# Patient Record
Sex: Female | Born: 1940 | Race: White | Hispanic: No | Marital: Married | Smoking: Never smoker
Health system: Southern US, Community
[De-identification: ages and names within clinical notes are randomized; demographics above are authoritative.]

## PROBLEM LIST (undated history)

## (undated) DIAGNOSIS — N159 Renal tubulo-interstitial disease, unspecified: Secondary | ICD-10-CM

## (undated) DIAGNOSIS — I10 Essential (primary) hypertension: Secondary | ICD-10-CM

## (undated) DIAGNOSIS — R55 Syncope and collapse: Secondary | ICD-10-CM

## (undated) DIAGNOSIS — H9190 Unspecified hearing loss, unspecified ear: Secondary | ICD-10-CM

## (undated) DIAGNOSIS — G500 Trigeminal neuralgia: Secondary | ICD-10-CM

## (undated) DIAGNOSIS — N12 Tubulo-interstitial nephritis, not specified as acute or chronic: Secondary | ICD-10-CM

## (undated) DIAGNOSIS — D751 Secondary polycythemia: Secondary | ICD-10-CM

## (undated) DIAGNOSIS — E785 Hyperlipidemia, unspecified: Secondary | ICD-10-CM

## (undated) DIAGNOSIS — F4323 Adjustment disorder with mixed anxiety and depressed mood: Secondary | ICD-10-CM

## (undated) DIAGNOSIS — T4145XA Adverse effect of unspecified anesthetic, initial encounter: Secondary | ICD-10-CM

## (undated) DIAGNOSIS — F32A Depression, unspecified: Secondary | ICD-10-CM

## (undated) DIAGNOSIS — F419 Anxiety disorder, unspecified: Secondary | ICD-10-CM

## (undated) DIAGNOSIS — T8859XA Other complications of anesthesia, initial encounter: Secondary | ICD-10-CM

## (undated) DIAGNOSIS — T887XXA Unspecified adverse effect of drug or medicament, initial encounter: Secondary | ICD-10-CM

## (undated) DIAGNOSIS — R7611 Nonspecific reaction to tuberculin skin test without active tuberculosis: Secondary | ICD-10-CM

## (undated) DIAGNOSIS — S0990XA Unspecified injury of head, initial encounter: Secondary | ICD-10-CM

## (undated) DIAGNOSIS — K219 Gastro-esophageal reflux disease without esophagitis: Secondary | ICD-10-CM

## (undated) DIAGNOSIS — K8020 Calculus of gallbladder without cholecystitis without obstruction: Secondary | ICD-10-CM

## (undated) DIAGNOSIS — K760 Fatty (change of) liver, not elsewhere classified: Secondary | ICD-10-CM

## (undated) DIAGNOSIS — D649 Anemia, unspecified: Secondary | ICD-10-CM

## (undated) DIAGNOSIS — C801 Malignant (primary) neoplasm, unspecified: Secondary | ICD-10-CM

## (undated) DIAGNOSIS — Z974 Presence of external hearing-aid: Secondary | ICD-10-CM

## (undated) HISTORY — DX: Calculus of gallbladder without cholecystitis without obstruction: K80.20

## (undated) HISTORY — PX: BACK SURGERY: SHX140

## (undated) HISTORY — PX: COLONOSCOPY: SHX174

## (undated) HISTORY — DX: Hyperlipidemia, unspecified: E78.5

## (undated) HISTORY — DX: Secondary polycythemia: D75.1

## (undated) HISTORY — DX: Tubulo-interstitial nephritis, not specified as acute or chronic: N12

## (undated) HISTORY — PX: OTHER SURGICAL HISTORY: SHX169

## (undated) HISTORY — DX: Syncope and collapse: R55

## (undated) HISTORY — DX: Essential (primary) hypertension: I10

## (undated) HISTORY — DX: Trigeminal neuralgia: G50.0

## (undated) HISTORY — DX: Nonspecific reaction to tuberculin skin test without active tuberculosis: R76.11

## (undated) HISTORY — PX: EYE SURGERY: SHX253

## (undated) HISTORY — DX: Unspecified adverse effect of drug or medicament, initial encounter: T88.7XXA

## (undated) HISTORY — DX: Unspecified injury of head, initial encounter: S09.90XA

## (undated) HISTORY — DX: Adjustment disorder with mixed anxiety and depressed mood: F43.23

## (undated) HISTORY — DX: Gastro-esophageal reflux disease without esophagitis: K21.9

## (undated) HISTORY — DX: Presence of external hearing-aid: Z97.4

---

## 1997-12-16 ENCOUNTER — Emergency Department (HOSPITAL_COMMUNITY): Admission: EM | Admit: 1997-12-16 | Discharge: 1997-12-16 | Payer: Self-pay | Admitting: Emergency Medicine

## 1998-05-23 HISTORY — PX: CARDIAC CATHETERIZATION: SHX172

## 2000-05-23 HISTORY — PX: ABDOMINAL HYSTERECTOMY: SHX81

## 2000-05-23 HISTORY — PX: OOPHORECTOMY: SHX86

## 2000-05-23 LAB — HM COLONOSCOPY: HM Colonoscopy: NORMAL

## 2001-05-02 ENCOUNTER — Encounter: Payer: Self-pay | Admitting: Internal Medicine

## 2001-05-06 ENCOUNTER — Emergency Department (HOSPITAL_COMMUNITY): Admission: EM | Admit: 2001-05-06 | Discharge: 2001-05-06 | Payer: Self-pay | Admitting: Emergency Medicine

## 2004-03-09 ENCOUNTER — Encounter: Payer: Self-pay | Admitting: Internal Medicine

## 2004-03-25 ENCOUNTER — Ambulatory Visit: Payer: Self-pay | Admitting: Internal Medicine

## 2004-10-21 ENCOUNTER — Ambulatory Visit: Payer: Self-pay | Admitting: Internal Medicine

## 2004-11-04 ENCOUNTER — Ambulatory Visit: Payer: Self-pay | Admitting: Internal Medicine

## 2004-11-05 ENCOUNTER — Ambulatory Visit: Payer: Self-pay | Admitting: Pulmonary Disease

## 2004-11-29 ENCOUNTER — Ambulatory Visit: Payer: Self-pay | Admitting: Pulmonary Disease

## 2005-01-14 ENCOUNTER — Ambulatory Visit: Payer: Self-pay | Admitting: Pulmonary Disease

## 2005-03-08 ENCOUNTER — Ambulatory Visit: Payer: Self-pay | Admitting: Internal Medicine

## 2006-02-19 ENCOUNTER — Emergency Department (HOSPITAL_COMMUNITY): Admission: EM | Admit: 2006-02-19 | Discharge: 2006-02-19 | Payer: Self-pay | Admitting: Family Medicine

## 2006-03-17 ENCOUNTER — Ambulatory Visit: Payer: Self-pay | Admitting: Internal Medicine

## 2006-03-17 LAB — CONVERTED CEMR LAB
ALT: 25 units/L (ref 0–40)
AST: 29 units/L (ref 0–37)
Albumin: 3.7 g/dL (ref 3.5–5.2)
Alkaline Phosphatase: 64 units/L (ref 39–117)
BUN: 23 mg/dL (ref 6–23)
Basophils Absolute: 0 10*3/uL (ref 0.0–0.1)
Basophils Relative: 0.2 % (ref 0.0–1.0)
CO2: 30 meq/L (ref 19–32)
Calcium: 9.1 mg/dL (ref 8.4–10.5)
Chloride: 104 meq/L (ref 96–112)
Chol/HDL Ratio, serum: 4.5
Cholesterol: 232 mg/dL (ref 0–200)
Creatinine, Ser: 1.1 mg/dL (ref 0.4–1.2)
Eosinophil percent: 6 % — ABNORMAL HIGH (ref 0.0–5.0)
GFR calc non Af Amer: 53 mL/min
Glomerular Filtration Rate, Af Am: 64 mL/min/{1.73_m2}
Glucose, Bld: 97 mg/dL (ref 70–99)
HCT: 49 % — ABNORMAL HIGH (ref 36.0–46.0)
HDL: 51.9 mg/dL (ref >39.0)
Hemoglobin: 16.3 g/dL — ABNORMAL HIGH (ref 12.0–15.0)
LDL DIRECT: 140.9 mg/dL
Lymphocytes Relative: 16.7 % (ref 12.0–46.0)
MCHC: 33.3 g/dL (ref 30.0–36.0)
MCV: 90.6 fL (ref 78.0–100.0)
Monocytes Absolute: 0.4 10*3/uL (ref 0.2–0.7)
Monocytes Relative: 6.8 % (ref 3.0–11.0)
Neutro Abs: 3.9 10*3/uL (ref 1.4–7.7)
Neutrophils Relative %: 70.3 % (ref 43.0–77.0)
Platelets: 212 10*3/uL (ref 150–400)
Potassium: 4 meq/L (ref 3.5–5.1)
RBC: 5.41 M/uL — ABNORMAL HIGH (ref 3.87–5.11)
RDW: 12.3 % (ref 11.5–14.6)
Sodium: 140 meq/L (ref 135–145)
TSH: 1.1 microintl units/mL (ref 0.35–5.50)
Total Bilirubin: 0.9 mg/dL (ref 0.3–1.2)
Total Protein: 6.2 g/dL (ref 6.0–8.3)
Triglyceride fasting, serum: 192 mg/dL — ABNORMAL HIGH (ref 0–149)
VLDL: 38 mg/dL (ref 0–40)
WBC: 5.5 10*3/uL (ref 4.5–10.5)

## 2006-03-22 ENCOUNTER — Ambulatory Visit: Payer: Self-pay | Admitting: Internal Medicine

## 2006-05-23 LAB — HM MAMMOGRAPHY: HM Mammogram: NORMAL

## 2006-07-26 ENCOUNTER — Encounter: Payer: Self-pay | Admitting: Internal Medicine

## 2006-12-06 ENCOUNTER — Encounter: Payer: Self-pay | Admitting: Internal Medicine

## 2006-12-06 ENCOUNTER — Encounter: Admission: RE | Admit: 2006-12-06 | Discharge: 2006-12-06 | Payer: Self-pay | Admitting: Internal Medicine

## 2006-12-25 DIAGNOSIS — I10 Essential (primary) hypertension: Secondary | ICD-10-CM | POA: Insufficient documentation

## 2006-12-25 DIAGNOSIS — E785 Hyperlipidemia, unspecified: Secondary | ICD-10-CM | POA: Insufficient documentation

## 2007-01-11 ENCOUNTER — Ambulatory Visit: Payer: Self-pay | Admitting: Internal Medicine

## 2007-01-11 DIAGNOSIS — R0602 Shortness of breath: Secondary | ICD-10-CM | POA: Insufficient documentation

## 2007-01-12 ENCOUNTER — Encounter: Payer: Self-pay | Admitting: Internal Medicine

## 2007-01-23 ENCOUNTER — Ambulatory Visit: Payer: Self-pay

## 2007-01-29 ENCOUNTER — Encounter: Payer: Self-pay | Admitting: Internal Medicine

## 2007-02-05 ENCOUNTER — Ambulatory Visit: Payer: Self-pay | Admitting: Internal Medicine

## 2007-02-05 DIAGNOSIS — R9081 Abnormal echoencephalogram: Secondary | ICD-10-CM | POA: Insufficient documentation

## 2007-02-08 ENCOUNTER — Telehealth: Payer: Self-pay | Admitting: Internal Medicine

## 2007-03-06 ENCOUNTER — Ambulatory Visit: Payer: Self-pay | Admitting: Cardiology

## 2007-03-07 ENCOUNTER — Ambulatory Visit: Payer: Self-pay

## 2007-03-15 ENCOUNTER — Telehealth: Payer: Self-pay | Admitting: Internal Medicine

## 2007-04-06 ENCOUNTER — Ambulatory Visit: Payer: Self-pay | Admitting: Internal Medicine

## 2007-04-06 DIAGNOSIS — M279 Disease of jaws, unspecified: Secondary | ICD-10-CM | POA: Insufficient documentation

## 2007-04-06 DIAGNOSIS — M1990 Unspecified osteoarthritis, unspecified site: Secondary | ICD-10-CM | POA: Insufficient documentation

## 2007-04-06 LAB — CONVERTED CEMR LAB
Bilirubin Urine: NEGATIVE
Blood in Urine, dipstick: NEGATIVE
Glucose, Urine, Semiquant: NEGATIVE
Ketones, urine, test strip: NEGATIVE
Nitrite: NEGATIVE
Protein, U semiquant: NEGATIVE
Specific Gravity, Urine: 1.02
Urobilinogen, UA: 0.2
WBC Urine, dipstick: NEGATIVE
pH: 7

## 2007-04-10 ENCOUNTER — Ambulatory Visit: Payer: Self-pay | Admitting: Cardiology

## 2007-04-11 LAB — CONVERTED CEMR LAB: Vit D, 1,25-Dihydroxy: 28 — ABNORMAL LOW (ref 30–89)

## 2007-04-12 LAB — CONVERTED CEMR LAB
ALT: 40 units/L — ABNORMAL HIGH (ref 0–35)
AST: 38 units/L — ABNORMAL HIGH (ref 0–37)
Albumin: 4.1 g/dL (ref 3.5–5.2)
Alkaline Phosphatase: 66 units/L (ref 39–117)
BUN: 21 mg/dL (ref 6–23)
Basophils Absolute: 0 10*3/uL (ref 0.0–0.1)
Basophils Relative: 0.2 % (ref 0.0–1.0)
Bilirubin, Direct: 0.2 mg/dL (ref 0.0–0.3)
CO2: 31 meq/L (ref 19–32)
Calcium: 9.7 mg/dL (ref 8.4–10.5)
Chloride: 103 meq/L (ref 96–112)
Cholesterol: 304 mg/dL (ref 0–200)
Creatinine, Ser: 0.9 mg/dL (ref 0.4–1.2)
Direct LDL: 225.5 mg/dL
Eosinophils Absolute: 0.3 10*3/uL (ref 0.0–0.6)
Eosinophils Relative: 4.6 % (ref 0.0–5.0)
GFR calc Af Amer: 80 mL/min
GFR calc non Af Amer: 66 mL/min
Glucose, Bld: 87 mg/dL (ref 70–99)
HCT: 50.1 % — ABNORMAL HIGH (ref 36.0–46.0)
HDL: 69.5 mg/dL (ref >39.0)
Hemoglobin: 17.3 g/dL — ABNORMAL HIGH (ref 12.0–15.0)
Lymphocytes Relative: 20.4 % (ref 12.0–46.0)
MCHC: 34.5 g/dL (ref 30.0–36.0)
MCV: 89.7 fL (ref 78.0–100.0)
Monocytes Absolute: 0.4 10*3/uL (ref 0.2–0.7)
Monocytes Relative: 6.8 % (ref 3.0–11.0)
Neutro Abs: 4.3 10*3/uL (ref 1.4–7.7)
Neutrophils Relative %: 68 % (ref 43.0–77.0)
Platelets: 201 10*3/uL (ref 150–400)
Potassium: 4.1 meq/L (ref 3.5–5.1)
RBC: 5.59 M/uL — ABNORMAL HIGH (ref 3.87–5.11)
RDW: 12.8 % (ref 11.5–14.6)
Sodium: 142 meq/L (ref 135–145)
TSH: 1.36 microintl units/mL (ref 0.35–5.50)
Total Bilirubin: 0.9 mg/dL (ref 0.3–1.2)
Total CHOL/HDL Ratio: 4.4
Total Protein: 6.8 g/dL (ref 6.0–8.3)
Triglycerides: 99 mg/dL (ref 0–149)
VLDL: 20 mg/dL (ref 0–40)
WBC: 6.3 10*3/uL (ref 4.5–10.5)

## 2007-04-18 ENCOUNTER — Telehealth: Payer: Self-pay | Admitting: *Deleted

## 2007-05-07 ENCOUNTER — Telehealth (INDEPENDENT_AMBULATORY_CARE_PROVIDER_SITE_OTHER): Payer: Self-pay | Admitting: *Deleted

## 2007-05-21 ENCOUNTER — Telehealth: Payer: Self-pay | Admitting: Internal Medicine

## 2007-05-24 HISTORY — PX: DOPPLER ECHOCARDIOGRAPHY: SHX263

## 2007-05-30 ENCOUNTER — Ambulatory Visit: Payer: Self-pay | Admitting: Internal Medicine

## 2007-05-30 LAB — CONVERTED CEMR LAB
ALT: 26 units/L (ref 0–35)
AST: 29 units/L (ref 0–37)
Albumin: 4 g/dL (ref 3.5–5.2)
Alkaline Phosphatase: 53 units/L (ref 39–117)
Bilirubin, Direct: 0.2 mg/dL (ref 0.0–0.3)
Cholesterol: 173 mg/dL (ref 0–200)
HDL: 71.7 mg/dL (ref >39.0)
LDL Cholesterol: 88 mg/dL (ref 0–99)
Total Bilirubin: 1 mg/dL (ref 0.3–1.2)
Total CHOL/HDL Ratio: 2.4
Total Protein: 6.3 g/dL (ref 6.0–8.3)
Triglycerides: 68 mg/dL (ref 0–149)
VLDL: 14 mg/dL (ref 0–40)

## 2007-06-05 ENCOUNTER — Ambulatory Visit: Payer: Self-pay | Admitting: Internal Medicine

## 2007-06-05 DIAGNOSIS — R51 Headache: Secondary | ICD-10-CM | POA: Insufficient documentation

## 2007-06-05 DIAGNOSIS — R519 Headache, unspecified: Secondary | ICD-10-CM | POA: Insufficient documentation

## 2007-06-05 LAB — CONVERTED CEMR LAB
Cholesterol, target level: 200 mg/dL
HDL goal, serum: 40 mg/dL
LDL Goal: 160 mg/dL

## 2007-10-21 ENCOUNTER — Emergency Department (HOSPITAL_COMMUNITY): Admission: EM | Admit: 2007-10-21 | Discharge: 2007-10-22 | Payer: Self-pay | Admitting: Emergency Medicine

## 2007-11-08 ENCOUNTER — Telehealth: Payer: Self-pay | Admitting: Internal Medicine

## 2008-02-18 ENCOUNTER — Ambulatory Visit: Payer: Self-pay | Admitting: Internal Medicine

## 2008-02-18 DIAGNOSIS — R1011 Right upper quadrant pain: Secondary | ICD-10-CM | POA: Insufficient documentation

## 2008-02-18 LAB — CONVERTED CEMR LAB
Bilirubin Urine: NEGATIVE
Blood in Urine, dipstick: NEGATIVE
Glucose, Urine, Semiquant: NEGATIVE
Ketones, urine, test strip: NEGATIVE
Nitrite: NEGATIVE
Protein, U semiquant: NEGATIVE
Specific Gravity, Urine: 1.015
Urobilinogen, UA: 0.2
WBC Urine, dipstick: NEGATIVE
pH: 6

## 2008-02-20 ENCOUNTER — Encounter: Admission: RE | Admit: 2008-02-20 | Discharge: 2008-02-20 | Payer: Self-pay | Admitting: Internal Medicine

## 2008-02-25 ENCOUNTER — Ambulatory Visit: Payer: Self-pay | Admitting: Internal Medicine

## 2008-02-25 LAB — CONVERTED CEMR LAB
ALT: 29 units/L (ref 0–35)
AST: 29 units/L (ref 0–37)
Albumin: 4 g/dL (ref 3.5–5.2)
Alkaline Phosphatase: 65 units/L (ref 39–117)
BUN: 21 mg/dL (ref 6–23)
Basophils Absolute: 0 10*3/uL (ref 0.0–0.1)
Basophils Relative: 0.8 % (ref 0.0–3.0)
Bilirubin Urine: NEGATIVE
Bilirubin, Direct: 0.1 mg/dL (ref 0.0–0.3)
Blood in Urine, dipstick: NEGATIVE
CO2: 30 meq/L (ref 19–32)
Calcium: 9.1 mg/dL (ref 8.4–10.5)
Chloride: 101 meq/L (ref 96–112)
Cholesterol: 171 mg/dL (ref 0–200)
Creatinine, Ser: 1.1 mg/dL (ref 0.4–1.2)
Eosinophils Absolute: 0.2 10*3/uL (ref 0.0–0.7)
Eosinophils Relative: 4.2 % (ref 0.0–5.0)
GFR calc Af Amer: 64 mL/min
GFR calc non Af Amer: 52 mL/min
Glucose, Bld: 105 mg/dL — ABNORMAL HIGH (ref 70–99)
Glucose, Urine, Semiquant: NEGATIVE
HCT: 49.8 % — ABNORMAL HIGH (ref 36.0–46.0)
HDL: 64.1 mg/dL (ref >39.0)
Hemoglobin: 17.3 g/dL — ABNORMAL HIGH (ref 12.0–15.0)
Ketones, urine, test strip: NEGATIVE
LDL Cholesterol: 92 mg/dL (ref 0–99)
Lymphocytes Relative: 20.5 % (ref 12.0–46.0)
MCHC: 34.6 g/dL (ref 30.0–36.0)
MCV: 89.6 fL (ref 78.0–100.0)
Monocytes Absolute: 0.5 10*3/uL (ref 0.1–1.0)
Monocytes Relative: 8.7 % (ref 3.0–12.0)
Neutro Abs: 3.4 10*3/uL (ref 1.4–7.7)
Neutrophils Relative %: 65.8 % (ref 43.0–77.0)
Nitrite: NEGATIVE
Platelets: 215 10*3/uL (ref 150–400)
Potassium: 3.9 meq/L (ref 3.5–5.1)
Protein, U semiquant: NEGATIVE
RBC: 5.56 M/uL — ABNORMAL HIGH (ref 3.87–5.11)
RDW: 12.6 % (ref 11.5–14.6)
Sodium: 140 meq/L (ref 135–145)
Specific Gravity, Urine: 1.015
TSH: 0.87 microintl units/mL (ref 0.35–5.50)
Total Bilirubin: 0.7 mg/dL (ref 0.3–1.2)
Total CHOL/HDL Ratio: 2.7
Total Protein: 6.5 g/dL (ref 6.0–8.3)
Triglycerides: 77 mg/dL (ref 0–149)
Urobilinogen, UA: 0.2
VLDL: 15 mg/dL (ref 0–40)
WBC Urine, dipstick: NEGATIVE
WBC: 5.2 10*3/uL (ref 4.5–10.5)
pH: 7

## 2008-02-29 ENCOUNTER — Encounter: Admission: RE | Admit: 2008-02-29 | Discharge: 2008-02-29 | Payer: Self-pay | Admitting: Internal Medicine

## 2008-03-19 ENCOUNTER — Ambulatory Visit: Payer: Self-pay | Admitting: Internal Medicine

## 2008-03-19 DIAGNOSIS — G4481 Hypnic headache: Secondary | ICD-10-CM | POA: Insufficient documentation

## 2008-03-19 DIAGNOSIS — K8010 Calculus of gallbladder with chronic cholecystitis without obstruction: Secondary | ICD-10-CM | POA: Insufficient documentation

## 2008-03-28 ENCOUNTER — Encounter: Payer: Self-pay | Admitting: Internal Medicine

## 2008-05-07 ENCOUNTER — Ambulatory Visit: Payer: Self-pay | Admitting: Internal Medicine

## 2008-05-07 DIAGNOSIS — R0989 Other specified symptoms and signs involving the circulatory and respiratory systems: Secondary | ICD-10-CM | POA: Insufficient documentation

## 2008-05-07 DIAGNOSIS — R0609 Other forms of dyspnea: Secondary | ICD-10-CM | POA: Insufficient documentation

## 2008-07-23 ENCOUNTER — Encounter: Payer: Self-pay | Admitting: Internal Medicine

## 2008-09-03 ENCOUNTER — Encounter: Payer: Self-pay | Admitting: Internal Medicine

## 2009-01-28 ENCOUNTER — Telehealth: Payer: Self-pay | Admitting: *Deleted

## 2009-02-27 ENCOUNTER — Ambulatory Visit: Payer: Self-pay | Admitting: Internal Medicine

## 2009-06-30 ENCOUNTER — Telehealth: Payer: Self-pay | Admitting: *Deleted

## 2009-08-10 ENCOUNTER — Telehealth: Payer: Self-pay | Admitting: *Deleted

## 2009-08-18 ENCOUNTER — Telehealth: Payer: Self-pay | Admitting: Internal Medicine

## 2009-08-19 ENCOUNTER — Encounter: Payer: Self-pay | Admitting: Internal Medicine

## 2009-09-12 ENCOUNTER — Emergency Department (HOSPITAL_COMMUNITY): Admission: EM | Admit: 2009-09-12 | Discharge: 2009-09-12 | Payer: Self-pay | Admitting: Family Medicine

## 2009-09-21 ENCOUNTER — Ambulatory Visit: Payer: Self-pay | Admitting: Internal Medicine

## 2009-09-21 DIAGNOSIS — R229 Localized swelling, mass and lump, unspecified: Secondary | ICD-10-CM | POA: Insufficient documentation

## 2009-10-16 ENCOUNTER — Telehealth: Payer: Self-pay | Admitting: Internal Medicine

## 2009-11-05 ENCOUNTER — Ambulatory Visit: Payer: Self-pay | Admitting: Internal Medicine

## 2009-11-16 LAB — CONVERTED CEMR LAB
ALT: 29 units/L (ref 0–35)
AST: 37 units/L (ref 0–37)
Albumin: 4.3 g/dL (ref 3.5–5.2)
Alkaline Phosphatase: 86 units/L (ref 39–117)
BUN: 20 mg/dL (ref 6–23)
Basophils Absolute: 0 10*3/uL (ref 0.0–0.1)
Basophils Relative: 0.6 % (ref 0.0–3.0)
Bilirubin, Direct: 0.1 mg/dL (ref 0.0–0.3)
CO2: 31 meq/L (ref 19–32)
Calcium: 9.6 mg/dL (ref 8.4–10.5)
Chloride: 105 meq/L (ref 96–112)
Cholesterol: 203 mg/dL — ABNORMAL HIGH (ref 0–200)
Creatinine, Ser: 0.9 mg/dL (ref 0.4–1.2)
Direct LDL: 89.1 mg/dL
Eosinophils Absolute: 0.3 10*3/uL (ref 0.0–0.7)
Eosinophils Relative: 4.7 % (ref 0.0–5.0)
GFR calc non Af Amer: 65.73 mL/min (ref >60)
Glucose, Bld: 100 mg/dL — ABNORMAL HIGH (ref 70–99)
HCT: 48.5 % — ABNORMAL HIGH (ref 36.0–46.0)
HDL: 82.2 mg/dL (ref >39.00)
Hemoglobin: 16.7 g/dL — ABNORMAL HIGH (ref 12.0–15.0)
Lymphocytes Relative: 13.5 % (ref 12.0–46.0)
Lymphs Abs: 0.8 10*3/uL (ref 0.7–4.0)
MCHC: 34.5 g/dL (ref 30.0–36.0)
MCV: 91.6 fL (ref 78.0–100.0)
Monocytes Absolute: 0.4 10*3/uL (ref 0.1–1.0)
Monocytes Relative: 6.7 % (ref 3.0–12.0)
Neutro Abs: 4.2 10*3/uL (ref 1.4–7.7)
Neutrophils Relative %: 74.5 % (ref 43.0–77.0)
Platelets: 186 10*3/uL (ref 150.0–400.0)
Potassium: 4 meq/L (ref 3.5–5.1)
RBC: 5.29 M/uL — ABNORMAL HIGH (ref 3.87–5.11)
RDW: 13.8 % (ref 11.5–14.6)
Sodium: 143 meq/L (ref 135–145)
TSH: 0.56 microintl units/mL (ref 0.35–5.50)
Total Bilirubin: 1 mg/dL (ref 0.3–1.2)
Total CHOL/HDL Ratio: 2
Total Protein: 6.3 g/dL (ref 6.0–8.3)
Triglycerides: 125 mg/dL (ref 0.0–149.0)
VLDL: 25 mg/dL (ref 0.0–40.0)
WBC: 5.6 10*3/uL (ref 4.5–10.5)

## 2009-11-19 ENCOUNTER — Telehealth: Payer: Self-pay | Admitting: Internal Medicine

## 2009-12-07 ENCOUNTER — Telehealth: Payer: Self-pay | Admitting: *Deleted

## 2010-03-03 ENCOUNTER — Ambulatory Visit: Payer: Self-pay | Admitting: Internal Medicine

## 2010-03-03 DIAGNOSIS — F4323 Adjustment disorder with mixed anxiety and depressed mood: Secondary | ICD-10-CM

## 2010-03-03 HISTORY — DX: Adjustment disorder with mixed anxiety and depressed mood: F43.23

## 2010-06-20 LAB — CONVERTED CEMR LAB
BUN: 20 mg/dL (ref 6–23)
Basophils Absolute: 0 10*3/uL (ref 0.0–0.1)
Basophils Relative: 0.8 % (ref 0.0–1.0)
CO2: 28 meq/L (ref 19–32)
Calcium: 9.4 mg/dL (ref 8.4–10.5)
Chloride: 105 meq/L (ref 96–112)
Creatinine, Ser: 1 mg/dL (ref 0.4–1.2)
Eosinophils Absolute: 0.2 10*3/uL (ref 0.0–0.6)
Eosinophils Relative: 4.2 % (ref 0.0–5.0)
GFR calc Af Amer: 71 mL/min
GFR calc non Af Amer: 59 mL/min
Glucose, Bld: 124 mg/dL — ABNORMAL HIGH (ref 70–99)
HCT: 45.9 % (ref 36.0–46.0)
Hemoglobin: 16 g/dL — ABNORMAL HIGH (ref 12.0–15.0)
Lymphocytes Relative: 19.4 % (ref 12.0–46.0)
MCHC: 34.8 g/dL (ref 30.0–36.0)
MCV: 87.4 fL (ref 78.0–100.0)
Monocytes Absolute: 0.5 10*3/uL (ref 0.2–0.7)
Monocytes Relative: 8.7 % (ref 3.0–11.0)
Neutro Abs: 3.5 10*3/uL (ref 1.4–7.7)
Neutrophils Relative %: 66.9 % (ref 43.0–77.0)
Platelets: 218 10*3/uL (ref 150–400)
Potassium: 3.9 meq/L (ref 3.5–5.1)
Pro B Natriuretic peptide (BNP): 58 pg/mL (ref 0.0–100.0)
RBC: 5.25 M/uL — ABNORMAL HIGH (ref 3.87–5.11)
RDW: 12.2 % (ref 11.5–14.6)
Sodium: 140 meq/L (ref 135–145)
TSH: 0.54 microintl units/mL (ref 0.35–5.50)
WBC: 5.2 10*3/uL (ref 4.5–10.5)

## 2010-06-24 NOTE — Assessment & Plan Note (Signed)
Summary: painful mass on arm/dm   Vital Signs:  Patient profile:   70 year old female Menstrual status:  hysterectomy Weight:      170 pounds Temp:     98.2 degrees F oral Pulse rate:   66 / minute BP sitting:   120 / 80  (right arm) Cuff size:   regular  Vitals Entered By: Sherron Monday, CMA (AAMA) (Sep 21, 2009 1:28 PM) CC: Knot on rt arm that has been there for 1 1/2 weeks. Pt was seen at [facility] no broken bones. It was alot bigger and now it is affecting wrist and hand making it sore.   History of Present Illness: [name] comesin comes in today  for  above  sda. Acute onset without  trauma?  of bruising and large ammt of bleeding  on right  forearm        and was  actually seen  at ED 10 days ago and x ray was negative  and has Decreased in size  a bit.   but not going away and is now tender more than before  and  tender inside of wrist.      no numbness or weakness .  Has arthritis and  no previous masses .    Preventive Screening-Counseling & Management  Alcohol-Tobacco     Alcohol drinks/day: <1     Alcohol type: wine     Smoking Status: never  Caffeine-Diet-Exercise     Caffeine use/day: 1     Does Patient Exercise: no  Current Medications (verified): 1)  Norvasc 5 Mg  Tabs (Amlodipine Besylate) .Marland Kitchen.. 1 By Mouth Once Daily 2)  Zoloft 100 Mg  Tabs (Sertraline Hcl) .Marland Kitchen.. 1 By Mouth Once Daily 3)  Celebrex 200 Mg  Caps (Celecoxib) .... Take 1 Capsule Once Daily 4)  Premarin 0.3 Mg  Tabs (Estrogens Conjugated) .Marland Kitchen.. 1 By Mouth Once Daily 5)  Maxzide-25 37.5-25 Mg  Tabs (Triamterene-Hctz) .Marland Kitchen.. 1 By Mouth Once Daily 6)  Crestor 10 Mg  Tabs (Rosuvastatin Calcium) .Marland Kitchen.. 1 By Mouth Once Daily 7)  Fiorinal/codeine #3 50-325-40-30 Mg  Caps (Butalbital-Asa-Caff-Codeine) .... Take 1-2 By Mouth Every 4-6 Hours As Needed. Max of 6 Tablets in 24 Hours. 8)  Tegretol 200 Mg Tabs (Carbamazepine) .Marland Kitchen.. 1 Three Times A Day 9)  Symbicort 80-4.5 Mcg/act  Aero  (Budesonide-Formoterol Fumarate) .... Two Puffs Every 12 Hours 10)  Albuterol 90 Mcg/act  Aers (Albuterol) .Marland Kitchen.. 1 Puff Every 4-6 Hours As Needed 11)  Omeprazole-Sodium Bicarbonate 40-1100 Mg Caps (Omeprazole-Sodium Bicarbonate) .Marland Kitchen.. 1 By Mouth Once Daily  Allergies (verified): 1)  ! Bactrim (Sulfamethoxazole-Trimethoprim)  Past History:  Past medical, surgical, family and social histories (including risk factors) reviewed, and no changes noted (except as noted below).  Past Medical History: Reviewed history from 02/27/2009 and no changes required. 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 Hysterectomy, tubal 2002 chest pains neg cath  2000 r shoulder surgery   Past History:  Care Management: Neurology: [facility]  Family History: Reviewed history from 02/27/2009 and no changes required. Family History of Alcoholism/Addiction fa Family History Hypertension Family History Ovarian cancer mom Family History of Stroke M 1st degree relative parents 22's  mom had gallbladder out  when middle age  Daughter newly diagnosed with seizures.  under evaluation  Social History: Reviewed history from 05/07/2008 and no changes required. Never Smoked Married hh of 2  -3  ( god daughter)  pets  2 dogs   ETOH- twice a wk  Review of Systems  The patient denies anorexia, fever, unusual weight change, enlarged lymph nodes, angioedema, melena, hematochezia, and hematuria.         no joint swelling or numbness  Physical Exam  General:  Well-developed,well-nourished,in no acute distress; alert,appropriate and cooperative throughout examination Head:  normocephalic and atraumatic.  normocephalic and atraumatic.   Msk:   no joint warmth and no redness over joints.   Pulses:  pulses intact without delay   Extremities:  right extremity arm with  3 cm plus   cystic feeling and mildy tender but  bluish color      and darker but no bruise    tender flexor surface of wrist .   Neurologic:  grossly non focal  Skin:  turgor normal and color normal.  ocass bruise  Cervical Nodes:  No lymphadenopathy noted Psych:  Oriented X3, good eye contact, not anxious appearing, and not depressed appearing.   Reviewed  ed note  Impression & Recommendations:  Problem # 1:  LOCALIZED SUPERFICIAL SWELLING MASS OR LUMP (ICD-782.2) ?    atypical presentation for a ganglion and    has wrist pain with this .   no infection  .   ? if could be cystic traumatic  cause but no hx of trauma and no previous  mass.   Pt is hesitant . I don not think I should not attemtp to aspiroat this based on the presentation  and hbruising seen.  Orders: Orthopedic Surgeon Referral (Ortho Surgeon) hx of bleeding from    a  skin bx but on celebrex    .   Complete Medication List: 1)  Norvasc 5 Mg Tabs (Amlodipine besylate) .Marland Kitchen.. 1 by mouth once daily 2)  Zoloft 100 Mg Tabs (Sertraline hcl) .Marland Kitchen.. 1 by mouth once daily 3)  Celebrex 200 Mg Caps (Celecoxib) .... Take 1 capsule once daily 4)  Premarin 0.3 Mg Tabs (Estrogens conjugated) .Marland Kitchen.. 1 by mouth once daily 5)  Maxzide-25 37.5-25 Mg Tabs (Triamterene-hctz) .Marland Kitchen.. 1 by mouth once daily 6)  Crestor 10 Mg Tabs (Rosuvastatin calcium) .Marland Kitchen.. 1 by mouth once daily 7)  Fiorinal/codeine #3 50-325-40-30 Mg Caps (Butalbital-asa-caff-codeine) .... Take 1-2 by mouth every 4-6 hours as needed. max of 6 tablets in 24 hours. 8)  Tegretol 200 Mg Tabs (Carbamazepine) .Marland Kitchen.. 1 three times a day 9)  Symbicort 80-4.5 Mcg/act Aero (Budesonide-formoterol fumarate) .... Two puffs every 12 hours 10)  Albuterol 90 Mcg/act Aers (Albuterol) .Marland Kitchen.. 1 puff every 4-6 hours as needed 11)  Omeprazole-sodium Bicarbonate 40-1100 Mg Caps  (Omeprazole-sodium bicarbonate) .Marland Kitchen.. 1 by mouth once daily  Patient Instructions: 1)  get consult  from  hand  specialist

## 2010-06-24 NOTE — Progress Notes (Signed)
Summary: Office Visit  Office Visit   Imported By: Jamelle Haring 01/12/2007 09:45:57  _____________________________________________________________________  External Attachment:    Type:   Image     Comment:   office note

## 2010-06-24 NOTE — Assessment & Plan Note (Signed)
Summary: follow up/ssc/pt rescd/ccm   Vital Signs:  Patient Profile:   70 Years Old Female Height:     65.5 inches Weight:      167 pounds Pulse rate:   80 / minute BP sitting:   100 / 60  (right arm) Cuff size:   regular  Vitals Entered By: Sherron Monday, CMA (June 05, 2007 3:16 PM)                 Chief Complaint:  Follow up .  History of Present Illness: [name] is here for a follow up on labs. Pt needs a refill on Fiorinal with Codeine.Uses for HA as needed with stress usues at most 2 bottles per year.   Shooting pain r jaw sometimes teeth hurt.Still present    to see neurologist soon. Feb 12.   On crestor without side effect   Lipid Management History:      Positive NCEP/ATP III risk factors include female age 43 years old or older and hypertension.  Negative NCEP/ATP III risk factors include no history of early menopause without estrogen hormone replacement, non-diabetic, HDL cholesterol greater than 60, non-tobacco-user status, no ASHD (atherosclerotic heart disease), no prior stroke/TIA, no peripheral vascular disease, and no history of aortic aneurysm.        The patient denies any symptoms to suggest myopathy or liver disease from her "statin" therapy.     Current Allergies (reviewed today): ! BACTRIM (SULFAMETHOXAZOLE-TRIMETHOPRIM)  Past Medical History:    Reviewed history from 04/06/2007 and no changes required:       GERD       Hyperlipidemia       Hypertension       g2p2       ppd when young       Headache migraine and hypnic HA eval by Dr Earley Favor in the past  Past Surgical History:    Hysterectomy, tubal 2002    chest pains neg cath  2000    r shoulder surgery    Social History:    Never Smoked    Married   Risk Factors:  Tobacco use:  never   Review of Systems       no change   Physical Exam  General:     alert, well-developed, well-nourished, and well-hydrated.   Additional Exam:     see labs ldl under 100 down  from over 200.    Impression & Recommendations:  Problem # 1:  HYPERLIPIDEMIA (KAJ-681.4)  Her updated medication list for this problem includes:    Crestor 10 Mg Tabs (Rosuvastatin calcium) .Marland Kitchen... 1 by mouth once daily  Labs Reviewed: Chol: 173 (05/30/2007)   HDL: 71.7 (05/30/2007)   LDL: 88 (05/30/2007)   TG: 68 (05/30/2007) SGOT: 29 (05/30/2007)   SGPT: 26 (05/30/2007)  Lipid Goals: Chol Goal: 200 (06/05/2007)   HDL Goal: 40 (06/05/2007)   LDL Goal: 160 (06/05/2007)   TG Goal: 150 (06/05/2007)  10 Yr Risk Heart Disease: 3 %   Problem # 2:  JAW PAIN (ICD-526.9) to neuro disc potential options  Problem # 3:  HEADACHE (ICD-784.0) rare to ocassional refill med x 1. Her updated medication list for this problem includes:    Celebrex 200 Mg Caps (Celecoxib) .Marland Kitchen... Take 1 capsule once daily    Fiorinal/codeine #3 50-325-40-30 Mg Caps (Butalbital-asa-caff-codeine) .Marland Kitchen... Take 1-2 by mouth every 4-6 hours as needed. max of 6 tablets in 24 hours.   Problem # 4:  HYPERTENSION (ICD-401.9) stabel Her  updated medication list for this problem includes:    Norvasc 5 Mg Tabs (Amlodipine besylate) .Marland Kitchen... 1 by mouth once daily    Maxzide-25 37.5-25 Mg Tabs (Triamterene-hctz) .Marland Kitchen... 1 by mouth once daily  BP today: 100/60 Prior BP: 104/60 (04/06/2007)  10 Yr Risk Heart Disease: 3 %  Labs Reviewed: Creat: 0.9 (04/06/2007) Chol: 173 (05/30/2007)   HDL: 71.7 (05/30/2007)   LDL: 88 (05/30/2007)   TG: 68 (05/30/2007)   Problem # 5:  GERD (ICD-530.81) stable  Her updated medication list for this problem includes:    Zegerid 40-1100 Mg Caps (Omeprazole-sodium bicarbonate) .Marland Kitchen... 1 by mouth once daily   Complete Medication List: 1)  Norvasc 5 Mg Tabs (Amlodipine besylate) .Marland Kitchen.. 1 by mouth once daily 2)  Zoloft 100 Mg Tabs (Sertraline hcl) .Marland Kitchen.. 1 by mouth once daily 3)  Celebrex 200 Mg Caps (Celecoxib) .... Take 1 capsule once daily 4)  Zegerid 40-1100 Mg Caps (Omeprazole-sodium bicarbonate)  .Marland Kitchen.. 1 by mouth once daily 5)  Ambien 10 Mg Tabs (Zolpidem tartrate) .... As needed 6)  Advair Diskus 100-50 Mcg/dose Misc (Fluticasone-salmeterol) .Marland Kitchen.. 1 puff two times a day 7)  Albuterol 90 Mcg/act Aers (Albuterol) .Marland Kitchen.. 1 puff every 4-6 hours prn 8)  Premarin 0.3 Mg Tabs (Estrogens conjugated) .Marland Kitchen.. 1 by mouth once daily 9)  Maxzide-25 37.5-25 Mg Tabs (Triamterene-hctz) .Marland Kitchen.. 1 by mouth once daily 10)  Crestor 10 Mg Tabs (Rosuvastatin calcium) .Marland Kitchen.. 1 by mouth once daily 11)  Fiorinal/codeine #3 50-325-40-30 Mg Caps (Butalbital-asa-caff-codeine) .... Take 1-2 by mouth every 4-6 hours as needed. max of 6 tablets in 24 hours.  Lipid Assessment/Plan:      Based on NCEP/ATP III, the patient's risk factor category is "0-1 risk factors".  From this information, the patient's calculated lipid goals are as follows: Total cholesterol goal is 200; LDL cholesterol goal is 160; HDL cholesterol goal is 40; Triglyceride goal is 150.     Patient Instructions: 1)  continue meds and rov at yearly check with labs in the fall or as needed.    Prescriptions: FIORINAL/CODEINE #3 50-325-40-30 MG  CAPS (BUTALBITAL-ASA-CAFF-CODEINE) Take 1-2 by mouth every 4-6 hours as needed. Max of 6 tablets in 24 hours.  #30 x 0   Entered and Authorized by:   [name] MD   Signed by:   [name] MD on 06/05/2007   Method used:   Print then Give to Patient   RxID:   [id number]  ]

## 2010-06-24 NOTE — Letter (Signed)
Summary: [facility]  [facility]   Imported By: Laural Benes 09/05/2008 15:33:11  _____________________________________________________________________  External Attachment:    Type:   Image     Comment:   External Document

## 2010-06-24 NOTE — Progress Notes (Signed)
Summary: refill on zegrid  Phone Note From Pharmacy   Caller: [place] [phone]* Reason for Call: Needs renewal Details for Reason: Zegerid Initial call taken by: Sherron Monday, [place],  May 21, 2007 11:36 AM  Follow-up for Phone Call        ok x 2. Sent thru EMR. Follow-up by: Sherron Monday, CMA,  May 21, 2007 11:37 AM      Prescriptions: ZEGERID 40-1100 MG  CAPS (OMEPRAZOLE-SODIUM BICARBONATE) 1 by mouth once daily  #30 x 1   Entered by:   Sherron Monday, CMA   Authorized by:   [name] MD   Signed by:   Sherron Monday, CMA on 05/21/2007   Method used:   Electronically sent to ...       [facility]       [place], [place]  [address]       Ph: [phone]       Fax: [phone]   RxID:   [id number]

## 2010-06-24 NOTE — Progress Notes (Signed)
Summary: refill on zoloft  Phone Note From Pharmacy   Caller: [place]* Summary of Call: Needs refills on Sertraline 122m  Follow-up for Phone Call        Sent thru EMR #30 w/ 3 refills. Follow-up by: SSherron Monday CMA,  February 08, 2007 12:30 PM      Prescriptions: ZOLOFT 100 MG  TABS (SERTRALINE HCL) 1 by mouth once daily  #30 x 3   Entered by:   SSherron Monday CMA   Authorized by:   WBurnis MedinMD   Signed by:   SSherron Monday CMA on 02/08/2007   Method used:   Electronically sent to ...       [facility]      [place]   [address]      Ph: [phone] [phone]      Fax: [phone]  RxID:   [id number]

## 2010-06-24 NOTE — Progress Notes (Signed)
Summary: rx  Phone Note Call from Patient Call back at Home Phone [phone]   Caller: pt live Call For: [name]  Summary of Call: poison ivy would like steroid pack called in  to [facility]  Initial call taken by: Shanon Payor,  November 08, 2007 8:48 AM  Follow-up for Phone Call        Spoke to pt-needs an ov before we can call in a steriod pack. I offered her an appt for this afternoon or tomorrow but pt states that she was busy and would call us back if she gets worse and needs to be seen. Follow-up by: Sherron Monday, [place],  November 08, 2007 9:55 AM

## 2010-06-24 NOTE — Medication Information (Signed)
Summary: [facility]-Zegerid/[employer]  [facility]-Zegerid/[employer]   Imported By: Laural Benes 08/05/2008 13:52:40  _____________________________________________________________________  External Attachment:    Type:   Image     Comment:   External Document

## 2010-06-24 NOTE — Progress Notes (Signed)
Summary: returning cindy call  Phone Note Call from Patient Call back at Home Phone [phone]   Caller: Patient Call For: dr fry Reason for Call: Talk to Nurse Summary of Call: pt is returning cindy call Initial call taken by: Glo Herring,  March 15, 2007 8:42 AM  Follow-up for Phone Call        i did not call this patient  Follow-up by: Townsend Roger, [place],  March 15, 2007 10:23 AM  Additional Follow-up for Phone Call Additional follow up Details #1::        Pt aware that no one at this office called her. She said that maybe it was the [facility]. She is going to call them. Additional Follow-up by: Sherron Monday, [place],  March 15, 2007 10:29 AM

## 2010-06-24 NOTE — Assessment & Plan Note (Signed)
Summary: Results on PT's ECHO  Pt is aware of results. ..................................................................Marland KitchenSherron Monday, CMA  January 29, 2007 1:07 PM

## 2010-06-24 NOTE — Progress Notes (Signed)
Summary: refill  Phone Note From Pharmacy   Caller: [facility] Reason for Call: Needs renewal Details for Reason: crestor Summary of Call: Pt needs to schedule a follow up appt before next refill Initial call taken by: Sherron Monday, [place] Deborra Medina),  June 30, 2009 5:23 PM  Follow-up for Phone Call        rx sent electronically Follow-up by: Sherron Monday, CMA Deborra Medina),  June 30, 2009 5:23 PM    Prescriptions: CRESTOR 10 MG  TABS (ROSUVASTATIN CALCIUM) 1 by mouth once daily  #90 x 0   Entered by:   Sherron Monday, CMA (AAMA)   Authorized by:   [name] MD   Signed by:   Sherron Monday, CMA (AAMA) on 06/30/2009   Method used:   Electronically to        [facility] (mail-order)             ,          Ph: [phone]       Fax: [phone]   RxID:   [id number]

## 2010-06-24 NOTE — Progress Notes (Signed)
Summary: refill on zegerid  Phone Note From Pharmacy   Caller: [facility] Summary of Call: Pt needs a refill on zegerid 88m, recieved fax from pharmacy. Initial call taken by: [place]herron Monday [place]  April 18, 2007 12:35 PM  Follow-up for Phone Call        Pt was given a 90 days supply on 04/06/07. I will go ahead and auth. 1 refill. Sent thru EMR Follow-up by: SSherron Monday CMA,  April 18, 2007 12:38 PM      Prescriptions: ZEGERID 40-1100 MG  CAPS (OMEPRAZOLE-SODIUM BICARBONATE) 1 by mouth once daily  #30 x 0   Entered by:   SSherron Monday CMA   Authorized by:   WBurnis MedinMD   Signed by:   SSherron Monday CMA on 04/18/2007   Method used:   [facility]ronically [facility] to ...       [facility]      [address]      [place]  [address]      Ph: [phone]      Fax: [phone]  RxID:   [id number]

## 2010-06-24 NOTE — Progress Notes (Signed)
Summary: needs appt for symbicort refill  Phone Note Outgoing Call   Call placed by: Parke Poisson CNA,  August 18, 2009 12:09 PM Call placed to: Patient Summary of Call: ATC pt to schedule appt w/ MW for her symbicort refills - pt has not been seen since 2009.  line busy x1.  Initial call taken by: Parke Poisson CNA,  August 18, 2009 12:10 PM  Follow-up for Phone Call        ATC pt and number was d/c'ed.  Will send a letter to call for appt.  Follow-up by: Tilden Dome,  August 19, 2009 4:39 PM

## 2010-06-24 NOTE — Letter (Signed)
Summary: [facility]  [address], [place] [address]   Phone: [phone]  Fax: [phone]    08/19/2009  [place], [place]  [address]  Dear [name],   We have received request for your medication from your pharmacy.  We can not refill your medicine until you schedule apppointment with Dr Melvyn Novas, because you have not been seen here in over a year.  Please call our office at [phone] to make an appointment. Thank You.        Sincerely,   [facility]

## 2010-06-24 NOTE — Assessment & Plan Note (Signed)
Summary: follow up/ssc   Vital Signs:  Patient Profile:   70 Years Old Female Weight:      167 pounds Pulse rate:   72 / minute BP sitting:   120 / 80  (left arm) Cuff size:   regular  Vitals Entered By: Sherron Monday, CMA (January 11, 2007 10:52 AM)               Chief Complaint:  Pt wants a cxr- don't have much lung capacity.  History of Present Illness: Has SOB a lot and out of breath at top of stairs. ? if possible from weight gain but not enough. to explain symptoms.   Dr. Patsey Berthold rx with advair and some improvement but is out of med for now.  Had PFTs. in past.  And recent Cxray which was wnl.  I don't have Pulmonary consult infor in chart.  DOE Increasing over 6-9 months. No chest pain. Ankle swelling at end of day. Decreased in am. No osa symptom . BP has been ok. No pnd cough.  See past hx of elevated hg at fall 07 visit.   She is on low dose premarin.  Current Allergies (reviewed today): ! BACTRIM (SULFAMETHOXAZOLE-TRIMETHOPRIM)   Family History:    Reviewed history and no changes required:  Social History:    Reviewed history and no changes required:   Risk Factors:  Tobacco use:  never Alcohol use:  yes    Type:  wine    Drinks per day:  <1 Exercise:  no Seatbelt use:  100 %   Review of Systems       as per hpi   Physical Exam  General:     Well-developed,well-nourished,in no acute distress; alert,appropriate and cooperative throughout examination Neck:     No deformities, masses, or tenderness noted.Possible thyroid palpable. Lungs:     Normal respiratory effort, chest expands symmetrically. Lungs are clear to auscultation, no crackles or wheezes. Heart:     Normal rate and regular rhythm. S1 and S2 normal without gallop, murmur, click, rub or other extra sounds.  EKG wnl Abdomen:     Bowel sounds positive,abdomen soft and non-tender without masses, organomegaly or hernias noted. Extremities:     neg club or cyanosis.  Trc edema  bilaterally Neurologic:     grossly normal  Skin:     turgor normal.   Cervical Nodes:     No lymphadenopathy noted Psych:     normally interactive, good eye contact, and not anxious appearing.      Impression & Recommendations:  Problem # 1:  SOB (ICD-786.05) Assessment: New Possible pulmonary rad cause but with increasing symptom and hx of elevated hg in past needs more evaluation. Schedule echo. and restart advair Orders: Venipuncture (02725) EKG w/ Interpretation (93000) TLB-CBC Platelet - w/Differential (85025-CBCD) TLB-BMP (Basic Metabolic Panel-BMET) (36644-IHKVQQV) TLB-TSH (Thyroid Stimulating Hormone) (84443-TSH) TLB-BNP (B-Natriuretic Peptide) (83880-BNPR)   Problem # 2:  HYPERTENSION (ICD-401.9)  Her updated medication list for this problem includes:    Norvasc 5 Mg Tabs (Amlodipine besylate)    Triamterene-hctz 37.5-25 Mg Caps (Triamterene-hctz)  Orders: EKG w/ Interpretation (93000)   Problem # 3:  HYPERLIPIDEMIA (ICD-272.4)  Orders: EKG w/ Interpretation (93000)   Complete Medication List: 1)  Norvasc 5 Mg Tabs (Amlodipine besylate) 2)  Triamterene-hctz 37.5-25 Mg Caps (Triamterene-hctz) 3)  Zoloft 100 Mg Tabs (Sertraline hcl) 4)  Celebrex 200 Mg Caps (Celecoxib) .... Take 1 capsule by mouth two times a day 5)  Zegerid 40-1100  Mg Caps (Omeprazole-sodium bicarbonate) 6)  Ambien 10 Mg Tabs (Zolpidem tartrate) .... As needed 7)  Advair Diskus 100-50 Mcg/dose Misc (Fluticasone-salmeterol) .... Use as directed 8)  Albuterol 90 Mcg/act Aers (Albuterol) 9)  Premarin 0.3 Mg Tabs (Estrogens conjugated)  Other Orders: Cardiology Referral (Cardiology)   Patient Instructions: 1)  Restart advair. 2)  Will schedule echocardiogram. 3)  Plan ROV after lab and echo results available.    Prescriptions: ADVAIR DISKUS 100-50 MCG/DOSE  MISC (FLUTICASONE-SALMETEROL) use as directed  #1 x 3   Entered by:   Sherron Monday, CMA   Authorized by:   [name] MD   Signed by:   Sherron Monday, CMA on 01/11/2007   Method used:   Electronically sent to ...       [facility]       [place], [place]  [address]       Ph: [phone] or [phone]       Fax: [phone]   RxID:   [id number] ADVAIR DISKUS 100-50 MCG/DOSE  MISC (FLUTICASONE-SALMETEROL)   #1 x 3   Entered and Authorized by:   [name] MD   Signed by:   [name] MD on 01/11/2007   Method used:   Electronically sent to ...       [facility] [place]       [place], [place]  [address]       Ph: [phone] or [phone]       Fax: [phone]   RxID:   [id number]

## 2010-06-24 NOTE — Progress Notes (Signed)
Summary: letter from pt wanting referral-lm 12/15  Phone Note Call from Patient   Caller: Patient Summary of Call: We recieved a letter from pt wanting to be referred to a nuerologist. Per Dr. Regis Bill- We can go ahead and refer her but she may want an office visit. We can try meds in the meantime. Left message for pt to call back but I went ahead and sent an order for the referral. Initial call taken by: Sherron Monday, [place],  May 07, 2007 3:51 PM  Follow-up for Phone Call        Records faxed 12/16/Waiting on call back Follow-up by: Rolinda Roan,  May 08, 2007 8:29 AM

## 2010-06-24 NOTE — Assessment & Plan Note (Signed)
Summary: follow up/ssc   Vital Signs:  Patient profile:   70 year old female Menstrual status:  hysterectomy Height:      65.75 inches Weight:      167 pounds BMI:     27.26 Pulse rate:   66 / minute BP sitting:   120 / 80  (right arm) Cuff size:   regular  Vitals Entered By: Sherron Monday, CMA (AAMA) (February 27, 2009 11:02 AM) CC: Follow-up visit on meds, Hypertension Management LMP - Character: hyst     Menstrual Status hysterectomy   History of Present Illness: [name] comes in for  follow up of multiple medical problems .  Since last visit she has no major change in  health . Sees dr Jannifer Franklin for atypical facial pain. labs  work done  to follow   Ht : stable no se of meds  Mood : stable despite stresses  GERD  .  Had eval  and was felt to have   i ELR  causing lungs to have .sx  She has  s stopped the meds.  and still doing ok  Pulm : had eval and on as needed inhalants with gooresponse .  LIPIds no se of med s OA  celebrex    helps   HRT :  on it for years.   no se   Hypertension History:      She denies headache, chest pain, palpitations, dyspnea with exertion, orthopnea, PND, peripheral edema, visual symptoms, neurologic problems, syncope, and side effects from treatment.  She notes no problems with any antihypertensive medication side effects.        Positive major cardiovascular risk factors include female age 32 years old or older, hyperlipidemia, and hypertension.  Negative major cardiovascular risk factors include no history of diabetes and non-tobacco-user status.        Further assessment for target organ damage reveals no history of ASHD, stroke/TIA, or peripheral vascular disease.     Preventive Screening-Counseling & Management  Alcohol-Tobacco     Alcohol drinks/day: <1     Alcohol type: wine     Smoking Status: never  Caffeine-Diet-Exercise     Caffeine use/day: 1     Does Patient Exercise: no  Current Medications (verified): 1)   Norvasc 5 Mg  Tabs (Amlodipine Besylate) .Marland Kitchen.. 1 By Mouth Once Daily 2)  Zoloft 100 Mg  Tabs (Sertraline Hcl) .Marland Kitchen.. 1 By Mouth Once Daily 3)  Celebrex 200 Mg  Caps (Celecoxib) .... Take 1 Capsule Once Daily 4)  Premarin 0.3 Mg  Tabs (Estrogens Conjugated) .Marland Kitchen.. 1 By Mouth Once Daily 5)  Maxzide-25 37.5-25 Mg  Tabs (Triamterene-Hctz) .Marland Kitchen.. 1 By Mouth Once Daily 6)  Crestor 10 Mg  Tabs (Rosuvastatin Calcium) .Marland Kitchen.. 1 By Mouth Once Daily 7)  Fiorinal/codeine #3 50-325-40-30 Mg  Caps (Butalbital-Asa-Caff-Codeine) .... Take 1-2 By Mouth Every 4-6 Hours As Needed. Max of 6 Tablets in 24 Hours. 8)  Carbamazepine 200 Mg Tabs (Carbamazepine) .... ? Dose For Facial  Pain Per Neuro 9)  Tegretol 200 Mg Tabs (Carbamazepine) .Marland Kitchen.. 1 Three Times A Day 10)  Restasis 0.05 % Emul (Cyclosporine) .Marland Kitchen.. 1 Drop To Each Eye Two Times A Day 11)  Symbicort 80-4.5 Mcg/act  Aero (Budesonide-Formoterol Fumarate) .... Two Puffs Every 12 Hours 12)  Albuterol 90 Mcg/act  Aers (Albuterol) .Marland Kitchen.. 1 Puff Every 4-6 Hours As Needed  Allergies (verified): 1)  ! Bactrim (Sulfamethoxazole-Trimethoprim)  Past History:  Past Medical History: GERD Hyperlipidemia Hypertension g2p2  ppd when young Wears Hearing aids Headache migraine and hypnic HA eval by Dr Earley Favor in the past         LAST Mammogram: 2008 Pap: hyst Td: due Colonscopy: years EKG: 10/21/04  Past History:  Care Management: Neurology: [facility]  Family History: Family History of Alcoholism/Addiction fa Family History Hypertension Family History Ovarian cancer mom Family History of Stroke M 1st degree relative parents 53's  mom had gallbladder out  when middle age  Daughter newly diagnosed with seizures.  under evaluation  Social History: Caffeine use/day:  1  Review of Systems  The patient denies anorexia, fever, weight loss, weight gain, vision loss, decreased hearing, hoarseness, chest pain, syncope, dyspnea on exertion, peripheral edema,  prolonged cough, hemoptysis, abdominal pain, melena, hematochezia, severe indigestion/heartburn, hematuria, incontinence, muscle weakness, transient blindness, difficulty walking, depression, unusual weight change, abnormal bleeding, enlarged lymph nodes, and angioedema.    Physical Exam  General:  Well-developed,well-nourished,in no acute distress; alert,appropriate and cooperative throughout examination Head:  normocephalic and atraumatic.   Eyes:  vision grossly intact, pupils equal, and pupils round.   Ears:  R ear normal and L ear normal.   Nose:  no external deformity and no nasal discharge.   Mouth:  pharynx pink and moist.   Neck:  No deformities, masses, or tenderness noted. Lungs:  Normal respiratory effort, chest expands symmetrically. Lungs are clear to auscultation, no crackles or wheezes.no dullness.   Heart:  Normal rate and regular rhythm. S1 and S2 normal without gallop, murmur, click, rub or other extra sounds.no lifts.   Abdomen:  Bowel sounds positive,abdomen soft and non-tender without masses, organomegaly or   noted. Pulses:  pulses intact without delay   Extremities:  no clubbing cyanosis or edema  Neurologic:  alert & oriented X3, cranial nerves II-XII intact, strength normal in all extremities, and gait normal.   Skin:  turgor normal, color normal, no suspicious lesions, no ecchymoses, and no petechiae.   Cervical Nodes:  No lymphadenopathy noted Psych:  Oriented X3, normally interactive, good eye contact, not anxious appearing, and not depressed appearing.     Impression & Recommendations:  Problem # 1:  HYPERTENSION (ICD-401.9)  Her updated medication list for this problem includes:    Norvasc 5 Mg Tabs (Amlodipine besylate) .Marland Kitchen... 1 by mouth once daily    Maxzide-25 37.5-25 Mg Tabs (Triamterene-hctz) .Marland Kitchen... 1 by mouth once daily  BP today: 120/80 Prior BP: 130/80 (05/07/2008)  10 Yr Risk Heart Disease: 5 % Prior 10 Yr Risk Heart Disease: 3 %  (06/05/2007)  Labs Reviewed: K+: 3.9 (02/25/2008) Creat: : 1.1 (02/25/2008)   Chol: 171 (02/25/2008)   HDL: 64.1 (02/25/2008)   LDL: 92 (02/25/2008)   TG: 77 (02/25/2008)  Problem # 2:  OSTEOARTHRITIS (ICD-715.90)  Her updated medication list for this problem includes:    Celebrex 200 Mg Caps (Celecoxib) .Marland Kitchen... Take 1 capsule once daily    Fiorinal/codeine #3 50-325-40-30 Mg Caps (Butalbital-asa-caff-codeine) .Marland Kitchen... Take 1-2 by mouth every 4-6 hours as needed. max of 6 tablets in 24 hours.  Discussed use of medications, application of heat or cold, and exercises.   Problem # 3:  HYPERLIPIDEMIA (EHU-314.4)  Her updated medication list for this problem includes:    Crestor 10 Mg Tabs (Rosuvastatin calcium) .Marland Kitchen... 1 by mouth once daily  Labs Reviewed: SGOT: 29 (02/25/2008)   SGPT: 29 (02/25/2008)  Lipid Goals: Chol Goal: 200 (06/05/2007)   HDL Goal: 40 (06/05/2007)   LDL Goal: 160 (06/05/2007)   TG  Goal: 150 (06/05/2007)  10 Yr Risk Heart Disease: 5 % Prior 10 Yr Risk Heart Disease: 3 % (06/05/2007)   HDL:64.1 (02/25/2008), 71.7 (05/30/2007)  LDL:92 (02/25/2008), 88 (05/30/2007)  Chol:171 (02/25/2008), 173 (05/30/2007)  Trig:77 (02/25/2008), 68 (05/30/2007)  Problem # 4:  DYSPNEA (ICD-786.09) RAD  and uses as needed inhaler  Her updated medication list for this problem includes:    Maxzide-25 37.5-25 Mg Tabs (Triamterene-hctz) .Marland Kitchen... 1 by mouth once daily    Symbicort 80-4.5 Mcg/act Aero (Budesonide-formoterol fumarate) .Marland Kitchen..Marland Kitchen Two puffs every 12 hours    Albuterol 90 Mcg/act Aers (Albuterol) .Marland Kitchen... 1 puff every 4-6 hours as needed  Problem # 5:  mood  ok on zoloft no sig se of meds   Problem # 6:  FACIAL PAIN (ICD-784.0) per  on tegretol  Her updated medication list for this problem includes:    Celebrex 200 Mg Caps (Celecoxib) .Marland Kitchen... Take 1 capsule once daily    Fiorinal/codeine #3 50-325-40-30 Mg Caps (Butalbital-asa-caff-codeine) .Marland Kitchen... Take 1-2 by mouth every 4-6 hours as needed.  max of 6 tablets in 24 hours.  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 1 by mouth once daily  Complete Medication List: 1)  Norvasc 5 Mg Tabs (Amlodipine besylate) .Marland Kitchen.. 1 by mouth once daily 2)  Zoloft 100 Mg Tabs (Sertraline hcl) .Marland Kitchen.. 1 by mouth once daily 3)  Celebrex 200 Mg Caps (Celecoxib) .... Take 1 capsule once daily 4)  Premarin 0.3 Mg Tabs (Estrogens conjugated) .Marland Kitchen.. 1 by mouth once daily 5)  Maxzide-25 37.5-25 Mg Tabs (Triamterene-hctz) .Marland Kitchen.. 1 by mouth once daily 6)  Crestor 10 Mg Tabs (Rosuvastatin calcium) .Marland Kitchen.. 1 by mouth once daily 7)  Fiorinal/codeine #3 50-325-40-30 Mg Caps (Butalbital-asa-caff-codeine) .... Take 1-2 by mouth every 4-6 hours as needed. max of 6 tablets in 24 hours. 8)  Carbamazepine 200 Mg Tabs (Carbamazepine) .... ? dose for facial  pain per neuro 9)  Tegretol 200 Mg Tabs (Carbamazepine) .Marland Kitchen.. 1 three times a day 10)  Restasis 0.05 % Emul (Cyclosporine) .Marland Kitchen.. 1 drop to each eye two times a day 11)  Symbicort 80-4.5 Mcg/act Aero (Budesonide-formoterol fumarate) .... Two puffs every 12 hours 12)  Albuterol 90 Mcg/act Aers (Albuterol) .Marland Kitchen.. 1 puff every 4-6 hours as needed  Hypertension Assessment/Plan:      The patient's hypertensive risk group is category B: At least one risk factor (excluding diabetes) with no target organ damage.  Her calculated 10 year risk of coronary heart disease is 5 %.  Today's blood pressure is 120/80.    Patient Instructions: 1)  decrease  premarin as needed 2)  Get a mammogram . 3)  Continue  medications otherwise.  4)  full labs in [place] feb  call in meantime if needed Prescriptions: CRESTOR 10 MG  TABS (ROSUVASTATIN CALCIUM) 1 by mouth once daily  #90 x 0   Entered and Authorized by:   [name] MD   Signed by:   [name] MD on 02/27/2009   Method used:   Faxed to ...       [facility] (mail-order)             , [place]         Ph:        Fax: [phone]   RxID:   [id number] MAXZIDE-25 37.5-25 MG  TABS (TRIAMTERENE-HCTZ) 1 by mouth once daily  #  90 Tablet x 3   Entered and Authorized by:   [name] MD   Signed by:   [name] MD on 02/27/2009   Method used:   Faxed to ...       [facility] (mail-order)             , [place]         Ph:        Fax: [phone]   RxID:   [id number] PREMARIN 0.3 MG  TABS (ESTROGENS CONJUGATED) 1 by mouth once daily  #90 x 3   Entered and Authorized by:   [name] MD   Signed by:   [name] MD on 02/27/2009   Method used:   Faxed to ...       [facility] (mail-order)             , [place]         Ph:        Fax: [phone]   RxID:   [id number] CELEBREX 200 MG  CAPS (CELECOXIB) Take 1 capsule once daily  #90 x 3   Entered and Authorized by:   [name] MD   Signed by:   [name] MD on 02/27/2009   Method used:   Faxed to ...       [facility] (mail-order)             , [place]         Ph:        Fax: [phone]   RxID:   [id number] ZOLOFT 100 MG  TABS (SERTRALINE HCL) 1 by mouth once daily  #90 x 3   Entered and Authorized by:   [name] MD   Signed by:   [name] MD on 02/27/2009   Method used:   Faxed to ...       [facility] (mail-order)             , [place]         Ph:        Fax: [phone]   RxID:   [id number] NORVASC 5 MG  TABS (AMLODIPINE BESYLATE) 1 by mouth once daily  #90 Tablet x 3   Entered and Authorized by:   [name] MD   Signed by:   [name] MD on 02/27/2009   Method used:   Faxed to ...       [facility] (mail-order)             , [place]         Ph:        Fax: [phone]   RxID:   [id number]

## 2010-06-24 NOTE — Assessment & Plan Note (Signed)
Summary: follow up/ssc   Vital Signs:  Patient profile:   70 year old female Menstrual status:  hysterectomy Height:      65.75 inches Weight:      169 pounds BMI:     27.58 Pulse rate:   78 / minute BP sitting:   100 / 80  (right arm) Cuff size:   regular  Vitals Entered By: Sherron Monday, CMA (AAMA) (March 03, 2010 11:32 AM)  Nutrition Counseling: Patient's BMI is greater than 25 and therefore counseled on weight management options. CC: Follow-up visit on meds, Hypertension Management   History of Present Illness: [name]  follow up of above.  multiple problems . Her last  visit was  May for a cyst problem  and labs were done then. .    HT : stable  Still has SOB:    symbicort  helped  .  stopped taking when    signs better .     using  albuterol intermittently .  trying to lose   weight /  HRT    has  decrease prermarin to 3 x per week.  On zoloft initially.   had  depression  and was rx by her family member at onset.   doing better . No t treated here   Hypertension History:      She complains of headache, dyspnea with exertion, and visual symptoms, but denies chest pain, palpitations, orthopnea, PND, peripheral edema, neurologic problems, syncope, and side effects from treatment.  She notes no problems with any antihypertensive medication side effects.  ha's occ. , Left eye hemmorrahage that comes every few weeks.        Positive major cardiovascular risk factors include female age 22 years old or older, hyperlipidemia, and hypertension.  Negative major cardiovascular risk factors include no history of diabetes and non-tobacco-user status.        Further assessment for target organ damage reveals no history of ASHD, stroke/TIA, or peripheral vascular disease.     Preventive Screening-Counseling & Management  Alcohol-Tobacco     Alcohol drinks/day: <1     Alcohol type: wine     Smoking Status: never  Caffeine-Diet-Exercise     Caffeine use/day: 1  Does Patient Exercise: no  Current Medications (verified): 1)  Norvasc 5 Mg  Tabs (Amlodipine Besylate) .Marland Kitchen.. 1 By Mouth Once Daily 2)  Zoloft 100 Mg  Tabs (Sertraline Hcl) .Marland Kitchen.. 1 By Mouth Once Daily 3)  Celebrex 200 Mg  Caps (Celecoxib) .... Take 1 Capsule Once Daily 4)  Premarin 0.3 Mg  Tabs (Estrogens Conjugated) .Marland Kitchen.. 1 By Mouth 3 Times A Week. 5)  Maxzide-25 37.5-25 Mg  Tabs (Triamterene-Hctz) .Marland Kitchen.. 1 By Mouth Once Daily 6)  Crestor 10 Mg  Tabs (Rosuvastatin Calcium) .Marland Kitchen.. 1 By Mouth Once Daily 7)  Fiorinal/codeine #3 50-325-40-30 Mg  Caps (Butalbital-Asa-Caff-Codeine) .... Take 1-2 By Mouth Every 4-6 Hours As Needed. Max of 6 Tablets in 24 Hours. 8)  Tegretol 200 Mg Tabs (Carbamazepine) .Marland Kitchen.. 1 Twice To Three Times A Day 9)  Symbicort 80-4.5 Mcg/act  Aero (Budesonide-Formoterol Fumarate) .... Two Puffs Every 12 Hours 10)  Albuterol 90 Mcg/act  Aers (Albuterol) .Marland Kitchen.. 1 Puff Every 4-6 Hours As Needed  Allergies (verified): 1)  ! Bactrim (Sulfamethoxazole-Trimethoprim)  Past History:  Past medical, surgical, family and social histories (including risk factors) reviewed, and no changes noted (except as noted below).  Past Medical History: GERD Hyperlipidemia Hypertension g2p2 ppd when young Echo nl lv function midl  lv dilitation 2009 Wears Hearing aids Headache migraine and hypnic HA eval by Dr Earley Favor in the past  elevatedH and H in the past        LAST Mammogram: 2008 Pap: hyst Td: due Colonscopy: years EKG: 10/21/04  Past Surgical History: Reviewed history from 06/05/2007 and no changes required. Hysterectomy, tubal 2002 chest pains neg cath  2000 r shoulder surgery   Past History:  Care Management: Neurology: [facility] 2009   Family History: Reviewed history from 02/27/2009 and no changes required. Family History of Alcoholism/Addiction fa Family History Hypertension Family History Ovarian cancer mom Family History of Stroke M 1st  degree relative parents 68's  mom had gallbladder out  when middle age  Daughter newly diagnosed with seizures.  under evaluation  Social History: Reviewed history from 05/07/2008 and no changes required. Never Smoked Married hh of 2  -3  ( god daughter)  pets  2 dogs   ETOH- twice a wk  Review of Systems       The patient complains of dyspnea on exertion.  The patient denies anorexia, fever, weight loss, weight gain, decreased hearing, chest pain, syncope, peripheral edema, prolonged cough, abdominal pain, abnormal bleeding, enlarged lymph nodes, angioedema, and breast masses.         restatis       for dry eyes   for  eys  burst blood vessel  red      Physical Exam  General:  Well-developed,well-nourished,in no acute distress; alert,appropriate and cooperative throughout examination Head:  normocephalic and atraumatic.   Eyes:  vision grossly intact.  fading  sub conj hemm left eoms nl  Ears:  R ear normal and L ear normal.   Nose:  no external deformity and no nasal discharge.   Mouth:  pharynx pink and moist.   Neck:  No deformities, masses, or tenderness noted. Lungs:  Normal respiratory effort, chest expands symmetrically. Lungs are clear to auscultation, no crackles or wheezes. Heart:  Normal rate and regular rhythm. S1 and S2 normal without gallop, murmur, click, rub or other extra sounds.no lifts.   Abdomen:  Bowel sounds positive,abdomen soft and non-tender without masses, organomegaly or   noted. Extremities:  no clubbing cyanosis or edema  Neurologic:  alert & oriented X3 and gait normal.  grossly inteact Skin:  turgor normal, color normal, no ecchymoses, and no petechiae.   Cervical Nodes:  No lymphadenopathy noted Psych:  Oriented X3, normally interactive, good eye contact, not anxious appearing, and not depressed appearing.     Impression & Recommendations:  Problem # 1:  SOB (ICD-786.05) ongoing    unclear dx .  intereetingly has elevated H and H slightly .   echo in 2009  mildy dilated lv nl lv function. 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 1 by mouth once daily    Maxzide-25 37.5-25 Mg Tabs (Triamterene-hctz) .Marland Kitchen... 1 by mouth once daily  BP today: 100/80 Prior BP: 120/80 (09/21/2009)  Prior 10 Yr Risk Heart Disease: 5 % (02/27/2009)  Labs Reviewed: K+: 4.0 (11/05/2009) Creat: : 0.9 (11/05/2009)   Chol: 203 (11/05/2009)   HDL: 82.20 (11/05/2009)   LDL: 92 (02/25/2008)   TG: 125.0 (11/05/2009)  Problem # 3:  HYPERLIPIDEMIA (ICD-272.4)  Her updated medication list for this problem includes:    Crestor 10 Mg Tabs (Rosuvastatin calcium) .Marland KitchenMarland KitchenMarland KitchenMarland Kitchen  1 by mouth once daily  Labs Reviewed: SGOT: 37 (11/05/2009)   SGPT: 29 (11/05/2009)  Lipid Goals: Chol Goal: 200 (06/05/2007)   HDL Goal: 40 (06/05/2007)   LDL Goal: 160 (06/05/2007)   TG Goal: 150 (06/05/2007)  Prior 10 Yr Risk Heart Disease: 5 % (02/27/2009)   HDL:82.20 (11/05/2009), 64.1 (02/25/2008)  LDL:92 (02/25/2008), 88 (05/30/2007)  Chol:203 (11/05/2009), 171 (02/25/2008)  Trig:125.0 (11/05/2009), 77 (02/25/2008)  Problem # 4:  FACIAL PAIN (ICD-784.0) stable Her updated medication list for this problem includes:    Celebrex 200 Mg Caps (Celecoxib) .Marland Kitchen... Take 1 capsule once daily    Fiorinal/codeine #3 50-325-40-30 Mg Caps (Butalbital-asa-caff-codeine) .Marland Kitchen... Take 1-2 by mouth every 4-6 hours as needed. max of 6 tablets in 24 hours.  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 1 by mouth once daily  Problem # 6:  ADJ DISORDER WITH MIXED ANXIETY & DEPRESSED MOOD (ICD-309.28)  initially on zoloft for this from  another physcian ? family member   no records  concerning this   but  we have  continued med and she is doing very well . to remain   on such for now. consider decrease  as appropriate.  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 1 by mouth once daily 2)  Zoloft 100 Mg Tabs (Sertraline hcl) .Marland Kitchen.. 1 by mouth once daily 3)  Celebrex 200 Mg Caps (Celecoxib) .... Take 1 capsule once daily 4)  Premarin 0.3 Mg Tabs (Estrogens conjugated) .Marland Kitchen.. 1 by mouth 3 times a week. 5)  Maxzide-25 37.5-25 Mg Tabs (Triamterene-hctz) .Marland Kitchen.. 1 by mouth once daily 6)  Crestor 10 Mg Tabs (Rosuvastatin calcium) .Marland Kitchen.. 1 by mouth once daily 7)  Fiorinal/codeine #3 50-325-40-30 Mg Caps (Butalbital-asa-caff-codeine) .... Take 1-2 by mouth every 4-6 hours as needed. max of 6 tablets in 24 hours. 8)  Tegretol 200 Mg Tabs (Carbamazepine) .Marland Kitchen.. 1 twice to three times a day 9)  Symbicort 80-4.5 Mcg/act Aero (Budesonide-formoterol fumarate) .... Two puffs every 12 hours 10)  Albuterol 90 Mcg/act Aers (Albuterol) .Marland Kitchen.. 1 puff every 4-6 hours as needed  Other Orders: Admin 1st Vaccine (76734) Flu Vaccine 46yr + (([id number]  Hypertension Assessment/Plan:      The patient's hypertensive risk group is category B: At least one risk factor (excluding diabetes) with no target organ damage.  Her calculated 10 year risk of coronary heart disease is 5 %.  Today's blood pressure is 100/80.  Her blood pressure goal is < 140/90.  Patient Instructions: 1)  agree seeing Dr WMelvyn Novasin follow up  for the breathing issue  .  2)  ok to decrease crestor to 1/2 dose in the meantime  and  check labs  April 2012 . 3)  LIPIDS LFTS.  CBCdiff   BMP  TSH.    4)  272.4 ,   401.9 ,   monitoring of   medications 5)  And then  OV.   6)  rec get    opthalmology.  opinion.       Flu Vaccine Consent Questions     Do you have a history of severe allergic reactions to this vaccine? no    Any prior history of allergic reactions to egg and/or  gelatin? no  Do you have a sensitivity to the preservative Thimersol? no    Do you have a past history of Guillan-Barre Syndrome? no    Do you currently have an acute febrile illness? no    Have you ever had a severe reaction to latex? no    Vaccine information given and explained to patient? yes    Are you currently pregnant? no    Lot Number:[id number]   Exp Date:11/20/2010   Site Given  Left Deltoid [place], CMA ([place])  March 03, 2010 11:36 AM

## 2010-06-24 NOTE — Progress Notes (Signed)
Summary: refill on omeprazole 40/1100  Phone Note From Pharmacy   Caller: [facility] Reason for Call: Needs renewal Details for Reason: omeprazole 40/1100 Initial call taken by: Sherron Monday, [place] Deborra Medina),  August 10, 2009 12:59 PM  Follow-up for Phone Call        We have that pt is no longer taking the medication. I called pt and she states that she has started this back up again. I told pt that we would go ahead and call this in. Rx sent electronically. Follow-up by: Sherron Monday, CMA (AAMA),  August 10, 2009 2:42 PM    New/Updated Medications: OMEPRAZOLE-SODIUM BICARBONATE 40-1100 MG CAPS (OMEPRAZOLE-SODIUM BICARBONATE) 1 by mouth once daily Prescriptions: OMEPRAZOLE-SODIUM BICARBONATE 40-1100 MG CAPS (OMEPRAZOLE-SODIUM BICARBONATE) 1 by mouth once daily  #90 x 2   Entered by:   Sherron Monday, CMA (AAMA)   Authorized by:   [name] MD   Signed by:   Sherron Monday, CMA (AAMA) on 08/10/2009   Method used:   Electronically to        [facility] (mail-order)             ,          Ph: [phone]       Fax: [phone]   RxID:   [id number]

## 2010-06-24 NOTE — Assessment & Plan Note (Signed)
Summary: Pulmonary consultation/[name]   Visit Type:  Initial Consult Referred by:  Dr. Regis Bill PCP:  Dr. Regis Bill  Chief Complaint:  SOB.  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  cough resolved on advair, dyspnea did not.   May 07, 2008 seen as new consult for [name] x steps has been doing since 1984 but  stops at top of same steps for a minute for the last 3 years.   Walking is ok but doesn't do a lot of it, ok walking at beach but difficulty with hills when goes to [place] to visit her daughter. Cards wu neg inschemia. No assoc cp or palp, hoarseness or wheezing.   Occ wheezes at hs resolves after one puff of albuterol maybe twice a week. Pt denies any significant sore throat, nasal congestion or excess secretions, fever, chills, sweats, unintended wt loss, pleuritic or exertional cp, orthopnea pnd or leg swelling.  Pt also denies any obvious fluctuation in symptoms with weather or environmental change or other alleviating or aggravating factors.            Updated Prior Medication List: NORVASC 5 MG  TABS (AMLODIPINE BESYLATE) 1 by mouth once daily ZOLOFT 100 MG  TABS (SERTRALINE HCL) 1 by mouth once daily CELEBREX 200 MG  CAPS (CELECOXIB) Take 1 capsule once daily ZEGERID 40-1100 MG  CAPS (OMEPRAZOLE-SODIUM BICARBONATE) 1 by mouth once daily ADVAIR DISKUS 100-50 MCG/DOSE  MISC (FLUTICASONE-SALMETEROL) 1 puff two times a day ALBUTEROL 90 MCG/ACT  AERS (ALBUTEROL) 1 puff every 4-6 hours prn PREMARIN 0.3 MG  TABS (ESTROGENS CONJUGATED) 1 by mouth once daily MAXZIDE-25 37.5-25 MG  TABS (TRIAMTERENE-HCTZ) 1 by mouth once daily CRESTOR 10 MG  TABS (ROSUVASTATIN CALCIUM) 1 by mouth once daily FIORINAL/CODEINE #3 50-325-40-30 MG  CAPS (BUTALBITAL-ASA-CAFF-CODEINE) Take 1-2 by mouth every 4-6 hours as needed. Max of 6 tablets in 24 hours. CARBAMAZEPINE 200 MG TABS  (CARBAMAZEPINE) ? dose for facial  pain per Neuro TEGRETOL 200 MG TABS (CARBAMAZEPINE) 1 three times a day RESTASIS 0.05 % EMUL (CYCLOSPORINE) 1 drop to each eye two times a day  Current Allergies (reviewed today): ! 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       Wears Hearing aids       Headache migraine and hypnic HA eval by Dr Earley Favor in the past              LAST       Mammogram: 2008       Pap: hyst       Td: due       Colonscopy: years       EKG: 10/21/04         Past Surgical History:    Reviewed history from 06/05/2007 and no changes required:       Hysterectomy, tubal 2002       chest pains neg cath  2000       r shoulder surgery    Family History:    Reviewed history from 02/18/2008 and no changes required:       Family History of Alcoholism/Addiction fa  Family History Hypertension       Family History Ovarian cancer mom       Family History of Stroke M 1st degree relative parents 62's              mom had gallbladder out  when middle age   Social History:    Never Smoked    Married    hh of 2  -3  ( god daughter)     pets  2 dogs      ETOH- twice a wk    Review of Systems  The patient denies anorexia, fever, weight loss, weight gain, vision loss, decreased hearing, hoarseness, chest pain, syncope, peripheral edema, prolonged cough, headaches, hemoptysis, abdominal pain, melena, hematochezia, severe indigestion/heartburn, hematuria, incontinence, muscle weakness, suspicious skin lesions, transient blindness, difficulty walking, depression, unusual weight change, abnormal bleeding, enlarged lymph nodes, and angioedema.     Vital Signs:  Patient Profile:   70 Years Old Female Height:     65.75 inches Weight:      167.19 pounds O2 Sat:      95 % O2 treatment:    Room Air Temp:      97.6 degrees F oral Pulse rate:   76 / minute BP sitting:   130 / 80  (left arm)  Vitals Entered By: Tilden Dome (May 07, 2008 9:50 AM)                 Physical Exam  wt 175 > 167 May 07, 2008  Ambulatory healthy appearing wf  in no acute distress. Afeb with normal vital signs classic  pseudowheeze resolves with purse lip maneuver  HEENT: nl dentition, turbinates, and orophanx. Nl external ear canals without cough reflex Neck without JVD/Nodes/TM Lungs clear to A and P bilaterally without cough on insp or exp maneuvers RRR no s3 or murmur or increase in P2 Abd soft and benign with nl excursion in the supine position. No bruits or organomegaly Ext warm without calf tenderness, cyanosis clubbing or edema Skin warm and dry without lesions        Impression & Recommendations:  Problem # 1:  DYSPNEA (ICD-786.09) Previous pft's reviewed from 11/29/04 show exp flattening more constent with vcd than asthma but need to be repeated but in meantime given the pseudowheeze resolves with purse lip maneuver  needs to work on purse lip breathing when sob and change advair to symbicort  80 2 two times a day.  (she has some of the findings of upper airway instability masquerading as asthma  and better characterized as pseudoasthma  typically due  to one of the 4  A's : ACE inhibitors, Acid reflux, Advair, or Anxiety - 2/4 apply to her).  I spent extra time with the patient today explaining optimal mdi  technique.  This improved from  71-75% The following medications were removed from the medication list:    Advair Diskus 100-50 Mcg/dose Misc (Fluticasone-salmeterol) .Marland Kitchen... 1 puff two times a day  Her updated medication list for this problem includes:    Maxzide-25 37.5-25 Mg Tabs (Triamterene-hctz) .Marland Kitchen... 1 by mouth once daily    Symbicort 80-4.5 Mcg/act Aero (Budesonide-formoterol fumarate) .Marland Kitchen..Marland Kitchen Two puffs every 12 hours    Albuterol 90 Mcg/act Aers (Albuterol) .Marland Kitchen... 1 puff every 4-6  hours as needed  Orders: Consultation Level V (20947)   Problem # 2:  GERD (ICD-530.81) Wheezing at hs assoc with pseudowheeze on exam suggests nocturnal gerd, which may be contribiuting to airways instablity as  well.  Diet reviewed.  Her updated medication list for this problem includes:    Zegerid 40-1100 Mg Caps (Omeprazole-sodium bicarbonate) .Marland Kitchen... 1 by mouth once daily at bedtime  Her updated medication list for this problem includes:    Zegerid 40-1100 Mg Caps (Omeprazole-sodium bicarbonate) .Marland Kitchen... 1 by mouth once daily  Orders: Consultation Level V (60737)   Medications Added to Medication List This Visit: 1)  Restasis 0.05 % Emul (Cyclosporine) .Marland Kitchen.. 1 drop to each eye two times a day 2)  Symbicort 80-4.5 Mcg/act Aero (Budesonide-formoterol fumarate) .... Two puffs every 12 hours 3)  Albuterol 90 Mcg/act Aers (Albuterol) .Marland Kitchen.. 1 puff every 4-6 hours as needed  Complete Medication List: 1)  Norvasc 5 Mg Tabs (Amlodipine besylate) .Marland Kitchen.. 1 by mouth once daily 2)  Zoloft 100 Mg Tabs (Sertraline hcl) .Marland Kitchen.. 1 by mouth once daily 3)  Celebrex 200 Mg Caps (Celecoxib) .... Take 1 capsule once daily 4)  Zegerid 40-1100 Mg Caps (Omeprazole-sodium bicarbonate) .Marland Kitchen.. 1 by mouth once daily 5)  Premarin 0.3 Mg Tabs (Estrogens conjugated) .Marland Kitchen.. 1 by mouth once daily 6)  Maxzide-25 37.5-25 Mg Tabs (Triamterene-hctz) .Marland Kitchen.. 1 by mouth once daily 7)  Crestor 10 Mg Tabs (Rosuvastatin calcium) .Marland Kitchen.. 1 by mouth once daily 8)  Fiorinal/codeine #3 50-325-40-30 Mg Caps (Butalbital-asa-caff-codeine) .... Take 1-2 by mouth every 4-6 hours as needed. max of 6 tablets in 24 hours. 9)  Carbamazepine 200 Mg Tabs (Carbamazepine) .... ? dose for facial  pain per neuro 10)  Tegretol 200 Mg Tabs (Carbamazepine) .Marland Kitchen.. 1 three times a day 11)  Restasis 0.05 % Emul (Cyclosporine) .Marland Kitchen.. 1 drop to each eye two times a day 12)  Symbicort 80-4.5 Mcg/act Aero (Budesonide-formoterol fumarate) .... Two puffs every 12 hours 13)   Albuterol 90 Mcg/act Aers (Albuterol) .Marland Kitchen.. 1 puff every 4-6 hours as needed   Patient Instructions: 1)  GERD (gastroesophageal reflux disease) was discussed. It is a common cause of respiratory symptoms. It commonly presents in the absence of heartburn. GERD can be treated with medication, but also with lifestyle changes including avoidance of late meals, excessive alcohol, smoking cessation, and avoid fatty foods, chocolate, peppermint, colas, red wine, and acidic juices such as orange juice. NO MINT OR MENTHOL PRODUCTS (no cough drops) 2)  USE SUGARLESS CANDY INSTEAD (jolley ranchers)  3)  stop advair 4)  Start symbicort 80 2 every 12 hours and work on smooth and deep inspiration 5)  Please schedule a follow-up appointment in 1 month for PFT's   Prescriptions: SYMBICORT 80-4.5 MCG/ACT  AERO (BUDESONIDE-FORMOTEROL FUMARATE) Two puffs every 12 hours  #3 x 4   Entered and Authorized by:   Tanda Rockers MD   Signed by:   Tanda Rockers MD on 05/07/2008   Method used:   Print then Give to Patient   RxID:   [id number] [place] 80-4.5 MCG/ACT  AERO (BUDESONIDE-FORMOTEROL FUMARATE) Two puffs every 12 hours  #3 x 3   Entered and Authorized by:   Tanda Rockers MD   Signed by:   Tanda Rockers MD on 05/07/2008   Method used:   Electronically to        [id number]* (retail)       [address] [place], [place]  [address]       Ph: [phone]       Fax: [phone]   RxID:   [id number]  ]

## 2010-06-24 NOTE — Progress Notes (Signed)
Summary: requesting antibiotic  Phone Note Call from Patient   Caller: Patient Call For: [name] MD Summary of Call: Pt is complaining of productive (green) cough.  Post nasal drainage and cough x 7 days.  No fever.  Jose Persia [facility]) [phone]  Pt is requesting an antibiotic. Initial call taken by: Deanna Artis CMA,  November 19, 2009 9:22 AM  Follow-up for Phone Call        if worried about bacterial infection she should have OV  other provider today or  I can see her tomorrow. Follow-up by: [name] MD,  November 19, 2009 12:11 PM  Additional Follow-up for Phone Call Additional follow up Details #1::        Pt was informed to come into the office to be seen and she said that wasn't very helpful because she has to work today & leaving to go out of town [place] so she will just try to get through it and hung up the phone. Additional Follow-up by: Clearnce Sorrel CMA,  November 19, 2009 12:57 PM

## 2010-06-24 NOTE — Progress Notes (Signed)
Summary: REFILL  Phone Note Refill Request Call back at Home Phone [phone] Message from:  Patient---LIVE CALL  Refills Requested: Medication #1:  ZOLOFT 100 MG  TABS 1 by mouth once daily SEND TO [facility]  Initial call taken by: Despina Arias,  December 07, 2009 12:32 PM  Follow-up for Phone Call        Rx sent to pharmacy. Follow-up by: Sherron Monday, CMA (AAMA),  December 07, 2009 4:29 PM    Prescriptions: ZOLOFT 100 MG  TABS (SERTRALINE HCL) 1 by mouth once daily  #90 x 1   Entered by:   Sherron Monday, CMA (AAMA)   Authorized by:   [name] MD   Signed by:   Sherron Monday, CMA (AAMA) on 12/07/2009   Method used:   Electronically to        [facility]* (retail)             ,          Ph: [phone]       Fax: [phone]   RxID:   [id number]

## 2010-06-24 NOTE — Assessment & Plan Note (Signed)
Summary: cpx/ssc   Vital Signs:  Patient Profile:   70 Years Old Female Height:     65.75 inches Weight:      175 pounds Pulse rate:   72 / minute BP sitting:   120 / 80  (right arm) Cuff size:   regular  Vitals Entered By: Sherron Monday, [place] (March 19, 2008 10:26 AM)                 Chief Complaint:  Annual visit for disease management.  History of Present Illness: [name] is here for a 1 year rov.   Current Problems:  ABDOMINAL PAIN, RIGHT UPPER QUADRANT (ICD-789.01) HEADACHE (ICD-784.0)  ocass use of meds  JAW PAIN (ICD-526.9)  acupuncture and   medication   still  present   [place]  and  [place].  Had se of Lyrica .  OSTEOARTHRITIS (ICD-715.90) takes celebrex most days to help signs  PREVENTIVE HEALTH CARE (ICD-V70.0)  needs colonoscopy  FAMILY HISTORY OF ALCOHOLISM/ADDICTION (ICD-V61.41) ABNORMAL RESULT, FUNCTION STUDY, [place]  abnormal [place] of abdomen  see mri SOB (ICD-786.05)  continuing and using advair   for weeks and then stops   HYPERTENSION (ICD-401.9) HYPERLIPIDEMIA (ICD-272.4) GERD (ICD-530.81)  ok    on zegerid.  Gallstones  no recurrance.     Joint pains     celebrex    Hrt: : Premarin .3   once daily        Prior Medications Reviewed Using: Patient Recall  Updated Prior Medication List: NORVASC 5 MG  TABS (AMLODIPINE BESYLATE) 1 by mouth once daily ZOLOFT 100 MG  TABS (SERTRALINE HCL) 1 by mouth once daily CELEBREX 200 MG  CAPS (CELECOXIB) Take 1 capsule once daily ZEGERID 40-1100 MG  CAPS (OMEPRAZOLE-SODIUM BICARBONATE) 1 by mouth once daily ADVAIR DISKUS 100-50 MCG/DOSE  MISC (FLUTICASONE-SALMETEROL) 1 puff two times a day ALBUTEROL 90 MCG/ACT  AERS (ALBUTEROL) 1 puff every 4-6 hours prn PREMARIN 0.3 MG  TABS (ESTROGENS CONJUGATED) 1 by mouth once daily MAXZIDE-25 37.5-25 MG  TABS (TRIAMTERENE-HCTZ) 1 by mouth once daily CRESTOR 10 MG  TABS (ROSUVASTATIN CALCIUM) 1 by mouth once daily FIORINAL/CODEINE #3 50-325-40-30 MG  CAPS  (BUTALBITAL-ASA-CAFF-CODEINE) Take 1-2 by mouth every 4-6 hours as needed. Max of 6 tablets in 24 hours. CARBAMAZEPINE 200 MG TABS (CARBAMAZEPINE) ? dose for facial  pain per Neuro TEGRETOL 200 MG TABS (CARBAMAZEPINE) 1 three times a day RESTASIS 0.05 % EMUL (CYCLOSPORINE)   Current Allergies (reviewed today): ! BACTRIM (SULFAMETHOXAZOLE-TRIMETHOPRIM)  Past Medical History:    GERD    Hyperlipidemia    Hypertension    g2p2    ppd when young    Wears Hearing aids    Headache migraine and hypnic HA eval by Dr Earley Favor in the past           LAST    Mammogram: 2008    Pap: hyst    Td: due    Colonscopy: years    EKG: 10/21/04       Social History:    Never Smoked    Married    hh of 2  -3  ( god daughter)     pets  2 dogs           Review of Systems       The patient complains of dyspnea on exertion.  The patient denies anorexia, fever, weight loss, chest pain, peripheral edema, prolonged cough, abdominal pain, severe indigestion/heartburn, hematuria, depression, unusual weight change, abnormal bleeding, enlarged  lymph nodes, angioedema, and breast masses.         rst of ros [place] or negative   Physical Exam  General:     alert, well-developed, and well-nourished.   Head:     normocephalic and atraumatic.   Eyes:     vision grossly intact, pupils equal, pupils round, and pupils reactive to light.   Ears:     R ear normal, L ear normal, and no external deformities.   Nose:     no external deformity, no external erythema, and no nasal discharge.   Mouth:     pharynx pink and moist, no erythema, and no exudates.   Neck:     No deformities, masses, or tenderness noted.no thyromegaly.   Chest Wall:     No deformities, masses, or tenderness noted. Breasts:     No mass, nodules, thickening, tenderness, bulging, retraction, inflamation, nipple discharge or skin changes noted.   Lungs:     Normal respiratory effort, chest expands symmetrically. Lungs are clear to  auscultation, no crackles or wheezes.no dullness.   Heart:     Normal rate and regular rhythm. S1 and S2 normal without gallop, murmur, click, rub or other extra sounds.no lifts.   Abdomen:     Bowel sounds positive,abdomen soft and non-tender without masses, organomegaly  noted. Msk:     no joint swelling, no joint warmth, and no redness over joints.   mild oa changes Pulses:     R and L carotid,radial,femoral,dorsalis pedis and posterior tibial pulses are full and equal bilaterally Extremities:     no cce   vv  Neurologic:     alert & oriented X3, cranial nerves II-XII intact, strength normal in all extremities, gait normal, and DTRs symmetrical and normal.   Skin:     turgor normal, color normal, no ecchymoses, and no petechiae.   Cervical Nodes:     no anterior cervical adenopathy and no posterior cervical adenopathy.   Axillary Nodes:     No palpable lymphadenopathy Inguinal Nodes:     No significant adenopathy Psych:     normally interactive, good eye contact, and not anxious appearing.   Additional Exam:     obtained and reviewed previous notes chart and eval .     Impression & Recommendations:  Problem # 1:  SOB (ICD-786.05) had CV eval in past and only   outcome was  ? copd or asthma  . But signs seem to be mild and  resonsive to meds  . Saw Dr Patsey Berthold in the past   and never follow up after she left for various reasons.  Is stable but with the  continued  elevation of Hg and HCT  would rec pulmonary  consult and opinions.    Orders: Pulmonary Referral (Pulmonary)   Problem # 2:  HYPERTENSION (ICD-401.9) Assessment: Unchanged  Her updated medication list for this problem includes:    Norvasc 5 Mg Tabs (Amlodipine besylate) .Marland Kitchen... 1 by mouth once daily    Maxzide-25 37.5-25 Mg Tabs (Triamterene-hctz) .Marland Kitchen... 1 by mouth once daily  BP today: 120/80 Prior BP: 110/64 (02/18/2008)  Prior 10 Yr Risk Heart Disease: 3 % (06/05/2007)  Labs Reviewed: Creat: 1.1  (02/25/2008) Chol: 171 (02/25/2008)   HDL: 64.1 (02/25/2008)   LDL: 92 (02/25/2008)   TG: 77 (02/25/2008)   Problem # 3:  ? of POLYCYTHEMIA (ICD-289.0) ongoing elevation of h/h in a nonsmoker . 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 1 by mouth once daily  Orders: Gastroenterology Referral (GI)   Problem # 7:  OSTEOARTHRITIS (ICD-715.90) Assessment: Unchanged  Her updated medication list for this problem includes:    Celebrex 200 Mg Caps (Celecoxib) .Marland Kitchen... Take 1 capsule once daily    Fiorinal/codeine #3 50-325-40-30 Mg Caps (Butalbital-asa-caff-codeine) .Marland Kitchen... Take 1-2 by mouth every 4-6 hours as needed. max of 6 tablets in 24 hours.   Problem # 8:  HRT (ICD-V07.4) rec try weaning   off because of risk   Problem # 9:  GALLSTONES (ICD-574.20) probable cause of ruq pain and will follow   Problem # 10:  [place] (ICD-V70.0) mammogram    to get regularly  Orders: Gastroenterology Referral (GI)   Complete Medication List: 1)  Norvasc 5 Mg Tabs (Amlodipine besylate) .Marland Kitchen.. 1 by mouth once daily 2)  Zoloft 100 Mg Tabs (Sertraline hcl) .Marland Kitchen.. 1 by mouth once daily 3)  Celebrex 200 Mg Caps (Celecoxib) .... Take 1 capsule once daily 4)  Zegerid 40-1100 Mg Caps (Omeprazole-sodium bicarbonate) .Marland Kitchen.. 1 by mouth once daily 5)  Advair Diskus 100-50 Mcg/dose Misc (Fluticasone-salmeterol) .Marland Kitchen.. 1 puff two times a day 6)  Albuterol 90 Mcg/act Aers (Albuterol) .Marland Kitchen.. 1 puff every 4-6 hours prn 7)  Premarin 0.3 Mg Tabs (Estrogens conjugated) .Marland Kitchen.. 1 by mouth once daily 8)  Maxzide-25 37.5-25 Mg Tabs (Triamterene-hctz) .Marland Kitchen.. 1 by mouth once daily 9)  Crestor 10 Mg Tabs (Rosuvastatin calcium)  .Marland Kitchen.. 1 by mouth once daily 10)  Fiorinal/codeine #3 50-325-40-30 Mg Caps (Butalbital-asa-caff-codeine) .... Take 1-2 by mouth every 4-6 hours as needed. max of 6 tablets in 24 hours. 11)  Carbamazepine 200 Mg Tabs (Carbamazepine) .... ? dose for facial  pain per neuro 12)  Tegretol 200 Mg Tabs (Carbamazepine) .Marland Kitchen.. 1 three times a day 13)  Restasis 0.05 % Emul (Cyclosporine)   Patient Instructions: 1)  decrease premarin to 3 week.  2)  Will call about colonoscopy and Pulmonary referral. 3)  otherwise rov in about 6 months or prn refilled meds today   90 days  for chronic meds.  Prescriptions: ALBUTEROL 90 MCG/ACT  AERS (ALBUTEROL) 1 puff every 4-6 hours prn  #1 x 3   Entered by:   Sherron Monday, CMA   Authorized by:   [name] MD   Signed by:   Sherron Monday, CMA on 03/19/2008   Method used:   Faxed to ...       [facility] (mail-order)             , [place]         Ph:        Fax: [phone]   RxID:   [id number] CRESTOR 10 MG  TABS (ROSUVASTATIN CALCIUM) 1 by mouth once daily  #90 x 3   Entered by:   Sherron Monday, CMA   Authorized by:   [name] MD   Signed by:   Sherron Monday, CMA on 03/19/2008   Method used:   Faxed to ...       [facility])             , [place]  Ph:        Fax: [phone]   RxID:   [id number] MAXZIDE-25 37.5-25 MG  TABS (TRIAMTERENE-HCTZ) 1 by mouth once daily  #90 Tablet x 3   Entered by:   Sherron Monday, CMA   Authorized by:   [name] MD   Signed by:   Sherron Monday, CMA on 03/19/2008   Method used:   Faxed to ...       [facility] (mail-order)             , [place]         Ph:        Fax: [phone]   RxID:   [id number] PREMARIN 0.3 MG  TABS (ESTROGENS CONJUGATED) 1 by mouth once daily  #90 x 3   Entered by:   Sherron Monday, CMA   Authorized by:   [name] MD   Signed by:   Sherron Monday, CMA on 03/19/2008   Method used:   Faxed to ...       [facility]  (mail-order)             , [place]         Ph:        Fax: [phone]   RxID:   [id number] ADVAIR DISKUS 100-50 MCG/DOSE  MISC (FLUTICASONE-SALMETEROL) 1 puff two times a day  #3 x 3   Entered by:   Sherron Monday, CMA   Authorized by:   [name] MD   Signed by:   Sherron Monday, CMA on 03/19/2008   Method used:   Faxed to ...       [facility] (mail-order)             , [place]         Ph:        Fax: [phone]   RxID:   [id number] ZEGERID 40-1100 MG  CAPS (OMEPRAZOLE-SODIUM BICARBONATE) 1 by mouth once daily  #90 x 3   Entered by:   Sherron Monday, CMA   Authorized by:   [name] MD   Signed by:   Sherron Monday, CMA on 03/19/2008   Method used:   Faxed to ...       [facility] (mail-order)             , [place]         Ph:        Fax: [phone]   RxID:   [id number] CELEBREX 200 MG  CAPS (CELECOXIB) Take 1 capsule once daily  #90 x 3   Entered by:   Sherron Monday, CMA   Authorized by:   [name] MD   Signed by:   Sherron Monday, CMA on 03/19/2008   Method used:   Faxed to ...       [facility] (mail-order)             , [place]         Ph:        Fax: [phone]   RxID:   [id number] ZOLOFT 100 MG  TABS (SERTRALINE HCL) 1 by mouth once daily  #90 x 3   Entered by:   Sherron Monday, CMA   Authorized by:   [name] MD   Signed by:   Sherron Monday, CMA on 03/19/2008   Method used:   [facility] ([facility]order)             , [place]         Ph:        Fax: [phone]   RxID:   [id number] NORVASC 5 MG  TABS (AMLODIPINE BESYLATE) 1 by mouth once daily  #90 Tablet x 3   Entered by:   Sherron Monday, CMA   Authorized by:   [name] MD   Signed by:   Sherron Monday, CMA on 03/19/2008   Method used:   Faxed to ...       [facility] (mail-order)             , [place]         Ph:        Fax: [phone]   RxID:   [id number] FIORINAL/CODEINE #3 50-325-40-30 MG  CAPS  (BUTALBITAL-ASA-CAFF-CODEINE) Take 1-2 by mouth every 4-6 hours as needed. Max of 6 tablets in 24 hours.  #30 x 1   Entered and Authorized by:   [name] MD   Signed by:   [name] MD on 03/19/2008   Method used:   Print then Give to Patient   RxID:   [id number]  ]

## 2010-06-24 NOTE — Progress Notes (Signed)
Summary: REFILL REQUEST  Phone Note Refill Request   Refills Requested: Medication #1:  CRESTOR 10 MG  TABS 1 by mouth once daily   Notes: [facility]    Initial call taken by: Duanne Moron,  Oct 16, 2009 8:49 AM    Prescriptions: CRESTOR 10 MG  TABS (ROSUVASTATIN CALCIUM) 1 by mouth once daily  #90 x 0   Entered by:   Deanna Artis CMA   Authorized by:   [name] MD   Signed by:   Deanna Artis CMA on 10/16/2009   Method used:   Electronically to        [facility] (mail-order)             ,          Ph: [phone]       Fax: [phone]   RxID:   [id number]

## 2010-06-24 NOTE — Progress Notes (Signed)
Summary: refills  Phone Note Call from Patient Call back at Home Phone [phone]   Caller: Patient Summary of Call: Needs refills on premarin, zoloft and crestor sent to [facility]. I looked at pt's last ov note and she needed a rov back in April 2010. Pt scheduled a follow up appt. Initial call taken by: Sherron Monday, [place] Deborra Medina),  January 28, 2009 4:37 PM  Follow-up for Phone Call        Rx sent to pharmacy. Follow-up by: Sherron Monday, CMA (AAMA),  January 28, 2009 4:37 PM    Prescriptions: CRESTOR 10 MG  TABS (ROSUVASTATIN CALCIUM) 1 by mouth once daily  #90 x 0   Entered by:   Sherron Monday, CMA (AAMA)   Authorized by:   [name] MD   Signed by:   Sherron Monday, CMA (AAMA) on 01/28/2009   Method used:   Faxed to ...       [facility] (mail-order)             , [place]         Ph:        Fax: [phone]   RxID:   [id number] PREMARIN 0.3 MG  TABS (ESTROGENS CONJUGATED) 1 by mouth once daily  #90 x 0   Entered by:   Sherron Monday, CMA (AAMA)   Authorized by:   [name] MD   Signed by:   Sherron Monday, CMA (AAMA) on 01/28/2009   Method used:   Faxed to ...       [facility] (mail-order)             , [place]         Ph:        Fax: [phone]   RxID:   [id number] ZOLOFT 100 MG  TABS (SERTRALINE HCL) 1 by mouth once daily  #90 x 0   Entered by:   Sherron Monday, CMA (AAMA)   Authorized by:   [name] MD   Signed by:   Sherron Monday, CMA (AAMA) on 01/28/2009   Method used:   Faxed to ...       [facility] (mail-order)             , [place]         Ph:        Fax: [phone]   RxID:   [id number]

## 2010-06-24 NOTE — Letter (Signed)
Summary: [facility]  [facility]   Imported By: Laural Benes 04/07/2008 15:16:07  _____________________________________________________________________  External Attachment:    Type:   Image     Comment:   External Document

## 2010-06-24 NOTE — Letter (Signed)
Summary: patient history  patient history   Imported By: Jamelle Haring 01/12/2007 09:44:41  _____________________________________________________________________  External Attachment:    Type:   Image     Comment:   patient history

## 2010-06-25 NOTE — Progress Notes (Signed)
Summary: New patient Office Visit  New patient Office Visit   Imported By: Jamelle Haring 01/12/2007 09:47:45  _____________________________________________________________________  External Attachment:    Type:   Image     Comment:   new patient office note

## 2010-08-10 LAB — CBC
HCT: 48 % — ABNORMAL HIGH (ref 36.0–46.0)
Hemoglobin: 16.5 g/dL — ABNORMAL HIGH (ref 12.0–15.0)
MCHC: 34.3 g/dL (ref 30.0–36.0)
MCV: 92.3 fL (ref 78.0–100.0)
Platelets: 199 10*3/uL (ref 150–400)
RBC: 5.2 MIL/uL — ABNORMAL HIGH (ref 3.87–5.11)
RDW: 13.3 % (ref 11.5–15.5)
WBC: 5.1 10*3/uL (ref 4.0–10.5)

## 2010-08-10 LAB — DIFFERENTIAL
Basophils Absolute: 0 10*3/uL (ref 0.0–0.1)
Basophils Relative: 1 % (ref 0–1)
Eosinophils Absolute: 0.2 10*3/uL (ref 0.0–0.7)
Eosinophils Relative: 4 % (ref 0–5)
Lymphocytes Relative: 21 % (ref 12–46)
Lymphs Abs: 1.1 10*3/uL (ref 0.7–4.0)
Monocytes Absolute: 0.3 10*3/uL (ref 0.1–1.0)
Monocytes Relative: 7 % (ref 3–12)
Neutro Abs: 3.5 10*3/uL (ref 1.7–7.7)
Neutrophils Relative %: 68 % (ref 43–77)

## 2010-08-25 ENCOUNTER — Other Ambulatory Visit (INDEPENDENT_AMBULATORY_CARE_PROVIDER_SITE_OTHER): Payer: Medicare Other | Admitting: Internal Medicine

## 2010-08-25 DIAGNOSIS — I10 Essential (primary) hypertension: Secondary | ICD-10-CM

## 2010-08-25 DIAGNOSIS — E785 Hyperlipidemia, unspecified: Secondary | ICD-10-CM

## 2010-08-25 LAB — CBC WITH DIFFERENTIAL/PLATELET
Basophils Absolute: 0.1 10*3/uL (ref 0.0–0.1)
Basophils Relative: 1 % (ref 0.0–3.0)
Eosinophils Absolute: 0.3 10*3/uL (ref 0.0–0.7)
Eosinophils Relative: 5.9 % — ABNORMAL HIGH (ref 0.0–5.0)
HCT: 49.3 % — ABNORMAL HIGH (ref 36.0–46.0)
Hemoglobin: 16.8 g/dL — ABNORMAL HIGH (ref 12.0–15.0)
Lymphocytes Relative: 19.7 % (ref 12.0–46.0)
Lymphs Abs: 1.1 10*3/uL (ref 0.7–4.0)
MCHC: 34 g/dL (ref 30.0–36.0)
MCV: 92.3 fl (ref 78.0–100.0)
Monocytes Absolute: 0.4 10*3/uL (ref 0.1–1.0)
Monocytes Relative: 7.1 % (ref 3.0–12.0)
Neutro Abs: 3.7 10*3/uL (ref 1.4–7.7)
Neutrophils Relative %: 66.3 % (ref 43.0–77.0)
Platelets: 222 10*3/uL (ref 150.0–400.0)
RBC: 5.34 Mil/uL — ABNORMAL HIGH (ref 3.87–5.11)
RDW: 13.2 % (ref 11.5–14.6)
WBC: 5.6 10*3/uL (ref 4.5–10.5)

## 2010-08-25 LAB — HEPATIC FUNCTION PANEL
ALT: 31 U/L (ref 0–35)
AST: 31 U/L (ref 0–37)
Albumin: 3.9 g/dL (ref 3.5–5.2)
Alkaline Phosphatase: 76 U/L (ref 39–117)
Bilirubin, Direct: 0.1 mg/dL (ref 0.0–0.3)
Total Bilirubin: 0.9 mg/dL (ref 0.3–1.2)
Total Protein: 6.1 g/dL (ref 6.0–8.3)

## 2010-08-25 LAB — LIPID PANEL
Cholesterol: 258 mg/dL — ABNORMAL HIGH (ref 0–200)
HDL: 79.9 mg/dL (ref >39.00)
Total CHOL/HDL Ratio: 3
Triglycerides: 80 mg/dL (ref 0.0–149.0)
VLDL: 16 mg/dL (ref 0.0–40.0)

## 2010-08-25 LAB — BASIC METABOLIC PANEL
BUN: 23 mg/dL (ref 6–23)
CO2: 31 mEq/L (ref 19–32)
Calcium: 9.6 mg/dL (ref 8.4–10.5)
Chloride: 105 mEq/L (ref 96–112)
Creatinine, Ser: 0.9 mg/dL (ref 0.4–1.2)
GFR: 69.11 mL/min (ref >60.00)
Glucose, Bld: 90 mg/dL (ref 70–99)
Potassium: 4.4 mEq/L (ref 3.5–5.1)
Sodium: 144 mEq/L (ref 135–145)

## 2010-08-25 LAB — LDL CHOLESTEROL, DIRECT: Direct LDL: 152.9 mg/dL

## 2010-08-26 ENCOUNTER — Encounter: Payer: Self-pay | Admitting: Internal Medicine

## 2010-08-26 LAB — TSH: TSH: 0.97 u[IU]/mL (ref 0.35–5.50)

## 2010-09-01 ENCOUNTER — Encounter: Payer: Self-pay | Admitting: Internal Medicine

## 2010-09-01 ENCOUNTER — Ambulatory Visit (INDEPENDENT_AMBULATORY_CARE_PROVIDER_SITE_OTHER): Payer: Medicare Other | Admitting: Internal Medicine

## 2010-09-01 VITALS — BP 120/80 | HR 66 | Ht 66.0 in | Wt 170.0 lb

## 2010-09-01 DIAGNOSIS — F4323 Adjustment disorder with mixed anxiety and depressed mood: Secondary | ICD-10-CM

## 2010-09-01 DIAGNOSIS — M279 Disease of jaws, unspecified: Secondary | ICD-10-CM

## 2010-09-01 DIAGNOSIS — T887XXA Unspecified adverse effect of drug or medicament, initial encounter: Secondary | ICD-10-CM

## 2010-09-01 DIAGNOSIS — M1990 Unspecified osteoarthritis, unspecified site: Secondary | ICD-10-CM

## 2010-09-01 DIAGNOSIS — R0602 Shortness of breath: Secondary | ICD-10-CM

## 2010-09-01 DIAGNOSIS — I10 Essential (primary) hypertension: Secondary | ICD-10-CM

## 2010-09-01 DIAGNOSIS — R799 Abnormal finding of blood chemistry, unspecified: Secondary | ICD-10-CM

## 2010-09-01 DIAGNOSIS — E785 Hyperlipidemia, unspecified: Secondary | ICD-10-CM

## 2010-09-01 MED ORDER — CELECOXIB 200 MG PO CAPS: 200.0000 mg | ORAL_CAPSULE | Freq: Every day | ORAL | Status: DC

## 2010-09-01 MED ORDER — ALBUTEROL SULFATE HFA 108 (90 BASE) MCG/ACT IN AERS: 2.0000 | INHALATION_SPRAY | Freq: Four times a day (QID) | RESPIRATORY_TRACT | Status: DC | PRN

## 2010-09-01 MED ORDER — SERTRALINE HCL 100 MG PO TABS: 100.0000 mg | ORAL_TABLET | Freq: Every day | ORAL | Status: DC

## 2010-09-01 MED ORDER — CARBAMAZEPINE ER 300 MG PO CP12: 300.0000 mg | ORAL_CAPSULE | Freq: Two times a day (BID) | ORAL | Status: DC

## 2010-09-01 MED ORDER — BUTALBITAL-ASA-CAFF-CODEINE 50-325-40-30 MG PO CAPS: 1.0000 | ORAL_CAPSULE | ORAL | Status: DC | PRN

## 2010-09-01 MED ORDER — TRIAMTERENE-HCTZ 37.5-25 MG PO TABS: 1.0000 | ORAL_TABLET | Freq: Every day | ORAL | Status: DC

## 2010-09-01 MED ORDER — ESTROGENS CONJUGATED 0.3 MG PO TABS: ORAL_TABLET | ORAL | Status: DC

## 2010-09-01 MED ORDER — BUDESONIDE-FORMOTEROL FUMARATE 80-4.5 MCG/ACT IN AERO: 2.0000 | INHALATION_SPRAY | Freq: Two times a day (BID) | RESPIRATORY_TRACT | Status: DC

## 2010-09-01 MED ORDER — AMLODIPINE BESYLATE 5 MG PO TABS: 5.0000 mg | ORAL_TABLET | Freq: Every day | ORAL | Status: DC

## 2010-09-01 NOTE — Patient Instructions (Signed)
Ok to  Stay off the statin  Med for now. Mediterranean diet  And continue exercise . 3500 calories is the energy content of a pound of body weight .Must have a 3500 cal deficit to lose one pound . Thus decrease 500 calorie equivalent per day in food or drink intake / or exercise  for 7 days to lose one pound.

## 2010-09-02 ENCOUNTER — Encounter: Payer: Self-pay | Admitting: Internal Medicine

## 2010-09-02 ENCOUNTER — Telehealth: Payer: Self-pay | Admitting: *Deleted

## 2010-09-02 DIAGNOSIS — T887XXA Unspecified adverse effect of drug or medicament, initial encounter: Secondary | ICD-10-CM | POA: Insufficient documentation

## 2010-09-02 DIAGNOSIS — R0602 Shortness of breath: Secondary | ICD-10-CM

## 2010-09-02 DIAGNOSIS — Z974 Presence of external hearing-aid: Secondary | ICD-10-CM | POA: Insufficient documentation

## 2010-09-02 DIAGNOSIS — R0989 Other specified symptoms and signs involving the circulatory and respiratory systems: Secondary | ICD-10-CM

## 2010-09-02 DIAGNOSIS — R0609 Other forms of dyspnea: Secondary | ICD-10-CM

## 2010-09-02 DIAGNOSIS — R799 Abnormal finding of blood chemistry, unspecified: Secondary | ICD-10-CM | POA: Insufficient documentation

## 2010-09-02 HISTORY — DX: Unspecified adverse effect of drug or medicament, initial encounter: T88.7XXA

## 2010-09-02 NOTE — Progress Notes (Signed)
  Subjective:    Patient ID: [name], female    DOB: 1940-08-15, 70 y.o.   MRN: [id number]  HPI Patient was in today for follow-up of multiple medical problems. She recently had her laboratory studies including her lipid panel done for follow-up. Since her last visit she has had no major changes in her health status it did have an injury worksheet fell on the ice and cut her head. However she had no concussion or loss of consciousness. Hyperlipidemia: She has stopped the Crestor after waiting for about two months.  She has noted improvement in what she calls upper extremity soreness and weak feeling. Wonders if she had a side effect of the medication. She also delivers an Internet article on is your stats and drug killing you from [employer] HT: Controlled on current medication. OA:  About the same takes celebrated JAw pain facial pain Had side effects from Lyrica is now on Tegretol 300 mg twice a day seems to control the problem./ MOOD: Seems to be good on Zoloft  Tried to wean last year and didn't do as well so what about all medication no significant side effect. HA:  Only ocass  But uses fioricet for rescue for years   And no rebound or progression SOB : on  symbicort with help no progression of sx. Seems to be worsen allergy season HRT: Has been winning the Premarin and is only taking it now about Monday Wednesday Friday no current breakthrough  Review of Systems Negative for chest pain syncope major changes in hearing or vision. Rest as per HPI    Objective:   Physical Exam WDWN in nad  HEENT: Normocephalic ;atraumatic , Eyes;  PERRL, EOMs  Full, lids and conjunctiva clear,,Ears: no deformities, canals nl, TM landmarks normal, Nose: no deformity or discharge  Mouth : OP clear without lesion or edema . Neck no bruits or masses Chest:  Clear to A&P without rales or rhonchi   Rare exp wheeze right base  Good bs  CV:  S1-S2 no gallops or murmurs peripheral perfusion is  normal Abdomen:  Sof,t normal bowel sounds without hepatosplenomegaly, no guarding rebound or masses no CVA tenderness No clubbing cyanosis or edema  Neuro non focal  Mood :  Oriented x 3 and no noted deficits in memory, attention, and speech.  Not anxious or depressed appearing Labs reviewed        Assessment & Plan:  LIPIDS  Not too bad off meds   Disc goals  If ldlover 200 may need to try other interventions POSS se of 10 crestor   HT stable  SOB  Pulmonary allergy asthmatic cause  Elevated HG  eval in past  No change  No osa sx  MOOD: better on meds than off HA stable  Advise warning about med used but since stable and no se will continue. HRT: minimize and wean OA: no change on cox 2  No bleeding.  Facial pain :  No ob se of tegretol cbc ok. Continue .  Plan fu in 6 months

## 2010-09-02 NOTE — Telephone Encounter (Signed)
Left message on machine to call back  

## 2010-09-02 NOTE — Assessment & Plan Note (Signed)
Ok to stay off med for now  .   Keep ldl below 160 .  Poss se of the 10 crestor .   Daily.   Intensify lsi .   Risk benefit of medication discussed.  bp control and other factors improtant ain cv health.  Avoid most. supplements as untested .

## 2010-09-02 NOTE — Telephone Encounter (Signed)
Message copied by Paul Half on Thu Sep 02, 2010 12:21 PM ------      Message from: [facility] [place]      Created: Thu Sep 02, 2010 11:37 AM       Please check old records  When last PFTS were done and if not done after Dr werts visit in 12/09       Call patient and advise that this should be repeated before her next visit.  Dx SOB.       Thanks

## 2010-09-02 NOTE — Assessment & Plan Note (Signed)
Reviewed record as best possible after pt left .Marland Kitchen Had consult wert 12 2009 and put on inhaler and supposed to have gotten repeat pfts because previous ones suggested VCD and poss gerd contributing to this Was supposed to follow up at some point  Unclear if full evaluation ever dont. . Will investigate  To see  when last pfts done  And if not done will contact patient to get this done.

## 2010-09-06 NOTE — Telephone Encounter (Signed)
Pt aware and order sent to [facility].

## 2010-09-20 ENCOUNTER — Ambulatory Visit (INDEPENDENT_AMBULATORY_CARE_PROVIDER_SITE_OTHER): Payer: Medicare Other | Admitting: Internal Medicine

## 2010-09-20 DIAGNOSIS — R0602 Shortness of breath: Secondary | ICD-10-CM

## 2010-09-20 LAB — PULMONARY FUNCTION TEST

## 2010-09-20 NOTE — Progress Notes (Signed)
PFT done today. 

## 2010-10-05 NOTE — Assessment & Plan Note (Signed)
[facility]                            CARDIOLOGY OFFICE NOTE   [name], [name]                     MRN:          [id number]  DATE:04/10/2007                            DOB:          04/03/1941    PRIMARY CARE PHYSICIAN:  Dr. [name].   REASON FOR PRESENTATION:  Evaluate patient with dyspnea.   HISTORY OF PRESENT ILLNESS:  The patient presents for followup of the  above.  She had a mildly abnormal echocardiogram and the complaint of  increased shortness of breath walking stairs.  The echocardiogram, I did  not think, demonstrated any significant pathology to explain her  dyspnea.  She does have some distal septal hypokinesis, and I sent her  for a stress perfusion study.  This demonstrated an ejection fraction of  73% with no evidence of ischemia or infarct.  She had a couple of wide  complex beats during this study, but no sustained arrhythmias.   The patient has since been on a cruise.  She has not had any new  symptoms, though she has not been exercising as much.  She has gained a  little weight on the cruise.  She denies any chest pain, neck or arm  discomfort, she is not having any palpitations, presyncope, or syncope.   PAST MEDICAL HISTORY:  1. Hypertension x10 years.  2. Hysterectomy.  3. Oophorectomy.   ALLERGIES:  BACTRIM.   MEDICATIONS:  1. Celebrex 200 mg daily.  2. Triamterine hydrochlorothiazide 37.5/25 daily.  3. Zoloft 100 mg daily.  4. Norvasc 5 mg daily.  5. Premarin.  6. Zegerid 4 mg daily.  7. Advair.   REVIEW OF SYSTEMS:  As stated in the HPI and otherwise negative for  other systems.   PHYSICAL EXAMINATION:  The patient is in no distress.  Blood pressure 131/84, heart rate 70 and regular, weight 167 pounds,  body mass index 24.  HEENT:  Eyelids unremarkable, pupils are equal, round, and reactive to  light, fundi not visualized.  Oral mucosa unremarkable.  NECK:  No jugular venous distension at 45  degrees, carotid upstroke  brisk and symmetrical.  No bruit.  No thyromegaly.  LYMPHATICS:  No adenopathy.  LUNGS:  Clear to auscultation bilaterally.  BACK:  No costovertebral angle tenderness.  CHEST:  Unremarkable.  HEART:  PMI not displaced or sustained.  S1 and S2 are within normal  limits.  No S3, no S4.  No clicks, no rubs, no murmurs.  ABDOMEN:  Flat, positive bowel sounds, normal in frequency and pitch.  No bruits, no rebound, no guarding.  No midline pulsatile mass.  No  hepatomegaly, no splenomegaly.  SKIN:  No rashes, no nodules.  EXTREMITIES:  2+ pulses throughout, no edema.  No cyanosis, no clubbing.  NEURO:  Oriented to person, place, and time.  Cranial nerves II-XII  grossly intact, motor grossly intact.   ASSESSMENT AND PLAN:  1. Dyspnea:  The patient's dyspnea does not seem to have a cardiac      etiology.  She has recently renewed her prescription for Advair      that was  given to her by Dr. Duwayne Heck a couple of years ago.  She      is going to restart this and see how her symptoms are with this.      If she continues to have complaints, I have encouraged her to      follow with Dr. Regis Bill, or back with the pulmonary department.  2. Followup:  I will see her back as needed.     Minus Breeding, MD, [facility]  Electronically Signed    JH/MedQ  DD: 04/10/2007  DT: 04/11/2007  Job #: [id number]   cc:   [name]. Regis Bill, MD

## 2010-10-05 NOTE — Assessment & Plan Note (Signed)
[facility]                            CARDIOLOGY OFFICE NOTE   [name], [name]                     MRN:          [id number]  DATE:03/06/2007                            DOB:          19-Nov-1940    REASON FOR PRESENTATION:  Evaluate patient with dyspnea and an abnormal  echocardiogram.   HISTORY OF PRESENT ILLNESS:  The patient is a pleasant 70 year old white  female with a past history of chest discomfort in 1999. She had a  cardiac catheterization with normal coronaries. 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 Over the last six months, she has had  increasing dyspnea walking up a flight of stairs or walking 1 to 1-1/2  miles per day. She has to stop in the middle of her walk and catch her  breath and go on. She will get some mild chest discomfort with this as  well. This has been slowly progressive. She did see a pulmonologist  though I do not have that note. She was given Advair, which she will  take occasionally. She was most recently sent for an echocardiogram  which demonstrated a mildly dilated left ventricle with some mild distal  septal hypokinesis. There was mild calcification of the aortic valve and  mild aortic root dilatation. This patient says that when she gets the  shortness of breath or chest discomfort she does not have associated  symptoms such as nausea, vomiting or diaphoresis. She does not describe  any palpitations, pre-syncope or syncope. She is not describing any PND  or orthopnea. There is no radiation of the discomfort. It seems to be  substernal.   PAST MEDICAL HISTORY:  Hypertension x10 years.   PAST SURGICAL HISTORY:  1. Hysterectomy.  2. Oophorectomy.   ALLERGIES:  BACTRIM.   CURRENT MEDICATIONS:  1. Celebrex 200 mg daily.  2. Triamterene/hydrochlorothiazide 37.5/25 daily.  3. Zoloft 100 mg daily.  4. Norvasc 5 mg daily.  5. Premarin 3 mg daily.  6. Zegerid 4 mg daily.  7. Advair.   SOCIAL HISTORY:  The patient is retired. She is married and has two  children and two grandchildren. She does not smoke cigarettes and never  has. She occasionally drinks alcohol.   REVIEW OF SYSTEMS:  As stated in the HPI and negative for other systems.   PHYSICAL EXAMINATION:  The patient is in no distress. Blood pressure  130/80, heart rate 66 and regular. Weight is 164 pounds. Body Mass Index  is 24.  HEENT: Eyelids unremarkable. Pupils equal, round, and reactive to light.  Fundi not visualized. Oral mucosa is unremarkable.  NECK: No jugular venous distention at 45 degrees.  Carotid upstroke  brisk and symmetrical. No bruits, no thyromegaly.  LYMPHATICS: No cervical, axillary or inguinal adenopathy.  LUNGS: Clear to auscultation bilaterally.  BACK: No costovertebral angle tenderness.  CHEST: Unremarkable.  HEART: PMI not displaced or sustained. S1, S2 within normal limits. No  S3. No S4. No clicks, rub or murmurs.  ABDOMEN: Flat, positive bowel sounds, normal in frequency and pitch. No  bruits. No rebounds. No guarding. No midline pulsatile mass.  No  hepatomegaly or splenomegaly.  SKIN: No rashes, no nodules.  EXTREMITIES: 2+ pulses throughout. No edema, cyanosis or clubbing.  NEURO: Oriented to person, place and time. Cranial nerves II-XII grossly  intact. Motor grossly intact throughout.   EKG: Sinus rhythm, rate 66, axis within normal limits, intervals within  normal limits. Poor anterior R-wave progression. Cannot exclude  anteroseptal infarct.   ASSESSMENT/PLAN:  1. Dyspnea: The patient has progressive dyspnea with some chest      pressure. She has mild wall motion abnormality on her      echocardiogram and a borderline EKG. Give this, the pre-test      probability of obstructive coronary disease is moderate. Therefore,      she needs screening with a stress perfusion study. Further      evaluation based on these results. If this is normal, I may      consider a  cardiopulmonary stress test.  2. Hypertension: Blood pressure is well-controlled and she will      continue the medications as listed.  3. Followup: Will see the patient again based on the results of the      above.     Minus Breeding, MD, [facility]  Electronically Signed    JH/MedQ  DD: 03/06/2007  DT: 03/07/2007  Job #: [id number]   cc:   [name]. Regis Bill, MD

## 2010-11-05 ENCOUNTER — Encounter: Payer: Self-pay | Admitting: Internal Medicine

## 2011-02-01 ENCOUNTER — Ambulatory Visit (INDEPENDENT_AMBULATORY_CARE_PROVIDER_SITE_OTHER): Payer: Medicare Other | Admitting: Internal Medicine

## 2011-02-01 ENCOUNTER — Encounter: Payer: Self-pay | Admitting: Internal Medicine

## 2011-02-01 VITALS — BP 120/80 | HR 60 | Wt 165.0 lb

## 2011-02-01 DIAGNOSIS — R51 Headache: Secondary | ICD-10-CM

## 2011-02-01 DIAGNOSIS — R519 Headache, unspecified: Secondary | ICD-10-CM

## 2011-02-01 DIAGNOSIS — S0990XA Unspecified injury of head, initial encounter: Secondary | ICD-10-CM | POA: Insufficient documentation

## 2011-02-01 DIAGNOSIS — S0101XA Laceration without foreign body of scalp, initial encounter: Secondary | ICD-10-CM

## 2011-02-01 DIAGNOSIS — R55 Syncope and collapse: Secondary | ICD-10-CM

## 2011-02-01 DIAGNOSIS — S0100XA Unspecified open wound of scalp, initial encounter: Secondary | ICD-10-CM

## 2011-02-01 HISTORY — DX: Syncope and collapse: R55

## 2011-02-01 HISTORY — DX: Unspecified injury of head, initial encounter: S09.90XA

## 2011-02-01 NOTE — Progress Notes (Signed)
  Subjective:    Patient ID: [name], female    DOB: Dec 25, 1940, 70 y.o.   MRN: [id number]  HPI Patient comes in today to have stitches taken out on her scalp in the occipital area. One week ago she sustained a scalp laceration after her fainting or loss of consciousness when she was at [facility]. He states she awoke with a headache that day he went into the shower and reached for a towel and felt nauseous and fainted hit her head and had difficulty getting up. Her husband had to call for help to get her to the hospital. Her evaluation included a chest x-ray a head CT an EKG and some laboratory studies. She had 8 sutures in her scalp and by the time she left she felt a lot better.   She denies palpitations visual changes numbness or focal weakness. However she's been having more morning headaches recently that are more severe. Only taking occasional narcotic medicine. She is on carbamazepine for pain per Dr. Jannifer Franklin for facial pain. She says it does make her drowsy at times.  She has no history of syncope head trauma, concussion palpitations chest pain. She cannot say why she  had loss of consciousness. There was no seizure activity or incontinence.   Review of Systems Neg fever cough bleeding  resta as per hpi   Past Medical History  Diagnosis Date  . GERD (gastroesophageal reflux disease)   . Hyperlipidemia   . Hypertension     echo nl lv function  mild dilitation 2009  . Migraine     hypnic HA eval by Dr. Earley Favor in the past  . Abnormal blood finding     elevated Hg and hct    . 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 Hearing aid worn    Past Surgical History  Procedure Date  . Doppler echocardiography 2009    nl lv function mild lv dilitation  . Abdominal hysterectomy 2002    tubal  . Cardiac catheterization 2000    chest pains neg  . Rt shoulder surgery     reports that she has never smoked. She does not have any smokeless tobacco history on file. She reports that she  drinks alcohol. Her drug history not on file. family history includes Alcohol abuse in her father; Hypertension in an unspecified family member; Ovarian cancer in her mother; Seizures in her daughter; and Stroke in an unspecified family member. Allergies  Allergen Reactions  . Sulfamethoxazole W/Trimethoprim     REACTION: unspecified       Objective:   Physical Exam WDWN in nad Looks well Head: no bruising  Posterior scalp with healing laceration 8 sutures removed  Wound looks good no redness ofinfection Eyes EPMS full  Tongue midline  Neck no masses Chest:  Clear to A&P without wheezes rales or rhonchi CV:  S1-S2 no gallops or murmurs peripheral perfusion is normal No clubbing cyanosis or edema Neuro grossly non focal     Assessment & Plan:  Scalp laceration  Suture removal wound looks good  Syncope loss of consciousness  ? Cause ? Vagal no hx of same  Am headaches progressing.    Facial pain on meds per Dr Jannifer Franklin.

## 2011-02-01 NOTE — Patient Instructions (Signed)
Will arrange  appt with Dr Daymon Larsen al for  Syncope and headache follow up.   Call in the meantime if needed

## 2011-02-01 NOTE — Assessment & Plan Note (Signed)
Poss V asal Vagal but no hx of same and worsening am headaches.   Will arrange appt with Dr Jannifer Franklin her neurologist . ? Need for any other evaluation in order.

## 2011-03-02 ENCOUNTER — Other Ambulatory Visit (INDEPENDENT_AMBULATORY_CARE_PROVIDER_SITE_OTHER): Payer: Medicare Other

## 2011-03-02 DIAGNOSIS — E785 Hyperlipidemia, unspecified: Secondary | ICD-10-CM

## 2011-03-02 LAB — LIPID PANEL
Cholesterol: 259 mg/dL — ABNORMAL HIGH (ref 0–200)
HDL: 91.9 mg/dL (ref >39.00)
Total CHOL/HDL Ratio: 3
Triglycerides: 100 mg/dL (ref 0.0–149.0)
VLDL: 20 mg/dL (ref 0.0–40.0)

## 2011-03-02 LAB — LDL CHOLESTEROL, DIRECT: Direct LDL: 152.2 mg/dL

## 2011-03-03 ENCOUNTER — Other Ambulatory Visit: Payer: Medicare Other

## 2011-03-10 ENCOUNTER — Ambulatory Visit (INDEPENDENT_AMBULATORY_CARE_PROVIDER_SITE_OTHER): Payer: Medicare Other | Admitting: Internal Medicine

## 2011-03-10 ENCOUNTER — Encounter: Payer: Self-pay | Admitting: Internal Medicine

## 2011-03-10 VITALS — BP 120/80 | HR 66 | Wt 163.0 lb

## 2011-03-10 DIAGNOSIS — Z23 Encounter for immunization: Secondary | ICD-10-CM

## 2011-03-10 DIAGNOSIS — E785 Hyperlipidemia, unspecified: Secondary | ICD-10-CM

## 2011-03-10 NOTE — Assessment & Plan Note (Signed)
Much better with ls changes  Dec simple carbs and weight loss. Consider intensify resistance training. Continue  Check in 6 months at wellness.

## 2011-03-10 NOTE — Patient Instructions (Signed)
Continue lifestyle intervention healthy eating and exercise . Wellness visit in 6 months .

## 2011-03-10 NOTE — Progress Notes (Signed)
  Subjective:    Patient ID: [name], female    DOB: [birth year], 70 y.o.   MRN: [id number]  HPI  Follow up  Of lipids    See last OV  She is now off o crestor for 6 months  Stopped eating wheat barley and rye.   Other simple carbs . Diet  Of rice and corn in limited amount .  Fruits and veges and lean meats. Has lost a few  pounds  And exercises 3 x per week at least and feels well.  Review of Systems Neg cp no falling    Past history family history social history reviewed in the electronic medical record.     Objective:   Physical Exam WDWN in nad looks well Labs reviewed with patient. Lab Results  Component Value Date   CHOL 259* 03/02/2011   CHOL 258* 08/25/2010   CHOL 203* 11/05/2009   Lab Results  Component Value Date   HDL 91.90 03/02/2011   HDL 79.90 08/25/2010   HDL 82.20 11/05/2009   Lab Results  Component Value Date   LDLCALC 92 02/25/2008   LDLCALC 88 05/30/2007   Lab Results  Component Value Date   TRIG 100.0 03/02/2011   TRIG 80.0 08/25/2010   TRIG 125.0 11/05/2009   Lab Results  Component Value Date   CHOLHDL 3 03/02/2011   CHOLHDL 3 08/25/2010   CHOLHDL 2 11/05/2009   Lab Results  Component Value Date   LDLDIRECT 152.2 03/02/2011   LDLDIRECT 152.9 08/25/2010   LDLDIRECT 89.1 11/05/2009        Assessment & Plan:  LIPIDS  : stable off of crestor  No compelling indication fo statin at this time. Counseled. continue avoiding simple carb diet etc .   Total visit 66mns > 50% spent counseling and coordinating care

## 2011-04-19 ENCOUNTER — Telehealth: Payer: Self-pay | Admitting: *Deleted

## 2011-04-19 ENCOUNTER — Ambulatory Visit (INDEPENDENT_AMBULATORY_CARE_PROVIDER_SITE_OTHER): Payer: Medicare Other | Admitting: Internal Medicine

## 2011-04-19 ENCOUNTER — Encounter: Payer: Self-pay | Admitting: Internal Medicine

## 2011-04-19 VITALS — BP 120/70 | HR 66 | Wt 165.0 lb

## 2011-04-19 DIAGNOSIS — L089 Local infection of the skin and subcutaneous tissue, unspecified: Secondary | ICD-10-CM

## 2011-04-19 DIAGNOSIS — IMO0002 Reserved for concepts with insufficient information to code with codable children: Secondary | ICD-10-CM

## 2011-04-19 MED ORDER — CEPHALEXIN 500 MG PO CAPS: 500.0000 mg | ORAL_CAPSULE | Freq: Three times a day (TID) | ORAL | Status: AC

## 2011-04-19 NOTE — Progress Notes (Signed)
  Subjective:    Patient ID: [name], female    DOB: 06/08/1940, 70 y.o.   MRN: [id number]  HPI Patient comes in today for SDA  For acute problem evaluation. Was zip lining in  [place] 8 days agi and rope hit foot and treated blister with topical and care  . No oozing  Fever  Sore to touch at times recenty   Review of Systems NO fever cough gi gu issues.  Current  new Past history family history social history reviewed in the electronic medical record. No hx of mrsa    Objective:   Physical Exam  Right foot arch area  2 cm ulcer   Shallow and  Round.  No exudate  Some redness proximally with slight tenderness   No streaking  Area of redness about 2-3 cm  Rest of foot normal . Gait  normal       Assessment & Plan:  Abrasion foot.   Early infection       Med discussed kelflex 500 tid  Continue local care and add antibiotic  Is going [place] of [place] in 3 days if needed with alarm features .

## 2011-04-19 NOTE — Telephone Encounter (Signed)
Patient has just came home from a vacation where she injured her right foot.  She has a rope burn from a z-line.  This happened about a week ago.  It is red, swollen, raw and very sore.  She would like to be seen today if possible.

## 2011-04-19 NOTE — Telephone Encounter (Signed)
Made appt for 2:45. Called pt & LMOM

## 2011-04-19 NOTE — Patient Instructions (Signed)
Continue topical care  Cover and antibiotic  Add oral antibiotic  Call if redness if  Redness not getting better over the next few days.

## 2011-04-19 NOTE — Telephone Encounter (Signed)
Pt came in for appt.

## 2011-08-24 ENCOUNTER — Other Ambulatory Visit: Payer: Self-pay

## 2011-08-24 MED ORDER — TRIAMTERENE-HCTZ 37.5-25 MG PO TABS: 1.0000 | ORAL_TABLET | Freq: Every day | ORAL | Status: DC

## 2011-08-24 NOTE — Telephone Encounter (Signed)
Rx sent to [facility].

## 2011-09-12 ENCOUNTER — Other Ambulatory Visit (INDEPENDENT_AMBULATORY_CARE_PROVIDER_SITE_OTHER): Payer: Medicare Other

## 2011-09-12 DIAGNOSIS — Z0000 Encounter for general adult medical examination without abnormal findings: Secondary | ICD-10-CM

## 2011-09-12 DIAGNOSIS — E785 Hyperlipidemia, unspecified: Secondary | ICD-10-CM

## 2011-09-12 LAB — HEPATIC FUNCTION PANEL
ALT: 25 U/L (ref 0–35)
AST: 29 U/L (ref 0–37)
Albumin: 4.4 g/dL (ref 3.5–5.2)
Alkaline Phosphatase: 78 U/L (ref 39–117)
Bilirubin, Direct: 0.1 mg/dL (ref 0.0–0.3)
Total Bilirubin: 0.8 mg/dL (ref 0.3–1.2)
Total Protein: 6.6 g/dL (ref 6.0–8.3)

## 2011-09-12 LAB — BASIC METABOLIC PANEL
BUN: 17 mg/dL (ref 6–23)
CO2: 26 mEq/L (ref 19–32)
Calcium: 9.5 mg/dL (ref 8.4–10.5)
Chloride: 98 mEq/L (ref 96–112)
Creatinine, Ser: 0.8 mg/dL (ref 0.4–1.2)
GFR: 77.13 mL/min (ref >60.00)
Glucose, Bld: 94 mg/dL (ref 70–99)
Potassium: 3.9 mEq/L (ref 3.5–5.1)
Sodium: 135 mEq/L (ref 135–145)

## 2011-09-12 LAB — CBC WITH DIFFERENTIAL/PLATELET
Basophils Absolute: 0 10*3/uL (ref 0.0–0.1)
Basophils Relative: 0.6 % (ref 0.0–3.0)
Eosinophils Absolute: 0.2 10*3/uL (ref 0.0–0.7)
Eosinophils Relative: 4.1 % (ref 0.0–5.0)
HCT: 47.5 % — ABNORMAL HIGH (ref 36.0–46.0)
Hemoglobin: 16.3 g/dL — ABNORMAL HIGH (ref 12.0–15.0)
Lymphocytes Relative: 18.3 % (ref 12.0–46.0)
Lymphs Abs: 0.9 10*3/uL (ref 0.7–4.0)
MCHC: 34.3 g/dL (ref 30.0–36.0)
MCV: 91.3 fl (ref 78.0–100.0)
Monocytes Absolute: 0.3 10*3/uL (ref 0.1–1.0)
Monocytes Relative: 6.9 % (ref 3.0–12.0)
Neutro Abs: 3.3 10*3/uL (ref 1.4–7.7)
Neutrophils Relative %: 70.1 % (ref 43.0–77.0)
Platelets: 203 10*3/uL (ref 150.0–400.0)
RBC: 5.2 Mil/uL — ABNORMAL HIGH (ref 3.87–5.11)
RDW: 13 % (ref 11.5–14.6)
WBC: 4.7 10*3/uL (ref 4.5–10.5)

## 2011-09-12 LAB — POCT URINALYSIS DIPSTICK
Bilirubin, UA: NEGATIVE
Blood, UA: NEGATIVE
Glucose, UA: NEGATIVE
Ketones, UA: NEGATIVE
Leukocytes, UA: NEGATIVE
Nitrite, UA: NEGATIVE
Protein, UA: NEGATIVE
Spec Grav, UA: 1.015
Urobilinogen, UA: 0.2
pH, UA: 7.5

## 2011-09-12 LAB — LIPID PANEL
Cholesterol: 267 mg/dL — ABNORMAL HIGH (ref 0–200)
HDL: 111.2 mg/dL (ref >39.00)
Total CHOL/HDL Ratio: 2
Triglycerides: 64 mg/dL (ref 0.0–149.0)
VLDL: 12.8 mg/dL (ref 0.0–40.0)

## 2011-09-13 LAB — LDL CHOLESTEROL, DIRECT: Direct LDL: 148.4 mg/dL

## 2011-09-13 LAB — TSH: TSH: 1 u[IU]/mL (ref 0.35–5.50)

## 2011-09-19 ENCOUNTER — Ambulatory Visit (INDEPENDENT_AMBULATORY_CARE_PROVIDER_SITE_OTHER): Payer: Medicare Other | Admitting: Internal Medicine

## 2011-09-19 ENCOUNTER — Encounter: Payer: Self-pay | Admitting: Internal Medicine

## 2011-09-19 VITALS — BP 128/88 | HR 74 | Temp 97.8°F | Ht 65.5 in | Wt 152.0 lb

## 2011-09-19 DIAGNOSIS — D751 Secondary polycythemia: Secondary | ICD-10-CM

## 2011-09-19 DIAGNOSIS — G500 Trigeminal neuralgia: Secondary | ICD-10-CM

## 2011-09-19 DIAGNOSIS — R799 Abnormal finding of blood chemistry, unspecified: Secondary | ICD-10-CM

## 2011-09-19 DIAGNOSIS — Z0000 Encounter for general adult medical examination without abnormal findings: Secondary | ICD-10-CM

## 2011-09-19 DIAGNOSIS — Z79890 Hormone replacement therapy (postmenopausal): Secondary | ICD-10-CM

## 2011-09-19 DIAGNOSIS — Z1211 Encounter for screening for malignant neoplasm of colon: Secondary | ICD-10-CM

## 2011-09-19 DIAGNOSIS — D45 Polycythemia vera: Secondary | ICD-10-CM

## 2011-09-19 DIAGNOSIS — IMO0002 Reserved for concepts with insufficient information to code with codable children: Secondary | ICD-10-CM

## 2011-09-19 DIAGNOSIS — Z974 Presence of external hearing-aid: Secondary | ICD-10-CM

## 2011-09-19 DIAGNOSIS — Z23 Encounter for immunization: Secondary | ICD-10-CM

## 2011-09-19 DIAGNOSIS — E785 Hyperlipidemia, unspecified: Secondary | ICD-10-CM

## 2011-09-19 DIAGNOSIS — I10 Essential (primary) hypertension: Secondary | ICD-10-CM

## 2011-09-19 DIAGNOSIS — L089 Local infection of the skin and subcutaneous tissue, unspecified: Secondary | ICD-10-CM

## 2011-09-19 MED ORDER — DOXYCYCLINE HYCLATE 100 MG PO CAPS: 100.0000 mg | ORAL_CAPSULE | Freq: Two times a day (BID) | ORAL | Status: AC

## 2011-09-19 MED ORDER — ESTROGENS CONJUGATED 0.3 MG PO TABS: 0.3000 mg | ORAL_TABLET | Freq: Every day | ORAL | Status: DC

## 2011-09-19 NOTE — Patient Instructions (Signed)
In regard to your blood pressure you can decrease the fluid pill to half a pill once a day and see how your blood pressure readings are after a month or 2 if they are below 130 call for advice about stopping this medication.  Somewhat we'll contact you about a hematology consult about your persistent elevated hemoglobin and hematocrit.  Will do routine referral for colonoscopy screening.  Check on the pneumonia shot he probably had it and please let us know when it was done. If not done you can return for shot only.  Compresses 2 foot had antibiotic avoid further trauma expect improvement in the next 3-5 days. Contact us if persistent or progressive.  ROV in 6 months to reevaluate meds and BP plan.   It is possible your positional dizziness could be from medication however if persistent or progressive discussed this with Dr. Jannifer Franklin. We'll get a copy of lab work to him.

## 2011-09-19 NOTE — Progress Notes (Signed)
Subjective:    Patient ID: [name], female    DOB: 02-07-1941, 71 y.o.   MRN: [id number]  HPI Patient comes in today for preventive visit and follow-up of medical issues. Update  history since  last visit: See last noted and fall in  Sees dr Jannifer Franklin for atypical facial pain trigeminal neuralgia right  and is on topamax for this but weaning and to stay on carbamazepime   Also hx of migraines HT controlled and no se of meds  Trying to wean this  Can she stop? Some  asthma stable  On symbicort  Spring and fall  On hrt 2 x per week and wants to stay on this  Was sweeping in garage and feet abrasion again and left foot tender ;no fever     Hearing: ok  Has hearing aid   Vision:  No limitations at present .  Safety:  Has smoke detector and wears seat belts.  No firearms. No excess sun exposure. Sees dentist regularly.  Falls:  See above  And past hx   Advance directive :  Reviewed  Has one. Brigs in today for ehr  Memory: Felt to be good  , no concern from her or her family.  Depression: No anhedonia unusual crying or depressive symptoms  Nutrition: Eats well balanced diet; adequate calcium and vitamin D. No swallowing chewiing problems.  Injury: no major injuries in the last six months.  Other healthcare providers:  Reviewed today .  Social:  Lives with husband married. No pets.   Preventive parameters: up-to-date on colonoscopy, mammogram, immunizations. Including Tdap and pneumovax.  ADLS:   There are no problems or need for assistance  driving, feeding, obtaining food, dressing, toileting and bathing, managing money using phone. She is independent.  Exercises silver sneakers  etoh less one a day  No tobacco    Review of Systems ROS:  GEN/ HEENT: No fever, significant weight changes sweats headaches vision problems hearing changes, CV/ PULM; No chest pain shortness of breath cough, syncope,edema  change in exercise tolerance. GI /GU: No adominal pain, vomiting,  change in bowel habits. No blood in the stool. No significant GU symptoms. SKIN/HEME: ,no acute skin rashes suspicious lesions or bleeding. No lymphadenopathy, nodules, masses.  NEURO/ PSYCH:  No  weakness numbness. No depression anxiety. Dizzy at times IMM/ Allergy: No unusual infections.  Allergy .   REST of 12 system review negative except as per HPI Outpatient Encounter Prescriptions as of 09/19/2011  Medication Sig Dispense Refill  . albuterol (PROVENTIL HFA;VENTOLIN HFA) 108 (90 BASE) MCG/ACT inhaler Inhale 2 puffs into the lungs every 6 (six) hours as needed.  3 Inhaler  0  . amLODipine (NORVASC) 5 MG tablet Take 1 tablet (5 mg total) by mouth daily.  90 tablet  3  . budesonide-formoterol (SYMBICORT) 80-4.5 MCG/ACT inhaler Inhale 2 puffs into the lungs 2 (two) times daily.  3 Inhaler  3  . butalbital-aspirin-caffeine-codeine (FIORINAL WITH CODEINE) 50-325-40-30 MG capsule Take 1 capsule by mouth every 4 (four) hours as needed for migraine. 1-2 by mouth every 4-6 hours as needed max of 6 tabs in 24 hours  30 capsule  1  . carbamazepine (CARBATROL) 300 MG 12 hr capsule Take 300 mg by mouth 3 (three) times daily.        . celecoxib (CELEBREX) 200 MG capsule Take 1 capsule (200 mg total) by mouth daily.  90 capsule  3  . estrogens, conjugated, (PREMARIN) 0.3 MG tablet 1 tab twice  a week       . sertraline (ZOLOFT) 100 MG tablet Take 1 tablet (100 mg total) by mouth daily.  90 tablet  3  . triamterene-hydrochlorothiazide (MAXZIDE-25) 37.5-25 MG per tablet Take 1 each (1 tablet total) by mouth daily.  90 tablet  0       Objective:   Physical Exam BP 128/88  Pulse 74  Temp(Src) 97.8 F (36.6 C) (Oral)  Ht 5' 5.5" (1.664 m)  Wt 152 lb (68.947 kg)  BMI 24.91 kg/m2  SpO2 97% Physical Exam: Vital signs reviewed PFX:TKWI is a well-developed well-nourished alert cooperative  white female who appears her stated age in no acute distress.  HEENT: normocephalic atraumatic , Eyes: PERRL EOM's  full, conjunctiva clear, Nares: paten,t no deformity discharge or tenderness., Ears: no deformity EAC's clear TMs with normal landmarks. Mouth: clear OP, no lesions, edema.  Moist mucous membranes. Dentition in adequate repair. NECK: supple without masses, thyromegaly or bruits. Breast: normal by inspection . No dimpling, discharge, masses, tenderness or discharge . CHEST/PULM:  Clear to auscultation and percussion breath sounds equal no wheeze , rales or rhonchi. No chest wall deformities or tenderness. CV: PMI is nondisplaced, S1 S2 no gallops, murmurs, rubs. Peripheral pulses are full without delay.No JVD .  ABDOMEN: Bowel sounds normal nontender  No guard or rebound, no hepato splenomegal no CVA tenderness.  No hernia. Extremtities:  No clubbing cyanosis or edema, no acute joint swelling or redness no focal atrophy NEURO:  Oriented x3, cranial nerves 3-12 appear to be intact, no obvious focal weakness,gait within normal limits no abnormal reflexes or asymmetrical SKIN: normal turgor, color, no bruising or petechiae. Left foot with abrasion  round red warm area without discharege that is tender right foot also with scab and redness not as tender.  PSYCH: Oriented, good eye contact, no obvious depression anxiety, cognition and judgment appear normal. LN: no cervical axillary inguinal adenopathy    Lab Results  Component Value Date   WBC 4.7 09/12/2011   HGB 16.3* 09/12/2011   HCT 47.5* 09/12/2011   PLT 203.0 09/12/2011   GLUCOSE 94 09/12/2011   CHOL 267* 09/12/2011   TRIG 64.0 09/12/2011   HDL 111.20 09/12/2011   LDLDIRECT 148.4 09/12/2011   LDLCALC 92 02/25/2008   ALT 25 09/12/2011   AST 29 09/12/2011   NA 135 09/12/2011   K 3.9 09/12/2011   CL 98 09/12/2011   CREATININE 0.8 09/12/2011   BUN 17 09/12/2011   CO2 26 09/12/2011   TSH 1.00 09/12/2011        Assessment & Plan:  Preventive Health Care Counseled regarding healthy nutrition, exercise, sleep, injury prevention, calcium vit d and  healthy weight . shingels and tdap today   Check into pneumovax probably had this.  Sent for colonoscopy as due.   Asthma  Uses controller meds in season spring and fall  And prn rescue .   Polycythemia? stable and  Apparently not progressing vut persisting  Over years without cause.   No sx  Will get heme consult  For opinion  About advice on monitoring and   HT  Can decrease to 1/2 pill diuretic and follow an call for advice  rov in 6 months.  Facial pain and ha per Dr Rolanda Lundborg se of med  And disc  Fall prevention  Fu if  persistent or progressive with dr Jannifer Franklin.  Foot infection skin again after hitting foot sweeping in garage.   Mood stable at this time  and continue  HRT wants to remain on meds 2 x per week  Get mammogram utd.

## 2011-09-20 DIAGNOSIS — D751 Secondary polycythemia: Secondary | ICD-10-CM | POA: Insufficient documentation

## 2011-09-22 ENCOUNTER — Telehealth: Payer: Self-pay | Admitting: Oncology

## 2011-09-22 ENCOUNTER — Encounter: Payer: Self-pay | Admitting: Oncology

## 2011-09-22 NOTE — Telephone Encounter (Signed)
Referred by Dr. Regis Bill Dx-Polycythemia

## 2011-09-22 NOTE — Telephone Encounter (Signed)
Called pt scheduled new pt appt for 05/09.  faxed a letter to Dr. Regis Bill with appt d/t

## 2011-09-24 DIAGNOSIS — Z79890 Hormone replacement therapy (postmenopausal): Secondary | ICD-10-CM | POA: Insufficient documentation

## 2011-09-24 DIAGNOSIS — L089 Local infection of the skin and subcutaneous tissue, unspecified: Secondary | ICD-10-CM | POA: Insufficient documentation

## 2011-09-24 DIAGNOSIS — G500 Trigeminal neuralgia: Secondary | ICD-10-CM | POA: Insufficient documentation

## 2011-09-24 DIAGNOSIS — Z0000 Encounter for general adult medical examination without abnormal findings: Secondary | ICD-10-CM | POA: Insufficient documentation

## 2011-09-29 ENCOUNTER — Encounter: Payer: Self-pay | Admitting: Oncology

## 2011-09-29 ENCOUNTER — Other Ambulatory Visit (HOSPITAL_BASED_OUTPATIENT_CLINIC_OR_DEPARTMENT_OTHER): Payer: Medicare Other | Admitting: Lab

## 2011-09-29 ENCOUNTER — Ambulatory Visit (HOSPITAL_BASED_OUTPATIENT_CLINIC_OR_DEPARTMENT_OTHER): Payer: Medicare Other | Admitting: Oncology

## 2011-09-29 ENCOUNTER — Ambulatory Visit: Payer: Medicare Other

## 2011-09-29 VITALS — BP 134/86 | HR 73 | Temp 96.6°F | Ht 65.5 in | Wt 148.9 lb

## 2011-09-29 DIAGNOSIS — D45 Polycythemia vera: Secondary | ICD-10-CM

## 2011-09-29 DIAGNOSIS — D751 Secondary polycythemia: Secondary | ICD-10-CM

## 2011-09-29 LAB — CBC WITH DIFFERENTIAL/PLATELET
BASO%: 0.8 % (ref 0.0–2.0)
Basophils Absolute: 0 10*3/uL (ref 0.0–0.1)
EOS%: 1.3 % (ref 0.0–7.0)
Eosinophils Absolute: 0.1 10*3/uL (ref 0.0–0.5)
HCT: 46.7 % — ABNORMAL HIGH (ref 34.8–46.6)
HGB: 16 g/dL — ABNORMAL HIGH (ref 11.6–15.9)
LYMPH%: 15.3 % (ref 14.0–49.7)
MCH: 31.1 pg (ref 25.1–34.0)
MCHC: 34.2 g/dL (ref 31.5–36.0)
MCV: 90.9 fL (ref 79.5–101.0)
MONO#: 0.4 10*3/uL (ref 0.1–0.9)
MONO%: 8.4 % (ref 0.0–14.0)
NEUT#: 3.6 10*3/uL (ref 1.5–6.5)
NEUT%: 74.2 % (ref 38.4–76.8)
Platelets: 229 10*3/uL (ref 145–400)
RBC: 5.13 10*6/uL (ref 3.70–5.45)
RDW: 12.7 % (ref 11.2–14.5)
WBC: 4.9 10*3/uL (ref 3.9–10.3)
lymph#: 0.8 10*3/uL — ABNORMAL LOW (ref 0.9–3.3)
nRBC: 0 % (ref 0–0)

## 2011-09-29 LAB — MORPHOLOGY
PLT EST: ADEQUATE
RBC Comments: NORMAL

## 2011-09-29 LAB — CHCC SMEAR

## 2011-09-29 NOTE — Patient Instructions (Addendum)
1.  Elevated Hemoglobin (polycythemia): - Possible etiologies:  Due to use of diuretic for blood pressure.  Need to rule out polycythemia vera (PV) which can predispose to blood clot.  2.  Work up:  JAK-2 mutation:  If positive, it is diagnostic for PV.    3.  Treatment: - start baby aspirin 38m by mouth daily regardless to decrease risk of clot. - If not PV, then just observation. - If PV, then [facility] medication that has been proven to decrease risk of clot.   4.  Follow up: - depending on result of work up.

## 2011-09-29 NOTE — Progress Notes (Signed)
[place]  [phone] Fax:([phone]) [place]    Referral MD:  Dr. [name]. [name], M.D.  Reason for Referral:  Polycythemia.     HPI:  [name] is a 71 year-old woman with history of HTN, on diuretic antihypertensive med.  She has had polycythemia going back as far as 2007 which is the oldest CBC available on EPIC.  On 03/17/2006, her Hgb was 16.3.  It has been elevated every time has had CBC drawn.  Given this chronic polycythemia, she was kindly referred to the [facility] for evaluation.   [name] presented to the clinic by herself today.  She has chronic intermittent headache.  This has significantly improved with minimization of coffee intake to only one cup daily.  She has painful trigeminal neuralgia in the right face with symptom control with carbamazepine.  She denies fever, chill, skin rash, erythromelalgia, blot clot.  Last year, she lost about 25-lb intentionally from cutting out wheat from her diet. She still has good appetite.   Patient denies fatigue, visual changes, confusion, drenching night sweats, palpable lymph node swelling, mucositis, odynophagia, dysphagia, nausea vomiting, jaundice, chest pain, palpitation, shortness of breath, dyspnea on exertion, productive cough, gum bleeding, epistaxis, hematemesis, hemoptysis, abdominal pain, abdominal swelling, early satiety, melena, hematochezia, hematuria, skin rash, spontaneous bleeding, joint swelling, joint pain, heat or cold intolerance, bowel bladder incontinence, back pain, focal motor weakness, paresthesia, depression, suicidal or homocidal ideation, feeling hopelessness.      Past Medical History  Diagnosis Date  . GERD (gastroesophageal reflux disease)   . Hyperlipidemia   . Hypertension     echo nl lv function  mild dilitation 2009  . Migraine     hypnic Aluna Whiston eval by Dr. Earley Favor in the past  . Abnormal blood finding    elevated Hg and hct    . 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 Hearing aid worn   . Polycythemia   . Trigeminal neuralgia pain   :    Past Surgical History  Procedure Date  . Doppler echocardiography 2009    nl lv function mild lv dilitation  . Abdominal hysterectomy 2002    tubal  . Cardiac catheterization 2000    chest pains neg  . Rt shoulder surgery   . Oophorectomy   . Appendectomy   :   CURRENT MEDS: Current Outpatient Prescriptions  Medication Sig Dispense Refill  . albuterol (PROVENTIL HFA;VENTOLIN HFA) 108 (90 BASE) MCG/ACT inhaler Inhale 2 puffs into the lungs every 6 (six) hours as needed.  3 Inhaler  0  . amLODipine (NORVASC) 5 MG tablet Take 1 tablet (5 mg total) by mouth daily.  90 tablet  3  . budesonide-formoterol (SYMBICORT) 80-4.5 MCG/ACT inhaler Inhale 2 puffs into the lungs 2 (two) times daily.  3 Inhaler  3  . butalbital-aspirin-caffeine-codeine (FIORINAL WITH CODEINE) 50-325-40-30 MG capsule Take 1 capsule by mouth every 4 (four) hours as needed for migraine. 1-2 by mouth every 4-6 hours as needed max of 6 tabs in 24 hours  30 capsule  1  . carbamazepine (CARBATROL) 300 MG 12 hr capsule Take 300 mg by mouth 3 (three) times daily.        . celecoxib (CELEBREX) 200 MG capsule Take 1 capsule (200 mg total) by mouth daily.  90 capsule  3  . doxycycline (VIBRAMYCIN) 100 MG capsule Take 1 capsule (100 mg total) by mouth 2 (  two) times daily.  20 capsule  0  . estrogens, conjugated, (PREMARIN) 0.3 MG tablet Take 1 tablet (0.3 mg total) by mouth daily. To twice a week  30 tablet  6  . sertraline (ZOLOFT) 100 MG tablet Take 1 tablet (100 mg total) by mouth daily.  90 tablet  3  . triamterene-hydrochlorothiazide (MAXZIDE-25) 37.5-25 MG per tablet Take 1 each (1 tablet total) by mouth daily.  90 tablet  0      Allergies  Allergen Reactions  . Sulfamethoxazole W-Trimethoprim     REACTION: unspecified  :  Family History  Problem Relation Age of Onset  . Ovarian  cancer Mother   . Stroke Mother   . Alcohol abuse Father   . Stroke Father   . Seizures Daughter   . Hypertension    . Diabetes Brother   . Cancer Paternal Aunt     leukemia, unknown type  :  History   Social History  . Marital Status: Married    Spouse Name: N/A    Number of Children: 2  . Years of Education: N/A   Occupational History  .      retired Forensic psychologist   Social History Main Topics  . Smoking status: Never Smoker   . Smokeless tobacco: Not on file  . Alcohol Use: Yes     twice a week  . Drug Use: No  . Sexually Active: Not on file   Other Topics Concern  . Not on file   Social History Narrative   [place] of 2-3 (god daughter)Pets 2 dogsNon smoker Child is a physicianG2P2  :  REVIEW OF SYSTEM:  The rest of the 14-point review of sytem was negative.   Exam: ECOG 0.   General: thin-appearing woman, in no acute distress.  Eyes:  no scleral icterus.  ENT:  There were no oropharyngeal lesions.  Neck was without thyromegaly.  Lymphatics:  Negative cervical, supraclavicular or axillary adenopathy.  Respiratory: lungs were clear bilaterally without wheezing or crackles.  Cardiovascular:  Regular rate and rhythm, S1/S2, without murmur, rub or gallop.  There was no pedal edema.  GI:  abdomen was soft, flat, nontender, nondistended, without organomegaly.  Muscoloskeletal:  no spinal tenderness of palpation of vertebral spine.  Skin exam was without echymosis, petichae.  Neuro exam was nonfocal.  Patient was able to get on and off exam table without assistance.  Gait was normal.  Patient was alerted and oriented.  Attention was good.   Language was appropriate.  Mood was normal without depression.  Speech was not pressured.  Thought content was not tangential.    LABS:  Lab Results  Component Value Date   WBC 4.9 09/29/2011   HGB 16.0* 09/29/2011   HCT 46.7* 09/29/2011   PLT 229 09/29/2011   GLUCOSE 94 09/12/2011   CHOL 267* 09/12/2011   TRIG 64.0 09/12/2011   HDL  111.20 09/12/2011   LDLDIRECT 148.4 09/12/2011   LDLCALC 92 02/25/2008   ALT 25 09/12/2011   AST 29 09/12/2011   NA 135 09/12/2011   K 3.9 09/12/2011   CL 98 09/12/2011   CREATININE 0.8 09/12/2011   BUN 17 09/12/2011   CO2 26 09/12/2011     Blood smear review:   I personally reviewed the patient's peripheral blood smear today.  There was isocytosis.  There was no peripheral blast.  There was no schistocytosis, spherocytosis, target cell, rouleaux formation, tear drop cell.  There was no giant platelets or platelet clumps.  ASSESSMENT AND PLAN:    1.  Elevated Hemoglobin (polycythemia): - Possible etiologies:  Due to use of diuretic for blood pressure.  Her Hgb today is slightly improved after she has been on lower dose of diuretic.  Clinically, she has no signs and symptoms of occult malignancy to result in exogenous source of erythropoietin.  She is up to date with age appropriate cancer screening. There is no indication for pan screening CT without symptoms.  Need to rule out polycythemia vera (PV) which can predispose to thrombosis.  -Work up:  JAK-2 mutation:  If positive, it is diagnostic for PV.   If negative, PV is ruled out.  -Treatment:  start baby aspirin 88m by mouth daily regardless to decrease risk of CVA.  Both her parents died from CVA.    If not PV, then just observation.  If PV, then [facility] which were proven to decrease risk of thrombosis.   2.  HTN:  Well control with Amlodipine, and Maxzide.   3.  Depression/anxiety:  Well controlled on Sertraline.   4.  Trigeminal neuralgia:  Well controlled on carbamazepine.  5.  Seasonal allergy:  On inhaler prn.   6.  Follow up: - depending on result of work up (JAK-2 mutation).    The length of time of the face-to-face encounter was 45 minutes. More than 50% of time was spent counseling and coordination of care.

## 2011-10-04 ENCOUNTER — Telehealth: Payer: Self-pay | Admitting: Oncology

## 2011-10-04 LAB — JAK2 GENOTYPR

## 2011-10-04 NOTE — Telephone Encounter (Signed)
I left a message on pt's cell phone relaying to her that JAK-2 mutation testing was negative.  In other words, she most likely does not have polycythemia vera.  I personally think that her slightly elevated Hgb is due to the use of diuretics since with lower dose of diuretic, her polycythemia slightly improved.    Recommendations: - Aspirin 81 mg PO daily to decrease risk of CAD, CVA given family history. - Follow up with PCP with semiannual CBC to ensure that her polycythemia vera does not significantly worsens.  - Return to the [place] prn (if Hgb continues to increase or she develops thrombosis).

## 2011-10-14 ENCOUNTER — Telehealth: Payer: Self-pay | Admitting: Internal Medicine

## 2011-10-14 MED ORDER — AMLODIPINE BESYLATE 5 MG PO TABS: 5.0000 mg | ORAL_TABLET | Freq: Every day | ORAL | Status: DC

## 2011-10-14 MED ORDER — CELECOXIB 200 MG PO CAPS: 200.0000 mg | ORAL_CAPSULE | Freq: Every day | ORAL | Status: DC

## 2011-10-14 MED ORDER — SERTRALINE HCL 100 MG PO TABS: 100.0000 mg | ORAL_TABLET | Freq: Every day | ORAL | Status: DC

## 2011-10-14 NOTE — Telephone Encounter (Signed)
Pt needs refills of sertraline (ZOLOFT) 100 MG tablet,  celecoxib (CELEBREX) 200 MG capsule , amLODipine (NORVASC) 5 MG tablet. Pt completely out of meds. Pls call in to local [facility] [phone] and also send in 90 day supply to [facility].

## 2011-10-14 NOTE — Telephone Encounter (Signed)
Rx sent to [facility].

## 2011-10-18 MED ORDER — CELECOXIB 200 MG PO CAPS: 200.0000 mg | ORAL_CAPSULE | Freq: Every day | ORAL | Status: DC

## 2011-10-18 MED ORDER — ESTROGENS CONJUGATED 0.3 MG PO TABS: 0.3000 mg | ORAL_TABLET | Freq: Every day | ORAL | Status: DC

## 2011-10-18 MED ORDER — SERTRALINE HCL 100 MG PO TABS: 100.0000 mg | ORAL_TABLET | Freq: Every day | ORAL | Status: DC

## 2011-10-18 MED ORDER — AMLODIPINE BESYLATE 5 MG PO TABS: 5.0000 mg | ORAL_TABLET | Freq: Every day | ORAL | Status: DC

## 2011-10-18 NOTE — Telephone Encounter (Signed)
Addended by: Colleen Can on: 10/18/2011 05:53 PM   Modules accepted: Orders

## 2011-10-18 NOTE — Telephone Encounter (Signed)
Rx sent to local pharmacy and pt states she is leaving going to [facility] and will pick rx up at a [facility] down there. Pt aware [facility].

## 2011-10-18 NOTE — Telephone Encounter (Signed)
Pt needs premarin #30 from [facility]. Pt needs all medications sent to [facility]

## 2011-10-18 NOTE — Telephone Encounter (Signed)
Premarin sent to [facility].

## 2011-11-16 ENCOUNTER — Other Ambulatory Visit: Payer: Self-pay | Admitting: Internal Medicine

## 2011-12-09 HISTORY — PX: CRANIOTOMY: SHX93

## 2011-12-14 ENCOUNTER — Telehealth: Payer: Self-pay | Admitting: Internal Medicine

## 2011-12-14 NOTE — Telephone Encounter (Signed)
Left message on voicemail for the pt to return my call.  Need additional information.

## 2011-12-14 NOTE — Telephone Encounter (Signed)
Pt  Recently had brain surgery and her insurance is not covering it because she did not get clearance from the doctor/ doctor did not suggest surgery. Pt requesting to get an approval from the doctor for insurance purposes. Please contact pt

## 2011-12-16 NOTE — Telephone Encounter (Signed)
OV

## 2011-12-16 NOTE — Telephone Encounter (Signed)
Uncertain what we are talking about. Get more info. Retro referrals are usually not done or accepted by insurance.

## 2011-12-20 NOTE — Telephone Encounter (Signed)
Pt coming 12/23/11.  30 minute appt.

## 2011-12-23 ENCOUNTER — Ambulatory Visit: Payer: Medicare Other | Admitting: Internal Medicine

## 2011-12-23 ENCOUNTER — Ambulatory Visit (INDEPENDENT_AMBULATORY_CARE_PROVIDER_SITE_OTHER): Payer: Medicare Other | Admitting: Internal Medicine

## 2011-12-23 ENCOUNTER — Encounter: Payer: Self-pay | Admitting: Internal Medicine

## 2011-12-23 VITALS — BP 134/84 | HR 88 | Temp 98.5°F | Wt 148.0 lb

## 2011-12-23 DIAGNOSIS — G500 Trigeminal neuralgia: Secondary | ICD-10-CM

## 2011-12-23 DIAGNOSIS — Z9889 Other specified postprocedural states: Secondary | ICD-10-CM

## 2011-12-23 DIAGNOSIS — Z4802 Encounter for removal of sutures: Secondary | ICD-10-CM

## 2011-12-23 NOTE — Progress Notes (Signed)
  Subjective:    Patient ID: [name], female    DOB: 12/26/1940, 71 y.o.   MRN: [id number]  HPI Pt comesin today for fu after having surgery decompression for Trigeminal neuralgia  At [facility] clinic on  July 19th . This occurred as after seeing Dr Jannifer Franklin not much to offer and was in ongoing difficult pain.  Was evaluated with MRI and surery done without complication by Dr Norm Parcel.   Since then pain was better for 2 weeks and now has sometimes am pain  Not as bac  intough with the surgical team and for now observicing and has med wean on hold.  Currently off cymbalta  On 25 topamax and down to 900 mg of tegretol. She ok to day takes hydromorphone at night for now.? Dose.  No new sx. No fever.   Staples need to be removed as 14 days out of craniotomy.  No wound pain of difficulty.  On restricted activity bending driving lifting.  Note from Dr Norm Parcel dated July 22 noted.  Review of Systems No systemic sx fever vision change adenopathy new neuro sx.  Past history family history social history reviewed in the electronic medical record.   Outpatient Encounter Prescriptions as of 12/23/2011  Medication Sig Dispense Refill  . albuterol (PROVENTIL HFA;VENTOLIN HFA) 108 (90 BASE) MCG/ACT inhaler Inhale 2 puffs into the lungs every 6 (six) hours as needed.  3 Inhaler  0  . amLODipine (NORVASC) 5 MG tablet Take 1 tablet (5 mg total) by mouth daily.  30 tablet  0  . budesonide-formoterol (SYMBICORT) 80-4.5 MCG/ACT inhaler Inhale 2 puffs into the lungs 2 (two) times daily.  3 Inhaler  3  . butalbital-aspirin-caffeine-codeine (FIORINAL WITH CODEINE) 50-325-40-30 MG capsule Take 1 capsule by mouth every 4 (four) hours as needed for migraine. 1-2 by mouth every 4-6 hours as needed max of 6 tabs in 24 hours  30 capsule  1  . carbamazepine (CARBATROL) 300 MG 12 hr capsule Take 300 mg by mouth 3 (three) times daily.        . sertraline (ZOLOFT) 100 MG tablet TAKE 1 TABLET BY MOUTH EVERY DAY  30 tablet  0  .  triamterene-hydrochlorothiazide (MAXZIDE-25) 37.5-25 MG per tablet Take 1 each (1 tablet total) by mouth daily.  90 tablet  0  . celecoxib (CELEBREX) 200 MG capsule Take 1 capsule (200 mg total) by mouth daily.  30 capsule  0  . estrogens, conjugated, (PREMARIN) 0.3 MG tablet Take 1 tablet (0.3 mg total) by mouth daily.  30 tablet  6       Objective:   Physical Exam BP 134/84  Pulse 88  Temp 98.5 F (36.9 C) (Oral)  Wt 148 lb (67.132 kg)  SpO2 96%  WDWN in nad looks well Oriented x 3 and no noted deficits in memory, attention, and speech. Right craniotomy wound healing  24 staples removed without difficulty  Some redness without tenderness at lower area staples no warmth or swelling. No adenopathy of neck.     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   Expectant management. Having a little recurrent pain but not as bad and not continuous.  Contact us if needed   Wound clean no infection  . Gentle cleaning  As advised by surgeon.   Total visit 55mns > 50% spent counseling and coordinating care  Care team updated

## 2011-12-24 ENCOUNTER — Encounter: Payer: Self-pay | Admitting: Internal Medicine

## 2011-12-24 DIAGNOSIS — Z9889 Other specified postprocedural states: Secondary | ICD-10-CM | POA: Insufficient documentation

## 2011-12-24 DIAGNOSIS — Z4802 Encounter for removal of sutures: Secondary | ICD-10-CM | POA: Insufficient documentation

## 2011-12-24 NOTE — Patient Instructions (Signed)
Local care and fu as directed

## 2012-02-02 ENCOUNTER — Ambulatory Visit (INDEPENDENT_AMBULATORY_CARE_PROVIDER_SITE_OTHER): Payer: Medicare Other

## 2012-02-02 DIAGNOSIS — Z23 Encounter for immunization: Secondary | ICD-10-CM

## 2012-02-24 ENCOUNTER — Telehealth: Payer: Self-pay | Admitting: Internal Medicine

## 2012-02-24 NOTE — Telephone Encounter (Signed)
Ok to do referral?  

## 2012-02-24 NOTE — Telephone Encounter (Signed)
Patient called stating that she need a referral so that she may follow up from her  Brain surgery to the [facility] in [place] ph. Number [phone] and they ask that you include the patient id number [phone] as her insurance is requiring she be referred. Please advise and assist. Patient is scheduled for Oct. 10,2013 and Oct. 11,2013.

## 2012-02-24 NOTE — Telephone Encounter (Signed)
The doctor is Dr. Levonne Lapping in the department of Neurology and the surgeon's name is Dr. Norm Parcel.

## 2012-02-26 NOTE — Telephone Encounter (Signed)
Yes approve referral for post op for facial nerve neuralgia

## 2012-02-27 ENCOUNTER — Other Ambulatory Visit: Payer: Self-pay | Admitting: Family Medicine

## 2012-02-27 DIAGNOSIS — Z9889 Other specified postprocedural states: Secondary | ICD-10-CM

## 2012-02-27 NOTE — Telephone Encounter (Signed)
Order placed urgent in the computer.

## 2012-02-28 ENCOUNTER — Telehealth: Payer: Self-pay | Admitting: Family Medicine

## 2012-02-28 NOTE — Telephone Encounter (Signed)
Left message on voicemail notifying the pt that referral has been made.

## 2012-02-28 NOTE — Telephone Encounter (Signed)
Referral was faxed yesterday

## 2012-02-28 NOTE — Telephone Encounter (Signed)
Patient calling to question if referral has been made for her appt at the [facility].  Please call pt and advise.

## 2012-03-06 ENCOUNTER — Other Ambulatory Visit: Payer: Self-pay | Admitting: Internal Medicine

## 2012-03-06 ENCOUNTER — Other Ambulatory Visit: Payer: Self-pay | Admitting: Family Medicine

## 2012-03-06 MED ORDER — TRIAMTERENE-HCTZ 37.5-25 MG PO TABS: 1.0000 | ORAL_TABLET | Freq: Every day | ORAL | Status: DC

## 2012-03-09 ENCOUNTER — Other Ambulatory Visit: Payer: Self-pay | Admitting: Family Medicine

## 2012-03-09 ENCOUNTER — Other Ambulatory Visit: Payer: Self-pay | Admitting: Internal Medicine

## 2012-03-09 MED ORDER — CARBAMAZEPINE ER 300 MG PO CP12: 300.0000 mg | ORAL_CAPSULE | Freq: Three times a day (TID) | ORAL | Status: DC

## 2012-03-20 ENCOUNTER — Encounter: Payer: Self-pay | Admitting: Internal Medicine

## 2012-03-20 ENCOUNTER — Ambulatory Visit (INDEPENDENT_AMBULATORY_CARE_PROVIDER_SITE_OTHER): Payer: Medicare Other | Admitting: Internal Medicine

## 2012-03-20 VITALS — BP 134/100 | HR 83 | Temp 98.5°F | Wt 151.0 lb

## 2012-03-20 DIAGNOSIS — Z79899 Other long term (current) drug therapy: Secondary | ICD-10-CM

## 2012-03-20 DIAGNOSIS — I10 Essential (primary) hypertension: Secondary | ICD-10-CM

## 2012-03-20 DIAGNOSIS — D751 Secondary polycythemia: Secondary | ICD-10-CM

## 2012-03-20 DIAGNOSIS — G500 Trigeminal neuralgia: Secondary | ICD-10-CM

## 2012-03-20 DIAGNOSIS — D45 Polycythemia vera: Secondary | ICD-10-CM

## 2012-03-20 LAB — CBC WITH DIFFERENTIAL/PLATELET
Basophils Absolute: 0 10*3/uL (ref 0.0–0.1)
Basophils Relative: 0.8 % (ref 0.0–3.0)
Eosinophils Absolute: 0.2 10*3/uL (ref 0.0–0.7)
Eosinophils Relative: 2.9 % (ref 0.0–5.0)
HCT: 47.2 % — ABNORMAL HIGH (ref 36.0–46.0)
Hemoglobin: 15.7 g/dL — ABNORMAL HIGH (ref 12.0–15.0)
Lymphocytes Relative: 22.2 % (ref 12.0–46.0)
Lymphs Abs: 1.2 10*3/uL (ref 0.7–4.0)
MCHC: 33.2 g/dL (ref 30.0–36.0)
MCV: 91.8 fl (ref 78.0–100.0)
Monocytes Absolute: 0.4 10*3/uL (ref 0.1–1.0)
Monocytes Relative: 7.5 % (ref 3.0–12.0)
Neutro Abs: 3.5 10*3/uL (ref 1.4–7.7)
Neutrophils Relative %: 66.6 % (ref 43.0–77.0)
Platelets: 225 10*3/uL (ref 150.0–400.0)
RBC: 5.14 Mil/uL — ABNORMAL HIGH (ref 3.87–5.11)
RDW: 13 % (ref 11.5–14.6)
WBC: 5.2 10*3/uL (ref 4.5–10.5)

## 2012-03-20 LAB — HEPATIC FUNCTION PANEL
ALT: 24 U/L (ref 0–35)
AST: 24 U/L (ref 0–37)
Albumin: 3.9 g/dL (ref 3.5–5.2)
Alkaline Phosphatase: 87 U/L (ref 39–117)
Bilirubin, Direct: 0.1 mg/dL (ref 0.0–0.3)
Total Bilirubin: 0.6 mg/dL (ref 0.3–1.2)
Total Protein: 6.6 g/dL (ref 6.0–8.3)

## 2012-03-20 MED ORDER — CARBAMAZEPINE 100 MG PO CHEW: 100.0000 mg | CHEWABLE_TABLET | Freq: Every evening | ORAL | Status: DC

## 2012-03-20 MED ORDER — CARBAMAZEPINE 200 MG PO TABS: 200.0000 mg | ORAL_TABLET | ORAL | Status: DC

## 2012-03-20 NOTE — Progress Notes (Signed)
Chief Complaint  Patient presents with  . Follow-up    BP check    HPI: Patient comes in today for follow up of  multiple medical problems.  Head pain : had fu at [facility]  Better but pain not totally resolved however much better thatn pre surgery :more controlled  Taking  carbatraol 300 per day trying to wean some but needs 100 mg  Units to  Adjust. To try 200 am and 100 pm to last through the day,. No new se of meds . HT:Bp usually runs 120/80  But up today uncertain  stressfull recently.  Checking readings  About  continuing medication  MOOD: stable better with pain control .  Traveling to [place] soon.  ROS: See pertinent positives and negatives per HPI. No cp  New sob syncope now bleeding new cough recurrence of has Past Medical History  Diagnosis Date  . GERD (gastroesophageal reflux disease)   . Hyperlipidemia   . Hypertension     echo nl lv function  mild dilitation 2009  . Migraine     hypnic HA eval by Dr. Earley Favor in the past  . Abnormal blood finding     elevated Hg and hct    . 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 Hearing aid worn   . Polycythemia   . Trigeminal neuralgia pain   . Closed head injury 02/01/2011    from syncope and had scalp laceration  neg ct .    Marland Kitchen Syncope 02/01/2011    In shower on vacation  sustained head laceration  8 sutures Had ed visit neg head ct labs and x ray   . 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 Medication side effect 09/02/2010    Poss muscle se of 10 crestor     Family History  Problem Relation Age of Onset  . Ovarian cancer Mother   . Stroke Mother   . Alcohol abuse Father   . Stroke Father   . Seizures Daughter   . Hypertension    . Diabetes Brother   . Cancer Paternal Aunt     leukemia, unknown type    History   Social History  . Marital Status: Married    Spouse Name: N/A    Number of Children: 2  . Years of Education: N/A   Occupational History    .      retired Forensic psychologist   Social History Main Topics  . Smoking status: Never Smoker   . Smokeless tobacco: None  . Alcohol Use: Yes     twice a week  . Drug Use: No  . Sexually Active: None   Other Topics Concern  . None   Social History Narrative   MarriedHH of 2-3 (god daughter)Pets 2 dogsNon smoker Child is a physicianG2P2    Current outpatient prescriptions:albuterol (PROVENTIL HFA;VENTOLIN HFA) 108 (90 BASE) MCG/ACT inhaler, Inhale 2 puffs into the lungs every 6 (six) hours as needed., Disp: 3 Inhaler, Rfl: 0;  amLODipine (NORVASC) 5 MG tablet, Take 1 tablet (5 mg total) by mouth daily., Disp: 30 tablet, Rfl: 0;  budesonide-formoterol (SYMBICORT) 80-4.5 MCG/ACT inhaler, Inhale 2 puffs into the lungs 2 (two) times daily., Disp: 3 Inhaler, Rfl: 3 butalbital-aspirin-caffeine-codeine (FIORINAL WITH CODEINE) 50-325-40-30 MG capsule, Take 1 capsule by mouth every 4 (four) hours as needed for migraine. 1-2 by mouth every 4-6 hours as  needed max of 6 tabs in 24 hours, Disp: 30 capsule, Rfl: 1;  triamterene-hydrochlorothiazide (MAXZIDE-25) 37.5-25 MG per tablet, Take 1 each (1 tablet total) by mouth daily., Disp: 90 tablet, Rfl: 0 carbamazepine (TEGRETOL) 100 MG chewable tablet, Chew 1 tablet (100 mg total) by mouth every evening. Or as directed, Disp: 30 tablet, Rfl: 3;  carbamazepine (TEGRETOL) 200 MG tablet, Take 1 tablet (200 mg total) by mouth every morning. Or as directd, Disp: 30 tablet, Rfl: 3  EXAM: BP 134/100  Pulse 83  Temp 98.5 F (36.9 C) (Oral)  Wt 151 lb (68.493 kg)  SpO2 98% Repeat BP  142/82 GENERAL: vitals reviewed and listed above, alert, oriented, appears well hydrated and in no acute distress  HEENT: atraumatic, conjunttiva clear, no obvious abnormalities on inspection of external nose and ears OP : no lesion edema or exudate   NECK: no obvious masses on inspection palpation   LUNGS: clear to auscultation bilaterally, no wheezes, rales or rhonchi, good  air movement  CV: HRRR,   No g or m  extr:no clubbing cyanosis or slight t puffiness ot feet .  nl cap refill   MS: moves all extremities without noticeable focal  abnormality  PSYCH: pleasant and cooperative, no obvious depression or anxiety Lab Results  Component Value Date   WBC 5.2 03/20/2012   HGB 15.7* 03/20/2012   HCT 47.2* 03/20/2012   PLT 225.0 03/20/2012   GLUCOSE 94 09/12/2011   CHOL 267* 09/12/2011   TRIG 64.0 09/12/2011   HDL 111.20 09/12/2011   LDLDIRECT 148.4 09/12/2011   LDLCALC 92 02/25/2008   ALT 24 03/20/2012   AST 24 03/20/2012   NA 135 09/12/2011   K 3.9 09/12/2011   CL 98 09/12/2011   CREATININE 0.8 09/12/2011   BUN 17 09/12/2011   CO2 26 09/12/2011   TSH 1.00 09/12/2011    ASSESSMENT AND PLAN:  Discussed the following assessment and plan:  1. Trigeminal neuralgia  carbamazepine (TEGRETOL) 200 MG tablet, carbamazepine (TEGRETOL) 100 MG chewable tablet, CBC with Differential, Hepatic function panel   sp surgery better still with some pain  2. HYPERTENSION  carbamazepine (TEGRETOL) 200 MG tablet, carbamazepine (TEGRETOL) 100 MG chewable tablet, CBC with Differential, Hepatic function panel   up today monitor  3. Medication management  carbamazepine (TEGRETOL) 200 MG tablet, carbamazepine (TEGRETOL) 100 MG chewable tablet, CBC with Differential, Hepatic function panel   need lab today  4. Polycythemia elevated H/H     stable    better still wth some residual  Bp up today but will cofirm nl at home   Last labs good needs monitoring labs today and disc about med weaning   -Patient advised to return or notify a doctor immediately if symptoms worsen or persist or new concerns arise.  Patient Instructions  Ok to change to tablets  Of carbamazepame.  And wean and adjust as discussed.  Call for refills.   Monitor Bp readings   When elevated.   ROV in 6 months  Labs in meantime   [employer]

## 2012-03-20 NOTE — Patient Instructions (Signed)
Ok to change to tablets  Of carbamazepame.  And wean and adjust as discussed.  Call for refills.   Monitor Bp readings   When elevated.

## 2012-03-24 ENCOUNTER — Encounter: Payer: Self-pay | Admitting: Internal Medicine

## 2012-03-24 DIAGNOSIS — Z79899 Other long term (current) drug therapy: Secondary | ICD-10-CM | POA: Insufficient documentation

## 2012-04-13 NOTE — Telephone Encounter (Signed)
Opened in error

## 2012-05-09 ENCOUNTER — Other Ambulatory Visit: Payer: Self-pay | Admitting: Family Medicine

## 2012-05-09 MED ORDER — TRIAMTERENE-HCTZ 37.5-25 MG PO TABS: 1.0000 | ORAL_TABLET | Freq: Every day | ORAL | Status: DC

## 2012-08-14 ENCOUNTER — Encounter: Payer: Self-pay | Admitting: Internal Medicine

## 2012-08-14 ENCOUNTER — Ambulatory Visit (INDEPENDENT_AMBULATORY_CARE_PROVIDER_SITE_OTHER): Payer: Medicare Other | Admitting: Internal Medicine

## 2012-08-14 VITALS — BP 156/100 | HR 69 | Temp 97.6°F | Wt 146.0 lb

## 2012-08-14 DIAGNOSIS — M79604 Pain in right leg: Secondary | ICD-10-CM

## 2012-08-14 DIAGNOSIS — M79609 Pain in unspecified limb: Secondary | ICD-10-CM

## 2012-08-14 DIAGNOSIS — G479 Sleep disorder, unspecified: Secondary | ICD-10-CM

## 2012-08-14 DIAGNOSIS — M5431 Sciatica, right side: Secondary | ICD-10-CM

## 2012-08-14 DIAGNOSIS — M5430 Sciatica, unspecified side: Secondary | ICD-10-CM

## 2012-08-14 MED ORDER — TIZANIDINE HCL 2 MG PO CAPS: ORAL_CAPSULE | ORAL | Status: DC

## 2012-08-14 NOTE — Patient Instructions (Signed)
Get the plain x ray  .   As we discussed.  Would then plan  Prednisone .  To decrease   Nerve pain. And if not better .  Consider other  Evaluation  .

## 2012-08-14 NOTE — Progress Notes (Signed)
Chief Complaint  Patient presents with  . Leg Pain    Started some months ago.  . Hip Pain    sleep issues also   . Back Pain    HPI: Patient comes in today for SDA for  new problem evaluation. Hanging curtains and stepped down from tall chair   At daughters a few months ago and had pain right buttocks radiating down back of leg to calf   And now months later not getting better     Since onset    Off and on hurts more at times and hard  To walk.  Hard to bear weight to climb stairs  . Hard to make it weight.  No  specific weakness .   Tried all of the otc inc nsaids  And sued up her  codiene supply  Comes and  goes getting bad sitting  and stairs the worse.  No previous problem   Sleep :recently a problem  Wakens at 2 am mind a problem alos pain. And issues . No pm caffiene no etoh.  Cant take Lorrin Mais causes hallucinations  ROS: See pertinent positives and negatives per HPI. No falling  New neuro sx .  No numbness  Past Medical History  Diagnosis Date  . GERD (gastroesophageal reflux disease)   . Hyperlipidemia   . Hypertension     echo nl lv function  mild dilitation 2009  . Migraine     hypnic HA eval by Dr. Earley Favor in the past  . Abnormal blood finding     elevated Hg and hct    . 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 Hearing aid worn   . Polycythemia   . Trigeminal neuralgia pain   . Closed head injury 02/01/2011    from syncope and had scalp laceration  neg ct .    Marland Kitchen Syncope 02/01/2011    In shower on vacation  sustained head laceration  8 sutures Had ed visit neg head ct labs and x ray   . 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 Medication side effect 09/02/2010    Poss muscle se of 10 crestor     Family History  Problem Relation Age of Onset  . Ovarian cancer Mother   . Stroke Mother   . Alcohol abuse Father   . Stroke Father   . Seizures Daughter   . Hypertension    . Diabetes Brother   .  Cancer Paternal Aunt     leukemia, unknown type    History   Social History  . Marital Status: Married    Spouse Name: N/A    Number of Children: 2  . Years of Education: N/A   Occupational History  .      retired Forensic psychologist   Social History Main Topics  . Smoking status: Never Smoker   . Smokeless tobacco: None  . Alcohol Use: Yes     Comment: twice a week  . Drug Use: No  . Sexually Active: None   Other Topics Concern  . None   Social History Narrative   Married   HH of 2-3 (god daughter)   Pets 2 dogs   Non smoker    Child is a physician   G2P2                Outpatient Encounter Prescriptions as of  08/14/2012  Medication Sig Dispense Refill  . albuterol (PROVENTIL HFA;VENTOLIN HFA) 108 (90 BASE) MCG/ACT inhaler Inhale 2 puffs into the lungs every 6 (six) hours as needed.  3 Inhaler  0  . amLODipine (NORVASC) 5 MG tablet Take 1 tablet (5 mg total) by mouth daily.  30 tablet  0  . budesonide-formoterol (SYMBICORT) 80-4.5 MCG/ACT inhaler Inhale 2 puffs into the lungs 2 (two) times daily.  3 Inhaler  3  . butalbital-aspirin-caffeine-codeine (FIORINAL WITH CODEINE) 50-325-40-30 MG capsule Take 1 capsule by mouth every 4 (four) hours as needed for migraine. 1-2 by mouth every 4-6 hours as needed max of 6 tabs in 24 hours  30 capsule  1  . carbamazepine (TEGRETOL) 100 MG chewable tablet Chew 100 mg by mouth 2 (two) times daily. Or as directed      . carbamazepine (TEGRETOL) 200 MG tablet Take 200 mg by mouth 2 (two) times daily. Or as directd      . triamterene-hydrochlorothiazide (MAXZIDE-25) 37.5-25 MG per tablet Take 1 each (1 tablet total) by mouth daily.  90 tablet  0  . [DISCONTINUED] carbamazepine (TEGRETOL) 100 MG chewable tablet Chew 1 tablet (100 mg total) by mouth every evening. Or as directed  30 tablet  3  . [DISCONTINUED] carbamazepine (TEGRETOL) 200 MG tablet Take 1 tablet (200 mg total) by mouth every morning. Or as directd  30 tablet  3  .  tizanidine (ZANAFLEX) 2 MG capsule Take 1-2 hs prn pain and sleep  30 capsule  1   No facility-administered encounter medications on file as of 08/14/2012.    EXAM:  BP 156/100  Pulse 69  Temp(Src) 97.6 F (36.4 C) (Oral)  Wt 146 lb (66.225 kg)  BMI 23.92 kg/m2  SpO2 97%  Body mass index is 23.92 kg/(m^2).  GENERAL: vitals reviewed and listed above, alert, oriented, appears well hydrated and in no acute distress Antalgic  Alert cognitively intact  HEENT: atraumatic, conjunctiva  clear, no obvious abnormalities on inspection of external nose and ears  NECK: no obvious masses on inspection palpation  CV: HRRR, no clubbing cyanosis or  peripheral edema nl cap refill  MS: moves all extremities without noticeable focal  Abnormality  No bony back tenderness  Points to buttocks area. Toe heel elevation is normal  Hobbles with gait.  noredness  actue swelling with legs  And no going pain doesn't hurt more to stand on one leg. PSYCH: pleasant and cooperative, no obvious depression or anxiety  ASSESSMENT AND PLAN:  Discussed the following assessment and plan: Try tiazidine  For back and sleep  Consider trazadone if needed other  pred bust if x ray ok  And consider pt sports med  If  persistent or progressive  Leg pain, posterior, right - no weakness  problematic  sciatic discogenic vs pyriformis vs other  - Plan: DG Lumbar Spine Complete  Sciatic neuritis, right - Plan: DG Lumbar Spine Complete  Sleep disturbance, unspecified - part pain part family stress issues  cant take ambien hallucinations  try xanaflex and consider trazadone for now Sleep hygiene  Reviewed seemingly ok  -Patient advised to return or notify health care team  if symptoms worsen or persist or new concerns arise.  Patient Instructions  Get the plain x ray  .   As we discussed.  Would then plan  Prednisone .  To decrease   Nerve pain. And if not better .  Consider other  Evaluation  .    [name].  Karissa Meenan M.D.  Pt  hasn't received x ray yet  Will close document and await  Before sending in steroid.

## 2012-08-16 DIAGNOSIS — M5430 Sciatica, unspecified side: Secondary | ICD-10-CM | POA: Insufficient documentation

## 2012-08-16 DIAGNOSIS — G479 Sleep disorder, unspecified: Secondary | ICD-10-CM | POA: Insufficient documentation

## 2012-08-16 DIAGNOSIS — M79606 Pain in leg, unspecified: Secondary | ICD-10-CM | POA: Insufficient documentation

## 2012-08-24 ENCOUNTER — Other Ambulatory Visit: Payer: Self-pay | Admitting: Family Medicine

## 2012-08-24 ENCOUNTER — Ambulatory Visit (INDEPENDENT_AMBULATORY_CARE_PROVIDER_SITE_OTHER)
Admission: RE | Admit: 2012-08-24 | Discharge: 2012-08-24 | Disposition: A | Discharge status: Home / Self Care | Payer: Medicare Other | Source: Ambulatory Visit | Attending: Internal Medicine | Admitting: Internal Medicine

## 2012-08-24 DIAGNOSIS — M5430 Sciatica, unspecified side: Secondary | ICD-10-CM

## 2012-08-24 DIAGNOSIS — M5431 Sciatica, right side: Secondary | ICD-10-CM

## 2012-08-24 DIAGNOSIS — M79604 Pain in right leg: Secondary | ICD-10-CM

## 2012-08-24 DIAGNOSIS — M79609 Pain in unspecified limb: Secondary | ICD-10-CM

## 2012-08-24 MED ORDER — PREDNISONE 20 MG PO TABS: ORAL_TABLET | ORAL | Status: DC

## 2012-09-10 ENCOUNTER — Encounter: Payer: Self-pay | Admitting: Internal Medicine

## 2012-09-10 ENCOUNTER — Ambulatory Visit (INDEPENDENT_AMBULATORY_CARE_PROVIDER_SITE_OTHER): Payer: Medicare Other | Admitting: Internal Medicine

## 2012-09-10 VITALS — BP 156/86 | HR 73 | Temp 98.0°F | Wt 147.0 lb

## 2012-09-10 DIAGNOSIS — M79609 Pain in unspecified limb: Secondary | ICD-10-CM

## 2012-09-10 DIAGNOSIS — E785 Hyperlipidemia, unspecified: Secondary | ICD-10-CM

## 2012-09-10 DIAGNOSIS — M79606 Pain in leg, unspecified: Secondary | ICD-10-CM

## 2012-09-10 DIAGNOSIS — Z79899 Other long term (current) drug therapy: Secondary | ICD-10-CM

## 2012-09-10 DIAGNOSIS — I10 Essential (primary) hypertension: Secondary | ICD-10-CM

## 2012-09-10 MED ORDER — TRIAMTERENE-HCTZ 37.5-25 MG PO TABS: 1.0000 | ORAL_TABLET | Freq: Every day | ORAL | Status: DC

## 2012-09-10 MED ORDER — AMLODIPINE BESYLATE 5 MG PO TABS: 5.0000 mg | ORAL_TABLET | Freq: Every day | ORAL | Status: DC

## 2012-09-10 NOTE — Patient Instructions (Signed)
Ok to do the conservative course.   For now and if  persistent or progressive or weakness fever etc then we need to get  Specialty opinion.     Check your blood pressure readings at rest x 3  Per week at least to [facility] sure below 150 range .  Change the April appt to about 4-5 weeks from now for FU BP and back Labs pre visit  Please bring in your monitor to the visit   Will refill medication today  Without change otherwise .

## 2012-09-10 NOTE — Progress Notes (Signed)
Chief Complaint  Patient presents with  . Follow-up    Needs a refill of triamterene/hctz and amolodipine    HPI: Patient comes in  today for follow up of  multiple medical problems.  Back pain with radiation :prednsonme hleped  Tremendously  And doing better     Off since 5 days   coming back after  Going off but   Pain improved   About 3/10   Comes and goes.   noweakness Silver sneakers    Made it worse.   Needs refill bp meds  Very stressful life events but doing ok  No se of meds noted per pt  ? Readings at home has been ok in the past  ROS: See pertinent positives and negatives per HPI.  Past Medical History  Diagnosis Date  . GERD (gastroesophageal reflux disease)   . Hyperlipidemia   . Hypertension     echo nl lv function  mild dilitation 2009  . Migraine     hypnic HA eval by Dr. Earley Favor in the past  . Abnormal blood finding     elevated Hg and hct    . 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 Hearing aid worn   . Polycythemia   . Trigeminal neuralgia pain   . Closed head injury 02/01/2011    from syncope and had scalp laceration  neg ct .    Marland Kitchen Syncope 02/01/2011    In shower on vacation  sustained head laceration  8 sutures Had ed visit neg head ct labs and x ray   . 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 Medication side effect 09/02/2010    Poss muscle se of 10 crestor     Family History  Problem Relation Age of Onset  . Ovarian cancer Mother   . Stroke Mother   . Alcohol abuse Father   . Stroke Father   . Seizures Daughter   . Hypertension    . Diabetes Brother   . Cancer Paternal Aunt     leukemia, unknown type    History   Social History  . Marital Status: Married    Spouse Name: N/A    Number of Children: 2  . Years of Education: N/A   Occupational History  .      retired Forensic psychologist   Social History Main Topics  . Smoking status: Never Smoker   . Smokeless tobacco:  None  . Alcohol Use: Yes     Comment: twice a week  . Drug Use: No  . Sexually Active: None   Other Topics Concern  . None   Social History Narrative   Married   HH of 2-3 (god daughter)   Pets 2 dogs   Non smoker    Child is a physician   G2P2                Outpatient Encounter Prescriptions as of 09/10/2012  Medication Sig Dispense Refill  . albuterol (PROVENTIL HFA;VENTOLIN HFA) 108 (90 BASE) MCG/ACT inhaler Inhale 2 puffs into the lungs every 6 (six) hours as needed.  3 Inhaler  0  . amLODipine (NORVASC) 5 MG tablet Take 1 tablet (5 mg total) by mouth daily.  90 tablet  1  . budesonide-formoterol (SYMBICORT) 80-4.5 MCG/ACT inhaler Inhale 2 puffs into the lungs 2 (two) times daily.  3 Inhaler  3  .  butalbital-aspirin-caffeine-codeine (FIORINAL WITH CODEINE) 50-325-40-30 MG capsule Take 1 capsule by mouth every 4 (four) hours as needed for migraine. 1-2 by mouth every 4-6 hours as needed max of 6 tabs in 24 hours  30 capsule  1  . carbamazepine (TEGRETOL) 100 MG chewable tablet Chew 300 mg by mouth 2 (two) times daily. Or as directed      . triamterene-hydrochlorothiazide (MAXZIDE-25) 37.5-25 MG per tablet Take 1 each (1 tablet total) by mouth daily.  90 tablet  1  . [DISCONTINUED] amLODipine (NORVASC) 5 MG tablet Take 1 tablet (5 mg total) by mouth daily.  30 tablet  0  . [DISCONTINUED] triamterene-hydrochlorothiazide (MAXZIDE-25) 37.5-25 MG per tablet Take 1 each (1 tablet total) by mouth daily.  90 tablet  0  . [DISCONTINUED] carbamazepine (TEGRETOL) 200 MG tablet Take 200 mg by mouth 2 (two) times daily. Or as directd      . [DISCONTINUED] predniSONE (DELTASONE) 20 MG tablet Pills per day 3,3,3,,2,2,2, 1,1,1,1/2,1/2,1/2  20 tablet  0  . [DISCONTINUED] tizanidine (ZANAFLEX) 2 MG capsule Take 1-2 hs prn pain and sleep  30 capsule  1   No facility-administered encounter medications on file as of 09/10/2012.    EXAM:  BP 156/86  Pulse 73  Temp(Src) 98 F (36.7 C) (Oral)   Wt 147 lb (66.679 kg)  BMI 24.08 kg/m2  SpO2 96%  Body mass index is 24.08 kg/(m^2).  GENERAL: vitals reviewed and listed above, alert, oriented, appears well hydrated and in no acute distress  HEENT: atraumatic, conjunctiva  clear, no obvious abnormalities on inspection of external nose and ears OP : no lesion edema or exudate   NECK: no obvious masses on inspection palpation   LUNGS: clear to auscultation bilaterally, no wheezes, rales or rhonchi, good air movement  CV: HRRR, no clubbing cyanosis or  peripheral edema nl cap refill   MS: moves all extremities without noticeable focal  abnormality  PSYCH: pleasant and cooperative, no obvious depression or anxiety Lab Results  Component Value Date   WBC 5.2 03/20/2012   HGB 15.7* 03/20/2012   HCT 47.2* 03/20/2012   PLT 225.0 03/20/2012   GLUCOSE 94 09/12/2011   CHOL 267* 09/12/2011   TRIG 64.0 09/12/2011   HDL 111.20 09/12/2011   LDLDIRECT 148.4 09/12/2011   LDLCALC 92 02/25/2008   ALT 24 03/20/2012   AST 24 03/20/2012   NA 135 09/12/2011   K 3.9 09/12/2011   CL 98 09/12/2011   CREATININE 0.8 09/12/2011   BUN 17 09/12/2011   CO2 26 09/12/2011   TSH 1.00 09/12/2011    ASSESSMENT AND PLAN:  Discussed the following assessment and plan:  Leg pain, posterior, unspecified laterality - Plan: triamterene-hydrochlorothiazide (MAXZIDE-25) 37.5-25 MG per tablet, amLODipine (NORVASC) 5 MG tablet, Basic metabolic panel, CBC with Differential, Lipid panel, TSH, Hepatic function panel  HYPERTENSION - Plan: triamterene-hydrochlorothiazide (MAXZIDE-25) 37.5-25 MG per tablet, amLODipine (NORVASC) 5 MG tablet, Basic metabolic panel, CBC with Differential, Lipid panel, TSH, Hepatic function panel  HYPERLIPIDEMIA - Plan: triamterene-hydrochlorothiazide (MAXZIDE-25) 37.5-25 MG per tablet, amLODipine (NORVASC) 5 MG tablet, Basic metabolic panel, CBC with Differential, Lipid panel, TSH, Hepatic function panel  Medication management - Plan:  triamterene-hydrochlorothiazide (MAXZIDE-25) 37.5-25 MG per tablet, amLODipine (NORVASC) 5 MG tablet, Basic metabolic panel, CBC with Differential, Lipid panel, TSH, Hepatic function panel  -Patient advised to return or notify health care team  if symptoms worsen or persist or new concerns arise.  Patient Instructions  Ok to do the conservative course.  For now and if  persistent or progressive or weakness fever etc then we need to get  Specialty opinion.     Check your blood pressure readings at rest x 3  Per week at least to [facility] sure below 150 range .  Change the April appt to about 4-5 weeks from now for FU BP and back Labs pre visit  Please bring in your monitor to the visit   Will refill medication today  Without change otherwise .     [name]. [name] M.D.  Reviewed record for lab monitoring   Placing future orders  For next visit  medication monitoring etc

## 2012-09-16 ENCOUNTER — Encounter: Payer: Self-pay | Admitting: Internal Medicine

## 2012-09-18 ENCOUNTER — Ambulatory Visit: Payer: Medicare Other | Admitting: Internal Medicine

## 2012-09-28 ENCOUNTER — Telehealth: Payer: Self-pay | Admitting: Internal Medicine

## 2012-09-28 NOTE — Telephone Encounter (Signed)
Can refill x 1  Disp 30

## 2012-09-28 NOTE — Telephone Encounter (Signed)
Patient called stating that she need a refill of her butalbital-aspirin-caffeine-codeine (FIORINAL WITH CODEINE) 50-325-40-30 MG capsule Take 1 capsule by mouth every 4 (four) hours as needed for migraine. 1-2 by mouth every 4-6 hours as needed max of 6 tabs in 24 hours sent to [place] [address]. Please assist.

## 2012-09-28 NOTE — Telephone Encounter (Signed)
Last filled on 09/01/10 #30 with 1 additional refill Last seen on 09/10/12 Follow up on 10/19/12 Please advise. Thanks!!

## 2012-10-01 ENCOUNTER — Other Ambulatory Visit: Payer: Self-pay | Admitting: Family Medicine

## 2012-10-01 MED ORDER — BUTALBITAL-ASA-CAFF-CODEINE 50-325-40-30 MG PO CAPS: 1.0000 | ORAL_CAPSULE | ORAL | Status: DC | PRN

## 2012-10-01 NOTE — Telephone Encounter (Signed)
Called to the pharmacy.

## 2012-10-12 ENCOUNTER — Encounter: Payer: Self-pay | Admitting: Internal Medicine

## 2012-10-16 ENCOUNTER — Other Ambulatory Visit (INDEPENDENT_AMBULATORY_CARE_PROVIDER_SITE_OTHER): Payer: Medicare Other

## 2012-10-16 DIAGNOSIS — Z79899 Other long term (current) drug therapy: Secondary | ICD-10-CM

## 2012-10-16 DIAGNOSIS — M79606 Pain in leg, unspecified: Secondary | ICD-10-CM

## 2012-10-16 DIAGNOSIS — I10 Essential (primary) hypertension: Secondary | ICD-10-CM

## 2012-10-16 DIAGNOSIS — E785 Hyperlipidemia, unspecified: Secondary | ICD-10-CM

## 2012-10-16 DIAGNOSIS — M79609 Pain in unspecified limb: Secondary | ICD-10-CM

## 2012-10-16 LAB — BASIC METABOLIC PANEL
BUN: 18 mg/dL (ref 6–23)
CO2: 28 mEq/L (ref 19–32)
Calcium: 9.3 mg/dL (ref 8.4–10.5)
Chloride: 97 mEq/L (ref 96–112)
Creatinine, Ser: 1 mg/dL (ref 0.4–1.2)
GFR: 59.79 mL/min — ABNORMAL LOW (ref >60.00)
Glucose, Bld: 97 mg/dL (ref 70–99)
Potassium: 4.2 mEq/L (ref 3.5–5.1)
Sodium: 133 mEq/L — ABNORMAL LOW (ref 135–145)

## 2012-10-16 LAB — LIPID PANEL
Cholesterol: 237 mg/dL — ABNORMAL HIGH (ref 0–200)
HDL: 92.8 mg/dL (ref >39.00)
Total CHOL/HDL Ratio: 3
Triglycerides: 60 mg/dL (ref 0.0–149.0)
VLDL: 12 mg/dL (ref 0.0–40.0)

## 2012-10-16 LAB — CBC WITH DIFFERENTIAL/PLATELET
Basophils Absolute: 0.1 10*3/uL (ref 0.0–0.1)
Basophils Relative: 1.1 % (ref 0.0–3.0)
Eosinophils Absolute: 0.1 10*3/uL (ref 0.0–0.7)
Eosinophils Relative: 2.7 % (ref 0.0–5.0)
HCT: 46.8 % — ABNORMAL HIGH (ref 36.0–46.0)
Hemoglobin: 16.2 g/dL — ABNORMAL HIGH (ref 12.0–15.0)
Lymphocytes Relative: 18.9 % (ref 12.0–46.0)
Lymphs Abs: 1 10*3/uL (ref 0.7–4.0)
MCHC: 34.6 g/dL (ref 30.0–36.0)
MCV: 88.9 fl (ref 78.0–100.0)
Monocytes Absolute: 0.4 10*3/uL (ref 0.1–1.0)
Monocytes Relative: 7.6 % (ref 3.0–12.0)
Neutro Abs: 3.6 10*3/uL (ref 1.4–7.7)
Neutrophils Relative %: 69.7 % (ref 43.0–77.0)
Platelets: 241 10*3/uL (ref 150.0–400.0)
RBC: 5.27 Mil/uL — ABNORMAL HIGH (ref 3.87–5.11)
RDW: 13.1 % (ref 11.5–14.6)
WBC: 5.2 10*3/uL (ref 4.5–10.5)

## 2012-10-16 LAB — HEPATIC FUNCTION PANEL
ALT: 19 U/L (ref 0–35)
AST: 21 U/L (ref 0–37)
Albumin: 3.9 g/dL (ref 3.5–5.2)
Alkaline Phosphatase: 65 U/L (ref 39–117)
Bilirubin, Direct: 0.1 mg/dL (ref 0.0–0.3)
Total Bilirubin: 0.6 mg/dL (ref 0.3–1.2)
Total Protein: 6.4 g/dL (ref 6.0–8.3)

## 2012-10-16 LAB — TSH: TSH: 0.88 u[IU]/mL (ref 0.35–5.50)

## 2012-10-16 LAB — LDL CHOLESTEROL, DIRECT: Direct LDL: 135.9 mg/dL

## 2012-10-19 ENCOUNTER — Ambulatory Visit (INDEPENDENT_AMBULATORY_CARE_PROVIDER_SITE_OTHER): Payer: Medicare Other | Admitting: Internal Medicine

## 2012-10-19 ENCOUNTER — Encounter: Payer: Self-pay | Admitting: Internal Medicine

## 2012-10-19 VITALS — BP 146/95 | HR 79 | Temp 98.1°F | Wt 143.0 lb

## 2012-10-19 DIAGNOSIS — M5430 Sciatica, unspecified side: Secondary | ICD-10-CM

## 2012-10-19 DIAGNOSIS — R51 Headache: Secondary | ICD-10-CM

## 2012-10-19 DIAGNOSIS — Z79899 Other long term (current) drug therapy: Secondary | ICD-10-CM

## 2012-10-19 DIAGNOSIS — G8929 Other chronic pain: Secondary | ICD-10-CM | POA: Insufficient documentation

## 2012-10-19 DIAGNOSIS — G500 Trigeminal neuralgia: Secondary | ICD-10-CM

## 2012-10-19 DIAGNOSIS — E871 Hypo-osmolality and hyponatremia: Secondary | ICD-10-CM | POA: Insufficient documentation

## 2012-10-19 DIAGNOSIS — I10 Essential (primary) hypertension: Secondary | ICD-10-CM

## 2012-10-19 MED ORDER — BENAZEPRIL HCL 10 MG PO TABS: 10.0000 mg | ORAL_TABLET | Freq: Every day | ORAL | Status: DC

## 2012-10-19 MED ORDER — BUTALBITAL-ASA-CAFF-CODEINE 50-325-40-30 MG PO CAPS: 1.0000 | ORAL_CAPSULE | ORAL | Status: DC | PRN

## 2012-10-19 MED ORDER — FLUTICASONE PROPIONATE 50 MCG/ACT NA SUSP: NASAL | Status: DC

## 2012-10-19 NOTE — Progress Notes (Signed)
Chief Complaint  Patient presents with  . Follow-up    HPI:  Patient comes in today for follow up of  multiple medical problems.    Here with her BP readings and her monitor today   BP  readings are anywhere from 125 06/23/1957 over high 8201 with one reading 112.  Average is probably high 140s.  She's taking amlodipine and Maxzide without significant side effect. Wonders also if her blood pressure issues are somewhat stress   HAs  ; new or kind of headache that she thinks could be chronic sinusitis and gets it Almost every night in a.m. described as mid face and some drainage that gets better after she is up and around has her coffee.  Better  And massage face else drains down throat.  And then hot shower.  And get better. Better  With coffee .  In am but states it doesn't feel like a coffee withdrawal or problem headache   In regards to her trigeminal neuralgia she has had to remain onTegretol    300 mg bid   For this now.  Break through pain  At times. Not increase at this time.  Chewing and coming back but not as bad. Had a increase in some breakthrough symptoms when she had a new prescriptions given although the cops will looks exactly the same wonders if the formulation is different.  It is just on a when necessary followup with a surgeon at [facility] migraine   About  Once a months  Over every 3 weeks. Had taken the Fioricet for pain when she had the sciatica so is running out usually gets these about once a month much less than she used to   Hx of caffiene  HAs.    When she saw Dr. Dyann Kief in years ago with chronic headaches.  Sciatica symptoms are a lot better are still present but manageable no referral requested at this time would just see how things go  ROS: See pertinent positives and negatives per HPI. No chest pain shortness of breath syncope. Or lightheadedness.  Past Medical History  Diagnosis Date  . GERD (gastroesophageal reflux disease)   .  Hyperlipidemia   . Hypertension     echo nl lv function  mild dilitation 2009  . Migraine     hypnic HA eval by Dr. Earley Favor in the past  . Abnormal blood finding     elevated Hg and hct    . 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 Hearing aid worn   . Polycythemia   . Trigeminal neuralgia pain   . Closed head injury 02/01/2011    from syncope and had scalp laceration  neg ct .    Marland Kitchen Syncope 02/01/2011    In shower on vacation  sustained head laceration  8 sutures Had ed visit neg head ct labs and x ray   . 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 Medication side effect 09/02/2010    Poss muscle se of 10 crestor     Family History  Problem Relation Age of Onset  . Ovarian cancer Mother   . Stroke Mother   . Alcohol abuse Father   . Stroke Father   . Seizures Daughter   . Hypertension    . Diabetes Brother   . Cancer Paternal Aunt     leukemia, unknown  type    History   Social History  . Marital Status: Married    Spouse Name: N/A    Number of Children: 2  . Years of Education: N/A   Occupational History  .      retired Forensic psychologist   Social History Main Topics  . Smoking status: Never Smoker   . Smokeless tobacco: None  . Alcohol Use: Yes     Comment: twice a week  . Drug Use: No  . Sexually Active: None   Other Topics Concern  . None   Social History Narrative   Married   HH of 2-3 (god daughter)   Pets 2 dogs   Non smoker    Child is a physician   G2P2                Outpatient Encounter Prescriptions as of 10/19/2012  Medication Sig Dispense Refill  . albuterol (PROVENTIL HFA;VENTOLIN HFA) 108 (90 BASE) MCG/ACT inhaler Inhale 2 puffs into the lungs every 6 (six) hours as needed.  3 Inhaler  0  . amLODipine (NORVASC) 5 MG tablet Take 1 tablet (5 mg total) by mouth daily.  90 tablet  1  . budesonide-formoterol (SYMBICORT) 80-4.5 MCG/ACT inhaler Inhale 2 puffs into the lungs 2  (two) times daily.  3 Inhaler  3  . butalbital-aspirin-caffeine-codeine (FIORINAL WITH CODEINE) 50-325-40-30 MG capsule Take 1 capsule by mouth every 4 (four) hours as needed for migraine. 1-2 by mouth every 4-6 hours as needed max of 6 tabs in 24 hours  30 capsule  0  . carbamazepine (TEGRETOL) 100 MG chewable tablet Chew 300 mg by mouth 2 (two) times daily. Or as directed      . triamterene-hydrochlorothiazide (MAXZIDE-25) 37.5-25 MG per tablet Take 1 each (1 tablet total) by mouth daily.  90 tablet  1  . [DISCONTINUED] butalbital-aspirin-caffeine-codeine (FIORINAL WITH CODEINE) 50-325-40-30 MG capsule Take 1 capsule by mouth every 4 (four) hours as needed for migraine. 1-2 by mouth every 4-6 hours as needed max of 6 tabs in 24 hours  30 capsule  0  . benazepril (LOTENSIN) 10 MG tablet Take 1 tablet (10 mg total) by mouth daily.  30 tablet  3  . fluticasone (FLONASE) 50 MCG/ACT nasal spray 2 spray each nostril qd  16 g  3   No facility-administered encounter medications on file as of 10/19/2012.    EXAM:  BP 146/95  Pulse 79  Temp(Src) 98.1 F (36.7 C) (Oral)  Wt 143 lb (64.864 kg)  BMI 23.43 kg/m2  SpO2 97%  Body mass index is 23.43 kg/(m^2).  GENERAL: vitals reviewed and listed above, alert, oriented, appears well hydrated and in no acute distress Blood pressure readings repeated right and left in office and with her monitor. Diastolics are in the low 62I 88 systolic my readings 1 9485 machine readings 146. HEENT: atraumatic, conjunctiva  clear, no obvious abnormalities on inspection of external nose and ears OP : no lesion edema or exudate   NECK: no obvious masses on inspection palpation   CV: HRRR, no clubbing cyanosis nl cap refill   MS: moves all extremities without noticeable focal  abnormality  PSYCH: pleasant and cooperative, no obvious depression or anxiety Lab Results  Component Value Date   WBC 5.2 10/16/2012   HGB 16.2* 10/16/2012   HCT 46.8* 10/16/2012   PLT 241.0  10/16/2012   GLUCOSE 97 10/16/2012   CHOL 237* 10/16/2012   TRIG 60.0 10/16/2012   HDL 92.80  10/16/2012   LDLDIRECT 135.9 10/16/2012   LDLCALC 92 02/25/2008   ALT 19 10/16/2012   AST 21 10/16/2012   NA 133* 10/16/2012   K 4.2 10/16/2012   CL 97 10/16/2012   CREATININE 1.0 10/16/2012   BUN 18 10/16/2012   CO2 28 10/16/2012   TSH 0.88 10/16/2012   Blood pressure readings reviewed with patient that she brings from home. ASSESSMENT AND PLAN:  Discussed the following assessment and plan:  HYPERTENSION - Not optimal careful addition of a low-dose ACE inhibitor patient warned about potential of metabolic changes and dizziness orthostasis. Contact in the meantime  Sciatic neuritis, unspecified laterality - Improved  Chronic headaches morning - For the last few months no symptoms of sleep apnea thinks it's a sinus problem and Flonase trial followup carefully  Trigeminal neuralgia  Medication management  Low sodium levels  133 - No history of same is on diuretic monitor  We'll refill her migraine medicine at this time she did use some extra for her sciatica pain doesn't think it's triggered other headaches. Add Flonase monitor chemistry panel in 2 weeks because of her adding an ACE inhibitor to her diuretic.  Followup in about a month and see where all these issues are.  If not getting better consider having headache neurology consult.  Suggestions speak with the pharmacist and plan if they think there were any change in her Tegretol pills. -Patient advised to return or notify health care team  if symptoms worsen or persist or new concerns arise.  Patient Instructions  Uncertain cause of am headaches  Try nasal steroids for 3-4 weeks and monitor  yhese  headaches .  Bp higher than optimum.   Continue current regimen but add a low-dose ACE inhibitor. And we'll continue to monitor blood pressure come back in a month   check a chemistry panel in 2 weeks after adding the new medicine to make sure  the sodium and potassium level are still in an okay range.  ACE inhibitor scan increase the potassium level in the blood stream   We'll refill your headache medicine at this time     [name]. [name] M.D.

## 2012-10-19 NOTE — Patient Instructions (Addendum)
Uncertain cause of am headaches  Try nasal steroids for 3-4 weeks and monitor  yhese  headaches .  Bp higher than optimum.   Continue current regimen but add a low-dose ACE inhibitor. And we'll continue to monitor blood pressure come back in a month   check a chemistry panel in 2 weeks after adding the new medicine to make sure the sodium and potassium level are still in an okay range.  ACE inhibitor scan increase the potassium level in the blood stream   We'll refill your headache medicine at this time

## 2012-11-01 ENCOUNTER — Other Ambulatory Visit (INDEPENDENT_AMBULATORY_CARE_PROVIDER_SITE_OTHER): Payer: Medicare Other

## 2012-11-01 DIAGNOSIS — I10 Essential (primary) hypertension: Secondary | ICD-10-CM

## 2012-11-01 LAB — BASIC METABOLIC PANEL
BUN: 18 mg/dL (ref 6–23)
CO2: 25 mEq/L (ref 19–32)
Calcium: 9.7 mg/dL (ref 8.4–10.5)
Chloride: 106 mEq/L (ref 96–112)
Creatinine, Ser: 0.9 mg/dL (ref 0.4–1.2)
GFR: 69.62 mL/min (ref >60.00)
Glucose, Bld: 95 mg/dL (ref 70–99)
Potassium: 4.8 mEq/L (ref 3.5–5.1)
Sodium: 140 mEq/L (ref 135–145)

## 2012-11-02 ENCOUNTER — Other Ambulatory Visit: Payer: Medicare Other

## 2012-11-16 ENCOUNTER — Ambulatory Visit: Payer: Medicare Other | Admitting: Internal Medicine

## 2012-11-20 ENCOUNTER — Ambulatory Visit (INDEPENDENT_AMBULATORY_CARE_PROVIDER_SITE_OTHER): Payer: Medicare Other | Admitting: Internal Medicine

## 2012-11-20 ENCOUNTER — Encounter: Payer: Self-pay | Admitting: Internal Medicine

## 2012-11-20 VITALS — BP 138/90 | HR 73 | Temp 97.8°F | Wt 144.0 lb

## 2012-11-20 DIAGNOSIS — I10 Essential (primary) hypertension: Secondary | ICD-10-CM

## 2012-11-20 DIAGNOSIS — J349 Unspecified disorder of nose and nasal sinuses: Secondary | ICD-10-CM

## 2012-11-20 DIAGNOSIS — E871 Hypo-osmolality and hyponatremia: Secondary | ICD-10-CM

## 2012-11-20 DIAGNOSIS — G8929 Other chronic pain: Secondary | ICD-10-CM

## 2012-11-20 DIAGNOSIS — G500 Trigeminal neuralgia: Secondary | ICD-10-CM

## 2012-11-20 DIAGNOSIS — Z79899 Other long term (current) drug therapy: Secondary | ICD-10-CM

## 2012-11-20 DIAGNOSIS — J329 Chronic sinusitis, unspecified: Secondary | ICD-10-CM

## 2012-11-20 DIAGNOSIS — R51 Headache: Secondary | ICD-10-CM

## 2012-11-20 DIAGNOSIS — G4481 Hypnic headache: Secondary | ICD-10-CM

## 2012-11-20 MED ORDER — CARBAMAZEPINE 200 MG PO TABS: 400.0000 mg | ORAL_TABLET | Freq: Two times a day (BID) | ORAL | Status: DC

## 2012-11-20 MED ORDER — AMLODIPINE BESYLATE 5 MG PO TABS: 5.0000 mg | ORAL_TABLET | Freq: Every day | ORAL | Status: DC

## 2012-11-20 MED ORDER — BENAZEPRIL HCL 20 MG PO TABS: 20.0000 mg | ORAL_TABLET | Freq: Every day | ORAL | Status: DC

## 2012-11-20 NOTE — Progress Notes (Signed)
Chief Complaint  Patient presents with  . Follow-up  . Headache  . Hypertension    HPI:  Followup of a number of issues. Facial pain: Has adjusted her medication and it seems to be controlled now on the equivalent of 400 mg twice a day. She's been taking 300 mg capsule 100 mg chewable. Asks for an easier regimen to take. 400 mg bid  And  100 % better  "Sinus headaches" nocturnal and a.m. headaches in the face frontal and face continue she denies any congestion until she uses hot coffee to drink and press on her face and then it feels better in the morning flonase that we prescribed last time no help. Nocturnal headaches   On going except for last night,"   After caffine   Hot and helps forehead and then     Blood pressure readings she presents today. Diastolics range from 03-00 average is probably in the 92Z 3R 90 systolic pressures from 300-762. Doesn't really think the new regimen has made a difference in her headaches or her blood pressure readings. But no side effects. There is a lot of stress in the family times but these readings are pretty stable.  Is going to be [place] of [place] on a [place] in [place] 13[place].  ROS: See pertinent positives and negatives per HPI.  Past Medical History  Diagnosis Date  . GERD (gastroesophageal reflux disease)   . Hyperlipidemia   . Hypertension     echo nl lv function  mild dilitation 2009  . Migraine     hypnic HA eval by Dr. Earley Favor in the past  . Abnormal blood finding     elevated Hg and hct    . 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 Hearing aid worn   . Polycythemia   . Trigeminal neuralgia pain   . Closed head injury 02/01/2011    from syncope and had scalp laceration  neg ct .    Marland Kitchen Syncope 02/01/2011    In shower on vacation  sustained head laceration  8 sutures Had ed visit neg head ct labs and x ray   . 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 Medication  side effect 09/02/2010    Poss muscle se of 10 crestor     Family History  Problem Relation Age of Onset  . Ovarian cancer Mother   . Stroke Mother   . Alcohol abuse Father   . Stroke Father   . Seizures Daughter   . Hypertension    . Diabetes Brother   . Cancer Paternal Aunt     leukemia, unknown type    History   Social History  . Marital Status: Married    Spouse Name: N/A    Number of Children: 2  . Years of Education: N/A   Occupational History  .      retired Forensic psychologist   Social History Main Topics  . Smoking status: Never Smoker   . Smokeless tobacco: None  . Alcohol Use: Yes     Comment: twice a week  . Drug Use: No  . Sexually Active: None   Other Topics Concern  . None   Social History Narrative   Married   HH of 2-3 (god daughter)   Pets 2 dogs   Non smoker    Child is a Engineer, drilling  G2P2                Outpatient Encounter Prescriptions as of 11/20/2012  Medication Sig Dispense Refill  . albuterol (PROVENTIL HFA;VENTOLIN HFA) 108 (90 BASE) MCG/ACT inhaler Inhale 2 puffs into the lungs every 6 (six) hours as needed.  3 Inhaler  0  . amLODipine (NORVASC) 5 MG tablet Take 1 tablet (5 mg total) by mouth daily.  90 tablet  3  . benazepril (LOTENSIN) 20 MG tablet Take 1 tablet (20 mg total) by mouth daily.  90 tablet  3  . budesonide-formoterol (SYMBICORT) 80-4.5 MCG/ACT inhaler Inhale 2 puffs into the lungs 2 (two) times daily.  3 Inhaler  3  . butalbital-aspirin-caffeine-codeine (FIORINAL WITH CODEINE) 50-325-40-30 MG capsule Take 1 capsule by mouth every 4 (four) hours as needed for migraine. 1-2 by mouth every 4-6 hours as needed max of 6 tabs in 24 hours  30 capsule  0  . triamterene-hydrochlorothiazide (MAXZIDE-25) 37.5-25 MG per tablet Take 1 each (1 tablet total) by mouth daily.  90 tablet  1  . [DISCONTINUED] amLODipine (NORVASC) 5 MG tablet Take 1 tablet (5 mg total) by mouth daily.  90 tablet  1  . [DISCONTINUED] benazepril (LOTENSIN)  10 MG tablet Take 1 tablet (10 mg total) by mouth daily.  30 tablet  3  . [DISCONTINUED] CarBAMazepine (TEGRETOL PO) Take 300 mg by mouth. Daily      . [DISCONTINUED] carbamazepine (TEGRETOL) 100 MG chewable tablet Chew 100 mg by mouth daily. Or as directed      . [DISCONTINUED] fluticasone (FLONASE) 50 MCG/ACT nasal spray 2 spray each nostril qd  16 g  3  . carbamazepine (TEGRETOL) 200 MG tablet Take 2 tablets (400 mg total) by mouth 2 (two) times daily.  360 tablet  3   No facility-administered encounter medications on file as of 11/20/2012.    EXAM:  BP 138/90  Pulse 73  Temp(Src) 97.8 F (36.6 C) (Oral)  Wt 144 lb (65.318 kg)  BMI 23.59 kg/m2  SpO2 97%  Body mass index is 23.59 kg/(m^2).  GENERAL: vitals reviewed and listed above, alert, oriented, appears well hydrated and in no acute distress nontoxic looks well  HEENT: atraumatic, conjunctiva  clear, no obvious abnormalities on inspection of external nose and ears  Blood pressure rechecked  MS: moves all extremities without noticeable focal  abnormality  PSYCH: pleasant and cooperative, no obvious depression or anxiety Lab Results  Component Value Date   WBC 5.2 10/16/2012   HGB 16.2* 10/16/2012   HCT 46.8* 10/16/2012   PLT 241.0 10/16/2012   GLUCOSE 95 11/01/2012   CHOL 237* 10/16/2012   TRIG 60.0 10/16/2012   HDL 92.80 10/16/2012   LDLDIRECT 135.9 10/16/2012   LDLCALC 92 02/25/2008   ALT 19 10/16/2012   AST 21 10/16/2012   NA 140 11/01/2012   K 4.8 11/01/2012   CL 106 11/01/2012   CREATININE 0.9 11/01/2012   BUN 18 11/01/2012   CO2 25 11/01/2012   TSH 0.88 10/16/2012    ASSESSMENT AND PLAN:  Discussed the following assessment and plan:  Chronic headaches morning - Described as sinus headaches no response to Flonase and treatment for her blood pressure. Plan ENT evaluation for opinion if she has sinus disease - Plan: Ambulatory referral to ENT  HYPERTENSION - Systolic stable diastolic could be better we can increase the  benazepril to 20 mg and then followup 2-3 months - Plan: amLODipine (NORVASC) 5 MG tablet  Hypnic headache  Medication management - Plan: amLODipine (NORVASC) 5 MG tablet  Trigeminal neuralgia - Does best at this time on 400 twice a day of Tegretol will change formulation  Low sodium levels  133 - Resolved  Sinus problem - Plan: Ambulatory referral to ENT  -Patient advised to return or notify health care team  if symptoms worsen or persist or new concerns arise.  Patient Instructions  Increase the benazapril to 20 mg   Continue the other medication. For blood pressure.  Will get ENT   Consult about the  Nocturnal sinus headaches.   Ok to stop the flonase as not helping.  Change tegretol to 400 mg twice a day.   ROV in about 2-3 months       Wanda K. [name] M.D.

## 2012-11-20 NOTE — Patient Instructions (Addendum)
Increase the benazapril to 20 mg   Continue the other medication. For blood pressure.  Will get ENT   Consult about the  Nocturnal sinus headaches.   Ok to stop the flonase as not helping.  Change tegretol to 400 mg twice a day.   ROV in about 2-3 months

## 2012-12-26 ENCOUNTER — Other Ambulatory Visit: Payer: Self-pay

## 2013-01-11 ENCOUNTER — Telehealth: Payer: Self-pay | Admitting: Internal Medicine

## 2013-01-11 NOTE — Telephone Encounter (Signed)
butalbital-aspirin-caffeine-codeine ([place]) 50-325-40-30 MG capsule [facility]  Pt is traveling and realized she is out.

## 2013-01-11 NOTE — Telephone Encounter (Signed)
REFILL X 1

## 2013-01-11 NOTE — Telephone Encounter (Signed)
Last filled on 10/19/12 #30 with 0 additional refills Has an upcoming appointment on 01/16/13 Last seen on 11/20/12 Please advise. Thanks!

## 2013-01-14 ENCOUNTER — Other Ambulatory Visit: Payer: Self-pay | Admitting: Family Medicine

## 2013-01-14 MED ORDER — BUTALBITAL-ASA-CAFF-CODEINE 50-325-40-30 MG PO CAPS: 1.0000 | ORAL_CAPSULE | ORAL | Status: DC | PRN

## 2013-01-14 NOTE — Telephone Encounter (Signed)
Called to the pharmacy and left on voicemail. 

## 2013-01-15 ENCOUNTER — Other Ambulatory Visit: Payer: Self-pay | Admitting: Otolaryngology

## 2013-01-16 ENCOUNTER — Ambulatory Visit (INDEPENDENT_AMBULATORY_CARE_PROVIDER_SITE_OTHER): Payer: Self-pay | Admitting: Internal Medicine

## 2013-01-16 ENCOUNTER — Encounter: Payer: Self-pay | Admitting: Internal Medicine

## 2013-01-16 VITALS — BP 120/80 | HR 76 | Temp 97.4°F | Wt 140.0 lb

## 2013-01-16 DIAGNOSIS — M545 Low back pain, unspecified: Secondary | ICD-10-CM | POA: Insufficient documentation

## 2013-01-16 DIAGNOSIS — M5431 Sciatica, right side: Secondary | ICD-10-CM

## 2013-01-16 DIAGNOSIS — M5430 Sciatica, unspecified side: Secondary | ICD-10-CM

## 2013-01-16 MED ORDER — TRAMADOL HCL 50 MG PO TABS: 50.0000 mg | ORAL_TABLET | Freq: Three times a day (TID) | ORAL | Status: DC | PRN

## 2013-01-16 NOTE — Patient Instructions (Addendum)
We will arrange  MRI of the spine.   And referral to neuro surgery.  For this .   Ultram if needed  As add on  Can make you drowsy.   Motrin of tylenol ok also .

## 2013-01-16 NOTE — Progress Notes (Signed)
Chief Complaint  Patient presents with  . Back Pain    Has pain in her back and down the back of her leg on the rt side.  Started several months ago.  . Leg Pain    HPI: Ongoing right leg radiating pain and hard  To function  lasting over the last  3-4  Months not better .  No weakness.  Pain at nighrt aches and throbs and hard to sleep.  Worse with leg use.  No bowel bladder issues .   No fevers.  Pain  rx  Ibuprofen  advil and bad  With codeine meant for her ha or sinus surgery . Had sinus surgery yesterday   .  ROS: See pertinent positives and negatives per HPI. No falling fever etc  See past notes  pred  Course no resolution  Past Medical History  Diagnosis Date  . GERD (gastroesophageal reflux disease)   . Hyperlipidemia   . Hypertension     echo nl lv function  mild dilitation 2009  . Migraine     hypnic HA eval by Dr. Earley Favor in the past  . Abnormal blood finding     elevated Hg and hct    . 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 Hearing aid worn   . Polycythemia   . Trigeminal neuralgia pain   . Closed head injury 02/01/2011    from syncope and had scalp laceration  neg ct .    Marland Kitchen Syncope 02/01/2011    In shower on vacation  sustained head laceration  8 sutures Had ed visit neg head ct labs and x ray   . 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 Medication side effect 09/02/2010    Poss muscle se of 10 crestor     Family History  Problem Relation Age of Onset  . Ovarian cancer Mother   . Stroke Mother   . Alcohol abuse Father   . Stroke Father   . Seizures Daughter   . Hypertension    . Diabetes Brother   . Cancer Paternal Aunt     leukemia, unknown type    History   Social History  . Marital Status: Married    Spouse Name: N/A    Number of Children: 2  . Years of Education: N/A   Occupational History  .      retired Forensic psychologist   Social History Main Topics  . Smoking status:  Never Smoker   . Smokeless tobacco: Not on file  . Alcohol Use: Yes     Comment: twice a week  . Drug Use: No  . Sexual Activity: Not on file   Other Topics Concern  . Not on file   Social History Narrative   Married   HH of 2-3 (god daughter)   Pets 2 dogs   Non smoker    Child is a physician   G2P2                Outpatient Encounter Prescriptions as of 01/16/2013  Medication Sig Dispense Refill  . albuterol (PROVENTIL HFA;VENTOLIN HFA) 108 (90 BASE) MCG/ACT inhaler Inhale 2 puffs into the lungs every 6 (six) hours as needed.  3 Inhaler  0  . amLODipine (NORVASC) 5 MG tablet Take 1 tablet (5 mg total) by mouth daily.  90 tablet  3  . benazepril (LOTENSIN)  20 MG tablet Take 1 tablet (20 mg total) by mouth daily.  90 tablet  3  . butalbital-aspirin-caffeine-codeine (FIORINAL WITH CODEINE) 50-325-40-30 MG capsule Take 1 capsule by mouth every 4 (four) hours as needed for migraine. 1-2 by mouth every 4-6 hours as needed max of 6 tabs in 24 hours  30 capsule  0  . carbamazepine (TEGRETOL) 200 MG tablet Take 2 tablets (400 mg total) by mouth 2 (two) times daily.  360 tablet  3  . triamterene-hydrochlorothiazide (MAXZIDE-25) 37.5-25 MG per tablet Take 1 each (1 tablet total) by mouth daily.  90 tablet  1  . budesonide-formoterol (SYMBICORT) 80-4.5 MCG/ACT inhaler Inhale 2 puffs into the lungs 2 (two) times daily.  3 Inhaler  3  . traMADol (ULTRAM) 50 MG tablet Take 1 tablet (50 mg total) by mouth every 8 (eight) hours as needed for pain.  30 tablet  0   No facility-administered encounter medications on file as of 01/16/2013.    EXAM:  BP 120/80  Pulse 76  Temp(Src) 97.4 F (36.3 C) (Oral)  Wt 140 lb (63.504 kg)  BMI 22.93 kg/m2  SpO2 98%  Body mass index is 22.93 kg/(m^2).  GENERAL: vitals reviewed and listed above, alert, oriented, appears well hydrated and in no acute distress  HEENT: atraumatic, conjunctiva  clear, no obvious abnormalities on inspection of external nose  and ears congested  NECK: no obvious masses on inspection palpation . Back right  Limping gait heel toe ok hard to elicit  dtrs no weakness seen .  MS: moves all extremities without noticeable focal  abnormality PSYCH: pleasant and cooperative, no obvious depression or anxiety  ASSESSMENT AND PLAN:  Discussed the following assessment and plan:  Low back pain radiating to right leg - persitent and some nocturna  no weakness  get mri if possible and ns consult ( has very smal plate near area of facial nerversugery to ask if ok for mri - Plan: MR Lumbar Spine Wo Contrast, Ambulatory referral to Neurosurgery  Sciatic neuritis, right    Pt also to call may  about   In surgery  Implant and mri limitations? can add on ultram with caution until sees specialist  .  -Patient advised to return or notify health care team   symptoms worsen or persist or new concerns arise.  Patient Instructions  We will arrange  MRI of the spine.   And referral to neuro surgery.  For this .   Ultram if needed  As add on  Can make you drowsy.   Motrin of tylenol ok also .    [name]. Cydnie Deason M.D.

## 2013-01-25 ENCOUNTER — Ambulatory Visit
Admission: RE | Admit: 2013-01-25 | Discharge: 2013-01-25 | Disposition: A | Discharge status: Home / Self Care | Payer: Medicare Other | Source: Ambulatory Visit | Attending: Internal Medicine | Admitting: Internal Medicine

## 2013-01-25 DIAGNOSIS — M545 Low back pain, unspecified: Secondary | ICD-10-CM

## 2013-01-28 ENCOUNTER — Encounter: Payer: Self-pay | Admitting: Internal Medicine

## 2013-03-05 ENCOUNTER — Ambulatory Visit (INDEPENDENT_AMBULATORY_CARE_PROVIDER_SITE_OTHER): Payer: Medicare Other | Admitting: Family Medicine

## 2013-03-05 DIAGNOSIS — Z23 Encounter for immunization: Secondary | ICD-10-CM

## 2013-04-22 ENCOUNTER — Ambulatory Visit (INDEPENDENT_AMBULATORY_CARE_PROVIDER_SITE_OTHER): Payer: Medicare Other | Admitting: Internal Medicine

## 2013-04-22 ENCOUNTER — Encounter: Payer: Self-pay | Admitting: Internal Medicine

## 2013-04-22 ENCOUNTER — Telehealth: Payer: Self-pay | Admitting: Internal Medicine

## 2013-04-22 VITALS — BP 150/90 | HR 81 | Temp 98.2°F | Wt 139.0 lb

## 2013-04-22 DIAGNOSIS — R300 Dysuria: Secondary | ICD-10-CM

## 2013-04-22 DIAGNOSIS — N390 Urinary tract infection, site not specified: Secondary | ICD-10-CM

## 2013-04-22 DIAGNOSIS — R6883 Chills (without fever): Secondary | ICD-10-CM

## 2013-04-22 DIAGNOSIS — R110 Nausea: Secondary | ICD-10-CM

## 2013-04-22 DIAGNOSIS — M549 Dorsalgia, unspecified: Secondary | ICD-10-CM

## 2013-04-22 LAB — POCT URINALYSIS DIPSTICK
Bilirubin, UA: NEGATIVE
Blood, UA: NEGATIVE
Glucose, UA: NEGATIVE
Ketones, UA: NEGATIVE
Nitrite, UA: POSITIVE
Protein, UA: NEGATIVE
Spec Grav, UA: 1.02
Urobilinogen, UA: 1
pH, UA: 8

## 2013-04-22 MED ORDER — CIPROFLOXACIN HCL 250 MG PO TABS: 250.0000 mg | ORAL_TABLET | Freq: Two times a day (BID) | ORAL | Status: DC

## 2013-04-22 NOTE — Telephone Encounter (Signed)
Patient Information:  Caller Name: Laini  Phone: [phone]  Patient: [name]  Gender: Female  DOB: 06/02/1940  Age: 72 Years  PCP: [name] [facility])  Office Follow Up:  Does the office need to follow up with this patient?: No  Instructions For The Office: N/A   Symptoms  Reason For Call & Symptoms: Cloudy urine, painful, nauseated, ha, chills.  Low back pain.  Reviewed Health History In EMR: Yes  Reviewed Medications In EMR: Yes  Reviewed Allergies In EMR: Yes  Reviewed Surgeries / Procedures: Yes  Date of Onset of Symptoms: 04/15/2013  Treatments Tried: Cranberry juice  Treatments Tried Worked: No  Guideline(s) Used:  Urination Pain - Female  Disposition Per Guideline:   Go to Office Now  Reason For Disposition Reached:   Side (flank) or lower back pain present  Advice Given:  Fluids:   Drink extra fluids. Drink 8-10 glasses of liquids a day (Reason: to produce a dilute, non-irritating urine).  Cranberry Juice:   Dosage Cranberry Juice Cocktail: 8 oz (240 ml) twice a day.  Call Back If:  You become worse.  Patient Will Follow Care Advice:  YES  Appointment Scheduled:  04/22/2013 15:00:00 Appointment Scheduled Provider:  [name] [facility])

## 2013-04-22 NOTE — Progress Notes (Signed)
Chief Complaint  Patient presents with  . Dysuria  . Nausea  . Back Pain    HPI: Patient comes in today for SDA for  new problem evaluation. Chills  ONSET A WEEK AAGO WAXING AND WANING  Worse this am  Acts like cystitis  Low back ache .  Had ha and nausea also no vomiting or fever.  No hematuria   ROS: See pertinent positives and negatives per HPI. Leaving country in 6 days  Past Medical History  Diagnosis Date  . GERD (gastroesophageal reflux disease)   . Hyperlipidemia   . Hypertension     echo nl lv function  mild dilitation 2009  . Migraine     hypnic HA eval by Dr. Earley Favor in the past  . Abnormal blood finding     elevated Hg and hct    . 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 Hearing aid worn   . Polycythemia   . Trigeminal neuralgia pain   . Closed head injury 02/01/2011    from syncope and had scalp laceration  neg ct .    Marland Kitchen Syncope 02/01/2011    In shower on vacation  sustained head laceration  8 sutures Had ed visit neg head ct labs and x ray   . 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 Medication side effect 09/02/2010    Poss muscle se of 10 crestor     Family History  Problem Relation Age of Onset  . Ovarian cancer Mother   . Stroke Mother   . Alcohol abuse Father   . Stroke Father   . Seizures Daughter   . Hypertension    . Diabetes Brother   . Cancer Paternal Aunt     leukemia, unknown type    History   Social History  . Marital Status: Married    Spouse Name: N/A    Number of Children: 2  . Years of Education: N/A   Occupational History  .      retired Forensic psychologist   Social History Main Topics  . Smoking status: Never Smoker   . Smokeless tobacco: None  . Alcohol Use: Yes     Comment: twice a week  . Drug Use: No  . Sexual Activity: None   Other Topics Concern  . None   Social History Narrative   Married   HH of 2-3 (god daughter)   Pets 2 dogs   Non  smoker    Child is a physician   G2P2                Outpatient Encounter Prescriptions as of 04/22/2013  Medication Sig  . albuterol (PROVENTIL HFA;VENTOLIN HFA) 108 (90 BASE) MCG/ACT inhaler Inhale 2 puffs into the lungs every 6 (six) hours as needed.  Marland Kitchen amLODipine (NORVASC) 5 MG tablet Take 1 tablet (5 mg total) by mouth daily.  . benazepril (LOTENSIN) 20 MG tablet Take 1 tablet (20 mg total) by mouth daily.  . butalbital-aspirin-caffeine-codeine (FIORINAL WITH CODEINE) 50-325-40-30 MG capsule Take 1 capsule by mouth every 4 (four) hours as needed for migraine. 1-2 by mouth every 4-6 hours as needed max of 6 tabs in 24 hours  . carbamazepine (TEGRETOL) 200 MG tablet Take 2 tablets (400 mg total) by mouth 2 (two) times daily.  Marland Kitchen triamterene-hydrochlorothiazide (MAXZIDE-25) 37.5-25 MG per tablet Take 1 each (1 tablet  total) by mouth daily.  . budesonide-formoterol (SYMBICORT) 80-4.5 MCG/ACT inhaler Inhale 2 puffs into the lungs 2 (two) times daily.  . ciprofloxacin (CIPRO) 250 MG tablet Take 1 tablet (250 mg total) by mouth 2 (two) times daily.  . traMADol (ULTRAM) 50 MG tablet Take 1 tablet (50 mg total) by mouth every 8 (eight) hours as needed for pain.    EXAM:  BP 150/90  Pulse 81  Temp(Src) 98.2 F (36.8 C) (Oral)  Wt 139 lb (63.05 kg)  SpO2 96%  Body mass index is 22.77 kg/(m^2).  GENERAL: vitals reviewed and listed above, alert, oriented, appears well hydrated and in no acute distress non toxic   NECK: no obvious masses on inspection palpation  CV: HRRR, no clubbing cyanosis or  peripheral edema nl  Abdomen:  Sof,t normal bowel sounds without hepatosplenomegaly, no guarding rebound or masses no CVA tenderness mild tenderness suprapubic area  MS: moves all extremities  PSYCH: pleasant and cooperative, no obvious depression or anxiety  ASSESSMENT AND PLAN:  Discussed the following assessment and plan:  UTI (urinary tract infection) - Plan: Urine culture  Dysuria -  Plan: POC Urinalysis Dipstick  Nausea alone - Plan: POC Urinalysis Dipstick  Back pain - Plan: POC Urinalysis Dipstick  Chills - Plan: POC Urinalysis Dipstick  -Patient advised to return or notify health care team  if symptoms worsen or persist or new concerns arise.  Patient Instructions  Agree this acts like cystitis  Take antibiotic  Should  Improve by end  Of medication.  Urinary Tract Infection Urinary tract infections (UTIs) can develop anywhere along your urinary tract. Your urinary tract is your body's drainage system for removing wastes and extra water. Your urinary tract includes two kidneys, two ureters, a bladder, and a urethra. Your kidneys are a pair of bean-shaped organs. Each kidney is about the size of your fist. They are located below your ribs, one on each side of your spine. CAUSES Infections are caused by microbes, which are microscopic organisms, including fungi, viruses, and bacteria. These organisms are so small that they can only be seen through a microscope. Bacteria are the microbes that most commonly cause UTIs. SYMPTOMS  Symptoms of UTIs may vary by age and gender of the patient and by the location of the infection. Symptoms in young women typically include a frequent and intense urge to urinate and a painful, burning feeling in the bladder or urethra during urination. Older women and men are more likely to be tired, shaky, and weak and have muscle aches and abdominal pain. A fever may mean the infection is in your kidneys. Other symptoms of a kidney infection include pain in your back or sides below the ribs, nausea, and vomiting. DIAGNOSIS To diagnose a UTI, your caregiver will ask you about your symptoms. Your caregiver also will ask to provide a urine sample. The urine sample will be tested for bacteria and white blood cells. White blood cells are made by your body to help fight infection. TREATMENT  Typically, UTIs can be treated with medication. Because most  UTIs are caused by a bacterial infection, they usually can be treated with the use of antibiotics. The choice of antibiotic and length of treatment depend on your symptoms and the type of bacteria causing your infection. HOME CARE INSTRUCTIONS  If you were prescribed antibiotics, take them exactly as your caregiver instructs you. Finish the medication even if you feel better after you have only taken some of the medication.  Drink  enough water and fluids to keep your urine clear or pale yellow.  Avoid caffeine, tea, and carbonated beverages. They tend to irritate your bladder.  Empty your bladder often. Avoid holding urine for long periods of time.  Empty your bladder before and after sexual intercourse.  After a bowel movement, women should cleanse from front to back. Use each tissue only once. SEEK MEDICAL CARE IF:   You have back pain.  You develop a fever.  Your symptoms do not begin to resolve within 3 days. SEEK IMMEDIATE MEDICAL CARE IF:   You have severe back pain or lower abdominal pain.  You develop chills.  You have nausea or vomiting.  You have continued burning or discomfort with urination. MAKE SURE YOU:   Understand these instructions.  Will watch your condition.  Will get help right away if you are not doing well or get worse. Document Released: 02/16/2005 Document Revised: 11/08/2011 Document Reviewed: 06/17/2011 [facility] Patient Information 2014 [place].       [name]. Danyia Borunda M.D.  allerg to sulfa  Risk benefit of med discussed

## 2013-04-22 NOTE — Patient Instructions (Signed)
Agree this acts like cystitis  Take antibiotic  Should  Improve by end  Of medication.  Urinary Tract Infection Urinary tract infections (UTIs) can develop anywhere along your urinary tract. Your urinary tract is your body's drainage system for removing wastes and extra water. Your urinary tract includes two kidneys, two ureters, a bladder, and a urethra. Your kidneys are a pair of bean-shaped organs. Each kidney is about the size of your fist. They are located below your ribs, one on each side of your spine. CAUSES Infections are caused by microbes, which are microscopic organisms, including fungi, viruses, and bacteria. These organisms are so small that they can only be seen through a microscope. Bacteria are the microbes that most commonly cause UTIs. SYMPTOMS  Symptoms of UTIs may vary by age and gender of the patient and by the location of the infection. Symptoms in young women typically include a frequent and intense urge to urinate and a painful, burning feeling in the bladder or urethra during urination. Older women and men are more likely to be tired, shaky, and weak and have muscle aches and abdominal pain. A fever may mean the infection is in your kidneys. Other symptoms of a kidney infection include pain in your back or sides below the ribs, nausea, and vomiting. DIAGNOSIS To diagnose a UTI, your caregiver will ask you about your symptoms. Your caregiver also will ask to provide a urine sample. The urine sample will be tested for bacteria and white blood cells. White blood cells are made by your body to help fight infection. TREATMENT  Typically, UTIs can be treated with medication. Because most UTIs are caused by a bacterial infection, they usually can be treated with the use of antibiotics. The choice of antibiotic and length of treatment depend on your symptoms and the type of bacteria causing your infection. HOME CARE INSTRUCTIONS  If you were prescribed antibiotics, take them exactly  as your caregiver instructs you. Finish the medication even if you feel better after you have only taken some of the medication.  Drink enough water and fluids to keep your urine clear or pale yellow.  Avoid caffeine, tea, and carbonated beverages. They tend to irritate your bladder.  Empty your bladder often. Avoid holding urine for long periods of time.  Empty your bladder before and after sexual intercourse.  After a bowel movement, women should cleanse from front to back. Use each tissue only once. SEEK MEDICAL CARE IF:   You have back pain.  You develop a fever.  Your symptoms do not begin to resolve within 3 days. SEEK IMMEDIATE MEDICAL CARE IF:   You have severe back pain or lower abdominal pain.  You develop chills.  You have nausea or vomiting.  You have continued burning or discomfort with urination. MAKE SURE YOU:   Understand these instructions.  Will watch your condition.  Will get help right away if you are not doing well or get worse. Document Released: 02/16/2005 Document Revised: 11/08/2011 Document Reviewed: 06/17/2011 [facility] Patient Information 2014 [place].

## 2013-04-22 NOTE — Addendum Note (Signed)
Addended by: Elmer Picker on: 04/22/2013 05:01 PM   Modules accepted: Orders

## 2013-04-25 ENCOUNTER — Other Ambulatory Visit: Payer: Self-pay | Admitting: *Deleted

## 2013-04-25 ENCOUNTER — Telehealth: Payer: Self-pay | Admitting: Internal Medicine

## 2013-04-25 LAB — URINE CULTURE: Colony Count: >=100000

## 2013-04-25 MED ORDER — NITROFURANTOIN MONOHYD MACRO 100 MG PO CAPS: 100.0000 mg | ORAL_CAPSULE | Freq: Two times a day (BID) | ORAL | Status: DC

## 2013-04-25 NOTE — Telephone Encounter (Signed)
Patient Information:  Caller Name: [name]  Phone: [phone]  Patient: [name]  Gender: Female  DOB: [birth year]  Age: 72 Years  PCP: [name] [facility])  Office Follow Up:  Does the office need to follow up with this patient?: Yes  Instructions For The Office: Pharmacy [facility] [phone].  Please contact patient. She is concerned she is leaving the country on Sunday December 7th.   Reviewed EPIC and Urine Culture. Please advise.  RN Note:  Pharmacy [facility] [phone].  Please contact patient. She is concerned she is leaving the country on Sunday December 7th.   Reviewed EPIC and Urine Culture. Please advise.  Symptoms  Reason For Call & Symptoms: Patient was seen in office 04/22/13 for cystitis and given 3 day course of Cipro.  Patient states she needs another round of medication.  slight ache in back but denies pain,   +pain with urination, urine cloudy, +urgency. Afebrile. She states she feels same.  REVIEWED URINE CULTURE. PATIENT WILL NEED TO SWITCH MEDS  Reviewed Health History In EMR: Yes  Reviewed Medications In EMR: Yes  Reviewed Allergies In EMR: Yes  Reviewed Surgeries / Procedures: Yes  Date of Onset of Symptoms: 04/22/2013  Treatments Tried: Cipro  Treatments Tried Worked: No  Guideline(s) Used:  Urination Pain - Female  Disposition Per Guideline:   See Today in Office  Reason For Disposition Reached:   Taking antibiotic > 3 days for UTI and painful urination not improved  Advice Given:  Fluids:   Drink extra fluids. Drink 8-10 glasses of liquids a day (Reason: to produce a dilute, non-irritating urine).  Call Back If:  You become worse.  RN Overrode Recommendation:  Patient Requests Prescription  Pharmacy [facility] [phone].  Please contact patient. She is concerned she is leaving the country on Sunday December 7th.   Reviewed EPIC and Urine Culture. Please advise.

## 2013-04-25 NOTE — Telephone Encounter (Signed)
Her c&s shows she is resistant to cipro- she is allergic to bactrim--please see c&s results and advise-thanks

## 2013-04-25 NOTE — Telephone Encounter (Signed)
Left message on machine For -------------------fyi to dr Regis Bill

## 2013-04-25 NOTE — Telephone Encounter (Signed)
See result note I agree

## 2013-04-25 NOTE — Telephone Encounter (Signed)
I did not see this patient. Looks like is sensitive to nitrofurantoin (macrobid) and that is the only other oral med on there that is sensitive. If PCP not available would suggest nitrofurantoin 168m bid for 7 days and she needs to see a doctor if worsening not improving. She can discuss risks of that medication with her pharmacisit.

## 2013-04-26 ENCOUNTER — Encounter: Payer: Self-pay | Admitting: Family Medicine

## 2013-05-01 ENCOUNTER — Other Ambulatory Visit: Payer: Self-pay | Admitting: Family Medicine

## 2013-05-01 DIAGNOSIS — I10 Essential (primary) hypertension: Secondary | ICD-10-CM

## 2013-05-01 DIAGNOSIS — M79606 Pain in leg, unspecified: Secondary | ICD-10-CM

## 2013-05-01 DIAGNOSIS — Z79899 Other long term (current) drug therapy: Secondary | ICD-10-CM

## 2013-05-01 DIAGNOSIS — E785 Hyperlipidemia, unspecified: Secondary | ICD-10-CM

## 2013-05-01 MED ORDER — TRIAMTERENE-HCTZ 37.5-25 MG PO TABS: 1.0000 | ORAL_TABLET | Freq: Every day | ORAL | Status: DC

## 2013-05-07 ENCOUNTER — Telehealth: Payer: Self-pay | Admitting: Internal Medicine

## 2013-05-07 ENCOUNTER — Encounter: Payer: Self-pay | Admitting: *Deleted

## 2013-05-07 ENCOUNTER — Other Ambulatory Visit: Payer: Self-pay | Admitting: Family Medicine

## 2013-05-07 DIAGNOSIS — R300 Dysuria: Secondary | ICD-10-CM

## 2013-05-07 DIAGNOSIS — Z87440 Personal history of urinary (tract) infections: Secondary | ICD-10-CM

## 2013-05-07 NOTE — Telephone Encounter (Signed)
Patient Information:  Caller Name: [name]  Phone: [phone]  Patient: [name]  Gender: Female  DOB: [birth year]  Age: 72 Years  PCP: [name] ([facility])  Office Follow Up:  Does the office need to follow up with this patient?: Yes  Instructions For The Office: Appt workin/needs krs/can  RN Note:  Has been treated for UTI with cipro and then 7 days of macrobid, which she completed 05/02/13.  States continues to have severe pain on urination, and also when she gets up from a sitting position.  States she has spasms when the bladder is more full, not when it's empty.  Afebrile.  She does state she has lower back pain since this began as well.  Per urination pain protocol, advised appt in office now; no appts available in any [place] office.  Info to office via Epic note for staff/provider appt management/workin.  Uses [facility].  May reach patient at [phone].  krs/can  Symptoms  Reason For Call & Symptoms: urinary symptoms after completion of cipro and macrobid  Reviewed Health History In EMR: Yes  Reviewed Medications In EMR: Yes  Reviewed Allergies In EMR: Yes  Reviewed Surgeries / Procedures: Yes  Date of Onset of Symptoms: 04/22/2013  Guideline(s) Used:  Urination Pain - Female  Disposition Per Guideline:   Go to Office Now  Reason For Disposition Reached:   Side (flank) or lower back pain present  Advice Given:  N/A  Patient Will Follow Care Advice:  YES

## 2013-05-07 NOTE — Telephone Encounter (Signed)
Placed the pt on your schedule tomorrow afternoon.  Continues to have sx after completion of antibiotics.

## 2013-05-08 ENCOUNTER — Ambulatory Visit: Payer: Medicare Other | Admitting: Family

## 2013-05-08 ENCOUNTER — Other Ambulatory Visit (INDEPENDENT_AMBULATORY_CARE_PROVIDER_SITE_OTHER): Payer: Medicare Other

## 2013-05-08 DIAGNOSIS — R300 Dysuria: Secondary | ICD-10-CM

## 2013-05-08 DIAGNOSIS — Z87440 Personal history of urinary (tract) infections: Secondary | ICD-10-CM

## 2013-05-08 LAB — POCT URINALYSIS DIPSTICK
Bilirubin, UA: NEGATIVE
Blood, UA: NEGATIVE
Glucose, UA: NEGATIVE
Ketones, UA: NEGATIVE
Leukocytes, UA: NEGATIVE
Nitrite, UA: NEGATIVE
Protein, UA: NEGATIVE
Spec Grav, UA: 1.01
Urobilinogen, UA: 0.2
pH, UA: 7

## 2013-05-10 LAB — URINE CULTURE
Colony Count: NO GROWTH
Organism ID, Bacteria: NO GROWTH

## 2013-05-14 ENCOUNTER — Encounter: Payer: Self-pay | Admitting: Internal Medicine

## 2013-05-27 ENCOUNTER — Encounter: Payer: Self-pay | Admitting: Internal Medicine

## 2013-05-29 ENCOUNTER — Encounter: Payer: Self-pay | Admitting: Internal Medicine

## 2013-05-29 ENCOUNTER — Ambulatory Visit (INDEPENDENT_AMBULATORY_CARE_PROVIDER_SITE_OTHER): Payer: Medicare Other | Admitting: Internal Medicine

## 2013-05-29 VITALS — BP 154/100 | Temp 97.5°F | Wt 133.0 lb

## 2013-05-29 DIAGNOSIS — Z1624 Resistance to multiple antibiotics: Secondary | ICD-10-CM

## 2013-05-29 DIAGNOSIS — IMO0002 Reserved for concepts with insufficient information to code with codable children: Secondary | ICD-10-CM

## 2013-05-29 DIAGNOSIS — R51 Headache: Secondary | ICD-10-CM

## 2013-05-29 DIAGNOSIS — R109 Unspecified abdominal pain: Secondary | ICD-10-CM | POA: Insufficient documentation

## 2013-05-29 DIAGNOSIS — R3989 Other symptoms and signs involving the genitourinary system: Secondary | ICD-10-CM

## 2013-05-29 DIAGNOSIS — R519 Headache, unspecified: Secondary | ICD-10-CM

## 2013-05-29 DIAGNOSIS — R300 Dysuria: Secondary | ICD-10-CM | POA: Insufficient documentation

## 2013-05-29 DIAGNOSIS — G8929 Other chronic pain: Secondary | ICD-10-CM

## 2013-05-29 DIAGNOSIS — Z1635 Resistance to multiple antimicrobial drugs: Secondary | ICD-10-CM

## 2013-05-29 HISTORY — DX: Unspecified abdominal pain: R10.9

## 2013-05-29 HISTORY — DX: Resistance to multiple antibiotics: Z16.24

## 2013-05-29 LAB — POCT URINALYSIS DIPSTICK
Bilirubin, UA: NEGATIVE
Blood, UA: NEGATIVE
Glucose, UA: NEGATIVE
Ketones, UA: NEGATIVE
Nitrite, UA: NEGATIVE
Protein, UA: NEGATIVE
Spec Grav, UA: 1.02
Urobilinogen, UA: 0.2
pH, UA: 7

## 2013-05-29 MED ORDER — BUTALBITAL-ASA-CAFF-CODEINE 50-325-40-30 MG PO CAPS: 1.0000 | ORAL_CAPSULE | ORAL | Status: DC | PRN

## 2013-05-29 NOTE — Telephone Encounter (Signed)
Patient Information:  Caller Name: Mizani  Phone: [phone]  Patient: [name]  Gender: Female  DOB: July 01, 1940  Age: 73 Years  PCP: [name] [facility])  Office Follow Up:  Does the office need to follow up with this patient?: No  Instructions For The Office: N/A   Symptoms  Reason For Call & Symptoms: 04/22/13 Treated for Cystitis with two rounds of abx.  05/08/13 After abx, urine test was negative.  Pt has continued to have discomfort, pain with urination, increasing fluids.   Nausea x 2 weeks.  05/29/13 continues to have discomfort, painful when bladder is full and when urinating, c/o nausea (no vomiting), afebrile, no back/side pain.  Reviewed Health History In EMR: Yes  Reviewed Medications In EMR: Yes  Reviewed Allergies In EMR: Yes  Reviewed Surgeries / Procedures: Yes  Date of Onset of Symptoms: 04/22/2013  Guideline(s) Used:  Urination Pain - Female  Disposition Per Guideline:   See Today in Office  Reason For Disposition Reached:   Age > 50 years  Advice Given:  Fluids:   Drink extra fluids. Drink 8-10 glasses of liquids a day (Reason: to produce a dilute, non-irritating urine).  Call Back If:  You become worse.  Patient Will Follow Care Advice:  YES  Appointment Scheduled:  05/29/2013 14:30:00 Appointment Scheduled Provider:  [name] [facility])

## 2013-05-29 NOTE — Progress Notes (Signed)
Chief Complaint  Patient presents with  . Dysuria    HPI: Patient comes in today for SDA for  problem evaluation. Pain with bladder filling.  But not dysuria . See last notes  uti e coli pan resistnat to oral meds except macrobid .  Given this rx and improved but bladder sx never resolved.  Got some better on second antibotioc  Had zithromax   For uri and cough. After trip to [place] .  Used codiene med  For her tooth isse and thus needs refil for her has when they come ROS: See pertinent positives and negatives per HPI. No cp sob  No  Obstructive sx per say . No other bladder meds   Had ua done last weeks per dr Megan Salon and NG  Ongoing  Now no fever chills   Past Medical History  Diagnosis Date  . GERD (gastroesophageal reflux disease)   . Hyperlipidemia   . Hypertension     echo nl lv function  mild dilitation 2009  . Migraine     hypnic HA eval by Dr. Earley Favor in the past  . Abnormal blood finding     elevated Hg and hct    . 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 Hearing aid worn   . Polycythemia   . Trigeminal neuralgia pain   . Closed head injury 02/01/2011    from syncope and had scalp laceration  neg ct .    Marland Kitchen Syncope 02/01/2011    In shower on vacation  sustained head laceration  8 sutures Had ed visit neg head ct labs and x ray   . 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 Medication side effect 09/02/2010    Poss muscle se of 10 crestor     Family History  Problem Relation Age of Onset  . Ovarian cancer Mother   . Stroke Mother   . Alcohol abuse Father   . Stroke Father   . Seizures Daughter   . Hypertension    . Diabetes Brother   . Cancer Paternal Aunt     leukemia, unknown type    History   Social History  . Marital Status: Married    Spouse Name: N/A    Number of Children: 2  . Years of Education: N/A   Occupational History  .      retired Forensic psychologist   Social History Main  Topics  . Smoking status: Never Smoker   . Smokeless tobacco: None  . Alcohol Use: Yes     Comment: twice a week  . Drug Use: No  . Sexual Activity: None   Other Topics Concern  . None   Social History Narrative   Married   HH of 2-3 (god daughter)   Pets 2 dogs   Non smoker    Child is a physician   G2P2                Outpatient Encounter Prescriptions as of 05/29/2013  Medication Sig  . albuterol (PROVENTIL HFA;VENTOLIN HFA) 108 (90 BASE) MCG/ACT inhaler Inhale 2 puffs into the lungs every 6 (six) hours as needed.  Marland Kitchen amLODipine (NORVASC) 5 MG tablet Take 1 tablet (5 mg total) by mouth daily.  . benazepril (LOTENSIN) 20 MG tablet Take 1 tablet (20 mg total) by mouth daily.  . budesonide-formoterol (SYMBICORT) 80-4.5 MCG/ACT inhaler Inhale 2  puffs into the lungs 2 (two) times daily.  . butalbital-aspirin-caffeine-codeine (FIORINAL WITH CODEINE) 50-325-40-30 MG capsule Take 1 capsule by mouth every 4 (four) hours as needed for migraine. 1-2 by mouth every 4-6 hours as needed max of 6 tabs in 24 hours  . butalbital-aspirin-caffeine-codeine (FIORINAL WITH CODEINE) 50-325-40-30 MG capsule Take 1 capsule by mouth every 4 (four) hours as needed for migraine. 1-2 by mouth every 4-6 hours as needed max of 6 tabs in 24 hours  . carbamazepine (TEGRETOL) 200 MG tablet Take 2 tablets (400 mg total) by mouth 2 (two) times daily.  Marland Kitchen triamterene-hydrochlorothiazide (MAXZIDE-25) 37.5-25 MG per tablet Take 1 tablet by mouth daily.  . [DISCONTINUED] butalbital-aspirin-caffeine-codeine (FIORINAL WITH CODEINE) 50-325-40-30 MG capsule Take 1 capsule by mouth every 4 (four) hours as needed for migraine. 1-2 by mouth every 4-6 hours as needed max of 6 tabs in 24 hours  . [DISCONTINUED] butalbital-aspirin-caffeine-codeine (FIORINAL WITH CODEINE) 50-325-40-30 MG capsule Take 1 capsule by mouth every 4 (four) hours as needed for migraine. 1-2 by mouth every 4-6 hours as needed max of 6 tabs in 24 hours  .  [DISCONTINUED] traMADol (ULTRAM) 50 MG tablet Take 1 tablet (50 mg total) by mouth every 8 (eight) hours as needed for pain.  . [DISCONTINUED] ciprofloxacin (CIPRO) 250 MG tablet Take 1 tablet (250 mg total) by mouth 2 (two) times daily.  . [DISCONTINUED] nitrofurantoin, macrocrystal-monohydrate, (MACROBID) 100 MG capsule Take 1 capsule (100 mg total) by mouth 2 (two) times daily.    EXAM:  BP 154/100  Temp(Src) 97.5 F (36.4 C) (Oral)  Wt 133 lb (60.328 kg)  Body mass index is 21.79 kg/(m^2).  GENERAL: vitals reviewed and listed above, alert, oriented, appears well hydrated and in no acute distress mild congestion HEENT: atraumatic, conjunctiva  clear, no obvious abnormalities on inspection of external nose and ears  Congested   NECK: no obvious masses on inspection palpation  Abdomen:  Sof,t normal bowel sounds without hepatosplenomegaly, no guarding rebound or masses no CVA tenderness MS: moves all extremities without noticeable focal  abnormality PSYCH: pleasant and cooperative, no obvious depression or anxiety ua trc leuk  ASSESSMENT AND PLAN:  Discussed the following assessment and plan:  Bladder pain - s/p rx of cephalo resistant e coli   but last rx NG  now residular ?  bladder sx repeat cx sx rx to ty and uro consult  Dysuria - Plan: POC Urinalysis Dipstick, Urine culture  Chronic headaches - ok to refill med  printer not working  so phoned in. stable at this time  Agent resistant to multiple antibiotics - e coli   bu NG on fu.   -Patient advised to return or notify health care team  if symptoms worsen or persist or new concerns arise.  Patient Instructions  reulture  If still no growth then  And pain will do urology referral.  Can try pyriduim or  cysrtex or azo.    [name]. [name] M.D.  Pre visit review using our clinic review tool, if applicable. No additional management support is needed unless otherwise documented below in the visit note.

## 2013-05-29 NOTE — Patient Instructions (Signed)
reulture  If still no growth then  And pain will do urology referral.  Can try pyriduim or  cysrtex or azo.

## 2013-05-29 NOTE — Telephone Encounter (Signed)
Noted  

## 2013-05-31 ENCOUNTER — Telehealth: Payer: Self-pay | Admitting: Internal Medicine

## 2013-05-31 LAB — URINE CULTURE: Colony Count: 4000

## 2013-05-31 NOTE — Telephone Encounter (Signed)
Pt would like ua results

## 2013-05-31 NOTE — Telephone Encounter (Signed)
Patient notified of normal urine culture.  Instructed to proceed with urology referral.

## 2013-06-04 LAB — HM MAMMOGRAPHY

## 2013-06-12 ENCOUNTER — Encounter: Payer: Self-pay | Admitting: Internal Medicine

## 2013-06-26 ENCOUNTER — Telehealth: Payer: Self-pay | Admitting: Internal Medicine

## 2013-06-26 DIAGNOSIS — E785 Hyperlipidemia, unspecified: Secondary | ICD-10-CM

## 2013-06-26 DIAGNOSIS — Z79899 Other long term (current) drug therapy: Secondary | ICD-10-CM

## 2013-06-26 DIAGNOSIS — I10 Essential (primary) hypertension: Secondary | ICD-10-CM

## 2013-06-26 MED ORDER — AMLODIPINE BESYLATE 5 MG PO TABS: 5.0000 mg | ORAL_TABLET | Freq: Every day | ORAL | Status: DC

## 2013-06-26 MED ORDER — TRIAMTERENE-HCTZ 37.5-25 MG PO TABS: 1.0000 | ORAL_TABLET | Freq: Every day | ORAL | Status: DC

## 2013-06-26 MED ORDER — CARBAMAZEPINE 200 MG PO TABS: 400.0000 mg | ORAL_TABLET | Freq: Two times a day (BID) | ORAL | Status: DC

## 2013-06-26 NOTE — Telephone Encounter (Signed)
[facility] - [place], [place] - [address] requesting new scripts of the following:  carbamazepine (TEGRETOL) 200 MG tablet triamterene-hydrochlorothiazide (MAXZIDE-25) 37.5-25 MG per tablet amLODipine (NORVASC) 5 MG tablet

## 2013-06-26 NOTE — Telephone Encounter (Signed)
[facility] - [place], [place] - [address] requesting new script for carbamazepine (TEGRETOL) 200 MG tablet #90

## 2013-06-26 NOTE — Telephone Encounter (Signed)
Sent to the pharmacy by e-scribe. 

## 2013-08-20 ENCOUNTER — Other Ambulatory Visit: Payer: Medicare Other

## 2013-08-24 ENCOUNTER — Ambulatory Visit (INDEPENDENT_AMBULATORY_CARE_PROVIDER_SITE_OTHER): Payer: Medicare Other | Admitting: Internal Medicine

## 2013-08-24 ENCOUNTER — Ambulatory Visit: Payer: Medicare Other

## 2013-08-24 VITALS — BP 138/70 | HR 85 | Temp 97.8°F | Resp 16 | Ht 65.75 in | Wt 135.2 lb

## 2013-08-24 DIAGNOSIS — R05 Cough: Secondary | ICD-10-CM

## 2013-08-24 DIAGNOSIS — J9801 Acute bronchospasm: Secondary | ICD-10-CM

## 2013-08-24 DIAGNOSIS — R630 Anorexia: Secondary | ICD-10-CM

## 2013-08-24 DIAGNOSIS — R059 Cough, unspecified: Secondary | ICD-10-CM

## 2013-08-24 MED ORDER — AZITHROMYCIN 500 MG PO TABS: 500.0000 mg | ORAL_TABLET | Freq: Every day | ORAL | Status: DC

## 2013-08-24 MED ORDER — PREDNISONE 20 MG PO TABS: ORAL_TABLET | ORAL | Status: DC

## 2013-08-24 NOTE — Progress Notes (Signed)
Subjective:   This chart was scribed for Tami Lin, MD by Forrestine Him, [facility] Scribe. This patient was seen in room 2 and the patient's care was started 3:56 PM.     Patient ID: [name], female    DOB: 04-21-1941, 73 y.o.   MRN: [id number]  Chief Complaint  Patient presents with  . Cough    2 months    HPI  HPI Comments: [name] is a 73 y.o. female with multiple medical problems who presents to [facility] complaining of an ongoing, constant cough x 2 months that is unchanged other than being more productive this week. She also reports intermittent mild sore throat and mild SOB. Pt states this pain is worsened during the day and with talking. She has tried OTC Nyquil at night time with some improvement. States she recently had the Flu, 2 mos ago, which all symptoms have resolved except for her current cough. At this time she denies any fever or difficulty swallowing. No other concerns this visit. No weight loss or night sweats. Loss of appetite for the last week. History of needing Symbicort during allergic episodes, but no history of asthma or COPD.    Patient Active Problem List   Diagnosis Date Noted  . Bladder pain 05/29/2013  . Dysuria 05/29/2013  . Agent resistant to multiple antibiotics 05/29/2013  . Low back pain radiating to right leg 01/16/2013  . Sinus problem 11/20/2012  . Chronic headaches morning 10/19/2012  . Low sodium levels  133 10/19/2012  . Leg pain, posterior 08/16/2012  . Sciatic neuritis 08/16/2012  . Sleep disturbance, unspecified 08/16/2012  . Medication management 03/24/2012  . Preventative health care 09/24/2011  . Trigeminal neuralgia 09/24/2011  . Postmenopausal HRT (hormone replacement therapy) 09/24/2011  . Polycythemia 09/20/2011  . Morning headache 02/01/2011  . Hearing aid worn   . Abnormal blood finding   . LOCALIZED SUPERFICIAL SWELLING MASS OR LUMP 09/21/2009  . DYSPNEA  05/07/2008  . HYPNIC HEADACHE 03/19/2008  . GALLSTONES 03/19/2008  . Headache 06/05/2007  . JAW PAIN 04/06/2007  . OSTEOARTHRITIS 04/06/2007  . ABNORMAL RESULT, FUNCTION STUDY, [place] 02/05/2007  . SOB 01/11/2007  . HYPERLIPIDEMIA 12/25/2006  . HYPERTENSION 12/25/2006    History   Social History  . Marital Status: Married    Spouse Name: N/A    Number of Children: 2  . Years of Education: N/A   Occupational History  .      retired Forensic psychologist   Social History Main Topics  . Smoking status: Never Smoker   . Smokeless tobacco: Not on file  . Alcohol Use: Yes     Comment: twice a week  . Drug Use: No  . Sexual Activity: Not on file   Other Topics Concern  . Not on file   Social History Narrative   Married   HH of 2-3 (god daughter)   Pets 2 dogs   Non smoker    Child is a physician   G2P2                Review of Systems  Constitutional: Negative for fever and chills.  HENT: Positive for sore throat. Negative for congestion and trouble swallowing.   Eyes: Negative for redness.  Respiratory: Positive for cough and shortness of breath.   Cardiovascular: Negative for chest pain, palpitations and leg swelling.  Gastrointestinal: Negative for abdominal pain.  Skin: Negative for rash.  Psychiatric/Behavioral:  Negative for confusion.     Objective:  Physical Exam  Nursing note and vitals reviewed. Constitutional: She is oriented to person, place, and time. She appears well-developed and well-nourished.  HENT:  Head: Normocephalic and atraumatic.  Eyes: EOM are normal.  Neck: Normal range of motion.  Cardiovascular: Normal rate.   Pulmonary/Chest: Effort normal.  Musculoskeletal: Normal range of motion.  Neurological: She is alert and oriented to person, place, and time.  Skin: Skin is warm and dry.  Psychiatric: She has a normal mood and affect. Her behavior is normal.   Triage Vitals: BP 138/70  Pulse 85  Temp(Src) 97.8 F (36.6 C) (Oral)   Resp 16  Ht 5' 5.75" (1.67 m)  Wt 135 lb 3.2 oz (61.326 kg)  BMI 21.99 kg/m2  SpO2 95%    [facility] reading (PRIMARY) by  Dr. Laney Pastor there are chronic interstitial changes bilaterally but no acute consolidation or pleural effusion and no mass lesion       Assessment & Plan:  Acute bronchospasm/Cough /reactive airway disease secondary to lower respiratory infection either viral or bacterial Loss of appetite  Meds ordered this encounter  Medications  . predniSONE (DELTASONE) 20 MG tablet    Sig: 3/3/2/2/1/1 single daily dose for 6 days    Dispense:  12 tablet    Refill:  0  . azithromycin (ZITHROMAX) 500 MG tablet    Sig: Take 1 tablet (500 mg total) by mouth daily.    Dispense:  3 tablet    Refill:  0   Followup with Dr.[name] she not resolving symptoms over the next 5 days I personally performed the services described in this documentation, which was scribed in my presence. The recorded information has been reviewed and is accurate.I have completed the patient encounter in its entirety as documented by the scribe, with editing by me where necessary. Hamed Debella P. Laney Pastor, M.D.

## 2013-08-27 ENCOUNTER — Encounter: Payer: Medicare Other | Admitting: Internal Medicine

## 2013-09-11 ENCOUNTER — Telehealth: Payer: Self-pay | Admitting: Internal Medicine

## 2013-09-11 MED ORDER — BENAZEPRIL HCL 20 MG PO TABS: 20.0000 mg | ORAL_TABLET | Freq: Every day | ORAL | Status: DC

## 2013-09-11 NOTE — Telephone Encounter (Signed)
Sent to the pharmacy by e-scribe. 

## 2013-09-11 NOTE — Telephone Encounter (Signed)
[facility] - [place], [place] - [address] requesting new script for benazepril (LOTENSIN) 20 MG tablet

## 2013-09-17 ENCOUNTER — Telehealth: Payer: Self-pay | Admitting: Internal Medicine

## 2013-09-17 MED ORDER — CARBAMAZEPINE 200 MG PO TABS: 400.0000 mg | ORAL_TABLET | Freq: Two times a day (BID) | ORAL | Status: DC

## 2013-09-17 NOTE — Telephone Encounter (Signed)
Sent to the pharmacy by e-scribe. 

## 2013-09-17 NOTE — Telephone Encounter (Signed)
[facility] - [place], [place] - [address] is requesting re-fill on carbamazepine (TEGRETOL) 200 MG tablet

## 2013-09-19 ENCOUNTER — Ambulatory Visit (INDEPENDENT_AMBULATORY_CARE_PROVIDER_SITE_OTHER): Payer: Medicare Other | Admitting: Internal Medicine

## 2013-09-19 ENCOUNTER — Encounter: Payer: Self-pay | Admitting: Internal Medicine

## 2013-09-19 VITALS — BP 122/78 | HR 65 | Temp 97.7°F | Ht 65.75 in | Wt 137.0 lb

## 2013-09-19 DIAGNOSIS — Z9181 History of falling: Secondary | ICD-10-CM

## 2013-09-19 DIAGNOSIS — R51 Headache: Secondary | ICD-10-CM

## 2013-09-19 DIAGNOSIS — G44309 Post-traumatic headache, unspecified, not intractable: Secondary | ICD-10-CM | POA: Insufficient documentation

## 2013-09-19 DIAGNOSIS — G8929 Other chronic pain: Secondary | ICD-10-CM

## 2013-09-19 DIAGNOSIS — R519 Headache, unspecified: Secondary | ICD-10-CM

## 2013-09-19 MED ORDER — AMITRIPTYLINE HCL 10 MG PO TABS: 10.0000 mg | ORAL_TABLET | Freq: Every day | ORAL | Status: DC

## 2013-09-19 NOTE — Patient Instructions (Signed)
Your neurologic exam is good and it doesn't seem like a concussion headache. Some times  Pain responds to low dose  Elavil at tnight could cause dry mouth  And constipation.but this is low dose. If  persistent or progressive consider getting imagining study   And other input from neurology .

## 2013-09-19 NOTE — Progress Notes (Signed)
Chief Complaint  Patient presents with  . Fall    Fall happened towards the end of Feb.  Headaches started 3-4 weeks ago.  Headaches start in the front of her head where she hit it.  Marland Kitchen Headache    HPI: Patient comes in today for   new problem evaluation. Was at beach  End of February Getting condo better carrying heavy bag  fell in uneven surface parking lot   And hti right  Frontal area. And had bleeding  Had big handbag  And couldn't break fall.  Wearing flats.   No loc dizziness or other sx related  but hurt badly  Healed  With lots of bruising  No new HAS  And then  About 2 - 3 weeks later noted a new type of HAs  Intermittent And sharp pain over area of impact and rdaiting and back to neck .( felt to be sttesnsion area )  And triggeres migraine.  None for 3 days.  Wakes up with it.  When it flares Sometimes middle of night and takes codiene. Which helps  No better with migraine excedrine .  3- 4 days per week.  Facial pain seens controlled with tegretol and no imbalance or  Fogginess with this medicaiton   Seen [facility] 4 4 for cough foand broncho spasm rx with x pack and pred  No current sinus sx .  ROS: See pertinent positives and negatives per HPI. denies cv nuero sx related to fall  Or current  No fever systemic sx   Past Medical History  Diagnosis Date  . GERD (gastroesophageal reflux disease)   . Hyperlipidemia   . Hypertension     echo nl lv function  mild dilitation 2009  . Migraine     hypnic HA eval by Dr. Earley Favor in the past  . Abnormal blood finding     elevated Hg and hct    . 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 Hearing aid worn   . Polycythemia   . Trigeminal neuralgia pain   . Closed head injury 02/01/2011    from syncope and had scalp laceration  neg ct .    Marland Kitchen Syncope 02/01/2011    In shower on vacation  sustained head laceration  8 sutures Had ed visit neg head ct labs and x ray   . 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 Medication side effect 09/02/2010    Poss muscle se of 10 crestor     Family History  Problem Relation Age of Onset  . Ovarian cancer Mother   . Stroke Mother   . Alcohol abuse Father   . Stroke Father   . Seizures Daughter   . Hypertension    . Diabetes Brother   . Cancer Paternal Aunt     leukemia, unknown type    History   Social History  . Marital Status: Married    Spouse Name: N/A    Number of Children: 2  . Years of Education: N/A   Occupational History  .      retired Forensic psychologist   Social History Main Topics  . Smoking status: Never Smoker   . Smokeless tobacco: None  . Alcohol Use: Yes     Comment: twice a week  . Drug Use: No  . Sexual Activity: None   Other Topics Concern  .  None   Social History Narrative   Married   HH of 2-3 (god daughter)   Pets 2 dogs   Non smoker    Child is a physician   G2P2                Outpatient Encounter Prescriptions as of 09/19/2013  Medication Sig  . albuterol (PROVENTIL HFA;VENTOLIN HFA) 108 (90 BASE) MCG/ACT inhaler Inhale 2 puffs into the lungs every 6 (six) hours as needed.  Marland Kitchen amLODipine (NORVASC) 5 MG tablet Take 1 tablet (5 mg total) by mouth daily.  . benazepril (LOTENSIN) 20 MG tablet Take 1 tablet (20 mg total) by mouth daily.  . butalbital-aspirin-caffeine-codeine (FIORINAL WITH CODEINE) 50-325-40-30 MG capsule Take 1 capsule by mouth every 4 (four) hours as needed for migraine. 1-2 by mouth every 4-6 hours as needed max of 6 tabs in 24 hours  . carbamazepine (TEGRETOL) 200 MG tablet Take 2 tablets (400 mg total) by mouth 2 (two) times daily.  . tamsulosin (FLOMAX) 0.4 MG CAPS capsule Take 0.4 mg by mouth daily.   Marland Kitchen triamterene-hydrochlorothiazide (MAXZIDE-25) 37.5-25 MG per tablet Take 1 tablet by mouth daily.  Marland Kitchen amitriptyline (ELAVIL) 10 MG tablet Take 1-2 tablets (10-20 mg total) by mouth at bedtime. For headache  . [DISCONTINUED] azithromycin (ZITHROMAX) 500 MG  tablet Take 1 tablet (500 mg total) by mouth daily.  . [DISCONTINUED] butalbital-aspirin-caffeine-codeine (FIORINAL WITH CODEINE) 50-325-40-30 MG capsule Take 1 capsule by mouth every 4 (four) hours as needed for migraine. 1-2 by mouth every 4-6 hours as needed max of 6 tabs in 24 hours  . [DISCONTINUED] predniSONE (DELTASONE) 20 MG tablet 3/3/2/2/1/1 single daily dose for 6 days    EXAM:  BP 122/78  Pulse 65  Temp(Src) 97.7 F (36.5 C) (Oral)  Ht 5' 5.75" (1.67 m)  Wt 137 lb (62.143 kg)  BMI 22.28 kg/m2  SpO2 98%  Body mass index is 22.28 kg/(m^2).  GENERAL: vitals reviewed and listed above, alert, oriented, appears well hydrated and in no acute distress HEENT:  n bruising slight prominact are a conjunctiva  clear, no obvious abnormalities on inspection of external nose and ears tms intact  OP : no lesion edema or exudate  Tongue midline face symmetrrical  NECK: no obvious masses on inspection palpation no bruits  LUNGS: clear to auscultation bilaterally, no wheezes, rales or rhonchi, good air movement CV: HRRR, no clubbing cyanosis or  peripheral edema nl cap refill  MS: moves all extremities without noticeable focal  Abnormality NEURO: oriented x 3 CN 3-12 appear intact. No focal muscle weakness or atrophy. DTRs symmetrical. Gait WNL.  Grossly non focal. No tremor or abnormal movement. rhomberg neg neg drift f to nose nl  Good balance  PSYCH: pleasant and cooperative, no obvious depression or anxiety  ASSESSMENT AND PLAN:  Discussed the following assessment and plan:  Post-traumatic headache - seems to be related to area of impact and shooting pasin her underlying mig and facial pain is stable at this time . NO other concussive sx.   Chronic headaches  Hx of fall - trip on uneven surface with carrying  large bag. in a rush Considered imaging study but no concussive sx and the type of ha seems related to impact area ? And exam is nl and no evidence of neuro dysfunction. And is 6  weeks out of event . At this time close observation trial elavil 10 - 20 at night if wishes and follow for alarm sx.  i  f.pop consider imaging study and or other interventions.  -Patient advised to return or notify health care team  if symptoms worsen ,persist or new concerns arise.  Patient Instructions  Your neurologic exam is good and it doesn't seem like a concussion headache. Some times  Pain responds to low dose  Elavil at tnight could cause dry mouth  And constipation.but this is low dose. If  persistent or progressive consider getting imagining study   And other input from neurology .    [name]. Edelmira Gallogly M.D.  Pre visit review using our clinic review tool, if applicable. No additional management support is needed unless otherwise documented below in the visit note.

## 2013-09-23 ENCOUNTER — Telehealth: Payer: Self-pay | Admitting: Internal Medicine

## 2013-09-23 DIAGNOSIS — Z79899 Other long term (current) drug therapy: Secondary | ICD-10-CM

## 2013-09-23 NOTE — Telephone Encounter (Signed)
[facility] - [place], [place] - [address] [place] is requesting re-fill on amLODipine (NORVASC) 5 MG tablet. It states the pt is on vacation and left their medication at home.

## 2013-09-24 MED ORDER — AMLODIPINE BESYLATE 5 MG PO TABS: 5.0000 mg | ORAL_TABLET | Freq: Every day | ORAL | Status: DC

## 2013-09-24 NOTE — Telephone Encounter (Signed)
Sent to the pharmacy by e-scribe. 

## 2013-10-31 ENCOUNTER — Other Ambulatory Visit: Payer: Medicare Other

## 2013-11-11 ENCOUNTER — Other Ambulatory Visit (INDEPENDENT_AMBULATORY_CARE_PROVIDER_SITE_OTHER): Payer: Medicare Other

## 2013-11-11 DIAGNOSIS — Z0000 Encounter for general adult medical examination without abnormal findings: Secondary | ICD-10-CM

## 2013-11-11 DIAGNOSIS — I10 Essential (primary) hypertension: Secondary | ICD-10-CM

## 2013-11-11 DIAGNOSIS — E785 Hyperlipidemia, unspecified: Secondary | ICD-10-CM

## 2013-11-11 LAB — LIPID PANEL
Cholesterol: 230 mg/dL — ABNORMAL HIGH (ref 0–200)
HDL: 117.7 mg/dL (ref >39.00)
LDL Cholesterol: 102 mg/dL — ABNORMAL HIGH (ref 0–99)
NonHDL: 112.3
Total CHOL/HDL Ratio: 2
Triglycerides: 51 mg/dL (ref 0.0–149.0)
VLDL: 10.2 mg/dL (ref 0.0–40.0)

## 2013-11-11 LAB — BASIC METABOLIC PANEL
BUN: 21 mg/dL (ref 6–23)
CO2: 31 mEq/L (ref 19–32)
Calcium: 9.2 mg/dL (ref 8.4–10.5)
Chloride: 98 mEq/L (ref 96–112)
Creatinine, Ser: 0.9 mg/dL (ref 0.4–1.2)
GFR: 69.43 mL/min (ref >60.00)
Glucose, Bld: 91 mg/dL (ref 70–99)
Potassium: 4.5 mEq/L (ref 3.5–5.1)
Sodium: 135 mEq/L (ref 135–145)

## 2013-11-11 LAB — CBC WITH DIFFERENTIAL/PLATELET
Basophils Absolute: 0 10*3/uL (ref 0.0–0.1)
Basophils Relative: 0.8 % (ref 0.0–3.0)
Eosinophils Absolute: 0.1 10*3/uL (ref 0.0–0.7)
Eosinophils Relative: 3.4 % (ref 0.0–5.0)
HCT: 44.1 % (ref 36.0–46.0)
Hemoglobin: 14.8 g/dL (ref 12.0–15.0)
Lymphocytes Relative: 19.7 % (ref 12.0–46.0)
Lymphs Abs: 0.8 10*3/uL (ref 0.7–4.0)
MCHC: 33.5 g/dL (ref 30.0–36.0)
MCV: 91.1 fl (ref 78.0–100.0)
Monocytes Absolute: 0.3 10*3/uL (ref 0.1–1.0)
Monocytes Relative: 7.2 % (ref 3.0–12.0)
Neutro Abs: 2.8 10*3/uL (ref 1.4–7.7)
Neutrophils Relative %: 68.9 % (ref 43.0–77.0)
Platelets: 220 10*3/uL (ref 150.0–400.0)
RBC: 4.84 Mil/uL (ref 3.87–5.11)
RDW: 13.4 % (ref 11.5–15.5)
WBC: 4.1 10*3/uL (ref 4.0–10.5)

## 2013-11-11 LAB — POCT URINALYSIS DIPSTICK
Bilirubin, UA: NEGATIVE
Blood, UA: NEGATIVE
Glucose, UA: NEGATIVE
Ketones, UA: NEGATIVE
Nitrite, UA: NEGATIVE
Protein, UA: NEGATIVE
Spec Grav, UA: 1.01
Urobilinogen, UA: 0.2
pH, UA: 7

## 2013-11-11 LAB — HEPATIC FUNCTION PANEL
ALT: 25 U/L (ref 0–35)
AST: 27 U/L (ref 0–37)
Albumin: 3.8 g/dL (ref 3.5–5.2)
Alkaline Phosphatase: 73 U/L (ref 39–117)
Bilirubin, Direct: 0 mg/dL (ref 0.0–0.3)
Total Bilirubin: 0.8 mg/dL (ref 0.2–1.2)
Total Protein: 5.9 g/dL — ABNORMAL LOW (ref 6.0–8.3)

## 2013-11-11 LAB — TSH: TSH: 0.66 u[IU]/mL (ref 0.35–4.50)

## 2013-11-13 ENCOUNTER — Encounter: Payer: Self-pay | Admitting: Internal Medicine

## 2013-11-13 ENCOUNTER — Ambulatory Visit (INDEPENDENT_AMBULATORY_CARE_PROVIDER_SITE_OTHER): Payer: Medicare Other | Admitting: Internal Medicine

## 2013-11-13 ENCOUNTER — Telehealth: Payer: Self-pay | Admitting: Internal Medicine

## 2013-11-13 VITALS — BP 142/84 | Temp 98.3°F | Ht 65.75 in | Wt 145.0 lb

## 2013-11-13 DIAGNOSIS — Z0000 Encounter for general adult medical examination without abnormal findings: Secondary | ICD-10-CM

## 2013-11-13 DIAGNOSIS — Z1211 Encounter for screening for malignant neoplasm of colon: Secondary | ICD-10-CM

## 2013-11-13 DIAGNOSIS — G4481 Hypnic headache: Secondary | ICD-10-CM

## 2013-11-13 DIAGNOSIS — Z79899 Other long term (current) drug therapy: Secondary | ICD-10-CM

## 2013-11-13 DIAGNOSIS — R51 Headache: Secondary | ICD-10-CM

## 2013-11-13 DIAGNOSIS — E785 Hyperlipidemia, unspecified: Secondary | ICD-10-CM

## 2013-11-13 DIAGNOSIS — G500 Trigeminal neuralgia: Secondary | ICD-10-CM

## 2013-11-13 DIAGNOSIS — Z23 Encounter for immunization: Secondary | ICD-10-CM

## 2013-11-13 DIAGNOSIS — I10 Essential (primary) hypertension: Secondary | ICD-10-CM

## 2013-11-13 MED ORDER — BUTALBITAL-ASA-CAFF-CODEINE 50-325-40-30 MG PO CAPS: 1.0000 | ORAL_CAPSULE | ORAL | Status: DC | PRN

## 2013-11-13 NOTE — Patient Instructions (Addendum)
Continue lifestyle intervention healthy eating and exercise . 150 minutes of exercise weeks  , weight  To healthy levels. Avoid trans fats and processed foods;  Increase fresh fruits and veges to 5 servings per day. And avoid sweet beverages  Including tea and juice. Fall avoidance .  ROV in 6 months med check lab monitoring  If on tegeretol  Will refer for colon cancer screening .  Check bp repeat 144/80.

## 2013-11-13 NOTE — Telephone Encounter (Signed)
Relevant patient education assigned to patient using Emmi. ° °

## 2013-11-13 NOTE — Progress Notes (Signed)
Pre visit review using our clinic review tool, if applicable. No additional management support is needed unless otherwise documented below in the visit note.  Chief Complaint  Patient presents with  . Annual Exam    refill fioricet    HPI: Patient comes in today for Preventive Medicare wellness visit . No major injuries, ed visits ,hospitalizations , new medications since last visit. Post trauma ha not worse need srefill fiorinal cause used it during that time [name] tried dec dose of tegretol ;pain increased so went back to orig dose . No sig se of med at this time bp has been 120/70 range  No tobacco neg ets.  Etoh: ocass. ocass  Sweet tea.   Health Maintenance  Topic Date Due  . Pneumococcal Polysaccharide Vaccine Age 10 And Over  07/14/2004  . Colonoscopy  05/23/2010  . Influenza Vaccine  12/21/2013  . Mammogram  06/05/2015  . Tetanus/tdap  09/18/2021  . Zostavax  Completed   Health Maintenance Review LIFESTYLE:  Exercise:   Tobacco/ETS:no Alcohol: per day ocass Sugar beverages:no Sleep: disturbed some  Drug use: no Bone density:  Colonoscopy: not recently over due no sx    Hearing: aids   Vision:  No limitations at present . Last eye check UTD  Safety:  Has smoke detector and wears seat belts.  No firearms. No excess sun exposure. Sees dentist regularly.  Falls: yes see last note tripped with  Carrying no dizziness or currrent significant residual  Advance directive :  Reviewed  Has one.  Memory: Felt to be good  , no concern from her or her family.  Depression: No anhedonia unusual crying or depressive symptoms  Nutrition: Eats well balanced diet; adequate calcium and vitamin D. No swallowing chewing problems.  Injury: no major injuries in the last six months.  Other healthcare providers:  Reviewed today .  Social:  Lives with spouse married   Preventive parameters: up-to-date  Reviewed   ADLS:   There are no problems or need for assistance  driving,  feeding, obtaining food, dressing, toileting and bathing, managing money using phone. She is independent.    ROS:  GEN/ HEENT: No fever, significant weight changes sweatsvision problems hearing changes, CV/ PULM; No chest pain shortness of breath cough, syncope,edema  change in exercise tolerance. GI /GU: No adominal pain, vomiting, change in bowel habits. No blood in the stool. No significant GU symptoms. SKIN/HEME: ,no acute skin rashes suspicious lesions or bleeding. No lymphadenopathy, nodules, masses.  NEURO/ PSYCH:  No neurologic signs such as weakness numbness. No depression anxiety. IMM/ Allergy: No unusual infections.  Allergy .   REST of 12 system review negative except as per HPI   Past Medical History  Diagnosis Date  . GERD (gastroesophageal reflux disease)   . Hyperlipidemia   . Hypertension     echo nl lv function  mild dilitation 2009  . Migraine     hypnic HA eval by Dr. Earley Favor in the past  . Abnormal blood finding     elevated Hg and hct    . 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 Hearing aid worn   . Polycythemia   . Trigeminal neuralgia pain   . Closed head injury 02/01/2011    from syncope and had scalp laceration  neg ct .    Marland Kitchen Syncope 02/01/2011    In shower on vacation  sustained head laceration  8 sutures Had ed visit neg head ct labs and x ray   .  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 Medication side effect 09/02/2010    Poss muscle se of 10 crestor     Family History  Problem Relation Age of Onset  . Ovarian cancer Mother   . Stroke Mother   . Alcohol abuse Father   . Stroke Father   . Seizures Daughter   . Hypertension    . Diabetes Brother   . Cancer Paternal Aunt     leukemia, unknown type    History   Social History  . Marital Status: Married    Spouse Name: N/A    Number of Children: 2  . Years of Education: N/A   Occupational History  .      retired Management consultant   Social History Main Topics  . Smoking status: Never Smoker   . Smokeless tobacco: None  . Alcohol Use: Yes     Comment: twice a week  . Drug Use: No  . Sexual Activity: None   Other Topics Concern  . None   Social History Narrative   Married   HH of 2-3 (god daughter)   Pets 2 dogs   Non smoker    Child is a physician   G2P2                Outpatient Encounter Prescriptions as of 11/13/2013  Medication Sig  . albuterol (PROVENTIL HFA;VENTOLIN HFA) 108 (90 BASE) MCG/ACT inhaler Inhale 2 puffs into the lungs every 6 (six) hours as needed.  Marland Kitchen amitriptyline (ELAVIL) 10 MG tablet Take 1-2 tablets (10-20 mg total) by mouth at bedtime. For headache  . amLODipine (NORVASC) 5 MG tablet Take 1 tablet (5 mg total) by mouth daily.  . benazepril (LOTENSIN) 20 MG tablet Take 1 tablet (20 mg total) by mouth daily.  . butalbital-aspirin-caffeine-codeine (FIORINAL WITH CODEINE) 50-325-40-30 MG capsule Take 1 capsule by mouth every 4 (four) hours as needed for migraine. 1-2 by mouth every 4-6 hours as needed max of 6 tabs in 24 hours  . carbamazepine (TEGRETOL) 200 MG tablet Take 2 tablets (400 mg total) by mouth 2 (two) times daily.  . tamsulosin (FLOMAX) 0.4 MG CAPS capsule Take 0.4 mg by mouth daily.   Marland Kitchen triamterene-hydrochlorothiazide (MAXZIDE-25) 37.5-25 MG per tablet Take 1 tablet by mouth daily.  . [DISCONTINUED] butalbital-aspirin-caffeine-codeine (FIORINAL WITH CODEINE) 50-325-40-30 MG capsule Take 1 capsule by mouth every 4 (four) hours as needed for migraine. 1-2 by mouth every 4-6 hours as needed max of 6 tabs in 24 hours    EXAM:  BP 142/84  Temp(Src) 98.3 F (36.8 C) (Oral)  Ht 5' 5.75" (1.67 m)  Wt 145 lb (65.772 kg)  BMI 23.58 kg/m2  Body mass index is 23.58 kg/(m^2).  Physical Exam: Vital signs reviewed TKP:TWSF is a well-developed well-nourished alert cooperative   who appears stated age in no acute distress.  HEENT: normocephalic atraumatic , Eyes:  PERRL EOM's full, conjunctiva clear, Nares: paten,t no deformity discharge or tenderness., Ears: no deformity EAC's clear TMs with normal landmarks. Mouth: clear OP, no lesions, edema.  Moist mucous membranes. Dentition in adequate repair. NECK: supple without masses, thyromegaly or bruits. CHEST/PULM:  Clear to auscultation and percussion breath sounds equal no wheeze , rales or rhonchi. No chest wall deformities or tenderness. CV: PMI is nondisplaced, S1 S2 no gallops, murmurs, rubs. Peripheral pulses are full without delay.No JVD .  Breast: normal by inspection . No dimpling, discharge, masses, tenderness or discharge. ABDOMEN: Bowel sounds normal nontender  No guard or rebound, no hepato splenomegal no CVA tenderness.  No hernia. Extremtities:  No clubbing cyanosis or edema, no acute joint swelling or redness no focal atrophy NEURO:  Oriented x3, cranial nerves 3-12 appear to be intact, no obvious focal weakness,gait within normal limits no abnormal reflexes or asymmetrical SKIN: No acute rashes normal turgor, color, no bruising or petechiae. Right forearm senil ecchymosis PSYCH: Oriented, good eye contact, no obvious depression anxiety, cognition and judgment appear normal. LN: no cervical axillary inguinal adenopathy No noted deficits in memory, attention, and speech.   Lab Results  Component Value Date   WBC 4.1 11/11/2013   HGB 14.8 11/11/2013   HCT 44.1 11/11/2013   PLT 220.0 11/11/2013   GLUCOSE 91 11/11/2013   CHOL 230* 11/11/2013   TRIG 51.0 11/11/2013   HDL 117.70 11/11/2013   LDLDIRECT 135.9 10/16/2012   LDLCALC 102* 11/11/2013   ALT 25 11/11/2013   AST 27 11/11/2013   NA 135 11/11/2013   K 4.5 11/11/2013   CL 98 11/11/2013   CREATININE 0.9 11/11/2013   BUN 21 11/11/2013   CO2 31 11/11/2013   TSH 0.66 11/11/2013    ASSESSMENT AND PLAN:  Discussed the following assessment and plan:  Visit for preventive health examination - refer fro colon prevnar  Unspecified essential  hypertension - up today has been controled  Other and unspecified hyperlipidemia - hihg HDL  Trigeminal neuralgia - on medication controlled  Medication management - refill fiorinal not reg use.  Hypnic headache  Need for vaccination with 13-polyvalent pneumococcal conjugate vaccine - Plan: Pneumococcal conjugate vaccine 13-valent  Special screening for malignant neoplasms, colon - Plan: Ambulatory referral to Gastroenterology  Headache Counseled. Fall prevention healthy refill meds  Risk benefit of medication discussed.  Patient Care Team: [name], MD as PCP - General Kathrynn Ducking, MD (Neurology) Cindie Crumbly (Neurosurgery)  Patient Instructions  Continue lifestyle intervention healthy eating and exercise . 150 minutes of exercise weeks  , weight  To healthy levels. Avoid trans fats and processed foods;  Increase fresh fruits and veges to 5 servings per day. And avoid sweet beverages  Including tea and juice. Fall avoidance .  ROV in 6 months med check lab monitoring  If on tegeretol  Will refer for colon cancer screening .  Check bp repeat 144/80.  [name]. Breck [place] M.D.

## 2013-11-19 ENCOUNTER — Telehealth: Payer: Self-pay | Admitting: Internal Medicine

## 2013-11-19 DIAGNOSIS — E785 Hyperlipidemia, unspecified: Secondary | ICD-10-CM

## 2013-11-19 DIAGNOSIS — Z79899 Other long term (current) drug therapy: Secondary | ICD-10-CM

## 2013-11-19 DIAGNOSIS — I10 Essential (primary) hypertension: Secondary | ICD-10-CM

## 2013-11-19 MED ORDER — TRIAMTERENE-HCTZ 37.5-25 MG PO TABS
1.0000 | ORAL_TABLET | Freq: Every day | ORAL | Status: DC
Start: 2013-11-19 — End: 2014-06-20

## 2013-11-19 NOTE — Telephone Encounter (Signed)
[facility] [place] is requesting re-fill on triamterene-hydrochlorothiazide (MAXZIDE-25) 37.5-25 MG per tablet

## 2013-11-19 NOTE — Telephone Encounter (Signed)
Sent to the pharmacy by e-scribe. 

## 2013-11-21 ENCOUNTER — Encounter: Payer: Self-pay | Admitting: Internal Medicine

## 2013-11-24 ENCOUNTER — Ambulatory Visit (INDEPENDENT_AMBULATORY_CARE_PROVIDER_SITE_OTHER): Payer: Medicare Other | Admitting: Family Medicine

## 2013-11-24 ENCOUNTER — Ambulatory Visit: Payer: Medicare Other

## 2013-11-24 VITALS — BP 118/85 | HR 72 | Temp 97.4°F | Resp 12 | Ht 65.75 in | Wt 140.0 lb

## 2013-11-24 DIAGNOSIS — S99921A Unspecified injury of right foot, initial encounter: Secondary | ICD-10-CM

## 2013-11-24 DIAGNOSIS — M25571 Pain in right ankle and joints of right foot: Secondary | ICD-10-CM

## 2013-11-24 DIAGNOSIS — M25579 Pain in unspecified ankle and joints of unspecified foot: Secondary | ICD-10-CM

## 2013-11-24 DIAGNOSIS — S8990XA Unspecified injury of unspecified lower leg, initial encounter: Secondary | ICD-10-CM

## 2013-11-24 DIAGNOSIS — S93409A Sprain of unspecified ligament of unspecified ankle, initial encounter: Secondary | ICD-10-CM

## 2013-11-24 DIAGNOSIS — S93401A Sprain of unspecified ligament of right ankle, initial encounter: Secondary | ICD-10-CM

## 2013-11-24 DIAGNOSIS — S99919A Unspecified injury of unspecified ankle, initial encounter: Secondary | ICD-10-CM

## 2013-11-24 DIAGNOSIS — S99929A Unspecified injury of unspecified foot, initial encounter: Secondary | ICD-10-CM

## 2013-11-24 NOTE — Patient Instructions (Addendum)
RICE:  R: Rest is achieved by limiting weightbearing. Use crutches until you are able to walk with a normal gait. I:   Ice or cold water immersion for 15 to 20 minutes every two to three hours for the first 48 hours or until swelling is improved, whichever comes first. C: Compression with an elastic bandage to minimize swelling should be applied early. E: Elevation - The injured ankle should be kept elevated above the level of the heart to further alleviate swelling.  Nonsteroidal antiinflammatory drugs (NSAIDs) can be used; no particular NSAID has been shown to be superior so you can using whatever you have at home - ibuprofen, aleve, motrin, advil, etc.    Exercises including pointing and flexing the foot and doing foot circles should be started early, once acute pain and swelling subside, to maintain range of motion. The intensity of rehabilitation is increased gradually.  Ankle splints or braces can limit extremes of joint motion and allow early weightbearing while protecting against reinjury.    Ankle Sprain An ankle sprain is an injury to the strong, fibrous tissues (ligaments) that hold the bones of your ankle joint together.  CAUSES An ankle sprain is usually caused by a fall or by twisting your ankle. Ankle sprains most commonly occur when you step on the outer edge of your foot, and your ankle turns inward. People who participate in sports are more prone to these types of injuries.  SYMPTOMS   Pain in your ankle. The pain may be present at rest or only when you are trying to stand or walk.  Swelling.  Bruising. Bruising may develop immediately or within 1 to 2 days after your injury.  Difficulty standing or walking, particularly when turning corners or changing directions. DIAGNOSIS  Your caregiver will ask you details about your injury and perform a physical exam of your ankle to determine if you have an ankle sprain. During the physical exam, your caregiver will press on and  apply pressure to specific areas of your foot and ankle. Your caregiver will try to move your ankle in certain ways. An X-ray exam may be done to be sure a bone was not broken or a ligament did not separate from one of the bones in your ankle (avulsion fracture).  TREATMENT  Certain types of braces can help stabilize your ankle. Your caregiver can make a recommendation for this. Your caregiver may recommend the use of medicine for pain. If your sprain is severe, your caregiver may refer you to a surgeon who helps to restore function to parts of your skeletal system (orthopedist) or a physical therapist. [place] ice to your injury for 1-2 days or as directed by your caregiver. Applying ice helps to reduce inflammation and pain.  Put ice in a plastic bag.  Place a towel between your skin and the bag.  Leave the ice on for 15-20 minutes at a time, every 2 hours while you are awake.  Only take over-the-counter or prescription medicines for pain, discomfort, or fever as directed by your caregiver.  Elevate your injured ankle above the level of your heart as much as possible for 2-3 days.  If your caregiver recommends crutches, use them as instructed. Gradually put weight on the affected ankle. Continue to use crutches or a cane until you can walk without feeling pain in your ankle.  If you have a plaster splint, wear the splint as directed by your caregiver. Do not rest it on  anything harder than a pillow for the first 24 hours. Do not put weight on it. Do not get it wet. You may take it off to take a shower or bath.  You may have been given an elastic bandage to wear around your ankle to provide support. If the elastic bandage is too tight (you have numbness or tingling in your foot or your foot becomes cold and blue), adjust the bandage to make it comfortable.  If you have an air splint, you may blow more air into it or let air out to make it more comfortable. You may take  your splint off at night and before taking a shower or bath. Wiggle your toes in the splint several times per day to decrease swelling. SEEK MEDICAL CARE IF:   You have rapidly increasing bruising or swelling.  Your toes feel extremely cold or you lose feeling in your foot.  Your pain is not relieved with medicine. SEEK IMMEDIATE MEDICAL CARE IF:  Your toes are numb or blue.  You have severe pain that is increasing. MAKE SURE YOU:   Understand these instructions.  Will watch your condition.  Will get help right away if you are not doing well or get worse. Document Released: 05/09/2005 Document Revised: 02/01/2012 Document Reviewed: 05/21/2011 [facility] Patient Information 2015 [place], [place]. This information is not intended to replace advice given to you by your health care provider. Make sure you discuss any questions you have with your health care provider.

## 2013-11-24 NOTE — Progress Notes (Signed)
Subjective:    Patient ID: [name], female    DOB: 1941/05/05, 73 y.o.   MRN: [id number] Chief Complaint  Patient presents with  . Foot Injury    1 wk ago turned R/foot - painful - iced;   3 days ago dropped lg box frozen veges on R/foot  . Foot Pain    pain/swelling lateral metatarsal area    HPI  Had inversion inj to Rt foot 1 wk prev with pain over lateral malleolus. Then 3d later dropped a large box of frozen veggies onto Rt foot with diffuse bruising all over entire foot and swelling over lateral forefoot. Daughter is a ER dr near [place] - told her to go for eval as could be ballerina frx but didn't have a chance to eval pt. Legs are always ttp Was able to walk immed after injury and now - just limps a little but gets better as she walks further. Has been going freq icing and elevation, taking a few aleve/motrin.  Past Medical History  Diagnosis Date  . GERD (gastroesophageal reflux disease)   . Hyperlipidemia   . Hypertension     echo nl lv function  mild dilitation 2009  . Migraine     hypnic HA eval by Dr. Earley Favor in the past  . Abnormal blood finding     elevated Hg and hct    . 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 Hearing aid worn   . Polycythemia   . Trigeminal neuralgia pain   . Closed head injury 02/01/2011    from syncope and had scalp laceration  neg ct .    Marland Kitchen Syncope 02/01/2011    In shower on vacation  sustained head laceration  8 sutures Had ed visit neg head ct labs and x ray   . 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 Medication side effect 09/02/2010    Poss muscle se of 10 crestor   denies h/o of cool, mottled, or colored ext or Raynauds.  Current Outpatient Prescriptions on File Prior to Visit  Medication Sig Dispense Refill  . albuterol (PROVENTIL HFA;VENTOLIN HFA) 108 (90 BASE) MCG/ACT inhaler Inhale 2 puffs into the lungs every 6 (six) hours as needed.  3 Inhaler  0    . amLODipine (NORVASC) 5 MG tablet Take 1 tablet (5 mg total) by mouth daily.  30 tablet  0  . benazepril (LOTENSIN) 20 MG tablet Take 1 tablet (20 mg total) by mouth daily.  90 tablet  0  . butalbital-aspirin-caffeine-codeine (FIORINAL WITH CODEINE) 50-325-40-30 MG capsule Take 1 capsule by mouth every 4 (four) hours as needed for migraine. 1-2 by mouth every 4-6 hours as needed max of 6 tabs in 24 hours  30 capsule  0  . carbamazepine (TEGRETOL) 200 MG tablet Take 2 tablets (400 mg total) by mouth 2 (two) times daily.  360 tablet  0  . tamsulosin (FLOMAX) 0.4 MG CAPS capsule Take 0.4 mg by mouth daily.       Marland Kitchen triamterene-hydrochlorothiazide (MAXZIDE-25) 37.5-25 MG per tablet Take 1 tablet by mouth daily.  90 tablet  3  . amitriptyline (ELAVIL) 10 MG tablet Take 1-2 tablets (10-20 mg total) by mouth at bedtime. For headache  60 tablet  2   No current facility-administered medications on file prior to visit.   Allergies  Allergen Reactions  . Sulfamethoxazole-Trimethoprim  REACTION: unspecified     Review of Systems  Constitutional: Positive for activity change. Negative for fever, chills and unexpected weight change.  Musculoskeletal: Positive for arthralgias, gait problem, joint swelling and myalgias. Negative for back pain.  Skin: Positive for color change. Negative for pallor, rash and wound.  Neurological: Negative for weakness and numbness.  Hematological: Negative for adenopathy. Bruises/bleeds easily.      BP 118/85  Pulse 72  Temp(Src) 97.4 F (36.3 C) (Oral)  Resp 12  Ht 5' 5.75" (1.67 m)  Wt 140 lb (63.504 kg)  BMI 22.77 kg/m2  SpO2 99% Objective:   Physical Exam  Constitutional: She is oriented to person, place, and time. She appears well-developed and well-nourished. No distress.  HENT:  Head: Normocephalic and atraumatic.  Right Ear: External ear normal.  Eyes: Conjunctivae are normal. No scleral icterus.  Cardiovascular:  Pulses:      Dorsalis pedis  pulses are 2+ on the right side.       Posterior tibial pulses are 2+ on the right side.  All toes on both feet red to purple and mildly cool w/ cap refill <2 sec, hands nml - mildly cool Trace pitting edema bilaterally  Pulmonary/Chest: Effort normal.  Musculoskeletal:       Right ankle: She exhibits decreased range of motion, swelling and ecchymosis. She exhibits no deformity, no laceration and normal pulse. Tenderness. Lateral malleolus, AITFL, CF ligament and posterior TFL tenderness found. No medial malleolus, no head of 5th metatarsal and no proximal fibula tenderness found.       Right foot: She exhibits decreased range of motion, tenderness, bony tenderness and swelling. She exhibits normal capillary refill, no crepitus and no laceration.       Feet:  Focal area of swelling over prox 3rd and 4th metatarsals Bruising from ant ankle CF ligament extending down into all toes on volar aspect.  Neurological: She is alert and oriented to person, place, and time. She has normal strength. She displays no atrophy and no tremor. No sensory deficit. She exhibits normal muscle tone. Gait abnormal. Coordination normal.  Antalgic gait  Skin: Skin is warm and dry. She is not diaphoretic. No erythema.  Psychiatric: She has a normal mood and affect. Her behavior is normal.  Pt's pain was difficult to fully appreciate and exam difficult to determine as she was sig ttp over both lower ext which she reports is normal for her.    [facility] reading (PRIMARY) by  Dr. Brigitte Pulse. Right ankle strain: No acute bony abnormality Right foot strain:  No acute bony abnormality.  EXAM: RIGHT ANKLE - COMPLETE 3+ VIEW  COMPARISON: None.  FINDINGS: There is no evidence of fracture, dislocation, or joint effusion. There is no evidence of arthropathy or other focal bone abnormality. Soft tissues are unremarkable. Area along the lateral periphery the distal tibia shaft likely representing a benign  fibro-osseous lesion.  IMPRESSION: No acute osseus abnormalities.  EXAM: RIGHT FOOT COMPLETE - 3+ VIEW  COMPARISON: None.  FINDINGS: Dorsal soft tissue swelling overlying the metatarsals. No evidence of acute fracture or dislocation. Joint spaces well preserved. Well-preserved bone mineral density. No intrinsic osseous abnormalities.  IMPRESSION: No osseous abnormality.   Assessment & Plan:  Foot injury, right, initial encounter - Plan: DG Ankle Complete Right, DG Foot Complete Right  Pain in joint, ankle and foot, right - Plan: DG Ankle Complete Right, DG Foot Complete Right  Moderate right ankle sprain, initial encounter Attempted gel splint but would not fit around  calf due to swelling so placed in short cam boot to help provide stability during healing. Warned of concern for causing knee/hip/back pain due to heaviness of boot so d/c use and RTC if any other joint pain begins. Recheck in 5d prior to [facility] vacation -  Hopefully will be healed enough to transition out of boot at that time. RICE and gentle non-weightbearing ROM exercises reviewed. Prn nsaids for pain.  Delman Cheadle, MD MPH

## 2013-11-25 NOTE — Progress Notes (Signed)
Pt will see Dr. Brigitte Pulse at the walk-in 102 on 7/9.

## 2013-11-28 ENCOUNTER — Ambulatory Visit (INDEPENDENT_AMBULATORY_CARE_PROVIDER_SITE_OTHER): Payer: Medicare Other | Admitting: Family Medicine

## 2013-11-28 VITALS — BP 118/84 | HR 65 | Temp 97.4°F | Resp 18 | Ht 66.75 in | Wt 141.5 lb

## 2013-11-28 DIAGNOSIS — Z5189 Encounter for other specified aftercare: Secondary | ICD-10-CM

## 2013-11-28 DIAGNOSIS — S93601D Unspecified sprain of right foot, subsequent encounter: Secondary | ICD-10-CM

## 2013-11-28 DIAGNOSIS — S93609A Unspecified sprain of unspecified foot, initial encounter: Secondary | ICD-10-CM

## 2013-11-28 NOTE — Progress Notes (Signed)
Subjective:    Patient ID: [name], female    DOB: 03-06-1941, 73 y.o.   MRN: [id number]  HPI Chief Complaint  Patient presents with  . Follow-up    R Foot     This chart was scribed for Delman Cheadle, MD by Thea Alken, ED Scribe. This patient was seen in room 3 and the patient's care was started at 9:16 AM.  HPI Comments: [name] is a 73 y.o. female who presents to the [facility] here for a f/u regarding a right ankle sprain that took place 1 week ago after dropping a frozen box of veggies on right foot. Pt states she has been doing well but does have difficulty ambulating with boot. She reports swelling has improved but reports pain around lump on foot. Pt has been icing foot and taking ibuprofen as needed. She denies knee pain, hip pain, and back pain.    Past Medical History  Diagnosis Date  . GERD (gastroesophageal reflux disease)   . Hyperlipidemia   . Hypertension     echo nl lv function  mild dilitation 2009  . Migraine     hypnic HA eval by Dr. Earley Favor in the past  . Abnormal blood finding     elevated Hg and hct    . 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 Hearing aid worn   . Polycythemia   . Trigeminal neuralgia pain   . Closed head injury 02/01/2011    from syncope and had scalp laceration  neg ct .    Marland Kitchen Syncope 02/01/2011    In shower on vacation  sustained head laceration  8 sutures Had ed visit neg head ct labs and x ray   . 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 Medication side effect 09/02/2010    Poss muscle se of 10 crestor    Past Surgical History  Procedure Laterality Date  . Doppler echocardiography  2009    nl lv function mild lv dilitation  . Abdominal hysterectomy  2002    tubal  . Cardiac catheterization  2000    chest pains neg  . Rt shoulder surgery    . Oophorectomy    . Appendectomy    . Craniotomy  July 19 13    nerve decompression  right trigeminal   . Brain surgery     Allergies  Allergen Reactions  . Sulfamethoxazole-Trimethoprim     REACTION: unspecified   Prior to Admission medications   Medication Sig Start Date End Date Taking? Authorizing Provider  albuterol (PROVENTIL HFA;VENTOLIN HFA) 108 (90 BASE) MCG/ACT inhaler Inhale 2 puffs into the lungs every 6 (six) hours as needed. 09/01/10  Yes [name], MD  amLODipine (NORVASC) 5 MG tablet Take 1 tablet (5 mg total) by mouth daily. 09/24/13  Yes [name], MD  benazepril (LOTENSIN) 20 MG tablet Take 1 tablet (20 mg total) by mouth daily. 09/11/13  Yes [name], MD  butalbital-aspirin-caffeine-codeine [facility] WITH CODEINE) [phone] MG capsule Take 1 capsule by mouth every 4 (four) hours as needed for migraine. 1-2 by mouth every 4-6 hours as needed max of 6 tabs in 24 hours 11/13/13  Yes [name], MD  carbamazepine (TEGRETOL) 200 MG tablet Take 2 tablets (400 mg total) by mouth 2 (two) times daily. 09/17/13  Yes [name],  MD  tamsulosin (FLOMAX) 0.4 MG CAPS capsule Take 0.4 mg by mouth daily.  08/18/13  Yes Historical Provider, MD  triamterene-hydrochlorothiazide (MAXZIDE-25) 37.5-25 MG per tablet Take 1 tablet by mouth daily. 11/19/13  Yes [name], MD  amitriptyline (ELAVIL) 10 MG tablet Take 1-2 tablets (10-20 mg total) by mouth at bedtime. For headache 09/19/13   [name], MD   History   Social History  . Marital Status: Married    Spouse Name: N/A    Number of Children: 2  . Years of Education: N/A   Occupational History  .      retired Forensic psychologist   Social History Main Topics  . Smoking status: Never Smoker   . Smokeless tobacco: Never Used  . Alcohol Use: Yes     Comment: twice a week  . Drug Use: No  . Sexual Activity: Not on file   Other Topics Concern  . Not on file   Social History Narrative   Married   HH of 2-3 (god daughter)   Pets 2 dogs   Non smoker    Child is a physician   G2P2                 Review of Systems  Constitutional: Negative for fever and chills.  Musculoskeletal: Positive for arthralgias, gait problem and myalgias. Negative for back pain.      Objective:  BP 118/84  Pulse 65  Temp(Src) 97.4 F (36.3 C) (Oral)  Resp 18  Ht 5' 6.75" (1.695 m)  Wt 141 lb 8 oz (64.184 kg)  BMI 22.34 kg/m2  SpO2 97%  Physical Exam  Nursing note and vitals reviewed. Constitutional: She is oriented to person, place, and time. She appears well-developed and well-nourished.  Non-toxic appearance. She does not have a sickly appearance. She does not appear ill. No distress.  HENT:  Head: Normocephalic and atraumatic.  Eyes: Conjunctivae and EOM are normal.  Neck: Normal range of motion. Neck supple.  Cardiovascular: Normal rate.   Pulmonary/Chest: Effort normal. No respiratory distress.  Musculoskeletal: Normal range of motion.  Severe pain on metatarsals 3-4 mostly on 4th with severe swelling over lateral tarsal over lateral aspect of foot. Mild ecchymosis on 1st-4th toes significant from prior  2+ dorsalis pedis pulses 5/5 plantar and dorsal flexion  Normal ankle stability with inversion and eversion.   Neurological: She is alert and oriented to person, place, and time.  Skin: Skin is warm and dry. She is not diaphoretic.  Psychiatric: She has a normal mood and affect. Her behavior is normal.      Assessment & Plan:   9:24 AM- Pt advised of plan for treatment including a post-op shoe for easier ambulation around beach and to continue icing and ibuprofen as needed. Pt advised to be seen by a podiatrist if right foot pain worsen. Foot sprain, right, subsequent encounter  No orders of the defined types were placed in this encounter.    I personally performed the services described in this documentation, which was scribed in my presence. The recorded information has been reviewed and considered, and addended by me as needed.  Delman Cheadle, MD MPH

## 2013-11-28 NOTE — Patient Instructions (Signed)
Due to the severity of swelling and pain over the closer end (proximal head) of the 3rd-5th metatarsals - esp 4th - I suspect that you have a bone bruise (which is "micro-fractures'").  Therefore, if you do not have significant improvement in your sytmptoms over the next several days, I would recommend referral to podiatry and consider either repeating xrays or obtaining MRI depending on the severity of your symptoms and improvement. Continue ice and elevation whenever possible.  You can call here or to Dr. Regis Bill for any further referrals or management. We can easily work together since we are all part of the same [facility] and records.  Foot Sprain The muscles and cord like structures which attach muscle to bone (tendons) that surround the feet are made up of units. A foot sprain can occur at the weakest spot in any of these units. This condition is most often caused by injury to or overuse of the foot, as from playing contact sports, or aggravating a previous injury, or from poor conditioning, or obesity. SYMPTOMS  Pain with movement of the foot.  Tenderness and swelling at the injury site.  Loss of strength is present in moderate or severe sprains. THE THREE GRADES OR SEVERITY OF FOOT SPRAIN ARE:  Mild (Grade I): Slightly pulled muscle without tearing of muscle or tendon fibers or loss of strength.  Moderate (Grade II): Tearing of fibers in a muscle, tendon, or at the attachment to bone, with small decrease in strength.  Severe (Grade III): Rupture of the muscle-tendon-bone attachment, with separation of fibers. Severe sprain requires surgical repair. Often repeating (chronic) sprains are caused by overuse. Sudden (acute) sprains are caused by direct injury or over-use. DIAGNOSIS  Diagnosis of this condition is usually by your own observation. If problems continue, a caregiver may be required for further evaluation and treatment. X-rays may be required to make sure there  are not breaks in the bones (fractures) present. Continued problems may require physical therapy for treatment. PREVENTION  Use strength and conditioning exercises appropriate for your sport.  Warm up properly prior to working out.  Use athletic shoes that are made for the sport you are participating in.  Allow adequate time for healing. Early return to activities makes repeat injury more likely, and can lead to an unstable arthritic foot that can result in prolonged disability. Mild sprains generally heal in 3 to 10 days, with moderate and severe sprains taking 2 to 10 weeks. Your caregiver can help you determine the proper time required for healing. HOME CARE INSTRUCTIONS   Apply ice to the injury for 15-20 minutes, 03-04 times per day. Put the ice in a plastic bag and place a towel between the bag of ice and your skin.  An elastic wrap (like an Ace bandage) may be used to keep swelling down.  Keep foot above the level of the heart, or at least raised on a footstool, when swelling and pain are present.  Try to avoid use other than gentle range of motion while the foot is painful. Do not resume use until instructed by your caregiver. Then begin use gradually, not increasing use to the point of pain. If pain does develop, decrease use and continue the above measures, gradually increasing activities that do not cause discomfort, until you gradually achieve normal use.  Use crutches if and as instructed, and for the length of time instructed.  Keep injured foot and ankle wrapped between treatments.  Massage foot and ankle  for comfort and to keep swelling down. Massage from the toes up towards the knee.  Only take over-the-counter or prescription medicines for pain, discomfort, or fever as directed by your caregiver. SEEK IMMEDIATE MEDICAL CARE IF:   Your pain and swelling increase, or pain is not controlled with medications.  You have loss of feeling in your foot or your foot turns cold  or blue.  You develop new, unexplained symptoms, or an increase of the symptoms that brought you to your caregiver. MAKE SURE YOU:   Understand these instructions.  Will watch your condition.  Will get help right away if you are not doing well or get worse. Document Released: 10/29/2001 Document Revised: 08/01/2011 Document Reviewed: 12/27/2007 [facility] Patient Information 2015 [place], [place]. This information is not intended to replace advice given to you by your health care provider. Make sure you discuss any questions you have with your health care provider.  Periosteal Hematoma Periosteal hematoma (bone bruise) is a localized, tender, raised area close to the bone. It can occur from a small hidden fracture of the bone, following surgery, or from other trauma to the area. It typically occurs in bones located close to the surface of the skin, such as the shin, knee, and heel bone. Although it may take 2 or more weeks to completely heal, bone bruises typically are not associated with permanent or serious damage to the bone. If you are taking blood thinners, you may be at greater risk for such injuries.  CAUSES  A bone bruise is usually caused by high-impact trauma to the bone, but it can be caused by sports injuries or twisting injuries. SIGNS AND SYMPTOMS   Severe pain around the injured area that typically lasts longer than a normal bruise.  Difficulty using the bruised area.  Tender, raised area close to the bone.  Discoloration or swelling of the bruised area. DIAGNOSIS  You may need an MRI of the injured area to confirm a bone bruise if your health care provider feels it is necessary. A regular X-ray will not detect a bone bruise, but it will detect a broken bone (fracture). An X-ray may be taken to rule out any fractures. TREATMENT  Often, the best treatment for a bone bruise is resting, icing, and applying cold compresses to the injured area. Over-the-counter medicines may also  be recommended for pain control. HOME CARE INSTRUCTIONS  Some things you can do to improve the condition are:   Rest and elevate the area of injury as long as it is very tender or swollen.  Apply ice to the injured area:  Put ice in a plastic bag.  Place a towel between your skin and the bag.  Leave the ice on for 20 minutes, 2-3 times a day.  Use an elastic wrap to reduce swelling and protect the injured area. Make sure it is not applied too tightly. If the area around the wrap becomes cold or blue, the wrap is too tight. Wrap it more loosely.  For activity:  Follow your health care provider's instructions about whether walking with crutches is required. This will depend on how serious your condition is.  Start weight bearing gradually on the bruised part.  Continue to use crutches or a cane until you can stand without causing pain, or as instructed.  If a plaster splint was applied:  Wear the splint until you are seen for a follow-up exam.  Rest it on nothing harder than a pillow the first 24 hours.  Do not put weight on it.  Do not get it wet. You may take it off to take a shower or bath.  You may have been given an elastic bandage to use with or without the plaster splint. The splint is too tight if you have numbness or tingling, or if the skin around the bandage becomes cold and blue. Adjust the bandage to make it comfortable.  If an air splint was applied:  You may alter the amount of air in the splint as needed for comfort.  You may take it off at night and to take a shower or bath.  If the injury was in either leg, wiggle your toes in the splint several times per day if you are able.  Only take over-the-counter or prescription medicines for pain, discomfort, or fever as directed by your health care provider.  Keep all follow-up visits with your health care provider. This includes any orthopedic referrals, physical therapy, and rehabilitation. Any delay in getting  necessary care could result in a delay or failure of the bones to heal. SEEK MEDICAL CARE IF:   You have an increase in bruising, swelling, tenderness, heat, or pain over your injury.  You notice coldness of your toes that does not improve after removing a splint or bandage.  Your pain is not lessened after you take medicine.  You have increased difficulty bearing weight on the injured leg, if the injury is in either leg. SEEK IMMEDIATE MEDICAL CARE IF:   You have severe pain near the injured area or severe pain with stretching.  You have increased swelling that resulted in a tense, hard area or a loss of sensation in the area of the injury.  You have pale, cool skin below the area of the injury (in an extremity) that does not go away after removing a splint or bandage. MAKE SURE YOU:   Understand these instructions.  Will watch your condition.  Will get help right away if you are not doing well or get worse. Document Released: 06/16/2004 Document Revised: 02/27/2013 Document Reviewed: 10/26/2012 [facility] Patient Information 2015 [place], [place]. This information is not intended to replace advice given to you by your health care provider. Make sure you discuss any questions you have with your health care provider.

## 2013-12-16 ENCOUNTER — Ambulatory Visit (INDEPENDENT_AMBULATORY_CARE_PROVIDER_SITE_OTHER): Payer: Medicare Other

## 2013-12-16 ENCOUNTER — Ambulatory Visit (INDEPENDENT_AMBULATORY_CARE_PROVIDER_SITE_OTHER): Payer: Medicare Other | Admitting: Family Medicine

## 2013-12-16 VITALS — BP 130/90 | HR 68 | Temp 97.7°F | Resp 16 | Ht 66.75 in | Wt 141.4 lb

## 2013-12-16 DIAGNOSIS — M795 Residual foreign body in soft tissue: Secondary | ICD-10-CM

## 2013-12-16 DIAGNOSIS — Z5189 Encounter for other specified aftercare: Secondary | ICD-10-CM

## 2013-12-16 DIAGNOSIS — S93601D Unspecified sprain of right foot, subsequent encounter: Secondary | ICD-10-CM

## 2013-12-16 DIAGNOSIS — S93609A Unspecified sprain of unspecified foot, initial encounter: Secondary | ICD-10-CM

## 2013-12-16 NOTE — Progress Notes (Addendum)
Subjective:  This chart was scribed for Delman Cheadle, MD by Randa Evens, ED Scribe. This Patient was seen in room 10 and the patients care was started at 6:25 PM   Patient ID: [name], female    DOB: 1941/01/08, 73 y.o.   MRN: [id number]  Chief Complaint  Patient presents with  . Follow-up    follow up on foot injury with Dr. Brigitte Pulse.  pt feels that her foot is getting better.    HPI  Seen 3 weeks previously when she suffered inversion injury to right lateral malleolus, then several days later dropped large box of vegetables on same area. X rays negative. Initially put in short cam walker seen several days later with significant improvement transferred to post op shoe. She still has some associated arthralgias and joint swelling though improving. She is not having any pain over her ankle but still with some pain in her lateral foot and still w/ a golf-ball size area of swelling over foot.  Is still wearing post-op shoe. She denies gait problem or myalgias.     Also complaining of a splinter in the bottom of her left foot that she cant remove. She has an associated wound to this area from trying to remove the splinter her self. Was helping move a mirror and it shattered.  Leaving for a trip to [place] at the end of next wk, then will have some time w/ her grandchildren so really hoping she gets back to normal in the next 10d.   Past Medical History  Diagnosis Date  . GERD (gastroesophageal reflux disease)   . Hyperlipidemia   . Hypertension     echo nl lv function  mild dilitation 2009  . Migraine     hypnic HA eval by Dr. Earley Favor in the past  . Abnormal blood finding     elevated Hg and hct    . 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 Hearing aid worn   . Polycythemia   . Trigeminal neuralgia pain   . Closed head injury 02/01/2011    from syncope and had scalp laceration  neg ct .    Marland Kitchen Syncope 02/01/2011    In shower on vacation  sustained head laceration  8 sutures Had ed visit  neg head ct labs and x ray   . 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 Medication side effect 09/02/2010    Poss muscle se of 10 crestor    Current Outpatient Prescriptions on File Prior to Visit  Medication Sig Dispense Refill  . albuterol (PROVENTIL HFA;VENTOLIN HFA) 108 (90 BASE) MCG/ACT inhaler Inhale 2 puffs into the lungs every 6 (six) hours as needed.  3 Inhaler  0  . amLODipine (NORVASC) 5 MG tablet Take 1 tablet (5 mg total) by mouth daily.  30 tablet  0  . benazepril (LOTENSIN) 20 MG tablet Take 1 tablet (20 mg total) by mouth daily.  90 tablet  0  . butalbital-aspirin-caffeine-codeine (FIORINAL WITH CODEINE) 50-325-40-30 MG capsule Take 1 capsule by mouth every 4 (four) hours as needed for migraine. 1-2 by mouth every 4-6 hours as needed max of 6 tabs in 24 hours  30 capsule  0  . carbamazepine (TEGRETOL) 200 MG tablet Take 2 tablets (400 mg total) by mouth 2 (two) times daily.  360 tablet  0  . tamsulosin (FLOMAX) 0.4 MG  CAPS capsule Take 0.4 mg by mouth daily.       Marland Kitchen triamterene-hydrochlorothiazide (MAXZIDE-25) 37.5-25 MG per tablet Take 1 tablet by mouth daily.  90 tablet  3   No current facility-administered medications on file prior to visit.   Allergies  Allergen Reactions  . Sulfamethoxazole-Trimethoprim     REACTION: unspecified    Review of Systems  Musculoskeletal: Positive for arthralgias and joint swelling. Negative for gait problem and myalgias.  Skin: Positive for wound. Negative for color change and rash.  Neurological: Negative for weakness and numbness.     Objective:  BP 130/90  Pulse 68  Temp(Src) 97.7 F (36.5 C) (Oral)  Resp 16  Ht 5' 6.75" (1.695 m)  Wt 141 lb 6.4 oz (64.139 kg)  BMI 22.32 kg/m2  SpO2 99%   Physical Exam  Nursing note and vitals reviewed. Constitutional: She is oriented to person, place, and time. She appears well-developed and well-nourished. No  distress.  HENT:  Head: Normocephalic and atraumatic.  Eyes: Conjunctivae and EOM are normal.  Neck: Neck supple. No tracheal deviation present.  Cardiovascular: Normal rate.   Pulses:      Dorsalis pedis pulses are 2+ on the right side, and 2+ on the left side.       Posterior tibial pulses are 2+ on the right side, and 2+ on the left side.  Pulmonary/Chest: Effort normal. No respiratory distress.  Musculoskeletal: Normal range of motion.  Pain over latera tarsal head of 5th tarsal and pain over aitf ligament, well circumscribed subcutaneous swelling approximately  2cm diameter of lateral tarsals  Neurological: She is alert and oriented to person, place, and time.  Skin: Skin is warm and dry.  Psychiatric: She has a normal mood and affect. Her behavior is normal.   Primary x-ray reading by Dr.Emersynn Deatley. Right foot normal.   Assessment & Plan:  Right foot sprain, subsequent encounter - Plan: DG Foot Complete Right Pt is 1 mo out from injury and still with sig point tenderness as well as large amount of focal swelling - poss developed a ganglion cyst? Rec referral to podiatry for further eval.  Foreign body (FB) in soft tissue - glass in foot - removed by Murrell Redden w/ forceps alone   I personally performed the services described in this documentation, which was scribed in my presence. The recorded information has been reviewed and considered, and addended by me as needed.  Delman Cheadle, MD MPH

## 2013-12-25 ENCOUNTER — Encounter: Payer: Self-pay | Admitting: Podiatry

## 2013-12-25 ENCOUNTER — Ambulatory Visit (INDEPENDENT_AMBULATORY_CARE_PROVIDER_SITE_OTHER): Payer: Medicare Other

## 2013-12-25 ENCOUNTER — Ambulatory Visit (INDEPENDENT_AMBULATORY_CARE_PROVIDER_SITE_OTHER): Payer: Medicare Other | Admitting: Podiatry

## 2013-12-25 VITALS — BP 140/83 | HR 76 | Resp 16 | Ht 66.0 in | Wt 140.0 lb

## 2013-12-25 DIAGNOSIS — R52 Pain, unspecified: Secondary | ICD-10-CM

## 2013-12-25 DIAGNOSIS — M6740 Ganglion, unspecified site: Secondary | ICD-10-CM

## 2013-12-25 NOTE — Progress Notes (Signed)
Subjective:     Patient ID: [name], female   DOB: 21-Feb-1941, 73 y.o.   MRN: [id number]  Foot Pain   patient presents stating that she hurt her right foot about one month ago and it's developed swelling and pain and she's going out of the country in the next several days and it hard for her to wear shoes   Review of Systems  All other systems reviewed and are negative.      Objective:   Physical Exam  Nursing note and vitals reviewed. Constitutional: She is oriented to person, place, and time.  Cardiovascular: Intact distal pulses.   Musculoskeletal: Normal range of motion.  Neurological: She is oriented to person, place, and time.  Skin: Skin is warm.   neurovascular status intact with muscle strength adequate and range of motion of the subtalar midtarsal joint mildly diminished. Patient is found to have well-perfused digits mild depression of the arch upon weightbearing and noted to have a edematous area on the lateral side of the right dorsal foot that's painful when pressed with what appears to be freely movable cyst within subcutaneous tissue.     Assessment:     Probable ganglionic cyst or possible hematoma dorsal lateral right    Plan:     H&P and x-rays reviewed. Today I did a proximal nerve block of the area I then aspirated was able to get out and fluid up which some was bloody and some appear to be more gelatinous in it's appearance and then injected a small amount steroid to shrink and applied compression. Patient will be seen back to recur

## 2013-12-25 NOTE — Progress Notes (Signed)
   Subjective:    Patient ID: [name], female    DOB: 1941/03/14, 73 y.o.   MRN: [id number]  HPI Comments: "My right foot looks better"  Patient c/o aching dorsal foot right for about 1 month. She dropped some frozen food on it and immediately was swollen and bruised. It does look better today, but still a large knot. She has been icing.  Foot Pain      Review of Systems  Hematological: Bruises/bleeds easily.  All other systems reviewed and are negative.      Objective:   Physical Exam        Assessment & Plan:

## 2014-01-21 ENCOUNTER — Encounter: Payer: Self-pay | Admitting: Internal Medicine

## 2014-01-22 ENCOUNTER — Other Ambulatory Visit: Payer: Self-pay | Admitting: Internal Medicine

## 2014-01-23 NOTE — Telephone Encounter (Signed)
Ok x 9 months ( last pv was June)

## 2014-01-23 NOTE — Telephone Encounter (Signed)
Sent to the pharmacy by e-scribe. 

## 2014-01-29 ENCOUNTER — Telehealth: Payer: Self-pay | Admitting: Family Medicine

## 2014-01-29 DIAGNOSIS — Z79899 Other long term (current) drug therapy: Secondary | ICD-10-CM

## 2014-01-29 MED ORDER — BENAZEPRIL HCL 20 MG PO TABS: 20.0000 mg | ORAL_TABLET | Freq: Every day | ORAL | Status: DC

## 2014-01-29 MED ORDER — AMLODIPINE BESYLATE 5 MG PO TABS
5.0000 mg | ORAL_TABLET | Freq: Every day | ORAL | Status: DC
Start: 2014-01-29 — End: 2014-06-06

## 2014-01-29 NOTE — Telephone Encounter (Signed)
Received a fax from the [employer]  on [facility] notifying that the pt needs refills of benazepril and amlodipine. Requesting a 90 day supply.  Pt had last CPE in June 2015.  Will refill until June 2016.

## 2014-03-04 ENCOUNTER — Ambulatory Visit (INDEPENDENT_AMBULATORY_CARE_PROVIDER_SITE_OTHER): Payer: Medicare Other | Admitting: Family Medicine

## 2014-03-04 DIAGNOSIS — Z23 Encounter for immunization: Secondary | ICD-10-CM

## 2014-03-24 ENCOUNTER — Ambulatory Visit (AMBULATORY_SURGERY_CENTER): Payer: Self-pay | Admitting: *Deleted

## 2014-03-24 VITALS — Ht 66.0 in | Wt 141.0 lb

## 2014-03-24 DIAGNOSIS — Z1211 Encounter for screening for malignant neoplasm of colon: Secondary | ICD-10-CM

## 2014-03-24 NOTE — Progress Notes (Signed)
No allergies to eggs or soy. No problems with anesthesia.  Pt given Emmi instructions for colonoscopy  No oxygen use  No diet drug use  

## 2014-03-27 ENCOUNTER — Encounter: Payer: Self-pay | Admitting: Internal Medicine

## 2014-04-01 ENCOUNTER — Encounter: Payer: Self-pay | Admitting: Internal Medicine

## 2014-04-01 ENCOUNTER — Ambulatory Visit (AMBULATORY_SURGERY_CENTER): Payer: Medicare Other | Admitting: Internal Medicine

## 2014-04-01 VITALS — BP 154/72 | HR 63 | Temp 97.7°F | Resp 19 | Ht 66.0 in | Wt 141.0 lb

## 2014-04-01 DIAGNOSIS — K644 Residual hemorrhoidal skin tags: Secondary | ICD-10-CM

## 2014-04-01 DIAGNOSIS — Z1211 Encounter for screening for malignant neoplasm of colon: Secondary | ICD-10-CM

## 2014-04-01 DIAGNOSIS — K5730 Diverticulosis of large intestine without perforation or abscess without bleeding: Secondary | ICD-10-CM

## 2014-04-01 DIAGNOSIS — K648 Other hemorrhoids: Secondary | ICD-10-CM

## 2014-04-01 MED ORDER — SODIUM CHLORIDE 0.9 % IV SOLN: 500.0000 mL | INTRAVENOUS | Status: DC

## 2014-04-01 NOTE — Progress Notes (Signed)
Report to PACU, RN, vss, BBS= Clear.  

## 2014-04-01 NOTE — Op Note (Signed)
[place]  [address]. [place], [address]   COLONOSCOPY PROCEDURE REPORT  PATIENT: [name], [name]  MR#: [id number] BIRTHDATE: 1941-05-06 , 73  yrs. old GENDER: female ENDOSCOPIST: [name], MD, [facility] PROCEDURE DATE:  04/01/2014 PROCEDURE:   Colonoscopy, screening First Screening Colonoscopy - Avg.  risk and is 50 yrs.  old or older - No.  Prior Negative Screening - Now for repeat screening. 10 or more years since last screening  History of Adenoma - Now for follow-up colonoscopy & has been > or = to 3 yrs.  N/A  Polyps Removed Today? No.  Polyps Removed Today? No.  Recommend repeat exam, <10 yrs? Polyps Removed Today? No.  Recommend repeat exam, <10 yrs? No. ASA CLASS:   Class III INDICATIONS:average risk for colorectal cancer. MEDICATIONS: Propofol 200 mg IV and Monitored anesthesia care  DESCRIPTION OF PROCEDURE:   After the risks benefits and alternatives of the procedure were thoroughly explained, informed consent was obtained.  The digital rectal exam revealed no abnormalities of the rectum.   The     endoscope was introduced through the anus and advanced to the cecum, which was identified by both the appendix and ileocecal valve. No adverse events experienced.   The quality of the prep was good, using MiraLax  The instrument was then slowly withdrawn as the colon was fully examined.   COLON FINDINGS: There was severe diverticulosis noted in the sigmoid colon with associated luminal narrowing.   The examination was otherwise normal.  Retroflexed views revealed internal hemorrhoids and Retroflexed views revealed external hemorrhoids. The time to cecum=3 minutes 34 seconds.  Withdrawal time=10 minutes 04 seconds. The scope was withdrawn and the procedure completed. COMPLICATIONS: There were no immediate complications.  ENDOSCOPIC IMPRESSION: 1.   There was severe diverticulosis noted in the sigmoid colon 2.   The examination was otherwise  normal except hemorrhoids in ano-rectum  RECOMMENDATIONS: Follow-up as needed Does not need routine colonoscopy again  eSigned:  [name], MD, [facility] 04/01/2014 12:07 PM   cc: The Patient

## 2014-04-01 NOTE — Patient Instructions (Addendum)
No polyps or cancer seen! You do have a condition called diverticulosis - common and not usually a problem. Please read the handout provided. You also have hemorrhoids. If you have hemorrhoid problems (swelling, itching, bleeding) I am able to treat those with an in-office procedure. If you like, please call my office at [phone] to schedule an appointment and I can evaluate you further.  I do not recommend repeating a routine colonoscopy at your age. I will certainly be available to help you if you have gastrointestinal problems, though.  I appreciate the opportunity to care for you. [name], MD, [facility]   Discharge instructions given. Handouts on diverticulosis and hemorrhoids. Resume previous medications. YOU HAD AN ENDOSCOPIC PROCEDURE TODAY AT [facility]: Refer to the procedure report that was given to you for any specific questions about what was found during the examination.  If the procedure report does not answer your questions, please call your gastroenterologist to clarify.  If you requested that your care partner not be given the details of your procedure findings, then the procedure report has been included in a sealed envelope for you to review at your convenience later.  YOU SHOULD EXPECT: Some feelings of bloating in the abdomen. Passage of more gas than usual.  Walking can help get rid of the air that was put into your GI tract during the procedure and reduce the bloating. If you had a lower endoscopy (such as a colonoscopy or flexible sigmoidoscopy) you may notice spotting of blood in your stool or on the toilet paper. If you underwent a bowel prep for your procedure, then you may not have a normal bowel movement for a few days.  DIET: Your first meal following the procedure should be a light meal and then it is ok to progress to your normal diet.  A half-sandwich or bowl of soup is an example of a good first meal.  Heavy or fried foods are harder  to digest and may make you feel nauseous or bloated.  Likewise meals heavy in dairy and vegetables can cause extra gas to form and this can also increase the bloating.  Drink plenty of fluids but you should avoid alcoholic beverages for 24 hours.  ACTIVITY: Your care partner should take you home directly after the procedure.  You should plan to take it easy, moving slowly for the rest of the day.  You can resume normal activity the day after the procedure however you should NOT DRIVE or use heavy machinery for 24 hours (because of the sedation medicines used during the test).    SYMPTOMS TO REPORT IMMEDIATELY: A gastroenterologist can be reached at any hour.  During normal business hours, 8:30 AM to 5:00 PM Monday through Friday, call [phone].  After hours and on weekends, please call the GI answering service at [phone] who will take a message and have the physician on call contact you.   Following lower endoscopy (colonoscopy or flexible sigmoidoscopy):  Excessive amounts of blood in the stool  Significant tenderness or worsening of abdominal pains  Swelling of the abdomen that is new, acute  Fever of 100F or higher FOLLOW UP: If any biopsies were taken you will be contacted by phone or by letter within the next 1-3 weeks.  Call your gastroenterologist if you have not heard about the biopsies in 3 weeks.  Our staff will call the home number listed on your records the next business day following your procedure to  check on you and address any questions or concerns that you may have at that time regarding the information given to you following your procedure. This is a courtesy call and so if there is no answer at the home number and we have not heard from you through the emergency physician on call, we will assume that you have returned to your regular daily activities without incident.  SIGNATURES/CONFIDENTIALITY: You and/or your care partner have signed paperwork which will be  entered into your electronic medical record.  These signatures attest to the fact that that the information above on your After Visit Summary has been reviewed and is understood.  Full responsibility of the confidentiality of this discharge information lies with you and/or your care-partner.

## 2014-04-02 ENCOUNTER — Telehealth: Payer: Self-pay

## 2014-04-02 NOTE — Telephone Encounter (Signed)
  Follow up Call-  Call back number 04/01/2014  Post procedure Call Back phone  # [phone]  Permission to leave phone message Yes     Patient questions:  Do you have a fever, pain , or abdominal swelling? No. Pain Score  0 *  Have you tolerated food without any problems? Yes.    Have you been able to return to your normal activities? Yes.    Do you have any questions about your discharge instructions: Diet   No. Medications  No. Follow up visit  No.  Do you have questions or concerns about your Care? No.  Actions: * If pain score is 4 or above: No action needed, pain <4.   No problems per the pt. maw

## 2014-04-03 ENCOUNTER — Other Ambulatory Visit: Payer: Self-pay | Admitting: Internal Medicine

## 2014-04-03 NOTE — Telephone Encounter (Signed)
Declined.  Pt should have refills on file.

## 2014-05-23 DIAGNOSIS — K8020 Calculus of gallbladder without cholecystitis without obstruction: Secondary | ICD-10-CM

## 2014-05-23 HISTORY — DX: Calculus of gallbladder without cholecystitis without obstruction: K80.20

## 2014-05-27 ENCOUNTER — Encounter: Payer: Self-pay | Admitting: Internal Medicine

## 2014-05-27 ENCOUNTER — Ambulatory Visit (INDEPENDENT_AMBULATORY_CARE_PROVIDER_SITE_OTHER): Payer: Medicare Other | Admitting: Internal Medicine

## 2014-05-27 VITALS — BP 170/104 | Temp 97.9°F | Ht 65.75 in | Wt 138.8 lb

## 2014-05-27 DIAGNOSIS — Z658 Other specified problems related to psychosocial circumstances: Secondary | ICD-10-CM

## 2014-05-27 DIAGNOSIS — G500 Trigeminal neuralgia: Secondary | ICD-10-CM

## 2014-05-27 DIAGNOSIS — F439 Reaction to severe stress, unspecified: Secondary | ICD-10-CM

## 2014-05-27 DIAGNOSIS — Z79899 Other long term (current) drug therapy: Secondary | ICD-10-CM

## 2014-05-27 DIAGNOSIS — I10 Essential (primary) hypertension: Secondary | ICD-10-CM

## 2014-05-27 LAB — CBC WITH DIFFERENTIAL/PLATELET
Basophils Absolute: 0 10*3/uL (ref 0.0–0.1)
Basophils Relative: 0.6 % (ref 0.0–3.0)
Eosinophils Absolute: 0.1 10*3/uL (ref 0.0–0.7)
Eosinophils Relative: 1.9 % (ref 0.0–5.0)
HCT: 45.9 % (ref 36.0–46.0)
Hemoglobin: 15.2 g/dL — ABNORMAL HIGH (ref 12.0–15.0)
Lymphocytes Relative: 13.4 % (ref 12.0–46.0)
Lymphs Abs: 0.7 10*3/uL (ref 0.7–4.0)
MCHC: 33.1 g/dL (ref 30.0–36.0)
MCV: 91.3 fl (ref 78.0–100.0)
Monocytes Absolute: 0.4 10*3/uL (ref 0.1–1.0)
Monocytes Relative: 6.5 % (ref 3.0–12.0)
Neutro Abs: 4.3 10*3/uL (ref 1.4–7.7)
Neutrophils Relative %: 77.6 % — ABNORMAL HIGH (ref 43.0–77.0)
Platelets: 242 10*3/uL (ref 150.0–400.0)
RBC: 5.03 Mil/uL (ref 3.87–5.11)
RDW: 13.6 % (ref 11.5–15.5)
WBC: 5.6 10*3/uL (ref 4.0–10.5)

## 2014-05-27 LAB — BASIC METABOLIC PANEL
BUN: 16 mg/dL (ref 6–23)
CO2: 29 mEq/L (ref 19–32)
Calcium: 9.6 mg/dL (ref 8.4–10.5)
Chloride: 101 mEq/L (ref 96–112)
Creatinine, Ser: 0.8 mg/dL (ref 0.4–1.2)
GFR: 71.26 mL/min (ref >60.00)
Glucose, Bld: 96 mg/dL (ref 70–99)
Potassium: 4.6 mEq/L (ref 3.5–5.1)
Sodium: 136 mEq/L (ref 135–145)

## 2014-05-27 MED ORDER — BUTALBITAL-ASA-CAFF-CODEINE 50-325-40-30 MG PO CAPS: 1.0000 | ORAL_CAPSULE | ORAL | Status: DC | PRN

## 2014-05-27 MED ORDER — ALBUTEROL SULFATE HFA 108 (90 BASE) MCG/ACT IN AERS: 2.0000 | INHALATION_SPRAY | Freq: Four times a day (QID) | RESPIRATORY_TRACT | Status: DC | PRN

## 2014-05-27 NOTE — Progress Notes (Signed)
Pre visit review using our clinic review tool, if applicable. No additional management support is needed unless otherwise documented below in the visit note.  Chief Complaint  Patient presents with  . Follow-up    HPI: [name] 74 y.o.  Here for fu meds and disease management   Bp  Up from stress usually good at home.  Less sleep.worried about grand child and son in law mental stablilty and control issues  Very stressed and angry about this   tegrtetol doing  . Its job    Has almost every night  2-3 am and then codiene for night in a row.   Uses   fiornial  About once a week.  No other med change ROS: See pertinent positives and negatives per HPI.  Past Medical History  Diagnosis Date  . GERD (gastroesophageal reflux disease)   . Hyperlipidemia   . Hypertension     echo nl lv function  mild dilitation 2009  . Migraine     hypnic HA eval by Dr. Earley Favor in the past  . Abnormal blood finding     elevated Hg and hct    . 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 Hearing aid worn   . Polycythemia   . Trigeminal neuralgia pain   . Closed head injury 02/01/2011    from syncope and had scalp laceration  neg ct .    Marland Kitchen Syncope 02/01/2011    In shower on vacation  sustained head laceration  8 sutures Had ed visit neg head ct labs and x ray   . 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 Medication side effect 09/02/2010    Poss muscle se of 10 crestor     Family History  Problem Relation Age of Onset  . Ovarian cancer Mother   . Stroke Mother   . Alcohol abuse Father   . Stroke Father   . Seizures Daughter   . Hypertension    . Diabetes Brother   . Cancer Paternal Aunt     leukemia, unknown type  . Colon cancer Neg Hx     History   Social History  . Marital Status: Married    Spouse Name: N/A    Number of Children: 2  . Years of Education: N/A   Occupational History  .      retired Management consultant   Social History Main Topics  . Smoking status: Never Smoker   . Smokeless tobacco: Never Used  . Alcohol Use: 1.2 oz/week    0 Not specified, 2 Glasses of wine per week  . Drug Use: No  . Sexual Activity: None   Other Topics Concern  . None   Social History Narrative   Married   HH of 2-3 (god daughter)   Pets 2 dogs   Non smoker    Child is a physician   G2P2                Outpatient Encounter Prescriptions as of 05/27/2014  Medication Sig  . albuterol (PROVENTIL HFA;VENTOLIN HFA) 108 (90 BASE) MCG/ACT inhaler Inhale 2 puffs into the lungs every 6 (six) hours as needed.  Marland Kitchen amLODipine (NORVASC) 5 MG tablet Take 1 tablet (5 mg total) by mouth daily.  . benazepril (LOTENSIN) 20 MG tablet Take 1 tablet (20 mg total) by mouth daily.  . butalbital-aspirin-caffeine-codeine (  FIORINAL WITH CODEINE) 50-325-40-30 MG capsule Take 1 capsule by mouth every 4 (four) hours as needed for migraine. 1-2 by mouth every 4-6 hours as needed max of 6 tabs in 24 hours  . EPITOL 200 MG tablet TAKE TWO TABLETS BY MOUTH TWICE DAILY  . triamterene-hydrochlorothiazide (MAXZIDE-25) 37.5-25 MG per tablet Take 1 tablet by mouth daily.  . [DISCONTINUED] albuterol (PROVENTIL HFA;VENTOLIN HFA) 108 (90 BASE) MCG/ACT inhaler Inhale 2 puffs into the lungs every 6 (six) hours as needed.  . [DISCONTINUED] butalbital-aspirin-caffeine-codeine (FIORINAL WITH CODEINE) 50-325-40-30 MG capsule Take 1 capsule by mouth every 4 (four) hours as needed for migraine. 1-2 by mouth every 4-6 hours as needed max of 6 tabs in 24 hours  . [DISCONTINUED] bisacodyl (DULCOLAX) 5 MG EC tablet Take 5 mg by mouth as directed. Dulcolax as directed for colonoscopy prep  . [DISCONTINUED] Polyethylene Glycol 3350 (MIRALAX PO) Take by mouth as directed. Miralax 238 Grams as directed for colonoscopy prep  . [DISCONTINUED] tamsulosin (FLOMAX) 0.4 MG CAPS capsule Take 0.4 mg by mouth daily.     EXAM:  BP 170/104 mmHg  Temp(Src)  97.9 F (36.6 C) (Oral)  Ht 5' 5.75" (1.67 m)  Wt 138 lb 12.8 oz (62.959 kg)  BMI 22.57 kg/m2  Body mass index is 22.57 kg/(m^2).  GENERAL: vitals reviewed and listed above, alert, oriented, appears well hydrated and in no acute distress HEENT: atraumatic, conjunctiva  clear, no obvious abnormalities on inspection of external nose and ears LUNGS: clear to auscultation bilaterally, no wheezes, rales or rhonchi, good air movement CV: HRRR, no clubbing cyanosis or  peripheral edema nl cap refill  MS: moves all extremities without noticeable focal  abnormality PSYCH: pleasant and cooperative, stressed and emotional  When related social situation cogntion intact  Lab Results  Component Value Date   WBC 5.6 05/27/2014   HGB 15.2* 05/27/2014   HCT 45.9 05/27/2014   PLT 242.0 05/27/2014   GLUCOSE 96 05/27/2014   CHOL 230* 11/11/2013   TRIG 51.0 11/11/2013   HDL 117.70 11/11/2013   LDLDIRECT 135.9 10/16/2012   LDLCALC 102* 11/11/2013   ALT 25 11/11/2013   AST 27 11/11/2013   NA 136 05/27/2014   K 4.6 05/27/2014   CL 101 05/27/2014   CREATININE 0.8 05/27/2014   BUN 16 05/27/2014   CO2 29 05/27/2014   TSH 0.66 11/11/2013    ASSESSMENT AND PLAN:  Discussed the following assessment and plan:  Trigeminal neuralgia - stable on meds  check monitoriug labs - Plan: Basic metabolic panel, CBC with Differential  Essential hypertension - too hihg today pt says fro stress document and inc med if elevaetd at home  - Plan: Basic metabolic panel, CBC with Differential  Medication management - Plan: Basic metabolic panel, CBC with Differential  Stress  -Patient advised to return or notify health care team  if symptoms worsen ,persist or new concerns arise.  Patient Instructions  Get more bp readings so we can   Make sure not affecting you headaches .  Take blood pressure readings twice a day for 7- 14 days and then periodically .To ensure below 140/90   .Send in readings    .Marland Kitchen If elevated    At home we can increase the amlodipine to 10 mg per day .   If having to use a lor of headache medicine may need to try different  Strategy.  Will notify you  of labs when available.  rov in 2 months   bp  [name]. Chi Woodham M.D. Total visit 54mns > 50% spent counseling and coordinating care

## 2014-05-27 NOTE — Patient Instructions (Addendum)
Get more bp readings so we can   Make sure not affecting you headaches .  Take blood pressure readings twice a day for 7- 14 days and then periodically .To ensure below 140/90   .Send in readings    .Marland Kitchen If elevated   At home we can increase the amlodipine to 10 mg per day .   If having to use a lor of headache medicine may need to try different  Strategy.  Will notify you  of labs when available.  rov in 2 months   bp

## 2014-05-29 ENCOUNTER — Encounter: Payer: Self-pay | Admitting: Family Medicine

## 2014-05-29 DIAGNOSIS — I10 Essential (primary) hypertension: Secondary | ICD-10-CM | POA: Insufficient documentation

## 2014-06-06 ENCOUNTER — Other Ambulatory Visit: Payer: Self-pay | Admitting: *Deleted

## 2014-06-06 MED ORDER — AMLODIPINE BESYLATE 10 MG PO TABS: 10.0000 mg | ORAL_TABLET | Freq: Every day | ORAL | Status: DC

## 2014-06-06 NOTE — Telephone Encounter (Signed)
After reviewing patient's blood pressure readings Dr Regis Bill has ordered Amlodipine 10 mg, qd, #90 with 1 refill. Patient is aware.

## 2014-06-18 ENCOUNTER — Encounter: Payer: Self-pay | Admitting: Internal Medicine

## 2014-06-19 NOTE — Telephone Encounter (Signed)
Send a message to patient   Stop the maxide  And begin  Chlorthalidone  25 mg per day disp 30 refill x 1 ( different diuretic )  BMP in  10-14 days ( to make sure potassium doesn't drop.)   Continue  monitoring  And sending in readings

## 2014-06-20 MED ORDER — CHLORTHALIDONE 25 MG PO TABS: 25.0000 mg | ORAL_TABLET | Freq: Every day | ORAL | Status: DC

## 2014-07-01 LAB — HM MAMMOGRAPHY

## 2014-07-02 ENCOUNTER — Encounter: Payer: Self-pay | Admitting: Family Medicine

## 2014-07-08 ENCOUNTER — Other Ambulatory Visit (INDEPENDENT_AMBULATORY_CARE_PROVIDER_SITE_OTHER): Payer: Medicare Other

## 2014-07-08 ENCOUNTER — Telehealth: Payer: Self-pay | Admitting: Internal Medicine

## 2014-07-08 DIAGNOSIS — Z79899 Other long term (current) drug therapy: Secondary | ICD-10-CM

## 2014-07-08 LAB — BASIC METABOLIC PANEL
BUN: 23 mg/dL (ref 6–23)
CO2: 31 mEq/L (ref 19–32)
Calcium: 9.3 mg/dL (ref 8.4–10.5)
Chloride: 94 mEq/L — ABNORMAL LOW (ref 96–112)
Creatinine, Ser: 0.99 mg/dL (ref 0.40–1.20)
GFR: 58.12 mL/min — ABNORMAL LOW (ref >60.00)
Glucose, Bld: 90 mg/dL (ref 70–99)
Potassium: 4.1 mEq/L (ref 3.5–5.1)
Sodium: 130 mEq/L — ABNORMAL LOW (ref 135–145)

## 2014-07-08 NOTE — Telephone Encounter (Signed)
Order placed in the system.

## 2014-07-08 NOTE — Telephone Encounter (Signed)
Patient is coming at 2:15 for a lab appointment, can you please enter the order?

## 2014-07-11 NOTE — Telephone Encounter (Signed)
Thanks for th update   It looks like  some better but metoprolol helped some also   Keep monitoring

## 2014-07-13 ENCOUNTER — Emergency Department (HOSPITAL_COMMUNITY)
Admission: EM | Admit: 2014-07-13 | Discharge: 2014-07-13 | Disposition: A | Discharge status: Home / Self Care | Payer: Medicare Other | Source: Home / Self Care | Attending: Family Medicine | Admitting: Family Medicine

## 2014-07-13 ENCOUNTER — Encounter (HOSPITAL_COMMUNITY): Payer: Self-pay | Admitting: Emergency Medicine

## 2014-07-13 DIAGNOSIS — R09A9 Foreign body sensation, other site: Secondary | ICD-10-CM

## 2014-07-13 DIAGNOSIS — H61892 Other specified disorders of left external ear: Secondary | ICD-10-CM | POA: Diagnosis not present

## 2014-07-13 NOTE — ED Notes (Signed)
Pt states that her hearing aide broke off and she believes that a piece fell into her ear.

## 2014-07-13 NOTE — ED Provider Notes (Signed)
CSN: [id number]     Arrival date & time 07/13/14  1738 History   First MD Initiated Contact with Patient 07/13/14 1807     Chief Complaint  Patient presents with  . Foreign Body in [place]   (Consider location/radiation/quality/duration/timing/severity/associated sxs/prior Treatment) HPI 74 year old female presents complaining of having a piece of her hearing aid broken off inside her ear. This happened this morning. No pain associated with this. Her daughter looked in her ear and said that she saw the pieces of the ear.       Past Medical History  Diagnosis Date  . GERD (gastroesophageal reflux disease)   . Hyperlipidemia   . Hypertension     echo nl lv function  mild dilitation 2009  . Migraine     hypnic HA eval by Dr. Earley Favor in the past  . Abnormal blood finding     elevated Hg and hct    . 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 Hearing aid worn   . Polycythemia   . Trigeminal neuralgia pain   . Closed head injury 02/01/2011    from syncope and had scalp laceration  neg ct .    Marland Kitchen Syncope 02/01/2011    In shower on vacation  sustained head laceration  8 sutures Had ed visit neg head ct labs and x ray   . 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 Medication side effect 09/02/2010    Poss muscle se of 10 crestor    Past Surgical History  Procedure Laterality Date  . Doppler echocardiography  2009    nl lv function mild lv dilitation  . Abdominal hysterectomy  2002    tubal  . Cardiac catheterization  2000    chest pains neg  . Rt shoulder surgery    . Oophorectomy  2002  . Appendectomy  2002  . Craniotomy  July 19 13    nerve decompression right trigeminal   . Colonoscopy      multiple   Family History  Problem Relation Age of Onset  . Ovarian cancer Mother   . Stroke Mother   . Alcohol abuse Father   . Stroke Father   . Seizures Daughter   . Hypertension    . Diabetes Brother   . Cancer Paternal  Aunt     leukemia, unknown type  . Colon cancer Neg Hx    History  Substance Use Topics  . Smoking status: Never Smoker   . Smokeless tobacco: Never Used  . Alcohol Use: 1.2 oz/week    0 Standard drinks or equivalent, 2 Glasses of wine per week   OB History    Gravida Para Term Preterm AB TAB SAB Ectopic Multiple Living   2 2             Review of Systems  HENT:       See history of present illness  All other systems reviewed and are negative.   Allergies  Sulfamethoxazole-trimethoprim  Home Medications   Prior to Admission medications   Medication Sig Start Date End Date Taking? Authorizing Provider  amLODipine (NORVASC) 10 MG tablet Take 1 tablet (10 mg total) by mouth daily. 06/06/14  Yes [name], MD  benazepril (LOTENSIN) 20 MG tablet Take 1 tablet (20 mg total) by mouth daily. 01/29/14  Yes [name], MD  chlorthalidone (HYGROTON) 25 MG tablet  Take 1 tablet (25 mg total) by mouth daily. 06/20/14  Yes [name], MD  EPITOL 200 MG tablet TAKE TWO TABLETS BY MOUTH TWICE DAILY 01/23/14  Yes [name], MD  albuterol (PROVENTIL HFA;VENTOLIN HFA) 108 (90 BASE) MCG/ACT inhaler Inhale 2 puffs into the lungs every 6 (six) hours as needed. 05/27/14   [name], MD  butalbital-aspirin-caffeine-codeine [facility] WITH CODEINE) [phone] MG capsule Take 1 capsule by mouth every 4 (four) hours as needed for migraine. 1-2 by mouth every 4-6 hours as needed max of 6 tabs in 24 hours 05/27/14   [name], MD   BP 134/62 mmHg  Pulse 82  Temp(Src) 98.2 F (36.8 C) (Oral)  Resp 18  SpO2 96% Physical Exam  Constitutional: She is oriented to person, place, and time. Vital signs are normal. She appears well-developed and well-nourished. No distress.  HENT:  Head: Normocephalic and atraumatic.  There is no foreign body in the ear canal  Pulmonary/Chest: Effort normal. No respiratory distress.  Neurological: She is alert and oriented to person, place, and time. She  has normal strength. Coordination normal.  Skin: Skin is warm and dry. No rash noted. She is not diaphoretic.  Psychiatric: She has a normal mood and affect. Judgment normal.  Nursing note and vitals reviewed.   ED Course  Procedures (including critical care time) Labs Review Labs Reviewed - No data to display  Imaging Review No results found.   MDM   1. Foreign body sensation in ear canal, left    We looked and did not see anything in the ear canal. We attempted to flush out the ear canal and look again, there is still no foreign body. The piece of the hearing aid is not in her ear canal. Follow-up when necessary    Liam Graham, PA-C 07/13/14 1841

## 2014-07-13 NOTE — Discharge Instructions (Signed)
The piece of your hearing aid is not in your ear. If you develop any symptoms of ear pain or drainage, you may return for [place] to check again if you desire.

## 2014-07-25 ENCOUNTER — Ambulatory Visit: Payer: Medicare Other | Admitting: Internal Medicine

## 2014-07-29 ENCOUNTER — Ambulatory Visit (INDEPENDENT_AMBULATORY_CARE_PROVIDER_SITE_OTHER): Payer: Medicare Other | Admitting: Internal Medicine

## 2014-07-29 ENCOUNTER — Encounter: Payer: Self-pay | Admitting: Internal Medicine

## 2014-07-29 VITALS — BP 126/70 | Temp 97.6°F | Ht 65.75 in | Wt 138.2 lb

## 2014-07-29 DIAGNOSIS — Z79899 Other long term (current) drug therapy: Secondary | ICD-10-CM

## 2014-07-29 DIAGNOSIS — I10 Essential (primary) hypertension: Secondary | ICD-10-CM

## 2014-07-29 NOTE — Progress Notes (Signed)
Pre visit review using our clinic review tool, if applicable. No additional management support is needed unless otherwise documented below in the visit note.   Chief Complaint  Patient presents with  . Follow-up  . Hypertension    HPI: [name] 74 y.o. comes in for follow-up of a number of issues since her last visit we have been monitoring her blood pressure and  have added chlorthalidone. His was about January 29.BP is better over the past week .  Taking chlorthalidone and maxzide. Because we apparently didn't communicate to stop her [place] is  Some better  . Did have a get away felt better blood pressure seems to be in control over the last few days. No side effects noted. No change in her headaches. ROS: See pertinent positives and negatives per HPI.  Past Medical History  Diagnosis Date  . GERD (gastroesophageal reflux disease)   . Hyperlipidemia   . Hypertension     echo nl lv function  mild dilitation 2009  . Migraine     hypnic HA eval by Dr. Earley Favor in the past  . Abnormal blood finding     elevated Hg and hct    . 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 Hearing aid worn   . Polycythemia   . Trigeminal neuralgia pain   . Closed head injury 02/01/2011    from syncope and had scalp laceration  neg ct .    Marland Kitchen Syncope 02/01/2011    In shower on vacation  sustained head laceration  8 sutures Had ed visit neg head ct labs and x ray   . 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 Medication side effect 09/02/2010    Poss muscle se of 10 crestor     Family History  Problem Relation Age of Onset  . Ovarian cancer Mother   . Stroke Mother   . Alcohol abuse Father   . Stroke Father   . Seizures Daughter   . Hypertension    . Diabetes Brother   . Cancer Paternal Aunt     leukemia, unknown type  . Colon cancer Neg Hx     History   Social History  . Marital Status: Married   Spouse Name: N/A  . Number of Children: 2  . Years of Education: N/A   Occupational History  .      retired Forensic psychologist   Social History Main Topics  . Smoking status: Never Smoker   . Smokeless tobacco: Never Used  . Alcohol Use: 1.2 oz/week    0 Standard drinks or equivalent, 2 Glasses of wine per week  . Drug Use: No  . Sexual Activity: Not on file   Other Topics Concern  . Not on file   Social History Narrative   Married   HH of 2-3 (god daughter)   Pets 2 dogs   Non smoker    Child is a physician   G2P2                Outpatient Encounter Prescriptions as of 07/29/2014  Medication Sig  . albuterol (PROVENTIL HFA;VENTOLIN HFA) 108 (90 BASE) MCG/ACT inhaler Inhale 2 puffs into the lungs every 6 (six) hours as needed.  Marland Kitchen amLODipine (NORVASC) 10 MG tablet Take 1 tablet (10 mg total) by mouth daily.  . benazepril (LOTENSIN) 20 MG tablet Take  1 tablet (20 mg total) by mouth daily.  . butalbital-aspirin-caffeine-codeine (FIORINAL WITH CODEINE) 50-325-40-30 MG capsule Take 1 capsule by mouth every 4 (four) hours as needed for migraine. 1-2 by mouth every 4-6 hours as needed max of 6 tabs in 24 hours  . chlorthalidone (HYGROTON) 25 MG tablet Take 1 tablet (25 mg total) by mouth daily.  . EPITOL 200 MG tablet TAKE TWO TABLETS BY MOUTH TWICE DAILY  . [DISCONTINUED] triamterene-hydrochlorothiazide (MAXZIDE-25) 37.5-25 MG per tablet     EXAM:  BP 126/70 mmHg  Temp(Src) 97.6 F (36.4 C) (Oral)  Ht 5' 5.75" (1.67 m)  Wt 138 lb 3.2 oz (62.687 kg)  BMI 22.48 kg/m2  Body mass index is 22.48 kg/(m^2).  GENERAL: vitals reviewed and listed above, alert, oriented, appears well hydrated and in no acute distress HEENT: atraumatic, conjunctiva  clear, no obvious abnormalities on inspection of external nose and ears  MS: moves all extremities without noticeable focal  abnormality PSYCH: pleasant and cooperative, no obvious depression or anxiety Lab Results  Component Value  Date   WBC 5.6 05/27/2014   HGB 15.2* 05/27/2014   HCT 45.9 05/27/2014   PLT 242.0 05/27/2014   GLUCOSE 90 07/08/2014   CHOL 230* 11/11/2013   TRIG 51.0 11/11/2013   HDL 117.70 11/11/2013   LDLDIRECT 135.9 10/16/2012   LDLCALC 102* 11/11/2013   ALT 25 11/11/2013   AST 27 11/11/2013   NA 130* 07/08/2014   K 4.1 07/08/2014   CL 94* 07/08/2014   CREATININE 0.99 07/08/2014   BUN 23 07/08/2014   CO2 31 07/08/2014   TSH 0.66 11/11/2013    ASSESSMENT AND PLAN:  Discussed the following assessment and plan:  Essential hypertension - Much better on chlorthalidone. Doesn't need to be on 2 diuretics stopped the Maxzide BMP in 3-4 weeks if okay routine follow-up. - Plan: Basic metabolic panel  Medication management  -Patient advised to return or notify health care team  if symptoms worsen ,persist or new concerns arise.  Patient Instructions  Go off of the  maxide  , triam /hctz  For now .( also a diuretic ) Stay on the chlorthalidone  Every day .  Check lab BMP in about 3- weeks  blood pressure is much better  . If labs are stable and bp controlled then plan routine follow up as appropriate.     [name]. [name] M.D.

## 2014-07-29 NOTE — Patient Instructions (Signed)
Go off of the  maxide  , triam /hctz  For now .( also a diuretic ) Stay on the chlorthalidone  Every day .  Check lab BMP in about 3- weeks  blood pressure is much better  . If labs are stable and bp controlled then plan routine follow up as appropriate.

## 2014-08-14 ENCOUNTER — Other Ambulatory Visit: Payer: Self-pay | Admitting: Internal Medicine

## 2014-08-18 NOTE — Telephone Encounter (Signed)
Sent to the pharmacy by e-scribe for 3 months.  Pt due for wellness exam in June.  Reminder sent by mail.

## 2014-08-19 ENCOUNTER — Encounter: Payer: Self-pay | Admitting: *Deleted

## 2014-08-19 ENCOUNTER — Other Ambulatory Visit (INDEPENDENT_AMBULATORY_CARE_PROVIDER_SITE_OTHER): Payer: Medicare Other

## 2014-08-19 DIAGNOSIS — I10 Essential (primary) hypertension: Secondary | ICD-10-CM | POA: Diagnosis not present

## 2014-08-19 LAB — BASIC METABOLIC PANEL
BUN: 21 mg/dL (ref 6–23)
CO2: 31 mEq/L (ref 19–32)
Calcium: 9.4 mg/dL (ref 8.4–10.5)
Chloride: 99 mEq/L (ref 96–112)
Creatinine, Ser: 0.99 mg/dL (ref 0.40–1.20)
GFR: 58.1 mL/min — ABNORMAL LOW (ref >60.00)
Glucose, Bld: 84 mg/dL (ref 70–99)
Potassium: 4.2 mEq/L (ref 3.5–5.1)
Sodium: 135 mEq/L (ref 135–145)

## 2014-08-20 ENCOUNTER — Encounter: Payer: Self-pay | Admitting: Internal Medicine

## 2014-08-21 ENCOUNTER — Encounter: Payer: Self-pay | Admitting: Family Medicine

## 2014-08-21 MED ORDER — METOPROLOL TARTRATE 50 MG PO TABS: 25.0000 mg | ORAL_TABLET | Freq: Two times a day (BID) | ORAL | Status: DC

## 2014-08-21 NOTE — Telephone Encounter (Signed)
For now  I agree with adding metoprolol  50 mg disp #) # refill x 1   Take 25 mg bid  Misty please send in   Let us know bp readings in the next 2 weeks   (I wouldn't have thought that stopping 1 of the diuretric would have caused  elevated bp in this range .)

## 2014-09-15 ENCOUNTER — Ambulatory Visit (INDEPENDENT_AMBULATORY_CARE_PROVIDER_SITE_OTHER): Payer: Medicare Other | Admitting: Family Medicine

## 2014-09-15 ENCOUNTER — Encounter: Payer: Self-pay | Admitting: Family Medicine

## 2014-09-15 VITALS — BP 100/72 | HR 60 | Temp 97.8°F | Ht 65.75 in | Wt 140.5 lb

## 2014-09-15 DIAGNOSIS — H00013 Hordeolum externum right eye, unspecified eyelid: Secondary | ICD-10-CM | POA: Diagnosis not present

## 2014-09-15 NOTE — Progress Notes (Signed)
Pre visit review using our clinic review tool, if applicable. No additional management support is needed unless otherwise documented below in the visit note. 

## 2014-09-15 NOTE — Progress Notes (Signed)
HPI:  Issue with her eyelid: -started about 2 weeks ago -small mildly sore and mildly itchy bump on lower eyelid R -now improving the last few days ago -denies: pus, vision changes, fevers  ROS: See pertinent positives and negatives per HPI.  Past Medical History  Diagnosis Date  . GERD (gastroesophageal reflux disease)   . Hyperlipidemia   . Hypertension     echo nl lv function  mild dilitation 2009  . Migraine     hypnic HA eval by Dr. Earley Favor in the past  . Abnormal blood finding     elevated Hg and hct    . 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 Hearing aid worn   . Polycythemia   . Trigeminal neuralgia pain   . Closed head injury 02/01/2011    from syncope and had scalp laceration  neg ct .    Marland Kitchen Syncope 02/01/2011    In shower on vacation  sustained head laceration  8 sutures Had ed visit neg head ct labs and x ray   . 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 Medication side effect 09/02/2010    Poss muscle se of 10 crestor     Past Surgical History  Procedure Laterality Date  . Doppler echocardiography  2009    nl lv function mild lv dilitation  . Abdominal hysterectomy  2002    tubal  . Cardiac catheterization  2000    chest pains neg  . Rt shoulder surgery    . Oophorectomy  2002  . Appendectomy  2002  . Craniotomy  July 19 13    nerve decompression right trigeminal   . Colonoscopy      multiple    Family History  Problem Relation Age of Onset  . Ovarian cancer Mother   . Stroke Mother   . Alcohol abuse Father   . Stroke Father   . Seizures Daughter   . Hypertension    . Diabetes Brother   . Cancer Paternal Aunt     leukemia, unknown type  . Colon cancer Neg Hx     History   Social History  . Marital Status: Married    Spouse Name: N/A  . Number of Children: 2  . Years of Education: N/A   Occupational History  .      retired Forensic psychologist   Social History Main  Topics  . Smoking status: Never Smoker   . Smokeless tobacco: Never Used  . Alcohol Use: 1.2 oz/week    0 Standard drinks or equivalent, 2 Glasses of wine per week  . Drug Use: No  . Sexual Activity: Not on file   Other Topics Concern  . 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  albuterol (PROVENTIL HFA;VENTOLIN HFA) 108 (90 BASE) MCG/ACT inhaler, Inhale 2 puffs into the lungs every 6 (six) hours as needed., Disp: 1 Inhaler, Rfl: 2 .  amLODipine (NORVASC) 10 MG tablet, Take 1 tablet (10 mg total) by mouth daily., Disp: 90 tablet, Rfl: 1 .  benazepril (LOTENSIN) 20 MG tablet, Take 1 tablet (20 mg total)  by mouth daily., Disp: 90 tablet, Rfl: 2 .  butalbital-aspirin-caffeine-codeine (FIORINAL WITH CODEINE) 50-325-40-30 MG capsule, Take 1 capsule by mouth every 4 (four) hours as needed for migraine. 1-2 by mouth every 4-6 hours as needed max of 6 tabs in 24 hours, Disp: 30 capsule, Rfl: 0 .  chlorthalidone (HYGROTON) 25 MG tablet, TAKE ONE TABLET BY MOUTH ONCE DAILY, Disp: 30 tablet, Rfl: 2 .  EPITOL 200 MG tablet, TAKE TWO TABLETS BY MOUTH TWICE DAILY, Disp: 360 tablet, Rfl: 2 .  metoprolol (LOPRESSOR) 50 MG tablet, Take 0.5 tablets (25 mg total) by mouth 2 (two) times daily., Disp: 30 tablet, Rfl: 0  EXAM:  Filed Vitals:   09/15/14 1503  BP: 100/72  Pulse: 60  Temp: 97.8 F (36.6 C)    Body mass index is 22.85 kg/(m^2).  GENERAL: vitals reviewed and listed above, alert, oriented, appears well hydrated and in no acute distress  HEENT: atraumatic, conjunttiva clear, small sty R lower eyelid, no obvious abnormalities on inspection of external nose and ears  NECK: no obvious masses on inspection  MS: moves all extremities without noticeable abnormality  PSYCH: pleasant and cooperative, no obvious depression or anxiety  ASSESSMENT  AND PLAN:  Discussed the following assessment and plan:  Sty, external, right  -advised since improving, warm compresses and follow up with her eye doctor if persists in 1-2 weeks -Patient advised to return or notify a doctor immediately if symptoms worsen or persist or new concerns arise.  Patient Instructions  Sty A sty (hordeolum) is an infection of a gland in the eyelid located at the base of the eyelash. A sty may develop a white or yellow head of pus. It can be puffy (swollen). Usually, the sty will burst and pus will come out on its own. They do not leave lumps in the eyelid once they drain. A sty is often confused with another form of cyst of the eyelid called a chalazion. Chalazions occur within the eyelid and not on the edge where the bases of the eyelashes are. They often are red, sore and then form firm lumps in the eyelid. SYMPTOMS   Tenderness, redness and swelling along the edge of the eyelid at the base of the eyelashes.  Sometimes, there is a white or yellow head of pus. It may or may not drain. DIAGNOSIS  An ophthalmologist will be able to distinguish between a sty and a chalazion and treat the condition appropriately.  TREATMENT   Styes are typically treated with warm packs (compresses) until drainage occurs.  In rare cases, medicines that kill germs (antibiotics) may be prescribed. These antibiotics may be in the form of drops, cream or pills.  If a hard lump has formed, it is generally necessary to do a small incision and remove the hardened contents of the cyst in a minor surgical procedure done in the office.  In suspicious cases, your caregiver may send the contents of the cyst to the lab to be certain that it is not a rare, but dangerous form of cancer of the glands of the eyelid. HOME CARE INSTRUCTIONS   Wash your hands often and dry them with a clean towel. Avoid touching your eyelid. This may spread the infection to other parts of the eye.  Apply heat to  your eyelid for 10 to 20 minutes, several times a day, to ease pain and help to heal it faster.  Do not squeeze the sty. Allow it to drain on its own. Wash your  eyelid carefully 3 to 4 times per day to remove any pus. SEEK IMMEDIATE MEDICAL CARE IF:   Your eye becomes painful or puffy (swollen).  Your vision changes.  Your sty does not drain by itself within 3 days.  Your sty comes back within a short period of time, even with treatment.  You have redness (inflammation) around the eye.  You have a fever. Document Released: 02/16/2005 Document Revised: 08/01/2011 Document Reviewed: 08/23/2013 [facility] Patient Information 2015 [place], [place]. This information is not intended to replace advice given to you by your health care provider. Make sure you discuss any questions you have with your health care provider.      Colin [name] R.

## 2014-09-15 NOTE — Patient Instructions (Signed)
Sty A sty (hordeolum) is an infection of a gland in the eyelid located at the base of the eyelash. A sty may develop a white or yellow head of pus. It can be puffy (swollen). Usually, the sty will burst and pus will come out on its own. They do not leave lumps in the eyelid once they drain. A sty is often confused with another form of cyst of the eyelid called a chalazion. Chalazions occur within the eyelid and not on the edge where the bases of the eyelashes are. They often are red, sore and then form firm lumps in the eyelid. SYMPTOMS   Tenderness, redness and swelling along the edge of the eyelid at the base of the eyelashes.  Sometimes, there is a white or yellow head of pus. It may or may not drain. DIAGNOSIS  An ophthalmologist will be able to distinguish between a sty and a chalazion and treat the condition appropriately.  TREATMENT   Styes are typically treated with warm packs (compresses) until drainage occurs.  In rare cases, medicines that kill germs (antibiotics) may be prescribed. These antibiotics may be in the form of drops, cream or pills.  If a hard lump has formed, it is generally necessary to do a small incision and remove the hardened contents of the cyst in a minor surgical procedure done in the office.  In suspicious cases, your caregiver may send the contents of the cyst to the lab to be certain that it is not a rare, but dangerous form of cancer of the glands of the eyelid. HOME CARE INSTRUCTIONS   Wash your hands often and dry them with a clean towel. Avoid touching your eyelid. This may spread the infection to other parts of the eye.  Apply heat to your eyelid for 10 to 20 minutes, several times a day, to ease pain and help to heal it faster.  Do not squeeze the sty. Allow it to drain on its own. Wash your eyelid carefully 3 to 4 times per day to remove any pus. SEEK IMMEDIATE MEDICAL CARE IF:   Your eye becomes painful or puffy (swollen).  Your vision  changes.  Your sty does not drain by itself within 3 days.  Your sty comes back within a short period of time, even with treatment.  You have redness (inflammation) around the eye.  You have a fever. Document Released: 02/16/2005 Document Revised: 08/01/2011 Document Reviewed: 08/23/2013 [facility] Patient Information 2015 [place], [place]. This information is not intended to replace advice given to you by your health care provider. Make sure you discuss any questions you have with your health care provider.

## 2014-09-26 ENCOUNTER — Encounter: Payer: Self-pay | Admitting: Internal Medicine

## 2014-10-02 ENCOUNTER — Other Ambulatory Visit: Payer: Self-pay | Admitting: Internal Medicine

## 2014-10-02 ENCOUNTER — Encounter: Payer: Self-pay | Admitting: Internal Medicine

## 2014-10-02 NOTE — Telephone Encounter (Signed)
Not sure when pt should return

## 2014-10-03 NOTE — Telephone Encounter (Signed)
Ok to refill for 6 months  Ov or cpx before runs out in 4-6 months  oras needed

## 2014-10-03 NOTE — Telephone Encounter (Signed)
Sent to the pharmacy by e-scribe.  Pt notified that she will need a cpx in 4-6 months by MyChart.  See message.

## 2014-10-16 ENCOUNTER — Other Ambulatory Visit: Payer: Self-pay | Admitting: Internal Medicine

## 2014-10-16 NOTE — Telephone Encounter (Signed)
Sent to the pharmacy by e-scribe. 

## 2014-11-09 ENCOUNTER — Other Ambulatory Visit: Payer: Self-pay | Admitting: Internal Medicine

## 2014-11-10 NOTE — Telephone Encounter (Signed)
Sent to the pharmacy by e-scribe.  Pt has an appt scheduled for 04/06/15

## 2014-12-06 ENCOUNTER — Other Ambulatory Visit: Payer: Self-pay | Admitting: Internal Medicine

## 2014-12-08 NOTE — Telephone Encounter (Signed)
Sent to the pharmacy by e-scribe.  Pt has upcoming cpx on 04/06/15

## 2014-12-10 ENCOUNTER — Encounter: Payer: Self-pay | Admitting: Internal Medicine

## 2014-12-10 ENCOUNTER — Ambulatory Visit (INDEPENDENT_AMBULATORY_CARE_PROVIDER_SITE_OTHER): Payer: Medicare Other | Admitting: Internal Medicine

## 2014-12-10 VITALS — BP 140/84 | Temp 98.4°F | Ht 65.25 in | Wt 137.8 lb

## 2014-12-10 DIAGNOSIS — I10 Essential (primary) hypertension: Secondary | ICD-10-CM

## 2014-12-10 DIAGNOSIS — R110 Nausea: Secondary | ICD-10-CM | POA: Diagnosis not present

## 2014-12-10 DIAGNOSIS — M791 Myalgia: Secondary | ICD-10-CM

## 2014-12-10 DIAGNOSIS — R519 Headache, unspecified: Secondary | ICD-10-CM

## 2014-12-10 DIAGNOSIS — M7918 Myalgia, other site: Secondary | ICD-10-CM

## 2014-12-10 DIAGNOSIS — R51 Headache: Secondary | ICD-10-CM

## 2014-12-10 DIAGNOSIS — G4481 Hypnic headache: Secondary | ICD-10-CM

## 2014-12-10 DIAGNOSIS — G8929 Other chronic pain: Secondary | ICD-10-CM

## 2014-12-10 DIAGNOSIS — Z79899 Other long term (current) drug therapy: Secondary | ICD-10-CM

## 2014-12-10 NOTE — Patient Instructions (Addendum)
meds for headaches  And pain  could cause some nausea    Advise we get help with the headaches  Trial of omeprazole  OTC   For the time being  .   Take once a day . Will initiated a neuro referral for headaches.    Will plan a abdominal ultrasound   .

## 2014-12-10 NOTE — Progress Notes (Signed)
Pre visit review using our clinic review tool, if applicable. No additional management support is needed unless otherwise documented below in the visit note.  Chief Complaint  Patient presents with  . Nausea    Nausea most days.  Comes and goes.  Back and leg pain ongoing for a few months.  . Back and Leg Pain  . Hypertension    HPI: Patient [name]  comes in today for SDA for  new problems  evaluation.  On going nausea     Anorexia   Eating is not a trigger.  Be decrases intake .  No pain  No pain or vomiting .  No uti.  Ongoing for months  doesnt seem  Related to meds Greasy and heavy foods.  Make worse  No mpain  ROS: See pertinent positives and negatives per HPI. No hematuria or  gu sx with this  No cp sob syncope  Stress  Sleep more difficult and has worse  Right side   taking  Med rescue abou tonce a week  Insurance wont pay for this  And asks for change to tylenol cod hydoc or percocet       Having right buttock pain and radiation again off and on  advil helps some  Uncertain what to do next   BP readings given reviewed   Past Medical History  Diagnosis Date  . GERD (gastroesophageal reflux disease)   . Hyperlipidemia   . Hypertension     echo nl lv function  mild dilitation 2009  . Migraine     hypnic HA eval by Dr. Earley Favor in the past  . Abnormal blood finding     elevated Hg and hct    . 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 Hearing aid worn   . Polycythemia   . Trigeminal neuralgia pain   . Closed head injury 02/01/2011    from syncope and had scalp laceration  neg ct .    Marland Kitchen Syncope 02/01/2011    In shower on vacation  sustained head laceration  8 sutures Had ed visit neg head ct labs and x ray   . 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 Medication side effect 09/02/2010    Poss muscle se of 10 crestor     Family History  Problem Relation Age of Onset  . Ovarian cancer Mother     . Stroke Mother   . Alcohol abuse Father   . Stroke Father   . Seizures Daughter   . Hypertension    . Diabetes Brother   . Cancer Paternal Aunt     leukemia, unknown type  . Colon cancer Neg Hx     History   Social History  . Marital Status: Married    Spouse Name: N/A  . Number of Children: 2  . Years of Education: N/A   Occupational History  .      retired Forensic psychologist   Social History Main Topics  . Smoking status: Never Smoker   . Smokeless tobacco: Never Used  . Alcohol Use: 1.2 oz/week    0 Standard drinks or equivalent, 2 Glasses of wine per week  . Drug Use: No  . Sexual Activity: Not on file   Other Topics Concern  . 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 albuterol (PROVENTIL HFA;VENTOLIN HFA) 108 (90 BASE) MCG/ACT inhaler Inhale 2 puffs into the lungs every 6 (six) hours as needed. 1 Inhaler 2  . amLODipine (NORVASC) 10 MG tablet TAKE ONE TABLET BY MOUTH ONCE DAILY 90 tablet 1  . benazepril (LOTENSIN) 20 MG tablet TAKE ONE TABLET BY MOUTH ONCE DAILY 90 tablet 1  . butalbital-aspirin-caffeine-codeine (FIORINAL WITH CODEINE) 50-325-40-30 MG capsule Take 1 capsule by mouth every 4 (four) hours as needed for migraine. 1-2 by mouth every 4-6 hours as needed max of 6 tabs in 24 hours 30 capsule 0  . chlorthalidone (HYGROTON) 25 MG tablet TAKE ONE TABLET BY MOUTH ONCE DAILY 30 tablet 4  . EPITOL 200 MG tablet TAKE TWO TABLETS BY MOUTH TWICE DAILY 360 tablet 1  . metoprolol (LOPRESSOR) 50 MG tablet TAKE ONE-HALF TABLET BY MOUTH TWICE DAILY 30 tablet 5   No facility-administered medications prior to visit.     EXAM:  BP 140/84 mmHg  Temp(Src) 98.4 F (36.9 C) (Oral)  Ht 5' 5.25" (1.657 m)  Wt 137 lb 12.8 oz (62.506 kg)  BMI 22.77 kg/m2  Body mass index is 22.77  kg/(m^2).  GENERAL: vitals reviewed and listed above, alert, oriented, appears well hydrated and in no acute distress HEENT: atraumatic, conjunctiva  clear, no obvious abnormalities on inspection of external nose and ears OP : no lesion edema or exudate  NECK: no obvious masses on inspection palpation  LUNGS: clear to auscultation bilaterally, no wheezes, rales or rhonchi, good air movement Abdomen:  Sof,t normal bowel sounds without hepatosplenomegaly, no guarding rebound or masses no CVA tenderness CV: HRRR, no clubbing cyanosis slight peripheral edema nl cap refill  MS: moves all extremities without noticeable focal  Abnormality gait normal today  No point tenderness PSYCH: pleasant and cooperative, no obvious depression or anxiety  BP Readings from Last 3 Encounters:  12/10/14 140/84  09/15/14 100/72  07/29/14 126/70   Wt Readings from Last 3 Encounters:  12/10/14 137 lb 12.8 oz (62.506 kg)  09/15/14 140 lb 8 oz (63.73 kg)  07/29/14 138 lb 3.2 oz (62.687 kg)   Lab Results  Component Value Date   WBC 5.6 05/27/2014   HGB 15.2* 05/27/2014   HCT 45.9 05/27/2014   PLT 242.0 05/27/2014   GLUCOSE 84 08/19/2014   CHOL 230* 11/11/2013   TRIG 51.0 11/11/2013   HDL 117.70 11/11/2013   LDLDIRECT 135.9 10/16/2012   LDLCALC 102* 11/11/2013   ALT 25 11/11/2013   AST 27 11/11/2013   NA 135 08/19/2014   K 4.2 08/19/2014   CL 99 08/19/2014   CREATININE 0.99 08/19/2014   BUN 21 08/19/2014   CO2 31 08/19/2014   TSH 0.66 11/11/2013  \ ASSESSMENT AND PLAN:  Discussed the following assessment and plan:  Nausea without vomiting - Plan: US Abdomen Complete, Ambulatory referral to Neurology  Hypnic headache - Plan: Ambulatory referral to Neurology  Chronic recurrent  intractable headache, unspecified headache type - right sided worse in am  aggravation recently ad different  - Plan: Ambulatory referral to Neurology  Essential hypertension - mostly adequate control  byu readings 120-  140 range    Right buttock pain with radiation - off and   on for months  advil  helps. - Plan: Ambulatory referral to Orthopedic Surgery Has  sleep  Nausea  Problems and taking   excedrin migraine.    number of symptoms some which could be side effects of medicine she is taking for headaches. Her primary complaint is intermittent nausea over the last 2 months without associated weight loss or vomiting or change in bowel habits. However her headache symptoms are progressing and worse usually right sided upper in the morning. She uses rescue medication occasionally that has peaked At all caffeine and codeine in it. She takes Advil on other days. No history of ulcers or gallbladder problems.   All of this in the context of right buttocks radicular versus other radiating pain down her leg this unrelated but medication could been giving her side effects. No alarm symptoms on exam today.    plan neurology headache consult   abdominal ultrasound to check gallbladder pancreas liver etc.  Orthopedic consult in regard to her right buttock leg predicament that hasn't resolved with time and conservative treatment. Add omeprazole in interim and then plan fu  Prolonged management  Visit  And coordination of care today time 40 minutes   -Patient advised to return or notify health care team  if symptoms worsen ,persist or new concerns arise.  Patient Instructions  meds for headaches  And pain  could cause some nausea    Advise we get help with the headaches  Trial of omeprazole  OTC   For the time being  .   Take once a day . Will initiated a neuro referral for headaches.    Will plan a abdominal ultrasound   .       [name]. Koji Niehoff M.D.

## 2015-02-05 ENCOUNTER — Other Ambulatory Visit (INDEPENDENT_AMBULATORY_CARE_PROVIDER_SITE_OTHER): Payer: Medicare Other

## 2015-02-05 ENCOUNTER — Ambulatory Visit (INDEPENDENT_AMBULATORY_CARE_PROVIDER_SITE_OTHER): Payer: Medicare Other | Admitting: Neurology

## 2015-02-05 ENCOUNTER — Encounter: Payer: Self-pay | Admitting: Neurology

## 2015-02-05 VITALS — BP 120/76 | HR 58 | Ht 66.0 in | Wt 137.8 lb

## 2015-02-05 DIAGNOSIS — R51 Headache: Secondary | ICD-10-CM

## 2015-02-05 DIAGNOSIS — R519 Headache, unspecified: Secondary | ICD-10-CM

## 2015-02-05 LAB — SEDIMENTATION RATE: Sed Rate: 2 mm/hr (ref 0–22)

## 2015-02-05 MED ORDER — GABAPENTIN 300 MG PO CAPS: 300.0000 mg | ORAL_CAPSULE | Freq: Every day | ORAL | Status: DC

## 2015-02-05 NOTE — Patient Instructions (Signed)
Migraine Recommendations: 1.  Start gabapentin 332m at bedtime.  Call in 4 weeks with update and we can adjust dose if needed. 2.  Take Excedrin at earliest onset of headache 3.  Limit use of pain relievers to no more than 2 days out of the week.  These medications include acetaminophen, ibuprofen, triptans and narcotics.  This will help reduce risk of rebound headaches. 4.  Be aware of common food triggers such as processed sweets, processed foods with nitrites (such as deli meat, hot dogs, sausages), foods with MSG, alcohol (such as wine), chocolate, certain cheeses, certain fruits (dried fruits, some citrus fruit), vinegar, diet soda. 4.  Avoid caffeine 5.  Routine exercise 6.  Proper sleep hygiene 7.  Stay adequately hydrated with water 8.  Keep a headache diary. 9.  Maintain proper stress management. 10.  Do not skip meals. 11.  Will check Sed Rate 12.  CALL IN 4 WEEKS WITH UPDATE.  Follow up in 3 months.

## 2015-02-05 NOTE — Progress Notes (Signed)
NEUROLOGY CONSULTATION NOTE  [name] MRN: [id number] DOB: 05-May-1941  Referring provider: Dr. Regis Bill Primary care provider: Dr. Regis Bill  Reason for consult:  headache  HISTORY OF PRESENT ILLNESS: [name] is a 74 year old right-handed female with hypertension, hyperlipidemia, trigeminal neuralgia, migraine and GERD who presents for headaches.  History obtained by patient and PCP note.  MRI report reviewed.  Onset:  She fell and hit the right front of her head about a year ago.  About 5 months later, she developed current headache Location:  Right frontal/maxillary/retro-orbital Quality:  Throbbing, pounding Intensity:  8/10 Aura:  no Prodrome:  no Associated symptoms:  No nausea, photophobia, phonophobia Duration:  She wakes up with them and it usually resolves with Excedrin Frequency:  2 to 3 days per week Triggers/exacerbating factors:  none Relieving factors:  none Activity:  Unable to function  Past abortive medication:  none Past preventative medication:  none Other past therapy:  none  Current abortive medication:  Excedrin.  Will take butalbital-aspirin-caffeine-codeine if Excedrin ineffective (only uses 2-3 times a month) Antihypertensive medications:  metoprolol 35m twice daily, amlodipine 115m benzapril Antidepressant medications:  none Anticonvulsant medications:  Epitol 40054mwice daily Vitamins/Herbal/Supplements:  Melatonin 38m37mily Other therapy:  none  She had an MRI of the brain with and without contrast on 12/07/11 to evaluate etiology of trigeminal neuralgia.  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   She has history of migraines and tension type headaches since childhood.  Typical migraines associated with visual aura, numbness and tingling of arms, and nausea.  She also has right sided trigeminal neuralgia, for which she takes Epitol 400mg66mce daily.  [facility] Past Medical History  Diagnosis Date  . GERD (gastroesophageal reflux disease)   . Hyperlipidemia   . Hypertension     echo nl lv function  mild dilitation 2009  . Migraine     hypnic HA eval by Dr. AdelmEarley Favorhe past  . Abnormal blood finding     elevated Hg and hct    . 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 Hearing aid worn   . Polycythemia   . Trigeminal neuralgia pain   . Closed head injury 02/01/2011    from syncope and had scalp laceration  neg ct .    . SynMarland Kitchenope 02/01/2011    In shower on vacation  sustained head laceration  8 sutures Had ed visit neg head ct labs and x ray   . ADJ DISORDER WITH MIXED ANXIETY & DEPRESSED MOOD 03/03/2010    Qualifier: Diagnosis of  By: PanosRegis BillWandaStandley BrookingMedication side effect 09/02/2010    Poss muscle se of 10 crestor     PAST SURGICAL HISTORY: Past Surgical History  Procedure Laterality Date  . Doppler echocardiography  2009    nl lv function mild lv dilitation  . Abdominal hysterectomy  2002    tubal  . Cardiac catheterization  2000    chest pains neg  . Rt shoulder surgery    . Oophorectomy  2002  . Appendectomy  2002  . Craniotomy  July 19 13    nerve decompression right trigeminal   . Colonoscopy      multiple    MEDICATIONS: Current Outpatient Prescriptions on File Prior to Visit  Medication Sig Dispense Refill  .  albuterol (PROVENTIL HFA;VENTOLIN HFA) 108 (90 BASE) MCG/ACT inhaler Inhale 2 puffs into the lungs every 6 (six) hours as needed. 1 Inhaler 2  . amLODipine (NORVASC) 10 MG tablet TAKE ONE TABLET BY MOUTH ONCE DAILY 90 tablet 1  . benazepril (LOTENSIN) 20 MG tablet TAKE ONE TABLET BY MOUTH ONCE DAILY 90 tablet 1  . butalbital-aspirin-caffeine-codeine (FIORINAL WITH CODEINE) 50-325-40-30 MG capsule Take 1 capsule by mouth every 4 (four) hours as needed for migraine. 1-2 by mouth every 4-6 hours as needed max of 6 tabs in 24 hours 30 capsule 0  .  chlorthalidone (HYGROTON) 25 MG tablet TAKE ONE TABLET BY MOUTH ONCE DAILY 30 tablet 4  . EPITOL 200 MG tablet TAKE TWO TABLETS BY MOUTH TWICE DAILY 360 tablet 1  . metoprolol (LOPRESSOR) 50 MG tablet TAKE ONE-HALF TABLET BY MOUTH TWICE DAILY 30 tablet 5   No current facility-administered medications on file prior to visit.    ALLERGIES: Allergies  Allergen Reactions  . Sulfamethoxazole-Trimethoprim     REACTION: unspecified    FAMILY HISTORY: Family History  Problem Relation Age of Onset  . Ovarian cancer Mother   . Stroke Mother   . Alcohol abuse Father   . Stroke Father   . Seizures Daughter   . Hypertension    . Diabetes Brother   . Cancer Paternal Aunt     leukemia, unknown type  . Colon cancer Neg Hx     SOCIAL HISTORY: Social History   Social History  . Marital Status: Married    Spouse Name: N/A  . Number of Children: 2  . Years of Education: N/A   Occupational History  .      retired Forensic psychologist   Social History Main Topics  . Smoking status: Never Smoker   . Smokeless tobacco: Never Used  . Alcohol Use: 1.2 oz/week    0 Standard drinks or equivalent, 2 Glasses of wine per week  . Drug Use: No  . Sexual Activity: Not on file   Other Topics Concern  . Not on file   Social History Narrative   Married   HH of 2-3 (god daughter)   Pets 2 dogs   Non smoker    Child is a physician   G2P2                REVIEW OF SYSTEMS: Constitutional: No fevers, chills, or sweats, no generalized fatigue, change in appetite Eyes: No visual changes, double vision, eye pain Ear, nose and throat: No hearing loss, ear pain, nasal congestion, sore throat Cardiovascular: No chest pain, palpitations Respiratory:  No shortness of breath at rest or with exertion, wheezes GastrointestinaI: No nausea, vomiting, diarrhea, abdominal pain, fecal incontinence Genitourinary:  No dysuria, urinary retention or frequency Musculoskeletal:  No neck pain, back  pain Integumentary: No rash, pruritus, skin lesions Neurological: as above Psychiatric: No depression, insomnia, anxiety Endocrine: No palpitations, fatigue, diaphoresis, mood swings, change in appetite, change in weight, increased thirst Hematologic/Lymphatic:  No anemia, purpura, petechiae. Allergic/Immunologic: no itchy/runny eyes, nasal congestion, recent allergic reactions, rashes  PHYSICAL EXAM: Filed Vitals:   02/05/15 1021  BP: 120/76  Pulse: 58   General: No acute distress.  Patient appears well-groomed.   Head:  Normocephalic/atraumatic Eyes:  fundi unremarkable, without vessel changes, exudates, hemorrhages or papilledema. Neck: supple, no paraspinal tenderness, full range of motion Back: No paraspinal tenderness Heart: regular rate and rhythm Lungs: Clear to auscultation bilaterally. Vascular: No carotid bruits. Neurological Exam: Mental status:  alert and oriented to person, place, and time, recent and remote memory intact, fund of knowledge intact, attention and concentration intact, speech fluent and not dysarthric, language intact. Cranial nerves: CN I: not tested CN II: pupils equal, round and reactive to light, visual fields intact, fundi unremarkable, without vessel changes, exudates, hemorrhages or papilledema. CN III, IV, VI:  full range of motion, no nystagmus, no ptosis CN V: facial sensation intact CN VII: upper and lower face symmetric CN VIII: hearing intact CN IX, X: gag intact, uvula midline CN XI: sternocleidomastoid and trapezius muscles intact CN XII: tongue midline Bulk & Tone: normal, no fasciculations. Motor:  5/5 throughout Sensation: temperature and vibration sensation intact. Deep Tendon Reflexes:  2+ throughout, toes downgoing.  Finger to nose testing:  Without dysmetria.  Heel to shin:  Without dysmetria.  Gait:  Normal station and stride.  Able to turn and tandem walk. Romberg negative.  IMPRESSION: Right sided unilateral headache in  74 year old patient.  Differential diagnosis includes migraine or possibly hypnic headache (considering it always occurs when she wakes up, but not sure if the headache itself wakes her up).  PLAN: 1.  Will start gabapentin 341m at bedtime.  She will call in 4 weeks with update. 2.  Excedrin as needed. Advised to continue limiting Fiorinal 3. Since she is older than 89and she says this is a new headache, will check Sed Rate.  4. Follow up in 3 months  Thank you for allowing me to take part in the care of this patient.  AMetta Clines DO  CC:  WShanon Ace MD

## 2015-02-06 ENCOUNTER — Telehealth: Payer: Self-pay | Admitting: Neurology

## 2015-02-06 NOTE — Telephone Encounter (Signed)
-----   Message from Pieter Partridge, DO sent at 02/06/2015  7:43 AM EDT ----- Sed Rate is normal

## 2015-02-06 NOTE — Telephone Encounter (Signed)
Patient's husband made aware labs normal. They will call with any questions.

## 2015-02-27 ENCOUNTER — Telehealth: Payer: Self-pay | Admitting: Neurology

## 2015-02-27 NOTE — Telephone Encounter (Signed)
FYI...  Patient would like to continue the gabapentin because it helps her to sleep but the Excedrin really works for her migraines.

## 2015-02-27 NOTE — Telephone Encounter (Signed)
Pt called to report that the Gabapentin isn't working for her headaches in the mornings/and double vision//has been taking Excedrin Migrain in the Mornings also/ call back @ [phone]

## 2015-03-11 ENCOUNTER — Inpatient Hospital Stay (HOSPITAL_COMMUNITY)
Admission: EM | Admit: 2015-03-11 | Discharge: 2015-03-14 | DRG: 872 | Disposition: A | Discharge status: Home / Self Care | Payer: Medicare Other | Attending: Internal Medicine | Admitting: Internal Medicine

## 2015-03-11 ENCOUNTER — Encounter (HOSPITAL_COMMUNITY): Payer: Self-pay | Admitting: *Deleted

## 2015-03-11 DIAGNOSIS — Z806 Family history of leukemia: Secondary | ICD-10-CM

## 2015-03-11 DIAGNOSIS — Z833 Family history of diabetes mellitus: Secondary | ICD-10-CM

## 2015-03-11 DIAGNOSIS — N179 Acute kidney failure, unspecified: Secondary | ICD-10-CM | POA: Diagnosis present

## 2015-03-11 DIAGNOSIS — A419 Sepsis, unspecified organism: Secondary | ICD-10-CM | POA: Diagnosis not present

## 2015-03-11 DIAGNOSIS — K8020 Calculus of gallbladder without cholecystitis without obstruction: Secondary | ICD-10-CM | POA: Diagnosis present

## 2015-03-11 DIAGNOSIS — K219 Gastro-esophageal reflux disease without esophagitis: Secondary | ICD-10-CM | POA: Diagnosis present

## 2015-03-11 DIAGNOSIS — Z823 Family history of stroke: Secondary | ICD-10-CM

## 2015-03-11 DIAGNOSIS — Z820 Family history of epilepsy and other diseases of the nervous system: Secondary | ICD-10-CM

## 2015-03-11 DIAGNOSIS — Z8041 Family history of malignant neoplasm of ovary: Secondary | ICD-10-CM

## 2015-03-11 DIAGNOSIS — E876 Hypokalemia: Secondary | ICD-10-CM

## 2015-03-11 DIAGNOSIS — N10 Acute tubulo-interstitial nephritis: Secondary | ICD-10-CM | POA: Diagnosis present

## 2015-03-11 DIAGNOSIS — E860 Dehydration: Secondary | ICD-10-CM | POA: Diagnosis not present

## 2015-03-11 DIAGNOSIS — Z800 Family history of malignant neoplasm of digestive organs: Secondary | ICD-10-CM

## 2015-03-11 DIAGNOSIS — I10 Essential (primary) hypertension: Secondary | ICD-10-CM | POA: Diagnosis present

## 2015-03-11 DIAGNOSIS — Z811 Family history of alcohol abuse and dependence: Secondary | ICD-10-CM

## 2015-03-11 DIAGNOSIS — N12 Tubulo-interstitial nephritis, not specified as acute or chronic: Secondary | ICD-10-CM | POA: Diagnosis present

## 2015-03-11 DIAGNOSIS — G500 Trigeminal neuralgia: Secondary | ICD-10-CM | POA: Diagnosis present

## 2015-03-11 DIAGNOSIS — D696 Thrombocytopenia, unspecified: Secondary | ICD-10-CM | POA: Diagnosis present

## 2015-03-11 DIAGNOSIS — E875 Hyperkalemia: Secondary | ICD-10-CM | POA: Diagnosis present

## 2015-03-11 NOTE — ED Notes (Signed)
The abdominal pain started last night and woke up this morning with no pain. Pt. Was watching the debate tonight and started feeling worse with nausea and vomiting and diarrhea. EMS gave 32m of IV zofran and pt has relief

## 2015-03-12 ENCOUNTER — Inpatient Hospital Stay (HOSPITAL_COMMUNITY): Payer: Medicare Other

## 2015-03-12 ENCOUNTER — Emergency Department (HOSPITAL_COMMUNITY): Payer: Medicare Other

## 2015-03-12 ENCOUNTER — Encounter (HOSPITAL_COMMUNITY): Payer: Self-pay

## 2015-03-12 DIAGNOSIS — Z823 Family history of stroke: Secondary | ICD-10-CM | POA: Diagnosis not present

## 2015-03-12 DIAGNOSIS — G500 Trigeminal neuralgia: Secondary | ICD-10-CM | POA: Diagnosis present

## 2015-03-12 DIAGNOSIS — N12 Tubulo-interstitial nephritis, not specified as acute or chronic: Secondary | ICD-10-CM | POA: Diagnosis not present

## 2015-03-12 DIAGNOSIS — N179 Acute kidney failure, unspecified: Secondary | ICD-10-CM

## 2015-03-12 DIAGNOSIS — Z820 Family history of epilepsy and other diseases of the nervous system: Secondary | ICD-10-CM | POA: Diagnosis not present

## 2015-03-12 DIAGNOSIS — E876 Hypokalemia: Secondary | ICD-10-CM | POA: Diagnosis present

## 2015-03-12 DIAGNOSIS — Z811 Family history of alcohol abuse and dependence: Secondary | ICD-10-CM | POA: Diagnosis not present

## 2015-03-12 DIAGNOSIS — Z806 Family history of leukemia: Secondary | ICD-10-CM | POA: Diagnosis not present

## 2015-03-12 DIAGNOSIS — D696 Thrombocytopenia, unspecified: Secondary | ICD-10-CM | POA: Diagnosis present

## 2015-03-12 DIAGNOSIS — N10 Acute tubulo-interstitial nephritis: Secondary | ICD-10-CM | POA: Diagnosis present

## 2015-03-12 DIAGNOSIS — Z833 Family history of diabetes mellitus: Secondary | ICD-10-CM | POA: Diagnosis not present

## 2015-03-12 DIAGNOSIS — A419 Sepsis, unspecified organism: Secondary | ICD-10-CM | POA: Diagnosis present

## 2015-03-12 DIAGNOSIS — Z8041 Family history of malignant neoplasm of ovary: Secondary | ICD-10-CM | POA: Diagnosis not present

## 2015-03-12 DIAGNOSIS — I10 Essential (primary) hypertension: Secondary | ICD-10-CM | POA: Diagnosis present

## 2015-03-12 DIAGNOSIS — K219 Gastro-esophageal reflux disease without esophagitis: Secondary | ICD-10-CM | POA: Diagnosis present

## 2015-03-12 DIAGNOSIS — E860 Dehydration: Secondary | ICD-10-CM | POA: Diagnosis present

## 2015-03-12 DIAGNOSIS — K8020 Calculus of gallbladder without cholecystitis without obstruction: Secondary | ICD-10-CM | POA: Diagnosis present

## 2015-03-12 DIAGNOSIS — Z800 Family history of malignant neoplasm of digestive organs: Secondary | ICD-10-CM | POA: Diagnosis not present

## 2015-03-12 DIAGNOSIS — E875 Hyperkalemia: Secondary | ICD-10-CM | POA: Diagnosis present

## 2015-03-12 HISTORY — DX: Acute kidney failure, unspecified: N17.9

## 2015-03-12 HISTORY — DX: Tubulo-interstitial nephritis, not specified as acute or chronic: N12

## 2015-03-12 LAB — LIPASE, BLOOD: Lipase: 29 U/L (ref 11–51)

## 2015-03-12 LAB — CBC WITH DIFFERENTIAL/PLATELET
Basophils Absolute: 0 10*3/uL (ref 0.0–0.1)
Basophils Absolute: 0 10*3/uL (ref 0.0–0.1)
Basophils Relative: 0 %
Basophils Relative: 0 %
Eosinophils Absolute: 0 10*3/uL (ref 0.0–0.7)
Eosinophils Absolute: 0 10*3/uL (ref 0.0–0.7)
Eosinophils Relative: 0 %
Eosinophils Relative: 0 %
HCT: 42.6 % (ref 36.0–46.0)
HCT: 45.7 % (ref 36.0–46.0)
Hemoglobin: 14.7 g/dL (ref 12.0–15.0)
Hemoglobin: 15.5 g/dL — ABNORMAL HIGH (ref 12.0–15.0)
Lymphocytes Relative: 2 %
Lymphocytes Relative: 3 %
Lymphs Abs: 0.2 10*3/uL — ABNORMAL LOW (ref 0.7–4.0)
Lymphs Abs: 0.4 10*3/uL — ABNORMAL LOW (ref 0.7–4.0)
MCH: 31.1 pg (ref 26.0–34.0)
MCH: 30.9 pg (ref 26.0–34.0)
MCHC: 34.5 g/dL (ref 30.0–36.0)
MCHC: 33.9 g/dL (ref 30.0–36.0)
MCV: 90.1 fL (ref 78.0–100.0)
MCV: 91.2 fL (ref 78.0–100.0)
Monocytes Absolute: 0.3 10*3/uL (ref 0.1–1.0)
Monocytes Absolute: 0.2 10*3/uL (ref 0.1–1.0)
Monocytes Relative: 4 %
Monocytes Relative: 1 %
Neutro Abs: 7 10*3/uL (ref 1.7–7.7)
Neutro Abs: 12.5 10*3/uL — ABNORMAL HIGH (ref 1.7–7.7)
Neutrophils Relative %: 94 %
Neutrophils Relative %: 96 %
Platelets: 121 10*3/uL — ABNORMAL LOW (ref 150–400)
Platelets: 139 10*3/uL — ABNORMAL LOW (ref 150–400)
RBC: 4.73 MIL/uL (ref 3.87–5.11)
RBC: 5.01 MIL/uL (ref 3.87–5.11)
RDW: 12.6 % (ref 11.5–15.5)
RDW: 12.8 % (ref 11.5–15.5)
WBC: 7.5 10*3/uL (ref 4.0–10.5)
WBC: 13 10*3/uL — ABNORMAL HIGH (ref 4.0–10.5)

## 2015-03-12 LAB — COMPREHENSIVE METABOLIC PANEL
ALT: 29 U/L (ref 14–54)
AST: 33 U/L (ref 15–41)
Albumin: 3.3 g/dL — ABNORMAL LOW (ref 3.5–5.0)
Alkaline Phosphatase: 73 U/L (ref 38–126)
Anion gap: 10 (ref 5–15)
BUN: 24 mg/dL — ABNORMAL HIGH (ref 6–20)
CO2: 31 mmol/L (ref 22–32)
Calcium: 8.8 mg/dL — ABNORMAL LOW (ref 8.9–10.3)
Chloride: 92 mmol/L — ABNORMAL LOW (ref 101–111)
Creatinine, Ser: 1.41 mg/dL — ABNORMAL HIGH (ref 0.44–1.00)
GFR calc Af Amer: 41 mL/min — ABNORMAL LOW (ref >60)
GFR calc non Af Amer: 35 mL/min — ABNORMAL LOW (ref >60)
Glucose, Bld: 116 mg/dL — ABNORMAL HIGH (ref 65–99)
Potassium: 3 mmol/L — ABNORMAL LOW (ref 3.5–5.1)
Sodium: 133 mmol/L — ABNORMAL LOW (ref 135–145)
Total Bilirubin: 0.6 mg/dL (ref 0.3–1.2)
Total Protein: 5.7 g/dL — ABNORMAL LOW (ref 6.5–8.1)

## 2015-03-12 LAB — URINALYSIS, ROUTINE W REFLEX MICROSCOPIC
Bilirubin Urine: NEGATIVE
Glucose, UA: NEGATIVE mg/dL
Ketones, ur: 15 mg/dL — AB
Nitrite: NEGATIVE
Protein, ur: 100 mg/dL — AB
Specific Gravity, Urine: 1.02 (ref 1.005–1.030)
Urobilinogen, UA: 0.2 mg/dL (ref 0.0–1.0)
pH: 7.5 (ref 5.0–8.0)

## 2015-03-12 LAB — URINE MICROSCOPIC-ADD ON

## 2015-03-12 LAB — BASIC METABOLIC PANEL
Anion gap: 13 (ref 5–15)
BUN: 17 mg/dL (ref 6–20)
CO2: 29 mmol/L (ref 22–32)
Calcium: 9.2 mg/dL (ref 8.9–10.3)
Chloride: 94 mmol/L — ABNORMAL LOW (ref 101–111)
Creatinine, Ser: 1.42 mg/dL — ABNORMAL HIGH (ref 0.44–1.00)
GFR calc Af Amer: 41 mL/min — ABNORMAL LOW (ref >60)
GFR calc non Af Amer: 35 mL/min — ABNORMAL LOW (ref >60)
Glucose, Bld: 124 mg/dL — ABNORMAL HIGH (ref 65–99)
Potassium: 3.6 mmol/L (ref 3.5–5.1)
Sodium: 136 mmol/L (ref 135–145)

## 2015-03-12 LAB — LACTIC ACID, PLASMA
Lactic Acid, Venous: 2.7 mmol/L (ref 0.5–2.0)
Lactic Acid, Venous: 2 mmol/L (ref 0.5–2.0)

## 2015-03-12 LAB — PROTIME-INR
INR: 1.23 (ref 0.00–1.49)
Prothrombin Time: 15.6 seconds — ABNORMAL HIGH (ref 11.6–15.2)

## 2015-03-12 LAB — APTT: aPTT: 34 seconds (ref 24–37)

## 2015-03-12 LAB — PROCALCITONIN: Procalcitonin: 4.59 ng/mL

## 2015-03-12 LAB — I-STAT TROPONIN, ED: Troponin i, poc: 0.02 ng/mL (ref 0.00–0.08)

## 2015-03-12 LAB — MAGNESIUM: Magnesium: 2 mg/dL (ref 1.7–2.4)

## 2015-03-12 MED ORDER — ALBUTEROL SULFATE (2.5 MG/3ML) 0.083% IN NEBU: 2.5000 mg | INHALATION_SOLUTION | Freq: Four times a day (QID) | RESPIRATORY_TRACT | Status: DC | PRN

## 2015-03-12 MED ORDER — SODIUM CHLORIDE 0.9 % IV BOLUS (SEPSIS)
500.0000 mL | Freq: Once | INTRAVENOUS | Status: AC
  Administered 2015-03-12: 500 mL via INTRAVENOUS

## 2015-03-12 MED ORDER — ACETAMINOPHEN 650 MG RE SUPP
650.0000 mg | Freq: Four times a day (QID) | RECTAL | Status: DC | PRN
  Administered 2015-03-12: 650 mg via RECTAL
  Filled 2015-03-12: qty 1

## 2015-03-12 MED ORDER — HYDRALAZINE HCL 20 MG/ML IJ SOLN
10.0000 mg | INTRAMUSCULAR | Status: DC | PRN
  Administered 2015-03-13: 10 mg via INTRAVENOUS
  Filled 2015-03-12: qty 1

## 2015-03-12 MED ORDER — BENAZEPRIL HCL 20 MG PO TABS: 20.0000 mg | ORAL_TABLET | Freq: Every day | ORAL | Status: DC

## 2015-03-12 MED ORDER — POTASSIUM CHLORIDE 10 MEQ/100ML IV SOLN
10.0000 meq | INTRAVENOUS | Status: AC
  Administered 2015-03-12 (×2): 10 meq via INTRAVENOUS
  Filled 2015-03-12 (×3): qty 100

## 2015-03-12 MED ORDER — DEXTROSE 5 % IV SOLN
1.0000 g | Freq: Once | INTRAVENOUS | Status: AC
  Administered 2015-03-12: 1 g via INTRAVENOUS
  Filled 2015-03-12: qty 10

## 2015-03-12 MED ORDER — ONDANSETRON HCL 4 MG PO TABS: 4.0000 mg | ORAL_TABLET | Freq: Four times a day (QID) | ORAL | Status: DC | PRN

## 2015-03-12 MED ORDER — MORPHINE SULFATE (PF) 2 MG/ML IV SOLN
1.0000 mg | INTRAVENOUS | Status: DC | PRN
  Administered 2015-03-13 – 2015-03-14 (×2): 1 mg via INTRAVENOUS
  Filled 2015-03-12 (×2): qty 1

## 2015-03-12 MED ORDER — ONDANSETRON HCL 4 MG/2ML IJ SOLN
4.0000 mg | Freq: Four times a day (QID) | INTRAMUSCULAR | Status: DC | PRN
  Administered 2015-03-12 – 2015-03-13 (×2): 4 mg via INTRAVENOUS
  Filled 2015-03-12 (×2): qty 2

## 2015-03-12 MED ORDER — POTASSIUM CHLORIDE CRYS ER 20 MEQ PO TBCR
40.0000 meq | EXTENDED_RELEASE_TABLET | Freq: Once | ORAL | Status: AC
Start: 2015-03-12 — End: 2015-03-12
  Administered 2015-03-12: 40 meq via ORAL
  Filled 2015-03-12: qty 2

## 2015-03-12 MED ORDER — METOPROLOL TARTRATE 25 MG PO TABS
25.0000 mg | ORAL_TABLET | Freq: Two times a day (BID) | ORAL | Status: DC
  Administered 2015-03-12 – 2015-03-14 (×5): 25 mg via ORAL
  Filled 2015-03-12 (×5): qty 1

## 2015-03-12 MED ORDER — GABAPENTIN 300 MG PO CAPS
300.0000 mg | ORAL_CAPSULE | Freq: Every day | ORAL | Status: DC
  Administered 2015-03-12 – 2015-03-13 (×2): 300 mg via ORAL
  Filled 2015-03-12 (×2): qty 1

## 2015-03-12 MED ORDER — IOHEXOL 300 MG/ML  SOLN
80.0000 mL | Freq: Once | INTRAMUSCULAR | Status: AC | PRN
  Administered 2015-03-12: 80 mL via INTRAVENOUS

## 2015-03-12 MED ORDER — DEXTROSE 5 % IV SOLN: 1.0000 g | INTRAVENOUS | Status: DC

## 2015-03-12 MED ORDER — ONDANSETRON HCL 4 MG/2ML IJ SOLN
4.0000 mg | Freq: Once | INTRAMUSCULAR | Status: AC
  Administered 2015-03-12: 4 mg via INTRAVENOUS
  Filled 2015-03-12: qty 2

## 2015-03-12 MED ORDER — BUTALBITAL-APAP-CAFFEINE 50-325-40 MG PO TABS
1.0000 | ORAL_TABLET | ORAL | Status: DC | PRN
  Administered 2015-03-13 – 2015-03-14 (×4): 1 via ORAL
  Filled 2015-03-12 (×4): qty 1

## 2015-03-12 MED ORDER — CARBAMAZEPINE 200 MG PO TABS
400.0000 mg | ORAL_TABLET | Freq: Two times a day (BID) | ORAL | Status: DC
  Administered 2015-03-12 – 2015-03-14 (×5): 400 mg via ORAL
  Filled 2015-03-12 (×5): qty 2

## 2015-03-12 MED ORDER — SODIUM CHLORIDE 0.9 % IV BOLUS (SEPSIS)
1000.0000 mL | INTRAVENOUS | Status: AC
  Administered 2015-03-12 (×2): 1000 mL via INTRAVENOUS

## 2015-03-12 MED ORDER — SODIUM CHLORIDE 0.9 % IV SOLN
INTRAVENOUS | Status: DC
  Administered 2015-03-12 – 2015-03-13 (×3): via INTRAVENOUS

## 2015-03-12 MED ORDER — CEFTRIAXONE SODIUM 1 G IJ SOLR
1.0000 g | Freq: Once | INTRAMUSCULAR | Status: AC
  Administered 2015-03-12: 1 g via INTRAVENOUS
  Filled 2015-03-12: qty 10

## 2015-03-12 MED ORDER — AMLODIPINE BESYLATE 10 MG PO TABS
10.0000 mg | ORAL_TABLET | Freq: Every day | ORAL | Status: DC
  Administered 2015-03-12 – 2015-03-14 (×3): 10 mg via ORAL
  Filled 2015-03-12 (×3): qty 1

## 2015-03-12 MED ORDER — BENAZEPRIL HCL 20 MG PO TABS
20.0000 mg | ORAL_TABLET | Freq: Every day | ORAL | Status: DC
Start: 2015-03-12 — End: 2015-03-12
  Filled 2015-03-12: qty 1

## 2015-03-12 MED ORDER — DEXTROSE 5 % IV SOLN
2.0000 g | INTRAVENOUS | Status: DC
  Administered 2015-03-13 – 2015-03-14 (×2): 2 g via INTRAVENOUS
  Filled 2015-03-12 (×2): qty 2

## 2015-03-12 MED ORDER — MORPHINE SULFATE (PF) 4 MG/ML IV SOLN
4.0000 mg | Freq: Once | INTRAVENOUS | Status: AC
  Administered 2015-03-12: 4 mg via INTRAVENOUS
  Filled 2015-03-12: qty 1

## 2015-03-12 MED ORDER — ENOXAPARIN SODIUM 40 MG/0.4ML ~~LOC~~ SOLN
40.0000 mg | Freq: Every day | SUBCUTANEOUS | Status: DC
  Administered 2015-03-12 – 2015-03-14 (×3): 40 mg via SUBCUTANEOUS
  Filled 2015-03-12 (×3): qty 0.4

## 2015-03-12 MED ORDER — ACETAMINOPHEN 325 MG PO TABS
650.0000 mg | ORAL_TABLET | Freq: Four times a day (QID) | ORAL | Status: DC | PRN
  Administered 2015-03-12: 650 mg via ORAL

## 2015-03-12 NOTE — ED Provider Notes (Signed)
CSN: [id number]   Arrival date & time 03/11/15 2342  History  By signing my name below, I, [name], attest that this documentation has been prepared under the direction and in the presence of Angela Rice, MD. Electronically Signed: Altamease Barrera, ED Scribe. 03/12/2015. 3:49 AM.  Chief Complaint  Patient presents with  . Abdominal Pain    HPI The history is provided by the patient. No language interpreter was used.   Brought in by EMS, [name] is a 74 y.o. female with PMHx of GERD who presents to the Emergency Department complaining of new and diffuse abdominal pain with onset yesterday. Pt has had 2 episodes of the constant pain stating that it was better this morning when she woke up but returned today around 5 PM. Her last meal was soup around 6 PM and the pain worsened after eating. She rates the pain 7/10 in severity. Associated symptoms include chills, nausea, 1 episode of emesis yesterday, and night sweats. She believed that her symptoms were caused by constipation and drank a bottle of Magnesium Citrate yesterday. After the laxative she had 3-4 loose and non-bloody stools.  She has had no vomiting or loose stools today.  Pt denies chest pain, coughing, dysuria, increased frequency, and new difficulty urinating. Surgical history is significant for appendectomy and oophorectomy.   Past Medical History  Diagnosis Date  . GERD (gastroesophageal reflux disease)   . Hyperlipidemia   . Hypertension     echo nl lv function  mild dilitation 2009  . Migraine     hypnic HA eval by Dr. Earley Favor in the past  . Abnormal blood finding     elevated Hg and hct    . 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 Hearing aid worn   . Polycythemia   . Trigeminal neuralgia pain   . Closed head injury 02/01/2011    from syncope and had scalp laceration  neg ct .    Marland Kitchen Syncope 02/01/2011    In shower on vacation  sustained head laceration  8 sutures Had ed visit neg head ct labs and x ray   .  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 Medication side effect 09/02/2010    Poss muscle se of 10 crestor     Past Surgical History  Procedure Laterality Date  . Doppler echocardiography  2009    nl lv function mild lv dilitation  . Abdominal hysterectomy  2002    tubal  . Cardiac catheterization  2000    chest pains neg  . Rt shoulder surgery    . Oophorectomy  2002  . Appendectomy  2002  . Craniotomy  July 19 13    nerve decompression right trigeminal   . Colonoscopy      multiple    Family History  Problem Relation Age of Onset  . Ovarian cancer Mother   . Stroke Mother   . Alcohol abuse Father   . Stroke Father   . Seizures Daughter   . Hypertension    . Diabetes Brother   . Cancer Paternal Aunt     leukemia, unknown type  . Colon cancer Neg Hx     Social History  Substance Use Topics  . Smoking status: Never Smoker   . Smokeless tobacco: Never Used  . Alcohol Use: 1.2 oz/week    0 Standard drinks or equivalent,  2 Glasses of wine per week     Review of Systems  Constitutional: Positive for chills, appetite change and fatigue.  Respiratory: Negative for shortness of breath.   Cardiovascular: Negative for chest pain, palpitations and leg swelling.  Gastrointestinal: Positive for nausea, abdominal pain and diarrhea. Negative for vomiting.  Genitourinary: Negative for dysuria, frequency and difficulty urinating.  Musculoskeletal: Negative for myalgias, back pain, neck pain and neck stiffness.  Skin: Negative for rash and wound.  Neurological: Negative for dizziness, weakness, light-headedness, numbness and headaches.  All other systems reviewed and are negative.  Home Medications   Prior to Admission medications   Medication Sig Start Date End Date Taking? Authorizing Provider  albuterol (PROVENTIL HFA;VENTOLIN HFA) 108 (90 BASE) MCG/ACT inhaler Inhale 2 puffs into the lungs every 6  (six) hours as needed. 05/27/14  Yes [name], MD  amLODipine (NORVASC) 10 MG tablet TAKE ONE TABLET BY MOUTH ONCE DAILY 12/08/14  Yes [name], MD  benazepril (LOTENSIN) 20 MG tablet TAKE ONE TABLET BY MOUTH ONCE DAILY 10/03/14  Yes [name], MD  butalbital-aspirin-caffeine-codeine [facility] WITH CODEINE) 50-325-40-30 MG capsule Take 1 capsule by mouth every 4 (four) hours as needed for migraine. 1-2 by mouth every 4-6 hours as needed max of 6 tabs in 24 hours 05/27/14  Yes [name], MD  chlorthalidone (HYGROTON) 25 MG tablet TAKE ONE TABLET BY MOUTH ONCE DAILY 11/10/14  Yes [name], MD  EPITOL 200 MG tablet TAKE TWO TABLETS BY MOUTH TWICE DAILY 11/10/14  Yes [name], MD  gabapentin (NEURONTIN) 300 MG capsule Take 1 capsule (300 mg total) by mouth at bedtime. 02/05/15  Yes Adam Telford Nab, DO  metoprolol (LOPRESSOR) 50 MG tablet TAKE ONE-HALF TABLET BY MOUTH TWICE DAILY 10/16/14  Yes [name], MD    Allergies  Sulfamethoxazole-trimethoprim  Triage Vitals: BP 151/66 mmHg  Pulse 104  Temp(Src) 100.1 F (37.8 C) (Oral)  Resp 15  Ht 5' 6"  (1.676 m)  Wt 133 lb (60.328 kg)  BMI 21.48 kg/m2  SpO2 95%  Physical Exam  Constitutional: She is oriented to person, place, and time. She appears well-developed and well-nourished. No distress.  HENT:  Head: Normocephalic and atraumatic.  Mouth/Throat: Oropharynx is clear and moist.  Eyes: EOM are normal. Pupils are equal, round, and reactive to light.  Neck: Normal range of motion. Neck supple.  Cardiovascular: Normal rate and regular rhythm.   Pulmonary/Chest: Effort normal and breath sounds normal. No respiratory distress. She has no wheezes. She has no rales. She exhibits no tenderness.  Abdominal: Soft. Bowel sounds are normal. She exhibits no distension and no mass. There is tenderness (Tenderness to palpation in the left lower quadrant and right upper quadrant. Questionable guarding). There is guarding. There is  no rebound.  Musculoskeletal: Normal range of motion. She exhibits no edema or tenderness.  Right CVA tenderness to percussion. No lower extremity swelling or pain.  Neurological: She is alert and oriented to person, place, and time.  Moves all extremities without deficit. Sensation is fully intact.  Skin: Skin is warm and dry. No rash noted. No erythema.  Psychiatric: She has a normal mood and affect. Her behavior is normal.  Nursing note and vitals reviewed.   ED Course  Procedures   DIAGNOSTIC STUDIES: Oxygen Saturation is 95% on RA, normal by my interpretation.    COORDINATION OF CARE: 12:29 AM Discussed treatment plan which includes lab work, abdominal US, Zofran, morphine, and IVF  with pt at bedside and pt agreed to plan.  2:33 AM I re-evaluated the patient and provided an update on the results of her lab work and ultrasound. She is in agreement with the plan for CT A/P.   3:48 AM I updated the pt on the results of her CT scan and the plan for admission to the hospital for IV abx. She is in agreement.   3:53 AM-Consult complete with Dr. Raynelle Jan). Patient case explained and discussed.  Agrees to admit patient for further evaluation and treatment. Call ended at 3:54 AM.   Labs Reviewed  URINALYSIS, ROUTINE W REFLEX MICROSCOPIC (NOT AT [facility]) - Abnormal; Notable for the following:    APPearance CLOUDY (*)    Hgb urine dipstick MODERATE (*)    Ketones, ur 15 (*)    Protein, ur 100 (*)    Leukocytes, UA LARGE (*)    All other components within normal limits  CBC WITH DIFFERENTIAL/PLATELET - Abnormal; Notable for the following:    Platelets 121 (*)    Lymphs Abs 0.2 (*)    All other components within normal limits  COMPREHENSIVE METABOLIC PANEL - Abnormal; Notable for the following:    Sodium 133 (*)    Potassium 3.0 (*)    Chloride 92 (*)    Glucose, Bld 116 (*)    BUN 24 (*)    Creatinine, Ser 1.41 (*)    Calcium 8.8 (*)    Total Protein 5.7 (*)     Albumin 3.3 (*)    GFR calc non Af Amer 35 (*)    GFR calc Af Amer 41 (*)    All other components within normal limits  URINE MICROSCOPIC-ADD ON - Abnormal; Notable for the following:    Squamous Epithelial / LPF FEW (*)    Bacteria, UA FEW (*)    All other components within normal limits  LIPASE, BLOOD  I-STAT TROPOININ, ED    Imaging Review Ct Abdomen Pelvis W Contrast  03/12/2015  CLINICAL DATA:  Diffuse abdominal pain, onset yesterday. EXAM: CT ABDOMEN AND PELVIS WITH CONTRAST TECHNIQUE: Multidetector CT imaging of the abdomen and pelvis was performed using the standard protocol following bolus administration of intravenous contrast. CONTRAST:  57m OMNIPAQUE IOHEXOL 300 MG/ML  SOLN COMPARISON:  Abdominal ultrasound earlier this day. FINDINGS: Lower chest:  The included lung bases are clear. Liver: Homogeneous in density.  No focal lesion. Hepatobiliary: Gallbladder physiologically distended. Small calcified gallstone noted. No biliary dilatation. Pancreas: Atrophic. No ductal dilatation or peripancreatic inflammatory change. Spleen: Normal. Adrenal glands: No nodule. Kidneys: Right perinephric stranding with urothelial enhancement consistent with pyelonephritis. There is a 2.5 cm simple cyst in the lower right kidney. Additional tiny cortical hypodensities present, too small to characterize. No perirenal or intrarenal fluid collection. Homogeneous enhancement of the left kidney. Nonobstructing punctate stone in the left lower pole. Small nonenhancing cyst in the left kidney, with additional tiny cortical hypodensities. Stomach/Bowel: Stomach physiologically distended. There are no dilated or thickened small bowel loops. Small volume of stool throughout the colon without colonic wall thickening. Multifocal diverticulosis throughout the colon without diverticulitis. The appendix is not visualized. Vascular/Lymphatic: No retroperitoneal adenopathy. Abdominal aorta is normal in caliber, tortuous in  its course. Mild atherosclerosis. Reproductive: Uterus is surgically absent.  No adnexal mass. Bladder: Physiologically distended. Minimal perivesicular stranding about the dome. Other: No free air, free fluid, or intra-abdominal fluid collection. Tiny fat containing umbilical hernia. Musculoskeletal: There are no acute or suspicious osseous abnormalities. Hemi transitional lumbosacral  anatomy. IMPRESSION: 1. Findings consistent with right pyelonephritis. 2. Chronic findings include diverticulosis without diverticulitis, cholelithiasis, and bilateral renal cysts. Electronically Signed   By: Jeb Levering M.D.   On: 03/12/2015 03:30   US Abdomen Limited  03/12/2015  CLINICAL DATA:  Right upper quadrant pain with nausea and vomiting. Symptoms for 1 day. EXAM: US ABDOMEN LIMITED - RIGHT UPPER QUADRANT COMPARISON:  Abdominal ultrasound 02/20/2008, MRI 02/29/2008 FINDINGS: Gallbladder: Physiologically distended. No intraluminal 1.2 cm gallstone. No wall thickening visualized. No sonographic Murphy sign noted. Common bile duct: Diameter: 4 mm, normal. Liver: No focal lesion identified. Within normal limits in parenchymal echogenicity. Normal directional flow in the main portal vein. IMPRESSION: Cholelithiasis without findings of acute cholecystitis. No biliary dilatation. Electronically Signed   By: Jeb Levering M.D.   On: 03/12/2015 01:55    I personally reviewed and evaluated these images and lab results as a part of my medical decision-making.  ED ECG REPORT   Date: 03/12/2015  Rate: 103    Rhythm: sinus tachycardia  QRS Axis: normal  Intervals: QT prolonged  ST/T Wave abnormalities: nonspecific T wave changes  Conduction Disutrbances:none  Narrative Interpretation:   Old EKG Reviewed: none available  I have personally reviewed the EKG tracing and agree with the computerized printout as noted.   MDM   Final diagnoses:  Pyelonephritis  Hypokalemia  Dehydration     I, Lavell Ridings,  Swara Donze, personally performed the services described in this documentation. All medical record entries made by the scribe were at my direction and in my presence.  I have reviewed the chart and discharge instructions and agree that the record reflects my personal performance and is accurate and complete. Birch Farino.  03/12/2015. 4:17 AM.    Patient with initial improvement of abdominal pain after IV morphine. Continues to have right flank tenderness and right upper quadrant tenderness with palpation. Also has left lower quadrant tenderness. Ultrasound without evidence of choledocholithiasis and no evidence of cholecystitis. CT scan obtained to rule out other infectious process. Right-sided pyelonephritis demonstrated. IV antibiotics initiated in the emergency department. Also initiated potassium replacement. Discussed with Dr Hal Hope. Will admit patient to MedSurg bed.  Angela Rice, MD 03/12/15 [phone]

## 2015-03-12 NOTE — Progress Notes (Signed)
Patient arrived to unit  From emergency department.Assisted to bed by nursing staff.Patient denies pain or discomfort at present time.Oriented patient to unit and call bell.No acute distress noted .Will continue to monitor patient.

## 2015-03-12 NOTE — ED Notes (Signed)
Dr. Lita Mains at bedside to update pt.

## 2015-03-12 NOTE — Progress Notes (Addendum)
PROGRESS NOTE    [name] GOT:[id number] DOB: 03-08-1941 DOA: 03/11/2015 PCP: [name], MD  HPI/Brief narrative 74 year old female with history of HTN, HLD, GERD, trigeminal neuralgia, presented to [facility] ED on 03/11/15 because of generalized abdominal pain, right flank pain/RUQ pain which worsened 24 hours prior to admission, an episode of nonbloody emesis 2 days prior. She denies fevers. Had a loose stool after mag citrate. In the ED CT confirmed right pyelonephritis. CT abdomen and RUQ ultrasound also demonstrated gallstones without cholecystitis. She did not meet sepsis criteria on initial arrival last night. However this morning, she has fever of 102.9, pulse 111/m, WBC 13. She is being managed for sepsis secondary to acute right pyelonephritis.   Assessment/Plan:  Sepsis, not present on admission-secondary to acute right pyelonephritis - Initiated sepsis protocol this morning with aggressive IV fluids, cultures, lactate, pro-calcitonin and continue IV Rocephin.  Acute right pyelonephritis - Patient confirms dysuria, abdominal and right flank pain. - CT abdomen confirms acute right pyelonephritis. - Continue IV Rocephin pending urine culture results.  Hypokalemia - Replace and follow as needed.    Acute kidney injury  - Creatinine on 08/19/14:0.99. Admitted with creatinine of 1.41. - Likely secondary to sepsis and dehydration - Aggressive IV fluids and follow BMP in a.m. Avoid nephrotoxic medications-hold ACE inhibitor & chlorthalidone for now - Patient did receive IV contrast for CT scan on admission.  Dehydration - IV fluids.   Thrombocytopenia - May be related to sepsis. Treat underlying cause. Follow CBCs.  Abdominal pain - Likely secondary to acute pyelonephritis. Low index of suspicion for acute cholecystitis. Follow HIDA scan ordered on admission. - Diet as tolerated and monitor.  Essential hypertension - Controlled. Continue metoprolol &  amlodipine. Hold ACEI & diuretics due to acute kidney injury  Trigeminal neuralgia - Continue carbamazepine and gabapentin   DVT prophylaxis: Lovenox  Code Status: DO NOT RESUSCITATE Family Communication: None at bedside  Disposition Plan: DC home when medically stable    Consultants:  None   Procedures:  None   Antibiotics:  IV Rocephin 10/19 >   Subjective: Feels better than on admission. Still complains of mild-moderate diffuse abdominal pain and nausea without vomiting. No flank pain today. NPO for HIDA scan.   Objective: Filed Vitals:   03/12/15 0400 03/12/15 0415 03/12/15 0534 03/12/15 0818  BP: 108/53 106/53 126/61 137/60  Pulse: 87 86 86 111  Temp:   98.3 F (36.8 C) 102.9 F (39.4 C)  TempSrc:   Oral Oral  Resp: 12 11 18 16   Height:   5' 6"  (1.676 m)   Weight:   61.1 kg (134 lb 11.2 oz)   SpO2: 93% 94% 97% 93%    Intake/Output Summary (Last 24 hours) at 03/12/15 0953 Last data filed at 03/12/15 0912  Gross per 24 hour  Intake      0 ml  Output    650 ml  Net   -650 ml   Filed Weights   03/11/15 2350 03/12/15 0534  Weight: 60.328 kg (133 lb) 61.1 kg (134 lb 11.2 oz)     Exam:  General exam: Moderately built and nourished pleasant elderly female lying comfortably supine in bed. Does not appear toxic.  Respiratory system: Clear. No increased work of breathing. Cardiovascular system: S1 & S2 heard, RRR. No JVD, murmurs, gallops, clicks or pedal edema. Gastrointestinal system: Abdomen is nondistended, soft. Mild suprapubic tenderness without rigidity, guarding or rebound.   Normal bowel sounds heard. Central nervous system: Alert and  oriented. No focal neurological deficits. Extremities: Symmetric 5 x 5 power.   Data Reviewed: Basic Metabolic Panel:  Recent Labs Lab 03/12/15 0038 03/12/15 0704  NA 133* 136  K 3.0* 3.6  CL 92* 94*  CO2 31 29  GLUCOSE 116* 124*  BUN 24* 17  CREATININE 1.41* 1.42*  CALCIUM 8.8* 9.2  MG  --  2.0    Liver Function Tests:  Recent Labs Lab 03/12/15 0038  AST 33  ALT 29  ALKPHOS 73  BILITOT 0.6  PROT 5.7*  ALBUMIN 3.3*    Recent Labs Lab 03/12/15 0038  LIPASE 29   No results for input(s): AMMONIA in the last 168 hours. CBC:  Recent Labs Lab 03/12/15 0038 03/12/15 0704  WBC 7.5 13.0*  NEUTROABS 7.0 12.5*  HGB 14.7 15.5*  HCT 42.6 45.7  MCV 90.1 91.2  PLT 121* 139*   Cardiac Enzymes: No results for input(s): CKTOTAL, CKMB, CKMBINDEX, TROPONINI in the last 168 hours. BNP (last 3 results) No results for input(s): PROBNP in the last 8760 hours. CBG: No results for input(s): GLUCAP in the last 168 hours.  No results found for this or any previous visit (from the past 240 hour(s)).         Studies: Ct Abdomen Pelvis W Contrast  03/12/2015  CLINICAL DATA:  Diffuse abdominal pain, onset yesterday. EXAM: CT ABDOMEN AND PELVIS WITH CONTRAST TECHNIQUE: Multidetector CT imaging of the abdomen and pelvis was performed using the standard protocol following bolus administration of intravenous contrast. CONTRAST:  2m OMNIPAQUE IOHEXOL 300 MG/ML  SOLN COMPARISON:  Abdominal ultrasound earlier this day. FINDINGS: Lower chest:  The included lung bases are clear. Liver: Homogeneous in density.  No focal lesion. Hepatobiliary: Gallbladder physiologically distended. Small calcified gallstone noted. No biliary dilatation. Pancreas: Atrophic. No ductal dilatation or peripancreatic inflammatory change. Spleen: Normal. Adrenal glands: No nodule. Kidneys: Right perinephric stranding with urothelial enhancement consistent with pyelonephritis. There is a 2.5 cm simple cyst in the lower right kidney. Additional tiny cortical hypodensities present, too small to characterize. No perirenal or intrarenal fluid collection. Homogeneous enhancement of the left kidney. Nonobstructing punctate stone in the left lower pole. Small nonenhancing cyst in the left kidney, with additional tiny cortical  hypodensities. Stomach/Bowel: Stomach physiologically distended. There are no dilated or thickened small bowel loops. Small volume of stool throughout the colon without colonic wall thickening. Multifocal diverticulosis throughout the colon without diverticulitis. The appendix is not visualized. Vascular/Lymphatic: No retroperitoneal adenopathy. Abdominal aorta is normal in caliber, tortuous in its course. Mild atherosclerosis. Reproductive: Uterus is surgically absent.  No adnexal mass. Bladder: Physiologically distended. Minimal perivesicular stranding about the dome. Other: No free air, free fluid, or intra-abdominal fluid collection. Tiny fat containing umbilical hernia. Musculoskeletal: There are no acute or suspicious osseous abnormalities. Hemi transitional lumbosacral anatomy. IMPRESSION: 1. Findings consistent with right pyelonephritis. 2. Chronic findings include diverticulosis without diverticulitis, cholelithiasis, and bilateral renal cysts. Electronically Signed   By: MJeb LeveringM.D.   On: 03/12/2015 03:30   [place]domen Limited  03/12/2015  CLINICAL DATA:  Right upper quadrant pain with nausea and vomiting. Symptoms for 1 day. EXAM: [place]DOM[place] LIMITED - RIGHT UPPER QUADRANT COMPARISON:  Abdominal ultrasound 02/20/2008, MRI 02/29/2008 FINDINGS: Gallbladder: Physiologically distended. No intraluminal 1.2 cm gallstone. No wall thickening visualized. No sonographic Murphy sign noted. Common bile duct: Diameter: 4 mm, normal. Liver: No focal lesion identified. Within normal limits in parenchymal echogenicity. Normal directional flow in the main portal vein. IMPRESSION: Cholelithiasis without  findings of acute cholecystitis. No biliary dilatation. Electronically Signed   By: Jeb Levering M.D.   On: 03/12/2015 01:55        Scheduled Meds: . amLODipine  10 mg Oral Daily  . [START ON 03/13/2015] benazepril  20 mg Oral Daily  . carbamazepine  400 mg Oral BID  . [START ON 03/13/2015]  cefTRIAXone (ROCEPHIN)  IV  1 g Intravenous Q24H  . enoxaparin (LOVENOX) injection  40 mg Subcutaneous Daily  . gabapentin  300 mg Oral QHS  . metoprolol  25 mg Oral BID  . sodium chloride  1,000 mL Intravenous Q1H   Continuous Infusions: . sodium chloride 100 mL/hr at 03/12/15 9470    Principal Problem:   Pyelonephritis Active Problems:   Trigeminal neuralgia   Essential hypertension   ARF (acute renal failure) (HCC)   Gallstones    Time spent: 35 minutes.    Vernell Leep, MD, FACP, FHM. [facility] Pager [phone]  If 7PM-7AM, please contact night-coverage [email or web address] Password [email or web address] 03/12/2015, 9:53 AM    LOS: 0 days              And

## 2015-03-12 NOTE — H&P (Signed)
[facility] History and Physical  [name] BMW:[id number] DOB: Sep 28, 1940 DOA: 03/11/2015  Referring physician: Dr. Lita Mains. PCP: [name], MD  Specialists: Neurologist.  Chief Complaint: Abdominal pain.  HPI: [name] is a 74 y.o. female with history of hypertension, trigeminal neuralgia presents to the ER because of abdominal pain. Patient has been having abdominal pain mostly in the right flank and right upper quadrant which has become more generalized over the last 48 hours. Patient had one episode of nausea and vomiting following which patient has remained nauseated. Denies any diarrhea the patient did try mag citrate and had bowel movement. Patient also has subjective feeling of fever and chills. In the ER patient had sonogram of the abdomen and CT scan of the abdomen and pelvis which shows right-sided pyelonephritis with gallstones with no evidence of cholecystitis. UA is consistent with UTI. Patient was given pain relief medications and presently patient abdomen pain is markedly improved and has been admitted for IV antibiotics for pyelonephritis. Denies any chest pain or shortness of breath.   Review of Systems: As presented in the history of presenting illness, rest negative.  Past Medical History  Diagnosis Date  . GERD (gastroesophageal reflux disease)   . Hyperlipidemia   . Hypertension     echo nl lv function  mild dilitation 2009  . Migraine     hypnic HA eval by Dr. Earley Favor in the past  . Abnormal blood finding     elevated Hg and hct    . 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 Hearing aid worn   . Polycythemia   . Trigeminal neuralgia pain   . Closed head injury 02/01/2011    from syncope and had scalp laceration  neg ct .    Marland Kitchen Syncope 02/01/2011    In shower on vacation  sustained head laceration  8 sutures Had ed visit neg head ct labs and x ray   . ADJ DISORDER WITH MIXED ANXIETY & DEPRESSED MOOD 03/03/2010    Qualifier: Diagnosis of   By: [name], [name]   . Medication side effect 09/02/2010    Poss muscle se of 10 crestor    Past Surgical History  Procedure Laterality Date  . Doppler echocardiography  2009    nl lv function mild lv dilitation  . Abdominal hysterectomy  2002    tubal  . Cardiac catheterization  2000    chest pains neg  . Rt shoulder surgery    . Oophorectomy  2002  . Appendectomy  2002  . Craniotomy  July 19 13    nerve decompression right trigeminal   . Colonoscopy      multiple   Social History:  reports that she has never smoked. She has never used smokeless tobacco. She reports that she drinks about 1.2 oz of alcohol per week. She reports that she does not use illicit drugs. Where does patient live home. Can patient participate in ADLs? Yes.  Allergies  Allergen Reactions  . Sulfamethoxazole-Trimethoprim     REACTION: unspecified    Family History:  Family History  Problem Relation Age of Onset  . Ovarian cancer Mother   . Stroke Mother   . Alcohol abuse Father   . Stroke Father   . Seizures Daughter   . Hypertension    . Diabetes Brother   . Cancer Paternal Aunt     leukemia, unknown type  . Colon cancer Neg Hx  Prior to Admission medications   Medication Sig Start Date End Date Taking? Authorizing Provider  albuterol (PROVENTIL HFA;VENTOLIN HFA) 108 (90 BASE) MCG/ACT inhaler Inhale 2 puffs into the lungs every 6 (six) hours as needed. 05/27/14  Yes [name], MD  amLODipine (NORVASC) 10 MG tablet TAKE ONE TABLET BY MOUTH ONCE DAILY 12/08/14  Yes [name], MD  benazepril (LOTENSIN) 20 MG tablet TAKE ONE TABLET BY MOUTH ONCE DAILY 10/03/14  Yes [name], MD  butalbital-aspirin-caffeine-codeine [facility] WITH CODEINE) 50-325-40-30 MG capsule Take 1 capsule by mouth every 4 (four) hours as needed for migraine. 1-2 by mouth every 4-6 hours as needed max of 6 tabs in 24 hours 05/27/14  Yes [name], MD  chlorthalidone (HYGROTON) 25 MG tablet TAKE ONE  TABLET BY MOUTH ONCE DAILY 11/10/14  Yes [name], MD  EPITOL 200 MG tablet TAKE TWO TABLETS BY MOUTH TWICE DAILY 11/10/14  Yes [name], MD  gabapentin (NEURONTIN) 300 MG capsule Take 1 capsule (300 mg total) by mouth at bedtime. 02/05/15  Yes Adam Telford Nab, DO  metoprolol (LOPRESSOR) 50 MG tablet TAKE ONE-HALF TABLET BY MOUTH TWICE DAILY 10/16/14  Yes [name], MD    Physical Exam: Filed Vitals:   03/12/15 0345 03/12/15 0400 03/12/15 0415 03/12/15 0534  BP: 108/48 108/53 106/53 126/61  Pulse: 89 87 86 86  Temp:    98.3 F (36.8 C)  TempSrc:    Oral  Resp: 13 12 11 18   Height:    5' 6"  (1.676 m)  Weight:    61.1 kg (134 lb 11.2 oz)  SpO2: 94% 93% 94% 97%     General:  Moderately built and nourished.  Eyes: Anicteric. no pallor.  ENT: No discharge from the ears eyes nose or mouth.  Neck: No mass felt. No JVD appreciated.  Cardiovascular: S1-S2 heard.  Respiratory: No longer palpitations.  Abdomen: Presently soft nontender bowel sounds present. No guarding or rigidity.  Skin: No rash.  Musculoskeletal: No edema.  Psychiatric: Appears normal.  Neurologic: Alert awake oriented to time place and person. Moves all extremities.  Labs on Admission:  Basic Metabolic Panel:  Recent Labs Lab 03/12/15 0038  NA 133*  K 3.0*  CL 92*  CO2 31  GLUCOSE 116*  BUN 24*  CREATININE 1.41*  CALCIUM 8.8*   Liver Function Tests:  Recent Labs Lab 03/12/15 0038  AST 33  ALT 29  ALKPHOS 73  BILITOT 0.6  PROT 5.7*  ALBUMIN 3.3*    Recent Labs Lab 03/12/15 0038  LIPASE 29   No results for input(s): AMMONIA in the last 168 hours. CBC:  Recent Labs Lab 03/12/15 0038  WBC 7.5  NEUTROABS 7.0  HGB 14.7  HCT 42.6  MCV 90.1  PLT 121*   Cardiac Enzymes: No results for input(s): CKTOTAL, CKMB, CKMBINDEX, TROPONINI in the last 168 hours.  BNP (last 3 results) No results for input(s): BNP in the last 8760 hours.  ProBNP (last 3 results) No results  for input(s): PROBNP in the last 8760 hours.  CBG: No results for input(s): GLUCAP in the last 168 hours.  Radiological Exams on Admission: Ct Abdomen Pelvis W Contrast  03/12/2015  CLINICAL DATA:  Diffuse abdominal pain, onset yesterday. EXAM: CT ABDOMEN AND PELVIS WITH CONTRAST TECHNIQUE: Multidetector CT imaging of the abdomen and pelvis was performed using the standard protocol following bolus administration of intravenous contrast. CONTRAST:  35m OMNIPAQUE IOHEXOL 300 MG/ML  SOLN COMPARISON:  Abdominal ultrasound earlier this day. FINDINGS: Lower chest:  The included lung bases are clear. Liver: Homogeneous in density.  No focal lesion. Hepatobiliary: Gallbladder physiologically distended. Small calcified gallstone noted. No biliary dilatation. Pancreas: Atrophic. No ductal dilatation or peripancreatic inflammatory change. Spleen: Normal. Adrenal glands: No nodule. Kidneys: Right perinephric stranding with urothelial enhancement consistent with pyelonephritis. There is a 2.5 cm simple cyst in the lower right kidney. Additional tiny cortical hypodensities present, too small to characterize. No perirenal or intrarenal fluid collection. Homogeneous enhancement of the left kidney. Nonobstructing punctate stone in the left lower pole. Small nonenhancing cyst in the left kidney, with additional tiny cortical hypodensities. Stomach/Bowel: Stomach physiologically distended. There are no dilated or thickened small bowel loops. Small volume of stool throughout the colon without colonic wall thickening. Multifocal diverticulosis throughout the colon without diverticulitis. The appendix is not visualized. Vascular/Lymphatic: No retroperitoneal adenopathy. Abdominal aorta is normal in caliber, tortuous in its course. Mild atherosclerosis. Reproductive: Uterus is surgically absent.  No adnexal mass. Bladder: Physiologically distended. Minimal perivesicular stranding about the dome. Other: No free air, free fluid, or  intra-abdominal fluid collection. Tiny fat containing umbilical hernia. Musculoskeletal: There are no acute or suspicious osseous abnormalities. Hemi transitional lumbosacral anatomy. IMPRESSION: 1. Findings consistent with right pyelonephritis. 2. Chronic findings include diverticulosis without diverticulitis, cholelithiasis, and bilateral renal cysts. Electronically Signed   By: Jeb Levering M.D.   On: 03/12/2015 03:30   US Abdomen Limited  03/12/2015  CLINICAL DATA:  Right upper quadrant pain with nausea and vomiting. Symptoms for 1 day. EXAM: US ABDOMEN LIMITED - RIGHT UPPER QUADRANT COMPARISON:  Abdominal ultrasound 02/20/2008, MRI 02/29/2008 FINDINGS: Gallbladder: Physiologically distended. No intraluminal 1.2 cm gallstone. No wall thickening visualized. No sonographic Murphy sign noted. Common bile duct: Diameter: 4 mm, normal. Liver: No focal lesion identified. Within normal limits in parenchymal echogenicity. Normal directional flow in the main portal vein. IMPRESSION: Cholelithiasis without findings of acute cholecystitis. No biliary dilatation. Electronically Signed   By: Jeb Levering M.D.   On: 03/12/2015 01:55     Assessment/Plan Principal Problem:   Pyelonephritis Active Problems:   Trigeminal neuralgia   Essential hypertension   ARF (acute renal failure) (HCC)   Gallstones   1. Pyelonephritis - CT scan shows right-sided hilar nephritis and UA suggestive of UTI. Patient has been placed on ceftriaxone. Follow urine cultures. 2. Acute renal failure probably from dehydration from nausea vomiting - gently hydrate and recheck metabolic panel. Hold HCTZ for now. 3. Mild hypokalemia - replace and recheck. Check magnesium. Probably from vomiting and also HCTZ. 4. Hypertension - continue home medications ACE inhibitor as metoprolol but holding off HCTZ as patient is receiving hydration. May have to hold benazepril if creatinine worsens. 5. Gallstones - checking HIDA scan to rule  out acute cholecystitis. 6. History of trigeminal neuralgia - on carbamazepine. 7. History of headache on Neurontin. 8. Mild thrombocytopenia - closely follow CBC. Patient is on Neurontin.  I have reviewed patient's old charts and labs.   DVT Prophylaxis Lovenox.  Code Status: DO NOT RESUSCITATE.  Family Communication: Patient's husband.  Disposition Plan: Admit to inpatient.    Kasumi Ditullio N. [facility] Pager [phone].  If 7PM-7AM, please contact night-coverage [email or web address] Password [email or web address] 03/12/2015, 6:06 AM

## 2015-03-12 NOTE — Care Management Note (Signed)
Case Management Note  Patient Details  Name: [name] MRN: [id number] Date of Birth: 1940/07/25  Subjective/Objective:                 Independent patient from home, lives with spouse. Admitted with pyelonephritis. Will have hida scan today.  Action/Plan:  Anticipate DC to home in 1-2 days. No CM needs identified at this time.   Expected Discharge Date:                  Expected Discharge Plan:  Home/Self Care  In-House Referral:     Discharge planning Services  CM Consult  Post Acute Care Choice:    Choice offered to:     DME Arranged:    DME Agency:     HH Arranged:    HH Agency:     Status of Service:  In process, will continue to follow  Medicare Important Message Given:    Date Medicare IM Given:    Medicare IM give by:    Date Additional Medicare IM Given:    Additional Medicare Important Message give by:     If discussed at [place] of Stay Meetings, dates discussed:    Additional Comments:  Carles Collet, RN 03/12/2015, 11:41 AM

## 2015-03-12 NOTE — Progress Notes (Signed)
CRITICAL VALUE ALERT  Critical value received:  Lactic Acid 2.7  Date of notification:  03/12/15  Time of notification:  1100  Critical value read back:Yes.    Nurse who received alert:  Riccardo Dubin, RN  MD notified (1st page):  [place]  Time of first page:  1140  Responding MD:  Algis Liming  Time MD responded:  [phone]

## 2015-03-13 ENCOUNTER — Inpatient Hospital Stay (HOSPITAL_COMMUNITY): Payer: Medicare Other

## 2015-03-13 DIAGNOSIS — I10 Essential (primary) hypertension: Secondary | ICD-10-CM

## 2015-03-13 DIAGNOSIS — N179 Acute kidney failure, unspecified: Secondary | ICD-10-CM

## 2015-03-13 DIAGNOSIS — N12 Tubulo-interstitial nephritis, not specified as acute or chronic: Secondary | ICD-10-CM

## 2015-03-13 DIAGNOSIS — G500 Trigeminal neuralgia: Secondary | ICD-10-CM

## 2015-03-13 LAB — CBC WITH DIFFERENTIAL/PLATELET
Basophils Absolute: 0 10*3/uL (ref 0.0–0.1)
Basophils Relative: 0 %
Eosinophils Absolute: 0.1 10*3/uL (ref 0.0–0.7)
Eosinophils Relative: 2 %
HCT: 37.6 % (ref 36.0–46.0)
Hemoglobin: 12.8 g/dL (ref 12.0–15.0)
Lymphocytes Relative: 5 %
Lymphs Abs: 0.3 10*3/uL — ABNORMAL LOW (ref 0.7–4.0)
MCH: 31.1 pg (ref 26.0–34.0)
MCHC: 34 g/dL (ref 30.0–36.0)
MCV: 91.5 fL (ref 78.0–100.0)
Monocytes Absolute: 0.9 10*3/uL (ref 0.1–1.0)
Monocytes Relative: 13 %
Neutro Abs: 5.5 10*3/uL (ref 1.7–7.7)
Neutrophils Relative %: 80 %
Platelets: 101 10*3/uL — ABNORMAL LOW (ref 150–400)
RBC: 4.11 MIL/uL (ref 3.87–5.11)
RDW: 12.9 % (ref 11.5–15.5)
WBC: 6.8 10*3/uL (ref 4.0–10.5)

## 2015-03-13 LAB — BASIC METABOLIC PANEL
Anion gap: 8 (ref 5–15)
BUN: 13 mg/dL (ref 6–20)
CO2: 27 mmol/L (ref 22–32)
Calcium: 8.5 mg/dL — ABNORMAL LOW (ref 8.9–10.3)
Chloride: 103 mmol/L (ref 101–111)
Creatinine, Ser: 0.91 mg/dL (ref 0.44–1.00)
GFR calc Af Amer: >60 mL/min (ref >60)
GFR calc non Af Amer: >60 mL/min (ref >60)
Glucose, Bld: 104 mg/dL — ABNORMAL HIGH (ref 65–99)
Potassium: 3.1 mmol/L — ABNORMAL LOW (ref 3.5–5.1)
Sodium: 138 mmol/L (ref 135–145)

## 2015-03-13 MED ORDER — BENAZEPRIL HCL 20 MG PO TABS
20.0000 mg | ORAL_TABLET | Freq: Every day | ORAL | Status: DC
  Administered 2015-03-14: 20 mg via ORAL
  Filled 2015-03-13 (×2): qty 1

## 2015-03-13 MED ORDER — TECHNETIUM TC 99M MEBROFENIN IV KIT
5.3000 | PACK | Freq: Once | INTRAVENOUS | Status: DC | PRN
  Administered 2015-03-13: 5 via INTRAVENOUS
  Filled 2015-03-13: qty 6

## 2015-03-13 MED ORDER — POTASSIUM CHLORIDE CRYS ER 20 MEQ PO TBCR
40.0000 meq | EXTENDED_RELEASE_TABLET | Freq: Once | ORAL | Status: AC
  Administered 2015-03-13: 40 meq via ORAL
  Filled 2015-03-13: qty 2

## 2015-03-13 NOTE — Progress Notes (Signed)
PROGRESS NOTE    [name] VWU:[id number] DOB: 1940-07-29 DOA: 03/11/2015 PCP: [name], MD  HPI/Brief narrative 74 year old female with history of HTN, HLD, GERD, trigeminal neuralgia, presented to [facility] ED on 03/11/15 because of generalized abdominal pain, right flank pain/RUQ pain which worsened 24 hours prior to admission, an episode of nonbloody emesis 2 days prior. She denies fevers. Had a loose stool after mag citrate. In the ED CT confirmed right pyelonephritis. CT abdomen and RUQ ultrasound also demonstrated gallstones without cholecystitis. She did not meet sepsis criteria on initial arrival last night. However this morning, she has fever of 102.9, pulse 111/m, WBC 13. She is being managed for sepsis secondary to acute right pyelonephritis.   Assessment/Plan:  Sepsis, not present on admission-secondary to acute right pyelonephritis - Initiated sepsis protocol 10/20 with aggressive IV fluids, cultures, lactate, pro-calcitonin and continue IV Rocephin.  Acute right pyelonephritis - Patient confirms dysuria, abdominal and right flank pain. - CT abdomen confirms acute right pyelonephritis. - Continue IV Rocephin pending urine culture results. -blood cultures pending  Hypokalemia - Replace and follow as needed.    Acute kidney injury  - Creatinine on 08/19/14:0.99. Admitted with creatinine of 1.41, now 0.91 - Likely secondary to sepsis and dehydration - Avoid nephrotoxic medications-hold ACE inhibitor & chlorthalidone for now - Patient did receive IV contrast for CT scan on admission.  Dehydration - IV fluids.   Thrombocytopenia - May be related to sepsis. Treat underlying cause. Follow CBCs.  Abdominal pain - Likely secondary to acute pyelonephritis. Low index of suspicion for acute cholecystitis. Follow HIDA scan ordered on admission. - Diet as tolerated and monitor.  Essential hypertension - Controlled. Continue metoprolol & amlodipine. Hold ACEI &  diuretics due to acute kidney injury  Trigeminal neuralgia - Continue carbamazepine and gabapentin  Hypokalemia -replete   DVT prophylaxis: Lovenox  Code Status: DO NOT RESUSCITATE Family Communication: None at bedside  Disposition Plan: DC home when medically stable - suspect 1-2 more days pending cultures   Consultants:  None   Procedures:  None   Antibiotics:  IV Rocephin 10/19 >   Subjective: Much improved today-- await HIDA  Objective: Filed Vitals:   03/13/15 0014 03/13/15 0437 03/13/15 0916 03/13/15 1033  BP: 110/59 151/82 160/86 165/94  Pulse: 74 87 75 80  Temp: 98.8 F (37.1 C) 99.2 F (37.3 C) 98.8 F (37.1 C)   TempSrc: Oral Oral Oral   Resp: 18 18  18   Height:      Weight:      SpO2: 95% 95% 95% 96%    Intake/Output Summary (Last 24 hours) at 03/13/15 1131 Last data filed at 03/13/15 1045  Gross per 24 hour  Intake 2551.67 ml  Output   2860 ml  Net -308.33 ml   Filed Weights   03/11/15 2350 03/12/15 0534  Weight: 60.328 kg (133 lb) 61.1 kg (134 lb 11.2 oz)     Exam:  General exam: NAD Respiratory system: Clear. No increased work of breathing. Cardiovascular system: S1 & S2 heard, RRR.  Gastrointestinal system: Abdomen is nondistended, soft. No tenderness   Normal bowel sounds heard. Central nervous system: Alert and oriented. No focal neurological deficits. Extremities: Symmetric 5 x 5 power.   Data Reviewed: Basic Metabolic Panel:  Recent Labs Lab 03/12/15 0038 03/12/15 0704 03/13/15 0520  NA 133* 136 138  K 3.0* 3.6 3.1*  CL 92* 94* 103  CO2 31 29 27   GLUCOSE 116* 124* 104*  BUN 24* 17  13  CREATININE 1.41* 1.42* 0.91  CALCIUM 8.8* 9.2 8.5*  MG  --  2.0  --    Liver Function Tests:  Recent Labs Lab 03/12/15 0038  AST 33  ALT 29  ALKPHOS 73  BILITOT 0.6  PROT 5.7*  ALBUMIN 3.3*    Recent Labs Lab 03/12/15 0038  LIPASE 29   No results for input(s): AMMONIA in the last 168 hours. CBC:  Recent  Labs Lab 03/12/15 0038 03/12/15 0704 03/13/15 0520  WBC 7.5 13.0* 6.8  NEUTROABS 7.0 12.5* 5.5  HGB 14.7 15.5* 12.8  HCT 42.6 45.7 37.6  MCV 90.1 91.2 91.5  PLT 121* 139* 101*   Cardiac Enzymes: No results for input(s): CKTOTAL, CKMB, CKMBINDEX, TROPONINI in the last 168 hours. BNP (last 3 results) No results for input(s): PROBNP in the last 8760 hours. CBG: No results for input(s): GLUCAP in the last 168 hours.  No results found for this or any previous visit (from the past 240 hour(s)).         Studies: Ct Abdomen Pelvis W Contrast  03/12/2015  CLINICAL DATA:  Diffuse abdominal pain, onset yesterday. EXAM: CT ABDOMEN AND PELVIS WITH CONTRAST TECHNIQUE: Multidetector CT imaging of the abdomen and pelvis was performed using the standard protocol following bolus administration of intravenous contrast. CONTRAST:  63m OMNIPAQUE IOHEXOL 300 MG/ML  SOLN COMPARISON:  Abdominal ultrasound earlier this day. FINDINGS: Lower chest:  The included lung bases are clear. Liver: Homogeneous in density.  No focal lesion. Hepatobiliary: Gallbladder physiologically distended. Small calcified gallstone noted. No biliary dilatation. Pancreas: Atrophic. No ductal dilatation or peripancreatic inflammatory change. Spleen: Normal. Adrenal glands: No nodule. Kidneys: Right perinephric stranding with urothelial enhancement consistent with pyelonephritis. There is a 2.5 cm simple cyst in the lower right kidney. Additional tiny cortical hypodensities present, too small to characterize. No perirenal or intrarenal fluid collection. Homogeneous enhancement of the left kidney. Nonobstructing punctate stone in the left lower pole. Small nonenhancing cyst in the left kidney, with additional tiny cortical hypodensities. Stomach/Bowel: Stomach physiologically distended. There are no dilated or thickened small bowel loops. Small volume of stool throughout the colon without colonic wall thickening. Multifocal  diverticulosis throughout the colon without diverticulitis. The appendix is not visualized. Vascular/Lymphatic: No retroperitoneal adenopathy. Abdominal aorta is normal in caliber, tortuous in its course. Mild atherosclerosis. Reproductive: Uterus is surgically absent.  No adnexal mass. Bladder: Physiologically distended. Minimal perivesicular stranding about the dome. Other: No free air, free fluid, or intra-abdominal fluid collection. Tiny fat containing umbilical hernia. Musculoskeletal: There are no acute or suspicious osseous abnormalities. Hemi transitional lumbosacral anatomy. IMPRESSION: 1. Findings consistent with right pyelonephritis. 2. Chronic findings include diverticulosis without diverticulitis, cholelithiasis, and bilateral renal cysts. Electronically Signed   By: MJeb LeveringM.D.   On: 03/12/2015 03:30   [place]domen Limited  03/12/2015  CLINICAL DATA:  Right upper quadrant pain with nausea and vomiting. Symptoms for 1 day. EXAM: [place]DOM[place] LIMITED - RIGHT UPPER QUADRANT COMPARISON:  Abdominal ultrasound 02/20/2008, MRI 02/29/2008 FINDINGS: Gallbladder: Physiologically distended. No intraluminal 1.2 cm gallstone. No wall thickening visualized. No sonographic Murphy sign noted. Common bile duct: Diameter: 4 mm, normal. Liver: No focal lesion identified. Within normal limits in parenchymal echogenicity. Normal directional flow in the main portal vein. IMPRESSION: Cholelithiasis without findings of acute cholecystitis. No biliary dilatation. Electronically Signed   By: MJeb LeveringM.D.   On: 03/12/2015 01:55   Dg Chest Port 1 View  03/12/2015  CLINICAL DATA:  Nausea and  vomiting.  Sepsis EXAM: PORTABLE CHEST 1 VIEW COMPARISON:  08/24/2013 FINDINGS: Prominent markings at the right base are considered summation shadows given the lung bases are clear on preceding abdominal CT. Stable large lung volumes. There is no edema, consolidation, effusion, or pneumothorax. Normal heart size and  stable mild aortic tortuosity. IMPRESSION: No active disease. Electronically Signed   By: Monte Fantasia M.D.   On: 03/12/2015 11:32        Scheduled Meds: . amLODipine  10 mg Oral Daily  . carbamazepine  400 mg Oral BID  . cefTRIAXone (ROCEPHIN)  IV  2 g Intravenous Q24H  . enoxaparin (LOVENOX) injection  40 mg Subcutaneous Daily  . gabapentin  300 mg Oral QHS  . metoprolol  25 mg Oral BID  . potassium chloride  40 mEq Oral Once   Continuous Infusions:    Principal Problem:   Pyelonephritis Active Problems:   Trigeminal neuralgia   Essential hypertension   ARF (acute renal failure) (HCC)   Gallstones    Time spent: 25 minutes.    Eulogio Bear, DO [facility] Pager [phone]  If 7PM-7AM, please contact night-coverage [email or web address] Password [email or web address] 03/13/2015, 11:31 AM    LOS: 1 day              And

## 2015-03-14 LAB — URINE CULTURE: Culture: >=100000

## 2015-03-14 MED ORDER — CEFUROXIME AXETIL 500 MG PO TABS
500.0000 mg | ORAL_TABLET | Freq: Two times a day (BID) | ORAL | Status: DC
  Filled 2015-03-14: qty 1

## 2015-03-14 MED ORDER — CEFUROXIME AXETIL 500 MG PO TABS: 500.0000 mg | ORAL_TABLET | Freq: Two times a day (BID) | ORAL | Status: DC

## 2015-03-14 NOTE — Discharge Summary (Signed)
Physician Discharge Summary  [name] HAL:[id number] DOB: 12/28/1940 DOA: 03/11/2015  PCP: [name], MD  Admit date: 03/11/2015 Discharge date: 03/14/2015  Time spent: 35 minutes  Recommendations for Outpatient Follow-up:  1. See below  Discharge Diagnoses:  Principal Problem:   Pyelonephritis Active Problems:   Trigeminal neuralgia   Essential hypertension   ARF (acute renal failure) ([place])   Gallstones   Discharge Condition: improved  Diet recommendation: cardiac  Filed Weights   03/11/15 2350 03/12/15 0534  Weight: 60.328 kg (133 lb) 61.1 kg (134 lb 11.2 oz)    History of present illness:  74 year old female with history of HTN, HLD, GERD, trigeminal neuralgia, presented to [facility] ED on 03/11/15 because of generalized abdominal pain, right flank pain/RUQ pain which worsened 24 hours prior to admission, an episode of nonbloody emesis 2 days prior. She denies fevers. Had a loose stool after mag citrate. In the ED CT confirmed right pyelonephritis. CT abdomen and RUQ ultrasound also demonstrated gallstones without cholecystitis. She did not meet sepsis criteria on initial arrival last night. However this morning, she has fever of 102.9, pulse 111/m, WBC 13. She is being managed for sepsis secondary to acute right pyelonephritis.  Hospital Course:  Sepsis, not present on admission-secondary to acute right pyelonephritis -resolved.  Acute right pyelonephritis - Patient confirms dysuria, abdominal and right flank pain. - CT abdomen confirms acute right pyelonephritis. - ecoli -treat with PO ceftin  Hypokalemia - Replace  Acute kidney injury  - Likely secondary to sepsis and dehydration - Avoid nephrotoxic medications-hold ACE inhibitor & chlorthalidone for now - Patient did receive IV contrast for CT scan on admission.  Dehydration - IV fluids.   Thrombocytopenia - May be related to sepsis. Treat underlying cause  Abdominal pain - Likely secondary  to acute pyelonephritis. Low index of suspicion for acute cholecystitis. HIDA negative - Diet as tolerated and monitor.  Essential hypertension - Controlled. Continue metoprolol & amlodipine. Hold ACEI & diuretics due to acute kidney injury  Trigeminal neuralgia - Continue carbamazepine and gabapentin  Hypokalemia -replete  Procedures:    Consultations:    Discharge Exam: Filed Vitals:   03/14/15 0508  BP: 150/87  Pulse: 76  Temp: 99.2 F (37.3 C)  Resp: 18    General: awake, NAD   Discharge Instructions   Discharge Instructions    Diet - low sodium heart healthy    Complete by:  As directed      Discharge instructions    Complete by:  As directed   BMP 1 week     Increase activity slowly    Complete by:  As directed           Current Discharge Medication List    START taking these medications   Details  cefUROXime (CEFTIN) 500 MG tablet Take 1 tablet (500 mg total) by mouth 2 (two) times daily with a meal. Qty: 24 tablet, Refills: 0      CONTINUE these medications which have NOT CHANGED   Details  albuterol (PROVENTIL HFA;VENTOLIN HFA) 108 (90 BASE) MCG/ACT inhaler Inhale 2 puffs into the lungs every 6 (six) hours as needed. Qty: 1 Inhaler, Refills: 2    amLODipine (NORVASC) 10 MG tablet TAKE ONE TABLET BY MOUTH ONCE DAILY Qty: 90 tablet, Refills: 1    benazepril (LOTENSIN) 20 MG tablet TAKE ONE TABLET BY MOUTH ONCE DAILY Qty: 90 tablet, Refills: 1    butalbital-aspirin-caffeine-codeine (FIORINAL WITH CODEINE) 50-325-40-30 MG capsule Take 1 capsule by mouth every  4 (four) hours as needed for migraine. 1-2 by mouth every 4-6 hours as needed max of 6 tabs in 24 hours Qty: 30 capsule, Refills: 0    chlorthalidone (HYGROTON) 25 MG tablet TAKE ONE TABLET BY MOUTH ONCE DAILY Qty: 30 tablet, Refills: 4    EPITOL 200 MG tablet TAKE TWO TABLETS BY MOUTH TWICE DAILY Qty: 360 tablet, Refills: 1    gabapentin (NEURONTIN) 300 MG capsule Take 1 capsule  (300 mg total) by mouth at bedtime. Qty: 30 capsule, Refills: 2    metoprolol (LOPRESSOR) 50 MG tablet TAKE ONE-HALF TABLET BY MOUTH TWICE DAILY Qty: 30 tablet, Refills: 5       Allergies  Allergen Reactions  . Sulfamethoxazole-Trimethoprim     REACTION: unspecified   Follow-up Information    Follow up with [name], MD In 1 week.   Specialties:  Internal Medicine, Pediatrics   Contact information:   [place] [address] [phone]        The results of significant diagnostics from this hospitalization (including imaging, microbiology, ancillary and laboratory) are listed below for reference.    Significant Diagnostic Studies: Nm Hepatobiliary Liver Func  03/13/2015  CLINICAL DATA:  Right upper quadrant pain and nausea and vomiting for 3 days. Cholelithiasis. EXAM: NUCLEAR MEDICINE HEPATOBILIARY IMAGING TECHNIQUE: Sequential images of the abdomen were obtained out to 60 minutes following intravenous administration of radiopharmaceutical. RADIOPHARMACEUTICALS:  5.3 mCi Tc-22m Choletec IV COMPARISON:  None. FINDINGS: Radiopharmaceutical uptake by liver and biliary excretion of activity demonstrated. Liver is unremarkable in appearance. Gallbladder filling is demonstrated, consistent with patent cystic duct. Biliary activity also seen entering small bowel, consistent with patent common bile duct. IMPRESSION: Normal study.  Patent cystic and common bile ducts demonstrated. Electronically Signed   By: JEarle GellM.D.   On: 03/13/2015 13:21   Ct Abdomen Pelvis W Contrast  03/12/2015  CLINICAL DATA:  Diffuse abdominal pain, onset yesterday. EXAM: CT ABDOMEN AND PELVIS WITH CONTRAST TECHNIQUE: Multidetector CT imaging of the abdomen and pelvis was performed using the standard protocol following bolus administration of intravenous contrast. CONTRAST:  811mOMNIPAQUE IOHEXOL 300 MG/ML  SOLN COMPARISON:  Abdominal ultrasound earlier this day. FINDINGS:  Lower chest:  The included lung bases are clear. Liver: Homogeneous in density.  No focal lesion. Hepatobiliary: Gallbladder physiologically distended. Small calcified gallstone noted. No biliary dilatation. Pancreas: Atrophic. No ductal dilatation or peripancreatic inflammatory change. Spleen: Normal. Adrenal glands: No nodule. Kidneys: Right perinephric stranding with urothelial enhancement consistent with pyelonephritis. There is a 2.5 cm simple cyst in the lower right kidney. Additional tiny cortical hypodensities present, too small to characterize. No perirenal or intrarenal fluid collection. Homogeneous enhancement of the left kidney. Nonobstructing punctate stone in the left lower pole. Small nonenhancing cyst in the left kidney, with additional tiny cortical hypodensities. Stomach/Bowel: Stomach physiologically distended. There are no dilated or thickened small bowel loops. Small volume of stool throughout the colon without colonic wall thickening. Multifocal diverticulosis throughout the colon without diverticulitis. The appendix is not visualized. Vascular/Lymphatic: No retroperitoneal adenopathy. Abdominal aorta is normal in caliber, tortuous in its course. Mild atherosclerosis. Reproductive: Uterus is surgically absent.  No adnexal mass. Bladder: Physiologically distended. Minimal perivesicular stranding about the dome. Other: No free air, free fluid, or intra-abdominal fluid collection. Tiny fat containing umbilical hernia. Musculoskeletal: There are no acute or suspicious osseous abnormalities. Hemi transitional lumbosacral anatomy. IMPRESSION: 1. Findings consistent with right pyelonephritis. 2. Chronic findings include diverticulosis without diverticulitis, cholelithiasis, and  bilateral renal cysts. Electronically Signed   By: Jeb Levering M.D.   On: 03/12/2015 03:30   US Abdomen Limited  03/12/2015  CLINICAL DATA:  Right upper quadrant pain with nausea and vomiting. Symptoms for 1 day. EXAM:  US ABDOMEN LIMITED - RIGHT UPPER QUADRANT COMPARISON:  Abdominal ultrasound 02/20/2008, MRI 02/29/2008 FINDINGS: Gallbladder: Physiologically distended. No intraluminal 1.2 cm gallstone. No wall thickening visualized. No sonographic Murphy sign noted. Common bile duct: Diameter: 4 mm, normal. Liver: No focal lesion identified. Within normal limits in parenchymal echogenicity. Normal directional flow in the main portal vein. IMPRESSION: Cholelithiasis without findings of acute cholecystitis. No biliary dilatation. Electronically Signed   By: Jeb Levering M.D.   On: 03/12/2015 01:55   Dg Chest Port 1 View  03/12/2015  CLINICAL DATA:  Nausea and vomiting.  Sepsis EXAM: PORTABLE CHEST 1 VIEW COMPARISON:  08/24/2013 FINDINGS: Prominent markings at the right base are considered summation shadows given the lung bases are clear on preceding abdominal CT. Stable large lung volumes. There is no edema, consolidation, effusion, or pneumothorax. Normal heart size and stable mild aortic tortuosity. IMPRESSION: No active disease. Electronically Signed   By: Monte Fantasia M.D.   On: 03/12/2015 11:32    Microbiology: Recent Results (from the past 240 hour(s))  Culture, Urine     Status: None   Collection Time: 03/12/15 12:20 AM  Result Value Ref Range Status   Specimen Description URINE, RANDOM  Final   Special Requests ADDED [id number]  Final   Culture >=100,000 COLONIES/mL ESCHERICHIA COLI  Final   Report Status 03/14/2015 FINAL  Final   Organism ID, Bacteria ESCHERICHIA COLI  Final      Susceptibility   Escherichia coli - MIC*    AMPICILLIN >=32 RESISTANT Resistant     CEFAZOLIN <=4 SENSITIVE Sensitive     CEFTRIAXONE <=1 SENSITIVE Sensitive     CIPROFLOXACIN <=0.25 SENSITIVE Sensitive     GENTAMICIN <=1 SENSITIVE Sensitive     IMIPENEM <=0.25 SENSITIVE Sensitive     NITROFURANTOIN <=16 SENSITIVE Sensitive     TRIMETH/SULFA >=320 RESISTANT Resistant     AMPICILLIN/SULBACTAM 16 INTERMEDIATE  Intermediate     PIP/TAZO <=4 SENSITIVE Sensitive     * >=100,000 COLONIES/mL ESCHERICHIA COLI  Culture, blood (x 2)     Status: None (Preliminary result)   Collection Time: 03/12/15 10:36 AM  Result Value Ref Range Status   Specimen Description BLOOD LEFT HAND  Final   Special Requests BOTTLES DRAWN AEROBIC AND ANAEROBIC 10CC 6CC  Final   Culture NO GROWTH 2 DAYS  Final   Report Status PENDING  Incomplete  Culture, blood (x 2)     Status: None (Preliminary result)   Collection Time: 03/12/15 10:46 AM  Result Value Ref Range Status   Specimen Description BLOOD LEFT HAND  Final   Special Requests BOTTLES DRAWN AEROBIC ONLY 8CC  Final   Culture NO GROWTH 2 DAYS  Final   Report Status PENDING  Incomplete     Labs: Basic Metabolic Panel:  Recent Labs Lab 03/12/15 0038 03/12/15 0704 03/13/15 0520  NA 133* 136 138  K 3.0* 3.6 3.1*  CL 92* 94* 103  CO2 31 29 27   GLUCOSE 116* 124* 104*  BUN 24* 17 13  CREATININE 1.41* 1.42* 0.91  CALCIUM 8.8* 9.2 8.5*  MG  --  2.0  --    Liver Function Tests:  Recent Labs Lab 03/12/15 0038  AST 33  ALT 29  ALKPHOS 73  BILITOT 0.6  PROT 5.7*  ALBUMIN 3.3*    Recent Labs Lab 03/12/15 0038  LIPASE 29   No results for input(s): AMMONIA in the last 168 hours. CBC:  Recent Labs Lab 03/12/15 0038 03/12/15 0704 03/13/15 0520  WBC 7.5 13.0* 6.8  NEUTROABS 7.0 12.5* 5.5  HGB 14.7 15.5* 12.8  HCT 42.6 45.7 37.6  MCV 90.1 91.2 91.5  PLT 121* 139* 101*   Cardiac Enzymes: No results for input(s): CKTOTAL, CKMB, CKMBINDEX, TROPONINI in the last 168 hours. BNP: BNP (last 3 results) No results for input(s): BNP in the last 8760 hours.  ProBNP (last 3 results) No results for input(s): PROBNP in the last 8760 hours.  CBG: No results for input(s): GLUCAP in the last 168 hours.     SignedEulogio Bear  [facility] 03/14/2015, 10:28 AM

## 2015-03-14 NOTE — Progress Notes (Signed)
Patient was discharged home by MD order; discharged instructions review and give to patient with care notes and prescriptions; IV DIC; skin intact; patient will be escorted to the car by nurse tech via wheelchair.  

## 2015-03-17 ENCOUNTER — Telehealth: Payer: Self-pay | Admitting: Internal Medicine

## 2015-03-17 LAB — CULTURE, BLOOD (ROUTINE X 2)
Culture: NO GROWTH
Culture: NO GROWTH

## 2015-03-17 NOTE — Telephone Encounter (Signed)
Pt had kidney infection and stayed in hospital 4 days. dc'd sat, 10/22 Pt instructed to fu w/ pcp w/in a week. Pt cannot come in Nov 3 or 4th. Only same day appts. pls advise Thanks!

## 2015-03-17 NOTE — Telephone Encounter (Signed)
Not sure I  understand the message    We can work her in the week of nov  1

## 2015-03-18 NOTE — Telephone Encounter (Signed)
Pt has been scheduled.  °

## 2015-03-23 ENCOUNTER — Ambulatory Visit (INDEPENDENT_AMBULATORY_CARE_PROVIDER_SITE_OTHER): Payer: Medicare Other | Admitting: Internal Medicine

## 2015-03-23 ENCOUNTER — Encounter: Payer: Self-pay | Admitting: Internal Medicine

## 2015-03-23 VITALS — BP 122/78 | Temp 97.7°F | Ht 66.0 in | Wt 137.3 lb

## 2015-03-23 DIAGNOSIS — Z23 Encounter for immunization: Secondary | ICD-10-CM | POA: Diagnosis not present

## 2015-03-23 DIAGNOSIS — K5900 Constipation, unspecified: Secondary | ICD-10-CM

## 2015-03-23 DIAGNOSIS — Z8719 Personal history of other diseases of the digestive system: Secondary | ICD-10-CM

## 2015-03-23 DIAGNOSIS — Z09 Encounter for follow-up examination after completed treatment for conditions other than malignant neoplasm: Secondary | ICD-10-CM

## 2015-03-23 DIAGNOSIS — N12 Tubulo-interstitial nephritis, not specified as acute or chronic: Secondary | ICD-10-CM | POA: Diagnosis not present

## 2015-03-23 DIAGNOSIS — I10 Essential (primary) hypertension: Secondary | ICD-10-CM

## 2015-03-23 DIAGNOSIS — E876 Hypokalemia: Secondary | ICD-10-CM

## 2015-03-23 DIAGNOSIS — Z87898 Personal history of other specified conditions: Secondary | ICD-10-CM

## 2015-03-23 LAB — POCT URINALYSIS DIP (MANUAL ENTRY)
Bilirubin, UA: NEGATIVE
Blood, UA: NEGATIVE
Glucose, UA: NEGATIVE
Ketones, POC UA: NEGATIVE
Leukocytes, UA: NEGATIVE
Nitrite, UA: NEGATIVE
Protein Ur, POC: NEGATIVE
Spec Grav, UA: 1.02
Urobilinogen, UA: 0.2
pH, UA: 6

## 2015-03-23 MED ORDER — ALBUTEROL SULFATE HFA 108 (90 BASE) MCG/ACT IN AERS: 2.0000 | INHALATION_SPRAY | Freq: Four times a day (QID) | RESPIRATORY_TRACT | Status: DC | PRN

## 2015-03-23 NOTE — Addendum Note (Signed)
Addended by: Miles Costain T on: 03/23/2015 04:17 PM   Modules accepted: Orders

## 2015-03-23 NOTE — Patient Instructions (Signed)
Finish antibiotic as planned . Glad you are doing better . Keep lab and cpx appt  Next weeks . Expect continued improvement in the next weeks.  Your bp is good today. Constipation can lead to bladder dysfunction that can set you up for uti but  Doesn't cause uti or renal infection .

## 2015-03-23 NOTE — Progress Notes (Signed)
Pre visit review using our clinic review tool, if applicable. No additional management support is needed unless otherwise documented below in the visit note.  Chief Complaint  Patient presents with  . Follow-up    hosp for acute pyelo sepsis criteria     HPI: Patient come in for follow up from hosp  Visit for rpyelopnepthritis  And acute renal failure  For  Ct abd  pelo cw adn gallstones  spsis criteria after admission  ace ciuretic was help in hospitalization  hida scan was negative  10 19 - 10 22  dced on ceftin  24 500 mg  ucx e coli s to cefzol   Urine was clear .     At urology office  Dr Janice Norrie  Who said nothing to do different  But didn't  feel good.  And finally went to be seen.    Ed when fainted and felt sick with vomiting  Also  Hs med until  until Thursday  NOv 4  Still very tired   And oding better nausea has subsided.  Not as bad. Diet change constipation.    Working on it.    BP  Has been good. Not checked  Now back on  meds after being help in hosp.  ROS: See pertinent positives and negatives per HPI. No cough  Cp sob fever  Now just very tired but much better than at admission  Past Medical History  Diagnosis Date  . GERD (gastroesophageal reflux disease)   . Hyperlipidemia   . Hypertension     echo nl lv function  mild dilitation 2009  . Migraine     hypnic HA eval by Dr. Earley Favor in the past  . Abnormal blood finding     elevated Hg and hct    . 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 Hearing aid worn   . Polycythemia   . Trigeminal neuralgia pain   . Closed head injury 02/01/2011    from syncope and had scalp laceration  neg ct .    Marland Kitchen Syncope 02/01/2011    In shower on vacation  sustained head laceration  8 sutures Had ed visit neg head ct labs and x ray   . 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 Medication side effect 09/02/2010    Poss muscle se of 10 crestor   . Gall stones 2016    see  ct scan neg HIDA     Family History  Problem Relation Age of Onset  . Ovarian cancer Mother   . Stroke Mother   . Alcohol abuse Father   . Stroke Father   . Seizures Daughter   . Hypertension    . Diabetes Brother   . Cancer Paternal Aunt     leukemia, unknown type  . Colon cancer Neg Hx     Social History   Social History  . Marital Status: Married    Spouse Name: N/A  . Number of Children: 2  . Years of Education: N/A   Occupational History  .      retired Forensic psychologist   Social History Main Topics  . Smoking status: Never Smoker   . Smokeless tobacco: Never Used  . Alcohol Use: 1.2 oz/week    0 Standard drinks or equivalent, 2 Glasses of wine per week  . Drug Use: No  . Sexual  Activity: Not Asked   Other Topics Concern  . 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 albuterol (PROVENTIL HFA;VENTOLIN HFA) 108 (90 BASE) MCG/ACT inhaler Inhale 2 puffs into the lungs every 6 (six) hours as needed. 1 Inhaler 2  . amLODipine (NORVASC) 10 MG tablet TAKE ONE TABLET BY MOUTH ONCE DAILY 90 tablet 1  . benazepril (LOTENSIN) 20 MG tablet TAKE ONE TABLET BY MOUTH ONCE DAILY 90 tablet 1  . butalbital-aspirin-caffeine-codeine (FIORINAL WITH CODEINE) 50-325-40-30 MG capsule Take 1 capsule by mouth every 4 (four) hours as needed for migraine. 1-2 by mouth every 4-6 hours as needed max of 6 tabs in 24 hours 30 capsule 0  . cefUROXime (CEFTIN) 500 MG tablet Take 1 tablet (500 mg total) by mouth 2 (two) times daily with a meal. 24 tablet 0  . chlorthalidone (HYGROTON) 25 MG tablet TAKE ONE TABLET BY MOUTH ONCE DAILY 30 tablet 4  . EPITOL 200 MG tablet TAKE TWO TABLETS BY MOUTH TWICE DAILY 360 tablet 1  . metoprolol (LOPRESSOR) 50 MG tablet TAKE ONE-HALF TABLET BY MOUTH TWICE DAILY 30 tablet 5  .  gabapentin (NEURONTIN) 300 MG capsule Take 1 capsule (300 mg total) by mouth at bedtime. 30 capsule 2   No facility-administered medications prior to visit.     EXAM:  BP 122/78 mmHg  Temp(Src) 97.7 F (36.5 C) (Oral)  Ht 5' 6"  (1.676 m)  Wt 137 lb 4.8 oz (62.279 kg)  BMI 22.17 kg/m2  Body mass index is 22.17 kg/(m^2).  GENERAL: vitals reviewed and listed above, alert, oriented, appears well hydrated and in no acute distress HEENT: atraumatic, conjunctiva  clear, no obvious abnormalities on inspection of external nose and ears NECK: no obvious masses on inspection palpation  LUNGS: clear to auscultation bilaterally, no wheezes, rales or rhonchi, good air movement CV: HRRR, no clubbing cyanosis or  peripheral edema nl cap refill  Abdomen:  Sof,t normal bowel sounds without hepatosplenomegaly, no guarding rebound or masses no CVA tenderness MS: moves all extremities without noticeable focal  abnormality PSYCH: pleasant and cooperative, no obvious depression or anxiety Lab Results  Component Value Date   WBC 6.8 03/13/2015   HGB 12.8 03/13/2015   HCT 37.6 03/13/2015   PLT 101* 03/13/2015   GLUCOSE 104* 03/13/2015   CHOL 230* 11/11/2013   TRIG 51.0 11/11/2013   HDL 117.70 11/11/2013   LDLDIRECT 135.9 10/16/2012   LDLCALC 102* 11/11/2013   ALT 29 03/12/2015   AST 33 03/12/2015   NA 138 03/13/2015   K 3.1* 03/13/2015   CL 103 03/13/2015   CREATININE 0.91 03/13/2015   BUN 13 03/13/2015   CO2 27 03/13/2015   TSH 0.66 11/11/2013   INR 1.23 03/12/2015   BP Readings from Last 3 Encounters:  03/23/15 122/78  03/14/15 150/87  02/05/15 120/76   Wt Readings from Last 3 Encounters:  03/23/15 137 lb 4.8 oz (62.279 kg)  03/12/15 134 lb 11.2 oz (61.1 kg)  02/05/15 137 lb 12.8 oz (62.506 kg)    ASSESSMENT AND PLAN:  Discussed the following assessment and plan:  Pyelonephritis - hx ecoli  sepsis criteria better on meds see ct scan  - Plan:  POCT urinalysis  dipstick  Essential hypertension - controlled   History of nausea - worse with pyelo but had some with nl ua will follow ch for uti if worsens at any time  Hospital discharge follow-up  Need for 23-polyvalent pneumococcal polysaccharide vaccine - Plan: Pneumococcal polysaccharide vaccine 23-valent greater than or equal to 2yo subcutaneous/IM  Hypokalemia - recheck next week.   Constipation, unspecified constipation type - diet alterations to control Due for routine labs next week    Keep appt to check cr and potassium .  -Patient advised to return or notify health care team  if symptoms worsen ,persist or new concerns arise. Total visit 62mns > 50% spent counseling and coordinating care as indicated in above note and in instructions to patient .   Patient Instructions  Finish antibiotic as planned . Glad you are doing better . Keep lab and cpx appt  Next weeks . Expect continued improvement in the next weeks.  Your bp is good today. Constipation can lead to bladder dysfunction that can set you up for uti but  Doesn't cause uti or renal infection .     WStandley Brooking [name] M.D.

## 2015-03-30 ENCOUNTER — Other Ambulatory Visit (INDEPENDENT_AMBULATORY_CARE_PROVIDER_SITE_OTHER): Payer: Medicare Other

## 2015-03-30 DIAGNOSIS — Z0000 Encounter for general adult medical examination without abnormal findings: Secondary | ICD-10-CM

## 2015-03-30 DIAGNOSIS — E785 Hyperlipidemia, unspecified: Secondary | ICD-10-CM

## 2015-03-30 LAB — CBC WITH DIFFERENTIAL/PLATELET
Basophils Absolute: 0 10*3/uL (ref 0.0–0.1)
Basophils Relative: 1 % (ref 0.0–3.0)
Eosinophils Absolute: 0.2 10*3/uL (ref 0.0–0.7)
Eosinophils Relative: 3.9 % (ref 0.0–5.0)
HCT: 43 % (ref 36.0–46.0)
Hemoglobin: 14.1 g/dL (ref 12.0–15.0)
Lymphocytes Relative: 25.8 % (ref 12.0–46.0)
Lymphs Abs: 1 10*3/uL (ref 0.7–4.0)
MCHC: 32.8 g/dL (ref 30.0–36.0)
MCV: 91.6 fl (ref 78.0–100.0)
Monocytes Absolute: 0.3 10*3/uL (ref 0.1–1.0)
Monocytes Relative: 8.6 % (ref 3.0–12.0)
Neutro Abs: 2.4 10*3/uL (ref 1.4–7.7)
Neutrophils Relative %: 60.7 % (ref 43.0–77.0)
Platelets: 308 10*3/uL (ref 150.0–400.0)
RBC: 4.69 Mil/uL (ref 3.87–5.11)
RDW: 13.4 % (ref 11.5–15.5)
WBC: 4 10*3/uL (ref 4.0–10.5)

## 2015-03-30 LAB — HEPATIC FUNCTION PANEL
ALT: 17 U/L (ref 0–35)
AST: 19 U/L (ref 0–37)
Albumin: 3.5 g/dL (ref 3.5–5.2)
Alkaline Phosphatase: 74 U/L (ref 39–117)
Bilirubin, Direct: 0.1 mg/dL (ref 0.0–0.3)
Total Bilirubin: 0.7 mg/dL (ref 0.2–1.2)
Total Protein: 5.9 g/dL — ABNORMAL LOW (ref 6.0–8.3)

## 2015-03-30 LAB — BASIC METABOLIC PANEL
BUN: 23 mg/dL (ref 6–23)
CO2: 30 mEq/L (ref 19–32)
Calcium: 9.6 mg/dL (ref 8.4–10.5)
Chloride: 102 mEq/L (ref 96–112)
Creatinine, Ser: 0.79 mg/dL (ref 0.40–1.20)
GFR: 75.26 mL/min (ref >60.00)
Glucose, Bld: 97 mg/dL (ref 70–99)
Potassium: 4.3 mEq/L (ref 3.5–5.1)
Sodium: 141 mEq/L (ref 135–145)

## 2015-03-30 LAB — LIPID PANEL
Cholesterol: 204 mg/dL — ABNORMAL HIGH (ref 0–200)
HDL: 77.2 mg/dL (ref >39.00)
LDL Cholesterol: 117 mg/dL — ABNORMAL HIGH (ref 0–99)
NonHDL: 126.38
Total CHOL/HDL Ratio: 3
Triglycerides: 45 mg/dL (ref 0.0–149.0)
VLDL: 9 mg/dL (ref 0.0–40.0)

## 2015-03-30 LAB — TSH: TSH: 0.68 u[IU]/mL (ref 0.35–4.50)

## 2015-04-06 ENCOUNTER — Ambulatory Visit (INDEPENDENT_AMBULATORY_CARE_PROVIDER_SITE_OTHER): Payer: Medicare Other | Admitting: Internal Medicine

## 2015-04-06 ENCOUNTER — Encounter: Payer: Self-pay | Admitting: Internal Medicine

## 2015-04-06 VITALS — BP 126/80 | Temp 98.0°F | Ht 65.5 in | Wt 136.9 lb

## 2015-04-06 DIAGNOSIS — I10 Essential (primary) hypertension: Secondary | ICD-10-CM

## 2015-04-06 DIAGNOSIS — Z0000 Encounter for general adult medical examination without abnormal findings: Secondary | ICD-10-CM | POA: Diagnosis not present

## 2015-04-06 DIAGNOSIS — Z79899 Other long term (current) drug therapy: Secondary | ICD-10-CM

## 2015-04-06 NOTE — Progress Notes (Signed)
Pre visit review using our clinic review tool, if applicable. No additional management support is needed unless otherwise documented below in the visit note.  Chief Complaint  Patient presents with  . Medicare Wellness    HPI: [name] 74 y.o. comes in today for Preventive Medicare wellness visit .Since last visit.  Doing ok   Bp back on reg meds  No potassium supp.   Meds  Falls   Every once in a while but not now .  When Turns quickly  And carrying things .  adapted  To now fall.  Headaches  : Dr. Arletha Grippe not helpful   And   To go back to neuro in feb.   For now excedrin migraine.  Only rare use of the esgic Taking prilosec every day .   To help HB   Could use rantitidine  Has helped in the apst  Health Maintenance  Topic Date Due  . DEXA SCAN  07/14/2004  . INFLUENZA VACCINE  12/22/2015  . TETANUS/TDAP  09/18/2021  . COLONOSCOPY  04/01/2024  . ZOSTAVAX  Completed  . PNA vac Low Risk Adult  Completed   Health Maintenance Review LIFESTYLE:  TADnet to d social etoh Sugar beverages:nSleep: better    MEDICARE DOCUMENT QUESTIONS  TO SCAN     Hearing: hearing aids   Vision:  No limitations at present . Last eye check UTD  Safety:  Has smoke detector and wears seat belts.  No firearms. No excess sun exposure. Sees dentist regularly.  Falls:  ocass see text  Non recnetly   Advance directive :  Reviewed  Has one.  Memory: Felt to be good  , no concern from her or her family.  Depression: No anhedonia unusual crying or depressive symptoms doing better visiting with grandkids   Nutrition: Eats well balanced diet; adequate calcium and vitamin D. No swallowing chewing problems.  Injury: no major injuries in the last six months.  Other healthcare providers:  Reviewed today .  Social:  Lives with spouse married.  Preventive parameters: up-to-date  Reviewed   ADLS:   There are no problems or need for assistance  driving, feeding, obtaining food,  dressing, toileting and bathing, managing money using phone. She is independent.   ROS:  GEN/ HEENT: No fever, significant weight changes sweats  HAs taking excedrin migraine at persent  vision problems hearing changes, CV/ PULM; No chest pain shortness of breath cough, syncope,edema  change in exercise tolerance. GI /GU: No adominal pain, vomiting, change in bowel habits. No blood in the stool. No significant GU symptoms. SKIN/HEME: ,no acute skin rashes suspicious lesions or bleeding. No lymphadenopathy, nodules, masses.  NEURO/ PSYCH:  No neurologic signs such as weakness numbness. No depression anxiety. IMM/ Allergy: No unusual infections.  Allergy .   REST of 12 system review negative except as per HPI   Past Medical History  Diagnosis Date  . GERD (gastroesophageal reflux disease)   . Hyperlipidemia   . Hypertension     echo nl lv function  mild dilitation 2009  . Migraine     hypnic HA eval by Dr. Earley Favor in the past  . Abnormal blood finding     elevated Hg and hct    . 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 Hearing aid worn   . Polycythemia   . Trigeminal neuralgia pain   . Closed head injury 02/01/2011    from syncope and had scalp  laceration  neg ct .    Marland Kitchen Syncope 02/01/2011    In shower on vacation  sustained head laceration  8 sutures Had ed visit neg head ct labs and x ray   . 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 Medication side effect 09/02/2010    Poss muscle se of 10 crestor   . Gall stones 2016    see ct scan neg HIDA     Family History  Problem Relation Age of Onset  . Ovarian cancer Mother   . Stroke Mother   . Alcohol abuse Father   . Stroke Father   . Seizures Daughter   . Hypertension    . Diabetes Brother   . Cancer Paternal Aunt     leukemia, unknown type  . Colon cancer Neg Hx     Social History   Social History  . Marital Status: Married    Spouse Name: N/A  .  Number of Children: 2  . Years of Education: N/A   Occupational History  .      retired Forensic psychologist   Social History Main Topics  . Smoking status: Never Smoker   . Smokeless tobacco: Never Used  . Alcohol Use: 1.2 oz/week    0 Standard drinks or equivalent, 2 Glasses of wine per week  . Drug Use: No  . Sexual Activity: Not Asked   Other Topics Concern  . None   Social History Narrative   Married   HH of 2-3 (god daughter)   Pets 2 dogs   Non smoker    Child is a physician   G2P2                Outpatient Encounter Prescriptions as of 04/06/2015  Medication Sig  . albuterol (PROVENTIL HFA;VENTOLIN HFA) 108 (90 BASE) MCG/ACT inhaler Inhale 2 puffs into the lungs every 6 (six) hours as needed.  Marland Kitchen amLODipine (NORVASC) 10 MG tablet TAKE ONE TABLET BY MOUTH ONCE DAILY  . benazepril (LOTENSIN) 20 MG tablet TAKE ONE TABLET BY MOUTH ONCE DAILY  . butalbital-aspirin-caffeine-codeine (FIORINAL WITH CODEINE) 50-325-40-30 MG capsule Take 1 capsule by mouth every 4 (four) hours as needed for migraine. 1-2 by mouth every 4-6 hours as needed max of 6 tabs in 24 hours  . chlorthalidone (HYGROTON) 25 MG tablet TAKE ONE TABLET BY MOUTH ONCE DAILY  . EPITOL 200 MG tablet TAKE TWO TABLETS BY MOUTH TWICE DAILY  . metoprolol (LOPRESSOR) 50 MG tablet TAKE ONE-HALF TABLET BY MOUTH TWICE DAILY  . [DISCONTINUED] cefUROXime (CEFTIN) 500 MG tablet Take 1 tablet (500 mg total) by mouth 2 (two) times daily with a meal.   No facility-administered encounter medications on file as of 04/06/2015.    EXAM:  BP 126/80 mmHg  Temp(Src) 98 F (36.7 C) (Oral)  Ht 5' 5.5" (1.664 m)  Wt 136 lb 14.4 oz (62.097 kg)  BMI 22.43 kg/m2  Body mass index is 22.43 kg/(m^2).  Physical Exam: Vital signs reviewed MPN:TIRW is a well-developed well-nourished alert cooperative   who appears stated age in no acute distress.  HEENT: normocephalic atraumatic , Eyes: PERRL EOM's full, conjunctiva clear, Nares:  paten,t no deformity discharge or tenderness., Ears: no deformity EAC's clear TMs with normal landmarks. Mouth: clear OP, no lesions, edema.  Moist mucous membranes. Dentition in adequate repair. NECK: supple without masses, thyromegaly or bruits. CHEST/PULM:  Clear to  auscultation and percussion breath sounds equal no wheeze , rales or rhonchi. No chest wall deformities or tenderness.Breast: normal by inspection . No dimpling, discharge, masses, tenderness or discharge . CV: PMI is nondisplaced, S1 S2 no gallops, murmurs, rubs. Peripheral pulses are full without delay.No JVD .  ABDOMEN: Bowel sounds normal nontender  No guard or rebound, no hepato splenomegal no CVA tenderness.   Extremtities:  No clubbing cyanosis or edema, no acute joint swelling or redness no focal atrophy NEURO:  Oriented x3, cranial nerves 3-12 appear to be intact, no obvious focal weakness,gait within normal limits no abnormal reflexes or asymmetrical SKIN: No acute rashes normal turgor, color, no bruising or petechiae. Senile ecchymosis  forarms  PSYCH: Oriented, good eye contact, no obvious depression anxiety, cognition and judgment appear normal. LN: no cervical axillary inguinal adenopathy No noted deficits in memory, attention, and speech.   Lab Results  Component Value Date   WBC 4.0 03/30/2015   HGB 14.1 03/30/2015   HCT 43.0 03/30/2015   PLT 308.0 03/30/2015   GLUCOSE 97 03/30/2015   CHOL 204* 03/30/2015   TRIG 45.0 03/30/2015   HDL 77.20 03/30/2015   LDLDIRECT 135.9 10/16/2012   LDLCALC 117* 03/30/2015   ALT 17 03/30/2015   AST 19 03/30/2015   NA 141 03/30/2015   K 4.3 03/30/2015   CL 102 03/30/2015   CREATININE 0.79 03/30/2015   BUN 23 03/30/2015   CO2 30 03/30/2015   TSH 0.68 03/30/2015   INR 1.23 03/12/2015   BP Readings from Last 3 Encounters:  04/06/15 126/80  03/23/15 122/78  03/14/15 150/87   Wt Readings from Last 3 Encounters:  04/06/15 136 lb 14.4 oz (62.097 kg)  03/23/15 137 lb 4.8  oz (62.279 kg)  03/12/15 134 lb 11.2 oz (61.1 kg)    ASSESSMENT AND PLAN:  Discussed the following assessment and plan:  Visit for preventive health examination  Essential hypertension  Medication management Reviewed  hcm counseling  Screening options   Check bmp in about a month to ensure potassium  Ok then rov 6 months  Prefer  rantidine vx  ppi if needed  For long term metabolic reasons Patient Care Team: [name], MD as PCP - General Cindie Crumbly (Neurosurgery)  Patient Instructions  Try using zantac  Twice a day  May be long term safer    thatn the prilosec if helps.  Mammogram after next year   February 2016 was the last one  Consider  bone density  In next year   To assess risk .  Continue fall   Prevention  Intervnetions.  Your blood pressure is good today. Check  BMP  In another month  And if still ok potassium then see you in 6 months .      Bone Health Bones protect organs, store calcium, and anchor muscles. Good health habits, such as eating nutritious foods and exercising regularly, are important for maintaining healthy bones. They can also help to prevent a condition that causes bones to lose density and become weak and brittle (osteoporosis). WHY IS BONE MASS IMPORTANT? Bone mass refers to the amount of bone tissue that you have. The higher your bone mass, the stronger your bones. An important step toward having healthy bones throughout life is to have strong and dense bones during childhood. A young adult who has a high bone mass is more likely to have a high bone mass later in life. Bone mass at its greatest it is called peak  bone mass. A large decline in bone mass occurs in older adults. In women, it occurs about the time of menopause. During this time, it is important to practice good health habits, because if more bone is lost than what is replaced, the bones will become less healthy and more likely to break (fracture). If you find that you have a low  bone mass, you may be able to prevent osteoporosis or further bone loss by changing your diet and lifestyle. HOW CAN I FIND OUT IF MY BONE MASS IS LOW? Bone mass can be measured with an X-ray test that is called a bone mineral density (BMD) test. This test is recommended for all women who are age 57 or older. It may also be recommended for men who are age 215 or older, or for people who are more likely to develop osteoporosis due to:  Having bones that break easily.  Having a long-term disease that weakens bones, such as kidney disease or rheumatoid arthritis.  Having menopause earlier than normal.  Taking medicine that weakens bones, such as steroids, thyroid hormones, or hormone treatment for breast cancer or prostate cancer.  Smoking.  Drinking three or more alcoholic drinks each day. WHAT ARE THE NUTRITIONAL RECOMMENDATIONS FOR HEALTHY BONES? To have healthy bones, you need to get enough of the right minerals and vitamins. Most nutrition experts recommend getting these nutrients from the foods that you eat. Nutritional recommendations vary from person to person. Ask your health care provider what is healthy for you. Here are some general guidelines. Calcium Recommendations Calcium is the most important (essential) mineral for bone health. Most people can get enough calcium from their diet, but supplements may be recommended for people who are at risk for osteoporosis. Good sources of calcium include:  Dairy products, such as low-fat or nonfat milk, cheese, and yogurt.  Dark green leafy vegetables, such as bok choy and broccoli.  Calcium-fortified foods, such as orange juice, cereal, bread, soy beverages, and tofu products.  Nuts, such as almonds. Follow these recommended amounts for daily calcium intake:  Children, age 73-3: 700 mg.  Children, age 21-8: 1,000 mg.  Children, age 82-13: 1,300 mg.  Teens, age 38-18: 1,300 mg.  Adults, age 66-50: 1,000 mg.  Adults, age  41-70:  Men: 1,000 mg.  Women: 1,200 mg.  Adults, age 216 or older: 1,200 mg.  Pregnant and breastfeeding females:  Teens: 1,300 mg.  Adults: 1,000 mg. Vitamin D Recommendations Vitamin D is the most essential vitamin for bone health. It helps the body to absorb calcium. Sunlight stimulates the skin to make vitamin D, so be sure to get enough sunlight. If you live in a cold climate or you do not get outside often, your health care provider may recommend that you take vitamin D supplements. Good sources of vitamin D in your diet include:  Egg yolks.  Saltwater fish.  Milk and cereal fortified with vitamin D. Follow these recommended amounts for daily vitamin D intake:  Children and teens, age 73-18: 600 international units.  Adults, age 72 or younger: 400-800 international units.  Adults, age 76 or older: 800-1,000 international units. Other Nutrients Other nutrients for bone health include:  Phosphorus. This mineral is found in meat, poultry, dairy foods, nuts, and legumes. The recommended daily intake for adult men and adult women is 700 mg.  Magnesium. This mineral is found in seeds, nuts, dark green vegetables, and legumes. The recommended daily intake for adult men is 400-420 mg. For adult women,  it is 310-320 mg.  Vitamin K. This vitamin is found in green leafy vegetables. The recommended daily intake is 120 mg for adult men and 90 mg for adult women. WHAT TYPE OF PHYSICAL ACTIVITY IS BEST FOR BUILDING AND MAINTAINING HEALTHY BONES? Weight-bearing and strength-building activities are important for building and maintaining peak bone mass. Weight-bearing activities cause muscles and bones to work against gravity. Strength-building activities increases muscle strength that supports bones. Weight-bearing and muscle-building activities include:  Walking and hiking.  Jogging and running.  Dancing.  Gym exercises.  Lifting weights.  Tennis and racquetball.  Climbing  stairs.  Aerobics. Adults should get at least 30 minutes of moderate physical activity on most days. Children should get at least 60 minutes of moderate physical activity on most days. Ask your health care provide what type of exercise is best for you. WHERE CAN I FIND MORE INFORMATION? For more information, check out the following websites:  [place]: YardHomes.se  [employer] of Health: http://www.niams.AnonymousEar.fr.asp   This information is not intended to replace advice given to you by your health care provider. Make sure you discuss any questions you have with your health care provider.   Document Released: 07/30/2003 Document Revised: 09/23/2014 Document Reviewed: 05/14/2014 Elsevier Interactive Patient Education 2016 [place] Maintenance, Female Adopting a healthy lifestyle and getting preventive care can go a long way to promote health and wellness. Talk with your health care provider about what schedule of regular examinations is right for you. This is a good chance for you to check in with your provider about disease prevention and staying healthy. In between checkups, there are plenty of things you can do on your own. Experts have done a lot of research about which lifestyle changes and preventive measures are most likely to keep you healthy. Ask your health care provider for more information. WEIGHT AND DIET  Eat a healthy diet  Be sure to include plenty of vegetables, fruits, low-fat dairy products, and lean protein.  Do not eat a lot of foods high in solid fats, added sugars, or salt.  Get regular exercise. This is one of the most important things you can do for your health.  Most adults should exercise for at least 150 minutes each week. The exercise should increase your heart rate and make you sweat (moderate-intensity exercise).  Most adults should also do  strengthening exercises at least twice a week. This is in addition to the moderate-intensity exercise.  Maintain a healthy weight  Body mass index (BMI) is a measurement that can be used to identify possible weight problems. It estimates body fat based on height and weight. Your health care provider can help determine your BMI and help you achieve or maintain a healthy weight.  For females 50 years of age and older:   A BMI below 18.5 is considered underweight.  A BMI of 18.5 to 24.9 is normal.  A BMI of 25 to 29.9 is considered overweight.  A BMI of 30 and above is considered obese.  Watch levels of cholesterol and blood lipids  You should start having your blood tested for lipids and cholesterol at 75 years of age, then have this test every 5 years.  You may need to have your cholesterol levels checked more often if:  Your lipid or cholesterol levels are high.  You are older than 75 years of age.  You are at high risk for heart disease.  CANCER SCREENING   Lung Cancer  Lung cancer screening is recommended for adults 27-61 years old who are at high risk for lung cancer because of a history of smoking.  A yearly low-dose CT scan of the lungs is recommended for people who:  Currently smoke.  Have quit within the past 15 years.  Have at least a 30-pack-year history of smoking. A pack year is smoking an average of one pack of cigarettes a day for 1 year.  Yearly screening should continue until it has been 15 years since you quit.  Yearly screening should stop if you develop a health problem that would prevent you from having lung cancer treatment.  Breast Cancer  Practice breast self-awareness. This means understanding how your breasts normally appear and feel.  It also means doing regular breast self-exams. Let your health care provider know about any changes, no matter how small.  If you are in your 20s or 30s, you should have a clinical breast exam (CBE) by a  health care provider every 1-3 years as part of a regular health exam.  If you are 72 or older, have a CBE every year. Also consider having a breast X-ray (mammogram) every year.  If you have a family history of breast cancer, talk to your health care provider about genetic screening.  If you are at high risk for breast cancer, talk to your health care provider about having an MRI and a mammogram every year.  Breast cancer gene (BRCA) assessment is recommended for women who have family members with BRCA-related cancers. BRCA-related cancers include:  Breast.  Ovarian.  Tubal.  Peritoneal cancers.  Results of the assessment will determine the need for genetic counseling and BRCA1 and BRCA2 testing. Cervical Cancer Your health care provider may recommend that you be screened regularly for cancer of the pelvic organs (ovaries, uterus, and vagina). This screening involves a pelvic examination, including checking for microscopic changes to the surface of your cervix (Pap test). You may be encouraged to have this screening done every 3 years, beginning at age 96.  For women ages 25-65, health care providers may recommend pelvic exams and Pap testing every 3 years, or they may recommend the Pap and pelvic exam, combined with testing for human papilloma virus (HPV), every 5 years. Some types of HPV increase your risk of cervical cancer. Testing for HPV may also be done on women of any age with unclear Pap test results.  Other health care providers may not recommend any screening for nonpregnant women who are considered low risk for pelvic cancer and who do not have symptoms. Ask your health care provider if a screening pelvic exam is right for you.  If you have had past treatment for cervical cancer or a condition that could lead to cancer, you need Pap tests and screening for cancer for at least 20 years after your treatment. If Pap tests have been discontinued, your risk factors (such as having a  new sexual partner) need to be reassessed to determine if screening should resume. Some women have medical problems that increase the chance of getting cervical cancer. In these cases, your health care provider may recommend more frequent screening and Pap tests. Colorectal Cancer  This type of cancer can be detected and often prevented.  Routine colorectal cancer screening usually begins at 75 years of age and continues through 75 years of age.  Your health care provider may recommend screening at an earlier age if you have risk factors for colon cancer.  Your health care  provider may also recommend using home test kits to check for hidden blood in the stool.  A small camera at the end of a tube can be used to examine your colon directly (sigmoidoscopy or colonoscopy). This is done to check for the earliest forms of colorectal cancer.  Routine screening usually begins at age 61.  Direct examination of the colon should be repeated every 5-10 years through 75 years of age. However, you may need to be screened more often if early forms of precancerous polyps or small growths are found. Skin Cancer  Check your skin from head to toe regularly.  Tell your health care provider about any new moles or changes in moles, especially if there is a change in a mole's shape or color.  Also tell your health care provider if you have a mole that is larger than the size of a pencil eraser.  Always use sunscreen. Apply sunscreen liberally and repeatedly throughout the day.  Protect yourself by wearing long sleeves, pants, a wide-brimmed hat, and sunglasses whenever you are outside. HEART DISEASE, DIABETES, AND HIGH BLOOD PRESSURE   High blood pressure causes heart disease and increases the risk of stroke. High blood pressure is more likely to develop in:  People who have blood pressure in the high end of the normal range (130-139/85-89 mm Hg).  People who are overweight or obese.  People who are  African American.  If you are 69-24 years of age, have your blood pressure checked every 3-5 years. If you are 10 years of age or older, have your blood pressure checked every year. You should have your blood pressure measured twice--once when you are at a hospital or clinic, and once when you are not at a hospital or clinic. Record the average of the two measurements. To check your blood pressure when you are not at a hospital or clinic, you can use:  An automated blood pressure machine at a pharmacy.  A home blood pressure monitor.  If you are between 60 years and 74 years old, ask your health care provider if you should take aspirin to prevent strokes.  Have regular diabetes screenings. This involves taking a blood sample to check your fasting blood sugar level.  If you are at a normal weight and have a low risk for diabetes, have this test once every three years after 75 years of age.  If you are overweight and have a high risk for diabetes, consider being tested at a younger age or more often. PREVENTING INFECTION  Hepatitis B  If you have a higher risk for hepatitis B, you should be screened for this virus. You are considered at high risk for hepatitis B if:  You were born in a country where hepatitis B is common. Ask your health care provider which countries are considered high risk.  Your parents were born in a high-risk country, and you have not been immunized against hepatitis B (hepatitis B vaccine).  You have HIV or AIDS.  You use needles to inject street drugs.  You live with someone who has hepatitis B.  You have had sex with someone who has hepatitis B.  You get hemodialysis treatment.  You take certain medicines for conditions, including cancer, organ transplantation, and autoimmune conditions. Hepatitis C  Blood testing is recommended for:  Everyone born from [birth year] through 1966.  Anyone with known risk factors for hepatitis C. Sexually transmitted infections  (STIs)  You should be screened for sexually transmitted infections (STIs)  including gonorrhea and chlamydia if:  You are sexually active and are younger than 75 years of age.  You are older than 75 years of age and your health care provider tells you that you are at risk for this type of infection.  Your sexual activity has changed since you were last screened and you are at an increased risk for chlamydia or gonorrhea. Ask your health care provider if you are at risk.  If you do not have HIV, but are at risk, it may be recommended that you take a prescription medicine daily to prevent HIV infection. This is called pre-exposure prophylaxis (PrEP). You are considered at risk if:  You are sexually active and do not regularly use condoms or know the HIV status of your partner(s).  You take drugs by injection.  You are sexually active with a partner who has HIV. Talk with your health care provider about whether you are at high risk of being infected with HIV. If you choose to begin PrEP, you should first be tested for HIV. You should then be tested every 3 months for as long as you are taking PrEP.  PREGNANCY   If you are premenopausal and you may become pregnant, ask your health care provider about preconception counseling.  If you may become pregnant, take 400 to 800 micrograms (mcg) of folic acid every day.  If you want to prevent pregnancy, talk to your health care provider about birth control (contraception). OSTEOPOROSIS AND MENOPAUSE   Osteoporosis is a disease in which the bones lose minerals and strength with aging. This can result in serious bone fractures. Your risk for osteoporosis can be identified using a bone density scan.  If you are 77 years of age or older, or if you are at risk for osteoporosis and fractures, ask your health care provider if you should be screened.  Ask your health care provider whether you should take a calcium or vitamin D supplement to lower your risk  for osteoporosis.  Menopause may have certain physical symptoms and risks.  Hormone replacement therapy may reduce some of these symptoms and risks. Talk to your health care provider about whether hormone replacement therapy is right for you.  HOME CARE INSTRUCTIONS   Schedule regular health, dental, and eye exams.  Stay current with your immunizations.   Do not use any tobacco products including cigarettes, chewing tobacco, or electronic cigarettes.  If you are pregnant, do not drink alcohol.  If you are breastfeeding, limit how much and how often you drink alcohol.  Limit alcohol intake to no more than 1 drink per day for nonpregnant women. One drink equals 12 ounces of beer, 5 ounces of wine, or 1 ounces of hard liquor.  Do not use street drugs.  Do not share needles.  Ask your health care provider for help if you need support or information about quitting drugs.  Tell your health care provider if you often feel depressed.  Tell your health care provider if you have ever been abused or do not feel safe at home.   This information is not intended to replace advice given to you by your health care provider. Make sure you discuss any questions you have with your health care provider.   Document Released: 11/22/2010 Document Revised: 05/30/2014 Document Reviewed: 04/10/2013 Elsevier Interactive Patient Education 2016 [place] K. [name] M.D.

## 2015-04-06 NOTE — Patient Instructions (Addendum)
Try using zantac  Twice a day  May be long term safer    thatn the prilosec if helps.  Mammogram after next year   February 2016 was the last one  Consider  bone density  In next year   To assess risk .  Continue fall   Prevention  Intervnetions.  Your blood pressure is good today. Check  BMP  In another month  And if still ok potassium then see you in 6 months .      Bone Health Bones protect organs, store calcium, and anchor muscles. Good health habits, such as eating nutritious foods and exercising regularly, are important for maintaining healthy bones. They can also help to prevent a condition that causes bones to lose density and become weak and brittle (osteoporosis). WHY IS BONE MASS IMPORTANT? Bone mass refers to the amount of bone tissue that you have. The higher your bone mass, the stronger your bones. An important step toward having healthy bones throughout life is to have strong and dense bones during childhood. A young adult who has a high bone mass is more likely to have a high bone mass later in life. Bone mass at its greatest it is called peak bone mass. A large decline in bone mass occurs in older adults. In women, it occurs about the time of menopause. During this time, it is important to practice good health habits, because if more bone is lost than what is replaced, the bones will become less healthy and more likely to break (fracture). If you find that you have a low bone mass, you may be able to prevent osteoporosis or further bone loss by changing your diet and lifestyle. HOW CAN I FIND OUT IF MY BONE MASS IS LOW? Bone mass can be measured with an X-ray test that is called a bone mineral density (BMD) test. This test is recommended for all women who are age 603 or older. It may also be recommended for men who are age 399 or older, or for people who are more likely to develop osteoporosis due to:  Having bones that break easily.  Having a long-term disease that weakens bones,  such as kidney disease or rheumatoid arthritis.  Having menopause earlier than normal.  Taking medicine that weakens bones, such as steroids, thyroid hormones, or hormone treatment for breast cancer or prostate cancer.  Smoking.  Drinking three or more alcoholic drinks each day. WHAT ARE THE NUTRITIONAL RECOMMENDATIONS FOR HEALTHY BONES? To have healthy bones, you need to get enough of the right minerals and vitamins. Most nutrition experts recommend getting these nutrients from the foods that you eat. Nutritional recommendations vary from person to person. Ask your health care provider what is healthy for you. Here are some general guidelines. Calcium Recommendations Calcium is the most important (essential) mineral for bone health. Most people can get enough calcium from their diet, but supplements may be recommended for people who are at risk for osteoporosis. Good sources of calcium include:  Dairy products, such as low-fat or nonfat milk, cheese, and yogurt.  Dark green leafy vegetables, such as bok choy and broccoli.  Calcium-fortified foods, such as orange juice, cereal, bread, soy beverages, and tofu products.  Nuts, such as almonds. Follow these recommended amounts for daily calcium intake:  Children, age 39-3: 700 mg.  Children, age 60-8: 1,000 mg.  Children, age 608-13: 1,300 mg.  Teens, age 394-18: 1,300 mg.  Adults, age 38-50: 1,000 mg.  Adults, age 24-70:  Men:  1,000 mg.  Women: 1,200 mg.  Adults, age 79 or older: 1,200 mg.  Pregnant and breastfeeding females:  Teens: 1,300 mg.  Adults: 1,000 mg. Vitamin D Recommendations Vitamin D is the most essential vitamin for bone health. It helps the body to absorb calcium. Sunlight stimulates the skin to make vitamin D, so be sure to get enough sunlight. If you live in a cold climate or you do not get outside often, your health care provider may recommend that you take vitamin D supplements. Good sources of vitamin D in  your diet include:  Egg yolks.  Saltwater fish.  Milk and cereal fortified with vitamin D. Follow these recommended amounts for daily vitamin D intake:  Children and teens, age 11-18: 600 international units.  Adults, age 85 or younger: 400-800 international units.  Adults, age 60 or older: 800-1,000 international units. Other Nutrients Other nutrients for bone health include:  Phosphorus. This mineral is found in meat, poultry, dairy foods, nuts, and legumes. The recommended daily intake for adult men and adult women is 700 mg.  Magnesium. This mineral is found in seeds, nuts, dark green vegetables, and legumes. The recommended daily intake for adult men is 400-420 mg. For adult women, it is 310-320 mg.  Vitamin K. This vitamin is found in green leafy vegetables. The recommended daily intake is 120 mg for adult men and 90 mg for adult women. WHAT TYPE OF PHYSICAL ACTIVITY IS BEST FOR BUILDING AND MAINTAINING HEALTHY BONES? Weight-bearing and strength-building activities are important for building and maintaining peak bone mass. Weight-bearing activities cause muscles and bones to work against gravity. Strength-building activities increases muscle strength that supports bones. Weight-bearing and muscle-building activities include:  Walking and hiking.  Jogging and running.  Dancing.  Gym exercises.  Lifting weights.  Tennis and racquetball.  Climbing stairs.  Aerobics. Adults should get at least 30 minutes of moderate physical activity on most days. Children should get at least 60 minutes of moderate physical activity on most days. Ask your health care provide what type of exercise is best for you. WHERE CAN I FIND MORE INFORMATION? For more information, check out the following websites:  [place]: YardHomes.se  [employer] of Health: http://www.niams.AnonymousEar.fr.asp   This  information is not intended to replace advice given to you by your health care provider. Make sure you discuss any questions you have with your health care provider.   Document Released: 07/30/2003 Document Revised: 09/23/2014 Document Reviewed: 05/14/2014 Elsevier Interactive Patient Education 2016 [place] Maintenance, Female Adopting a healthy lifestyle and getting preventive care can go a long way to promote health and wellness. Talk with your health care provider about what schedule of regular examinations is right for you. This is a good chance for you to check in with your provider about disease prevention and staying healthy. In between checkups, there are plenty of things you can do on your own. Experts have done a lot of research about which lifestyle changes and preventive measures are most likely to keep you healthy. Ask your health care provider for more information. WEIGHT AND DIET  Eat a healthy diet  Be sure to include plenty of vegetables, fruits, low-fat dairy products, and lean protein.  Do not eat a lot of foods high in solid fats, added sugars, or salt.  Get regular exercise. This is one of the most important things you can do for your health.  Most adults should exercise for at least 150 minutes each  week. The exercise should increase your heart rate and make you sweat (moderate-intensity exercise).  Most adults should also do strengthening exercises at least twice a week. This is in addition to the moderate-intensity exercise.  Maintain a healthy weight  Body mass index (BMI) is a measurement that can be used to identify possible weight problems. It estimates body fat based on height and weight. Your health care provider can help determine your BMI and help you achieve or maintain a healthy weight.  For females 36 years of age and older:   A BMI below 18.5 is considered underweight.  A BMI of 18.5 to 24.9 is normal.  A BMI of 25 to 29.9 is  considered overweight.  A BMI of 30 and above is considered obese.  Watch levels of cholesterol and blood lipids  You should start having your blood tested for lipids and cholesterol at 75 years of age, then have this test every 5 years.  You may need to have your cholesterol levels checked more often if:  Your lipid or cholesterol levels are high.  You are older than 75 years of age.  You are at high risk for heart disease.  CANCER SCREENING   Lung Cancer  Lung cancer screening is recommended for adults 18-67 years old who are at high risk for lung cancer because of a history of smoking.  A yearly low-dose CT scan of the lungs is recommended for people who:  Currently smoke.  Have quit within the past 15 years.  Have at least a 30-pack-year history of smoking. A pack year is smoking an average of one pack of cigarettes a day for 1 year.  Yearly screening should continue until it has been 15 years since you quit.  Yearly screening should stop if you develop a health problem that would prevent you from having lung cancer treatment.  Breast Cancer  Practice breast self-awareness. This means understanding how your breasts normally appear and feel.  It also means doing regular breast self-exams. Let your health care provider know about any changes, no matter how small.  If you are in your 20s or 30s, you should have a clinical breast exam (CBE) by a health care provider every 1-3 years as part of a regular health exam.  If you are 41 or older, have a CBE every year. Also consider having a breast X-ray (mammogram) every year.  If you have a family history of breast cancer, talk to your health care provider about genetic screening.  If you are at high risk for breast cancer, talk to your health care provider about having an MRI and a mammogram every year.  Breast cancer gene (BRCA) assessment is recommended for women who have family members with BRCA-related cancers.  BRCA-related cancers include:  Breast.  Ovarian.  Tubal.  Peritoneal cancers.  Results of the assessment will determine the need for genetic counseling and BRCA1 and BRCA2 testing. Cervical Cancer Your health care provider may recommend that you be screened regularly for cancer of the pelvic organs (ovaries, uterus, and vagina). This screening involves a pelvic examination, including checking for microscopic changes to the surface of your cervix (Pap test). You may be encouraged to have this screening done every 3 years, beginning at age 50.  For women ages 47-65, health care providers may recommend pelvic exams and Pap testing every 3 years, or they may recommend the Pap and pelvic exam, combined with testing for human papilloma virus (HPV), every 5 years. Some types  of HPV increase your risk of cervical cancer. Testing for HPV may also be done on women of any age with unclear Pap test results.  Other health care providers may not recommend any screening for nonpregnant women who are considered low risk for pelvic cancer and who do not have symptoms. Ask your health care provider if a screening pelvic exam is right for you.  If you have had past treatment for cervical cancer or a condition that could lead to cancer, you need Pap tests and screening for cancer for at least 20 years after your treatment. If Pap tests have been discontinued, your risk factors (such as having a new sexual partner) need to be reassessed to determine if screening should resume. Some women have medical problems that increase the chance of getting cervical cancer. In these cases, your health care provider may recommend more frequent screening and Pap tests. Colorectal Cancer  This type of cancer can be detected and often prevented.  Routine colorectal cancer screening usually begins at 75 years of age and continues through 75 years of age.  Your health care provider may recommend screening at an earlier age if you  have risk factors for colon cancer.  Your health care provider may also recommend using home test kits to check for hidden blood in the stool.  A small camera at the end of a tube can be used to examine your colon directly (sigmoidoscopy or colonoscopy). This is done to check for the earliest forms of colorectal cancer.  Routine screening usually begins at age 11.  Direct examination of the colon should be repeated every 5-10 years through 75 years of age. However, you may need to be screened more often if early forms of precancerous polyps or small growths are found. Skin Cancer  Check your skin from head to toe regularly.  Tell your health care provider about any new moles or changes in moles, especially if there is a change in a mole's shape or color.  Also tell your health care provider if you have a mole that is larger than the size of a pencil eraser.  Always use sunscreen. Apply sunscreen liberally and repeatedly throughout the day.  Protect yourself by wearing long sleeves, pants, a wide-brimmed hat, and sunglasses whenever you are outside. HEART DISEASE, DIABETES, AND HIGH BLOOD PRESSURE   High blood pressure causes heart disease and increases the risk of stroke. High blood pressure is more likely to develop in:  People who have blood pressure in the high end of the normal range (130-139/85-89 mm Hg).  People who are overweight or obese.  People who are African American.  If you are 47-38 years of age, have your blood pressure checked every 3-5 years. If you are 19 years of age or older, have your blood pressure checked every year. You should have your blood pressure measured twice--once when you are at a hospital or clinic, and once when you are not at a hospital or clinic. Record the average of the two measurements. To check your blood pressure when you are not at a hospital or clinic, you can use:  An automated blood pressure machine at a pharmacy.  A home blood pressure  monitor.  If you are between 61 years and 59 years old, ask your health care provider if you should take aspirin to prevent strokes.  Have regular diabetes screenings. This involves taking a blood sample to check your fasting blood sugar level.  If you are at a  normal weight and have a low risk for diabetes, have this test once every three years after 75 years of age.  If you are overweight and have a high risk for diabetes, consider being tested at a younger age or more often. PREVENTING INFECTION  Hepatitis B  If you have a higher risk for hepatitis B, you should be screened for this virus. You are considered at high risk for hepatitis B if:  You were born in a country where hepatitis B is common. Ask your health care provider which countries are considered high risk.  Your parents were born in a high-risk country, and you have not been immunized against hepatitis B (hepatitis B vaccine).  You have HIV or AIDS.  You use needles to inject street drugs.  You live with someone who has hepatitis B.  You have had sex with someone who has hepatitis B.  You get hemodialysis treatment.  You take certain medicines for conditions, including cancer, organ transplantation, and autoimmune conditions. Hepatitis C  Blood testing is recommended for:  Everyone born from [birth year] through 1966.  Anyone with known risk factors for hepatitis C. Sexually transmitted infections (STIs)  You should be screened for sexually transmitted infections (STIs) including gonorrhea and chlamydia if:  You are sexually active and are younger than 75 years of age.  You are older than 74 years of age and your health care provider tells you that you are at risk for this type of infection.  Your sexual activity has changed since you were last screened and you are at an increased risk for chlamydia or gonorrhea. Ask your health care provider if you are at risk.  If you do not have HIV, but are at risk, it may be  recommended that you take a prescription medicine daily to prevent HIV infection. This is called pre-exposure prophylaxis (PrEP). You are considered at risk if:  You are sexually active and do not regularly use condoms or know the HIV status of your partner(s).  You take drugs by injection.  You are sexually active with a partner who has HIV. Talk with your health care provider about whether you are at high risk of being infected with HIV. If you choose to begin PrEP, you should first be tested for HIV. You should then be tested every 3 months for as long as you are taking PrEP.  PREGNANCY   If you are premenopausal and you may become pregnant, ask your health care provider about preconception counseling.  If you may become pregnant, take 400 to 800 micrograms (mcg) of folic acid every day.  If you want to prevent pregnancy, talk to your health care provider about birth control (contraception). OSTEOPOROSIS AND MENOPAUSE   Osteoporosis is a disease in which the bones lose minerals and strength with aging. This can result in serious bone fractures. Your risk for osteoporosis can be identified using a bone density scan.  If you are 63 years of age or older, or if you are at risk for osteoporosis and fractures, ask your health care provider if you should be screened.  Ask your health care provider whether you should take a calcium or vitamin D supplement to lower your risk for osteoporosis.  Menopause may have certain physical symptoms and risks.  Hormone replacement therapy may reduce some of these symptoms and risks. Talk to your health care provider about whether hormone replacement therapy is right for you.  HOME CARE INSTRUCTIONS   Schedule regular health, dental, and  eye exams.  Stay current with your immunizations.   Do not use any tobacco products including cigarettes, chewing tobacco, or electronic cigarettes.  If you are pregnant, do not drink alcohol.  If you are  breastfeeding, limit how much and how often you drink alcohol.  Limit alcohol intake to no more than 1 drink per day for nonpregnant women. One drink equals 12 ounces of beer, 5 ounces of wine, or 1 ounces of hard liquor.  Do not use street drugs.  Do not share needles.  Ask your health care provider for help if you need support or information about quitting drugs.  Tell your health care provider if you often feel depressed.  Tell your health care provider if you have ever been abused or do not feel safe at home.   This information is not intended to replace advice given to you by your health care provider. Make sure you discuss any questions you have with your health care provider.   Document Released: 11/22/2010 Document Revised: 05/30/2014 Document Reviewed: 04/10/2013 Elsevier Interactive Patient Education [employer].

## 2015-04-18 ENCOUNTER — Encounter (HOSPITAL_COMMUNITY): Payer: Self-pay | Admitting: *Deleted

## 2015-04-18 ENCOUNTER — Other Ambulatory Visit (HOSPITAL_COMMUNITY): Admission: AD | Admit: 2015-04-18 | Payer: Self-pay | Source: Ambulatory Visit | Admitting: Family Medicine

## 2015-04-18 ENCOUNTER — Emergency Department (HOSPITAL_COMMUNITY)
Admission: EM | Admit: 2015-04-18 | Discharge: 2015-04-18 | Disposition: A | Discharge status: Home / Self Care | Payer: Medicare Other | Source: Home / Self Care

## 2015-04-18 ENCOUNTER — Other Ambulatory Visit (HOSPITAL_COMMUNITY)
Admission: RE | Admit: 2015-04-18 | Discharge: 2015-04-18 | Disposition: A | Discharge status: Home / Self Care | Payer: Medicare Other | Source: Ambulatory Visit | Attending: Family Medicine | Admitting: Family Medicine

## 2015-04-18 DIAGNOSIS — N3090 Cystitis, unspecified without hematuria: Secondary | ICD-10-CM

## 2015-04-18 LAB — POCT URINALYSIS DIP (DEVICE)
Bilirubin Urine: NEGATIVE
Glucose, UA: NEGATIVE mg/dL
Hgb urine dipstick: NEGATIVE
Ketones, ur: NEGATIVE mg/dL
Nitrite: NEGATIVE
Protein, ur: NEGATIVE mg/dL
Specific Gravity, Urine: 1.02 (ref 1.005–1.030)
Urobilinogen, UA: 0.2 mg/dL (ref 0.0–1.0)
pH: 5.5 (ref 5.0–8.0)

## 2015-04-18 MED ORDER — AMOXICILLIN-POT CLAVULANATE 875-125 MG PO TABS: 1.0000 | ORAL_TABLET | Freq: Two times a day (BID) | ORAL | Status: DC

## 2015-04-18 NOTE — Discharge Instructions (Signed)

## 2015-04-18 NOTE — ED Notes (Signed)
Pt   Reports    Symptoms  Of  Urinary  Frequency  Urgency   And   Discomfort   X  2  Days    Pt  Reports  She had a  Kidney infection  About 1  Month  Ago

## 2015-04-19 ENCOUNTER — Encounter: Payer: Self-pay | Admitting: Internal Medicine

## 2015-04-20 LAB — URINE CULTURE
Culture: 80000
Special Requests: NORMAL

## 2015-04-20 NOTE — ED Notes (Signed)
Final report of urine C&S positive for UTI, treatment adequate w Rx provided day of [facility] visit

## 2015-04-23 ENCOUNTER — Other Ambulatory Visit: Payer: Self-pay | Admitting: Internal Medicine

## 2015-04-23 NOTE — ED Provider Notes (Signed)
CSN: [id number]     Arrival date & time 04/18/15  1735 History   None    Chief Complaint  Patient presents with  . Urinary Tract Infection   (Consider location/radiation/quality/duration/timing/severity/associated sxs/prior Treatment) HPI History obtained from patient:   LOCATION:bladder SEVERITY:2 DURATION:2 days CONTEXT: sudden onset of frequency QUALITY:similar to previous UTI about 1 month ago MODIFYING FACTORS:OTC without relief ASSOCIATED SYMPTOMS: urgency TIMING:constant    Past Medical History  Diagnosis Date  . GERD (gastroesophageal reflux disease)   . Hyperlipidemia   . Hypertension     echo nl lv function  mild dilitation 2009  . Migraine     hypnic HA eval by Dr. Earley Favor in the past  . Abnormal blood finding     elevated Hg and hct    . 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 Hearing aid worn   . Polycythemia   . Trigeminal neuralgia pain   . Closed head injury 02/01/2011    from syncope and had scalp laceration  neg ct .    Marland Kitchen Syncope 02/01/2011    In shower on vacation  sustained head laceration  8 sutures Had ed visit neg head ct labs and x ray   . 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 Medication side effect 09/02/2010    Poss muscle se of 10 crestor   . Gall stones 2016    see ct scan neg HIDA    Past Surgical History  Procedure Laterality Date  . Doppler echocardiography  2009    nl lv function mild lv dilitation  . Abdominal hysterectomy  2002    tubal  . Cardiac catheterization  2000    chest pains neg  . Rt shoulder surgery    . Oophorectomy  2002  . Appendectomy  2002  . Craniotomy  July 19 13    nerve decompression right trigeminal   . Colonoscopy      multiple   Family History  Problem Relation Age of Onset  . Ovarian cancer Mother   . Stroke Mother   . Alcohol abuse Father   . Stroke Father   . Seizures Daughter   . Hypertension    . Diabetes Brother   .  Cancer Paternal Aunt     leukemia, unknown type  . Colon cancer Neg Hx    Social History  Substance Use Topics  . Smoking status: Never Smoker   . Smokeless tobacco: Never Used  . Alcohol Use: 1.2 oz/week    0 Standard drinks or equivalent, 2 Glasses of wine per week   OB History    Gravida Para Term Preterm AB TAB SAB Ectopic Multiple Living   2 2             Review of Systems ROS +'ve frequency of urination  Denies: HEADACHE, NAUSEA, ABDOMINAL PAIN, CHEST PAIN, CONGESTION, DYSURIA, SHORTNESS OF BREATH  Allergies  Sulfamethoxazole-trimethoprim  Home Medications   Prior to Admission medications   Medication Sig Start Date End Date Taking? Authorizing Provider  albuterol (PROVENTIL HFA;VENTOLIN HFA) 108 (90 BASE) MCG/ACT inhaler Inhale 2 puffs into the lungs every 6 (six) hours as needed. 03/23/15   [name], MD  amLODipine (NORVASC) 10 MG tablet TAKE ONE TABLET BY MOUTH ONCE DAILY 12/08/14   [name], MD  amoxicillin-clavulanate (AUGMENTIN) 875-125 MG tablet Take 1 tablet by mouth every 12 (  twelve) hours. 04/18/15   Konrad Felix, PA  benazepril (LOTENSIN) 20 MG tablet TAKE ONE TABLET BY MOUTH ONCE DAILY 10/03/14   [name], MD  butalbital-aspirin-caffeine-codeine [facility] WITH CODEINE) [phone] MG capsule Take 1 capsule by mouth every 4 (four) hours as needed for migraine. 1-2 by mouth every 4-6 hours as needed max of 6 tabs in 24 hours 05/27/14   [name], MD  chlorthalidone (HYGROTON) 25 MG tablet TAKE ONE TABLET BY MOUTH ONCE DAILY 11/10/14   [name], MD  EPITOL 200 MG tablet TAKE TWO TABLETS BY MOUTH TWICE DAILY 11/10/14   [name], MD  metoprolol (LOPRESSOR) 50 MG tablet TAKE ONE-HALF TABLET BY MOUTH TWICE DAILY 10/16/14   [name], MD   Meds Ordered and Administered this Visit  Medications - No data to display  BP 171/94 mmHg  Pulse 61  Temp(Src) 97.2 F (36.2 C) (Oral)  Resp 18  SpO2 97% No data found.   Physical Exam   Constitutional: She is oriented to person, place, and time. She appears well-developed and well-nourished.  Pulmonary/Chest: Effort normal and breath sounds normal.  Abdominal: Soft.  No CVA-T  Musculoskeletal: Normal range of motion.  Neurological: She is oriented to person, place, and time.  Skin: Skin is warm and dry.  Psychiatric: She has a normal mood and affect. Her behavior is normal. Judgment and thought content normal.  Nursing note and vitals reviewed.   ED Course  Procedures (including critical care time)  Labs Review Labs Reviewed  POCT URINALYSIS DIP (DEVICE) - Abnormal; Notable for the following:    Leukocytes, UA MODERATE (*)    All other components within normal limits  URINE CULTURE    Imaging Review No results found.   Visual Acuity Review  Right Eye Distance:   Left Eye Distance:   Bilateral Distance:    Right Eye Near:   Left Eye Near:    Bilateral Near:         MDM   1. Cystitis    Pt's daughter is an EMD in [place] and is in agreement with this plan and treatment.  Rx for Augmentin is provided. Discussed use of levaquin in older patients and daughter agreed this med would be a risk for achilles injury in this patient.     Konrad Felix, [place] 04/23/15 [phone]

## 2015-04-27 NOTE — ED Notes (Signed)
Feels better . Had questions about Rx. Advised to be rechecked if any problems

## 2015-05-06 ENCOUNTER — Other Ambulatory Visit (INDEPENDENT_AMBULATORY_CARE_PROVIDER_SITE_OTHER): Payer: Medicare Other

## 2015-05-06 ENCOUNTER — Other Ambulatory Visit: Payer: Self-pay | Admitting: Internal Medicine

## 2015-05-06 DIAGNOSIS — E876 Hypokalemia: Secondary | ICD-10-CM

## 2015-05-06 DIAGNOSIS — I10 Essential (primary) hypertension: Secondary | ICD-10-CM | POA: Diagnosis not present

## 2015-05-06 LAB — BASIC METABOLIC PANEL
BUN: 16 mg/dL (ref 6–23)
CO2: 33 mEq/L — ABNORMAL HIGH (ref 19–32)
Calcium: 9.2 mg/dL (ref 8.4–10.5)
Chloride: 96 mEq/L (ref 96–112)
Creatinine, Ser: 0.87 mg/dL (ref 0.40–1.20)
GFR: 67.32 mL/min (ref >60.00)
Glucose, Bld: 98 mg/dL (ref 70–99)
Potassium: 3.8 mEq/L (ref 3.5–5.1)
Sodium: 134 mEq/L — ABNORMAL LOW (ref 135–145)

## 2015-05-06 NOTE — Telephone Encounter (Signed)
Sent to the pharmacy by e-scribe.  Pt has upcoming appt in May 2017

## 2015-05-11 ENCOUNTER — Telehealth: Payer: Self-pay | Admitting: Internal Medicine

## 2015-05-11 NOTE — Telephone Encounter (Signed)
Her potassium is now normal. Stay on current meds

## 2015-05-11 NOTE — Telephone Encounter (Signed)
Pt would like blood work reults

## 2015-05-11 NOTE — Telephone Encounter (Signed)
Tried to return the patient's call.  Home machine picked up and then disconnected when fax machine came on.  Will try again at a later time.

## 2015-05-12 NOTE — Telephone Encounter (Signed)
Pt notified to continue doing as she has been.

## 2015-05-22 ENCOUNTER — Telehealth: Payer: Self-pay | Admitting: Internal Medicine

## 2015-05-22 ENCOUNTER — Encounter: Payer: Self-pay | Admitting: Internal Medicine

## 2015-05-22 ENCOUNTER — Ambulatory Visit (INDEPENDENT_AMBULATORY_CARE_PROVIDER_SITE_OTHER): Payer: Medicare Other | Admitting: Internal Medicine

## 2015-05-22 VITALS — BP 126/80 | Temp 97.8°F | Wt 135.0 lb

## 2015-05-22 DIAGNOSIS — N390 Urinary tract infection, site not specified: Secondary | ICD-10-CM

## 2015-05-22 DIAGNOSIS — R350 Frequency of micturition: Secondary | ICD-10-CM

## 2015-05-22 DIAGNOSIS — R300 Dysuria: Secondary | ICD-10-CM

## 2015-05-22 LAB — POCT URINALYSIS DIPSTICK
Bilirubin, UA: NEGATIVE
Blood, UA: NEGATIVE
Glucose, UA: NEGATIVE
Ketones, UA: NEGATIVE
Nitrite, UA: POSITIVE
Protein, UA: NEGATIVE
Spec Grav, UA: 1.015
Urobilinogen, UA: 1
pH, UA: 7

## 2015-05-22 MED ORDER — CEFUROXIME AXETIL 500 MG PO TABS: 500.0000 mg | ORAL_TABLET | Freq: Two times a day (BID) | ORAL | Status: DC

## 2015-05-22 NOTE — Telephone Encounter (Signed)
Per WP, pt should come in for appt.  Scheduled today at 2:30 PM.

## 2015-05-22 NOTE — Telephone Encounter (Signed)
Ms. Culliton called saying the same symptoms she went to the hospital for 6 weeks ago (kidney infection) have returned. She's experiencing discomfort in her pubic area, discomfort when she urinates, and frequency. She thinks she has a UTI and would like to have a urinalysis and get a refill of the Rx she had before. Please give her a phone call regarding this.  Pt's ph# [phone] Thank you.

## 2015-05-22 NOTE — Telephone Encounter (Signed)
Spoke to the pt.  Denied Fever, back pain and side pain.  Does have some nausea, diarrhea, fatigue,  abdominal discomfort, dysuria and frequency.  Completed last antibiotics.  Stated the sx did completely go away and now back. Please advise.  Thanks!

## 2015-05-22 NOTE — Patient Instructions (Signed)
You have uti   Take antibiotic .     As tested If any sx at end of medicine .     Contact us  Will let you know  What culture shows.

## 2015-05-22 NOTE — Progress Notes (Signed)
Pre visit review using our clinic review tool, if applicable. No additional management support is needed unless otherwise documented below in the visit note.   Chief Complaint  Patient presents with  . Dysuria  . Urinary Frequency    HPI: [name] 74 y.o.  Comes in for sda  Onset for about a week and has dysuria    adn urgency  And very little  uirne  And buning  No fever flanl pain has hx of uti and pyelo and uro sepsis hosp in last year .  No sx noted in begtween  Recent  travel to [place]  no other illness some nausea  No meds  ROS: See pertinent positives and negatives per HPI.  Past Medical History  Diagnosis Date  . GERD (gastroesophageal reflux disease)   . Hyperlipidemia   . Hypertension     echo nl lv function  mild dilitation 2009  . Migraine     hypnic HA eval by Dr. Earley Favor in the past  . Abnormal blood finding     elevated Hg and hct    . 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 Hearing aid worn   . Polycythemia   . Trigeminal neuralgia pain   . Closed head injury 02/01/2011    from syncope and had scalp laceration  neg ct .    Marland Kitchen Syncope 02/01/2011    In shower on vacation  sustained head laceration  8 sutures Had ed visit neg head ct labs and x ray   . 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 Medication side effect 09/02/2010    Poss muscle se of 10 crestor   . Gall stones 2016    see ct scan neg HIDA     Family History  Problem Relation Age of Onset  . Ovarian cancer Mother   . Stroke Mother   . Alcohol abuse Father   . Stroke Father   . Seizures Daughter   . Hypertension    . Diabetes Brother   . Cancer Paternal Aunt     leukemia, unknown type  . Colon cancer Neg Hx     Social History   Social History  . Marital Status: Married    Spouse Name: N/A  . Number of Children: 2  . Years of Education: N/A   Occupational History  .      retired Forensic psychologist   Social  History Main Topics  . Smoking status: Never Smoker   . Smokeless tobacco: Never Used  . Alcohol Use: 1.2 oz/week    0 Standard drinks or equivalent, 2 Glasses of wine per week  . Drug Use: No  . Sexual Activity: Not Asked   Other Topics Concern  . 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 albuterol (PROVENTIL HFA;VENTOLIN HFA) 108 (90 BASE) MCG/ACT inhaler Inhale 2 puffs into the lungs every 6 (six) hours as needed. 1 Inhaler 2  . amLODipine (NORVASC) 10 MG tablet TAKE ONE TABLET BY MOUTH ONCE DAILY 90 tablet 1  . benazepril (LOTENSIN) 20 MG  tablet TAKE ONE TABLET BY MOUTH ONCE DAILY 90 tablet 1  . butalbital-aspirin-caffeine-codeine (FIORINAL WITH CODEINE) 50-325-40-30 MG capsule Take 1 capsule by mouth every 4 (four) hours as needed for migraine. 1-2 by mouth every 4-6 hours as needed max of 6 tabs in 24 hours 30 capsule 0  . chlorthalidone (HYGROTON) 25 MG tablet TAKE ONE TABLET BY MOUTH ONCE DAILY 30 tablet 5  . EPITOL 200 MG tablet TAKE TWO TABLETS BY MOUTH TWICE DAILY 360 tablet 1  . metoprolol (LOPRESSOR) 50 MG tablet TAKE ONE-HALF TABLET BY MOUTH TWICE DAILY 30 tablet 5  . amoxicillin-clavulanate (AUGMENTIN) 875-125 MG tablet Take 1 tablet by mouth every 12 (twelve) hours. 14 tablet 0   No facility-administered medications prior to visit.     EXAM:  BP 126/80 mmHg  Temp(Src) 97.8 F (36.6 C) (Oral)  Wt 135 lb (61.236 kg)  Body mass index is 22.12 kg/(m^2).  GENERAL: vitals reviewed and listed above, alert, oriented, appears well hydrated and in no acute distress HEENT: atraumatic, conjunctiva  clear, no obvious abnormalities on inspection of external nose and earsLUNGS: clear to auscultation bilaterally, no wheezes, rales or rhonchi, good air movement CV: HRRR, no clubbing cyanosis or   peripheral edema nl cap refill  Abdomen:  Sof,t normal bowel sounds without hepatosplenomegaly, no guarding rebound or masses no CVA tenderness MS: moves all extremities without noticeable focal  abnormality PSYCH: pleasant and cooperative, no obvious depression or anxiety ua mod wbc pos nitrities  ASSESSMENT AND PLAN:  Discussed the following assessment and plan:  Acute UTI  Dysuria - Plan: POC Urinalysis Dipstick, Culture, Urine  Frequency of urination - Plan: POC Urinalysis Dipstick, Culture, Urine Hx pyleo and urosepsis  ceftin x 5 days culture pending reviewed poss set up for recurrent utis  Past note uro no new eval before the pyelo.  -Patient advised to return or notify health care team  if symptoms worsen ,persist or new concerns arise.  Patient Instructions  You have uti   Take antibiotic .     As tested If any sx at end of medicine .     Contact us  Will let you know  What culture shows.    [name]. Jaspreet Hollings M.D.

## 2015-05-25 LAB — URINE CULTURE: Colony Count: >=100000

## 2015-05-26 ENCOUNTER — Ambulatory Visit (INDEPENDENT_AMBULATORY_CARE_PROVIDER_SITE_OTHER): Payer: Medicare Other | Admitting: Neurology

## 2015-05-26 ENCOUNTER — Telehealth: Payer: Self-pay | Admitting: Internal Medicine

## 2015-05-26 ENCOUNTER — Encounter: Payer: Self-pay | Admitting: Neurology

## 2015-05-26 VITALS — BP 136/84 | HR 59 | Ht 66.0 in | Wt 137.0 lb

## 2015-05-26 DIAGNOSIS — R51 Headache: Secondary | ICD-10-CM

## 2015-05-26 DIAGNOSIS — R519 Headache, unspecified: Secondary | ICD-10-CM | POA: Insufficient documentation

## 2015-05-26 MED ORDER — GABAPENTIN 300 MG PO CAPS: 300.0000 mg | ORAL_CAPSULE | Freq: Two times a day (BID) | ORAL | Status: DC

## 2015-05-26 MED ORDER — CIPROFLOXACIN HCL 500 MG PO TABS: 500.0000 mg | ORAL_TABLET | Freq: Two times a day (BID) | ORAL | Status: DC

## 2015-05-26 NOTE — Telephone Encounter (Signed)
Pt was seen on 12/30 for uti and has 1 pill left. Pt is so what better. Please advise

## 2015-05-26 NOTE — Progress Notes (Signed)
NEUROLOGY FOLLOW UP OFFICE NOTE  [name] [id number]  HISTORY OF PRESENT ILLNESS: [name] is a 75 year old right-handed female with hypertension, hyperlipidemia, trigeminal neuralgia, migraine and GERD who follows up for right-sided unilateral headache.  Labs reviewed.  UPDATE: She stopped gabapentin 326m at bedtime because it was ineffective.  She continues to have a headache every other day, for which she takes Excedrin. Current abortive medication:  Excedrin.  Will take butalbital-aspirin-caffeine-codeine if Excedrin ineffective (only uses once a times a month) Antihypertensive medications:  metoprolol 248mtwice daily, amlodipine 1057mbenzapril Antidepressant medications:  none Anticonvulsant medications:  Epitol 400m67mice daily Vitamins/Herbal/Supplements:  Melatonin 10mg30mly Other therapy:  none Sed Rate was 2.  HISTORY: Onset:  She fell and hit the right front of her head about a year ago.  About 5 months later, she developed current headache Location:  Right frontal/maxillary/retro-orbital Quality:  Throbbing, pounding Initial Intensity:  8/10 Aura:  no Prodrome:  no Associated symptoms:  No nausea, photophobia, phonophobia Initial Duration:  She wakes up with them and it usually resolves with Excedrin Initial Frequency:  2 to 3 days per week Triggers/exacerbating factors:  none Relieving factors:  none Activity:  Unable to function  Past abortive medication:  none Past preventative medication:  none Other past therapy:  none  She had an MRI of the brain with and without contrast on 12/07/11 to evaluate etiology of trigeminal neuralgia.  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   She has history of migraines and tension type headaches since childhood.  Typical migraines associated with visual aura, numbness and tingling of arms, and nausea.  She also has right sided  trigeminal neuralgia, for which she takes Epitol 400mg 79me daily.  [facility] MEDICAL HISTORY: Past Medical History  Diagnosis Date  . GERD (gastroesophageal reflux disease)   . Hyperlipidemia   . Hypertension     echo nl lv function  mild dilitation 2009  . Migraine     hypnic HA eval by Dr. AdelmaEarley Favore past  . Abnormal blood finding     elevated Hg and hct    . 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 Hearing aid worn   . Polycythemia   . Trigeminal neuralgia pain   . Closed head injury 02/01/2011    from syncope and had scalp laceration  neg ct .    . SyncMarland Kitchenpe 02/01/2011    In shower on vacation  sustained head laceration  8 sutures Had ed visit neg head ct labs and x ray   . ADJ DISORDER WITH MIXED ANXIETY & DEPRESSED MOOD 03/03/2010    Qualifier: Diagnosis of  By: PanoshRegis Billanda Standley Brookingedication side effect 09/02/2010    Poss muscle se of 10 crestor   . Gall stones 2016    see ct scan neg HIDA     MEDICATIONS: Current Outpatient Prescriptions on File Prior to Visit  Medication Sig Dispense Refill  . albuterol (PROVENTIL HFA;VENTOLIN HFA) 108 (90 BASE) MCG/ACT inhaler Inhale 2 puffs into the lungs every 6 (six) hours as needed. 1 Inhaler 2  . amLODipine (NORVASC) 10 MG tablet TAKE ONE TABLET BY MOUTH ONCE DAILY 90 tablet 1  . benazepril (LOTENSIN) 20 MG tablet TAKE ONE TABLET BY MOUTH ONCE DAILY 90 tablet 1  . butalbital-aspirin-caffeine-codeine (FIORINAL WITH CODEINE) 50-32531-497-02-63  MG capsule Take 1 capsule by mouth every 4 (four) hours as needed for migraine. 1-2 by mouth every 4-6 hours as needed max of 6 tabs in 24 hours 30 capsule 0  . cefUROXime (CEFTIN) 500 MG tablet Take 1 tablet (500 mg total) by mouth 2 (two) times daily with a meal. 10 tablet 0  . chlorthalidone (HYGROTON) 25 MG tablet TAKE ONE TABLET BY MOUTH ONCE DAILY 30 tablet 5  . EPITOL 200 MG tablet TAKE TWO TABLETS BY MOUTH TWICE DAILY 360 tablet 1  . metoprolol  (LOPRESSOR) 50 MG tablet TAKE ONE-HALF TABLET BY MOUTH TWICE DAILY 30 tablet 5   No current facility-administered medications on file prior to visit.    ALLERGIES: Allergies  Allergen Reactions  . Sulfamethoxazole-Trimethoprim     REACTION: unspecified    FAMILY HISTORY: Family History  Problem Relation Age of Onset  . Ovarian cancer Mother   . Stroke Mother   . Alcohol abuse Father   . Stroke Father   . Seizures Daughter   . Hypertension    . Diabetes Brother   . Cancer Paternal Aunt     leukemia, unknown type  . Colon cancer Neg Hx     SOCIAL HISTORY: Social History   Social History  . Marital Status: Married    Spouse Name: N/A  . Number of Children: 2  . Years of Education: N/A   Occupational History  .      retired Forensic psychologist   Social History Main Topics  . Smoking status: Never Smoker   . Smokeless tobacco: Never Used  . Alcohol Use: 1.2 oz/week    0 Standard drinks or equivalent, 2 Glasses of wine per week  . Drug Use: No  . Sexual Activity: Not on file   Other Topics Concern  . Not on file   Social History Narrative   Married   HH of 2-3 (god daughter)   Pets 2 dogs   Non smoker    Child is a physician   G2P2                REVIEW OF SYSTEMS: Constitutional: No fevers, chills, or sweats, no generalized fatigue, change in appetite Eyes: No visual changes, double vision, eye pain Ear, nose and throat: No hearing loss, ear pain, nasal congestion, sore throat Cardiovascular: No chest pain, palpitations Respiratory:  No shortness of breath at rest or with exertion, wheezes GastrointestinaI: No nausea, vomiting, diarrhea, abdominal pain, fecal incontinence Genitourinary:  No dysuria, urinary retention or frequency Musculoskeletal:  No neck pain, back pain Integumentary: No rash, pruritus, skin lesions Neurological: as above Psychiatric: No depression, insomnia, anxiety Endocrine: No palpitations, fatigue, diaphoresis, mood swings,  change in appetite, change in weight, increased thirst Hematologic/Lymphatic:  No anemia, purpura, petechiae. Allergic/Immunologic: no itchy/runny eyes, nasal congestion, recent allergic reactions, rashes  PHYSICAL EXAM: Filed Vitals:   05/26/15 1338  BP: 136/84  Pulse: 59   General: No acute distress.  Patient appears well-groomed.  normal body habitus. Head:  Normocephalic/atraumatic Eyes:  Fundoscopic exam unremarkable without vessel changes, exudates, hemorrhages or papilledema. Neck: supple, no paraspinal tenderness, full range of motion Heart:  Regular rate and rhythm Lungs:  Clear to auscultation bilaterally Back: No paraspinal tenderness Neurological Exam: alert and oriented to person, place, and time. Attention span and concentration intact, recent and remote memory intact, fund of knowledge intact.  Speech fluent and not dysarthric, language intact.  CN II-XII intact. Fundoscopic exam unremarkable without vessel changes, exudates,  hemorrhages or papilledema.  Bulk and tone normal, muscle strength 5/5 throughout.  Sensation to light touch intact.  Deep tendon reflexes 2+ throughout.  Finger to nose and heel to shin testing intact.  Gait normal  IMPRESSION: Unspecified unilateral headache  PLAN: 1.  Gabapentin was not optimized.  So we will restart gabapentin at 374m twice daily.  She is to call in 4 weeks for update and we can increase dose further if needed. 2.  Limit use of Excedrin to no more than 2 days out of the week 3.  Follow up in 3 months.  15 minutes spent face to face with patient, over 50% spent discussing management.  AMetta Clines DO  CC:  WShanon Ace MD

## 2015-05-26 NOTE — Telephone Encounter (Signed)
Spoke to the pt.  Gave urine culture results.  Medication sent to the pharmacy by e-scribe.

## 2015-05-26 NOTE — Telephone Encounter (Signed)
Spoke to the pt.  Continues to have sx but not as severe as before.  Continues to have urinary discomfort with burning.  No fever or chills.  Declined abdominal, back and flank pain. Please advise.  Thanks!  Cell:  [phone]

## 2015-05-26 NOTE — Telephone Encounter (Signed)
Please see  Result note  Can change to cipro 500 bid for 5 days    Make sure she is not allergic

## 2015-05-26 NOTE — Patient Instructions (Signed)
1.  Restart gabapentin 321m to 1 capsule twice daily (one in AM and one at bedtime).  CALL IN 4 WEEKS WITH UPDATE AND WE CAN ADJUST DOSE IF NEEDED 2.  Limit use of Excedrin to no more than 2 days out of the week 3.  Follow up in 3 months.

## 2015-05-30 ENCOUNTER — Other Ambulatory Visit: Payer: Self-pay | Admitting: Internal Medicine

## 2015-06-02 NOTE — Telephone Encounter (Signed)
Sent to the pharmacy by e-scribe.  Pt has upcoming appt on 09/23/15.

## 2015-06-11 ENCOUNTER — Other Ambulatory Visit: Payer: Self-pay | Admitting: Internal Medicine

## 2015-06-11 NOTE — Telephone Encounter (Signed)
Sent to the pharmacy by e-scribe. 

## 2015-06-15 ENCOUNTER — Telehealth: Payer: Self-pay | Admitting: Neurology

## 2015-06-15 MED ORDER — NORTRIPTYLINE HCL 10 MG PO CAPS: 10.0000 mg | ORAL_CAPSULE | Freq: Every day | ORAL | Status: DC

## 2015-06-15 NOTE — Telephone Encounter (Signed)
Pt needs to talk to someone about her gabapentin she is having trouble and cant take it please call [phone]

## 2015-06-15 NOTE — Telephone Encounter (Signed)
Patient was prescribed 600 mg of gabapentin daily. States she was not able to tolerate that dose. Cut dose in half, was taking 300 mg QHS. States when she woke in the morning she felt drunk, running into walls, and couldn't keep her balance. Has now d/x gabapentin completely. Would like to know if there is another treatment. Please advise.

## 2015-06-15 NOTE — Telephone Encounter (Signed)
Nortriptyline 13m at bedtime.  She is to call in 4 weeks with update and we can increase dose at that time if needed.

## 2015-06-15 NOTE — Telephone Encounter (Signed)
Medication sent in. Patient out of town for the next 3 days. Will pick up when she returns.

## 2015-06-29 ENCOUNTER — Telehealth: Payer: Self-pay | Admitting: Family Medicine

## 2015-06-29 NOTE — Telephone Encounter (Signed)
Pt called and left a message on my machine.  Stated she is working on another problem and requested a call back.

## 2015-06-29 NOTE — Telephone Encounter (Signed)
Left a message for the pt.  Advised that if she is sick/ill etc than she should call the front desk.  If she needed a rx refill than call the pharmacy and ask for an electronic request.

## 2015-06-30 ENCOUNTER — Encounter: Payer: Self-pay | Admitting: Internal Medicine

## 2015-06-30 ENCOUNTER — Ambulatory Visit (INDEPENDENT_AMBULATORY_CARE_PROVIDER_SITE_OTHER): Payer: Medicare Other | Admitting: Internal Medicine

## 2015-06-30 VITALS — BP 138/90 | Temp 97.7°F | Ht 66.0 in | Wt 140.5 lb

## 2015-06-30 DIAGNOSIS — R399 Unspecified symptoms and signs involving the genitourinary system: Secondary | ICD-10-CM

## 2015-06-30 DIAGNOSIS — R062 Wheezing: Secondary | ICD-10-CM

## 2015-06-30 DIAGNOSIS — R300 Dysuria: Secondary | ICD-10-CM | POA: Diagnosis not present

## 2015-06-30 DIAGNOSIS — N390 Urinary tract infection, site not specified: Secondary | ICD-10-CM

## 2015-06-30 DIAGNOSIS — Z87448 Personal history of other diseases of urinary system: Secondary | ICD-10-CM

## 2015-06-30 DIAGNOSIS — Z87440 Personal history of urinary (tract) infections: Secondary | ICD-10-CM

## 2015-06-30 DIAGNOSIS — R3989 Other symptoms and signs involving the genitourinary system: Secondary | ICD-10-CM | POA: Diagnosis not present

## 2015-06-30 LAB — POCT URINALYSIS DIPSTICK
Bilirubin, UA: NEGATIVE
Blood, UA: NEGATIVE
Glucose, UA: NEGATIVE
Ketones, UA: NEGATIVE
Leukocytes, UA: NEGATIVE
Nitrite, UA: NEGATIVE
Protein, UA: NEGATIVE
Spec Grav, UA: 1.025
Urobilinogen, UA: 0.2
pH, UA: 6

## 2015-06-30 MED ORDER — CIPROFLOXACIN HCL 500 MG PO TABS: 500.0000 mg | ORAL_TABLET | Freq: Two times a day (BID) | ORAL | Status: DC

## 2015-06-30 NOTE — Patient Instructions (Signed)
I would like you to see urology .    ? Need for bladder cystoscopy .  Another culture   Today   Restart antibiotic.   You have a wheeze  Get chest x ray .

## 2015-06-30 NOTE — Telephone Encounter (Signed)
Patient is having problems with her bladder, made her an appt for today with Dr. Regis Bill.

## 2015-06-30 NOTE — Progress Notes (Signed)
Pre visit review using our clinic review tool, if applicable. No additional management support is needed unless otherwise documented below in the visit note.  Chief Complaint  Patient presents with  . Nausea  . Dysuria    HPI: Patient [name]  comes in today for SDA for  problem evaluation. Has hx of recurrent uti   hasn't really been right since a few weeks after hospitalization has been treated 3 times with antibiotics for UTI. Not sure it ever got better. She now describes  Urinary discomfort  Almost burning  . At top of urethra   Not  ehn uirnates at the time .  nauseasted most of time.    Tired all the time.  no fever or weight loss it doesn't have the energy to do things. Denies chest pain or shortness of breath does have an occasional cough. Has an albuterol inhaler at home hasn't been using it recently. She doesn't think any of this is reflux disease but does have a tight feeling in her upper throat.  There is no weight loss.  ROS: See pertinent positives and negatives per HPI.  Past Medical History  Diagnosis Date  . GERD (gastroesophageal reflux disease)   . Hyperlipidemia   . Hypertension     echo nl lv function  mild dilitation 2009  . Migraine     hypnic HA eval by Dr. Earley Favor in the past  . Abnormal blood finding     elevated Hg and hct    . 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 Hearing aid worn   . Polycythemia   . Trigeminal neuralgia pain   . Closed head injury 02/01/2011    from syncope and had scalp laceration  neg ct .    Marland Kitchen Syncope 02/01/2011    In shower on vacation  sustained head laceration  8 sutures Had ed visit neg head ct labs and x ray   . 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 Medication side effect 09/02/2010    Poss muscle se of 10 crestor   . Gall stones 2016    see ct scan neg HIDA     Family History  Problem Relation Age of Onset  . Ovarian cancer Mother   .  Stroke Mother   . Alcohol abuse Father   . Stroke Father   . Seizures Daughter   . Hypertension    . Diabetes Brother   . Cancer Paternal Aunt     leukemia, unknown type  . Colon cancer Neg Hx     Social History   Social History  . Marital Status: Married    Spouse Name: N/A  . Number of Children: 2  . Years of Education: N/A   Occupational History  .      retired Forensic psychologist   Social History Main Topics  . Smoking status: Never Smoker   . Smokeless tobacco: Never Used  . Alcohol Use: 1.2 oz/week    0 Standard drinks or equivalent, 2 Glasses of wine per week  . Drug Use: No  . Sexual Activity: Not Asked   Other Topics Concern  . 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 albuterol (PROVENTIL HFA;VENTOLIN HFA) 108 (90 BASE) MCG/ACT inhaler Inhale 2 puffs into the lungs every 6 (six) hours as needed. 1 Inhaler 2  . amLODipine (NORVASC) 10 MG tablet TAKE ONE TABLET BY MOUTH ONCE DAILY 90 tablet 1  . benazepril (LOTENSIN) 20 MG tablet TAKE ONE TABLET BY MOUTH ONCE DAILY 90 tablet 1  . butalbital-aspirin-caffeine-codeine (FIORINAL WITH CODEINE) 50-325-40-30 MG capsule Take 1 capsule by mouth every 4 (four) hours as needed for migraine. 1-2 by mouth every 4-6 hours as needed max of 6 tabs in 24 hours 30 capsule 0  . chlorthalidone (HYGROTON) 25 MG tablet TAKE ONE TABLET BY MOUTH ONCE DAILY 30 tablet 5  . EPITOL 200 MG tablet TAKE TWO TABLETS BY MOUTH TWICE DAILY 360 tablet 1  . metoprolol (LOPRESSOR) 50 MG tablet TAKE ONE-HALF TABLET BY MOUTH TWICE DAILY 30 tablet 5  . nortriptyline (PAMELOR) 10 MG capsule Take 1 capsule (10 mg total) by mouth at bedtime. 30 capsule 3  . cefUROXime (CEFTIN) 500 MG tablet Take 1 tablet (500 mg total) by mouth 2 (two) times daily with a meal. 10 tablet 0  .  ciprofloxacin (CIPRO) 500 MG tablet Take 1 tablet (500 mg total) by mouth 2 (two) times daily. 10 tablet 0  . gabapentin (NEURONTIN) 300 MG capsule Take 1 capsule (300 mg total) by mouth 2 (two) times daily. 60 capsule 0   No facility-administered medications prior to visit.     EXAM:  BP 138/90 mmHg  Temp(Src) 97.7 F (36.5 C) (Oral)  Ht 5' 6"  (1.676 m)  Wt 140 lb 8 oz (63.73 kg)  BMI 22.69 kg/m2  Body mass index is 22.69 kg/(m^2).  GENERAL: vitals reviewed and listed above, alert, oriented, appears well hydrated and in no acute distress nontoxic but looks like she doesn't feel well HEENT: atraumatic, conjunctiva  clear, no obvious abnormalities on inspection of external nose and ears OP : no lesion edema or exudate  NECK: no obvious masses on inspection palpation  LUNGS: Occasional wheeze expiratory left more than right not clearing with coughing no rales or rhonchi CV: HRRR, no clubbing cyanosis or  peripheral edema nl cap refill  Abdomen soft without or canned [place] no flank pain MS: moves all extremities without noticeable focal  abnormality PSYCH: pleasant and cooperative,  skin color is normal no petechiae. BP Readings from Last 3 Encounters:  06/30/15 138/90  05/26/15 136/84  05/22/15 126/80   Wt Readings from Last 3 Encounters:  06/30/15 140 lb 8 oz (63.73 kg)  05/26/15 137 lb (62.143 kg)  05/22/15 135 lb (61.236 kg)   Lab Results  Component Value Date   WBC 4.0 03/30/2015   HGB 14.1 03/30/2015   HCT 43.0 03/30/2015   PLT 308.0 03/30/2015   GLUCOSE 98 05/06/2015   CHOL 204* 03/30/2015   TRIG 45.0 03/30/2015   HDL 77.20 03/30/2015   LDLDIRECT 135.9 10/16/2012   LDLCALC 117* 03/30/2015   ALT 17 03/30/2015   AST 19 03/30/2015   NA 134* 05/06/2015   K 3.8 05/06/2015   CL 96 05/06/2015   CREATININE 0.87 05/06/2015   BUN 16 05/06/2015   CO2 33* 05/06/2015   TSH 0.68 03/30/2015   INR 1.23 03/12/2015    UA shows trace leukocytes. Last UTI urine culture  showed Escherichia coli in December.   ASSESSMENT AND PLAN:  Discussed the following assessment and plan:  Lower urinary tract symptoms (LUTS)  Urinary tract infection, site not specified  History of recurrent UTIs - Plan: POC Urinalysis Dipstick, Urine culture  Wheeze - Plan: DG Chest 2 View Has had a history of recurrent UTIs and pyelonephritis in the fall winter that necessitated hospitalization. She's having recurrent symptoms today although not as severe and feels bad all over with her nausea. Concern about bladder dysfunction perhaps needing further evaluation cystoscopy etc. She's also having some wheezing today told her to take albuterol get chest x-ray. We'll review the record for any other advice before evaluation. -Patient advised to return or notify health care team  if symptoms worsen ,persist or new concerns arise.  Patient Instructions  I would like you to see urology .    ? Need for bladder cystoscopy .  Another culture   Today   Restart antibiotic.   You have a wheeze  Get chest x ray .       [name]. Kohei Antonellis M.D.

## 2015-07-01 ENCOUNTER — Encounter: Payer: Self-pay | Admitting: Internal Medicine

## 2015-07-02 LAB — URINE CULTURE
Colony Count: NO GROWTH
Organism ID, Bacteria: NO GROWTH

## 2015-07-13 DIAGNOSIS — H2511 Age-related nuclear cataract, right eye: Secondary | ICD-10-CM | POA: Diagnosis not present

## 2015-07-13 DIAGNOSIS — H25811 Combined forms of age-related cataract, right eye: Secondary | ICD-10-CM | POA: Diagnosis not present

## 2015-07-14 ENCOUNTER — Telehealth: Payer: Self-pay | Admitting: Internal Medicine

## 2015-07-14 ENCOUNTER — Telehealth: Payer: Self-pay | Admitting: Neurology

## 2015-07-14 DIAGNOSIS — H2512 Age-related nuclear cataract, left eye: Secondary | ICD-10-CM | POA: Diagnosis not present

## 2015-07-14 NOTE — Telephone Encounter (Signed)
Pt notified to go to [facility] office for chest x-ray.  Given address of [address].

## 2015-07-14 NOTE — Telephone Encounter (Signed)
Patient would to know where she need to go and get her chest x ray, please advise

## 2015-07-14 NOTE — Telephone Encounter (Signed)
[name] called to let you know how she is doing on nortriptyline.  It isn't working, Please call her back.

## 2015-07-15 ENCOUNTER — Ambulatory Visit (INDEPENDENT_AMBULATORY_CARE_PROVIDER_SITE_OTHER)
Admission: RE | Admit: 2015-07-15 | Discharge: 2015-07-15 | Disposition: A | Discharge status: Home / Self Care | Payer: Medicare Other | Source: Ambulatory Visit | Attending: Internal Medicine | Admitting: Internal Medicine

## 2015-07-15 DIAGNOSIS — R062 Wheezing: Secondary | ICD-10-CM | POA: Diagnosis not present

## 2015-07-15 DIAGNOSIS — R05 Cough: Secondary | ICD-10-CM | POA: Diagnosis not present

## 2015-07-15 NOTE — Telephone Encounter (Signed)
Left message on machine for pt to return call to the office.  

## 2015-07-16 ENCOUNTER — Other Ambulatory Visit: Payer: Self-pay | Admitting: Family Medicine

## 2015-07-16 DIAGNOSIS — Z0000 Encounter for general adult medical examination without abnormal findings: Secondary | ICD-10-CM | POA: Diagnosis not present

## 2015-07-16 DIAGNOSIS — N3020 Other chronic cystitis without hematuria: Secondary | ICD-10-CM | POA: Diagnosis not present

## 2015-07-16 DIAGNOSIS — N952 Postmenopausal atrophic vaginitis: Secondary | ICD-10-CM | POA: Diagnosis not present

## 2015-07-16 MED ORDER — BUDESONIDE-FORMOTEROL FUMARATE 160-4.5 MCG/ACT IN AERO: 2.0000 | INHALATION_SPRAY | Freq: Two times a day (BID) | RESPIRATORY_TRACT | Status: DC

## 2015-07-16 NOTE — Telephone Encounter (Signed)
Sent to the pharmacy by e-scribe. 

## 2015-07-27 DIAGNOSIS — H25812 Combined forms of age-related cataract, left eye: Secondary | ICD-10-CM | POA: Diagnosis not present

## 2015-07-27 DIAGNOSIS — H2512 Age-related nuclear cataract, left eye: Secondary | ICD-10-CM | POA: Diagnosis not present

## 2015-08-20 ENCOUNTER — Other Ambulatory Visit: Payer: Self-pay | Admitting: Internal Medicine

## 2015-08-20 NOTE — Telephone Encounter (Signed)
Message sent to the pharmacy stating she should call the office for refills of this med.

## 2015-08-27 ENCOUNTER — Ambulatory Visit: Payer: Medicare Other | Admitting: Neurology

## 2015-09-09 DIAGNOSIS — N952 Postmenopausal atrophic vaginitis: Secondary | ICD-10-CM | POA: Diagnosis not present

## 2015-09-09 DIAGNOSIS — N3020 Other chronic cystitis without hematuria: Secondary | ICD-10-CM | POA: Diagnosis not present

## 2015-09-09 DIAGNOSIS — Z0000 Encounter for general adult medical examination without abnormal findings: Secondary | ICD-10-CM | POA: Diagnosis not present

## 2015-09-22 ENCOUNTER — Other Ambulatory Visit: Payer: Self-pay | Admitting: Family Medicine

## 2015-09-22 ENCOUNTER — Ambulatory Visit: Payer: Medicare Other | Admitting: Neurology

## 2015-09-22 NOTE — Progress Notes (Signed)
Pre visit review using our clinic review tool, if applicable. No additional management support is needed unless otherwise documented below in the visit note.  Chief Complaint  Patient presents with  . Follow-up    HPI: [name] 75 y.o.  Fu number of  Issues . Recurrent utis . Has seen dr Jeffie Pollock and on stem suppression at this time seems to be doing ok  Bp ok  Resp ok  Per patient  HAs :  Has had them all her life and still problem  Dr Tomi Likens   Gabapentin  Se and notriptyline       Getting  triggeres  Allergy and 3- 4 per week .  Uses coffee    Avoid asks for rescue med refill if helpful   Remotely when off all caffiene pre dr Earley Favor has did get better  She has food triggers etc .   continuing 2 cup coffee in am   No other alarmr sx ROS: See pertinent positives and negatives per HPI.  Past Medical History  Diagnosis Date  . GERD (gastroesophageal reflux disease)   . Hyperlipidemia   . Hypertension     echo nl lv function  mild dilitation 2009  . Migraine     hypnic HA eval by Dr. Earley Favor in the past  . Abnormal blood finding     elevated Hg and hct    . 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 Hearing aid worn   . Polycythemia   . Trigeminal neuralgia pain   . Closed head injury 02/01/2011    from syncope and had scalp laceration  neg ct .    Marland Kitchen Syncope 02/01/2011    In shower on vacation  sustained head laceration  8 sutures Had ed visit neg head ct labs and x ray   . 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 Medication side effect 09/02/2010    Poss muscle se of 10 crestor   . Gall stones 2016    see ct scan neg HIDA     Family History  Problem Relation Age of Onset  . Ovarian cancer Mother   . Stroke Mother   . Alcohol abuse Father   . Stroke Father   . Seizures Daughter   . Hypertension    . Diabetes Brother   . Cancer Paternal Aunt     leukemia, unknown type  . Colon cancer Neg Hx      Social History   Social History  . Marital Status: Married    Spouse Name: N/A  . Number of Children: 2  . Years of Education: N/A   Occupational History  .      retired Forensic psychologist   Social History Main Topics  . Smoking status: Never Smoker   . Smokeless tobacco: Never Used  . Alcohol Use: 1.2 oz/week    0 Standard drinks or equivalent, 2 Glasses of wine per week  . Drug Use: No  . Sexual Activity: Not Asked   Other Topics Concern  . 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 albuterol (PROVENTIL HFA;VENTOLIN HFA) 108 (90 BASE) MCG/ACT inhaler Inhale 2 puffs into the lungs every 6 (six) hours as needed. 1 Inhaler 2  . amLODipine (NORVASC) 10 MG tablet TAKE ONE TABLET BY MOUTH ONCE DAILY 90 tablet 1  . benazepril (LOTENSIN) 20 MG tablet TAKE ONE TABLET BY MOUTH ONCE DAILY 90 tablet 1  . budesonide-formoterol (SYMBICORT) 160-4.5 MCG/ACT inhaler Inhale 2 puffs into the lungs 2 (two) times daily. 1 Inhaler 2  . chlorthalidone (HYGROTON) 25 MG tablet TAKE ONE TABLET BY MOUTH ONCE DAILY 30 tablet 5  . ciprofloxacin (CIPRO) 500 MG tablet Take 1 tablet (500 mg total) by mouth 2 (two) times daily. (Patient taking differently: Take 500 mg by mouth daily with breakfast. Per dr Jeffie Pollock urologist) 10 tablet 0  . EPITOL 200 MG tablet TAKE TWO TABLETS BY MOUTH TWICE DAILY 360 tablet 1  . metoprolol (LOPRESSOR) 50 MG tablet TAKE ONE-HALF TABLET BY MOUTH TWICE DAILY 30 tablet 5  . nortriptyline (PAMELOR) 10 MG capsule Take 1 capsule (10 mg total) by mouth at bedtime. 30 capsule 3  . butalbital-aspirin-caffeine-codeine (FIORINAL WITH CODEINE) 50-325-40-30 MG capsule Take 1 capsule by mouth every 4 (four) hours as needed for migraine. 1-2 by mouth every 4-6 hours as needed max of 6 tabs in 24 hours 30  capsule 0   No facility-administered medications prior to visit.     EXAM:  BP 136/80 mmHg  Temp(Src) 97.7 F (36.5 C) (Oral)  Ht 5' 6"  (1.676 m)  Wt 145 lb 4.8 oz (65.908 kg)  BMI 23.46 kg/m2  Body mass index is 23.46 kg/(m^2).  GENERAL: vitals reviewed and listed above, alert, oriented, appears well hydrated and in no acute distress HEENT: atraumatic, conjunctiva  clear, no obvious abnormalities on inspection of external nose and ears eoms nl  NECK: no obvious masses on inspection palpation  LUNGS: clear to auscultation bilaterally, no wheezes, rales or rhonchi, good air movement CV: HRRR, no clubbing cyanosis or  peripheral edema  Vv nl cap refill  MS: moves all extremities without noticeable focal  abnormality PSYCH: pleasant and cooperative, no obvious depression or anxiety Lab Results  Component Value Date   WBC 4.0 03/30/2015   HGB 14.1 03/30/2015   HCT 43.0 03/30/2015   PLT 308.0 03/30/2015   GLUCOSE 98 05/06/2015   CHOL 204* 03/30/2015   TRIG 45.0 03/30/2015   HDL 77.20 03/30/2015   LDLDIRECT 135.9 10/16/2012   LDLCALC 117* 03/30/2015   ALT 17 03/30/2015   AST 19 03/30/2015   NA 134* 05/06/2015   K 3.8 05/06/2015   CL 96 05/06/2015   CREATININE 0.87 05/06/2015   BUN 16 05/06/2015   CO2 33* 05/06/2015   TSH 0.68 03/30/2015   INR 1.23 03/12/2015   BP Readings from Last 3 Encounters:  09/23/15 136/80  06/30/15 138/90  05/26/15 136/84   Wt Readings from Last 3 Encounters:  09/23/15 145 lb 4.8 oz (65.908 kg)  06/30/15 140 lb 8 oz (63.73 kg)  05/26/15 137 lb (62.143 kg)    ASSESSMENT AND PLAN:  Discussed the following assessment and plan:  Essential hypertension - controlled at this time   History of recurrent UTIs - onsuppression dr Jeffie Pollock  Chronic recurrent am  nonintractable headache,  Medication management  -Patient advised to return or notify health care team  if symptoms worsen ,persist or new concerns arise.  Patient Instructions   Caution with rescue medicine  . caffiene withdrawal can trigger headaches  Etc. Rebound.  If blood pressure controlled   Wellness  Preventive visit  In December and labs at that time        [name]. Tayva Easterday M.D.

## 2015-09-23 ENCOUNTER — Encounter: Payer: Self-pay | Admitting: Internal Medicine

## 2015-09-23 ENCOUNTER — Ambulatory Visit (INDEPENDENT_AMBULATORY_CARE_PROVIDER_SITE_OTHER): Payer: Medicare Other | Admitting: Internal Medicine

## 2015-09-23 VITALS — BP 136/80 | Temp 97.7°F | Ht 66.0 in | Wt 145.3 lb

## 2015-09-23 DIAGNOSIS — Z79899 Other long term (current) drug therapy: Secondary | ICD-10-CM | POA: Diagnosis not present

## 2015-09-23 DIAGNOSIS — R51 Headache: Secondary | ICD-10-CM

## 2015-09-23 DIAGNOSIS — Z87440 Personal history of urinary (tract) infections: Secondary | ICD-10-CM

## 2015-09-23 DIAGNOSIS — I10 Essential (primary) hypertension: Secondary | ICD-10-CM | POA: Diagnosis not present

## 2015-09-23 DIAGNOSIS — G8929 Other chronic pain: Secondary | ICD-10-CM

## 2015-09-23 DIAGNOSIS — R519 Headache, unspecified: Secondary | ICD-10-CM

## 2015-09-23 MED ORDER — BUTALBITAL-ASA-CAFF-CODEINE 50-325-40-30 MG PO CAPS: 1.0000 | ORAL_CAPSULE | ORAL | Status: DC | PRN

## 2015-09-23 NOTE — Patient Instructions (Signed)
Caution with rescue medicine  . caffiene withdrawal can trigger headaches  Etc. Rebound.   If blood pressure controlled   Wellness  Preventive visit  In December and labs at that time

## 2015-09-24 NOTE — Telephone Encounter (Signed)
Refilled at the [facility]i.

## 2015-09-27 NOTE — Assessment & Plan Note (Signed)
     Has had them to some degree "all her life" and still problem  Dr Tomi Likens   Gabapentin  Had Se and notriptyline   Not working     Getting  triggers  Allergy and 3- 4 per week .  Uses coffee    Avoid asks for rescue med refill if helpful   Remotely when off all caffiene pre dr Earley Favor has did get better  She has food triggers etc .   continuing 2 cup coffe in am   No other alarmr sx  Will refill med with caution  Pt aware  Disc rebound .

## 2015-10-07 ENCOUNTER — Telehealth: Payer: Self-pay | Admitting: Internal Medicine

## 2015-10-07 ENCOUNTER — Ambulatory Visit: Payer: Self-pay | Admitting: Adult Health

## 2015-10-07 ENCOUNTER — Ambulatory Visit (INDEPENDENT_AMBULATORY_CARE_PROVIDER_SITE_OTHER): Payer: Medicare Other | Admitting: Internal Medicine

## 2015-10-07 ENCOUNTER — Encounter: Payer: Self-pay | Admitting: Internal Medicine

## 2015-10-07 VITALS — BP 110/78 | Temp 97.8°F | Wt 140.0 lb

## 2015-10-07 DIAGNOSIS — G500 Trigeminal neuralgia: Secondary | ICD-10-CM | POA: Diagnosis not present

## 2015-10-07 MED ORDER — CARBAMAZEPINE 200 MG PO TABS: 400.0000 mg | ORAL_TABLET | Freq: Two times a day (BID) | ORAL | Status: DC

## 2015-10-07 NOTE — Progress Notes (Signed)
No chief complaint on file.   HPI: [name] 75 y.o.  comesin for acute work in  For facial pain recurrence  Increase some sxsover last 2 months and then over last 2 days increasing right facial jaw pain  Down jaw line but inside mouth without rash injury fever etc  Pain teeth near cant brush teeth.   Took extra  carbemazapine   200 mg with some help today after  Calling th.  ( nl 400 bid) .     Lots of stress wonder if can trigger   No other systemic sx   Has records from  Dr Cindie Crumbly when at [facility] clinic post surgery  At some piont was on many meds  And did so well was able to wean down to lower dose tegretol ROS: See pertinent positives and negatives per HPI. No fever neurosx otherwise  Past Medical History  Diagnosis Date  . GERD (gastroesophageal reflux disease)   . Hyperlipidemia   . Hypertension     echo nl lv function  mild dilitation 2009  . Migraine     hypnic HA eval by Dr. Earley Favor in the past  . Abnormal blood finding     elevated Hg and hct    . 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 Hearing aid worn   . Polycythemia   . Trigeminal neuralgia pain   . Closed head injury 02/01/2011    from syncope and had scalp laceration  neg ct .    Marland Kitchen Syncope 02/01/2011    In shower on vacation  sustained head laceration  8 sutures Had ed visit neg head ct labs and x ray   . 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 Medication side effect 09/02/2010    Poss muscle se of 10 crestor   . Gall stones 2016    see ct scan neg HIDA     Family History  Problem Relation Age of Onset  . Ovarian cancer Mother   . Stroke Mother   . Alcohol abuse Father   . Stroke Father   . Seizures Daughter   . Hypertension    . Diabetes Brother   . Cancer Paternal Aunt     leukemia, unknown type  . Colon cancer Neg Hx     Social History   Social History  . Marital Status: Married    Spouse Name: N/A  . Number  of Children: 2  . Years of Education: N/A   Occupational History  .      retired Forensic psychologist   Social History Main Topics  . Smoking status: Never Smoker   . Smokeless tobacco: Never Used  . Alcohol Use: 1.2 oz/week    0 Standard drinks or equivalent, 2 Glasses of wine per week  . Drug Use: No  . Sexual Activity: Not Asked   Other Topics Concern  . 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 albuterol (PROVENTIL HFA;VENTOLIN HFA) 108 (90 BASE) MCG/ACT inhaler Inhale 2 puffs into the lungs every 6 (six) hours as  needed. 1 Inhaler 2  . amLODipine (NORVASC) 10 MG tablet TAKE ONE TABLET BY MOUTH ONCE DAILY 90 tablet 1  . benazepril (LOTENSIN) 20 MG tablet TAKE ONE TABLET BY MOUTH ONCE DAILY 90 tablet 1  . budesonide-formoterol (SYMBICORT) 160-4.5 MCG/ACT inhaler Inhale 2 puffs into the lungs 2 (two) times daily. (Patient taking differently: Inhale 2 puffs into the lungs 2 (two) times daily. ) 1 Inhaler 2  . butalbital-aspirin-caffeine-codeine (FIORINAL WITH CODEINE) 50-325-40-30 MG capsule Take 1 capsule by mouth every 4 (four) hours as needed for migraine (rescue med for headache). 1-2 by mouth every 4-6 hours as needed max of 6 tabs in 24 hours 30 capsule 0  . chlorthalidone (HYGROTON) 25 MG tablet TAKE ONE TABLET BY MOUTH ONCE DAILY 30 tablet 5  . ciprofloxacin (CIPRO) 500 MG tablet Take 1 tablet (500 mg total) by mouth 2 (two) times daily. (Patient taking differently: Take 500 mg by mouth daily with breakfast. Per dr Jeffie Pollock urologist) 10 tablet 0  . metoprolol (LOPRESSOR) 50 MG tablet TAKE ONE-HALF TABLET BY MOUTH TWICE DAILY 30 tablet 5  . nortriptyline (PAMELOR) 10 MG capsule Take 1 capsule (10 mg total) by mouth at bedtime. 30 capsule 3  . PREMARIN vaginal cream Place 1 Applicatorful vaginally. 3  days/week  0  . EPITOL 200 MG tablet TAKE TWO TABLETS BY MOUTH TWICE DAILY 360 tablet 1   No facility-administered medications prior to visit.     EXAM:  BP 110/78 mmHg  Temp(Src) 97.8 F (36.6 C) (Oral)  Wt 140 lb (63.504 kg)  Body mass index is 22.61 kg/(m^2).  GENERAL: vitals reviewed and listed above, alert, oriented, appears well hydrated and in no acute distress looks tired uncomfortable no focal motor change of face nor rasbh HEENT: atraumatic, conjunctiva  clear, no obvious abnormalities on inspection of external nose and ears OP : no lesion edema or exudate tongu midline  No lesion seen NECK: no obvious masses on inspection palpation  MS: moves all extremities without noticeable focal  abnormality PSYCH: pleasant and cooperative,   Looks lik has pain  Nl speech   ASSESSMENT AND PLAN:  Discussed the following assessment and plan:  Trigeminal neuralgia - hx sugery decompression  returning in distal  area  no other obv cause optinos discussed in tegretol as planned   cbcdiff fu  May get dr Tomi Likens to comment or advise   As she has appt   onJune for  HAs but would rather get help with these sx ,.  She can contact  [facility]   Total visit 82mns > 50% spent counseling and coordinating care as indicated in above note and in instructions to patient .  -Patient advised to return or notify health care team  if symptoms worsen ,persist or new concerns arise.  Patient Instructions   Ok to increase  To 1000 mg per day   And then 12039mper day if needed to control the pain    (40089m 3 x per day  per day . Tegretol.    We can consider adding  Other meds  Such as topamax  At night ...also  But agree with having you contact  Dr  LinMyra Gianotti   For further advice    Would plan rechceck cbcdiff in a month after increasing dose.  An  Then how you are doing      WanStandley Brookinganosh M.D.

## 2015-10-07 NOTE — Patient Instructions (Signed)
  Ok to increase  To 1000 mg per day   And then 1229m per day if needed to control the pain    (403m  3 x per day  per day . Tegretol.    We can consider adding  Other meds  Such as topamax  At night ...also  But agree with having you contact  Dr  LiMyra Gianottil   For further advice    Would plan rechceck cbcdiff in a month after increasing dose.  An  Then how you are doing

## 2015-10-07 NOTE — Telephone Encounter (Signed)
Patient Name: [name]  DOB: Apr 14, 1941    Initial Comment Caller States has bad nerve pain (has disease of it), had surgery, put on med., med is now not working for the pain.   Nurse Assessment  Nurse: Mallie Mussel, RN, Alveta Heimlich Date/Time Eilene Ghazi Time): 10/07/2015 9:32:15 AM  Confirm and document reason for call. If symptomatic, describe symptoms. You must click the next button to save text entered. ---Caller states that she is is taking Carbomazepine for nerve pain. She has had surgery and the Carbomazepine is no longer helping with her pain. She rates her pain as 9-10 on 0-10 scale at times. The pain is not constant. Sometimes the pain will last as long as 30 seconds. When she has it, she can't talk or move or do anything. She has trigeminal neuralgia.  Has the patient traveled out of the country within the last 30 days? ---No  Does the patient have any new or worsening symptoms? ---Yes  Will a triage be completed? ---Yes  Related visit to physician within the last 2 weeks? ---No  Does the PT have any chronic conditions? (i.e. diabetes, asthma, etc.) ---Yes  List chronic conditions. ---HTN, Trigeminal Neuralgia  Is this a behavioral health or substance abuse call? ---No     Guidelines    Guideline Title Affirmed Question Affirmed Notes  Face Pain [1] SEVERE pain (e.g., excruciating) AND [2] not improved after 2 hours of pain medicine    Final Disposition User   See Physician within 4 Hours (or PCP triage) Mallie Mussel, RN, [place]    Comments  Dr. Regis Bill did not have an appointment within the recommended time frame. The only appointment she had open was 4:00pm. I scheduled her with Dorothyann Peng NP at 11:00am today.   Referrals  REFERRED TO PCP OFFICE   Disagree/Comply: Comply

## 2015-10-07 NOTE — Telephone Encounter (Signed)
Pt has been rescheduled for 10/07/15 @ 4 PM

## 2015-10-24 ENCOUNTER — Encounter: Payer: Self-pay | Admitting: Internal Medicine

## 2015-10-29 ENCOUNTER — Ambulatory Visit (INDEPENDENT_AMBULATORY_CARE_PROVIDER_SITE_OTHER): Payer: Medicare Other | Admitting: Neurology

## 2015-10-29 ENCOUNTER — Encounter: Payer: Self-pay | Admitting: Neurology

## 2015-10-29 VITALS — BP 132/70 | HR 94 | Ht 66.0 in | Wt 142.0 lb

## 2015-10-29 DIAGNOSIS — G500 Trigeminal neuralgia: Secondary | ICD-10-CM | POA: Diagnosis not present

## 2015-10-29 DIAGNOSIS — R519 Headache, unspecified: Secondary | ICD-10-CM

## 2015-10-29 DIAGNOSIS — R51 Headache: Secondary | ICD-10-CM | POA: Diagnosis not present

## 2015-10-29 MED ORDER — NORTRIPTYLINE HCL 25 MG PO CAPS: 25.0000 mg | ORAL_CAPSULE | Freq: Every day | ORAL | Status: DC

## 2015-10-29 NOTE — Progress Notes (Signed)
NEUROLOGY FOLLOW UP OFFICE NOTE  [name] [id number]  HISTORY OF PRESENT ILLNESS: [name] is a 75 year old right-handed female with hypertension, hyperlipidemia, trigeminal neuralgia, migraine and GERD who follows up for right-sided unilateral headache  UPDATE: No change, because not on preventative. Intensity:  8/10 Duration:  Within one hour with Excedrin Frequency:  3 to 4 days per week Current abortive medication:  Excedrin.  Will take butalbital-aspirin-caffeine-codeine if Excedrin ineffective (only uses once a times a month) Antihypertensive medications:  metoprolol 9m twice daily, amlodipine 155m benzapril Antidepressant medications: no.  She was on nortriptyline 1022mwhich was ineffective.  She said she left a message to give us [place] update but nobody got back to her.  So she discontinued it. Anticonvulsant medications:  Epitol 400m38mice daily Vitamins/Herbal/Supplements:  Melatonin 10mg41mly Other therapy:  none  HISTORY: Onset:  She fell and hit the right front of her head about a year ago.  About 5 months later, she developed current headache Location:  Right frontal/maxillary/retro-orbital Quality:  Throbbing, pounding Initial Intensity:  8/10 Aura:  no Prodrome:  no Associated symptoms:  No nausea, photophobia, phonophobia Initial Duration:  She wakes up with them and it usually resolves with Excedrin Initial Frequency:  2 to 3 days per week Triggers/exacerbating factors:  none Relieving factors:  none Activity:  Unable to function  Past abortive medication:  none Past preventative medication:  gabapentin (unable to tolerate) Other past therapy:  none  She had an MRI of the brain with and without contrast on 12/07/11 to evaluate etiology of trigeminal neuralgia.  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   Sed Rate was 2.  She has history of migraines and  tension type headaches since childhood.  Typical migraines associated with visual aura, numbness and tingling of arms, and nausea.    She also has right sided trigeminal neuralgia, for which she takes Epitol 400mg 69me daily.  She [facility] years ago.  About 2 weeks ago, she had a breakthrough attack.  She increased Epitol from 800mg d71m to 1000mg da40mand the pain subsided.  She then reduced it back to 400mg twi61maily.    PAST MEDICAL HISTORY: Past Medical History  Diagnosis Date  . GERD (gastroesophageal reflux disease)   . Hyperlipidemia   . Hypertension     echo nl lv function  mild dilitation 2009  . Migraine     hypnic HA eval by Dr. Adelman iEarley Favorast  . Abnormal blood finding     elevated Hg and hct    . 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 Hearing aid worn   . Polycythemia   . Trigeminal neuralgia pain   . Closed head injury 02/01/2011    from syncope and had scalp laceration  neg ct .    . SyncopeMarland Kitchen9/03/2011    In shower on vacation  sustained head laceration  8 sutures Had ed visit neg head ct labs and x ray   . ADJ DISORDER WITH MIXED ANXIETY & DEPRESSED MOOD 03/03/2010    Qualifier: Diagnosis of  By: [name] MDRegis Billa K  Standley Brookingcation side effect 09/02/2010    Poss muscle se of 10 crestor   . Gall stones 2016    see ct scan neg HIDA     MEDICATIONS: Current Outpatient Prescriptions on File Prior to  Visit  Medication Sig Dispense Refill  . albuterol (PROVENTIL HFA;VENTOLIN HFA) 108 (90 BASE) MCG/ACT inhaler Inhale 2 puffs into the lungs every 6 (six) hours as needed. 1 Inhaler 2  . amLODipine (NORVASC) 10 MG tablet TAKE ONE TABLET BY MOUTH ONCE DAILY 90 tablet 1  . benazepril (LOTENSIN) 20 MG tablet TAKE ONE TABLET BY MOUTH ONCE DAILY 90 tablet 1  . budesonide-formoterol (SYMBICORT) 160-4.5 MCG/ACT inhaler Inhale 2 puffs into the lungs 2 (two) times daily. (Patient taking differently: Inhale 2 puffs into the lungs 2 (two)  times daily. ) 1 Inhaler 2  . butalbital-aspirin-caffeine-codeine (FIORINAL WITH CODEINE) 50-325-40-30 MG capsule Take 1 capsule by mouth every 4 (four) hours as needed for migraine (rescue med for headache). 1-2 by mouth every 4-6 hours as needed max of 6 tabs in 24 hours 30 capsule 0  . carbamazepine (EPITOL) 200 MG tablet Take 2 tablets (400 mg total) by mouth 2 (two) times daily. Can increase to  1200 mg per day 300 tid as directed 360 tablet 1  . chlorthalidone (HYGROTON) 25 MG tablet TAKE ONE TABLET BY MOUTH ONCE DAILY 30 tablet 5  . metoprolol (LOPRESSOR) 50 MG tablet TAKE ONE-HALF TABLET BY MOUTH TWICE DAILY 30 tablet 5  . PREMARIN vaginal cream Place 1 Applicatorful vaginally. 3 days/week  0   No current facility-administered medications on file prior to visit.    ALLERGIES: Allergies  Allergen Reactions  . Sulfamethoxazole-Trimethoprim     REACTION: unspecified    FAMILY HISTORY: Family History  Problem Relation Age of Onset  . Ovarian cancer Mother   . Stroke Mother   . Alcohol abuse Father   . Stroke Father   . Seizures Daughter   . Hypertension    . Diabetes Brother   . Cancer Paternal Aunt     leukemia, unknown type  . Colon cancer Neg Hx     SOCIAL HISTORY: Social History   Social History  . Marital Status: Married    Spouse Name: N/A  . Number of Children: 2  . Years of Education: N/A   Occupational History  .      retired Forensic psychologist   Social History Main Topics  . Smoking status: Never Smoker   . Smokeless tobacco: Never Used  . Alcohol Use: 1.2 oz/week    0 Standard drinks or equivalent, 2 Glasses of wine per week  . Drug Use: No  . Sexual Activity: Not on file   Other Topics Concern  . Not on file   Social History Narrative   Married   HH of 2-3 (god daughter)   Pets 2 dogs   Non smoker    Child is a physician   G2P2                REVIEW OF SYSTEMS: Constitutional: No fevers, chills, or sweats, no generalized fatigue,  change in appetite Eyes: No visual changes, double vision, eye pain Ear, nose and throat: No hearing loss, ear pain, nasal congestion, sore throat Cardiovascular: No chest pain, palpitations Respiratory:  No shortness of breath at rest or with exertion, wheezes GastrointestinaI: No nausea, vomiting, diarrhea, abdominal pain, fecal incontinence Genitourinary:  No dysuria, urinary retention or frequency Musculoskeletal:  No neck pain, back pain Integumentary: No rash, pruritus, skin lesions Neurological: as above Psychiatric: No depression, insomnia, anxiety Endocrine: No palpitations, fatigue, diaphoresis, mood swings, change in appetite, change in weight, increased thirst Hematologic/Lymphatic:  No purpura, petechiae. Allergic/Immunologic: no itchy/runny eyes, nasal congestion,  recent allergic reactions, rashes  PHYSICAL EXAM: Filed Vitals:   10/29/15 1320  BP: 132/70  Pulse: 94   General: No acute distress.  Patient appears well-groomed.  normal body habitus. Head:  Normocephalic/atraumatic Eyes:  Fundi examined but not visualized Neck: supple, no paraspinal tenderness, full range of motion Heart:  Regular rate and rhythm Lungs:  Clear to auscultation bilaterally Back: No paraspinal tenderness Neurological Exam: alert and oriented to person, place, and time. Attention span and concentration intact, recent and remote memory intact, fund of knowledge intact.  Speech fluent and not dysarthric, language intact.  CN II-XII intact. Bulk and tone normal, muscle strength 5/5 throughout.  Sensation to light touch intact.  Deep tendon reflexes 2+ throughout.  Finger to nose and heel to shin testing intact.  Gait normal, Romberg negative.  IMPRESSION: Right sided unilateral headache, possibly migraine but complicated by medication overuse  Right sided trigeminal neuralgia  PLAN: 1.  Restart nortriptyline, but at 59m at bedtime.  She will use MyChart to contact me in 4 weeks with update and  we can further increase dose if needed (to control headache)  2.  Limit use of Excedrin to no more than 2 days out of the week 3.  Continue the Epitol 4076mtwice daily.  If you have a breakthrough trigeminal neuralgia pain, we can increase dose again. 4.  Follow up in 3 to 4 months.    15 minutes spent face to face with patient, over 50% spent discussing management.  AdMetta ClinesDO  CC:  WaShanon AceMD

## 2015-10-29 NOTE — Telephone Encounter (Signed)
If you stay on the tegretol  We probably should still monitor the cbcdiff every 6 months    So  No ov needed but  Still do the cbc diff  At her convenience  Glad the pain got better

## 2015-10-29 NOTE — Patient Instructions (Signed)
1.  Restart nortriptyline, but at 61m at bedtime.  Use MyChart to contact me in 4 weeks with update and we can further increase dose if needed (to control headache)  2.  Limit use of Excedrin to no more than 2 days out of the week 3.  Continue the Epitol 4074mtwice daily.  If you have a breakthrough trigeminal neuralgia pain, we can increase dose again. 4.  Follow up in 3 to 4 months.

## 2015-11-03 ENCOUNTER — Other Ambulatory Visit: Payer: Self-pay | Admitting: Internal Medicine

## 2015-11-03 NOTE — Telephone Encounter (Signed)
Rx refill sent to pharmacy. 

## 2015-11-04 ENCOUNTER — Other Ambulatory Visit: Payer: Self-pay | Admitting: Family Medicine

## 2015-11-04 DIAGNOSIS — Z79899 Other long term (current) drug therapy: Secondary | ICD-10-CM

## 2015-11-04 NOTE — Telephone Encounter (Signed)
Mist have cbcdiff  Ordered    6 months from her last one and let her know ( last one November so due any time now) Thanks Lab Results  Component Value Date   WBC 4.0 03/30/2015   HGB 14.1 03/30/2015   HCT 43.0 03/30/2015   PLT 308.0 03/30/2015   GLUCOSE 98 05/06/2015   CHOL 204* 03/30/2015   TRIG 45.0 03/30/2015   HDL 77.20 03/30/2015   LDLDIRECT 135.9 10/16/2012   LDLCALC 117* 03/30/2015   ALT 17 03/30/2015   AST 19 03/30/2015   NA 134* 05/06/2015   K 3.8 05/06/2015   CL 96 05/06/2015   CREATININE 0.87 05/06/2015   BUN 16 05/06/2015   CO2 33* 05/06/2015   TSH 0.68 03/30/2015   INR 1.23 03/12/2015

## 2015-11-07 ENCOUNTER — Other Ambulatory Visit: Payer: Self-pay | Admitting: Internal Medicine

## 2015-11-09 NOTE — Telephone Encounter (Signed)
Sent to the pharmacy by e-scribe.  Pt has upcoming cpx on 04/26/16

## 2015-11-10 DIAGNOSIS — H04123 Dry eye syndrome of bilateral lacrimal glands: Secondary | ICD-10-CM | POA: Diagnosis not present

## 2015-11-24 ENCOUNTER — Encounter (HOSPITAL_COMMUNITY): Payer: Self-pay | Admitting: Emergency Medicine

## 2015-11-24 ENCOUNTER — Emergency Department (HOSPITAL_COMMUNITY): Payer: Medicare Other

## 2015-11-24 ENCOUNTER — Emergency Department (HOSPITAL_COMMUNITY)
Admission: EM | Admit: 2015-11-24 | Discharge: 2015-11-24 | Disposition: A | Discharge status: Home / Self Care | Payer: Medicare Other | Attending: Emergency Medicine | Admitting: Emergency Medicine

## 2015-11-24 DIAGNOSIS — I10 Essential (primary) hypertension: Secondary | ICD-10-CM | POA: Diagnosis not present

## 2015-11-24 DIAGNOSIS — S069X9A Unspecified intracranial injury with loss of consciousness of unspecified duration, initial encounter: Secondary | ICD-10-CM | POA: Diagnosis not present

## 2015-11-24 DIAGNOSIS — W19XXXA Unspecified fall, initial encounter: Secondary | ICD-10-CM

## 2015-11-24 DIAGNOSIS — Y999 Unspecified external cause status: Secondary | ICD-10-CM | POA: Diagnosis not present

## 2015-11-24 DIAGNOSIS — S0101XA Laceration without foreign body of scalp, initial encounter: Secondary | ICD-10-CM | POA: Diagnosis present

## 2015-11-24 DIAGNOSIS — Z7982 Long term (current) use of aspirin: Secondary | ICD-10-CM | POA: Diagnosis not present

## 2015-11-24 DIAGNOSIS — W0110XA Fall on same level from slipping, tripping and stumbling with subsequent striking against unspecified object, initial encounter: Secondary | ICD-10-CM | POA: Diagnosis not present

## 2015-11-24 DIAGNOSIS — Y929 Unspecified place or not applicable: Secondary | ICD-10-CM | POA: Insufficient documentation

## 2015-11-24 DIAGNOSIS — Y939 Activity, unspecified: Secondary | ICD-10-CM | POA: Insufficient documentation

## 2015-11-24 DIAGNOSIS — S0003XA Contusion of scalp, initial encounter: Secondary | ICD-10-CM | POA: Diagnosis not present

## 2015-11-24 MED ORDER — BACITRACIN ZINC 500 UNIT/GM EX OINT
TOPICAL_OINTMENT | Freq: Two times a day (BID) | CUTANEOUS | Status: DC
  Administered 2015-11-24: 1 via TOPICAL
  Filled 2015-11-24: qty 0.9

## 2015-11-24 MED ORDER — HYDROCODONE-ACETAMINOPHEN 5-325 MG PO TABS
1.0000 | ORAL_TABLET | Freq: Once | ORAL | Status: DC
  Filled 2015-11-24: qty 1

## 2015-11-24 MED ORDER — IBUPROFEN 400 MG PO TABS
600.0000 mg | ORAL_TABLET | Freq: Once | ORAL | Status: AC
  Administered 2015-11-24: 600 mg via ORAL
  Filled 2015-11-24: qty 1

## 2015-11-24 NOTE — ED Notes (Signed)
Patient Alert and oriented X4. Stable and ambulatory. Patient verbalized understanding of the discharge instructions.  Patient belongings were taken by the patient.  

## 2015-11-24 NOTE — ED Notes (Signed)
Ice applied by EMT

## 2015-11-24 NOTE — ED Notes (Signed)
Applied dressing to the left anterior aspect of the forehead.  No active bleeding noted.  Patient advised to leave dressing on for the next 4 hours.  Told to wash out completely in the morning.

## 2015-11-24 NOTE — ED Notes (Addendum)
Pt here with hematoma/abrasion above left eye-bleeding controlled- after she tripped and fell. Pt a/o x 4, no neuro symptoms.

## 2015-11-24 NOTE — ED Notes (Signed)
MD at bedside. 

## 2015-11-24 NOTE — ED Provider Notes (Signed)
CSN: [id number]     Arrival date & time 11/24/15  2006 History  By signing my name below, I, [name], attest that this documentation has been prepared under the direction and in the presence of Debroah Baller, NP Electronically Signed: Soijett Barrera, ED Scribe. 11/24/2015. 8:56 PM.   Chief Complaint  Patient presents with  . Head Laceration      Patient is a 75 y.o. female presenting with scalp laceration. The history is provided by the patient. No language interpreter was used.  Head Laceration This is a new problem. The current episode started 1 to 2 hours ago. The problem occurs rarely. The problem has not changed since onset.Associated symptoms include headaches. Pertinent negatives include no chest pain and no abdominal pain. Nothing aggravates the symptoms. The symptoms are relieved by rest. She has tried nothing for the symptoms. The treatment provided no relief.    [name] is a 75 y.o. female with a PMHx of closed head injury, who presents to the Emergency Department complaining of head laceration onset 45 minutes PTA. She reports that she tripped and fell over a cord on her back porch and obtained a cut above her left eye. Denies pain or injuring any other parts of her body. Patient's husband states that patient did not have LOC but initially for less than one minute she was having associated symptoms of slurred speech following the fall, mild confusion following the fall, and 6/10 HA. Husband reports that her slurred speech and confusion has resolved. She states that she has tried applying pressure without medications for the relief for her symptoms. She denies LOC, bowel/bladder incontinence, blurred vision, double vision, weakness, abdominal pain, CP, and any other symptoms. Denies taking blood thinners at this time. Pt notes that she has had a craniotomy in the past due to trigeminal neuralgia.   Past Medical History  Diagnosis Date  . GERD (gastroesophageal reflux  disease)   . Hyperlipidemia   . Hypertension     echo nl lv function  mild dilitation 2009  . Migraine     hypnic HA eval by Dr. Earley Favor in the past  . Abnormal blood finding     elevated Hg and hct    . 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 Hearing aid worn   . Polycythemia   . Trigeminal neuralgia pain   . Closed head injury 02/01/2011    from syncope and had scalp laceration  neg ct .    Marland Kitchen Syncope 02/01/2011    In shower on vacation  sustained head laceration  8 sutures Had ed visit neg head ct labs and x ray   . 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 Medication side effect 09/02/2010    Poss muscle se of 10 crestor   . Gall stones 2016    see ct scan neg HIDA    Past Surgical History  Procedure Laterality Date  . Doppler echocardiography  2009    nl lv function mild lv dilitation  . Abdominal hysterectomy  2002    tubal  . Cardiac catheterization  2000    chest pains neg  . Rt shoulder surgery    . Oophorectomy  2002  . Appendectomy  2002  . Craniotomy  July 19 13    nerve decompression right trigeminal   . Colonoscopy      multiple  Family History  Problem Relation Age of Onset  . Ovarian cancer Mother   . Stroke Mother   . Alcohol abuse Father   . Stroke Father   . Seizures Daughter   . Hypertension    . Diabetes Brother   . Cancer Paternal Aunt     leukemia, unknown type  . Colon cancer Neg Hx    Social History  Substance Use Topics  . Smoking status: Never Smoker   . Smokeless tobacco: Never Used  . Alcohol Use: 1.2 oz/week    0 Standard drinks or equivalent, 2 Glasses of wine per week   OB History    Gravida Para Term Preterm AB TAB SAB Ectopic Multiple Living   2 2             Review of Systems  Eyes: Negative for visual disturbance.  Cardiovascular: Negative for chest pain.  Gastrointestinal: Negative for abdominal pain.       No bowel incontinence  Genitourinary:        No bladder incontinence  Neurological: Positive for speech difficulty (resolved) and headaches. Negative for weakness.  Psychiatric/Behavioral: Positive for confusion (resolved).  All other systems reviewed and are negative.     Allergies  Hydrocodone; Oxycodone; and Sulfamethoxazole-trimethoprim  Home Medications   Prior to Admission medications   Medication Sig Start Date End Date Taking? Authorizing Provider  albuterol (PROVENTIL HFA;VENTOLIN HFA) 108 (90 BASE) MCG/ACT inhaler Inhale 2 puffs into the lungs every 6 (six) hours as needed. 03/23/15   [name], MD  amLODipine (NORVASC) 10 MG tablet TAKE ONE TABLET BY MOUTH ONCE DAILY 06/11/15   [name], MD  benazepril (LOTENSIN) 20 MG tablet TAKE ONE TABLET BY MOUTH ONCE DAILY 11/09/15   [name], MD  budesonide-formoterol [facility]) 160-4.5 MCG/ACT inhaler Inhale 2 puffs into the lungs 2 (two) times daily. Patient taking differently: Inhale 2 puffs into the lungs 2 (two) times daily.  07/16/15   [name], MD  butalbital-aspirin-caffeine-codeine [facility] WITH CODEINE) [phone] MG capsule Take 1 capsule by mouth every 4 (four) hours as needed for migraine (rescue med for headache). 1-2 by mouth every 4-6 hours as needed max of 6 tabs in 24 hours 09/23/15   [name], MD  carbamazepine (EPITOL) 200 MG tablet Take 2 tablets (400 mg total) by mouth 2 (two) times daily. Can increase to  1200 mg per day 300 tid as directed 10/07/15   [name], MD  chlorthalidone (HYGROTON) 25 MG tablet TAKE ONE TABLET BY MOUTH ONCE DAILY 11/03/15   [name], MD  metoprolol (LOPRESSOR) 50 MG tablet TAKE ONE-HALF TABLET BY MOUTH TWICE DAILY 11/03/15   [name], MD  nortriptyline (PAMELOR) 25 MG capsule Take 1 capsule (25 mg total) by mouth at bedtime. 10/29/15   Pieter Partridge, DO  PREMARIN vaginal cream Place 1 Applicatorful vaginally. 3 days/week 07/16/15   Historical Provider, MD   BP 132/88 mmHg  Pulse 70  Temp(Src)  98.3 F (36.8 C) (Oral)  Resp 18  Ht 5' 6"  (1.676 m)  Wt 63.504 kg  BMI 22.61 kg/m2  SpO2 100% Physical Exam  Constitutional: She is oriented to person, place, and time. She appears well-developed and well-nourished. No distress.  HENT:  Head: Head is with contusion.    Right Ear: Tympanic membrane normal.  Left Ear: Tympanic membrane normal.  Nose: Nose normal.  Mouth/Throat: Uvula is midline, oropharynx is clear and moist and mucous membranes are  normal.  Abrasion and hematoma left forehead/scalp.swelling and tenderness left orbit.  Eyes: Conjunctivae and EOM are normal. Pupils are equal, round, and reactive to light.  Neck: Normal range of motion. Neck supple.  Cardiovascular: Normal rate, regular rhythm and normal heart sounds.   Pulmonary/Chest: Effort normal and breath sounds normal.  Abdominal: Soft. Bowel sounds are normal. There is no tenderness.  Musculoskeletal: Normal range of motion. She exhibits no edema.  Radial and pedal pulses 2+, adequate circulation, good touch sensation.  Neurological: She is alert and oriented to person, place, and time. She has normal strength. No cranial nerve deficit or sensory deficit. She displays a negative Romberg sign. Gait normal.  Skin: Skin is warm and dry.  wound  Psychiatric: She has a normal mood and affect. Her behavior is normal. Thought content normal.  Nursing note and vitals reviewed.   ED Course  Procedures (including critical care time) DIAGNOSTIC STUDIES: Oxygen Saturation is 97% on RA, nl by my interpretation.    COORDINATION OF CARE: 8:51 PM Discussed treatment plan with pt at bedside which includes CT head, ice, norco, and pt agreed to plan. Patient declined Norco and request ibuprofen. Ibuprofen 600 mg PO given.  8:55 PM- Dr. Alfonse Spruce in to evaluate pt and recommends CT head.    Imaging Review Ct Head Wo Contrast  11/24/2015  CLINICAL DATA:  Slip and fall striking head on concrete patio tonight. Unknown loss of  consciousness. EXAM: CT HEAD WITHOUT CONTRAST TECHNIQUE: Contiguous axial images were obtained from the base of the skull through the vertex without intravenous contrast. COMPARISON:  None. FINDINGS: Brain: No intracranial hemorrhage, mass effect, or midline shift. No hydrocephalus. The basilar cisterns are patent. No evidence of territorial infarct. No intracranial fluid collection. Vascular: No hyperdense vessel or abnormal calcification. Skull: Right occipital craniotomy.  No calvarial fracture. Sinuses/Orbits: Included paranasal sinuses and mastoid air cells are well aerated. Bilateral nasal bone fractures appear remote. Other: Left frontal scalp hematoma and laceration. IMPRESSION: 1. Left frontal scalp hematoma without fracture or acute intracranial abnormality. 2. Post right occipital craniotomy. Electronically Signed   By: Jeb Levering M.D.   On: 11/24/2015 21:30   I have personally reviewed and evaluated these images as part of my medical decision-making.   MDM  75 y.o. female with headache after fall and hitting head stable for d/c without focal neuro deficits and normal CT scan of head. Pain improved with ibuprofen and ice to hematoma of forehead. Discussed with the patient and her husband clinica and CT findings and plan of care. All questioned fully answered. Head injury instructions given to patient's husband. They will return for any problems.   Final diagnoses:  Hematoma of scalp, initial encounter  Fall, initial encounter    I personally performed the services described in this documentation, which was scribed in my presence. The recorded information has been reviewed and is accurate.    [place], NP 11/25/15 [id number]  Harvel Quale, MD 11/26/15 [phone]

## 2015-11-27 ENCOUNTER — Ambulatory Visit (INDEPENDENT_AMBULATORY_CARE_PROVIDER_SITE_OTHER): Payer: Medicare Other | Admitting: Internal Medicine

## 2015-11-27 ENCOUNTER — Encounter: Payer: Self-pay | Admitting: Internal Medicine

## 2015-11-27 VITALS — BP 136/82 | Temp 98.4°F | Ht 66.0 in | Wt 141.1 lb

## 2015-11-27 DIAGNOSIS — Z9181 History of falling: Secondary | ICD-10-CM

## 2015-11-27 DIAGNOSIS — S0990XS Unspecified injury of head, sequela: Secondary | ICD-10-CM | POA: Diagnosis not present

## 2015-11-27 DIAGNOSIS — S0990XA Unspecified injury of head, initial encounter: Secondary | ICD-10-CM | POA: Insufficient documentation

## 2015-11-27 NOTE — Progress Notes (Signed)
Pre visit review using our clinic review tool, if applicable. No additional management support is needed unless otherwise documented below in the visit note.  Chief Complaint  Patient presents with  . ED Follow Up    HPI: [name] 75 y.o.      Comes in for ed visit for  falling   And had laceration head injury hematoma to left forehead tem area  She has hx of tripping falling    In past  But no CV Fabens with this  12 19 am  Occurrence  At dusk taking stuff to trash quickely and tripped over  / hsose going to pool fell no loc but husband felt she wasn't making sence   No etoh  N? No lOC  Bled a lot and hurt a lot so eventually wnet to ed .   See eval   Tends to be clumsy but no change    No dizziness   New ha vomiting or vision change   Had cataract surgery  This year  She sees Dr.  Tomi Likens for  Ha neuralgia   NORTRIPTYLINE   At night   No help so far .   w headaches  No  New meds  Otherwise    MDM  75 y.o. female with headache after fall and hitting head stable for d/c without focal neuro deficits and normal CT scan of head. Pain improved with ibuprofen and ice to hematoma of forehead. Discussed with the patient and her husband clinica and CT findings and plan of care. All questioned fully answered. Head injury instructions given to patient's husband. They will return for any problems.       ROS: See pertinent positives and negatives per HPI. No vomiting  Has lots of brusing on face  bp has been good   Past Medical History  Diagnosis Date  . GERD (gastroesophageal reflux disease)   . Hyperlipidemia   . Hypertension     echo nl lv function  mild dilitation 2009  . Migraine     hypnic HA eval by Dr. Earley Favor in the past  . Abnormal blood finding     elevated Hg and hct    . 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 Hearing aid worn   . Polycythemia   . Trigeminal neuralgia pain   . Closed head injury 02/01/2011    from syncope and had scalp laceration  neg ct .    Marland Kitchen Syncope  02/01/2011    In shower on vacation  sustained head laceration  8 sutures Had ed visit neg head ct labs and x ray   . 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 Medication side effect 09/02/2010    Poss muscle se of 10 crestor   . Gall stones 2016    see ct scan neg HIDA   . Closed head injury 11/27/2015    Family History  Problem Relation Age of Onset  . Ovarian cancer Mother   . Stroke Mother   . Alcohol abuse Father   . Stroke Father   . Seizures Daughter   . Hypertension    . Diabetes Brother   . Cancer Paternal Aunt     leukemia, unknown type  . Colon cancer Neg Hx     Social History   Social History  . Marital Status: Married    Spouse Name:  N/A  . Number of Children: 2  . Years of Education: N/A   Occupational History  .      retired Forensic psychologist   Social History Main Topics  . Smoking status: Never Smoker   . Smokeless tobacco: Never Used  . Alcohol Use: 1.2 oz/week    0 Standard drinks or equivalent, 2 Glasses of wine per week  . Drug Use: No  . Sexual Activity: Not Asked   Other Topics Concern  . 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 albuterol (PROVENTIL HFA;VENTOLIN HFA) 108 (90 BASE) MCG/ACT inhaler Inhale 2 puffs into the lungs every 6 (six) hours as needed. 1 Inhaler 2  . amLODipine (NORVASC) 10 MG tablet TAKE ONE TABLET BY MOUTH ONCE DAILY 90 tablet 1  . benazepril (LOTENSIN) 20 MG tablet TAKE ONE TABLET BY MOUTH ONCE DAILY 90 tablet 1  . budesonide-formoterol (SYMBICORT) 160-4.5 MCG/ACT inhaler Inhale 2 puffs into the lungs 2 (two) times daily. (Patient taking differently: Inhale 2 puffs into the lungs 2 (two) times daily. ) 1 Inhaler 2  . butalbital-aspirin-caffeine-codeine (FIORINAL  WITH CODEINE) 50-325-40-30 MG capsule Take 1 capsule by mouth every 4 (four) hours as needed for migraine (rescue med for headache). 1-2 by mouth every 4-6 hours as needed max of 6 tabs in 24 hours 30 capsule 0  . carbamazepine (EPITOL) 200 MG tablet Take 2 tablets (400 mg total) by mouth 2 (two) times daily. Can increase to  1200 mg per day 300 tid as directed 360 tablet 1  . chlorthalidone (HYGROTON) 25 MG tablet TAKE ONE TABLET BY MOUTH ONCE DAILY 30 tablet 5  . metoprolol (LOPRESSOR) 50 MG tablet TAKE ONE-HALF TABLET BY MOUTH TWICE DAILY 30 tablet 5  . nortriptyline (PAMELOR) 25 MG capsule Take 1 capsule (25 mg total) by mouth at bedtime. 30 capsule 3  . PREMARIN vaginal cream Place 1 Applicatorful vaginally. 3 days/week  0   No facility-administered medications prior to visit.     EXAM:  BP 136/82 mmHg  Temp(Src) 98.4 F (36.9 C) (Oral)  Ht 5' 6" (1.676 m)  Wt 141 lb 1.6 oz (64.003 kg)  BMI 22.79 kg/m2  Body mass index is 22.79 kg/(m^2).  GENERAL: vitals reviewed and listed above, alert, oriented, appears well hydrated and in no acute distressWith obvious hematoma and bruising around the left eye but normal conjunctiva and affect. HEENT:conjunctiva  clear, no obvious abnormalities on inspection of external nose and ears OP : no lesion edema or exudate  NECK: no obvious masses on inspection palpation  has superficial laceration left forehead temporal area multiple areas of bruising below the left eye dependent nontender orbit EOMs are full. TMs clear. Face is seemingly symmetrical.  CV: HRRR, no clubbing cyanosis or  peripheral edema nl cap refill  MS: moves all extremities without noticeable focal  abnormality PSYCH: pleasant and cooperative, no obvious depression or anxiety Neurological looks grossly intact and nonfocal she gets up from a chair easily walks well turns well heel-to-toe some difficulty but appears to be good for her age. Cognitively appears intact. ASSESSMENT AND  PLAN:  Discussed the following assessment and plan:  Closed head injury, sequela  Hx of fall She has a history of multiple falls usually a trip outside or at one point had presyncopal in the shower see past records. She is on a number of medicines but denies any specific dizziness cardiovascular symptoms. I think that distraction is the biggest part of her falling and we discussed this. Consider appropriate exercises for prevention. She can also discuss with Dr. Tomi Likens about potential medications causing imbalance or helping it. Total visit 10mns > 50% spent counseling and coordinating care as indicated in above note and in instructions to patient .   -Patient advised to return or notify health care team  if symptoms worsen ,persist or new concerns arise.  Patient Instructions  Attend to fall prevention.  considier  Yoga tai chi  other  Exercise for balance maintenance .  I think distraction  Is going on  Also.      WStandley Brooking [name] M.D.

## 2015-11-27 NOTE — Patient Instructions (Addendum)
Attend to fall prevention.  considier  Yoga tai chi  other  Exercise for balance maintenance .  I think distraction  Is going on  Also.

## 2015-12-04 ENCOUNTER — Other Ambulatory Visit (INDEPENDENT_AMBULATORY_CARE_PROVIDER_SITE_OTHER): Payer: Medicare Other

## 2015-12-04 DIAGNOSIS — Z79899 Other long term (current) drug therapy: Secondary | ICD-10-CM

## 2015-12-04 DIAGNOSIS — E876 Hypokalemia: Secondary | ICD-10-CM | POA: Diagnosis not present

## 2015-12-04 LAB — CBC WITH DIFFERENTIAL/PLATELET
Basophils Absolute: 0 10*3/uL (ref 0.0–0.1)
Basophils Relative: 0.8 % (ref 0.0–3.0)
Eosinophils Absolute: 0.2 10*3/uL (ref 0.0–0.7)
Eosinophils Relative: 5.2 % — ABNORMAL HIGH (ref 0.0–5.0)
HCT: 45.3 % (ref 36.0–46.0)
Hemoglobin: 15.4 g/dL — ABNORMAL HIGH (ref 12.0–15.0)
Lymphocytes Relative: 28.7 % (ref 12.0–46.0)
Lymphs Abs: 1.2 10*3/uL (ref 0.7–4.0)
MCHC: 34 g/dL (ref 30.0–36.0)
MCV: 89.2 fl (ref 78.0–100.0)
Monocytes Absolute: 0.5 10*3/uL (ref 0.1–1.0)
Monocytes Relative: 10.8 % (ref 3.0–12.0)
Neutro Abs: 2.3 10*3/uL (ref 1.4–7.7)
Neutrophils Relative %: 54.5 % (ref 43.0–77.0)
Platelets: 254 10*3/uL (ref 150.0–400.0)
RBC: 5.07 Mil/uL (ref 3.87–5.11)
RDW: 12.8 % (ref 11.5–15.5)
WBC: 4.3 10*3/uL (ref 4.0–10.5)

## 2015-12-04 LAB — POTASSIUM: Potassium: 3.5 mEq/L (ref 3.5–5.1)

## 2015-12-09 ENCOUNTER — Other Ambulatory Visit: Payer: Self-pay | Admitting: Internal Medicine

## 2015-12-10 NOTE — Telephone Encounter (Signed)
Sent to the pharmacy by e-scribe. 

## 2015-12-11 ENCOUNTER — Other Ambulatory Visit: Payer: Self-pay | Admitting: Internal Medicine

## 2015-12-11 NOTE — Telephone Encounter (Signed)
Sent to the pharmacy by e-scribe.  Pt has upcoming cpx on 04/26/16

## 2015-12-12 ENCOUNTER — Encounter: Payer: Self-pay | Admitting: Neurology

## 2015-12-14 MED ORDER — NORTRIPTYLINE HCL 50 MG PO CAPS: 50.0000 mg | ORAL_CAPSULE | Freq: Every day | ORAL | 1 refills | Status: DC

## 2015-12-14 NOTE — Telephone Encounter (Signed)
Please see mychart message.

## 2015-12-15 DIAGNOSIS — N3020 Other chronic cystitis without hematuria: Secondary | ICD-10-CM | POA: Diagnosis not present

## 2015-12-15 DIAGNOSIS — N952 Postmenopausal atrophic vaginitis: Secondary | ICD-10-CM | POA: Diagnosis not present

## 2016-01-01 DIAGNOSIS — H02839 Dermatochalasis of unspecified eye, unspecified eyelid: Secondary | ICD-10-CM | POA: Diagnosis not present

## 2016-01-01 DIAGNOSIS — H26491 Other secondary cataract, right eye: Secondary | ICD-10-CM | POA: Diagnosis not present

## 2016-01-01 DIAGNOSIS — H18412 Arcus senilis, left eye: Secondary | ICD-10-CM | POA: Diagnosis not present

## 2016-01-01 DIAGNOSIS — H18411 Arcus senilis, right eye: Secondary | ICD-10-CM | POA: Diagnosis not present

## 2016-01-01 DIAGNOSIS — Z961 Presence of intraocular lens: Secondary | ICD-10-CM | POA: Diagnosis not present

## 2016-01-02 ENCOUNTER — Encounter: Payer: Self-pay | Admitting: Neurology

## 2016-01-19 DIAGNOSIS — H26492 Other secondary cataract, left eye: Secondary | ICD-10-CM | POA: Diagnosis not present

## 2016-01-22 ENCOUNTER — Encounter: Payer: Self-pay | Admitting: Neurology

## 2016-01-26 ENCOUNTER — Other Ambulatory Visit: Payer: Self-pay | Admitting: *Deleted

## 2016-01-26 MED ORDER — TOPIRAMATE 25 MG PO TABS: 25.0000 mg | ORAL_TABLET | Freq: Every day | ORAL | 1 refills | Status: DC

## 2016-01-27 ENCOUNTER — Encounter: Payer: Self-pay | Admitting: Internal Medicine

## 2016-02-01 ENCOUNTER — Ambulatory Visit (INDEPENDENT_AMBULATORY_CARE_PROVIDER_SITE_OTHER): Payer: Medicare Other | Admitting: Family Medicine

## 2016-02-01 DIAGNOSIS — Z23 Encounter for immunization: Secondary | ICD-10-CM | POA: Diagnosis not present

## 2016-02-25 ENCOUNTER — Telehealth: Payer: Self-pay | Admitting: Neurology

## 2016-02-25 NOTE — Telephone Encounter (Signed)
[name] 18-Oct-2040. She was having a hard time getting into her My Chart. She would like you to please call her [phone]. Thank you

## 2016-02-26 NOTE — Telephone Encounter (Signed)
Pt was given Mychart supports contact info:   [phone] [email or web address]

## 2016-03-01 ENCOUNTER — Ambulatory Visit: Payer: Medicare Other | Admitting: Neurology

## 2016-03-02 ENCOUNTER — Other Ambulatory Visit: Payer: Self-pay | Admitting: Internal Medicine

## 2016-03-04 NOTE — Telephone Encounter (Signed)
Can refill x 1

## 2016-03-04 NOTE — Telephone Encounter (Signed)
Prescription called in to [facility] at the pharmacy.

## 2016-03-30 ENCOUNTER — Other Ambulatory Visit: Payer: Self-pay | Admitting: Neurology

## 2016-04-06 ENCOUNTER — Ambulatory Visit (INDEPENDENT_AMBULATORY_CARE_PROVIDER_SITE_OTHER): Payer: Medicare Other | Admitting: Neurology

## 2016-04-06 ENCOUNTER — Encounter: Payer: Self-pay | Admitting: Neurology

## 2016-04-06 VITALS — BP 118/78 | HR 65 | Ht 66.0 in | Wt 143.0 lb

## 2016-04-06 DIAGNOSIS — R51 Headache: Secondary | ICD-10-CM | POA: Diagnosis not present

## 2016-04-06 DIAGNOSIS — R519 Headache, unspecified: Secondary | ICD-10-CM

## 2016-04-06 DIAGNOSIS — G500 Trigeminal neuralgia: Secondary | ICD-10-CM

## 2016-04-06 MED ORDER — TOPIRAMATE 50 MG PO TABS: 100.0000 mg | ORAL_TABLET | Freq: Every day | ORAL | 2 refills | Status: DC

## 2016-04-06 NOTE — Progress Notes (Signed)
NEUROLOGY FOLLOW UP OFFICE NOTE  [name] [id number]  HISTORY OF PRESENT ILLNESS: [name] is a 75 year old right-handed female with hypertension, hyperlipidemia, trigeminal neuralgia, migraine and GERD who follows up for right-sided unilateral headache and right sided trigeminal neuralgia.   UPDATE: Trigeminal neuralgia is stable.  Headaches are unchanged: Intensity:  8/10 Duration:  Within one hour with Excedrin Frequency:  3 to 4 days per week Current abortive medication:  Excedrin.  Will take butalbital-aspirin-caffeine-codeine if Excedrin ineffective (only uses once a month) Antihypertensive medications:  metoprolol 61m twice daily, amlodipine 159m benzapril Antidepressant medications: no Anticonvulsant medications:  topiramate 5043mEpitol (carbamazepine) 400m17mice daily (for trigeminal neuralgia) Vitamins/Herbal/Supplements:  Melatonin 10mg63mly Other therapy:  none   HISTORY: Onset:  She fell and hit the right front of her head about a year ago.  About 5 months later, she developed current headache Location:  Right frontal/maxillary/retro-orbital Quality:  Throbbing, pounding Initial Intensity:  8/10 Aura:  no Prodrome:  no Associated symptoms:  No nausea, photophobia, phonophobia Initial Duration:  She wakes up with them and it usually resolves with Excedrin Initial Frequency:  2 to 3 days per week Triggers/exacerbating factors:  none Relieving factors:  none Activity:  Unable to function   Past abortive medication:  none Past preventative medication:  gabapentin (unable to tolerate), nortriptyline (did not tolerate) Other past therapy:  none   She had an MRI of the brain with and without contrast on 12/07/11 to evaluate etiology of trigeminal neuralgia.  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    Sed Rate was 2.   She has history of migraines and tension type  headaches since childhood.  Typical migraines associated with visual aura, numbness and tingling of arms, and nausea.     She also has right sided trigeminal neuralgia, for which she takes Epitol 400mg 58me daily.  She underwent [facility] decomp[facility] at [facility] years ago.  About 2 weeks ago, she had a breakthrough attack.  She increased Epitol from 800mg d61m to 1000mg da32mand the pain subsided.  She then reduced it back to 400mg twi35maily.    PAST MEDICAL HISTORY: Past Medical History:  Diagnosis Date  . Abnormal blood finding    elevated Hg and hct    . ADJ DISORDER WITH MIXED ANXIETY & DEPRESSED MOOD 03/03/2010   Qualifier: Diagnosis of  By: [name] MDRegis Billa K  Standley Brookinged head injury 02/01/2011   from syncope and had scalp laceration  neg ct .    . Closed Marland [place] injury 11/27/2015  . Gall stones 2016   see ct scan neg HIDA   . GERD (gastroesophageal reflux disease)   . Hearing aid worn   . Hyperlipidemia   . Hypertension    echo nl lv function  mild dilitation 2009  . Medication side effect 09/02/2010   Poss muscle se of 10 crestor   . Migraine    hypnic HA eval by Dr. Adelman iEarley Favorast  . Polycythemia   . 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 Syncope 02/01/2011   In shower on vacation  sustained head laceration  8 sutures Had ed visit neg head ct labs and x ray   . Trigeminal neuralgia pain     MEDICATIONS: Current Outpatient Prescriptions on File Prior to Visit  Medication Sig Dispense Refill  . albuterol (PROVENTIL HFA;VENTOLIN HFA) 108 (  90 BASE) MCG/ACT inhaler Inhale 2 puffs into the lungs every 6 (six) hours as needed. 1 Inhaler 2  . amLODipine (NORVASC) 10 MG tablet TAKE ONE TABLET BY MOUTH ONCE DAILY 90 tablet 1  . ASCOMP-CODEINE 50-325-40-30 MG capsule TAKE 1-2 CAPSULES BY MOUTH EVERY FOUR TO SIX HOURS AS NEEDED FOR MIGRAINE MAX OF 6 CAPSULES IN 24 HOURS 30 capsule 0  . benazepril (LOTENSIN) 20 MG tablet TAKE ONE TABLET BY MOUTH ONCE DAILY 90 tablet 1  .  budesonide-formoterol (SYMBICORT) 160-4.5 MCG/ACT inhaler Inhale 2 puffs into the lungs 2 (two) times daily. (Patient taking differently: Inhale 2 puffs into the lungs 2 (two) times daily. ) 1 Inhaler 2  . carbamazepine (EPITOL) 200 MG tablet Take 2 tablets (400 mg total) by mouth 2 (two) times daily. Can increase to  1200 mg per day 300 tid as directed 360 tablet 1  . chlorthalidone (HYGROTON) 25 MG tablet TAKE ONE TABLET BY MOUTH ONCE DAILY 30 tablet 5  . EPITOL 200 MG tablet TAKE TWO TABLETS BY MOUTH TWICE DAILY 360 tablet 1  . metoprolol (LOPRESSOR) 50 MG tablet TAKE ONE-HALF TABLET BY MOUTH TWICE DAILY 30 tablet 5  . nortriptyline (PAMELOR) 50 MG capsule Take 1 capsule (50 mg total) by mouth at bedtime. 30 capsule 1  . PREMARIN vaginal cream Place 1 Applicatorful vaginally. 3 days/week  0   No current facility-administered medications on file prior to visit.     ALLERGIES: Allergies  Allergen Reactions  . Hydrocodone Other (See Comments)    Rebound headaches  . Oxycodone Other (See Comments)    Rebound headaches  . Sulfamethoxazole-Trimethoprim     REACTION: unspecified    FAMILY HISTORY: Family History  Problem Relation Age of Onset  . Ovarian cancer Mother   . Stroke Mother   . Alcohol abuse Father   . Stroke Father   . Seizures Daughter   . Hypertension    . Diabetes Brother   . Cancer Paternal Aunt     leukemia, unknown type  . Colon cancer Neg Hx     SOCIAL HISTORY: Social History   Social History  . Marital status: Married    Spouse name: N/A  . Number of children: 2  . Years of education: N/A   Occupational History  .      retired Forensic psychologist   Social History Main Topics  . Smoking status: Never Smoker  . Smokeless tobacco: Never Used  . Alcohol use 1.2 oz/week    2 Glasses of wine per week  . Drug use: No  . Sexual activity: Not on file   Other Topics Concern  . Not on file   Social History Narrative   Married   [place] of 2-3 (god  daughter)   Pets 2 dogs   Non smoker    Child is a physician   G2P2                REVIEW OF SYSTEMS: Constitutional: No fevers, chills, or sweats, no generalized fatigue, change in appetite Eyes: No visual changes, double vision, eye pain Ear, nose and throat: No hearing loss, ear pain, nasal congestion, sore throat Cardiovascular: No chest pain, palpitations Respiratory:  No shortness of breath at rest or with exertion, wheezes GastrointestinaI: No nausea, vomiting, diarrhea, abdominal pain, fecal incontinence Genitourinary:  No dysuria, urinary retention or frequency Musculoskeletal:  No neck pain, back pain Integumentary: No rash, pruritus, skin lesions Neurological: as above Psychiatric: No depression, insomnia, anxiety  Endocrine: No palpitations, fatigue, diaphoresis, mood swings, change in appetite, change in weight, increased thirst Hematologic/Lymphatic:  No purpura, petechiae. Allergic/Immunologic: no itchy/runny eyes, nasal congestion, recent allergic reactions, rashes  PHYSICAL EXAM: Vitals:   04/06/16 0725  BP: 118/78  Pulse: 65   General: No acute distress.  Patient appears well-groomed.  normal body habitus. Head:  Normocephalic/atraumatic Eyes:  Fundi examined but not visualized Neck: supple, no paraspinal tenderness, full range of motion Heart:  Regular rate and rhythm Lungs:  Clear to auscultation bilaterally Back: No paraspinal tenderness Neurological Exam: alert and oriented to person, place, and time. Attention span and concentration intact, recent and remote memory intact, fund of knowledge intact.  Speech fluent and not dysarthric, language intact.  CN II-XII intact. Bulk and tone normal, muscle strength 5/5 throughout.  Sensation to light touch  intact.  Deep tendon reflexes 2+ throughout.  Finger to nose testing intact.  Gait normal  IMPRESSION: Right-sided headache, probable migraine Right sided trigeminal neuralgia  PLAN: 1.  Increase  topiramate to 177m at bedtime 2.  Limit Excedrin or all pain relievers to no more than 2 days out of the week if possible. 3.  Contact me in 6 weeks with update and we can make adjustments.  Follow up in 3 months. 4.  Epitol 4075mtwice daily for trigeminal neuralgia.  15 minutes spent face to face with patient, over 50% spent counseling.  AdMetta ClinesDO  CC:  WaShanon AceMD

## 2016-04-06 NOTE — Patient Instructions (Signed)
1.  We will increase topiramate to 162m at bedtime (take two 584mtablets at bedtime).  Contact me in about 6 weeks with update and we can make adjustments if needed. 2.  Use Excedrin for acute headaches (try to limit to no more than 2 days out of the week if possible to prevent rebound headache) 3.  Follow up in 3 months.

## 2016-04-10 ENCOUNTER — Encounter: Payer: Self-pay | Admitting: Neurology

## 2016-04-11 ENCOUNTER — Telehealth: Payer: Self-pay

## 2016-04-11 MED ORDER — TOPIRAMATE 50 MG PO TABS: 75.0000 mg | ORAL_TABLET | Freq: Every day | ORAL | 2 refills | Status: DC

## 2016-04-11 NOTE — Telephone Encounter (Signed)
Please see mychart message from pt. Pt is talking about increasing her topamax from 50 mg QHS to 100 mg.    "Dr.Jaffe, Since increasing medication I have been experiencing spells o f dizziness and inability to bring my eyes into focus. Do I need to worry about these symptoms?   Thanks.   [name] "

## 2016-04-11 NOTE — Telephone Encounter (Signed)
Try reducing dose to 1 & 1/2 tablet at night and see if this improves symptoms. Thanks

## 2016-04-11 NOTE — Telephone Encounter (Signed)
Mychart message sent back to pt. Medication list updated.

## 2016-04-18 ENCOUNTER — Other Ambulatory Visit: Payer: Self-pay | Admitting: Neurology

## 2016-04-18 ENCOUNTER — Encounter: Payer: Self-pay | Admitting: Neurology

## 2016-04-18 DIAGNOSIS — Z79899 Other long term (current) drug therapy: Secondary | ICD-10-CM | POA: Diagnosis not present

## 2016-04-18 DIAGNOSIS — H5319 Other subjective visual disturbances: Secondary | ICD-10-CM | POA: Diagnosis not present

## 2016-04-18 MED ORDER — VENLAFAXINE HCL ER 37.5 MG PO CP24: 37.5000 mg | ORAL_CAPSULE | Freq: Every day | ORAL | 0 refills | Status: DC

## 2016-04-18 NOTE — Telephone Encounter (Signed)
Please see mychart message and advise.

## 2016-04-19 ENCOUNTER — Other Ambulatory Visit: Payer: Medicare Other | Admitting: Internal Medicine

## 2016-04-19 ENCOUNTER — Other Ambulatory Visit (INDEPENDENT_AMBULATORY_CARE_PROVIDER_SITE_OTHER): Payer: Medicare Other

## 2016-04-19 DIAGNOSIS — Z0000 Encounter for general adult medical examination without abnormal findings: Secondary | ICD-10-CM | POA: Diagnosis not present

## 2016-04-19 LAB — LIPID PANEL
Cholesterol: 234 mg/dL — ABNORMAL HIGH (ref 0–200)
HDL: 93.5 mg/dL (ref >39.00)
LDL Cholesterol: 129 mg/dL — ABNORMAL HIGH (ref 0–99)
NonHDL: 140.87
Total CHOL/HDL Ratio: 3
Triglycerides: 59 mg/dL (ref 0.0–149.0)
VLDL: 11.8 mg/dL (ref 0.0–40.0)

## 2016-04-19 LAB — CBC WITH DIFFERENTIAL/PLATELET
Basophils Absolute: 0 10*3/uL (ref 0.0–0.1)
Basophils Relative: 0.5 % (ref 0.0–3.0)
Eosinophils Absolute: 0.2 10*3/uL (ref 0.0–0.7)
Eosinophils Relative: 3 % (ref 0.0–5.0)
HCT: 43.5 % (ref 36.0–46.0)
Hemoglobin: 14.8 g/dL (ref 12.0–15.0)
Lymphocytes Relative: 15.2 % (ref 12.0–46.0)
Lymphs Abs: 1.1 10*3/uL (ref 0.7–4.0)
MCHC: 34 g/dL (ref 30.0–36.0)
MCV: 90.4 fl (ref 78.0–100.0)
Monocytes Absolute: 0.6 10*3/uL (ref 0.1–1.0)
Monocytes Relative: 7.8 % (ref 3.0–12.0)
Neutro Abs: 5.5 10*3/uL (ref 1.4–7.7)
Neutrophils Relative %: 73.5 % (ref 43.0–77.0)
Platelets: 238 10*3/uL (ref 150.0–400.0)
RBC: 4.81 Mil/uL (ref 3.87–5.11)
RDW: 12.9 % (ref 11.5–15.5)
WBC: 7.5 10*3/uL (ref 4.0–10.5)

## 2016-04-19 LAB — BASIC METABOLIC PANEL
BUN: 20 mg/dL (ref 6–23)
CO2: 31 mEq/L (ref 19–32)
Calcium: 9 mg/dL (ref 8.4–10.5)
Chloride: 90 mEq/L — ABNORMAL LOW (ref 96–112)
Creatinine, Ser: 0.98 mg/dL (ref 0.40–1.20)
GFR: 58.53 mL/min — ABNORMAL LOW (ref >60.00)
Glucose, Bld: 93 mg/dL (ref 70–99)
Potassium: 3.8 mEq/L (ref 3.5–5.1)
Sodium: 128 mEq/L — ABNORMAL LOW (ref 135–145)

## 2016-04-19 LAB — TSH: TSH: 0.86 u[IU]/mL (ref 0.35–4.50)

## 2016-04-19 LAB — HEPATIC FUNCTION PANEL
ALT: 18 U/L (ref 0–35)
AST: 16 U/L (ref 0–37)
Albumin: 3.9 g/dL (ref 3.5–5.2)
Alkaline Phosphatase: 65 U/L (ref 39–117)
Bilirubin, Direct: 0.2 mg/dL (ref 0.0–0.3)
Total Bilirubin: 0.7 mg/dL (ref 0.2–1.2)
Total Protein: 6.2 g/dL (ref 6.0–8.3)

## 2016-04-22 ENCOUNTER — Telehealth: Payer: Self-pay | Admitting: Internal Medicine

## 2016-04-22 NOTE — Telephone Encounter (Signed)
[facility]  Patient Name: [name]  DOB: [birth year]    Initial Comment Caller has a cold. She has had green bloody mucus from nose.    Nurse Assessment  Nurse: Harlow Mares, RN, Suanne Marker Date/Time Eilene Ghazi Time): 04/22/2016 1:32:54 PM  Confirm and document reason for call. If symptomatic, describe symptoms. ---Caller has a cold. She has had green bloody mucus from nose. Symptoms began 4-5 days, reports low grade fever in the morning.  Does the patient have any new or worsening symptoms? ---Yes  Will a triage be completed? ---Yes  Related visit to physician within the last 2 weeks? ---No  Does the PT have any chronic conditions? (i.e. diabetes, asthma, etc.) ---Yes  List chronic conditions. ---HTN;  Is this a behavioral health or substance abuse call? ---No     Guidelines    Guideline Title Affirmed Question Affirmed Notes  Common Cold Cold with no complications (all triage questions negative)    Final Disposition User   [place], RN, Suanne Marker    Disagree/Comply: Comply

## 2016-04-22 NOTE — Telephone Encounter (Signed)
If not improving next week  OV

## 2016-04-22 NOTE — Telephone Encounter (Signed)
Home care given. Please follow up with patient if find symptoms warrant office visit or UC/ED.

## 2016-04-22 NOTE — Telephone Encounter (Signed)
Left a message for a return call on home number (preferred).

## 2016-04-25 NOTE — Progress Notes (Signed)
Pre visit review using our clinic review tool, if applicable. No additional management support is needed unless otherwise documented below in the visit note.  Chief Complaint  Patient presents with  . Medicare Wellness    HPI: [name] 75 y.o. comes in today for Preventive Medicare wellness visit  And Chronic disease management .Since last visit.   Uses the Tegretol lowest dose needed to suppress her facial pain trigeminal neuralgia. Battling recurrent headaches migraines. Was to start Effexor but hasn't started it yet. Usually relieved with Excedrin Migraine but has been told limited to 2 times a week. Her blood pressure is usually good and controlled. No syncope no recent injury see last fall tripped on an outside cord hose. 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 DEXA SCAN  07/14/2004  . TETANUS/TDAP  09/18/2021  . INFLUENZA VACCINE  Addressed  . ZOSTAVAX  Completed  . PNA vac Low Risk Adult  Completed   Health Maintenance Review LIFESTYLE:  TADno tob d some etoh Sugar beverages: n Sleep:7-8 hours     MEDICARE DOCUMENT QUESTIONS  TO SCAN     Hearing: ok some dec  Vision:  No limitations at present . Last eye check UTD  Safety:  Has smoke detector and wears seat belts.  No firearms. No excess sun exposure. Sees dentist regularly.  Falls: see past hx  No recnet falls tripped  On hose outside   Advance directive :  Reviewed  Has one.  Memory: Felt to be good  , no concern from her or her family.  Depression: No anhedonia unusual crying or depressive symptoms  Nutrition: Eats well balanced diet; adequate calcium and vitamin D. No swallowing chewing problems.  Injury:  No sew injury since last visit  i.  Other healthcare providers:  Reviewed today .  Social:  Lives with spouse married. .   Preventive parameters: up-to-date  Reviewed   ADLS:   There are no  problems or need for assistance  driving, feeding, obtaining food, dressing, toileting and bathing, managing money using phone. She is independent.   ROS: getting over a cols using saline irrigation uses symbicort in spr and fall GEN/ HEENT: No fever, significant weight changes sweats vision problems hearing changes, CV/ PULM; No chest pain shortness of breath cough, syncope,edema  change in exercise tolerance. GI /GU: No adominal pain, vomiting, change in bowel habits. No blood in the stool. No significant GU symptoms. Hx recurrent utis  None now  SKIN/HEME: ,no acute skin rashes suspicious lesions or bleeding. No lymphadenopathy, nodules, masses.  NEURO/ PSYCH:  No neurologic signs such as weakness numbness. No depression anxiety. Coping well with  Family issues  IMM/ Allergy: No unusual infections.  Allergy .   REST of 12 system review negative except as per HPI   Past Medical History:  Diagnosis Date  . Abnormal blood finding    elevated Hg and hct    . ADJ DISORDER WITH MIXED ANXIETY & DEPRESSED MOOD 03/03/2010   Qualifier: Diagnosis of  By: [name], [name]   . Closed head injury 02/01/2011   from syncope and had scalp laceration  neg ct .    Marland Kitchen Closed head injury 11/27/2015  . Gall stones 2016   see ct scan neg HIDA   . GERD (gastroesophageal reflux disease)   . Hearing aid worn   . Hyperlipidemia   .  Hypertension    echo nl lv function  mild dilitation 2009  . Medication side effect 09/02/2010   Poss muscle se of 10 crestor   . Migraine    hypnic HA eval by Dr. Earley Favor in the past  . Polycythemia   . 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 Syncope 02/01/2011   In shower on vacation  sustained head laceration  8 sutures Had ed visit neg head ct labs and x ray   . Trigeminal neuralgia pain     Family History  Problem Relation Age of Onset  . Ovarian cancer Mother   . Stroke Mother   . Alcohol abuse Father   . Stroke Father   . Seizures Daughter   . Hypertension    . Diabetes  Brother   . Cancer Paternal Aunt     leukemia, unknown type  . Colon cancer Neg Hx     Social History   Social History  . Marital status: Married    Spouse name: N/A  . Number of children: 2  . Years of education: N/A   Occupational History  .      retired Forensic psychologist   Social History Main Topics  . Smoking status: Never Smoker  . Smokeless tobacco: Never Used  . Alcohol use 1.2 oz/week    2 Glasses of wine per week  . Drug use: No  . Sexual activity: Not Asked   Other Topics Concern  . None   Social History Narrative   Married   HH of 2-3 (god daughter)   Pets 2 dogs   Non smoker    Child is a physician   G2P2                Outpatient Encounter Prescriptions as of 04/26/2016  Medication Sig  . albuterol (PROVENTIL HFA;VENTOLIN HFA) 108 (90 Base) MCG/ACT inhaler Inhale 2 puffs into the lungs every 6 (six) hours as needed.  Marland Kitchen amLODipine (NORVASC) 10 MG tablet TAKE ONE TABLET BY MOUTH ONCE DAILY  . ASCOMP-CODEINE 50-325-40-30 MG capsule TAKE 1-2 CAPSULES BY MOUTH EVERY FOUR TO SIX HOURS AS NEEDED FOR MIGRAINE MAX OF 6 CAPSULES IN 24 HOURS  . benazepril (LOTENSIN) 20 MG tablet TAKE ONE TABLET BY MOUTH ONCE DAILY  . budesonide-formoterol (SYMBICORT) 160-4.5 MCG/ACT inhaler Inhale 2 puffs into the lungs 2 (two) times daily. (Patient taking differently: Inhale 2 puffs into the lungs 2 (two) times daily. )  . chlorthalidone (HYGROTON) 25 MG tablet TAKE ONE TABLET BY MOUTH ONCE DAILY  . Cyanocobalamin (B-12 PO) Take by mouth.  . EPITOL 200 MG tablet TAKE TWO TABLETS BY MOUTH TWICE DAILY  . metoprolol (LOPRESSOR) 50 MG tablet TAKE ONE-HALF TABLET BY MOUTH TWICE DAILY  . PREMARIN vaginal cream Place 1 Applicatorful vaginally. 3 days/week  . [DISCONTINUED] albuterol (PROVENTIL HFA;VENTOLIN HFA) 108 (90 BASE) MCG/ACT inhaler Inhale 2 puffs into the lungs every 6 (six) hours as needed.  . venlafaxine XR (EFFEXOR XR) 37.5 MG 24 hr capsule Take 1 capsule (37.5 mg total)  by mouth daily with breakfast. (Patient not taking: Reported on 04/26/2016)  . [DISCONTINUED] carbamazepine (EPITOL) 200 MG tablet Take 2 tablets (400 mg total) by mouth 2 (two) times daily. Can increase to  1200 mg per day 300 tid as directed  . [DISCONTINUED] nortriptyline (PAMELOR) 50 MG capsule Take 1 capsule (50 mg total) by mouth at bedtime.   No facility-administered encounter medications on file as of 04/26/2016.  EXAM:  BP (!) 146/86 (BP Location: Right Arm, Patient Position: Sitting, Cuff Size: Normal)   Temp 97.8 F (36.6 C) (Oral)   Ht 5' 5.5" (1.664 m)   Wt 141 lb (64 kg)   BMI 23.11 kg/m   Body mass index is 23.11 kg/m.  Physical Exam: Vital signs reviewed ZOX:WRUE is a well-developed well-nourished alert cooperative   who appears stated age in no acute distress.  HEENT: normocephalic atraumatic , Eyes: PERRL EOM's full, conjunctiva clear, Nares: paten,t no deformity discharge or tenderness., Ears: no deformity EAC's clear TMs with normal landmarks. Mouth: clear OP, no lesions, edema.  Moist mucous membranes. Dentition in adequate repair. NECK: supple without masses, thyromegaly or bruits. CHEST/PULM:  Clear to auscultation and percussion breath sounds equal no wheeze , rales or rhonchi. No chest wall deformities or tenderness.Breast: normal by inspection . No dimpling, discharge, masses, tenderness or discharge . CV: PMI is nondisplaced, S1 S2 no gallops, murmurs, rubs. Peripheral pulses are full without delay.No JVD .  ABDOMEN: Bowel sounds normal nontender  No guard or rebound, no hepato splenomegal no CVA tenderness.   Extremtities:  No clubbing cyanosis or edema, no acute joint swelling or redness no focal atrophy NEURO:  Oriented x3, cranial nerves 3-12 appear to be intact, no obvious focal weakness,gait within normal limits no abnormal reflexes or asymmetrical SKIN: No acute rashes normal turgor, color, no bruising or petechiae. PSYCH: Oriented, good eye contact,  no obvious depression anxiety, cognition and judgment appear normal. LN: no cervical axillary inguinal adenopathy No noted deficits in memory, attention, and speech.   Lab Results  Component Value Date   WBC 7.5 04/19/2016   HGB 14.8 04/19/2016   HCT 43.5 04/19/2016   PLT 238.0 04/19/2016   GLUCOSE 93 04/19/2016   CHOL 234 (H) 04/19/2016   TRIG 59.0 04/19/2016   HDL 93.50 04/19/2016   LDLDIRECT 135.9 10/16/2012   LDLCALC 129 (H) 04/19/2016   ALT 18 04/19/2016   AST 16 04/19/2016   NA 128 (L) 04/19/2016   K 3.8 04/19/2016   CL 90 (L) 04/19/2016   CREATININE 0.98 04/19/2016   BUN 20 04/19/2016   CO2 31 04/19/2016   TSH 0.86 04/19/2016   INR 1.23 03/12/2015  Dictation #1 [id number]  [id number]  Lab reviewed  ASSESSMENT AND PLAN:  Discussed the following assessment and plan:  Visit for preventive health examination  Medicare annual wellness visit, subsequent  Essential hypertension  Medication management  Hyperlipidemia, unspecified hyperlipidemia type  Hyponatremia - Plan: Basic metabolic panel, Osmolality  Plan on repeating sodium and osmolality would not start the SNR eye until in a better range. If continued low we may have to stop her diuretic and try something else for hypertension although this is been the best control. Did discuss avoiding regular use of caffeine medicines to avoid rebound headaches and chronic daily headaches. Ok to refill albuterol to use as needed  Can add  flonase INCS for sinus congestion now Patient Care Team: [name], MD as PCP - General Cindie Crumbly (Neurosurgery) Irine Seal, MD as Attending Physician (Urology) Pieter Partridge, DO as Consulting Physician (Neurology)  Patient Instructions  Your sodium level in your blood stream was low which could be from the diuretic but could also be from other medicines. Repeat blood tests to make sure that it is returned closer to normal. If it continues to be low we may have to  change her blood pressure medicine again. Hold off on beginning the Effexor  because that could also drop the sodium level and I will send a message to Dr. Tomi Likens. I agree to not take Excedrin Migraine on a regular basis because you can get worse headaches and rebound. We'll plan follow-up depending on your sodium level.  You are up-to-date on yourrdap 2013.   Fall Prevention in the Home Falls can cause injuries and can affect people from all age groups. There are many simple things that you can do to make your home safe and to help prevent falls. What can I do on the outside of my home?  Regularly repair the edges of walkways and driveways and fix any cracks.  Remove high doorway thresholds.  Trim any shrubbery on the main path into your home.  Use bright outdoor lighting.  Clear walkways of debris and clutter, including tools and rocks.  Regularly check that handrails are securely fastened and in good repair. Both sides of any steps should have handrails.  Install guardrails along the edges of any raised decks or porches.  Have leaves, snow, and ice cleared regularly.  Use sand or salt on walkways during winter months.  In the garage, clean up any spills right away, including grease or oil spills. What can I do in the bathroom?  Use night lights.  Install grab bars by the toilet and in the tub and shower. Do not use towel bars as grab bars.  Use non-skid mats or decals on the floor of the tub or shower.  If you need to sit down while you are in the shower, use a plastic, non-slip stool.  Keep the floor dry. Immediately clean up any water that spills on the floor.  Remove soap buildup in the tub or shower on a regular basis.  Attach bath mats securely with double-sided non-slip rug tape.  Remove throw rugs and other tripping hazards from the floor. What can I do in the bedroom?  Use night lights.  Make sure that a bedside light is easy to reach.  Do not use  oversized bedding that drapes onto the floor.  Have a firm chair that has side arms to use for getting dressed.  Remove throw rugs and other tripping hazards from the floor. What can I do in the kitchen?  Clean up any spills right away.  Avoid walking on wet floors.  Place frequently used items in easy-to-reach places.  If you need to reach for something above you, use a sturdy step stool that has a grab bar.  Keep electrical cables out of the way.  Do not use floor polish or wax that makes floors slippery. If you have to use wax, make sure that it is non-skid floor wax.  Remove throw rugs and other tripping hazards from the floor. What can I do in the stairways?  Do not leave any items on the stairs.  Make sure that there are handrails on both sides of the stairs. Fix handrails that are broken or loose. Make sure that handrails are as long as the stairways.  Check any carpeting to make sure that it is firmly attached to the stairs. Fix any carpet that is loose or worn.  Avoid having throw rugs at the top or bottom of stairways, or secure the rugs with carpet tape to prevent them from moving.  Make sure that you have a light switch at the top of the stairs and the bottom of the stairs. If you do not have them, have them installed. What are some  other fall prevention tips?  Wear closed-toe shoes that fit well and support your feet. Wear shoes that have rubber soles or low heels.  When you use a stepladder, make sure that it is completely opened and that the sides are firmly locked. Have someone hold the ladder while you are using it. Do not climb a closed stepladder.  Add color or contrast paint or tape to grab bars and handrails in your home. Place contrasting color strips on the first and last steps.  Use mobility aids as needed, such as canes, walkers, scooters, and crutches.  Turn on lights if it is dark. Replace any light bulbs that burn out.  Set up furniture so that  there are clear paths. Keep the furniture in the same spot.  Fix any uneven floor surfaces.  Choose a carpet design that does not hide the edge of steps of a stairway.  Be aware of any and all pets.  Review your medicines with your healthcare provider. Some medicines can cause dizziness or changes in blood pressure, which increase your risk of falling. Talk with your health care provider about other ways that you can decrease your risk of falls. This may include working with a physical therapist or trainer to improve your strength, balance, and endurance. This information is not intended to replace advice given to you by your health care provider. Make sure you discuss any questions you have with your health care provider. Document Released: 04/29/2002 Document Revised: 10/06/2015 Document Reviewed: 06/13/2014 Elsevier Interactive Patient Education  2017 Dante K. Eswin Worrell M.D.

## 2016-04-26 ENCOUNTER — Ambulatory Visit (INDEPENDENT_AMBULATORY_CARE_PROVIDER_SITE_OTHER): Payer: Medicare Other | Admitting: Internal Medicine

## 2016-04-26 ENCOUNTER — Encounter: Payer: Self-pay | Admitting: Internal Medicine

## 2016-04-26 VITALS — BP 146/86 | Temp 97.8°F | Ht 65.5 in | Wt 141.0 lb

## 2016-04-26 DIAGNOSIS — E785 Hyperlipidemia, unspecified: Secondary | ICD-10-CM | POA: Diagnosis not present

## 2016-04-26 DIAGNOSIS — Z79899 Other long term (current) drug therapy: Secondary | ICD-10-CM

## 2016-04-26 DIAGNOSIS — E871 Hypo-osmolality and hyponatremia: Secondary | ICD-10-CM

## 2016-04-26 DIAGNOSIS — I10 Essential (primary) hypertension: Secondary | ICD-10-CM | POA: Diagnosis not present

## 2016-04-26 DIAGNOSIS — Z0000 Encounter for general adult medical examination without abnormal findings: Secondary | ICD-10-CM

## 2016-04-26 MED ORDER — ALBUTEROL SULFATE HFA 108 (90 BASE) MCG/ACT IN AERS: 2.0000 | INHALATION_SPRAY | Freq: Four times a day (QID) | RESPIRATORY_TRACT | 2 refills | Status: DC | PRN

## 2016-04-26 NOTE — Patient Instructions (Addendum)
Your sodium level in your blood stream was low which could be from the diuretic but could also be from other medicines. Repeat blood tests to make sure that it is returned closer to normal. If it continues to be low we may have to change her blood pressure medicine again. Hold off on beginning the Effexor because that could also drop the sodium level and I will send a message to Dr. Tomi Likens. I agree to not take Excedrin Migraine on a regular basis because you can get worse headaches and rebound. We'll plan follow-up depending on your sodium level.  You are up-to-date on yourrdap 2013.   Fall Prevention in the Home Falls can cause injuries and can affect people from all age groups. There are many simple things that you can do to make your home safe and to help prevent falls. What can I do on the outside of my home?  Regularly repair the edges of walkways and driveways and fix any cracks.  Remove high doorway thresholds.  Trim any shrubbery on the main path into your home.  Use bright outdoor lighting.  Clear walkways of debris and clutter, including tools and rocks.  Regularly check that handrails are securely fastened and in good repair. Both sides of any steps should have handrails.  Install guardrails along the edges of any raised decks or porches.  Have leaves, snow, and ice cleared regularly.  Use sand or salt on walkways during winter months.  In the garage, clean up any spills right away, including grease or oil spills. What can I do in the bathroom?  Use night lights.  Install grab bars by the toilet and in the tub and shower. Do not use towel bars as grab bars.  Use non-skid mats or decals on the floor of the tub or shower.  If you need to sit down while you are in the shower, use a plastic, non-slip stool.  Keep the floor dry. Immediately clean up any water that spills on the floor.  Remove soap buildup in the tub or shower on a regular basis.  Attach bath mats  securely with double-sided non-slip rug tape.  Remove throw rugs and other tripping hazards from the floor. What can I do in the bedroom?  Use night lights.  Make sure that a bedside light is easy to reach.  Do not use oversized bedding that drapes onto the floor.  Have a firm chair that has side arms to use for getting dressed.  Remove throw rugs and other tripping hazards from the floor. What can I do in the kitchen?  Clean up any spills right away.  Avoid walking on wet floors.  Place frequently used items in easy-to-reach places.  If you need to reach for something above you, use a sturdy step stool that has a grab bar.  Keep electrical cables out of the way.  Do not use floor polish or wax that makes floors slippery. If you have to use wax, make sure that it is non-skid floor wax.  Remove throw rugs and other tripping hazards from the floor. What can I do in the stairways?  Do not leave any items on the stairs.  Make sure that there are handrails on both sides of the stairs. Fix handrails that are broken or loose. Make sure that handrails are as long as the stairways.  Check any carpeting to make sure that it is firmly attached to the stairs. Fix any carpet that is loose or worn.  Avoid having throw rugs at the top or bottom of stairways, or secure the rugs with carpet tape to prevent them from moving.  Make sure that you have a light switch at the top of the stairs and the bottom of the stairs. If you do not have them, have them installed. What are some other fall prevention tips?  Wear closed-toe shoes that fit well and support your feet. Wear shoes that have rubber soles or low heels.  When you use a stepladder, make sure that it is completely opened and that the sides are firmly locked. Have someone hold the ladder while you are using it. Do not climb a closed stepladder.  Add color or contrast paint or tape to grab bars and handrails in your home. Place  contrasting color strips on the first and last steps.  Use mobility aids as needed, such as canes, walkers, scooters, and crutches.  Turn on lights if it is dark. Replace any light bulbs that burn out.  Set up furniture so that there are clear paths. Keep the furniture in the same spot.  Fix any uneven floor surfaces.  Choose a carpet design that does not hide the edge of steps of a stairway.  Be aware of any and all pets.  Review your medicines with your healthcare provider. Some medicines can cause dizziness or changes in blood pressure, which increase your risk of falling. Talk with your health care provider about other ways that you can decrease your risk of falls. This may include working with a physical therapist or trainer to improve your strength, balance, and endurance. This information is not intended to replace advice given to you by your health care provider. Make sure you discuss any questions you have with your health care provider. Document Released: 04/29/2002 Document Revised: 10/06/2015 Document Reviewed: 06/13/2014 Elsevier Interactive Patient Education  2017 [employer].

## 2016-04-26 NOTE — Telephone Encounter (Signed)
Pt seen in [place] today.

## 2016-04-27 ENCOUNTER — Other Ambulatory Visit (INDEPENDENT_AMBULATORY_CARE_PROVIDER_SITE_OTHER): Payer: Medicare Other

## 2016-04-27 DIAGNOSIS — E871 Hypo-osmolality and hyponatremia: Secondary | ICD-10-CM

## 2016-04-27 LAB — BASIC METABOLIC PANEL
BUN: 13 mg/dL (ref 6–23)
CO2: 33 mEq/L — ABNORMAL HIGH (ref 19–32)
Calcium: 9.4 mg/dL (ref 8.4–10.5)
Chloride: 91 mEq/L — ABNORMAL LOW (ref 96–112)
Creatinine, Ser: 0.76 mg/dL (ref 0.40–1.20)
GFR: 78.48 mL/min (ref >60.00)
Glucose, Bld: 133 mg/dL — ABNORMAL HIGH (ref 70–99)
Potassium: 3.7 mEq/L (ref 3.5–5.1)
Sodium: 130 mEq/L — ABNORMAL LOW (ref 135–145)

## 2016-04-28 LAB — OSMOLALITY: Osmolality: 278 mOsm/kg (ref 278–305)

## 2016-04-29 ENCOUNTER — Encounter: Payer: Self-pay | Admitting: Internal Medicine

## 2016-05-01 ENCOUNTER — Other Ambulatory Visit: Payer: Self-pay | Admitting: Internal Medicine

## 2016-05-03 NOTE — Telephone Encounter (Signed)
Sent to the pharmacy by e-scribe for 6 months.  Seen on 04/26/16 and advised to return for follow up in six months.

## 2016-05-04 ENCOUNTER — Other Ambulatory Visit: Payer: Self-pay | Admitting: Internal Medicine

## 2016-05-05 NOTE — Telephone Encounter (Signed)
Sent to the pharmacy by e-scribe for 6 months.  Pt to return in 6 months.

## 2016-06-01 ENCOUNTER — Encounter: Payer: Self-pay | Admitting: Internal Medicine

## 2016-06-01 ENCOUNTER — Other Ambulatory Visit: Payer: Self-pay | Admitting: Internal Medicine

## 2016-06-01 NOTE — Telephone Encounter (Signed)
Sent to the pharmacy by e-scribe.  Pt due back in June.

## 2016-06-07 ENCOUNTER — Other Ambulatory Visit: Payer: Self-pay | Admitting: Internal Medicine

## 2016-06-10 NOTE — Telephone Encounter (Signed)
Sent to the pharmacy by e-scribe. 

## 2016-06-11 ENCOUNTER — Other Ambulatory Visit: Payer: Self-pay | Admitting: Internal Medicine

## 2016-06-13 ENCOUNTER — Encounter: Payer: Self-pay | Admitting: Internal Medicine

## 2016-06-15 ENCOUNTER — Encounter: Payer: Self-pay | Admitting: Internal Medicine

## 2016-06-15 NOTE — Telephone Encounter (Signed)
Sent to the pharmacy by e-scribe. 

## 2016-06-15 NOTE — Telephone Encounter (Signed)
Sorry about the delay    No guarantee that  The  meds will help your headaches   And I agree there is a risk of lower sodium   If you begin would have to recheck the sodium  If the headaches are not life threatening then this is a management problem .  I can send  No message to dr Tomi Likens to see if has other ideas.  (I'v e seen some patients be helped with magnesium supplements. )    I think that some caffeine isnt the end of the world but rebound headaches     From such as Excedrin can be a problem ( then you have 2 types of headaches)   Also want to avoid narcotics cause although they are rescue for pain they can cause headaches!

## 2016-06-16 DIAGNOSIS — Z1231 Encounter for screening mammogram for malignant neoplasm of breast: Secondary | ICD-10-CM | POA: Diagnosis not present

## 2016-06-16 LAB — HM MAMMOGRAPHY

## 2016-06-22 ENCOUNTER — Encounter: Payer: Self-pay | Admitting: Internal Medicine

## 2016-06-22 ENCOUNTER — Encounter: Payer: Self-pay | Admitting: Family Medicine

## 2016-06-23 ENCOUNTER — Other Ambulatory Visit: Payer: Self-pay | Admitting: Family Medicine

## 2016-06-23 DIAGNOSIS — Z79899 Other long term (current) drug therapy: Secondary | ICD-10-CM

## 2016-06-23 DIAGNOSIS — E871 Hypo-osmolality and hyponatremia: Secondary | ICD-10-CM

## 2016-06-26 NOTE — Telephone Encounter (Signed)
t  Dr Tomi Likens  Input would be to  change the metoprolol  To  A similar medicine that may be better for headache suppression   And shouldn't  effect the sodium level .    Stop the metoprolol and begin atenolol 50 mg  1 po wd for 90 days   Misty send in if patient agrees  Then ROV   In 2 months  Continue to monitor headaches and follow up with dr Tomi Likens also as appropriate   Misty please inform patient   And can also send this my char t?

## 2016-06-27 ENCOUNTER — Other Ambulatory Visit (INDEPENDENT_AMBULATORY_CARE_PROVIDER_SITE_OTHER): Payer: Medicare Other

## 2016-06-27 ENCOUNTER — Encounter: Payer: Self-pay | Admitting: Internal Medicine

## 2016-06-27 DIAGNOSIS — Z79899 Other long term (current) drug therapy: Secondary | ICD-10-CM

## 2016-06-27 DIAGNOSIS — E871 Hypo-osmolality and hyponatremia: Secondary | ICD-10-CM | POA: Diagnosis not present

## 2016-06-27 LAB — BASIC METABOLIC PANEL
BUN: 19 mg/dL (ref 6–23)
CO2: 32 mEq/L (ref 19–32)
Calcium: 9.3 mg/dL (ref 8.4–10.5)
Chloride: 96 mEq/L (ref 96–112)
Creatinine, Ser: 0.82 mg/dL (ref 0.40–1.20)
GFR: 71.86 mL/min (ref >60.00)
Glucose, Bld: 95 mg/dL (ref 70–99)
Potassium: 3.9 mEq/L (ref 3.5–5.1)
Sodium: 133 mEq/L — ABNORMAL LOW (ref 135–145)

## 2016-07-01 MED ORDER — ATENOLOL 50 MG PO TABS: 50.0000 mg | ORAL_TABLET | Freq: Every day | ORAL | 0 refills | Status: DC

## 2016-07-07 DIAGNOSIS — H5213 Myopia, bilateral: Secondary | ICD-10-CM | POA: Diagnosis not present

## 2016-07-21 ENCOUNTER — Encounter: Payer: Self-pay | Admitting: Neurology

## 2016-07-21 ENCOUNTER — Ambulatory Visit (INDEPENDENT_AMBULATORY_CARE_PROVIDER_SITE_OTHER): Payer: Medicare Other | Admitting: Neurology

## 2016-07-21 VITALS — BP 118/68 | HR 58 | Temp 97.8°F | Resp 18 | Ht 65.75 in | Wt 143.9 lb

## 2016-07-21 DIAGNOSIS — G500 Trigeminal neuralgia: Secondary | ICD-10-CM | POA: Diagnosis not present

## 2016-07-21 DIAGNOSIS — R51 Headache: Secondary | ICD-10-CM | POA: Diagnosis not present

## 2016-07-21 DIAGNOSIS — R519 Headache, unspecified: Secondary | ICD-10-CM

## 2016-07-21 NOTE — Progress Notes (Signed)
NEUROLOGY FOLLOW UP OFFICE NOTE  [name] [id number]  HISTORY OF PRESENT ILLNESS: [name] is a 76 year old right-handed female with hypertension, hyperlipidemia, trigeminal neuralgia, migraine and GERD who follows up for right-sided unilateral headache and right sided trigeminal neuralgia.   UPDATE: Trigeminal neuralgia is stable.  Headaches are improved on atenolol Intensity:  6/10 Duration:  Until she drinks a cup of coffee and lays down Frequency:  Every 3 to 4 days. Current abortive medication:  cup of coffee. Antihypertensive medications:  metoprolol 87m twice daily, amlodipine 147m benzapril Antidepressant medications: no Anticonvulsant medications:  topiramate 1007mEpitol (carbamazepine) 400m47mice daily (for trigeminal neuralgia) Vitamins/Herbal/Supplements:  Melatonin 10mg47mly Other therapy:  none Reports stress and anxiety.  Contributes to poor sleep.   Her Na levels had been running low.  It was 134 in December 2016.  It was 128 in November, but has been stabilizing (130 in December and 133 in February).  HISTORY: Onset:  She fell and hit the right front of her head about a year ago.  About 5 months later, she developed current headache Location:  Right frontal/maxillary/retro-orbital Quality:  Throbbing, pounding Initial Intensity:  8/10 Aura:  no Prodrome:  no Associated symptoms:  No nausea, photophobia, phonophobia Initial Duration:  She wakes up with them and it usually resolves within one hour with Excedrin Initial Frequency:  2 to 3 days per week Triggers/exacerbating factors:  none Relieving factors:  none Activity:  Unable to function   Past abortive medication:  Excedrin, butalbital-caffeine-codeine Past preventative medication:  gabapentin (unable to tolerate), nortriptyline (did not tolerate), venlafaxine XR, topiramate Other past therapy:  none   She had an MRI of the brain with and without contrast on 12/07/11 to evaluate  etiology of trigeminal neuralgia.  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    Sed Rate was 2.   She has history of migraines and tension type headaches since childhood.  Typical migraines associated with visual aura, numbness and tingling of arms, and nausea.     She also has right sided trigeminal neuralgia, for which she takes Epitol.  [facility] Past Medical History:  Diagnosis Date  . Abnormal blood finding    elevated Hg and hct    . ADJ DISORDER WITH MIXED ANXIETY & DEPRESSED MOOD 03/03/2010   Qualifier: Diagnosis of  By: PanosRegis BillWandaStandley BrookingClosed head injury 02/01/2011   from syncope and had scalp laceration  neg ct .    . CloMarland Kitchened head injury 11/27/2015  . Gall stones 2016   see ct scan neg HIDA   . GERD (gastroesophageal reflux disease)   . Hearing aid worn   . Hyperlipidemia   . Hypertension    echo nl lv function  mild dilitation 2009  . Medication side effect 09/02/2010   Poss muscle se of 10 crestor   . Migraine    hypnic HA eval by Dr. AdelmEarley Favorhe past  . Polycythemia   . 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 Syncope 02/01/2011   In shower on vacation  sustained head laceration  8 sutures Had ed visit neg head ct labs and x ray   . Trigeminal neuralgia pain     MEDICATIONS: Current Outpatient Prescriptions on File Prior to Visit  Medication Sig Dispense Refill  . albuterol (PROVENTIL HFA;VENTOLIN HFA)  108 (90 Base) MCG/ACT inhaler Inhale 2 puffs into the lungs every 6 (six) hours as needed. 1 Inhaler 2  . amLODipine (NORVASC) 10 MG tablet TAKE ONE TABLET BY MOUTH ONCE DAILY 90 tablet 0  . ASCOMP-CODEINE 50-325-40-30 MG capsule TAKE 1-2 CAPSULES BY MOUTH EVERY FOUR TO SIX HOURS AS NEEDED FOR MIGRAINE MAX OF 6 CAPSULES IN 24 HOURS 30 capsule 0  . atenolol (TENORMIN) 50 MG tablet Take 1 tablet (50 mg total) by mouth daily. 90  tablet 0  . benazepril (LOTENSIN) 20 MG tablet TAKE ONE TABLET BY MOUTH ONCE DAILY 90 tablet 1  . budesonide-formoterol (SYMBICORT) 160-4.5 MCG/ACT inhaler Inhale 2 puffs into the lungs 2 (two) times daily. (Patient taking differently: Inhale 2 puffs into the lungs 2 (two) times daily. ) 1 Inhaler 2  . chlorthalidone (HYGROTON) 25 MG tablet TAKE ONE TABLET BY MOUTH ONCE DAILY 90 tablet 1  . Cyanocobalamin (B-12 PO) Take by mouth.    . EPITOL 200 MG tablet TAKE TWO TABLETS BY MOUTH TWICE DAILY 360 tablet 1  . PREMARIN vaginal cream Place 1 Applicatorful vaginally. 3 days/week  0  . venlafaxine XR (EFFEXOR XR) 37.5 MG 24 hr capsule Take 1 capsule (37.5 mg total) by mouth daily with breakfast. (Patient not taking: Reported on 07/21/2016) 30 capsule 0   No current facility-administered medications on file prior to visit.     ALLERGIES: Allergies  Allergen Reactions  . Hydrocodone Other (See Comments)    Rebound headaches  . Oxycodone Other (See Comments)    Rebound headaches  . Sulfamethoxazole-Trimethoprim     REACTION: unspecified    FAMILY HISTORY: Family History  Problem Relation Age of Onset  . Ovarian cancer Mother   . Stroke Mother   . Alcohol abuse Father   . Stroke Father   . Seizures Daughter   . Hypertension    . Diabetes Brother   . Cancer Paternal Aunt     leukemia, unknown type  . Colon cancer Neg Hx     SOCIAL HISTORY: Social History   Social History  . Marital status: Married    Spouse name: N/A  . Number of children: 2  . Years of education: N/A   Occupational History  .      retired Forensic psychologist   Social History Main Topics  . Smoking status: Never Smoker  . Smokeless tobacco: Never Used  . Alcohol use 1.2 oz/week    2 Glasses of wine per week  . Drug use: No  . Sexual activity: Not on file   Other Topics Concern  . Not on file   Social History Narrative   Married   [place] of 2-3 (god daughter)   Pets 2 dogs   Non smoker    Child is a  physician   G2P2                REVIEW OF SYSTEMS: Constitutional: No fevers, chills, or sweats, no generalized fatigue, change in appetite Eyes: No visual changes, double vision, eye pain Ear, nose and throat: No hearing loss, ear pain, nasal congestion, sore throat Cardiovascular: No chest pain, palpitations Respiratory:  No shortness of breath at rest or with exertion, wheezes GastrointestinaI: No nausea, vomiting, diarrhea, abdominal pain, fecal incontinence Genitourinary:  No dysuria, urinary retention or frequency Musculoskeletal:  No neck pain, back pain Integumentary: No rash, pruritus, skin lesions Neurological: as above Psychiatric: No depression, insomnia, anxiety Endocrine: No palpitations, fatigue, diaphoresis, mood swings, change in appetite,  change in weight, increased thirst Hematologic/Lymphatic:  No purpura, petechiae. Allergic/Immunologic: no itchy/runny eyes, nasal congestion, recent allergic reactions, rashes  PHYSICAL EXAM: Vitals:   07/21/16 0908  BP: 118/68  Pulse: (!) 58  Resp: 18  Temp: 97.8 F (36.6 C)   General: No acute distress.  Patient appears well-groomed.  normal body habitus. Head:  Normocephalic/atraumatic Eyes:  Fundi examined but not visualized Neck: supple, no paraspinal tenderness, full range of motion Heart:  Regular rate and rhythm Lungs:  Clear to auscultation bilaterally Back: No paraspinal tenderness Neurological Exam: alert and oriented to person, place, and time. Attention span and concentration intact, recent and remote memory intact, fund of knowledge intact.  Speech fluent and not dysarthric, language intact.  CN II-XII intact. Bulk and tone normal, muscle strength 5/5 throughout.  Sensation to light touch, temperature and vibration intact.  Deep tendon reflexes 2+ throughout, toes downgoing.  Finger to nose and heel to shin testing intact.  Gait normal, Romberg negative.  IMPRESSION: Right-sided headache, probable  migraine Right sided trigeminal neuralgia  PLAN: 1.  Continue atenolol 36m daily and Epitol 2.  Try to increase water intake.  Continue exercise.  Continue sleep exercises 3.  Follow up with Dr. PRegis Billregarding congestion and wheezing (O2 Sat was 94%). 4.  Follow up in 5 months.  AMetta Clines DO  CC: WShanon Ace MD

## 2016-07-21 NOTE — Patient Instructions (Addendum)
1.  Continue atenolol 55m daily and Epitol 2.  Try to increase water intake.  Continue exercise.  Continue sleep exercises 3.  Follow up with Dr. PRegis Billregarding your congestion and wheezing 4.  Follow up in 5 months.

## 2016-08-28 ENCOUNTER — Other Ambulatory Visit: Payer: Self-pay | Admitting: Internal Medicine

## 2016-09-06 ENCOUNTER — Ambulatory Visit: Payer: Medicare Other | Admitting: Internal Medicine

## 2016-09-10 ENCOUNTER — Other Ambulatory Visit: Payer: Self-pay | Admitting: Internal Medicine

## 2016-09-13 ENCOUNTER — Ambulatory Visit (INDEPENDENT_AMBULATORY_CARE_PROVIDER_SITE_OTHER): Payer: Medicare Other | Admitting: Internal Medicine

## 2016-09-13 ENCOUNTER — Encounter: Payer: Self-pay | Admitting: Internal Medicine

## 2016-09-13 VITALS — BP 120/80 | HR 61 | Temp 97.6°F | Ht 65.75 in | Wt 144.8 lb

## 2016-09-13 DIAGNOSIS — I10 Essential (primary) hypertension: Secondary | ICD-10-CM | POA: Diagnosis not present

## 2016-09-13 DIAGNOSIS — Z79899 Other long term (current) drug therapy: Secondary | ICD-10-CM

## 2016-09-13 DIAGNOSIS — R51 Headache: Secondary | ICD-10-CM

## 2016-09-13 DIAGNOSIS — G8929 Other chronic pain: Secondary | ICD-10-CM

## 2016-09-13 MED ORDER — ATENOLOL 50 MG PO TABS: 50.0000 mg | ORAL_TABLET | Freq: Two times a day (BID) | ORAL | 1 refills | Status: DC

## 2016-09-13 NOTE — Progress Notes (Signed)
Chief Complaint  Patient presents with  . Follow-up    HPI: [name] 76 y.o. come in for Chronic disease management   Basically  Fu HT and haeadache suppression rx  Directed by Dr Loretta Plume. Has had hyponatremia assymptomatic   heaches still happen taking 50 of atenol and 25 bid of metoprolol.  No se noted  But is always tired    Feet swell at end of day 'has lots of stress wioth family and  Never sleeps well.   No os in past. ocass takes  otc no help .    IMPRESSION: Right-sided headache, probable migraine Right sided trigeminal neuralgia  PLAN: 1.  Continue atenolol 21m daily and Epitol 2.  Try to increase water intake.  Continue exercise.  Continue sleep exercises 3.  Follow up with Dr. PRegis Billregarding congestion and wheezing (O2 Sat was 94%). 4.  Follow up in 5 months.  AMetta Clines DO ROS: See pertinent positives and negatives per HPI.  Past Medical History:  Diagnosis Date  . Abnormal blood finding    elevated Hg and hct    . ADJ DISORDER WITH MIXED ANXIETY & DEPRESSED MOOD 03/03/2010   Qualifier: Diagnosis of  By: PRegis BillMD, WStandley Barrera  . Closed head injury 02/01/2011   from syncope and had scalp laceration  neg ct .    .Marland KitchenClosed head injury 11/27/2015  . Gall stones 2016   see ct scan neg HIDA   . GERD (gastroesophageal reflux disease)   . Hearing aid worn   . Hyperlipidemia   . Hypertension    echo nl lv function  mild dilitation 2009  . Medication side effect 09/02/2010   Poss muscle se of 10 crestor   . Migraine    hypnic HA eval by Dr. AEarley Favorin the past  . Polycythemia   . 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 Syncope 02/01/2011   In shower on vacation  sustained head laceration  8 sutures Had ed visit neg head ct labs and x ray   . Trigeminal neuralgia pain     Family History  Problem Relation Age of Onset  . Ovarian cancer Mother   . Stroke Mother   . Alcohol abuse Father   . Stroke Father   . Seizures Daughter   . Hypertension    .  Diabetes Brother   . Cancer Paternal Aunt     leukemia, unknown type  . Colon cancer Neg Hx     Social History   Social History  . Marital status: Married    Spouse name: N/A  . Number of children: 2  . Years of education: N/A   Occupational History  .      retired rForensic psychologist  Social History Main Topics  . Smoking status: Never Smoker  . Smokeless tobacco: Never Used  . Alcohol use 1.2 oz/week    2 Glasses of wine per week  . Drug use: No  . Sexual activity: Not Asked   Other Topics Concern  . None   Social History Narrative   Married   HH of 2-3 (god daughter)   Pets 2 dogs   Non smoker    Child is a physician   G2P2                Outpatient Medications Prior to Visit  Medication Sig Dispense Refill  . albuterol (PROVENTIL HFA;VENTOLIN HFA) 108 (90 Base) MCG/ACT inhaler Inhale 2  puffs into the lungs every 6 (six) hours as needed. 1 Inhaler 2  . amLODipine (NORVASC) 10 MG tablet TAKE ONE TABLET BY MOUTH ONCE DAILY 90 tablet 1  . ASCOMP-CODEINE 50-325-40-30 MG capsule TAKE 1-2 CAPSULES BY MOUTH EVERY FOUR TO SIX HOURS AS NEEDED FOR MIGRAINE MAX OF 6 CAPSULES IN 24 HOURS 30 capsule 0  . benazepril (LOTENSIN) 20 MG tablet TAKE ONE TABLET BY MOUTH ONCE DAILY 90 tablet 1  . budesonide-formoterol (SYMBICORT) 160-4.5 MCG/ACT inhaler Inhale 2 puffs into the lungs 2 (two) times daily. (Patient taking differently: Inhale 2 puffs into the lungs 2 (two) times daily. ) 1 Inhaler 2  . chlorthalidone (HYGROTON) 25 MG tablet TAKE ONE TABLET BY MOUTH ONCE DAILY 90 tablet 1  . Cyanocobalamin (B-12 PO) Take by mouth.    . EPITOL 200 MG tablet TAKE TWO TABLETS BY MOUTH TWICE DAILY 360 tablet 1  . PREMARIN vaginal cream Place 1 Applicatorful vaginally. 3 days/week  0  . RESTASIS 0.05 % ophthalmic emulsion Place 0.05 drops into both eyes 2 (two) times daily.  1  . atenolol (TENORMIN) 50 MG tablet Take 1 tablet (50 mg total) by mouth daily. 90 tablet 0  . metoprolol  (LOPRESSOR) 50 MG tablet Take 50 mg by mouth 2 (two) times daily.  0  . amLODipine (NORVASC) 10 MG tablet TAKE ONE TABLET BY MOUTH ONCE DAILY 90 tablet 0  . venlafaxine XR (EFFEXOR XR) 37.5 MG 24 hr capsule Take 1 capsule (37.5 mg total) by mouth daily with breakfast. (Patient not taking: Reported on 07/21/2016) 30 capsule 0   No facility-administered medications prior to visit.      EXAM:  BP 120/80 (BP Location: Right Arm, Patient Position: Sitting, Cuff Size: Normal)   Pulse 61   Temp 97.6 F (36.4 C) (Oral)   Ht 5' 5.75" (1.67 m)   Wt 144 lb 12.8 oz (65.7 kg)   BMI 23.55 kg/m   Body mass index is 23.55 kg/m.  GENERAL: vitals reviewed and listed above, alert, oriented, appears well hydrated and in no acute distress HEENT: atraumatic, conjunctiva  clear, no obvious abnormalities on inspection of external nose and earsMS: moves all extremities without noticeable focal  Abnormality feet are puffy  No lesion or warmth.  PSYCH: pleasant and cooperative, no obvious depression or anxiety Lab Results  Component Value Date   WBC 7.5 04/19/2016   HGB 14.8 04/19/2016   HCT 43.5 04/19/2016   PLT 238.0 04/19/2016   GLUCOSE 95 06/27/2016   CHOL 234 (H) 04/19/2016   TRIG 59.0 04/19/2016   HDL 93.50 04/19/2016   LDLDIRECT 135.9 10/16/2012   LDLCALC 129 (H) 04/19/2016   ALT 18 04/19/2016   AST 16 04/19/2016   NA 133 (L) 06/27/2016   K 3.9 06/27/2016   CL 96 06/27/2016   CREATININE 0.82 06/27/2016   BUN 19 06/27/2016   CO2 32 06/27/2016   TSH 0.86 04/19/2016   INR 1.23 03/12/2015   BP Readings from Last 3 Encounters:  09/13/16 120/80  07/21/16 118/68  04/26/16 (!) 146/86   Wt Readings from Last 3 Encounters:  09/13/16 144 lb 12.8 oz (65.7 kg)  07/21/16 143 lb 14.4 oz (65.3 kg)  04/26/16 141 lb (64 kg)     ASSESSMENT AND PLAN:  Discussed the following assessment and plan:  Essential hypertension  Medication management  Chronic recurrent am  nonintractable headache,   At this time she is on 2 beta blockers which may be confusing and would be  best to be on one or the other. Since atenolol was given more for the headache suppression we'll have her stop the metoprolol equivalent to 50 mg a day and increase the atenolol to 50 twice a day. If her blood pressures to lower her pulse is too low we will back off on this. She will send  [place] bp reading in a few months  We'll review her records and if blood pressure control is good  consider backing off on the amlodipine also in this is cases adding to her fatigue and swelling. Her sleep has always been problematic and   She can look into  belsomra   As an optns but still may have risk  Showed me supplements of friends advised    For energy clean out and other   Read the ingredients and mostly they are high-dose medicines minerals supplements of various sorts. Told her I cannot tell her there is credible evidence of a health and cannot tell her arm although she did take some and felt dizzy. Would have her not take these at this time.  -Patient advised to return or notify health care team  if  new concerns arise.  Patient Instructions  consider looking at otc   Compression socks   Up to 15 -20  Mm of mercury  When  Up and around.  Legs are down .   Change the atenolol  To   50 mg  twice a day .    And stop the metoprolol .    After a month  Check  bp at home   And let us know how doing.   November  december.  yearly .          [name]. [name] M.D.

## 2016-09-13 NOTE — Patient Instructions (Addendum)
consider looking at otc   Compression socks   Up to 15 -20  Mm of mercury  When  Up and around.  Legs are down .   Change the atenolol  To   50 mg  twice a day .    And stop the metoprolol .    After a month  Check  bp at home   And let us know how doing.   November  december.  yearly .

## 2016-11-06 ENCOUNTER — Other Ambulatory Visit: Payer: Self-pay | Admitting: Internal Medicine

## 2016-11-09 ENCOUNTER — Encounter: Payer: Self-pay | Admitting: Internal Medicine

## 2016-11-10 ENCOUNTER — Encounter: Payer: Self-pay | Admitting: Internal Medicine

## 2016-11-10 MED ORDER — MEBENDAZOLE 100 MG PO CHEW: 100.0000 mg | CHEWABLE_TABLET | Freq: Once | ORAL | 0 refills | Status: AC

## 2016-11-11 ENCOUNTER — Encounter: Payer: Self-pay | Admitting: Family Medicine

## 2016-11-15 NOTE — Telephone Encounter (Signed)
error 

## 2016-11-21 ENCOUNTER — Encounter: Payer: Self-pay | Admitting: Internal Medicine

## 2016-11-25 ENCOUNTER — Encounter: Payer: Self-pay | Admitting: Internal Medicine

## 2016-11-28 MED ORDER — METOPROLOL TARTRATE 50 MG PO TABS: 25.0000 mg | ORAL_TABLET | Freq: Two times a day (BID) | ORAL | 1 refills | Status: DC

## 2016-11-28 NOTE — Telephone Encounter (Signed)
We can do a straight switch  To   Metoprolol 50 mg  Take 1/2  Twice a day ( s5 mg)  If needed we can decrease this dose also .   I will send in to your pharmacy [facility]

## 2016-12-21 DIAGNOSIS — R8271 Bacteriuria: Secondary | ICD-10-CM | POA: Diagnosis not present

## 2016-12-26 ENCOUNTER — Ambulatory Visit: Payer: Medicare Other | Admitting: Neurology

## 2016-12-30 ENCOUNTER — Encounter: Payer: Self-pay | Admitting: Internal Medicine

## 2017-01-30 ENCOUNTER — Other Ambulatory Visit: Payer: Self-pay | Admitting: Internal Medicine

## 2017-01-30 ENCOUNTER — Ambulatory Visit (INDEPENDENT_AMBULATORY_CARE_PROVIDER_SITE_OTHER): Payer: Medicare Other | Admitting: Internal Medicine

## 2017-01-30 ENCOUNTER — Encounter: Payer: Self-pay | Admitting: Internal Medicine

## 2017-01-30 VITALS — BP 120/90 | HR 56 | Temp 97.7°F | Wt 143.2 lb

## 2017-01-30 DIAGNOSIS — R110 Nausea: Secondary | ICD-10-CM

## 2017-01-30 DIAGNOSIS — R300 Dysuria: Secondary | ICD-10-CM | POA: Diagnosis not present

## 2017-01-30 DIAGNOSIS — R1011 Right upper quadrant pain: Secondary | ICD-10-CM

## 2017-01-30 DIAGNOSIS — Z87440 Personal history of urinary (tract) infections: Secondary | ICD-10-CM | POA: Diagnosis not present

## 2017-01-30 LAB — POC URINALSYSI DIPSTICK (AUTOMATED)
Bilirubin, UA: NEGATIVE
Blood, UA: NEGATIVE
Glucose, UA: NEGATIVE
Ketones, UA: NEGATIVE
Leukocytes, UA: NEGATIVE
Nitrite, UA: NEGATIVE
Protein, UA: NEGATIVE
Spec Grav, UA: 1.025 (ref 1.010–1.025)
Urobilinogen, UA: 0.2 E.U./dL
pH, UA: 6 (ref 5.0–8.0)

## 2017-01-30 MED ORDER — CEFUROXIME AXETIL 500 MG PO TABS: 500.0000 mg | ORAL_TABLET | Freq: Two times a day (BID) | ORAL | 0 refills | Status: DC

## 2017-01-30 NOTE — Progress Notes (Signed)
Chief Complaint  Patient presents with  . Urinary Urgency  . Dysuria    HPI: [name] 76 y.o.   sda   Has hx of recurrent utis  Has been to urologist  In past   sda   . Dr Jeffie Pollock  Onset :  Off and on for a dew weeks  But continuing .    Burning and discomfort with urination and  Urgency .      And  Zap pains in bladder at times .   Last week every day .     No fever   No back pain.  Uncertain if has uti like the one that put her in the hospital.   Nausea  All the time    Like when had uti    Ache pain Just under right rib cage an ache and goes around side .  And sometimes severe    Has off and on for a while   No rash  And  Nausea.  Not particularly associated  , No vomiting   Unintended weight loss  ROS: See pertinent positives and negatives per HPI.  Past Medical History:  Diagnosis Date  . Abnormal blood finding    elevated Hg and hct    . ADJ DISORDER WITH MIXED ANXIETY & DEPRESSED MOOD 03/03/2010   Qualifier: Diagnosis of  By: [name], [name]   . Closed head injury 02/01/2011   from syncope and had scalp laceration  neg ct .    Marland Kitchen Closed head injury 11/27/2015  . Gall stones 2016   see ct scan neg HIDA   . GERD (gastroesophageal reflux disease)   . Hearing aid worn   . Hyperlipidemia   . Hypertension    echo nl lv function  mild dilitation 2009  . Medication side effect 09/02/2010   Poss muscle se of 10 crestor   . Migraine    hypnic HA eval by Dr. Earley Favor in the past  . Polycythemia   . 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 Syncope 02/01/2011   In shower on vacation  sustained head laceration  8 sutures Had ed visit neg head ct labs and x ray   . Trigeminal neuralgia pain     Family History  Problem Relation Age of Onset  . Ovarian cancer Mother   . Stroke Mother   . Alcohol abuse Father   . Stroke Father   . Seizures Daughter   . Hypertension Unknown   . Diabetes Brother   . Cancer Paternal Aunt        leukemia, unknown type  . Colon cancer  Neg Hx     Social History   Social History  . Marital status: Married    Spouse name: N/A  . Number of children: 2  . Years of education: N/A   Occupational History  .      retired Forensic psychologist   Social History Main Topics  . Smoking status: Never Smoker  . Smokeless tobacco: Never Used  . Alcohol use 1.2 oz/week    2 Glasses of wine per week  . Drug use: No  . Sexual activity: Not on file   Other Topics Concern  . 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 albuterol (PROVENTIL HFA;VENTOLIN HFA) 108 (90 Base) MCG/ACT inhaler Inhale 2 puffs into the lungs every 6 (six) hours as needed. 1 Inhaler 2  . amLODipine (NORVASC) 10 MG tablet TAKE ONE TABLET BY MOUTH ONCE DAILY 90 tablet 1  . ASCOMP-CODEINE 50-325-40-30 MG capsule TAKE 1-2 CAPSULES BY MOUTH EVERY FOUR TO SIX HOURS AS NEEDED FOR MIGRAINE MAX OF 6 CAPSULES IN 24 HOURS 30 capsule 0  . Aspirin-Acetaminophen-Caffeine (EXCEDRIN PO) Take by mouth.    . benazepril (LOTENSIN) 20 MG tablet TAKE ONE TABLET BY MOUTH ONCE DAILY 90 tablet 0  . budesonide-formoterol (SYMBICORT) 160-4.5 MCG/ACT inhaler Inhale 2 puffs into the lungs 2 (two) times daily. (Patient taking differently: Inhale 2 puffs into the lungs 2 (two) times daily. ) 1 Inhaler 2  . chlorthalidone (HYGROTON) 25 MG tablet TAKE ONE TABLET BY MOUTH ONCE DAILY 90 tablet 0  . Cyanocobalamin (B-12 PO) Take by mouth.    . EPITOL 200 MG tablet TAKE TWO TABLETS BY MOUTH TWICE DAILY 360 tablet 1  . metoprolol tartrate (LOPRESSOR) 50 MG tablet Take 0.5 tablets (25 mg total) by mouth 2 (two) times daily. 90 tablet 1  . PREMARIN vaginal cream Place 1 Applicatorful vaginally. 3 days/week  0  . RaNITidine HCl (ZANTAC PO) Take by mouth.    . RESTASIS 0.05 % ophthalmic emulsion Place 0.05 drops  into both eyes 2 (two) times daily.  1   No facility-administered medications prior to visit.      EXAM:  BP 120/90 (BP Location: Right Arm, Patient Position: Sitting, Cuff Size: Normal)   Pulse (!) 56   Temp 97.7 F (36.5 C) (Oral)   Wt 143 lb 3.2 oz (65 kg)   BMI 23.29 kg/m   Body mass index is 23.29 kg/m.  GENERAL: vitals reviewed and listed above, alert, oriented, appears well hydrated and in no acute distress HEENT: atraumatic, conjunctiva  clear, no obvious abnormalities on inspection of external nose and ears NECK: no obvious masses on inspection palpation  LUNGS: clear to auscultation bilaterally, no wheezes, rales or rhonchi, good air movement CV: HRRR, no clubbing cyanosis or  peripheral edema nl cap refill  Abdomen:  Sof,t normal bowel sounds without hepatosplenomegaly, no guarding rebound or masses no CVA tenderness MS: moves all extremities without noticeable focal  Abnormality Skin nonicteric  PSYCH: pleasant and cooperative, no obvious depression or anxiety Lab Results  Component Value Date   WBC 7.5 04/19/2016   HGB 14.8 04/19/2016   HCT 43.5 04/19/2016   PLT 238.0 04/19/2016   GLUCOSE 95 06/27/2016   CHOL 234 (H) 04/19/2016   TRIG 59.0 04/19/2016   HDL 93.50 04/19/2016   LDLDIRECT 135.9 10/16/2012   LDLCALC 129 (H) 04/19/2016   ALT 18 04/19/2016   AST 16 04/19/2016   NA 133 (L) 06/27/2016   K 3.9 06/27/2016   CL 96 06/27/2016   CREATININE 0.82 06/27/2016   BUN 19 06/27/2016   CO2 32 06/27/2016   TSH 0.86 04/19/2016   INR 1.23 03/12/2015    ASSESSMENT AND PLAN:  Discussed the following assessment and plan:  Dysuria - Plan: POCT Urinalysis Dipstick (Automated), Urine Culture, CANCELED: Urine Culture  Right upper quadrant abdominal pain of unknown etiology - under rib cage without  assoc sx . uncertain cause  nl exam had choly [place] order updated  - Plan: US Abdomen Complete  History of recurrent UTI (urinary tract infection) - Plan: US Abdomen  Complete, POCT Urinalysis Dipstick (Automated), Urine Culture, CANCELED: Urine Culture  Nausea - intermittent  w/o vomiting  - Plan: US Abdomen Complete Hx   Of serious pyelo ua dip neg screen but sx cw uti  Get cx  And plan after that .  Get yeary  viist for NOvember and can do labs at that visit  -Patient advised to return or notify health care team  if symptoms worsen ,persist or new concerns arise.  Patient Instructions  Your urine screen .  Is neg  Today but checking    Culture.   If getting worse like uti can begin antibiotic .  If  Pain and nausea continuing we will plan and ultrasound  And lab  Of liver  Abdomen .           [name]. Valeta Paz M.D.

## 2017-01-30 NOTE — Patient Instructions (Addendum)
Your urine screen .  Is neg  Today but checking    Culture.   If getting worse like uti can begin antibiotic .  If  Pain and nausea continuing we will plan and ultrasound  And lab  Of liver  Abdomen .

## 2017-01-31 NOTE — Telephone Encounter (Signed)
Sending in a 90 day supply for blood pressure medication. Please help patient schedule yearly visit for Nov or Dec. Thank you .

## 2017-01-31 NOTE — Telephone Encounter (Signed)
lmom for pt to call back

## 2017-02-02 LAB — URINE CULTURE
MICRO NUMBER:: 80993799
SPECIMEN QUALITY:: ADEQUATE

## 2017-02-03 ENCOUNTER — Other Ambulatory Visit: Payer: Self-pay | Admitting: Emergency Medicine

## 2017-02-03 MED ORDER — AMOXICILLIN 500 MG PO CAPS: 500.0000 mg | ORAL_CAPSULE | Freq: Three times a day (TID) | ORAL | 0 refills | Status: DC

## 2017-02-03 NOTE — Telephone Encounter (Signed)
Pt has been sch

## 2017-02-07 ENCOUNTER — Encounter: Payer: Self-pay | Admitting: Internal Medicine

## 2017-02-07 ENCOUNTER — Telehealth: Payer: Self-pay | Admitting: Internal Medicine

## 2017-02-07 NOTE — Telephone Encounter (Signed)
Would like to have a call back concerning a personal matter will not elaborate on the matter.

## 2017-02-07 NOTE — Telephone Encounter (Signed)
Spoke with patient regarding lab results and starting the antibiotic that was sent to pharmacy. Patient states that she just got back in town and just picked up the medication from pharmacy. Patient starting taking both Ceftin and Amoxicillin. Patient states that she is still having stomach pain but explained that was the reason she came to see Dr. Regis Bill. Patient states that she has an appointment set up for ultrasound Friday. Advised patient to start taking medication but if she starts to feel worse then to give the office a call back. Patient understood and nothing further is needed

## 2017-02-08 ENCOUNTER — Other Ambulatory Visit: Payer: Self-pay | Admitting: Internal Medicine

## 2017-02-10 ENCOUNTER — Ambulatory Visit
Admission: RE | Admit: 2017-02-10 | Discharge: 2017-02-10 | Disposition: A | Discharge status: Home / Self Care | Payer: Medicare Other | Source: Ambulatory Visit | Attending: Internal Medicine | Admitting: Internal Medicine

## 2017-02-10 ENCOUNTER — Encounter: Payer: Self-pay | Admitting: Internal Medicine

## 2017-02-10 DIAGNOSIS — K8020 Calculus of gallbladder without cholecystitis without obstruction: Secondary | ICD-10-CM | POA: Diagnosis not present

## 2017-02-10 DIAGNOSIS — R110 Nausea: Secondary | ICD-10-CM

## 2017-02-10 DIAGNOSIS — R1011 Right upper quadrant pain: Secondary | ICD-10-CM

## 2017-02-10 DIAGNOSIS — Z87440 Personal history of urinary (tract) infections: Secondary | ICD-10-CM

## 2017-02-12 ENCOUNTER — Encounter: Payer: Self-pay | Admitting: Internal Medicine

## 2017-02-13 NOTE — Telephone Encounter (Signed)
Pt need new Rx for chlorthalidone   Pharm:  [facility] on [address].

## 2017-02-13 NOTE — Telephone Encounter (Signed)
Will send to Dr Regis Bill as Juluis Rainier.

## 2017-02-16 ENCOUNTER — Encounter: Payer: Self-pay | Admitting: Internal Medicine

## 2017-02-16 ENCOUNTER — Ambulatory Visit: Payer: Self-pay | Admitting: Surgery

## 2017-02-16 DIAGNOSIS — K8010 Calculus of gallbladder with chronic cholecystitis without obstruction: Secondary | ICD-10-CM | POA: Diagnosis not present

## 2017-02-16 HISTORY — PX: OTHER SURGICAL HISTORY: SHX169

## 2017-02-17 NOTE — Patient Instructions (Addendum)
[name]  02/17/2017   Your procedure is scheduled on: Tuesday 02-21-17  Report to [facility] Main  Entrance Take [name]  elevators to 3rd floor to  [place] at 730  AM.   Call this number if you have problems the morning of surgery [phone]   Remember: ONLY 1 PERSON MAY GO WITH YOU TO SHORT STAY TO GET  READY MORNING OF [name].  Do not eat food or drink liquids :After Midnight.     Take these medicines the morning of surgery with A SIP OF WATER: ALBUTEROL INHALER IF NEEDED AND BRING WITH YOU,  SYMBICORT INHALER IF NEEDED AND BRING WITH YOU, METORPROLOL TARTRATE, RESTASIS EYE DROP, EPITOL, FLONASE SPRAY IF NEEDED              You may not have any metal on your body including hair pins and              piercings  Do not wear jewelry, make-up, lotions, powders or perfumes, deodorant             Do not wear nail polish.  Do not shave  48 hours prior to surgery.              Men may shave face and neck.   Do not bring valuables to the hospital. [name].  Contacts, dentures or bridgework may not be worn into surgery.  Leave suitcase in the car. After surgery it may be brought to your room.                   Please read over the following fact sheets you were given: _____________________________________________________________________             [facility] - Preparing for Surgery Before surgery, you can play an important role.  Because skin is not sterile, your skin needs to be as free of germs as possible.  You can reduce the number of germs on your skin by washing with CHG (chlorahexidine gluconate) soap before surgery.  CHG is an antiseptic cleaner which kills germs and bonds with the skin to continue killing germs even after washing. Please DO NOT use if you have an allergy to CHG or antibacterial soaps.  If your skin becomes reddened/irritated stop using the CHG and inform your  nurse when you arrive at Short Stay. Do not shave (including legs and underarms) for at least 48 hours prior to the first CHG shower.  You may shave your face/neck. Please follow these instructions carefully:  1.  Shower with CHG Soap the night before surgery and the  morning of Surgery.  2.  If you choose to wash your hair, wash your hair first as usual with your  normal  shampoo.  3.  After you shampoo, rinse your hair and body thoroughly to remove the  shampoo.                           4.  Use CHG as you would any other liquid soap.  You can apply chg directly  to the skin and wash                       Gently with  a scrungie or clean washcloth.  5.  Apply the CHG Soap to your body ONLY FROM THE NECK DOWN.   Do not use on face/ open                           Wound or open sores. Avoid contact with eyes, ears mouth and genitals (private parts).                       Wash face,  Genitals (private parts) with your normal soap.             6.  Wash thoroughly, paying special attention to the area where your surgery  will be performed.  7.  Thoroughly rinse your body with warm water from the neck down.  8.  DO NOT shower/wash with your normal soap after using and rinsing off  the CHG Soap.                9.  Pat yourself dry with a clean towel.            10.  Wear clean pajamas.            11.  Place clean sheets on your bed the night of your first shower and do not  sleep with pets. Day of Surgery : Do not apply any lotions/deodorants the morning of surgery.  Please wear clean clothes to the hospital/surgery center.  FAILURE TO FOLLOW THESE INSTRUCTIONS MAY RESULT IN THE CANCELLATION OF YOUR SURGERY PATIENT SIGNATURE_________________________________  NURSE SIGNATURE__________________________________  ________________________________________________________________________

## 2017-02-20 ENCOUNTER — Encounter (HOSPITAL_COMMUNITY): Payer: Self-pay

## 2017-02-20 ENCOUNTER — Encounter (HOSPITAL_COMMUNITY)
Admission: RE | Admit: 2017-02-20 | Discharge: 2017-02-20 | Disposition: A | Discharge status: Home / Self Care | Payer: Medicare Other | Source: Ambulatory Visit | Attending: Surgery | Admitting: Surgery

## 2017-02-20 ENCOUNTER — Encounter (HOSPITAL_COMMUNITY): Payer: Self-pay | Admitting: Surgery

## 2017-02-20 DIAGNOSIS — Z808 Family history of malignant neoplasm of other organs or systems: Secondary | ICD-10-CM | POA: Diagnosis not present

## 2017-02-20 DIAGNOSIS — Z888 Allergy status to other drugs, medicaments and biological substances status: Secondary | ICD-10-CM | POA: Diagnosis not present

## 2017-02-20 DIAGNOSIS — Z86010 Personal history of colonic polyps: Secondary | ICD-10-CM | POA: Diagnosis not present

## 2017-02-20 DIAGNOSIS — K828 Other specified diseases of gallbladder: Secondary | ICD-10-CM | POA: Diagnosis not present

## 2017-02-20 DIAGNOSIS — G43909 Migraine, unspecified, not intractable, without status migrainosus: Secondary | ICD-10-CM | POA: Diagnosis not present

## 2017-02-20 DIAGNOSIS — Z881 Allergy status to other antibiotic agents status: Secondary | ICD-10-CM | POA: Diagnosis not present

## 2017-02-20 DIAGNOSIS — I10 Essential (primary) hypertension: Secondary | ICD-10-CM | POA: Diagnosis not present

## 2017-02-20 DIAGNOSIS — Z820 Family history of epilepsy and other diseases of the nervous system: Secondary | ICD-10-CM | POA: Diagnosis not present

## 2017-02-20 DIAGNOSIS — K5900 Constipation, unspecified: Secondary | ICD-10-CM | POA: Diagnosis not present

## 2017-02-20 DIAGNOSIS — Z811 Family history of alcohol abuse and dependence: Secondary | ICD-10-CM | POA: Diagnosis not present

## 2017-02-20 DIAGNOSIS — Z90710 Acquired absence of both cervix and uterus: Secondary | ICD-10-CM | POA: Diagnosis not present

## 2017-02-20 DIAGNOSIS — Z79899 Other long term (current) drug therapy: Secondary | ICD-10-CM | POA: Diagnosis not present

## 2017-02-20 DIAGNOSIS — K219 Gastro-esophageal reflux disease without esophagitis: Secondary | ICD-10-CM | POA: Diagnosis not present

## 2017-02-20 DIAGNOSIS — Z8261 Family history of arthritis: Secondary | ICD-10-CM | POA: Diagnosis not present

## 2017-02-20 DIAGNOSIS — Z8041 Family history of malignant neoplasm of ovary: Secondary | ICD-10-CM | POA: Diagnosis not present

## 2017-02-20 DIAGNOSIS — Z885 Allergy status to narcotic agent status: Secondary | ICD-10-CM | POA: Diagnosis not present

## 2017-02-20 DIAGNOSIS — K8010 Calculus of gallbladder with chronic cholecystitis without obstruction: Secondary | ICD-10-CM | POA: Diagnosis not present

## 2017-02-20 DIAGNOSIS — Z23 Encounter for immunization: Secondary | ICD-10-CM | POA: Diagnosis not present

## 2017-02-20 HISTORY — DX: Other complications of anesthesia, initial encounter: T88.59XA

## 2017-02-20 HISTORY — DX: Renal tubulo-interstitial disease, unspecified: N15.9

## 2017-02-20 HISTORY — DX: Adverse effect of unspecified anesthetic, initial encounter: T41.45XA

## 2017-02-20 HISTORY — DX: Fatty (change of) liver, not elsewhere classified: K76.0

## 2017-02-20 HISTORY — DX: Trigeminal neuralgia: G50.0

## 2017-02-20 HISTORY — DX: Unspecified hearing loss, unspecified ear: H91.90

## 2017-02-20 LAB — BASIC METABOLIC PANEL
Anion gap: 9 (ref 5–15)
BUN: 18 mg/dL (ref 6–20)
CO2: 26 mmol/L (ref 22–32)
Calcium: 9.3 mg/dL (ref 8.9–10.3)
Chloride: 98 mmol/L — ABNORMAL LOW (ref 101–111)
Creatinine, Ser: 0.73 mg/dL (ref 0.44–1.00)
GFR calc Af Amer: >60 mL/min (ref >60)
GFR calc non Af Amer: >60 mL/min (ref >60)
Glucose, Bld: 100 mg/dL — ABNORMAL HIGH (ref 65–99)
Potassium: 3.8 mmol/L (ref 3.5–5.1)
Sodium: 133 mmol/L — ABNORMAL LOW (ref 135–145)

## 2017-02-20 LAB — CBC
HCT: 42.1 % (ref 36.0–46.0)
Hemoglobin: 14.9 g/dL (ref 12.0–15.0)
MCH: 30.7 pg (ref 26.0–34.0)
MCHC: 35.4 g/dL (ref 30.0–36.0)
MCV: 86.6 fL (ref 78.0–100.0)
Platelets: 200 10*3/uL (ref 150–400)
RBC: 4.86 MIL/uL (ref 3.87–5.11)
RDW: 12.5 % (ref 11.5–15.5)
WBC: 4.1 10*3/uL (ref 4.0–10.5)

## 2017-02-20 NOTE — H&P (Signed)
General Surgery [facility]  [name] 02/16/2017 2:09 PM Location: [facility] Patient #: [id number] DOB: 07/21/1940 Married / Language: Cleophus Molt / Race: White Female   History of Present Illness [name] MD; 02/16/2017 2:43 PM) The patient is a 76 year old female who presents for evaluation of gall stones.  CC: symptomatic cholelithiasis  Patient is referred by Dr. Jonathon Jordan for evaluation and management of symptomatic cholelithiasis and chronic cholecystitis. Patient has had a several month history of intermittent epigastric and right upper quadrant abdominal pain radiating to the back associated with fatty food intake. Episodes occur frequently after meals. They last for 3-4 hours. They're associated with nausea and bloating. Patient has had no emesis. She denies any history of jaundice or acholic stools. She denies fevers or chills. She has no history of hepatobiliary or pancreatic disease. Patient has undergone previous total abdominal hysterectomy and a separate procedure for bilateral salpingo-oophorectomy. She has had no upper abdominal surgery. There is a family history of gallbladder disease and the patient's mother. She presents today accompanied by her husband.   Past Surgical History Mammie Lorenzo, LPN; 6/76/7209 4:70 PM) Cataract Surgery  Bilateral. Colon Polyp Removal - Colonoscopy  Hysterectomy (not due to cancer) - Complete  Shoulder Surgery  Right.  Diagnostic Studies History Mammie Lorenzo, LPN; 9/62/8366 2:94 PM) Colonoscopy  5-10 years ago Mammogram  within last year Pap Smear  >5 years ago  Allergies Mammie Lorenzo, LPN; 7/65/4650 3:54 PM) Hydrocodone-Acetaminophen *ANALGESICS - OPIOID*  Headache. OxyCODONE HCl *ANALGESICS - OPIOID*  Headache. Bactrim *ANTI-INFECTIVE AGENTS - MISC.*  Hives, Itching. Allergies Reconciled   Medication History Mammie Lorenzo, LPN; 6/56/8127 5:17  PM) Albuterol (90MCG/ACT Aerosol Soln, Inhalation as needed) Active. Norvasc (10MG Tablet, Oral) Active. Excedrin Extra Strength (250-250-65MG Tablet, Oral as needed) Active. Lotensin (20MG Tablet, Oral) Active. Symbicort (160-4.5MCG/ACT Aerosol, Inhalation as needed) Active. Hygroton (25MG Tablet, Oral) Active. Vitamin B12 (100MCG Tablet, Oral) Active. Epitol (200MG Tablet, Oral) Active. Lopressor (50MG Tablet, Oral) Active. Premarin (0.625MG/GM Cream, Vaginal) Active. Zantac 150 Maximum Strength (150MG Tablet, Oral as needed) Active. Restasis (0.05% Emulsion, Ophthalmic) Active. Medications Reconciled  Social History Mammie Lorenzo, LPN; 0/05/7492 4:96 PM) Alcohol use  Occasional alcohol use. Caffeine use  Coffee. No drug use  Tobacco use  Never smoker.  Family History Mammie Lorenzo, LPN; 7/59/1638 4:66 PM) Alcohol Abuse  Daughter, Father. Arthritis  Father. Cervical Cancer  Mother. Migraine Headache  Daughter, Mother, Son. Ovarian Cancer  Mother. Seizure disorder  Daughter. Thyroid problems  Daughter.  Pregnancy / Birth History Mammie Lorenzo, LPN; 5/99/3570 1:77 PM) Age at menarche  70 years. Age of menopause  61-50 Gravida  2 Maternal age  53-25 Para  2  Other Problems Mammie Lorenzo, LPN; 9/39/0300 9:23 PM) Cholelithiasis  High blood pressure  Migraine Headache  Oophorectomy     Review of Systems Claiborne Billings Dockery LPN; 3/00/7622 6:33 PM) HEENT Present- Hearing Loss, Ringing in the Ears, Seasonal Allergies, Sinus Pain and Visual Disturbances. Not Present- Earache, Hoarseness, Nose Bleed, Oral Ulcers, Sore Throat, Wears glasses/contact lenses and Yellow Eyes. Respiratory Not Present- Bloody sputum, Chronic Cough, Difficulty Breathing, Snoring and Wheezing. Breast Not Present- Breast Mass, Breast Pain, Nipple Discharge and Skin Changes. Cardiovascular Not Present- Chest Pain, Difficulty Breathing Lying Down, Leg Cramps, Palpitations,  Rapid Heart Rate, Shortness of Breath and Swelling of Extremities. Gastrointestinal Present- Constipation. Not Present- Abdominal Pain, Bloating, Bloody Stool, Change in Bowel Habits, Chronic diarrhea, Difficulty Swallowing, Excessive gas, Gets full quickly  at meals, Hemorrhoids, Indigestion, Nausea, Rectal Pain and Vomiting. Female Genitourinary Not Present- Frequency, Nocturia, Painful Urination, Pelvic Pain and Urgency. Musculoskeletal Not Present- Back Pain, Joint Pain, Joint Stiffness, Muscle Pain, Muscle Weakness and Swelling of Extremities. Neurological Present- Headaches. Not Present- Decreased Memory, Fainting, Numbness, Seizures, Tingling, Tremor, Trouble walking and Weakness. Psychiatric Not Present- Anxiety, Bipolar, Change in Sleep Pattern, Depression, Fearful and Frequent crying. Endocrine Not Present- Cold Intolerance, Excessive Hunger, Hair Changes, Heat Intolerance, Hot flashes and New Diabetes. Hematology Not Present- Blood Thinners, Easy Bruising, Excessive bleeding, Gland problems, HIV and Persistent Infections.  Vitals Claiborne Billings Dockery LPN; 4/43/1540 0:86 PM) 02/16/2017 2:09 PM Weight: 141.2 lb Height: 66in Body Surface Area: 1.73 m Body Mass Index: 22.79 kg/m  Temp.: 98.92F(Temporal)  Pulse: 68 (Regular)  BP: 122/76 (Sitting, Right Arm, Standard)       Physical Exam [name] MD; 02/16/2017 2:44 PM) The physical exam findings are as follows: Note:CONSTITUTIONAL See vital signs recorded above  GENERAL APPEARANCE Development: normal Nutritional status: normal Gross deformities: none  SKIN Rash, lesions, ulcers: none Induration, erythema: none Nodules: none palpable  EYES Conjunctiva and lids: normal Pupils: equal and reactive Iris: normal bilaterally  EARS, NOSE, MOUTH, THROAT External ears: no lesion or deformity External nose: no lesion or deformity Hearing: grossly normal Lips: no lesion or deformity Dentition: normal for  age Oral mucosa: moist  NECK Symmetric: yes Trachea: midline Thyroid: no palpable nodules in the thyroid bed Well-healed surgical incision left supraclavicular fossa  CHEST Respiratory effort: normal Retraction or accessory muscle use: no Breath sounds: normal bilaterally Rales, rhonchi, wheeze: none  CARDIOVASCULAR Auscultation: regular rhythm, normal rate Murmurs: none Pulses: carotid and radial pulse 2+ palpable Lower extremity edema: Mild-to-moderate bilaterally Lower extremity varicosities: none  ABDOMEN Distension: none Masses: none palpable Tenderness: mild tenderness RUQ with guarding, no mass Hepatosplenomegaly: not present Hernia: not present  MUSCULOSKELETAL Station and gait: normal Digits and nails: no clubbing or cyanosis Muscle strength: grossly normal all extremities Range of motion: grossly normal all extremities Deformity: none  LYMPHATIC Cervical: none palpable Supraclavicular: none palpable  PSYCHIATRIC Oriented to person, place, and time: yes Mood and affect: normal for situation Judgment and insight: appropriate for situation    Assessment & Plan [name] MD; 02/16/2017 2:46 PM) 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 Patient presents with chronic cholecystitis and cholelithiasis. She has been symptomatic for several months. She is accompanied by her husband. They are provided with written literature regarding gallbladder surgery to review at home.  I have recommended proceeding with laparoscopic cholecystectomy with intraoperative cholangiography. We have discussed the procedure. We have discussed the risk and benefits. They are provided with written literature regarding the procedure to review at home. We have discussed the hospital stay and the postoperative  recovery. She understands and wishes to proceed with surgery in the near future.  The risks and benefits of the procedure have been discussed at length with the patient. The patient understands the proposed procedure, potential alternative treatments, and the course of recovery to be expected. All of the patient's questions have been answered at this time. The patient wishes to proceed with surgery.   Armandina Gemma, [place] [facility] Office: [phone]

## 2017-02-21 ENCOUNTER — Ambulatory Visit (HOSPITAL_COMMUNITY): Payer: Medicare Other | Admitting: Anesthesiology

## 2017-02-21 ENCOUNTER — Ambulatory Visit (HOSPITAL_COMMUNITY): Payer: Medicare Other

## 2017-02-21 ENCOUNTER — Encounter (HOSPITAL_COMMUNITY)
Admission: RE | Disposition: A | Discharge status: Home / Self Care | Payer: Self-pay | Source: Ambulatory Visit | Attending: Surgery

## 2017-02-21 ENCOUNTER — Observation Stay (HOSPITAL_COMMUNITY)
Admission: RE | Admit: 2017-02-21 | Discharge: 2017-02-22 | Disposition: A | Discharge status: Home / Self Care | Payer: Medicare Other | Source: Ambulatory Visit | Attending: Surgery | Admitting: Surgery

## 2017-02-21 ENCOUNTER — Encounter (HOSPITAL_COMMUNITY): Payer: Self-pay | Admitting: *Deleted

## 2017-02-21 DIAGNOSIS — K8020 Calculus of gallbladder without cholecystitis without obstruction: Secondary | ICD-10-CM | POA: Diagnosis not present

## 2017-02-21 DIAGNOSIS — Z8041 Family history of malignant neoplasm of ovary: Secondary | ICD-10-CM | POA: Insufficient documentation

## 2017-02-21 DIAGNOSIS — Z23 Encounter for immunization: Secondary | ICD-10-CM | POA: Diagnosis not present

## 2017-02-21 DIAGNOSIS — Z811 Family history of alcohol abuse and dependence: Secondary | ICD-10-CM | POA: Insufficient documentation

## 2017-02-21 DIAGNOSIS — Z8261 Family history of arthritis: Secondary | ICD-10-CM | POA: Insufficient documentation

## 2017-02-21 DIAGNOSIS — Z820 Family history of epilepsy and other diseases of the nervous system: Secondary | ICD-10-CM | POA: Insufficient documentation

## 2017-02-21 DIAGNOSIS — Z90710 Acquired absence of both cervix and uterus: Secondary | ICD-10-CM | POA: Insufficient documentation

## 2017-02-21 DIAGNOSIS — Z79899 Other long term (current) drug therapy: Secondary | ICD-10-CM | POA: Insufficient documentation

## 2017-02-21 DIAGNOSIS — Z888 Allergy status to other drugs, medicaments and biological substances status: Secondary | ICD-10-CM | POA: Diagnosis not present

## 2017-02-21 DIAGNOSIS — Z419 Encounter for procedure for purposes other than remedying health state, unspecified: Secondary | ICD-10-CM

## 2017-02-21 DIAGNOSIS — Z881 Allergy status to other antibiotic agents status: Secondary | ICD-10-CM | POA: Diagnosis not present

## 2017-02-21 DIAGNOSIS — K219 Gastro-esophageal reflux disease without esophagitis: Secondary | ICD-10-CM | POA: Insufficient documentation

## 2017-02-21 DIAGNOSIS — K5900 Constipation, unspecified: Secondary | ICD-10-CM | POA: Insufficient documentation

## 2017-02-21 DIAGNOSIS — G43909 Migraine, unspecified, not intractable, without status migrainosus: Secondary | ICD-10-CM | POA: Insufficient documentation

## 2017-02-21 DIAGNOSIS — Z808 Family history of malignant neoplasm of other organs or systems: Secondary | ICD-10-CM | POA: Diagnosis not present

## 2017-02-21 DIAGNOSIS — I10 Essential (primary) hypertension: Secondary | ICD-10-CM | POA: Insufficient documentation

## 2017-02-21 DIAGNOSIS — K828 Other specified diseases of gallbladder: Secondary | ICD-10-CM | POA: Diagnosis not present

## 2017-02-21 DIAGNOSIS — Z86010 Personal history of colonic polyps: Secondary | ICD-10-CM | POA: Diagnosis not present

## 2017-02-21 DIAGNOSIS — Z885 Allergy status to narcotic agent status: Secondary | ICD-10-CM | POA: Insufficient documentation

## 2017-02-21 DIAGNOSIS — K8010 Calculus of gallbladder with chronic cholecystitis without obstruction: Principal | ICD-10-CM | POA: Insufficient documentation

## 2017-02-21 DIAGNOSIS — E785 Hyperlipidemia, unspecified: Secondary | ICD-10-CM | POA: Diagnosis not present

## 2017-02-21 DIAGNOSIS — R0602 Shortness of breath: Secondary | ICD-10-CM | POA: Diagnosis not present

## 2017-02-21 HISTORY — PX: CHOLECYSTECTOMY: SHX55

## 2017-02-21 SURGERY — LAPAROSCOPIC CHOLECYSTECTOMY WITH INTRAOPERATIVE CHOLANGIOGRAM
Anesthesia: General

## 2017-02-21 MED ORDER — CODEINE SULFATE 30 MG PO TABS
60.0000 mg | ORAL_TABLET | Freq: Four times a day (QID) | ORAL | Status: DC | PRN
  Administered 2017-02-21: 60 mg via ORAL
  Filled 2017-02-21: qty 2

## 2017-02-21 MED ORDER — BUTALBITAL-ASA-CAFF-CODEINE 50-325-40-30 MG PO CAPS: 2.0000 | ORAL_CAPSULE | Freq: Four times a day (QID) | ORAL | Status: DC | PRN

## 2017-02-21 MED ORDER — BENAZEPRIL HCL 10 MG PO TABS
20.0000 mg | ORAL_TABLET | Freq: Every day | ORAL | Status: DC
  Administered 2017-02-21: 20 mg via ORAL
  Filled 2017-02-21: qty 2

## 2017-02-21 MED ORDER — SUGAMMADEX SODIUM 200 MG/2ML IV SOLN
INTRAVENOUS | Status: DC | PRN
  Administered 2017-02-21: 200 mg via INTRAVENOUS

## 2017-02-21 MED ORDER — ONDANSETRON HCL 4 MG/2ML IJ SOLN
INTRAMUSCULAR | Status: AC
  Filled 2017-02-21: qty 2

## 2017-02-21 MED ORDER — HYDROMORPHONE HCL 1 MG/ML IJ SOLN
1.0000 mg | INTRAMUSCULAR | Status: DC | PRN
  Administered 2017-02-21: 1 mg via INTRAVENOUS
  Filled 2017-02-21: qty 1

## 2017-02-21 MED ORDER — IOPAMIDOL (ISOVUE-300) INJECTION 61%
INTRAVENOUS | Status: DC | PRN
Start: 2017-02-21 — End: 2017-02-21
  Administered 2017-02-21: 10 mL

## 2017-02-21 MED ORDER — BUPIVACAINE-EPINEPHRINE 0.25% -1:200000 IJ SOLN
INTRAMUSCULAR | Status: DC | PRN
  Administered 2017-02-21: 25 mL

## 2017-02-21 MED ORDER — ONDANSETRON 4 MG PO TBDP: 4.0000 mg | ORAL_TABLET | Freq: Four times a day (QID) | ORAL | Status: DC | PRN

## 2017-02-21 MED ORDER — EPHEDRINE SULFATE 50 MG/ML IJ SOLN
INTRAMUSCULAR | Status: DC | PRN
  Administered 2017-02-21: 10 mg via INTRAVENOUS

## 2017-02-21 MED ORDER — ROCURONIUM BROMIDE 50 MG/5ML IV SOSY
PREFILLED_SYRINGE | INTRAVENOUS | Status: AC
  Filled 2017-02-21: qty 5

## 2017-02-21 MED ORDER — CHLORTHALIDONE 25 MG PO TABS
25.0000 mg | ORAL_TABLET | Freq: Every day | ORAL | Status: DC
  Filled 2017-02-21 (×3): qty 1

## 2017-02-21 MED ORDER — LACTATED RINGERS IR SOLN
Status: DC | PRN
  Administered 2017-02-21: 1000 mL

## 2017-02-21 MED ORDER — CARBAMAZEPINE 200 MG PO TABS
400.0000 mg | ORAL_TABLET | Freq: Two times a day (BID) | ORAL | Status: DC
  Administered 2017-02-21: 400 mg via ORAL
  Filled 2017-02-21 (×2): qty 2

## 2017-02-21 MED ORDER — BUTALBITAL-APAP-CAFFEINE 50-325-40 MG PO TABS
2.0000 | ORAL_TABLET | Freq: Four times a day (QID) | ORAL | Status: DC | PRN
  Administered 2017-02-22: 2 via ORAL

## 2017-02-21 MED ORDER — TRAMADOL HCL 50 MG PO TABS
50.0000 mg | ORAL_TABLET | Freq: Four times a day (QID) | ORAL | Status: DC | PRN
  Administered 2017-02-21: 50 mg via ORAL
  Filled 2017-02-21: qty 1

## 2017-02-21 MED ORDER — INFLUENZA VAC SPLIT HIGH-DOSE 0.5 ML IM SUSY
0.5000 mL | PREFILLED_SYRINGE | INTRAMUSCULAR | Status: AC
  Administered 2017-02-22: 0.5 mL via INTRAMUSCULAR
  Filled 2017-02-21: qty 0.5

## 2017-02-21 MED ORDER — FENTANYL CITRATE (PF) 100 MCG/2ML IJ SOLN
INTRAMUSCULAR | Status: AC
  Filled 2017-02-21: qty 2

## 2017-02-21 MED ORDER — AMLODIPINE BESYLATE 10 MG PO TABS
10.0000 mg | ORAL_TABLET | Freq: Every day | ORAL | Status: DC
  Administered 2017-02-21: 10 mg via ORAL
  Filled 2017-02-21: qty 1

## 2017-02-21 MED ORDER — KCL IN DEXTROSE-NACL 20-5-0.45 MEQ/L-%-% IV SOLN
INTRAVENOUS | Status: DC
  Administered 2017-02-21: 17:00:00 via INTRAVENOUS
  Filled 2017-02-21 (×2): qty 1000

## 2017-02-21 MED ORDER — SUGAMMADEX SODIUM 200 MG/2ML IV SOLN
INTRAVENOUS | Status: AC
  Filled 2017-02-21: qty 2

## 2017-02-21 MED ORDER — CYCLOSPORINE 0.05 % OP EMUL
1.0000 [drp] | Freq: Two times a day (BID) | OPHTHALMIC | Status: DC
  Administered 2017-02-21: 1 [drp] via OPHTHALMIC
  Filled 2017-02-21 (×2): qty 1

## 2017-02-21 MED ORDER — 0.9 % SODIUM CHLORIDE (POUR BTL) OPTIME
TOPICAL | Status: DC | PRN
Start: 2017-02-21 — End: 2017-02-21
  Administered 2017-02-21: 1000 mL

## 2017-02-21 MED ORDER — KETOROLAC TROMETHAMINE 30 MG/ML IJ SOLN: 15.0000 mg | Freq: Once | INTRAMUSCULAR | Status: DC | PRN

## 2017-02-21 MED ORDER — METOPROLOL TARTRATE 25 MG PO TABS
25.0000 mg | ORAL_TABLET | Freq: Two times a day (BID) | ORAL | Status: DC
  Administered 2017-02-21: 25 mg via ORAL
  Filled 2017-02-21: qty 1

## 2017-02-21 MED ORDER — LACTATED RINGERS IV SOLN
INTRAVENOUS | Status: DC
  Administered 2017-02-21: 08:00:00 via INTRAVENOUS

## 2017-02-21 MED ORDER — DEXAMETHASONE SODIUM PHOSPHATE 10 MG/ML IJ SOLN
INTRAMUSCULAR | Status: DC | PRN
  Administered 2017-02-21: 10 mg via INTRAVENOUS

## 2017-02-21 MED ORDER — LIDOCAINE 2% (20 MG/ML) 5 ML SYRINGE
INTRAMUSCULAR | Status: AC
  Filled 2017-02-21: qty 5

## 2017-02-21 MED ORDER — CHLORHEXIDINE GLUCONATE CLOTH 2 % EX PADS: 6.0000 | MEDICATED_PAD | Freq: Once | CUTANEOUS | Status: DC

## 2017-02-21 MED ORDER — PROMETHAZINE HCL 25 MG/ML IJ SOLN: 6.2500 mg | INTRAMUSCULAR | Status: DC | PRN

## 2017-02-21 MED ORDER — EPHEDRINE 5 MG/ML INJ
INTRAVENOUS | Status: AC
  Filled 2017-02-21: qty 10

## 2017-02-21 MED ORDER — LIDOCAINE 2% (20 MG/ML) 5 ML SYRINGE
INTRAMUSCULAR | Status: DC | PRN
  Administered 2017-02-21: 60 mg via INTRAVENOUS

## 2017-02-21 MED ORDER — ONDANSETRON HCL 4 MG/2ML IJ SOLN
INTRAMUSCULAR | Status: DC | PRN
  Administered 2017-02-21: 4 mg via INTRAVENOUS

## 2017-02-21 MED ORDER — CEFAZOLIN SODIUM-DEXTROSE 2-4 GM/100ML-% IV SOLN
2.0000 g | INTRAVENOUS | Status: AC
  Administered 2017-02-21: 2 g via INTRAVENOUS
  Filled 2017-02-21: qty 100

## 2017-02-21 MED ORDER — ALBUTEROL SULFATE (2.5 MG/3ML) 0.083% IN NEBU: 3.0000 mL | INHALATION_SOLUTION | Freq: Four times a day (QID) | RESPIRATORY_TRACT | Status: DC | PRN

## 2017-02-21 MED ORDER — BUPIVACAINE-EPINEPHRINE (PF) 0.25% -1:200000 IJ SOLN
INTRAMUSCULAR | Status: AC
  Filled 2017-02-21: qty 30

## 2017-02-21 MED ORDER — HYDROMORPHONE HCL-NACL 0.5-0.9 MG/ML-% IV SOSY
PREFILLED_SYRINGE | INTRAVENOUS | Status: AC
  Administered 2017-02-21: 0.25 mg via INTRAVENOUS
  Filled 2017-02-21: qty 2

## 2017-02-21 MED ORDER — FENTANYL CITRATE (PF) 100 MCG/2ML IJ SOLN
INTRAMUSCULAR | Status: DC | PRN
  Administered 2017-02-21 (×2): 50 ug via INTRAVENOUS

## 2017-02-21 MED ORDER — PROPOFOL 10 MG/ML IV BOLUS
INTRAVENOUS | Status: AC
  Filled 2017-02-21: qty 20

## 2017-02-21 MED ORDER — ONDANSETRON HCL 4 MG/2ML IJ SOLN
4.0000 mg | Freq: Four times a day (QID) | INTRAMUSCULAR | Status: DC | PRN
  Administered 2017-02-21 – 2017-02-22 (×2): 4 mg via INTRAVENOUS
  Filled 2017-02-21 (×2): qty 2

## 2017-02-21 MED ORDER — HYDROMORPHONE HCL-NACL 0.5-0.9 MG/ML-% IV SOSY
0.2500 mg | PREFILLED_SYRINGE | INTRAVENOUS | Status: DC | PRN
  Administered 2017-02-21: 0.5 mg via INTRAVENOUS
  Administered 2017-02-21 (×2): 0.25 mg via INTRAVENOUS

## 2017-02-21 MED ORDER — ACETAMINOPHEN 325 MG PO TABS: 650.0000 mg | ORAL_TABLET | Freq: Four times a day (QID) | ORAL | Status: DC | PRN

## 2017-02-21 MED ORDER — PROPOFOL 10 MG/ML IV BOLUS
INTRAVENOUS | Status: DC | PRN
  Administered 2017-02-21: 140 mg via INTRAVENOUS

## 2017-02-21 MED ORDER — ACETAMINOPHEN 650 MG RE SUPP: 650.0000 mg | Freq: Four times a day (QID) | RECTAL | Status: DC | PRN

## 2017-02-21 MED ORDER — ROCURONIUM BROMIDE 50 MG/5ML IV SOSY
PREFILLED_SYRINGE | INTRAVENOUS | Status: DC | PRN
  Administered 2017-02-21: 10 mg via INTRAVENOUS
  Administered 2017-02-21: 40 mg via INTRAVENOUS

## 2017-02-21 MED ORDER — IOPAMIDOL (ISOVUE-300) INJECTION 61%
INTRAVENOUS | Status: AC
  Filled 2017-02-21: qty 50

## 2017-02-21 MED ORDER — DEXAMETHASONE SODIUM PHOSPHATE 10 MG/ML IJ SOLN
INTRAMUSCULAR | Status: AC
  Filled 2017-02-21: qty 1

## 2017-02-21 MED ORDER — SUCCINYLCHOLINE CHLORIDE 200 MG/10ML IV SOSY
PREFILLED_SYRINGE | INTRAVENOUS | Status: AC
  Filled 2017-02-21: qty 10

## 2017-02-21 SURGICAL SUPPLY — 34 items
APL SKNCLS STERI-STRIP NONHPOA (GAUZE/BANDAGES/DRESSINGS) ×1
APPLIER CLIP ROT 10 11.4 M/L (STAPLE) ×2
APR CLP MED LRG 11.4X10 (STAPLE) ×1
BAG SPEC RTRVL LRG 6X4 10 (ENDOMECHANICALS) ×1
BENZOIN TINCTURE PRP APPL 2/3 (GAUZE/BANDAGES/DRESSINGS) ×1 IMPLANT
CABLE HIGH FREQUENCY MONO STRZ (ELECTRODE) ×2 IMPLANT
CHLORAPREP W/TINT 26ML (MISCELLANEOUS) ×3 IMPLANT
CLIP APPLIE ROT 10 11.4 M/L (STAPLE) ×1 IMPLANT
COVER MAYO STAND STRL (DRAPES) ×2 IMPLANT
COVER SURGICAL LIGHT HANDLE (MISCELLANEOUS) ×2 IMPLANT
DECANTER SPIKE VIAL GLASS SM (MISCELLANEOUS) ×2 IMPLANT
DRAPE C-ARM 42X120 X-RAY (DRAPES) ×2 IMPLANT
DRSG TEGADERM 2-3/8X2-3/4 SM (GAUZE/BANDAGES/DRESSINGS) ×2 IMPLANT
ELECT REM PT RETURN 15FT ADLT (MISCELLANEOUS) ×2 IMPLANT
GAUZE SPONGE 2X2 8PLY STRL LF (GAUZE/BANDAGES/DRESSINGS) ×1 IMPLANT
GLOVE SURG ORTHO 8.0 STRL STRW (GLOVE) ×2 IMPLANT
GOWN STRL REUS W/TWL XL LVL3 (GOWN DISPOSABLE) ×4 IMPLANT
HEMOSTAT SURGICEL 4X8 (HEMOSTASIS) IMPLANT
KIT BASIN OR (CUSTOM PROCEDURE TRAY) ×2 IMPLANT
POUCH SPECIMEN RETRIEVAL 10MM (ENDOMECHANICALS) ×2 IMPLANT
SCISSORS LAP 5X35 DISP (ENDOMECHANICALS) ×2 IMPLANT
SET CHOLANGIOGRAPH MIX (MISCELLANEOUS) ×2 IMPLANT
SET IRRIG TUBING LAPAROSCOPIC (IRRIGATION / IRRIGATOR) ×2 IMPLANT
SLEEVE XCEL OPT CAN 5 100 (ENDOMECHANICALS) ×2 IMPLANT
SPONGE GAUZE 2X2 STER 10/PKG (GAUZE/BANDAGES/DRESSINGS) ×1
STRIP CLOSURE SKIN 1/2X4 (GAUZE/BANDAGES/DRESSINGS) ×2 IMPLANT
SUT MNCRL AB 4-0 PS2 18 (SUTURE) ×2 IMPLANT
TOWEL OR 17X26 10 PK STRL BLUE (TOWEL DISPOSABLE) ×2 IMPLANT
TOWEL OR NON WOVEN STRL DISP B (DISPOSABLE) ×2 IMPLANT
TRAY LAPAROSCOPIC (CUSTOM PROCEDURE TRAY) ×2 IMPLANT
TROCAR BLADELESS OPT 5 100 (ENDOMECHANICALS) ×2 IMPLANT
TROCAR XCEL BLUNT TIP 100MML (ENDOMECHANICALS) ×2 IMPLANT
TROCAR XCEL NON-BLD 11X100MML (ENDOMECHANICALS) ×2 IMPLANT
TUBING INSUF HEATED (TUBING) ×1 IMPLANT

## 2017-02-21 NOTE — Anesthesia Preprocedure Evaluation (Signed)
Anesthesia Evaluation  Patient identified by MRN, date of birth, ID band Patient awake    Reviewed: Allergy & Precautions, NPO status , Patient's Chart, lab work & pertinent test results  Airway Mallampati: II  TM Distance: >3 FB Neck ROM: Full    Dental no notable dental hx.    Pulmonary neg pulmonary ROS,    Pulmonary exam normal breath sounds clear to auscultation       Cardiovascular hypertension, Pt. on medications and Pt. on home beta blockers Normal cardiovascular exam Rhythm:Regular Rate:Normal     Neuro/Psych negative neurological ROS  negative psych ROS   GI/Hepatic Neg liver ROS, GERD  Medicated,  Endo/Other  negative endocrine ROS  Renal/GU negative Renal ROS  negative genitourinary   Musculoskeletal negative musculoskeletal ROS (+)   Abdominal   Peds negative pediatric ROS (+)  Hematology negative hematology ROS (+)   Anesthesia Other Findings   Reproductive/Obstetrics negative OB ROS                             Anesthesia Physical Anesthesia Plan  ASA: II  Anesthesia Plan: General   Post-op Pain Management:    Induction: Intravenous  PONV Risk Score and Plan: 2 and Ondansetron and Dexamethasone  Airway Management Planned: Oral ETT  Additional Equipment:   Intra-op Plan:   Post-operative Plan: Extubation in OR  Informed Consent: I have reviewed the patients History and Physical, chart, labs and discussed the procedure including the risks, benefits and alternatives for the proposed anesthesia with the patient or authorized representative who has indicated his/her understanding and acceptance.   Dental advisory given  Plan Discussed with: CRNA and Surgeon  Anesthesia Plan Comments:         Anesthesia Quick Evaluation

## 2017-02-21 NOTE — Transfer of Care (Signed)
Immediate Anesthesia Transfer of Care Note  Patient: [name]  Procedure(s) Performed: LAPAROSCOPIC CHOLECYSTECTOMY WITH INTRAOPERATIVE CHOLANGIOGRAM (N/A )  Patient Location: PACU  Anesthesia Type:General  Level of Consciousness: awake and patient cooperative  Airway & Oxygen Therapy: Patient Spontanous Breathing and Patient connected to face mask oxygen  Post-op Assessment: Report given to RN and Post -op Vital signs reviewed and stable  Post vital signs: Reviewed and stable  Last Vitals:  Vitals:   02/21/17 0727  BP: 135/81  Pulse: 65  Resp: 18  Temp: 36.6 C  SpO2: 97%    Last Pain:  Vitals:   02/21/17 0727  TempSrc: Oral      Patients Stated Pain Goal: 3 (02/72/53 6644)  Complications: No apparent anesthesia complications

## 2017-02-21 NOTE — Progress Notes (Signed)
Patient ID: [name], female   DOB: November 27, 1940, 76 y.o.   MRN: [id number]  [facility]  Post op check  Patient comfortable in bed.  Abd soft, dressings dry and intact.  Plan overnight observation, home tomorrow.  Armandina Gemma, [place] [facility] Office: [phone]

## 2017-02-21 NOTE — Anesthesia Procedure Notes (Signed)
Procedure Name: Intubation Date/Time: 02/21/2017 9:37 AM Performed by: Dione Booze Pre-anesthesia Checklist: Suction available, Patient being monitored, Emergency Drugs available and Patient identified Patient Re-evaluated:Patient Re-evaluated prior to induction Oxygen Delivery Method: Circle system utilized Preoxygenation: Pre-oxygenation with 100% oxygen Induction Type: IV induction Ventilation: Mask ventilation without difficulty Laryngoscope Size: Mac and 4 Grade View: Grade I Tube type: Oral Tube size: 7.5 mm Number of attempts: 1 Airway Equipment and Method: Stylet Placement Confirmation: ETT inserted through vocal cords under direct vision,  positive ETCO2 and breath sounds checked- equal and bilateral Secured at: 21 cm Tube secured with: Tape Dental Injury: Teeth and Oropharynx as per pre-operative assessment

## 2017-02-21 NOTE — Anesthesia Postprocedure Evaluation (Signed)
Anesthesia Post Note  Patient: [name]  Procedure(s) Performed: LAPAROSCOPIC CHOLECYSTECTOMY WITH INTRAOPERATIVE CHOLANGIOGRAM (N/A )     Patient location during evaluation: PACU Anesthesia Type: General Level of consciousness: awake and alert Pain management: pain level controlled Vital Signs Assessment: post-procedure vital signs reviewed and stable Respiratory status: spontaneous breathing, nonlabored ventilation, respiratory function stable and patient connected to nasal cannula oxygen Cardiovascular status: blood pressure returned to baseline and stable Postop Assessment: no apparent nausea or vomiting Anesthetic complications: no    Last Vitals:  Vitals:   02/21/17 1200 02/21/17 1215  BP: (!) 163/87 (!) 171/94  Pulse: 64 62  Resp: 14 15  Temp:    SpO2: 100% 100%    Last Pain:  Vitals:   02/21/17 1215  TempSrc:   PainSc: 1                  Ethlyn Alto S

## 2017-02-21 NOTE — Interval H&P Note (Signed)
History and Physical Interval Note:  02/21/2017 9:18 AM  [name]  has presented today for surgery, with the diagnosis of CHRONIC CHOLECYSTITIS,   The various methods of treatment have been discussed with the patient and family. After consideration of risks, benefits and other options for treatment, the patient has consented to    Procedure(s): LAPAROSCOPIC CHOLECYSTECTOMY WITH INTRAOPERATIVE CHOLANGIOGRAM (N/A) as a surgical intervention .    The patient's history has been reviewed, patient examined, no change in status, stable for surgery.  I have reviewed the patient's chart and labs.  Questions were answered to the patient's satisfaction.    Armandina Gemma, [place] [facility] Office: [place]

## 2017-02-21 NOTE — Op Note (Addendum)
Procedure Note  Pre-operative Diagnosis:  Chronic cholecystitis, cholelithiasis  Post-operative Diagnosis:  same  Surgeon:  [name], MD, FACS  Assistant:  none   Procedure:  Laparoscopic cholecystectomy with intra-operative cholangiography  Anesthesia:  General  Estimated Blood Loss:  minimal  Drains: none         Specimen: Gallbladder to pathology  Indications:  The patient is a 76 year old female who presents for evaluation of gall stones.  Patient is referred by Dr. [name] for evaluation and management of symptomatic cholelithiasis and chronic cholecystitis. Patient has had a several month history of intermittent epigastric and right upper quadrant abdominal pain radiating to the back associated with fatty food intake. Episodes occur frequently after meals. They last for 3-4 hours. They're associated with nausea and bloating. Patient has had no emesis. She denies any history of jaundice or acholic stools. She denies fevers or chills. She has no history of hepatobiliary or pancreatic disease. Patient has undergone previous total abdominal hysterectomy and a separate procedure for bilateral salpingo-oophorectomy. She has had no upper abdominal surgery. There is a family history of gallbladder disease and the patient's mother. She presents today accompanied by her husband.   Procedure Details:  The patient was seen in the pre-op holding area. The risks, benefits, complications, treatment options, and expected outcomes were previously discussed with the patient. The patient agreed with the proposed plan and has signed the informed consent form.  The patient was brought to the Operating Room, identified as [name] and the procedure verified as laparoscopic cholecystectomy with intraoperative cholangiography. A "time out" was completed and the above information confirmed.  The patient was placed in the supine position. Following induction of general  anesthesia, the abdomen was prepped and draped in the usual aseptic fashion.  An incision was made in the skin near the umbilicus through the old surgical scar. The midline fascia was incised and the peritoneal cavity was entered and a Hasson canula was introduced under direct vision.  There were some adhesions at the site.  The Hasson canula was secured with a 0-Vicryl pursestring suture. Pneumoperitoneum was established with carbon dioxide. Additional trocars were introduced under direct vision along the right costal margin in the midline, mid-clavicular line, and anterior axillary line.   The gallbladder was identified and the fundus grasped and retracted cephalad. Adhesions were taken down bluntly and the electrocautery was utilized as needed, taking care not to injure any adjacent structures. The infundibulum was grasped and retracted laterally, exposing the peritoneum overlying the triangle of Calot. The peritoneum was incised and structures exposed with blunt dissection. The cystic duct was clearly identified, bluntly dissected circumferentially, and clipped at the neck of the gallbladder.  An incision was made in the cystic duct and the cholangiogram catheter introduced. The catheter was secured using an ligaclip.  Real-time cholangiography was performed using C-arm fluoroscopy.  There was rapid filling of a normal caliber common bile duct.  There was reflux of contrast into the left and right hepatic ductal systems.  There was free flow distally into the duodenum without filling defect or obstruction.  The catheter was removed from the peritoneal cavity.  The cystic duct was then ligated with surgical clips and divided. The cystic artery was identified, dissected circumferentially, ligated with ligaclips, and divided.  The gallbladder was dissected away from the liver bed using the electrocautery for hemostasis. The gallbladder was completely removed from the liver and placed into an endocatch  bag. The gallbladder was  removed in the endocatch bag through the umbilical port site and submitted to pathology for review.  The right upper quadrant was irrigated and the gallbladder bed was inspected. Hemostasis was achieved with the electrocautery.  Pneumoperitoneum was released after viewing removal of the trocars with good hemostasis noted. The umbilical wound was irrigated and the fascia was then closed with the pursestring suture.  Local anesthetic was infiltrated at all port sites. The skin incisions were closed with 4-0 Monocril subcuticular sutures and steri-strips and dressings were applied.  Instrument, sponge, and needle counts were correct at the conclusion of the case.  The patient was awakened from anesthesia and brought to the recovery room in stable condition.  The patient tolerated the procedure well.   [name], MD, [facility] Office: [phone]

## 2017-02-21 NOTE — Telephone Encounter (Signed)
Pt having surgery  This week   Was out of office last week.

## 2017-02-22 DIAGNOSIS — K828 Other specified diseases of gallbladder: Secondary | ICD-10-CM | POA: Diagnosis not present

## 2017-02-22 DIAGNOSIS — Z881 Allergy status to other antibiotic agents status: Secondary | ICD-10-CM | POA: Diagnosis not present

## 2017-02-22 DIAGNOSIS — Z820 Family history of epilepsy and other diseases of the nervous system: Secondary | ICD-10-CM | POA: Diagnosis not present

## 2017-02-22 DIAGNOSIS — Z79899 Other long term (current) drug therapy: Secondary | ICD-10-CM | POA: Diagnosis not present

## 2017-02-22 DIAGNOSIS — G43909 Migraine, unspecified, not intractable, without status migrainosus: Secondary | ICD-10-CM | POA: Diagnosis not present

## 2017-02-22 DIAGNOSIS — I10 Essential (primary) hypertension: Secondary | ICD-10-CM | POA: Diagnosis not present

## 2017-02-22 DIAGNOSIS — Z8041 Family history of malignant neoplasm of ovary: Secondary | ICD-10-CM | POA: Diagnosis not present

## 2017-02-22 DIAGNOSIS — Z888 Allergy status to other drugs, medicaments and biological substances status: Secondary | ICD-10-CM | POA: Diagnosis not present

## 2017-02-22 DIAGNOSIS — Z808 Family history of malignant neoplasm of other organs or systems: Secondary | ICD-10-CM | POA: Diagnosis not present

## 2017-02-22 DIAGNOSIS — Z885 Allergy status to narcotic agent status: Secondary | ICD-10-CM | POA: Diagnosis not present

## 2017-02-22 DIAGNOSIS — Z23 Encounter for immunization: Secondary | ICD-10-CM | POA: Diagnosis not present

## 2017-02-22 DIAGNOSIS — Z811 Family history of alcohol abuse and dependence: Secondary | ICD-10-CM | POA: Diagnosis not present

## 2017-02-22 DIAGNOSIS — Z90710 Acquired absence of both cervix and uterus: Secondary | ICD-10-CM | POA: Diagnosis not present

## 2017-02-22 DIAGNOSIS — Z8261 Family history of arthritis: Secondary | ICD-10-CM | POA: Diagnosis not present

## 2017-02-22 DIAGNOSIS — Z86010 Personal history of colonic polyps: Secondary | ICD-10-CM | POA: Diagnosis not present

## 2017-02-22 DIAGNOSIS — K8010 Calculus of gallbladder with chronic cholecystitis without obstruction: Secondary | ICD-10-CM | POA: Diagnosis not present

## 2017-02-22 MED ORDER — TRAMADOL HCL 50 MG PO TABS: 50.0000 mg | ORAL_TABLET | Freq: Four times a day (QID) | ORAL | 0 refills | Status: DC | PRN

## 2017-02-22 NOTE — Discharge Summary (Signed)
Physician Discharge Summary [facility]  Patient ID: [name] MRN: [id number] DOB/AGE: 1941/04/25 76 y.o.  Admit date: 02/21/2017 Discharge date: 02/22/2017  Admission Diagnoses:  Chronic cholecystitis, cholelithiasis  Discharge Diagnoses:  Principal Problem:   Cholelithiasis with chronic cholecystitis   Discharged Condition: good  Hospital Course: Patient was admitted for observation following gallbladder surgery.  Post op course was uncomplicated.  Pain was well controlled.  Tolerated diet.  Patient was prepared for discharge home on POD#1.  Consults: None  Treatments: surgery: lap chole with IOC  Discharge Exam: Blood pressure (!) 148/78, pulse 63, temperature 98.2 F (36.8 C), temperature source Oral, resp. rate 16, height 5' 6"  (1.676 m), weight 63.4 kg (139 lb 12.8 oz), SpO2 98 %. HEENT - clear Neck - soft Chest - clear bilaterally Cor - RRR Abd - dressings dry and intact; soft; minimal tenderness  Disposition: Home  Discharge Instructions    Diet - low sodium heart healthy    Complete by:  As directed    Discharge instructions    Complete by:  As directed    [facility]  LAPAROSCOPIC SURGERY:  POST-OP INSTRUCTIONS  Always review your discharge instruction sheet given to you by the facility where your surgery was performed.  A prescription for pain medication may be given to you upon discharge.  Take your pain medication as prescribed.  If narcotic pain medicine is not needed, then you may take acetaminophen (Tylenol) or ibuprofen (Advil) as needed.  Take your usually prescribed medications unless otherwise directed.  If you need a refill on your pain medication, please contact your pharmacy.  They will contact our office to request authorization. Prescriptions will not be filled after 5 P.M. or on weekends.  You should follow a light diet the first few days after arrival home, such as soup and crackers or  toast.  Be sure to include plenty of fluids daily.  Most patients will experience some swelling and bruising in the area of the incisions.  Ice packs will help.  Swelling and bruising can take several days to resolve.   It is common to experience some constipation after surgery.  Increasing fluid intake and taking a stool softener (such as Colace) will usually help or prevent this problem from occurring.  A mild laxative (Milk of Magnesia or Miralax) should be taken according to package instructions if there has been no bowel movement after 48 hours.  You will have steri-strips and a gauze dressing over your incisions.  You may remove the gauze bandage on the second day after surgery, and you may shower at that time.  Leave your steri-strips (small skin tapes) in place directly over the incision.  These strips should remain on the skin for 5-7 days and then be removed.  You may get them wet in the shower and pat them dry.  Any sutures or staples will be removed at the office during your follow-up visit.  ACTIVITIES:  You may resume regular (light) daily activities beginning the next day - such as daily self-care, walking, climbing stairs - gradually increasing activities as tolerated.  You may have sexual intercourse when it is comfortable.  Refrain from any heavy lifting or straining until approved by your doctor.  You may drive when you are no longer taking prescription pain medication, you can comfortably wear a seatbelt, and you can safely maneuver your car and apply brakes.  You should see your doctor in the office for a follow-up  appointment approximately 2-3 weeks after your surgery.  Make sure that you call for this appointment within a day or two after you arrive home to insure a convenient appointment time.  WHEN TO CALL YOUR DOCTOR: Fever over 101.0 Inability to urinate Continued bleeding from incision Increased pain, redness, or drainage from the incision Increasing abdominal  pain  The clinic staff is available to answer your questions during regular business hours.  Please don't hesitate to call and ask to speak to one of the nurses for clinical concerns.  If you have a medical emergency, go to the nearest emergency room or call 911.  A surgeon from [facility] is always on call for the hospital.  [name], MD, [facility] Office: [place] [facility]:  [place] [phone]  Website: [email or web address]   Increase activity slowly    Complete by:  As directed    Remove dressing in 24 hours    Complete by:  As directed      Allergies as of 02/22/2017      Reactions   Hydrocodone Other (See Comments)   Rebound headaches   Oxycodone Other (See Comments)   Rebound headaches   Sulfamethoxazole-trimethoprim    REACTION: unspecified      Medication List    TAKE these medications   albuterol 108 (90 Base) MCG/ACT inhaler Commonly known as:  PROVENTIL HFA;VENTOLIN HFA Inhale 2 puffs into the lungs every 6 (six) hours as needed.   amLODipine 10 MG tablet Commonly known as:  NORVASC TAKE ONE TABLET BY MOUTH ONCE DAILY What changed:  See the new instructions.   ASCOMP-CODEINE 50-325-40-30 MG capsule Generic drug:  butalbital-aspirin-caffeine-codeine TAKE 1-2 CAPSULES BY MOUTH EVERY FOUR TO SIX HOURS AS NEEDED FOR MIGRAINE MAX OF 6 CAPSULES IN 24 HOURS   benazepril 20 MG tablet Commonly known as:  LOTENSIN TAKE 1 TABLET BY MOUTH ONCE DAILY   budesonide-formoterol 160-4.5 MCG/ACT inhaler Commonly known as:  SYMBICORT Inhale 2 puffs into the lungs 2 (two) times daily. What changed:  when to take this  reasons to take this   chlorthalidone 25 MG tablet Commonly known as:  HYGROTON TAKE 1 TABLET BY MOUTH ONCE DAILY What changed:  See the new instructions.   conjugated estrogens vaginal cream Commonly known as:  PREMARIN Place 1 Applicatorful vaginally 2 (two) times a week.    EPITOL 200 MG tablet Generic drug:  carbamazepine TAKE TWO TABLETS BY MOUTH TWICE DAILY   fluticasone 50 MCG/ACT nasal spray Commonly known as:  FLONASE Place into both nostrils as needed for allergies or rhinitis.   metoprolol tartrate 50 MG tablet Commonly known as:  LOPRESSOR Take 0.5 tablets (25 mg total) by mouth 2 (two) times daily.   RESTASIS 0.05 % ophthalmic emulsion Generic drug:  cycloSPORINE Place 1 drop into both eyes 2 (two) times daily.   traMADol 50 MG tablet Commonly known as:  ULTRAM Take 1 tablet (50 mg total) by mouth every 6 (six) hours as needed for moderate pain (mild pain).   ZANTAC 150 MG tablet Generic drug:  ranitidine Take 1 tablet by mouth daily as needed.        [name], MD, [facility] Office: [phone]   Signed: [name] 02/22/2017, 9:00 AM

## 2017-02-22 NOTE — Progress Notes (Signed)
Pt was discharged home today. Instructions were reviewed with patient,prescriptions were given to patient and questions were answered. Pt was taken to main entrance via wheelchair by NT.

## 2017-03-11 ENCOUNTER — Other Ambulatory Visit: Payer: Self-pay | Admitting: Internal Medicine

## 2017-03-29 ENCOUNTER — Encounter: Payer: Medicare Other | Admitting: Internal Medicine

## 2017-05-03 ENCOUNTER — Other Ambulatory Visit: Payer: Self-pay | Admitting: Internal Medicine

## 2017-05-22 NOTE — Progress Notes (Signed)
Chief Complaint  Patient presents with  . CPE    Pt is NOT fasting. No medications needed refills today. Pt would like to discuss pain issues.     HPI: [name] 76 y.o. comes in today for Preventive Medicare exam/ wellness visit  And Chronic disease management  However she has a number of concerns.  Continued right upper quadrant sometimes radiating to the back discomfort tenderness to touch.  Is waiting to feel better.  Some weight loss no fever  .Since last visit. Had gb surgery   October   Better than   Was.    Under rib  Has  Tenderness    Still using pillo w.  And also back pain   Worse than before surgery .    Recurrent headaches is decided to not follow-up with neurology because of concerns about the medicines not working anyway so she is managing them herself.  Still gets  Has uses coffee  .  And not the meds  Cause of concern about se and working .    Trigeminal neuralgia she has been taking Epitol to 200 mg twice a day and then about 4-5 weeks ago increased it to 500 mg twice a day because she was having some breakthrough pain and got concerned about it.   No recent falling but does state that she is having more problems with her balance and blurry vision without diplopia or focal weakness injury.  She is going to have an eye doctor appointment.  She is not able to time this related to her Epitol increase.   She sometimes gets a stomachache since her surgery in the morning takes ranitidine as needed which seems to help.  Her blood pressure seems to be okay no syncope no change in medicine.  She has a hearing aid on the right seems to help some.  She states that her memory is slowly getting worse and she is not real concerned her family is supportive.  She has most problems with name finding such as naming a bloody Stanton Kidney or persons name that eventually comes to her.  She is able to do financial bill paying without difficulty.      Health Maintenance    Topic Date Due  . DEXA SCAN  07/14/2004  . TETANUS/TDAP  09/18/2021  . INFLUENZA VACCINE  Completed  . PNA vac Low Risk Adult  Completed   Health Maintenance Review LIFESTYLE:  Exercise:  No now  Tobacco/ETS: no Alcohol:  1-2 per month  Sugar beverages:  Once in a while .  Sleep:    4 - 10 hours .  Varies    Wakening and up and down  Drug use: no HH:    2    Hearing:  Aids      Vision:  No limitations at present . Last eye check UTD  Safety:  Has smoke detector and wears seat belts.  No firearms. No excess sun exposure. Sees dentist regularly.  Falls:  Not recently   Memory: Felt to off per family names  But  Not serious   .   ?    Depression: No anhedonia unusual crying or depressive symptoms  Family  Situations   Nutrition: Eats well balanced diet; adequate calcium and vitamin D. No swallowing chewing problems.  Injury: no major injuries in the last six months.  Other healthcare providers:  Reviewed today .  Social:  Lives with spouse married.    Preventive parameters: up-to-date  Reviewed   ADLS:   There are no problems or nee feed for assistance  driving, feeding, obtaining food, dressing, toileting and bathing, managing money using phone. She is independent.    ROS:  See above   In hpi otherwise  Feet always cold but no lesion  GEN/ HEENT: No fever, significant weight changes sweats  hearing changes, CV/ PULM; No chest pain shortness of breath cough, syncope,edema  change in exercise tolerance. GI /GU: No vomiting, change in bowel habits. No blood in the stool. No significant GU symptoms. SKIN/HEME: ,no acute skin rashes suspicious lesions or bleeding. No lymphadenopathy, nodules, masses.  NEURO/ PSYCH:  No neurologic signs such as weakness numbness. No depression anxiety. IMM/ Allergy: No unusual infections.  Allergy .   REST of 12 system review negative except as per HPI   Past Medical History:  Diagnosis Date  . ADJ DISORDER WITH MIXED ANXIETY &  DEPRESSED MOOD 03/03/2010   Qualifier: Diagnosis of  By: [name], [name]   . Closed head injury 02/01/2011   from syncope and had scalp laceration  neg ct .    Marland Kitchen Closed head injury 5-6 yrs ago  . Complication of anesthesia    migraine several hours after general anesthesia  . Fatty liver   . Gall stones 2016   see ct scan neg HIDA   . GERD (gastroesophageal reflux disease)   . Hearing aid worn   . HOH (hard of hearing)    both ears  . Hyperlipidemia   . Hypertension    echo nl lv function  mild dilitation 2009  . Kidney infection    few yrs ago in hospital  . Medication side effect 09/02/2010   Poss muscle se of 10 crestor   . Migraine    hypnic HA eval by Dr. Earley Favor in the past  . Polycythemia   . 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 Syncope 02/01/2011   In shower on vacation  sustained head laceration  8 sutures Had ed visit neg head ct labs and x ray   . Trigeminal neuralgia pain     Family History  Problem Relation Age of Onset  . Ovarian cancer Mother   . Stroke Mother   . Alcohol abuse Father   . Stroke Father   . Diabetes Brother   . Cancer Paternal Aunt        leukemia, unknown type  . Seizures Daughter   . Hypertension Unknown   . Colon cancer Neg Hx     Social History   Socioeconomic History  . Marital status: Married    Spouse name: None  . Number of children: 2  . Years of education: None  . Highest education level: None  Social Needs  . Financial resource strain: None  . Food insecurity - worry: None  . Food insecurity - inability: None  . Transportation needs - medical: None  . Transportation needs - non-medical: None  Occupational History    Comment: retired Forensic psychologist  Tobacco Use  . Smoking status: Never Smoker  . Smokeless tobacco: Never Used  Substance and Sexual Activity  . Alcohol use: Yes    Alcohol/week: 1.2 oz    Types: 2 Glasses of wine per week     Comment: occ wine  . Drug use: No  . Sexual activity: None  Other Topics Concern  . None  Social  History Narrative   Married   HH of 2-3 (god daughter)   Pets 2 dogs   Non smoker    Child is a physician   G2P2             Outpatient Encounter Medications as of 05/24/2017  Medication Sig  . albuterol (PROVENTIL HFA;VENTOLIN HFA) 108 (90 Base) MCG/ACT inhaler Inhale 2 puffs into the lungs every 6 (six) hours as needed.  Marland Kitchen amLODipine (NORVASC) 10 MG tablet TAKE 1 TABLET BY MOUTH ONCE DAILY  . ASCOMP-CODEINE 50-325-40-30 MG capsule TAKE 1-2 CAPSULES BY MOUTH EVERY FOUR TO SIX HOURS AS NEEDED FOR MIGRAINE MAX OF 6 CAPSULES IN 24 HOURS  . benazepril (LOTENSIN) 20 MG tablet TAKE 1 TABLET BY MOUTH ONCE DAILY  . budesonide-formoterol (SYMBICORT) 160-4.5 MCG/ACT inhaler Inhale 2 puffs into the lungs 2 (two) times daily. (Patient taking differently: Inhale 2 puffs into the lungs 2 (two) times daily as needed. )  . chlorthalidone (HYGROTON) 25 MG tablet TAKE 1 TABLET BY MOUTH ONCE DAILY (Patient taking differently: TAKE 1 TABLET BY MOUTH ONCE DAILY in evening)  . conjugated estrogens (PREMARIN) vaginal cream Place 1 Applicatorful vaginally 2 (two) times a week.  . EPITOL 200 MG tablet TAKE TWO TABLETS BY MOUTH TWICE DAILY (Patient taking differently: TAKE TWO TABLETS and a half BY MOUTH TWICE DAILY)  . fluticasone (FLONASE) 50 MCG/ACT nasal spray Place into both nostrils as needed for allergies or rhinitis.  . metoprolol tartrate (LOPRESSOR) 50 MG tablet Take 0.5 tablets (25 mg total) by mouth 2 (two) times daily.  . ranitidine (ZANTAC) 150 MG tablet Take 1 tablet by mouth daily as needed.   . RESTASIS 0.05 % ophthalmic emulsion Place 1 drop into both eyes 2 (two) times daily.   . traMADol (ULTRAM) 50 MG tablet Take 1 tablet (50 mg total) by mouth every 6 (six) hours as needed for moderate pain (mild pain).   No facility-administered encounter medications on file as of 05/24/2017.     EXAM:  BP  120/80 (BP Location: Left Arm, Patient Position: Sitting, Cuff Size: Normal)   Pulse (!) 56   Temp 97.7 F (36.5 C) (Oral)   Ht 5' 5.25" (1.657 m)   Wt 136 lb 6.4 oz (61.9 kg)   SpO2 97%   BMI 22.52 kg/m   Body mass index is 22.52 kg/m. P pulse rate was 60 and regular Physical Exam: Vital signs reviewed OVZ:CHYI is a well-developed well-nourished alert cooperative   who appears stated age in no acute distress.  HEENT: normocephalic atraumatic , Eyes: PERRL EOM's full,  No ob nystagmus conjunctiva clear, Nares: paten,t no deformity discharge or tenderness., Ears: no deformity EAC's clear TMs with normal landmarks. Mouth: clear OP, no lesions, edema.  Moist mucous membranes. Dentition in adequate repair. NECK: supple without masses, thyromegaly or bruits. CHEST/PULM:  Clear to auscultation and percussion breath sounds equal no wheeze , rales or rhonchi. No chest wall deformities or tenderness. CV: PMI is nondisplaced, S1 S2 no gallops, murmurs, rubs. Peripheral pulses are full without delay.No JVD .  ABDOMEN: Bowel sounds normal nontender  No guard or rebound, no hepato splenomegal no CVA tenderness.   Healed scar  Right  Lap  Tender near ant mid rib  But no lesion  Extremtities:  No clubbing cyanosis or edema, no acute joint swelling or redness no focal atrophy NEURO:  Oriented x3, cranial nerves 3-12 appear to be intact, no obvious focal weakness,gait within normal limits no abnormal reflexes or  asymmetrical no wide based gait but caution on arising to table  SKIN: No acute rashes normal turgor, color, no bruising or petechiae.  Distal toes    Cool ruddy  And pink dusky but nl cap refill and pulse intact  PSYCH: Oriented, good eye contact, no obvious depression anxiety, cognition and judgment appear normal. But not formally tested  LN: no cervical axillary i adenopathy No noted deficits in , attention, and speech.   Lab Results  Component Value Date   WBC 4.1 02/20/2017   HGB 14.9  02/20/2017   HCT 42.1 02/20/2017   PLT 200 02/20/2017   GLUCOSE 100 (H) 02/20/2017   CHOL 234 (H) 04/19/2016   TRIG 59.0 04/19/2016   HDL 93.50 04/19/2016   LDLDIRECT 135.9 10/16/2012   LDLCALC 129 (H) 04/19/2016   ALT 18 04/19/2016   AST 16 04/19/2016   NA 133 (L) 02/20/2017   K 3.8 02/20/2017   CL 98 (L) 02/20/2017   CREATININE 0.73 02/20/2017   BUN 18 02/20/2017   CO2 26 02/20/2017   TSH 0.86 04/19/2016   INR 1.23 03/12/2015  No results found for: VITAMINB12 Wt Readings from Last 3 Encounters:  05/24/17 136 lb 6.4 oz (61.9 kg)  02/21/17 139 lb 12.8 oz (63.4 kg)  02/20/17 139 lb 12.8 oz (63.4 kg)   BP Readings from Last 3 Encounters:  05/24/17 120/80  02/22/17 (!) 148/78  02/20/17 (!) 127/59     ASSESSMENT AND PLAN:  Discussed the following assessment and plan:  Visit for preventive health examination  Essential hypertension - Plan: Comprehensive metabolic panel, TSH, Vitamin B12, Carbamazepine, Free and Total, Lipid panel, T4, free, CBC with Differential/Platelet  Medication management - Plan: Comprehensive metabolic panel, TSH, Vitamin B12, Carbamazepine, Free and Total, Lipid panel, T4, free, CBC with Differential/Platelet  Hyperlipidemia, unspecified hyperlipidemia type - Plan: Comprehensive metabolic panel, TSH, Vitamin B12, Carbamazepine, Free and Total, Lipid panel, T4, free, CBC with Differential/Platelet  Chronic recurrent am  nonintractable headache, - Plan: Comprehensive metabolic panel, TSH, Vitamin B12, Carbamazepine, Free and Total, Lipid panel, T4, free, CBC with Differential/Platelet  History of recurrent UTI (urinary tract infection)  Imbalance - Plan: Comprehensive metabolic panel, TSH, Vitamin B12, Carbamazepine, Free and Total, Lipid panel, T4, free, CBC with Differential/Platelet  Memory difficulty - Plan: Comprehensive metabolic panel, TSH, Vitamin B12, Carbamazepine, Free and Total, Lipid panel, T4, free, CBC with  Differential/Platelet  Medication monitoring encounter - need trough level of  tegretol. as she inc med herseolf to 500 mg bid  - Plan: Comprehensive metabolic panel, TSH, Vitamin B12, Carbamazepine, Free and Total, Lipid panel, T4, free, CBC with Differential/Platelet  Right upper quadrant abdominal pain of unknown etiology - tender at rib care post choley  Multiple concerns today but the unsteadiness and intermittent visual blurriness is somewhat new.  She does have remote history of falling but this is different.  Important to check a trough level on her carbamazepine because she increased the dose on her own concern about breakthrough pain from her trigeminal neuralgia.  And there is can certainly cause the symptoms she is discussing.  She is also had a history of hyponatremia we will check this at her labs. In regard to her mild memory issues and other symptoms we will be checking a B12 level also.  As well as thyroid.  dvise try ranitidine  Hs in stead of in am  Plan fasting lab work preferred but definitely a trough level for her carbamazepine a.m. then plan follow-up visit  after seeing the eye doctor and lab results back. Patient Care Team: [name], MD as PCP - General Cindie Crumbly (Neurosurgery) Irine Seal, MD as Attending Physician (Urology) Pieter Partridge, DO as Consulting Physician (Neurology)  Patient Instructions  There could be many reasons for the imbalance feeling but I want to check a drug level of your Epitol.  Trough level.  Make an early a.m. appointment and do not take the Epitol until after the blood is drawn.  Prefer this be a fasting blood draw. Then we will plan follow-up after that I agree with seeing your eye doctor. It is possible that you are on too high dose of an Epitol at this time. Try taking the ranitidine before you go to bed at night to prevent stomachaches. Your blood pressure is in a good range and at this time I do not think the medication is  causing her symptoms. We may consider adjusting her medicine at some point.   Preventive Care 23 Years and Older, Female Preventive care refers to lifestyle choices and visits with your health care provider that can promote health and wellness. What does preventive care include?  A yearly physical exam. This is also called an annual well check.  Dental exams once or twice a year.  Routine eye exams. Ask your health care provider how often you should have your eyes checked.  Personal lifestyle choices, including: ? Daily care of your teeth and gums. ? Regular physical activity. ? Eating a healthy diet. ? Avoiding tobacco and drug use. ? Limiting alcohol use. ? Practicing safe sex. ? Taking low-dose aspirin every day. ? Taking vitamin and mineral supplements as recommended by your health care provider. What happens during an annual well check? The services and screenings done by your health care provider during your annual well check will depend on your age, overall health, lifestyle risk factors, and family history of disease. Counseling Your health care provider may ask you questions about your:  Alcohol use.  Tobacco use.  Drug use.  Emotional well-being.  Home and relationship well-being.  Sexual activity.  Eating habits.  History of falls.  Memory and ability to understand (cognition).  Work and work Statistician.  Reproductive health.  Screening You may have the following tests or measurements:  Height, weight, and BMI.  Blood pressure.  Lipid and cholesterol levels. These may be checked every 5 years, or more frequently if you are over 67 years old.  Skin check.  Lung cancer screening. You may have this screening every year starting at age 77 if you have a 30-pack-year history of smoking and currently smoke or have quit within the past 15 years.  Fecal occult blood test (FOBT) of the stool. You may have this test every year starting at age  73.  Flexible sigmoidoscopy or colonoscopy. You may have a sigmoidoscopy every 5 years or a colonoscopy every 10 years starting at age 29.  Hepatitis C blood test.  Hepatitis B blood test.  Sexually transmitted disease (STD) testing.  Diabetes screening. This is done by checking your blood sugar (glucose) after you have not eaten for a while (fasting). You may have this done every 1-3 years.  Bone density scan. This is done to screen for osteoporosis. You may have this done starting at age 37.  Mammogram. This may be done every 1-2 years. Talk to your health care provider about how often you should have regular mammograms.  Talk with your health care provider about  your test results, treatment options, and if necessary, the need for more tests. Vaccines Your health care provider may recommend certain vaccines, such as:  Influenza vaccine. This is recommended every year.  Tetanus, diphtheria, and acellular pertussis (Tdap, Td) vaccine. You may need a Td booster every 10 years.  Varicella vaccine. You may need this if you have not been vaccinated.  Zoster vaccine. You may need this after age 66.  Measles, mumps, and rubella (MMR) vaccine. You may need at least one dose of MMR if you were born in 1957 or later. You may also need a second dose.  Pneumococcal 13-valent conjugate (PCV13) vaccine. One dose is recommended after age 2.  Pneumococcal polysaccharide (PPSV23) vaccine. One dose is recommended after age 54.  Meningococcal vaccine. You may need this if you have certain conditions.  Hepatitis A vaccine. You may need this if you have certain conditions or if you travel or work in places where you may be exposed to hepatitis A.  Hepatitis B vaccine. You may need this if you have certain conditions or if you travel or work in places where you may be exposed to hepatitis B.  Haemophilus influenzae type b (Hib) vaccine. You may need this if you have certain conditions.  Talk to  your health care provider about which screenings and vaccines you need and how often you need them. This information is not intended to replace advice given to you by your health care provider. Make sure you discuss any questions you have with your health care provider. Document Released: 06/05/2015 Document Revised: 01/27/2016 Document Reviewed: 03/10/2015 Elsevier Interactive Patient Education  2018 [employer]. Oliveah Zwack M.D.

## 2017-05-24 ENCOUNTER — Encounter: Payer: Self-pay | Admitting: Internal Medicine

## 2017-05-24 ENCOUNTER — Ambulatory Visit (INDEPENDENT_AMBULATORY_CARE_PROVIDER_SITE_OTHER): Payer: Medicare Other | Admitting: Internal Medicine

## 2017-05-24 VITALS — BP 120/80 | HR 56 | Temp 97.7°F | Ht 65.25 in | Wt 136.4 lb

## 2017-05-24 DIAGNOSIS — R1011 Right upper quadrant pain: Secondary | ICD-10-CM | POA: Diagnosis not present

## 2017-05-24 DIAGNOSIS — R519 Headache, unspecified: Secondary | ICD-10-CM

## 2017-05-24 DIAGNOSIS — R2689 Other abnormalities of gait and mobility: Secondary | ICD-10-CM | POA: Diagnosis not present

## 2017-05-24 DIAGNOSIS — Z5181 Encounter for therapeutic drug level monitoring: Secondary | ICD-10-CM | POA: Diagnosis not present

## 2017-05-24 DIAGNOSIS — I10 Essential (primary) hypertension: Secondary | ICD-10-CM

## 2017-05-24 DIAGNOSIS — Z87440 Personal history of urinary (tract) infections: Secondary | ICD-10-CM

## 2017-05-24 DIAGNOSIS — R51 Headache: Secondary | ICD-10-CM | POA: Diagnosis not present

## 2017-05-24 DIAGNOSIS — Z79899 Other long term (current) drug therapy: Secondary | ICD-10-CM | POA: Diagnosis not present

## 2017-05-24 DIAGNOSIS — Z0000 Encounter for general adult medical examination without abnormal findings: Secondary | ICD-10-CM

## 2017-05-24 DIAGNOSIS — Z0001 Encounter for general adult medical examination with abnormal findings: Secondary | ICD-10-CM | POA: Diagnosis not present

## 2017-05-24 DIAGNOSIS — E785 Hyperlipidemia, unspecified: Secondary | ICD-10-CM

## 2017-05-24 DIAGNOSIS — R413 Other amnesia: Secondary | ICD-10-CM | POA: Diagnosis not present

## 2017-05-24 DIAGNOSIS — G8929 Other chronic pain: Secondary | ICD-10-CM

## 2017-05-24 NOTE — Patient Instructions (Addendum)
There could be many reasons for the imbalance feeling but I want to check a drug level of your Epitol.  Trough level.  Make an early a.m. appointment and do not take the Epitol until after the blood is drawn.  Prefer this be a fasting blood draw. Then we will plan follow-up after that I agree with seeing your eye doctor. It is possible that you are on too high dose of an Epitol at this time. Try taking the ranitidine before you go to bed at night to prevent stomachaches. Your blood pressure is in a good range and at this time I do not think the medication is causing her symptoms. We may consider adjusting her medicine at some point.   Preventive Care 35 Years and Older, Female Preventive care refers to lifestyle choices and visits with your health care provider that can promote health and wellness. What does preventive care include?  A yearly physical exam. This is also called an annual well check.  Dental exams once or twice a year.  Routine eye exams. Ask your health care provider how often you should have your eyes checked.  Personal lifestyle choices, including: ? Daily care of your teeth and gums. ? Regular physical activity. ? Eating a healthy diet. ? Avoiding tobacco and drug use. ? Limiting alcohol use. ? Practicing safe sex. ? Taking low-dose aspirin every day. ? Taking vitamin and mineral supplements as recommended by your health care provider. What happens during an annual well check? The services and screenings done by your health care provider during your annual well check will depend on your age, overall health, lifestyle risk factors, and family history of disease. Counseling Your health care provider may ask you questions about your:  Alcohol use.  Tobacco use.  Drug use.  Emotional well-being.  Home and relationship well-being.  Sexual activity.  Eating habits.  History of falls.  Memory and ability to understand (cognition).  Work and work  Statistician.  Reproductive health.  Screening You may have the following tests or measurements:  Height, weight, and BMI.  Blood pressure.  Lipid and cholesterol levels. These may be checked every 5 years, or more frequently if you are over 69 years old.  Skin check.  Lung cancer screening. You may have this screening every year starting at age 68 if you have a 30-pack-year history of smoking and currently smoke or have quit within the past 15 years.  Fecal occult blood test (FOBT) of the stool. You may have this test every year starting at age 81.  Flexible sigmoidoscopy or colonoscopy. You may have a sigmoidoscopy every 5 years or a colonoscopy every 10 years starting at age 22.  Hepatitis C blood test.  Hepatitis B blood test.  Sexually transmitted disease (STD) testing.  Diabetes screening. This is done by checking your blood sugar (glucose) after you have not eaten for a while (fasting). You may have this done every 1-3 years.  Bone density scan. This is done to screen for osteoporosis. You may have this done starting at age 32.  Mammogram. This may be done every 1-2 years. Talk to your health care provider about how often you should have regular mammograms.  Talk with your health care provider about your test results, treatment options, and if necessary, the need for more tests. Vaccines Your health care provider may recommend certain vaccines, such as:  Influenza vaccine. This is recommended every year.  Tetanus, diphtheria, and acellular pertussis (Tdap, Td) vaccine. You may  need a Td booster every 10 years.  Varicella vaccine. You may need this if you have not been vaccinated.  Zoster vaccine. You may need this after age 1.  Measles, mumps, and rubella (MMR) vaccine. You may need at least one dose of MMR if you were born in 1957 or later. You may also need a second dose.  Pneumococcal 13-valent conjugate (PCV13) vaccine. One dose is recommended after age  58.  Pneumococcal polysaccharide (PPSV23) vaccine. One dose is recommended after age 37.  Meningococcal vaccine. You may need this if you have certain conditions.  Hepatitis A vaccine. You may need this if you have certain conditions or if you travel or work in places where you may be exposed to hepatitis A.  Hepatitis B vaccine. You may need this if you have certain conditions or if you travel or work in places where you may be exposed to hepatitis B.  Haemophilus influenzae type b (Hib) vaccine. You may need this if you have certain conditions.  Talk to your health care provider about which screenings and vaccines you need and how often you need them. This information is not intended to replace advice given to you by your health care provider. Make sure you discuss any questions you have with your health care provider. Document Released: 06/05/2015 Document Revised: 01/27/2016 Document Reviewed: 03/10/2015 Elsevier Interactive Patient Education  [employer].

## 2017-05-30 ENCOUNTER — Other Ambulatory Visit (INDEPENDENT_AMBULATORY_CARE_PROVIDER_SITE_OTHER): Payer: Medicare Other

## 2017-05-30 ENCOUNTER — Other Ambulatory Visit: Payer: Self-pay | Admitting: Internal Medicine

## 2017-05-30 DIAGNOSIS — R413 Other amnesia: Secondary | ICD-10-CM

## 2017-05-30 DIAGNOSIS — G8929 Other chronic pain: Secondary | ICD-10-CM

## 2017-05-30 DIAGNOSIS — E785 Hyperlipidemia, unspecified: Secondary | ICD-10-CM | POA: Diagnosis not present

## 2017-05-30 DIAGNOSIS — Z5181 Encounter for therapeutic drug level monitoring: Secondary | ICD-10-CM | POA: Diagnosis not present

## 2017-05-30 DIAGNOSIS — R51 Headache: Secondary | ICD-10-CM | POA: Diagnosis not present

## 2017-05-30 DIAGNOSIS — I10 Essential (primary) hypertension: Secondary | ICD-10-CM

## 2017-05-30 DIAGNOSIS — R2689 Other abnormalities of gait and mobility: Secondary | ICD-10-CM | POA: Diagnosis not present

## 2017-05-30 DIAGNOSIS — Z79899 Other long term (current) drug therapy: Secondary | ICD-10-CM | POA: Diagnosis not present

## 2017-05-30 LAB — LIPID PANEL
Cholesterol: 233 mg/dL — ABNORMAL HIGH (ref 0–200)
HDL: 92 mg/dL (ref >39.00)
LDL Cholesterol: 129 mg/dL — ABNORMAL HIGH (ref 0–99)
NonHDL: 141.06
Total CHOL/HDL Ratio: 3
Triglycerides: 61 mg/dL (ref 0.0–149.0)
VLDL: 12.2 mg/dL (ref 0.0–40.0)

## 2017-05-30 LAB — CBC WITH DIFFERENTIAL/PLATELET
Basophils Absolute: 0.1 10*3/uL (ref 0.0–0.1)
Basophils Relative: 1.3 % (ref 0.0–3.0)
Eosinophils Absolute: 0.2 10*3/uL (ref 0.0–0.7)
Eosinophils Relative: 5.8 % — ABNORMAL HIGH (ref 0.0–5.0)
HCT: 46.5 % — ABNORMAL HIGH (ref 36.0–46.0)
Hemoglobin: 15.6 g/dL — ABNORMAL HIGH (ref 12.0–15.0)
Lymphocytes Relative: 25.6 % (ref 12.0–46.0)
Lymphs Abs: 1 10*3/uL (ref 0.7–4.0)
MCHC: 33.6 g/dL (ref 30.0–36.0)
MCV: 92.5 fl (ref 78.0–100.0)
Monocytes Absolute: 0.3 10*3/uL (ref 0.1–1.0)
Monocytes Relative: 8.4 % (ref 3.0–12.0)
Neutro Abs: 2.4 10*3/uL (ref 1.4–7.7)
Neutrophils Relative %: 58.9 % (ref 43.0–77.0)
Platelets: 213 10*3/uL (ref 150.0–400.0)
RBC: 5.03 Mil/uL (ref 3.87–5.11)
RDW: 13 % (ref 11.5–15.5)
WBC: 4 10*3/uL (ref 4.0–10.5)

## 2017-05-30 LAB — COMPREHENSIVE METABOLIC PANEL
ALT: 17 U/L (ref 0–35)
AST: 17 U/L (ref 0–37)
Albumin: 3.9 g/dL (ref 3.5–5.2)
Alkaline Phosphatase: 72 U/L (ref 39–117)
BUN: 20 mg/dL (ref 6–23)
CO2: 34 mEq/L — ABNORMAL HIGH (ref 19–32)
Calcium: 9.3 mg/dL (ref 8.4–10.5)
Chloride: 98 mEq/L (ref 96–112)
Creatinine, Ser: 0.83 mg/dL (ref 0.40–1.20)
GFR: 70.69 mL/min (ref >60.00)
Glucose, Bld: 106 mg/dL — ABNORMAL HIGH (ref 70–99)
Potassium: 4.3 mEq/L (ref 3.5–5.1)
Sodium: 138 mEq/L (ref 135–145)
Total Bilirubin: 0.6 mg/dL (ref 0.2–1.2)
Total Protein: 5.8 g/dL — ABNORMAL LOW (ref 6.0–8.3)

## 2017-05-30 LAB — VITAMIN B12: Vitamin B-12: 825 pg/mL (ref 211–911)

## 2017-05-30 LAB — T4, FREE: Free T4: 0.74 ng/dL (ref 0.60–1.60)

## 2017-05-30 LAB — TSH: TSH: 1.37 u[IU]/mL (ref 0.35–4.50)

## 2017-05-31 NOTE — Telephone Encounter (Signed)
Epitol last filled 08/29/16, #360 x 1 rf Last seen 05/24/17 Please advise Dr Regis Bill, thanks.

## 2017-06-01 ENCOUNTER — Encounter: Payer: Self-pay | Admitting: Internal Medicine

## 2017-06-01 NOTE — Telephone Encounter (Signed)
Can refill x 90 days   Still waiting on the drug  level;result

## 2017-06-02 ENCOUNTER — Encounter: Payer: Self-pay | Admitting: Internal Medicine

## 2017-06-02 LAB — CARBAMAZEPINE, FREE AND TOTAL
CARBAMAZEPINE METABOLITE, FREE: 2.5 ug/mL — ABNORMAL HIGH (ref 0.1–1.0)
CARBAMAZEPINE METABOLITE, TOTAL: 4 ug/mL — ABNORMAL HIGH (ref 0.2–2.0)
CARBAMAZEPINE, TOTAL: 9.7 ug/mL (ref 4.0–12.0)
Carbamazepine Metabolite/: 1.5 ug/mL
Carbamazepine, Bound: 7.3 ug/mL
Carbamazepine, Free: 2.4 ug/mL (ref 1.0–3.0)

## 2017-06-02 NOTE — Telephone Encounter (Signed)
Done

## 2017-06-02 NOTE — Telephone Encounter (Signed)
  See result note: All of Blood tests finally came back       carbamazepine levels   Show increased metabolites of medication      This is possible to  Cause/ the symptoms but not sure   How much  I dont feel comfortable having you at the increased dose  If feeling dizzy and eye sx      Perhaps take     Dosing  Every 8 hours instead of twice a day   Such as 400  200  400    Could be tried  I  Think we need some neurology input  About  Medication  And pain control    ? Fu visit  To discuss after trying this regiment

## 2017-06-03 ENCOUNTER — Other Ambulatory Visit: Payer: Self-pay | Admitting: Internal Medicine

## 2017-06-05 NOTE — Telephone Encounter (Signed)
Dr Regis Bill please see patient's email. Thanks.

## 2017-06-08 NOTE — Telephone Encounter (Signed)
Epitol / Tegretol 267m Last filled 05/30/16, #360 x 1 rf Seen recently 05/24/17  Please advise Dr PRegis Bill thanks.

## 2017-06-15 NOTE — Telephone Encounter (Signed)
Apologies about delayed response   I was thinking about what else we can do to help.   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  Dentist and oral surgeons  sometimes can help with  With bite guards if   malocclusion a problem  Along with teeth grinding. However other than that not sure  .   You can get arthritis of that joint  But not an easy answer for treatment .       Do you want to see you r dentist or oral surgeon  ?  To see what they think?

## 2017-06-26 DIAGNOSIS — Z1231 Encounter for screening mammogram for malignant neoplasm of breast: Secondary | ICD-10-CM | POA: Diagnosis not present

## 2017-07-17 ENCOUNTER — Telehealth: Payer: Self-pay | Admitting: Internal Medicine

## 2017-07-17 ENCOUNTER — Ambulatory Visit: Payer: Self-pay

## 2017-07-17 NOTE — Telephone Encounter (Addendum)
Pt calling regarding rectal bleeding that occurred 07/13/17. Pt had just returned from being Pt states she began to have severe abdominal cramping, feeling faint or unsteady. Then she had an episode of rectal bleeding that she approximates  1 cup of bright red blood. The next day she had another episode that occurs but it was not as much blood and it had clots in it. Pt states she has been taking Ibuprofen over a month due to a bad tooth.  No 30 minute appt seen. Geralynn Ochs at PCP office and updated her on call. Pt has an appt 1115 tomorrow. Had originally scheduled appt for 07/26/17 at 1515, but PCP worked pt in for tomorrow. Called and left messages to home and cell phone.  Reason for Disposition . MODERATE rectal bleeding (small blood clots, passing blood without stool, or toilet water turns red)    Occurred last Thursday 07/13/17.  Answer Assessment - Initial Assessment Questions 1. APPEARANCE of BLOOD: "What color is it?" "Is it passed separately, on the surface of the stool, or mixed in with the stool?"      No stool just bright red blood 2. AMOUNT: "How much blood was passed?"      approx. 1 cup 3. FREQUENCY: "How many times has blood been passed with the stools?"  3-4 4. ONSET: "When was the blood first seen in the stools?" (Days or weeks)      Last Thursday 5. DIARRHEA: "Is there also some diarrhea?" If so, ask: "How many diarrhea stools were passed in past 24 hours?"      No diarrhea 6. CONSTIPATION: "Do you have constipation?" If so, "How bad is it?"     no 7. RECURRENT SYMPTOMS: "Have you had blood in your stools before?" If so, ask: "When was the last time?" and "What happened that time?"      no 8. BLOOD THINNERS: "Do you take any blood thinners?" (e.g., Coumadin/warfarin, Pradaxa/dabigatran, aspirin)     no 9. OTHER SYMPTOMS: "Do you have any other symptoms?"  (e.g., abdominal pain, vomiting, dizziness, fever)     no 10. PREGNANCY: "Is there any chance you are pregnant?" "When  was your last menstrual period?"       n/a  Protocols used: RECTAL BLEEDING-A-AH

## 2017-07-17 NOTE — Telephone Encounter (Signed)
Called no answer on home number- left message on cell phone with appt changed to tomorrow 07/18/17 @ 1115. Asked pt to call office to confirm she has been notified.

## 2017-07-17 NOTE — Telephone Encounter (Signed)
Called pt and updated her on her rescheduled appt for tomorrow. BP 159/84 Pt has seen Dr. Silvano Rusk 04/01/14

## 2017-07-17 NOTE — Progress Notes (Signed)
Chief Complaint  Patient presents with  . Rectal Bleeding    Pt states this has subsided    HPI: [name] 77 y.o. come in for  episodes last week of rectal bleeding     Out of country last week at a conference   Onset with  Acute  Lower abd cramps  Pain.  Wed feb 20  And   Had felt faint cause of pain  So bad . And then   When went  br had  Lot of blood and bright red .    Getting less  Each time .   4-5  Toilet trips    By end of day     Was gone  And was tired  Was out of town [place]    And slept    Since then no recurrent and has had nl bms  No abd pain .  No syncope.  She had been  Taking lots of ibuprofen cause  of the hewer face pain     So stopped .  Saw dentist and specialist periodont and no cause of pain found.   Describes  Ans right jaw achy a lot but when happens  Shooting like nerve pain  righ lower cheek  And she can feel trigger if touches right side of nose  Tip . Asks if this could be tmj  But not worse with chewing?   She has dec the tegretol dose cause had se of meds     ROS: See pertinent positives and negatives per HPI. No syncope cp sob   Past Medical History:  Diagnosis Date  . ADJ DISORDER WITH MIXED ANXIETY & DEPRESSED MOOD 03/03/2010   Qualifier: Diagnosis of  By: [name], [name]   . Closed head injury 02/01/2011   from syncope and had scalp laceration  neg ct .    Marland Kitchen Closed head injury 5-6 yrs ago  . Complication of anesthesia    migraine several hours after general anesthesia  . Fatty liver   . Gall stones 2016   see ct scan neg HIDA   . GERD (gastroesophageal reflux disease)   . Hearing aid worn   . HOH (hard of hearing)    both ears  . Hyperlipidemia   . Hypertension    echo nl lv function  mild dilitation 2009  . Kidney infection    few yrs ago in hospital  . Medication side effect 09/02/2010   Poss muscle se of 10 crestor   . Migraine    hypnic HA eval by Dr. Earley Favor in the past  . Polycythemia   . 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 Syncope 02/01/2011   In shower on vacation  sustained head laceration  8 sutures Had ed visit neg head ct labs and x ray   . Trigeminal neuralgia pain     Family History  Problem Relation Age of Onset  . Ovarian cancer Mother   . Stroke Mother   . Alcohol abuse Father   . Stroke Father   . Diabetes Brother   . Cancer Paternal Aunt        leukemia, unknown type  . Seizures Daughter   . Hypertension Unknown   . Colon cancer Neg Hx     Social History   Socioeconomic History  . Marital status: Married    Spouse name: None  .  Number of children: 2  . Years of education: None  . Highest education level: None  Social Needs  . Financial resource strain: None  . Food insecurity - worry: None  . Food insecurity - inability: None  . Transportation needs - medical: None  . Transportation needs - non-medical: None  Occupational History    Comment: retired Forensic psychologist  Tobacco Use  . Smoking status: Never Smoker  . Smokeless tobacco: Never Used  Substance and Sexual Activity  . Alcohol use: Yes    Alcohol/week: 1.2 oz    Types: 2 Glasses of wine per week    Comment: occ wine  . Drug use: No  . Sexual activity: None  Other Topics Concern  . None  Social History Narrative   Married   HH of 2-3 (god daughter)   Pets 2 dogs   Non smoker    Child is a physician   G2P2             Outpatient Medications Prior to Visit  Medication Sig Dispense Refill  . albuterol (PROVENTIL HFA;VENTOLIN HFA) 108 (90 Base) MCG/ACT inhaler Inhale 2 puffs into the lungs every 6 (six) hours as needed. 1 Inhaler 2  . amLODipine (NORVASC) 10 MG tablet TAKE 1 TABLET BY MOUTH ONCE DAILY 90 tablet 1  . ASCOMP-CODEINE 50-325-40-30 MG capsule TAKE 1-2 CAPSULES BY MOUTH EVERY FOUR TO SIX HOURS AS NEEDED FOR MIGRAINE MAX OF 6 CAPSULES IN 24 HOURS 30 capsule 0  . benazepril (LOTENSIN) 20 MG tablet TAKE 1 TABLET BY  MOUTH ONCE DAILY 90 tablet 1  . budesonide-formoterol (SYMBICORT) 160-4.5 MCG/ACT inhaler Inhale 2 puffs into the lungs 2 (two) times daily. (Patient taking differently: Inhale 2 puffs into the lungs 2 (two) times daily as needed. ) 1 Inhaler 2  . chlorthalidone (HYGROTON) 25 MG tablet TAKE 1 TABLET BY MOUTH ONCE DAILY (Patient taking differently: TAKE 1 TABLET BY MOUTH ONCE DAILY in evening) 90 tablet 1  . conjugated estrogens (PREMARIN) vaginal cream Place 1 Applicatorful vaginally 2 (two) times a week.    . EPITOL 200 MG tablet TAKE 2 TABLETS BY MOUTH TWICE DAILY 360 tablet 0  . metoprolol tartrate (LOPRESSOR) 50 MG tablet Take 0.5 tablets (25 mg total) by mouth 2 (two) times daily. 90 tablet 1  . ranitidine (ZANTAC) 150 MG tablet Take 1 tablet by mouth daily as needed.     . RESTASIS 0.05 % ophthalmic emulsion Place 1 drop into both eyes 2 (two) times daily.   1  . carbamazepine (TEGRETOL) 200 MG tablet TAKE 2 TABLETS BY MOUTH TWICE DAILY (Patient not taking: Reported on 07/18/2017) 360 tablet 1  . fluticasone (FLONASE) 50 MCG/ACT nasal spray Place into both nostrils as needed for allergies or rhinitis.    Marland Kitchen traMADol (ULTRAM) 50 MG tablet Take 1 tablet (50 mg total) by mouth every 6 (six) hours as needed for moderate pain (mild pain). (Patient not taking: Reported on 07/18/2017) 15 tablet 0   No facility-administered medications prior to visit.      EXAM:  BP 116/70 (BP Location: Right Arm, Patient Position: Sitting, Cuff Size: Normal)   Pulse 62   Temp (!) 97.5 F (36.4 C) (Oral)   Wt 138 lb (62.6 kg)   BMI 22.79 kg/m   Body mass index is 22.79 kg/m.  GENERAL: vitals reviewed and listed above, alert, oriented, appears well hydrated and in no acute distress HEENT: atraumatic, conjunctiva  clear, no obvious abnormalities on inspection of  external nose and ears  NECK: no obvious masses on inspection palpation  LUNGS: clear to auscultation bilaterally, no wheezes, rales or  rhonchi, CV: HRRR, no clubbing cyanosis or  peripheral edema nl cap refill  Skin no bruising bleeding  Abdomen:  Sof,t normal bowel sounds without hepatosplenomegaly, no guarding rebound or masses no CVA tenderness MS: moves all extremities without noticeable focal  abnormality PSYCH: pleasant and cooperative, no obvious depression or anxiety Lab Results  Component Value Date   WBC 4.6 07/18/2017   HGB 15.5 (H) 07/18/2017   HCT 45.1 07/18/2017   PLT 248.0 07/18/2017   GLUCOSE 130 (H) 07/18/2017   CHOL 233 (H) 05/30/2017   TRIG 61.0 05/30/2017   HDL 92.00 05/30/2017   LDLDIRECT 135.9 10/16/2012   LDLCALC 129 (H) 05/30/2017   ALT 20 07/18/2017   AST 21 07/18/2017   NA 136 07/18/2017   K 3.8 07/18/2017   CL 96 07/18/2017   CREATININE 0.91 07/18/2017   BUN 22 07/18/2017   CO2 35 (H) 07/18/2017   TSH 1.37 05/30/2017   INR 1.23 03/12/2015   BP Readings from Last 3 Encounters:  07/18/17 116/70  05/24/17 120/80  02/22/17 (!) 148/78   Wt Readings from Last 3 Encounters:  07/18/17 138 lb (62.6 kg)  05/24/17 136 lb 6.4 oz (61.9 kg)  02/21/17 139 lb 12.8 oz (63.4 kg)     ASSESSMENT AND PLAN:  Discussed the following assessment and plan:  Rectal bleeding - w abd cramps  2 20  needs eval lab today get to gi. ischemic colitis vs other  - Plan: CBC with Differential/Platelet, CMP, Ambulatory referral to Gastroenterology  Medication management - Plan: CBC with Differential/Platelet, CMP  Right-sided face pain  Trigeminal neuralgia Plan  Cbc diff and gi evaluation .   At this time seems stable but if recurs to seek Ed care .   No ibu for now  Will  Consider appro[riate referral for eval rx of face pain . She says character is same but not the same as the TN she had decompression surgery for.    -Patient advised to return or notify health care team  if  new concerns arise.  Patient Instructions  Getting blood count today and we need you to see   Gi doc    Consider possible    Diverticular bleed or other .   Will do new referral.   If the bleeding   Recurrs.   Go to the ED.     I will think   About referral about the right face jaw pain.            [name]. Hoby Kawai M.D.

## 2017-07-18 ENCOUNTER — Encounter: Payer: Self-pay | Admitting: Internal Medicine

## 2017-07-18 ENCOUNTER — Ambulatory Visit: Payer: Medicare Other | Admitting: Internal Medicine

## 2017-07-18 VITALS — BP 116/70 | HR 62 | Temp 97.5°F | Wt 138.0 lb

## 2017-07-18 DIAGNOSIS — R51 Headache: Secondary | ICD-10-CM | POA: Diagnosis not present

## 2017-07-18 DIAGNOSIS — Z79899 Other long term (current) drug therapy: Secondary | ICD-10-CM

## 2017-07-18 DIAGNOSIS — K625 Hemorrhage of anus and rectum: Secondary | ICD-10-CM | POA: Diagnosis not present

## 2017-07-18 DIAGNOSIS — G500 Trigeminal neuralgia: Secondary | ICD-10-CM | POA: Diagnosis not present

## 2017-07-18 DIAGNOSIS — R519 Headache, unspecified: Secondary | ICD-10-CM

## 2017-07-18 LAB — COMPREHENSIVE METABOLIC PANEL
ALT: 20 U/L (ref 0–35)
AST: 21 U/L (ref 0–37)
Albumin: 3.8 g/dL (ref 3.5–5.2)
Alkaline Phosphatase: 77 U/L (ref 39–117)
BUN: 22 mg/dL (ref 6–23)
CO2: 35 mEq/L — ABNORMAL HIGH (ref 19–32)
Calcium: 9.6 mg/dL (ref 8.4–10.5)
Chloride: 96 mEq/L (ref 96–112)
Creatinine, Ser: 0.91 mg/dL (ref 0.40–1.20)
GFR: 63.54 mL/min (ref >60.00)
Glucose, Bld: 130 mg/dL — ABNORMAL HIGH (ref 70–99)
Potassium: 3.8 mEq/L (ref 3.5–5.1)
Sodium: 136 mEq/L (ref 135–145)
Total Bilirubin: 0.6 mg/dL (ref 0.2–1.2)
Total Protein: 6 g/dL (ref 6.0–8.3)

## 2017-07-18 LAB — CBC WITH DIFFERENTIAL/PLATELET
Basophils Absolute: 0.1 10*3/uL (ref 0.0–0.1)
Basophils Relative: 1.2 % (ref 0.0–3.0)
Eosinophils Absolute: 0.2 10*3/uL (ref 0.0–0.7)
Eosinophils Relative: 4.9 % (ref 0.0–5.0)
HCT: 45.1 % (ref 36.0–46.0)
Hemoglobin: 15.5 g/dL — ABNORMAL HIGH (ref 12.0–15.0)
Lymphocytes Relative: 22.5 % (ref 12.0–46.0)
Lymphs Abs: 1 10*3/uL (ref 0.7–4.0)
MCHC: 34.4 g/dL (ref 30.0–36.0)
MCV: 90.7 fl (ref 78.0–100.0)
Monocytes Absolute: 0.4 10*3/uL (ref 0.1–1.0)
Monocytes Relative: 9.1 % (ref 3.0–12.0)
Neutro Abs: 2.9 10*3/uL (ref 1.4–7.7)
Neutrophils Relative %: 62.3 % (ref 43.0–77.0)
Platelets: 248 10*3/uL (ref 150.0–400.0)
RBC: 4.97 Mil/uL (ref 3.87–5.11)
RDW: 12.9 % (ref 11.5–15.5)
WBC: 4.6 10*3/uL (ref 4.0–10.5)

## 2017-07-18 NOTE — Patient Instructions (Addendum)
Getting blood count today and we need you to see   Gi doc    Consider possible   Diverticular bleed or other .   Will do new referral.   If the bleeding   Recurrs.   Go to the ED.     I will think   About referral about the right face jaw pain.

## 2017-07-24 ENCOUNTER — Encounter: Payer: Self-pay | Admitting: Internal Medicine

## 2017-07-24 DIAGNOSIS — R6884 Jaw pain: Secondary | ICD-10-CM

## 2017-07-26 ENCOUNTER — Ambulatory Visit: Payer: Medicare Other | Admitting: Internal Medicine

## 2017-08-02 ENCOUNTER — Encounter: Payer: Self-pay | Admitting: Internal Medicine

## 2017-08-02 DIAGNOSIS — H5213 Myopia, bilateral: Secondary | ICD-10-CM | POA: Diagnosis not present

## 2017-08-08 ENCOUNTER — Other Ambulatory Visit: Payer: Self-pay | Admitting: Internal Medicine

## 2017-08-11 ENCOUNTER — Other Ambulatory Visit: Payer: Self-pay | Admitting: Internal Medicine

## 2017-08-20 ENCOUNTER — Other Ambulatory Visit: Payer: Self-pay | Admitting: Internal Medicine

## 2017-09-04 ENCOUNTER — Ambulatory Visit: Payer: Medicare Other | Admitting: Internal Medicine

## 2017-09-06 NOTE — Progress Notes (Signed)
Chief Complaint  Patient presents with  . Jaw Pain    right side, makes the whole right side of the face sinsitive to touch, pain is reoccuring at random times, no triger,    . Dizziness    reoccuring, effects balance,    HPI: [name] 77 y.o. come in for  SDA   For episode  That has since resolve and   Has allergies and cough today   ? Allergies    And sinus infection . Was in trip [place]     Stood up from breakfast talbe   At [place]   Boat trip back to [place]  For 2 days back  .    And had dizziness  Imbalance feeling   And count get right hand to sign form in right place    coudlnt sign form   Couldn't coordinate      Could read ok .  No diplopia     And going through customs  And  And when went.to the left  Walking  Had to get help to direct  Walking  on ramp and saw ok  coudnt get  In line.   Was scared with scared  When this happened.  Then went home driving [employer] driving . And was fine   Stopped the next day .  Was ok .   Having  Sinus infection.    Nofever." I need and antibiotic"   5 days?  drainage and  Right face hrust worse than usus al and no fever  .  Taking a supplement dro at night for a while   To help sleep not sure whats in it ? If cbd related  No excess pain med  Uses ocass Fioricet but not related to above  ( rare use)  Not taking tramadol  ROS: See pertinent positives and negatives per HPI.  Past Medical History:  Diagnosis Date  . ADJ DISORDER WITH MIXED ANXIETY & DEPRESSED MOOD 03/03/2010   Qualifier: Diagnosis of  By: [name], [name]   . Closed head injury 02/01/2011   from syncope and had scalp laceration  neg ct .    Marland Kitchen Closed head injury 5-6 yrs ago  . Complication of anesthesia    migraine several hours after general anesthesia  . Fatty liver   . Gall stones 2016   see ct scan neg HIDA   . GERD (gastroesophageal reflux disease)   . Hearing aid worn   . HOH (hard of hearing)    both ears  . Hyperlipidemia   . Hypertension      echo nl lv function  mild dilitation 2009  . Kidney infection    few yrs ago in hospital  . Medication side effect 09/02/2010   Poss muscle se of 10 crestor   . Migraine    hypnic HA eval by Dr. Earley Favor in the past  . Polycythemia   . 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 Syncope 02/01/2011   In shower on vacation  sustained head laceration  8 sutures Had ed visit neg head ct labs and x ray   . Trigeminal neuralgia pain     Family History  Problem Relation Age of Onset  . Ovarian cancer Mother   . Stroke Mother   . Alcohol abuse Father   . Stroke Father   . Diabetes Brother   .  Cancer Paternal Aunt        leukemia, unknown type  . Seizures Daughter   . Hypertension Unknown   . Colon cancer Neg Hx     Social History   Socioeconomic History  . Marital status: Married    Spouse name: Not on file  . Number of children: 2  . Years of education: Not on file  . Highest education level: Not on file  Occupational History    Comment: retired Forensic psychologist  Social Needs  . Financial resource strain: Not on file  . Food insecurity:    Worry: Not on file    Inability: Not on file  . Transportation needs:    Medical: Not on file    Non-medical: Not on file  Tobacco Use  . Smoking status: Never Smoker  . Smokeless tobacco: Never Used  Substance and Sexual Activity  . Alcohol use: Yes    Alcohol/week: 1.2 oz    Types: 2 Glasses of wine per week    Comment: occ wine  . Drug use: No  . Sexual activity: Not on file  Lifestyle  . Physical activity:    Days per week: Not on file    Minutes per session: Not on file  . Stress: Not on file  Relationships  . Social connections:    Talks on phone: Not on file    Gets together: Not on file    Attends religious service: Not on file    Active member of club or organization: Not on file    Attends meetings of clubs or organizations: Not on file     Relationship status: Not on file  Other Topics Concern  . Not on file  Social History Narrative   Married   HH of 2-3 (god daughter)   Pets 2 dogs   Non smoker    Child is a physician   G2P2             Allergies as of 09/07/2017      Reactions   Hydrocodone Other (See Comments)   Rebound headaches   Oxycodone Other (See Comments)   Rebound headaches   Sulfamethoxazole-trimethoprim    REACTION: unspecified      Medication List        Accurate as of 09/07/17  2:41 PM. Always use your most recent med list.          albuterol 108 (90 Base) MCG/ACT inhaler Commonly known as:  PROVENTIL HFA;VENTOLIN HFA Inhale 2 puffs into the lungs every 6 (six) hours as needed.   amLODipine 10 MG tablet Commonly known as:  NORVASC TAKE 1 TABLET BY MOUTH ONCE DAILY   amoxicillin-clavulanate 875-125 MG tablet Commonly known as:  AUGMENTIN Take 1 tablet by mouth every 12 (twelve) hours. If needed for sinusitis   ASCOMP-CODEINE 50-325-40-30 MG capsule Generic drug:  butalbital-aspirin-caffeine-codeine TAKE 1-2 CAPSULES BY MOUTH EVERY FOUR TO SIX HOURS AS NEEDED FOR MIGRAINE MAX OF 6 CAPSULES IN 24 HOURS   benazepril 20 MG tablet Commonly known as:  LOTENSIN TAKE 1 TABLET BY MOUTH ONCE DAILY   budesonide-formoterol 160-4.5 MCG/ACT inhaler Commonly known as:  SYMBICORT Inhale 2 puffs into the lungs 2 (two) times daily.   chlorthalidone 25 MG tablet Commonly known as:  HYGROTON TAKE 1 TABLET BY MOUTH ONCE DAILY   conjugated estrogens vaginal cream Commonly known as:  PREMARIN Place 1 Applicatorful vaginally 2 (two) times a week.   EPITOL 200 MG tablet Generic drug:  carbamazepine  TAKE 2 TABLETS BY MOUTH TWICE DAILY   fluticasone 50 MCG/ACT nasal spray Commonly known as:  FLONASE Place into both nostrils as needed for allergies or rhinitis.   metoprolol tartrate 50 MG tablet Commonly known as:  LOPRESSOR TAKE 1/2 (ONE-HALF) TABLET BY MOUTH TWICE DAILY   RESTASIS 0.05 %  ophthalmic emulsion Generic drug:  cycloSPORINE Place 1 drop into both eyes 2 (two) times daily.   ZANTAC 150 MG tablet Generic drug:  ranitidine Take 1 tablet by mouth daily as needed.         EXAM:  BP 118/68 (BP Location: Left Arm, Patient Position: Sitting, Cuff Size: Normal)   Pulse 60   Temp 98 F (36.7 C) (Oral)   Wt 137 lb 12.8 oz (62.5 kg)   BMI 22.76 kg/m   Body mass index is 22.76 kg/m.  GENERAL: vitals reviewed and listed above, alert, oriented, appears well hydrated and in no acute distress hoarse and congested but well  HEENT: atraumatic, conjunctiva  clear, no obvious abnormalities on inspection of external nose congested  and earstm clear  Right face jaw sensitive no swlelling  OP : no lesion edema or exudate  NECK: no obvious masses on inspection palpation  LUNGS: clear to auscultation bilaterally, no wheezes, rales or rhonchi, good air movement CV: HRRR, no clubbing cyanosis puffy dependent  1+ feet peripheral edema nl cap refill  MS: moves all extremities without noticeable focal  abnormality PSYCH: pleasant and cooperative, no obvious depression or anxiety Neuro no focal cn seem ok gait nl eoms nl and  Neg rhomberg  Lab Results  Component Value Date   WBC 4.6 07/18/2017   HGB 15.5 (H) 07/18/2017   HCT 45.1 07/18/2017   PLT 248.0 07/18/2017   GLUCOSE 130 (H) 07/18/2017   CHOL 233 (H) 05/30/2017   TRIG 61.0 05/30/2017   HDL 92.00 05/30/2017   LDLDIRECT 135.9 10/16/2012   LDLCALC 129 (H) 05/30/2017   ALT 20 07/18/2017   AST 21 07/18/2017   NA 136 07/18/2017   K 3.8 07/18/2017   CL 96 07/18/2017   CREATININE 0.91 07/18/2017   BUN 22 07/18/2017   CO2 35 (H) 07/18/2017   TSH 1.37 05/30/2017   INR 1.23 03/12/2015   BP Readings from Last 3 Encounters:  09/07/17 118/68  07/18/17 116/70  05/24/17 120/80    ASSESSMENT AND PLAN:  Discussed the following assessment and plan:  Dizzy spell - see text assoc withtemporary imbalance  and inabiltiy  to walk a striagt line  or write on a signature line  - Plan: CBC with Differential/Platelet, CMP, Pain Mgmt, Profile 8 w/Conf, U, MR Brain Wo Contrast, VAS US CAROTID  Sinusitis, unspecified chronicity, unspecified location - Plan: CBC with Differential/Platelet, CMP, Pain Mgmt, Profile 8 w/Conf, U, MR Brain Wo Contrast, VAS US CAROTID  Ataxia - episode see text - Plan: MR Brain Wo Contrast, VAS US CAROTID  Medication management - Plan: CBC with Differential/Platelet, CMP, Pain Mgmt, Profile 8 w/Conf, U, MR Brain Wo Contrast, VAS US CAROTID R/o event  ? tia ?  Effect  Med?   Impending  Sinusitis  Plan eval  Med screen labs and mri brain and dopplers    Add asa low dose  For now  ampopole extract.  Uncertain  ingredients but used for sleep   Disc seeing neurology also .  After getting more information  Total visit 47mns > 50% spent counseling and coordinating care as indicated in above note and in instructions to  patient .  -Patient advised to return or notify health care team  if  new concerns arise. To ed if recurs  In interim  Patient Instructions  Checking for  Substance effect .  vascularl event such as a TIA     Poss sinus disease could have caused this also  .  We may want  to get neurology  Dr Tomi Likens or other to see you also about opinion  . As to cause  Glad  You are feeling better otherwise If recurs  Go to ED for evaluation.   May have you take asa  Per day in the interim if not already taking  In case this is a vascular problem     [name]. Arliss Frisina M.D.

## 2017-09-07 ENCOUNTER — Ambulatory Visit: Payer: Medicare Other | Admitting: Internal Medicine

## 2017-09-07 ENCOUNTER — Encounter: Payer: Self-pay | Admitting: Internal Medicine

## 2017-09-07 VITALS — BP 118/68 | HR 60 | Temp 98.0°F | Wt 137.8 lb

## 2017-09-07 DIAGNOSIS — R42 Dizziness and giddiness: Secondary | ICD-10-CM | POA: Diagnosis not present

## 2017-09-07 DIAGNOSIS — Z79899 Other long term (current) drug therapy: Secondary | ICD-10-CM | POA: Diagnosis not present

## 2017-09-07 DIAGNOSIS — J329 Chronic sinusitis, unspecified: Secondary | ICD-10-CM

## 2017-09-07 DIAGNOSIS — R270 Ataxia, unspecified: Secondary | ICD-10-CM | POA: Diagnosis not present

## 2017-09-07 LAB — CBC WITH DIFFERENTIAL/PLATELET
Basophils Absolute: 0.1 10*3/uL (ref 0.0–0.1)
Basophils Relative: 0.8 % (ref 0.0–3.0)
Eosinophils Absolute: 0.2 10*3/uL (ref 0.0–0.7)
Eosinophils Relative: 3.3 % (ref 0.0–5.0)
HCT: 43.5 % (ref 36.0–46.0)
Hemoglobin: 15.1 g/dL — ABNORMAL HIGH (ref 12.0–15.0)
Lymphocytes Relative: 12.4 % (ref 12.0–46.0)
Lymphs Abs: 0.7 10*3/uL (ref 0.7–4.0)
MCHC: 34.7 g/dL (ref 30.0–36.0)
MCV: 90.1 fl (ref 78.0–100.0)
Monocytes Absolute: 0.5 10*3/uL (ref 0.1–1.0)
Monocytes Relative: 8.2 % (ref 3.0–12.0)
Neutro Abs: 4.5 10*3/uL (ref 1.4–7.7)
Neutrophils Relative %: 75.3 % (ref 43.0–77.0)
Platelets: 205 10*3/uL (ref 150.0–400.0)
RBC: 4.82 Mil/uL (ref 3.87–5.11)
RDW: 12.8 % (ref 11.5–15.5)
WBC: 5.9 10*3/uL (ref 4.0–10.5)

## 2017-09-07 LAB — COMPREHENSIVE METABOLIC PANEL
ALT: 20 U/L (ref 0–35)
AST: 23 U/L (ref 0–37)
Albumin: 3.8 g/dL (ref 3.5–5.2)
Alkaline Phosphatase: 76 U/L (ref 39–117)
BUN: 16 mg/dL (ref 6–23)
CO2: 33 mEq/L — ABNORMAL HIGH (ref 19–32)
Calcium: 9.3 mg/dL (ref 8.4–10.5)
Chloride: 91 mEq/L — ABNORMAL LOW (ref 96–112)
Creatinine, Ser: 0.73 mg/dL (ref 0.40–1.20)
GFR: 81.92 mL/min (ref >60.00)
Glucose, Bld: 79 mg/dL (ref 70–99)
Potassium: 3.7 mEq/L (ref 3.5–5.1)
Sodium: 132 mEq/L — ABNORMAL LOW (ref 135–145)
Total Bilirubin: 0.8 mg/dL (ref 0.2–1.2)
Total Protein: 5.8 g/dL — ABNORMAL LOW (ref 6.0–8.3)

## 2017-09-07 MED ORDER — AMOXICILLIN-POT CLAVULANATE 875-125 MG PO TABS: 1.0000 | ORAL_TABLET | Freq: Two times a day (BID) | ORAL | 0 refills | Status: DC

## 2017-09-07 NOTE — Patient Instructions (Signed)
Checking for  Substance effect .  vascularl event such as a TIA     Poss sinus disease could have caused this also  .  We may want  to get neurology  Dr Tomi Likens or other to see you also about opinion  . As to cause  Glad  You are feeling better otherwise If recurs  Go to ED for evaluation.   May have you take asa  Per day in the interim if not already taking  In case this is a vascular problem

## 2017-09-13 LAB — PAIN MGMT, PROFILE 8 W/CONF, U
6 Acetylmorphine: NEGATIVE ng/mL (ref <10)
Alcohol Metabolites: NEGATIVE ng/mL (ref <500)
Alphahydroxyalprazolam: NEGATIVE ng/mL (ref <25)
Alphahydroxymidazolam: NEGATIVE ng/mL (ref <50)
Alphahydroxytriazolam: NEGATIVE ng/mL (ref <50)
Aminoclonazepam: NEGATIVE ng/mL (ref <25)
Amphetamines: NEGATIVE ng/mL (ref <500)
Benzodiazepines: NEGATIVE ng/mL (ref <100)
Buprenorphine, Urine: NEGATIVE ng/mL (ref <5)
Cocaine Metabolite: NEGATIVE ng/mL (ref <150)
Codeine: 4609 ng/mL — ABNORMAL HIGH (ref <50)
Creatinine: 111.2 mg/dL
Hydrocodone: NEGATIVE ng/mL (ref <50)
Hydromorphone: NEGATIVE ng/mL (ref <50)
Hydroxyethylflurazepam: NEGATIVE ng/mL (ref <50)
Lorazepam: NEGATIVE ng/mL (ref <50)
MDMA: NEGATIVE ng/mL (ref <500)
Marijuana Metabolite: 9 ng/mL — ABNORMAL HIGH (ref <5)
Marijuana Metabolite: POSITIVE ng/mL — AB (ref <20)
Morphine: 99 ng/mL — ABNORMAL HIGH (ref <50)
Nordiazepam: NEGATIVE ng/mL (ref <50)
Norhydrocodone: 176 ng/mL — ABNORMAL HIGH (ref <50)
Opiates: POSITIVE ng/mL — AB (ref <100)
Oxazepam: NEGATIVE ng/mL (ref <50)
Oxidant: NEGATIVE ug/mL (ref <200)
Oxycodone: NEGATIVE ng/mL (ref <100)
Temazepam: NEGATIVE ng/mL (ref <50)
pH: 7.19 (ref 4.5–9.0)

## 2017-09-18 ENCOUNTER — Encounter: Payer: Self-pay | Admitting: Internal Medicine

## 2017-09-18 DIAGNOSIS — R270 Ataxia, unspecified: Secondary | ICD-10-CM

## 2017-09-18 DIAGNOSIS — R42 Dizziness and giddiness: Secondary | ICD-10-CM

## 2017-09-19 NOTE — Telephone Encounter (Signed)
Will send to Dr Regis Bill as she has the patients supplement she dropped off.

## 2017-09-20 NOTE — Telephone Encounter (Signed)
Thanks for the update   The med certainly could do a low level of  THC   The opiate could have been   The codiene  If you had taken it recently before the test   I am sorry to say that im not sure if the  fioricet you have would explain all the opiate pos findings   [name] I put in a referral to dr Tomi Likens   For a new problem evaluation.

## 2017-09-25 ENCOUNTER — Other Ambulatory Visit: Payer: Self-pay | Admitting: Internal Medicine

## 2017-09-25 DIAGNOSIS — R270 Ataxia, unspecified: Secondary | ICD-10-CM

## 2017-09-25 DIAGNOSIS — R42 Dizziness and giddiness: Secondary | ICD-10-CM

## 2017-09-26 ENCOUNTER — Ambulatory Visit: Payer: Medicare Other | Admitting: Internal Medicine

## 2017-09-26 ENCOUNTER — Encounter: Payer: Self-pay | Admitting: Internal Medicine

## 2017-09-26 VITALS — BP 110/80 | HR 74 | Ht 66.0 in | Wt 138.1 lb

## 2017-09-26 DIAGNOSIS — K921 Melena: Secondary | ICD-10-CM

## 2017-09-26 DIAGNOSIS — R110 Nausea: Secondary | ICD-10-CM | POA: Diagnosis not present

## 2017-09-26 DIAGNOSIS — R1013 Epigastric pain: Secondary | ICD-10-CM

## 2017-09-26 MED ORDER — RANITIDINE HCL 300 MG PO TABS: 300.0000 mg | ORAL_TABLET | Freq: Every day | ORAL | 3 refills | Status: DC

## 2017-09-26 NOTE — Progress Notes (Signed)
[name] 77 y.o. [birth year] [id number]  Assessment & Plan:   Encounter Diagnoses  Name Primary?  . Nausea without vomiting Yes  . Dyspepsia   . Hematochezia x 1 day 06/2017 - suspect diverticular bleed      It could very well be that stress is causing her nausea and dyspepsia.  It seems to respond to ranitidine and/or food in the morning.  She does not seem to have major risk factors for reflux though reflux may cause this or perhaps hyper acidity.  I am going to have her try ranitidine 300 mg at bedtime.  She is to update me in a month by my chart as to how she is doing.  Since she has not had unintentional weight loss dysphagia or anemia or signs of upper GI bleeding I am not inclined to pursue an EGD right now given the clinical context.  He is comfortable with this.  If there are persistent issues we may need to do that.  Regarding the hematochezia that she had for 1 day only in February in [place], it sounds very much like a diverticular hemorrhage that was self-limited, painless bright red blood, she has known severe sigmoid diverticulosis colonoscopy in 2015.  We will observe if she has further problems we will pursue endoscopic evaluation.  I appreciate the opportunity to care for this patient. CC: [name], [name], MD  Subjective:   Chief Complaint: Nausea and bleeding per rectum  HPI The patient is a 77 year old Caucasian woman here for follow-up after she had a bleeding episode while on a trip to [place] in February.  She reports sudden onset of painless right red blood per rectum, several episodes that stopped on its own and did not recur while there and has not recurred since.  Her hemoglobin has been normal.  She had a colonoscopy with diverticulosis and hemorrhoids but otherwise normal in 2015.  There are no bowel habit issues ongoing.  Of more concern to her is a daily morning nausea that is helped by taking ranitidine 150 mg as needed and eating some food.   She denies dysphagia classic heartburn.  She does not eat late night meals.  She has some caffeine in the mornings but not the rest of the day.  She does suffer from insomnia and has a lot of ongoing stress with her adult children.  She believes that is the root cause of her upset stomach.  She lost 40 pounds over a number of years by following the wheat belly diet but has leveled off and denies unintentional weight loss.  The right there thanks she did have a cholecystectomy for cholelithiasis last year and said that those symptoms are all gone.    Wt Readings from Last 3 Encounters:  09/26/17 138 lb 2 oz (62.7 kg)  09/07/17 137 lb 12.8 oz (62.5 kg)  07/18/17 138 lb (62.6 kg)    Allergies  Allergen Reactions  . Hydrocodone Other (See Comments)    Rebound headaches  . Oxycodone Other (See Comments)    Rebound headaches  . Sulfamethoxazole-Trimethoprim     REACTION: unspecified   Current Meds  Medication Sig  . albuterol (PROVENTIL HFA;VENTOLIN HFA) 108 (90 Base) MCG/ACT inhaler Inhale 2 puffs into the lungs every 6 (six) hours as needed.  Marland Kitchen amLODipine (NORVASC) 10 MG tablet TAKE 1 TABLET BY MOUTH ONCE DAILY  . ASCOMP-CODEINE 50-325-40-30 MG capsule TAKE 1-2 CAPSULES BY MOUTH EVERY FOUR TO SIX HOURS AS NEEDED FOR MIGRAINE MAX  OF 6 CAPSULES IN 24 HOURS  . benazepril (LOTENSIN) 20 MG tablet TAKE 1 TABLET BY MOUTH ONCE DAILY  . budesonide-formoterol (SYMBICORT) 160-4.5 MCG/ACT inhaler Inhale 2 puffs into the lungs 2 (two) times daily. (Patient taking differently: Inhale 2 puffs into the lungs 2 (two) times daily as needed. )  . chlorthalidone (HYGROTON) 25 MG tablet TAKE 1 TABLET BY MOUTH ONCE DAILY  . conjugated estrogens (PREMARIN) vaginal cream Place 1 Applicatorful vaginally 2 (two) times a week.  . EPITOL 200 MG tablet TAKE 2 TABLETS BY MOUTH TWICE DAILY  . fluticasone (FLONASE) 50 MCG/ACT nasal spray Place into both nostrils as needed for allergies or rhinitis.  . metoprolol  tartrate (LOPRESSOR) 50 MG tablet TAKE 1/2 (ONE-HALF) TABLET BY MOUTH TWICE DAILY  . RESTASIS 0.05 % ophthalmic emulsion Place 1 drop into both eyes 2 (two) times daily.   . [DISCONTINUED] ranitidine (ZANTAC) 150 MG tablet Take 1 tablet by mouth daily as needed.    Past Medical History:  Diagnosis Date  . ADJ DISORDER WITH MIXED ANXIETY & DEPRESSED MOOD 03/03/2010   Qualifier: Diagnosis of  By: Angela Bill MD, Standley Barrera   . Closed head injury 02/01/2011   from syncope and had scalp laceration  neg ct .    Marland Kitchen Closed head injury 5-6 yrs ago  . Complication of anesthesia    migraine several hours after general anesthesia  . Fatty liver   . Gall stones 2016   see ct scan neg HIDA   . GERD (gastroesophageal reflux disease)   . Hearing aid worn   . HOH (hard of hearing)    both ears  . Hyperlipidemia   . Hypertension    echo nl lv function  mild dilitation 2009  . Kidney infection    few yrs ago in hospital  . Medication side effect 09/02/2010   Poss muscle se of 10 crestor   . Migraine    hypnic HA eval by Dr. Earley Favor in the past  . Polycythemia   . 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 Syncope 02/01/2011   In shower on vacation  sustained head laceration  8 sutures Had ed visit neg head ct labs and x ray   . Trigeminal neuralgia pain    Past Surgical History:  Procedure Laterality Date  . ABDOMINAL HYSTERECTOMY  2002   tubal  . CARDIAC CATHETERIZATION  2000   chest pains neg  . CHOLECYSTECTOMY N/A 02/21/2017   Procedure: LAPAROSCOPIC CHOLECYSTECTOMY WITH INTRAOPERATIVE CHOLANGIOGRAM;  Surgeon: Armandina Gemma, MD;  Location: [facility];  Service: General;  Laterality: N/A;  . COLONOSCOPY     multiple  . CRANIOTOMY  12/09/2011   nerve decompression right trigeminal   . DOPPLER ECHOCARDIOGRAPHY  2009   nl lv function mild lv dilitation  . EYE SURGERY Bilateral    ioc for catatracts  . laparoscopic gallbladder surgery   02/16/2017   Fax from [facility]  . OOPHORECTOMY Bilateral 2002  . rt shoulder surgery     Social History   Social History Narrative   Married   HH of 2-3 (god daughter)   Pets 2 dogs   Non smoker    Child is a physician   G2P2            family history includes Alcohol abuse in her father; Cancer in her paternal aunt; Diabetes in her brother; Hypertension in her unknown  relative; Ovarian cancer in her mother; Seizures in her daughter; Stroke in her father and mother.   Review of Systems As per HPI. She did have some sort of a dizzy spell last month.  She was in the [place], with some discoordination.  Dr. Regis Barrera performed some testing, the patient had been using some sort of CBD oil so the screen came back for marijuana metabolites, opiates also positive though she rarely takes codeine for headaches.  That seems to be all resolved.  She stopped taking CBD oil. Objective:   Physical Exam BP 110/80   Pulse 74   Ht 5' 6"  (1.676 m)   Wt 138 lb 2 oz (62.7 kg)   BMI 22.29 kg/m  No acute distress well-developed well-nourished elderly white woman looking younger than stated age Eyes are anicteric The abdomen is soft nontender bowel sounds are present there is no organomegaly or mass She is alert and oriented x3 and has an appropriate mood and affect  Data reviewed: I reviewed labs and primary care notes in the computer for 2018 and 2019 and as per HPI

## 2017-09-26 NOTE — Patient Instructions (Addendum)
  We have sent the following medications to your pharmacy for you to pick up at your convenience: Ranitidine   Message [place] in a month with a symptom update.    I appreciate the opportunity to care for you. Silvano Rusk, MD, [facility]

## 2017-09-27 ENCOUNTER — Ambulatory Visit (HOSPITAL_COMMUNITY)
Admission: RE | Admit: 2017-09-27 | Discharge: 2017-09-27 | Disposition: A | Discharge status: Home / Self Care | Payer: Medicare Other | Source: Ambulatory Visit | Attending: Cardiology | Admitting: Cardiology

## 2017-09-27 DIAGNOSIS — R270 Ataxia, unspecified: Secondary | ICD-10-CM | POA: Diagnosis not present

## 2017-09-27 DIAGNOSIS — I6521 Occlusion and stenosis of right carotid artery: Secondary | ICD-10-CM | POA: Diagnosis not present

## 2017-09-27 DIAGNOSIS — E785 Hyperlipidemia, unspecified: Secondary | ICD-10-CM | POA: Insufficient documentation

## 2017-09-27 DIAGNOSIS — R42 Dizziness and giddiness: Secondary | ICD-10-CM | POA: Insufficient documentation

## 2017-09-27 DIAGNOSIS — I10 Essential (primary) hypertension: Secondary | ICD-10-CM | POA: Diagnosis not present

## 2017-09-29 ENCOUNTER — Other Ambulatory Visit: Payer: Medicare Other

## 2017-09-29 ENCOUNTER — Encounter: Payer: Self-pay | Admitting: Neurology

## 2017-09-29 ENCOUNTER — Ambulatory Visit: Payer: Medicare Other | Admitting: Neurology

## 2017-09-29 VITALS — BP 114/66 | HR 61 | Ht 66.0 in | Wt 140.0 lb

## 2017-09-29 DIAGNOSIS — R270 Ataxia, unspecified: Secondary | ICD-10-CM | POA: Diagnosis not present

## 2017-09-29 DIAGNOSIS — R42 Dizziness and giddiness: Secondary | ICD-10-CM

## 2017-09-29 NOTE — Patient Instructions (Addendum)
1.  We will check MRI of brain. We have sent a referral to [facility] for your MRI and they will call you directly to schedule your appt. They are located at [place]. If you need to contact them directly please call [phone].  2.  We will also check another carbamazepine level. Your provider has requested that you have labwork completed today. Please go to [facility] ([id number]) on the second floor of this building before leaving the office today. You do not need to check in. If you are not called within 15 minutes please check with the front desk.   3.  Further recommendations pending results.

## 2017-09-29 NOTE — Progress Notes (Signed)
NEUROLOGY FOLLOW UP OFFICE NOTE  [name] [id number]  HISTORY OF PRESENT ILLNESS: [name] is a 77 year old right-handed female with hypertension, hyperlipidemia, trigeminal neuralgia, migraine and GERD whom I previously saw for right-sided unilateral headache presents today for ataxia and dizziness.    She was on a cruise in the [place] about a month ago.  One morning, she stood up from the breakfast table and suddenly felt dizzy.  It is difficult to describe.  It is not a lightheaded or spinning sensation.  She feels like her balance and position in space is off. When she was approaching the ramp to disembark the ship, she started to veer to the side and began walking beside the ramp.  The sensation lasted all day.  Since then, she has had similar but mild spells.  They occur when standing or walking.  Sometimes if she just sits down, there will be a lingering sensation.  She has walked into walls and has needed to hold onto her husband.  She has not fallen.  It is not daily.  There may be a few days between episodes.  She reports some difficulty focusing but denies double vision, vision loss, tinnitus, slurred speech, facial droop or unilateral numbness or weakness.  She is hard of hearing at baseline.  She has not had any change in medication.  Carotid doppler was performed yesterday, which revealed no hemodynamically significant ICA stenosis and with antegrade flow in both vertebral arteries.  Epitol (carbamazepine) 446m twice daily (for trigeminal neuralgia)  LABS: 09/07/17:  CBC with WBC 5.9, HGB 15.1, HCT 43.5, PLT 205; CMP with Na 132, K 3.7, glucose 79, BUN 16, Cr 0.73, t bili 0.8, ALP 76, AST 23 and ALT 20   PAST MEDICAL HISTORY: Past Medical History:  Diagnosis Date  . ADJ DISORDER WITH MIXED ANXIETY & DEPRESSED MOOD 03/03/2010   Qualifier: Diagnosis of  By: PRegis BillMD, WStandley Brooking  . Closed head injury 02/01/2011   from syncope and had scalp laceration   neg ct .    .Marland KitchenClosed head injury 5-6 yrs ago  . Complication of anesthesia    migraine several hours after general anesthesia  . Fatty liver   . Gall stones 2016   see ct scan neg HIDA   . GERD (gastroesophageal reflux disease)   . Hearing aid worn   . HOH (hard of hearing)    both ears  . Hyperlipidemia   . Hypertension    echo nl lv function  mild dilitation 2009  . Kidney infection    few yrs ago in hospital  . Medication side effect 09/02/2010   Poss muscle se of 10 crestor   . Migraine    hypnic HA eval by Dr. AEarley Favorin the past  . Polycythemia   . 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 Syncope 02/01/2011   In shower on vacation  sustained head laceration  8 sutures Had ed visit neg head ct labs and x ray   . Trigeminal neuralgia pain     MEDICATIONS: Current Outpatient Medications on File Prior to Visit  Medication Sig Dispense Refill  . albuterol (PROVENTIL HFA;VENTOLIN HFA) 108 (90 Base) MCG/ACT inhaler Inhale 2 puffs into the lungs every 6 (six) hours as needed. 1 Inhaler 2  . amLODipine (NORVASC) 10 MG tablet TAKE 1 TABLET BY MOUTH ONCE DAILY 90 tablet 1  .  ASCOMP-CODEINE 50-325-40-30 MG capsule TAKE 1-2 CAPSULES BY MOUTH EVERY FOUR TO SIX HOURS AS NEEDED FOR MIGRAINE MAX OF 6 CAPSULES IN 24 HOURS 30 capsule 0  . benazepril (LOTENSIN) 20 MG tablet TAKE 1 TABLET BY MOUTH ONCE DAILY 90 tablet 1  . budesonide-formoterol (SYMBICORT) 160-4.5 MCG/ACT inhaler Inhale 2 puffs into the lungs 2 (two) times daily. (Patient taking differently: Inhale 2 puffs into the lungs 2 (two) times daily as needed. ) 1 Inhaler 2  . chlorthalidone (HYGROTON) 25 MG tablet TAKE 1 TABLET BY MOUTH ONCE DAILY 90 tablet 1  . conjugated estrogens (PREMARIN) vaginal cream Place 1 Applicatorful vaginally 2 (two) times a week.    . EPITOL 200 MG tablet TAKE 2 TABLETS BY MOUTH TWICE DAILY 360 tablet 0  . fluticasone (FLONASE) 50 MCG/ACT nasal  spray Place into both nostrils as needed for allergies or rhinitis.    . metoprolol tartrate (LOPRESSOR) 50 MG tablet TAKE 1/2 (ONE-HALF) TABLET BY MOUTH TWICE DAILY 90 tablet 0  . ranitidine (ZANTAC) 300 MG tablet Take 1 tablet (300 mg total) by mouth at bedtime. 90 tablet 3  . RESTASIS 0.05 % ophthalmic emulsion Place 1 drop into both eyes 2 (two) times daily.   1   No current facility-administered medications on file prior to visit.     ALLERGIES: Allergies  Allergen Reactions  . Hydrocodone Other (See Comments)    Rebound headaches  . Oxycodone Other (See Comments)    Rebound headaches  . Sulfamethoxazole-Trimethoprim     REACTION: unspecified    FAMILY HISTORY: Family History  Problem Relation Age of Onset  . Ovarian cancer Mother   . Stroke Mother   . Alcohol abuse Father   . Stroke Father   . Diabetes Brother   . Cancer Paternal Aunt        leukemia, unknown type  . Seizures Daughter   . Hypertension Unknown   . Colon cancer Neg Hx     SOCIAL HISTORY: Social History   Socioeconomic History  . Marital status: Married    Spouse name: Not on file  . Number of children: 2  . Years of education: Not on file  . Highest education level: Not on file  Occupational History    Comment: retired Forensic psychologist  Social Needs  . Financial resource strain: Not on file  . Food insecurity:    Worry: Not on file    Inability: Not on file  . Transportation needs:    Medical: Not on file    Non-medical: Not on file  Tobacco Use  . Smoking status: Never Smoker  . Smokeless tobacco: Never Used  Substance and Sexual Activity  . Alcohol use: Yes    Alcohol/week: 1.2 oz    Types: 2 Glasses of wine per week    Comment: occ wine  . Drug use: No  . Sexual activity: Not on file  Lifestyle  . Physical activity:    Days per week: Not on file    Minutes per session: Not on file  . Stress: Not on file  Relationships  . Social connections:    Talks on phone: Not on file      Gets together: Not on file    Attends religious service: Not on file    Active member of club or organization: Not on file    Attends meetings of clubs or organizations: Not on file    Relationship status: Not on file  . Intimate partner violence:  Fear of current or ex partner: Not on file    Emotionally abused: Not on file    Physically abused: Not on file    Forced sexual activity: Not on file  Other Topics Concern  . Not on file  Social History Narrative   Married   HH of 2-3 (god daughter)   Pets 2 dogs   Non smoker    Child is a physician   G2P2             REVIEW OF SYSTEMS: Constitutional: No fevers, chills, or sweats, no generalized fatigue, change in appetite Eyes: No visual changes, double vision, eye pain Ear, nose and throat: No hearing loss, ear pain, nasal congestion, sore throat Cardiovascular: No chest pain, palpitations Respiratory:  No shortness of breath at rest or with exertion, wheezes GastrointestinaI: No nausea, vomiting, diarrhea, abdominal pain, fecal incontinence Genitourinary:  No dysuria, urinary retention or frequency Musculoskeletal:  No neck pain, back pain Integumentary: No rash, pruritus, skin lesions Neurological: as above Psychiatric: No depression, insomnia, anxiety Endocrine: No palpitations, fatigue, diaphoresis, mood swings, change in appetite, change in weight, increased thirst Hematologic/Lymphatic:  No purpura, petechiae. Allergic/Immunologic: no itchy/runny eyes, nasal congestion, recent allergic reactions, rashes  PHYSICAL EXAM: Vitals:   09/29/17 1353  BP: 114/66  Pulse: 61  SpO2: 96%   General: No acute distress.  Patient appears well-groomed.  normal body habitus. Head:  Normocephalic/atraumatic Eyes:  Fundi examined but not visualized Neck: supple, no paraspinal tenderness, full range of motion Heart:  Regular rate and rhythm Lungs:  Clear to auscultation bilaterally Back: No paraspinal  tenderness Neurological Exam: alert and oriented to person, place, and time. Attention span and concentration intact, recent and remote memory intact, fund of knowledge intact.  Speech fluent and not dysarthric, language intact.  Saccadic eye movements in all directions on tracking.  Otherwise, CN II-XII intact. Bulk and tone normal, muscle strength 5/5 throughout.  Sensation to light touch, temperature and vibration intact.  Deep tendon reflexes 2+ throughout, toes downgoing.  Finger to nose and heel to shin testing intact.  Cautious mildly ataxic gait.  Unable to tandem walk. Romberg with mild sway.  IMPRESSION: Disequilibrium/ataxia.  Due to the sudden onset with severe initial attack, concern about cerebellar stroke.  Even though she has not had any change in carbamazepine dosing, we will recheck level.  Electrolytes reveal borderline low Na level, however this is not new.  PLAN: 1.  MRI of brain without aura 2.  Check carbamazepine level 3.  Further recommendations pending results.  25 minutes spent face to face with patient, over 50% spent discussing differential diagnoses and management.  Metta Clines, DO  CC:  [name], MD

## 2017-09-30 LAB — CARBAMAZEPINE LEVEL, TOTAL: Carbamazepine Lvl: 11.9 mg/L (ref 4.0–12.0)

## 2017-10-01 DIAGNOSIS — I10 Essential (primary) hypertension: Secondary | ICD-10-CM | POA: Diagnosis not present

## 2017-10-01 DIAGNOSIS — E871 Hypo-osmolality and hyponatremia: Secondary | ICD-10-CM | POA: Diagnosis not present

## 2017-10-01 DIAGNOSIS — R109 Unspecified abdominal pain: Secondary | ICD-10-CM | POA: Diagnosis not present

## 2017-10-01 DIAGNOSIS — R0902 Hypoxemia: Secondary | ICD-10-CM | POA: Diagnosis not present

## 2017-10-01 DIAGNOSIS — G4489 Other headache syndrome: Secondary | ICD-10-CM | POA: Diagnosis not present

## 2017-10-01 DIAGNOSIS — R531 Weakness: Secondary | ICD-10-CM | POA: Diagnosis not present

## 2017-10-01 DIAGNOSIS — R51 Headache: Secondary | ICD-10-CM | POA: Diagnosis not present

## 2017-10-01 DIAGNOSIS — R110 Nausea: Secondary | ICD-10-CM | POA: Diagnosis not present

## 2017-10-01 DIAGNOSIS — N390 Urinary tract infection, site not specified: Secondary | ICD-10-CM | POA: Diagnosis not present

## 2017-10-02 DIAGNOSIS — R531 Weakness: Secondary | ICD-10-CM | POA: Diagnosis not present

## 2017-10-02 DIAGNOSIS — E871 Hypo-osmolality and hyponatremia: Secondary | ICD-10-CM | POA: Diagnosis not present

## 2017-10-02 DIAGNOSIS — N390 Urinary tract infection, site not specified: Secondary | ICD-10-CM | POA: Diagnosis not present

## 2017-10-02 DIAGNOSIS — Z79899 Other long term (current) drug therapy: Secondary | ICD-10-CM | POA: Diagnosis not present

## 2017-10-02 DIAGNOSIS — N12 Tubulo-interstitial nephritis, not specified as acute or chronic: Secondary | ICD-10-CM | POA: Diagnosis not present

## 2017-10-02 DIAGNOSIS — R0902 Hypoxemia: Secondary | ICD-10-CM | POA: Diagnosis not present

## 2017-10-02 DIAGNOSIS — K5730 Diverticulosis of large intestine without perforation or abscess without bleeding: Secondary | ICD-10-CM | POA: Diagnosis not present

## 2017-10-02 DIAGNOSIS — N281 Cyst of kidney, acquired: Secondary | ICD-10-CM | POA: Diagnosis not present

## 2017-10-02 DIAGNOSIS — R109 Unspecified abdominal pain: Secondary | ICD-10-CM | POA: Diagnosis not present

## 2017-10-02 DIAGNOSIS — R509 Fever, unspecified: Secondary | ICD-10-CM | POA: Diagnosis not present

## 2017-10-02 DIAGNOSIS — N10 Acute tubulo-interstitial nephritis: Secondary | ICD-10-CM | POA: Diagnosis not present

## 2017-10-02 DIAGNOSIS — Z7982 Long term (current) use of aspirin: Secondary | ICD-10-CM | POA: Diagnosis not present

## 2017-10-02 DIAGNOSIS — B9620 Unspecified Escherichia coli [E. coli] as the cause of diseases classified elsewhere: Secondary | ICD-10-CM | POA: Diagnosis not present

## 2017-10-02 DIAGNOSIS — N200 Calculus of kidney: Secondary | ICD-10-CM | POA: Diagnosis not present

## 2017-10-02 DIAGNOSIS — I10 Essential (primary) hypertension: Secondary | ICD-10-CM | POA: Diagnosis not present

## 2017-10-02 DIAGNOSIS — G9340 Encephalopathy, unspecified: Secondary | ICD-10-CM | POA: Diagnosis not present

## 2017-10-02 DIAGNOSIS — R110 Nausea: Secondary | ICD-10-CM | POA: Diagnosis not present

## 2017-10-02 DIAGNOSIS — E860 Dehydration: Secondary | ICD-10-CM | POA: Diagnosis not present

## 2017-10-02 DIAGNOSIS — G9341 Metabolic encephalopathy: Secondary | ICD-10-CM | POA: Diagnosis not present

## 2017-10-03 ENCOUNTER — Telehealth: Payer: Self-pay

## 2017-10-03 DIAGNOSIS — N12 Tubulo-interstitial nephritis, not specified as acute or chronic: Secondary | ICD-10-CM | POA: Diagnosis not present

## 2017-10-03 DIAGNOSIS — G9340 Encephalopathy, unspecified: Secondary | ICD-10-CM | POA: Diagnosis not present

## 2017-10-03 NOTE — Telephone Encounter (Signed)
Called and LMOVM indicating Carbamazepine level is normal.

## 2017-10-03 NOTE — Telephone Encounter (Signed)
-----   Message from Pieter Partridge, DO sent at 10/02/2017  7:03 AM EDT ----- Carbamazepine level is normal

## 2017-10-06 NOTE — Progress Notes (Signed)
Chief Complaint  Patient presents with  . Follow-up    Pt states that she has not been getting as many dizzy spells in he last month. Pt seen in ED Mothers Day weekend with a kidney infection. Pt feels the dixzziness was coming from the infection she had. Completed Cefdinir Abx 10/08/17    HPI:  [name] 77 y.o. come in for fu   Dizzy spell  Ataxic  And gi sx    And also hosp fu  For  febrile illness uti when visited [place]  Was in 23 hour obsv  [place]   And given antibiotic just finished antibiotic . Her sx were fever and nause and not typical sx   has seen dr Loretta Plume abiout this   Ataxic episode  Ordered NRi but not got call yet   Has  dced the cd oil  Husband helps his joint pains ROS: See pertinent positives and negatives per HPI. No current fever vomiting apparently had  abd pelv ct and cx  No more gi bleed   See note dr Carlean Purl Past Medical History:  Diagnosis Date  . ADJ DISORDER WITH MIXED ANXIETY & DEPRESSED MOOD 03/03/2010   Qualifier: Diagnosis of  By: [name], [name]   . Closed head injury 02/01/2011   from syncope and had scalp laceration  neg ct .    Marland Kitchen Closed head injury 5-6 yrs ago  . Complication of anesthesia    migraine several hours after general anesthesia  . Fatty liver   . Gall stones 2016   see ct scan neg HIDA   . GERD (gastroesophageal reflux disease)   . Hearing aid worn   . HOH (hard of hearing)    both ears  . Hyperlipidemia   . Hypertension    echo nl lv function  mild dilitation 2009  . Kidney infection    few yrs ago in hospital  . Medication side effect 09/02/2010   Poss muscle se of 10 crestor   . Migraine    hypnic HA eval by Dr. Earley Favor in the past  . Polycythemia   . 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 Syncope 02/01/2011   In shower on vacation  sustained head laceration  8 sutures Had ed visit neg head ct labs and x ray   . Trigeminal  neuralgia pain     Family History  Problem Relation Age of Onset  . Ovarian cancer Mother   . Stroke Mother   . Alcohol abuse Father   . Stroke Father   . Diabetes Brother   . Cancer Paternal Aunt        leukemia, unknown type  . Seizures Daughter   . Hypertension Unknown   . Colon cancer Neg Hx     Social History   Socioeconomic History  . Marital status: Married    Spouse name: Not on file  . Number of children: 2  . Years of education: Not on file  . Highest education level: Not on file  Occupational History    Comment: retired Forensic psychologist  Social Needs  . Financial resource strain: Not on file  . Food insecurity:    Worry: Not on file    Inability: Not on file  . Transportation needs:    Medical: Not on file    Non-medical: Not on file  Tobacco Use  . Smoking status:  Never Smoker  . Smokeless tobacco: Never Used  Substance and Sexual Activity  . Alcohol use: Yes    Alcohol/week: 1.2 oz    Types: 2 Glasses of wine per week    Comment: occ wine  . Drug use: No  . Sexual activity: Not on file  Lifestyle  . Physical activity:    Days per week: Not on file    Minutes per session: Not on file  . Stress: Not on file  Relationships  . Social connections:    Talks on phone: Not on file    Gets together: Not on file    Attends religious service: Not on file    Active member of club or organization: Not on file    Attends meetings of clubs or organizations: Not on file    Relationship status: Not on file  Other Topics Concern  . Not on file  Social History Narrative   Married   HH of 2-3 (god daughter)   Pets 2 dogs   Non smoker    Child is a physician   G2P2             Outpatient Medications Prior to Visit  Medication Sig Dispense Refill  . albuterol (PROVENTIL HFA;VENTOLIN HFA) 108 (90 Base) MCG/ACT inhaler Inhale 2 puffs into the lungs every 6 (six) hours as needed. 1 Inhaler 2  . amLODipine (NORVASC) 10 MG tablet TAKE 1 TABLET BY MOUTH  ONCE DAILY 90 tablet 1  . ASCOMP-CODEINE 50-325-40-30 MG capsule TAKE 1-2 CAPSULES BY MOUTH EVERY FOUR TO SIX HOURS AS NEEDED FOR MIGRAINE MAX OF 6 CAPSULES IN 24 HOURS 30 capsule 0  . benazepril (LOTENSIN) 20 MG tablet TAKE 1 TABLET BY MOUTH ONCE DAILY 90 tablet 1  . budesonide-formoterol (SYMBICORT) 160-4.5 MCG/ACT inhaler Inhale 2 puffs into the lungs 2 (two) times daily. (Patient taking differently: Inhale 2 puffs into the lungs 2 (two) times daily as needed. ) 1 Inhaler 2  . chlorthalidone (HYGROTON) 25 MG tablet TAKE 1 TABLET BY MOUTH ONCE DAILY 90 tablet 1  . conjugated estrogens (PREMARIN) vaginal cream Place 1 Applicatorful vaginally 2 (two) times a week.    . EPITOL 200 MG tablet TAKE 2 TABLETS BY MOUTH TWICE DAILY 360 tablet 0  . fluticasone (FLONASE) 50 MCG/ACT nasal spray Place into both nostrils as needed for allergies or rhinitis.    . metoprolol tartrate (LOPRESSOR) 50 MG tablet TAKE 1/2 (ONE-HALF) TABLET BY MOUTH TWICE DAILY 90 tablet 0  . ranitidine (ZANTAC) 300 MG tablet Take 1 tablet (300 mg total) by mouth at bedtime. 90 tablet 3  . RESTASIS 0.05 % ophthalmic emulsion Place 1 drop into both eyes 2 (two) times daily.   1   No facility-administered medications prior to visit.      EXAM:  BP 132/88 (BP Location: Right Arm, Patient Position: Sitting, Cuff Size: Normal)   Pulse (!) 57   Temp 97.6 F (36.4 C) (Oral)   Wt 139 lb 3.2 oz (63.1 kg)   BMI 22.47 kg/m   Body mass index is 22.47 kg/m.  GENERAL: vitals reviewed and listed above, alert, oriented, appears well hydrated and in no acute distress HEENT: atraumatic, conjunctiva  clear, no obvious abnormalities on inspection of external nose and ears OP : no lesion edema or exudate  NECK: no obvious masses on inspection palpation  LUNGS: clear to auscultation bilaterally, no wheezes, rales or rhonchi, good air movement CV: HRRR, no clubbing cyanosis1 + pedal   peripheral  edema nl cap refill  Abdomen:  Sof,t normal  bowel sounds without hepatosplenomegaly, no guarding rebound or masses no CVA tenderness MS: moves all extremities without noticeable focal  abnormality PSYCH: pleasant and cooperative, no obvious depression or anxiety gait appears steady  Lab Results  Component Value Date   WBC 5.9 09/07/2017   HGB 15.1 (H) 09/07/2017   HCT 43.5 09/07/2017   PLT 205.0 09/07/2017   GLUCOSE 79 09/07/2017   CHOL 233 (H) 05/30/2017   TRIG 61.0 05/30/2017   HDL 92.00 05/30/2017   LDLDIRECT 135.9 10/16/2012   LDLCALC 129 (H) 05/30/2017   ALT 20 09/07/2017   AST 23 09/07/2017   NA 132 (L) 09/07/2017   K 3.7 09/07/2017   CL 91 (L) 09/07/2017   CREATININE 0.73 09/07/2017   BUN 16 09/07/2017   CO2 33 (H) 09/07/2017   TSH 1.37 05/30/2017   INR 1.23 03/12/2015   BP Readings from Last 3 Encounters:  10/09/17 132/88  09/29/17 114/66  09/26/17 110/80   Wt Readings from Last 3 Encounters:  10/09/17 139 lb 3.2 oz (63.1 kg)  09/29/17 140 lb (63.5 kg)  09/26/17 138 lb 2 oz (62.7 kg)   Blood work from [facility] reviewed and basically normal    Scan and cx result na at this time  ASSESSMENT AND PLAN:  Discussed the following assessment and plan:  Urinary tract infection without hematuria, site unspecified - Plan: Urinalysis, Routine w reflex microscopic, Urine Culture  Medication management  History of recurrent UTI (urinary tract infection) - Plan: Urinalysis, Routine w reflex microscopic, Urine Culture  Hospital discharge follow-up - Plan: Urinalysis, Routine w reflex microscopic, Urine Culture  Dizzy spell - uncertain cause could have been combo meds and cbd  still advise mri and neuro fu  Stopped the cbd oil  Incase related see uds  She is to call about getting mri done  Order is in [place] close fu  And repeat urine tests this week  And  rx according  Will review records [place] fu after all back or  New recurrent sx.  -Patient advised to return or notify health care team  if  new concerns  arise.  Patient Instructions     get a urine test  This Friday at the  [facility]  .  If any relapsing sx of fever nausea  Be checked right away.   Will  Work on  MRI  .   Can cancel   The may 29 the visit and plan fu dependiong on   Lab and how you are doing.      [name]. Dakia Barrera M.D.

## 2017-10-09 ENCOUNTER — Encounter: Payer: Self-pay | Admitting: Internal Medicine

## 2017-10-09 ENCOUNTER — Ambulatory Visit: Payer: Medicare Other | Admitting: Internal Medicine

## 2017-10-09 VITALS — BP 132/88 | HR 57 | Temp 97.6°F | Wt 139.2 lb

## 2017-10-09 DIAGNOSIS — Z09 Encounter for follow-up examination after completed treatment for conditions other than malignant neoplasm: Secondary | ICD-10-CM | POA: Diagnosis not present

## 2017-10-09 DIAGNOSIS — N390 Urinary tract infection, site not specified: Secondary | ICD-10-CM | POA: Diagnosis not present

## 2017-10-09 DIAGNOSIS — R42 Dizziness and giddiness: Secondary | ICD-10-CM

## 2017-10-09 DIAGNOSIS — Z87440 Personal history of urinary (tract) infections: Secondary | ICD-10-CM | POA: Diagnosis not present

## 2017-10-09 DIAGNOSIS — Z79899 Other long term (current) drug therapy: Secondary | ICD-10-CM | POA: Diagnosis not present

## 2017-10-09 NOTE — Patient Instructions (Addendum)
    get a urine test  This Friday at the  [facility]  .  If any relapsing sx of fever nausea  Be checked right away.   Will  Work on  MRI  .   Can cancel   The may 29 the visit and plan fu dependiong on   Lab and how you are doing.

## 2017-10-12 ENCOUNTER — Encounter: Payer: Self-pay | Admitting: Internal Medicine

## 2017-10-12 ENCOUNTER — Other Ambulatory Visit (INDEPENDENT_AMBULATORY_CARE_PROVIDER_SITE_OTHER): Payer: Medicare Other

## 2017-10-12 DIAGNOSIS — N390 Urinary tract infection, site not specified: Secondary | ICD-10-CM

## 2017-10-12 DIAGNOSIS — Z09 Encounter for follow-up examination after completed treatment for conditions other than malignant neoplasm: Secondary | ICD-10-CM | POA: Diagnosis not present

## 2017-10-12 DIAGNOSIS — Z87440 Personal history of urinary (tract) infections: Secondary | ICD-10-CM | POA: Diagnosis not present

## 2017-10-12 LAB — URINALYSIS, ROUTINE W REFLEX MICROSCOPIC
Bilirubin Urine: NEGATIVE
Hgb urine dipstick: NEGATIVE
Ketones, ur: NEGATIVE
Leukocytes, UA: NEGATIVE
Nitrite: NEGATIVE
RBC / HPF: NONE SEEN (ref 0–?)
Specific Gravity, Urine: 1.01 (ref 1.000–1.030)
Total Protein, Urine: NEGATIVE
Urine Glucose: NEGATIVE
Urobilinogen, UA: 0.2 (ref 0.0–1.0)
pH: 7 (ref 5.0–8.0)

## 2017-10-13 LAB — URINE CULTURE
MICRO NUMBER:: 90627718
SPECIMEN QUALITY:: ADEQUATE

## 2017-10-13 NOTE — Telephone Encounter (Signed)
Please advise Dr [name], thanks.   

## 2017-10-18 ENCOUNTER — Ambulatory Visit
Admission: RE | Admit: 2017-10-18 | Discharge: 2017-10-18 | Disposition: A | Discharge status: Home / Self Care | Payer: Medicare Other | Source: Ambulatory Visit | Attending: Neurology | Admitting: Neurology

## 2017-10-18 ENCOUNTER — Inpatient Hospital Stay: Payer: Medicare Other | Admitting: Internal Medicine

## 2017-10-18 DIAGNOSIS — R270 Ataxia, unspecified: Secondary | ICD-10-CM

## 2017-10-18 DIAGNOSIS — R42 Dizziness and giddiness: Secondary | ICD-10-CM

## 2017-10-18 NOTE — Progress Notes (Signed)
Tell pt urine culture showed  No predominant uti   Germ   If feeling ok no further  Repeat   At this time

## 2017-10-19 ENCOUNTER — Encounter: Payer: Self-pay | Admitting: *Deleted

## 2017-10-19 ENCOUNTER — Telehealth: Payer: Self-pay | Admitting: *Deleted

## 2017-10-19 NOTE — Telephone Encounter (Signed)
Results sent via My Chart.  

## 2017-10-19 NOTE — Telephone Encounter (Signed)
-----   Message from Pieter Partridge, DO sent at 10/19/2017  1:01 PM EDT ----- MRI of brain shows chronic changes from prior trigeminal nerve surgery but no stroke or anything concerning to explain the dizziness. Unfortunately, I do not know the cause of her symptoms.  She should follow up with her PCP for further management.

## 2017-10-26 ENCOUNTER — Encounter: Payer: Self-pay | Admitting: Internal Medicine

## 2017-10-31 DIAGNOSIS — H04123 Dry eye syndrome of bilateral lacrimal glands: Secondary | ICD-10-CM | POA: Diagnosis not present

## 2017-11-06 ENCOUNTER — Other Ambulatory Visit: Payer: Self-pay | Admitting: Internal Medicine

## 2017-11-06 NOTE — Telephone Encounter (Signed)
I agree that  Consult with neurosurgery  or other specialist is in order .   Sorry you are having this pain progression again .   Do we need to do  A referral or can you  Get access since you had surgery in past ? apologies about late reply I was out of town when message came to me.

## 2017-11-15 ENCOUNTER — Other Ambulatory Visit: Payer: Self-pay | Admitting: Internal Medicine

## 2017-11-17 ENCOUNTER — Other Ambulatory Visit: Payer: Self-pay

## 2017-11-17 NOTE — Telephone Encounter (Signed)
Rx done. 

## 2017-11-17 NOTE — Telephone Encounter (Signed)
Copied from [place] [id number]. Topic: Quick Communication - Rx Refill/Question >> Nov 17, 2017 12:40 PM Jarold Motto, Fraser Din wrote: Medication:metoprolol tartrate (LOPRESSOR) 50 MG tablet   Preferred Pharmacy (with phone number or street name):[facility], [place] [phone] (Phone) [phone] (Fax)  Agent: Please be advised that RX refills may take up to 3 business days. We ask that you follow-up with your pharmacy.

## 2017-11-17 NOTE — Telephone Encounter (Signed)
Pt cancelled HFU Needs to reschedule follow up with Dr Regis Bill.  30 day supply can be sent OR enough to last up to her OV  Pt scheduled with Dr Regis Bill on 11/22/17 at 12pm Medication refilled #90 by Arville Go, CMA  Nothing further needed.

## 2017-11-17 NOTE — Telephone Encounter (Signed)
Pt is calling to check on status of medication refill. Please Advise .

## 2017-11-22 ENCOUNTER — Ambulatory Visit: Payer: Medicare Other | Admitting: Internal Medicine

## 2017-11-22 ENCOUNTER — Encounter: Payer: Self-pay | Admitting: Internal Medicine

## 2017-11-22 VITALS — BP 113/73 | HR 55 | Temp 97.9°F | Wt 139.0 lb

## 2017-11-22 DIAGNOSIS — Z87440 Personal history of urinary (tract) infections: Secondary | ICD-10-CM

## 2017-11-22 DIAGNOSIS — Z79899 Other long term (current) drug therapy: Secondary | ICD-10-CM | POA: Diagnosis not present

## 2017-11-22 DIAGNOSIS — I10 Essential (primary) hypertension: Secondary | ICD-10-CM | POA: Diagnosis not present

## 2017-11-22 DIAGNOSIS — Z87448 Personal history of other diseases of urinary system: Secondary | ICD-10-CM

## 2017-11-22 DIAGNOSIS — G500 Trigeminal neuralgia: Secondary | ICD-10-CM

## 2017-11-22 NOTE — Patient Instructions (Addendum)
See [place] or urology asap if any uti  Type  Sx. Like . DONTt wait.  Your blood pressure is good today but if getting   dizziness then   Check your blood pressure  To make sure not too low.    Can contact Dr Tomi Likens about other advice   For the [place] etc.   Plan  Preventive visit in about  5-6 months when possible or as needed.

## 2017-11-22 NOTE — Progress Notes (Signed)
Chief Complaint  Patient presents with  . Hospitalization Follow-up    HPI: [name] 77 y.o. come in f   Fu of number or issues    Had seen urology in past   . Different  Rcurrent. And then given premarin .  No infection since mothers day hospitalization  But seems to get nausea vomiting and terribly sick with utis  And not a lot of lower sx.   TG: Not too bad today  s and goes .  But  may need to go back to specialist    BP seems to be in range     ocasss dizzy feeling but no falling or neuro sx   No active bleeding   Neg resp sx  ROS: See pertinent positives and negatives per HPI. No recent fall cp sob bleeding  Past Medical History:  Diagnosis Date  . ADJ DISORDER WITH MIXED ANXIETY & DEPRESSED MOOD 03/03/2010   Qualifier: Diagnosis of  By: [name], [name]   . Closed head injury 02/01/2011   from syncope and had scalp laceration  neg ct .    Marland Kitchen Closed head injury 5-6 yrs ago  . Complication of anesthesia    migraine several hours after general anesthesia  . Fatty liver   . Gall stones 2016   see ct scan neg HIDA   . GERD (gastroesophageal reflux disease)   . Hearing aid worn   . HOH (hard of hearing)    both ears  . Hyperlipidemia   . Hypertension    echo nl lv function  mild dilitation 2009  . Kidney infection    few yrs ago in hospital  . Medication side effect 09/02/2010   Poss muscle se of 10 crestor   . Migraine    hypnic HA eval by Dr. Earley Favor in the past  . Polycythemia   . 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 Syncope 02/01/2011   In shower on vacation  sustained head laceration  8 sutures Had ed visit neg head ct labs and x ray   . Trigeminal neuralgia pain     Family History  Problem Relation Age of Onset  . Ovarian cancer Mother   . Stroke Mother   . Alcohol abuse Father   . Stroke Father   . Diabetes Brother   . Cancer Paternal Aunt        leukemia, unknown  type  . Seizures Daughter   . Hypertension Unknown   . Colon cancer Neg Hx     Social History   Socioeconomic History  . Marital status: Married    Spouse name: Not on file  . Number of children: 2  . Years of education: Not on file  . Highest education level: Not on file  Occupational History    Comment: retired Forensic psychologist  Social Needs  . Financial resource strain: Not on file  . Food insecurity:    Worry: Not on file    Inability: Not on file  . Transportation needs:    Medical: Not on file    Non-medical: Not on file  Tobacco Use  . Smoking status: Never Smoker  . Smokeless tobacco: Never Used  Substance and Sexual Activity  . Alcohol use: Yes    Alcohol/week: 1.2 oz    Types: 2 Glasses of wine per week    Comment: occ wine  .  Drug use: No  . Sexual activity: Not on file  Lifestyle  . Physical activity:    Days per week: Not on file    Minutes per session: Not on file  . Stress: Not on file  Relationships  . Social connections:    Talks on phone: Not on file    Gets together: Not on file    Attends religious service: Not on file    Active member of club or organization: Not on file    Attends meetings of clubs or organizations: Not on file    Relationship status: Not on file  Other Topics Concern  . Not on file  Social History Narrative   Married   HH of 2-3 (god daughter)   Pets 2 dogs   Non smoker    Child is a physician   G2P2             Outpatient Medications Prior to Visit  Medication Sig Dispense Refill  . albuterol (PROVENTIL HFA;VENTOLIN HFA) 108 (90 Base) MCG/ACT inhaler Inhale 2 puffs into the lungs every 6 (six) hours as needed. 1 Inhaler 2  . amLODipine (NORVASC) 10 MG tablet TAKE 1 TABLET BY MOUTH ONCE DAILY 90 tablet 1  . ASCOMP-CODEINE 50-325-40-30 MG capsule TAKE 1-2 CAPSULES BY MOUTH EVERY FOUR TO SIX HOURS AS NEEDED FOR MIGRAINE MAX OF 6 CAPSULES IN 24 HOURS 30 capsule 0  . benazepril (LOTENSIN) 20 MG tablet TAKE 1 TABLET  BY MOUTH ONCE DAILY 90 tablet 1  . budesonide-formoterol (SYMBICORT) 160-4.5 MCG/ACT inhaler Inhale 2 puffs into the lungs 2 (two) times daily. (Patient taking differently: Inhale 2 puffs into the lungs 2 (two) times daily as needed. ) 1 Inhaler 2  . chlorthalidone (HYGROTON) 25 MG tablet TAKE 1 TABLET BY MOUTH ONCE DAILY 90 tablet 1  . conjugated estrogens (PREMARIN) vaginal cream Place 1 Applicatorful vaginally 2 (two) times a week.    . EPITOL 200 MG tablet TAKE 2 TABLETS BY MOUTH TWICE DAILY 360 tablet 0  . fluticasone (FLONASE) 50 MCG/ACT nasal spray Place into both nostrils as needed for allergies or rhinitis.    . metoprolol tartrate (LOPRESSOR) 50 MG tablet TAKE 1/2 (ONE-HALF) TABLET BY MOUTH TWICE DAILY 90 tablet 0  . ranitidine (ZANTAC) 300 MG tablet Take 1 tablet (300 mg total) by mouth at bedtime. 90 tablet 3  . RESTASIS 0.05 % ophthalmic emulsion Place 1 drop into both eyes 2 (two) times daily.   1   No facility-administered medications prior to visit.      EXAM:  BP 113/73   Pulse (!) 55   Temp 97.9 F (36.6 C)   Wt 139 lb (63 kg)   BMI 22.44 kg/m   Body mass index is 22.44 kg/m.  GENERAL: vitals reviewed and listed above, alert, oriented, appears well hydrated and in no acute distress HEENT: atraumatic, conjunctiva  clear, no obvious abnormalities on inspection of external nose and ears OP : no lesion edema or exudate  NECK: no obvious masses on inspection palpation  LUNGS: clear to auscultation bilaterally, no wheezes, rales or rhonchi, good air movement CV: HRRR, no clubbing cyanosis or  peripheral edema nl cap refill  MS: moves all extremities without noticeable focal  abnormality PSYCH: pleasant and cooperative, no obvious depression or anxiety Lab Results  Component Value Date   WBC 5.9 09/07/2017   HGB 15.1 (H) 09/07/2017   HCT 43.5 09/07/2017   PLT 205.0 09/07/2017   GLUCOSE 79 09/07/2017  CHOL 233 (H) 05/30/2017   TRIG 61.0 05/30/2017   HDL 92.00  05/30/2017   LDLDIRECT 135.9 10/16/2012   LDLCALC 129 (H) 05/30/2017   ALT 20 09/07/2017   AST 23 09/07/2017   NA 132 (L) 09/07/2017   K 3.7 09/07/2017   CL 91 (L) 09/07/2017   CREATININE 0.73 09/07/2017   BUN 16 09/07/2017   CO2 33 (H) 09/07/2017   TSH 1.37 05/30/2017   INR 1.23 03/12/2015   BP Readings from Last 3 Encounters:  11/22/17 113/73  10/09/17 132/88  09/29/17 114/66   Wt Readings from Last 3 Encounters:  11/22/17 139 lb (63 kg)  10/09/17 139 lb 3.2 oz (63.1 kg)  09/29/17 140 lb (63.5 kg)   128/70  repeat bp today  In range and normal exam   ASSESSMENT AND PLAN:  Discussed the following assessment and plan:  Essential hypertension - controlled but check bp when dizzy incase need to  lower bp meds   Medication management  Trigeminal neuralgia - problematic comes and goes  may need another specilaty consutls  History of acute pyelonephritis - seems to recur  on premarin cream for prevention  advised to check early with any suspicious sx  Total visit 43mns > 50% spent counseling and coordinating care as indicated in above note and in instructions to patient .   -Patient advised to return or notify health care team  if  new concerns arise.  Patient Instructions  See [place] as[place] if any uti  Type  Sx. Like . DONTt wait.  Your blood pressure is good today but if getting   dizziness then   Check your blood pressure  To make sure not too low.    Can contact Dr JTomi Likensabout other advice   For the [place] etc.   Plan  Preventive visit in about  5-6 months when possible or as needed.       WStandley Brooking Louna Rothgeb M.D.

## 2017-11-27 ENCOUNTER — Emergency Department (HOSPITAL_COMMUNITY): Payer: Medicare Other

## 2017-11-27 ENCOUNTER — Observation Stay (HOSPITAL_COMMUNITY)
Admission: EM | Admit: 2017-11-27 | Discharge: 2017-11-29 | Disposition: A | Discharge status: Home / Self Care | Payer: Medicare Other | Attending: Family Medicine | Admitting: Family Medicine

## 2017-11-27 ENCOUNTER — Other Ambulatory Visit: Payer: Self-pay

## 2017-11-27 ENCOUNTER — Encounter (HOSPITAL_COMMUNITY): Payer: Self-pay | Admitting: Emergency Medicine

## 2017-11-27 DIAGNOSIS — K529 Noninfective gastroenteritis and colitis, unspecified: Secondary | ICD-10-CM | POA: Diagnosis not present

## 2017-11-27 DIAGNOSIS — R001 Bradycardia, unspecified: Secondary | ICD-10-CM | POA: Diagnosis not present

## 2017-11-27 DIAGNOSIS — I499 Cardiac arrhythmia, unspecified: Secondary | ICD-10-CM | POA: Diagnosis not present

## 2017-11-27 DIAGNOSIS — R109 Unspecified abdominal pain: Secondary | ICD-10-CM | POA: Diagnosis not present

## 2017-11-27 DIAGNOSIS — Z79899 Other long term (current) drug therapy: Secondary | ICD-10-CM | POA: Insufficient documentation

## 2017-11-27 DIAGNOSIS — R188 Other ascites: Secondary | ICD-10-CM | POA: Insufficient documentation

## 2017-11-27 DIAGNOSIS — I10 Essential (primary) hypertension: Secondary | ICD-10-CM | POA: Insufficient documentation

## 2017-11-27 DIAGNOSIS — E785 Hyperlipidemia, unspecified: Secondary | ICD-10-CM | POA: Diagnosis not present

## 2017-11-27 DIAGNOSIS — K219 Gastro-esophageal reflux disease without esophagitis: Secondary | ICD-10-CM | POA: Insufficient documentation

## 2017-11-27 DIAGNOSIS — F4323 Adjustment disorder with mixed anxiety and depressed mood: Secondary | ICD-10-CM | POA: Insufficient documentation

## 2017-11-27 DIAGNOSIS — Z882 Allergy status to sulfonamides status: Secondary | ICD-10-CM | POA: Insufficient documentation

## 2017-11-27 DIAGNOSIS — R55 Syncope and collapse: Secondary | ICD-10-CM | POA: Diagnosis not present

## 2017-11-27 DIAGNOSIS — E871 Hypo-osmolality and hyponatremia: Secondary | ICD-10-CM | POA: Insufficient documentation

## 2017-11-27 DIAGNOSIS — K5100 Ulcerative (chronic) pancolitis without complications: Principal | ICD-10-CM | POA: Insufficient documentation

## 2017-11-27 DIAGNOSIS — I071 Rheumatic tricuspid insufficiency: Secondary | ICD-10-CM | POA: Insufficient documentation

## 2017-11-27 DIAGNOSIS — M1990 Unspecified osteoarthritis, unspecified site: Secondary | ICD-10-CM | POA: Diagnosis not present

## 2017-11-27 DIAGNOSIS — K5730 Diverticulosis of large intestine without perforation or abscess without bleeding: Secondary | ICD-10-CM | POA: Insufficient documentation

## 2017-11-27 DIAGNOSIS — Z885 Allergy status to narcotic agent status: Secondary | ICD-10-CM | POA: Diagnosis not present

## 2017-11-27 DIAGNOSIS — R197 Diarrhea, unspecified: Secondary | ICD-10-CM | POA: Diagnosis not present

## 2017-11-27 DIAGNOSIS — K760 Fatty (change of) liver, not elsewhere classified: Secondary | ICD-10-CM | POA: Insufficient documentation

## 2017-11-27 DIAGNOSIS — G500 Trigeminal neuralgia: Secondary | ICD-10-CM | POA: Diagnosis not present

## 2017-11-27 DIAGNOSIS — E876 Hypokalemia: Secondary | ICD-10-CM | POA: Diagnosis not present

## 2017-11-27 DIAGNOSIS — R42 Dizziness and giddiness: Secondary | ICD-10-CM | POA: Diagnosis not present

## 2017-11-27 HISTORY — DX: Noninfective gastroenteritis and colitis, unspecified: K52.9

## 2017-11-27 LAB — I-STAT TROPONIN, ED: Troponin i, poc: 0 ng/mL (ref 0.00–0.08)

## 2017-11-27 LAB — CBC WITH DIFFERENTIAL/PLATELET
Abs Immature Granulocytes: 0 10*3/uL (ref 0.0–0.1)
Basophils Absolute: 0.1 10*3/uL (ref 0.0–0.1)
Basophils Relative: 1 %
Eosinophils Absolute: 0.2 10*3/uL (ref 0.0–0.7)
Eosinophils Relative: 2 %
HCT: 47.1 % — ABNORMAL HIGH (ref 36.0–46.0)
Hemoglobin: 15.9 g/dL — ABNORMAL HIGH (ref 12.0–15.0)
Immature Granulocytes: 0 %
Lymphocytes Relative: 11 %
Lymphs Abs: 0.8 10*3/uL (ref 0.7–4.0)
MCH: 30.5 pg (ref 26.0–34.0)
MCHC: 33.8 g/dL (ref 30.0–36.0)
MCV: 90.4 fL (ref 78.0–100.0)
Monocytes Absolute: 0.6 10*3/uL (ref 0.1–1.0)
Monocytes Relative: 8 %
Neutro Abs: 6 10*3/uL (ref 1.7–7.7)
Neutrophils Relative %: 78 %
Platelets: 228 10*3/uL (ref 150–400)
RBC: 5.21 MIL/uL — ABNORMAL HIGH (ref 3.87–5.11)
RDW: 13.5 % (ref 11.5–15.5)
WBC: 7.7 10*3/uL (ref 4.0–10.5)

## 2017-11-27 LAB — URINALYSIS, ROUTINE W REFLEX MICROSCOPIC
Bilirubin Urine: NEGATIVE
Glucose, UA: NEGATIVE mg/dL
Hgb urine dipstick: NEGATIVE
Ketones, ur: NEGATIVE mg/dL
Leukocytes, UA: NEGATIVE
Nitrite: NEGATIVE
Protein, ur: NEGATIVE mg/dL
Specific Gravity, Urine: 1.012 (ref 1.005–1.030)
pH: 6 (ref 5.0–8.0)

## 2017-11-27 LAB — COMPREHENSIVE METABOLIC PANEL
ALT: 33 U/L (ref 0–44)
AST: 46 U/L — ABNORMAL HIGH (ref 15–41)
Albumin: 4 g/dL (ref 3.5–5.0)
Alkaline Phosphatase: 99 U/L (ref 38–126)
Anion gap: 9 (ref 5–15)
BUN: 22 mg/dL (ref 8–23)
CO2: 29 mmol/L (ref 22–32)
Calcium: 9.1 mg/dL (ref 8.9–10.3)
Chloride: 92 mmol/L — ABNORMAL LOW (ref 98–111)
Creatinine, Ser: 1.18 mg/dL — ABNORMAL HIGH (ref 0.44–1.00)
GFR calc Af Amer: 50 mL/min — ABNORMAL LOW (ref >60)
GFR calc non Af Amer: 43 mL/min — ABNORMAL LOW (ref >60)
Glucose, Bld: 140 mg/dL — ABNORMAL HIGH (ref 70–99)
Potassium: 3.7 mmol/L (ref 3.5–5.1)
Sodium: 130 mmol/L — ABNORMAL LOW (ref 135–145)
Total Bilirubin: 1 mg/dL (ref 0.3–1.2)
Total Protein: 6.1 g/dL — ABNORMAL LOW (ref 6.5–8.1)

## 2017-11-27 LAB — CBG MONITORING, ED: Glucose-Capillary: 109 mg/dL — ABNORMAL HIGH (ref 70–99)

## 2017-11-27 MED ORDER — LACTATED RINGERS IV SOLN
INTRAVENOUS | Status: DC
  Administered 2017-11-27: 23:00:00 via INTRAVENOUS

## 2017-11-27 MED ORDER — ONDANSETRON 4 MG PO TBDP
4.0000 mg | ORAL_TABLET | Freq: Once | ORAL | Status: AC
  Administered 2017-11-27: 4 mg via ORAL
  Filled 2017-11-27: qty 1

## 2017-11-27 MED ORDER — SODIUM CHLORIDE 0.9 % IV BOLUS
500.0000 mL | Freq: Once | INTRAVENOUS | Status: AC
  Administered 2017-11-27: 500 mL via INTRAVENOUS

## 2017-11-27 MED ORDER — DICYCLOMINE HCL 10 MG PO CAPS
10.0000 mg | ORAL_CAPSULE | Freq: Once | ORAL | Status: AC
  Administered 2017-11-27: 10 mg via ORAL
  Filled 2017-11-27: qty 1

## 2017-11-27 MED ORDER — LOPERAMIDE HCL 2 MG PO CAPS
4.0000 mg | ORAL_CAPSULE | Freq: Once | ORAL | Status: AC
Start: 2017-11-27 — End: 2017-11-27
  Administered 2017-11-27: 4 mg via ORAL
  Filled 2017-11-27: qty 2

## 2017-11-27 MED ORDER — IOHEXOL 300 MG/ML  SOLN
80.0000 mL | Freq: Once | INTRAMUSCULAR | Status: AC | PRN
  Administered 2017-11-27: 80 mL via INTRAVENOUS

## 2017-11-27 NOTE — ED Provider Notes (Signed)
Emergency Department Provider Note   I have reviewed the triage vital signs and the nursing notes.   HISTORY  Chief Complaint Loss of Consciousness   HPI [name] is a 77 y.o. female with PMH of GERD, HTN, HLD, and remote history of syncope presents to the emergency department for evaluation after syncope.  The patient states she was driving and had severe abdominal cramping and felt like she needed to have a bowel movement.  She pulled over the [employer] and was walking inside when she felt very lightheaded, hot all over, sweaty.  She felt like she was going to pass out so grabbed onto a chair and sat herself down but then proceeded to have a syncopal event.  She denies any chest pain, palpitations, shortness of breath prior to passing out. EMS was called and the patient was transported to the emergency department.  She states she had a bowel movement shortly after arrival and it was formed and nonbloody.  Denies any nausea or vomiting.   Past Medical History:  Diagnosis Date  . ADJ DISORDER WITH MIXED ANXIETY & DEPRESSED MOOD 03/03/2010   Qualifier: Diagnosis of  By: [name], [name]   . Closed head injury 02/01/2011   from syncope and had scalp laceration  neg ct .    Marland Kitchen Closed head injury 5-6 yrs ago  . Complication of anesthesia    migraine several hours after general anesthesia  . Fatty liver   . Gall stones 2016   see ct scan neg HIDA   . GERD (gastroesophageal reflux disease)   . Hearing aid worn   . HOH (hard of hearing)    both ears  . Hyperlipidemia   . Hypertension    echo nl lv function  mild dilitation 2009  . Kidney infection    few yrs ago in hospital  . Medication side effect 09/02/2010   Poss muscle se of 10 crestor   . Migraine    hypnic HA eval by Dr. Earley Favor in the past  . Polycythemia   . 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 Syncope 02/01/2011   In shower on vacation   sustained head laceration  8 sutures Had ed visit neg head ct labs and x ray   . Trigeminal neuralgia pain     Patient Active Problem List   Diagnosis Date Noted  . Colitis 11/27/2017  . Diarrhea 11/27/2017  . Closed head injury 11/27/2015  . Unilateral headache 05/26/2015  . Pyelonephritis 03/12/2015  . ARF (acute renal failure) ([place]) 03/12/2015  . Gallstones 03/12/2015  . Nausea without vomiting 12/10/2014  . Essential hypertension 05/29/2014  . 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 Abdominal pain 05/29/2013  . Agent resistant to multiple antibiotics 05/29/2013  . Low back pain radiating to right leg 01/16/2013  . Sinus problem 11/20/2012  . Chronic headaches morning 10/19/2012  . Low sodium levels  133 10/19/2012  . Leg pain, posterior 08/16/2012  . Sciatic neuritis 08/16/2012  . Sleep disturbance, unspecified 08/16/2012  . Medication management 03/24/2012  . Preventative health care 09/24/2011  . Trigeminal neuralgia 09/24/2011  . Postmenopausal HRT (hormone replacement therapy) 09/24/2011  . Polycythemia 09/20/2011  . Syncope, vasovagal 02/01/2011  . Morning headache 02/01/2011  . Hearing aid worn   . Abnormal blood finding   . LOCALIZED SUPERFICIAL SWELLING MASS OR LUMP 09/21/2009  . DYSPNEA 05/07/2008  . Hypnic headache  03/19/2008  . 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 Headache(784.0) 06/05/2007  . JAW PAIN 04/06/2007  . OSTEOARTHRITIS 04/06/2007  . ABNORMAL RESULT, FUNCTION STUDY, Drew 02/05/2007  . HYPERLIPIDEMIA 12/25/2006  . HYPERTENSION 12/25/2006    Past Surgical History:  Procedure Laterality Date  . ABDOMINAL HYSTERECTOMY  2002   tubal  . CARDIAC CATHETERIZATION  2000   chest pains neg  . CHOLECYSTECTOMY N/A 02/21/2017   Procedure: LAPAROSCOPIC CHOLECYSTECTOMY WITH INTRAOPERATIVE CHOLANGIOGRAM;  Surgeon: Armandina Gemma, MD;  Location: [facility];  Service: General;  Laterality: N/A;  . COLONOSCOPY     multiple  . CRANIOTOMY   12/09/2011   nerve decompression right trigeminal   . DOPPLER ECHOCARDIOGRAPHY  2009   nl lv function mild lv dilitation  . EYE SURGERY Bilateral    ioc for catatracts  . laparoscopic gallbladder surgery  02/16/2017   Fax from [facility]  . OOPHORECTOMY Bilateral 2002  . rt shoulder surgery      Allergies Hydrocodone; Oxycodone; and Sulfamethoxazole-trimethoprim  Family History  Problem Relation Age of Onset  . Ovarian cancer Mother   . Stroke Mother   . Alcohol abuse Father   . Stroke Father   . Diabetes Brother   . Cancer Paternal Aunt        leukemia, unknown type  . Seizures Daughter   . Hypertension Unknown   . Colon cancer Neg Hx     Social History Social History   Tobacco Use  . Smoking status: Never Smoker  . Smokeless tobacco: Never Used  Substance Use Topics  . Alcohol use: Yes    Alcohol/week: 1.2 oz    Types: 2 Glasses of wine per week    Comment: occ wine  . Drug use: No    Review of Systems  Constitutional: No fever/chills Eyes: No visual changes. ENT: No sore throat. Cardiovascular: Denies chest pain. Positive syncope.  Respiratory: Denies shortness of breath. Gastrointestinal: Positive mild abdominal cramping pain.  No nausea, no vomiting.  No diarrhea.  No constipation. Genitourinary: Negative for dysuria. Musculoskeletal: Negative for back pain. Skin: Negative for rash. Neurological: Negative for headaches, focal weakness or numbness.  10-point ROS otherwise negative.  ____________________________________________   PHYSICAL EXAM:  VITAL SIGNS: Vitals:   11/27/17 1442  SpO2: 97%   Vitals:   11/28/17 0906 11/28/17 1029  BP:  (!) 149/76  Pulse:  64  Resp:    Temp: 97.9 F (36.6 C)   SpO2:       Constitutional: Alert and oriented. Well appearing and in no acute distress. Eyes: Conjunctivae are normal.  Head: Atraumatic. Nose: No congestion/rhinnorhea. Mouth/Throat: Mucous membranes are moist.   Neck: No  stridor.  Cardiovascular: Normal rate, regular rhythm. Good peripheral circulation. Grossly normal heart sounds.   Respiratory: Normal respiratory effort.  No retractions. Lungs CTAB. Gastrointestinal: Soft and nontender. No distention.  Musculoskeletal: No lower extremity tenderness nor edema. No gross deformities of extremities. Neurologic:  Normal speech and language. No gross focal neurologic deficits are appreciated.  Skin:  Skin is warm, dry and intact. No rash noted.  ____________________________________________   LABS (all labs ordered are listed, but only abnormal results are displayed)  Labs Reviewed  URINALYSIS, ROUTINE W REFLEX MICROSCOPIC - Abnormal; Notable for the following components:      Result Value   APPearance HAZY (*)    All other components within normal limits  CBC WITH DIFFERENTIAL/PLATELET - Abnormal; Notable for the following components:   RBC 5.21 (*)  Hemoglobin 15.9 (*)    HCT 47.1 (*)    All other components within normal limits  COMPREHENSIVE METABOLIC PANEL - Abnormal; Notable for the following components:   Sodium 130 (*)    Chloride 92 (*)    Glucose, Bld 140 (*)    Creatinine, Ser 1.18 (*)    Total Protein 6.1 (*)    AST 46 (*)    GFR calc non Af Amer 43 (*)    GFR calc Af Amer 50 (*)    All other components within normal limits  CBG MONITORING, ED - Abnormal; Notable for the following components:   Glucose-Capillary 109 (*)    All other components within normal limits  C DIFFICILE QUICK SCREEN W PCR REFLEX  GASTROINTESTINAL PANEL BY PCR, STOOL (REPLACES STOOL CULTURE)  I-STAT TROPONIN, ED   ____________________________________________  EKG   EKG Interpretation  Date/Time:  Monday November 27 2017 15:41:29 EDT Ventricular Rate:  53 PR Interval:    QRS Duration: 87 QT Interval:  448 QTC Calculation: 421 R Axis:   74 Text Interpretation:  Sinus rhythm Anterior infarct, old No STEMI.  Confirmed by Nanda Quinton [phone]) on 11/27/2017  3:48:43 PM       ____________________________________________  RADIOLOGY  Ct Abdomen Pelvis W Contrast  Result Date: 11/27/2017 CLINICAL DATA:  77 y/o  F; syncopal events.  Abdominal pain. EXAM: CT ABDOMEN AND PELVIS WITH CONTRAST TECHNIQUE: Multidetector CT imaging of the abdomen and pelvis was performed using the standard protocol following bolus administration of intravenous contrast. CONTRAST:  47m OMNIPAQUE IOHEXOL 300 MG/ML  SOLN COMPARISON:  03/12/2015 CT abdomen and pelvis. FINDINGS: Lower chest: No acute abnormality. Hepatobiliary: No focal liver abnormality is seen. Status post cholecystectomy. No biliary dilatation. Pancreas: Unremarkable. No pancreatic ductal dilatation or surrounding inflammatory changes. Spleen: Normal in size without focal abnormality. Adrenals/Urinary Tract: Adrenal glands are unremarkable. Multiple renal cysts measuring up to 2.8 cm in the right interpolar kidney. Otherwise, kidneys are normal, without renal calculi, focal lesion, or hydronephrosis. Bladder is unremarkable. Stomach/Bowel: Acute pancolitis with mucosal enhancement and wall thickening. Moderate sigmoid diverticulosis. Appendix not identified, no secondary signs of acute appendicitis. No small bowel inflammation or obstructive changes. Diffuse fecalization of small bowel contents suggests dysmotility. Stomach is unremarkable. Vascular/Lymphatic: Aortic atherosclerosis. No enlarged abdominal or pelvic lymph nodes. Reproductive: Status post hysterectomy. No adnexal masses. Other: No abdominal wall hernia or abnormality. Trace volume of ascites. Musculoskeletal: No fracture is seen. Mild spondylosis of the lumbar spine. IMPRESSION: 1. Acute pancolitis.  No findings of perforation or abscess. 2. Sigmoid diverticulosis without findings of acute diverticulitis. Electronically Signed   By: LKristine GarbeM.D.   On: 11/27/2017 22:09     ____________________________________________   PROCEDURES  Procedure(s) performed:   Procedures  None ____________________________________________   INITIAL IMPRESSION / ASSESSMENT AND PLAN / ED COURSE  Pertinent labs & imaging results that were available during my care of the patient were reviewed by me and considered in my medical decision making (see chart for details).  Patient presents to the emergency department for evaluation after syncope.  She had the sensation that she needed to have a bowel movement with abdominal cramping.  In the setting she felt very hot and sweaty and proceeded to have a syncopal event.  No report of seizure activity on scene.  Symptoms are consistent most with vasovagal episode.  Patient's abdomen is completely soft non-tender. Afebrile here. Plan for labs and EKG.   06:51 PM Labs reviewed. CMP needs to  be re-drawn. On re-evaluation the patient in complaining of more abdominal pain. Some tenderness appreciated in the LLQ. Plan for CT and follow CMP.   CMP reviewed. CT shows pan-colitis. Patient not on abx recently. Last course in May 2019. She has been back and fourth to the bathroom with multiple episodes of diarrhea while waiting in the ED. Pain continues. Plan for obs admission for IVF, bowel rest, and will send c. Diff. No abx for now. Doubt sepsis with no SIRS criteria met.   Discussed patient's case with Hospitalist, Dr. Maudie Mercury to request admission. Patient and family (if present) updated with plan. Care transferred to Hospitalist service.  I reviewed all nursing notes, vitals, pertinent old records, EKGs, labs, imaging (as available).  ____________________________________________  FINAL CLINICAL IMPRESSION(S) / ED DIAGNOSES  Final diagnoses:  Pancolitis ([place])  Syncope and collapse     MEDICATIONS GIVEN DURING THIS VISIT:  Medications  lactated ringers infusion ( Intravenous New Bag/Given 11/27/17 2304)  sodium chloride 0.9 % bolus  500 mL (0 mLs Intravenous Stopped 11/27/17 1740)  dicyclomine (BENTYL) capsule 10 mg (10 mg Oral Given 11/27/17 1739)  ondansetron (ZOFRAN-ODT) disintegrating tablet 4 mg (4 mg Oral Given 11/27/17 1739)  iohexol (OMNIPAQUE) 300 MG/ML solution 80 mL (80 mLs Intravenous Contrast Given 11/27/17 2135)  loperamide (IMODIUM) capsule 4 mg (4 mg Oral Given 11/27/17 2304)    Note:  This document was prepared using Dragon voice recognition software and may include unintentional dictation errors.  Nanda Quinton, MD Emergency Medicine    Dayrin Stallone, [facility], MD 11/28/17 1058

## 2017-11-27 NOTE — H&P (Signed)
TRH H&P   Patient Demographics:    [name], is a 77 y.o. female  MRN: [id number]   DOB - 1940/11/25  Admit Date - 11/27/2017  Outpatient Primary MD for the patient is [name], [name], MD  Referring MD/NP/PA:  Nanda Quinton  Outpatient Specialists:   Patient coming from: home  Chief Complaint  Patient presents with  . Loss of Consciousness      HPI:    [name]  is a 77 y.o. female, w hypertension, hyperlipidemia, fatty liver, migraines,  trigeminal neuralgia, h/o prior syncope 2012 (? Vasovagal), apparently was having some cramping of her abdomen and went to [employer] to the bathroom to have a bm and felt like she was going to pass out and slowly sat down. Felt clammy , and then passed out.  Pt denies any cp, palp, sob, seizure activity , focal neurological symptoms, brbpr, black stool. Pt denies any recent odd food eaten or travel. Pt notes having been on abx in the past 3 months.   In ED,  IMPRESSION: 1. Acute pancolitis.  No findings of perforation or abscess. 2. Sigmoid diverticulosis without findings of acute diverticulitis.  Wbc 7.7, Hgb 15.9, Plt 228 Trop 0.00 Na 130, K 3.7, Bun 22, Creatinine 1.18 Ast 46, Alt 33, Alk phos 99, T. Bili 1.0  Urinalysis negative  Pt will be admitted for ? Diarrhea, colitis, as well as syncope.      Review of systems:    In addition to the HPI above,  No Fever-chills, No Headache, No changes with Vision or hearing, No problems swallowing food or Liquids, No Chest pain, Cough or Shortness of Breath, No Abdominal pain, No Nausea or Vommitting, No Blood in stool or Urine, No dysuria, No new skin rashes or bruises, No new joints pains-aches,  No new weakness, tingling, numbness in any extremity, No recent weight gain or loss, No polyuria, polydypsia or polyphagia, No significant Mental Stressors.  A full 10 point  Review of Systems was done, except as stated above, all other Review of Systems were negative.   With Past History of the following :    Past Medical History:  Diagnosis Date  . ADJ DISORDER WITH MIXED ANXIETY & DEPRESSED MOOD 03/03/2010   Qualifier: Diagnosis of  By: [name], [name]   . Closed head injury 02/01/2011   from syncope and had scalp laceration  neg ct .    Marland Kitchen Closed head injury 5-6 yrs ago  . Complication of anesthesia    migraine several hours after general anesthesia  . Fatty liver   . Gall stones 2016   see ct scan neg HIDA   . GERD (gastroesophageal reflux disease)   . Hearing aid worn   . HOH (hard of hearing)    both ears  . Hyperlipidemia   . Hypertension    echo nl lv function  mild dilitation 2009  . Kidney infection    few yrs ago in hospital  . Medication side effect 09/02/2010   Poss muscle se of 10 crestor   . Migraine    hypnic HA eval by Dr. Earley Favor in the past  . Polycythemia   . 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 Syncope 02/01/2011   In shower on vacation  sustained head laceration  8 sutures Had ed visit neg head ct labs and x ray   . Trigeminal neuralgia pain       Past Surgical History:  Procedure Laterality Date  . ABDOMINAL HYSTERECTOMY  2002   tubal  . CARDIAC CATHETERIZATION  2000   chest pains neg  . CHOLECYSTECTOMY N/A 02/21/2017   Procedure: LAPAROSCOPIC CHOLECYSTECTOMY WITH INTRAOPERATIVE CHOLANGIOGRAM;  Surgeon: Armandina Gemma, MD;  Location: [facility];  Service: General;  Laterality: N/A;  . COLONOSCOPY     multiple  . CRANIOTOMY  12/09/2011   nerve decompression right trigeminal   . DOPPLER ECHOCARDIOGRAPHY  2009   nl lv function mild lv dilitation  . EYE SURGERY Bilateral    ioc for catatracts  . laparoscopic gallbladder surgery  02/16/2017   Fax from [facility]  . OOPHORECTOMY Bilateral 2002  . rt shoulder surgery        Social History:      Social History   Tobacco Use  . Smoking status: Never Smoker  . Smokeless tobacco: Never Used  Substance Use Topics  . Alcohol use: Yes    Alcohol/week: 1.2 oz    Types: 2 Glasses of wine per week    Comment: occ wine     Lives - at home  Mobility - walks by self   Family History :     Family History  Problem Relation Age of Onset  . Ovarian cancer Mother   . Stroke Mother   . Alcohol abuse Father   . Stroke Father   . Diabetes Brother   . Cancer Paternal Aunt        leukemia, unknown type  . Seizures Daughter   . Hypertension Unknown   . Colon cancer Neg Hx        Home Medications:   Prior to Admission medications   Medication Sig Start Date End Date Taking? Authorizing Provider  albuterol (PROVENTIL HFA;VENTOLIN HFA) 108 (90 Base) MCG/ACT inhaler Inhale 2 puffs into the lungs every 6 (six) hours as needed. 04/26/16  Yes [name], [name], MD  amLODipine (NORVASC) 10 MG tablet TAKE 1 TABLET BY MOUTH ONCE DAILY 08/21/17  Yes [name], [name], MD  ASCOMP-CODEINE [phone] MG capsule TAKE 1-2 CAPSULES BY MOUTH EVERY FOUR TO SIX HOURS AS NEEDED FOR MIGRAINE MAX OF 6 CAPSULES IN 24 HOURS 03/04/16  Yes [name], [name], MD  benazepril (LOTENSIN) 20 MG tablet TAKE 1 TABLET BY MOUTH ONCE DAILY 11/07/17  Yes [name], [name], MD  budesonide-formoterol (SYMBICORT) 160-4.5 MCG/ACT inhaler Inhale 2 puffs into the lungs 2 (two) times daily. Patient taking differently: Inhale 2 puffs into the lungs 2 (two) times daily as needed.  07/16/15  Yes [name], [name], MD  chlorthalidone (HYGROTON) 25 MG tablet TAKE 1 TABLET BY MOUTH ONCE DAILY 08/09/17  Yes [name], [name], MD  conjugated estrogens (PREMARIN) vaginal cream Place 1 Applicatorful vaginally 2 (two) times a week.   Yes [provider]  EPITOL 200 MG tablet TAKE  2 TABLETS BY MOUTH TWICE DAILY 06/02/17  Yes [name], [name], MD  fluticasone (FLONASE) 50 MCG/ACT nasal spray Place 2 sprays into both nostrils as needed for  allergies or rhinitis.    Yes [provider]  metoprolol tartrate (LOPRESSOR) 50 MG tablet TAKE 1/2 (ONE-HALF) TABLET BY MOUTH TWICE DAILY 11/17/17  Yes [name], [name], MD  ranitidine (ZANTAC) 300 MG tablet Take 1 tablet (300 mg total) by mouth at bedtime. 09/26/17  Yes [name], MD  RESTASIS 0.05 % ophthalmic emulsion Place 1 drop into both eyes 2 (two) times daily.  05/17/16  Yes [provider]     Allergies:     Allergies  Allergen Reactions  . Hydrocodone Other (See Comments)    Rebound headaches  . Oxycodone Other (See Comments)    Rebound headaches  . Sulfamethoxazole-Trimethoprim Other (See Comments)    REACTION: unspecified     Physical Exam:   Vitals  Height 5' 7" (1.702 m), weight 61.2 kg (135 lb), SpO2 97 %.   1. General  lying in bed in NAD,   2. Normal affect and insight, Not Suicidal or Homicidal, Awake Alert, Oriented X 3.  3. No F.N deficits, ALL C.Nerves Intact, Strength 5/5 all 4 extremities, Sensation intact all 4 extremities, Plantars down going.  4. Ears and Eyes appear Normal, Conjunctivae clear, PERRLA. Moist Oral Mucosa.  5. Supple Neck, No JVD, No cervical lymphadenopathy appriciated, No Carotid Bruits.  6. Symmetrical Chest wall movement, Good air movement bilaterally, CTAB.  7. RRR, No Gallops, Rubs or Murmurs, No Parasternal Heave.  8. Positive Bowel Sounds, Abdomen Soft, No tenderness, No organomegaly appriciated,No rebound -guarding or rigidity.  9.  No Cyanosis, Normal Skin Turgor, No Skin Rash or Bruise.  10. Good muscle tone,  joints appear normal , no effusions, Normal ROM.  11. No Palpable Lymph Nodes in Neck or Axillae      Data Review:    CBC Recent Labs  Lab 11/27/17 1537  WBC 7.7  HGB 15.9*  HCT 47.1*  PLT 228  MCV 90.4  MCH 30.5  MCHC 33.8  RDW 13.5  LYMPHSABS 0.8  MONOABS 0.6  EOSABS 0.2  BASOSABS 0.1    ------------------------------------------------------------------------------------------------------------------  Chemistries  Recent Labs  Lab 11/27/17 1801  NA 130*  K 3.7  CL 92*  CO2 29  GLUCOSE 140*  BUN 22  CREATININE 1.18*  CALCIUM 9.1  AST 46*  ALT 33  ALKPHOS 99  BILITOT 1.0   ------------------------------------------------------------------------------------------------------------------ estimated creatinine clearance is 38 mL/min (A) (by C-G formula based on SCr of 1.18 mg/dL (H)). ------------------------------------------------------------------------------------------------------------------ No results for input(s): TSH, T4TOTAL, T3FREE, THYROIDAB in the last 72 hours.  Invalid input(s): FREET3  Coagulation profile No results for input(s): INR, PROTIME in the last 168 hours. ------------------------------------------------------------------------------------------------------------------- No results for input(s): DDIMER in the last 72 hours. -------------------------------------------------------------------------------------------------------------------  Cardiac Enzymes No results for input(s): CKMB, TROPONINI, MYOGLOBIN in the last 168 hours.  Invalid input(s): CK ------------------------------------------------------------------------------------------------------------------ No results found for: BNP   ---------------------------------------------------------------------------------------------------------------  Urinalysis    Component Value Date/Time   COLORURINE YELLOW 11/27/2017 1801   APPEARANCEUR HAZY (A) 11/27/2017 1801   LABSPEC 1.012 11/27/2017 1801   PHURINE 6.0 11/27/2017 1801   GLUCOSEU NEGATIVE 11/27/2017 1801   GLUCOSEU NEGATIVE 10/12/2017 1027   HGBUR NEGATIVE 11/27/2017 1801   HGBUR negative 02/25/2008 0857   BILIRUBINUR NEGATIVE 11/27/2017 1801   BILIRUBINUR neg 01/30/2017 [place] 11/27/2017 1801    PROTEINUR NEGATIVE 11/27/2017  1801   UROBILINOGEN 0.2 10/12/2017 1027   NITRITE NEGATIVE 11/27/2017 1801   LEUKOCYTESUR NEGATIVE 11/27/2017 1801    ----------------------------------------------------------------------------------------------------------------   Imaging Results:    Ct Abdomen Pelvis W Contrast  Result Date: 11/27/2017 CLINICAL DATA:  77 y/o  F; syncopal events.  Abdominal pain. EXAM: CT ABDOMEN AND PELVIS WITH CONTRAST TECHNIQUE: Multidetector CT imaging of the abdomen and pelvis was performed using the standard protocol following bolus administration of intravenous contrast. CONTRAST:  41m OMNIPAQUE IOHEXOL 300 MG/ML  SOLN COMPARISON:  03/12/2015 CT abdomen and pelvis. FINDINGS: Lower chest: No acute abnormality. Hepatobiliary: No focal liver abnormality is seen. Status post cholecystectomy. No biliary dilatation. Pancreas: Unremarkable. No pancreatic ductal dilatation or surrounding inflammatory changes. Spleen: Normal in size without focal abnormality. Adrenals/Urinary Tract: Adrenal glands are unremarkable. Multiple renal cysts measuring up to 2.8 cm in the right interpolar kidney. Otherwise, kidneys are normal, without renal calculi, focal lesion, or hydronephrosis. Bladder is unremarkable. Stomach/Bowel: Acute pancolitis with mucosal enhancement and wall thickening. Moderate sigmoid diverticulosis. Appendix not identified, no secondary signs of acute appendicitis. No small bowel inflammation or obstructive changes. Diffuse fecalization of small bowel contents suggests dysmotility. Stomach is unremarkable. Vascular/Lymphatic: Aortic atherosclerosis. No enlarged abdominal or pelvic lymph nodes. Reproductive: Status post hysterectomy. No adnexal masses. Other: No abdominal wall hernia or abnormality. Trace volume of ascites. Musculoskeletal: No fracture is seen. Mild spondylosis of the lumbar spine. IMPRESSION: 1. Acute pancolitis.  No findings of perforation or abscess. 2. Sigmoid  diverticulosis without findings of acute diverticulitis. Electronically Signed   By: LKristine GarbeM.D.   On: 11/27/2017 22:09    ekg  nsr at 53, nl axis, poor R progression, no st-t changes c/w ischemia   Assessment & Plan:    Principal Problem:   Colitis Active Problems:   Syncope, vasovagal   Abdominal pain   Diarrhea    Syncope likely vasovagal Tele Trop I q6h x3 CT brain Check Carotid ultrasound Check Cardiac echo  Abdominal pain, diarrhea secondary to colitis Check stool for C. Diff Check stool for GI pathogen panel  Hypertension Cont amlodipine 180mpo qday Cont benazepril 204mo qday Cont chlorthalidone 39m65m qday Cont lopressor 50mg85m po bid  Gerd Cont h2 blocker  ? Asthma Cont Symbicort     DVT Prophylaxis Lovenox - SCDs   AM Labs Ordered, also please review Full Orders  Family Communication: Admission, patients condition and plan of care including tests being ordered have been discussed with the patient  who indicate understanding and agree with the plan and Code Status.  Code Status  FULL CODE  Likely DC to   home  Condition GUARDED    Consults called: none  Admission status:  observation  Time spent in minutes : 60   JamesJani Gravelon 11/27/2017 at 11:27 PM  Between 7am to 7pm - Pager - [phone] 7pm go to [email or web address] - password [email or web address] - Office  [phone]

## 2017-11-27 NOTE — ED Notes (Signed)
Spoke with [place] in [place] lab to inquire about redrawn CMP. Per [place] she has it and is "receiving it right now."

## 2017-11-27 NOTE — ED Triage Notes (Signed)
Arrived via EMS. Patient was driving and stopped at a [employer] to have a bowel movement. While walking in syncope event fire department assisted to chair syncope for a second time. EMS reported initial BP  90/50 and repeat 111/60. CBG 93. Alert answering and following commands appropriate.

## 2017-11-27 NOTE — ED Notes (Signed)
Attempted urine collection x2.

## 2017-11-28 ENCOUNTER — Other Ambulatory Visit: Payer: Self-pay

## 2017-11-28 ENCOUNTER — Observation Stay (HOSPITAL_COMMUNITY): Payer: Medicare Other

## 2017-11-28 ENCOUNTER — Observation Stay (HOSPITAL_BASED_OUTPATIENT_CLINIC_OR_DEPARTMENT_OTHER): Payer: Medicare Other

## 2017-11-28 DIAGNOSIS — I5030 Unspecified diastolic (congestive) heart failure: Secondary | ICD-10-CM

## 2017-11-28 DIAGNOSIS — K529 Noninfective gastroenteritis and colitis, unspecified: Secondary | ICD-10-CM | POA: Diagnosis not present

## 2017-11-28 DIAGNOSIS — R55 Syncope and collapse: Secondary | ICD-10-CM | POA: Diagnosis not present

## 2017-11-28 LAB — CBC
HCT: 47.9 % — ABNORMAL HIGH (ref 36.0–46.0)
Hemoglobin: 16.3 g/dL — ABNORMAL HIGH (ref 12.0–15.0)
MCH: 29.8 pg (ref 26.0–34.0)
MCHC: 34 g/dL (ref 30.0–36.0)
MCV: 87.6 fL (ref 78.0–100.0)
Platelets: 201 10*3/uL (ref 150–400)
RBC: 5.47 MIL/uL — ABNORMAL HIGH (ref 3.87–5.11)
RDW: 12.6 % (ref 11.5–15.5)
WBC: 8.5 10*3/uL (ref 4.0–10.5)

## 2017-11-28 LAB — GASTROINTESTINAL PANEL BY PCR, STOOL (REPLACES STOOL CULTURE)

## 2017-11-28 LAB — TROPONIN I
Troponin I: <0.03 ng/mL (ref <0.03)
Troponin I: <0.03 ng/mL (ref <0.03)
Troponin I: <0.03 ng/mL (ref <0.03)

## 2017-11-28 LAB — COMPREHENSIVE METABOLIC PANEL
ALT: 28 U/L (ref 0–44)
AST: 31 U/L (ref 15–41)
Albumin: 3.4 g/dL — ABNORMAL LOW (ref 3.5–5.0)
Alkaline Phosphatase: 82 U/L (ref 38–126)
Anion gap: 11 (ref 5–15)
BUN: 12 mg/dL (ref 8–23)
CO2: 26 mmol/L (ref 22–32)
Calcium: 9 mg/dL (ref 8.9–10.3)
Chloride: 93 mmol/L — ABNORMAL LOW (ref 98–111)
Creatinine, Ser: 0.95 mg/dL (ref 0.44–1.00)
GFR calc Af Amer: >60 mL/min (ref >60)
GFR calc non Af Amer: 56 mL/min — ABNORMAL LOW (ref >60)
Glucose, Bld: 102 mg/dL — ABNORMAL HIGH (ref 70–99)
Potassium: 3.3 mmol/L — ABNORMAL LOW (ref 3.5–5.1)
Sodium: 130 mmol/L — ABNORMAL LOW (ref 135–145)
Total Bilirubin: 1.2 mg/dL (ref 0.3–1.2)
Total Protein: 5.6 g/dL — ABNORMAL LOW (ref 6.5–8.1)

## 2017-11-28 LAB — C DIFFICILE QUICK SCREEN W PCR REFLEX
C Diff antigen: NEGATIVE
C Diff interpretation: NOT DETECTED
C Diff toxin: NEGATIVE

## 2017-11-28 LAB — ECHOCARDIOGRAM COMPLETE
Height: 66 in
Weight: 2183.44 oz

## 2017-11-28 MED ORDER — METOPROLOL TARTRATE 12.5 MG HALF TABLET
12.5000 mg | ORAL_TABLET | Freq: Two times a day (BID) | ORAL | Status: DC
  Administered 2017-11-28: 12.5 mg via ORAL
  Filled 2017-11-28: qty 1

## 2017-11-28 MED ORDER — ACETAMINOPHEN 325 MG PO TABS
650.0000 mg | ORAL_TABLET | Freq: Four times a day (QID) | ORAL | Status: DC | PRN
  Administered 2017-11-29: 650 mg via ORAL
  Filled 2017-11-28 (×2): qty 2

## 2017-11-28 MED ORDER — CYCLOSPORINE 0.05 % OP EMUL
1.0000 [drp] | Freq: Two times a day (BID) | OPHTHALMIC | Status: DC
  Administered 2017-11-28 – 2017-11-29 (×4): 1 [drp] via OPHTHALMIC
  Filled 2017-11-28 (×4): qty 1

## 2017-11-28 MED ORDER — ACETAMINOPHEN 650 MG RE SUPP: 650.0000 mg | Freq: Four times a day (QID) | RECTAL | Status: DC | PRN

## 2017-11-28 MED ORDER — ENOXAPARIN SODIUM 40 MG/0.4ML ~~LOC~~ SOLN
40.0000 mg | SUBCUTANEOUS | Status: DC
  Administered 2017-11-28 – 2017-11-29 (×2): 40 mg via SUBCUTANEOUS
  Filled 2017-11-28 (×2): qty 0.4

## 2017-11-28 MED ORDER — METRONIDAZOLE IN NACL 5-0.79 MG/ML-% IV SOLN
500.0000 mg | Freq: Three times a day (TID) | INTRAVENOUS | Status: DC
  Administered 2017-11-28 – 2017-11-29 (×5): 500 mg via INTRAVENOUS
  Filled 2017-11-28 (×5): qty 100

## 2017-11-28 MED ORDER — CARBAMAZEPINE 200 MG PO TABS
400.0000 mg | ORAL_TABLET | Freq: Two times a day (BID) | ORAL | Status: DC
  Administered 2017-11-28 – 2017-11-29 (×4): 400 mg via ORAL
  Filled 2017-11-28 (×4): qty 2

## 2017-11-28 MED ORDER — ALBUTEROL SULFATE (2.5 MG/3ML) 0.083% IN NEBU: 3.0000 mL | INHALATION_SOLUTION | Freq: Four times a day (QID) | RESPIRATORY_TRACT | Status: DC | PRN

## 2017-11-28 MED ORDER — POTASSIUM CHLORIDE IN NACL 20-0.9 MEQ/L-% IV SOLN
INTRAVENOUS | Status: AC
  Administered 2017-11-28: 04:00:00 via INTRAVENOUS
  Filled 2017-11-28 (×2): qty 1000

## 2017-11-28 MED ORDER — MOMETASONE FURO-FORMOTEROL FUM 200-5 MCG/ACT IN AERO
2.0000 | INHALATION_SPRAY | Freq: Two times a day (BID) | RESPIRATORY_TRACT | Status: DC
  Administered 2017-11-28 – 2017-11-29 (×3): 2 via RESPIRATORY_TRACT
  Filled 2017-11-28: qty 8.8

## 2017-11-28 MED ORDER — BENAZEPRIL HCL 20 MG PO TABS
20.0000 mg | ORAL_TABLET | Freq: Every day | ORAL | Status: DC
  Administered 2017-11-28 – 2017-11-29 (×2): 20 mg via ORAL
  Filled 2017-11-28 (×2): qty 1

## 2017-11-28 MED ORDER — AMLODIPINE BESYLATE 10 MG PO TABS
10.0000 mg | ORAL_TABLET | Freq: Every day | ORAL | Status: DC
  Administered 2017-11-28 – 2017-11-29 (×2): 10 mg via ORAL
  Filled 2017-11-28 (×2): qty 1

## 2017-11-28 MED ORDER — ESTROGENS, CONJUGATED 0.625 MG/GM VA CREA
1.0000 | TOPICAL_CREAM | VAGINAL | Status: DC
  Filled 2017-11-28: qty 30

## 2017-11-28 MED ORDER — FAMOTIDINE 20 MG PO TABS
10.0000 mg | ORAL_TABLET | Freq: Two times a day (BID) | ORAL | Status: DC
  Administered 2017-11-28 – 2017-11-29 (×4): 10 mg via ORAL
  Filled 2017-11-28 (×4): qty 1

## 2017-11-28 MED ORDER — LOPERAMIDE HCL 2 MG PO CAPS
2.0000 mg | ORAL_CAPSULE | Freq: Every day | ORAL | Status: DC | PRN
Start: 2017-11-28 — End: 2017-11-29

## 2017-11-28 MED ORDER — FLUTICASONE PROPIONATE 50 MCG/ACT NA SUSP
2.0000 | Freq: Every day | NASAL | Status: DC | PRN
  Filled 2017-11-28: qty 16

## 2017-11-28 MED ORDER — CIPROFLOXACIN IN D5W 400 MG/200ML IV SOLN
400.0000 mg | Freq: Two times a day (BID) | INTRAVENOUS | Status: DC
  Administered 2017-11-28 – 2017-11-29 (×3): 400 mg via INTRAVENOUS
  Filled 2017-11-28 (×3): qty 200

## 2017-11-28 MED ORDER — CHLORTHALIDONE 25 MG PO TABS
25.0000 mg | ORAL_TABLET | Freq: Every day | ORAL | Status: DC
  Administered 2017-11-28 – 2017-11-29 (×2): 25 mg via ORAL
  Filled 2017-11-28 (×2): qty 1

## 2017-11-28 MED ORDER — BUTALBITAL-APAP-CAFFEINE 50-325-40 MG PO TABS
1.0000 | ORAL_TABLET | Freq: Four times a day (QID) | ORAL | Status: DC | PRN
  Administered 2017-11-29 (×2): 1 via ORAL
  Filled 2017-11-28 (×2): qty 1

## 2017-11-28 MED ORDER — ONDANSETRON HCL 4 MG/2ML IJ SOLN
4.0000 mg | Freq: Four times a day (QID) | INTRAMUSCULAR | Status: DC | PRN
  Administered 2017-11-29 (×2): 4 mg via INTRAVENOUS
  Filled 2017-11-28 (×2): qty 2

## 2017-11-28 NOTE — Progress Notes (Signed)
PROGRESS NOTE                                                                                                                                                                                                             Patient Demographics:    [name], is a 77 y.o. female, DOB - 12-06-1940, MWU:[id number]  Admit date - 11/27/2017   Admitting Physician Jani Gravel, MD  Outpatient Primary MD for the patient is [name], [name], MD  LOS - 0   Chief Complaint  Patient presents with  . Loss of Consciousness       Brief Narrative   77 y.o. female, w hypertension, hyperlipidemia, fatty liver, migraines,  trigeminal neuralgia, presents with syncope, diarrhea, work-up significant for dehydration and pancolitis.     Subjective:    [name] today has, No headache, No chest pain, reports no further abdominal pain, no nausea, no vomiting, reports diarrhea, with small blood clots .    Assessment  & Plan :    Principal Problem:   Colitis Active Problems:   Syncope, vasovagal   Abdominal pain   Diarrhea  Syncope -Likely in the setting of infectious process including pancolitis, dehydration, and multiple electrode abnormalities, with sodium of 130, potassium of 3.3. -Noted to be bradycardic initially, report this is her second syncope in month and a half, I will DC her metoprolol -Recent work-up as an outpatient including normal MRI brain and carotid Dopplers -Continue with gentle hydration, and correct electrolyte abnormalities. -Follow on 2D echo, continue with telemetry monitor  Pancolitis -Most likely infectious, as her husband ate from the same [place], he does have some diarrhea, but not as bad as her -Does report nausea on presentation, and diarrhea, currently small blood clots, this is most likely due to infectious etiology, C. difficile is negative, follow GI panel, meanwhile continue with Cipro and  Flagyl  Hyponatremia -Volume depletion, continue with IV fluids  Hypokalemia -Repleted, recheck in a.m.  Hypertension -Continue with home meds, PRN hydralazine, metoprolol stopped for bradycardia    Code Status : Full  Family Communication  : Husband at bedside  Disposition Plan  : home when stable   Consults  :  none  Procedures  : NONE  DVT Prophylaxis  : Lovenox  Lab Results  Component Value Date   PLT 201 11/28/2017  Antibiotics  :    Anti-infectives (From admission, onward)   Start     Dose/Rate Route Frequency Ordered Stop   11/28/17 0100  ciprofloxacin (CIPRO) IVPB 400 mg     400 mg 200 mL/hr over 60 Minutes Intravenous Every 12 hours 11/28/17 0018     11/28/17 0015  metroNIDAZOLE (FLAGYL) IVPB 500 mg     500 mg 100 mL/hr over 60 Minutes Intravenous Every 8 hours 11/28/17 0006          Objective:   Vitals:   11/28/17 0851 11/28/17 0906 11/28/17 1029 11/28/17 1309  BP:   (!) 149/76 138/76  Pulse: 62  64 64  Resp: 18   16  Temp:  97.9 F (36.6 C)  97.7 F (36.5 C)  TempSrc:  Oral  Oral  SpO2: 97%   99%  Weight:      Height:        Wt Readings from Last 3 Encounters:  11/28/17 61.9 kg (136 lb 7.4 oz)  11/22/17 63 kg (139 lb)  10/09/17 63.1 kg (139 lb 3.2 oz)     Intake/Output Summary (Last 24 hours) at 11/28/2017 1415 Last data filed at 11/28/2017 0344 Gross per 24 hour  Intake 470.83 ml  Output 200 ml  Net 270.83 ml     Physical Exam  Awake Alert, Oriented X 3, No new F.N deficits, Normal affect.  Symmetrical Chest wall movement, Good air movement bilaterally, CTAB RRR,No Gallops,Rubs or new Murmurs, No Parasternal Heave +ve B.Sounds, Abd Soft,  No rebound - guarding or rigidity. No Cyanosis, Clubbing or edema, No new Rash or bruise      Data Review:    CBC Recent Labs  Lab 11/27/17 1537 11/28/17 0623  WBC 7.7 8.5  HGB 15.9* 16.3*  HCT 47.1* 47.9*  PLT 228 201  MCV 90.4 87.6  MCH 30.5 29.8  MCHC 33.8 34.0  RDW 13.5  12.6  LYMPHSABS 0.8  --   MONOABS 0.6  --   EOSABS 0.2  --   BASOSABS 0.1  --     Chemistries  Recent Labs  Lab 11/27/17 1801 11/28/17 0623  NA 130* 130*  K 3.7 3.3*  CL 92* 93*  CO2 29 26  GLUCOSE 140* 102*  BUN 22 12  CREATININE 1.18* 0.95  CALCIUM 9.1 9.0  AST 46* 31  ALT 33 28  ALKPHOS 99 82  BILITOT 1.0 1.2   ------------------------------------------------------------------------------------------------------------------ No results for input(s): CHOL, HDL, LDLCALC, TRIG, CHOLHDL, LDLDIRECT in the last 72 hours.  No results found for: HGBA1C ------------------------------------------------------------------------------------------------------------------ No results for input(s): TSH, T4TOTAL, T3FREE, THYROIDAB in the last 72 hours.  Invalid input(s): FREET3 ------------------------------------------------------------------------------------------------------------------ No results for input(s): VITAMINB12, FOLATE, FERRITIN, TIBC, IRON, RETICCTPCT in the last 72 hours.  Coagulation profile No results for input(s): INR, PROTIME in the last 168 hours.  No results for input(s): DDIMER in the last 72 hours.  Cardiac Enzymes Recent Labs  Lab 11/28/17 0035 11/28/17 0623 11/28/17 1122  TROPONINI <0.03 <0.03 <0.03   ------------------------------------------------------------------------------------------------------------------ No results found for: BNP  Inpatient Medications  Scheduled Meds: . amLODipine  10 mg Oral Daily  . benazepril  20 mg Oral Daily  . carbamazepine  400 mg Oral BID  . chlorthalidone  25 mg Oral Daily  . [START ON 11/30/2017] conjugated estrogens  1 Applicatorful Vaginal Once per day on Mon Thu  . cycloSPORINE  1 drop Both Eyes BID  . enoxaparin (LOVENOX) injection  40 mg Subcutaneous Q24H  . famotidine  10 mg Oral BID  . mometasone-formoterol  2 puff Inhalation BID   Continuous Infusions: . 0.9 % NaCl with KCl 20 mEq / L 75 mL/hr at  11/28/17 0344  . ciprofloxacin 400 mg (11/28/17 1329)  . lactated ringers Stopped (11/28/17 0026)  . metronidazole 500 mg (11/28/17 0900)   PRN Meds:.acetaminophen **OR** acetaminophen, albuterol, butalbital-acetaminophen-caffeine, fluticasone, loperamide, ondansetron (ZOFRAN) IV  Micro Results Recent Results (from the past 240 hour(s))  C difficile quick scan w PCR reflex     Status: None   Collection Time: 11/28/17  5:47 AM  Result Value Ref Range Status   C Diff antigen NEGATIVE NEGATIVE Final   C Diff toxin NEGATIVE NEGATIVE Final   C Diff interpretation No C. difficile detected.  Final    Comment: Performed at [facility], [place] [address] [place], [place] [address]    Radiology Reports Ct Head Wo Contrast  Result Date: 11/28/2017 CLINICAL DATA:  Initial evaluation for acute syncope. EXAM: CT HEAD WITHOUT CONTRAST TECHNIQUE: Contiguous axial images were obtained from the base of the skull through the vertex without intravenous contrast. COMPARISON:  Comparison made with prior MRI from 10/18/2017 FINDINGS: Brain: Postoperative changes from prior right-sided trigeminal nerve decompression noted. Small amount of associated encephalomalacia within the right cerebellar hemisphere. No acute intracranial hemorrhage. No acute large vessel territory infarct. No mass lesion, midline shift or mass effect. No hydrocephalus. No extra-axial fluid collection. Vascular: No hyperdense vessel. Skull: Scalp soft tissues within normal limits. Prior right suboccipital craniotomy for trigeminal nerve decompression. Sinuses/Orbits: Globes and orbital soft tissues within normal limits. Mild scattered mucosal thickening within the ethmoidal air cells and left sphenoid sinus. Paranasal sinuses are otherwise clear. Trace right mastoid effusion noted. Other: None. IMPRESSION: 1. No acute intracranial abnormality. 2. Postoperative changes from prior right trigeminal nerve decompression. Electronically Signed   By:  Jeannine Boga M.D.   On: 11/28/2017 04:20   Ct Abdomen Pelvis W Contrast  Result Date: 11/27/2017 CLINICAL DATA:  77 y/o  F; syncopal events.  Abdominal pain. EXAM: CT ABDOMEN AND PELVIS WITH CONTRAST TECHNIQUE: Multidetector CT imaging of the abdomen and pelvis was performed using the standard protocol following bolus administration of intravenous contrast. CONTRAST:  67m OMNIPAQUE IOHEXOL 300 MG/ML  SOLN COMPARISON:  03/12/2015 CT abdomen and pelvis. FINDINGS: Lower chest: No acute abnormality. Hepatobiliary: No focal liver abnormality is seen. Status post cholecystectomy. No biliary dilatation. Pancreas: Unremarkable. No pancreatic ductal dilatation or surrounding inflammatory changes. Spleen: Normal in size without focal abnormality. Adrenals/Urinary Tract: Adrenal glands are unremarkable. Multiple renal cysts measuring up to 2.8 cm in the right interpolar kidney. Otherwise, kidneys are normal, without renal calculi, focal lesion, or hydronephrosis. Bladder is unremarkable. Stomach/Bowel: Acute pancolitis with mucosal enhancement and wall thickening. Moderate sigmoid diverticulosis. Appendix not identified, no secondary signs of acute appendicitis. No small bowel inflammation or obstructive changes. Diffuse fecalization of small bowel contents suggests dysmotility. Stomach is unremarkable. Vascular/Lymphatic: Aortic atherosclerosis. No enlarged abdominal or pelvic lymph nodes. Reproductive: Status post hysterectomy. No adnexal masses. Other: No abdominal wall hernia or abnormality. Trace volume of ascites. Musculoskeletal: No fracture is seen. Mild spondylosis of the lumbar spine. IMPRESSION: 1. Acute pancolitis.  No findings of perforation or abscess. 2. Sigmoid diverticulosis without findings of acute diverticulitis. Electronically Signed   By: LKristine GarbeM.D.   On: 11/27/2017 22:09     DPhillips ClimesM.D on 11/28/2017 at 2:15 PM  Between 7am to 7pm - Pager -  [phone] After  7pm go to [email or web address] - password [facility]  [facility] -  Office  [phone]

## 2017-11-28 NOTE — Progress Notes (Signed)
  Echocardiogram 2D Echocardiogram has been performed.  Darlina Sicilian M 11/28/2017, 9:38 AM

## 2017-11-28 NOTE — Care Management Note (Signed)
Case Management Note  Patient Details  Name: [name] MRN: [id number] Date of Birth: 1940/11/11  Subjective/Objective:             Presented with abdominal cramping and a syncopal episode. Resides with husband.                      Imaging revealed acute pancolitis        PCP: Dr.Wanda Ponosh  Kerria Sapien (Spouse)     [phone]          Action/Plan: Transition to home when medically stable.... NCM following for disposition needs  Expected Discharge Date:  12/01/17               Expected Discharge Plan:  Home/Self Care  In-House Referral:     Discharge planning Services     Post Acute Care Choice:    Choice offered to:     DME Arranged:    DME Agency:     HH Arranged:    HH Agency:     Status of Service:  In process, will continue to follow  If discussed at Long Length of Stay Meetings, dates discussed:    Additional Comments:  Sharin Mons, RN 11/28/2017, 1:50 PM

## 2017-11-28 NOTE — Progress Notes (Signed)
Pharmacy Antibiotic Note  [name] is a 77 y.o. female admitted on 11/27/2017 with abdominal cramping and a syncopal episode. Imaging revealing acute pancolitis with no perforation or abscess.  Pharmacy has been consulted for Ciprofloxacin along with Flagyl per MD.   Plan: - Start Ciprofloxacin 400 mg IV every 12 hours - Will continue to follow renal function, culture results, LOT, and antibiotic de-escalation plans   Height: 5' 6"  (167.6 cm) Weight: 136 lb 7.4 oz (61.9 kg) IBW/kg (Calculated) : 59.3  Temp (24hrs), Avg:98 F (36.7 C), Min:98 F (36.7 C), Max:98 F (36.7 C)  Recent Labs  Lab 11/27/17 1537 11/27/17 1801  WBC 7.7  --   CREATININE  --  1.18*    Estimated Creatinine Clearance: 36.8 mL/min (A) (by C-G formula based on SCr of 1.18 mg/dL (H)).    Allergies  Allergen Reactions  . Hydrocodone Other (See Comments)    Rebound headaches  . Oxycodone Other (See Comments)    Rebound headaches  . Sulfamethoxazole-Trimethoprim Other (See Comments)    REACTION: unspecified    Antimicrobials this admission: Cipro 7/9 >>  Dose adjustments this admission: n/a  Microbiology results: 7/9 Stool cx >> 7/9 CDiff cx >>  Thank you for allowing pharmacy to be a part of this patient's care.  Alycia Rossetti, PharmD, BCPS Clinical Pharmacist Pager: [phone] 11/28/2017 12:15 AM

## 2017-11-29 ENCOUNTER — Encounter (HOSPITAL_COMMUNITY): Payer: Medicare Other

## 2017-11-29 DIAGNOSIS — R109 Unspecified abdominal pain: Secondary | ICD-10-CM | POA: Diagnosis not present

## 2017-11-29 DIAGNOSIS — R197 Diarrhea, unspecified: Secondary | ICD-10-CM

## 2017-11-29 DIAGNOSIS — R55 Syncope and collapse: Secondary | ICD-10-CM | POA: Diagnosis not present

## 2017-11-29 DIAGNOSIS — K529 Noninfective gastroenteritis and colitis, unspecified: Secondary | ICD-10-CM | POA: Diagnosis not present

## 2017-11-29 LAB — BASIC METABOLIC PANEL
Anion gap: 11 (ref 5–15)
BUN: 7 mg/dL — ABNORMAL LOW (ref 8–23)
CO2: 27 mmol/L (ref 22–32)
Calcium: 8.7 mg/dL — ABNORMAL LOW (ref 8.9–10.3)
Chloride: 92 mmol/L — ABNORMAL LOW (ref 98–111)
Creatinine, Ser: 0.92 mg/dL (ref 0.44–1.00)
GFR calc Af Amer: >60 mL/min (ref >60)
GFR calc non Af Amer: 58 mL/min — ABNORMAL LOW (ref >60)
Glucose, Bld: 104 mg/dL — ABNORMAL HIGH (ref 70–99)
Potassium: 3.7 mmol/L (ref 3.5–5.1)
Sodium: 130 mmol/L — ABNORMAL LOW (ref 135–145)

## 2017-11-29 LAB — CBC
HCT: 40.2 % (ref 36.0–46.0)
Hemoglobin: 13.8 g/dL (ref 12.0–15.0)
MCH: 30.4 pg (ref 26.0–34.0)
MCHC: 34.3 g/dL (ref 30.0–36.0)
MCV: 88.5 fL (ref 78.0–100.0)
Platelets: 177 10*3/uL (ref 150–400)
RBC: 4.54 MIL/uL (ref 3.87–5.11)
RDW: 12.6 % (ref 11.5–15.5)
WBC: 6.6 10*3/uL (ref 4.0–10.5)

## 2017-11-29 MED ORDER — METRONIDAZOLE 500 MG PO TABS: 500.0000 mg | ORAL_TABLET | Freq: Two times a day (BID) | ORAL | 0 refills | Status: DC

## 2017-11-29 MED ORDER — METRONIDAZOLE 500 MG PO TABS: 500.0000 mg | ORAL_TABLET | Freq: Three times a day (TID) | ORAL | 0 refills | Status: DC

## 2017-11-29 MED ORDER — SODIUM CHLORIDE 0.9 % IV SOLN
INTRAVENOUS | Status: DC
  Administered 2017-11-29: 09:00:00 via INTRAVENOUS

## 2017-11-29 MED ORDER — CIPROFLOXACIN HCL 500 MG PO TABS: 500.0000 mg | ORAL_TABLET | Freq: Two times a day (BID) | ORAL | 0 refills | Status: DC

## 2017-11-29 NOTE — Discharge Summary (Signed)
Physician Discharge Summary  [name] ELF:[id number] DOB: 06/06/1940 DOA: 11/27/2017  PCP: [name], MD  Admit date: 11/27/2017 Discharge date: 11/29/2017  Admitted From: Home Disposition: Home   Recommendations for Outpatient Follow-up:  1. Follow up with PCP in 1-2 weeks 2. Follow up HR, BP. Held metoprolol due to bradycardia and h/o syncope.   Home Health: None Equipment/Devices: None Discharge Condition: Stable CODE STATUS: Full Diet recommendation: Heart healthy as tolerated  Brief/Interim Summary: [name] is a 77 y.o. female with a history of HTN, HLD, NAFLD, migraine, and trigeminal neuralgia who presented to the ED with abdominal cramping and diarrhea with an episode of lightheadedness followed by syncope. CT imaging demonstrated pancolitis with negative GI pathogen panel and CDiff PCR. Cipro/flagyl were administered and supportive treatment with IV fluids given while symptoms abated and she was discharge in stable condition to complete a course.   Discharge Diagnoses:  Principal Problem:   Colitis Active Problems:   Syncope, vasovagal   Abdominal pain   Diarrhea  Orthostatic syncope: In setting of dehydration, hyponatremia, diarrhea. Also possibly worsened by beta blockade-induced bradycardia. Had negative MRI brain and carotid dopplers recently - Resolved. No significant arrhythmias on telemetry.   Pancolitis: Negative GI pathogen panel, CDiff but still presumed infectious with affected contact (husband) and suspicious ingestion ([employer]).  - Continue 5 days cipro/flagyl - Continue po rehydration which she is tolerating well  Hyponatremia: Stable, mild, chronic.   Hypokalemia: Resolved with replacement, likely due to GI losses.   HTN:  - Continue medications as below, holding beta blocker pending follow up.  Discharge Instructions Discharge Instructions    Diet - low sodium heart healthy   Complete by:  As directed     Discharge instructions   Complete by:  As directed    You were admitted with pancolitis causing diarrhea and some GI bleeding. Because symptoms hav eimproved and you are able to take medications and sustenance by mouth you are stable for discharge with the following recommendations:  - Take flagyl three times per day and ciprofloxacin twice per day for the next 5 days.  - If your symptoms return, seek medical attention right away.  - Because your heart rate was slow with metoprolol, please stop this medication until you follow up with your doctor in the next 1-2 weeks.  - If you experience fainting, palpitations, chest pain, or shortness of breath, seek medical advice.   Increase activity slowly   Complete by:  As directed      Allergies as of 11/29/2017      Reactions   Hydrocodone Other (See Comments)   Rebound headaches   Oxycodone Other (See Comments)   Rebound headaches   Sulfamethoxazole-trimethoprim Other (See Comments)   REACTION: unspecified      Medication List    STOP taking these medications   metoprolol tartrate 50 MG tablet Commonly known as:  LOPRESSOR     TAKE these medications   albuterol 108 (90 Base) MCG/ACT inhaler Commonly known as:  PROVENTIL HFA;VENTOLIN HFA Inhale 2 puffs into the lungs every 6 (six) hours as needed.   amLODipine 10 MG tablet Commonly known as:  NORVASC TAKE 1 TABLET BY MOUTH ONCE DAILY   ASCOMP-CODEINE 50-325-40-30 MG capsule Generic drug:  butalbital-aspirin-caffeine-codeine TAKE 1-2 CAPSULES BY MOUTH EVERY FOUR TO SIX HOURS AS NEEDED FOR MIGRAINE MAX OF 6 CAPSULES IN 24 HOURS   benazepril 20 MG tablet Commonly known as:  LOTENSIN TAKE 1 TABLET BY MOUTH  ONCE DAILY   budesonide-formoterol 160-4.5 MCG/ACT inhaler Commonly known as:  SYMBICORT Inhale 2 puffs into the lungs 2 (two) times daily. What changed:    when to take this  reasons to take this   chlorthalidone 25 MG tablet Commonly known as:  HYGROTON TAKE 1 TABLET  BY MOUTH ONCE DAILY   ciprofloxacin 500 MG tablet Commonly known as:  CIPRO Take 1 tablet (500 mg total) by mouth 2 (two) times daily for 5 days.   conjugated estrogens vaginal cream Commonly known as:  PREMARIN Place 1 Applicatorful vaginally 2 (two) times a week.   EPITOL 200 MG tablet Generic drug:  carbamazepine TAKE 2 TABLETS BY MOUTH TWICE DAILY   fluticasone 50 MCG/ACT nasal spray Commonly known as:  FLONASE Place 2 sprays into both nostrils as needed for allergies or rhinitis.   metroNIDAZOLE 500 MG tablet Commonly known as:  FLAGYL Take 1 tablet (500 mg total) by mouth 2 (two) times daily.   ranitidine 300 MG tablet Commonly known as:  ZANTAC Take 1 tablet (300 mg total) by mouth at bedtime.   RESTASIS 0.05 % ophthalmic emulsion Generic drug:  cycloSPORINE Place 1 drop into both eyes 2 (two) times daily.       Allergies  Allergen Reactions  . Hydrocodone Other (See Comments)    Rebound headaches  . Oxycodone Other (See Comments)    Rebound headaches  . Sulfamethoxazole-Trimethoprim Other (See Comments)    REACTION: unspecified    Consultations:  None  Procedures/Studies: Ct Head Wo Contrast  Result Date: 11/28/2017 CLINICAL DATA:  Initial evaluation for acute syncope. EXAM: CT HEAD WITHOUT CONTRAST TECHNIQUE: Contiguous axial images were obtained from the base of the skull through the vertex without intravenous contrast. COMPARISON:  Comparison made with prior MRI from 10/18/2017 FINDINGS: Brain: Postoperative changes from prior right-sided trigeminal nerve decompression noted. Small amount of associated encephalomalacia within the right cerebellar hemisphere. No acute intracranial hemorrhage. No acute large vessel territory infarct. No mass lesion, midline shift or mass effect. No hydrocephalus. No extra-axial fluid collection. Vascular: No hyperdense vessel. Skull: Scalp soft tissues within normal limits. Prior right suboccipital craniotomy for trigeminal  nerve decompression. Sinuses/Orbits: Globes and orbital soft tissues within normal limits. Mild scattered mucosal thickening within the ethmoidal air cells and left sphenoid sinus. Paranasal sinuses are otherwise clear. Trace right mastoid effusion noted. Other: None. IMPRESSION: 1. No acute intracranial abnormality. 2. Postoperative changes from prior right trigeminal nerve decompression. Electronically Signed   By: Jeannine Boga M.D.   On: 11/28/2017 04:20   Ct Abdomen Pelvis W Contrast  Result Date: 11/27/2017 CLINICAL DATA:  77 y/o  F; syncopal events.  Abdominal pain. EXAM: CT ABDOMEN AND PELVIS WITH CONTRAST TECHNIQUE: Multidetector CT imaging of the abdomen and pelvis was performed using the standard protocol following bolus administration of intravenous contrast. CONTRAST:  21m OMNIPAQUE IOHEXOL 300 MG/ML  SOLN COMPARISON:  03/12/2015 CT abdomen and pelvis. FINDINGS: Lower chest: No acute abnormality. Hepatobiliary: No focal liver abnormality is seen. Status post cholecystectomy. No biliary dilatation. Pancreas: Unremarkable. No pancreatic ductal dilatation or surrounding inflammatory changes. Spleen: Normal in size without focal abnormality. Adrenals/Urinary Tract: Adrenal glands are unremarkable. Multiple renal cysts measuring up to 2.8 cm in the right interpolar kidney. Otherwise, kidneys are normal, without renal calculi, focal lesion, or hydronephrosis. Bladder is unremarkable. Stomach/Bowel: Acute pancolitis with mucosal enhancement and wall thickening. Moderate sigmoid diverticulosis. Appendix not identified, no secondary signs of acute appendicitis. No small bowel inflammation or obstructive changes. Diffuse  fecalization of small bowel contents suggests dysmotility. Stomach is unremarkable. Vascular/Lymphatic: Aortic atherosclerosis. No enlarged abdominal or pelvic lymph nodes. Reproductive: Status post hysterectomy. No adnexal masses. Other: No abdominal wall hernia or abnormality. Trace  volume of ascites. Musculoskeletal: No fracture is seen. Mild spondylosis of the lumbar spine. IMPRESSION: 1. Acute pancolitis.  No findings of perforation or abscess. 2. Sigmoid diverticulosis without findings of acute diverticulitis. Electronically Signed   By: Kristine Garbe M.D.   On: 11/27/2017 22:09     Subjective: Wants to go home. No diarrhea in past 24 hrs. No bleeding noted. Abdominal cramping resolved. Eating solids at breakfast. No fevers, leukocytosis.   Discharge Exam: Vitals:   11/29/17 0758 11/29/17 0813  BP:  134/63  Pulse: 67   Resp: 16   Temp:    SpO2: 97%    General: Pt is alert, awake, not in acute distress Cardiovascular: RRR, S1/S2 +, no rubs, no gallops Respiratory: CTA bilaterally, no wheezing, no rhonchi Abdominal: Soft, NT, ND, bowel sounds + Extremities: No edema, no cyanosis  Labs: BNP (last 3 results) No results for input(s): BNP in the last 8760 hours. Basic Metabolic Panel: Recent Labs  Lab 11/27/17 1801 11/28/17 0623 11/29/17 0440  NA 130* 130* 130*  K 3.7 3.3* 3.7  CL 92* 93* 92*  CO2 29 26 27   GLUCOSE 140* 102* 104*  BUN 22 12 7*  CREATININE 1.18* 0.95 0.92  CALCIUM 9.1 9.0 8.7*   Liver Function Tests: Recent Labs  Lab 11/27/17 1801 11/28/17 0623  AST 46* 31  ALT 33 28  ALKPHOS 99 82  BILITOT 1.0 1.2  PROT 6.1* 5.6*  ALBUMIN 4.0 3.4*   No results for input(s): LIPASE, AMYLASE in the last 168 hours. No results for input(s): AMMONIA in the last 168 hours. CBC: Recent Labs  Lab 11/27/17 1537 11/28/17 0623 11/29/17 0440  WBC 7.7 8.5 6.6  NEUTROABS 6.0  --   --   HGB 15.9* 16.3* 13.8  HCT 47.1* 47.9* 40.2  MCV 90.4 87.6 88.5  PLT 228 201 177   Cardiac Enzymes: Recent Labs  Lab 11/28/17 0035 11/28/17 0623 11/28/17 1122  TROPONINI <0.03 <0.03 <0.03   BNP: Invalid input(s): POCBNP CBG: Recent Labs  Lab 11/27/17 1554  GLUCAP 109*   D-Dimer No results for input(s): DDIMER in the last 72 hours. Hgb  A1c No results for input(s): HGBA1C in the last 72 hours. Lipid Profile No results for input(s): CHOL, HDL, LDLCALC, TRIG, CHOLHDL, LDLDIRECT in the last 72 hours. Thyroid function studies No results for input(s): TSH, T4TOTAL, T3FREE, THYROIDAB in the last 72 hours.  Invalid input(s): FREET3 Anemia work up No results for input(s): VITAMINB12, FOLATE, FERRITIN, TIBC, IRON, RETICCTPCT in the last 72 hours. Urinalysis    Component Value Date/Time   COLORURINE YELLOW 11/27/2017 1801   APPEARANCEUR HAZY (A) 11/27/2017 1801   LABSPEC 1.012 11/27/2017 1801   PHURINE 6.0 11/27/2017 1801   GLUCOSEU NEGATIVE 11/27/2017 1801   GLUCOSEU NEGATIVE 10/12/2017 1027   HGBUR NEGATIVE 11/27/2017 1801   HGBUR negative 02/25/2008 0857   BILIRUBINUR NEGATIVE 11/27/2017 1801   BILIRUBINUR neg 01/30/2017 1659   KETONESUR NEGATIVE 11/27/2017 1801   PROTEINUR NEGATIVE 11/27/2017 1801   UROBILINOGEN 0.2 10/12/2017 1027   NITRITE NEGATIVE 11/27/2017 1801   LEUKOCYTESUR NEGATIVE 11/27/2017 1801    Microbiology Recent Results (from the past 240 hour(s))  C difficile quick scan w PCR reflex     Status: None   Collection Time: 11/28/17  5:47  AM  Result Value Ref Range Status   C Diff antigen NEGATIVE NEGATIVE Final   C Diff toxin NEGATIVE NEGATIVE Final   C Diff interpretation No C. difficile detected.  Final    Comment: Performed at [facility], [place] [address], [place], [place] [address]  Gastrointestinal Panel by PCR , Stool     Status: None   Collection Time: 11/28/17  5:47 AM  Result Value Ref Range Status   Campylobacter species NOT DETECTED NOT DETECTED Final   Plesimonas shigelloides NOT DETECTED NOT DETECTED Final   Salmonella species NOT DETECTED NOT DETECTED Final   Yersinia enterocolitica NOT DETECTED NOT DETECTED Final   Vibrio species NOT DETECTED NOT DETECTED Final   Vibrio cholerae NOT DETECTED NOT DETECTED Final   Enteroaggregative E coli (EAEC) NOT DETECTED NOT DETECTED Final    Enteropathogenic E coli (EPEC) NOT DETECTED NOT DETECTED Final   Enterotoxigenic E coli (ETEC) NOT DETECTED NOT DETECTED Final   Shiga like toxin producing E coli (STEC) NOT DETECTED NOT DETECTED Final   Shigella/Enteroinvasive E coli (EIEC) NOT DETECTED NOT DETECTED Final   Cryptosporidium NOT DETECTED NOT DETECTED Final   Cyclospora cayetanensis NOT DETECTED NOT DETECTED Final   Entamoeba histolytica NOT DETECTED NOT DETECTED Final   Giardia lamblia NOT DETECTED NOT DETECTED Final   Adenovirus F40/41 NOT DETECTED NOT DETECTED Final   Astrovirus NOT DETECTED NOT DETECTED Final   Norovirus GI/GII NOT DETECTED NOT DETECTED Final   Rotavirus A NOT DETECTED NOT DETECTED Final   Sapovirus (I, II, IV, and V) NOT DETECTED NOT DETECTED Final    Comment: Performed at [facility], [address] [place], [place] [address]    Time coordinating discharge: Approximately 40 minutes  Patrecia Pour, MD  [facility] 11/29/2017, 9:56 AM Pager [phone]

## 2017-11-29 NOTE — Care Management Obs Status (Signed)
[place] NOTIFICATION   Patient Details  Name: [name] MRN: [id number] Date of Birth: [birth year]   Medicare Observation Status Notification Given:  Yes    Sharin Mons, RN 11/29/2017, 12:25 PM

## 2017-11-29 NOTE — Discharge Planning (Signed)
  [name] discharged Home per MD order.  Discharge instructions reviewed and discussed with the patient, all questions and concerns answered. Copy of instructions and care notes for new medication and diagnosis given to patient, prescriptions sent to pt.   Allergies as of 11/29/2017      Reactions   Hydrocodone Other (See Comments)   Rebound headaches   Oxycodone Other (See Comments)   Rebound headaches   Sulfamethoxazole-trimethoprim Other (See Comments)   REACTION: unspecified      Medication List    STOP taking these medications   metoprolol tartrate 50 MG tablet Commonly known as:  LOPRESSOR     TAKE these medications   albuterol 108 (90 Base) MCG/ACT inhaler Commonly known as:  PROVENTIL HFA;VENTOLIN HFA Inhale 2 puffs into the lungs every 6 (six) hours as needed.   amLODipine 10 MG tablet Commonly known as:  NORVASC TAKE 1 TABLET BY MOUTH ONCE DAILY   ASCOMP-CODEINE 50-325-40-30 MG capsule Generic drug:  butalbital-aspirin-caffeine-codeine TAKE 1-2 CAPSULES BY MOUTH EVERY FOUR TO SIX HOURS AS NEEDED FOR MIGRAINE MAX OF 6 CAPSULES IN 24 HOURS   benazepril 20 MG tablet Commonly known as:  LOTENSIN TAKE 1 TABLET BY MOUTH ONCE DAILY   budesonide-formoterol 160-4.5 MCG/ACT inhaler Commonly known as:  SYMBICORT Inhale 2 puffs into the lungs 2 (two) times daily. What changed:    when to take this  reasons to take this   chlorthalidone 25 MG tablet Commonly known as:  HYGROTON TAKE 1 TABLET BY MOUTH ONCE DAILY   ciprofloxacin 500 MG tablet Commonly known as:  CIPRO Take 1 tablet (500 mg total) by mouth 2 (two) times daily for 5 days.   conjugated estrogens vaginal cream Commonly known as:  PREMARIN Place 1 Applicatorful vaginally 2 (two) times a week.   EPITOL 200 MG tablet Generic drug:  carbamazepine TAKE 2 TABLETS BY MOUTH TWICE DAILY   fluticasone 50 MCG/ACT nasal spray Commonly known as:  FLONASE Place 2 sprays into both nostrils as  needed for allergies or rhinitis.   metroNIDAZOLE 500 MG tablet Commonly known as:  FLAGYL Take 1 tablet (500 mg total) by mouth 3 (three) times daily.   ranitidine 300 MG tablet Commonly known as:  ZANTAC Take 1 tablet (300 mg total) by mouth at bedtime.   RESTASIS 0.05 % ophthalmic emulsion Generic drug:  cycloSPORINE Place 1 drop into both eyes 2 (two) times daily.       IV site discontinued and catheter remains intact. Site without signs and symptoms of complications. Dressing and pressure applied.  Patient escorted to car by volunteer services in a wheelchair,  no distress noted upon discharge.  Thecla Forgione C 11/29/2017 1:41 PM

## 2017-11-30 ENCOUNTER — Telehealth: Payer: Self-pay | Admitting: *Deleted

## 2017-11-30 NOTE — Telephone Encounter (Signed)
Transition Care Management Follow-up Telephone Call  Per Discharge Summary: Admit date: 11/27/2017 Discharge date: 11/29/2017  Admitted From: Home Disposition: Home   Recommendations for Outpatient Follow-up:  1. Follow up with PCP in 1-2 weeks 2. Follow up HR, BP. Held metoprolol due to bradycardia and h/o syncope.   Home Health: None Equipment/Devices: None Discharge Condition: Stable CODE STATUS: Full Diet recommendation: Heart healthy as tolerated  --   How have you been since you were released from the hospital? "I got home yesterday and slept a lot. I'm really tired and I don't feel really good but I'm recovering."   Do you understand why you were in the hospital? yes   Do you understand the discharge instructions? yes   Where were you discharged to? Home   Items Reviewed:  Medications reviewed: yes  Allergies reviewed: yes  Dietary changes reviewed: yes  Referrals reviewed: yes   Functional Questionnaire:   Activities of Daily Living (ADLs):   She states they are independent in the following: ambulation, bathing and hygiene, feeding, continence, grooming, toileting and dressing States they require assistance with the following: driving   Any transportation issues/concerns?: no  Any patient concerns? yes, her Acquanetta Chain was discontinued and she would like to discuss an alternative   Confirmed importance and date/time of follow-up visits scheduled yes  Provider Appointment booked with Dr. [name] 12/04/17 @ 10:00am  Confirmed with patient if condition begins to worsen call PCP or go to the ER.  Patient was given the office number and encouraged to call back with question or concerns.  : yes

## 2017-12-01 NOTE — Progress Notes (Signed)
Chief Complaint  Patient presents with  . Hospitalization Follow-up    HPI: [name] 77 y.o. come in for fu of hosp for syncope associated with pancolitis.  She just finished the antibiotic yesterday Cipro and metronidazole which caused GI problems she is now eating regularly and getting better from her hospital discharge week ago.  She still feels tired and not back to baseline She had her syncope which she calls  finfhsed antibiotic yetserday ,.   And now now regulary eating.   Had syncope " Face plant in [employer]" where she .  Stopped to used the bathroom because she began having urgent abdominal cramps and then felt light headed  And buzzing.  And clammy without associated chest pain shortness of breath or neurologic symptoms awake with  Paramedics.  Had abd cramps    Began first     Nl bowels now some are more loose and no more cramping   At hospital   Had diarrhea and at ed  after 6 hours   Had  br to then [facility]  And then  [facility] away   She was told to stop the beta-blocker because her pulse rate was too low I believe in the 50s.  And her blood pressure was controlled. No nfeve r and labs  Neg  X potassium as low   Ct showed pan colitis and stool screens were negative fro pathogen panel and c diff .  ROS: See pertinent positives and negatives per HPI. No cp sob palpations bleeding   Past Medical History:  Diagnosis Date  . ADJ DISORDER WITH MIXED ANXIETY & DEPRESSED MOOD 03/03/2010   Qualifier: Diagnosis of  By: [name], [name]   . Closed head injury 02/01/2011   from syncope and had scalp laceration  neg ct .    Marland Kitchen Closed head injury 5-6 yrs ago  . Complication of anesthesia    migraine several hours after general anesthesia  . Fatty liver   . Gall stones 2016   see ct scan neg HIDA   . GERD (gastroesophageal reflux disease)   . Hearing aid worn   . HOH (hard of hearing)    both ears  . Hyperlipidemia   . Hypertension    echo nl lv function  mild  dilitation 2009  . Kidney infection    few yrs ago in hospital  . Medication side effect 09/02/2010   Poss muscle se of 10 crestor   . Migraine    hypnic HA eval by Dr. Earley Favor in the past  . Polycythemia   . 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 Syncope 02/01/2011   In shower on vacation  sustained head laceration  8 sutures Had ed visit neg head ct labs and x ray   . Trigeminal neuralgia pain     Family History  Problem Relation Age of Onset  . Ovarian cancer Mother   . Stroke Mother   . Alcohol abuse Father   . Stroke Father   . Diabetes Brother   . Cancer Paternal Aunt        leukemia, unknown type  . Seizures Daughter   . Hypertension Unknown   . Colon cancer Neg Hx     Social History   Socioeconomic History  . Marital status: Married    Spouse name: Not on file  . Number of children: 2  . Years  of education: Not on file  . Highest education level: Not on file  Occupational History    Comment: retired Forensic psychologist  Social Needs  . Financial resource strain: Not on file  . Food insecurity:    Worry: Not on file    Inability: Not on file  . Transportation needs:    Medical: Not on file    Non-medical: Not on file  Tobacco Use  . Smoking status: Never Smoker  . Smokeless tobacco: Never Used  Substance and Sexual Activity  . Alcohol use: Yes    Alcohol/week: 1.2 oz    Types: 2 Glasses of wine per week    Comment: occ wine  . Drug use: No  . Sexual activity: Not on file  Lifestyle  . Physical activity:    Days per week: Not on file    Minutes per session: Not on file  . Stress: Not on file  Relationships  . Social connections:    Talks on phone: Not on file    Gets together: Not on file    Attends religious service: Not on file    Active member of club or organization: Not on file    Attends meetings of clubs or organizations: Not on file    Relationship status: Not on file  Other  Topics Concern  . Not on file  Social History Narrative   Married   HH of 2-3 (god daughter)   Pets 2 dogs   Non smoker    Child is a physician   G2P2             Outpatient Medications Prior to Visit  Medication Sig Dispense Refill  . albuterol (PROVENTIL HFA;VENTOLIN HFA) 108 (90 Base) MCG/ACT inhaler Inhale 2 puffs into the lungs every 6 (six) hours as needed. 1 Inhaler 2  . amLODipine (NORVASC) 10 MG tablet TAKE 1 TABLET BY MOUTH ONCE DAILY 90 tablet 1  . ASCOMP-CODEINE 50-325-40-30 MG capsule TAKE 1-2 CAPSULES BY MOUTH EVERY FOUR TO SIX HOURS AS NEEDED FOR MIGRAINE MAX OF 6 CAPSULES IN 24 HOURS 30 capsule 0  . benazepril (LOTENSIN) 20 MG tablet TAKE 1 TABLET BY MOUTH ONCE DAILY 90 tablet 1  . budesonide-formoterol (SYMBICORT) 160-4.5 MCG/ACT inhaler Inhale 2 puffs into the lungs 2 (two) times daily. (Patient taking differently: Inhale 2 puffs into the lungs 2 (two) times daily as needed. ) 1 Inhaler 2  . chlorthalidone (HYGROTON) 25 MG tablet TAKE 1 TABLET BY MOUTH ONCE DAILY 90 tablet 1  . conjugated estrogens (PREMARIN) vaginal cream Place 1 Applicatorful vaginally 2 (two) times a week.    . EPITOL 200 MG tablet TAKE 2 TABLETS BY MOUTH TWICE DAILY 360 tablet 0  . fluticasone (FLONASE) 50 MCG/ACT nasal spray Place 2 sprays into both nostrils as needed for allergies or rhinitis.     . ranitidine (ZANTAC) 300 MG tablet Take 1 tablet (300 mg total) by mouth at bedtime. 90 tablet 3  . RESTASIS 0.05 % ophthalmic emulsion Place 1 drop into both eyes 2 (two) times daily.   1  . ciprofloxacin (CIPRO) 500 MG tablet Take 1 tablet (500 mg total) by mouth 2 (two) times daily for 5 days. (Patient not taking: Reported on 12/04/2017) 10 tablet 0  . metroNIDAZOLE (FLAGYL) 500 MG tablet Take 1 tablet (500 mg total) by mouth 3 (three) times daily. (Patient not taking: Reported on 12/04/2017) 15 tablet 0   No facility-administered medications prior to visit.  EXAM:  BP 110/70   Pulse 70    Temp 97.7 F (36.5 C)   Wt 141 lb (64 kg)   SpO2 96%   BMI 22.76 kg/m   Body mass index is 22.76 kg/m.  GENERAL: vitals reviewed and listed above, alert, oriented, appears well hydrated and in no acute distress nonicteric no acute rash HEENT: atraumatic, conjunctiva  clear, no obvious abnormalities on inspection of external nose and ears  NECK: no obvious masses on inspection palpation  LUNGS: clear to auscultation bilaterally, no wheezes, rales or rhonchi, good air movement Abdomen:  Sof,t normal bowel sounds  Min tenderness without hepatosplenomegaly, no guarding rebound or masses no CVA tenderness CV: HRRR, no clubbing cyanosis slightly puffy feet  peripheral edema nl cap refill  ? Slight bruit right femoral no abd bruits  MS: moves all extremities without noticeable focal  abnormality PSYCH: pleasant and cooperative, no obvious depression or anxiety Lab Results  Component Value Date   WBC 6.6 11/29/2017   HGB 13.8 11/29/2017   HCT 40.2 11/29/2017   PLT 177 11/29/2017   GLUCOSE 104 (H) 11/29/2017   CHOL 233 (H) 05/30/2017   TRIG 61.0 05/30/2017   HDL 92.00 05/30/2017   LDLDIRECT 135.9 10/16/2012   LDLCALC 129 (H) 05/30/2017   ALT 28 11/28/2017   AST 31 11/28/2017   NA 130 (L) 11/29/2017   K 3.7 11/29/2017   CL 92 (L) 11/29/2017   CREATININE 0.92 11/29/2017   BUN 7 (L) 11/29/2017   CO2 27 11/29/2017   TSH 1.37 05/30/2017   INR 1.23 03/12/2015   BP Readings from Last 3 Encounters:  12/04/17 110/70  11/29/17 134/63  11/22/17 113/73   Wt Readings from Last 3 Encounters:  12/04/17 141 lb (64 kg)  11/29/17 137 lb 5.6 oz (62.3 kg)  11/22/17 139 lb (63 kg)     ASSESSMENT AND PLAN:  Discussed the following assessment and plan:  Colitis - improved  empric rx antibiotic  - Plan: POCT Urinalysis Dipstick (Automated)  Abdominal tenderness without rebound tenderness, unspecified location - Plan: POCT Urinalysis Dipstick (Automated)  History of recurrent UTI (urinary  tract infection) - Plan: POCT Urinalysis Dipstick (Automated)  Medication management - Plan: Basic metabolic panel  Essential hypertension - Plan: Basic metabolic panel  Hospital discharge follow-up  History of syncope meds were helps  consiering   b  blocker iseeus  Reviewed the hospital records and scenario at this time her syncope seems to be related to her abdominal process diagnosed as colitis presumed infectious. No obvious ischemic colitis although she did have blood.  She is a lot better we will keep her off her beta-blocker since her blood pressures controlled pulse is closer to 70 today. She has some concerned about UTI because of her previous ones that put her in the hospital we will check a urinalysis today. Follow-up chemistry in a few weeks to make sure potassium is stabilized.  As well as sodium. Doing a GI referral to add to the team previous GI  Is not available  -Patient advised to return or notify health care team  if  new concerns arise.  Patient Instructions  Agree with to stay  off of the metoprolol.     Exam is good today . Checking urine but cipro is pretty good for uti .   I will do a gi referral for fu  About the colitis of unknown but presumed  Infectious cause esp if having any ongoing symptoms .  Get back with [place]   If any recurrance in interil .  Plan chemistry check in 2-3 weeks   Ok to not fast and at any time .        [name]. [name] M.D.

## 2017-12-04 ENCOUNTER — Encounter: Payer: Self-pay | Admitting: Internal Medicine

## 2017-12-04 ENCOUNTER — Ambulatory Visit: Payer: Medicare Other | Admitting: Internal Medicine

## 2017-12-04 VITALS — BP 110/70 | HR 70 | Temp 97.7°F | Wt 141.0 lb

## 2017-12-04 DIAGNOSIS — R10819 Abdominal tenderness, unspecified site: Secondary | ICD-10-CM

## 2017-12-04 DIAGNOSIS — Z87898 Personal history of other specified conditions: Secondary | ICD-10-CM | POA: Diagnosis not present

## 2017-12-04 DIAGNOSIS — Z79899 Other long term (current) drug therapy: Secondary | ICD-10-CM

## 2017-12-04 DIAGNOSIS — Z87440 Personal history of urinary (tract) infections: Secondary | ICD-10-CM | POA: Diagnosis not present

## 2017-12-04 DIAGNOSIS — I10 Essential (primary) hypertension: Secondary | ICD-10-CM | POA: Diagnosis not present

## 2017-12-04 DIAGNOSIS — Z09 Encounter for follow-up examination after completed treatment for conditions other than malignant neoplasm: Secondary | ICD-10-CM

## 2017-12-04 DIAGNOSIS — K529 Noninfective gastroenteritis and colitis, unspecified: Secondary | ICD-10-CM | POA: Diagnosis not present

## 2017-12-04 LAB — POC URINALSYSI DIPSTICK (AUTOMATED)
Bilirubin, UA: NEGATIVE
Blood, UA: NEGATIVE
Glucose, UA: NEGATIVE
Ketones, UA: NEGATIVE
Nitrite, UA: NEGATIVE
Protein, UA: NEGATIVE
Spec Grav, UA: 1.02 (ref 1.010–1.025)
Urobilinogen, UA: 0.2 E.U./dL
pH, UA: 6 (ref 5.0–8.0)

## 2017-12-04 NOTE — Patient Instructions (Addendum)
Agree with to stay  off of the metoprolol.     Exam is good today . Checking urine but cipro is pretty good for uti .   I will do a gi referral for fu  About the colitis of unknown but presumed  Infectious cause esp if having any ongoing symptoms .   Get back with [place]   If any recurrance in interil .  Plan chemistry check in 2-3 weeks   Ok to not fast and at any time .

## 2017-12-26 ENCOUNTER — Other Ambulatory Visit (INDEPENDENT_AMBULATORY_CARE_PROVIDER_SITE_OTHER): Payer: Medicare Other

## 2017-12-26 DIAGNOSIS — I10 Essential (primary) hypertension: Secondary | ICD-10-CM

## 2017-12-26 DIAGNOSIS — Z79899 Other long term (current) drug therapy: Secondary | ICD-10-CM | POA: Diagnosis not present

## 2017-12-26 LAB — BASIC METABOLIC PANEL
BUN: 17 mg/dL (ref 6–23)
CO2: 30 mEq/L (ref 19–32)
Calcium: 9.5 mg/dL (ref 8.4–10.5)
Chloride: 96 mEq/L (ref 96–112)
Creatinine, Ser: 0.91 mg/dL (ref 0.40–1.20)
GFR: 63.47 mL/min (ref >60.00)
Glucose, Bld: 95 mg/dL (ref 70–99)
Potassium: 4.9 mEq/L (ref 3.5–5.1)
Sodium: 133 mEq/L — ABNORMAL LOW (ref 135–145)

## 2017-12-27 ENCOUNTER — Encounter: Payer: Self-pay | Admitting: *Deleted

## 2018-01-02 ENCOUNTER — Emergency Department (HOSPITAL_COMMUNITY)
Admission: EM | Admit: 2018-01-02 | Discharge: 2018-01-02 | Disposition: A | Discharge status: Home / Self Care | Payer: Medicare Other | Attending: Emergency Medicine | Admitting: Emergency Medicine

## 2018-01-02 ENCOUNTER — Encounter (HOSPITAL_COMMUNITY): Payer: Self-pay

## 2018-01-02 ENCOUNTER — Emergency Department (HOSPITAL_COMMUNITY): Payer: Medicare Other

## 2018-01-02 ENCOUNTER — Other Ambulatory Visit: Payer: Self-pay

## 2018-01-02 DIAGNOSIS — R0781 Pleurodynia: Secondary | ICD-10-CM | POA: Diagnosis not present

## 2018-01-02 DIAGNOSIS — W010XXA Fall on same level from slipping, tripping and stumbling without subsequent striking against object, initial encounter: Secondary | ICD-10-CM | POA: Diagnosis not present

## 2018-01-02 DIAGNOSIS — S4991XA Unspecified injury of right shoulder and upper arm, initial encounter: Secondary | ICD-10-CM | POA: Diagnosis present

## 2018-01-02 DIAGNOSIS — Y939 Activity, unspecified: Secondary | ICD-10-CM | POA: Insufficient documentation

## 2018-01-02 DIAGNOSIS — Y92009 Unspecified place in unspecified non-institutional (private) residence as the place of occurrence of the external cause: Secondary | ICD-10-CM | POA: Insufficient documentation

## 2018-01-02 DIAGNOSIS — W19XXXA Unspecified fall, initial encounter: Secondary | ICD-10-CM

## 2018-01-02 DIAGNOSIS — S42031A Displaced fracture of lateral end of right clavicle, initial encounter for closed fracture: Secondary | ICD-10-CM | POA: Insufficient documentation

## 2018-01-02 DIAGNOSIS — I10 Essential (primary) hypertension: Secondary | ICD-10-CM | POA: Diagnosis not present

## 2018-01-02 DIAGNOSIS — S299XXA Unspecified injury of thorax, initial encounter: Secondary | ICD-10-CM | POA: Diagnosis not present

## 2018-01-02 DIAGNOSIS — R51 Headache: Secondary | ICD-10-CM | POA: Diagnosis not present

## 2018-01-02 DIAGNOSIS — S0990XA Unspecified injury of head, initial encounter: Secondary | ICD-10-CM | POA: Diagnosis not present

## 2018-01-02 DIAGNOSIS — Y999 Unspecified external cause status: Secondary | ICD-10-CM | POA: Insufficient documentation

## 2018-01-02 DIAGNOSIS — Z79899 Other long term (current) drug therapy: Secondary | ICD-10-CM | POA: Diagnosis not present

## 2018-01-02 MED ORDER — DOCUSATE SODIUM 250 MG PO CAPS: 250.0000 mg | ORAL_CAPSULE | Freq: Every day | ORAL | 0 refills | Status: DC

## 2018-01-02 MED ORDER — OXYCODONE-ACETAMINOPHEN 5-325 MG PO TABS: 1.0000 | ORAL_TABLET | Freq: Four times a day (QID) | ORAL | 0 refills | Status: DC | PRN

## 2018-01-02 MED ORDER — ONDANSETRON 4 MG PO TBDP: 4.0000 mg | ORAL_TABLET | Freq: Three times a day (TID) | ORAL | 0 refills | Status: DC | PRN

## 2018-01-02 MED ORDER — OXYCODONE-ACETAMINOPHEN 5-325 MG PO TABS
1.0000 | ORAL_TABLET | Freq: Once | ORAL | Status: AC
  Administered 2018-01-02: 1 via ORAL
  Filled 2018-01-02: qty 1

## 2018-01-02 NOTE — Discharge Instructions (Signed)
Wear your sling as often as you can, to help with pain and healing of your fracture.  As we discussed, call the Orthopedist, Dr. Lorin Mercy, for follow-up in the next few days.  For your pain, you can take the percocet as needed. I'd recommend taking a stool softener, colace, with this to help prevent constipation. I've also prescribed Zofran as this medication can cause nausea. Take it with food.  It was a pleasure taking care of you today.

## 2018-01-02 NOTE — ED Triage Notes (Addendum)
Pt experienced a mechanical fall, tripping over foot and landing on floor. Pt states she hit forehead, no LOC and c/o right shoulder pain. Hx of right rotator cuff surgery. No blood thinners.

## 2018-01-02 NOTE — ED Provider Notes (Signed)
[place] DEPT Provider Note   CSN: [id number] Arrival date & time: 01/02/18  1245     History   Chief Complaint Chief Complaint  Patient presents with  . Fall  . Shoulder Pain    HPI [name] is a 77 y.o. female.  HPI Very pleasant 77 year old female here with right shoulder pain after fall.  The patient currently is having work done in her house due to roofing issues.  She has multiple areas of flooding and has cords across her house connected to machines for trying her anterior.  She states that she tripped on one and fell directly onto her right shoulder.  She believes she hit her head.  She does not think that she lost consciousness.  She is down for approximately 30 minutes, with difficulty getting up due to severe pain in her shoulder.  Ultimately, she was able to be helped up.  She is been Deal[employer] since then.  She denies any headache or neck pain.  She has aching, gnawing, sharp, right shoulder pain is worse with any movement or palpation.  Denies any alleviating factors.  No nausea or vomiting.  No other complaints.  No abdominal pain.  No hip pain.   Past Medical History:  Diagnosis Date  . ADJ DISORDER WITH MIXED ANXIETY & DEPRESSED MOOD 03/03/2010   Qualifier: Diagnosis of  By: [name], [name]   . Closed head injury 02/01/2011   from syncope and had scalp laceration  neg ct .    Marland Kitchen Closed head injury 5-6 yrs ago  . Complication of anesthesia    migraine several hours after general anesthesia  . Fatty liver   . Gall stones 2016   see ct scan neg HIDA   . GERD (gastroesophageal reflux disease)   . Hearing aid worn   . HOH (hard of hearing)    both ears  . Hyperlipidemia   . Hypertension    echo nl lv function  mild dilitation 2009  . Kidney infection    few yrs ago in hospital  . Medication side effect 09/02/2010   Poss muscle se of 10 crestor   . Migraine    hypnic HA eval by Dr. Earley Favor in the past  .  Polycythemia   . 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 Syncope 02/01/2011   In shower on vacation  sustained head laceration  8 sutures Had ed visit neg head ct labs and x ray   . Trigeminal neuralgia pain     Patient Active Problem List   Diagnosis Date Noted  . Colitis 11/27/2017  . Diarrhea 11/27/2017  . Closed head injury 11/27/2015  . Unilateral headache 05/26/2015  . Pyelonephritis 03/12/2015  . ARF (acute renal failure) ([place]) 03/12/2015  . Gallstones 03/12/2015  . Nausea without vomiting 12/10/2014  . Essential hypertension 05/29/2014  . 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 Abdominal pain 05/29/2013  . Agent resistant to multiple antibiotics 05/29/2013  . Low back pain radiating to right leg 01/16/2013  . Sinus problem 11/20/2012  . Chronic headaches morning 10/19/2012  . Low sodium levels  133 10/19/2012  . Leg pain, posterior 08/16/2012  . Sciatic neuritis 08/16/2012  . Sleep disturbance, unspecified 08/16/2012  . Medication management 03/24/2012  . Preventative health care 09/24/2011  . Trigeminal neuralgia 09/24/2011  . Postmenopausal HRT (hormone replacement therapy) 09/24/2011  . Polycythemia 09/20/2011  .  Syncope, vasovagal 02/01/2011  . Morning headache 02/01/2011  . Hearing aid worn   . Abnormal blood finding   . LOCALIZED SUPERFICIAL SWELLING MASS OR LUMP 09/21/2009  . DYSPNEA 05/07/2008  . Hypnic headache 03/19/2008  . 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 Headache(784.0) 06/05/2007  . JAW PAIN 04/06/2007  . OSTEOARTHRITIS 04/06/2007  . ABNORMAL RESULT, FUNCTION STUDY, [place] 02/05/2007  . HYPERLIPIDEMIA 12/25/2006  . HYPERTENSION 12/25/2006    Past Surgical History:  Procedure Laterality Date  . ABDOMINAL HYSTERECTOMY  2002   tubal  . CARDIAC CATHETERIZATION  2000   chest pains neg  . CHOLECYSTECTOMY N/A 02/21/2017   Procedure: LAPAROSCOPIC CHOLECYSTECTOMY  WITH INTRAOPERATIVE CHOLANGIOGRAM;  Surgeon: Armandina Gemma, MD;  Location: [facility];  Service: General;  Laterality: N/A;  . COLONOSCOPY     multiple  . CRANIOTOMY  12/09/2011   nerve decompression right trigeminal   . DOPPLER ECHOCARDIOGRAPHY  2009   nl lv function mild lv dilitation  . EYE SURGERY Bilateral    ioc for catatracts  . laparoscopic gallbladder surgery  02/16/2017   Fax from [facility]  . OOPHORECTOMY Bilateral 2002  . rt shoulder surgery       OB History    Gravida  2   Para  2   Term      Preterm      AB      Living        SAB      TAB      Ectopic      Multiple      Live Births               Home Medications    Prior to Admission medications   Medication Sig Start Date End Date Taking? Authorizing Provider  albuterol (PROVENTIL HFA;VENTOLIN HFA) 108 (90 Base) MCG/ACT inhaler Inhale 2 puffs into the lungs every 6 (six) hours as needed. 04/26/16   [name], [name], MD  amLODipine (NORVASC) 10 MG tablet TAKE 1 TABLET BY MOUTH ONCE DAILY 08/21/17   [name], [name], MD  ASCOMP-CODEINE [id number] MG capsule TAKE 1-2 CAPSULES BY MOUTH EVERY FOUR TO SIX HOURS AS NEEDED FOR MIGRAINE MAX OF 6 CAPSULES IN 24 HOURS 03/04/16   [name], [name], MD  benazepril (LOTENSIN) 20 MG tablet TAKE 1 TABLET BY MOUTH ONCE DAILY 11/07/17   [name], [name], MD  budesonide-formoterol (SYMBICORT) 160-4.5 MCG/ACT inhaler Inhale 2 puffs into the lungs 2 (two) times daily. Patient taking differently: Inhale 2 puffs into the lungs 2 (two) times daily as needed.  07/16/15   [name], [name], MD  chlorthalidone (HYGROTON) 25 MG tablet TAKE 1 TABLET BY MOUTH ONCE DAILY 08/09/17   [name], [name], MD  conjugated estrogens (PREMARIN) vaginal cream Place 1 Applicatorful vaginally 2 (two) times a week.    [provider]  docusate sodium (COLACE) 250 MG capsule Take 1 capsule (250 mg total) by mouth daily. While taking pain medications 01/02/18   Duffy Bruce, MD    EPITOL 200 MG tablet TAKE 2 TABLETS BY MOUTH TWICE DAILY 06/02/17   [name], [name], MD  fluticasone (FLONASE) 50 MCG/ACT nasal spray Place 2 sprays into both nostrils as needed for allergies or rhinitis.     [provider]  ondansetron (ZOFRAN ODT) 4 MG disintegrating tablet Take 1 tablet (4 mg total) by mouth every 8 (eight) hours as needed for nausea or vomiting. 01/02/18   Duffy Bruce, MD  oxyCODONE-acetaminophen (PERCOCET/ROXICET) [phone]  MG tablet Take 1-2 tablets by mouth every 6 (six) hours as needed for moderate pain or severe pain. 01/02/18   Duffy Bruce, MD  ranitidine (ZANTAC) 300 MG tablet Take 1 tablet (300 mg total) by mouth at bedtime. 09/26/17   [name], MD  RESTASIS 0.05 % ophthalmic emulsion Place 1 drop into both eyes 2 (two) times daily.  05/17/16   [provider]    Family History Family History  Problem Relation Age of Onset  . Ovarian cancer Mother   . Stroke Mother   . Alcohol abuse Father   . Stroke Father   . Diabetes Brother   . Cancer Paternal Aunt        leukemia, unknown type  . Seizures Daughter   . Hypertension Unknown   . Colon cancer Neg Hx     Social History Social History   Tobacco Use  . Smoking status: Never Smoker  . Smokeless tobacco: Never Used  Substance Use Topics  . Alcohol use: Yes    Alcohol/week: 2.0 standard drinks    Types: 2 Glasses of wine per week    Comment: occ wine  . Drug use: No     Allergies   Hydrocodone; Oxycodone; and Sulfamethoxazole-trimethoprim   Review of Systems Review of Systems  Constitutional: Negative for chills, fatigue and fever.  HENT: Negative for congestion and rhinorrhea.   Eyes: Negative for visual disturbance.  Respiratory: Negative for cough, shortness of breath and wheezing.   Cardiovascular: Negative for chest pain and leg swelling.  Gastrointestinal: Negative for abdominal pain, diarrhea, nausea and vomiting.  Genitourinary: Negative for dysuria and flank  pain.  Musculoskeletal: Positive for arthralgias and joint swelling. Negative for neck pain and neck stiffness.  Skin: Negative for rash and wound.  Allergic/Immunologic: Negative for immunocompromised state.  Neurological: Negative for syncope, weakness and headaches.  All other systems reviewed and are negative.    Physical Exam Updated Vital Signs BP (!) 149/85 (BP Location: Right Arm)   Pulse 64   Temp 97.7 F (36.5 C) (Oral)   Resp 16   Ht 5' 6"  (1.676 m)   Wt 67.1 kg   SpO2 100%   BMI 23.89 kg/m   Physical Exam  Constitutional: She is oriented to person, place, and time. She appears well-developed and well-nourished. No distress.  HENT:  Head: Normocephalic and atraumatic.  Eyes: Conjunctivae are normal.  Neck: Neck supple.  Cardiovascular: Normal rate, regular rhythm and normal heart sounds. Exam reveals no friction rub.  No murmur heard. Pulmonary/Chest: Effort normal and breath sounds normal. No respiratory distress. She has no wheezes. She has no rales.  Abdominal: She exhibits no distension.  Musculoskeletal: She exhibits no edema.  Neurological: She is alert and oriented to person, place, and time. She has normal strength. No sensory deficit. She exhibits normal muscle tone. GCS eye subscore is 4. GCS verbal subscore is 5. GCS motor subscore is 6.  Skin: Skin is warm. Capillary refill takes less than 2 seconds.  Psychiatric: She has a normal mood and affect.  Nursing note and vitals reviewed.   UPPER EXTREMITY EXAM: RIGHT  INSPECTION & PALPATION: Significant TTP and bruising over R distal clavicle. Superficial abrasion overlying elbow. No deep wounds/lacs/punctures.  SENSORY: Sensation is intact to light touch in:  Superficial radial nerve distribution (dorsal first web space) Median nerve distribution (tip of index finger)   Ulnar nerve distribution (tip of small finger)     MOTOR:  + Motor posterior interosseous  nerve (thumb IP extension) + Anterior  interosseous nerve (thumb IP flexion, index finger DIP flexion) + Radial nerve (wrist extension) + Median nerve (palpable firing thenar mass) + Ulnar nerve (palpable firing of first dorsal interosseous muscle)  VASCULAR: 2+ radial pulse Brisk capillary refill < 2 sec, fingers warm and well-perfused   ED Treatments / Results  Labs (all labs ordered are listed, but only abnormal results are displayed) Labs Reviewed - No data to display  EKG None  Radiology Dg Ribs Unilateral W/chest Right  Result Date: 01/02/2018 CLINICAL DATA:  Pain following fall.  Known right clavicle fracture EXAM: RIGHT RIBS AND CHEST - 3+ VIEW COMPARISON:  Chest radiograph July 15, 2015; right clavicle radiographs January 02, 2018 FINDINGS: Frontal chest as well as oblique and cone-down rib images obtained. Comminuted fracture of the lateral right clavicle is again noted. The lungs are clear. Heart size and pulmonary vascularity are normal. No adenopathy evident. There is no appreciable pneumothorax or pleural effusion. No rib fracture evident. IMPRESSION: Comminuted fracture lateral right clavicle. No dislocation. No evident rib fracture. No pneumothorax or pleural effusion. Lungs are clear. Stable cardiac silhouette. Electronically Signed   By: Lowella Grip III M.D.   On: 01/02/2018 14:56   Dg Shoulder Right  Result Date: 01/02/2018 CLINICAL DATA:  77 year old female status post fall with distal right clavicle pain. EXAM: RIGHT SHOULDER - 2+ VIEW COMPARISON:  None. FINDINGS: Comminuted fracture of the distal right clavicle extending into the right [facility] joint with mildly displaced butterfly fragments. The right coracoclavicular distance remains normal. The right scapula appears intact. No glenohumeral joint dislocation. The proximal right humerus appears intact with degenerative appearing cortical irregularity at the greater tuberosity. Visible right ribs and lung parenchyma are within normal limits. IMPRESSION:  1. Comminuted distal right clavicle fracture with extension into the right AC joint and mildly displaced butterfly fragments. 2. Right scapula and proximal right humerus appear intact. Electronically Signed   By: Genevie Ann M.D.   On: 01/02/2018 14:25   Ct Head Wo Contrast  Result Date: 01/02/2018 CLINICAL DATA:  Pain following fall EXAM: CT HEAD WITHOUT CONTRAST TECHNIQUE: Contiguous axial images were obtained from the base of the skull through the vertex without intravenous contrast. COMPARISON:  November 28, 2017 head CT and brain MRI Oct 18, 2017 FINDINGS: Brain: The ventricles are normal in size and configuration. 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 There is no intracranial mass, hemorrhage, extra-axial fluid collection, or midline shift. There is minimal periventricular small vessel disease in the centra semiovale bilaterally. No acute infarct evident. Vascular: No evident hyperdense vessel. There is calcification in each carotid siphon region and distal vertebral artery. Skull: Postoperative changes noted in the right occipital bone, stable. Bony calvarium otherwise appears intact and unchanged. Sinuses/Orbits: Mucosal thickening noted in several ethmoid air cells. Evidence of previous antrostomies bilaterally. Other paranasal sinuses which are visualized are clear. Orbits appear symmetric bilaterally. Other: Mastoid air cells are clear. IMPRESSION: Prior postoperative change right occipital region inferiorly with encephalomalacia in the lateral right cerebellum, stable. Minimal periventricular small vessel disease. No acute infarct. No mass or hemorrhage. Foci of arterial vascular calcification noted. Evidence of mild paranasal sinus disease. Electronically Signed   By: Lowella Grip III M.D.   On: 01/02/2018 16:00    Procedures Procedures (including critical care time)  Medications Ordered in ED Medications  oxyCODONE-acetaminophen (PERCOCET/ROXICET) 5-325 MG  per tablet 1 tablet (1 tablet Oral Given 01/02/18 1421)  Initial Impression / Assessment and Plan / ED Course  I have reviewed the triage vital signs and the nursing notes.  Pertinent labs & imaging results that were available during my care of the patient were reviewed by me and considered in my medical decision making (see chart for details).     Very pleasant 77 year old female here with right shoulder pain after mechanical fall.  Imaging shows comminuted distal clavicle fracture.  Patient is neurovascularly intact.  There is no evidence of pneumothorax, rib fracture, or other thoracic complication.  Discussed with Dr. Lorin Mercy.  Will place patient in a sling, give analgesia, and have her follow-up in the next several days.  CT head is negative.  She has no other evidence of trauma.  Final Clinical Impressions(s) / ED Diagnoses   Final diagnoses:  Closed displaced fracture of acromial end of right clavicle, initial encounter  Fall, initial encounter    ED Discharge Orders         Ordered    oxyCODONE-acetaminophen (PERCOCET/ROXICET) 5-325 MG tablet  Every 6 hours PRN     01/02/18 1559    ondansetron (ZOFRAN ODT) 4 MG disintegrating tablet  Every 8 hours PRN     01/02/18 1559    docusate sodium (COLACE) 250 MG capsule  Daily     01/02/18 1559           Duffy Bruce, MD 01/02/18 1820

## 2018-01-05 ENCOUNTER — Telehealth: Payer: Self-pay | Admitting: Family Medicine

## 2018-01-05 DIAGNOSIS — S42031A Displaced fracture of lateral end of right clavicle, initial encounter for closed fracture: Secondary | ICD-10-CM

## 2018-01-05 NOTE — Telephone Encounter (Signed)
Called and spoke with pt and she stated that she needs to see ortho ---Dr. Ninfa [facility] ortho)  Pt stated that she broke her collar bone and she requires a referral to see this doctor.  Dr. Regis Bill, please advise on the referral.  Thanks.        Looks like she was seen in the ER on 8/13 and has a closed displaced fracture of acromial end of right clavicle.

## 2018-01-05 NOTE — Telephone Encounter (Signed)
Please do referral  asap because she has an acute injury .

## 2018-01-05 NOTE — Telephone Encounter (Signed)
Copied from [place] [id number]. Topic: Referral - Request >> Jan 05, 2018  9:36 AM Sheran Luz wrote: Reason for CRM: Pt called to request a referral to an orthopedic specialist. Pt would like a call back at [phone]. Pt's phone was breaking up badly and pt got disconnected before I received anymore information.

## 2018-01-05 NOTE — Telephone Encounter (Signed)
Referral has been placed and nothing further is needed.

## 2018-01-09 ENCOUNTER — Ambulatory Visit: Payer: Self-pay | Admitting: *Deleted

## 2018-01-09 NOTE — Progress Notes (Signed)
Chief Complaint  Patient presents with  . Constipation    x 1 week. Pt feels related to her pain meds given for her shoulder pain. Pt states that her stomach feels bloated and having nausea. Some abdominal pain/discomfort. Using Miralax and Colace x 1 week. Pt states that last night she woke up and needed to go to the bathroom, pt experienced dizziness and had a fall, pt states that she does not recall falling, her husband found her on the floor and helped her. Pt reports little diarrhea last night.     HPI: [name] 77 y.o. come in for acute problem sda  Had a trip over construction renovation   In a cord left  And hit r shoulder  And got displace d fracture  See ed visit  Had been on percocet for 3 days and not now ocass ibuprofen   Has been constipatied since then   See above had some loose x 1 but feels nauseaous and bloated   No vomiting  fulids ok and no uti sx  But  Took mag citrate.     And had  Had   Diarrhea .    And not that much.  Not a   After sitting down in chair got swimming  Feeling  And  flet like going to pass out.     Last night .  Was up and felt dizzy sitting when called husband and then fell to floor  Then dizzy fall last night   Taking miralax.   Once a day  Neighbors recommendation .     And colace.    Remembers hitting floor  But dizzy  Before no cp sob pal;piations  Sometimes ibuprofen .  Helps discomfort but no bleeding no uti sx no new meds taking her carbamazepine as nl .   Here with frind today to help.  She is concerned that could happen again .   ROS: See pertinent positives and negatives per HPI. No cp sob fever  Blood in stool .  Past Medical History:  Diagnosis Date  . ADJ DISORDER WITH MIXED ANXIETY & DEPRESSED MOOD 03/03/2010   Qualifier: Diagnosis of  By: [name], [name]   . Closed head injury 02/01/2011   from syncope and had scalp laceration  neg ct .    Marland Kitchen Closed head injury 5-6 yrs ago  . Complication of anesthesia    migraine several hours after general anesthesia  . Fatty liver   . Gall stones 2016   see ct scan neg HIDA   . GERD (gastroesophageal reflux disease)   . Hearing aid worn   . HOH (hard of hearing)    both ears  . Hyperlipidemia   . Hypertension    echo nl lv function  mild dilitation 2009  . Kidney infection    few yrs ago in hospital  . Medication side effect 09/02/2010   Poss muscle se of 10 crestor   . Migraine    hypnic HA eval by Dr. Earley Favor in the past  . Polycythemia   . 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 Syncope 02/01/2011   In shower on vacation  sustained head laceration  8 sutures Had ed visit neg head ct labs and x ray   . Trigeminal neuralgia pain     Family History  Problem Relation Age of Onset  . Ovarian cancer  Mother   . Stroke Mother   . Alcohol abuse Father   . Stroke Father   . Diabetes Brother   . Cancer Paternal Aunt        leukemia, unknown type  . Seizures Daughter   . Hypertension Unknown   . Colon cancer Neg Hx     Social History   Socioeconomic History  . Marital status: Married    Spouse name: Not on file  . Number of children: 2  . Years of education: Not on file  . Highest education level: Not on file  Occupational History    Comment: retired Forensic psychologist  Social Needs  . Financial resource strain: Not on file  . Food insecurity:    Worry: Not on file    Inability: Not on file  . Transportation needs:    Medical: Not on file    Non-medical: Not on file  Tobacco Use  . Smoking status: Never Smoker  . Smokeless tobacco: Never Used  Substance and Sexual Activity  . Alcohol use: Yes    Alcohol/week: 2.0 standard drinks    Types: 2 Glasses of wine per week    Comment: occ wine  . Drug use: No  . Sexual activity: Not on file  Lifestyle  . Physical activity:    Days per week: Not on file    Minutes per session: Not on file  . Stress: Not on file    Relationships  . Social connections:    Talks on phone: Not on file    Gets together: Not on file    Attends religious service: Not on file    Active member of club or organization: Not on file    Attends meetings of clubs or organizations: Not on file    Relationship status: Not on file  Other Topics Concern  . Not on file  Social History Narrative   Married   HH of 2-3 (god daughter)   Pets 2 dogs   Non smoker    Child is a physician   G2P2             Outpatient Medications Prior to Visit  Medication Sig Dispense Refill  . albuterol (PROVENTIL HFA;VENTOLIN HFA) 108 (90 Base) MCG/ACT inhaler Inhale 2 puffs into the lungs every 6 (six) hours as needed. 1 Inhaler 2  . amLODipine (NORVASC) 10 MG tablet TAKE 1 TABLET BY MOUTH ONCE DAILY 90 tablet 1  . ASCOMP-CODEINE 50-325-40-30 MG capsule TAKE 1-2 CAPSULES BY MOUTH EVERY FOUR TO SIX HOURS AS NEEDED FOR MIGRAINE MAX OF 6 CAPSULES IN 24 HOURS 30 capsule 0  . benazepril (LOTENSIN) 20 MG tablet TAKE 1 TABLET BY MOUTH ONCE DAILY 90 tablet 1  . budesonide-formoterol (SYMBICORT) 160-4.5 MCG/ACT inhaler Inhale 2 puffs into the lungs 2 (two) times daily. (Patient taking differently: Inhale 2 puffs into the lungs 2 (two) times daily as needed. ) 1 Inhaler 2  . chlorthalidone (HYGROTON) 25 MG tablet TAKE 1 TABLET BY MOUTH ONCE DAILY 90 tablet 1  . conjugated estrogens (PREMARIN) vaginal cream Place 1 Applicatorful vaginally 2 (two) times a week.    . docusate sodium (COLACE) 250 MG capsule Take 1 capsule (250 mg total) by mouth daily. While taking pain medications 10 capsule 0  . EPITOL 200 MG tablet TAKE 2 TABLETS BY MOUTH TWICE DAILY 360 tablet 0  . fluticasone (FLONASE) 50 MCG/ACT nasal spray Place 2 sprays into both nostrils as needed for allergies or rhinitis.     Marland Kitchen  ondansetron (ZOFRAN ODT) 4 MG disintegrating tablet Take 1 tablet (4 mg total) by mouth every 8 (eight) hours as needed for nausea or vomiting. 20 tablet 0  .  oxyCODONE-acetaminophen (PERCOCET/ROXICET) 5-325 MG tablet Take 1-2 tablets by mouth every 6 (six) hours as needed for moderate pain or severe pain. 20 tablet 0  . polyethylene glycol powder (MIRALAX) powder Using 1 capful every morning while taking Percocet.    . ranitidine (ZANTAC) 300 MG tablet Take 1 tablet (300 mg total) by mouth at bedtime. 90 tablet 3  . RESTASIS 0.05 % ophthalmic emulsion Place 1 drop into both eyes 2 (two) times daily.   1   No facility-administered medications prior to visit.      EXAM:  BP 106/64 (BP Location: Left Arm, Patient Position: Sitting, Cuff Size: Normal)   Pulse 84   Temp 97.9 F (36.6 C) (Oral)   Wt 140 lb (63.5 kg)   BMI 22.60 kg/m   Body mass index is 22.6 kg/m.  GENERAL: vitals reviewed and listed above, alert, oriented, appears well hydrated and in no acute distress  In [facility]     Cognition intact  Right arm in a sling  HEENT: atraumatic, conjunctiva  clear, no obvious abnormalities on inspection of external nose and ears OP : no lesion edema or exudate  eoms nl  Slight nystagmus.  NECK: no obvious masses on inspection palpation  LUNGS: clear to auscultation bilaterally, no wheezes, rales or rhonchi, good air movement CV: HRRR, no clubbing cyanosis puffy feet no change from baseline  peripheral edema nl cap refill  Skin large hematoma right  Hip thigh area  Non tender   Abd  Soft no masses or point tendernss    Rectal   No masses  Soft stool high in canal  MS: moves all extremities without noticeable focal  abnormality PSYCH: pleasant and cooperative, no obvious depression or anxiety Lab Results  Component Value Date   WBC 6.6 11/29/2017   HGB 14.2 01/10/2018   HCT 40.2 11/29/2017   PLT 177 11/29/2017   GLUCOSE 95 12/26/2017   CHOL 233 (H) 05/30/2017   TRIG 61.0 05/30/2017   HDL 92.00 05/30/2017   LDLDIRECT 135.9 10/16/2012   LDLCALC 129 (H) 05/30/2017   ALT 28 11/28/2017   AST 31 11/28/2017   NA 133 (L) 12/26/2017   K 4.9  12/26/2017   CL 96 12/26/2017   CREATININE 0.91 12/26/2017   BUN 17 12/26/2017   CO2 30 12/26/2017   TSH 1.37 05/30/2017   INR 1.23 03/12/2015   BP Readings from Last 3 Encounters:  01/10/18 106/64  01/10/18 131/78  01/02/18 (!) 149/85   ua pos nitrities rest neg doing ur cx  ASSESSMENT AND PLAN:  Discussed the following assessment and plan:  Dizziness - Plan: POCT Urinalysis Dipstick (Automated), POC Hemoglobin, Urine Culture, Urine Culture  Near syncope - Plan: POCT Urinalysis Dipstick (Automated), POC Hemoglobin, Urine Culture, Urine Culture  Drug-induced constipation  Contusion of right thigh, initial encounter  History of fracture due to fall  Abnormal urinalysis - Plan: Urine Culture, Urine Culture opiod rx per another provider   Ortho   Under care   But off now  Concern about  Medication thea could interact with t he carbemazepam Since off  Med  Will try  Inc  Dose miralax instead  With caution  No obv obstruction or alarm abd sx .  bp on low side and suspect poss  ortho stasis fluid shifts  So stop bp meds temporarily   Until bp  Up in 140 range  And eating better  Continue fluids . R/o uti since she has had many  that presented with dizziness and  Feeling ill .  -Patient advised to return or notify health care team  if  new concerns arise.  Total visit 74mns > 50% spent counseling and coordinating care as indicated in above note and in instructions to patient .    Patient Instructions  Checking urine and hg to make sure not anemic and no uti.  Your bp is a bit low today and could have contributed to the dizziness.   Hold the Bp medications for 1-3 days until Bp comes back up to 130 140 range. c chorthalidone  Amlodipine and benazepril  For now .    Your exam is otherwise reassuring .   Try miralax   1 capful 3 x per day until  wokring and then back off . Fall prevention  If not improving in the next few days  Then contact [place]    WStandley Brooking Antionetta Ator M.D.

## 2018-01-09 NOTE — Telephone Encounter (Signed)
Pt stating that she has been constipated for a week and believes that constipation is due to opioid use. Pt states she has been taking Percocet for shoulder pain after a fall on 8/13 and only took the medication for 3 1/2 days. Pt states she also took a whole bottle of magnesium citrate this morning and had one small runny bowel movent. Pt states she has been taking colace daily and started taking Miralax on yesterday.Pt states she does have abdominal pain that comes and goes and feels that her abdomen is bloated. Pt advised that she would need to be evaluated in the office for current symptoms. Pt verbalized understanding but also states she would like a prescription for Movantik, which she was told was used specifically for opioid induced constipation. Pt scheduled for appt on 8/21 and advised that if symptoms became worse during the night to seek treatment in the Ed. Pt verbalized understanding.  Reason for Disposition . Abdomen is more swollen than usual  Answer Assessment - Initial Assessment Questions 1. STOOL PATTERN OR FREQUENCY: "How often do you pass bowel movements (BMs)?"  (Normal range: tid to q 3 days)  "When was the last BM passed?"       This morning she took magnesium citrate and had a small runny bowel movement 2. STRAINING: "Do you have to strain to have a BM?"      Has not been able to have bowel movement 3. RECTAL PAIN: "Does your rectum hurt when the stool comes out?" If so, ask: "Do you have hemorrhoids? How bad is the pain?"  (Scale 1-10; or mild, moderate, severe)     No comoplaints of pain 4. STOOL COMPOSITION: "Are the stools hard?"      Runny bowel movement this morning from taking magnesium citrate 5. BLOOD ON STOOLS: "Has there been any blood on the toilet tissue or on the surface of the BM?" If so, ask: "When was the last time?"      No 6. CHRONIC CONSTIPATION: "Is this a new problem for you?"  If no, ask: "How long have you had this problem?" (days, weeks, months)   yes 7. CHANGES IN DIET: "Have there been any recent changes in your diet?"      No 8. MEDICATIONS: "Have you been taking any new medications?"     Started taking percocet a week and one day 9. LAXATIVES: "Have you been using any laxatives or enemas?"  If yes, ask "What, how often, and when was the last time?"     Magnesium citrate was used today and takes colace every morning since starting percocet and also has taken miralax beginning on yesterday 10. CAUSE: "What do you think is causing the constipation?"        Using percocet which was prescribed for shoulder pain after a fall on 01/02/18 11. OTHER SYMPTOMS: "Do you have any other symptoms?" (e.g., abdominal pain, fever, vomiting)      Abdominal pain-8, comes and goes for about two days now  Protocols used: CONSTIPATION-A-AH

## 2018-01-10 ENCOUNTER — Ambulatory Visit (INDEPENDENT_AMBULATORY_CARE_PROVIDER_SITE_OTHER): Payer: Self-pay | Admitting: Orthopaedic Surgery

## 2018-01-10 ENCOUNTER — Encounter (INDEPENDENT_AMBULATORY_CARE_PROVIDER_SITE_OTHER): Payer: Self-pay | Admitting: Orthopaedic Surgery

## 2018-01-10 ENCOUNTER — Ambulatory Visit (INDEPENDENT_AMBULATORY_CARE_PROVIDER_SITE_OTHER): Payer: Medicare Other

## 2018-01-10 ENCOUNTER — Ambulatory Visit (INDEPENDENT_AMBULATORY_CARE_PROVIDER_SITE_OTHER): Payer: Medicare Other | Admitting: Orthopaedic Surgery

## 2018-01-10 ENCOUNTER — Ambulatory Visit: Payer: Medicare Other | Admitting: Internal Medicine

## 2018-01-10 ENCOUNTER — Encounter: Payer: Self-pay | Admitting: Internal Medicine

## 2018-01-10 VITALS — BP 131/78 | HR 71 | Ht 66.0 in | Wt 140.0 lb

## 2018-01-10 VITALS — BP 106/64 | HR 84 | Temp 97.9°F | Wt 140.0 lb

## 2018-01-10 DIAGNOSIS — M898X1 Other specified disorders of bone, shoulder: Secondary | ICD-10-CM

## 2018-01-10 DIAGNOSIS — R42 Dizziness and giddiness: Secondary | ICD-10-CM

## 2018-01-10 DIAGNOSIS — S7011XA Contusion of right thigh, initial encounter: Secondary | ICD-10-CM

## 2018-01-10 DIAGNOSIS — R55 Syncope and collapse: Secondary | ICD-10-CM | POA: Diagnosis not present

## 2018-01-10 DIAGNOSIS — S42031A Displaced fracture of lateral end of right clavicle, initial encounter for closed fracture: Secondary | ICD-10-CM

## 2018-01-10 DIAGNOSIS — R8290 Unspecified abnormal findings in urine: Secondary | ICD-10-CM

## 2018-01-10 DIAGNOSIS — K5903 Drug induced constipation: Secondary | ICD-10-CM | POA: Diagnosis not present

## 2018-01-10 DIAGNOSIS — Z8781 Personal history of (healed) traumatic fracture: Secondary | ICD-10-CM

## 2018-01-10 LAB — POC URINALSYSI DIPSTICK (AUTOMATED)
Bilirubin, UA: NEGATIVE
Blood, UA: NEGATIVE
Glucose, UA: NEGATIVE
Ketones, UA: NEGATIVE
Leukocytes, UA: NEGATIVE
Nitrite, UA: POSITIVE
Protein, UA: NEGATIVE
Spec Grav, UA: 1.015 (ref 1.010–1.025)
Urobilinogen, UA: 0.2 E.U./dL
pH, UA: 6 (ref 5.0–8.0)

## 2018-01-10 LAB — POCT HEMOGLOBIN: Hemoglobin: 14.2 g/dL (ref 12.2–16.2)

## 2018-01-10 NOTE — Patient Instructions (Addendum)
Checking urine and hg to make sure not anemic and no uti.  Your bp is a bit low today and could have contributed to the dizziness.   Hold the Bp medications for 1-3 days until Bp comes back up to 130 140 range. c chorthalidone  Amlodipine and benazepril  For now .    Your exam is otherwise reassuring .   Try miralax   1 capful 3 x per day until  wokring and then back off . Fall prevention  If not improving in the next few days  Then contact us.

## 2018-01-10 NOTE — Telephone Encounter (Signed)
FYI. Patient has appt today at 3:45.

## 2018-01-10 NOTE — Progress Notes (Signed)
Office Visit Note   Patient: [name]           Date of Birth: [birth year]           MRN: [id number] Visit Date: 01/10/2018              Requested by: [name], MD [place], [place] [address] PCP: [name], MD   Assessment & Plan: Visit Diagnoses:  1. Pain of right clavicle   2. Closed displaced fracture of acromial end of right clavicle, initial encounter     Plan: I gave her reassurance that she should do well.  She is not a smoker not a diabetic.  Although his common fracture there is no soft tissue compromise in the anatomic structures of her shoulder look good.  She will come in and out of the sling as comfort allows with letting her body be to guide in terms of the activities that she does perform.  We will see her back in 3 weeks with a repeat 2 views of the right clavicle.  All question concerns were answered and addressed.  Follow-Up Instructions: Return in about 3 weeks (around 01/31/2018).   Orders:  Orders Placed This Encounter  Procedures  . XR Clavicle Right   No orders of the defined types were placed in this encounter.     Procedures: No procedures performed   Clinical Data: No additional findings.   Subjective: Chief Complaint  Patient presents with  . Right Shoulder - Fracture    Right Clavicle Fracture  The patient comes in for evaluation treatment  of a known right clavicle fracture.  She sustained a mechanical fall about a week and a half ago tripping over cords in the house.  She landed on her right shoulder.  She had no shoulder  issues before.  She actually noticed feel well as well as Dr. Harlow Asa who recommended she see [place].  She denies any neck pain.  Denies any numbness and tingling in her hand.  Her husband is with her today.  She is been on some pain medication but does not prefer to take these.  She is come out of her sling for hygiene purposes.  HPI  Review of Systems She currently denies any headache, chest pain, shortness of breath, fever, chills, nausea, vomiting.  Objective: Vital Signs: BP 131/78   Pulse 71   Ht 5' 6"  (1.676 m)   Wt 140 lb (63.5 kg)   BMI 22.60 kg/m   Physical Exam She is alert and oriented x3 and in no acute distress Ortho Exam Examination of her right shoulder shows significant bruising around the shoulder girdle itself.  There is no soft tissue compromise at all.  Her shoulder is clinically well located.  Her neck exam is normal.  Her elbow and hand exam on the right side is normal. Specialty Comments:  No specialty comments available.  Imaging: Xr Clavicle Right  Result Date: 01/10/2018 2 views of the right clavicle show a comminuted lateral clavicle fracture.  There is good alignment at the [facility] joint as well as the relationship of the clavicle and the coracoid process indicating ligamentous stability.    PMFS History: Patient Active Problem List   Diagnosis Date Noted  . Colitis 11/27/2017  . Diarrhea 11/27/2017  . Closed head injury 11/27/2015  . Unilateral headache 05/26/2015  . Pyelonephritis 03/12/2015  . ARF (acute renal failure) ([place]) 03/12/2015  . Gallstones 03/12/2015  . Nausea without vomiting 12/10/2014  . Essential hypertension 05/29/2014  . 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 Abdominal pain 05/29/2013  . Agent resistant to multiple antibiotics 05/29/2013  . Low back  pain radiating to right leg 01/16/2013  . Sinus problem 11/20/2012  . Chronic headaches morning 10/19/2012  . Low sodium levels  133 10/19/2012  . Leg pain, posterior 08/16/2012  . Sciatic neuritis 08/16/2012  . Sleep disturbance, unspecified 08/16/2012  . Medication management 03/24/2012  . Preventative health care 09/24/2011  . Trigeminal neuralgia 09/24/2011  . Postmenopausal HRT (hormone replacement therapy) 09/24/2011  . Polycythemia 09/20/2011  . Syncope, vasovagal 02/01/2011  . Morning headache 02/01/2011  . Hearing aid worn   . Abnormal blood finding   . LOCALIZED SUPERFICIAL SWELLING MASS OR LUMP 09/21/2009  . DYSPNEA 05/07/2008  . Hypnic headache 03/19/2008  . 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 Headache(784.0) 06/05/2007  . JAW PAIN 04/06/2007  . OSTEOARTHRITIS 04/06/2007  . ABNORMAL RESULT, FUNCTION STUDY, [place] 02/05/2007  . HYPERLIPIDEMIA 12/25/2006  . HYPERTENSION 12/25/2006   Past Medical History:  Diagnosis Date  . ADJ DISORDER WITH MIXED ANXIETY & DEPRESSED MOOD 03/03/2010   Qualifier: Diagnosis of  By: [name], [name]   . Closed head injury 02/01/2011   from syncope and had scalp laceration  neg ct .    Marland Kitchen Closed head injury 5-6 yrs ago  . Complication of anesthesia    migraine several hours after general anesthesia  . Fatty liver   . Gall stones 2016   see ct scan neg HIDA   . GERD (gastroesophageal reflux disease)   . Hearing aid worn   . HOH (hard of hearing)    both ears  . Hyperlipidemia   . Hypertension    echo nl lv function  mild dilitation 2009  . Kidney infection  few yrs ago in hospital  . Medication side effect 09/02/2010   Poss muscle se of 10 crestor   . Migraine    hypnic HA eval by Dr. Earley Favor in the past  . Polycythemia   . 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 Syncope 02/01/2011   In shower on vacation  sustained head laceration  8  sutures Had ed visit neg head ct labs and x ray   . Trigeminal neuralgia pain     Family History  Problem Relation Age of Onset  . Ovarian cancer Mother   . Stroke Mother   . Alcohol abuse Father   . Stroke Father   . Diabetes Brother   . Cancer Paternal Aunt        leukemia, unknown type  . Seizures Daughter   . Hypertension Unknown   . Colon cancer Neg Hx     Past Surgical History:  Procedure Laterality Date  . ABDOMINAL HYSTERECTOMY  2002   tubal  . CARDIAC CATHETERIZATION  2000   chest pains neg  . CHOLECYSTECTOMY N/A 02/21/2017   Procedure: LAPAROSCOPIC CHOLECYSTECTOMY WITH INTRAOPERATIVE CHOLANGIOGRAM;  Surgeon: Armandina Gemma, MD;  Location: [facility];  Service: General;  Laterality: N/A;  . COLONOSCOPY     multiple  . CRANIOTOMY  12/09/2011   nerve decompression right trigeminal   . DOPPLER ECHOCARDIOGRAPHY  2009   nl lv function mild lv dilitation  . EYE SURGERY Bilateral    ioc for catatracts  . laparoscopic gallbladder surgery  02/16/2017   Fax from [facility]  . OOPHORECTOMY Bilateral 2002  . rt shoulder surgery     Social History   Occupational History    Comment: retired Forensic psychologist  Tobacco Use  . Smoking status: Never Smoker  . Smokeless tobacco: Never Used  Substance and Sexual Activity  . Alcohol use: Yes    Alcohol/week: 2.0 standard drinks    Types: 2 Glasses of wine per week    Comment: occ wine  . Drug use: No  . Sexual activity: Not on file

## 2018-01-12 ENCOUNTER — Telehealth: Payer: Self-pay | Admitting: *Deleted

## 2018-01-12 ENCOUNTER — Other Ambulatory Visit: Payer: Self-pay | Admitting: Internal Medicine

## 2018-01-12 LAB — URINE CULTURE
MICRO NUMBER:: 90997054
SPECIMEN QUALITY:: ADEQUATE

## 2018-01-12 MED ORDER — AMOXICILLIN 500 MG PO CAPS: 500.0000 mg | ORAL_CAPSULE | Freq: Three times a day (TID) | ORAL | 0 refills | Status: DC

## 2018-01-12 MED ORDER — ONDANSETRON 4 MG PO TBDP: 4.0000 mg | ORAL_TABLET | Freq: Three times a day (TID) | ORAL | 0 refills | Status: DC | PRN

## 2018-01-12 MED ORDER — DOXYCYCLINE HYCLATE 100 MG PO TABS: 100.0000 mg | ORAL_TABLET | Freq: Two times a day (BID) | ORAL | 0 refills | Status: AC

## 2018-01-12 NOTE — Telephone Encounter (Signed)
Copied from [facility] [id number]. Topic: General - Other >> Jan 12, 2018 10:29 AM [name] wrote: Reason for CRM: Patient is requesting a nurse  to give her  a call back, patient wouldn't go into detail what she is needing. Please advised

## 2018-01-12 NOTE — Telephone Encounter (Signed)
Returned call to patient. She is requesting refill on ondansetron 4 mg tablets, last rx'd by ED physician on 01/02/18. Last office visit 01/10/18. Please advise.  Pharmacy: [place], [place]

## 2018-01-12 NOTE — Telephone Encounter (Signed)
Ok to refill the zofran  Disp  20   also see result notes  Her urine test is positive for some bacteria yet to be  identified   So uncertain which  antibiotic would be best for this  Since not   Final information but     Please send in amoxicillin  500 mg 1 po tid for 5 days for the weekend . To treat in the interim .

## 2018-01-12 NOTE — Telephone Encounter (Signed)
Medication filled to pharmacy as requested. Pt notified of results/instructions and verbalized understanding.

## 2018-01-15 ENCOUNTER — Encounter: Payer: Self-pay | Admitting: *Deleted

## 2018-01-15 DIAGNOSIS — N12 Tubulo-interstitial nephritis, not specified as acute or chronic: Secondary | ICD-10-CM

## 2018-01-15 NOTE — Telephone Encounter (Signed)
halfway through the doxycycline and still experiencing some urinary discomfort. will tell you in 2 and a half days..  Do you want to do another culture at the end?   Still not taking blood pressure meds. bp today 140/89, yesterday 138/87.    Multi-doses of Miralax worked. One day of large volume diarrhea.( 4 days ago) Not much since. Taking Colace only to keep regular. Plan to resume one/day dose of miralax if nothing soon.   Dr. Regis Bill please advise. Thanks

## 2018-01-16 NOTE — Telephone Encounter (Signed)
Thanks for the update    After finishing the antibiotic and off for 2 or more days      Would  Recheck  UA with [facility] . And then go from there .  Would add back BP meds one at a time  And slowly  When bp consistently over 140  And follow

## 2018-01-16 NOTE — Telephone Encounter (Signed)
Called and spoke with pt and she is aware of lab oreders and she will come in on 9/4 to repeat her ua with micro.  Pt is aware of Dr. Regis Bill recs and will slowly add back her BP meds.

## 2018-01-23 DIAGNOSIS — H04123 Dry eye syndrome of bilateral lacrimal glands: Secondary | ICD-10-CM | POA: Diagnosis not present

## 2018-01-24 ENCOUNTER — Ambulatory Visit: Payer: Medicare Other | Admitting: Internal Medicine

## 2018-01-24 ENCOUNTER — Ambulatory Visit (INDEPENDENT_AMBULATORY_CARE_PROVIDER_SITE_OTHER): Payer: Medicare Other

## 2018-01-24 ENCOUNTER — Encounter: Payer: Self-pay | Admitting: Internal Medicine

## 2018-01-24 ENCOUNTER — Other Ambulatory Visit (INDEPENDENT_AMBULATORY_CARE_PROVIDER_SITE_OTHER): Payer: Medicare Other

## 2018-01-24 VITALS — BP 130/72 | HR 74 | Wt 140.0 lb

## 2018-01-24 DIAGNOSIS — S99922A Unspecified injury of left foot, initial encounter: Secondary | ICD-10-CM

## 2018-01-24 DIAGNOSIS — S99912A Unspecified injury of left ankle, initial encounter: Secondary | ICD-10-CM

## 2018-01-24 DIAGNOSIS — M79672 Pain in left foot: Secondary | ICD-10-CM | POA: Diagnosis not present

## 2018-01-24 DIAGNOSIS — I10 Essential (primary) hypertension: Secondary | ICD-10-CM | POA: Diagnosis not present

## 2018-01-24 DIAGNOSIS — N12 Tubulo-interstitial nephritis, not specified as acute or chronic: Secondary | ICD-10-CM

## 2018-01-24 DIAGNOSIS — M7989 Other specified soft tissue disorders: Secondary | ICD-10-CM | POA: Diagnosis not present

## 2018-01-24 DIAGNOSIS — M25572 Pain in left ankle and joints of left foot: Secondary | ICD-10-CM | POA: Diagnosis not present

## 2018-01-24 LAB — URINALYSIS, ROUTINE W REFLEX MICROSCOPIC
Hgb urine dipstick: NEGATIVE
Ketones, ur: NEGATIVE
Leukocytes, UA: NEGATIVE
Nitrite: NEGATIVE
RBC / HPF: NONE SEEN (ref 0–?)
Specific Gravity, Urine: 1.02 (ref 1.000–1.030)
Total Protein, Urine: NEGATIVE
Urine Glucose: NEGATIVE
Urobilinogen, UA: 0.2 (ref 0.0–1.0)
pH: 6 (ref 5.0–8.0)

## 2018-01-24 NOTE — Patient Instructions (Signed)
X ray shows no fracture of your foot or ankle .  Continue ice ane elevation . For a contusion  Of the foot ankle region   consider seeing ortho or sports medicine  If not getting better .

## 2018-01-24 NOTE — Progress Notes (Signed)
Chief Complaint  Patient presents with  . Foot Injury    Pt dropped a container of about 2-3lbs on her left ankle/top of foot x 4 days ago. Pt ankle is visibly swollen and pt reports pain 5/10 at rest and 8/10 with ambulation and weight bearing    HPI: [name] 77 y.o. come in for  Walk in to be seen  Cause of  Left foot pain  Onset as above 4 days ago when block of cheese fell on foot out of fridge just getting over  Right clarivular fracture   Some pain swelling but  Not too mbad and then last pm hurt worse in middle of night with pain and used ice .    Has burised on her toes that she says if rfrom dog stepping onher feet a lot and not of concern.  Denies stwisting fall other trauma  ROS: See pertinent positives and negatives per HPI. No cp sob  Past Medical History:  Diagnosis Date  . ADJ DISORDER WITH MIXED ANXIETY & DEPRESSED MOOD 03/03/2010   Qualifier: Diagnosis of  By: [name], [name]   . Closed head injury 02/01/2011   from syncope and had scalp laceration  neg ct .    Marland Kitchen Closed head injury 5-6 yrs ago  . Complication of anesthesia    migraine several hours after general anesthesia  . Fatty liver   . Gall stones 2016   see ct scan neg HIDA   . GERD (gastroesophageal reflux disease)   . Hearing aid worn   . HOH (hard of hearing)    both ears  . Hyperlipidemia   . Hypertension    echo nl lv function  mild dilitation 2009  . Kidney infection    few yrs ago in hospital  . Medication side effect 09/02/2010   Poss muscle se of 10 crestor   . Migraine    hypnic HA eval by Dr. Earley Favor in the past  . Polycythemia   . 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 Syncope 02/01/2011   In shower on vacation  sustained head laceration  8 sutures Had ed visit neg head ct labs and x ray   . Trigeminal neuralgia pain     Family History  Problem Relation Age of Onset  . Ovarian cancer Mother   . Stroke  Mother   . Alcohol abuse Father   . Stroke Father   . Diabetes Brother   . Cancer Paternal Aunt        leukemia, unknown type  . Seizures Daughter   . Hypertension Unknown   . Colon cancer Neg Hx     Social History   Socioeconomic History  . Marital status: Married    Spouse name: Not on file  . Number of children: 2  . Years of education: Not on file  . Highest education level: Not on file  Occupational History    Comment: retired Forensic psychologist  Social Needs  . Financial resource strain: Not on file  . Food insecurity:    Worry: Not on file    Inability: Not on file  . Transportation needs:    Medical: Not on file    Non-medical: Not on file  Tobacco Use  . Smoking status: Never Smoker  . Smokeless tobacco: Never Used  Substance and Sexual Activity  . Alcohol use: Yes  Alcohol/week: 2.0 standard drinks    Types: 2 Glasses of wine per week    Comment: occ wine  . Drug use: No  . Sexual activity: Not on file  Lifestyle  . Physical activity:    Days per week: Not on file    Minutes per session: Not on file  . Stress: Not on file  Relationships  . Social connections:    Talks on phone: Not on file    Gets together: Not on file    Attends religious service: Not on file    Active member of club or organization: Not on file    Attends meetings of clubs or organizations: Not on file    Relationship status: Not on file  Other Topics Concern  . Not on file  Social History Narrative   Married   HH of 2-3 (god daughter)   Pets 2 dogs   Non smoker    Child is a physician   G2P2             Outpatient Medications Prior to Visit  Medication Sig Dispense Refill  . albuterol (PROVENTIL HFA;VENTOLIN HFA) 108 (90 Base) MCG/ACT inhaler Inhale 2 puffs into the lungs every 6 (six) hours as needed. 1 Inhaler 2  . amLODipine (NORVASC) 10 MG tablet TAKE 1 TABLET BY MOUTH ONCE DAILY 90 tablet 1  . ASCOMP-CODEINE 50-325-40-30 MG capsule TAKE 1-2 CAPSULES BY MOUTH  EVERY FOUR TO SIX HOURS AS NEEDED FOR MIGRAINE MAX OF 6 CAPSULES IN 24 HOURS 30 capsule 0  . benazepril (LOTENSIN) 20 MG tablet TAKE 1 TABLET BY MOUTH ONCE DAILY 90 tablet 1  . budesonide-formoterol (SYMBICORT) 160-4.5 MCG/ACT inhaler Inhale 2 puffs into the lungs 2 (two) times daily. (Patient taking differently: Inhale 2 puffs into the lungs 2 (two) times daily as needed. ) 1 Inhaler 2  . conjugated estrogens (PREMARIN) vaginal cream Place 1 Applicatorful vaginally 2 (two) times a week.    . EPITOL 200 MG tablet TAKE 2 TABLETS BY MOUTH TWICE DAILY 360 tablet 0  . fluticasone (FLONASE) 50 MCG/ACT nasal spray Place 2 sprays into both nostrils as needed for allergies or rhinitis.     Marland Kitchen ondansetron (ZOFRAN ODT) 4 MG disintegrating tablet Take 1 tablet (4 mg total) by mouth every 8 (eight) hours as needed for nausea or vomiting. 20 tablet 0  . ranitidine (ZANTAC) 300 MG tablet Take 1 tablet (300 mg total) by mouth at bedtime. 90 tablet 3  . RESTASIS 0.05 % ophthalmic emulsion Place 1 drop into both eyes 2 (two) times daily.   1  . chlorthalidone (HYGROTON) 25 MG tablet TAKE 1 TABLET BY MOUTH ONCE DAILY (Patient not taking: Reported on 01/24/2018) 90 tablet 1  . docusate sodium (COLACE) 250 MG capsule Take 1 capsule (250 mg total) by mouth daily. While taking pain medications (Patient not taking: Reported on 01/24/2018) 10 capsule 0  . amoxicillin (AMOXIL) 500 MG capsule Take 1 capsule (500 mg total) by mouth 3 (three) times daily. (Patient not taking: Reported on 01/24/2018) 15 capsule 0  . oxyCODONE-acetaminophen (PERCOCET/ROXICET) 5-325 MG tablet Take 1-2 tablets by mouth every 6 (six) hours as needed for moderate pain or severe pain. (Patient not taking: Reported on 01/24/2018) 20 tablet 0  . polyethylene glycol powder (MIRALAX) powder Using 1 capful every morning while taking Percocet.     No facility-administered medications prior to visit.      EXAM:  BP 130/72 (BP Location: Right Arm, Patient  Position: Sitting,  Cuff Size: Normal)   Pulse 74   Wt 140 lb (63.5 kg) Comment: pt reported - too far to walk with injured foot  BMI 22.60 kg/m   Body mass index is 22.6 kg/m.  GENERAL: vitals reviewed and listed above, alert, oriented, appears well hydrated and in no acute distress  Out o sling  Non toxic  HEENT: atraumatic, conjunctiva  clear, no obvious abnormalities on inspection of external nose and ears MS: moves all extremities  Ambulatory .   Both ankles feet are puffy but left ankel and latera mid foot is more swollen  And distal purplish ecchymosis of mid toes without pain or swelling  Tender  At lateral malleola and mid foot lateral   Neg  Pain at squeeze fibula    Circulation seems normal  PSYCH: pleasant and cooperative, no obvious depression or anxiety  BP Readings from Last 3 Encounters:  01/24/18 130/72  01/10/18 106/64  01/10/18 131/78   Wt Readings from Last 3 Encounters:  01/24/18 140 lb (63.5 kg)  01/10/18 140 lb (63.5 kg)  01/10/18 140 lb (63.5 kg)    ASSESSMENT AND PLAN:  Discussed the following assessment and plan:  Injury of left ankle and foot, initial encounter - Plan: DG Foot Complete Left, DG Ankle Complete Left  Essential hypertension - improved back on  2 meds and holding  x ray  Foot ankle today  Contusion  Left foot  X-ray of foot and ankle are both negative for fracture positive soft tissue swelling. Suspect a severe contusion because of mechanism of injury.  Elevation ice will send this note to Dr. Ninfa Linden although he is following up for her clavicular fracture would be helpful if Ortho office can evaluate the foot if it is ongoing without improvement. Total visit 79mns > 50% spent counseling and coordinating care as indicated in above note and in instructions to patient .  Acute unscheduled visit    -Patient advised to return or notify health care team  if  new concerns arise.  Patient Instructions  X ray shows no fracture of your foot or  ankle .  Continue ice ane elevation . For a contusion  Of the foot ankle region   consider seeing ortho or sports medicine  If not getting better .     WStandley Brooking Quantarius Genrich M.D.

## 2018-01-31 ENCOUNTER — Encounter (INDEPENDENT_AMBULATORY_CARE_PROVIDER_SITE_OTHER): Payer: Self-pay | Admitting: Orthopaedic Surgery

## 2018-01-31 ENCOUNTER — Ambulatory Visit (INDEPENDENT_AMBULATORY_CARE_PROVIDER_SITE_OTHER): Payer: Medicare Other | Admitting: Orthopaedic Surgery

## 2018-01-31 ENCOUNTER — Ambulatory Visit (INDEPENDENT_AMBULATORY_CARE_PROVIDER_SITE_OTHER): Payer: Medicare Other

## 2018-01-31 DIAGNOSIS — S42031D Displaced fracture of lateral end of right clavicle, subsequent encounter for fracture with routine healing: Secondary | ICD-10-CM

## 2018-01-31 DIAGNOSIS — M79672 Pain in left foot: Secondary | ICD-10-CM

## 2018-01-31 NOTE — Progress Notes (Signed)
The patient is getting close to 4 weeks status post a right lateral clavicle fracture.  She says she is doing better overall and is not wearing a sling.  She did drop a container on her left foot just after injuring her clavicle.  She was seen by her primary care physician and x-rays were obtained and told there is no fracture but she still having pain and swelling.  On examination both her feet she does have pitting edema around her feet and ankles bilaterally.  She is tender on the anterior talofibular ligament area of the foot and ankle and there is some redness in this area but no evidence of infection she is just is tender on the other side as well and again there is pitting edema bilaterally.  X-rays of the foot did not show any significant fracture at all or malalignment of the right foot.  There is a soft tissue swelling.  Swords her right shoulder goes she is moving around much easier.  There is still no soft tissue compromise around the lateral clavicle area at all.  X-rays show that the fracture is comminuted but main pain is a good position in terms of eventual healing.  She understands that at age 77 and with osteopenic bones will take a while to heal radiographically but clinically she seems to be doing better.  At this point we will see her back in a month with a repeat just single view of the right clavicle.  I again reiterated the importance of compressive garments for bilateral extremity peripheral edema.

## 2018-02-28 ENCOUNTER — Ambulatory Visit (INDEPENDENT_AMBULATORY_CARE_PROVIDER_SITE_OTHER): Payer: Medicare Other | Admitting: Orthopaedic Surgery

## 2018-02-28 ENCOUNTER — Encounter (INDEPENDENT_AMBULATORY_CARE_PROVIDER_SITE_OTHER): Payer: Self-pay | Admitting: Orthopaedic Surgery

## 2018-02-28 ENCOUNTER — Ambulatory Visit (INDEPENDENT_AMBULATORY_CARE_PROVIDER_SITE_OTHER): Payer: Medicare Other

## 2018-02-28 DIAGNOSIS — S42031D Displaced fracture of lateral end of right clavicle, subsequent encounter for fracture with routine healing: Secondary | ICD-10-CM

## 2018-02-28 NOTE — Progress Notes (Signed)
Office Visit Note   Patient: [name]           Date of Birth: May 23, 1941           MRN: [id number] Visit Date: 02/28/2018              Requested by: [name], MD [place], [place] [address] PCP: [name], MD   Assessment & Plan: Visit Diagnoses:  1. Closed displaced fracture of acromial end of right clavicle with routine healing, subsequent encounter     Plan: We will send her to physical therapy for range of motion of the right shoulder, home exercise program, modalities and strengthening which they are to go slow at.  We will see her back in 1 month obtain single view of the right clavicle.  Did discuss with [name] this may take 3 to 6 months to completely consolidate the fracture site.  Follow-Up Instructions: Return in about 4 weeks (around 03/28/2018) for Radiographs.   Orders:  Orders Placed This Encounter  Procedures  . XR Clavicle Right   No orders of the defined types were placed in this encounter.     Procedures: No procedures performed   Clinical Data: No additional findings.   Subjective: Chief Complaint  Patient presents with  . Right Shoulder - Fracture, Follow-up    HPI [name] returns today follow-up of her right distal clavicle fracture.  She is now 2 months status post injury.  She states the a.m. she is very sore but overall she is trending towards improvement.  States the arm is very weak and has limited range of motion. Review of Systems Please see HPI otherwise noncontributory  Objective: Vital Signs: There were no vitals taken for this visit.  Physical Exam  Constitutional: She is oriented to person, place, and time. She appears well-developed and well-nourished. No distress.  Pulmonary/Chest: Effort normal.  Neurological: She is alert and oriented to person, place, and time.  Skin: She is not diaphoretic.    Ortho Exam Right shoulder she is able to bring her arm up to approximately 90  degrees without pain.  Then forward flex up to 200 6070 degrees but this causes pain.  She has good range of motion the elbow wrist and hand.  Fluid range of motion the shoulder otherwise. Specialty Comments:  No specialty comments available.  Imaging: Xr Clavicle Right  Result Date: 02/28/2018 Right clavicle: Multiple views show further consolidation of the distal clavicle fracture.  No change in overall position alignment.  AC joint overall is still well aligned.  No other fractures identified.    PMFS History: Patient Active Problem List   Diagnosis Date Noted  . Colitis 11/27/2017  . Diarrhea 11/27/2017  . Closed head injury 11/27/2015  . Unilateral headache 05/26/2015  . ARF (acute renal failure) ([facility]) 03/12/2015  . Gallstones 03/12/2015  . Nausea without vomiting 12/10/2014  . Essential hypertension 05/29/2014  . 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 Abdominal pain 05/29/2013  . Agent resistant to multiple antibiotics 05/29/2013  . Low back pain radiating to right leg 01/16/2013  . Sinus problem 11/20/2012  . Chronic headaches morning 10/19/2012  . Low sodium levels  133 10/19/2012  . Leg pain, posterior 08/16/2012  . Sciatic neuritis 08/16/2012  . Sleep disturbance, unspecified 08/16/2012  . Medication management 03/24/2012  . Preventative health care 09/24/2011  . Trigeminal neuralgia 09/24/2011  . Postmenopausal HRT (hormone replacement therapy) 09/24/2011  . Polycythemia 09/20/2011  . Morning headache  02/01/2011  . Hearing aid worn   . Abnormal blood finding   . LOCALIZED SUPERFICIAL SWELLING MASS OR LUMP 09/21/2009  . DYSPNEA 05/07/2008  . Hypnic headache 03/19/2008  . 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 Headache(784.0) 06/05/2007  . JAW PAIN 04/06/2007  . OSTEOARTHRITIS 04/06/2007  . ABNORMAL RESULT, FUNCTION STUDY, [place] 02/05/2007  . HYPERLIPIDEMIA 12/25/2006  . HYPERTENSION 12/25/2006   Past Medical History:  Diagnosis Date    . ADJ DISORDER WITH MIXED ANXIETY & DEPRESSED MOOD 03/03/2010   Qualifier: Diagnosis of  By: [name], [name]   . Closed head injury 02/01/2011   from syncope and had scalp laceration  neg ct .    Marland Kitchen Closed head injury 5-6 yrs ago  . Complication of anesthesia    migraine several hours after general anesthesia  . Fatty liver   . Gall stones 2016   see ct scan neg HIDA   . GERD (gastroesophageal reflux disease)   . Hearing aid worn   . HOH (hard of hearing)    both ears  . Hyperlipidemia   . Hypertension    echo nl lv function  mild dilitation 2009  . Kidney infection    few yrs ago in hospital  . Medication side effect 09/02/2010   Poss muscle se of 10 crestor   . Migraine    hypnic HA eval by Dr. Earley Favor in the past  . Polycythemia   . 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 Pyelonephritis 03/12/2015  . 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 Syncope 02/01/2011   In shower on vacation  sustained head laceration  8 sutures Had ed visit neg head ct labs and x ray   . Trigeminal neuralgia pain     Family History  Problem Relation Age of Onset  . Ovarian cancer Mother   . Stroke Mother   . Alcohol abuse Father   . Stroke Father   . Diabetes Brother   . Cancer Paternal Aunt        leukemia, unknown type  . Seizures Daughter   . Hypertension Unknown   . Colon cancer Neg Hx     Past Surgical History:  Procedure Laterality Date  . ABDOMINAL HYSTERECTOMY  2002   tubal  . CARDIAC CATHETERIZATION  2000   chest pains neg  . CHOLECYSTECTOMY N/A 02/21/2017   Procedure: LAPAROSCOPIC CHOLECYSTECTOMY WITH INTRAOPERATIVE CHOLANGIOGRAM;  Surgeon: Armandina Gemma, MD;  Location: [facility];  Service: General;  Laterality: N/A;  . COLONOSCOPY     multiple  . CRANIOTOMY  12/09/2011   nerve decompression right trigeminal   . DOPPLER ECHOCARDIOGRAPHY  2009   nl lv function mild lv dilitation  . EYE SURGERY Bilateral    ioc for catatracts  . laparoscopic gallbladder  surgery  02/16/2017   Fax from [facility]  . OOPHORECTOMY Bilateral 2002  . rt shoulder surgery     Social History   Occupational History    Comment: retired Forensic psychologist  Tobacco Use  . Smoking status: Never Smoker  . Smokeless tobacco: Never Used  Substance and Sexual Activity  . Alcohol use: Yes    Alcohol/week: 2.0 standard drinks    Types: 2 Glasses of wine per week    Comment: occ wine  . Drug use: No  . Sexual activity: Not on file

## 2018-03-06 ENCOUNTER — Other Ambulatory Visit: Payer: Self-pay | Admitting: Internal Medicine

## 2018-03-12 DIAGNOSIS — M25511 Pain in right shoulder: Secondary | ICD-10-CM | POA: Diagnosis not present

## 2018-03-13 ENCOUNTER — Other Ambulatory Visit: Payer: Self-pay

## 2018-03-14 MED ORDER — CARBAMAZEPINE 200 MG PO TABS: 400.0000 mg | ORAL_TABLET | Freq: Two times a day (BID) | ORAL | 0 refills | Status: DC

## 2018-03-19 DIAGNOSIS — M25511 Pain in right shoulder: Secondary | ICD-10-CM | POA: Diagnosis not present

## 2018-03-20 ENCOUNTER — Ambulatory Visit (INDEPENDENT_AMBULATORY_CARE_PROVIDER_SITE_OTHER): Payer: Medicare Other

## 2018-03-20 ENCOUNTER — Other Ambulatory Visit: Payer: Self-pay | Admitting: Internal Medicine

## 2018-03-20 DIAGNOSIS — Z23 Encounter for immunization: Secondary | ICD-10-CM

## 2018-03-27 ENCOUNTER — Other Ambulatory Visit: Payer: Self-pay | Admitting: Internal Medicine

## 2018-04-03 DIAGNOSIS — M25511 Pain in right shoulder: Secondary | ICD-10-CM | POA: Diagnosis not present

## 2018-04-10 DIAGNOSIS — M25511 Pain in right shoulder: Secondary | ICD-10-CM | POA: Diagnosis not present

## 2018-04-11 ENCOUNTER — Encounter (INDEPENDENT_AMBULATORY_CARE_PROVIDER_SITE_OTHER): Payer: Self-pay | Admitting: Orthopaedic Surgery

## 2018-04-11 ENCOUNTER — Ambulatory Visit (INDEPENDENT_AMBULATORY_CARE_PROVIDER_SITE_OTHER): Payer: Medicare Other

## 2018-04-11 ENCOUNTER — Ambulatory Visit (INDEPENDENT_AMBULATORY_CARE_PROVIDER_SITE_OTHER): Payer: Medicare Other | Admitting: Orthopaedic Surgery

## 2018-04-11 DIAGNOSIS — S42031D Displaced fracture of lateral end of right clavicle, subsequent encounter for fracture with routine healing: Secondary | ICD-10-CM | POA: Diagnosis not present

## 2018-04-11 NOTE — Progress Notes (Signed)
Office Visit Note   Patient: [name]           Date of Birth: 05/27/1940           MRN: [id number] Visit Date: 04/11/2018              Requested by: [name], MD [place], [place] [address] PCP: [name], MD   Assessment & Plan: Visit Diagnoses:  1. Closed displaced fracture of acromial end of right clavicle with routine healing     Plan: She is activities as tolerated.  I encouraged her to continue to work with physical therapy and getting a good home exercise program before discontinuing formal therapy.  Then she should perform the exercises as taught by therapy for at least the next 6 months to a year.  She could have some aching and discomfort with certain motions for the next 6 months to be here.  She will follow-up on as-needed basis or if pain persist or becomes worse.  Questions were encouraged and answered.  Follow-Up Instructions: Return if symptoms worsen or fail to improve.   Orders:  Orders Placed This Encounter  Procedures  . XR Clavicle Right   No orders of the defined types were placed in this encounter.     Procedures: No procedures performed   Clinical Data: No additional findings.   Subjective: Chief Complaint  Patient presents with  . Right Shoulder - Follow-up    HPI [name] returns today follow-up of her right lateral clavicle fracture.  She is now 3 months status post injury.  She is been going to formal physical therapy.  She feels therapy is definitely helping with her range of motion strength.  She notes that she cannot reach up into the mid of her back like she could prior to the injury.  Still has discomfort with extreme ranges of motion of the right shoulder. Review of Systems See HPI otherwise negative  Objective: Vital Signs: There were no vitals taken for this visit.  Physical Exam General: Well-developed well-nourished female no acute distress mood and affect appropriate Ortho  Exam Right shoulder she has 5 out of 5 strength external and internal rotation against resistance.  She is able to reach fully above her head.  She is able to internally rotate and reach up into the mid of her back slightly lower than the left with her left hand. Specialty Comments:  No specialty comments available.  Imaging: Xr Clavicle Right  Result Date: 04/11/2018 2 views right clavicle: Interval healing with significant consolidation.  The fracture appears well-healed.  The acromioclavicular joint remains well maintained.  No other bony abnormalities.    PMFS History: Patient Active Problem List   Diagnosis Date Noted  . Colitis 11/27/2017  . Diarrhea 11/27/2017  . Closed head injury 11/27/2015  . Unilateral headache 05/26/2015  . ARF (acute renal failure) ([facility]) 03/12/2015  . Gallstones 03/12/2015  . Nausea without vomiting 12/10/2014  . Essential hypertension 05/29/2014  . 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 Abdominal pain 05/29/2013  . Agent resistant to multiple antibiotics 05/29/2013  . Low back pain radiating to right leg 01/16/2013  . Sinus problem 11/20/2012  . Chronic headaches morning 10/19/2012  . Low sodium levels  133 10/19/2012  . Leg pain, posterior 08/16/2012  . Sciatic neuritis 08/16/2012  . Sleep disturbance, unspecified 08/16/2012  . Medication management 03/24/2012  . Preventative health care 09/24/2011  . Trigeminal neuralgia 09/24/2011  . Postmenopausal HRT (hormone  replacement therapy) 09/24/2011  . Polycythemia 09/20/2011  . Morning headache 02/01/2011  . Hearing aid worn   . Abnormal blood finding   . LOCALIZED SUPERFICIAL SWELLING MASS OR LUMP 09/21/2009  . DYSPNEA 05/07/2008  . Hypnic headache 03/19/2008  . 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 Headache(784.0) 06/05/2007  . JAW PAIN 04/06/2007  . OSTEOARTHRITIS 04/06/2007  . ABNORMAL RESULT, FUNCTION STUDY, [place] 02/05/2007  . HYPERLIPIDEMIA 12/25/2006  .  HYPERTENSION 12/25/2006   Past Medical History:  Diagnosis Date  . ADJ DISORDER WITH MIXED ANXIETY & DEPRESSED MOOD 03/03/2010   Qualifier: Diagnosis of  By: [name], [name]   . Closed head injury 02/01/2011   from syncope and had scalp laceration  neg ct .    Marland Kitchen Closed head injury 5-6 yrs ago  . Complication of anesthesia    migraine several hours after general anesthesia  . Fatty liver   . Gall stones 2016   see ct scan neg HIDA   . GERD (gastroesophageal reflux disease)   . Hearing aid worn   . HOH (hard of hearing)    both ears  . Hyperlipidemia   . Hypertension    echo nl lv function  mild dilitation 2009  . Kidney infection    few yrs ago in hospital  . Medication side effect 09/02/2010   Poss muscle se of 10 crestor   . Migraine    hypnic HA eval by Dr. Earley Favor in the past  . Polycythemia   . 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 Pyelonephritis 03/12/2015  . 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 Syncope 02/01/2011   In shower on vacation  sustained head laceration  8 sutures Had ed visit neg head ct labs and x ray   . Trigeminal neuralgia pain     Family History  Problem Relation Age of Onset  . Ovarian cancer Mother   . Stroke Mother   . Alcohol abuse Father   . Stroke Father   . Diabetes Brother   . Cancer Paternal Aunt        leukemia, unknown type  . Seizures Daughter   . Hypertension Unknown   . Colon cancer Neg Hx     Past Surgical History:  Procedure Laterality Date  . ABDOMINAL HYSTERECTOMY  2002   tubal  . CARDIAC CATHETERIZATION  2000   chest pains neg  . CHOLECYSTECTOMY N/A 02/21/2017   Procedure: LAPAROSCOPIC CHOLECYSTECTOMY WITH INTRAOPERATIVE CHOLANGIOGRAM;  Surgeon: Armandina Gemma, MD;  Location: [facility];  Service: General;  Laterality: N/A;  . COLONOSCOPY     multiple  . CRANIOTOMY  12/09/2011   nerve decompression right trigeminal   . DOPPLER ECHOCARDIOGRAPHY  2009   nl lv function mild lv dilitation  . EYE  SURGERY Bilateral    ioc for catatracts  . laparoscopic gallbladder surgery  02/16/2017   Fax from [facility]  . OOPHORECTOMY Bilateral 2002  . rt shoulder surgery     Social History   Occupational History    Comment: retired Forensic psychologist  Tobacco Use  . Smoking status: Never Smoker  . Smokeless tobacco: Never Used  Substance and Sexual Activity  . Alcohol use: Yes    Alcohol/week: 2.0 standard drinks    Types: 2 Glasses of wine per week    Comment: occ wine  . Drug use: No  . Sexual activity: Not on file

## 2018-05-07 ENCOUNTER — Other Ambulatory Visit: Payer: Self-pay | Admitting: Internal Medicine

## 2018-05-25 ENCOUNTER — Ambulatory Visit (INDEPENDENT_AMBULATORY_CARE_PROVIDER_SITE_OTHER): Payer: Medicare Other

## 2018-05-25 ENCOUNTER — Ambulatory Visit (INDEPENDENT_AMBULATORY_CARE_PROVIDER_SITE_OTHER): Payer: Medicare Other | Admitting: Internal Medicine

## 2018-05-25 ENCOUNTER — Encounter: Payer: Self-pay | Admitting: Internal Medicine

## 2018-05-25 VITALS — BP 134/82 | HR 75 | Temp 98.1°F | Ht 65.2 in | Wt 143.2 lb

## 2018-05-25 DIAGNOSIS — S5002XA Contusion of left elbow, initial encounter: Secondary | ICD-10-CM

## 2018-05-25 DIAGNOSIS — Z8781 Personal history of (healed) traumatic fracture: Secondary | ICD-10-CM | POA: Diagnosis not present

## 2018-05-25 DIAGNOSIS — Z0000 Encounter for general adult medical examination without abnormal findings: Secondary | ICD-10-CM

## 2018-05-25 DIAGNOSIS — I10 Essential (primary) hypertension: Secondary | ICD-10-CM

## 2018-05-25 DIAGNOSIS — G500 Trigeminal neuralgia: Secondary | ICD-10-CM | POA: Diagnosis not present

## 2018-05-25 DIAGNOSIS — Z79899 Other long term (current) drug therapy: Secondary | ICD-10-CM | POA: Diagnosis not present

## 2018-05-25 DIAGNOSIS — Z9181 History of falling: Secondary | ICD-10-CM | POA: Diagnosis not present

## 2018-05-25 DIAGNOSIS — M25522 Pain in left elbow: Secondary | ICD-10-CM | POA: Diagnosis not present

## 2018-05-25 DIAGNOSIS — E785 Hyperlipidemia, unspecified: Secondary | ICD-10-CM

## 2018-05-25 DIAGNOSIS — E2839 Other primary ovarian failure: Secondary | ICD-10-CM

## 2018-05-25 DIAGNOSIS — S59902A Unspecified injury of left elbow, initial encounter: Secondary | ICD-10-CM | POA: Diagnosis not present

## 2018-05-25 LAB — CBC WITH DIFFERENTIAL/PLATELET
Basophils Absolute: 0.1 10*3/uL (ref 0.0–0.1)
Basophils Relative: 1.3 % (ref 0.0–3.0)
Eosinophils Absolute: 0.1 10*3/uL (ref 0.0–0.7)
Eosinophils Relative: 3.3 % (ref 0.0–5.0)
HCT: 45.5 % (ref 36.0–46.0)
Hemoglobin: 15.4 g/dL — ABNORMAL HIGH (ref 12.0–15.0)
Lymphocytes Relative: 18 % (ref 12.0–46.0)
Lymphs Abs: 0.7 10*3/uL (ref 0.7–4.0)
MCHC: 33.9 g/dL (ref 30.0–36.0)
MCV: 88.6 fl (ref 78.0–100.0)
Monocytes Absolute: 0.4 10*3/uL (ref 0.1–1.0)
Monocytes Relative: 10.6 % (ref 3.0–12.0)
Neutro Abs: 2.7 10*3/uL (ref 1.4–7.7)
Neutrophils Relative %: 66.8 % (ref 43.0–77.0)
Platelets: 215 10*3/uL (ref 150.0–400.0)
RBC: 5.14 Mil/uL — ABNORMAL HIGH (ref 3.87–5.11)
RDW: 12.9 % (ref 11.5–15.5)
WBC: 4 10*3/uL (ref 4.0–10.5)

## 2018-05-25 LAB — LIPID PANEL
Cholesterol: 259 mg/dL — ABNORMAL HIGH (ref 0–200)
HDL: 123.3 mg/dL (ref >39.00)
LDL Cholesterol: 124 mg/dL — ABNORMAL HIGH (ref 0–99)
NonHDL: 135.4
Total CHOL/HDL Ratio: 2
Triglycerides: 58 mg/dL (ref 0.0–149.0)
VLDL: 11.6 mg/dL (ref 0.0–40.0)

## 2018-05-25 LAB — BASIC METABOLIC PANEL
BUN: 16 mg/dL (ref 6–23)
CO2: 32 mEq/L (ref 19–32)
Calcium: 9.6 mg/dL (ref 8.4–10.5)
Chloride: 96 mEq/L (ref 96–112)
Creatinine, Ser: 0.83 mg/dL (ref 0.40–1.20)
GFR: 70.51 mL/min (ref >60.00)
Glucose, Bld: 85 mg/dL (ref 70–99)
Potassium: 4.3 mEq/L (ref 3.5–5.1)
Sodium: 136 mEq/L (ref 135–145)

## 2018-05-25 LAB — VITAMIN D 25 HYDROXY (VIT D DEFICIENCY, FRACTURES): VITD: 19.97 ng/mL — ABNORMAL LOW (ref 30.00–100.00)

## 2018-05-25 LAB — HEPATIC FUNCTION PANEL
ALT: 19 U/L (ref 0–35)
AST: 21 U/L (ref 0–37)
Albumin: 4.1 g/dL (ref 3.5–5.2)
Alkaline Phosphatase: 104 U/L (ref 39–117)
Bilirubin, Direct: 0.2 mg/dL (ref 0.0–0.3)
Total Bilirubin: 0.7 mg/dL (ref 0.2–1.2)
Total Protein: 6 g/dL (ref 6.0–8.3)

## 2018-05-25 LAB — TSH: TSH: 0.94 u[IU]/mL (ref 0.35–4.50)

## 2018-05-25 LAB — HEMOGLOBIN A1C: Hgb A1c MFr Bld: 5.5 % (ref 4.6–6.5)

## 2018-05-25 NOTE — Patient Instructions (Addendum)
consdier tai chi other conditioning for fall prevention.  X ray elbow today .  Will notify you  of labs when available.   Fu 6 months depending on results fpr med check  Get appt for bone density scan at the [facility]       Preventive Care 65 Years and Older, Female Preventive care refers to lifestyle choices and visits with your health care provider that can promote health and wellness. What does preventive care include?  A yearly physical exam. This is also called an annual well check.  Dental exams once or twice a year.  Routine eye exams. Ask your health care provider how often you should have your eyes checked.  Personal lifestyle choices, including: ? Daily care of your teeth and gums. ? Regular physical activity. ? Eating a healthy diet. ? Avoiding tobacco and drug use. ? Limiting alcohol use. ? Practicing safe sex. ? Taking low-dose aspirin every day. ? Taking vitamin and mineral supplements as recommended by your health care provider. What happens during an annual well check? The services and screenings done by your health care provider during your annual well check will depend on your age, overall health, lifestyle risk factors, and family history of disease. Counseling Your health care provider may ask you questions about your:  Alcohol use.  Tobacco use.  Drug use.  Emotional well-being.  Home and relationship well-being.  Sexual activity.  Eating habits.  History of falls.  Memory and ability to understand (cognition).  Work and work Statistician.  Reproductive health.  Screening You may have the following tests or measurements:  Height, weight, and BMI.  Blood pressure.  Lipid and cholesterol levels. These may be checked every 5 years, or more frequently if you are over 83 years old.  Skin check.  Lung cancer screening. You may have this screening every year starting at age 48 if you have a 30-pack-year history of smoking and currently  smoke or have quit within the past 15 years.  Colorectal cancer screening. All adults should have this screening starting at age 60 and continuing until age 56. You will have tests every 1-10 years, depending on your results and the type of screening test. People at increased risk should start screening at an earlier age. Screening tests may include: ? Guaiac-based fecal occult blood testing. ? Fecal immunochemical test (FIT). ? Stool DNA test. ? Virtual colonoscopy. ? Sigmoidoscopy. During this test, a flexible tube with a tiny camera (sigmoidoscope) is used to examine your rectum and lower colon. The sigmoidoscope is inserted through your anus into your rectum and lower colon. ? Colonoscopy. During this test, a long, thin, flexible tube with a tiny camera (colonoscope) is used to examine your entire colon and rectum.  Hepatitis C blood test.  Hepatitis B blood test.  Sexually transmitted disease (STD) testing.  Diabetes screening. This is done by checking your blood sugar (glucose) after you have not eaten for a while (fasting). You may have this done every 1-3 years.  Bone density scan. This is done to screen for osteoporosis. You may have this done starting at age 16.  Mammogram. This may be done every 1-2 years. Talk to your health care provider about how often you should have regular mammograms. Talk with your health care provider about your test results, treatment options, and if necessary, the need for more tests. Vaccines Your health care provider may recommend certain vaccines, such as:  Influenza vaccine. This is recommended every year.  Tetanus,  diphtheria, and acellular pertussis (Tdap, Td) vaccine. You may need a Td booster every 10 years.  Varicella vaccine. You may need this if you have not been vaccinated.  Zoster vaccine. You may need this after age 31.  Measles, mumps, and rubella (MMR) vaccine. You may need at least one dose of MMR if you were born in 1957 or  later. You may also need a second dose.  Pneumococcal 13-valent conjugate (PCV13) vaccine. One dose is recommended after age 50.  Pneumococcal polysaccharide (PPSV23) vaccine. One dose is recommended after age 39.  Meningococcal vaccine. You may need this if you have certain conditions.  Hepatitis A vaccine. You may need this if you have certain conditions or if you travel or work in places where you may be exposed to hepatitis A.  Hepatitis B vaccine. You may need this if you have certain conditions or if you travel or work in places where you may be exposed to hepatitis B.  Haemophilus influenzae type b (Hib) vaccine. You may need this if you have certain conditions. Talk to your health care provider about which screenings and vaccines you need and how often you need them. This information is not intended to replace advice given to you by your health care provider. Make sure you discuss any questions you have with your health care provider. Document Released: 06/05/2015 Document Revised: 06/29/2017 Document Reviewed: 03/10/2015 Elsevier Interactive Patient Education  2019 [employer].

## 2018-05-25 NOTE — Progress Notes (Signed)
Chief Complaint  Patient presents with  . Annual Exam    HPI: [name] 78 y.o. comes in today for Preventive Medicare exam/ wellness visit .and Chronic disease management   Bp no problems  [name]  On tegretol still controlling stable at this time  Gi non longer on ranitidine cause of recall.  Rare me rescue HA med.  No recent utis  On topical estrogens  Shoulder recovering  FELL lst week tryin gto let down sofar   Lifted with usband and her and came down to fast so fell left elbow not too tender until this week and very tender at latera  Elbow but nl functino.   Health Maintenance  Topic Date Due  . DEXA SCAN  07/14/2004  . TETANUS/TDAP  09/18/2021  . INFLUENZA VACCINE  Completed  . PNA vac Low Risk Adult  Completed   Health Maintenance Review LIFESTYLE:  Exercise:    Since fx shoulder    Fell last Sunday .  Tobacco/ETS: no Alcohol:  Rare  Sugar beverages: almost no   Sleep:  5-6  Drug use: no HH: 2     Hearing: some decrease stable   Vision:  No limitations at present . Last eye check UTD  Safety:  Has smoke detector and wears seat belts.  No firearms. No excess sun exposure. Sees dentist regularly.  Falls:  yes  Depression: No anhedonia unusual crying or depressive symptoms  Nutrition: Eats well balanced diet; adequate calcium and vitamin D. No swallowing chewing problems.  Other healthcare providers:  Reviewed today .  Preventive parameters:   Reviewed   ADLS:   There are no problems or need for assistance  driving, feeding, obtaining food, dressing, toileting and bathing, managing money using phone. She is independent.    ROS:  GEN/ HEENT: No fever, significant weight changes sweatsvision problems hearing changes, CV/ PULM; No chest pain shortness of breath cough, syncope,edema  change in exercise tolerance. GI /GU: No adominal pain, vomiting, change in bowel habits. No blood in the stool. No significant GU symptoms. SKIN/HEME: ,no  acute skin rashes suspicious lesions or bleeding. No lymphadenopathy, nodules, masses.  NEURO/ PSYCH:  No neurologic signs such as weakness numbness. No depression anxiety. IMM/ Allergy: No unusual infections.  Allergy .   REST of 12 system review negative except as per HPI   Past Medical History:  Diagnosis Date  . ADJ DISORDER WITH MIXED ANXIETY & DEPRESSED MOOD 03/03/2010   Qualifier: Diagnosis of  By: [name], [name]   . Closed head injury 02/01/2011   from syncope and had scalp laceration  neg ct .    Marland Kitchen Closed head injury 5-6 yrs ago  . Complication of anesthesia    migraine several hours after general anesthesia  . Fatty liver   . Gall stones 2016   see ct scan neg HIDA   . GERD (gastroesophageal reflux disease)   . Hearing aid worn   . HOH (hard of hearing)    both ears  . Hyperlipidemia   . Hypertension    echo nl lv function  mild dilitation 2009  . Kidney infection    few yrs ago in hospital  . Medication side effect 09/02/2010   Poss muscle se of 10 crestor   . Migraine    hypnic HA eval by Dr. Earley Favor in the past  . Polycythemia   . 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 Pyelonephritis 03/12/2015  . Sensation of  pain in anesthetized distribution of trigeminal nerve   . Syncope 02/01/2011   In shower on vacation  sustained head laceration  8 sutures Had ed visit neg head ct labs and x ray   . Trigeminal neuralgia pain     Family History  Problem Relation Age of Onset  . Ovarian cancer Mother   . Stroke Mother   . Alcohol abuse Father   . Stroke Father   . Diabetes Brother   . Cancer Paternal Aunt        leukemia, unknown type  . Seizures Daughter   . Hypertension Other   . Colon cancer Neg Hx     Social History   Socioeconomic History  . Marital status: Married    Spouse name: Not on file  . Number of children: 2  . Years of education: Not on file  . Highest education level: Not on file  Occupational History    Comment: retired Forensic psychologist    Social Needs  . Financial resource strain: Not on file  . Food insecurity:    Worry: Not on file    Inability: Not on file  . Transportation needs:    Medical: Not on file    Non-medical: Not on file  Tobacco Use  . Smoking status: Never Smoker  . Smokeless tobacco: Never Used  Substance and Sexual Activity  . Alcohol use: Yes    Alcohol/week: 2.0 standard drinks    Types: 2 Glasses of wine per week    Comment: occ wine  . Drug use: No  . Sexual activity: Not on file  Lifestyle  . Physical activity:    Days per week: Not on file    Minutes per session: Not on file  . Stress: Not on file  Relationships  . Social connections:    Talks on phone: Not on file    Gets together: Not on file    Attends religious service: Not on file    Active member of club or organization: Not on file    Attends meetings of clubs or organizations: Not on file    Relationship status: Not on file  Other Topics Concern  . Not on file  Social History Narrative   Married   HH of 2-3 (god daughter)   Pets 2 dogs   Non smoker    Child is a physician   G2P2             Outpatient Encounter Medications as of 05/25/2018  Medication Sig  . albuterol (PROVENTIL HFA;VENTOLIN HFA) 108 (90 Base) MCG/ACT inhaler Inhale 2 puffs into the lungs every 6 (six) hours as needed.  Marland Kitchen amLODipine (NORVASC) 10 MG tablet TAKE 1 TABLET BY MOUTH ONCE DAILY  . ASCOMP-CODEINE 50-325-40-30 MG capsule TAKE 1-2 CAPSULES BY MOUTH EVERY FOUR TO SIX HOURS AS NEEDED FOR MIGRAINE MAX OF 6 CAPSULES IN 24 HOURS  . benazepril (LOTENSIN) 20 MG tablet TAKE 1 TABLET BY MOUTH ONCE DAILY  . budesonide-formoterol (SYMBICORT) 160-4.5 MCG/ACT inhaler Inhale 2 puffs into the lungs 2 (two) times daily. (Patient taking differently: Inhale 2 puffs into the lungs 2 (two) times daily as needed. )  . carbamazepine (EPITOL) 200 MG tablet Take 2 tablets (400 mg total) by mouth 2 (two) times daily.  . chlorthalidone (HYGROTON) 25 MG tablet TAKE 1  TABLET BY MOUTH ONCE DAILY  . conjugated estrogens (PREMARIN) vaginal cream Place 1 Applicatorful vaginally 2 (two) times a week.  . docusate sodium (COLACE) 250  MG capsule Take 1 capsule (250 mg total) by mouth daily. While taking pain medications  . fluticasone (FLONASE) 50 MCG/ACT nasal spray Place 2 sprays into both nostrils as needed for allergies or rhinitis.   . RESTASIS 0.05 % ophthalmic emulsion Place 1 drop into both eyes 2 (two) times daily.   . [DISCONTINUED] ondansetron (ZOFRAN ODT) 4 MG disintegrating tablet Take 1 tablet (4 mg total) by mouth every 8 (eight) hours as needed for nausea or vomiting. (Patient not taking: Reported on 05/25/2018)  . [DISCONTINUED] ranitidine (ZANTAC) 300 MG tablet Take 1 tablet (300 mg total) by mouth at bedtime. (Patient not taking: Reported on 05/25/2018)   No facility-administered encounter medications on file as of 05/25/2018.     EXAM:  BP 134/82 (BP Location: Right Arm, Patient Position: Sitting, Cuff Size: Normal)   Pulse 75   Temp 98.1 F (36.7 C) (Oral)   Ht 5' 5.2" (1.656 m)   Wt 143 lb 3.2 oz (65 kg)   BMI 23.68 kg/m   Body mass index is 23.68 kg/m.  Physical Exam: Vital signs reviewed TKP:TWSF is a well-developed well-nourished alert cooperative   who appears stated age in no acute distress.  HEENT: normocephalic atraumatic , Eyes: PERRL EOM's full, conjunctiva clear, Nares: paten,t no deformity discharge or tenderness., Ears: no deformity EAC's clear TMs with normal landmarks. Mouth: clear OP, no lesions, edema.  Moist mucous membranes. Dentition in adequate repair. NECK: supple without masses, thyromegaly or bruits. CHEST/PULM:  Clear to auscultation and percussion breath sounds equal no wheeze , rales or rhonchi. No chest wall deformities or tenderness. CV: PMI is nondisplaced, S1 S2 no gallops, murmurs, rubs. Peripheral pulses are without delay.No JVD .  ABDOMEN: Bowel sounds normal nontender  No guard or rebound, no hepato  splenomegal no CVA tenderness.   Extremtities:  No clubbing cyanosis or edema, no acute joint swelling or redness no focal atrophy left  prox ulna  Contusion very tender but nl room of elbow and no atrophy  Radial gangion cyst   Non toendern 1 cm .  NEURO:  Oriented x3, cranial nerves 3-12 appear to be intact, no obvious focal weakness,gait within normal limits no abnormal reflexes or asymmetrical SKIN: No acute rashes normal turgor, color, no  petechiae. PSYCH: Oriented, good eye contact, no obvious depression anxiety, cognition and judgment appear normal. LN: no cervical axillary inguinal adenopathy No noted deficits in memory, attention, and speech.   Lab Results  Component Value Date   WBC 4.0 05/25/2018   HGB 15.4 (H) 05/25/2018   HCT 45.5 05/25/2018   PLT 215.0 05/25/2018   GLUCOSE 85 05/25/2018   CHOL 259 (H) 05/25/2018   TRIG 58.0 05/25/2018   HDL 123.30 05/25/2018   LDLDIRECT 135.9 10/16/2012   LDLCALC 124 (H) 05/25/2018   ALT 19 05/25/2018   AST 21 05/25/2018   NA 136 05/25/2018   K 4.3 05/25/2018   CL 96 05/25/2018   CREATININE 0.83 05/25/2018   BUN 16 05/25/2018   CO2 32 05/25/2018   TSH 0.94 05/25/2018   INR 1.23 03/12/2015   HGBA1C 5.5 05/25/2018    ASSESSMENT AND PLAN:  Discussed the following assessment and plan:  Visit for preventive health examination  Medication management - Plan: Basic metabolic panel, CBC with Differential/Platelet, Hemoglobin A1c, Hepatic function panel, Lipid panel, TSH, VITAMIN D 25 Hydroxy (Vit-D Deficiency, Fractures)  Essential hypertension - Plan: Basic metabolic panel, CBC with Differential/Platelet, Hemoglobin A1c, Hepatic function panel, Lipid panel, TSH  Hyperlipidemia, unspecified  hyperlipidemia type - Plan: Basic metabolic panel, CBC with Differential/Platelet, Hemoglobin A1c, Hepatic function panel, Lipid panel, TSH  Trigeminal neuralgia - Plan: Basic metabolic panel, CBC with Differential/Platelet, Hemoglobin A1c,  Hepatic function panel, Lipid panel, TSH  History of fall - Plan: Basic metabolic panel, CBC with Differential/Platelet, Hemoglobin A1c, Hepatic function panel, Lipid panel, TSH, DG Elbow Complete Left  Contusion of left elbow, initial encounter - Plan: Basic metabolic panel, CBC with Differential/Platelet, Hemoglobin A1c, Hepatic function panel, Lipid panel, TSH, DG Elbow Complete Left  History of fracture - Plan: Basic metabolic panel, CBC with Differential/Platelet, Hemoglobin A1c, Hepatic function panel, Lipid panel, TSH, VITAMIN D 25 Hydroxy (Vit-D Deficiency, Fractures), DG Elbow Complete Left, DG Bone Density  Estrogen deficiency - Plan: DG Bone Density Hx of recurrent falling.    Mechanical  Not cv causes  Has had exercises usually a reason    To get gym silver  Membership and taking rpecautions . This fall was helpoing steady a  [place].   Not interested in specific PT  For this but is aware of  precautions and conditioning  dexa vit d check and routine monitoring .  Get dexa  Patient Care Team: Danford Tat, [name], MD as PCP - General Cindie Crumbly (Neurosurgery) Irine Seal, MD as Attending Physician (Urology) Pieter Partridge, DO as Consulting Physician (Neurology)  Patient Instructions  consdier tai chi other conditioning for fall prevention.  X ray elbow today .  Will notify you  of labs when available.   Fu 6 months depending on results fpr med check  Get appt for bone density scan at the [facility]       Preventive Care 65 Years and Older, Female Preventive care refers to lifestyle choices and visits with your health care provider that can promote health and wellness. What does preventive care include?  A yearly physical exam. This is also called an annual well check.  Dental exams once or twice a year.  Routine eye exams. Ask your health care provider how often you should have your eyes checked.  Personal lifestyle choices, including: ? Daily care of  your teeth and gums. ? Regular physical activity. ? Eating a healthy diet. ? Avoiding tobacco and drug use. ? Limiting alcohol use. ? Practicing safe sex. ? Taking low-dose aspirin every day. ? Taking vitamin and mineral supplements as recommended by your health care provider. What happens during an annual well check? The services and screenings done by your health care provider during your annual well check will depend on your age, overall health, lifestyle risk factors, and family history of disease. Counseling Your health care provider may ask you questions about your:  Alcohol use.  Tobacco use.  Drug use.  Emotional well-being.  Home and relationship well-being.  Sexual activity.  Eating habits.  History of falls.  Memory and ability to understand (cognition).  Work and work Statistician.  Reproductive health.  Screening You may have the following tests or measurements:  Height, weight, and BMI.  Blood pressure.  Lipid and cholesterol levels. These may be checked every 5 years, or more frequently if you are over 27 years old.  Skin check.  Lung cancer screening. You may have this screening every year starting at age 84 if you have a 30-pack-year history of smoking and currently smoke or have quit within the past 15 years.  Colorectal cancer screening. All adults should have this screening starting at age 39 and continuing until  age 67. You will have tests every 1-10 years, depending on your results and the type of screening test. People at increased risk should start screening at an earlier age. Screening tests may include: ? Guaiac-based fecal occult blood testing. ? Fecal immunochemical test (FIT). ? Stool DNA test. ? Virtual colonoscopy. ? Sigmoidoscopy. During this test, a flexible tube with a tiny camera (sigmoidoscope) is used to examine your rectum and lower colon. The sigmoidoscope is inserted through your anus into your rectum and lower  colon. ? Colonoscopy. During this test, a long, thin, flexible tube with a tiny camera (colonoscope) is used to examine your entire colon and rectum.  Hepatitis C blood test.  Hepatitis B blood test.  Sexually transmitted disease (STD) testing.  Diabetes screening. This is done by checking your blood sugar (glucose) after you have not eaten for a while (fasting). You may have this done every 1-3 years.  Bone density scan. This is done to screen for osteoporosis. You may have this done starting at age 63.  Mammogram. This may be done every 1-2 years. Talk to your health care provider about how often you should have regular mammograms. Talk with your health care provider about your test results, treatment options, and if necessary, the need for more tests. Vaccines Your health care provider may recommend certain vaccines, such as:  Influenza vaccine. This is recommended every year.  Tetanus, diphtheria, and acellular pertussis (Tdap, Td) vaccine. You may need a Td booster every 10 years.  Varicella vaccine. You may need this if you have not been vaccinated.  Zoster vaccine. You may need this after age 20.  Measles, mumps, and rubella (MMR) vaccine. You may need at least one dose of MMR if you were born in 1957 or later. You may also need a second dose.  Pneumococcal 13-valent conjugate (PCV13) vaccine. One dose is recommended after age 60.  Pneumococcal polysaccharide (PPSV23) vaccine. One dose is recommended after age 36.  Meningococcal vaccine. You may need this if you have certain conditions.  Hepatitis A vaccine. You may need this if you have certain conditions or if you travel or work in places where you may be exposed to hepatitis A.  Hepatitis B vaccine. You may need this if you have certain conditions or if you travel or work in places where you may be exposed to hepatitis B.  Haemophilus influenzae type b (Hib) vaccine. You may need this if you have certain  conditions. Talk to your health care provider about which screenings and vaccines you need and how often you need them. This information is not intended to replace advice given to you by your health care provider. Make sure you discuss any questions you have with your health care provider. Document Released: 06/05/2015 Document Revised: 06/29/2017 Document Reviewed: 03/10/2015 Elsevier Interactive Patient Education  2019 [employer]. Sever[employer] Paolo M.D.

## 2018-05-28 ENCOUNTER — Ambulatory Visit (INDEPENDENT_AMBULATORY_CARE_PROVIDER_SITE_OTHER)
Admission: RE | Admit: 2018-05-28 | Discharge: 2018-05-28 | Disposition: A | Discharge status: Home / Self Care | Payer: Medicare Other | Source: Ambulatory Visit | Attending: Internal Medicine | Admitting: Internal Medicine

## 2018-05-28 DIAGNOSIS — E2839 Other primary ovarian failure: Secondary | ICD-10-CM | POA: Diagnosis not present

## 2018-05-28 DIAGNOSIS — Z8781 Personal history of (healed) traumatic fracture: Secondary | ICD-10-CM

## 2018-06-03 DIAGNOSIS — E2839 Other primary ovarian failure: Secondary | ICD-10-CM | POA: Diagnosis not present

## 2018-06-11 NOTE — Telephone Encounter (Signed)
Please advise Dr [name], thanks.   

## 2018-06-13 ENCOUNTER — Other Ambulatory Visit: Payer: Self-pay | Admitting: Internal Medicine

## 2018-06-18 ENCOUNTER — Other Ambulatory Visit: Payer: Self-pay | Admitting: Internal Medicine

## 2018-07-10 NOTE — Telephone Encounter (Signed)
apologies for such a late reply     Your bone density is low but not severely for age  However your risk by statistics  For future fractures is above  Risk thresholds so that  Medication  Bone building therapy may be worth trying to reduce the risk of future fractures   This is all probabilities but since you have had a fracture    ( with your last fall )  You could be at the higher risk level for future fracture .   You may want to  Consider bone building therapy  With  Either risedronate,  fosamax,   Reclast( iv infusion )   Or   prolia type meds .  Optiond  for fu next step  (Pick one) 1. discuss this at  Your follow up in the summer    2. make a referral for consu;ltation with endocrinology  3  Make interim  OV with me to discuss results

## 2018-07-11 DIAGNOSIS — Z1231 Encounter for screening mammogram for malignant neoplasm of breast: Secondary | ICD-10-CM | POA: Diagnosis not present

## 2018-07-11 LAB — HM MAMMOGRAPHY

## 2018-07-12 ENCOUNTER — Encounter: Payer: Self-pay | Admitting: Internal Medicine

## 2018-08-03 ENCOUNTER — Other Ambulatory Visit: Payer: Self-pay | Admitting: Internal Medicine

## 2018-08-04 ENCOUNTER — Other Ambulatory Visit: Payer: Self-pay | Admitting: Internal Medicine

## 2018-08-27 NOTE — Telephone Encounter (Signed)
So as of the last week the recommndations about acei and arb are to not stop the meds  . There is no  Data that  Is significant to correlated with  covid complications  But lots of speculation and theory .   If you are doing well  And bp controlled  Would not advise you to stop. ( more risky to stop )      These  medications  As of  March 17 statements from [facility] and [facility] and  European society of HT  After review of available research  Said no clinical evidence to stop ace arb  But being studied for harm AND benefit  For covid19 .    You can  Maybe check  On line their statements  I am not advising people stop     But if you feel uncomfortable. We would have to come up with another  Medication  Plan because untreated HT is a risk.     Hope you are  Staying isolated but well .   [email or web address]  Let me know which way you want to go after reading information

## 2018-09-11 ENCOUNTER — Other Ambulatory Visit: Payer: Self-pay | Admitting: Internal Medicine

## 2018-09-20 ENCOUNTER — Other Ambulatory Visit: Payer: Self-pay | Admitting: Internal Medicine

## 2018-09-23 ENCOUNTER — Other Ambulatory Visit: Payer: Self-pay | Admitting: Internal Medicine

## 2018-10-01 ENCOUNTER — Telehealth: Payer: Self-pay | Admitting: *Deleted

## 2018-10-01 NOTE — Telephone Encounter (Addendum)
Pleas order ua and urine culture  And then video or  Phone visit   Make sure she doesn't have fever chills or more emergent sx

## 2018-10-01 NOTE — Telephone Encounter (Signed)
Copied from [place] [phone]. Topic: General - Inquiry >> Oct 01, 2018 11:53 AM Richardo Priest, NT wrote: Reason for CRM: Patient is calling in stating she would like to inform Dr.[name] that she is having symptoms of another possible bladder infections and she does not want it to morph back into a kidney infection. States Dr.[name] told her to call if she felt like this so she could order lab work. Call back is [phone]. Marland Kitchen

## 2018-10-02 ENCOUNTER — Other Ambulatory Visit: Payer: Self-pay

## 2018-10-02 ENCOUNTER — Encounter: Payer: Self-pay | Admitting: Internal Medicine

## 2018-10-02 ENCOUNTER — Ambulatory Visit (INDEPENDENT_AMBULATORY_CARE_PROVIDER_SITE_OTHER): Payer: Medicare Other | Admitting: Internal Medicine

## 2018-10-02 DIAGNOSIS — Z87448 Personal history of other diseases of urinary system: Secondary | ICD-10-CM

## 2018-10-02 DIAGNOSIS — Z87440 Personal history of urinary (tract) infections: Secondary | ICD-10-CM | POA: Diagnosis not present

## 2018-10-02 DIAGNOSIS — R300 Dysuria: Secondary | ICD-10-CM

## 2018-10-02 DIAGNOSIS — R399 Unspecified symptoms and signs involving the genitourinary system: Secondary | ICD-10-CM

## 2018-10-02 LAB — POC URINALSYSI DIPSTICK (AUTOMATED)
Bilirubin, UA: NEGATIVE
Blood, UA: NEGATIVE
Glucose, UA: NEGATIVE
Ketones, UA: NEGATIVE
Leukocytes, UA: NEGATIVE
Nitrite, UA: NEGATIVE
Protein, UA: NEGATIVE
Spec Grav, UA: 1.025 (ref 1.010–1.025)
Urobilinogen, UA: 0.2 E.U./dL
pH, UA: 6 (ref 5.0–8.0)

## 2018-10-02 NOTE — Telephone Encounter (Signed)
Pt scheduled for virtual visit 

## 2018-10-02 NOTE — Progress Notes (Signed)
   Virtual Visit via Telephone Note  I connected with@ on 10/02/18 at 10:30 AM EDT by telephone and verified that I am speaking with the correct person using two identifiers. limitations, risks, security and privacy concerns of performing an evaluation and management service by telephone and the availability of in person appointments. I also discussed with the patient that there may be a patient responsible charge related to this service. The patient expressed understanding and agreed to proceed.  Location patient: home Location provider: work  office Participants present for the call: patient, provider Patient did not have a visit in the prior 7 days to address this/these issue(s).   History of Present Illness: [name] presents for phone visit    Having off and on sx for about 2 weeks  Some pinching with urination and not feeling as well that is not every day  But  Since we are keeping touch on uti possible  Contacting us   .  Dr Jeffie Pollock is also on the team  No fever vomiting flank pain  abd pain at this time    Observations/Objective: Patient sounds cheerful and well on the phone. I do not appreciate any SOB. Speech and thought processing are grossly intact. Patient reported vitals: UA poct  Nl today  Cur cx pending   Assessment and Plan: Lower urinary tract symptoms (LUTS) - Plan: POCT Urinalysis Dipstick (Automated)  History of acute pyelonephritis  Dysuria - Plan: Culture, Urine, POCT Urinalysis Dipstick (Automated)  Hx of sig uti  luts   R/o infection ua good today  But at risk   Follow Up Instructions:  @ urine culture pending  And call if sx inc in interim   Hx of serious uti in past seems t o be stabilized  After adding estrogen cream per dr Jeffie Pollock who is also available   Plan   Let us know if worse  Or fever etc otherwise await  UC    [id number] 5-10 [id number] 11-20 9443 21-30 I did not refer this patient for an OV in the next 24 hours for this/these issue(s).  I  discussed the assessment and treatment plan with the patient. The patient was provided an opportunity to ask questions and all were answered. The patient agreed with the plan and demonstrated an understanding of the instructions. And plans    The patient was advised to call back or seek an in-person evaluation if the symptoms worsen or if the condition fails to improve as anticipated. After discussion   I provided 13 minutes of non-face-to-face time during this encounter.   [name], MD

## 2018-10-05 ENCOUNTER — Other Ambulatory Visit: Payer: Self-pay | Admitting: Internal Medicine

## 2018-10-05 LAB — URINE CULTURE
MICRO NUMBER:: 466504
SPECIMEN QUALITY:: ADEQUATE

## 2018-10-05 MED ORDER — AMOXICILLIN 500 MG PO CAPS: 500.0000 mg | ORAL_CAPSULE | Freq: Three times a day (TID) | ORAL | 0 refills | Status: DC

## 2018-10-22 ENCOUNTER — Ambulatory Visit: Payer: Medicare Other | Admitting: Family Medicine

## 2018-10-22 ENCOUNTER — Telehealth: Payer: Self-pay | Admitting: *Deleted

## 2018-10-22 ENCOUNTER — Other Ambulatory Visit: Payer: Self-pay

## 2018-10-22 ENCOUNTER — Encounter: Payer: Self-pay | Admitting: Family Medicine

## 2018-10-22 DIAGNOSIS — R6889 Other general symptoms and signs: Secondary | ICD-10-CM

## 2018-10-22 DIAGNOSIS — N390 Urinary tract infection, site not specified: Secondary | ICD-10-CM | POA: Diagnosis not present

## 2018-10-22 MED ORDER — NITROFURANTOIN MONOHYD MACRO 100 MG PO CAPS: 100.0000 mg | ORAL_CAPSULE | Freq: Two times a day (BID) | ORAL | 0 refills | Status: DC

## 2018-10-22 NOTE — Telephone Encounter (Signed)
Pt had telephone appt at 11:15a today. Pt stated she missed the call and would like to be put back on Dr. Barbie Banner schedule. Please advise.

## 2018-10-22 NOTE — Progress Notes (Signed)
   Subjective:    Patient ID: [name], female    DOB: 14-Mar-1941, 78 y.o.   MRN: [id number]  HPI Virtual Visit via Telephone Note  I connected with the patient on 10/22/18 at  3:15 PM EDT by telephone and verified that I am speaking with the correct person using two identifiers. We attempted to connect virtually but we had technical difficulties with the audio and video.    I discussed the limitations, risks, security and privacy concerns of performing an evaluation and management service by telephone and the availability of in person appointments. I also discussed with the patient that there may be a patient responsible charge related to this service. The patient expressed understanding and agreed to proceed.  Location patient: home Location provider: work or home office Participants present for the call: patient, provider Patient did not have a visit in the prior 7 days to address this/these issue(s).   History of Present Illness: Here for several issues. 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 She saw Dr. Regis Bill on 10-02-18 for urinary frequency, urgency, and burning, and a urine culture grew Enterococcus. She was treated with 5 days of Amoxicillin and she felt better but never fully well. Then 3 days ago the same symptoms flared up again. No fever or nausea. She drinks plenty of water. She also asks about antibody testing for the Covid-19 virus. This past December she travelled to [place] to visit a relative, and she flew through [place], [place] both ways to get there. Her husband accompanied her. They both became ill at that time for about 10 days, and they both had chills, body aches, fevers, fatigue, and a loss of taste and smell. No chest pain or SOB. Her husband had the same symptoms, and they think they could have been infected with the Covid virus.    Observations/Objective: Patient sounds cheerful and well on the phone. I do not appreciate any SOB. Speech and  thought processing are grossly intact. Patient reported vitals:  Assessment and Plan: Possible prior infection with the Covid-19 virus. She has recovered at this point. We will bring her in for antibody testing soon. Also she likely has a partially treated UTI, so she will take Macrobid for 7 days.  Alysia Penna, MD   Follow Up Instructions:     [phone] 5-10 [phone] 11-20 [phone] 21-30 I did not refer this patient for an OV in the next 24 hours for this/these issue(s).  I discussed the assessment and treatment plan with the patient. The patient was provided an opportunity to ask questions and all were answered. The patient agreed with the plan and demonstrated an understanding of the instructions.   The patient was advised to call back or seek an in-person evaluation if the symptoms worsen or if the condition fails to improve as anticipated.  I provided 16 minutes of non-face-to-face time during this encounter.   Alysia Penna, MD    Review of Systems  Constitutional: Positive for fatigue. Negative for fever.  Respiratory: Negative.   Cardiovascular: Negative.   Gastrointestinal: Negative.        Objective:   Physical Exam        Assessment & Plan:

## 2018-10-22 NOTE — Telephone Encounter (Signed)
Pt has appt with dr.fry

## 2018-10-22 NOTE — Telephone Encounter (Signed)
Called and spoke with pt and she is aware of appt at 315 today via telephone.

## 2018-10-22 NOTE — Telephone Encounter (Signed)
Copied from [place] [id number]. Topic: General - Other >> Oct 22, 2018  9:51 AM Burchel, Abbi R wrote: Pt requesting a call back re: ongoing bladder infection.  Please call pt:  [phone].

## 2018-10-23 DIAGNOSIS — R6889 Other general symptoms and signs: Secondary | ICD-10-CM | POA: Diagnosis not present

## 2018-10-23 NOTE — Addendum Note (Signed)
Addended by: Gwynne Edinger on: 10/23/2018 01:19 PM   Modules accepted: Orders

## 2018-10-24 ENCOUNTER — Encounter: Payer: Self-pay | Admitting: *Deleted

## 2018-10-24 DIAGNOSIS — R6889 Other general symptoms and signs: Secondary | ICD-10-CM

## 2018-10-24 DIAGNOSIS — N390 Urinary tract infection, site not specified: Secondary | ICD-10-CM

## 2018-10-24 LAB — SAR COV2 SEROLOGY (COVID19)AB(IGG),IA: SARS CoV2 AB IGG: NEGATIVE

## 2018-10-30 NOTE — Telephone Encounter (Signed)
So  The antibody serology   testing  Is not as  Sensitive and specific as  One would think.  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  There is not also not enough information that all antibodies found  Confer immunity at this time.   I had a patient with confirmed clinical   Infection that recovered and still had negative antibodies !.   Suspect a clearer picture  Will come in the future .  That being said  If you still wish to proceed  Then  [facility] can order the sars coV 2 ABy testing   For you.

## 2018-11-13 ENCOUNTER — Telehealth: Payer: Self-pay | Admitting: *Deleted

## 2018-11-13 DIAGNOSIS — M5431 Sciatica, right side: Secondary | ICD-10-CM

## 2018-11-13 NOTE — Telephone Encounter (Signed)
Copied from [place] [id number]. Topic: Referral - Request for Referral >> Nov 13, 2018 12:39 PM Rainey Pines A wrote: Has patient seen PCP for this complaint? {Yes *If NO, is insurance requiring patient see PCP for this issue before PCP can refer them? Referral for which specialty: Orthopedics Preferred provider/office: Dr .Ernestina Patches [phone] Reason for referral: sciatica

## 2018-11-14 NOTE — Telephone Encounter (Signed)
Please do referral  As patient requests  I agree

## 2018-11-16 ENCOUNTER — Encounter: Payer: Self-pay | Admitting: Internal Medicine

## 2018-11-16 ENCOUNTER — Ambulatory Visit (INDEPENDENT_AMBULATORY_CARE_PROVIDER_SITE_OTHER): Payer: Medicare Other | Admitting: Internal Medicine

## 2018-11-16 ENCOUNTER — Other Ambulatory Visit: Payer: Self-pay

## 2018-11-16 DIAGNOSIS — M79652 Pain in left thigh: Secondary | ICD-10-CM

## 2018-11-16 DIAGNOSIS — M545 Low back pain, unspecified: Secondary | ICD-10-CM

## 2018-11-16 DIAGNOSIS — Z8781 Personal history of (healed) traumatic fracture: Secondary | ICD-10-CM

## 2018-11-16 NOTE — Telephone Encounter (Signed)
Referral has been placed. 

## 2018-11-16 NOTE — Progress Notes (Signed)
There is no work phone number on file.   Virtual Visit via Telephone Note  I connected with@ on 11/16/18 at 11:30 AM EDT by telephone and verified that I am speaking with the correct person using two identifiers.   I discussed the limitations, risks, security and privacy concerns of performing an evaluation and management service by telephone and the availability of in person appointments.  Location patient: home Location provider: work e office Participants present for the call: patient, provider Patient did not have a visit in the prior 7 days to address this/these issue(s).   History of Present Illness: Phone visit  regarding    About ortho  referral   And evaluation.  She has had  Sciatica sx down r leg for a while and then recently   Left buttock and ant med thigh pain  ? Cause  Leg may feel like given out  Pain worse with down stairs    Not sure if weak no recent fall .   Right arm shoulder  Under axilla tender  And sometimes to back  No swelling  Never finished full PT after shoulder rx  But ok   taking Excedrin and ibuprofen at times to help with pain    Observations/Objective: Patient sounds cheerful and well on the phone. I do not appreciate any SOB. Speech and thought processing are grossly intact. Patient reported vitals:  Assessment and Plan:   ICD-10-CM   1. Left thigh pain  M79.652   2. Low back pain radiating to right leg  M54.5   3. History of fracture acromia clavicular  Z87.81    consider  Right hip pathology vs  Back  Origin  Newer sx   suspect arm pain may be related to prev fx  Have ortho see  In addition to  Thigh groin pain  ? Is would be helped by PT.   Follow Up Instructions: Has appt dr Ninfa Linden on Monday June 29th    [id number] 5-10 [id number] 11-20 9443 21-30 I did not refer this patient for an OV in the next 24 hours for this/these issue(s).  I discussed the assessment and treatment plan with the patient. The patient was provided an opportunity to ask  questions and all were answered. The patient agreed with the plan and demonstrated an understanding of the instructions.   The patient was advised to call back or seek an in-person evaluation if the symptoms worsen or if the condition fails to improve as anticipated.  I provided 10 minutes of non-face-to-face time during this encounter.   [name], MD

## 2018-11-19 ENCOUNTER — Ambulatory Visit (INDEPENDENT_AMBULATORY_CARE_PROVIDER_SITE_OTHER): Payer: Medicare Other | Admitting: Orthopaedic Surgery

## 2018-11-19 ENCOUNTER — Other Ambulatory Visit: Payer: Self-pay

## 2018-11-19 ENCOUNTER — Ambulatory Visit: Payer: Self-pay

## 2018-11-19 ENCOUNTER — Encounter: Payer: Self-pay | Admitting: Orthopaedic Surgery

## 2018-11-19 DIAGNOSIS — M5442 Lumbago with sciatica, left side: Secondary | ICD-10-CM | POA: Diagnosis not present

## 2018-11-19 DIAGNOSIS — M5441 Lumbago with sciatica, right side: Secondary | ICD-10-CM | POA: Diagnosis not present

## 2018-11-19 DIAGNOSIS — M79605 Pain in left leg: Secondary | ICD-10-CM | POA: Diagnosis not present

## 2018-11-19 DIAGNOSIS — M79604 Pain in right leg: Secondary | ICD-10-CM

## 2018-11-19 DIAGNOSIS — M4807 Spinal stenosis, lumbosacral region: Secondary | ICD-10-CM

## 2018-11-19 MED ORDER — METHYLPREDNISOLONE 4 MG PO TABS: ORAL_TABLET | ORAL | 0 refills | Status: DC

## 2018-11-19 MED ORDER — METHOCARBAMOL 500 MG PO TABS: 500.0000 mg | ORAL_TABLET | Freq: Four times a day (QID) | ORAL | 1 refills | Status: DC | PRN

## 2018-11-19 MED ORDER — TRAMADOL HCL 50 MG PO TABS: 50.0000 mg | ORAL_TABLET | Freq: Four times a day (QID) | ORAL | 0 refills | Status: DC | PRN

## 2018-11-19 NOTE — Progress Notes (Signed)
Office Visit Note   Patient: [name]           Date of Birth: Jun 01, 1940           MRN: [id number] Visit Date: 11/19/2018              Requested by: [name], MD [place],  [place] [address] PCP: [name], MD   Assessment & Plan: Visit Diagnoses:  1. Bilateral leg pain   2. Acute bilateral low back pain with bilateral sciatica     Plan: I am going to put her on a 6-day steroid taper combined with tramadol and Robaxin.  I have also recommended Voltaren gel for her pain just below the axillary area on the right.  I would like obtain an MRI of the lumbar spine to rule out a herniated disc and nerve compression given her profound sciatic symptoms and her positive straight leg raise bilaterally.  All question concerns were answered addressed.  We will see her back after the MRI.  Follow-Up Instructions: Return in about 2 weeks (around 12/03/2018).   Orders:  Orders Placed This Encounter  Procedures   XR HIPS BILAT W OR W/O PELVIS 2V   XR Lumbar Spine 2-3 Views   Meds ordered this encounter  Medications   methylPREDNISolone (MEDROL) 4 MG tablet    Sig: Medrol dose pack. Take as instructed    Dispense:  21 tablet    Refill:  0   traMADol (ULTRAM) 50 MG tablet    Sig: Take 1-2 tablets (50-100 mg total) by mouth every 6 (six) hours as needed.    Dispense:  40 tablet    Refill:  0   methocarbamol (ROBAXIN) 500 MG tablet    Sig: Take 1 tablet (500 mg total) by mouth every 6 (six) hours as needed for muscle spasms.    Dispense:  60 tablet    Refill:  1      Procedures: No procedures performed   Clinical Data: No additional findings.   Subjective: Chief Complaint  Patient presents with   Right Leg - Pain  The patient comes in with a one-month history of worsening low back pain with bilateral leg pain and sciatica symptoms with pain radiating down from her issue area where she points to behind her knee and into the top  of her feet on both sides.  She is had numbness and tingling as well.  She is not a diabetic and not a smoker.  She also reports right-sided rib pain is been going on for over a year now it radiates from thoracic spine around her back that is uncomfortable at night especially when her husband puts her arm around her her she has compression against this area some.  Sometimes it hurts unbearably when she stands.  She denies any change in bowel bladder function.  She tried some anti-inflammatories to try to get things to calm down but is certainly getting worse for her in terms of her sciatic symptoms bilaterally.  She has had sciatica in the past.  She has worked on activity modification and rest as well as anti-inflammatories, she is tried ice and heat.  HPI  Review of Systems She currently denies any headache, chest pain, shortness of breath, fever, chills, nausea, vomiting  Objective: Vital Signs: There were no vitals taken for this visit.  Physical Exam She is alert and orient x3 and in no acute distress Ortho Exam Examination of her  lower extremity shows a significantly positive straight leg raise to the right and left sides.  She has subjective numbness in the L4 and L5 distribution bilaterally.  She has good strength in her bilateral lower extremities.  Her hip exam and knee exam bilaterally is normal.  She has pain to palpation along thoracic spine and the rib area under her right axilla.  There is no rash in this area. Specialty Comments:  No specialty comments available.  Imaging: Xr Hips Bilat W Or W/o Pelvis 2v  Result Date: 11/19/2018 An AP pelvis and bilateral hip x-rays showed no acute findings.  The hip joint space  Xr Lumbar Spine 2-3 Views  Result Date: 11/19/2018 2 views of the lumbar spine show no acute findings.  The disc spaces    PMFS History: Patient Active Problem List   Diagnosis Date Noted   Colitis 11/27/2017   Diarrhea 11/27/2017   Closed head injury  11/27/2015   Unilateral headache 05/26/2015   ARF (acute renal failure) ([place]) 03/12/2015   Gallstones 03/12/2015   Nausea without vomiting 12/10/2014   Essential hypertension 05/29/2014   Post-traumatic headache 09/19/2013   Abdominal pain 05/29/2013   Agent resistant to multiple antibiotics 05/29/2013   Low back pain radiating to right leg 01/16/2013   Sinus problem 11/20/2012   Chronic headaches morning 10/19/2012   Low sodium levels  133 10/19/2012   Leg pain, posterior 08/16/2012   Sciatic neuritis 08/16/2012   Sleep disturbance, unspecified 08/16/2012   Medication management 03/24/2012   Preventative health care 09/24/2011   Trigeminal neuralgia 09/24/2011   Postmenopausal HRT (hormone replacement therapy) 09/24/2011   Polycythemia 09/20/2011   Morning headache 02/01/2011   Hearing aid worn    Abnormal blood finding    LOCALIZED SUPERFICIAL SWELLING MASS OR LUMP 09/21/2009   DYSPNEA 05/07/2008   Hypnic headache 03/19/2008   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     Closed head injury 5-6 yrs ago   Complication of anesthesia    migraine several hours after general anesthesia   Fatty liver    Gall stones 2016   see ct scan neg [facility]    GERD (gastroesophageal reflux disease)    Hearing aid worn    HOH (hard of hearing)    both ears   Hyperlipidemia    Hypertension    echo nl lv function  mild dilitation 2009   Kidney infection    few yrs ago in hospital   Medication side effect  09/02/2010   Poss muscle se of 10 crestor    Migraine    hypnic HA eval by Dr. 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    Pyelonephritis 03/12/2015   Sensation of pain in anesthetized distribution of trigeminal nerve    Syncope 02/01/2011   In shower on vacation  sustained head laceration  8 sutures Had ed visit neg head ct labs and x ray    Trigeminal neuralgia pain     Family History  Problem  Relation Age of Onset   Ovarian cancer Mother    Stroke Mother    Alcohol abuse Father    Stroke Father    Diabetes Brother    Cancer Paternal Aunt        leukemia, unknown type   Seizures Daughter    Hypertension Other    Colon cancer Neg Hx     Past Surgical History:  Procedure Laterality Date   ABDOMINAL HYSTERECTOMY  2002   tubal   CARDIAC CATHETERIZATION  2000   chest pains neg   CHOLECYSTECTOMY N/A 02/21/2017   Procedure: LAPAROSCOPIC CHOLECYSTECTOMY WITH INTRAOPERATIVE CHOLANGIOGRAM;  Surgeon: Armandina Gemma, MD;  Location: [facility];  Service: General;  Laterality: N/A;   COLONOSCOPY     multiple   CRANIOTOMY  12/09/2011   nerve decompression right trigeminal    DOPPLER ECHOCARDIOGRAPHY  2009   nl lv function mild lv dilitation   EYE SURGERY Bilateral    ioc for catatracts   laparoscopic gallbladder surgery  02/16/2017   Fax from [place] Bilateral 2002   rt shoulder surgery     Social History   Occupational History    Comment: retired Forensic psychologist  Tobacco Use   Smoking status: Never Smoker   Smokeless tobacco: Never Used  Substance and Sexual Activity   Alcohol use: Yes    Alcohol/week: 2.0 standard drinks    Types: 2 Glasses of wine per week    Comment: occ wine   Drug use: No   Sexual activity: Not on file

## 2018-11-26 ENCOUNTER — Ambulatory Visit: Payer: Medicare Other | Admitting: Internal Medicine

## 2018-11-26 ENCOUNTER — Ambulatory Visit (INDEPENDENT_AMBULATORY_CARE_PROVIDER_SITE_OTHER): Payer: Medicare Other | Admitting: Internal Medicine

## 2018-11-26 ENCOUNTER — Telehealth: Payer: Self-pay | Admitting: Orthopaedic Surgery

## 2018-11-26 ENCOUNTER — Other Ambulatory Visit: Payer: Medicare Other

## 2018-11-26 ENCOUNTER — Other Ambulatory Visit: Payer: Self-pay

## 2018-11-26 ENCOUNTER — Ambulatory Visit (INDEPENDENT_AMBULATORY_CARE_PROVIDER_SITE_OTHER): Payer: Medicare Other

## 2018-11-26 ENCOUNTER — Encounter: Payer: Self-pay | Admitting: Internal Medicine

## 2018-11-26 VITALS — BP 118/66 | HR 75 | Temp 98.5°F | Wt 144.4 lb

## 2018-11-26 DIAGNOSIS — Z8781 Personal history of (healed) traumatic fracture: Secondary | ICD-10-CM | POA: Diagnosis not present

## 2018-11-26 DIAGNOSIS — M8580 Other specified disorders of bone density and structure, unspecified site: Secondary | ICD-10-CM

## 2018-11-26 DIAGNOSIS — R079 Chest pain, unspecified: Secondary | ICD-10-CM

## 2018-11-26 DIAGNOSIS — Z79899 Other long term (current) drug therapy: Secondary | ICD-10-CM

## 2018-11-26 MED ORDER — ALENDRONATE SODIUM 35 MG PO TABS: 35.0000 mg | ORAL_TABLET | ORAL | 6 refills | Status: DC

## 2018-11-26 MED ORDER — ACETAMINOPHEN-CODEINE #3 300-30 MG PO TABS: 1.0000 | ORAL_TABLET | Freq: Three times a day (TID) | ORAL | 0 refills | Status: DC | PRN

## 2018-11-26 NOTE — Telephone Encounter (Signed)
She would like to try Tylenol #3

## 2018-11-26 NOTE — Telephone Encounter (Signed)
Pt called in left vm stating that she is in a lot of pain and her medication that was given to her isn't working so she started to take tylenol and Motrin but its causing a lot of problems with her stomach.  [phone]

## 2018-11-26 NOTE — Telephone Encounter (Signed)
I had put her on tramadol, Robaxin and a 6-day steroid taper.  I know we are awaiting the results of an MRI of her spine.  There is not another medication that I can put her on other than some Tylenol No. 3 if she would like that.

## 2018-11-26 NOTE — Telephone Encounter (Signed)
patient called in wanting to know if she can get something else for pain because what she was prescribed is not working. Its affecting her everyday living skills.

## 2018-11-26 NOTE — Telephone Encounter (Signed)
Please advise 

## 2018-11-26 NOTE — Progress Notes (Signed)
Chief Complaint  Patient presents with  . Follow-up    wants to discuss bone density     HPI: [name] 78 y.o. come in for Chronic disease management  Dexa:  Right side chest wall pain  tedner when layin gon that side  And up to axilla    Began after her last surgery  Ortho said not relatded?  No assoc sx  Ongoing no sob cough related   Dealing with back and sciatica under care   To get mri soon . dexa scan review   ROS: See pertinent positives and negatives per HPI. No sob recnet fall new gi gu sx   Past Medical History:  Diagnosis Date  . ADJ DISORDER WITH MIXED ANXIETY & DEPRESSED MOOD 03/03/2010   Qualifier: Diagnosis of  By: [name], [name]   . Closed head injury 02/01/2011   from syncope and had scalp laceration  neg ct .    Marland Kitchen Closed head injury 5-6 yrs ago  . Complication of anesthesia    migraine several hours after general anesthesia  . Fatty liver   . Gall stones 2016   see ct scan neg HIDA   . GERD (gastroesophageal reflux disease)   . Hearing aid worn   . HOH (hard of hearing)    both ears  . Hyperlipidemia   . Hypertension    echo nl lv function  mild dilitation 2009  . Kidney infection    few yrs ago in hospital  . Medication side effect 09/02/2010   Poss muscle se of 10 crestor   . Migraine    hypnic HA eval by Dr. Earley Favor in the past  . Polycythemia   . 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 Pyelonephritis 03/12/2015  . 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 Syncope 02/01/2011   In shower on vacation  sustained head laceration  8 sutures Had ed visit neg head ct labs and x ray   . Trigeminal neuralgia pain     Family History  Problem Relation Age of Onset  . Ovarian cancer Mother   . Stroke Mother   . Alcohol abuse Father   . Stroke Father   . Diabetes Brother   . Cancer Paternal Aunt        leukemia, unknown type  . Seizures Daughter   . Hypertension Other   . Colon cancer Neg Hx     Social  History   Socioeconomic History  . Marital status: Married    Spouse name: Not on file  . Number of children: 2  . Years of education: Not on file  . Highest education level: Not on file  Occupational History    Comment: retired Forensic psychologist  Social Needs  . Financial resource strain: Not on file  . Food insecurity    Worry: Not on file    Inability: Not on file  . Transportation needs    Medical: Not on file    Non-medical: Not on file  Tobacco Use  . Smoking status: Never Smoker  . Smokeless tobacco: Never Used  Substance and Sexual Activity  . Alcohol use: Yes    Alcohol/week: 2.0 standard drinks    Types: 2 Glasses of wine per week    Comment: occ wine  . Drug use: No  . Sexual activity: Not on file  Lifestyle  . Physical activity    Days per week: Not on  file    Minutes per session: Not on file  . Stress: Not on file  Relationships  . Social Herbalist on phone: Not on file    Gets together: Not on file    Attends religious service: Not on file    Active member of club or organization: Not on file    Attends meetings of clubs or organizations: Not on file    Relationship status: Not on file  Other Topics Concern  . Not on file  Social History Narrative   Married   HH of 2-3 (god daughter)   Pets 2 dogs   Non smoker    Child is a physician   G2P2             Outpatient Medications Prior to Visit  Medication Sig Dispense Refill  . albuterol (PROVENTIL HFA;VENTOLIN HFA) 108 (90 Base) MCG/ACT inhaler Inhale 2 puffs into the lungs every 6 (six) hours as needed. 1 Inhaler 2  . amLODipine (NORVASC) 10 MG tablet Take 1 tablet by mouth once daily 90 tablet 0  . ASCOMP-CODEINE 50-325-40-30 MG capsule TAKE 1-2 CAPSULES BY MOUTH EVERY FOUR TO SIX HOURS AS NEEDED FOR MIGRAINE MAX OF 6 CAPSULES IN 24 HOURS 30 capsule 0  . benazepril (LOTENSIN) 20 MG tablet Take 1 tablet by mouth once daily 90 tablet 0  . budesonide-formoterol (SYMBICORT) 160-4.5  MCG/ACT inhaler Inhale 2 puffs into the lungs 2 (two) times daily. (Patient taking differently: Inhale 2 puffs into the lungs 2 (two) times daily as needed. ) 1 Inhaler 2  . chlorthalidone (HYGROTON) 25 MG tablet Take 1 tablet by mouth once daily 90 tablet 0  . conjugated estrogens (PREMARIN) vaginal cream Place 1 Applicatorful vaginally 2 (two) times a week.    . docusate sodium (COLACE) 250 MG capsule Take 1 capsule (250 mg total) by mouth daily. While taking pain medications 10 capsule 0  . fluticasone (FLONASE) 50 MCG/ACT nasal spray Place 2 sprays into both nostrils as needed for allergies or rhinitis.     . methocarbamol (ROBAXIN) 500 MG tablet Take 1 tablet (500 mg total) by mouth every 6 (six) hours as needed for muscle spasms. 60 tablet 1  . methylPREDNISolone (MEDROL) 4 MG tablet Medrol dose pack. Take as instructed 21 tablet 0  . nitrofurantoin, macrocrystal-monohydrate, (MACROBID) 100 MG capsule Take 1 capsule (100 mg total) by mouth 2 (two) times daily. 14 capsule 0  . RESTASIS 0.05 % ophthalmic emulsion Place 1 drop into both eyes 2 (two) times daily.   1  . carbamazepine (TEGRETOL) 200 MG tablet Take 2 tablets by mouth twice daily 360 tablet 0  . traMADol (ULTRAM) 50 MG tablet Take 1-2 tablets (50-100 mg total) by mouth every 6 (six) hours as needed. 40 tablet 0   No facility-administered medications prior to visit.      EXAM:  BP 118/66 (BP Location: Right Arm, Patient Position: Sitting, Cuff Size: Normal)   Pulse 75   Temp 98.5 F (36.9 C) (Oral)   Wt 144 lb 6.4 oz (65.5 kg)   SpO2 95%   BMI 23.88 kg/m   Body mass index is 23.88 kg/m.  GENERAL: vitals reviewed and listed above, alert, oriented, appears well hydrated and in no acute distress HEENT: atraumatic, conjunctiva  clear, no obvious abnormalities on inspection of external nose and ears  NECK: no obvious masses on inspection palpation  LUNGS: clear to auscultation bilaterally, no wheezes, rales or rhonchi, good  air movement  No deformity  No specific  Tender point but pper right chest wall area of discomfort  CV: HRRR, no clubbing cyanosis or  peripheral edema nl cap refill  MS: moves all extremities without noticeable focal  abnormality PSYCH: pleasant and cooperative, no obvious depression or anxiety  BP Readings from Last 3 Encounters:  11/26/18 118/66  05/25/18 134/82  01/24/18 130/72   Results:  Lumbar spine L1-L4 Femoral neck (FN) 33% distal radius  T-score -0.9 RFN: -1.3 LFN: -2.0 n/a  Change in BMD from previous DXA test (%) n/a n/a n/a  (*) statistically significant  Assessment: the BMD is low according to the [facility] classification for osteoporosis (see below). Fracture risk: moderate FRAX score: 10 year major osteoporotic risk: 21.4%. 10 year hip fracture risk: 6.0%. The thresholds for treatment are 20% and 3%, respectively. Wt Readings from Last 3 Encounters:  11/26/18 144 lb 6.4 oz (65.5 kg)  05/25/18 143 lb 3.2 oz (65 kg)  01/24/18 140 lb (63.5 kg)    ASSESSMENT AND PLAN:  Discussed the following assessment and plan:   ICD-10-CM   1. Osteopenia with high risk of fracture  M85.80    see dexa neg fam hx options disc plan dexa  in 2 years   2. Right-sided chest pain  R07.9 DG Chest 2 View  3. History of fracture  Z87.81   4. Medication management  Z79.899      disc poss bisphosphonate and  prolia etc   rx given can decide to help on this   Ok to try  Plan dexa in 2 years   I still suspect  The her right chest wall tenderness latera anterior without point tenderness could be in someway related to trauma nd she    Related to after surgery  But no abdominal . Check c xray    Total visit 61mns > 50% spent counseling and coordinating care as indicated in above note and in instructions to patient .   -Patient advised to return or notify health care team  if  new concerns arise.  Patient Instructions  Take Vit D3 1000 - 2000 iu per day   For bone health.   weight bearing  exercise  And resistances training   consider  Using  Medication also  Bisphosphpnate    Fosamax.  And risedronate.  Injectable is prolia  . Plan  repeat dexa in 2 years.   Not sure cause of  you have  Right side chest pain   You should have a updated chest x ray .     Bone Health Bones protect organs, store calcium, anchor muscles, and support the whole body. Keeping your bones strong is important, especially as you get older. You can take actions to help keep your bones strong and healthy. Why is keeping my bones healthy important?  Keeping your bones healthy is important because your body constantly replaces bone cells. Cells get old, and new cells take their place. As we age, we lose bone cells because the body may not be able to make enough new cells to replace the old cells. The amount of bone cells and bone tissue you have is referred to as bone mass. The higher your bone mass, the stronger your bones. The aging process leads to an overall loss of bone mass in the body, which can increase the likelihood of:  Joint pain and stiffness.  Broken bones.  A condition in which the bones become weak and brittle (osteoporosis). A large decline  in bone mass occurs in older adults. In women, it occurs about the time of menopause. What actions can I take to keep my bones healthy? Good health habits are important for maintaining healthy bones. This includes eating nutritious foods and exercising regularly. To have healthy bones, you need to get enough of the right minerals and vitamins. Most nutrition experts recommend getting these nutrients from the foods that you eat. In some cases, taking supplements may also be recommended. Doing certain types of exercise is also important for bone health. What are the nutritional recommendations for healthy bones?  Eating a well-balanced diet with plenty of calcium and vitamin D will help to protect your bones. Nutritional recommendations vary from  person to person. Ask your health care provider what is healthy for you. Here are some general guidelines. Get enough calcium Calcium is the most important (essential) mineral for bone health. Most people can get enough calcium from their diet, but supplements may be recommended for people who are at risk for osteoporosis. Good sources of calcium include:  Dairy products, such as low-fat or nonfat milk, cheese, and yogurt.  Dark green leafy vegetables, such as bok choy and broccoli.  Calcium-fortified foods, such as orange juice, cereal, bread, soy beverages, and tofu products.  Nuts, such as almonds. Follow these recommended amounts for daily calcium intake:  Children, age 84-3: 700 mg.  Children, age 33-8: 1,000 mg.  Children, age 845-13: 1,300 mg.  Teens, age 337-18: 1,300 mg.  Adults, age 63-50: 1,000 mg.  Adults, age 23-70: ? Men: 1,000 mg. ? Women: 1,200 mg.  Adults, age 35 or older: 1,200 mg.  Pregnant and breastfeeding females: ? Teens: 1,300 mg. ? Adults: 1,000 mg. Get enough vitamin D Vitamin D is the most essential vitamin for bone health. It helps the body absorb calcium. Sunlight stimulates the skin to make vitamin D, so be sure to get enough sunlight. If you live in a cold climate or you do not get outside often, your health care provider may recommend that you take vitamin D supplements. Good sources of vitamin D in your diet include:  Egg yolks.  Saltwater fish.  Milk and cereal fortified with vitamin D. Follow these recommended amounts for daily vitamin D intake:  Children and teens, age 84-18: 600 international units.  Adults, age 332 or younger: 400-800 international units.  Adults, age 56 or older: 800-1,000 international units. Get other important nutrients Other nutrients that are important for bone health include:  Phosphorus. This mineral is found in meat, poultry, dairy foods, nuts, and legumes. The recommended daily intake for adult men and adult  women is 700 mg.  Magnesium. This mineral is found in seeds, nuts, dark green vegetables, and legumes. The recommended daily intake for adult men is 400-420 mg. For adult women, it is 310-320 mg.  Vitamin K. This vitamin is found in green leafy vegetables. The recommended daily intake is 120 mg for adult men and 90 mg for adult women. What type of physical activity is best for building and maintaining healthy bones? Weight-bearing and strength-building activities are important for building and maintaining healthy bones. Weight-bearing activities cause muscles and bones to work against gravity. Strength-building activities increase the strength of the muscles that support bones. Weight-bearing and muscle-building activities include:  Walking and hiking.  Jogging and running.  Dancing.  Gym exercises.  Lifting weights.  Tennis and racquetball.  Climbing stairs.  Aerobics. Adults should get at least 30 minutes of moderate physical activity on  most days. Children should get at least 60 minutes of moderate physical activity on most days. Ask your health care provider what type of exercise is best for you. How can I find out if my bone mass is low? Bone mass can be measured with an X-ray test called a bone mineral density (BMD) test. This test is recommended for all women who are age 28 or older. It may also be recommended for:  Men who are age 87 or older.  People who are at risk for osteoporosis because of: ? Having bones that break easily. ? Having a long-term disease that weakens bones, such as kidney disease or rheumatoid arthritis. ? Having menopause earlier than normal. ? Taking medicine that weakens bones, such as steroids, thyroid hormones, or hormone treatment for breast cancer or prostate cancer. ? Smoking. ? Drinking three or more alcoholic drinks a day. If you find that you have a low bone mass, you may be able to prevent osteoporosis or further bone loss by changing your  diet and lifestyle. Where can I find more information? For more information, check out the following websites:  [place]: AviationT[employer].fr  [employer] of Health: www.bones.SouthExposed.es  International Osteoporosis Foundation: Administrator.iofbonehealth.org Summary  The aging process leads to an overall loss of bone mass in the body, which can increase the likelihood of broken bones and osteoporosis.  Eating a well-balanced diet with plenty of calcium and vitamin D will help to protect your bones.  Weight-bearing and strength-building activities are also important for building and maintaining strong bones.  Bone mass can be measured with an X-ray test called a bone mineral density (BMD) test. This information is not intended to replace advice given to you by your health care provider. Make sure you discuss any questions you have with your health care provider. Document Released: 07/30/2003 Document Revised: 06/05/2017 Document Reviewed: 06/05/2017 Elsevier Patient Education  [place].   Alendronate tablets What is this medicine? ALENDRONATE (a LEN droe nate) slows calcium loss from bones. It helps to make normal healthy bone and to slow bone loss in people with Paget's disease and osteoporosis. It may be used in others at risk for bone loss. This medicine may be used for other purposes; ask your health care provider or pharmacist if you have questions. COMMON BRAND NAME(S): Fosamax What should I tell my health care provider before I take this medicine? They need to know if you have any of these conditions:  dental disease  esophagus, stomach, or intestine problems, like acid reflux or GERD  kidney disease  low blood calcium  low vitamin D  problems sitting or standing 30 minutes  trouble swallowing  an unusual or allergic reaction to alendronate, other medicines, foods, dyes, or preservatives  pregnant or trying to get pregnant   breast-feeding How should I use this medicine? You must take this medicine exactly as directed or you will lower the amount of the medicine you absorb into your body or you may cause yourself harm. Take this medicine by mouth first thing in the morning, after you are up for the day. Do not eat or drink anything before you take your medicine. Swallow the tablet with a full glass (6 to 8 fluid ounces) of plain water. Do not take this medicine with any other drink. Do not chew or crush the tablet. After taking this medicine, do not eat breakfast, drink, or take any medicines or vitamins for at least 30 minutes. Sit or stand up  for at least 30 minutes after you take this medicine; do not lie down. Do not take your medicine more often than directed. Talk to your pediatrician regarding the use of this medicine in children. Special care may be needed. Overdosage: If you think you have taken too much of this medicine contact a poison control center or emergency room at once. NOTE: This medicine is only for you. Do not share this medicine with others. What if I miss a dose? If you miss a dose, do not take it later in the day. Continue your normal schedule starting the next morning. Do not take double or extra doses. What may interact with this medicine?  aluminum hydroxide  antacids  aspirin  calcium supplements  drugs for inflammation like ibuprofen, naproxen, and others  iron supplements  magnesium supplements  vitamins with minerals This list may not describe all possible interactions. Give your health care provider a list of all the medicines, herbs, non-prescription drugs, or dietary supplements you use. Also tell them if you smoke, drink alcohol, or use illegal drugs. Some items may interact with your medicine. What should I watch for while using this medicine? Visit your doctor or health care professional for regular checks ups. It may be some time before you see benefit from this medicine.  Do not stop taking your medicine except on your doctor's advice. Your doctor or health care professional may order blood tests and other tests to see how you are doing. You should make sure you get enough calcium and vitamin D while you are taking this medicine, unless your doctor tells you not to. Discuss the foods you eat and the vitamins you take with your health care professional. Some people who take this medicine have severe bone, joint, and/or muscle pain. This medicine may also increase your risk for a broken thigh bone. Tell your doctor right away if you have pain in your upper leg or groin. Tell your doctor if you have any pain that does not go away or that gets worse. This medicine can make you more sensitive to the sun. If you get a rash while taking this medicine, sunlight may cause the rash to get worse. Keep out of the sun. If you cannot avoid being in the sun, wear protective clothing and use sunscreen. Do not use sun lamps or tanning beds/booths. What side effects may I notice from receiving this medicine? Side effects that you should report to your doctor or health care professional as soon as possible:  allergic reactions like skin rash, itching or hives, swelling of the face, lips, or tongue  black or tarry stools  bone, muscle or joint pain  changes in vision  chest pain  heartburn or stomach pain  jaw pain, especially after dental work  pain or trouble when swallowing  redness, blistering, peeling or loosening of the skin, including inside the mouth Side effects that usually do not require medical attention (report to your doctor or health care professional if they continue or are bothersome):  changes in taste  diarrhea or constipation  eye pain or itching  headache  nausea or vomiting  stomach gas or fullness This list may not describe all possible side effects. Call your doctor for medical advice about side effects. You may report side effects to FDA at  1-800-FDA-1088. Where should I keep my medicine? Keep out of the reach of children. Store at room temperature of 15 and 30 degrees C (59 and 86 degrees F). Throw  away any unused medicine after the expiration date. NOTE: This sheet is a summary. It may not cover all possible information. If you have questions about this medicine, talk to your doctor, pharmacist, or health care provider.  2020 Elsevier/Gold Standard (2010-11-05 08:56:09)       [name]. [name] M.D.

## 2018-11-26 NOTE — Patient Instructions (Addendum)
Take Vit D3 1000 - 2000 iu per day   For bone health.   weight bearing exercise  And resistances training   consider  Using  Medication also  Bisphosphpnate    Fosamax.  And risedronate.  Injectable is prolia  . Plan  repeat dexa in 2 years.   Not sure cause of  you have  Right side chest pain   You should have a updated chest x ray .     Bone Health Bones protect organs, store calcium, anchor muscles, and support the whole body. Keeping your bones strong is important, especially as you get older. You can take actions to help keep your bones strong and healthy. Why is keeping my bones healthy important?  Keeping your bones healthy is important because your body constantly replaces bone cells. Cells get old, and new cells take their place. As we age, we lose bone cells because the body may not be able to make enough new cells to replace the old cells. The amount of bone cells and bone tissue you have is referred to as bone mass. The higher your bone mass, the stronger your bones. The aging process leads to an overall loss of bone mass in the body, which can increase the likelihood of:  Joint pain and stiffness.  Broken bones.  A condition in which the bones become weak and brittle (osteoporosis). A large decline in bone mass occurs in older adults. In women, it occurs about the time of menopause. What actions can I take to keep my bones healthy? Good health habits are important for maintaining healthy bones. This includes eating nutritious foods and exercising regularly. To have healthy bones, you need to get enough of the right minerals and vitamins. Most nutrition experts recommend getting these nutrients from the foods that you eat. In some cases, taking supplements may also be recommended. Doing certain types of exercise is also important for bone health. What are the nutritional recommendations for healthy bones?  Eating a well-balanced diet with plenty of calcium and vitamin D  will help to protect your bones. Nutritional recommendations vary from person to person. Ask your health care provider what is healthy for you. Here are some general guidelines. Get enough calcium Calcium is the most important (essential) mineral for bone health. Most people can get enough calcium from their diet, but supplements may be recommended for people who are at risk for osteoporosis. Good sources of calcium include:  Dairy products, such as low-fat or nonfat milk, cheese, and yogurt.  Dark green leafy vegetables, such as bok choy and broccoli.  Calcium-fortified foods, such as orange juice, cereal, bread, soy beverages, and tofu products.  Nuts, such as almonds. Follow these recommended amounts for daily calcium intake:  Children, age 27-3: 700 mg.  Children, age 37-8: 1,000 mg.  Children, age 57-13: 1,300 mg.  Teens, age 81-18: 1,300 mg.  Adults, age 34-50: 1,000 mg.  Adults, age 53-70: ? Men: 1,000 mg. ? Women: 1,200 mg.  Adults, age 22 or older: 1,200 mg.  Pregnant and breastfeeding females: ? Teens: 1,300 mg. ? Adults: 1,000 mg. Get enough vitamin D Vitamin D is the most essential vitamin for bone health. It helps the body absorb calcium. Sunlight stimulates the skin to make vitamin D, so be sure to get enough sunlight. If you live in a cold climate or you do not get outside often, your health care provider may recommend that you take vitamin D supplements. Good sources of vitamin D  in your diet include:  Egg yolks.  Saltwater fish.  Milk and cereal fortified with vitamin D. Follow these recommended amounts for daily vitamin D intake:  Children and teens, age 29-18: 600 international units.  Adults, age 91 or younger: 400-800 international units.  Adults, age 29 or older: 800-1,000 international units. Get other important nutrients Other nutrients that are important for bone health include:  Phosphorus. This mineral is found in meat, poultry, dairy foods,  nuts, and legumes. The recommended daily intake for adult men and adult women is 700 mg.  Magnesium. This mineral is found in seeds, nuts, dark green vegetables, and legumes. The recommended daily intake for adult men is 400-420 mg. For adult women, it is 310-320 mg.  Vitamin K. This vitamin is found in green leafy vegetables. The recommended daily intake is 120 mg for adult men and 90 mg for adult women. What type of physical activity is best for building and maintaining healthy bones? Weight-bearing and strength-building activities are important for building and maintaining healthy bones. Weight-bearing activities cause muscles and bones to work against gravity. Strength-building activities increase the strength of the muscles that support bones. Weight-bearing and muscle-building activities include:  Walking and hiking.  Jogging and running.  Dancing.  Gym exercises.  Lifting weights.  Tennis and racquetball.  Climbing stairs.  Aerobics. Adults should get at least 30 minutes of moderate physical activity on most days. Children should get at least 60 minutes of moderate physical activity on most days. Ask your health care provider what type of exercise is best for you. How can I find out if my bone mass is low? Bone mass can be measured with an X-ray test called a bone mineral density (BMD) test. This test is recommended for all women who are age 291 or older. It may also be recommended for:  Men who are age 34 or older.  People who are at risk for osteoporosis because of: ? Having bones that break easily. ? Having a long-term disease that weakens bones, such as kidney disease or rheumatoid arthritis. ? Having menopause earlier than normal. ? Taking medicine that weakens bones, such as steroids, thyroid hormones, or hormone treatment for breast cancer or prostate cancer. ? Smoking. ? Drinking three or more alcoholic drinks a day. If you find that you have a low bone mass, you may  be able to prevent osteoporosis or further bone loss by changing your diet and lifestyle. Where can I find more information? For more information, check out the following websites:  [place]: AviationTales.fr  [employer] of Health: www.bones.SouthExposed.es  International Osteoporosis Foundation: Administrator.iofbonehealth.org Summary  The aging process leads to an overall loss of bone mass in the body, which can increase the likelihood of broken bones and osteoporosis.  Eating a well-balanced diet with plenty of calcium and vitamin D will help to protect your bones.  Weight-bearing and strength-building activities are also important for building and maintaining strong bones.  Bone mass can be measured with an X-ray test called a bone mineral density (BMD) test. This information is not intended to replace advice given to you by your health care provider. Make sure you discuss any questions you have with your health care provider. Document Released: 07/30/2003 Document Revised: 06/05/2017 Document Reviewed: 06/05/2017 Elsevier Patient Education  [place].   Alendronate tablets What is this medicine? ALENDRONATE (a LEN droe nate) slows calcium loss from bones. It helps to make normal healthy bone and to slow bone loss  in people with Paget's disease and osteoporosis. It may be used in others at risk for bone loss. This medicine may be used for other purposes; ask your health care provider or pharmacist if you have questions. COMMON BRAND NAME(S): Fosamax What should I tell my health care provider before I take this medicine? They need to know if you have any of these conditions:  dental disease  esophagus, stomach, or intestine problems, like acid reflux or GERD  kidney disease  low blood calcium  low vitamin D  problems sitting or standing 30 minutes  trouble swallowing  an unusual or allergic reaction to alendronate, other medicines, foods,  dyes, or preservatives  pregnant or trying to get pregnant  breast-feeding How should I use this medicine? You must take this medicine exactly as directed or you will lower the amount of the medicine you absorb into your body or you may cause yourself harm. Take this medicine by mouth first thing in the morning, after you are up for the day. Do not eat or drink anything before you take your medicine. Swallow the tablet with a full glass (6 to 8 fluid ounces) of plain water. Do not take this medicine with any other drink. Do not chew or crush the tablet. After taking this medicine, do not eat breakfast, drink, or take any medicines or vitamins for at least 30 minutes. Sit or stand up for at least 30 minutes after you take this medicine; do not lie down. Do not take your medicine more often than directed. Talk to your pediatrician regarding the use of this medicine in children. Special care may be needed. Overdosage: If you think you have taken too much of this medicine contact a poison control center or emergency room at once. NOTE: This medicine is only for you. Do not share this medicine with others. What if I miss a dose? If you miss a dose, do not take it later in the day. Continue your normal schedule starting the next morning. Do not take double or extra doses. What may interact with this medicine?  aluminum hydroxide  antacids  aspirin  calcium supplements  drugs for inflammation like ibuprofen, naproxen, and others  iron supplements  magnesium supplements  vitamins with minerals This list may not describe all possible interactions. Give your health care provider a list of all the medicines, herbs, non-prescription drugs, or dietary supplements you use. Also tell them if you smoke, drink alcohol, or use illegal drugs. Some items may interact with your medicine. What should I watch for while using this medicine? Visit your doctor or health care professional for regular checks ups.  It may be some time before you see benefit from this medicine. Do not stop taking your medicine except on your doctor's advice. Your doctor or health care professional may order blood tests and other tests to see how you are doing. You should make sure you get enough calcium and vitamin D while you are taking this medicine, unless your doctor tells you not to. Discuss the foods you eat and the vitamins you take with your health care professional. Some people who take this medicine have severe bone, joint, and/or muscle pain. This medicine may also increase your risk for a broken thigh bone. Tell your doctor right away if you have pain in your upper leg or groin. Tell your doctor if you have any pain that does not go away or that gets worse. This medicine can make you more sensitive to the  sun. If you get a rash while taking this medicine, sunlight may cause the rash to get worse. Keep out of the sun. If you cannot avoid being in the sun, wear protective clothing and use sunscreen. Do not use sun lamps or tanning beds/booths. What side effects may I notice from receiving this medicine? Side effects that you should report to your doctor or health care professional as soon as possible:  allergic reactions like skin rash, itching or hives, swelling of the face, lips, or tongue  black or tarry stools  bone, muscle or joint pain  changes in vision  chest pain  heartburn or stomach pain  jaw pain, especially after dental work  pain or trouble when swallowing  redness, blistering, peeling or loosening of the skin, including inside the mouth Side effects that usually do not require medical attention (report to your doctor or health care professional if they continue or are bothersome):  changes in taste  diarrhea or constipation  eye pain or itching  headache  nausea or vomiting  stomach gas or fullness This list may not describe all possible side effects. Call your doctor for medical  advice about side effects. You may report side effects to FDA at 1-800-FDA-1088. Where should I keep my medicine? Keep out of the reach of children. Store at room temperature of 15 and 30 degrees C (59 and 86 degrees F). Throw away any unused medicine after the expiration date. NOTE: This sheet is a summary. It may not cover all possible information. If you have questions about this medicine, talk to your doctor, pharmacist, or health care provider.  2020 Elsevier/Gold Standard (2010-11-05 08:56:09)

## 2018-11-27 ENCOUNTER — Encounter: Payer: Self-pay | Admitting: Orthopaedic Surgery

## 2018-11-29 DIAGNOSIS — H16212 Exposure keratoconjunctivitis, left eye: Secondary | ICD-10-CM | POA: Diagnosis not present

## 2018-11-29 DIAGNOSIS — H16143 Punctate keratitis, bilateral: Secondary | ICD-10-CM | POA: Diagnosis not present

## 2018-12-03 ENCOUNTER — Ambulatory Visit: Payer: Medicare Other | Admitting: Orthopaedic Surgery

## 2018-12-04 MED ORDER — GABAPENTIN 300 MG PO CAPS: 300.0000 mg | ORAL_CAPSULE | Freq: Two times a day (BID) | ORAL | 1 refills | Status: DC

## 2018-12-05 ENCOUNTER — Encounter: Payer: Self-pay | Admitting: Internal Medicine

## 2018-12-05 ENCOUNTER — Telehealth: Payer: Self-pay | Admitting: Orthopedic Surgery

## 2018-12-05 ENCOUNTER — Encounter: Payer: Self-pay | Admitting: Orthopaedic Surgery

## 2018-12-05 ENCOUNTER — Ambulatory Visit (INDEPENDENT_AMBULATORY_CARE_PROVIDER_SITE_OTHER): Payer: Medicare Other | Admitting: Internal Medicine

## 2018-12-05 DIAGNOSIS — Z79899 Other long term (current) drug therapy: Secondary | ICD-10-CM

## 2018-12-05 DIAGNOSIS — R079 Chest pain, unspecified: Secondary | ICD-10-CM | POA: Diagnosis not present

## 2018-12-05 NOTE — Telephone Encounter (Signed)
Called into pharmacy

## 2018-12-05 NOTE — Telephone Encounter (Signed)
[facility] states that they cannot accept the refill request for patient's Tylenol #3, they need a new Rx.  What they received from our office did not have the doctor's DEA # listed.

## 2018-12-05 NOTE — Progress Notes (Signed)
   Virtual Visit via Telephone Note  I connected with@ on 12/05/18 at  2:30 PM EDT by telephone and verified that I am speaking with the correct person using two identifiers.   I discussed the limitations, risks, security and privacy concerns of performing an evaluation and management service by telephone and the availability of in person appointments. I also discussed with the patient that there may be a patient responsible charge related to this service. The patient awaree and proceed with visit   Location patient: home Location provider: workoffice Participants present for the call: patient, provider Patient did not have a visit in the prior 7 days to address this/these issue(s).   History of Present Illness: [name]  See my chart messages  Still having pain sensation and can go to mid upper back  Area  When pressure on area  "where arm  Attached comes out of chest"    Dec bra to dec pressure and pain  . In interim ls spine eval  Had se of tramadol cns so stopped and now on t #3 2 po bid mostly   Asks if should begin the gabapentin or wait .   see  My char messages  Observations/Objective: Patient sounds cheerful and well on the phone. I do not appreciate any SOB. Speech and thought processing are grossly intact. Patient reported vitals:  Assessment and Plan:   Follow Up Instructions: Decided to  Wait on taking gabapentin using t #3 2 bid now for pain and risk  Of meds combo may be problematic  And risking SE  To have ls spine mri eval  And after eval and plan then may look more closely at the right arm axillar area pain . I still suspect neuro sensory pain from injury  [facility] after wearing bra and will go without) consideration of  Ct chest if needed and even pain evaluation  If not obvious help.  Get back with [place] after plan  And management of ls spine.   [id number] [id number] 11-20 [id number] 21-30 I did not refer this patient for an OV in the next 24 hours for  this/these issue(s).  I discussed the assessment and treatment plan with the patient. The patient was provided an opportunity to ask questions and all were answered. The patient agreed with the plan and demonstrated an understanding of the instructions.   The patient was advised to call back or seek an in-person evaluation if the symptoms worsen or if the condition fails to improve as anticipated.  I provided 18  minutes of non-face-to-face time during this encounter.   [name], MD

## 2018-12-06 ENCOUNTER — Other Ambulatory Visit: Payer: Self-pay | Admitting: Orthopaedic Surgery

## 2018-12-06 MED ORDER — ACETAMINOPHEN-CODEINE #3 300-30 MG PO TABS: 1.0000 | ORAL_TABLET | Freq: Three times a day (TID) | ORAL | 0 refills | Status: DC | PRN

## 2018-12-07 MED ORDER — CARBAMAZEPINE 200 MG PO TABS: 400.0000 mg | ORAL_TABLET | Freq: Two times a day (BID) | ORAL | 0 refills | Status: DC

## 2018-12-11 ENCOUNTER — Encounter: Payer: Self-pay | Admitting: Internal Medicine

## 2018-12-11 ENCOUNTER — Other Ambulatory Visit: Payer: Self-pay | Admitting: Orthopaedic Surgery

## 2018-12-11 NOTE — Telephone Encounter (Signed)
Please advise 

## 2018-12-12 ENCOUNTER — Ambulatory Visit
Admission: RE | Admit: 2018-12-12 | Discharge: 2018-12-12 | Disposition: A | Discharge status: Home / Self Care | Payer: Medicare Other | Source: Ambulatory Visit | Attending: Orthopaedic Surgery | Admitting: Orthopaedic Surgery

## 2018-12-12 ENCOUNTER — Other Ambulatory Visit: Payer: Self-pay

## 2018-12-12 DIAGNOSIS — M4807 Spinal stenosis, lumbosacral region: Secondary | ICD-10-CM

## 2018-12-17 ENCOUNTER — Encounter: Payer: Self-pay | Admitting: Orthopaedic Surgery

## 2018-12-17 ENCOUNTER — Ambulatory Visit (INDEPENDENT_AMBULATORY_CARE_PROVIDER_SITE_OTHER): Payer: Medicare Other | Admitting: Orthopaedic Surgery

## 2018-12-17 ENCOUNTER — Other Ambulatory Visit: Payer: Self-pay

## 2018-12-17 ENCOUNTER — Other Ambulatory Visit: Payer: Self-pay | Admitting: Orthopaedic Surgery

## 2018-12-17 ENCOUNTER — Telehealth: Payer: Self-pay | Admitting: Orthopaedic Surgery

## 2018-12-17 DIAGNOSIS — G8929 Other chronic pain: Secondary | ICD-10-CM

## 2018-12-17 DIAGNOSIS — M545 Low back pain, unspecified: Secondary | ICD-10-CM

## 2018-12-17 DIAGNOSIS — M4807 Spinal stenosis, lumbosacral region: Secondary | ICD-10-CM

## 2018-12-17 MED ORDER — ACETAMINOPHEN-CODEINE #3 300-30 MG PO TABS: ORAL_TABLET | ORAL | 0 refills | Status: DC

## 2018-12-17 NOTE — Telephone Encounter (Signed)
Please advise 

## 2018-12-17 NOTE — Progress Notes (Signed)
The patient is coming in today to go over an MRI of her lumbar spine.  She was having significant low back pain with worsening radicular symptoms going down her right leg.  This is mainly on the lateral aspect of her right leg into her foot.  She also has low back pain as well.  We have her on a muscle relaxant and Tylenol 3.  She reports that her symptoms are manageable on these medications but she wants to be able to be off the medications.  She is actually heading to the beach tomorrow.  The MRI was reviewed by the radiologist and compared to an MRI of 2016.  The MRI is reviewed with her.  We did pull up the images as well as give her a copy of the MRI report.  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  This is a combination of a worsening disc bulge/herniation combined with facet joint arthritis.  It is easily seen on the MRI.  On exam she does have a positive straight leg raise on the right side and subjective numbness on the lateral aspect of her right leg but good strength in her legs that is equal bilaterally.  She is interested in a spinal injection that we will set up with Dr. Ernestina Patches.  She actually says her husband is a patient of Dr. Ernestina Patches as well and she would like to consider an injection to the right side at the L4-L5 level.  We will work on getting that scheduled.  She will continue her current medications.  I will see her back myself in about 4 weeks.  All question concerns were answered and addressed.

## 2018-12-17 NOTE — Telephone Encounter (Signed)
See below

## 2018-12-17 NOTE — Telephone Encounter (Signed)
Patient called to state she is going on vacation and will run out of her medication. Wanting to know if this can be called in by tomorrow 12/18/18 before she leaves.  Tylenol 3 is what she was taking.  Please call patient as she has called pharmacy too.  [phone]

## 2018-12-21 ENCOUNTER — Telehealth: Payer: Self-pay | Admitting: Orthopaedic Surgery

## 2018-12-21 NOTE — Telephone Encounter (Signed)
Patient called advised she is very upset thinking that she will run out of her medication. Patient asked for  a call back as soon as possible.Please see previous note. The number to contact patient is [phone]

## 2018-12-21 NOTE — Telephone Encounter (Signed)
Patient aware this was called in for her on the 27th

## 2018-12-24 MED ORDER — AMLODIPINE BESYLATE 10 MG PO TABS: 10.0000 mg | ORAL_TABLET | Freq: Every day | ORAL | 0 refills | Status: DC

## 2018-12-28 ENCOUNTER — Other Ambulatory Visit: Payer: Self-pay | Admitting: Orthopaedic Surgery

## 2018-12-28 NOTE — Telephone Encounter (Signed)
I called to pharmacy.

## 2018-12-28 NOTE — Telephone Encounter (Signed)
Ok to rf? 

## 2018-12-28 NOTE — Telephone Encounter (Signed)
Can you please advise?

## 2018-12-28 NOTE — Telephone Encounter (Signed)
Ok to refill 

## 2018-12-31 NOTE — Telephone Encounter (Signed)
Called into pharmacy

## 2019-01-01 ENCOUNTER — Encounter: Payer: Self-pay | Admitting: Physical Medicine and Rehabilitation

## 2019-01-01 ENCOUNTER — Ambulatory Visit: Payer: Self-pay

## 2019-01-01 NOTE — Telephone Encounter (Signed)
So  She should stop the medicine  And then let us know if feels better

## 2019-01-01 NOTE — Telephone Encounter (Signed)
Please see note.

## 2019-01-01 NOTE — Telephone Encounter (Signed)
Incoming call from Patient with complaint of having lower abdominal pain after taking gabapentin 364mg. Also Reports dizziness and Trouble with eyesight.  Was instructed to make Dr. PRegis [place]. Patient would like a return call back         Answer Assessment - Initial Assessment Questions 1.   NAME of MEDICATION: "What medicine are you calling about?"     gab 2.   QUESTION: "What is your question?"     *No Answer* 3.   PRESCRIBING HCP: "Who prescribed it?" Reason: if prescribed by specialist, call should be referred to that group.     *No Answer* 4. SYMPTOMS: "Do you have any symptoms?"    Eyesight , abdominal pain 5. SEVERITY: If symptoms are present, ask "Are they mild, moderate or severe?"     Mild  statrted 2 weeks ago,  REGNANCY:  "Is there any chance that you are pregnant?" "When was your last menstrual period?"     na  Protocols used: MEDICATION QUESTION CALL-A-AH

## 2019-01-01 NOTE — Telephone Encounter (Signed)
Dr. Regis Bill, please see message and advise.

## 2019-01-02 NOTE — Telephone Encounter (Signed)
lvm for pt to call back.  

## 2019-01-03 ENCOUNTER — Encounter: Payer: Self-pay | Admitting: Physical Medicine and Rehabilitation

## 2019-01-03 ENCOUNTER — Ambulatory Visit (INDEPENDENT_AMBULATORY_CARE_PROVIDER_SITE_OTHER): Payer: Medicare Other | Admitting: Physical Medicine and Rehabilitation

## 2019-01-03 ENCOUNTER — Ambulatory Visit: Payer: Self-pay

## 2019-01-03 VITALS — BP 121/70 | HR 72

## 2019-01-03 DIAGNOSIS — M5416 Radiculopathy, lumbar region: Secondary | ICD-10-CM | POA: Diagnosis not present

## 2019-01-03 DIAGNOSIS — M48062 Spinal stenosis, lumbar region with neurogenic claudication: Secondary | ICD-10-CM | POA: Diagnosis not present

## 2019-01-03 MED ORDER — BETAMETHASONE SOD PHOS & ACET 6 (3-3) MG/ML IJ SUSP
12.0000 mg | Freq: Once | INTRAMUSCULAR | Status: AC
  Administered 2019-01-03: 12 mg

## 2019-01-03 NOTE — Procedures (Signed)
Lumbosacral Transforaminal Epidural Steroid Injection - Sub-Pedicular Approach with Fluoroscopic Guidance  Patient: [name]      Date of Birth: [birth year] MRN: [id number] PCP: [name], MD      Visit Date: 01/03/2019   Universal Protocol:    Date/Time: 01/03/2019  Consent Given By: the patient  Position: PRONE  Additional Comments: Vital signs were monitored before and after the procedure. Patient was prepped and draped in the usual sterile fashion. The correct patient, procedure, and site was verified.   Injection Procedure Details:  Procedure Site One Meds Administered:  Meds ordered this encounter  Medications  . betamethasone acetate-betamethasone sodium phosphate (CELESTONE) injection 12 mg    Laterality: Bilateral  Location/Site:  L4-L5  Needle size: 22 G  Needle type: Spinal  Needle Placement: Transforaminal  Findings:    -Comments: Excellent flow of contrast along the nerve and into the epidural space.  Procedure Details: After squaring off the end-plates to get a true AP view, the C-arm was positioned so that an oblique view of the foramen as noted above was visualized. The target area is just inferior to the "nose of the scotty dog" or sub pedicular. The soft tissues overlying this structure were infiltrated with 2-3 ml. of 1% Lidocaine without Epinephrine.  The spinal needle was inserted toward the target using a "trajectory" view along the fluoroscope beam.  Under AP and lateral visualization, the needle was advanced so it did not puncture dura and was located close the 6 O'Clock position of the pedical in AP tracterory. Biplanar projections were used to confirm position. Aspiration was confirmed to be negative for CSF and/or blood. A 1-2 ml. volume of Isovue-250 was injected and flow of contrast was noted at each level. Radiographs were obtained for documentation purposes.   After attaining the desired flow of contrast documented  above, a 0.5 to 1.0 ml test dose of 0.25% Marcaine was injected into each respective transforaminal space.  The patient was observed for 90 seconds post injection.  After no sensory deficits were reported, and normal lower extremity motor function was noted,   the above injectate was administered so that equal amounts of the injectate were placed at each foramen (level) into the transforaminal epidural space.   Additional Comments:  The patient tolerated the procedure well Dressing: 2 x 2 sterile gauze and Band-Aid    Post-procedure details: Patient was observed during the procedure. Post-procedure instructions were reviewed.  Patient left the clinic in stable condition.

## 2019-01-03 NOTE — Progress Notes (Signed)
.  Numeric Pain Rating Scale and Functional Assessment Average Pain 7   In the last MONTH (on 0-10 scale) has pain interfered with the following?  1. General activity like being  able to carry out your everyday physical activities such as walking, climbing stairs, carrying groceries, or moving a chair?  Rating(8)   +Driver, -BT, -Dye Allergies.

## 2019-01-10 ENCOUNTER — Other Ambulatory Visit: Payer: Self-pay | Admitting: Orthopaedic Surgery

## 2019-01-10 NOTE — Telephone Encounter (Signed)
Please advise 

## 2019-01-14 ENCOUNTER — Ambulatory Visit (INDEPENDENT_AMBULATORY_CARE_PROVIDER_SITE_OTHER): Payer: Medicare Other | Admitting: Orthopaedic Surgery

## 2019-01-14 ENCOUNTER — Encounter: Payer: Self-pay | Admitting: Orthopaedic Surgery

## 2019-01-14 DIAGNOSIS — M4807 Spinal stenosis, lumbosacral region: Secondary | ICD-10-CM | POA: Diagnosis not present

## 2019-01-14 NOTE — Progress Notes (Signed)
The patient comes in today after having bilateral L4-L5 transforaminal injections by Dr. Ernestina Patches.  This was done a week and a half ago.  She still having some pain and she states she can tell there is been some help from the injection.  She states herself that she is inpatient and was hoping she would do better.  She is ambulating without assistive device and gets out of a chair easily.  She has a positive straight leg raise on the right but not on the left.  Overall though she does look better to me and seems to be mobilizing better.  She is 78 years old and is not interested in a referral to a Licensed conveyancer.  She did say that Dr. Ernestina Patches indicated that she could call their section of the office back for repeat injection if needed down the road.  She has excellent strength in her bilateral extremities on exam as well.  From my standpoint she will follow-up as needed.  She understands that she can call Dr. Romona Curls card the office directly for considering repeat injections if needed.  All question concerns were answered and addressed.

## 2019-01-14 NOTE — Telephone Encounter (Signed)
Could try   lyrica     And I could send in a low dose  And  We could increase if tolerated  And needed .   This is considered a controlled  Medication but not a narcotic it is a derivative of the gabapentin  Used fur nerve pain .  Let us know  which pharmacy

## 2019-01-15 MED ORDER — PREGABALIN 50 MG PO CAPS: 50.0000 mg | ORAL_CAPSULE | Freq: Two times a day (BID) | ORAL | 1 refills | Status: DC

## 2019-01-15 NOTE — Telephone Encounter (Signed)
Pt states she wants sent to [facility] on [address]

## 2019-01-17 ENCOUNTER — Encounter: Payer: Self-pay | Admitting: Physical Medicine and Rehabilitation

## 2019-01-18 NOTE — Telephone Encounter (Signed)
Can try pepcid instead of ranitiidine    But may not be strong enough   So on days you take  And nsaid  Would take  omeprezole ( otc prilosec  ) but if continuing problem amy have to stop those meds    Again try the OTC patches lidoace and voltaren gel in the are of concern   To see if  Can  Help   ( low risk of side effects )

## 2019-01-22 NOTE — Progress Notes (Signed)
Virtual Visit via Video Note  I connected with@ on 01/23/19 at  9:30 AM EDT by a video enabled telemedicine application her software couldn't connect today  ? Needed updated so she asked for a telephone visit   and verified that I am speaking with the correct person using two identifiers. Location patient: home Location provider:work  office Persons participating in the virtual phone  visit: patient, provider  [place]th national recommendations  regarding COVID 19 pandemic   video visit is advised over in office visit for this patient.  Patient aware  of the limitations of evaluation and management by telemedicine and  availability of in person appointments. and agreed to proceed.   HPI: [name] presents for telephone viist  Last firday August 28  Fell on cement  pool deck helping her dog greyhound    Who panicked after getting out of poll and  Made her fall over when she was trying to guide dog out of pool . She was trying to not fall on right sde where she has sciatica and hx of right shoulder injury   . Hurt / swelled right away but didn't think was broken possible sprain used ice  And now swelling and bruising is down can use finger  Still with some  Pain  Asking for a dvice   No prev injury to that wrist     ROS: See pertinent positives and negatives per HPI.  Past Medical History:  Diagnosis Date  . ADJ DISORDER WITH MIXED ANXIETY & DEPRESSED MOOD 03/03/2010   Qualifier: Diagnosis of  By: [name], [name]   . Closed head injury 02/01/2011   from syncope and had scalp laceration  neg ct .    Marland Kitchen Closed head injury 5-6 yrs ago  . Complication of anesthesia    migraine several hours after general anesthesia  . Fatty liver   . Gall stones 2016   see ct scan neg HIDA   . GERD (gastroesophageal reflux disease)   . Hearing aid worn   . HOH (hard of hearing)    both ears  . Hyperlipidemia   . Hypertension    echo nl lv function  mild dilitation 2009  . Kidney  infection    few yrs ago in hospital  . Medication side effect 09/02/2010   Poss muscle se of 10 crestor   . Migraine    hypnic HA eval by Dr. Earley Favor in the past  . Polycythemia   . 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 Pyelonephritis 03/12/2015  . 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 Syncope 02/01/2011   In shower on vacation  sustained head laceration  8 sutures Had ed visit neg head ct labs and x ray   . Trigeminal neuralgia pain     Past Surgical History:  Procedure Laterality Date  . ABDOMINAL HYSTERECTOMY  2002   tubal  . CARDIAC CATHETERIZATION  2000   chest pains neg  . CHOLECYSTECTOMY N/A 02/21/2017   Procedure: LAPAROSCOPIC CHOLECYSTECTOMY WITH INTRAOPERATIVE CHOLANGIOGRAM;  Surgeon: Armandina Gemma, MD;  Location: [facility];  Service: General;  Laterality: N/A;  . COLONOSCOPY     multiple  . CRANIOTOMY  12/09/2011   nerve decompression right trigeminal   . DOPPLER ECHOCARDIOGRAPHY  2009   nl lv function mild lv dilitation  . EYE SURGERY Bilateral    ioc for catatracts  . laparoscopic gallbladder surgery  02/16/2017   Fax  from [facility]  . OOPHORECTOMY Bilateral 2002  . rt shoulder surgery      Family History  Problem Relation Age of Onset  . Ovarian cancer Mother   . Stroke Mother   . Alcohol abuse Father   . Stroke Father   . Diabetes Brother   . Cancer Paternal Aunt        leukemia, unknown type  . Seizures Daughter   . Hypertension Other   . Colon cancer Neg Hx     Social History   Tobacco Use  . Smoking status: Never Smoker  . Smokeless tobacco: Never Used  Substance Use Topics  . Alcohol use: Yes    Alcohol/week: 2.0 standard drinks    Types: 2 Glasses of wine per week    Comment: occ wine  . Drug use: No      Current Outpatient Medications:  .  acetaminophen-codeine (TYLENOL #3) 300-30 MG tablet, take 1 to 2 tablets by mouth every 8 hours as needed for moderate pain, Disp: 40 tablet, Rfl: 0 .   albuterol (PROVENTIL HFA;VENTOLIN HFA) 108 (90 Base) MCG/ACT inhaler, Inhale 2 puffs into the lungs every 6 (six) hours as needed., Disp: 1 Inhaler, Rfl: 2 .  alendronate (FOSAMAX) 35 MG tablet, Take 1 tablet (35 mg total) by mouth every 7 (seven) days. Take with a full glass of water on an empty stomach., Disp: 4 tablet, Rfl: 6 .  amLODipine (NORVASC) 10 MG tablet, Take 1 tablet (10 mg total) by mouth daily., Disp: 90 tablet, Rfl: 0 .  ASCOMP-CODEINE 50-325-40-30 MG capsule, TAKE 1-2 CAPSULES BY MOUTH EVERY FOUR TO SIX HOURS AS NEEDED FOR MIGRAINE MAX OF 6 CAPSULES IN 24 HOURS, Disp: 30 capsule, Rfl: 0 .  benazepril (LOTENSIN) 20 MG tablet, Take 1 tablet by mouth once daily, Disp: 90 tablet, Rfl: 0 .  budesonide-formoterol (SYMBICORT) 160-4.5 MCG/ACT inhaler, Inhale 2 puffs into the lungs 2 (two) times daily. (Patient taking differently: Inhale 2 puffs into the lungs 2 (two) times daily as needed. ), Disp: 1 Inhaler, Rfl: 2 .  carbamazepine (TEGRETOL) 200 MG tablet, Take 2 tablets (400 mg total) by mouth 2 (two) times daily., Disp: 360 tablet, Rfl: 0 .  chlorthalidone (HYGROTON) 25 MG tablet, Take 1 tablet by mouth once daily, Disp: 90 tablet, Rfl: 0 .  conjugated estrogens (PREMARIN) vaginal cream, Place 1 Applicatorful vaginally 2 (two) times a week., Disp: , Rfl:  .  docusate sodium (COLACE) 250 MG capsule, Take 1 capsule (250 mg total) by mouth daily. While taking pain medications, Disp: 10 capsule, Rfl: 0 .  fluticasone (FLONASE) 50 MCG/ACT nasal spray, Place 2 sprays into both nostrils as needed for allergies or rhinitis. , Disp: , Rfl:  .  gabapentin (NEURONTIN) 300 MG capsule, Take 1 capsule (300 mg total) by mouth 2 (two) times daily. For pain, Disp: 60 capsule, Rfl: 1 .  methocarbamol (ROBAXIN) 500 MG tablet, Take 1 tablet (500 mg total) by mouth every 6 (six) hours as needed for muscle spasms., Disp: 60 tablet, Rfl: 1 .  methylPREDNISolone (MEDROL) 4 MG tablet, Medrol dose pack. Take as  instructed, Disp: 21 tablet, Rfl: 0 .  nitrofurantoin, macrocrystal-monohydrate, (MACROBID) 100 MG capsule, Take 1 capsule (100 mg total) by mouth 2 (two) times daily., Disp: 14 capsule, Rfl: 0 .  pregabalin (LYRICA) 50 MG capsule, Take 1 capsule (50 mg total) by mouth 2 (two) times daily. For neuropathic pain, Disp: 60 capsule, Rfl: 1 .  RESTASIS 0.05 %  ophthalmic emulsion, Place 1 drop into both eyes 2 (two) times daily. , Disp: , Rfl: 1  EXAM: BP Readings from Last 3 Encounters:  01/03/19 121/70  11/26/18 118/66  05/25/18 134/82      GENERAL: alert, oriented, appears well and in no acute distress appears normal speech for her and conversation  PSYCH/NEURO: pleasant and cooperative, no obvious depression or anxiety, speech and thought processing grossly intact   ASSESSMENT AND PLAN:  Discussed the following assessment and plan:    ICD-10-CM   1. Left wrist injury, initial encounter  S69.92XA DG Wrist Complete Left  2. Wrist pain, acute, left  M25.532 DG Wrist Complete Left  3. Fall in home, initial encounter  W19.XXXA DG Wrist Complete Left   Y92.009    Hx of falls this one freaky pet related    By hx disc no such thing as a sprain until r/o fracture   And cannot examine today .  Will need exam in follow up.   Get x ray and  If neg spint and plan in person visit here or with ortho in a week . If abnormal then plan ortho referral   Counseled.  Take pix      Disc  Fall  Hx of falls at risk    Expectant management and discussion of plan and treatment with opportunity to ask questions and all were answered. The patient agreed with the plan and demonstrated an understanding of the instructions.   Advised to call back or seek an in-person evaluation if worsening  or having  further concerns .  I provided 11 minutes of non-face-to-face time during this encounter.   [name], MD

## 2019-01-23 ENCOUNTER — Ambulatory Visit (INDEPENDENT_AMBULATORY_CARE_PROVIDER_SITE_OTHER)
Admission: RE | Admit: 2019-01-23 | Discharge: 2019-01-23 | Disposition: A | Discharge status: Home / Self Care | Payer: Medicare Other | Source: Ambulatory Visit | Attending: Internal Medicine | Admitting: Internal Medicine

## 2019-01-23 ENCOUNTER — Telehealth (INDEPENDENT_AMBULATORY_CARE_PROVIDER_SITE_OTHER): Payer: Medicare Other | Admitting: Internal Medicine

## 2019-01-23 ENCOUNTER — Encounter: Payer: Self-pay | Admitting: Internal Medicine

## 2019-01-23 ENCOUNTER — Other Ambulatory Visit: Payer: Self-pay

## 2019-01-23 DIAGNOSIS — M25532 Pain in left wrist: Secondary | ICD-10-CM

## 2019-01-23 DIAGNOSIS — S6992XA Unspecified injury of left wrist, hand and finger(s), initial encounter: Secondary | ICD-10-CM

## 2019-01-23 DIAGNOSIS — Y92009 Unspecified place in unspecified non-institutional (private) residence as the place of occurrence of the external cause: Secondary | ICD-10-CM

## 2019-01-23 DIAGNOSIS — W19XXXA Unspecified fall, initial encounter: Secondary | ICD-10-CM | POA: Diagnosis not present

## 2019-01-30 ENCOUNTER — Encounter: Payer: Self-pay | Admitting: Orthopaedic Surgery

## 2019-02-02 ENCOUNTER — Encounter: Payer: Self-pay | Admitting: Physical Medicine and Rehabilitation

## 2019-02-04 ENCOUNTER — Encounter: Payer: Self-pay | Admitting: Physician Assistant

## 2019-02-04 ENCOUNTER — Ambulatory Visit: Payer: Self-pay

## 2019-02-04 ENCOUNTER — Ambulatory Visit (INDEPENDENT_AMBULATORY_CARE_PROVIDER_SITE_OTHER): Payer: Medicare Other | Admitting: Physician Assistant

## 2019-02-04 VITALS — Ht 65.2 in | Wt 144.4 lb

## 2019-02-04 DIAGNOSIS — M25532 Pain in left wrist: Secondary | ICD-10-CM

## 2019-02-04 NOTE — Progress Notes (Signed)
Office Visit Note   Patient: [name]           Date of Birth: 06-10-1940           MRN: [id number] Visit Date: 02/04/2019              Requested by: [name], MD [place],  [place] [id number] PCP: [name], MD   Assessment & Plan: Visit Diagnoses:  1. Pain in left wrist     Plan: We will place her in a long-arm arm removable Velcro splint.  She will take this off only for hygiene purposes.  Otherwise she will wear it as if it is a cast.  We will see her back in 4 weeks at that time obtain 3 views of the left wrist.  Questions encouraged and answered.  Follow-Up Instructions: Return in about 4 weeks (around 03/04/2019) for Radiographs.   Orders:  Orders Placed This Encounter  Procedures  . XR Wrist Complete Left   No orders of the defined types were placed in this encounter.     Procedures: No procedures performed   Clinical Data: No additional findings.   Subjective: Chief Complaint  Patient presents with  . Left Wrist - Pain    HPI [name] comes in today with new complaint of left wrist pain is been going on for about 3 weeks.  She reports that she had a dog that was traveling at the pool and she had to pull the dog from pool and fell injuring her left wrist.  She notes she has considerable swelling of the hand and pain eventually went saw her primary care physician who ordered radiographs of the wrist.  Radiographs dated 01/23/2019 shows questionable nondisplaced fracture distal radius extending into the articular surface.  She was told to get a off-the-shelf wrist brace.  She has been using Voltaren gel on her wrist.  She continues to have it is a considerable amount of pain in the wrist. Notes that the brace that she has been wearing has been causing bruising on the volar side of her distal wrist. Review of Systems See HPI   Objective: Vital Signs: Ht 5' 5.2" (1.656 m)   Wt 144 lb 6.4 oz (65.5 kg)   BMI 23.88  kg/m   Physical Exam Constitutional:      Appearance: She is not ill-appearing or diaphoretic.  Pulmonary:     Effort: Pulmonary effort is normal.  Neurological:     Mental Status: She is alert and oriented to person, place, and time.  Psychiatric:        Behavior: Behavior normal.     Ortho Exam Left hand sensation grossly intact.  She has tenderness over the distal left radius.  No gross deformity.  Good range of motion the elbow without pain.  Specialty Comments:  No specialty comments available.  Imaging: Xr Wrist Complete Left  Result Date: 02/04/2019 Left wrist 3 views: AP view shows a nondisplaced distal radius fracture with considerable callus formation.  No intra-articular involvement appreciated.  Lateral and oblique view fracture is fairly evident.  No other bony abnormalities.    PMFS History: Patient Active Problem List   Diagnosis Date Noted  . Colitis 11/27/2017  . Diarrhea 11/27/2017  . Closed head injury 11/27/2015  . Unilateral headache 05/26/2015  . ARF (acute renal failure) ([place]) 03/12/2015  . Gallstones 03/12/2015  . Nausea without vomiting 12/10/2014  . Essential hypertension 05/29/2014  . 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  Abdominal pain 05/29/2013  . Agent resistant to multiple antibiotics 05/29/2013  . Low back pain radiating to right leg 01/16/2013  . Sinus problem 11/20/2012  . Chronic headaches morning 10/19/2012  . Low sodium levels  133 10/19/2012  . Leg pain, posterior 08/16/2012  . Sciatic neuritis 08/16/2012  . Sleep disturbance, unspecified 08/16/2012  . Medication management 03/24/2012  . Preventative health care 09/24/2011  . Trigeminal neuralgia 09/24/2011  . Postmenopausal HRT (hormone replacement therapy) 09/24/2011  . Polycythemia 09/20/2011  . Morning headache 02/01/2011  . Hearing aid worn   . Abnormal blood finding   . LOCALIZED SUPERFICIAL SWELLING MASS OR LUMP 09/21/2009  . DYSPNEA 05/07/2008  . Hypnic headache  03/19/2008  . 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 Headache(784.0) 06/05/2007  . JAW PAIN 04/06/2007  . OSTEOARTHRITIS 04/06/2007  . ABNORMAL RESULT, FUNCTION STUDY, [place] 02/05/2007  . HYPERLIPIDEMIA 12/25/2006  . HYPERTENSION 12/25/2006   Past Medical History:  Diagnosis Date  . ADJ DISORDER WITH MIXED ANXIETY & DEPRESSED MOOD 03/03/2010   Qualifier: Diagnosis of  By: [name], [name]   . Closed head injury 02/01/2011   from syncope and had scalp laceration  neg ct .    Marland Kitchen Closed head injury 5-6 yrs ago  . Complication of anesthesia    migraine several hours after general anesthesia  . Fatty liver   . Gall stones 2016   see ct scan neg HIDA   . GERD (gastroesophageal reflux disease)   . Hearing aid worn   . HOH (hard of hearing)    both ears  . Hyperlipidemia   . Hypertension    echo nl lv function  mild dilitation 2009  . Kidney infection    few yrs ago in hospital  . Medication side effect 09/02/2010   Poss muscle se of 10 crestor   . Migraine    hypnic HA eval by Dr. Earley Favor in the past  . Polycythemia   . 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 Pyelonephritis 03/12/2015  . 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 Syncope 02/01/2011   In shower on vacation  sustained head laceration  8 sutures Had ed visit neg head ct labs and x ray   . Trigeminal neuralgia pain     Family History  Problem Relation Age of Onset  . Ovarian cancer Mother   . Stroke Mother   . Alcohol abuse Father   . Stroke Father   . Diabetes Brother   . Cancer Paternal Aunt        leukemia, unknown type  . Seizures Daughter   . Hypertension Other   . Colon cancer Neg Hx     Past Surgical History:  Procedure Laterality Date  . ABDOMINAL HYSTERECTOMY  2002   tubal  . CARDIAC CATHETERIZATION  2000   chest pains neg  . CHOLECYSTECTOMY N/A 02/21/2017   Procedure: LAPAROSCOPIC CHOLECYSTECTOMY WITH INTRAOPERATIVE CHOLANGIOGRAM;  Surgeon: Armandina Gemma, MD;  Location: [facility];  Service: General;  Laterality: N/A;  . COLONOSCOPY     multiple  . CRANIOTOMY  12/09/2011   nerve decompression right trigeminal   . DOPPLER ECHOCARDIOGRAPHY  2009   nl lv function mild lv dilitation  . EYE SURGERY Bilateral    ioc for catatracts  . laparoscopic gallbladder surgery  02/16/2017   Fax from [facility]  . OOPHORECTOMY Bilateral 2002  . rt shoulder surgery     Social History  Occupational History    Comment: retired Forensic psychologist  Tobacco Use  . Smoking status: Never Smoker  . Smokeless tobacco: Never Used  Substance and Sexual Activity  . Alcohol use: Yes    Alcohol/week: 2.0 standard drinks    Types: 2 Glasses of wine per week    Comment: occ wine  . Drug use: No  . Sexual activity: Not on file

## 2019-02-05 ENCOUNTER — Other Ambulatory Visit: Payer: Self-pay | Admitting: Orthopaedic Surgery

## 2019-02-05 ENCOUNTER — Encounter: Payer: Self-pay | Admitting: Radiology

## 2019-02-06 NOTE — Telephone Encounter (Signed)
Please advise 

## 2019-02-07 ENCOUNTER — Other Ambulatory Visit: Payer: Self-pay

## 2019-02-07 ENCOUNTER — Ambulatory Visit: Payer: Self-pay

## 2019-02-07 ENCOUNTER — Other Ambulatory Visit: Payer: Self-pay | Admitting: Internal Medicine

## 2019-02-07 ENCOUNTER — Ambulatory Visit (INDEPENDENT_AMBULATORY_CARE_PROVIDER_SITE_OTHER): Payer: Medicare Other | Admitting: Physical Medicine and Rehabilitation

## 2019-02-07 ENCOUNTER — Encounter: Payer: Self-pay | Admitting: Physical Medicine and Rehabilitation

## 2019-02-07 VITALS — BP 117/68 | HR 76

## 2019-02-07 DIAGNOSIS — M48062 Spinal stenosis, lumbar region with neurogenic claudication: Secondary | ICD-10-CM | POA: Diagnosis not present

## 2019-02-07 DIAGNOSIS — M5416 Radiculopathy, lumbar region: Secondary | ICD-10-CM

## 2019-02-07 MED ORDER — BETAMETHASONE SOD PHOS & ACET 6 (3-3) MG/ML IJ SUSP
12.0000 mg | Freq: Once | INTRAMUSCULAR | Status: AC
  Administered 2019-02-07: 12 mg

## 2019-02-07 NOTE — Progress Notes (Signed)
 .  Numeric Pain Rating Scale and Functional Assessment Average Pain 7   In the last MONTH (on 0-10 scale) has pain interfered with the following?  1. General activity like being  able to carry out your everyday physical activities such as walking, climbing stairs, carrying groceries, or moving a chair?  Rating(6)   +Driver, -BT, -Dye Allergies.

## 2019-02-15 ENCOUNTER — Encounter: Payer: Self-pay | Admitting: Internal Medicine

## 2019-02-15 NOTE — Telephone Encounter (Signed)
consider changing to extended release preparation  And keep at same dose first  Carbamezapine er 200 mg take 2 po bid  Or 400 mg 1 po bid  To see if covers and avoids break through pain before increasing dose  If wishing to priced let me know pharmacy and we can send in this different prep.  Then would like a follow up visit or phone or virtual etc   In 2-3 weeks about  How the pain is  Doing

## 2019-02-18 ENCOUNTER — Other Ambulatory Visit: Payer: Self-pay

## 2019-02-18 ENCOUNTER — Ambulatory Visit (INDEPENDENT_AMBULATORY_CARE_PROVIDER_SITE_OTHER): Payer: Medicare Other | Admitting: *Deleted

## 2019-02-18 DIAGNOSIS — Z23 Encounter for immunization: Secondary | ICD-10-CM

## 2019-02-19 MED ORDER — CARBAMAZEPINE ER 200 MG PO TB12: 400.0000 mg | ORAL_TABLET | Freq: Two times a day (BID) | ORAL | 2 refills | Status: DC

## 2019-02-19 NOTE — Progress Notes (Signed)
[name] - 78 y.o. female MRN [id number]  Date of birth: [birth year]  Office Visit Note: Visit Date: 01/03/2019 PCP: [name], MD Referred by: [name], MD  Subjective: Chief Complaint  Patient presents with  . Lower Back - Pain  . Left Thigh - Pain  . Right Leg - Pain   HPI: [name] is a 78 y.o. female who comes in today For planned bile lateral L4 transforaminal epidural steroid injection.  She has been seen in our office by Dr. Jean Rosenthal for full work-up of her lumbar spine.  She reports several weeks now of severe 7 or more out of 10 pain which is really not gotten any better using opioid medication and other medications and time.  She reports pain across the lower back into the left thigh and then also right leg all the way down to the foot.  She reports bending and standing make the pain worse.  She has not had prior lumbar surgery.  MRI was performed showing multifactorial severe stenosis at L4-5 with disc herniation really more right-sided but facet joint cyst emanating into the canal both causing significant narrowing.  We discussed her spine issue and looked at the MRI with her.  She could see some resolution of the disc herniation over time and we have also seen the facet joint cyst diminish over time.  We would first look at epidural injection and then consider facet joint aspiration.  ROS Otherwise per HPI.  Assessment & Plan: Visit Diagnoses:  1. Lumbar radiculopathy   2. Spinal stenosis of lumbar region with neurogenic claudication     Plan: No additional findings.   Meds & Orders:  Meds ordered this encounter  Medications  . betamethasone acetate-betamethasone sodium phosphate (CELESTONE) injection 12 mg    Orders Placed This Encounter  Procedures  . XR C-ARM NO REPORT  . Epidural Steroid injection    Follow-up: Return if symptoms worsen or fail to improve.   Procedures: No procedures performed  Lumbosacral  Transforaminal Epidural Steroid Injection - Sub-Pedicular Approach with Fluoroscopic Guidance  Patient: [name]      Date of Birth: June 17, 1940 MRN: [id number] PCP: [name], MD      Visit Date: 01/03/2019   Universal Protocol:    Date/Time: 01/03/2019  Consent Given By: the patient  Position: PRONE  Additional Comments: Vital signs were monitored before and after the procedure. Patient was prepped and draped in the usual sterile fashion. The correct patient, procedure, and site was verified.   Injection Procedure Details:  Procedure Site One Meds Administered:  Meds ordered this encounter  Medications  . betamethasone acetate-betamethasone sodium phosphate (CELESTONE) injection 12 mg    Laterality: Bilateral  Location/Site:  L4-L5  Needle size: 22 G  Needle type: Spinal  Needle Placement: Transforaminal  Findings:    -Comments: Excellent flow of contrast along the nerve and into the epidural space.  Procedure Details: After squaring off the end-plates to get a true AP view, the C-arm was positioned so that an oblique view of the foramen as noted above was visualized. The target area is just inferior to the "nose of the scotty dog" or sub pedicular. The soft tissues overlying this structure were infiltrated with 2-3 ml. of 1% Lidocaine without Epinephrine.  The spinal needle was inserted toward the target using a "trajectory" view along the fluoroscope beam.  Under AP and lateral visualization, the needle was advanced so it did not puncture  dura and was located close the 6 O'Clock position of the pedical in AP tracterory. Biplanar projections were used to confirm position. Aspiration was confirmed to be negative for CSF and/or blood. A 1-2 ml. volume of Isovue-250 was injected and flow of contrast was noted at each level. Radiographs were obtained for documentation purposes.   After attaining the desired flow of contrast documented above, a 0.5 to  1.0 ml test dose of 0.25% Marcaine was injected into each respective transforaminal space.  The patient was observed for 90 seconds post injection.  After no sensory deficits were reported, and normal lower extremity motor function was noted,   the above injectate was administered so that equal amounts of the injectate were placed at each foramen (level) into the transforaminal epidural space.   Additional Comments:  The patient tolerated the procedure well Dressing: 2 x 2 sterile gauze and Band-Aid    Post-procedure details: Patient was observed during the procedure. Post-procedure instructions were reviewed.  Patient left the clinic in stable condition.     Clinical History: MRI LUMBAR SPINE WITHOUT CONTRAST  TECHNIQUE: Multiplanar, multisequence MR imaging of the lumbar spine was performed. No intravenous contrast was administered.  COMPARISON:  Radiographs of the lumbar spine 11/19/2018, CT abdomen/pelvis 03/12/2015, lumbar spine MRI 01/15/2015  FINDINGS: Segmentation: There are 5 non-rib-bearing lumbar type vertebral bodies. The caudal most well-formed intervertebral disc is designated L5-S1. Transitional lumbosacral anatomy with L5-S1 left-sided pseudoarthrosis.  Alignment:  The vertebrae are normally aligned.  Vertebrae: Vertebral body height is maintained. No suspicious osseous lesions. Focal fat versus tiny vertebral body hemangiomas at multiple levels. L2-L3 ventrolateral osteophyte.  Conus medullaris and cauda equina: Conus extends to the L1-L2 level. Conus and cauda equina appear normal.  Paraspinal and other soft tissues: Atrophy of the lumbar paraspinal musculature. Several posteriorly projecting synovial facet cysts within the lower lumbar spine. Bilateral T2 hyperintense renal lesions are incompletely assessed, but likely reflect renal cysts.  Disc levels:  Mild multilevel disc degeneration greatest at L2-L3, similar to prior exam.  L1-L2:  No significant disc herniation, spinal canal stenosis or neural foraminal narrowing.  L2-L3: Small disc bulge. No significant spinal canal stenosis or neural foraminal narrowing. Findings are similar to prior MRI.  L3-L4: Minimal disc bulge. Unchanged small superimposed right subarticular/foraminal disc protrusion. Facet arthrosis/ligamentum flavum hypertrophy. The disc protrusion minimally effaces the right ventrolateral thecal sac and may contact the descending right L4 nerve root. No significant central canal stenosis. Mild relative bilateral neural foraminal narrowing. Findings are similar to prior MRI.  L4-L5: Interval progression of a disc bulge with new superimposed broad-based right subarticular/foraminal disc protrusion. Interval progression of facet arthrosis/ligamentum flavum hypertrophy. 12 mm anteriorly projecting left-sided synovial facet cyst extending into the spinal canal, new from prior MRI. Moderate/severe center canal stenosis has significantly progressed. Interval progression of bilateral lateral recess stenosis with encroachment upon the bilateral descending L5 nerve roots. The disc protrusion contributes to right lateral recess stenosis. The synovial facet cyst contributes to left lateral recess stenosis. Mild bilateral neural foraminal narrowing, also progressed from prior MRI.  L5-S1: No disc herniation. Mild facet arthrosis. No significant spinal canal stenosis or neural foraminal narrowing.  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 A disc bulge and facet arthrosis/ligamentum flavum hypertrophy have progressed. New broad-based right subarticular/foraminal disc protrusion, as well as left-sided anteriorly projecting synovial facet cyst. Now moderate/severe central canal stenosis with bilateral lateral recesses narrowing at this low. Mild bilateral neural foraminal narrowing has also progressed.  Lumbar  spondylosis is otherwise unchanged. No more than mild spinal canal or neural foraminal narrowing at the remaining levels.   Electronically Signed   By: Kellie Simmering   On: 12/13/2018 09:41   She reports that she has never smoked. She has never used smokeless tobacco.  Recent Labs    05/25/18 1052  HGBA1C 5.5    Objective:  VS:  HT:    WT:   BMI:     BP:121/70  HR:72bpm  TEMP: ( )  RESP:  Physical Exam  Ortho Exam Imaging: No results found.  Past Medical/Family/Surgical/Social History: Medications & Allergies reviewed per EMR, new medications updated. Patient Active Problem List   Diagnosis Date Noted  . Colitis 11/27/2017  . Diarrhea 11/27/2017  . Closed head injury 11/27/2015  . Unilateral headache 05/26/2015  . ARF (acute renal failure) ([place]) 03/12/2015  . Gallstones 03/12/2015  . Nausea without vomiting 12/10/2014  . Essential hypertension 05/29/2014  . 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 Abdominal pain 05/29/2013  . Agent resistant to multiple antibiotics 05/29/2013  . Low back pain radiating to right leg 01/16/2013  . Sinus problem 11/20/2012  . Chronic headaches morning 10/19/2012  . Low sodium levels  133 10/19/2012  . Leg pain, posterior 08/16/2012  . Sciatic neuritis 08/16/2012  . Sleep disturbance, unspecified 08/16/2012  . Medication management 03/24/2012  . Preventative health care 09/24/2011  . Trigeminal neuralgia 09/24/2011  . Postmenopausal HRT (hormone replacement therapy) 09/24/2011  . Polycythemia 09/20/2011  . Morning headache 02/01/2011  . Hearing aid worn   . Abnormal blood finding   . LOCALIZED SUPERFICIAL SWELLING MASS OR LUMP 09/21/2009  . DYSPNEA 05/07/2008  . Hypnic headache 03/19/2008  . 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 Headache(784.0) 06/05/2007  . JAW PAIN 04/06/2007  . OSTEOARTHRITIS 04/06/2007  . ABNORMAL RESULT, FUNCTION STUDY, [place] 02/05/2007  . HYPERLIPIDEMIA 12/25/2006  . HYPERTENSION  12/25/2006   Past Medical History:  Diagnosis Date  . ADJ DISORDER WITH MIXED ANXIETY & DEPRESSED MOOD 03/03/2010   Qualifier: Diagnosis of  By: [name], [name]   . Closed head injury 02/01/2011   from syncope and had scalp laceration  neg ct .    Marland Kitchen Closed head injury 5-6 yrs ago  . Complication of anesthesia    migraine several hours after general anesthesia  . Fatty liver   . Gall stones 2016   see ct scan neg HIDA   . GERD (gastroesophageal reflux disease)   . Hearing aid worn   . HOH (hard of hearing)    both ears  . Hyperlipidemia   . Hypertension    echo nl lv function  mild dilitation 2009  . Kidney infection    few yrs ago in hospital  . Medication side effect 09/02/2010   Poss muscle se of 10 crestor   . Migraine    hypnic HA eval by Dr. Earley Favor in the past  . Polycythemia   . 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 Pyelonephritis 03/12/2015  . 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 Syncope 02/01/2011   In shower on vacation  sustained head laceration  8 sutures Had ed visit neg head ct labs and x ray   . Trigeminal neuralgia pain    Family History  Problem Relation Age of Onset  . Ovarian cancer Mother   . Stroke Mother   . Alcohol abuse Father   . Stroke Father   .  Diabetes Brother   . Cancer Paternal Aunt        leukemia, unknown type  . Seizures Daughter   . Hypertension Other   . Colon cancer Neg Hx    Past Surgical History:  Procedure Laterality Date  . ABDOMINAL HYSTERECTOMY  2002   tubal  . CARDIAC CATHETERIZATION  2000   chest pains neg  . CHOLECYSTECTOMY N/A 02/21/2017   Procedure: LAPAROSCOPIC CHOLECYSTECTOMY WITH INTRAOPERATIVE CHOLANGIOGRAM;  Surgeon: Armandina Gemma, MD;  Location: [facility];  Service: General;  Laterality: N/A;  . COLONOSCOPY     multiple  . CRANIOTOMY  12/09/2011   nerve decompression right trigeminal   . DOPPLER ECHOCARDIOGRAPHY  2009   nl lv function mild lv dilitation  . EYE SURGERY Bilateral     ioc for catatracts  . laparoscopic gallbladder surgery  02/16/2017   Fax from [facility]  . OOPHORECTOMY Bilateral 2002  . rt shoulder surgery     Social History   Occupational History    Comment: retired Forensic psychologist  Tobacco Use  . Smoking status: Never Smoker  . Smokeless tobacco: Never Used  Substance and Sexual Activity  . Alcohol use: Yes    Alcohol/week: 2.0 standard drinks    Types: 2 Glasses of wine per week    Comment: occ wine  . Drug use: No  . Sexual activity: Not on file

## 2019-02-19 NOTE — Telephone Encounter (Signed)
I have just sent this in  . Let me know if any help after 1-2 weeks and we can then try increasing dose  As tolerated

## 2019-02-25 NOTE — Progress Notes (Signed)
[name] - 78 y.o. female MRN [id number]  Date of birth: 31-Jan-1941  Office Visit Note: Visit Date: 02/07/2019 PCP: [name], MD Referred by: [name], MD  Subjective: Chief Complaint  Patient presents with  . Lower Back - Pain  . Left Leg - Pain   HPI:  [name] is a 78 y.o. female who comes in today For planned repeat transforaminal injection at L4.  Patient had prior bilateral L4 transforaminal injection with temporary relief of symptoms which were fairly good.  Right side really has gotten much better as the left side that bothers her the most.  MRI was performed showing multifactorial severe stenosis at L4-5 with disc herniation really more right-sided but facet joint cyst emanating into the canal both causing significant narrowing.  We discussed her spine issue and looked at the MRI with her.  She could see some resolution of the disc herniation over time and we have also seen the facet joint cyst diminish over time.  She has somewhat of a transitional appearing L5 segment.  Depending on this relief today could look at facet joint cyst injection.  Patient may have to consider lumbar laminectomy.  ROS Otherwise per HPI.  Assessment & Plan: Visit Diagnoses:  1. Lumbar radiculopathy   2. Spinal stenosis of lumbar region with neurogenic claudication     Plan: No additional findings.   Meds & Orders:  Meds ordered this encounter  Medications  . betamethasone acetate-betamethasone sodium phosphate (CELESTONE) injection 12 mg    Orders Placed This Encounter  Procedures  . XR C-ARM NO REPORT  . Epidural Steroid injection    Follow-up: No follow-ups on file.   Procedures: No procedures performed  Lumbosacral Transforaminal Epidural Steroid Injection - Sub-Pedicular Approach with Fluoroscopic Guidance  Patient: [name]      Date of Birth: [birth year] MRN: [id number] PCP: [name], MD      Visit Date: 02/07/2019    Universal Protocol:    Date/Time: 02/07/2019  Consent Given By: the patient  Position: PRONE  Additional Comments: Vital signs were monitored before and after the procedure. Patient was prepped and draped in the usual sterile fashion. The correct patient, procedure, and site was verified.   Injection Procedure Details:  Procedure Site One Meds Administered:  Meds ordered this encounter  Medications  . betamethasone acetate-betamethasone sodium phosphate (CELESTONE) injection 12 mg    Laterality: Left  Location/Site:  L4-L5  Needle size: 22 G  Needle type: Spinal  Needle Placement: Transforaminal  Findings:    -Comments: Excellent flow of contrast along the nerve and into the epidural space.  Procedure Details: After squaring off the end-plates to get a true AP view, the C-arm was positioned so that an oblique view of the foramen as noted above was visualized. The target area is just inferior to the "nose of the scotty dog" or sub pedicular. The soft tissues overlying this structure were infiltrated with 2-3 ml. of 1% Lidocaine without Epinephrine.  The spinal needle was inserted toward the target using a "trajectory" view along the fluoroscope beam.  Under AP and lateral visualization, the needle was advanced so it did not puncture dura and was located close the 6 O'Clock position of the pedical in AP tracterory. Biplanar projections were used to confirm position. Aspiration was confirmed to be negative for CSF and/or blood. A 1-2 ml. volume of Isovue-250 was injected and flow of contrast was noted at each level. Radiographs were  obtained for documentation purposes.   After attaining the desired flow of contrast documented above, a 0.5 to 1.0 ml test dose of 0.25% Marcaine was injected into each respective transforaminal space.  The patient was observed for 90 seconds post injection.  After no sensory deficits were reported, and normal lower extremity motor function was  noted,   the above injectate was administered so that equal amounts of the injectate were placed at each foramen (level) into the transforaminal epidural space.   Additional Comments:  The patient tolerated the procedure well Dressing: 2 x 2 sterile gauze and Band-Aid    Post-procedure details: Patient was observed during the procedure. Post-procedure instructions were reviewed.  Patient left the clinic in stable condition.     Clinical History: MRI LUMBAR SPINE WITHOUT CONTRAST  TECHNIQUE: Multiplanar, multisequence MR imaging of the lumbar spine was performed. No intravenous contrast was administered.  COMPARISON:  Radiographs of the lumbar spine 11/19/2018, CT abdomen/pelvis 03/12/2015, lumbar spine MRI 01/15/2015  FINDINGS: Segmentation: There are 5 non-rib-bearing lumbar type vertebral bodies. The caudal most well-formed intervertebral disc is designated L5-S1. Transitional lumbosacral anatomy with L5-S1 left-sided pseudoarthrosis.  Alignment:  The vertebrae are normally aligned.  Vertebrae: Vertebral body height is maintained. No suspicious osseous lesions. Focal fat versus tiny vertebral body hemangiomas at multiple levels. L2-L3 ventrolateral osteophyte.  Conus medullaris and cauda equina: Conus extends to the L1-L2 level. Conus and cauda equina appear normal.  Paraspinal and other soft tissues: Atrophy of the lumbar paraspinal musculature. Several posteriorly projecting synovial facet cysts within the lower lumbar spine. Bilateral T2 hyperintense renal lesions are incompletely assessed, but likely reflect renal cysts.  Disc levels:  Mild multilevel disc degeneration greatest at L2-L3, similar to prior exam.  L1-L2: No significant disc herniation, spinal canal stenosis or neural foraminal narrowing.  L2-L3: Small disc bulge. No significant spinal canal stenosis or neural foraminal narrowing. Findings are similar to prior MRI.  L3-L4:  Minimal disc bulge. Unchanged small superimposed right subarticular/foraminal disc protrusion. Facet arthrosis/ligamentum flavum hypertrophy. The disc protrusion minimally effaces the right ventrolateral thecal sac and may contact the descending right L4 nerve root. No significant central canal stenosis. Mild relative bilateral neural foraminal narrowing. Findings are similar to prior MRI.  L4-L5: Interval progression of a disc bulge with new superimposed broad-based right subarticular/foraminal disc protrusion. Interval progression of facet arthrosis/ligamentum flavum hypertrophy. 12 mm anteriorly projecting left-sided synovial facet cyst extending into the spinal canal, new from prior MRI. Moderate/severe center canal stenosis has significantly progressed. Interval progression of bilateral lateral recess stenosis with encroachment upon the bilateral descending L5 nerve roots. The disc protrusion contributes to right lateral recess stenosis. The synovial facet cyst contributes to left lateral recess stenosis. Mild bilateral neural foraminal narrowing, also progressed from prior MRI.  L5-S1: No disc herniation. Mild facet arthrosis. No significant spinal canal stenosis or neural foraminal narrowing.  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 A disc bulge and facet arthrosis/ligamentum flavum hypertrophy have progressed. New broad-based right subarticular/foraminal disc protrusion, as well as left-sided anteriorly projecting synovial facet cyst. Now moderate/severe central canal stenosis with bilateral lateral recesses narrowing at this low. Mild bilateral neural foraminal narrowing has also progressed.  Lumbar spondylosis is otherwise unchanged. No more than mild spinal canal or neural foraminal narrowing at the remaining levels.   Electronically Signed   By: Kellie Simmering   On: 12/13/2018 09:41     Objective:  VS:  HT:    WT:    BMI:  BP:117/68  HR:76bpm  TEMP: ( )  RESP:  Physical Exam  Ortho Exam Imaging: No results found.

## 2019-02-25 NOTE — Procedures (Signed)
Lumbosacral Transforaminal Epidural Steroid Injection - Sub-Pedicular Approach with Fluoroscopic Guidance  Patient: [name]      Date of Birth: 07-27-1940 MRN: [id number] PCP: [name], MD      Visit Date: 02/07/2019   Universal Protocol:    Date/Time: 02/07/2019  Consent Given By: the patient  Position: PRONE  Additional Comments: Vital signs were monitored before and after the procedure. Patient was prepped and draped in the usual sterile fashion. The correct patient, procedure, and site was verified.   Injection Procedure Details:  Procedure Site One Meds Administered:  Meds ordered this encounter  Medications  . betamethasone acetate-betamethasone sodium phosphate (CELESTONE) injection 12 mg    Laterality: Left  Location/Site:  L4-L5  Needle size: 22 G  Needle type: Spinal  Needle Placement: Transforaminal  Findings:    -Comments: Excellent flow of contrast along the nerve and into the epidural space.  Procedure Details: After squaring off the end-plates to get a true AP view, the C-arm was positioned so that an oblique view of the foramen as noted above was visualized. The target area is just inferior to the "nose of the scotty dog" or sub pedicular. The soft tissues overlying this structure were infiltrated with 2-3 ml. of 1% Lidocaine without Epinephrine.  The spinal needle was inserted toward the target using a "trajectory" view along the fluoroscope beam.  Under AP and lateral visualization, the needle was advanced so it did not puncture dura and was located close the 6 O'Clock position of the pedical in AP tracterory. Biplanar projections were used to confirm position. Aspiration was confirmed to be negative for CSF and/or blood. A 1-2 ml. volume of Isovue-250 was injected and flow of contrast was noted at each level. Radiographs were obtained for documentation purposes.   After attaining the desired flow of contrast documented above, a  0.5 to 1.0 ml test dose of 0.25% Marcaine was injected into each respective transforaminal space.  The patient was observed for 90 seconds post injection.  After no sensory deficits were reported, and normal lower extremity motor function was noted,   the above injectate was administered so that equal amounts of the injectate were placed at each foramen (level) into the transforaminal epidural space.   Additional Comments:  The patient tolerated the procedure well Dressing: 2 x 2 sterile gauze and Band-Aid    Post-procedure details: Patient was observed during the procedure. Post-procedure instructions were reviewed.  Patient left the clinic in stable condition.

## 2019-02-27 ENCOUNTER — Other Ambulatory Visit: Payer: Self-pay | Admitting: Internal Medicine

## 2019-03-01 ENCOUNTER — Encounter: Payer: Self-pay | Admitting: Physical Medicine and Rehabilitation

## 2019-03-01 NOTE — Telephone Encounter (Signed)
See later message fu respoonse

## 2019-03-01 NOTE — Telephone Encounter (Signed)
Apologies this message got delayed (there were a number of messages that werent attached to the others)  So you havent been prescribed this med for over 2 years . Is  A high risk medication  since a narcotic controlled med .  And can have  Med interactions  Etc .  Im thinking we need to talk with phone visit to sort out  Plan .

## 2019-03-04 ENCOUNTER — Other Ambulatory Visit: Payer: Self-pay

## 2019-03-04 ENCOUNTER — Telehealth: Payer: Self-pay | Admitting: Internal Medicine

## 2019-03-04 ENCOUNTER — Ambulatory Visit (INDEPENDENT_AMBULATORY_CARE_PROVIDER_SITE_OTHER): Payer: Medicare Other | Admitting: Physician Assistant

## 2019-03-04 ENCOUNTER — Ambulatory Visit: Payer: Self-pay

## 2019-03-04 ENCOUNTER — Encounter: Payer: Self-pay | Admitting: Physician Assistant

## 2019-03-04 ENCOUNTER — Encounter: Payer: Self-pay | Admitting: Internal Medicine

## 2019-03-04 ENCOUNTER — Other Ambulatory Visit: Payer: Self-pay | Admitting: Physician Assistant

## 2019-03-04 ENCOUNTER — Telehealth (INDEPENDENT_AMBULATORY_CARE_PROVIDER_SITE_OTHER): Payer: Medicare Other | Admitting: Internal Medicine

## 2019-03-04 DIAGNOSIS — M47816 Spondylosis without myelopathy or radiculopathy, lumbar region: Secondary | ICD-10-CM

## 2019-03-04 DIAGNOSIS — M25532 Pain in left wrist: Secondary | ICD-10-CM

## 2019-03-04 DIAGNOSIS — Z79899 Other long term (current) drug therapy: Secondary | ICD-10-CM | POA: Diagnosis not present

## 2019-03-04 DIAGNOSIS — G500 Trigeminal neuralgia: Secondary | ICD-10-CM | POA: Diagnosis not present

## 2019-03-04 DIAGNOSIS — G43009 Migraine without aura, not intractable, without status migrainosus: Secondary | ICD-10-CM

## 2019-03-04 MED ORDER — BUTALBITAL-ASA-CAFF-CODEINE 50-325-40-30 MG PO CAPS: ORAL_CAPSULE | ORAL | 0 refills | Status: DC

## 2019-03-04 NOTE — Telephone Encounter (Signed)
Please advise 

## 2019-03-04 NOTE — Progress Notes (Signed)
Office Visit Note   Patient: [name]           Date of Birth: 30-Aug-1940           MRN: [id number] Visit Date: 03/04/2019              Requested by: [name], MD [place],  [place] [address] PCP: [name], MD   Assessment & Plan: Visit Diagnoses:  1. Pain in left wrist     Plan: She can discontinue the brace.  She is to do no heavy lifting or for at least the next couple weeks.  Resume activities as tolerated.  She is work on gentle range of motion of the wrist.  Follow-up with [place] as needed.  Questions encouraged  Follow-Up Instructions: Return if symptoms worsen or fail to improve.   Orders:  Orders Placed This Encounter  Procedures  . XR Wrist Complete Left   No orders of the defined types were placed in this encounter.     Procedures: No procedures performed   Clinical Data: No additional findings.   Subjective: Chief Complaint  Patient presents with  . Left Wrist - Pain, Follow-up    HPI  Review of Systems   Objective: Vital Signs: There were no vitals taken for this visit.  Physical Exam Constitutional:      Appearance: She is not ill-appearing.  Pulmonary:     Effort: Pulmonary effort is normal.  Neurological:     Mental Status: She is alert and oriented to person, place, and time.  Psychiatric:        Behavior: Behavior normal.     Ortho Exam Left wrist nontender reviewed the radius ulna.  No gross deformity.  Full range of motion of wrist with full supination pronation.  Volar and dorsiflexion.  Full motor of the left hand.  Sensation intact.  No rashes skin lesions or ulcerations erythema left wrist. Specialty Comments:  No specialty comments available.  Imaging: Xr Wrist Complete Left  Result Date: 03/04/2019 Left wrist 3 views: Abundant callus formation.  No change in position no malalignment, radius remains in anatomic position.  No other fractures identified.    PMFS History:  Patient Active Problem List   Diagnosis Date Noted  . Colitis 11/27/2017  . Diarrhea 11/27/2017  . Closed head injury 11/27/2015  . Unilateral headache 05/26/2015  . ARF (acute renal failure) ([place]) 03/12/2015  . Gallstones 03/12/2015  . Nausea without vomiting 12/10/2014  . Essential hypertension 05/29/2014  . 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 Abdominal pain 05/29/2013  . Agent resistant to multiple antibiotics 05/29/2013  . Low back pain radiating to right leg 01/16/2013  . Sinus problem 11/20/2012  . Chronic headaches morning 10/19/2012  . Low sodium levels  133 10/19/2012  . Leg pain, posterior 08/16/2012  . Sciatic neuritis 08/16/2012  . Sleep disturbance, unspecified 08/16/2012  . Medication management 03/24/2012  . Preventative health care 09/24/2011  . Trigeminal neuralgia 09/24/2011  . Postmenopausal HRT (hormone replacement therapy) 09/24/2011  . Polycythemia 09/20/2011  . Morning headache 02/01/2011  . Hearing aid worn   . Abnormal blood finding   . LOCALIZED SUPERFICIAL SWELLING MASS OR LUMP 09/21/2009  . DYSPNEA 05/07/2008  . Hypnic headache 03/19/2008  . 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 Headache(784.0) 06/05/2007  . JAW PAIN 04/06/2007  . OSTEOARTHRITIS 04/06/2007  . ABNORMAL RESULT, FUNCTION STUDY, [place] 02/05/2007  . HYPERLIPIDEMIA 12/25/2006  . HYPERTENSION 12/25/2006   Past Medical  History:  Diagnosis Date  . ADJ DISORDER WITH MIXED ANXIETY & DEPRESSED MOOD 03/03/2010   Qualifier: Diagnosis of  By: [name], [name]   . Closed head injury 02/01/2011   from syncope and had scalp laceration  neg ct .    Marland Kitchen Closed head injury 5-6 yrs ago  . Complication of anesthesia    migraine several hours after general anesthesia  . Fatty liver   . Gall stones 2016   see ct scan neg HIDA   . GERD (gastroesophageal reflux disease)   . Hearing aid worn   . HOH (hard of hearing)    both ears  . Hyperlipidemia   . Hypertension     echo nl lv function  mild dilitation 2009  . Kidney infection    few yrs ago in hospital  . Medication side effect 09/02/2010   Poss muscle se of 10 crestor   . Migraine    hypnic HA eval by Dr. Earley Favor in the past  . Polycythemia   . 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 Pyelonephritis 03/12/2015  . 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 Syncope 02/01/2011   In shower on vacation  sustained head laceration  8 sutures Had ed visit neg head ct labs and x ray   . Trigeminal neuralgia pain     Family History  Problem Relation Age of Onset  . Ovarian cancer Mother   . Stroke Mother   . Alcohol abuse Father   . Stroke Father   . Diabetes Brother   . Cancer Paternal Aunt        leukemia, unknown type  . Seizures Daughter   . Hypertension Other   . Colon cancer Neg Hx     Past Surgical History:  Procedure Laterality Date  . ABDOMINAL HYSTERECTOMY  2002   tubal  . CARDIAC CATHETERIZATION  2000   chest pains neg  . CHOLECYSTECTOMY N/A 02/21/2017   Procedure: LAPAROSCOPIC CHOLECYSTECTOMY WITH INTRAOPERATIVE CHOLANGIOGRAM;  Surgeon: Armandina Gemma, MD;  Location: [facility];  Service: General;  Laterality: N/A;  . COLONOSCOPY     multiple  . CRANIOTOMY  12/09/2011   nerve decompression right trigeminal   . DOPPLER ECHOCARDIOGRAPHY  2009   nl lv function mild lv dilitation  . EYE SURGERY Bilateral    ioc for catatracts  . laparoscopic gallbladder surgery  02/16/2017   Fax from [facility]  . OOPHORECTOMY Bilateral 2002  . rt shoulder surgery     Social History   Occupational History    Comment: retired Forensic psychologist  Tobacco Use  . Smoking status: Never Smoker  . Smokeless tobacco: Never Used  Substance and Sexual Activity  . Alcohol use: Yes    Alcohol/week: 2.0 standard drinks    Types: 2 Glasses of wine per week    Comment: occ wine  . Drug use: No  . Sexual activity: Not on file

## 2019-03-04 NOTE — Telephone Encounter (Signed)
Lennette Bihari with [employer].  States they received RX for butalbital-aspirin-caffeine-codeine (ASCOMP-CODEINE) 50-325-40-30 MG capsule.  However insurance doesn't cover.  He wants to know if they can get refill on the acetaminophen-codeine (TYLENOL #3) 300-30 MG tablet for pt, since this is covered by pt's insurance. Lennette Bihari can be reached at [phone]

## 2019-03-04 NOTE — Progress Notes (Signed)
Virtual Visit via Telephone Note  I connected with@ on 03/04/19 at  8:30 AM EDT by telephone and verified that I am speaking with the correct person using two identifiers.   I discussed the limitations, risks, security and privacy concerns of performing an evaluation and management service by telephone and the availability of in person appointments. I also discussed with the patient that there may be a patient responsible charge related to this service. The patient expressed understanding and agreed to proceed.  Location patient: home Location provider: work or home office Participants present for the call: patient, provider Patient did not have a visit in the prior 7 days to address this/these issue(s).   History of Present Illness:   [name] regarding medication rescue and migraines.  See previous messages. She has had migraines her whole life and has had a few more frequently none out of the ordinary however she would like a refill of her Fiorinal.  She does not use it very often and if perhaps has used it twice in the last 2 months.  She usually takes over-the-counter such as Excedrin Migraine or other and only resorts to this medication as rescue.  In the meantime she is under care for significant back arthritis sciatica and secondary pain radicular symptoms has had 2 injections per Dr. Ernestina Patches but no weakness.  MRI was done July 23.  She was prescribed Tylenol 3 for this but is currently not taking this but using over-the-counter Tylenol or ibuprofen Aleve.  If her condition is progressive or persistent surgery is the next option.  Trigeminal neuralgia that she has had for a number of years including decompression surgery at [facility].  She has been on carbon Collier Flowers for a number of years which reasonably controlled it however over the past recent she is having some increasing pain along the jawline where it is sometimes sensitive to pressure teeth.  She thinks it is  the same process just not encompassing the whole area describes it as shooting pain.  We had changed her to extended release carbamazepine 400 mg twice a day a week ago.  She has not really noticed a decrease in that pain. Yet  In the past she had increased her dose of immediate release carbamazepine and and had significant sedation and gait unsteadiness.  To top it all off her husband just had open heart surgery for a valve replacement is to come home for care today.  He apparently is stable but she needs to be there to help in his recovery.  Observations/Objective: Patient sounds cheerful and well on the phone. I do not appreciate any SOB. Speech and thought processing are grossly intact.  Her history is well remembered and appropriate. Patient reported vitals:  Assessment and Plan:    ICD-10-CM   1. Migraine without aura and without status migrainosus, not intractable  G43.009    Would like refill of rescue medicine not taking Tylenol 3 patient aware benefit risk.hx of multiple HA types  2. Medication management  Z79.899   3. Trigeminal neuralgia  G50.0    Continue same dose another week or so follow-up contact consider increased dosage adjustment of the extended release  4. 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  Discussed risk benefit  of each medications and adjustments.  Updated medication list.  Patient is aware not to take rescue medicine and Tylenol 3.  I discussed the assessment and treatment plan with the patient. The patient was provided an opportunity to ask questions and all were answered. The patient agreed with the plan and demonstrated an understanding of the instructions.   The patient was advised to call back or seek an in-person evaluation if the symptoms worsen or if the condition fails  to improve as anticipated.  I provided 18 minutes of non-face-to-face time during this encounter.   [name], MD

## 2019-03-04 NOTE — Telephone Encounter (Signed)
Please  Let patient know lack  of coverage and let pharmacist  Give her the pricing    She already has  t # 3 at home  And we woundt need to substitute

## 2019-03-04 NOTE — Telephone Encounter (Signed)
Left detailed message for pt 

## 2019-03-05 NOTE — Telephone Encounter (Signed)
This has been done in other thread

## 2019-03-05 NOTE — Telephone Encounter (Signed)
See phone visit fu

## 2019-03-08 ENCOUNTER — Encounter: Payer: Self-pay | Admitting: Physical Medicine and Rehabilitation

## 2019-03-08 DIAGNOSIS — M5416 Radiculopathy, lumbar region: Secondary | ICD-10-CM

## 2019-03-08 DIAGNOSIS — M48062 Spinal stenosis, lumbar region with neurogenic claudication: Secondary | ICD-10-CM

## 2019-03-11 ENCOUNTER — Other Ambulatory Visit: Payer: Self-pay | Admitting: Physical Medicine and Rehabilitation

## 2019-03-11 ENCOUNTER — Encounter: Payer: Self-pay | Admitting: Physical Medicine and Rehabilitation

## 2019-03-12 MED ORDER — CARBAMAZEPINE ER 100 MG PO TB12: 100.0000 mg | ORAL_TABLET | Freq: Two times a day (BID) | ORAL | 0 refills | Status: DC

## 2019-03-12 NOTE — Telephone Encounter (Signed)
sio I sent in 100 mg er to add to the  2 200 mg  Twice a day   Try 500 mg in am and 400 mg pm and after  5-7 days increase to 500 mg twice a day and see how this does .   Let me know how doing in 2 weeks or so

## 2019-03-14 ENCOUNTER — Other Ambulatory Visit: Payer: Self-pay | Admitting: Physical Medicine and Rehabilitation

## 2019-03-14 MED ORDER — DULOXETINE HCL 30 MG PO CPEP: 30.0000 mg | ORAL_CAPSULE | Freq: Every day | ORAL | 1 refills | Status: DC

## 2019-03-25 ENCOUNTER — Ambulatory Visit: Payer: Self-pay

## 2019-03-25 ENCOUNTER — Ambulatory Visit (INDEPENDENT_AMBULATORY_CARE_PROVIDER_SITE_OTHER): Payer: Medicare Other | Admitting: Physical Medicine and Rehabilitation

## 2019-03-25 VITALS — BP 132/72 | HR 65

## 2019-03-25 DIAGNOSIS — M5416 Radiculopathy, lumbar region: Secondary | ICD-10-CM | POA: Diagnosis not present

## 2019-03-25 DIAGNOSIS — M5116 Intervertebral disc disorders with radiculopathy, lumbar region: Secondary | ICD-10-CM

## 2019-03-25 DIAGNOSIS — M47816 Spondylosis without myelopathy or radiculopathy, lumbar region: Secondary | ICD-10-CM | POA: Diagnosis not present

## 2019-03-25 DIAGNOSIS — M48062 Spinal stenosis, lumbar region with neurogenic claudication: Secondary | ICD-10-CM | POA: Diagnosis not present

## 2019-03-25 MED ORDER — BACLOFEN 10 MG PO TABS: 10.0000 mg | ORAL_TABLET | Freq: Three times a day (TID) | ORAL | 1 refills | Status: DC

## 2019-03-25 MED ORDER — METHYLPREDNISOLONE ACETATE 80 MG/ML IJ SUSP
80.0000 mg | Freq: Once | INTRAMUSCULAR | Status: AC
  Administered 2019-03-25: 80 mg

## 2019-03-25 NOTE — Progress Notes (Signed)
.  Numeric Pain Rating Scale and Functional Assessment Average Pain 7   In the last MONTH (on 0-10 scale) has pain interfered with the following?  1. General activity like being  able to carry out your everyday physical activities such as walking, climbing stairs, carrying groceries, or moving a chair?  Rating(7)   +Driver, -BT, -Dye Allergies.

## 2019-03-25 NOTE — Telephone Encounter (Signed)
I would like to try adding baclofen a muscle relaxer  That sometimes helps tn pain   Instead of raising the dose of carbamazepine.   At this time   I sent this in to [facility] .  Make Fu visit  Tele or video or in person in  3-4 weeks

## 2019-03-27 DIAGNOSIS — M47816 Spondylosis without myelopathy or radiculopathy, lumbar region: Secondary | ICD-10-CM | POA: Insufficient documentation

## 2019-03-27 DIAGNOSIS — M5116 Intervertebral disc disorders with radiculopathy, lumbar region: Secondary | ICD-10-CM | POA: Insufficient documentation

## 2019-03-27 DIAGNOSIS — M48062 Spinal stenosis, lumbar region with neurogenic claudication: Secondary | ICD-10-CM | POA: Insufficient documentation

## 2019-03-27 NOTE — Telephone Encounter (Signed)
Sorry your having  Tough time   I think we are going to have to get  A specialist involved again .   No guarantees    Do you want to work on going back to [facility]?  Or  Local   Such as [facility]  Or other [facility]?

## 2019-03-27 NOTE — Progress Notes (Signed)
[name] - 78 y.o. female MRN [id number]  Date of birth: 1941-03-27  Office Visit Note: Visit Date: 03/25/2019 PCP: [name], MD Referred by: [name], MD  Subjective: Chief Complaint  Patient presents with   Lower Back - Pain   Right Leg - Pain   Left Leg - Pain   HPI: [name] is a 78 y.o. female who comes in today For planned facet joint block and aspiration.  We have been seeing the patient now since August at the request of Dr. Jean Rosenthal who she follows from an orthopedic standpoint in our office.  Brief history is that she began having low back and at times bilateral leg pain at times right leg pain at times left leg pain that began sometime around May without specific injury.  Dr. Ninfa Linden treated her with tramadol and muscle relaxer as well as steroid taper and therapy without much relief although intermittently it was somewhat better.  MRI was obtained and reviewed.  MRIs reviewed with the patient today with spine models and imaging.  Report is detailed below but basically she has multifactorial stenosis at L4-5 with disc protrusion more right-sided foraminal and then facet joint cyst more left-sided foraminal lateral recess.  She has a transitional L5 segment.  We have completed 2 epidural injections without much relief.  They have been very short-lived.  I have actually tried Cymbalta as well as Lyrica and gabapentin without much relief.  She has known intolerances or allergies to hydrocodone and oxycodone.  Interestingly 1 message to [place] through the messaging service if she felt like she might not be getting pain control because of the opioid crisis but nonetheless she does have these as allergies.  We discussed this today with her and she continues to have a great deal of pain.  She is having bilateral pain today down the legs with paresthesia.  No focal weakness.  She does want to seek consultation with a neurosurgeon.  We will make  referral today.  She is going to ask around about specific doctors.  If she does not have a specific surgeon in mind we would probably refer her to Dr. Sherley Bounds.  Review of Systems  Constitutional: Negative for chills, fever, malaise/fatigue and weight loss.  HENT: Negative for hearing loss and sinus pain.   Eyes: Negative for blurred vision, double vision and photophobia.  Respiratory: Negative for cough and shortness of breath.   Cardiovascular: Negative for chest pain, palpitations and leg swelling.  Gastrointestinal: Negative for abdominal pain, nausea and vomiting.  Genitourinary: Negative for flank pain.  Musculoskeletal: Positive for back pain. Negative for myalgias.       Bilateral leg pain  Skin: Negative for itching and rash.  Neurological: Positive for tingling. Negative for tremors, focal weakness and weakness.  Endo/Heme/Allergies: Negative.   Psychiatric/Behavioral: Negative for depression.  All other systems reviewed and are negative.  Otherwise per HPI.  Assessment & Plan: Visit Diagnoses:  1. Spondylosis without myelopathy or radiculopathy, lumbar region   2. Lumbar radiculopathy   3. Spinal stenosis of lumbar region with neurogenic claudication   4. Radiculopathy due to lumbar intervertebral disc disorder     Plan: Findings:  Multifactorial stenosis at L4-5 with disc herniation facet arthritis facet joint cyst and a transitional segment at L5.  Given these findings and the fact that she has not gotten much relief with epidural injection we are going to try facet joint block and aspiration on the  left side.  She has left-sided symptoms more than right.  We will make referral to [facility].  We will have her follow-up with her primary care physician for possible referral to chronic pain management should that not be something that she decides to entertain in terms of surgery.  I think I am sort of out of options at this point.    Meds &  Orders:  Meds ordered this encounter  Medications   methylPREDNISolone acetate (DEPO-MEDROL) injection 80 mg    Orders Placed This Encounter  Procedures   Facet Injection   XR C-ARM NO REPORT    Follow-up: Return for Neurosurgery consultation.   Procedures: No procedures performed  Lumbar Facet Joint Intra-Articular Injection(s) with Fluoroscopic Guidance  Patient: [name]      Date of Birth: September 20, 1940 MRN: [id number] PCP: [name], MD      Visit Date: 03/25/2019   Universal Protocol:    Date/Time: 03/25/2019  Consent Given By: the patient  Position: PRONE   Additional Comments: Vital signs were monitored before and after the procedure. Patient was prepped and draped in the usual sterile fashion. The correct patient, procedure, and site was verified.   Injection Procedure Details:  Procedure Site One Meds Administered:  Meds ordered this encounter  Medications   methylPREDNISolone acetate (DEPO-MEDROL) injection 80 mg     Laterality: Left  Location/Site:  L4-L5  Needle size: 22 guage  Needle type: Spinal  Needle Placement: Articular  Findings:  -Comments: Excellent flow of contrast producing a partial arthrogram.  There was no fluid aspirated  Procedure Details: The fluoroscope beam is vertically oriented in AP, and the inferior recess is visualized beneath the lower pole of the inferior apophyseal process, which represents the target point for needle insertion. When direct visualization is difficult the target point is located at the medial projection of the vertebral pedicle. The region overlying each aforementioned target is locally anesthetized with a 1 to 2 ml. volume of 1% Lidocaine without Epinephrine.   The spinal needle was inserted into each of the above mentioned facet joints using biplanar fluoroscopic guidance. A 0.25 to 0.5 ml. volume of Isovue-250 was injected and a partial facet joint arthrogram was obtained. A  single spot film was obtained of the resulting arthrogram.    One to 1.25 ml of the steroid/anesthetic solution was then injected into each of the facet joints noted above.   Additional Comments:  The patient tolerated the procedure well Dressing: 2 x 2 sterile gauze and Band-Aid    Post-procedure details: Patient was observed during the procedure. Post-procedure instructions were reviewed.  Patient left the clinic in stable condition.    Clinical History: MRI LUMBAR SPINE WITHOUT CONTRAST  TECHNIQUE: Multiplanar, multisequence MR imaging of the lumbar spine was performed. No intravenous contrast was administered.  COMPARISON:  Radiographs of the lumbar spine 11/19/2018, CT abdomen/pelvis 03/12/2015, lumbar spine MRI 01/15/2015  FINDINGS: Segmentation: There are 5 non-rib-bearing lumbar type vertebral bodies. The caudal most well-formed intervertebral disc is designated L5-S1. Transitional lumbosacral anatomy with L5-S1 left-sided pseudoarthrosis.  Alignment:  The vertebrae are normally aligned.  Vertebrae: Vertebral body height is maintained. No suspicious osseous lesions. Focal fat versus tiny vertebral body hemangiomas at multiple levels. L2-L3 ventrolateral osteophyte.  Conus medullaris and cauda equina: Conus extends to the L1-L2 level. Conus and cauda equina appear normal.  Paraspinal and other soft tissues: Atrophy of the lumbar paraspinal musculature. Several posteriorly projecting synovial facet cysts within the  lower lumbar spine. Bilateral T2 hyperintense renal lesions are incompletely assessed, but likely reflect renal cysts.  Disc levels:  Mild multilevel disc degeneration greatest at L2-L3, similar to prior exam.  L1-L2: No significant disc herniation, spinal canal stenosis or neural foraminal narrowing.  L2-L3: Small disc bulge. No significant spinal canal stenosis or neural foraminal narrowing. Findings are similar to prior  MRI.  L3-L4: Minimal disc bulge. Unchanged small superimposed right subarticular/foraminal disc protrusion. Facet arthrosis/ligamentum flavum hypertrophy. The disc protrusion minimally effaces the right ventrolateral thecal sac and may contact the descending right L4 nerve root. No significant central canal stenosis. Mild relative bilateral neural foraminal narrowing. Findings are similar to prior MRI.  L4-L5: Interval progression of a disc bulge with new superimposed broad-based right subarticular/foraminal disc protrusion. Interval progression of facet arthrosis/ligamentum flavum hypertrophy. 12 mm anteriorly projecting left-sided synovial facet cyst extending into the spinal canal, new from prior MRI. Moderate/severe center canal stenosis has significantly progressed. Interval progression of bilateral lateral recess stenosis with encroachment upon the bilateral descending L5 nerve roots. The disc protrusion contributes to right lateral recess stenosis. The synovial facet cyst contributes to left lateral recess stenosis. Mild bilateral neural foraminal narrowing, also progressed from prior MRI.  L5-S1: No disc herniation. Mild facet arthrosis. No significant spinal canal stenosis or neural foraminal narrowing.  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 A disc bulge and facet arthrosis/ligamentum flavum hypertrophy have progressed. New broad-based right subarticular/foraminal disc protrusion, as well as left-sided anteriorly projecting synovial facet cyst. Now moderate/severe central canal stenosis with bilateral lateral recesses narrowing at this low. Mild bilateral neural foraminal narrowing has also progressed.  Lumbar spondylosis is otherwise unchanged. No more than mild spinal canal or neural foraminal narrowing at the remaining levels.   Electronically Signed   By: Kellie Simmering   On: 12/13/2018 09:41   She reports that she  has never smoked. She has never used smokeless tobacco.  Recent Labs    05/25/18 1052  HGBA1C 5.5    Objective:  VS:  HT:     WT:    BMI:      BP:132/72   HR:65bpm   TEMP: ( )   RESP:  Physical Exam Vitals signs and nursing note reviewed.  Constitutional:      General: She is not in acute distress.    Appearance: Normal appearance. She is well-developed. She is not ill-appearing.  HENT:     Head: Normocephalic and atraumatic.  Eyes:     Conjunctiva/sclera: Conjunctivae normal.     Pupils: Pupils are equal, round, and reactive to light.  Cardiovascular:     Rate and Rhythm: Normal rate.     Pulses: Normal pulses.  Pulmonary:     Effort: Pulmonary effort is normal.  Musculoskeletal:     Right lower leg: No edema.     Left lower leg: No edema.     Comments: Pain going from sit to stand in full extension some pain with facet loading across the lower back.  Mild tenderness over both greater trochanters some tenderness over the PSIS left more than right.  No pain with hip rotation good distal strength.  Skin:    General: Skin is warm and dry.     Findings: No erythema or rash.  Neurological:     General: No focal deficit present.     Mental Status: She is alert and oriented to person, place, and time.     Sensory: No sensory deficit.  Motor: No abnormal muscle tone.     Coordination: Coordination normal.     Gait: Gait normal.  Psychiatric:        Mood and Affect: Mood normal.        Behavior: Behavior normal.     Ortho Exam Imaging: No results found.  Past Medical/Family/Surgical/Social History: Medications & Allergies reviewed per EMR, new medications updated. Patient Active Problem List   Diagnosis Date Noted   Spondylosis without myelopathy or radiculopathy, lumbar region 03/27/2019   Radiculopathy due to lumbar intervertebral disc disorder 03/27/2019   Spinal stenosis of lumbar region with neurogenic claudication 03/27/2019   Colitis 11/27/2017   Diarrhea  11/27/2017   Closed head injury 11/27/2015   Unilateral headache 05/26/2015   ARF (acute renal failure) ([place]) 03/12/2015   Gallstones 03/12/2015   Nausea without vomiting 12/10/2014   Essential hypertension 05/29/2014   Post-traumatic headache 09/19/2013   Abdominal pain 05/29/2013   Agent resistant to multiple antibiotics 05/29/2013   Low back pain radiating to right leg 01/16/2013   Sinus problem 11/20/2012   Chronic headaches morning 10/19/2012   Low sodium levels  133 10/19/2012   Leg pain, posterior 08/16/2012   Sciatic neuritis 08/16/2012   Sleep disturbance, unspecified 08/16/2012   Medication management 03/24/2012   Preventative health care 09/24/2011   Trigeminal neuralgia 09/24/2011   Postmenopausal HRT (hormone replacement therapy) 09/24/2011   Polycythemia 09/20/2011   Morning headache 02/01/2011   Hearing aid worn    Abnormal blood finding    LOCALIZED SUPERFICIAL SWELLING MASS OR LUMP 09/21/2009   DYSPNEA 05/07/2008   Hypnic headache 03/19/2008   Cholelithiasis with chronic cholecystitis 03/19/2008   Headache(784.0) 06/05/2007   JAW PAIN 04/06/2007   OSTEOARTHRITIS 04/06/2007   ABNORMAL RESULT, FUNCTION STUDY, [place] 02/05/2007   HYPERLIPIDEMIA 12/25/2006   HYPERTENSION 12/25/2006   Past Medical History:  Diagnosis Date   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     Closed head injury 5-6 yrs ago   Complication of anesthesia    migraine several hours after general anesthesia   Fatty liver    Gall stones 2016   see ct scan neg HIDA    GERD (gastroesophageal reflux disease)    Hearing aid worn    HOH (hard of hearing)    both ears   Hyperlipidemia    Hypertension    echo nl lv function  mild dilitation 2009   Kidney infection    few yrs ago in hospital     Medication side effect 09/02/2010   Poss muscle se of 10 crestor    Migraine    hypnic HA eval by Dr. 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    Pyelonephritis 03/12/2015   Sensation of pain in anesthetized distribution of trigeminal nerve    Syncope 02/01/2011   In shower on vacation  sustained head laceration  8 sutures Had ed visit neg head ct labs and x ray    Trigeminal neuralgia pain    Family History  Problem Relation Age of Onset   Ovarian cancer Mother    Stroke Mother    Alcohol abuse Father    Stroke Father    Diabetes Brother    Cancer Paternal Aunt  leukemia, unknown type   Seizures Daughter    Hypertension Other    Colon cancer Neg Hx    Past Surgical History:  Procedure Laterality Date   ABDOMINAL HYSTERECTOMY  2002   tubal   CARDIAC CATHETERIZATION  2000   chest pains neg   CHOLECYSTECTOMY N/A 02/21/2017   Procedure: LAPAROSCOPIC CHOLECYSTECTOMY WITH INTRAOPERATIVE CHOLANGIOGRAM;  Surgeon: Armandina Gemma, MD;  Location: [facility];  Service: General;  Laterality: N/A;   COLONOSCOPY     multiple   CRANIOTOMY  12/09/2011   nerve decompression right trigeminal    DOPPLER ECHOCARDIOGRAPHY  2009   nl lv function mild lv dilitation   EYE SURGERY Bilateral    ioc for catatracts   laparoscopic gallbladder surgery  02/16/2017   Fax from [place] Bilateral 2002   rt shoulder surgery     Social History   Occupational History    Comment: retired Forensic psychologist  Tobacco Use   Smoking status: Never Smoker   Smokeless tobacco: Never Used  Substance and Sexual Activity   Alcohol use: Yes    Alcohol/week: 2.0 standard drinks    Types: 2 Glasses of wine per week    Comment: occ wine   Drug use: No   Sexual activity: Not on file

## 2019-03-27 NOTE — Procedures (Signed)
Lumbar Facet Joint Intra-Articular Injection(s) with Fluoroscopic Guidance  Patient: [name]      Date of Birth: [birth year] MRN: [id number] PCP: [name], MD      Visit Date: 03/25/2019   Universal Protocol:    Date/Time: 03/25/2019  Consent Given By: the patient  Position: PRONE   Additional Comments: Vital signs were monitored before and after the procedure. Patient was prepped and draped in the usual sterile fashion. The correct patient, procedure, and site was verified.   Injection Procedure Details:  Procedure Site One Meds Administered:  Meds ordered this encounter  Medications  . methylPREDNISolone acetate (DEPO-MEDROL) injection 80 mg     Laterality: Left  Location/Site:  L4-L5  Needle size: 22 guage  Needle type: Spinal  Needle Placement: Articular  Findings:  -Comments: Excellent flow of contrast producing a partial arthrogram.  There was no fluid aspirated  Procedure Details: The fluoroscope beam is vertically oriented in AP, and the inferior recess is visualized beneath the lower pole of the inferior apophyseal process, which represents the target point for needle insertion. When direct visualization is difficult the target point is located at the medial projection of the vertebral pedicle. The region overlying each aforementioned target is locally anesthetized with a 1 to 2 ml. volume of 1% Lidocaine without Epinephrine.   The spinal needle was inserted into each of the above mentioned facet joints using biplanar fluoroscopic guidance. A 0.25 to 0.5 ml. volume of Isovue-250 was injected and a partial facet joint arthrogram was obtained. A single spot film was obtained of the resulting arthrogram.    One to 1.25 ml of the steroid/anesthetic solution was then injected into each of the facet joints noted above.   Additional Comments:  The patient tolerated the procedure well Dressing: 2 x 2 sterile gauze and Band-Aid     Post-procedure details: Patient was observed during the procedure. Post-procedure instructions were reviewed.  Patient left the clinic in stable condition.

## 2019-03-28 ENCOUNTER — Telehealth: Payer: Self-pay | Admitting: Internal Medicine

## 2019-03-28 NOTE — Telephone Encounter (Signed)
Patient is wanting a call back regarding her symptoms from current medication . Please advise

## 2019-03-28 NOTE — Telephone Encounter (Signed)
baclofen (LIORESAL) 10 MG tablet  Pt is taking this medication and is experiencing dizziness drowsiness and sleepiness. Please call pt to advise

## 2019-03-29 NOTE — Telephone Encounter (Signed)
So can stop the  balcofen since not helping and making you dizzy.   The max dose of  crabamazepine is 1200 mg per day . YOu can try increasing to 600 mg  Twice a day  For a week   And then assess.   Yes I am not sure how to help your pain at this time and would like more expert  advice.  To help decide  On next step.   Want you to see your dentist to make sure no  Dental cause of the increasing pain . Has this been done?

## 2019-03-29 NOTE — Telephone Encounter (Signed)
Pt has sent message through mychart and these messages have been sent to Dr.panosh waiting for a response

## 2019-04-01 NOTE — Telephone Encounter (Signed)
Thanks for the update   Crossing  My fingers  That pain will be in the tolerable range  On this dose . Dr Vertell Limber is a good surgeon  You could also ask  Opinion about other specialists for TN care

## 2019-04-04 MED ORDER — CARBAMAZEPINE ER 200 MG PO TB12: 600.0000 mg | ORAL_TABLET | Freq: Two times a day (BID) | ORAL | 2 refills | Status: DC

## 2019-04-04 NOTE — Telephone Encounter (Signed)
Sent to [facility]  200 mg to take 3 bid  1200 mg total

## 2019-04-09 NOTE — Telephone Encounter (Signed)
Sorry you are hurting so much . I dont do  pain management and am not a rehab specialist    but am willing to try to help .  Does Dr Ernestina Patches advise what medicine to try for break through?  Were you on t # 3 at some point.   .  I could rx some t # 3 one time   And then would have Dr Vertell Limber to do any pain management or have him advise pain referral .    I would not be able to do  On  Going pain medications.  Let me know if  If you want to try t # 3 since you have had this before .

## 2019-04-11 NOTE — Telephone Encounter (Signed)
Appears that  Your back disease is quite enough to cause severe pain .  Make sure you have tried  a lidocaine patch over the back in case dampens  some of the secondary pain .   Did they try a TENS   Unit trial?    We can do pain management referral but that takes weeks to months and you have a delineated back disease that requires the  Neurosurgeon to  Evaluate a nyway .  And they have a pain management  Department  At the neurosurgery practice I think.

## 2019-04-16 MED ORDER — GABAPENTIN 300 MG PO CAPS: 300.0000 mg | ORAL_CAPSULE | Freq: Two times a day (BID) | ORAL | 0 refills | Status: DC

## 2019-04-16 NOTE — Telephone Encounter (Signed)
No the neurosurgery group has  Had a pain management also but takes longer to see them I think than the [facility]  But you can ask  If that will be helpful for you when you go .    Im ok with trying th gabapenitn but caution  Against falls etc  I sent in to [facility]

## 2019-04-29 DIAGNOSIS — Z87440 Personal history of urinary (tract) infections: Secondary | ICD-10-CM | POA: Diagnosis not present

## 2019-04-29 DIAGNOSIS — N952 Postmenopausal atrophic vaginitis: Secondary | ICD-10-CM | POA: Diagnosis not present

## 2019-04-29 MED ORDER — CARBAMAZEPINE ER 100 MG PO TB12: 100.0000 mg | ORAL_TABLET | Freq: Two times a day (BID) | ORAL | 1 refills | Status: DC

## 2019-04-29 NOTE — Telephone Encounter (Signed)
Shingrix vaccine needs to be obtain at pharmacy to be paid for by Medicare  And reminder can cause  Chills and flu like sx for a day or 2 in some people .   send in  100 mg of carbamazipine  er Yo be take   1 po bid Bid to have total dose of 500 mg bid ( she should have 200 mg at home     Less likely that your balance issues  Are from cardiac causes  if not noted to have  Heart failure or murmur on your recent exams  More likely from  meds or neurologic issue  If any referral then  Adviseneurology referral specifically to address the balance and vision and medications   How is the vision and balance  On  Lower doses ?  Your back disease may be adding to the  Walking difficulty

## 2019-05-01 DIAGNOSIS — M7138 Other bursal cyst, other site: Secondary | ICD-10-CM | POA: Diagnosis not present

## 2019-05-01 DIAGNOSIS — M5416 Radiculopathy, lumbar region: Secondary | ICD-10-CM | POA: Diagnosis not present

## 2019-05-01 DIAGNOSIS — M48062 Spinal stenosis, lumbar region with neurogenic claudication: Secondary | ICD-10-CM | POA: Diagnosis not present

## 2019-05-01 DIAGNOSIS — M545 Low back pain: Secondary | ICD-10-CM | POA: Diagnosis not present

## 2019-05-13 NOTE — Telephone Encounter (Signed)
So  dont know him but  Angela Burrow MD  Is a neurologist  And pain specialist at [facility] who specializes   in  [place] and Occipital Neuralgia  pain    . Do not know who else  is on his team of providers but  Seems  Like his expertise is what we need.     There is also a facial pain clinic at [facility] that is led by a DDS   But I would have  Opinion by neurologist for your problem and history .   After you  check into this we can refer.

## 2019-05-16 ENCOUNTER — Other Ambulatory Visit: Payer: Self-pay | Admitting: Internal Medicine

## 2019-05-20 DIAGNOSIS — Z01818 Encounter for other preprocedural examination: Secondary | ICD-10-CM | POA: Diagnosis not present

## 2019-05-22 DIAGNOSIS — M7138 Other bursal cyst, other site: Secondary | ICD-10-CM | POA: Diagnosis not present

## 2019-05-22 DIAGNOSIS — M5116 Intervertebral disc disorders with radiculopathy, lumbar region: Secondary | ICD-10-CM | POA: Diagnosis not present

## 2019-05-22 DIAGNOSIS — M48062 Spinal stenosis, lumbar region with neurogenic claudication: Secondary | ICD-10-CM | POA: Diagnosis not present

## 2019-05-22 DIAGNOSIS — M5136 Other intervertebral disc degeneration, lumbar region: Secondary | ICD-10-CM | POA: Diagnosis not present

## 2019-05-22 DIAGNOSIS — M4726 Other spondylosis with radiculopathy, lumbar region: Secondary | ICD-10-CM | POA: Diagnosis not present

## 2019-05-26 ENCOUNTER — Other Ambulatory Visit: Payer: Self-pay | Admitting: Internal Medicine

## 2019-05-27 ENCOUNTER — Other Ambulatory Visit: Payer: Self-pay

## 2019-05-27 ENCOUNTER — Encounter: Payer: Self-pay | Admitting: Internal Medicine

## 2019-05-27 ENCOUNTER — Ambulatory Visit (INDEPENDENT_AMBULATORY_CARE_PROVIDER_SITE_OTHER): Payer: Medicare Other | Admitting: Internal Medicine

## 2019-05-27 VITALS — BP 124/76 | HR 82 | Temp 97.8°F | Ht 66.0 in | Wt 134.8 lb

## 2019-05-27 DIAGNOSIS — E785 Hyperlipidemia, unspecified: Secondary | ICD-10-CM

## 2019-05-27 DIAGNOSIS — G500 Trigeminal neuralgia: Secondary | ICD-10-CM

## 2019-05-27 DIAGNOSIS — Z79899 Other long term (current) drug therapy: Secondary | ICD-10-CM | POA: Diagnosis not present

## 2019-05-27 DIAGNOSIS — Z0000 Encounter for general adult medical examination without abnormal findings: Secondary | ICD-10-CM | POA: Diagnosis not present

## 2019-05-27 DIAGNOSIS — R42 Dizziness and giddiness: Secondary | ICD-10-CM | POA: Diagnosis not present

## 2019-05-27 DIAGNOSIS — G4481 Hypnic headache: Secondary | ICD-10-CM

## 2019-05-27 LAB — HEPATIC FUNCTION PANEL
ALT: 23 U/L (ref 0–35)
AST: 24 U/L (ref 0–37)
Albumin: 4.1 g/dL (ref 3.5–5.2)
Alkaline Phosphatase: 99 U/L (ref 39–117)
Bilirubin, Direct: 0.2 mg/dL (ref 0.0–0.3)
Total Bilirubin: 0.8 mg/dL (ref 0.2–1.2)
Total Protein: 6.4 g/dL (ref 6.0–8.3)

## 2019-05-27 LAB — CBC WITH DIFFERENTIAL/PLATELET
Basophils Absolute: 0.1 10*3/uL (ref 0.0–0.1)
Basophils Relative: 1.3 % (ref 0.0–3.0)
Eosinophils Absolute: 0.1 10*3/uL (ref 0.0–0.7)
Eosinophils Relative: 2.7 % (ref 0.0–5.0)
HCT: 43.1 % (ref 36.0–46.0)
Hemoglobin: 14.9 g/dL (ref 12.0–15.0)
Lymphocytes Relative: 13.3 % (ref 12.0–46.0)
Lymphs Abs: 0.6 10*3/uL — ABNORMAL LOW (ref 0.7–4.0)
MCHC: 34.5 g/dL (ref 30.0–36.0)
MCV: 89.1 fl (ref 78.0–100.0)
Monocytes Absolute: 0.5 10*3/uL (ref 0.1–1.0)
Monocytes Relative: 10.7 % (ref 3.0–12.0)
Neutro Abs: 3.3 10*3/uL (ref 1.4–7.7)
Neutrophils Relative %: 72 % (ref 43.0–77.0)
Platelets: 368 10*3/uL (ref 150.0–400.0)
RBC: 4.83 Mil/uL (ref 3.87–5.11)
RDW: 12.5 % (ref 11.5–15.5)
WBC: 4.5 10*3/uL (ref 4.0–10.5)

## 2019-05-27 LAB — BASIC METABOLIC PANEL
BUN: 14 mg/dL (ref 6–23)
CO2: 24 mEq/L (ref 19–32)
Calcium: 9.7 mg/dL (ref 8.4–10.5)
Chloride: 88 mEq/L — ABNORMAL LOW (ref 96–112)
Creatinine, Ser: 0.71 mg/dL (ref 0.40–1.20)
GFR: 79.23 mL/min (ref >60.00)
Glucose, Bld: 97 mg/dL (ref 70–99)
Potassium: 3.9 mEq/L (ref 3.5–5.1)
Sodium: 124 mEq/L — ABNORMAL LOW (ref 135–145)

## 2019-05-27 LAB — LIPID PANEL
Cholesterol: 279 mg/dL — ABNORMAL HIGH (ref 0–200)
HDL: 118.8 mg/dL (ref >39.00)
LDL Cholesterol: 145 mg/dL — ABNORMAL HIGH (ref 0–99)
NonHDL: 160.57
Total CHOL/HDL Ratio: 2
Triglycerides: 78 mg/dL (ref 0.0–149.0)
VLDL: 15.6 mg/dL (ref 0.0–40.0)

## 2019-05-27 LAB — TSH: TSH: 0.54 u[IU]/mL (ref 0.35–4.50)

## 2019-05-27 NOTE — Patient Instructions (Signed)
Lab today and I agree  With reducing the   Tegretol  Dose trial.  Decrease to  600 mg am and 500 mg pm and after   3-5 days  decease to 500 mg twice a day .   Sometimes  Pain meds cause itching .   I agree with  Getting help  For the face pain and  Fu with Dr Vertell Limber.  Can try   Tylenol 500 mg   3 x per day  As needed should be ok for now  .   Plan ROV   depending .    Labs   Or 6 months

## 2019-05-27 NOTE — Progress Notes (Signed)
Chief Complaint  Patient presents with  . Annual Exam    pt has no concerns today     HPI: [name] 79 y.o. comes in today for Preventive Medicare exam and med management  Battling   Pain and neuropathy  TN " Carbemezipine.   And vision ?   Blurry .    On higher dose requested so decreasing dose no falling or diplopia     Dr Vertell Limber advised  A referral to  A specialist to help with the pain meds  [facility]    surgery in December  Seems to have helped her back pain .  Helped the  Leg pain . A lot      Dizzy at times from medication . No fall.   She has seen her dentist who said no  Cause of her pain but she feels her jaw tmj area pain may be different that  Other  Not taking gaba  ( ?no help)  Not taking baclofen as trial  Made her feel badly in the head  Now on 600 bid of ER tegretol/  BP doing ok high normal  Last  Food lat night  Has been itching since after surgery  but no pain med  Hydrocodone for a day or so  No rash  Husband had some itching .   Health Maintenance  Topic Date Due  . TETANUS/TDAP  09/18/2021  . INFLUENZA VACCINE  Completed  . DEXA SCAN  Completed  . PNA vac Low Risk Adult  Completed      Hearing:  Had aids  No using today   Vision:  No limitations at present . Last eye check UTD  Off at times from meds   Safety:  Has smoke detector and wears seat belts.  No firearms. No excess sun exposure. Sees dentist regularly.  Falls:  Recovering from back surgery    ROS:  GEN/ HEENT: No fever, significant weight changes sweats hx of has CV/ PULM; No chest pain shortness of breath cough, syncope,edema  change in exercise tolerance. GI /GU: No adominal pain, vomiting, change in bowel habits. No blood in the stool. No significant GU symptoms. On topical estrogen for uti suppression SKIN/HEME: ,no acute skin rashes suspicious lesions or bleeding. No lymphadenopathy, nodules, masses.  NEURO/ PSYCH:  See above  Thinks memory ok but  But not tested   No  depression anxiety. IMM/ Allergy: No unusual infections.  Allergy .   REST of 12 system review negative except as per HPI   Past Medical History:  Diagnosis Date  . Abdominal pain 05/29/2013   s/p rx of cephalo resistant e coli   but last rx NG  now residular ?  bladder sx repeat cx sx rx to ty and uro consult   . ADJ DISORDER WITH MIXED ANXIETY & DEPRESSED MOOD 03/03/2010   Qualifier: Diagnosis of  By: [name], [name]   . Closed head injury 02/01/2011   from syncope and had scalp laceration  neg ct .    Marland Kitchen Closed head injury 5-6 yrs ago  . Colitis 11/27/2017  . Complication of anesthesia    migraine several hours after general anesthesia  . Fatty liver   . Gall stones 2016   see ct scan neg HIDA   . GERD (gastroesophageal reflux disease)   . Hearing aid worn   . HOH (hard of hearing)    both ears  . Hyperlipidemia   . Hypertension  echo nl lv function  mild dilitation 2009  . Kidney infection    few yrs ago in hospital  . Medication side effect 09/02/2010   Poss muscle se of 10 crestor   . Migraine    hypnic HA eval by Dr. Earley Favor in the past  . Polycythemia   . 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 Pyelonephritis 03/12/2015  . 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 Syncope 02/01/2011   In shower on vacation  sustained head laceration  8 sutures Had ed visit neg head ct labs and x ray   . Trigeminal neuralgia pain     Family History  Problem Relation Age of Onset  . Ovarian cancer Mother   . Stroke Mother   . Alcohol abuse Father   . Stroke Father   . Diabetes Brother   . Cancer Paternal Aunt        leukemia, unknown type  . Seizures Daughter   . Hypertension Other   . Colon cancer Neg Hx       Outpatient Encounter Medications as of 05/27/2019  Medication Sig  . acetaminophen-codeine (TYLENOL #3) 300-30 MG tablet TAKE ONE TO TWO TABLETS BY MOUTH EVERY EIGHT HOURS AS NEEDED FOR MODERATE PAIN  . albuterol (PROVENTIL HFA;VENTOLIN HFA)  108 (90 Base) MCG/ACT inhaler Inhale 2 puffs into the lungs every 6 (six) hours as needed.  Marland Kitchen alendronate (FOSAMAX) 35 MG tablet Take 1 tablet (35 mg total) by mouth every 7 (seven) days. Take with a full glass of water on an empty stomach.  Marland Kitchen amLODipine (NORVASC) 10 MG tablet TAKE ONE TABLET BY MOUTH ONE TIME DAILY   . benazepril (LOTENSIN) 20 MG tablet Take 1 tablet by mouth once daily  . budesonide-formoterol (SYMBICORT) 160-4.5 MCG/ACT inhaler Inhale 2 puffs into the lungs 2 (two) times daily. (Patient taking differently: Inhale 2 puffs into the lungs 2 (two) times daily as needed. )  . butalbital-aspirin-caffeine-codeine (ASCOMP-CODEINE) 50-325-40-30 MG capsule TAKE 1-2 CAPSULES BY MOUTH EVERY FOUR TO SIX HOURS AS NEEDED FOR MIGRAINE RESCUE,MAX OF 6 CAPSULES IN 24 HOURS  . carbamazepine (TEGRETOL XR) 200 MG 12 hr tablet Take 3 tablets (600 mg total) by mouth 2 (two) times daily.  . chlorthalidone (HYGROTON) 25 MG tablet Take 1 tablet by mouth once daily  . conjugated estrogens (PREMARIN) vaginal cream Place 1 Applicatorful vaginally 2 (two) times a week.  . docusate sodium (COLACE) 250 MG capsule Take 1 capsule (250 mg total) by mouth daily. While taking pain medications  . DULoxetine (CYMBALTA) 30 MG capsule Take 1 capsule (30 mg total) by mouth daily. For 7 nights then take 2 capsules daily at night  . fluticasone (FLONASE) 50 MCG/ACT nasal spray Place 2 sprays into both nostrils as needed for allergies or rhinitis.   . RESTASIS 0.05 % ophthalmic emulsion Place 1 drop into both eyes 2 (two) times daily.   . [DISCONTINUED] baclofen (LIORESAL) 10 MG tablet Take 1 tablet (10 mg total) by mouth 3 (three) times daily. As needed for  Face pain  . [DISCONTINUED] gabapentin (NEURONTIN) 300 MG capsule Take 1 capsule (300 mg total) by mouth 2 (two) times daily. For pain  . [DISCONTINUED] amLODipine (NORVASC) 10 MG tablet TAKE ONE TABLET BY MOUTH ONE TIME DAILY    No facility-administered encounter  medications on file as of 05/27/2019.    EXAM:  BP 124/76 (BP Location: Right Arm, Patient Position: Sitting, Cuff Size: Normal)  Pulse 82   Temp 97.8 F (36.6 C) (Temporal)   Ht 5' 6"  (1.676 m)   Wt 134 lb 12.8 oz (61.1 kg)   SpO2 95%   BMI 21.76 kg/m   Body mass index is 21.76 kg/m.  Physical Exam: Vital signs reviewed GQQ:PYPP is a well-developed well-nourished alert cooperative   who appears stated age in no acute distress.   Masked  And has  Roller walker chair  Abel to gt up from chair and walk independent albiet  wihtout assistance  Min wide based  No tremor or foot drag  HEENT: normocephalic atraumatic , Eyes: PERRL EOM's full, conjunctiva clear, Nares: paten,t no deformity discharge or tenderness., Ears: no deformity EAC's clear TMs with normal landmarks. Mouth:masked NE NECK: supple without masses, thyromegaly  CHEST/PULM:  Clear to auscultation and percussion breath sounds equal no wheeze , rales or rhonchi. No chest wall deformities or tenderness. CV: PMI is nondisplaced, S1 S2 no gallops, murmurs, rubs. Peripheral pulses are present   feet slightly puffy no lesion ABDOMEN: Bowel sounds normal nontender  No guard or rebound, no hepato splenomegal no CVA tenderness.   Examined sitting   Not on table Extremtities:  No clubbing cyanosis , no acute joint swelling or redness no focal atrophy NEURO:  Oriented x3, cranial nerves 3-12 not tested    Eoms nl appear to be intact, no obvious focal weakness SKIN: No acute rashes normal turgor, color, no bruising or petechiae. PSYCH: Oriented, good eye contact, no obvious depression anxiety, LN: no cervical adenopathy No noted deficits in  attention, and speech. memory not formally tested   Lab Results  Component Value Date   WBC 4.0 05/25/2018   HGB 15.4 (H) 05/25/2018   HCT 45.5 05/25/2018   PLT 215.0 05/25/2018   GLUCOSE 85 05/25/2018   CHOL 259 (H) 05/25/2018   TRIG 58.0 05/25/2018   HDL 123.30 05/25/2018   LDLDIRECT 135.9  10/16/2012   LDLCALC 124 (H) 05/25/2018   ALT 19 05/25/2018   AST 21 05/25/2018   NA 136 05/25/2018   K 4.3 05/25/2018   CL 96 05/25/2018   CREATININE 0.83 05/25/2018   BUN 16 05/25/2018   CO2 32 05/25/2018   TSH 0.94 05/25/2018   INR 1.23 03/12/2015   HGBA1C 5.5 05/25/2018    Results:  Lumbar spine L1-L4 Femoral neck (FN) 33% distal radius  T-score -0.9 RFN: -1.3 LFN: -2.0 n/a  Change in BMD from previous DXA test (%) n/a n/a n/a  (*) statistically significant  Assessment: the BMD is low according to the [facility] classification for osteoporosis (see below). Fracture risk: moderate FRAX score: 10 year major osteoporotic risk: 21.4%. 10 year hip fracture risk: 6.0%. The thresholds for treatment are 20% and 3%, respectively.  ASSESSMENT AND PLAN:  Discussed the following assessment and plan:  Visit for preventive health examination  Medication management - Plan: Basic metabolic panel, CBC with Differential, Hepatic function panel, Lipid panel, TSH, Carbamazepine, Free and Total  Trigeminal neuralgia - Plan: Basic metabolic panel, CBC with Differential, Hepatic function panel, Lipid panel, TSH, Carbamazepine, Free and Total  Dizziness - Plan: Basic metabolic panel, CBC with Differential, Hepatic function panel, Lipid panel, TSH, Carbamazepine, Free and Total  Hypnic headache - Plan: Basic metabolic panel, CBC with Differential, Hepatic function panel, Lipid panel, TSH, Carbamazepine, Free and Total  Hyperlipidemia, unspecified hyperlipidemia type - Plan: Basic metabolic panel, CBC with Differential, Hepatic function panel, Lipid panel, TSH, Carbamazepine, Free and Total We discussed her medication because  of the  risk of side effects and I agree and would invite a specialist who can help manage and help with her medications for her trigeminal neuralgia and now her face pain poss tmj which may or may not be separate. She had been under Care Dr Loretta Plume but that was for Headaches in  the past  And not felt to  Have new  Ideas to offer at the time.   She states her dentist did not think anything related however some of her symptoms sound TMJ related whether she would be helped by a bite guard or other interventions unclear. At this time agree with decreasing the carbamazepine again to 600 500 and then 500 twice daily checking levels today consider decreasing dose again to avoid   Serious side effects .   Due for lab monitoring   Uncertain what to think of the itching although she was on hydrocodone and those symptoms should fade if related. Acetaminophen enough and should be safer than the ibuprofen can take 500 3 times daily if needed. It is fortunate that her back and leg pain related or so much better after the surgery she continues to recover. Updated lab test today. Her blood pressure appears to be controlled today. Last dexa -1.3 -2 elevated 10 year risk  Did not discussed  meds  Intervention  Today with other issues taking precedence  Patient Care Team: Idaliz Tinkle, [name], MD as PCP - General Cindie Crumbly (Neurosurgery) Irine Seal, MD as Attending Physician (Urology) Pieter Partridge, DO as Consulting Physician (Neurology) Erline Levine, MD as Consulting Physician (Neurosurgery)  Patient Instructions  Lab today and I agree  With reducing the   Tegretol  Dose trial.  Decrease to  600 mg am and 500 mg pm and after   3-5 days  decease to 500 mg twice a day .   Sometimes  Pain meds cause itching .   I agree with  Getting help  For the face pain and  Fu with Dr Vertell Limber.  Can try   Tylenol 500 mg   3 x per day  As needed should be ok for now  .   Plan ROV   depending .    Labs   Or 6 months         [name]. Jaimon Bugaj M.D.

## 2019-05-28 ENCOUNTER — Other Ambulatory Visit: Payer: Self-pay

## 2019-05-28 DIAGNOSIS — E871 Hypo-osmolality and hyponatremia: Secondary | ICD-10-CM

## 2019-05-28 NOTE — Progress Notes (Signed)
Blood results  show sodium level is too low and can cause serious headache dizziness  This can be caused by medications   and interactions ( diuretic  and Carbamezapine can do this)  for now   stop  the  chorthalidone immediately  , decrease the carbimazepine to 400 twice a  over the next  2 days( quicker than initially advised)  I am waiting for the drug level of the carbamazepine to plan further advice)    recheck BMP on Thursday or Friday  dx low sodium  . The rest of labs are good.

## 2019-05-30 ENCOUNTER — Other Ambulatory Visit (INDEPENDENT_AMBULATORY_CARE_PROVIDER_SITE_OTHER): Payer: Medicare Other

## 2019-05-30 ENCOUNTER — Other Ambulatory Visit: Payer: Self-pay

## 2019-05-30 DIAGNOSIS — E871 Hypo-osmolality and hyponatremia: Secondary | ICD-10-CM

## 2019-05-30 LAB — CARBAMAZEPINE, FREE AND TOTAL
CARBAMAZEPINE METABOLITE, FREE: 2.6 ug/mL — ABNORMAL HIGH (ref 0.1–1.0)
CARBAMAZEPINE METABOLITE, TOTAL: 5.5 ug/mL — ABNORMAL HIGH (ref 0.2–2.0)
CARBAMAZEPINE, TOTAL: 10.2 ug/mL (ref 4.0–12.0)
Carbamazepine Metabolite/: 2.9 ug/mL
Carbamazepine, Bound: 8.6 ug/mL
Carbamazepine, Free: 1.6 ug/mL (ref 1.0–3.0)

## 2019-05-30 LAB — BASIC METABOLIC PANEL
BUN: 22 mg/dL (ref 6–23)
CO2: 30 mEq/L (ref 19–32)
Calcium: 10.4 mg/dL (ref 8.4–10.5)
Chloride: 93 mEq/L — ABNORMAL LOW (ref 96–112)
Creatinine, Ser: 0.93 mg/dL (ref 0.40–1.20)
GFR: 58.03 mL/min — ABNORMAL LOW (ref >60.00)
Glucose, Bld: 107 mg/dL — ABNORMAL HIGH (ref 70–99)
Potassium: 4.2 mEq/L (ref 3.5–5.1)
Sodium: 133 mEq/L — ABNORMAL LOW (ref 135–145)

## 2019-05-30 NOTE — Progress Notes (Signed)
Sodium still low but much better . Plan recheck   bmp in   10 - 14 days and then will decide on medication dosing

## 2019-05-31 ENCOUNTER — Other Ambulatory Visit: Payer: Medicare Other

## 2019-05-31 ENCOUNTER — Other Ambulatory Visit: Payer: Self-pay

## 2019-05-31 DIAGNOSIS — E871 Hypo-osmolality and hyponatremia: Secondary | ICD-10-CM

## 2019-06-01 ENCOUNTER — Ambulatory Visit: Payer: Medicare Other | Attending: Internal Medicine

## 2019-06-01 DIAGNOSIS — Z23 Encounter for immunization: Secondary | ICD-10-CM

## 2019-06-01 NOTE — Progress Notes (Signed)
   Covid-19 Vaccination Clinic  Name:  [name]    MRN: [id number] DOB: 01-Dec-1940  06/01/2019  [name] was observed post Covid-19 immunization for 30 minutes based on pre-vaccination screening without incidence. She was provided with Vaccine Information Sheet and instruction to access the V-Safe system.   [name] was instructed to call 911 with any severe reactions post vaccine: Marland Kitchen Difficulty breathing  . Swelling of your face and throat  . A fast heartbeat  . A bad rash all over your body  . Dizziness and weakness    Immunizations Administered    Name Date Dose VIS Date Route   Pfizer COVID-19 Vaccine 06/01/2019 12:47 PM 0.3 mL 05/03/2019 Intramuscular   Manufacturer: [place]   Lot: [id number]   [place]: [id number]

## 2019-06-03 NOTE — Telephone Encounter (Signed)
Please  call her to get her lab appt  As requested   Orders are already in the system( anytime doesn't have to be fastin)

## 2019-06-04 ENCOUNTER — Ambulatory Visit: Payer: Self-pay

## 2019-06-04 NOTE — Telephone Encounter (Signed)
Message Routed to PCP CMA 

## 2019-06-04 NOTE — Telephone Encounter (Signed)
Please advise 

## 2019-06-04 NOTE — Telephone Encounter (Signed)
Incoming call from Patient stating that her Dentist diagnosed face pain neuralgia face pain.  R/T neurolgic face  Pain.   Patient needs to be referred to a Neurologist. There is a Garment/textile technologist at [facility] who see Patient.   Dr.  Jaynee Eagles would like an referral. Patient would also like a return phone call to [phone].  ASAP.  Patient is in extreme pain. Patient awaits return call

## 2019-06-05 NOTE — Telephone Encounter (Signed)
Dr Vertell Limber referred her but go ahead and refer  If that is what is needed  I beleove she has appt  In February.

## 2019-06-05 NOTE — Telephone Encounter (Signed)
But we could try adding lyrica    In interim.  That is used for nerve pain.  . And is controlled substance but is not a  Opiate .     Not sure if will help since  gabapentin didnt work   And med is related but may be worth a try   Let us know if  You want to try this.  And keep you neurology appt.

## 2019-06-06 NOTE — Telephone Encounter (Signed)
Pt no longer needs referral

## 2019-06-07 ENCOUNTER — Other Ambulatory Visit: Payer: Medicare Other

## 2019-06-11 ENCOUNTER — Other Ambulatory Visit (INDEPENDENT_AMBULATORY_CARE_PROVIDER_SITE_OTHER): Payer: Medicare Other

## 2019-06-11 ENCOUNTER — Other Ambulatory Visit: Payer: Self-pay

## 2019-06-11 DIAGNOSIS — H5213 Myopia, bilateral: Secondary | ICD-10-CM | POA: Diagnosis not present

## 2019-06-11 DIAGNOSIS — E871 Hypo-osmolality and hyponatremia: Secondary | ICD-10-CM | POA: Diagnosis not present

## 2019-06-11 LAB — BASIC METABOLIC PANEL
BUN: 26 mg/dL — ABNORMAL HIGH (ref 6–23)
CO2: 28 mEq/L (ref 19–32)
Calcium: 9.8 mg/dL (ref 8.4–10.5)
Chloride: 100 mEq/L (ref 96–112)
Creatinine, Ser: 0.89 mg/dL (ref 0.40–1.20)
GFR: 61.04 mL/min (ref >60.00)
Glucose, Bld: 114 mg/dL — ABNORMAL HIGH (ref 70–99)
Potassium: 4.4 mEq/L (ref 3.5–5.1)
Sodium: 137 mEq/L (ref 135–145)

## 2019-06-14 MED ORDER — ALENDRONATE SODIUM 70 MG PO TABS: 70.0000 mg | ORAL_TABLET | ORAL | 11 refills | Status: DC

## 2019-06-14 NOTE — Progress Notes (Signed)
Sodium now normal  Check  Your BP readings   For 3-4 days and let me know readings next week.

## 2019-06-14 NOTE — Telephone Encounter (Signed)
Refill the medication and continue   Let us k now if need refills.  BTW  The sodium level  In repeat was normal .   Check  Your BP readings   For 3-4 days and let me know readings next week.

## 2019-06-17 DIAGNOSIS — E871 Hypo-osmolality and hyponatremia: Secondary | ICD-10-CM

## 2019-06-18 NOTE — Telephone Encounter (Signed)
ansered in next message   About monitoring

## 2019-06-18 NOTE — Telephone Encounter (Signed)
Send in more Bp readings by end of week.  So we  Told  You to stop the  chorthalidone  And lover the carbamazepine because of the low sodium level  . We may need to add  Back  A lower dose possible to  Control BP but  Appreciate you sending these readings  To help decide.

## 2019-06-18 NOTE — Telephone Encounter (Signed)
You are correct  I would like  You to try the higher dose  Of  70 mg per week and if no issues  Continue the higher dose  .

## 2019-06-19 ENCOUNTER — Ambulatory Visit: Payer: Medicare Other

## 2019-06-20 NOTE — Telephone Encounter (Signed)
Lets try going back to 1/2 pill of the chorthalidone per day ( ie 12.5 mg )  Per day  And send in readings after   10 - 14 days    Check BMP after 2 weeks also  to see if the sodium level stays stable

## 2019-06-21 MED ORDER — CHLORTHALIDONE 25 MG PO TABS: 25.0000 mg | ORAL_TABLET | Freq: Every day | ORAL | 0 refills | Status: DC

## 2019-06-21 NOTE — Addendum Note (Signed)
Addended by: Modena Morrow R on: 06/21/2019 07:59 AM   Modules accepted: Orders

## 2019-06-22 ENCOUNTER — Ambulatory Visit: Payer: Medicare Other | Attending: Internal Medicine

## 2019-06-22 DIAGNOSIS — Z23 Encounter for immunization: Secondary | ICD-10-CM

## 2019-06-22 NOTE — Progress Notes (Signed)
   Covid-19 Vaccination Clinic  Name:  Tamrah Victorino    MRN: [id number] DOB: April 28, 1941  06/22/2019  Ms. Lallier was observed post Covid-19 immunization for 15 minutes without incidence. She was provided with Vaccine Information Sheet and instruction to access the V-Safe system.   Ms. Gopal was instructed to call 911 with any severe reactions post vaccine: Marland Kitchen Difficulty breathing  . Swelling of your face and throat  . A fast heartbeat  . A bad rash all over your body  . Dizziness and weakness    Immunizations Administered    Name Date Dose VIS Date Route   Pfizer COVID-19 Vaccine 06/22/2019  7:58 AM 0.3 mL 05/03/2019 Intramuscular   Manufacturer: [place]   Lot: [id number]   [place]: [address]-0

## 2019-07-04 DIAGNOSIS — I10 Essential (primary) hypertension: Secondary | ICD-10-CM | POA: Diagnosis not present

## 2019-07-04 DIAGNOSIS — G500 Trigeminal neuralgia: Secondary | ICD-10-CM | POA: Diagnosis not present

## 2019-07-08 ENCOUNTER — Ambulatory Visit: Payer: Medicare Other | Admitting: Neurology

## 2019-07-08 ENCOUNTER — Encounter: Payer: Self-pay | Admitting: Neurology

## 2019-07-08 ENCOUNTER — Other Ambulatory Visit: Payer: Self-pay

## 2019-07-08 ENCOUNTER — Telehealth: Payer: Self-pay | Admitting: Neurology

## 2019-07-08 VITALS — BP 126/77 | HR 74 | Temp 97.5°F | Ht 66.0 in | Wt 132.0 lb

## 2019-07-08 DIAGNOSIS — G500 Trigeminal neuralgia: Secondary | ICD-10-CM | POA: Diagnosis not present

## 2019-07-08 MED ORDER — BACLOFEN 10 MG PO TABS: 5.0000 mg | ORAL_TABLET | Freq: Three times a day (TID) | ORAL | 1 refills | Status: DC

## 2019-07-08 NOTE — Telephone Encounter (Signed)
[name], I spoke to Dr. Vertell Limber and he can perform any of the procedures we discussed, Dr. Melven Sartorius office will be in touch thanks

## 2019-07-08 NOTE — Patient Instructions (Signed)
Continue Trileptal Start Baclofen Keep eye lubricated until the movement returns, wear glasses Will refer to Dr. Rusty Aus Neurosurgery) or Dr. Arlan Organ ([facility]) for trigeminal procedures    Baclofen tablets What is this medicine? BACLOFEN (BAK loe fen) helps relieve spasms and cramping of muscles. It may be used to treat symptoms of multiple sclerosis or spinal cord injury. This medicine may be used for other purposes; ask your health care provider or pharmacist if you have questions. COMMON BRAND NAME(S): ED Baclofen, Lioresal What should I tell my health care provider before I take this medicine? They need to know if you have any of these conditions:  kidney disease  seizures  stroke  an unusual or allergic reaction to baclofen, other medicines, foods, dyes, or preservatives  pregnant or trying to get pregnant  breast-feeding How should I use this medicine? Take this medicine by mouth. Swallow it with a drink of water. Follow the directions on the prescription label. Do not take more medicine than you are told to take. Talk to your pediatrician regarding the use of this medicine in children. Special care may be needed. Overdosage: If you think you have taken too much of this medicine contact a poison control center or emergency room at once. NOTE: This medicine is only for you. Do not share this medicine with others. What if I miss a dose? If you miss a dose, take it as soon as you can. If it is almost time for your next dose, take only that dose. Do not take double or extra doses. What may interact with this medicine? Do not take this medication with any of the following medicines:  narcotic medicines for cough This medicine may also interact with the following medications:  alcohol  antihistamines for allergy, cough and cold  certain medicines for anxiety or sleep  certain medicines for depression like amitriptyline, fluoxetine, sertraline  certain medicines for  seizures like phenobarbital, primidone  general anesthetics like halothane, isoflurane, methoxyflurane, propofol  local anesthetics like lidocaine, pramoxine, tetracaine  medicines that relax muscles for surgery  narcotic medicines for pain  phenothiazines like chlorpromazine, mesoridazine, prochlorperazine, thioridazine This list may not describe all possible interactions. Give your health care provider a list of all the medicines, herbs, non-prescription drugs, or dietary supplements you use. Also tell them if you smoke, drink alcohol, or use illegal drugs. Some items may interact with your medicine. What should I watch for while using this medicine? Tell your doctor or health care professional if your symptoms do not start to get better or if they get worse. Do not suddenly stop taking your medicine. If you do, you may develop a severe reaction. If your doctor wants you to stop the medicine, the dose will be slowly lowered over time to avoid any side effects. Follow the advice of your doctor. You may get drowsy or dizzy. Do not drive, use machinery, or do anything that needs mental alertness until you know how this medicine affects you. Do not stand or sit up quickly, especially if you are an older patient. This reduces the risk of dizzy or fainting spells. Alcohol may interfere with the effect of this medicine. Avoid alcoholic drinks. If you are taking another medicine that also causes drowsiness, you may have more side effects. Give your health care provider a list of all medicines you use. Your doctor will tell you how much medicine to take. Do not take more medicine than directed. Call emergency for help if you have problems breathing  or unusual sleepiness. What side effects may I notice from receiving this medicine? Side effects that you should report to your doctor or health care professional as soon as possible:  allergic reactions like skin rash, itching or hives, swelling of the face,  lips, or tongue  breathing problems  changes in emotions or moods  changes in vision  chest pain  fast, irregular heartbeat  feeling faint or lightheaded, falls  hallucinations  loss of balance or coordination  ringing of the ears  seizures  trouble passing urine or change in the amount of urine  trouble walking  unusually weak or tired Side effects that usually do not require medical attention (report to your doctor or health care professional if they continue or are bothersome):  changes in taste  confusion  constipation  diarrhea  dry mouth  headache  muscle weakness  nausea, vomiting  trouble sleeping This list may not describe all possible side effects. Call your doctor for medical advice about side effects. You may report side effects to FDA at 1-800-FDA-1088. Where should I keep my medicine? Keep out of the reach of children. Store at room temperature between 15 and 30 degrees C (59 and 86 degrees F). Keep container tightly closed. Throw away any unused medicine after the expiration date. NOTE: This sheet is a summary. It may not cover all possible information. If you have questions about this medicine, talk to your doctor, pharmacist, or health care provider.  2020 Elsevier/Gold Standard (2017-02-18 09:56:42)

## 2019-07-08 NOTE — Telephone Encounter (Signed)
Called pt and a gentleman answered the phone. Pt currently resting. He will have her call back when she awakens.   When the pt calls back please give her Dr. Cathren Laine message below.

## 2019-07-08 NOTE — Progress Notes (Signed)
[facility]    Provider:  Dr Jaynee Eagles Requesting Provider: Erline Levine, MD Primary Care Provider:  [name], MD  CC: trigeminal neuralgia  HPI:  [name] is a 79 y.o. female here as requested by [name], [name], MD for Trigeminal neuralgia. 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 Mart Piggs in the past, remote kidney infection, hypertension, hyperlipidemia, hard of hearing, fatty liver, anxiety and depression.  I reviewed Dr. Melven Sartorius notes, she was seen there for evaluation of bilateral leg pain, increased leg pain left greater than right 6 months, weakness in both legs, she had epidural steroid injections x3, most recently left L4-L5 in November with no relief, numerous medications trialed most recently Cymbalta, Tylenol, Motrin.  Patient is on carbamazepine 500 mg twice daily for trigeminal neuralgia for which she underwent craniotomy and microvascular decompression at the [facility]  She was referred here for trigeminal neuralgia, I reviewed her neurologic examination which included normal strength, normal tone, normal reflexes, normal cranial nerve exams, Lhermitte's negative, Romberg negative no pronator drift, Hoffmann's normal, no clonus, diagnosed with lumbar stenosis with synovial cyst causing severe low back pain and bilateral lower extremity pain with left greater than right leg syndromes, L4-L5 laminectomy with resection of synovial cyst to be performed, referred over here for trigeminal neuralgia. Reviewed Dr. [name] notes, patient sodium was affected on Tegretol.   She had surgery at [facility] clinic in 2013 for trigeminal neuralgia, she was told hopefully the surgery would help but since then she had been increasing the medication and increased to 1281m a day and was decreased due to blood  work and side effects, and now she has stopped the carbamazepine. She was getting side effects from the carbamazepine but it was helping. Dr. SVertell Limberplaced her on Oxcarbazepine(trileptal) and now she is on cymbalta, she started the Trileptal a few weeks ago. She was started on 3036mand just a few days ago she was increased to 60020mwice a day(1200m66mday). She is not taking the cymbalta, was likely prescribed for low back pain (Dr. NewtErnestina Patcheshe ain starts on the right and shoots down her jaw, chewing, talking makes it worse, severe, shooting, same pain she has always experienced. The pain is severe today.   Review of Systems: Patient complains of symptoms per HPI as well as the following symptoms: hyponatremia. Pertinent negatives and positives per HPI. All others negative.   Social History   Socioeconomic History  . Marital status: Married    Spouse name: Not on file  . Number of children: 2  . Years of education: Not on file  . Highest education level: Not on file  Occupational History    Comment: retired realForensic psychologistbacco Use  . Smoking status: Never Smoker  . Smokeless tobacco: Never Used  Substance and Sexual Activity  . Alcohol use: Yes    Alcohol/week: 2.0 standard drinks    Types: 2 Glasses of wine per week    Comment: occ wine  . Drug use: No  . Sexual activity: Not on file  Other Topics Concern  . Not on file  Social History Narrative   Married   HH of 2-3 (god daughter)   Pets 2 dogs   Non smoker    Child is a physician   G2P2      Caffeine: 2 cups/day   Social Determinants  of Health   Financial Resource Strain:   . Difficulty of Paying Living Expenses: Not on file  Food Insecurity:   . Worried About Charity fundraiser in the Last Year: Not on file  . Ran Out of Food in the Last Year: Not on file  Transportation Needs:   . Lack of Transportation (Medical): Not on file  . Lack of Transportation (Non-Medical): Not on file  Physical Activity:   .  Days of Exercise per Week: Not on file  . Minutes of Exercise per Session: Not on file  Stress:   . Feeling of Stress : Not on file  Social Connections:   . Frequency of Communication with Friends and Family: Not on file  . Frequency of Social Gatherings with Friends and Family: Not on file  . Attends Religious Services: Not on file  . Active Member of Clubs or Organizations: Not on file  . Attends Archivist Meetings: Not on file  . Marital Status: Not on file  Intimate Partner Violence:   . Fear of Current or Ex-Partner: Not on file  . Emotionally Abused: Not on file  . Physically Abused: Not on file  . Sexually Abused: Not on file    Family History  Problem Relation Age of Onset  . Ovarian cancer Mother   . Stroke Mother   . Alcohol abuse Father   . Stroke Father   . Diabetes Brother   . Cancer Paternal Aunt        leukemia, unknown type  . Seizures Daughter   . Hypertension Other   . Colon cancer Neg Hx     Past Medical History:  Diagnosis Date  . Abdominal pain 05/29/2013   s/p rx of cephalo resistant e coli   but last rx NG  now residular ?  bladder sx repeat cx sx rx to ty and uro consult   . ADJ DISORDER WITH MIXED ANXIETY & DEPRESSED MOOD 03/03/2010   Qualifier: Diagnosis of  By: [name], [name]   . Closed head injury 02/01/2011   from syncope and had scalp laceration  neg ct .    Marland Kitchen Closed head injury 5-6 yrs ago  . Colitis 11/27/2017  . Complication of anesthesia    migraine several hours after general anesthesia  . Fatty liver   . Gall stones 2016   see ct scan neg HIDA   . GERD (gastroesophageal reflux disease)   . Hearing aid worn   . HOH (hard of hearing)    both ears  . Hyperlipidemia   . Hypertension    echo nl lv function  mild dilitation 2009  . Kidney infection    few yrs ago in hospital  . Medication side effect 09/02/2010   Poss muscle se of 10 crestor   . Migraine    hypnic HA eval by Dr. Earley Favor in the past  . Polycythemia   .  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 Pyelonephritis 03/12/2015  . 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 Syncope 02/01/2011   In shower on vacation  sustained head laceration  8 sutures Had ed visit neg head ct labs and x ray   . Trigeminal neuralgia pain     Patient Active Problem List   Diagnosis Date Noted  . 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 Radiculopathy due to lumbar intervertebral disc disorder 03/27/2019  . Spinal stenosis of lumbar  region with neurogenic claudication 03/27/2019  . Diarrhea 11/27/2017  . Closed head injury 11/27/2015  . Unilateral headache 05/26/2015  . ARF (acute renal failure) ([place]) 03/12/2015  . Gallstones 03/12/2015  . Nausea without vomiting 12/10/2014  . Essential hypertension 05/29/2014  . 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 Agent resistant to multiple antibiotics 05/29/2013  . Low back pain radiating to right leg 01/16/2013  . Sinus problem 11/20/2012  . Chronic headaches morning 10/19/2012  . Low sodium levels  133 10/19/2012  . Leg pain, posterior 08/16/2012  . Sciatic neuritis 08/16/2012  . Sleep disturbance, unspecified 08/16/2012  . Medication management 03/24/2012  . Preventative health care 09/24/2011  . Trigeminal neuralgia 09/24/2011  . Postmenopausal HRT (hormone replacement therapy) 09/24/2011  . Polycythemia 09/20/2011  . Morning headache 02/01/2011  . Hearing aid worn   . Abnormal blood finding   . LOCALIZED SUPERFICIAL SWELLING MASS OR LUMP 09/21/2009  . DYSPNEA 05/07/2008  . Hypnic headache 03/19/2008  . 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 Headache(784.0) 06/05/2007  . JAW PAIN 04/06/2007  . OSTEOARTHRITIS 04/06/2007  . ABNORMAL RESULT, FUNCTION STUDY, [place] 02/05/2007  . HYPERLIPIDEMIA 12/25/2006  . HYPERTENSION 12/25/2006    Past Surgical History:  Procedure Laterality Date  . ABDOMINAL HYSTERECTOMY  2002   tubal  .  CARDIAC CATHETERIZATION  2000   chest pains neg  . CHOLECYSTECTOMY N/A 02/21/2017   Procedure: LAPAROSCOPIC CHOLECYSTECTOMY WITH INTRAOPERATIVE CHOLANGIOGRAM;  Surgeon: Armandina Gemma, MD;  Location: [facility];  Service: General;  Laterality: N/A;  . COLONOSCOPY     multiple  . CRANIOTOMY  12/09/2011   nerve decompression right trigeminal   . DOPPLER ECHOCARDIOGRAPHY  2009   nl lv function mild lv dilitation  . EYE SURGERY Bilateral    ioc for catatracts  . laparoscopic gallbladder surgery  02/16/2017   Fax from [facility]  . OOPHORECTOMY Bilateral 2002  . rt shoulder surgery      Current Outpatient Medications  Medication Sig Dispense Refill  . Acetaminophen (TYLENOL PO) Take 2 tablets by mouth as needed.    Marland Kitchen albuterol (PROVENTIL HFA;VENTOLIN HFA) 108 (90 Base) MCG/ACT inhaler Inhale 2 puffs into the lungs every 6 (six) hours as needed. 1 Inhaler 2  . alendronate (FOSAMAX) 70 MG tablet Take 1 tablet (70 mg total) by mouth every 7 (seven) days. Take with a full glass of water on an empty stomach. 4 tablet 11  . amLODipine (NORVASC) 10 MG tablet TAKE ONE TABLET BY MOUTH ONE TIME DAILY  90 tablet 0  . benazepril (LOTENSIN) 20 MG tablet Take 20 mg by mouth daily.    . budesonide-formoterol (SYMBICORT) 160-4.5 MCG/ACT inhaler Inhale 2 puffs into the lungs 2 (two) times daily. 1 Inhaler 2  . butalbital-aspirin-caffeine-codeine (ASCOMP-CODEINE) 50-325-40-30 MG capsule TAKE 1-2 CAPSULES BY MOUTH EVERY FOUR TO SIX HOURS AS NEEDED FOR MIGRAINE RESCUE,MAX OF 6 CAPSULES IN 24 HOURS 30 capsule 0  . chlorthalidone (HYGROTON) 25 MG tablet Take 1 tablet (25 mg total) by mouth daily. (Patient taking differently: Take 12.5 mg by mouth daily. ) 90 tablet 0  . conjugated estrogens (PREMARIN) vaginal cream Place 1 Applicatorful vaginally 2 (two) times a week.    . fluticasone (FLONASE) 50 MCG/ACT nasal spray Place 2 sprays into both nostrils as needed for allergies or rhinitis.     Marland Kitchen omeprazole  (PRILOSEC) 20 MG capsule Take 20 mg by mouth daily.    . Oxcarbazepine (TRILEPTAL) 300 MG tablet Take 600 mg by mouth 2 (two)  times daily.    . RESTASIS 0.05 % ophthalmic emulsion Place 1 drop into both eyes 2 (two) times daily.   1  . baclofen (LIORESAL) 10 MG tablet Take 0.5-1 tablets (5-10 mg total) by mouth 3 (three) times daily. 90 each 1  . DULoxetine (CYMBALTA) 30 MG capsule Take 1 capsule (30 mg total) by mouth daily. For 7 nights then take 2 capsules daily at night (Patient not taking: Reported on 07/08/2019) 60 capsule 1   No current facility-administered medications for this visit.    Allergies as of 07/08/2019 - Review Complete 07/08/2019  Allergen Reaction Noted  . Hydrocodone Other (See Comments) 11/24/2015  . Oxycodone Other (See Comments) 11/24/2015  . Sulfamethoxazole-trimethoprim Other (See Comments)   . Sulfur Hives and Itching 07/08/2019    Vitals: BP 126/77 (BP Location: Left Arm, Patient Position: Sitting)   Pulse 74   Temp (!) 97.5 F (36.4 C)   Ht 5' 6"  (1.676 m)   Wt 132 lb (59.9 kg)   BMI 21.31 kg/m  Last Weight:  Wt Readings from Last 1 Encounters:  07/08/19 132 lb (59.9 kg)   Last Height:   Ht Readings from Last 1 Encounters:  07/08/19 5' 6"  (1.676 m)     Physical exam: Exam: Gen: NAD, conversant, well nourised, well groomed                     CV: RRR, no MRG. No Carotid Bruits. No peripheral edema, warm, nontender Eyes: Conjunctivae clear without exudates or hemorrhage  Neuro: Detailed Neurologic Exam  Speech:    Speech is normal; fluent and spontaneous with normal comprehension.  Cognition:    The patient is oriented to person, place, and time;     recent and remote memory intact;     language fluent;     normal attention, concentration,     fund of knowledge Cranial Nerves:    The pupils are equal, round, and reactive to light. Could not visualize fundi Visual fields are full to finger confrontation. Extraocular movements are  intact. Trigeminal sensation is intact and the muscles of mastication are normal. The face is symmetric. The palate elevates in the midline. Hearing intact. Voice is normal. Shoulder shrug is normal. The tongue has normal motion without fasciculations.   Coordination:    Normal finger to nose and heel to shin. Normal rapid alternating movements.   Gait:    Not atxic, slightly antalgic  Motor Observation:    No asymmetry, no atrophy, and no involuntary movements noted. Tone:    Normal muscle tone.    Posture:    Posture is normal. normal erect    Strength:    Strength is V/V in the upper and lower limbs.      Sensation: intact to LT     Reflex Exam:  DTR's:    Deep tendon reflexes in the upper and lower extremities are symmetrical bilaterally.   Toes:    The toes are downgoing bilaterally.   Clonus:    Clonus is absent.    Assessment/Plan:  79 year old femle with severe pain. She has suggered with trigeminal neuralgia ([name]) since 2008, in 2013 had microvascular decompression at [facility]. Very difficult case, patient had hyponatremia with Tegretol, already on a high dose of Trileptal which was recently increased so may take time. Adding another anti-epilepsy medication or baclofen is a possibility but I do worry about side effects of having multiple AEDs or baclofen in an 79 year  old. I have contacted Dr. Vertell Limber to see if Dr. Maryjean Ka can perform any interventions. Will try nerve block today. I'ver tried botox with other patients in the past with poor results, and risk of affecting CN 7, in the past. Discussed with Dr. Vertell Limber about his team performing gamma knife, balloon, rhizotomy or others. Discussed at length with patient. I do not think she needs repeat imaging, symptoms had not changed, no new symptoms or red flags.  Hyponatremia with Tegretol, on 1267m daily Trileptal, other medications with evidence include: baclofen,Gabapentin, lamictal, lyrica, baclofen, botox  Performed a  peripheral nerve block today, unfortunately affected CN 7 but did relieve her [name] severe pain, advised to monitor to keep eye lubricated(drying out of cornea can cause blindness) and also wear sunglasses until at least her blink reflex returns, no vision changes she can drive.  Performed by Dr. AJaynee EaglesM.D. . All procedures a documented blood were medically necessary, reasonable and appropriate based on the patient's history, medical diagnosis and physician opinion. Verbal informed consent was obtained from the patient, patient was informed of potential risk of procedure, including bruising, bleeding, hematoma formation, infection, muscle weakness, muscle pain, numbness, transient hypertension, transient hyperglycemia and transient insomnia among others. All areas injected were topically clean with isopropyl rubbing alcohol. Nonsterile nonlatex gloves were worn during the procedure.  Trigeminal nerve block (64400): Trigeminal nerve site was identified along the  Medication was injected into the right trigeminal nerve areas. Patient's condition is associated with inflammation of the trigeminal nerve and associated muscle groups. Injection was deemed medically necessary, reasonable and appropriate. Injection represents a separate and unique surgical service.   Meds ordered this encounter  Medications  . baclofen (LIORESAL) 10 MG tablet    Sig: Take 0.5-1 tablets (5-10 mg total) by mouth 3 (three) times daily.    Dispense:  90 each    Refill:  1    Cc: [name], WStandley Brooking MD, JErline Levine MD  A total of 60 minutes was spent on this patient's care, reviewing imaging, past records, recent hospitalization notes and results. Over half this time was spent on counseling patient on the  1. Trigeminal neuralgia of right side of face    diagnosis and different diagnostic and therapeutic options, counseling and coordination of care, risks and benefitsof management, compliance, or risk factor reduction and  education.  This does not include time spent on nerve block.   ASarina Ill MD  [facility] [address] [id number] Phone [phone] [phone]

## 2019-07-08 NOTE — Telephone Encounter (Signed)
Informed patient of message.

## 2019-07-11 ENCOUNTER — Encounter: Payer: Self-pay | Admitting: Physical Medicine and Rehabilitation

## 2019-07-17 DIAGNOSIS — Z1231 Encounter for screening mammogram for malignant neoplasm of breast: Secondary | ICD-10-CM | POA: Diagnosis not present

## 2019-07-17 LAB — HM MAMMOGRAPHY

## 2019-07-25 ENCOUNTER — Encounter: Payer: Self-pay | Admitting: Internal Medicine

## 2019-08-01 ENCOUNTER — Telehealth: Payer: Self-pay | Admitting: Internal Medicine

## 2019-08-01 ENCOUNTER — Telehealth: Payer: Medicare Other | Admitting: Family Medicine

## 2019-08-01 ENCOUNTER — Encounter: Payer: Self-pay | Admitting: Family Medicine

## 2019-08-01 DIAGNOSIS — Z538 Procedure and treatment not carried out for other reasons: Secondary | ICD-10-CM

## 2019-08-01 DIAGNOSIS — A084 Viral intestinal infection, unspecified: Secondary | ICD-10-CM | POA: Diagnosis not present

## 2019-08-01 NOTE — Telephone Encounter (Signed)
Pt has been scheduled for a virtual visit with Dr.Kim

## 2019-08-01 NOTE — Telephone Encounter (Signed)
Pt would like a call back she is vomiting has not been eating and is losing weight!  She is worried.

## 2019-08-01 NOTE — Progress Notes (Signed)
A user error has taken place: Connected via telephone for a telemedicine visit, however, on intake realized [place]. Patient reported she will go to a local urgent care there for care and this telemedicine visit was canceled.

## 2019-08-05 ENCOUNTER — Telehealth: Payer: Self-pay | Admitting: *Deleted

## 2019-08-05 ENCOUNTER — Other Ambulatory Visit: Payer: Self-pay

## 2019-08-05 DIAGNOSIS — I10 Essential (primary) hypertension: Secondary | ICD-10-CM | POA: Diagnosis not present

## 2019-08-05 DIAGNOSIS — G500 Trigeminal neuralgia: Secondary | ICD-10-CM | POA: Diagnosis not present

## 2019-08-05 MED ORDER — BENAZEPRIL HCL 20 MG PO TABS: 20.0000 mg | ORAL_TABLET | Freq: Every day | ORAL | 1 refills | Status: DC

## 2019-08-05 NOTE — Telephone Encounter (Signed)
Rx refilled.

## 2019-08-05 NOTE — Telephone Encounter (Signed)
Patient called after hours 08/03/2019. Patient reports she has prescription that he needs refills. It is out of refills and she is changing pharmacies. The name of the medication is Benazepril 20 mg 1 x a day.She has about 6 left. She is changing pharmacies. Needs it sent to [place] on [address].

## 2019-08-07 ENCOUNTER — Ambulatory Visit: Payer: Self-pay

## 2019-08-07 ENCOUNTER — Other Ambulatory Visit: Payer: Self-pay

## 2019-08-07 ENCOUNTER — Encounter: Payer: Self-pay | Admitting: Orthopaedic Surgery

## 2019-08-07 ENCOUNTER — Ambulatory Visit: Payer: Medicare Other | Admitting: Orthopaedic Surgery

## 2019-08-07 DIAGNOSIS — M25512 Pain in left shoulder: Secondary | ICD-10-CM

## 2019-08-07 MED ORDER — METHYLPREDNISOLONE ACETATE 40 MG/ML IJ SUSP
40.0000 mg | INTRAMUSCULAR | Status: AC | PRN
  Administered 2019-08-07: 40 mg via INTRA_ARTICULAR

## 2019-08-07 MED ORDER — LIDOCAINE HCL 1 % IJ SOLN
3.0000 mL | INTRAMUSCULAR | Status: AC | PRN
Start: 2019-08-07 — End: 2019-08-07
  Administered 2019-08-07: 3 mL

## 2019-08-07 NOTE — Progress Notes (Signed)
Office Visit Note   Patient: [name]           Date of Birth: February 22, 1941           MRN: [id number] Visit Date: 08/07/2019              Requested by: [name], MD [place],  [place] [id number] PCP: [name], MD   Assessment & Plan: Visit Diagnoses:  1. Acute pain of left shoulder     Plan: I gave the patient reassurance that I did not see any fracture of the shoulder.  I did recommend a subacromial steroid injection to see if this would help with her pain and she tolerated this well.  She can also try Voltaren gel and oral anti-inflammatories.  I gave her reassurance that things should hopefully feel better with time.  All question concerns were answered and addressed.  Follow-up can be as needed.  Follow-Up Instructions: Return if symptoms worsen or fail to improve.   Orders:  Orders Placed This Encounter  Procedures  . Large Joint Inj  . XR Shoulder Left   No orders of the defined types were placed in this encounter.     Procedures: Large Joint Inj: L subacromial bursa on 08/07/2019 10:49 AM Indications: pain and diagnostic evaluation Details: 22 G 1.5 in needle  Arthrogram: No  Medications: 3 mL lidocaine 1 %; 40 mg methylPREDNISolone acetate 40 MG/ML Outcome: tolerated well, no immediate complications Procedure, treatment alternatives, risks and benefits explained, specific risks discussed. Consent was given by the patient. Immediately prior to procedure a time out was called to verify the correct patient, procedure, equipment, support staff and site/side marked as required. Patient was prepped and draped in the usual sterile fashion.       Clinical Data: No additional findings.   Subjective: Chief Complaint  Patient presents with  . Left Shoulder - Pain  The patient is an 79 year old female who comes in today for evaluation treatment of left shoulder pain that is been hurting for the last 2 weeks after she  sustained a mechanical fall when she was at the beach.  She is very worried about the the shoulder potentially being broken.  She has had painful range of motion of that shoulder ever since she fell.  She has never had surgery on the shoulder before.  She is also dealing with some neuralgia type of pain that is unrelated.  She states that she is having difficulty reaching behind her and reaching overhead with the left shoulder.  HPI  Review of Systems She currently denies any chest pain, shortness of breath, fever, chills, nausea, vomiting  Objective: Vital Signs: There were no vitals taken for this visit.  Physical Exam She is alert and orient x3 and in no acute distress Ortho Exam Examination of her left shoulder shows that it is clinically well located.  She is able to abduct her shoulder but it is painful to do so.  Her external rotation is only slightly limited.  Distally her motor and sensory exam is normal and there is no issues from the elbow down to the hand and wrist.:  No specialty comments available.  Imaging: XR Shoulder Left  Result Date: 08/07/2019 3 views of the left shoulder show no acute findings.  There is no evidence of fracture.  The shoulder is well located.    PMFS History: Patient Active Problem List   Diagnosis Date Noted  . Spondylosis without  myelopathy or radiculopathy, lumbar region 03/27/2019  . Radiculopathy due to lumbar intervertebral disc disorder 03/27/2019  . 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 Diarrhea 11/27/2017  . Closed head injury 11/27/2015  . Unilateral headache 05/26/2015  . ARF (acute renal failure) ([place]) 03/12/2015  . Gallstones 03/12/2015  . Nausea without vomiting 12/10/2014  . Essential hypertension 05/29/2014  . 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 Agent resistant to multiple antibiotics 05/29/2013  . Low back pain radiating to right leg 01/16/2013  . Sinus problem 11/20/2012  . Chronic  headaches morning 10/19/2012  . Low sodium levels  133 10/19/2012  . Leg pain, posterior 08/16/2012  . Sciatic neuritis 08/16/2012  . Sleep disturbance, unspecified 08/16/2012  . Medication management 03/24/2012  . Preventative health care 09/24/2011  . Trigeminal neuralgia 09/24/2011  . Postmenopausal HRT (hormone replacement therapy) 09/24/2011  . Polycythemia 09/20/2011  . Morning headache 02/01/2011  . Hearing aid worn   . Abnormal blood finding   . LOCALIZED SUPERFICIAL SWELLING MASS OR LUMP 09/21/2009  . DYSPNEA 05/07/2008  . Hypnic headache 03/19/2008  . 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 Headache(784.0) 06/05/2007  . JAW PAIN 04/06/2007  . OSTEOARTHRITIS 04/06/2007  . ABNORMAL RESULT, FUNCTION STUDY, [place] 02/05/2007  . HYPERLIPIDEMIA 12/25/2006  . HYPERTENSION 12/25/2006   Past Medical History:  Diagnosis Date  . Abdominal pain 05/29/2013   s/p rx of cephalo resistant e coli   but last rx NG  now residular ?  bladder sx repeat cx sx rx to ty and uro consult   . ADJ DISORDER WITH MIXED ANXIETY & DEPRESSED MOOD 03/03/2010   Qualifier: Diagnosis of  By: [name], [name]   . Closed head injury 02/01/2011   from syncope and had scalp laceration  neg ct .    Marland Kitchen Closed head injury 5-6 yrs ago  . Colitis 11/27/2017  . Complication of anesthesia    migraine several hours after general anesthesia  . Fatty liver   . Gall stones 2016   see ct scan neg HIDA   . GERD (gastroesophageal reflux disease)   . Hearing aid worn   . HOH (hard of hearing)    both ears  . Hyperlipidemia   . Hypertension    echo nl lv function  mild dilitation 2009  . Kidney infection    few yrs ago in hospital  . Medication side effect 09/02/2010   Poss muscle se of 10 crestor   . Migraine    hypnic HA eval by Dr. Earley Favor in the past  . Polycythemia   . 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 Pyelonephritis 03/12/2015  . Sensation of pain in anesthetized distribution of trigeminal  nerve   . Syncope 02/01/2011   In shower on vacation  sustained head laceration  8 sutures Had ed visit neg head ct labs and x ray   . Trigeminal neuralgia pain     Family History  Problem Relation Age of Onset  . Ovarian cancer Mother   . Stroke Mother   . Alcohol abuse Father   . Stroke Father   . Diabetes Brother   . Cancer Paternal Aunt        leukemia, unknown type  . Seizures Daughter   . Hypertension Other   . Colon cancer Neg Hx     Past Surgical History:  Procedure Laterality Date  . ABDOMINAL HYSTERECTOMY  2002   tubal  . CARDIAC CATHETERIZATION  2000  chest pains neg  . CHOLECYSTECTOMY N/A 02/21/2017   Procedure: LAPAROSCOPIC CHOLECYSTECTOMY WITH INTRAOPERATIVE CHOLANGIOGRAM;  Surgeon: Armandina Gemma, MD;  Location: [facility];  Service: General;  Laterality: N/A;  . COLONOSCOPY     multiple  . CRANIOTOMY  12/09/2011   nerve decompression right trigeminal   . DOPPLER ECHOCARDIOGRAPHY  2009   nl lv function mild lv dilitation  . EYE SURGERY Bilateral    ioc for catatracts  . laparoscopic gallbladder surgery  02/16/2017   Fax from [facility]  . OOPHORECTOMY Bilateral 2002  . rt shoulder surgery     Social History   Occupational History    Comment: retired Forensic psychologist  Tobacco Use  . Smoking status: Never Smoker  . Smokeless tobacco: Never Used  Substance and Sexual Activity  . Alcohol use: Yes    Alcohol/week: 2.0 standard drinks    Types: 2 Glasses of wine per week    Comment: occ wine  . Drug use: No  . Sexual activity: Not on file

## 2019-08-10 ENCOUNTER — Other Ambulatory Visit: Payer: Self-pay

## 2019-08-10 ENCOUNTER — Encounter (HOSPITAL_COMMUNITY): Payer: Self-pay

## 2019-08-10 ENCOUNTER — Emergency Department (HOSPITAL_COMMUNITY): Payer: Medicare Other

## 2019-08-10 ENCOUNTER — Emergency Department (HOSPITAL_COMMUNITY)
Admission: EM | Admit: 2019-08-10 | Discharge: 2019-08-10 | Disposition: A | Discharge status: Home / Self Care | Payer: Medicare Other | Attending: Emergency Medicine | Admitting: Emergency Medicine

## 2019-08-10 DIAGNOSIS — S0083XA Contusion of other part of head, initial encounter: Secondary | ICD-10-CM | POA: Insufficient documentation

## 2019-08-10 DIAGNOSIS — S50311A Abrasion of right elbow, initial encounter: Secondary | ICD-10-CM | POA: Diagnosis not present

## 2019-08-10 DIAGNOSIS — I10 Essential (primary) hypertension: Secondary | ICD-10-CM | POA: Insufficient documentation

## 2019-08-10 DIAGNOSIS — W0110XA Fall on same level from slipping, tripping and stumbling with subsequent striking against unspecified object, initial encounter: Secondary | ICD-10-CM | POA: Diagnosis not present

## 2019-08-10 DIAGNOSIS — S42001A Fracture of unspecified part of right clavicle, initial encounter for closed fracture: Secondary | ICD-10-CM

## 2019-08-10 DIAGNOSIS — R52 Pain, unspecified: Secondary | ICD-10-CM | POA: Diagnosis not present

## 2019-08-10 DIAGNOSIS — Y939 Activity, unspecified: Secondary | ICD-10-CM | POA: Insufficient documentation

## 2019-08-10 DIAGNOSIS — Y92018 Other place in single-family (private) house as the place of occurrence of the external cause: Secondary | ICD-10-CM | POA: Diagnosis not present

## 2019-08-10 DIAGNOSIS — S0990XA Unspecified injury of head, initial encounter: Secondary | ICD-10-CM | POA: Diagnosis not present

## 2019-08-10 DIAGNOSIS — Z9049 Acquired absence of other specified parts of digestive tract: Secondary | ICD-10-CM | POA: Diagnosis not present

## 2019-08-10 DIAGNOSIS — S4991XA Unspecified injury of right shoulder and upper arm, initial encounter: Secondary | ICD-10-CM | POA: Diagnosis present

## 2019-08-10 DIAGNOSIS — S199XXA Unspecified injury of neck, initial encounter: Secondary | ICD-10-CM | POA: Diagnosis not present

## 2019-08-10 DIAGNOSIS — G500 Trigeminal neuralgia: Secondary | ICD-10-CM | POA: Insufficient documentation

## 2019-08-10 DIAGNOSIS — W19XXXA Unspecified fall, initial encounter: Secondary | ICD-10-CM | POA: Diagnosis not present

## 2019-08-10 DIAGNOSIS — Z79899 Other long term (current) drug therapy: Secondary | ICD-10-CM | POA: Insufficient documentation

## 2019-08-10 DIAGNOSIS — S42031A Displaced fracture of lateral end of right clavicle, initial encounter for closed fracture: Secondary | ICD-10-CM | POA: Diagnosis not present

## 2019-08-10 DIAGNOSIS — Y999 Unspecified external cause status: Secondary | ICD-10-CM | POA: Diagnosis not present

## 2019-08-10 DIAGNOSIS — S59901A Unspecified injury of right elbow, initial encounter: Secondary | ICD-10-CM | POA: Diagnosis not present

## 2019-08-10 DIAGNOSIS — S42024A Nondisplaced fracture of shaft of right clavicle, initial encounter for closed fracture: Secondary | ICD-10-CM | POA: Diagnosis not present

## 2019-08-10 DIAGNOSIS — Y92009 Unspecified place in unspecified non-institutional (private) residence as the place of occurrence of the external cause: Secondary | ICD-10-CM

## 2019-08-10 LAB — CARBAMAZEPINE LEVEL, TOTAL: Carbamazepine Lvl: <2 ug/mL — ABNORMAL LOW (ref 4.0–12.0)

## 2019-08-10 NOTE — Discharge Instructions (Signed)
Apply ice for thirty minutes at a time, four times a day.  Take ibuprofen or naproxen as needed for pain.  For additional pain relief, take acetaminophen as needed.

## 2019-08-10 NOTE — ED Triage Notes (Signed)
From home. Fall in laundry room. C/O right shoulder and right forehead pain. No LOC. Several previous falls in last weeks.

## 2019-08-10 NOTE — ED Provider Notes (Signed)
[facility] DEPT Provider Note   CSN: [id number] Arrival date & time: 08/10/19  0426   History Chief Complaint  Patient presents with  . Shoulder Pain    [name] is a 79 y.o. female.  The history is provided by the patient.  Shoulder Pain She has history of hypertension, hyperlipidemia, polycythemia, trigeminal neuralgia and comes in following a fall at home.  She got up to use the bathroom and lost her balance and fell.  She suffered a bump to the the right forehead and she is complaining of pain in her right shoulder.  Several weeks ago, she had fallen and injured her left shoulder but x-rays did not show any fracture.  She denies loss of consciousness.  She denies dizziness, nausea, vomiting.  He is denies neck or back pain.  She did suffer an abrasion to her right elbow.  Of note, she is taking oxcarbazepine for trigeminal neuralgia and knows that it does affect her balance.  Past Medical History:  Diagnosis Date  . Abdominal pain 05/29/2013   s/p rx of cephalo resistant e coli   but last rx NG  now residular ?  bladder sx repeat cx sx rx to ty and uro consult   . ADJ DISORDER WITH MIXED ANXIETY & DEPRESSED MOOD 03/03/2010   Qualifier: Diagnosis of  By: [name], [name]   . Closed head injury 02/01/2011   from syncope and had scalp laceration  neg ct .    Marland Kitchen Closed head injury 5-6 yrs ago  . Colitis 11/27/2017  . Complication of anesthesia    migraine several hours after general anesthesia  . Fatty liver   . Gall stones 2016   see ct scan neg HIDA   . GERD (gastroesophageal reflux disease)   . Hearing aid worn   . HOH (hard of hearing)    both ears  . Hyperlipidemia   . Hypertension    echo nl lv function  mild dilitation 2009  . Kidney infection    few yrs ago in hospital  . Medication side effect 09/02/2010   Poss muscle se of 10 crestor   . Migraine    hypnic HA eval by Dr. Earley Favor in the past  . Polycythemia   . 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 Pyelonephritis 03/12/2015  . 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 Syncope 02/01/2011   In shower on vacation  sustained head laceration  8 sutures Had ed visit neg head ct labs and x ray   . Trigeminal neuralgia pain     Patient Active Problem List   Diagnosis Date Noted  . 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 Radiculopathy due to lumbar intervertebral disc disorder 03/27/2019  . 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 Diarrhea 11/27/2017  . Closed head injury 11/27/2015  . Unilateral headache 05/26/2015  . ARF (acute renal failure) ([place]) 03/12/2015  . Gallstones 03/12/2015  . Nausea without vomiting 12/10/2014  . Essential hypertension 05/29/2014  . 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 Agent resistant to multiple antibiotics 05/29/2013  . Low back pain radiating to right leg 01/16/2013  . Sinus problem 11/20/2012  . Chronic headaches morning 10/19/2012  . Low sodium levels  133 10/19/2012  . Leg pain, posterior 08/16/2012  . Sciatic neuritis 08/16/2012  . Sleep disturbance, unspecified 08/16/2012  . Medication management 03/24/2012  . Preventative health  care 09/24/2011  . Trigeminal neuralgia 09/24/2011  . Postmenopausal HRT (hormone replacement therapy) 09/24/2011  . Polycythemia 09/20/2011  . Morning headache 02/01/2011  . Hearing aid worn   . Abnormal blood finding   . LOCALIZED SUPERFICIAL SWELLING MASS OR LUMP 09/21/2009  . DYSPNEA 05/07/2008  . Hypnic headache 03/19/2008  . 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 Headache(784.0) 06/05/2007  . JAW PAIN 04/06/2007  . OSTEOARTHRITIS 04/06/2007  . ABNORMAL RESULT, FUNCTION STUDY, [place] 02/05/2007  . HYPERLIPIDEMIA 12/25/2006  . HYPERTENSION 12/25/2006    Past Surgical History:  Procedure Laterality Date  . ABDOMINAL HYSTERECTOMY  2002   tubal  . CARDIAC  CATHETERIZATION  2000   chest pains neg  . CHOLECYSTECTOMY N/A 02/21/2017   Procedure: LAPAROSCOPIC CHOLECYSTECTOMY WITH INTRAOPERATIVE CHOLANGIOGRAM;  Surgeon: Armandina Gemma, MD;  Location: [facility];  Service: General;  Laterality: N/A;  . COLONOSCOPY     multiple  . CRANIOTOMY  12/09/2011   nerve decompression right trigeminal   . DOPPLER ECHOCARDIOGRAPHY  2009   nl lv function mild lv dilitation  . EYE SURGERY Bilateral    ioc for catatracts  . laparoscopic gallbladder surgery  02/16/2017   Fax from [facility]  . OOPHORECTOMY Bilateral 2002  . rt shoulder surgery       OB History    Gravida  2   Para  2   Term      Preterm      AB      Living        SAB      TAB      Ectopic      Multiple      Live Births              Family History  Problem Relation Age of Onset  . Ovarian cancer Mother   . Stroke Mother   . Alcohol abuse Father   . Stroke Father   . Diabetes Brother   . Cancer Paternal Aunt        leukemia, unknown type  . Seizures Daughter   . Hypertension Other   . Colon cancer Neg Hx     Social History   Tobacco Use  . Smoking status: Never Smoker  . Smokeless tobacco: Never Used  Substance Use Topics  . Alcohol use: Yes    Alcohol/week: 2.0 standard drinks    Types: 2 Glasses of wine per week    Comment: occ wine  . Drug use: No    Home Medications Prior to Admission medications   Medication Sig Start Date End Date Taking? Authorizing Provider  Acetaminophen (TYLENOL PO) Take 2 tablets by mouth as needed.    [provider]  albuterol (PROVENTIL HFA;VENTOLIN HFA) 108 (90 Base) MCG/ACT inhaler Inhale 2 puffs into the lungs every 6 (six) hours as needed. 04/26/16   [name], [name], MD  alendronate (FOSAMAX) 70 MG tablet Take 1 tablet (70 mg total) by mouth every 7 (seven) days. Take with a full glass of water on an empty stomach. 06/14/19   [name], [name], MD  amLODipine (NORVASC) 10 MG tablet TAKE ONE TABLET BY  MOUTH ONE TIME DAILY  05/27/19   [name], [name], MD  baclofen (LIORESAL) 10 MG tablet Take 0.5-1 tablets (5-10 mg total) by mouth 3 (three) times daily. 07/08/19   [name], MD  benazepril (LOTENSIN) 20 MG tablet Take 1 tablet (20 mg total) by mouth daily. 08/05/19   [name], [name],  MD  budesonide-formoterol (SYMBICORT) 160-4.5 MCG/ACT inhaler Inhale 2 puffs into the lungs 2 (two) times daily. 07/16/15   [name], [name], MD  butalbital-aspirin-caffeine-codeine (ASCOMP-CODEINE) [phone] MG capsule TAKE 1-2 CAPSULES BY MOUTH EVERY FOUR TO SIX HOURS AS NEEDED FOR MIGRAINE RESCUE,MAX OF 6 CAPSULES IN 24 HOURS 03/04/19   [name], [name], MD  chlorthalidone (HYGROTON) 25 MG tablet Take 1 tablet (25 mg total) by mouth daily. Patient taking differently: Take 12.5 mg by mouth daily.  06/21/19   [name], [name], MD  conjugated estrogens (PREMARIN) vaginal cream Place 1 Applicatorful vaginally 2 (two) times a week.    [provider]  DULoxetine (CYMBALTA) 30 MG capsule Take 1 capsule (30 mg total) by mouth daily. For 7 nights then take 2 capsules daily at night 03/14/19   Magnus Sinning, MD  fluticasone [facility]) 50 MCG/ACT nasal spray Place 2 sprays into both nostrils as needed for allergies or rhinitis.     [provider]  omeprazole (PRILOSEC) 20 MG capsule Take 20 mg by mouth daily.    [provider]  OVER THE COUNTER MEDICATION OTC eye drops    [provider]  Oxcarbazepine (TRILEPTAL) 300 MG tablet Take 600 mg by mouth 2 (two) times daily. 07/05/19   [provider]    Allergies    Hydrocodone, Oxycodone, Sulfamethoxazole-trimethoprim, and Sulfur  Review of Systems   Review of Systems  All other systems reviewed and are negative.   Physical Exam Updated Vital Signs BP (!) 163/85 (BP Location: Right Arm)   Pulse 76   Temp 97.9 F (36.6 C) (Oral)   Resp 18   Ht 5' 6"  (1.676 m)   Wt 55.8 kg   SpO2 100%   BMI 19.85 kg/m   Physical  Exam Vitals and nursing note reviewed.   79 year old female, resting comfortably and in no acute distress. Vital signs are significant for elevated blood pressure. Oxygen saturation is 100%, which is normal. Head is normocephalic.  Moderate size hematoma present on the right side of the forehead. PERRLA, EOMI. Oropharynx is clear. Neck is nontender without adenopathy or JVD. Back is nontender and there is no CVA tenderness. Lungs are clear without rales, wheezes, or rhonchi. Chest is nontender. Heart has regular rate and rhythm without murmur. Abdomen is soft, flat, nontender without masses or hepatosplenomegaly and peristalsis is normoactive. Extremities have 1+ edema.  There is moderate tenderness to palpation in the right shoulder without any obvious deformity.  Pain is elicited with passive range of motion.  Abrasions noted over the right elbow without deformity.  Distal neurovascular exam is intact. Skin is warm and dry without rash. Neurologic: Mental status is normal, cranial nerves are intact, there are no motor or sensory deficits.  ED Results / Procedures / Treatments   Labs (all labs ordered are listed, but only abnormal results are displayed) Labs Reviewed  CARBAMAZEPINE LEVEL, TOTAL   Radiology DG Elbow Complete Right  Result Date: 08/10/2019 CLINICAL DATA:  Pain after fall EXAM: RIGHT ELBOW - COMPLETE 3+ VIEW COMPARISON:  None. FINDINGS: There is no evidence of fracture, dislocation, or joint effusion. There is no evidence of arthropathy or other focal bone abnormality. Soft tissues are unremarkable. IMPRESSION: Negative. Electronically Signed   By: Dorise Bullion III M.D   On: 08/10/2019 05:38   CT Head Wo Contrast  Result Date: 08/10/2019 CLINICAL DATA:  Fall EXAM: CT HEAD WITHOUT CONTRAST CT CERVICAL SPINE WITHOUT CONTRAST TECHNIQUE: Multidetector CT imaging of the  head and cervical spine was performed following the standard protocol without intravenous contrast.  Multiplanar CT image reconstructions of the cervical spine were also generated. COMPARISON:  None. FINDINGS: CT HEAD FINDINGS Brain: There is no mass, hemorrhage or extra-axial collection. The size and configuration of the ventricles and extra-axial CSF spaces are normal. The brain parenchyma is normal, without evidence of acute or chronic infarction. Vascular: No abnormal hyperdensity of the major intracranial arteries or dural venous sinuses. No intracranial atherosclerosis. Skull: Right frontoparietal scalp hematoma. No skull fracture. Old right occipital craniectomy defect. Sinuses/Orbits: No fluid levels or advanced mucosal thickening of the visualized paranasal sinuses. No mastoid or middle ear effusion. The orbits are normal. CT CERVICAL SPINE FINDINGS Alignment: Reversal of normal cervical lordosis may be positional or due to muscle spasm. Skull base and vertebrae: No acute fracture. Soft tissues and spinal canal: No prevertebral fluid or swelling. No visible canal hematoma. Disc levels: No advanced spinal canal or neural foraminal stenosis. Upper chest: No pneumothorax, pulmonary nodule or pleural effusion. Other: Normal visualized paraspinal cervical soft tissues. IMPRESSION: 1. No acute intracranial abnormality. 2. Right frontoparietal scalp hematoma without skull fracture. 3. No acute fracture or static subluxation of the cervical spine. Electronically Signed   By: Ulyses Jarred M.D.   On: 08/10/2019 06:04   CT Cervical Spine Wo Contrast  Result Date: 08/10/2019 CLINICAL DATA:  Fall EXAM: CT HEAD WITHOUT CONTRAST CT CERVICAL SPINE WITHOUT CONTRAST TECHNIQUE: Multidetector CT imaging of the head and cervical spine was performed following the standard protocol without intravenous contrast. Multiplanar CT image reconstructions of the cervical spine were also generated. COMPARISON:  None. FINDINGS: CT HEAD FINDINGS Brain: There is no mass, hemorrhage or extra-axial collection. The size and configuration  of the ventricles and extra-axial CSF spaces are normal. The brain parenchyma is normal, without evidence of acute or chronic infarction. Vascular: No abnormal hyperdensity of the major intracranial arteries or dural venous sinuses. No intracranial atherosclerosis. Skull: Right frontoparietal scalp hematoma. No skull fracture. Old right occipital craniectomy defect. Sinuses/Orbits: No fluid levels or advanced mucosal thickening of the visualized paranasal sinuses. No mastoid or middle ear effusion. The orbits are normal. CT CERVICAL SPINE FINDINGS Alignment: Reversal of normal cervical lordosis may be positional or due to muscle spasm. Skull base and vertebrae: No acute fracture. Soft tissues and spinal canal: No prevertebral fluid or swelling. No visible canal hematoma. Disc levels: No advanced spinal canal or neural foraminal stenosis. Upper chest: No pneumothorax, pulmonary nodule or pleural effusion. Other: Normal visualized paraspinal cervical soft tissues. IMPRESSION: 1. No acute intracranial abnormality. 2. Right frontoparietal scalp hematoma without skull fracture. 3. No acute fracture or static subluxation of the cervical spine. Electronically Signed   By: Ulyses Jarred M.D.   On: 08/10/2019 06:04   DG Shoulder Right Port  Result Date: 08/10/2019 CLINICAL DATA:  Pain after fall EXAM: PORTABLE RIGHT SHOULDER COMPARISON:  Chest x-ray April 11, 2018 FINDINGS: Degenerative changes at the rotator cuff insertion site. No fracture in the scapula or proximal humerus. A fracture of the distal clavicle is identified. A chronic fracture was seen in this location on April 11, 2018. Some of the fracture lines appear sharper and more discrete suggesting the possibility of an acute on chronic fracture in this location. The right chest is normal. IMPRESSION: 1. There is a fracture of the distal right clavicle. A chronic fracture was seen in this location in 2019. Some of the fracture lines are sharper on today's  study. An acute  on chronic distal right clavicular fracture is not excluded. 2. No fracture or dislocation within the shoulder itself. Electronically Signed   By: Dorise Bullion III M.D   On: 08/10/2019 05:37    Procedures Procedures   Medications Ordered in ED Medications - No data to display  ED Course  I have reviewed the triage vital signs and the nursing notes.  Pertinent labs & imaging results that were available during my care of the patient were reviewed by me and considered in my medical decision making (see chart for details).  MDM Rules/Calculators/A&P Fall with injury to head and right shoulder.  CT scans and x-rays have been ordered.  Old records are reviewed, and she does have prior ED visits for falls.  CT scans showed presence of forehead hematoma but no intracranial injury and no C-spine injury.  Right shoulder x-ray shows fracture of the distal portion of the clavicle.  Right elbow x-ray shows no fracture.  She is placed in a sling and is referred back to her orthopedic physician for follow-up.  Final Clinical Impression(s) / ED Diagnoses Final diagnoses:  Fall  Fall at home, initial encounter  Closed nondisplaced fracture of right clavicle, initial encounter  Traumatic hematoma of forehead, initial encounter  Abrasion of right elbow, initial encounter    Rx / DC Orders ED Discharge Orders    None       Delora Fuel, MD 49/67/59 [id number]

## 2019-08-15 ENCOUNTER — Ambulatory Visit (INDEPENDENT_AMBULATORY_CARE_PROVIDER_SITE_OTHER): Payer: Medicare Other | Admitting: Physician Assistant

## 2019-08-15 ENCOUNTER — Encounter: Payer: Self-pay | Admitting: Physician Assistant

## 2019-08-15 ENCOUNTER — Other Ambulatory Visit: Payer: Self-pay

## 2019-08-15 DIAGNOSIS — S51811A Laceration without foreign body of right forearm, initial encounter: Secondary | ICD-10-CM | POA: Diagnosis not present

## 2019-08-15 DIAGNOSIS — M898X1 Other specified disorders of bone, shoulder: Secondary | ICD-10-CM | POA: Diagnosis not present

## 2019-08-15 NOTE — Progress Notes (Signed)
Office Visit Note   Patient: [name]           Date of Birth: 11-28-1940           MRN: [id number] Visit Date: 08/15/2019              Requested by: [name], MD [place],  [place] [address] PCP: [name], MD   Assessment & Plan: Visit Diagnoses:  1. Pain of right clavicle   2. Laceration of right forearm, initial encounter     Plan: Left forearm laceration was cleansed with sterile water then dry completely Xeroform is applied with a OpSite over the area.  She will leave this dressing on for 2 days and then remove it apply new dressing with new Xeroform over the area.  Between dressing changes she can wash this area.   Discussed with her gentle range of motion of her elbow supination pronation and range of motion of the wrist and fingers.  No overhead activity.  Sling for comfort.  Like to see her back in 4 weeks repeat 2 views right clavicle.Questions were encouraged and answered.   Follow-Up Instructions: Return in about 4 weeks (around 09/12/2019).   Orders:  No orders of the defined types were placed in this encounter.  No orders of the defined types were placed in this encounter.     Procedures: No procedures performed   Clinical Data: No additional findings.   Subjective: Chief Complaint  Patient presents with  . Left Shoulder - Fracture    HPI [name] comes in today status post fall on 08/10/2019.  She reports that she got up to use the bathroom and lost her balance and fell.  She denied any dizziness loss of consciousness.  She did sustain an abrasion to her right forearm.  She also sustained a hematoma to her forehead and a nondisplaced fracture of her right clavicle.  She has had a previous clavicle fracture which should we had seen her before.  This is felt to be a acute on chronic clavicle fracture.  Fracture had healed on prior radiographs in November 2020.  New radiographs that were obtained in the ER on  08/10/2019 are reviewed and shows slightly sharpening of the fracture site fragment.  Overall position alignment basically unchanged from prior films though.  No other fractures.  Shoulders well located.  Review of Systems See HPI  Objective: Vital Signs: There were no vitals taken for this visit.  Physical Exam General: Well-developed well-nourished female no acute distress Psych: Alert and oriented x3 Ortho Exam Right forearm laceration over the dorsal aspect of the proximal radial region.  She has full supination pronation of both forearms.  Full range of motion bilateral elbows.  Sensation motor intact bilateral hands.  No attempts of range of motion of the shoulder.  She has tenderness over the lateral clavicle no tenting of skin.  Significant ecchymosis about the shoulder girdle.  Specialty Comments:  No specialty comments available.  Imaging: No results found.   PMFS History: Patient Active Problem List   Diagnosis Date Noted  . 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 Radiculopathy due to lumbar intervertebral disc disorder 03/27/2019  . 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 Diarrhea 11/27/2017  . Closed head injury 11/27/2015  . Unilateral headache 05/26/2015  . ARF (acute renal failure) ([place]) 03/12/2015  . Gallstones 03/12/2015  . Nausea without vomiting 12/10/2014  .  Essential hypertension 05/29/2014  . 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 Agent resistant to multiple antibiotics 05/29/2013  . Low back pain radiating to right leg 01/16/2013  . Sinus problem 11/20/2012  . Chronic headaches morning 10/19/2012  . Low sodium levels  133 10/19/2012  . Leg pain, posterior 08/16/2012  . Sciatic neuritis 08/16/2012  . Sleep disturbance, unspecified 08/16/2012  . Medication management 03/24/2012  . Preventative health care 09/24/2011  . Trigeminal neuralgia 09/24/2011  . Postmenopausal HRT  (hormone replacement therapy) 09/24/2011  . Polycythemia 09/20/2011  . Morning headache 02/01/2011  . Hearing aid worn   . Abnormal blood finding   . LOCALIZED SUPERFICIAL SWELLING MASS OR LUMP 09/21/2009  . DYSPNEA 05/07/2008  . Hypnic headache 03/19/2008  . 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 Headache(784.0) 06/05/2007  . JAW PAIN 04/06/2007  . OSTEOARTHRITIS 04/06/2007  . ABNORMAL RESULT, FUNCTION STUDY, [place] 02/05/2007  . HYPERLIPIDEMIA 12/25/2006  . HYPERTENSION 12/25/2006   Past Medical History:  Diagnosis Date  . Abdominal pain 05/29/2013   s/p rx of cephalo resistant e coli   but last rx NG  now residular ?  bladder sx repeat cx sx rx to ty and uro consult   . ADJ DISORDER WITH MIXED ANXIETY & DEPRESSED MOOD 03/03/2010   Qualifier: Diagnosis of  By: [name], [name]   . Closed head injury 02/01/2011   from syncope and had scalp laceration  neg ct .    Marland Kitchen Closed head injury 5-6 yrs ago  . Colitis 11/27/2017  . Complication of anesthesia    migraine several hours after general anesthesia  . Fatty liver   . Gall stones 2016   see ct scan neg HIDA   . GERD (gastroesophageal reflux disease)   . Hearing aid worn   . HOH (hard of hearing)    both ears  . Hyperlipidemia   . Hypertension    echo nl lv function  mild dilitation 2009  . Kidney infection    few yrs ago in hospital  . Medication side effect 09/02/2010   Poss muscle se of 10 crestor   . Migraine    hypnic HA eval by Dr. Earley Favor in the past  . Polycythemia   . 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 Pyelonephritis 03/12/2015  . 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 Syncope 02/01/2011   In shower on vacation  sustained head laceration  8 sutures Had ed visit neg head ct labs and x ray   . Trigeminal neuralgia pain     Family History  Problem Relation Age of Onset  . Ovarian cancer Mother   . Stroke Mother   . Alcohol abuse Father   . Stroke Father   .  Diabetes Brother   . Cancer Paternal Aunt        leukemia, unknown type  . Seizures Daughter   . Hypertension Other   . Colon cancer Neg Hx     Past Surgical History:  Procedure Laterality Date  . ABDOMINAL HYSTERECTOMY  2002   tubal  . CARDIAC CATHETERIZATION  2000   chest pains neg  . CHOLECYSTECTOMY N/A 02/21/2017   Procedure: LAPAROSCOPIC CHOLECYSTECTOMY WITH INTRAOPERATIVE CHOLANGIOGRAM;  Surgeon: Armandina Gemma, MD;  Location: [facility];  Service: General;  Laterality: N/A;  . COLONOSCOPY     multiple  . CRANIOTOMY  12/09/2011   nerve decompression right trigeminal   . DOPPLER ECHOCARDIOGRAPHY  2009   nl  lv function mild lv dilitation  . EYE SURGERY Bilateral    ioc for catatracts  . laparoscopic gallbladder surgery  02/16/2017   Fax from [facility]  . OOPHORECTOMY Bilateral 2002  . rt shoulder surgery     Social History   Occupational History    Comment: retired Forensic psychologist  Tobacco Use  . Smoking status: Never Smoker  . Smokeless tobacco: Never Used  Substance and Sexual Activity  . Alcohol use: Yes    Alcohol/week: 2.0 standard drinks    Types: 2 Glasses of wine per week    Comment: occ wine  . Drug use: No  . Sexual activity: Not on file

## 2019-08-20 ENCOUNTER — Telehealth: Payer: Self-pay | Admitting: Physician Assistant

## 2019-08-20 NOTE — Telephone Encounter (Signed)
Patient called and requested therapy. Wanted Gil's advice on this. She wants to speaks directly to [place] when he returns.  Please have Artis Delay call patient [phone]

## 2019-08-26 ENCOUNTER — Other Ambulatory Visit: Payer: Self-pay | Admitting: Neurology

## 2019-08-26 DIAGNOSIS — Z79899 Other long term (current) drug therapy: Secondary | ICD-10-CM

## 2019-08-26 MED ORDER — LAMOTRIGINE 25 MG PO TABS: ORAL_TABLET | ORAL | 1 refills | Status: DC

## 2019-08-28 ENCOUNTER — Telehealth: Payer: Self-pay | Admitting: Physician Assistant

## 2019-08-28 NOTE — Telephone Encounter (Signed)
Patient called and wanted to know why Artis Delay has not called her back. States she knows him personally and not like him to not call her back.  Please remind Artis Delay to call her back.[phone] about PT

## 2019-08-28 NOTE — Telephone Encounter (Signed)
[name] we will send her to PT for ROM both shoulders at next visit. PT will be here.

## 2019-08-28 NOTE — Telephone Encounter (Signed)
called

## 2019-08-28 NOTE — Telephone Encounter (Signed)
Original message was sent while you were on vacation. Please call patient.

## 2019-08-29 ENCOUNTER — Other Ambulatory Visit: Payer: Self-pay

## 2019-08-29 ENCOUNTER — Other Ambulatory Visit (INDEPENDENT_AMBULATORY_CARE_PROVIDER_SITE_OTHER): Payer: Self-pay

## 2019-08-29 DIAGNOSIS — Z0289 Encounter for other administrative examinations: Secondary | ICD-10-CM

## 2019-08-29 DIAGNOSIS — Z79899 Other long term (current) drug therapy: Secondary | ICD-10-CM | POA: Diagnosis not present

## 2019-08-30 LAB — COMPREHENSIVE METABOLIC PANEL
ALT: 18 IU/L (ref 0–32)
AST: 25 IU/L (ref 0–40)
Albumin/Globulin Ratio: 2.3 — ABNORMAL HIGH (ref 1.2–2.2)
Albumin: 4.1 g/dL (ref 3.7–4.7)
Alkaline Phosphatase: 87 IU/L (ref 39–117)
BUN/Creatinine Ratio: 27 (ref 12–28)
BUN: 23 mg/dL (ref 8–27)
Bilirubin Total: 0.3 mg/dL (ref 0.0–1.2)
CO2: 29 mmol/L (ref 20–29)
Calcium: 9.6 mg/dL (ref 8.7–10.3)
Chloride: 94 mmol/L — ABNORMAL LOW (ref 96–106)
Creatinine, Ser: 0.86 mg/dL (ref 0.57–1.00)
GFR calc Af Amer: 74 mL/min/{1.73_m2} (ref >59)
GFR calc non Af Amer: 64 mL/min/{1.73_m2} (ref >59)
Globulin, Total: 1.8 g/dL (ref 1.5–4.5)
Glucose: 203 mg/dL — ABNORMAL HIGH (ref 65–99)
Potassium: 4.2 mmol/L (ref 3.5–5.2)
Sodium: 134 mmol/L (ref 134–144)
Total Protein: 5.9 g/dL — ABNORMAL LOW (ref 6.0–8.5)

## 2019-08-30 LAB — CBC
Hematocrit: 39.1 % (ref 34.0–46.6)
Hemoglobin: 13.6 g/dL (ref 11.1–15.9)
MCH: 31.6 pg (ref 26.6–33.0)
MCHC: 34.8 g/dL (ref 31.5–35.7)
MCV: 91 fL (ref 79–97)
Platelets: 244 10*3/uL (ref 150–450)
RBC: 4.3 x10E6/uL (ref 3.77–5.28)
RDW: 12.2 % (ref 11.7–15.4)
WBC: 4.9 10*3/uL (ref 3.4–10.8)

## 2019-08-31 ENCOUNTER — Other Ambulatory Visit: Payer: Self-pay | Admitting: Neurology

## 2019-08-31 DIAGNOSIS — G500 Trigeminal neuralgia: Secondary | ICD-10-CM

## 2019-09-03 ENCOUNTER — Telehealth: Payer: Self-pay | Admitting: Neurology

## 2019-09-03 NOTE — Telephone Encounter (Signed)
Pt called requesting a CB from RN to provide an update on the lamoTRIgine (LAMICTAL) 25 MG tablet  Pt states it has obliterated a lot of the pain and has made it possible for her to eat states she still has some pain left but has made it more durable. Pt states she is unsure which medication could be causing it but states she feels nauseated

## 2019-09-04 ENCOUNTER — Other Ambulatory Visit: Payer: Self-pay | Admitting: Neurology

## 2019-09-04 MED ORDER — ONDANSETRON 4 MG PO TBDP: 4.0000 mg | ORAL_TABLET | Freq: Three times a day (TID) | ORAL | 3 refills | Status: DC | PRN

## 2019-09-04 NOTE — Telephone Encounter (Signed)
Spoke with Dr. Jaynee Eagles. She stated she could offer Zofran if patient feels the nausea could be caused by the Lamotrigine. She could decrease the Trileptal if she was feeling nauseated before adding lamotrigine.   I spoke with the pt. She takes both Lamotrigine & Oxcarbazepine together at the same time twice daily 6 AM & 6 PM. She tries to have food on her stomach both times and she said the nausea was not there before she started taking both together. She gets very nauseated for 2 hours after she takes the medications and then it gets better. Pt does not want to give up the medications because they have helped her pain. She is very interested in trying the Zofran to see if that helps. Pt verbalized appreciation for the call.

## 2019-09-04 NOTE — Telephone Encounter (Signed)
Ordered. thanks

## 2019-09-05 ENCOUNTER — Encounter: Payer: Self-pay | Admitting: *Deleted

## 2019-09-05 NOTE — Telephone Encounter (Signed)
Pt called to inform pharmacy is needing a PA for the zofran states she would like to be able to pickup today if possible

## 2019-09-05 NOTE — Telephone Encounter (Addendum)
Completed ondansetron ODT PA on CMM. Key: [id number]. Pt is also welcome to use a goodrx coupon at [place], [employer], [facility], for about $15-20. I called the pt and we discussed good rx. While I was on the phone with pt the medication was approved. Plan will be for pt to call [facility] and check copay, then if needed she will have Rx transferred to another pharmacy to use good rx. Pt verbalized appreciation for the call.

## 2019-09-05 NOTE — Telephone Encounter (Signed)
Noted. I sent pt a mychart message.  

## 2019-09-10 ENCOUNTER — Other Ambulatory Visit: Payer: Self-pay

## 2019-09-10 ENCOUNTER — Telehealth: Payer: Self-pay | Admitting: Internal Medicine

## 2019-09-10 ENCOUNTER — Other Ambulatory Visit (INDEPENDENT_AMBULATORY_CARE_PROVIDER_SITE_OTHER): Payer: Medicare Other

## 2019-09-10 DIAGNOSIS — R350 Frequency of micturition: Secondary | ICD-10-CM

## 2019-09-10 LAB — URINALYSIS, ROUTINE W REFLEX MICROSCOPIC
Hgb urine dipstick: NEGATIVE
Ketones, ur: NEGATIVE
Leukocytes,Ua: NEGATIVE
Nitrite: NEGATIVE
Specific Gravity, Urine: 1.02 (ref 1.000–1.030)
Total Protein, Urine: NEGATIVE
Urine Glucose: NEGATIVE
Urobilinogen, UA: 0.2 (ref 0.0–1.0)
pH: 5 (ref 5.0–8.0)

## 2019-09-10 NOTE — Progress Notes (Signed)
Urine doesn't look infected  but  I will wait for the cutlure  and send in macrobid I if  symptoms get worse  and act like a uti  Otherwise can hold off nd stay hydrated  Megan please send in macrobid 100 mg 1 po bid for 5 days  disp 10 in case needed for UTI

## 2019-09-10 NOTE — Telephone Encounter (Signed)
Called patient and she stated that she is having belly discomfort and frequent urination and discomfort with urination. Spoke with Dr. Regis Bill and she stated that she wants to order a UA, Micro, Culture and called patient and she stated that she wants to have done at [facility]. I have placed orders for [facility] and patient is aware.

## 2019-09-10 NOTE — Telephone Encounter (Signed)
Pt would like Dr. Regis Bill to place lab order to check her urine.

## 2019-09-11 ENCOUNTER — Other Ambulatory Visit: Payer: Self-pay | Admitting: Internal Medicine

## 2019-09-11 ENCOUNTER — Other Ambulatory Visit: Payer: Self-pay

## 2019-09-11 LAB — URINE CULTURE
MICRO NUMBER:: 10384263
SPECIMEN QUALITY:: ADEQUATE

## 2019-09-11 MED ORDER — NITROFURANTOIN MONOHYD MACRO 100 MG PO CAPS: 100.0000 mg | ORAL_CAPSULE | Freq: Two times a day (BID) | ORAL | 0 refills | Status: DC

## 2019-09-12 ENCOUNTER — Ambulatory Visit: Payer: Medicare Other

## 2019-09-12 ENCOUNTER — Ambulatory Visit: Payer: Medicare Other | Admitting: Physician Assistant

## 2019-09-12 ENCOUNTER — Encounter: Payer: Self-pay | Admitting: Physician Assistant

## 2019-09-12 ENCOUNTER — Other Ambulatory Visit: Payer: Self-pay

## 2019-09-12 DIAGNOSIS — M898X1 Other specified disorders of bone, shoulder: Secondary | ICD-10-CM

## 2019-09-12 DIAGNOSIS — M25512 Pain in left shoulder: Secondary | ICD-10-CM

## 2019-09-12 NOTE — Progress Notes (Signed)
Office Visit Note   Patient: [name]           Date of Birth: 1940/12/03           MRN: [id number] Visit Date: 09/12/2019              Requested by: [name], MD [place],  [place] [address] PCP: [name], MD   Assessment & Plan: Visit Diagnoses:  1. Pain of right clavicle   2. Acute pain of left shoulder     Plan: She is given a prescription for physical therapy to start passive range of motion of the right shoulder.  They will work on range of motion, strengthening, home exercise and modalities for the left shoulder.  We will see her back in 1 month perform new radiographs of the right clavicle at that time.  She can discontinue the sling at this point time.  No heavy lifting with the right arm.  Questions were encouraged and answered.  Follow-Up Instructions: Return in about 4 weeks (around 10/10/2019).   Orders:  Orders Placed This Encounter  Procedures  . XR Clavicle Right   No orders of the defined types were placed in this encounter.     Procedures: No procedures performed   Clinical Data: No additional findings.   Subjective: Chief Complaint  Patient presents with  . Right Shoulder - Follow-up    HPI [name] returns today to follow-up on her acute on chronic distal right clavicle fracture.  She is now just 4 weeks status post reinjury of her right clavicle.  She has been wearing a sling.  She said no PT is been doing just gentle range of motion of the hand wrist and elbow.  States she is having no pain in the right shoulder.  She is having pain still in the left shoulder she is seen by Dr. Ninfa Linden over a month ago was given a subacromial injection in the shoulder.  She is asking what else can be done about her shoulder.  Review of Systems See HPI  Objective: Vital Signs: There were no vitals taken for this visit.  Physical Exam Constitutional:      Appearance: She is not ill-appearing or diaphoretic.    Pulmonary:     Effort: Pulmonary effort is normal.  Neurological:     Mental Status: She is alert.  Psychiatric:        Mood and Affect: Mood normal.     Ortho Exam Right shoulder passively bring her arm overhead without pain.  She has slight discomfort with abduction of the right arm across the chest.  Right clavicle she has no tenderness with palpation over the distal clavicle area.  Slight motion with palpation over the right distal clavicle.  Good range of motion right elbow wrist and hand. Left shoulder positive impingement testing.  Full forward flexion without significant pain.  Passive range of motion reveals shoulder motion of the fluid in the shoulder to be well located. Specialty Comments:  No specialty comments available.  Imaging: XR Clavicle Right  Result Date: 09/12/2019 Right shoulder 2 views: No change in overall position alignment.  In the distal clavicle fracture.  Some early signs of Consolidation.  Shoulders well located.    PMFS History: Patient Active Problem List   Diagnosis Date Noted  . 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 Radiculopathy due to lumbar intervertebral disc disorder 03/27/2019  . Spinal stenosis of lumbar region  with neurogenic claudication 03/27/2019  . Diarrhea 11/27/2017  . Closed head injury 11/27/2015  . Unilateral headache 05/26/2015  . ARF (acute renal failure) ([place]) 03/12/2015  . Gallstones 03/12/2015  . Nausea without vomiting 12/10/2014  . Essential hypertension 05/29/2014  . 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 Agent resistant to multiple antibiotics 05/29/2013  . Low back pain radiating to right leg 01/16/2013  . Sinus problem 11/20/2012  . Chronic headaches morning 10/19/2012  . Low sodium levels  133 10/19/2012  . Leg pain, posterior 08/16/2012  . Sciatic neuritis 08/16/2012  . Sleep disturbance, unspecified 08/16/2012  . Medication management 03/24/2012  . Preventative health  care 09/24/2011  . Trigeminal neuralgia 09/24/2011  . Postmenopausal HRT (hormone replacement therapy) 09/24/2011  . Polycythemia 09/20/2011  . Morning headache 02/01/2011  . Hearing aid worn   . Abnormal blood finding   . LOCALIZED SUPERFICIAL SWELLING MASS OR LUMP 09/21/2009  . DYSPNEA 05/07/2008  . Hypnic headache 03/19/2008  . 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 Headache(784.0) 06/05/2007  . JAW PAIN 04/06/2007  . OSTEOARTHRITIS 04/06/2007  . ABNORMAL RESULT, FUNCTION STUDY, [place] 02/05/2007  . HYPERLIPIDEMIA 12/25/2006  . HYPERTENSION 12/25/2006   Past Medical History:  Diagnosis Date  . Abdominal pain 05/29/2013   s/p rx of cephalo resistant e coli   but last rx NG  now residular ?  bladder sx repeat cx sx rx to ty and uro consult   . ADJ DISORDER WITH MIXED ANXIETY & DEPRESSED MOOD 03/03/2010   Qualifier: Diagnosis of  By: [name], [name]   . Closed head injury 02/01/2011   from syncope and had scalp laceration  neg ct .    Marland Kitchen Closed head injury 5-6 yrs ago  . Colitis 11/27/2017  . Complication of anesthesia    migraine several hours after general anesthesia  . Fatty liver   . Gall stones 2016   see ct scan neg HIDA   . GERD (gastroesophageal reflux disease)   . Hearing aid worn   . HOH (hard of hearing)    both ears  . Hyperlipidemia   . Hypertension    echo nl lv function  mild dilitation 2009  . Kidney infection    few yrs ago in hospital  . Medication side effect 09/02/2010   Poss muscle se of 10 crestor   . Migraine    hypnic HA eval by Dr. Earley Favor in the past  . Polycythemia   . 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 Pyelonephritis 03/12/2015  . 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 Syncope 02/01/2011   In shower on vacation  sustained head laceration  8 sutures Had ed visit neg head ct labs and x ray   . Trigeminal neuralgia pain     Family History  Problem Relation Age of Onset  . Ovarian cancer Mother    . Stroke Mother   . Alcohol abuse Father   . Stroke Father   . Diabetes Brother   . Cancer Paternal Aunt        leukemia, unknown type  . Seizures Daughter   . Hypertension Other   . Colon cancer Neg Hx     Past Surgical History:  Procedure Laterality Date  . ABDOMINAL HYSTERECTOMY  2002   tubal  . CARDIAC CATHETERIZATION  2000   chest pains neg  . CHOLECYSTECTOMY N/A 02/21/2017   Procedure: LAPAROSCOPIC CHOLECYSTECTOMY WITH INTRAOPERATIVE CHOLANGIOGRAM;  Surgeon: Armandina Gemma, MD;  Location: [facility];  Service: General;  Laterality: N/A;  . COLONOSCOPY     multiple  . CRANIOTOMY  12/09/2011   nerve decompression right trigeminal   . DOPPLER ECHOCARDIOGRAPHY  2009   nl lv function mild lv dilitation  . EYE SURGERY Bilateral    ioc for catatracts  . laparoscopic gallbladder surgery  02/16/2017   Fax from [facility]  . OOPHORECTOMY Bilateral 2002  . rt shoulder surgery     Social History   Occupational History    Comment: retired Forensic psychologist  Tobacco Use  . Smoking status: Never Smoker  . Smokeless tobacco: Never Used  Substance and Sexual Activity  . Alcohol use: Yes    Alcohol/week: 2.0 standard drinks    Types: 2 Glasses of wine per week    Comment: occ wine  . Drug use: No  . Sexual activity: Not on file

## 2019-09-13 ENCOUNTER — Other Ambulatory Visit: Payer: Self-pay

## 2019-09-13 NOTE — Progress Notes (Signed)
Urine shows no definite  UTI bacteria   .   With the clear UA  would not  add antibiotic at this time  unless   new sorrisome symptoms      we can repeat the UA at  your visit next week

## 2019-09-16 ENCOUNTER — Encounter: Payer: Self-pay | Admitting: Internal Medicine

## 2019-09-16 ENCOUNTER — Ambulatory Visit (INDEPENDENT_AMBULATORY_CARE_PROVIDER_SITE_OTHER): Payer: Medicare Other | Admitting: Internal Medicine

## 2019-09-16 ENCOUNTER — Other Ambulatory Visit: Payer: Self-pay

## 2019-09-16 VITALS — BP 126/84 | HR 64 | Temp 98.0°F | Ht 66.0 in | Wt 129.4 lb

## 2019-09-16 DIAGNOSIS — R399 Unspecified symptoms and signs involving the genitourinary system: Secondary | ICD-10-CM

## 2019-09-16 DIAGNOSIS — R609 Edema, unspecified: Secondary | ICD-10-CM | POA: Diagnosis not present

## 2019-09-16 DIAGNOSIS — R634 Abnormal weight loss: Secondary | ICD-10-CM

## 2019-09-16 DIAGNOSIS — Z79899 Other long term (current) drug therapy: Secondary | ICD-10-CM | POA: Diagnosis not present

## 2019-09-16 DIAGNOSIS — I10 Essential (primary) hypertension: Secondary | ICD-10-CM

## 2019-09-16 DIAGNOSIS — G500 Trigeminal neuralgia: Secondary | ICD-10-CM

## 2019-09-16 DIAGNOSIS — R739 Hyperglycemia, unspecified: Secondary | ICD-10-CM | POA: Diagnosis not present

## 2019-09-16 DIAGNOSIS — Z9181 History of falling: Secondary | ICD-10-CM

## 2019-09-16 LAB — GLUCOSE, POCT (MANUAL RESULT ENTRY): POC Glucose: 88 mg/dl (ref 70–99)

## 2019-09-16 MED ORDER — AMLODIPINE BESYLATE 10 MG PO TABS: 5.0000 mg | ORAL_TABLET | Freq: Every day | ORAL | 1 refills | Status: DC

## 2019-09-16 NOTE — Progress Notes (Signed)
This visit occurred during the SARS-CoV-2 public health emergency.  Safety protocols were in place, including screening questions prior to the visit, additional usage of staff PPE, and extensive cleaning of exam room while observing appropriate contact time as indicated for disinfecting solutions.    Chief Complaint  Patient presents with  . Edema    ankles and feet, does not seem to go down with stockings    HPI: [name] 79 y.o. come in for fu visit for [facility] medical problems  feet have been swelling more since dec  Diuretic to 1/2 dose ( for  Hyponatremia)  [name] seems to be in range 126 range  [name] again tripped out of car balance is off when moving certain ways  orig in dark going to br.  Now using walker on uneven ground.    [name]  And medication     Still pain ful and has lost weight cause of dec eating caue of pain  See dr Ferdinand Lango notes and meds  To have  A procedure  From dr Vertell Limber ? Ablation?  Stopped  Baclofen caused nausea  Other meds may have caused drowsiness  takine lamictal?  Cymbalta caused hallucinations  And other sx  Hx of lumbar myelopathy rx   Sx uti last week but  Unconfirmed by ua and ucx  But now better  Thinks could be a SE  of med and not a new infection today .  medication management has been an issue because of  A number of conditions requiring managmetne and medication indication s that over lap.  Se of medc of concerns    ROS: See pertinent positives and negatives per HPI.  Past Medical History:  Diagnosis Date  . Abdominal pain 05/29/2013   s/p rx of cephalo resistant e coli   but last rx NG  now residular ?  bladder sx repeat cx sx rx to ty and uro consult   . ADJ DISORDER WITH MIXED ANXIETY & DEPRESSED MOOD 03/03/2010   Qualifier: Diagnosis of  By: [name], [name]   . Closed head injury 02/01/2011   from syncope and had scalp laceration  neg ct .    Marland Kitchen Closed head injury 5-6 yrs ago  . Colitis 11/27/2017  . Complication of anesthesia      migraine several hours after general anesthesia  . Fatty liver   . Gall stones 2016   see ct scan neg HIDA   . GERD (gastroesophageal reflux disease)   . Hearing aid worn   . HOH (hard of hearing)    both ears  . Hyperlipidemia   . Hypertension    echo nl lv function  mild dilitation 2009  . Kidney infection    few yrs ago in hospital  . Medication side effect 09/02/2010   Poss muscle se of 10 crestor   . Migraine    hypnic HA eval by Dr. Earley Favor in the past  . Polycythemia   . 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 Pyelonephritis 03/12/2015  . 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 Syncope 02/01/2011   In shower on vacation  sustained head laceration  8 sutures Had ed visit neg head ct labs and x ray   . Trigeminal neuralgia pain     Family History  Problem Relation Age of Onset  . Ovarian cancer Mother   . Stroke Mother   . Alcohol abuse Father   . Stroke  Father   . Diabetes Brother   . Cancer Paternal Aunt        leukemia, unknown type  . Seizures Daughter   . Hypertension Other   . Colon cancer Neg Hx     Social History   Socioeconomic History  . Marital status: Married    Spouse name: Not on file  . Number of children: 2  . Years of education: Not on file  . Highest education level: Not on file  Occupational History    Comment: retired Forensic psychologist  Tobacco Use  . Smoking status: Never Smoker  . Smokeless tobacco: Never Used  Substance and Sexual Activity  . Alcohol use: Yes    Alcohol/week: 2.0 standard drinks    Types: 2 Glasses of wine per week    Comment: occ wine  . Drug use: No  . Sexual activity: Not on file  Other Topics Concern  . Not on file  Social History Narrative   Married   HH of 2-3 (god daughter)   Pets 2 dogs   Non smoker    Child is a physician   G2P2      Caffeine: 2 cups/day   Social Determinants of Health   Financial Resource Strain:   . Difficulty of Paying Living Expenses:   Food  Insecurity:   . Worried About Charity fundraiser in the Last Year:   . Arboriculturist in the Last Year:   Transportation Needs:   . Film/video editor (Medical):   Marland Kitchen Lack of Transportation (Non-Medical):   Physical Activity:   . Days of Exercise per Week:   . Minutes of Exercise per Session:   Stress:   . Feeling of Stress :   Social Connections:   . Frequency of Communication with Friends and Family:   . Frequency of Social Gatherings with Friends and Family:   . Attends Religious Services:   . Active Member of Clubs or Organizations:   . Attends Archivist Meetings:   Marland Kitchen Marital Status:     Outpatient Medications Prior to Visit  Medication Sig Dispense Refill  . Acetaminophen (TYLENOL PO) Take 2 tablets by mouth as needed.    Marland Kitchen albuterol (PROVENTIL HFA;VENTOLIN HFA) 108 (90 Base) MCG/ACT inhaler Inhale 2 puffs into the lungs every 6 (six) hours as needed. 1 Inhaler 2  . alendronate (FOSAMAX) 70 MG tablet Take 1 tablet (70 mg total) by mouth every 7 (seven) days. Take with a full glass of water on an empty stomach. 4 tablet 11  . benazepril (LOTENSIN) 20 MG tablet Take 1 tablet (20 mg total) by mouth daily. 90 tablet 1  . budesonide-formoterol (SYMBICORT) 160-4.5 MCG/ACT inhaler Inhale 2 puffs into the lungs 2 (two) times daily. 1 Inhaler 2  . butalbital-aspirin-caffeine-codeine (ASCOMP-CODEINE) 50-325-40-30 MG capsule TAKE 1-2 CAPSULES BY MOUTH EVERY FOUR TO SIX HOURS AS NEEDED FOR MIGRAINE RESCUE,MAX OF 6 CAPSULES IN 24 HOURS 30 capsule 0  . chlorthalidone (HYGROTON) 25 MG tablet Take 1 tablet (25 mg total) by mouth daily. (Patient taking differently: Take 12.5 mg by mouth daily. ) 90 tablet 0  . conjugated estrogens (PREMARIN) vaginal cream Place 1 Applicatorful vaginally 2 (two) times a week.    . fluticasone (FLONASE) 50 MCG/ACT nasal spray Place 2 sprays into both nostrils as needed for allergies or rhinitis.     Marland Kitchen lamoTRIgine (LAMICTAL) 25 MG tablet Take 1  tablet(47m twice daily for one week then increase to two  tablets(18m) twice daily Stop immediately for rash. 120 tablet 1  . nitrofurantoin, macrocrystal-monohydrate, (MACROBID) 100 MG capsule Take 1 capsule (100 mg total) by mouth 2 (two) times daily. 10 capsule 0  . omeprazole (PRILOSEC) 20 MG capsule Take 20 mg by mouth daily.    . ondansetron (ZOFRAN-ODT) 4 MG disintegrating tablet Take 1-2 tablets (4-8 mg total) by mouth every 8 (eight) hours as needed for nausea. 60 tablet 3  . OVER THE COUNTER MEDICATION OTC eye drops    . Oxcarbazepine (TRILEPTAL) 300 MG tablet Take 600 mg by mouth 2 (two) times daily.    .Marland KitchenamLODipine (NORVASC) 10 MG tablet TAKE 1 TABLET BY MOUTH EVERY DAY 90 tablet 1  . NUCYNTA 50 MG tablet Take 50 mg by mouth every 4 (four) hours as needed.    . baclofen (LIORESAL) 10 MG tablet Take 0.5-1 tablets (5-10 mg total) by mouth 3 (three) times daily. (Patient not taking: Reported on 09/16/2019) 90 each 1  . DULoxetine (CYMBALTA) 30 MG capsule Take 1 capsule (30 mg total) by mouth daily. For 7 nights then take 2 capsules daily at night (Patient not taking: Reported on 09/16/2019) 60 capsule 1   No facility-administered medications prior to visit.     EXAM:  BP 126/84   Pulse 64   Temp 98 F (36.7 C) (Temporal)   Ht 5' 6"  (1.676 m)   Wt 129 lb 6.4 oz (58.7 kg)   SpO2 98%   BMI 20.89 kg/m   Body mass index is 20.89 kg/m. Wt Readings from Last 3 Encounters:  09/16/19 129 lb 6.4 oz (58.7 kg)  08/10/19 123 lb (55.8 kg)  07/08/19 132 lb (59.9 kg)    GENERAL: vitals reviewed and listed above, alert, oriented, appears well hydrated and in no acute distress HEENT: atraumatic, conjunctiva  clear, no obvious abnormalities on inspection of external nose and ears OP : masked  NECK: no obvious masses on inspection palpation  LUNGS: clear to auscultation bilaterally, no wheezes, rales or rhonchi, good air movement CV: HRRR, no clubbing cyanosis or  Feet puffy 2 + edema  nl  cap refill  MS: moves all extremities without noticeable focal  abnormality PSYCH: pleasant and cooperative, no obvious depression or anxiety Gait appears pretty  steady  Neg rhomb erg  No tremor   Lab Results  Component Value Date   WBC 4.9 08/29/2019   HGB 13.6 08/29/2019   HCT 39.1 08/29/2019   PLT 244 08/29/2019   GLUCOSE 203 (H) 08/29/2019   CHOL 279 (H) 05/27/2019   TRIG 78.0 05/27/2019   HDL 118.80 05/27/2019   LDLDIRECT 135.9 10/16/2012   LDLCALC 145 (H) 05/27/2019   ALT 18 08/29/2019   AST 25 08/29/2019   NA 134 08/29/2019   K 4.2 08/29/2019   CL 94 (L) 08/29/2019   CREATININE 0.86 08/29/2019   BUN 23 08/29/2019   CO2 29 08/29/2019   TSH 0.54 05/27/2019   INR 1.23 03/12/2015   HGBA1C 5.5 05/25/2018   BP Readings from Last 3 Encounters:  09/16/19 126/84  08/10/19 (!) 165/79  07/08/19 126/77   cbg random today  88  ASSESSMENT AND PLAN:  Discussed the following assessment and plan:  Edema, unspecified type - in after in amlod and dec diuretic   Medication management  Essential hypertension  Hyperglycemia - Plan: POCT glucose (manual entry)  Trigeminal neuralgia  History of fall  Lower urinary tract symptoms (LUTS) - imporved  neg u cx better today  Weight loss - felt from  dec oral intake cuase of TN bp was  Up in ed   [facility] now  Edema  May dec from dec amlodipine  Hesitant to inc clt cause of hx of low sodium   Dec amlodipine to 5  And plan fu bp and status in weeks   No obv cv pulm compromise   . -Patient advised to return or notify health care team  if  new concerns arise.  Uncertain if could be having se  Low bp certainly se of many of the meds tried  Disc fall preventions .  Outside external source  DATA REVIEWED:  Neuro  Total time on date  of service including record review ordering and plan of care: 32 minutes   Patient Instructions  Plan decrease the amlodipine   To 5 mg  ( split in 1/2 _)  And see if swelling  Decreases over the next 2-4  weeks.    check BP  And send in readings .   In a few weeks.   Walkers and assistant I uneven ground to avoid falls.   Let us know weight   And how feet are doing   Plan ROV in  3 months .    Or as needed.      [name]. Laruen Risser M.D.

## 2019-09-16 NOTE — Patient Instructions (Addendum)
Plan decrease the amlodipine   To 5 mg  ( split in 1/2 _)  And see if swelling  Decreases over the next 2-4 weeks.    check BP  And send in readings .   In a few weeks.   Walkers and assistant I uneven ground to avoid falls.   Let us know weight   And how feet are doing   Plan ROV in  3 months .    Or as needed.

## 2019-09-20 DIAGNOSIS — G500 Trigeminal neuralgia: Secondary | ICD-10-CM | POA: Diagnosis not present

## 2019-09-20 DIAGNOSIS — M5416 Radiculopathy, lumbar region: Secondary | ICD-10-CM | POA: Diagnosis not present

## 2019-09-24 DIAGNOSIS — R296 Repeated falls: Secondary | ICD-10-CM | POA: Diagnosis not present

## 2019-09-24 DIAGNOSIS — M25512 Pain in left shoulder: Secondary | ICD-10-CM | POA: Diagnosis not present

## 2019-09-24 DIAGNOSIS — Z9181 History of falling: Secondary | ICD-10-CM | POA: Diagnosis not present

## 2019-09-24 DIAGNOSIS — M25511 Pain in right shoulder: Secondary | ICD-10-CM | POA: Diagnosis not present

## 2019-09-24 NOTE — Telephone Encounter (Signed)
Pt states she went to her physical therapy office today and had her Bp checked there. Her BP is 173/100 and is not sure what to do.  Pt can be reached at [phone]

## 2019-09-24 NOTE — Telephone Encounter (Signed)
Try temporarily stopping the amlodipine for the next  10 -14 days  And then let us know BP and feet swelling status

## 2019-09-24 NOTE — Telephone Encounter (Signed)
Please get a monitor  Of course 170 is to high if it stays that  High  but  I need a baseline and fu bp  And  Pulse readings   This  week  We may have to add a different medication  Back

## 2019-09-30 ENCOUNTER — Other Ambulatory Visit: Payer: Self-pay | Admitting: Internal Medicine

## 2019-09-30 ENCOUNTER — Other Ambulatory Visit: Payer: Self-pay

## 2019-09-30 MED ORDER — CARVEDILOL 6.25 MG PO TABS: 6.2500 mg | ORAL_TABLET | Freq: Two times a day (BID) | ORAL | 1 refills | Status: DC

## 2019-09-30 NOTE — Telephone Encounter (Signed)
So bp is up still  Is the feet swelling any better?  Add carvediol 6.25 mg 1 twice a day disp 60  Refill x 1  Plan   Continue to follow the BP and pulse    wsend in readings  Weekly Then go from there  This medication can potentially lower the pulse in some people but  Doubt at this dose  .    (We are avoiding increase in diuretic for now cause of the  Low sodium levels )

## 2019-10-01 DIAGNOSIS — R296 Repeated falls: Secondary | ICD-10-CM | POA: Diagnosis not present

## 2019-10-01 DIAGNOSIS — M25511 Pain in right shoulder: Secondary | ICD-10-CM | POA: Diagnosis not present

## 2019-10-01 DIAGNOSIS — Z9181 History of falling: Secondary | ICD-10-CM | POA: Diagnosis not present

## 2019-10-01 DIAGNOSIS — M25512 Pain in left shoulder: Secondary | ICD-10-CM | POA: Diagnosis not present

## 2019-10-02 NOTE — Telephone Encounter (Signed)
So that number is pretty high and if stays 160 and above  After  A few days then   we can increase the carvedilol to 12.5 mg twice a day  Keep sending in bp readings as you are doing

## 2019-10-04 NOTE — Telephone Encounter (Signed)
Sorry  Feet are still swollen  Before I advise please   Please update comfirm   The bp meds taken  Are you taking   Carvedilol 6.25 twice a day ? cholrthalidone 1/2 pill per day? Lisinopril?  20 mg per day ? Amlodipine stopped? Or 2.5 mg   ?

## 2019-10-07 ENCOUNTER — Other Ambulatory Visit: Payer: Self-pay

## 2019-10-07 ENCOUNTER — Other Ambulatory Visit: Payer: Self-pay | Admitting: Internal Medicine

## 2019-10-07 DIAGNOSIS — E871 Hypo-osmolality and hyponatremia: Secondary | ICD-10-CM

## 2019-10-07 MED ORDER — FUROSEMIDE 20 MG PO TABS: 20.0000 mg | ORAL_TABLET | Freq: Every day | ORAL | 0 refills | Status: DC

## 2019-10-07 NOTE — Telephone Encounter (Signed)
So     Increase  carvedilol 12.5 mg   Twice a day    Maximum dose is  25 mg twice a day which may be necessary to control BP )   Add lasix 20 mg  Per day to see if can get off the fluid  In your feet  (this medicine can also lower your potassium )   Stop the chlorthalidone     Plan  Check bmp in 10 - 14 days to check potassium level and kidney function

## 2019-10-09 ENCOUNTER — Ambulatory Visit: Payer: Medicare Other | Admitting: Physician Assistant

## 2019-10-09 NOTE — Telephone Encounter (Signed)
So send in readings  After weekend .  Can check readings daily or as needed when rested as possible  Record for  yourself and then send in readings next weekend .

## 2019-10-10 ENCOUNTER — Other Ambulatory Visit: Payer: Self-pay

## 2019-10-11 ENCOUNTER — Other Ambulatory Visit (INDEPENDENT_AMBULATORY_CARE_PROVIDER_SITE_OTHER): Payer: Medicare Other

## 2019-10-11 DIAGNOSIS — E871 Hypo-osmolality and hyponatremia: Secondary | ICD-10-CM

## 2019-10-11 LAB — BASIC METABOLIC PANEL
BUN: 30 mg/dL — ABNORMAL HIGH (ref 6–23)
CO2: 34 mEq/L — ABNORMAL HIGH (ref 19–32)
Calcium: 9.3 mg/dL (ref 8.4–10.5)
Chloride: 97 mEq/L (ref 96–112)
Creatinine, Ser: 0.97 mg/dL (ref 0.40–1.20)
GFR: 55.22 mL/min — ABNORMAL LOW (ref >60.00)
Glucose, Bld: 92 mg/dL (ref 70–99)
Potassium: 4.2 mEq/L (ref 3.5–5.1)
Sodium: 134 mEq/L — ABNORMAL LOW (ref 135–145)

## 2019-10-14 ENCOUNTER — Other Ambulatory Visit: Payer: Self-pay

## 2019-10-14 ENCOUNTER — Encounter: Payer: Self-pay | Admitting: Physician Assistant

## 2019-10-14 ENCOUNTER — Ambulatory Visit: Payer: Medicare Other | Admitting: Physician Assistant

## 2019-10-14 ENCOUNTER — Ambulatory Visit: Payer: Self-pay

## 2019-10-14 DIAGNOSIS — M25512 Pain in left shoulder: Secondary | ICD-10-CM

## 2019-10-14 DIAGNOSIS — M898X1 Other specified disorders of bone, shoulder: Secondary | ICD-10-CM

## 2019-10-14 MED ORDER — LIDOCAINE HCL 1 % IJ SOLN
3.0000 mL | INTRAMUSCULAR | Status: AC | PRN
  Administered 2019-10-14: 3 mL

## 2019-10-14 MED ORDER — METHYLPREDNISOLONE ACETATE 40 MG/ML IJ SUSP
40.0000 mg | INTRAMUSCULAR | Status: AC | PRN
  Administered 2019-10-14: 40 mg via INTRA_ARTICULAR

## 2019-10-14 NOTE — Progress Notes (Signed)
Office Visit Note   Patient: [name]           Date of Birth: [birth year]           MRN: [id number] Visit Date: 10/14/2019              Requested by: [name], MD [place],  [place] [address] PCP: [name], MD   Assessment & Plan: Visit Diagnoses:  1. Pain of right clavicle   2. Acute pain of left shoulder     Plan: Right shoulder activities as tolerated.  Left shoulder recommend she continue physical therapy for.  Discussed for flexion exercises wall crawls with her today.  See how she does with the injection.  Follow-up with [place] on as-needed basis.  Questions encouraged and answered  Follow-Up Instructions: Return if symptoms worsen or fail to improve.   Orders:  Orders Placed This Encounter  Procedures  . Large Joint Inj  . XR Clavicle Right   No orders of the defined types were placed in this encounter.     Procedures: Large Joint Inj: L subacromial bursa on 10/14/2019 3:29 PM Indications: pain Details: 22 G 1.5 in needle, superior approach  Arthrogram: No  Medications: 3 mL lidocaine 1 %; 40 mg methylPREDNISolone acetate 40 MG/ML Outcome: tolerated well, no immediate complications Procedure, treatment alternatives, risks and benefits explained, specific risks discussed. Consent was given by the patient. Immediately prior to procedure a time out was called to verify the correct patient, procedure, equipment, support staff and site/side marked as required. Patient was prepped and draped in the usual sterile fashion.       Clinical Data: No additional findings.   Subjective: Chief Complaint  Patient presents with  . Right Shoulder - Follow-up  . Left Shoulder - Follow-up    HPI  Review of Systems   Objective: Vital Signs: There were no vitals taken for this visit.  Physical Exam Constitutional:      Appearance: She is not ill-appearing or diaphoretic.  Pulmonary:     Effort: Pulmonary effort is  normal.  Neurological:     Mental Status: She is alert and oriented to person, place, and time.  Psychiatric:        Behavior: Behavior normal.     Ortho Exam Right shoulder: Palpable callus over the clavicle nontender.  She lacks last 10 degrees or so full overhead activity with right shoulder but no pain.  Left shoulder she lacks about 30 to 40 degrees of forward flexion and pain at about 110 degrees of active and passive forward flexion of the left shoulder.  Positive impingement testing on the left. Specialty Comments:  No specialty comments available.  Imaging: XR Clavicle Right  Result Date: 10/14/2019 Right clavicle 2 views: No change in overall position alignment.  Further consolidation of the fracture.  Fracture slightly still slightly evident.  Shortening of the clavicle is noted.    PMFS History: Patient Active Problem List   Diagnosis Date Noted  . 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 Radiculopathy due to lumbar intervertebral disc disorder 03/27/2019  . 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 Diarrhea 11/27/2017  . Closed head injury 11/27/2015  . Unilateral headache 05/26/2015  . ARF (acute renal failure) ([place]) 03/12/2015  . Gallstones 03/12/2015  . Nausea without vomiting 12/10/2014  . Essential hypertension 05/29/2014  . 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 Agent resistant to multiple antibiotics 05/29/2013  .  Low back pain radiating to right leg 01/16/2013  . Sinus problem 11/20/2012  . Chronic headaches morning 10/19/2012  . Low sodium levels  133 10/19/2012  . Leg pain, posterior 08/16/2012  . Sciatic neuritis 08/16/2012  . Sleep disturbance, unspecified 08/16/2012  . Medication management 03/24/2012  . Preventative health care 09/24/2011  . Trigeminal neuralgia 09/24/2011  . Postmenopausal HRT (hormone replacement therapy) 09/24/2011  . Polycythemia 09/20/2011  . Morning  headache 02/01/2011  . Hearing aid worn   . Abnormal blood finding   . LOCALIZED SUPERFICIAL SWELLING MASS OR LUMP 09/21/2009  . DYSPNEA 05/07/2008  . Hypnic headache 03/19/2008  . 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 Headache(784.0) 06/05/2007  . JAW PAIN 04/06/2007  . OSTEOARTHRITIS 04/06/2007  . ABNORMAL RESULT, FUNCTION STUDY, [place] 02/05/2007  . HYPERLIPIDEMIA 12/25/2006  . HYPERTENSION 12/25/2006   Past Medical History:  Diagnosis Date  . Abdominal pain 05/29/2013   s/p rx of cephalo resistant e coli   but last rx NG  now residular ?  bladder sx repeat cx sx rx to ty and uro consult   . ADJ DISORDER WITH MIXED ANXIETY & DEPRESSED MOOD 03/03/2010   Qualifier: Diagnosis of  By: [name], [name]   . Closed head injury 02/01/2011   from syncope and had scalp laceration  neg ct .    Marland Kitchen Closed head injury 5-6 yrs ago  . Colitis 11/27/2017  . Complication of anesthesia    migraine several hours after general anesthesia  . Fatty liver   . Gall stones 2016   see ct scan neg HIDA   . GERD (gastroesophageal reflux disease)   . Hearing aid worn   . HOH (hard of hearing)    both ears  . Hyperlipidemia   . Hypertension    echo nl lv function  mild dilitation 2009  . Kidney infection    few yrs ago in hospital  . Medication side effect 09/02/2010   Poss muscle se of 10 crestor   . Migraine    hypnic HA eval by Dr. Earley Favor in the past  . Polycythemia   . 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 Pyelonephritis 03/12/2015  . 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 Syncope 02/01/2011   In shower on vacation  sustained head laceration  8 sutures Had ed visit neg head ct labs and x ray   . Trigeminal neuralgia pain     Family History  Problem Relation Age of Onset  . Ovarian cancer Mother   . Stroke Mother   . Alcohol abuse Father   . Stroke Father   . Diabetes Brother   . Cancer Paternal Aunt        leukemia, unknown type  . Seizures  Daughter   . Hypertension Other   . Colon cancer Neg Hx     Past Surgical History:  Procedure Laterality Date  . ABDOMINAL HYSTERECTOMY  2002   tubal  . CARDIAC CATHETERIZATION  2000   chest pains neg  . CHOLECYSTECTOMY N/A 02/21/2017   Procedure: LAPAROSCOPIC CHOLECYSTECTOMY WITH INTRAOPERATIVE CHOLANGIOGRAM;  Surgeon: Armandina Gemma, MD;  Location: [facility];  Service: General;  Laterality: N/A;  . COLONOSCOPY     multiple  . CRANIOTOMY  12/09/2011   nerve decompression right trigeminal   . DOPPLER ECHOCARDIOGRAPHY  2009   nl lv function mild lv dilitation  . EYE SURGERY Bilateral    ioc for catatracts  .  laparoscopic gallbladder surgery  02/16/2017   Fax from [facility]  . OOPHORECTOMY Bilateral 2002  . rt shoulder surgery     Social History   Occupational History    Comment: retired Forensic psychologist  Tobacco Use  . Smoking status: Never Smoker  . Smokeless tobacco: Never Used  Substance and Sexual Activity  . Alcohol use: Yes    Alcohol/week: 2.0 standard drinks    Types: 2 Glasses of wine per week    Comment: occ wine  . Drug use: No  . Sexual activity: Not on file

## 2019-10-14 NOTE — Progress Notes (Signed)
Sodium and potassium are stable  On the diuretic so far

## 2019-10-16 ENCOUNTER — Telehealth: Payer: Self-pay | Admitting: Internal Medicine

## 2019-10-16 DIAGNOSIS — R296 Repeated falls: Secondary | ICD-10-CM | POA: Diagnosis not present

## 2019-10-16 DIAGNOSIS — Z9181 History of falling: Secondary | ICD-10-CM | POA: Diagnosis not present

## 2019-10-16 DIAGNOSIS — M5416 Radiculopathy, lumbar region: Secondary | ICD-10-CM | POA: Diagnosis not present

## 2019-10-16 DIAGNOSIS — M25512 Pain in left shoulder: Secondary | ICD-10-CM | POA: Diagnosis not present

## 2019-10-16 DIAGNOSIS — M25511 Pain in right shoulder: Secondary | ICD-10-CM | POA: Diagnosis not present

## 2019-10-16 DIAGNOSIS — M48062 Spinal stenosis, lumbar region with neurogenic claudication: Secondary | ICD-10-CM | POA: Diagnosis not present

## 2019-10-16 DIAGNOSIS — M545 Low back pain: Secondary | ICD-10-CM | POA: Diagnosis not present

## 2019-10-16 DIAGNOSIS — M7138 Other bursal cyst, other site: Secondary | ICD-10-CM | POA: Diagnosis not present

## 2019-10-16 NOTE — Telephone Encounter (Signed)
Called patient and not able to leave a message.   Patient has a refill on file at [facility] and needs to contact her pharmacy to have this prescription filled.

## 2019-10-16 NOTE — Telephone Encounter (Signed)
Pt call and need a new RX carvedilol (COREG) 6.25 MG tablet sent to [place], [place] AT [facility] [place] Phone:  [phone]  Fax:  [phone]

## 2019-10-18 NOTE — Telephone Encounter (Signed)
Some improvement   Continue  With bp readings   And lasix   In 1 week send in 3 days of readings

## 2019-10-19 ENCOUNTER — Other Ambulatory Visit: Payer: Self-pay | Admitting: Family Medicine

## 2019-10-22 ENCOUNTER — Other Ambulatory Visit: Payer: Self-pay

## 2019-10-22 MED ORDER — CARVEDILOL 12.5 MG PO TABS: 12.5000 mg | ORAL_TABLET | Freq: Two times a day (BID) | ORAL | 3 refills | Status: DC

## 2019-10-24 ENCOUNTER — Encounter (HOSPITAL_BASED_OUTPATIENT_CLINIC_OR_DEPARTMENT_OTHER): Payer: Self-pay

## 2019-10-24 ENCOUNTER — Emergency Department (HOSPITAL_BASED_OUTPATIENT_CLINIC_OR_DEPARTMENT_OTHER): Payer: Medicare Other

## 2019-10-24 ENCOUNTER — Other Ambulatory Visit: Payer: Self-pay

## 2019-10-24 ENCOUNTER — Encounter: Payer: Self-pay | Admitting: Family Medicine

## 2019-10-24 ENCOUNTER — Telehealth (INDEPENDENT_AMBULATORY_CARE_PROVIDER_SITE_OTHER): Payer: Medicare Other | Admitting: Family Medicine

## 2019-10-24 ENCOUNTER — Emergency Department (HOSPITAL_BASED_OUTPATIENT_CLINIC_OR_DEPARTMENT_OTHER)
Admission: EM | Admit: 2019-10-24 | Discharge: 2019-10-24 | Disposition: A | Discharge status: Home / Self Care | Payer: Medicare Other | Attending: Emergency Medicine | Admitting: Emergency Medicine

## 2019-10-24 VITALS — BP 196/111

## 2019-10-24 DIAGNOSIS — R519 Headache, unspecified: Secondary | ICD-10-CM | POA: Insufficient documentation

## 2019-10-24 DIAGNOSIS — R296 Repeated falls: Secondary | ICD-10-CM | POA: Diagnosis not present

## 2019-10-24 DIAGNOSIS — M25511 Pain in right shoulder: Secondary | ICD-10-CM | POA: Diagnosis not present

## 2019-10-24 DIAGNOSIS — Z79899 Other long term (current) drug therapy: Secondary | ICD-10-CM | POA: Diagnosis not present

## 2019-10-24 DIAGNOSIS — I160 Hypertensive urgency: Secondary | ICD-10-CM

## 2019-10-24 DIAGNOSIS — M25512 Pain in left shoulder: Secondary | ICD-10-CM | POA: Diagnosis not present

## 2019-10-24 DIAGNOSIS — I10 Essential (primary) hypertension: Secondary | ICD-10-CM | POA: Diagnosis not present

## 2019-10-24 DIAGNOSIS — Z9181 History of falling: Secondary | ICD-10-CM | POA: Diagnosis not present

## 2019-10-24 DIAGNOSIS — Z882 Allergy status to sulfonamides status: Secondary | ICD-10-CM | POA: Diagnosis not present

## 2019-10-24 LAB — BASIC METABOLIC PANEL
Anion gap: 10 (ref 5–15)
BUN: 19 mg/dL (ref 8–23)
CO2: 27 mmol/L (ref 22–32)
Calcium: 9.3 mg/dL (ref 8.9–10.3)
Chloride: 100 mmol/L (ref 98–111)
Creatinine, Ser: 0.8 mg/dL (ref 0.44–1.00)
GFR calc Af Amer: >60 mL/min (ref >60)
GFR calc non Af Amer: >60 mL/min (ref >60)
Glucose, Bld: 99 mg/dL (ref 70–99)
Potassium: 5 mmol/L (ref 3.5–5.1)
Sodium: 137 mmol/L (ref 135–145)

## 2019-10-24 LAB — CBC WITH DIFFERENTIAL/PLATELET
Abs Immature Granulocytes: 0.01 10*3/uL (ref 0.00–0.07)
Basophils Absolute: 0.1 10*3/uL (ref 0.0–0.1)
Basophils Relative: 1 %
Eosinophils Absolute: 0.3 10*3/uL (ref 0.0–0.5)
Eosinophils Relative: 6 %
HCT: 46.6 % — ABNORMAL HIGH (ref 36.0–46.0)
Hemoglobin: 15 g/dL (ref 12.0–15.0)
Immature Granulocytes: 0 %
Lymphocytes Relative: 19 %
Lymphs Abs: 1.1 10*3/uL (ref 0.7–4.0)
MCH: 30.4 pg (ref 26.0–34.0)
MCHC: 32.2 g/dL (ref 30.0–36.0)
MCV: 94.3 fL (ref 80.0–100.0)
Monocytes Absolute: 0.6 10*3/uL (ref 0.1–1.0)
Monocytes Relative: 10 %
Neutro Abs: 3.7 10*3/uL (ref 1.7–7.7)
Neutrophils Relative %: 64 %
Platelets: 208 10*3/uL (ref 150–400)
RBC: 4.94 MIL/uL (ref 3.87–5.11)
RDW: 13.2 % (ref 11.5–15.5)
WBC: 5.8 10*3/uL (ref 4.0–10.5)
nRBC: 0 % (ref 0.0–0.2)

## 2019-10-24 NOTE — ED Triage Notes (Signed)
Pt reports hx of HTN-states her BP was elevated during PT today-c/o HA x 30 min-pt denies CP-NAD-steady gait

## 2019-10-24 NOTE — ED Notes (Addendum)
Pt given water for fluid challenge and crackers. She took her own meds per Dr. Tamera Punt

## 2019-10-24 NOTE — ED Provider Notes (Signed)
[facility] EMERGENCY DEPARTMENT Provider Note   CSN: [id number] Arrival date & time: 10/24/19  1654     History Chief Complaint  Patient presents with  . Hypertension    [name] is a 79 y.o. female.  Patient is a 79 year old female who presents with high blood pressure.  She has a history of hypertension and states that her blood pressure has been ranging in 169 range.  Her doctors been adjusting her medicines over the last few weeks trying to get it better controlled.  Today she went to physical therapy and noticed that it was elevated in the 841 range systolic.  She was feeling a little bit shaky.  She still feels a little bit shaky now.  She had headache on arrival but says is improved.  She thinks that she took her blood pressure medicines this morning but has not taken her evening dose yet.  She denies any chest pain or shortness of breath.  No other recent illnesses.  No recent head injury.  She is not on anticoagulants.  No fevers or other recent illnesses.  No numbness or weakness to her extremities.  No speech deficits or vision changes.        Past Medical History:  Diagnosis Date  . Abdominal pain 05/29/2013   s/p rx of cephalo resistant e coli   but last rx NG  now residular ?  bladder sx repeat cx sx rx to ty and uro consult   . ADJ DISORDER WITH MIXED ANXIETY & DEPRESSED MOOD 03/03/2010   Qualifier: Diagnosis of  By: [name], [name]   . Closed head injury 02/01/2011   from syncope and had scalp laceration  neg ct .    Marland Kitchen Closed head injury 5-6 yrs ago  . Colitis 11/27/2017  . Complication of anesthesia    migraine several hours after general anesthesia  . Fatty liver   . Gall stones 2016   see ct scan neg HIDA   . GERD (gastroesophageal reflux disease)   . Hearing aid worn   . HOH (hard of hearing)    both ears  . Hyperlipidemia   . Hypertension    echo nl lv function  mild dilitation 2009  . Kidney infection    few yrs ago in hospital   . Medication side effect 09/02/2010   Poss muscle se of 10 crestor   . Migraine    hypnic HA eval by Dr. Earley Favor in the past  . Polycythemia   . 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 Pyelonephritis 03/12/2015  . 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 Syncope 02/01/2011   In shower on vacation  sustained head laceration  8 sutures Had ed visit neg head ct labs and x ray   . Trigeminal neuralgia pain     Patient Active Problem List   Diagnosis Date Noted  . 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 Radiculopathy due to lumbar intervertebral disc disorder 03/27/2019  . 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 Diarrhea 11/27/2017  . Closed head injury 11/27/2015  . Unilateral headache 05/26/2015  . ARF (acute renal failure) ([place]) 03/12/2015  . Gallstones 03/12/2015  . Nausea without vomiting 12/10/2014  . Essential hypertension 05/29/2014  . 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 Agent resistant to multiple antibiotics 05/29/2013  . Low back pain radiating to right leg 01/16/2013  . Sinus problem 11/20/2012  .  Chronic headaches morning 10/19/2012  . Low sodium levels  133 10/19/2012  . Leg pain, posterior 08/16/2012  . Sciatic neuritis 08/16/2012  . Sleep disturbance, unspecified 08/16/2012  . Medication management 03/24/2012  . Preventative health care 09/24/2011  . Trigeminal neuralgia 09/24/2011  . Postmenopausal HRT (hormone replacement therapy) 09/24/2011  . Polycythemia 09/20/2011  . Morning headache 02/01/2011  . Hearing aid worn   . Abnormal blood finding   . LOCALIZED SUPERFICIAL SWELLING MASS OR LUMP 09/21/2009  . DYSPNEA 05/07/2008  . Hypnic headache 03/19/2008  . 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 Headache(784.0) 06/05/2007  . JAW PAIN 04/06/2007  . OSTEOARTHRITIS 04/06/2007  . ABNORMAL RESULT, FUNCTION STUDY, Gisela 02/05/2007  .  HYPERLIPIDEMIA 12/25/2006  . HYPERTENSION 12/25/2006    Past Surgical History:  Procedure Laterality Date  . ABDOMINAL HYSTERECTOMY  2002   tubal  . CARDIAC CATHETERIZATION  2000   chest pains neg  . CHOLECYSTECTOMY N/A 02/21/2017   Procedure: LAPAROSCOPIC CHOLECYSTECTOMY WITH INTRAOPERATIVE CHOLANGIOGRAM;  Surgeon: Armandina Gemma, MD;  Location: [facility];  Service: General;  Laterality: N/A;  . COLONOSCOPY     multiple  . CRANIOTOMY  12/09/2011   nerve decompression right trigeminal   . DOPPLER ECHOCARDIOGRAPHY  2009   nl lv function mild lv dilitation  . EYE SURGERY Bilateral    ioc for catatracts  . laparoscopic gallbladder surgery  02/16/2017   Fax from [facility]  . OOPHORECTOMY Bilateral 2002  . rt shoulder surgery       OB History    Gravida  2   Para  2   Term      Preterm      AB      Living        SAB      TAB      Ectopic      Multiple      Live Births              Family History  Problem Relation Age of Onset  . Ovarian cancer Mother   . Stroke Mother   . Alcohol abuse Father   . Stroke Father   . Diabetes Brother   . Cancer Paternal Aunt        leukemia, unknown type  . Seizures Daughter   . Hypertension Other   . Colon cancer Neg Hx     Social History   Tobacco Use  . Smoking status: Never Smoker  . Smokeless tobacco: Never Used  Substance Use Topics  . Alcohol use: Yes    Alcohol/week: 2.0 standard drinks    Types: 2 Glasses of wine per week    Comment: occ wine  . Drug use: No    Home Medications Prior to Admission medications   Medication Sig Start Date End Date Taking? Authorizing Provider  Acetaminophen (TYLENOL PO) Take 2 tablets by mouth as needed.    [provider]  albuterol (PROVENTIL HFA;VENTOLIN HFA) 108 (90 Base) MCG/ACT inhaler Inhale 2 puffs into the lungs every 6 (six) hours as needed. 04/26/16   [name], [name], MD  alendronate (FOSAMAX) 70 MG tablet Take 1 tablet (70 mg total) by  mouth every 7 (seven) days. Take with a full glass of water on an empty stomach. 06/14/19   [name], [name], MD  benazepril (LOTENSIN) 20 MG tablet Take 1 tablet (20 mg total) by mouth daily. 08/05/19   [name], [name], MD  budesonide-formoterol (SYMBICORT) 160-4.5 MCG/ACT inhaler Inhale 2  puffs into the lungs 2 (two) times daily. 07/16/15   [name], [name], MD  butalbital-aspirin-caffeine-codeine (ASCOMP-CODEINE) [phone] MG capsule TAKE 1-2 CAPSULES BY MOUTH EVERY FOUR TO SIX HOURS AS NEEDED FOR MIGRAINE RESCUE,MAX OF 6 CAPSULES IN 24 HOURS 03/04/19   [name], [name], MD  carvedilol (COREG) 12.5 MG tablet Take 1 tablet (12.5 mg total) by mouth 2 (two) times daily with a meal. 10/22/19   [name], [name], MD  chlorthalidone (HYGROTON) 25 MG tablet Take 1 tablet (25 mg total) by mouth daily. Patient taking differently: Take 12.5 mg by mouth daily.  06/21/19   [name], [name], MD  conjugated estrogens (PREMARIN) vaginal cream Place 1 Applicatorful vaginally 2 (two) times a week.    [provider]  fluticasone (FLONASE) 50 MCG/ACT nasal spray Place 2 sprays into both nostrils as needed for allergies or rhinitis.     [provider]  furosemide (LASIX) 20 MG tablet Take 1 tablet (20 mg total) by mouth daily. 10/07/19   [name], [name], MD  lamoTRIgine (LAMICTAL) 25 MG tablet Take 1 tablet(40m twice daily for one week then increase to two tablets(549m twice daily Stop immediately for rash. Patient taking differently: Take 50 mg by mouth 2 (two) times daily. Take 1 tablet(2539mwice daily for one week then increase to two tablets(80m62mwice daily Stop immediately for rash. 08/26/19   AherMelvenia Beam  NUCYNTA 50 MG tablet Take 50 mg by mouth every 4 (four) hours as needed. 07/08/19   [provider]  omeprazole (PRILOSEC) 20 MG capsule Take 20 mg by mouth daily.    [provider]  ondansetron (ZOFRAN-ODT) 4 MG disintegrating tablet Take 1-2 tablets (4-8 mg total) by  mouth every 8 (eight) hours as needed for nausea. 09/04/19   AherMelvenia Beam  OVER THE COUNTER MEDICATION OTC eye drops    [provider]  Oxcarbazepine (TRILEPTAL) 300 MG tablet Take 600 mg by mouth 2 (two) times daily. 07/05/19   [provider]    Allergies    Sulfamethoxazole-trimethoprim and Sulfur  Review of Systems   Review of Systems  Constitutional: Negative for chills, diaphoresis, fatigue and fever.  HENT: Negative for congestion, rhinorrhea and sneezing.   Eyes: Negative.   Respiratory: Negative for cough, chest tightness and shortness of breath.   Cardiovascular: Negative for chest pain and leg swelling.  Gastrointestinal: Negative for abdominal pain, blood in stool, diarrhea, nausea and vomiting.  Genitourinary: Negative for difficulty urinating, flank pain, frequency and hematuria.  Musculoskeletal: Negative for arthralgias and back pain.  Skin: Negative for rash.  Neurological: Positive for light-headedness (Shakiness) and headaches. Negative for dizziness, speech difficulty, weakness and numbness.    Physical Exam Updated Vital Signs BP (!) 162/94 (BP Location: Right Arm)   Pulse (!) 59   Temp 97.7 F (36.5 C) (Oral)   Resp 18   Ht 5' 6"  (1.676 m)   Wt 58.5 kg   SpO2 100%   BMI 20.82 kg/m   Physical Exam Constitutional:      Appearance: She is well-developed.  HENT:     Head: Normocephalic and atraumatic.  Eyes:     Pupils: Pupils are equal, round, and reactive to light.  Cardiovascular:     Rate and Rhythm: Normal rate and regular rhythm.     Heart sounds: Normal heart sounds.  Pulmonary:     Effort: Pulmonary effort is normal. No respiratory distress.     Breath sounds: Normal breath sounds.  No wheezing or rales.  Chest:     Chest wall: No tenderness.  Abdominal:     General: Bowel sounds are normal.     Palpations: Abdomen is soft.     Tenderness: There is no abdominal tenderness. There is no guarding or rebound.    Musculoskeletal:        General: Normal range of motion.     Cervical back: Normal range of motion and neck supple.  Lymphadenopathy:     Cervical: No cervical adenopathy.  Skin:    General: Skin is warm and dry.     Findings: No rash.  Neurological:     Mental Status: She is alert and oriented to person, place, and time.     Comments: Motor 5/5 all extremities Sensation grossly intact to LT all extremities Finger to Nose intact, no pronator drift CN II-XII grossly intact Gait normal      ED Results / Procedures / Treatments   Labs (all labs ordered are listed, but only abnormal results are displayed) Labs Reviewed  CBC WITH DIFFERENTIAL/PLATELET - Abnormal; Notable for the following components:      Result Value   HCT 46.6 (*)    All other components within normal limits  BASIC METABOLIC PANEL    EKG None  Radiology CT Head Wo Contrast  Result Date: 10/24/2019 CLINICAL DATA:  Headache EXAM: CT HEAD WITHOUT CONTRAST TECHNIQUE: Contiguous axial images were obtained from the base of the skull through the vertex without intravenous contrast. COMPARISON:  08/10/2019 FINDINGS: Brain: No evidence of acute infarction, hemorrhage, hydrocephalus, extra-axial collection or mass lesion/mass effect. Mild age related cerebral volume loss. Vascular: Mild atherosclerotic calcifications involving the large vessels of the skull base. No unexpected hyperdense vessel. Skull: Prior right occipital craniotomy. No acute calvarial abnormality. Sinuses/Orbits: No acute finding. Other: None. IMPRESSION: 1. No acute intracranial findings. 2. Prior right occipital craniotomy. Electronically Signed   By: Davina Poke D.O.   On: 10/24/2019 19:16    Procedures Procedures (including critical care time)  Medications Ordered in ED Medications - No data to display  ED Course  I have reviewed the triage vital signs and the nursing notes.  Pertinent labs & imaging results that were available during  my care of the patient were reviewed by me and considered in my medical decision making (see chart for details).    MDM Rules/Calculators/A&P                      Patient is an 79 year old female who presents with elevated blood pressures.  She was feeling a little shaky and had a mild headache.  Head CT shows no acute abnormality.  Her labs are nonconcerning.  She took her nightly dose of Coreg.  Her blood pressure has improved to 162/88.  She currently is asymptomatic.  She feels much better and is ready to go home.  I encouraged her to contact her PCP tomorrow regarding ongoing blood pressure management and to monitor her blood pressures at home.  She does have a BP machine at home.  Return precautions were given. Final Clinical Impression(s) / ED Diagnoses Final diagnoses:  Essential hypertension    Rx / DC Orders ED Discharge Orders    None       Malvin Johns, MD 10/24/19 2040

## 2019-10-24 NOTE — Progress Notes (Signed)
Virtual Visit via Telephone Note  I connected with [name] on 10/24/19 at  3:40 PM EDT by telephone and verified that I am speaking with the correct person using two identifiers.   I discussed the limitations, risks, security and privacy concerns of performing an evaluation and management service by telephone and the availability of in person appointments. I also discussed with the patient that there may be a patient responsible charge related to this service. The patient expressed understanding and agreed to proceed.  Location patient: home, [place] Location provider: work or home office Participants present for the call: patient, provider Patient did not have a visit in the prior 7 days to address this/these issue(s).   History of Present Illness:  Acute visit for HTN: -was sent home from her PT session because her BP was elevated and she felt really shaky; now she can't remember if she took her blood medications this morning or not - she thinks she did -her PT suggested calling transport, but she opted to come home -has long history of HTN and has been working with PCP to try to gain better control recently as reports has been high for some time -reports ranging from 130s- 190s/70-90s; but had been better recently until she was at PT today and was not feeling well -reports felt shaky and weak so PT check her vitals and BP was very high at 190s/1 teens -reports current BP medications include benazapril 68m (takes in the evening), coreg 12.566mbid, lasix 2032mreports used to take chlorthalidone and amlodipine - but reports these were stopped by PCP -currently denies HA, Chest Pain, SOB, vision changes -BP now at home and resting is 191/119    Observations/Objective: Patient sounds cheerful and well on the phone. I do not appreciate any SOB. Speech and thought processing are grossly intact. Patient reported vitals:  Assessment and Plan:  Hypertensive urgency  -we  discussed possible serious and likely etiologies, options for evaluation and workup, limitations of telemedicine visit vs in person visit, treatment, treatment risks and precautions. Recommend inperson evaluation given degree of elevation and feeling unwell. Pt [facility] given PCP office is not available. [facility] and my assistant contacted triage to notify them of the case. Pt declined EMS transport and repots has someone to take her there now. Patient agrees to seek prompt in person care if worsening, new symptoms arise, or if is not improving with treatment.  Follow Up Instructions:  I did not refer this patient for an OV in the next 24 hours for this/these issue(s).  I discussed the assessment and treatment plan with the patient. The patient was provided an opportunity to ask questions and all were answered. The patient agreed with the plan and demonstrated an understanding of the instructions.   The patient was advised to call back or seek an in-person evaluation if the symptoms worsen or if the condition fails to improve as anticipated.  I provided 18 minutes of non-face-to-face time during this encounter.   HanLucretia KernO

## 2019-10-25 ENCOUNTER — Other Ambulatory Visit: Payer: Self-pay | Admitting: Neurology

## 2019-10-28 DIAGNOSIS — Z79899 Other long term (current) drug therapy: Secondary | ICD-10-CM

## 2019-10-28 DIAGNOSIS — R0989 Other specified symptoms and signs involving the circulatory and respiratory systems: Secondary | ICD-10-CM

## 2019-10-28 MED ORDER — LAMOTRIGINE 25 MG PO TABS
50.0000 mg | ORAL_TABLET | Freq: Two times a day (BID) | ORAL | 0 refills | Status: DC
Start: 2019-10-28 — End: 2019-11-26

## 2019-10-28 NOTE — Addendum Note (Signed)
Addended by: Gildardo Griffes on: 10/28/2019 05:31 PM   Modules accepted: Orders

## 2019-10-28 NOTE — Telephone Encounter (Signed)
Ok per Dr. Leta Baptist to refill the Lamotrigine and update instructions to reflect pt's current dose of 50 mg twice daily. Refill sent. Pt updated.

## 2019-10-29 ENCOUNTER — Other Ambulatory Visit: Payer: Self-pay | Admitting: Internal Medicine

## 2019-10-29 NOTE — Telephone Encounter (Signed)
OK to continue to fill for patient? She will need a Hospital follow up. Please advise.

## 2019-10-30 DIAGNOSIS — M25512 Pain in left shoulder: Secondary | ICD-10-CM | POA: Diagnosis not present

## 2019-10-30 DIAGNOSIS — M25511 Pain in right shoulder: Secondary | ICD-10-CM | POA: Diagnosis not present

## 2019-10-30 DIAGNOSIS — R296 Repeated falls: Secondary | ICD-10-CM | POA: Diagnosis not present

## 2019-10-30 DIAGNOSIS — Z9181 History of falling: Secondary | ICD-10-CM | POA: Diagnosis not present

## 2019-10-31 ENCOUNTER — Other Ambulatory Visit: Payer: Self-pay

## 2019-10-31 MED ORDER — CARVEDILOL 25 MG PO TABS
25.0000 mg | ORAL_TABLET | Freq: Two times a day (BID) | ORAL | 3 refills | Status: DC
Start: 2019-10-31 — End: 2020-02-29

## 2019-10-31 NOTE — Telephone Encounter (Signed)
Yes we can do a referral  I have placed a referral order to dr Harrell Gave   ( [place]  there is a hypertension clinic in the cardiology division also )  So  For the mean time  Can try   Increase the carvedilol to 25 mg twice a day  And monitor to make sure pulse is 55 and above  .   To review  Your Bp seemed reasonably  controlled until we had to stop the chlorthalidone for hyponatremia   You had  Feet swelling on  Amlodipine  And we changed to carvedilol . And added lasix .

## 2019-10-31 NOTE — Telephone Encounter (Signed)
Yes   To your message about which medications for now   Your memory looks good to get this all correct!Marland Kitchen

## 2019-11-01 ENCOUNTER — Other Ambulatory Visit: Payer: Self-pay | Admitting: Internal Medicine

## 2019-11-01 NOTE — Telephone Encounter (Signed)
So since we just increased the carvedilol dose    but make sure taking  Both doses per day.  There are other meds  Such as clonidine ) we can add on for emergent elevation  ( persistent 180/120 and above but  Have to be cautious with this )   Make  appt with me for next week  Virtual ok . And we can decide if need to   Do more with meds )          .

## 2019-11-01 NOTE — Telephone Encounter (Signed)
Noted and see  Messages after this  And referral plans

## 2019-11-01 NOTE — Telephone Encounter (Signed)
Sorry didn see this message until this am   we dont want to have bp stay that high   How iare readings  doing this am  ?  Make sure relaxing and take 2-3 readings  T

## 2019-11-06 NOTE — Telephone Encounter (Signed)
If you have been  On the carvedilol  25 bid for  Over 2 weeks an not enough \to control the bp readings    We can add clonidine 0.1 mg   Po bid  Disp 60 ( this medication could cause drowsiness and follow pulse rate It  Is a low dose  Still)  Check in with me  In 1 week after beginning

## 2019-11-07 ENCOUNTER — Emergency Department (HOSPITAL_COMMUNITY)
Admission: EM | Admit: 2019-11-07 | Discharge: 2019-11-07 | Disposition: A | Discharge status: Home / Self Care | Payer: Medicare Other | Attending: Emergency Medicine | Admitting: Emergency Medicine

## 2019-11-07 ENCOUNTER — Other Ambulatory Visit: Payer: Self-pay

## 2019-11-07 DIAGNOSIS — I10 Essential (primary) hypertension: Secondary | ICD-10-CM | POA: Diagnosis not present

## 2019-11-07 DIAGNOSIS — M25512 Pain in left shoulder: Secondary | ICD-10-CM | POA: Diagnosis not present

## 2019-11-07 DIAGNOSIS — M25511 Pain in right shoulder: Secondary | ICD-10-CM | POA: Diagnosis not present

## 2019-11-07 DIAGNOSIS — Z9181 History of falling: Secondary | ICD-10-CM | POA: Diagnosis not present

## 2019-11-07 DIAGNOSIS — R531 Weakness: Secondary | ICD-10-CM | POA: Insufficient documentation

## 2019-11-07 DIAGNOSIS — R296 Repeated falls: Secondary | ICD-10-CM | POA: Diagnosis not present

## 2019-11-07 DIAGNOSIS — Z5321 Procedure and treatment not carried out due to patient leaving prior to being seen by health care provider: Secondary | ICD-10-CM | POA: Insufficient documentation

## 2019-11-07 LAB — CBC
HCT: 45.5 % (ref 36.0–46.0)
Hemoglobin: 14.5 g/dL (ref 12.0–15.0)
MCH: 30.8 pg (ref 26.0–34.0)
MCHC: 31.9 g/dL (ref 30.0–36.0)
MCV: 96.6 fL (ref 80.0–100.0)
Platelets: 167 10*3/uL (ref 150–400)
RBC: 4.71 MIL/uL (ref 3.87–5.11)
RDW: 12.9 % (ref 11.5–15.5)
WBC: 5.1 10*3/uL (ref 4.0–10.5)
nRBC: 0 % (ref 0.0–0.2)

## 2019-11-07 LAB — URINALYSIS, ROUTINE W REFLEX MICROSCOPIC
Bacteria, UA: NONE SEEN
Bilirubin Urine: NEGATIVE
Glucose, UA: NEGATIVE mg/dL
Hgb urine dipstick: NEGATIVE
Ketones, ur: NEGATIVE mg/dL
Leukocytes,Ua: NEGATIVE
Nitrite: NEGATIVE
Protein, ur: NEGATIVE mg/dL
Specific Gravity, Urine: 1.009 (ref 1.005–1.030)
pH: 7 (ref 5.0–8.0)

## 2019-11-07 LAB — BASIC METABOLIC PANEL
Anion gap: 9 (ref 5–15)
BUN: 19 mg/dL (ref 8–23)
CO2: 27 mmol/L (ref 22–32)
Calcium: 9.4 mg/dL (ref 8.9–10.3)
Chloride: 106 mmol/L (ref 98–111)
Creatinine, Ser: 0.9 mg/dL (ref 0.44–1.00)
GFR calc Af Amer: >60 mL/min (ref >60)
GFR calc non Af Amer: >60 mL/min (ref >60)
Glucose, Bld: 101 mg/dL — ABNORMAL HIGH (ref 70–99)
Potassium: 3.8 mmol/L (ref 3.5–5.1)
Sodium: 142 mmol/L (ref 135–145)

## 2019-11-07 MED ORDER — CLONIDINE HCL 0.1 MG PO TABS
0.1000 mg | ORAL_TABLET | Freq: Two times a day (BID) | ORAL | 0 refills | Status: DC
Start: 2019-11-07 — End: 2019-12-03

## 2019-11-07 NOTE — ED Triage Notes (Signed)
Pt here for eval of generalized weakness since this morning. Denies one sided weakness or numbness. Reports her blood pressure being higher than normal, currently changing medications/dosages for antihypertensives.

## 2019-11-12 DIAGNOSIS — M25512 Pain in left shoulder: Secondary | ICD-10-CM | POA: Diagnosis not present

## 2019-11-12 DIAGNOSIS — M25511 Pain in right shoulder: Secondary | ICD-10-CM | POA: Diagnosis not present

## 2019-11-12 DIAGNOSIS — Z9181 History of falling: Secondary | ICD-10-CM | POA: Diagnosis not present

## 2019-11-12 DIAGNOSIS — R296 Repeated falls: Secondary | ICD-10-CM | POA: Diagnosis not present

## 2019-11-14 DIAGNOSIS — M25511 Pain in right shoulder: Secondary | ICD-10-CM | POA: Diagnosis not present

## 2019-11-14 DIAGNOSIS — M25512 Pain in left shoulder: Secondary | ICD-10-CM | POA: Diagnosis not present

## 2019-11-14 DIAGNOSIS — Z9181 History of falling: Secondary | ICD-10-CM | POA: Diagnosis not present

## 2019-11-14 DIAGNOSIS — R296 Repeated falls: Secondary | ICD-10-CM | POA: Diagnosis not present

## 2019-11-20 DIAGNOSIS — R296 Repeated falls: Secondary | ICD-10-CM | POA: Diagnosis not present

## 2019-11-20 DIAGNOSIS — Z9181 History of falling: Secondary | ICD-10-CM | POA: Diagnosis not present

## 2019-11-20 DIAGNOSIS — M25511 Pain in right shoulder: Secondary | ICD-10-CM | POA: Diagnosis not present

## 2019-11-20 DIAGNOSIS — M25512 Pain in left shoulder: Secondary | ICD-10-CM | POA: Diagnosis not present

## 2019-11-25 ENCOUNTER — Other Ambulatory Visit: Payer: Self-pay | Admitting: Internal Medicine

## 2019-11-25 ENCOUNTER — Other Ambulatory Visit: Payer: Self-pay | Admitting: Neurology

## 2019-11-26 DIAGNOSIS — M25511 Pain in right shoulder: Secondary | ICD-10-CM | POA: Diagnosis not present

## 2019-11-26 DIAGNOSIS — M25512 Pain in left shoulder: Secondary | ICD-10-CM | POA: Diagnosis not present

## 2019-11-26 DIAGNOSIS — Z9181 History of falling: Secondary | ICD-10-CM | POA: Diagnosis not present

## 2019-11-26 DIAGNOSIS — R296 Repeated falls: Secondary | ICD-10-CM | POA: Diagnosis not present

## 2019-11-27 DIAGNOSIS — M48062 Spinal stenosis, lumbar region with neurogenic claudication: Secondary | ICD-10-CM | POA: Diagnosis not present

## 2019-11-27 DIAGNOSIS — M545 Low back pain: Secondary | ICD-10-CM | POA: Diagnosis not present

## 2019-11-27 DIAGNOSIS — G500 Trigeminal neuralgia: Secondary | ICD-10-CM | POA: Diagnosis not present

## 2019-11-27 DIAGNOSIS — M5416 Radiculopathy, lumbar region: Secondary | ICD-10-CM | POA: Diagnosis not present

## 2019-11-28 DIAGNOSIS — Z9181 History of falling: Secondary | ICD-10-CM | POA: Diagnosis not present

## 2019-11-28 DIAGNOSIS — M25511 Pain in right shoulder: Secondary | ICD-10-CM | POA: Diagnosis not present

## 2019-11-28 DIAGNOSIS — R296 Repeated falls: Secondary | ICD-10-CM | POA: Diagnosis not present

## 2019-11-28 DIAGNOSIS — M25512 Pain in left shoulder: Secondary | ICD-10-CM | POA: Diagnosis not present

## 2019-11-29 ENCOUNTER — Other Ambulatory Visit: Payer: Self-pay | Admitting: Neurosurgery

## 2019-11-29 DIAGNOSIS — M5416 Radiculopathy, lumbar region: Secondary | ICD-10-CM

## 2019-11-29 NOTE — Telephone Encounter (Signed)
Prefer  Dr Tomi Likens help  And given opinion since he saw you for you migraines .  One new medication is called nurtec ODT( dissolvable tabs) and  This may be helpful but is very expensive   And not sure how covered by medicare products.

## 2019-12-02 NOTE — Telephone Encounter (Signed)
Dr. Regis Bill, I have not seen this patient since 2019 and she has since established care with [facility] (seen even this year)

## 2019-12-03 ENCOUNTER — Other Ambulatory Visit: Payer: Self-pay | Admitting: Internal Medicine

## 2019-12-04 ENCOUNTER — Ambulatory Visit: Payer: Medicare Other | Admitting: Cardiology

## 2019-12-04 ENCOUNTER — Encounter: Payer: Self-pay | Admitting: Cardiology

## 2019-12-04 ENCOUNTER — Other Ambulatory Visit: Payer: Self-pay

## 2019-12-04 VITALS — BP 115/78 | HR 58 | Temp 97.0°F | Ht 66.0 in | Wt 128.4 lb

## 2019-12-04 DIAGNOSIS — R0989 Other specified symptoms and signs involving the circulatory and respiratory systems: Secondary | ICD-10-CM | POA: Diagnosis not present

## 2019-12-04 DIAGNOSIS — R296 Repeated falls: Secondary | ICD-10-CM | POA: Diagnosis not present

## 2019-12-04 DIAGNOSIS — Z7189 Other specified counseling: Secondary | ICD-10-CM | POA: Diagnosis not present

## 2019-12-04 DIAGNOSIS — R600 Localized edema: Secondary | ICD-10-CM | POA: Diagnosis not present

## 2019-12-04 DIAGNOSIS — M25511 Pain in right shoulder: Secondary | ICD-10-CM | POA: Diagnosis not present

## 2019-12-04 DIAGNOSIS — E7800 Pure hypercholesterolemia, unspecified: Secondary | ICD-10-CM

## 2019-12-04 DIAGNOSIS — Z9181 History of falling: Secondary | ICD-10-CM | POA: Diagnosis not present

## 2019-12-04 DIAGNOSIS — M25512 Pain in left shoulder: Secondary | ICD-10-CM | POA: Diagnosis not present

## 2019-12-04 MED ORDER — BUTALBITAL-ASA-CAFF-CODEINE 50-325-40-30 MG PO CAPS: ORAL_CAPSULE | ORAL | 0 refills | Status: DC

## 2019-12-04 NOTE — Telephone Encounter (Signed)
I was able to send it in

## 2019-12-04 NOTE — Telephone Encounter (Signed)
So  I sent a message to dr Tomi Likens and he also noted that  You have not  been an active patient  In his  Practice .  I can send in a few   Of the fiorinal but  I  Would like you to see your current  neurology team for headache help  and see if they advise one of the new  meds  Such as Nurtec previously noted.   Narcotic  Rescue is  Non preferred  Will send a message to dr Jaynee Eagles   So my prescribing account is not working today and will have to  See if can be opened  Up tomorrow     I

## 2019-12-04 NOTE — Patient Instructions (Addendum)
Medication Instructions:  Your Physician recommend you continue on your current medication as directed.    *If you need a refill on your cardiac medications before your next appointment, please call your pharmacy*   Lab Work: None   Testing/Procedures: Your physician has requested that you have an echocardiogram. Echocardiography is a painless test that uses sound waves to create images of your heart. It provides your doctor with information about the size and shape of your heart and how well your heart's chambers and valves are working. This procedure takes approximately one hour. There are no restrictions for this procedure. [place] [address]    Follow-Up: At [facility], you and your health needs are our priority.  As part of our continuing mission to provide you with exceptional heart care, we have created designated Provider Care Teams.  These Care Teams include your primary Cardiologist (physician) and Advanced Practice Providers (APPs -  Physician Assistants and Nurse Practitioners) who all work together to provide you with the care you need, when you need it.  We recommend signing up for the patient portal called "MyChart".  Sign up information is provided on this After Visit Summary.  MyChart is used to connect with patients for Virtual Visits (Telemedicine).  Patients are able to view lab/test results, encounter notes, upcoming appointments, etc.  Non-urgent messages can be sent to your provider as well.   To learn more about what you can do with MyChart, go to [email or web address].    Your next appointment:   3 month(s)  The format for your next appointment:   In Person  Provider:   [name], MD   Other Instructions -counseled on how to check blood pressure:  -sit comfortably in a chair, feet uncrossed and flat on floor, for 5-10 minutes  -arm ideally should rest at the level of the heart. However, arm should be relaxed and not tense (for  example, do not hold the arm up unsupported)  -avoid exercise, caffeine, and tobacco for at least 30 minutes prior to BP reading  -don't take BP cuff reading over clothes (always place on skin directly)  -I prefer to know how well the medication is working, so I would like you to take your readings 1-2 hours after taking your blood pressure medication if possible. Mid day numbers are likely best for you.

## 2019-12-04 NOTE — Progress Notes (Signed)
Cardiology Office Note:    Date:  12/04/2019   ID:  [name], DOB 10-22-1940, MRN [id number]  PCP:  [name], MD  Cardiologist:  [name], MD  Referring MD: [name], MD   CC: new patient evaluation for labile blood pressure  History of Present Illness:    [name] is a 79 y.o. female with a hx of lower extremity edema, hypertension, hyperlipidemia who is seen as a new consult at the request of [name], Angela Brooking, MD for the evaluation and management of labile blood pressure.  Dr. Velora Barrera notes reviewed. Has history of LE edema, labile blood pressure. Complicated by hyponatremia limiting diuretic use. She had amlodipine decreased due to edema, but per telephone notes blood pressures were still elevated. She was started on carvedilol 6.25 mg BID in mid-May, then titrated up to 12.5 mg BID. Chlorthalidone was stopped and changed to furosemide. Had an episodes 6.3.21 when her PT session was cancelled due to very elevated blood pressure (reported as 190s/110s). She had video visit and then ER visit that day for elevated blood pressure. In the ER, noted to be 162/94. Her carvedilol was increased to 25 mg BID on 10/31/19, and she was referred to cardiology for further evaluation. On 11/06/19 clonidine 0.1 mg BID was added for persistently elevated blood pressure.  Today: Has had two incidents when her blood pressure has been over 200, both times she went to ER. Once her BP came down when she took her home meds, and the second time was 10/24/19 when she went to PT. Was elevated on triage, sat in waiting room, but went home and took medications, BP improved. Both of these times, felt shaky, weak on her feet. No neurological symptoms.  Takes blood pressure three times a day at home. Usually highest in the morning before she takes her medications. Can be 160s/90s before medications. Mid day in the 130s-140s/80s, sometimes even 110s/70s. In the evening,  usually shortly after PM medications, well controlled.  Was on amlodipine. Did have improvement in her ankle swelling, though still has some residual swelling most days. Weighs herself every day, has lost 50 lbs over the last two years due to inability to eat from trigeminal neuralgia. Has been trying to improve her weight, lowest was 123 lbs. Now actively trying to gain weight.   Has occasional low blood pressures, 100/60. Has felt dizzy and lightheaded with this. Discussed risk of hypotension the well.   Meds: Benazepril 20 mg at night Carvedilol 25 mg twice a day Furosemide 20 mg in the morning Clonidine 0.1 mg twice a day  Denies chest pain, shortness of breath at rest or with normal exertion. No PND, orthopnea, worsening LE edema or unexpected weight gain. No syncope or palpitations.  ROS positive for trigeminal neuralgia, sinus congestion, frequent headaches especially in the AM, frequent migraines.  Husband just had open heart surgery, she wants to make sure her heart is ok. Parents both had high blood pressure. [place], didn't get frequent medical care/specialty care, unsure if they had other issues.  Past Medical History:  Diagnosis Date  . Abdominal pain 05/29/2013   s/p rx of cephalo resistant e coli   but last rx NG  now residular ?  bladder sx repeat cx sx rx to ty and uro consult   . ADJ DISORDER WITH MIXED ANXIETY & DEPRESSED MOOD 03/03/2010   Qualifier: Diagnosis of  By: [name], [name]   . Closed  head injury 02/01/2011   from syncope and had scalp laceration  neg ct .    Marland Kitchen Closed head injury 5-6 yrs ago  . Colitis 11/27/2017  . Complication of anesthesia    migraine several hours after general anesthesia  . Fatty liver   . Gall stones 2016   see ct scan neg HIDA   . GERD (gastroesophageal reflux disease)   . Hearing aid worn   . HOH (hard of hearing)    both ears  . Hyperlipidemia   . Hypertension    echo nl lv function  mild dilitation 2009  .  Kidney infection    few yrs ago in hospital  . Medication side effect 09/02/2010   Poss muscle se of 10 crestor   . Migraine    hypnic HA eval by Dr. Earley Favor in the past  . Polycythemia   . 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 Pyelonephritis 03/12/2015  . 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 Syncope 02/01/2011   In shower on vacation  sustained head laceration  8 sutures Had ed visit neg head ct labs and x ray   . Trigeminal neuralgia pain     Past Surgical History:  Procedure Laterality Date  . ABDOMINAL HYSTERECTOMY  2002   tubal  . CARDIAC CATHETERIZATION  2000   chest pains neg  . CHOLECYSTECTOMY N/A 02/21/2017   Procedure: LAPAROSCOPIC CHOLECYSTECTOMY WITH INTRAOPERATIVE CHOLANGIOGRAM;  Surgeon: Armandina Gemma, MD;  Location: [facility];  Service: General;  Laterality: N/A;  . COLONOSCOPY     multiple  . CRANIOTOMY  12/09/2011   nerve decompression right trigeminal   . DOPPLER ECHOCARDIOGRAPHY  2009   nl lv function mild lv dilitation  . EYE SURGERY Bilateral    ioc for catatracts  . laparoscopic gallbladder surgery  02/16/2017   Fax from [facility]  . OOPHORECTOMY Bilateral 2002  . rt shoulder surgery      Current Medications: Current Outpatient Medications on File Prior to Visit  Medication Sig  . Acetaminophen (TYLENOL PO) Take 2 tablets by mouth as needed.  Marland Kitchen albuterol (PROVENTIL HFA;VENTOLIN HFA) 108 (90 Base) MCG/ACT inhaler Inhale 2 puffs into the lungs every 6 (six) hours as needed.  Marland Kitchen alendronate (FOSAMAX) 70 MG tablet Take 1 tablet (70 mg total) by mouth every 7 (seven) days. Take with a full glass of water on an empty stomach.  . benazepril (LOTENSIN) 20 MG tablet Take 1 tablet (20 mg total) by mouth daily.  . budesonide-formoterol (SYMBICORT) 160-4.5 MCG/ACT inhaler Inhale 2 puffs into the lungs 2 (two) times daily.  . butalbital-aspirin-caffeine-codeine (ASCOMP-CODEINE) 50-325-40-30 MG capsule TAKE 1-2 CAPSULES BY MOUTH  EVERY FOUR TO SIX HOURS AS NEEDED FOR MIGRAINE RESCUE,MAX OF 6 CAPSULES IN 24 HOURS  . carvedilol (COREG) 25 MG tablet Take 1 tablet (25 mg total) by mouth 2 (two) times daily with a meal.  . chlorthalidone (HYGROTON) 25 MG tablet Take 1 tablet (25 mg total) by mouth daily. (Patient taking differently: Take 12.5 mg by mouth daily. )  . cloNIDine (CATAPRES) 0.1 MG tablet TAKE 1 TABLET(0.1 MG) BY MOUTH TWICE DAILY  . conjugated estrogens (PREMARIN) vaginal cream Place 1 Applicatorful vaginally 2 (two) times a week.  . fluticasone (FLONASE) 50 MCG/ACT nasal spray Place 2 sprays into both nostrils as needed for allergies or rhinitis.   . furosemide (LASIX) 20 MG tablet TAKE 1 TABLET(20 MG) BY MOUTH DAILY  . lamoTRIgine (LAMICTAL)  25 MG tablet TAKE 2 TABLETS(50 MG) BY MOUTH TWICE DAILY  . NUCYNTA 50 MG tablet Take 50 mg by mouth every 4 (four) hours as needed.  Marland Kitchen omeprazole (PRILOSEC) 20 MG capsule Take 20 mg by mouth daily.  . ondansetron (ZOFRAN-ODT) 4 MG disintegrating tablet Take 1-2 tablets (4-8 mg total) by mouth every 8 (eight) hours as needed for nausea.  Marland Kitchen OVER THE COUNTER MEDICATION OTC eye drops  . Oxcarbazepine (TRILEPTAL) 300 MG tablet Take 600 mg by mouth 2 (two) times daily.   No current facility-administered medications on file prior to visit.     Allergies:   Sulfamethoxazole-trimethoprim and Sulfur   Social History   Tobacco Use  . Smoking status: Never Smoker  . Smokeless tobacco: Never Used  Vaping Use  . Vaping Use: Never used  Substance Use Topics  . Alcohol use: Yes    Alcohol/week: 2.0 standard drinks    Types: 2 Glasses of wine per week    Comment: occ wine  . Drug use: No    Family History: family history includes Alcohol abuse in her father; Cancer in her paternal aunt; Diabetes in her brother; Hypertension in an other family member; Ovarian cancer in her mother; Seizures in her daughter; Stroke in her father and mother. There is no history of Colon  cancer.  ROS:   Please see the history of present illness.  Additional pertinent ROS: Constitutional: Negative for chills, fever, night sweats. Positive unintentional weight loss from trigeminal neuralgia HENT: Negative for ear pain and hearing loss.   Eyes: Negative for loss of vision and eye pain.  Respiratory: Negative for cough, sputum, wheezing.   Cardiovascular: See HPI. Gastrointestinal: Negative for abdominal pain, melena, and hematochezia.  Genitourinary: Negative for dysuria and hematuria.  Musculoskeletal: Negative for Barrera and myalgias.  Skin: Negative for itching and rash.  Neurological: Negative for focal weakness and loss of consciousness.  Endo/Heme/Allergies: Does not bruise/bleed easily.    ROS positive for trigeminal neuralgia, sinus congestion, frequent headaches especially in the AM, frequent migraines.   EKGs/Labs/Other Studies Reviewed:    The following studies were reviewed today: Echo 11/28/2017 - Left ventricle: The cavity size was normal. There was moderate  concentric hypertrophy. Systolic function was normal. The  estimated ejection fraction was in the range of 55% to 60%. Wall  motion was normal; there were no regional wall motion  abnormalities. Features are consistent with a pseudonormal left  ventricular filling pattern, with concomitant abnormal relaxation  and increased filling pressure (grade 2 diastolic dysfunction).  - Pulmonary arteries: PA peak pressure: 37 mm Hg (S).  - Pericardium, extracardiac: Ascites was noted.   Impressions:   - Normal LV ejection fraction and no regional wall motion  abnormalities, diastolic dysfunction noted. Elevated right sided  filling pressures. Ascites present.   EKG:  EKG is personally reviewed.  The ekg ordered today demonstrates sinus bradycardia at 58 bpm  Recent Labs: 05/27/2019: TSH 0.54 08/29/2019: ALT 18 11/07/2019: BUN 19; Creatinine, Ser 0.90; Hemoglobin 14.5; Platelets 167; Potassium  3.8; Sodium 142  Recent Lipid Panel    Component Value Date/Time   CHOL 279 (H) 05/27/2019 1045   TRIG 78.0 05/27/2019 1045   TRIG 192 (H) 03/17/2006 0820   HDL 118.80 05/27/2019 1045   CHOLHDL 2 05/27/2019 1045   VLDL 15.6 05/27/2019 1045   LDLCALC 145 (H) 05/27/2019 1045   LDLDIRECT 135.9 10/16/2012 0840    Physical Exam:    VS:  BP 115/78   Pulse Marland Kitchen)  58   Temp (!) 97 F (36.1 C)   Ht 5' 6"  (1.676 m)   Wt 128 lb 6.4 oz (58.2 kg)   SpO2 98%   BMI 20.72 kg/m     Wt Readings from Last 3 Encounters:  11/07/19 128 lb 0.5 oz (58.1 kg)  10/24/19 129 lb (58.5 kg)  09/16/19 129 lb 6.4 oz (58.7 kg)    GEN: Well nourished, well developed in no acute distress HEENT: Normal, moist mucous membranes NECK: No JVD CARDIAC: regular rhythm, normal S1 and S2, no rubs or gallops. No murmurs. VASCULAR: Radial and DP pulses 2+ bilaterally. No carotid bruits RESPIRATORY:  Clear to auscultation without rales, wheezing or rhonchi  ABDOMEN: Soft, non-tender, non-distended MUSCULOSKELETAL:  Ambulates independently SKIN: Warm and dry, bilateral mild nonpitting pedal edema NEUROLOGIC:  Alert and oriented x 3. No focal neuro deficits noted. PSYCHIATRIC:  Normal affect    ASSESSMENT:    1. Bilateral leg edema   2. Labile hypertension   3. Cardiac risk counseling   4. Counseling on health promotion and disease prevention   5. Hypercholesterolemia    PLAN:    Lower extremity edema: -ECG sinus bradycardia today -with this plus labile hypertension, will recheck echo  Labile hypertension -blood pressure well controlled today -would not overtreat as she reports intermittent hypotension -instructed on taking home BP -instructed that pain, illness, stress, etc can raise blood pressure -hyponatremia limites diuretic use -currently on: Benazepril 20 mg at night Carvedilol 25 mg twice a day Furosemide 20 mg in the morning Clonidine 0.1 mg twice a day  Hypercholesterolemia: Lipids  reviewed from 05/27/19. Tchol 279, HDL 118, LDL 145, TG 78 We discussed guidelines for age. After shared decision making, will not pursue statin  Cardiac risk counseling and prevention recommendations: -recommend heart healthy/Mediterranean diet, with whole grains, fruits, vegetable, fish, lean meats, nuts, and olive oil. Limit salt. -recommend moderate walking, 3-5 times/week for 30-50 minutes each session. Aim for at least 150 minutes.week. Goal should be pace of 3 miles/hours, or walking 1.5 miles in 30 minutes -recommend avoidance of tobacco products. Avoid excess alcohol. -Additional risk factor control:  -Diabetes risk: A1c is  -Weight: BMI 20. Weight loss from trigeminal neuralgia -ASCVD risk score: The ASCVD Risk score Mikey Bussing DC Jr., et al., 2013) failed to calculate for the following reasons:   The 2013 ASCVD risk score is only valid for ages 74 to 85    Plan for follow up: 3 mos  [name], MD, PhD [place]  [facility]    Medication Adjustments/Labs and Tests Ordered: Current medicines are reviewed at length with the patient today.  Concerns regarding medicines are outlined above.  Orders Placed This Encounter  Procedures  . EKG 12-Lead  . ECHOCARDIOGRAM COMPLETE   No orders of the defined types were placed in this encounter.   Patient Instructions  Medication Instructions:  Your Physician recommend you continue on your current medication as directed.    *If you need a refill on your cardiac medications before your next appointment, please call your pharmacy*   Lab Work: None   Testing/Procedures: Your physician has requested that you have an echocardiogram. Echocardiography is a painless test that uses sound waves to create images of your heart. It provides your doctor with information about the size and shape of your heart and how well your heart's chambers and valves are working. This procedure takes approximately one hour. There are no  restrictions for this procedure. [address]  [address]    Follow-Up: At [facility], you and your health needs are our priority.  As part of our continuing mission to provide you with exceptional heart care, we have created designated Provider Care Teams.  These Care Teams include your primary Cardiologist (physician) and Advanced Practice Providers (APPs -  Physician Assistants and Nurse Practitioners) who all work together to provide you with the care you need, when you need it.  We recommend signing up for the patient portal called "MyChart".  Sign up information is provided on this After Visit Summary.  MyChart is used to connect with patients for Virtual Visits (Telemedicine).  Patients are able to view lab/test results, encounter notes, upcoming appointments, etc.  Non-urgent messages can be sent to your provider as well.   To learn more about what you can do with MyChart, go to [email or web address].    Your next appointment:   3 month(s)  The format for your next appointment:   In Person  Provider:   [name], MD   Other Instructions -counseled on how to check blood pressure:  -sit comfortably in a chair, feet uncrossed and flat on floor, for 5-10 minutes  -arm ideally should rest at the level of the heart. However, arm should be relaxed and not tense (for example, do not hold the arm up unsupported)  -avoid exercise, caffeine, and tobacco for at least 30 minutes prior to BP reading  -don't take BP cuff reading over clothes (always place on skin directly)  -I prefer to know how well the medication is working, so I would like you to take your readings 1-2 hours after taking your blood pressure medication if possible. Mid day numbers are likely best for you.    Signed, [name], MD PhD 12/04/2019   [place]

## 2019-12-08 ENCOUNTER — Ambulatory Visit
Admission: RE | Admit: 2019-12-08 | Discharge: 2019-12-08 | Disposition: A | Discharge status: Home / Self Care | Payer: Medicare Other | Source: Ambulatory Visit | Attending: Neurosurgery | Admitting: Neurosurgery

## 2019-12-08 DIAGNOSIS — M5416 Radiculopathy, lumbar region: Secondary | ICD-10-CM

## 2019-12-08 DIAGNOSIS — M48061 Spinal stenosis, lumbar region without neurogenic claudication: Secondary | ICD-10-CM | POA: Diagnosis not present

## 2019-12-08 DIAGNOSIS — M5126 Other intervertebral disc displacement, lumbar region: Secondary | ICD-10-CM | POA: Diagnosis not present

## 2019-12-08 MED ORDER — GADOBENATE DIMEGLUMINE 529 MG/ML IV SOLN
11.0000 mL | Freq: Once | INTRAVENOUS | Status: AC | PRN
  Administered 2019-12-08: 11 mL via INTRAVENOUS

## 2019-12-09 DIAGNOSIS — R296 Repeated falls: Secondary | ICD-10-CM | POA: Diagnosis not present

## 2019-12-09 DIAGNOSIS — M25512 Pain in left shoulder: Secondary | ICD-10-CM | POA: Diagnosis not present

## 2019-12-09 DIAGNOSIS — M25511 Pain in right shoulder: Secondary | ICD-10-CM | POA: Diagnosis not present

## 2019-12-09 DIAGNOSIS — Z9181 History of falling: Secondary | ICD-10-CM | POA: Diagnosis not present

## 2019-12-11 DIAGNOSIS — M25512 Pain in left shoulder: Secondary | ICD-10-CM | POA: Diagnosis not present

## 2019-12-11 DIAGNOSIS — Z9181 History of falling: Secondary | ICD-10-CM | POA: Diagnosis not present

## 2019-12-11 DIAGNOSIS — R296 Repeated falls: Secondary | ICD-10-CM | POA: Diagnosis not present

## 2019-12-11 DIAGNOSIS — M25511 Pain in right shoulder: Secondary | ICD-10-CM | POA: Diagnosis not present

## 2019-12-16 ENCOUNTER — Ambulatory Visit: Payer: Medicare Other | Admitting: Internal Medicine

## 2019-12-16 ENCOUNTER — Other Ambulatory Visit: Payer: Self-pay

## 2019-12-16 ENCOUNTER — Encounter: Payer: Self-pay | Admitting: Internal Medicine

## 2019-12-16 VITALS — BP 142/88 | HR 55 | Temp 97.7°F | Ht 66.0 in | Wt 128.0 lb

## 2019-12-16 DIAGNOSIS — R0989 Other specified symptoms and signs involving the circulatory and respiratory systems: Secondary | ICD-10-CM | POA: Diagnosis not present

## 2019-12-16 DIAGNOSIS — Z79899 Other long term (current) drug therapy: Secondary | ICD-10-CM

## 2019-12-16 DIAGNOSIS — G500 Trigeminal neuralgia: Secondary | ICD-10-CM

## 2019-12-16 DIAGNOSIS — G43109 Migraine with aura, not intractable, without status migrainosus: Secondary | ICD-10-CM | POA: Diagnosis not present

## 2019-12-16 MED ORDER — NURTEC 75 MG PO TBDP: 1.0000 | ORAL_TABLET | ORAL | 0 refills | Status: DC | PRN

## 2019-12-16 NOTE — Patient Instructions (Addendum)
Can try  nurtec odt  With headache  No more than one a day and tested as mo more than 15 in a month . Try the sample and if you think helpful then we can do a prescription   No change in BP meds at this time    Rimegepant oral dissolving tablet What is this medicine? RIMEGEPANT (ri ME je pant) is used to treat migraine headaches with or without aura. An aura is a strange feeling or visual disturbance that warns you of an attack. It is not used to prevent migraines. This medicine may be used for other purposes; ask your health care provider or pharmacist if you have questions. COMMON BRAND NAME(S): NURTEC ODT What should I tell my health care provider before I take this medicine? They need to know if you have any of these conditions:  kidney disease  liver disease  an unusual or allergic reaction to rimegepant, other medicines, foods, dyes, or preservatives  pregnant or trying to get pregnant  breast-feeding How should I use this medicine? Take the medicine by mouth. Follow the directions on the prescription label. Leave the tablet in the sealed blister pack until you are ready to take it. With dry hands, open the blister and gently remove the tablet. If the tablet breaks or crumbles, throw it away and take a new tablet out of the blister pack. Place the tablet in the mouth and allow it to dissolve, and then swallow. Do not cut, crush, or chew this medicine. You do not need water to take this medicine. Talk to your pediatrician about the use of this medicine in children. Special care may be needed. Overdosage: If you think you have taken too much of this medicine contact a poison control center or emergency room at once. NOTE: This medicine is only for you. Do not share this medicine with others. What if I miss a dose? This does not apply. This medicine is not for regular use. What may interact with this medicine? This medicine may interact with the following medications:  certain  medicines for fungal infections like fluconazole, itraconazole  rifampin This list may not describe all possible interactions. Give your health care provider a list of all the medicines, herbs, non-prescription drugs, or dietary supplements you use. Also tell them if you smoke, drink alcohol, or use illegal drugs. Some items may interact with your medicine. What should I watch for while using this medicine? Visit your health care professional for regular checks on your progress. Tell your health care professional if your symptoms do not start to get better or if they get worse. What side effects may I notice from receiving this medicine? Side effects that you should report to your doctor or health care professional as soon as possible:  allergic reactions like skin rash, itching or hives; swelling of the face, lips, or tongue Side effects that usually do not require medical attention (report these to your doctor or health care professional if they continue or are bothersome):  nausea This list may not describe all possible side effects. Call your doctor for medical advice about side effects. You may report side effects to FDA at 1-800-FDA-1088. Where should I keep my medicine? Keep out of the reach of children. Store at room temperature between 15 and 30 degrees C (59 and 86 degrees F). Throw away any unused medicine after the expiration date. NOTE: This sheet is a summary. It may not cover all possible information. If you have questions  about this medicine, talk to your doctor, pharmacist, or health care provider.  2020 Elsevier/Gold Standard (2018-07-23 00:21:31)

## 2019-12-16 NOTE — Progress Notes (Signed)
Chief Complaint  Patient presents with  . Follow-up    Still having swelling in both feet  . Migraine    Has had all spring into summer  . Shoulder Pain    left shoulder    HPI: [name] 79 y.o. come in for multiple medical issues  But mostly BP contrl and MHA meds  management     BP  mostly below 140 or so ocass 177 presents with readings  Some 120  No syncope now on clonidine.1 bid  Carvedilol and  lotension    With lasix daily feet still puffy   Seeing cards  Dr Harrell Gave and  Planning echo  In near future but bp is now acceptable  HAs  More frequent migraines  Take otc Excedrin migraine and recently asked for rescue. Describes her MHA tingkin one side of face  Pain and back throbbing and some times arms tingling and vision change?  Usually last less than a day if take rescue and rests . Recently more and lasted 2 days. 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  Past Medical History:  Diagnosis Date  . Abdominal pain 05/29/2013   s/p rx of cephalo resistant e coli   but last rx NG  now residular ?  bladder sx repeat cx sx rx to ty and uro consult   . ADJ DISORDER WITH MIXED ANXIETY & DEPRESSED MOOD 03/03/2010   Qualifier: Diagnosis of  By: [name], [name]   . Closed head injury 02/01/2011   from syncope and had scalp laceration  neg ct .    Marland Kitchen Closed head injury 5-6 yrs ago  . Colitis 11/27/2017  . Complication of anesthesia    migraine several hours after general anesthesia  . Fatty liver   . Gall stones 2016   see ct scan neg HIDA   . GERD (gastroesophageal reflux disease)   . Hearing aid worn   . HOH (hard of hearing)    both ears  . Hyperlipidemia   . Hypertension    echo nl lv function  mild dilitation 2009  . Kidney infection    few yrs ago in hospital  . Medication side effect 09/02/2010   Poss  muscle se of 10 crestor   . Migraine    hypnic HA eval by Dr. Earley Favor in the past  . Polycythemia   . 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 Pyelonephritis 03/12/2015  . 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 Syncope 02/01/2011   In shower on vacation  sustained head laceration  8 sutures Had ed visit neg head ct labs and x ray   . Trigeminal neuralgia pain     Family History  Problem Relation Age of Onset  . Ovarian cancer Mother   . Stroke Mother   . Alcohol abuse Father   . Stroke Father   . Diabetes Brother   . Cancer Paternal Aunt        leukemia, unknown type  . Seizures Daughter   . Hypertension Other   . Colon cancer Neg Hx     Social History   Socioeconomic History  . Marital status: Married    Spouse name: Not on file  . Number of children: 2  .  Years of education: Not on file  . Highest education level: Not on file  Occupational History    Comment: retired Forensic psychologist  Tobacco Use  . Smoking status: Never Smoker  . Smokeless tobacco: Never Used  Vaping Use  . Vaping Use: Never used  Substance and Sexual Activity  . Alcohol use: Yes    Alcohol/week: 2.0 standard drinks    Types: 2 Glasses of wine per week    Comment: occ wine  . Drug use: No  . Sexual activity: Not on file  Other Topics Concern  . Not on file  Social History Narrative   Married   HH of 2-3 (god daughter)   Pets 2 dogs   Non smoker    Child is a physician   G2P2      Caffeine: 2 cups/day   Social Determinants of Health   Financial Resource Strain:   . Difficulty of Paying Living Expenses:   Food Insecurity:   . Worried About Charity fundraiser in the Last Year:   . Arboriculturist in the Last Year:   Transportation Needs:   . Film/video editor (Medical):   Marland Kitchen Lack of Transportation (Non-Medical):   Physical Activity:   . Days of Exercise per Week:   . Minutes of Exercise per Session:   Stress:   . Feeling of Stress :   Social  Connections:   . Frequency of Communication with Friends and Family:   . Frequency of Social Gatherings with Friends and Family:   . Attends Religious Services:   . Active Member of Clubs or Organizations:   . Attends Archivist Meetings:   Marland Kitchen Marital Status:     Outpatient Medications Prior to Visit  Medication Sig Dispense Refill  . Acetaminophen (TYLENOL PO) Take 2 tablets by mouth as needed.    Marland Kitchen albuterol (PROVENTIL HFA;VENTOLIN HFA) 108 (90 Base) MCG/ACT inhaler Inhale 2 puffs into the lungs every 6 (six) hours as needed. 1 Inhaler 2  . alendronate (FOSAMAX) 70 MG tablet Take 1 tablet (70 mg total) by mouth every 7 (seven) days. Take with a full glass of water on an empty stomach. 4 tablet 11  . benazepril (LOTENSIN) 20 MG tablet Take 1 tablet (20 mg total) by mouth daily. 90 tablet 1  . budesonide-formoterol (SYMBICORT) 160-4.5 MCG/ACT inhaler Inhale 2 puffs into the lungs 2 (two) times daily. 1 Inhaler 2  . butalbital-aspirin-caffeine-codeine (ASCOMP-CODEINE) 50-325-40-30 MG capsule TAKE 1-2 CAPSULES BY MOUTH EVERY FOUR TO SIX HOURS AS NEEDED FOR MIGRAINE RESCUE,MAX OF 6 CAPSULES IN 24 HOURS 15 capsule 0  . carvedilol (COREG) 25 MG tablet Take 1 tablet (25 mg total) by mouth 2 (two) times daily with a meal. 60 tablet 3  . cloNIDine (CATAPRES) 0.1 MG tablet TAKE 1 TABLET(0.1 MG) BY MOUTH TWICE DAILY 60 tablet 0  . conjugated estrogens (PREMARIN) vaginal cream Place 1 Applicatorful vaginally 2 (two) times a week.    . fluticasone (FLONASE) 50 MCG/ACT nasal spray Place 2 sprays into both nostrils as needed for allergies or rhinitis.     . furosemide (LASIX) 20 MG tablet TAKE 1 TABLET(20 MG) BY MOUTH DAILY 30 tablet 1  . lamoTRIgine (LAMICTAL) 25 MG tablet TAKE 2 TABLETS(50 MG) BY MOUTH TWICE DAILY (Patient taking differently: Take 25 mg by mouth 2 (two) times daily. ) 120 tablet 0  . NUCYNTA 50 MG tablet Take 50 mg by mouth every 4 (four) hours as needed.    Marland Kitchen  omeprazole  (PRILOSEC) 20 MG capsule Take 20 mg by mouth daily.    . ondansetron (ZOFRAN-ODT) 4 MG disintegrating tablet Take 1-2 tablets (4-8 mg total) by mouth every 8 (eight) hours as needed for nausea. 60 tablet 3  . OVER THE COUNTER MEDICATION OTC eye drops     No facility-administered medications prior to visit.     EXAM:  BP (!) 142/88   Pulse 55   Temp 97.7 F (36.5 C) (Oral)   Ht 5' 6"  (1.676 m)   Wt 128 lb (58.1 kg)   SpO2 96%   BMI 20.66 kg/m   Body mass index is 20.66 kg/m. Wt Readings from Last 3 Encounters:  12/16/19 128 lb (58.1 kg)  12/04/19 128 lb 6.4 oz (58.2 kg)  11/07/19 128 lb 0.5 oz (58.1 kg)    GENERAL: vitals reviewed and listed above, alert, oriented, appears well hydrated and in no acute distress HEENT: atraumatic, conjunctiva  clear, no obvious abnormalities on inspection of external nose and ears OP :masked  NECK: no obvious masses on inspection palpation  LUNGS: clear to auscultation bilaterally, no wheezes, rales or rhonchi, good air movement Abdomen:  Sof,t normal bowel sounds without hepatosplenomegaly, no guarding rebound or masses no CVA tenderness CV: HRRR, no clubbing cyanosis puffy feet  peripheral edema nl cap refill  MS: moves all extremities without noticeable focal  Abnormality djd  Dec rom shoulder  PSYCH: pleasant and cooperative, no obvious depression or anxiety Gait seems steady  Lab Results  Component Value Date   WBC 5.1 11/07/2019   HGB 14.5 11/07/2019   HCT 45.5 11/07/2019   PLT 167 11/07/2019   GLUCOSE 101 (H) 11/07/2019   CHOL 279 (H) 05/27/2019   TRIG 78.0 05/27/2019   HDL 118.80 05/27/2019   LDLDIRECT 135.9 10/16/2012   LDLCALC 145 (H) 05/27/2019   ALT 18 08/29/2019   AST 25 08/29/2019   NA 142 11/07/2019   K 3.8 11/07/2019   CL 106 11/07/2019   CREATININE 0.90 11/07/2019   BUN 19 11/07/2019   CO2 27 11/07/2019   TSH 0.54 05/27/2019   INR 1.23 03/12/2015   HGBA1C 5.5 05/25/2018   BP Readings from Last 3 Encounters:    12/16/19 (!) 142/88  12/04/19 115/78  11/07/19 (!) 202/91  bp log reviewed   ASSESSMENT AND PLAN:  Discussed the following assessment and plan:  Migraine with aura and without status migrainosus, not intractable - vision change and tingling   Medication management  Labile hypertension  Trigeminal neuralgia Given headache calendar or other to monitor ha and meds used   Caution  About rebound and sleep with caffeine. prefer non narcotic rescue   Sample 2# of Nurtec  And let us know about response  Migraines have neuro sx with this tia like  ( apparently not a change )    Will keep dr Jaynee Eagles informed  And am open to her  Input into  Management as indicated  Plan calendar and rov in 3-4 months   Same bp med for now   Hyponatremia is resolved  -Patient advised to return or notify health care team  if  new concerns arise.  Patient Instructions  Can try  nurtec odt  With headache  No more than one a day and tested as mo more than 15 in a month . Try the sample and if you think helpful then we can do a prescription   No change in BP meds at this time  Rimegepant oral dissolving tablet What is this medicine? RIMEGEPANT (ri ME je pant) is used to treat migraine headaches with or without aura. An aura is a strange feeling or visual disturbance that warns you of an attack. It is not used to prevent migraines. This medicine may be used for other purposes; ask your health care provider or pharmacist if you have questions. COMMON BRAND NAME(S): NURTEC ODT What should I tell my health care provider before I take this medicine? They need to know if you have any of these conditions:  kidney disease  liver disease  an unusual or allergic reaction to rimegepant, other medicines, foods, dyes, or preservatives  pregnant or trying to get pregnant  breast-feeding How should I use this medicine? Take the medicine by mouth. Follow the directions on the prescription label. Leave the tablet  in the sealed blister pack until you are ready to take it. With dry hands, open the blister and gently remove the tablet. If the tablet breaks or crumbles, throw it away and take a new tablet out of the blister pack. Place the tablet in the mouth and allow it to dissolve, and then swallow. Do not cut, crush, or chew this medicine. You do not need water to take this medicine. Talk to your pediatrician about the use of this medicine in children. Special care may be needed. Overdosage: If you think you have taken too much of this medicine contact a poison control center or emergency room at once. NOTE: This medicine is only for you. Do not share this medicine with others. What if I miss a dose? This does not apply. This medicine is not for regular use. What may interact with this medicine? This medicine may interact with the following medications:  certain medicines for fungal infections like fluconazole, itraconazole  rifampin This list may not describe all possible interactions. Give your health care provider a list of all the medicines, herbs, non-prescription drugs, or dietary supplements you use. Also tell them if you smoke, drink alcohol, or use illegal drugs. Some items may interact with your medicine. What should I watch for while using this medicine? Visit your health care professional for regular checks on your progress. Tell your health care professional if your symptoms do not start to get better or if they get worse. What side effects may I notice from receiving this medicine? Side effects that you should report to your doctor or health care professional as soon as possible:  allergic reactions like skin rash, itching or hives; swelling of the face, lips, or tongue Side effects that usually do not require medical attention (report these to your doctor or health care professional if they continue or are bothersome):  nausea This list may not describe all possible side effects. Call  your doctor for medical advice about side effects. You may report side effects to FDA at 1-800-FDA-1088. Where should I keep my medicine? Keep out of the reach of children. Store at room temperature between 15 and 30 degrees C (59 and 86 degrees F). Throw away any unused medicine after the expiration date. NOTE: This sheet is a summary. It may not cover all possible information. If you have questions about this medicine, talk to your doctor, pharmacist, or health care provider.  2020 Elsevier/Gold Standard (2018-07-23 00:21:31)     [name]. Harvis Mabus M.D.

## 2019-12-18 ENCOUNTER — Ambulatory Visit (HOSPITAL_COMMUNITY): Payer: Medicare Other | Attending: Cardiology

## 2019-12-18 ENCOUNTER — Other Ambulatory Visit: Payer: Self-pay

## 2019-12-18 DIAGNOSIS — R600 Localized edema: Secondary | ICD-10-CM

## 2019-12-18 DIAGNOSIS — M25511 Pain in right shoulder: Secondary | ICD-10-CM | POA: Diagnosis not present

## 2019-12-18 DIAGNOSIS — R296 Repeated falls: Secondary | ICD-10-CM | POA: Diagnosis not present

## 2019-12-18 DIAGNOSIS — Z9181 History of falling: Secondary | ICD-10-CM | POA: Diagnosis not present

## 2019-12-18 DIAGNOSIS — M25512 Pain in left shoulder: Secondary | ICD-10-CM | POA: Diagnosis not present

## 2019-12-18 LAB — ECHOCARDIOGRAM COMPLETE
Area-P 1/2: 3.42 cm2
S' Lateral: 2.3 cm

## 2019-12-19 ENCOUNTER — Ambulatory Visit: Payer: Medicare Other | Admitting: Physician Assistant

## 2019-12-19 ENCOUNTER — Telehealth: Payer: Self-pay | Admitting: Radiology

## 2019-12-19 ENCOUNTER — Ambulatory Visit: Payer: Self-pay

## 2019-12-19 ENCOUNTER — Encounter: Payer: Self-pay | Admitting: Physician Assistant

## 2019-12-19 DIAGNOSIS — G8929 Other chronic pain: Secondary | ICD-10-CM | POA: Diagnosis not present

## 2019-12-19 DIAGNOSIS — M25512 Pain in left shoulder: Secondary | ICD-10-CM | POA: Diagnosis not present

## 2019-12-19 NOTE — Progress Notes (Signed)
Office Visit Note   Patient: [name]           Date of Birth: [birth year]           MRN: [id number] Visit Date: 12/19/2019              Requested by: [name], MD [place],  [place] [id number] PCP: [name], MD   Assessment & Plan: Visit Diagnoses:  1. Chronic left shoulder pain     Plan: Patient has failed conservative treatment which is included time, subacromial injections x2 physical therapy both formal therapy and home exercise program for her left shoulder pain continues to have severe pain that is affecting her range of motion and daily activities.  Therefore recommend MRI of the left shoulder rule out internal derangement as a source of her pain.  Have her follow-up after the MRI to go over results and discuss further treatment.  Questions were encouraged and answered at length.  Follow-Up Instructions: Return Follow up after MRI.   Orders:  Orders Placed This Encounter  Procedures   XR Shoulder Left   No orders of the defined types were placed in this encounter.     Procedures: No procedures performed   Clinical Data: No additional findings.   Subjective: Chief Complaint  Patient presents with   Left Shoulder - Pain    HPI [name] returns today due to her left shoulder pain.  States she has very achy pain.  She notes her range of motion is decreasing since she was seen 1 month ago.  She continues physical therapy.  However she has had to cut back on her exercises due to pain in the shoulder.  She is having difficulty sleeping due to the pain.  She has pain with overhead activity and reaching behind her.  Notes some popping in the shoulder at times.  Radiographs of her shoulder showed no significant arthritic changes. Review of Systems See HPI otherwise negative  Objective: Vital Signs: There were no vitals taken for this visit.  Physical Exam General: Well-developed well-nourished female no acute  distress. Psych: Alert and orient x3. Ortho Exam Left shoulder weakness with an empty can test.  Five and a five strength with external/internal rotation bilateral shoulders.  Positive impingement testing on the left only.  Right shoulder full range of motion without pain.  Left shoulder actively she can forward flex the right elbow 160 degrees passively I can bring her to 170 degrees.  Unable to perform liftoff test due to pain.  Specialty Comments:  No specialty comments available.  Imaging: No results found.   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   Agent resistant to multiple antibiotics 05/29/2013   Low back pain radiating to right leg 01/16/2013   Sinus problem 11/20/2012   Chronic headaches morning 10/19/2012   Low sodium levels  133 10/19/2012   Leg pain, posterior 08/16/2012   Sciatic neuritis 08/16/2012   Sleep disturbance, unspecified 08/16/2012   Medication management 03/24/2012   Preventative  health care 09/24/2011   Trigeminal neuralgia 09/24/2011   Postmenopausal HRT (hormone replacement therapy) 09/24/2011   Polycythemia 09/20/2011   Morning headache 02/01/2011   Hearing aid worn    Abnormal blood finding    LOCALIZED SUPERFICIAL SWELLING MASS OR LUMP 09/21/2009   DYSPNEA 05/07/2008   Hypnic headache 03/19/2008   Cholelithiasis with chronic cholecystitis 03/19/2008   Headache(784.0) 06/05/2007   JAW PAIN 04/06/2007    OSTEOARTHRITIS 04/06/2007   ABNORMAL RESULT, FUNCTION STUDY, [place] 02/05/2007   HYPERLIPIDEMIA 12/25/2006   HYPERTENSION 12/25/2006   Past Medical History:  Diagnosis Date   Abdominal pain 05/29/2013   s/p rx of cephalo resistant e coli   but last rx NG  now residular ?  bladder sx repeat cx sx rx to ty and uro consult    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     Closed head injury 5-6 yrs ago   Colitis 05/30/5629   Complication of anesthesia    migraine several hours after general anesthesia   Fatty liver    Gall stones 2016   see ct scan neg HIDA    GERD (gastroesophageal reflux disease)    Hearing aid worn    HOH (hard of hearing)    both ears   Hyperlipidemia    Hypertension    echo nl lv function  mild dilitation 2009   Kidney infection    few yrs ago in hospital   Medication side effect 09/02/2010   Poss muscle se of 10 crestor    Migraine    hypnic HA eval by Dr. 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    Pyelonephritis 03/12/2015   Sensation of pain in anesthetized distribution of trigeminal nerve    Syncope 02/01/2011   In shower on vacation  sustained head laceration  8 sutures Had ed visit neg head ct labs and x ray    Trigeminal neuralgia pain     Family History  Problem Relation Age of Onset   Ovarian cancer Mother    Stroke Mother    Alcohol abuse Father    Stroke Father    Diabetes Brother    Cancer Paternal Aunt        leukemia, unknown type   Seizures Daughter    Hypertension Other    Colon cancer Neg Hx     Past Surgical History:  Procedure Laterality Date   ABDOMINAL HYSTERECTOMY  2002   tubal   CARDIAC CATHETERIZATION  2000   chest pains neg   CHOLECYSTECTOMY N/A 02/21/2017   Procedure: LAPAROSCOPIC CHOLECYSTECTOMY WITH  INTRAOPERATIVE CHOLANGIOGRAM;  Surgeon: Armandina Gemma, MD;  Location: [facility];  Service: General;  Laterality: N/A;   COLONOSCOPY     multiple   CRANIOTOMY  12/09/2011   nerve decompression right trigeminal    DOPPLER ECHOCARDIOGRAPHY  2009   nl lv function mild lv dilitation   EYE SURGERY Bilateral    ioc for catatracts   laparoscopic gallbladder surgery  02/16/2017   Fax from [place] Bilateral 2002   rt shoulder surgery     Social History   Occupational History    Comment: retired Forensic psychologist  Tobacco Use   Smoking status: Never Smoker   Smokeless  tobacco: Never Used  Vaping Use   Vaping Use: Never used  Substance and Sexual Activity   Alcohol use: Yes    Alcohol/week: 2.0 standard drinks    Types: 2 Glasses of wine per week    Comment: occ wine   Drug use: No   Sexual activity: Not on file

## 2019-12-19 NOTE — Telephone Encounter (Signed)
No charge for x-ray today 12/19/2019

## 2019-12-20 ENCOUNTER — Other Ambulatory Visit: Payer: Self-pay | Admitting: Radiology

## 2019-12-20 DIAGNOSIS — G8929 Other chronic pain: Secondary | ICD-10-CM

## 2019-12-20 DIAGNOSIS — M25512 Pain in left shoulder: Secondary | ICD-10-CM

## 2019-12-20 NOTE — Telephone Encounter (Signed)
Can you please make sure patient is not charge for xray taken on OV with Artis Delay on 12/19/19 per his request?

## 2019-12-24 DIAGNOSIS — M25512 Pain in left shoulder: Secondary | ICD-10-CM | POA: Diagnosis not present

## 2019-12-24 DIAGNOSIS — Z9181 History of falling: Secondary | ICD-10-CM | POA: Diagnosis not present

## 2019-12-24 DIAGNOSIS — M25511 Pain in right shoulder: Secondary | ICD-10-CM | POA: Diagnosis not present

## 2019-12-24 DIAGNOSIS — R296 Repeated falls: Secondary | ICD-10-CM | POA: Diagnosis not present

## 2019-12-25 ENCOUNTER — Other Ambulatory Visit: Payer: Medicare Other

## 2019-12-26 ENCOUNTER — Other Ambulatory Visit: Payer: Self-pay | Admitting: Neurology

## 2019-12-26 DIAGNOSIS — M25511 Pain in right shoulder: Secondary | ICD-10-CM | POA: Diagnosis not present

## 2019-12-26 DIAGNOSIS — M25512 Pain in left shoulder: Secondary | ICD-10-CM | POA: Diagnosis not present

## 2019-12-26 DIAGNOSIS — R296 Repeated falls: Secondary | ICD-10-CM | POA: Diagnosis not present

## 2019-12-26 DIAGNOSIS — Z9181 History of falling: Secondary | ICD-10-CM | POA: Diagnosis not present

## 2019-12-30 DIAGNOSIS — M5416 Radiculopathy, lumbar region: Secondary | ICD-10-CM | POA: Diagnosis not present

## 2019-12-30 DIAGNOSIS — M5126 Other intervertebral disc displacement, lumbar region: Secondary | ICD-10-CM | POA: Diagnosis not present

## 2019-12-31 ENCOUNTER — Telehealth: Payer: Self-pay | Admitting: Cardiology

## 2019-12-31 NOTE — Telephone Encounter (Signed)
Patient called in to receive echo results. Patient notified of the following from   [name], MD  12/19/2019 2:15 PM EDT     The squeeze of the heart is normal, but the heart doesn't relax perfectly (it's a little stiff). This is very common. The treatment is to make sure blood pressure stays well controlled and watch for fluid buildup. This may be part of why the legs swell on occasion.   Patient notified of results verbalized understanding.

## 2019-12-31 NOTE — Telephone Encounter (Signed)
Pt is calling to get the results of her echocardiogram. Please call to discuss

## 2020-01-02 ENCOUNTER — Other Ambulatory Visit: Payer: Self-pay | Admitting: Internal Medicine

## 2020-01-02 DIAGNOSIS — Z9181 History of falling: Secondary | ICD-10-CM | POA: Diagnosis not present

## 2020-01-02 DIAGNOSIS — M25511 Pain in right shoulder: Secondary | ICD-10-CM | POA: Diagnosis not present

## 2020-01-02 DIAGNOSIS — M25512 Pain in left shoulder: Secondary | ICD-10-CM | POA: Diagnosis not present

## 2020-01-02 DIAGNOSIS — R296 Repeated falls: Secondary | ICD-10-CM | POA: Diagnosis not present

## 2020-01-11 ENCOUNTER — Telehealth: Payer: Self-pay | Admitting: Family Medicine

## 2020-01-11 NOTE — Telephone Encounter (Signed)
On-call provider contacted members to patient calling in requesting medication for nausea.  Patient states she was on tramadol however it was causing nausea.  States medication was switched to hydrocodone which also caused nausea.  Patient states she has tolerated the medication in the past and would like something for nausea.  Per chart review no records of recent tramadol or hydrocodone prescriptions seen.  PDMP reviewed and recent Rx from 8/18 for tramadol noted.  Per medication list patient given prescription for Zofran ODT 4 mg with 3 refills written in April.  Patient to check with pharmacy to see if she has refills.  If not, okay given for limited supply of Zofran 4 mg ODT take 1 tab every 8 as needed, no refills.  Grier Mitts, MD

## 2020-01-19 ENCOUNTER — Ambulatory Visit
Admission: RE | Admit: 2020-01-19 | Discharge: 2020-01-19 | Disposition: A | Discharge status: Home / Self Care | Payer: Medicare Other | Source: Ambulatory Visit | Attending: Physician Assistant | Admitting: Physician Assistant

## 2020-01-19 DIAGNOSIS — G8929 Other chronic pain: Secondary | ICD-10-CM

## 2020-01-19 DIAGNOSIS — M75122 Complete rotator cuff tear or rupture of left shoulder, not specified as traumatic: Secondary | ICD-10-CM | POA: Diagnosis not present

## 2020-01-19 DIAGNOSIS — M7552 Bursitis of left shoulder: Secondary | ICD-10-CM | POA: Diagnosis not present

## 2020-01-19 DIAGNOSIS — R600 Localized edema: Secondary | ICD-10-CM | POA: Diagnosis not present

## 2020-01-19 DIAGNOSIS — M19012 Primary osteoarthritis, left shoulder: Secondary | ICD-10-CM | POA: Diagnosis not present

## 2020-01-26 ENCOUNTER — Other Ambulatory Visit: Payer: Self-pay | Admitting: Internal Medicine

## 2020-01-26 ENCOUNTER — Other Ambulatory Visit: Payer: Self-pay | Admitting: Neurology

## 2020-01-29 ENCOUNTER — Ambulatory Visit (INDEPENDENT_AMBULATORY_CARE_PROVIDER_SITE_OTHER): Payer: Medicare Other | Admitting: Physician Assistant

## 2020-01-29 ENCOUNTER — Other Ambulatory Visit: Payer: Self-pay

## 2020-01-29 ENCOUNTER — Telehealth: Payer: Self-pay | Admitting: Internal Medicine

## 2020-01-29 ENCOUNTER — Encounter: Payer: Self-pay | Admitting: Physician Assistant

## 2020-01-29 DIAGNOSIS — M7542 Impingement syndrome of left shoulder: Secondary | ICD-10-CM | POA: Diagnosis not present

## 2020-01-29 NOTE — Progress Notes (Signed)
HPI: [name] returns today to go over the MRI of her left shoulder.  She is stiff pain in the shoulder.  She tried physical therapy injections without significant relief.  MRI of the left shoulder is reviewed with the patient.  It showed a rotator cuff tear involving the supra spinatus with retraction of 3.5 to 4.5 cm.  Mild fatty atrophy of the supraspinatus.  Tendinopathy involving the rotator cuff and the long head of the biceps.  Mild degenerative changes seen in the glenohumeral joint.  Type II acromion. Actual images are reviewed with the patient today.  Impression: Left shoulder impingement Rotator cuff tear  Plan: Given patient's failure of conservative treatment recommend left shoulder arthroscopy with extensive arthroscopic debridement and subacromial decompression.  Would not recommend rotator cuff repair given patient's age and the likelihood that there is if repairable would fail.  Questions were encouraged and answered at length with the patient.  We'll proceed with surgery in the near future however patient does have a surgical procedure coming up with spine surgery would recommend waiting approximately 2 months before proceeding with the shoulder surgery.

## 2020-01-29 NOTE — Progress Notes (Signed)
  Chronic Care Management   Outreach Note  01/29/2020 Name: [name] MRN: [id number] DOB: April 22, 1941  Referred by: [name], MD Reason for referral : No chief complaint on file.   An unsuccessful telephone outreach was attempted today. The patient was referred to the pharmacist for assistance with care management and care coordination.   Follow Up Plan:   Carley Perdue [employer] Scheduler

## 2020-02-02 ENCOUNTER — Other Ambulatory Visit: Payer: Self-pay | Admitting: Internal Medicine

## 2020-02-05 ENCOUNTER — Telehealth: Payer: Self-pay | Admitting: Internal Medicine

## 2020-02-05 NOTE — Progress Notes (Signed)
  Chronic Care Management   Note  02/05/2020 Name: Edithe Dobbin MRN: [id number] DOB: Jan 19, 1941  Tonye Tancredi is a 79 y.o. year old female who is a primary care patient of [name], [name], MD. I reached out to [name] by phone today in response to a referral sent by Ms. Gertie Baron Kakar's PCP, [name], [name], MD.   Ms. Luscher was given information about Chronic Care Management services today including:  1. CCM service includes personalized support from designated clinical staff supervised by her physician, including individualized plan of care and coordination with other care providers 2. 24/7 contact phone numbers for assistance for urgent and routine care needs. 3. Service will only be billed when office clinical staff spend 20 minutes or more in a month to coordinate care. 4. Only one practitioner may furnish and bill the service in a calendar month. 5. The patient may stop CCM services at any time (effective at the end of the month) by phone call to the office staff.   Patient agreed to services and verbal consent obtained.   Follow up plan:   Carley Perdue [employer] Scheduler

## 2020-02-06 MED ORDER — BUTALBITAL-ASA-CAFF-CODEINE 50-325-40-30 MG PO CAPS
ORAL_CAPSULE | ORAL | 0 refills | Status: DC
Start: 2020-02-06 — End: 2020-03-05

## 2020-02-06 NOTE — Telephone Encounter (Signed)
Sorry the nurtec didn't work . Will refill med as rescue   But not comfortable with  This medication long term  .  Remember  care to not mix with  Any pain meds  Hope  the weekend  Goes well .

## 2020-02-07 ENCOUNTER — Encounter: Payer: Self-pay | Admitting: Cardiology

## 2020-02-07 NOTE — Telephone Encounter (Signed)
So the medication contains  2 controlled substances and opiate and a sedative  . Most updated  guidelines for headaches avoid these meds    Because of this  Potential adverse side effects and  Avoid rebound . And is a "high risk" med for over 65 crowd  And not to mix with other pain or sedative medications  I am aware you are cautious with [place] of this med .  But usually have  HA specialist  Involved if this  Group of meds is necessary .   Anyway we can talk later    Have a good weekend

## 2020-02-10 ENCOUNTER — Telehealth: Payer: Self-pay | Admitting: Internal Medicine

## 2020-02-10 NOTE — Telephone Encounter (Signed)
Last time this was filled by you was in 2014.  OK to refill?

## 2020-02-10 NOTE — Telephone Encounter (Signed)
Pt is calling in stating that she is out of Rx fluticasone (FLONASE) 50 MCG  Pharm: [facility]

## 2020-02-11 ENCOUNTER — Other Ambulatory Visit: Payer: Self-pay

## 2020-02-11 DIAGNOSIS — M5116 Intervertebral disc disorders with radiculopathy, lumbar region: Secondary | ICD-10-CM | POA: Diagnosis not present

## 2020-02-11 DIAGNOSIS — M48061 Spinal stenosis, lumbar region without neurogenic claudication: Secondary | ICD-10-CM | POA: Diagnosis not present

## 2020-02-11 DIAGNOSIS — M5126 Other intervertebral disc displacement, lumbar region: Secondary | ICD-10-CM | POA: Diagnosis not present

## 2020-02-11 MED ORDER — FLUTICASONE PROPIONATE 50 MCG/ACT NA SUSP: 2.0000 | NASAL | 5 refills | Status: DC | PRN

## 2020-02-11 NOTE — Telephone Encounter (Signed)
Ok to send in flonase  For a year

## 2020-02-11 NOTE — Telephone Encounter (Signed)
Medication has been sent to pharmacy requested.

## 2020-02-27 ENCOUNTER — Ambulatory Visit: Payer: Self-pay

## 2020-02-27 ENCOUNTER — Ambulatory Visit: Payer: Medicare Other | Admitting: Physician Assistant

## 2020-02-27 ENCOUNTER — Encounter: Payer: Self-pay | Admitting: Physician Assistant

## 2020-02-27 ENCOUNTER — Ambulatory Visit: Payer: Medicare Other | Admitting: Family Medicine

## 2020-02-27 DIAGNOSIS — M79604 Pain in right leg: Secondary | ICD-10-CM | POA: Diagnosis not present

## 2020-02-27 NOTE — Progress Notes (Signed)
Office Visit Note   Patient: [name]           Date of Birth: 07/04/1940           MRN: [id number] Visit Date: 02/27/2020              Requested by: [name], MD [place],  [place] [address] PCP: [name], MD   Assessment & Plan: Visit Diagnoses:  1. 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  Recommend compression stockings compression hose was applied to her leg today she will wear this during the day.  She will follow-up with [place] as needed.  Questions were encouraged and answered.  Follow-Up Instructions: Return if symptoms worsen or fail to improve.   Orders:  Orders Placed This Encounter  Procedures  . XR Tibia/Fibula Right   No orders of the defined types were placed in this encounter.     Procedures: No procedures performed   Clinical Data: No additional findings.   Subjective: Chief Complaint  Patient presents with  . Right Lower Leg - Pain    HPI [name] comes in today with right lower leg pain over the last 1 to 2 weeks.  She is recently had back surgery by Dr. Vertell Limber and states that she had pain in this leg posteriorly prior to surgery but since surgery she has had pain anterior aspect of the leg.  She notes tenderness the anterior aspect of the leg.  She has had no injury.  She also notes some discoloration in the leg is nontender to note any acute swelling she notes she had some swelling about 6 weeks ago and this was felt to be due to changes in her blood pressure medicines since changing her blood pressure medicine to another med point she has had no swelling in either leg.  She does note some tingling sensation in the plantar aspect of both feet which she did not have prior to her back surgery.  She is now having no numbness tingling down either leg otherwise.  Pain in her right  lower leg keeps her awake.  She did see Dr. Vertell Limber since surgery and was placed on Medrol Dosepak which she finished yesterday and she states this did not help with the pain in her leg.  She is using lidocaine patches over the leg. Review of Systems See HPI  Objective: Vital Signs: There were no vitals taken for this visit.  Physical Exam Constitutional:      Appearance: She is not ill-appearing or diaphoretic.  Pulmonary:     Effort: Pulmonary effort is normal.  Neurological:     Mental Status: She is alert and oriented to person, place, and time.  Psychiatric:        Mood and Affect: Mood normal.     Ortho Exam Right leg: Right knee with full range of motion without pain.  No tenderness along medial lateral joint line.  No instability valgus varus stressing.  Right calf supple nontender.  She has tenderness over the distal one fourth of the tibial shaft.  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  There is no significant swelling of either leg.  She is able to ambulate with  a cane and with a nonantalgic gait.  Specialty Comments:  No specialty comments available.  Imaging: XR Tibia/Fibula Right  Result Date: 02/27/2020 Right tib-fib/fib AP and lateral views: No acute fractures no bony abnormalities.  No bony lesions.  Knee joint and ankle joint appear well-preserved.    PMFS History: Patient Active Problem List   Diagnosis Date Noted  . 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 Radiculopathy due to lumbar intervertebral disc disorder 03/27/2019  . 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 Diarrhea 11/27/2017  . Closed head injury 11/27/2015  . Unilateral headache 05/26/2015  . ARF (acute renal failure) ([place]) 03/12/2015  . Gallstones 03/12/2015  . Nausea without vomiting 12/10/2014  . Essential hypertension  05/29/2014  . 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 Agent resistant to multiple antibiotics 05/29/2013  . Low back pain radiating to right leg 01/16/2013  . Sinus problem 11/20/2012  . Chronic headaches morning 10/19/2012  . Low sodium levels  133 10/19/2012  . Leg pain, posterior 08/16/2012  . Sciatic neuritis 08/16/2012  . Sleep disturbance, unspecified 08/16/2012  . Medication management 03/24/2012  . Preventative health care 09/24/2011  . Trigeminal neuralgia 09/24/2011  . Postmenopausal HRT (hormone replacement therapy) 09/24/2011  . Polycythemia 09/20/2011  . Morning headache 02/01/2011  . Hearing aid worn   . Abnormal blood finding   . LOCALIZED SUPERFICIAL SWELLING MASS OR LUMP 09/21/2009  . DYSPNEA 05/07/2008  . Hypnic headache 03/19/2008  . 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 Headache(784.0) 06/05/2007  . JAW PAIN 04/06/2007  . OSTEOARTHRITIS 04/06/2007  . ABNORMAL RESULT, FUNCTION STUDY, [place] 02/05/2007  . HYPERLIPIDEMIA 12/25/2006  . HYPERTENSION 12/25/2006   Past Medical History:  Diagnosis Date  . Abdominal pain 05/29/2013   s/p rx of cephalo resistant e coli   but last rx NG  now residular ?  bladder sx repeat cx sx rx to ty and uro consult   . ADJ DISORDER WITH MIXED ANXIETY & DEPRESSED MOOD 03/03/2010   Qualifier: Diagnosis of  By: [name], [name]   . Closed head injury 02/01/2011   from syncope and had scalp laceration  neg ct .    Marland Kitchen Closed head injury 5-6 yrs ago  . Colitis 11/27/2017  . Complication of anesthesia    migraine several hours after general anesthesia  . Fatty liver   . Gall stones 2016   see ct scan neg HIDA   . GERD (gastroesophageal reflux disease)   . Hearing aid worn   . HOH (hard of hearing)    both ears  . Hyperlipidemia   . Hypertension    echo nl lv function  mild dilitation 2009  . Kidney infection    few yrs ago in hospital  . Medication side effect 09/02/2010   Poss muscle se of 10 crestor   .  Migraine    hypnic HA eval by Dr. Earley Favor in the past  . Polycythemia   . 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 Pyelonephritis 03/12/2015  . 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 Syncope 02/01/2011   In shower on vacation  sustained head laceration  8 sutures Had ed visit neg head ct labs and x ray   . Trigeminal neuralgia pain     Family History  Problem Relation Age of Onset  . Ovarian cancer Mother   . Stroke Mother   . Alcohol abuse Father   .  Stroke Father   . Diabetes Brother   . Cancer Paternal Aunt        leukemia, unknown type  . Seizures Daughter   . Hypertension Other   . Colon cancer Neg Hx     Past Surgical History:  Procedure Laterality Date  . ABDOMINAL HYSTERECTOMY  2002   tubal  . CARDIAC CATHETERIZATION  2000   chest pains neg  . CHOLECYSTECTOMY N/A 02/21/2017   Procedure: LAPAROSCOPIC CHOLECYSTECTOMY WITH INTRAOPERATIVE CHOLANGIOGRAM;  Surgeon: Armandina Gemma, MD;  Location: [facility];  Service: General;  Laterality: N/A;  . COLONOSCOPY     multiple  . CRANIOTOMY  12/09/2011   nerve decompression right trigeminal   . DOPPLER ECHOCARDIOGRAPHY  2009   nl lv function mild lv dilitation  . EYE SURGERY Bilateral    ioc for catatracts  . laparoscopic gallbladder surgery  02/16/2017   Fax from [facility]  . OOPHORECTOMY Bilateral 2002  . rt shoulder surgery     Social History   Occupational History    Comment: retired Forensic psychologist  Tobacco Use  . Smoking status: Never Smoker  . Smokeless tobacco: Never Used  Vaping Use  . Vaping Use: Never used  Substance and Sexual Activity  . Alcohol use: Yes    Alcohol/week: 2.0 standard drinks    Types: 2 Glasses of wine per week    Comment: occ wine  . Drug use: No  . Sexual activity: Not on file

## 2020-02-28 ENCOUNTER — Other Ambulatory Visit: Payer: Self-pay | Admitting: Internal Medicine

## 2020-03-02 ENCOUNTER — Other Ambulatory Visit: Payer: Self-pay | Admitting: Internal Medicine

## 2020-03-05 ENCOUNTER — Ambulatory Visit: Payer: Medicare Other | Admitting: Cardiology

## 2020-03-05 ENCOUNTER — Encounter: Payer: Self-pay | Admitting: Cardiology

## 2020-03-05 ENCOUNTER — Other Ambulatory Visit: Payer: Self-pay

## 2020-03-05 VITALS — BP 152/90 | HR 80 | Ht 66.0 in | Wt 122.2 lb

## 2020-03-05 DIAGNOSIS — Z712 Person consulting for explanation of examination or test findings: Secondary | ICD-10-CM | POA: Diagnosis not present

## 2020-03-05 DIAGNOSIS — R0989 Other specified symptoms and signs involving the circulatory and respiratory systems: Secondary | ICD-10-CM | POA: Diagnosis not present

## 2020-03-05 DIAGNOSIS — E7800 Pure hypercholesterolemia, unspecified: Secondary | ICD-10-CM | POA: Diagnosis not present

## 2020-03-05 DIAGNOSIS — Z7189 Other specified counseling: Secondary | ICD-10-CM

## 2020-03-05 NOTE — Progress Notes (Signed)
Cardiology Office Note:    Date:  03/05/2020   ID:  [name], DOB May 17, 1941, MRN [id number]  PCP:  [name], MD  Cardiologist:  [name], MD  Referring MD: [name], MD   CC: follow up  History of Present Illness:    [name] is a 79 y.o. female with a hx of lower extremity edema, hypertension, hyperlipidemia who is seen for follow up today.  I initially met her 12/04/19 as a new consult at the request of [name], [name], MD for the evaluation and management of labile blood pressure.  Today: Has been struggling with sciatica, has had two surgeries and still recovering. Had her trigeminal neuralgia fixed. Working on getting on her house ready to sell, working to pack everything. Feels it has been a "crappy" few months.  No significant leg swelling. Still with intermittent leg pain, not exertional, has been since her sciatica surgery. BP at Dr. [name] last office visit was 142/88.   Brings home BP log. Range 104/65-171/105 in mid day after medications. Took a reading this AM after not sleeping, before medications, and in pain, and it was 202/126. Has already come down from this peak today. Average around 130s-140s/80s. We reviewed her echo, normal systolic function but mild diastolic dysfunction.   Only two times she has felt poorly with high blood pressure she went to ER. Otherwise she cannot tell if her blood pressure is elevated.  Denies chest pain, shortness of breath at rest or with normal exertion. No PND, orthopnea, LE edema or unexpected weight gain. No syncope or palpitations.  Past Medical History:  Diagnosis Date   Abdominal pain 05/29/2013   s/p rx of cephalo resistant e coli   but last rx NG  now residular ?  bladder sx repeat cx sx rx to ty and uro consult    ADJ DISORDER WITH MIXED ANXIETY & DEPRESSED MOOD 03/03/2010   Qualifier: Diagnosis of  By: [name], [name]    Closed head injury 02/01/2011   from syncope  and had scalp laceration  neg ct .     Closed head injury 5-6 yrs ago   Colitis 06/24/252   Complication of anesthesia    migraine several hours after general anesthesia   Fatty liver    Gall stones 2016   see ct scan neg HIDA    GERD (gastroesophageal reflux disease)    Hearing aid worn    HOH (hard of hearing)    both ears   Hyperlipidemia    Hypertension    echo nl lv function  mild dilitation 2009   Kidney infection    few yrs ago in hospital   Medication side effect 09/02/2010   Poss muscle se of 10 crestor    Migraine    hypnic HA eval by Dr. 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    Pyelonephritis 03/12/2015   Sensation of pain in anesthetized distribution of trigeminal nerve    Syncope 02/01/2011   In shower on vacation  sustained head laceration  8 sutures Had ed visit neg head ct labs and x ray    Trigeminal neuralgia pain     Past Surgical History:  Procedure Laterality Date   ABDOMINAL HYSTERECTOMY  2002   tubal   CARDIAC CATHETERIZATION  2000   chest pains neg   CHOLECYSTECTOMY N/A 02/21/2017   Procedure: LAPAROSCOPIC CHOLECYSTECTOMY WITH INTRAOPERATIVE CHOLANGIOGRAM;  Surgeon: Armandina Gemma, MD;  Location: [facility];  Service: General;  Laterality: N/A;   COLONOSCOPY     multiple   CRANIOTOMY  12/09/2011   nerve decompression right trigeminal    DOPPLER ECHOCARDIOGRAPHY  2009   nl lv function mild lv dilitation   EYE SURGERY Bilateral    ioc for catatracts   laparoscopic gallbladder surgery  02/16/2017   Fax from [place] Bilateral 2002   rt shoulder surgery      Current Medications: Current Outpatient Medications on File Prior to Visit  Medication Sig   Acetaminophen (TYLENOL PO) Take 2 tablets by mouth as needed.   albuterol (PROVENTIL HFA;VENTOLIN HFA) 108 (90 Base) MCG/ACT inhaler Inhale 2 puffs into the lungs every 6 (six) hours as needed.   alendronate  (FOSAMAX) 70 MG tablet Take 1 tablet (70 mg total) by mouth every 7 (seven) days. Take with a full glass of water on an empty stomach.   benazepril (LOTENSIN) 20 MG tablet TAKE 1 TABLET(20 MG) BY MOUTH DAILY   budesonide-formoterol (SYMBICORT) 160-4.5 MCG/ACT inhaler Inhale 2 puffs into the lungs 2 (two) times daily.   carvedilol (COREG) 25 MG tablet TAKE 1 TABLET(25 MG) BY MOUTH TWICE DAILY WITH A MEAL   cloNIDine (CATAPRES) 0.1 MG tablet TAKE 1 TABLET(0.1 MG) BY MOUTH TWICE DAILY   conjugated estrogens (PREMARIN) vaginal cream Place 1 Applicatorful vaginally 2 (two) times a week.   fluticasone (FLONASE) 50 MCG/ACT nasal spray Place 2 sprays into both nostrils as needed for allergies or rhinitis.   furosemide (LASIX) 20 MG tablet TAKE 1 TABLET(20 MG) BY MOUTH DAILY   lamoTRIgine (LAMICTAL) 25 MG tablet TAKE 2 TABLETS(50 MG) BY MOUTH TWICE DAILY   omeprazole (PRILOSEC) 20 MG capsule Take 20 mg by mouth daily.   ondansetron (ZOFRAN-ODT) 4 MG disintegrating tablet Take 1-2 tablets (4-8 mg total) by mouth every 8 (eight) hours as needed for nausea.   OVER THE COUNTER MEDICATION OTC eye drops   Butalbital-APAP-Caff-Cod 50-300-40-30 MG CAPS Take 1-2 capsules by mouth every 4 (four) hours as needed.   HYDROcodone-acetaminophen (NORCO/VICODIN) 5-325 MG tablet Take 1 tablet by mouth every 6 (six) hours as needed.   oxyCODONE (OXY IR/ROXICODONE) 5 MG immediate release tablet Take 5 mg by mouth.   No current facility-administered medications on file prior to visit.     Allergies:   Sulfamethoxazole-trimethoprim and Sulfur   Social History   Tobacco Use   Smoking status: Never Smoker   Smokeless tobacco: Never Used  Vaping Use   Vaping Use: Never used  Substance Use Topics   Alcohol use: Yes    Alcohol/week: 2.0 standard drinks    Types: 2 Glasses of wine per week    Comment: occ wine   Drug use: No    Family History: family history includes Alcohol abuse in her father;  Cancer in her paternal aunt; Diabetes in her brother; Hypertension in an other family member; Ovarian cancer in her mother; Seizures in her daughter; Stroke in her father and mother. There is no history of Colon cancer.  ROS:   Please see the history of present illness.  Additional pertinent ROS otherwise unremarkable.  EKGs/Labs/Other Studies Reviewed:    The following studies were reviewed today: Echo 12/18/19 1. Left ventricular ejection fraction, by estimation, is 55 to 60%. The  left ventricle has normal function. The left ventricle has no regional  wall motion abnormalities. There is moderate asymmetric left ventricular  hypertrophy of the  basal-septal  segment. Left ventricular diastolic parameters are consistent with Grade  II diastolic dysfunction (pseudonormalization). Elevated left atrial  pressure.  2. Right ventricular systolic function is normal. The right ventricular  size is normal. There is normal pulmonary artery systolic pressure. The  estimated right ventricular systolic pressure is 16.3 mmHg.  3. The mitral valve is normal in structure. Trivial mitral valve  regurgitation.  4. The aortic valve is tricuspid. Aortic valve regurgitation is not  visualized. No aortic stenosis is present.  5. The inferior vena cava is normal in size with <50% respiratory  variability, suggesting right atrial pressure of 8 mmHg.   Echo 11/28/2017 - Left ventricle: The cavity size was normal. There was moderate  concentric hypertrophy. Systolic function was normal. The  estimated ejection fraction was in the range of 55% to 60%. Wall  motion was normal; there were no regional wall motion  abnormalities. Features are consistent with a pseudonormal left  ventricular filling pattern, with concomitant abnormal relaxation  and increased filling pressure (grade 2 diastolic dysfunction).  - Pulmonary arteries: PA peak pressure: 37 mm Hg (S).  - Pericardium, extracardiac:  Ascites was noted.   Impressions:   - Normal LV ejection fraction and no regional wall motion  abnormalities, diastolic dysfunction noted. Elevated right sided  filling pressures. Ascites present.   EKG:  EKG is personally reviewed.  The ekg ordered 12/04/19 demonstrates sinus bradycardia at 58 bpm  Recent Labs: 05/27/2019: TSH 0.54 08/29/2019: ALT 18 11/07/2019: BUN 19; Creatinine, Ser 0.90; Hemoglobin 14.5; Platelets 167; Potassium 3.8; Sodium 142  Recent Lipid Panel    Component Value Date/Time   CHOL 279 (H) 05/27/2019 1045   TRIG 78.0 05/27/2019 1045   TRIG 192 (H) 03/17/2006 0820   HDL 118.80 05/27/2019 1045   CHOLHDL 2 05/27/2019 1045   VLDL 15.6 05/27/2019 1045   LDLCALC 145 (H) 05/27/2019 1045   LDLDIRECT 135.9 10/16/2012 0840    Physical Exam:    VS:  BP (!) 152/90    Pulse 80    Ht 5' 6"  (1.676 m)    Wt 122 lb 3.2 oz (55.4 kg)    SpO2 96%    BMI 19.72 kg/m     Wt Readings from Last 3 Encounters:  03/05/20 122 lb 3.2 oz (55.4 kg)  12/16/19 128 lb (58.1 kg)  12/04/19 128 lb 6.4 oz (58.2 kg)    GEN: Well nourished, well developed in no acute distress HEENT: Normal, moist mucous membranes NECK: No JVD CARDIAC: regular rhythm, normal S1 and S2, no rubs or gallops. No murmur. VASCULAR: Radial and DP pulses 2+ bilaterally. No carotid bruits RESPIRATORY:  Clear to auscultation without rales, wheezing or rhonchi  ABDOMEN: Soft, non-tender, non-distended MUSCULOSKELETAL:  Ambulates independently SKIN: Warm and dry, no edema NEUROLOGIC:  Alert and oriented x 3. No focal neuro deficits noted. PSYCHIATRIC:  Normal affect   ASSESSMENT:    1. Labile hypertension   2. Encounter to discuss test results   3. Cardiac risk counseling   4. Counseling on health promotion and disease prevention   5. Hypercholesterolemia    PLAN:    Lower extremity edema: resolved -reviewed echo results today  Labile hypertension -challenging given range. Largely  asymptomatic -reviewed home readings, average is acceptable -would not overtreat as she reports intermittent hypotension -instructed that pain, illness, stress, etc can raise blood pressure -hyponatremia limites diuretic use -she will contact me if numbers trend higher -currently on: Benazepril 20 mg at night Carvedilol 25 mg  twice a day Furosemide 20 mg in the morning Clonidine 0.1 mg twice a day  Hypercholesterolemia: Lipids reviewed from 05/27/19. Tchol 279, HDL 118, LDL 145, TG 78 We have previously discussed, given age/numbers, will not start statin  Cardiac risk counseling and prevention recommendations: -recommend heart healthy/Mediterranean diet, with whole grains, fruits, vegetable, fish, lean meats, nuts, and olive oil. Limit salt. -recommend moderate walking, 3-5 times/week for 30-50 minutes each session. Aim for at least 150 minutes.week. Goal should be pace of 3 miles/hours, or walking 1.5 miles in 30 minutes -recommend avoidance of tobacco products. Avoid excess alcohol. -ASCVD risk score: The ASCVD Risk score Mikey Bussing DC Jr., et al., 2013) failed to calculate for the following reasons:   The 2013 ASCVD risk score is only valid for ages 51 to 85    Plan for follow up: 55 mos  [name], MD, PhD [place]   [facility]    Medication Adjustments/Labs and Tests Ordered: Current medicines are reviewed at length with the patient today.  Concerns regarding medicines are outlined above.  No orders of the defined types were placed in this encounter.  No orders of the defined types were placed in this encounter.   Patient Instructions  Medication Instructions:  Your Physician recommend you continue on your current medication as directed.    *If you need a refill on your cardiac medications before your next appointment, please call your pharmacy*   Lab Work: None ordered   Testing/Procedures: None ordered    Follow-Up: At [facility], you and  your health needs are our priority.  As part of our continuing mission to provide you with exceptional heart care, we have created designated Provider Care Teams.  These Care Teams include your primary Cardiologist (physician) and Advanced Practice Providers (APPs -  Physician Assistants and Nurse Practitioners) who all work together to provide you with the care you need, when you need it.  We recommend signing up for the patient portal called "MyChart".  Sign up information is provided on this After Visit Summary.  MyChart is used to connect with patients for Virtual Visits (Telemedicine).  Patients are able to view lab/test results, encounter notes, upcoming appointments, etc.  Non-urgent messages can be sent to your provider as well.   To learn more about what you can do with MyChart, go to [email or web address].    Your next appointment:   6 month(s)  The format for your next appointment:   In Person  Provider:   [name], MD      Signed, [name], MD PhD 03/05/2020   [place]

## 2020-03-05 NOTE — Patient Instructions (Signed)
Medication Instructions:  Your Physician recommend you continue on your current medication as directed.    *If you need a refill on your cardiac medications before your next appointment, please call your pharmacy*   Lab Work: None ordered   Testing/Procedures: None ordered    Follow-Up: At [facility], you and your health needs are our priority.  As part of our continuing mission to provide you with exceptional heart care, we have created designated Provider Care Teams.  These Care Teams include your primary Cardiologist (physician) and Advanced Practice Providers (APPs -  Physician Assistants and Nurse Practitioners) who all work together to provide you with the care you need, when you need it.  We recommend signing up for the patient portal called "MyChart".  Sign up information is provided on this After Visit Summary.  MyChart is used to connect with patients for Virtual Visits (Telemedicine).  Patients are able to view lab/test results, encounter notes, upcoming appointments, etc.  Non-urgent messages can be sent to your provider as well.   To learn more about what you can do with MyChart, go to [email or web address].    Your next appointment:   6 month(s)  The format for your next appointment:   In Person  Provider:   [name], MD

## 2020-03-12 ENCOUNTER — Other Ambulatory Visit: Payer: Self-pay

## 2020-03-12 NOTE — Telephone Encounter (Signed)
Gil-sending to you as FYI. Am sending message to [employer] as well.

## 2020-03-17 ENCOUNTER — Ambulatory Visit: Payer: Medicare Other | Admitting: Internal Medicine

## 2020-03-17 ENCOUNTER — Encounter: Payer: Self-pay | Admitting: Internal Medicine

## 2020-03-17 ENCOUNTER — Other Ambulatory Visit: Payer: Self-pay

## 2020-03-17 VITALS — BP 144/84 | HR 66 | Temp 98.4°F | Ht 66.0 in | Wt 125.6 lb

## 2020-03-17 DIAGNOSIS — Z79899 Other long term (current) drug therapy: Secondary | ICD-10-CM

## 2020-03-17 DIAGNOSIS — G43109 Migraine with aura, not intractable, without status migrainosus: Secondary | ICD-10-CM

## 2020-03-17 DIAGNOSIS — M48062 Spinal stenosis, lumbar region with neurogenic claudication: Secondary | ICD-10-CM

## 2020-03-17 DIAGNOSIS — M5116 Intervertebral disc disorders with radiculopathy, lumbar region: Secondary | ICD-10-CM

## 2020-03-17 DIAGNOSIS — M79604 Pain in right leg: Secondary | ICD-10-CM

## 2020-03-17 DIAGNOSIS — Z23 Encounter for immunization: Secondary | ICD-10-CM | POA: Diagnosis not present

## 2020-03-17 DIAGNOSIS — R0989 Other specified symptoms and signs involving the circulatory and respiratory systems: Secondary | ICD-10-CM | POA: Diagnosis not present

## 2020-03-17 MED ORDER — OMEPRAZOLE 20 MG PO CPDR
20.0000 mg | DELAYED_RELEASE_CAPSULE | Freq: Every day | ORAL | 2 refills | Status: DC
Start: 2020-03-17 — End: 2020-05-25

## 2020-03-17 NOTE — Patient Instructions (Addendum)
Can try zofran before nucynta  In case  Tolerates  beter   Tald  with pain management about  Oxycodone  Causing headache   Side effect  The butalbital with codeine can cause rebound headache .   Will refill omeprazole can  try  Taking   4-5 day per week  Works better if 30 - 60 minutes pre meal.   Plan  FU  In  4-6 months  Or as needed.

## 2020-03-17 NOTE — Progress Notes (Signed)
Chief Complaint  Patient presents with  . Follow-up    HPI: [name] 79 y.o. come in for Chronic disease management   FU  Refill omeprazole  Not reember about starting but no gi sx now  cv  See last not dr brigitte  Neurology for [name] The firoricet has been refilled  In interm but  Using more  Using to rx headache from the oxycodon  butabitol  Was used for post pain med .   Pain management   Surgery hlped the right side  and left side   Had cyst at nerve  Removal   Redid surgery  And then oxycodone  And if taking 2 x get HA s  nucyta caused too much nausea  No falls  A month + out of second surgery  Sometimes forgets to take the  fosamax  Weekly  ROS: See pertinent positives and negatives per HPI. No uti sx    Past Medical History:  Diagnosis Date  . Abdominal pain 05/29/2013   s/p rx of cephalo resistant e coli   but last rx NG  now residular ?  bladder sx repeat cx sx rx to ty and uro consult   . ADJ DISORDER WITH MIXED ANXIETY & DEPRESSED MOOD 03/03/2010   Qualifier: Diagnosis of  By: [name], [name]   . Closed head injury 02/01/2011   from syncope and had scalp laceration  neg ct .    Marland Kitchen Closed head injury 5-6 yrs ago  . Colitis 11/27/2017  . Complication of anesthesia    migraine several hours after general anesthesia  . Fatty liver   . Gall stones 2016   see ct scan neg HIDA   . GERD (gastroesophageal reflux disease)   . Hearing aid worn   . HOH (hard of hearing)    both ears  . Hyperlipidemia   . Hypertension    echo nl lv function  mild dilitation 2009  . Kidney infection    few yrs ago in hospital  . Medication side effect 09/02/2010   Poss muscle se of 10 crestor   . Migraine    hypnic HA eval by Dr. Earley Favor in the past  . Polycythemia   . 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 Pyelonephritis 03/12/2015  . 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 Syncope 02/01/2011   In shower on vacation  sustained head laceration  8  sutures Had ed visit neg head ct labs and x ray   . Trigeminal neuralgia pain     Family History  Problem Relation Age of Onset  . Ovarian cancer Mother   . Stroke Mother   . Alcohol abuse Father   . Stroke Father   . Diabetes Brother   . Cancer Paternal Aunt        leukemia, unknown type  . Seizures Daughter   . Hypertension Other   . Colon cancer Neg Hx     Social History   Socioeconomic History  . Marital status: Married    Spouse name: Not on file  . Number of children: 2  . Years of education: Not on file  . Highest education level: Not on file  Occupational History    Comment: retired Forensic psychologist  Tobacco Use  . Smoking status: Never Smoker  . Smokeless tobacco: Never Used  Vaping Use  . Vaping Use: Never used  Substance and Sexual Activity  .  Alcohol use: Yes    Alcohol/week: 2.0 standard drinks    Types: 2 Glasses of wine per week    Comment: occ wine  . Drug use: No  . Sexual activity: Not on file  Other Topics Concern  . Not on file  Social History Narrative   Married   HH of 2-3 (god daughter)   Pets 2 dogs   Non smoker    Child is a physician   G2P2      Caffeine: 2 cups/day   Social Determinants of Health   Financial Resource Strain:   . Difficulty of Paying Living Expenses: Not on file  Food Insecurity:   . Worried About Charity fundraiser in the Last Year: Not on file  . Ran Out of Food in the Last Year: Not on file  Transportation Needs:   . Lack of Transportation (Medical): Not on file  . Lack of Transportation (Non-Medical): Not on file  Physical Activity:   . Days of Exercise per Week: Not on file  . Minutes of Exercise per Session: Not on file  Stress:   . Feeling of Stress : Not on file  Social Connections:   . Frequency of Communication with Friends and Family: Not on file  . Frequency of Social Gatherings with Friends and Family: Not on file  . Attends Religious Services: Not on file  . Active Member of Clubs or  Organizations: Not on file  . Attends Archivist Meetings: Not on file  . Marital Status: Not on file    Outpatient Medications Prior to Visit  Medication Sig Dispense Refill  . Acetaminophen (TYLENOL PO) Take 2 tablets by mouth as needed.    Marland Kitchen albuterol (PROVENTIL HFA;VENTOLIN HFA) 108 (90 Base) MCG/ACT inhaler Inhale 2 puffs into the lungs every 6 (six) hours as needed. 1 Inhaler 2  . alendronate (FOSAMAX) 70 MG tablet Take 1 tablet (70 mg total) by mouth every 7 (seven) days. Take with a full glass of water on an empty stomach. 4 tablet 11  . benazepril (LOTENSIN) 20 MG tablet TAKE 1 TABLET(20 MG) BY MOUTH DAILY 90 tablet 1  . budesonide-formoterol (SYMBICORT) 160-4.5 MCG/ACT inhaler Inhale 2 puffs into the lungs 2 (two) times daily. 1 Inhaler 2  . carvedilol (COREG) 25 MG tablet TAKE 1 TABLET(25 MG) BY MOUTH TWICE DAILY WITH A MEAL 60 tablet 3  . cloNIDine (CATAPRES) 0.1 MG tablet TAKE 1 TABLET(0.1 MG) BY MOUTH TWICE DAILY 60 tablet 0  . conjugated estrogens (PREMARIN) vaginal cream Place 1 Applicatorful vaginally 2 (two) times a week.    . fluticasone (FLONASE) 50 MCG/ACT nasal spray Place 2 sprays into both nostrils as needed for allergies or rhinitis. 16 g 5  . furosemide (LASIX) 20 MG tablet TAKE 1 TABLET(20 MG) BY MOUTH DAILY 30 tablet 1  . HYDROcodone-acetaminophen (NORCO/VICODIN) 5-325 MG tablet Take 1 tablet by mouth every 6 (six) hours as needed.    . lamoTRIgine (LAMICTAL) 25 MG tablet TAKE 2 TABLETS(50 MG) BY MOUTH TWICE DAILY 120 tablet 0  . ondansetron (ZOFRAN-ODT) 4 MG disintegrating tablet Take 1-2 tablets (4-8 mg total) by mouth every 8 (eight) hours as needed for nausea. 60 tablet 3  . OVER THE COUNTER MEDICATION OTC eye drops    . oxyCODONE (OXY IR/ROXICODONE) 5 MG immediate release tablet Take 5 mg by mouth.    Marland Kitchen omeprazole (PRILOSEC) 20 MG capsule Take 20 mg by mouth daily.    . Butalbital-APAP-Caff-Cod 50-300-40-30 MG CAPS  Take 1-2 capsules by mouth every 4  (four) hours as needed. (Patient not taking: Reported on 03/17/2020)     No facility-administered medications prior to visit.     EXAM:  BP (!) 144/84 (BP Location: Left Arm, Patient Position: Sitting, Cuff Size: Normal)   Pulse 66   Temp 98.4 F (36.9 C) (Oral)   Ht 5' 6"  (1.676 m)   Wt 125 lb 9.6 oz (57 kg)   SpO2 98%   BMI 20.27 kg/m   Body mass index is 20.27 kg/m.  GENERAL: vitals reviewed and listed above, alert, oriented, appears well hydrated and in no acute distress HEENT: atraumatic, conjunctiva  clear, no obvious abnormalities on inspection of external nose and ears OP masked  NECK: no obvious masses on inspection palpation  LUNGS: clear to auscultation bilaterally, no wheezes, rales or rhonchi, good air movement CV: HRRR, no clubbing cyanosis or  peripheral edema nl cap refill  MS: moves all extremities without noticeable focal  Abnormality gait  Cane  Arises  Stiff and then able to walk with fairlyeas no  Weakness obvious  rightant leg near shin splint area  Covered with lidocain patch  Feet puffy but  Improved from prev exams  PSYCH: pleasant and cooperative, no obvious depression or anxiety Lab Results  Component Value Date   WBC 5.1 11/07/2019   HGB 14.5 11/07/2019   HCT 45.5 11/07/2019   PLT 167 11/07/2019   GLUCOSE 101 (H) 11/07/2019   CHOL 279 (H) 05/27/2019   TRIG 78.0 05/27/2019   HDL 118.80 05/27/2019   LDLDIRECT 135.9 10/16/2012   LDLCALC 145 (H) 05/27/2019   ALT 18 08/29/2019   AST 25 08/29/2019   NA 142 11/07/2019   K 3.8 11/07/2019   CL 106 11/07/2019   CREATININE 0.90 11/07/2019   BUN 19 11/07/2019   CO2 27 11/07/2019   TSH 0.54 05/27/2019   INR 1.23 03/12/2015   HGBA1C 5.5 05/25/2018   BP Readings from Last 3 Encounters:  03/17/20 (!) 144/84  03/05/20 (!) 152/90  12/16/19 (!) 142/88    ASSESSMENT AND PLAN:  Discussed the following assessment and plan:  Medication management  Need for influenza vaccination - Plan: Flu Vaccine  QUAD High Dose(Fluad)  Labile hypertension  Migraine with aura and without status migrainosus, not intractable  Spinal stenosis of lumbar region with neurogenic claudication - s/p surgery  Radiculopathy due to lumbar intervertebral disc disorder - s/p surgery x 2   Leg pain, anterior, right - residual pst surgery  Record review  Ht controlled enough   Cont fosamax for now forgets  Reviewed other options   prolia   Reclast. Can try dec omeprazole  As possible  Risk benefit . Pain management   Disc per Dr Vertell Limber and neuro et al  She takes care   per se of  Following meds   But pain makes her grouchy  nad bad attitude .  -Patient advised to return or notify health care team  if  new concerns arise.  Patient Instructions  Can try zofran before nucynta  In case  Tolerates  beter   Tald  with pain management about  Oxycodone  Causing headache   Side effect  The butalbital with codeine can cause rebound headache .   Will refill omeprazole can  try  Taking   4-5 day per week  Works better if 30 - 60 minutes pre meal.   Plan  FU  In  4-6 months  Or as needed.  [name]. Anam Bobby M.D.  Lower extremity edema: resolved -reviewed echo results today  Labile hypertension -challenging given range. Largely asymptomatic -reviewed home readings, average is acceptable -would not overtreat as she reports intermittent hypotension -instructed that pain, illness, stress, etc can raise blood pressure -hyponatremia limites diuretic use -she will contact me if numbers trend higher -currently on: Benazepril 20 mg at night Carvedilol 25 mg twice a day Furosemide 20 mg in the morning Clonidine 0.1 mg twice a day  Hypercholesterolemia: Lipids reviewed from 05/27/19. Tchol 279, HDL 118, LDL 145, TG 78 We have previously discussed, given age/numbers, will not start statin  FRAX score: 10 year major osteoporotic risk: 21.4%. 10 year hip fracture risk: 6.0%. The thresholds for treatment  are 20% and 3%, respectively Jan 2020

## 2020-03-19 ENCOUNTER — Other Ambulatory Visit: Payer: Self-pay | Admitting: Family Medicine

## 2020-03-24 NOTE — Telephone Encounter (Signed)
So ok with me to make appt but I dont see her on the schedule     Please  Help her get appt for a time that fits

## 2020-03-25 ENCOUNTER — Other Ambulatory Visit: Payer: Self-pay

## 2020-03-25 ENCOUNTER — Ambulatory Visit (INDEPENDENT_AMBULATORY_CARE_PROVIDER_SITE_OTHER): Payer: Medicare Other

## 2020-03-25 DIAGNOSIS — Z0000 Encounter for general adult medical examination without abnormal findings: Secondary | ICD-10-CM | POA: Diagnosis not present

## 2020-03-25 NOTE — Progress Notes (Signed)
Virtual Visit via Telephone Note  I connected with  [name] on 03/25/20 at  3:15 PM EDT by telephone and verified that I am speaking with the correct person using two identifiers.  Medicare Annual Wellness visit completed telephonically due to Covid-19 pandemic.   Persons participating in this call: This Health Coach and this patient.   Location: Patient: Home Provider: Office   I discussed the limitations, risks, security and privacy concerns of performing an evaluation and management service by telephone and the availability of in person appointments. The patient expressed understanding and agreed to proceed.  Unable to perform video visit due to video visit attempted and failed and/or patient does not have video capability.   Some vital signs may be absent or patient reported.   Willette Brace, LPN    Subjective:   [name] is a 79 y.o. female who presents for Medicare Annual (Subsequent) preventive examination.  Review of Systems     Cardiac Risk Factors include: advanced age (>74mn, >>65women);hypertension     Objective:    There were no vitals filed for this visit. There is no height or weight on file to calculate BMI.  Advanced Directives 03/25/2020 10/24/2019 01/02/2018 11/27/2017 02/21/2017 02/21/2017 02/20/2017  Does Patient Have a Medical Advance Directive? Yes No Yes Yes - Yes Yes  Type of AParamedicof ADusonLiving will - [place] HeightsLiving will [place]Living will Healthcare Power of [place]Living will [place]Living will  Does patient want to make changes to medical advance directive? - - - No - Patient declined No - Patient declined - No - Patient declined  Copy of [place] Chart? Yes - validated most recent copy scanned in chart (See row information) - - No - copy requested No - copy requested No -  copy requested No - copy requested  Would patient like information on creating a medical advance directive? - - - - - - -    Current Medications (verified) Outpatient Encounter Medications as of 03/25/2020  Medication Sig  . Acetaminophen (TYLENOL PO) Take 2 tablets by mouth as needed.  .Marland Kitchenalbuterol (PROVENTIL HFA;VENTOLIN HFA) 108 (90 Base) MCG/ACT inhaler Inhale 2 puffs into the lungs every 6 (six) hours as needed.  .Marland Kitchenalendronate (FOSAMAX) 70 MG tablet Take 1 tablet (70 mg total) by mouth every 7 (seven) days. Take with a full glass of water on an empty stomach.  . benazepril (LOTENSIN) 20 MG tablet TAKE 1 TABLET(20 MG) BY MOUTH DAILY  . budesonide-formoterol (SYMBICORT) 160-4.5 MCG/ACT inhaler Inhale 2 puffs into the lungs 2 (two) times daily.  . carvedilol (COREG) 12.5 MG tablet TAKE 1 TABLET BY MOUTH TWICE DAILY  . carvedilol (COREG) 25 MG tablet TAKE 1 TABLET(25 MG) BY MOUTH TWICE DAILY WITH A MEAL  . cloNIDine (CATAPRES) 0.1 MG tablet TAKE 1 TABLET(0.1 MG) BY MOUTH TWICE DAILY  . conjugated estrogens (PREMARIN) vaginal cream Place 1 Applicatorful vaginally 2 (two) times a week.  . fluticasone (FLONASE) 50 MCG/ACT nasal spray Place 2 sprays into both nostrils as needed for allergies or rhinitis.  . furosemide (LASIX) 20 MG tablet TAKE 1 TABLET(20 MG) BY MOUTH DAILY  . omeprazole (PRILOSEC) 20 MG capsule Take 1 capsule (20 mg total) by mouth daily.  . ondansetron (ZOFRAN-ODT) 4 MG disintegrating tablet Take 1-2 tablets (4-8 mg total) by mouth every 8 (eight) hours as needed for nausea.  .Marland KitchenOVER  THE COUNTER MEDICATION OTC eye drops  . [DISCONTINUED] Butalbital-APAP-Caff-Cod 50-300-40-30 MG CAPS Take 1-2 capsules by mouth every 4 (four) hours as needed. (Patient not taking: Reported on 03/17/2020)  . [DISCONTINUED] HYDROcodone-acetaminophen (NORCO/VICODIN) 5-325 MG tablet Take 1 tablet by mouth every 6 (six) hours as needed. (Patient not taking: Reported on 03/25/2020)  . [DISCONTINUED]  lamoTRIgine (LAMICTAL) 25 MG tablet TAKE 2 TABLETS(50 MG) BY MOUTH TWICE DAILY (Patient not taking: Reported on 03/25/2020)  . [DISCONTINUED] oxyCODONE (OXY IR/ROXICODONE) 5 MG immediate release tablet Take 5 mg by mouth. (Patient not taking: Reported on 03/25/2020)   No facility-administered encounter medications on file as of 03/25/2020.    Allergies (verified) Sulfamethoxazole-trimethoprim and Sulfur   History: Past Medical History:  Diagnosis Date  . Abdominal pain 05/29/2013   s/p rx of cephalo resistant e coli   but last rx NG  now residular ?  bladder sx repeat cx sx rx to ty and uro consult   . ADJ DISORDER WITH MIXED ANXIETY & DEPRESSED MOOD 03/03/2010   Qualifier: Diagnosis of  By: [name], [name]   . Closed head injury 02/01/2011   from syncope and had scalp laceration  neg ct .    Marland Kitchen Closed head injury 5-6 yrs ago  . Colitis 11/27/2017  . Complication of anesthesia    migraine several hours after general anesthesia  . Fatty liver   . Gall stones 2016   see ct scan neg HIDA   . GERD (gastroesophageal reflux disease)   . Hearing aid worn   . HOH (hard of hearing)    both ears  . Hyperlipidemia   . Hypertension    echo nl lv function  mild dilitation 2009  . Kidney infection    few yrs ago in hospital  . Medication side effect 09/02/2010   Poss muscle se of 10 crestor   . Migraine    hypnic HA eval by Dr. Earley Favor in the past  . Polycythemia   . 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 Pyelonephritis 03/12/2015  . 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 Syncope 02/01/2011   In shower on vacation  sustained head laceration  8 sutures Had ed visit neg head ct labs and x ray   . Trigeminal neuralgia pain    Past Surgical History:  Procedure Laterality Date  . ABDOMINAL HYSTERECTOMY  2002   tubal  . CARDIAC CATHETERIZATION  2000   chest pains neg  . CHOLECYSTECTOMY N/A 02/21/2017   Procedure: LAPAROSCOPIC CHOLECYSTECTOMY WITH INTRAOPERATIVE  CHOLANGIOGRAM;  Surgeon: Armandina Gemma, MD;  Location: [facility];  Service: General;  Laterality: N/A;  . COLONOSCOPY     multiple  . CRANIOTOMY  12/09/2011   nerve decompression right trigeminal   . DOPPLER ECHOCARDIOGRAPHY  2009   nl lv function mild lv dilitation  . EYE SURGERY Bilateral    ioc for catatracts  . laparoscopic gallbladder surgery  02/16/2017   Fax from [facility]  . OOPHORECTOMY Bilateral 2002  . rt shoulder surgery     Family History  Problem Relation Age of Onset  . Ovarian cancer Mother   . Stroke Mother   . Alcohol abuse Father   . Stroke Father   . Diabetes Brother   . Cancer Paternal Aunt        leukemia, unknown type  . Seizures Daughter   . Hypertension Other   . Colon cancer Neg Hx  Social History   Socioeconomic History  . Marital status: Married    Spouse name: Not on file  . Number of children: 2  . Years of education: Not on file  . Highest education level: Not on file  Occupational History    Comment: retired Forensic psychologist  Tobacco Use  . Smoking status: Never Smoker  . Smokeless tobacco: Never Used  Vaping Use  . Vaping Use: Never used  Substance and Sexual Activity  . Alcohol use: Yes    Alcohol/week: 2.0 standard drinks    Types: 2 Glasses of wine per week    Comment: occ wine  . Drug use: No  . Sexual activity: Not on file  Other Topics Concern  . Not on file  Social History Narrative   Married   HH of 2-3 (god daughter)   Pets 2 dogs   Non smoker    Child is a physician   G2P2      Caffeine: 2 cups/day   Social Determinants of Health   Financial Resource Strain: Low Risk   . Difficulty of Paying Living Expenses: Not hard at all  Food Insecurity: No Food Insecurity  . Worried About Charity fundraiser in the Last Year: Never true  . Ran Out of Food in the Last Year: Never true  Transportation Needs: No Transportation Needs  . Lack of Transportation (Medical): No  . Lack of Transportation  (Non-Medical): No  Physical Activity: Inactive  . Days of Exercise per Week: 0 days  . Minutes of Exercise per Session: 0 min  Stress: No Stress Concern Present  . Feeling of Stress : Not at all  Social Connections: Moderately Integrated  . Frequency of Communication with Friends and Family: More than three times a week  . Frequency of Social Gatherings with Friends and Family: Once a week  . Attends Religious Services: 1 to 4 times per year  . Active Member of Clubs or Organizations: No  . Attends Archivist Meetings: Never  . Marital Status: Married    Tobacco Counseling Counseling given: Not Answered   Clinical Intake:  Pre-visit preparation completed: Yes  Pain : No/denies pain     BMI - recorded: 20.28 Nutritional Status: BMI of 19-24  Normal Nutritional Risks: None Diabetes: No  How often do you need to have someone help you when you read instructions, pamphlets, or other written materials from your doctor or pharmacy?: 1 - Never  Diabetic?No  Interpreter Needed?: No  Information entered by :: Charlott Rakes, LPN   Activities of Daily Living In your present state of health, do you have any difficulty performing the following activities: 03/25/2020  Hearing? Y  Comment wears hearing  Vision? N  Difficulty concentrating or making decisions? N  Walking or climbing stairs? N  Dressing or bathing? N  Doing errands, shopping? N  Preparing Food and eating ? N  Using the Toilet? N  In the past six months, have you accidently leaked urine? N  Do you have problems with loss of bowel control? N  Managing your Medications? N  Managing your Finances? N  Housekeeping or managing your Housekeeping? N  Some recent data might be hidden    Patient Care Team: [name], [name], MD as PCP - General [name], MD as PCP - Cardiology (Cardiology) Cindie Crumbly (Neurosurgery) Irine Seal, MD as Attending Physician (Urology) Erline Levine, MD as  Consulting Physician (Neurosurgery) [name], MD as Consulting Physician (Neurology) Kipp Brood,  Mariam Dollar, [place] as Pharmacist (Pharmacist)  Indicate any recent Medical Services you may have received from other than [facility] providers in the past year (date may be approximate).     Assessment:   This is a routine wellness examination for [name].  Hearing/Vision screen  Hearing Screening   125Hz  250Hz  500Hz  1000Hz  2000Hz  3000Hz  4000Hz  6000Hz  8000Hz   Right ear:           Left ear:           Comments: Pt wears hearing aids   Vision Screening Comments: Pt follows up with [facility] for annual  Dietary issues and exercise activities discussed: Current Exercise Habits: The patient does not participate in regular exercise at present, Exercise limited by: Other - see comments (spine surgery)  Goals    . Patient Stated     None at this time      Depression Screen PHQ 2/9 Scores 03/25/2020 05/27/2019 05/24/2017 04/26/2016 04/06/2015 11/13/2013 11/13/2013  PHQ - 2 Score 0 0 0 0 0 0 0    Fall Risk Fall Risk  03/25/2020 03/17/2020 05/27/2019 05/25/2018 09/29/2017  Falls in the past year? 0 1 1 1  No  Number falls in past yr: 0 1 0 1 -  Injury with Fall? 0 1 1 1  -  Risk Factor Category  - - - - -  Risk for fall due to : Impaired balance/gait;Impaired mobility - History of fall(s) - -  Risk for fall due to: Comment - - - - -  Follow up Falls prevention discussed - Falls evaluation completed - -    Any stairs in or around the home? No  If so, are there any without handrails? No  Home free of loose throw rugs in walkways, pet beds, electrical cords, etc? Yes  Adequate lighting in your home to reduce risk of falls? Yes   ASSISTIVE DEVICES UTILIZED TO PREVENT FALLS:  Life alert? No  Use of a cane, walker or w/c? Yes  Grab bars in the bathroom? No  Shower chair or bench in shower? Yes  Elevated toilet seat or a handicapped toilet? No   TIMED UP AND GO:  Was the test performed? No .       Cognitive Function:     6CIT Screen 03/25/2020  What Year? 0 points  What month? 0 points  Count back from 20 0 points  Months in reverse 0 points  Repeat phrase 0 points    Immunizations Immunization History  Administered Date(s) Administered  . Fluad Quad(high Dose 65+) 02/18/2019, 03/17/2020  . Influenza Split 03/10/2011, 02/02/2012  . Influenza Whole 02/18/2008, 03/03/2010  . Influenza, High Dose Seasonal PF 03/04/2014, 02/01/2016, 02/22/2017, 03/20/2018  . Influenza,inj,Quad PF,6+ Mos 03/05/2013  . PFIZER SARS-COV-2 Vaccination 06/01/2019, 06/22/2019  . Pneumococcal Conjugate-13 11/13/2013  . Pneumococcal Polysaccharide-23 03/23/2015  . Td 05/23/1996  . Tdap 09/19/2011  . Zoster 09/19/2011    TDAP status: Up to date Flu Vaccine status: Up to date Pneumococcal vaccine status: Up to date Covid-19 vaccine status: Completed vaccines  Qualifies for Shingles Vaccine? Yes   Zostavax completed Yes   Shingrix Completed?: No.    Education has been provided regarding the importance of this vaccine. Patient has been advised to call insurance company to determine out of pocket expense if they have not yet received this vaccine. Advised may also receive vaccine at local pharmacy or Health Dept. Verbalized acceptance and understanding.  Screening Tests Health Maintenance  Topic Date Due  . TETANUS/TDAP  09/18/2021  . INFLUENZA VACCINE  Completed  . DEXA SCAN  Completed  . COVID-19 Vaccine  Completed  . PNA vac Low Risk Adult  Completed    Health Maintenance  There are no preventive care reminders to display for this patient.  Colorectal cancer screening: No longer required Mammogram status: Completed 07/17/19. Repeat every year Bone Density status: Completed 05/28/18. Results reflect: Bone density results: OSTEOPOROSIS. Repeat every 2 years.   Additional Screening  Vision Screening: Recommended annual ophthalmology exams for early detection of glaucoma and other  disorders of the eye. Is the patient up to date with their annual eye exam?  Yes  Who is the provider or what is the name of the office in which the patient attends annual eye exams? [facility]    Dental Screening: Recommended annual dental exams for proper oral hygiene  Community Resource Referral / Chronic Care Management: CRR required this visit?  No   CCM required this visit?  No      Plan:     I have personally reviewed and noted the following in the patient's chart:   . Medical and social history . Use of alcohol, tobacco or illicit drugs  . Current medications and supplements . Functional ability and status . Nutritional status . Physical activity . Advanced directives . List of other physicians . Hospitalizations, surgeries, and ER visits in previous 12 months . Vitals . Screenings to include cognitive, depression, and falls . Referrals and appointments  In addition, I have reviewed and discussed with patient certain preventive protocols, quality metrics, and best practice recommendations. A written personalized care plan for preventive services as well as general preventive health recommendations were provided to patient.     Willette Brace, LPN   32/06/252   Nurse Notes: None

## 2020-03-25 NOTE — Patient Instructions (Addendum)
[name] , Thank you for taking time to come for your Medicare Wellness Visit. I appreciate your ongoing commitment to your health goals. Please review the following plan we discussed and let me know if I can assist you in the future.   Screening recommendations/referrals: Colonoscopy: Done 04/01/14 Mammogram: Done 07/17/19 Bone Density: Done 05/28/18 Recommended yearly ophthalmology/optometry visit for glaucoma screening and checkup Recommended yearly dental visit for hygiene and checkup  Vaccinations: Influenza vaccine: Up to date Done 03/17/20 Pneumococcal vaccine: Up to date Tdap vaccine: Up to date Shingles vaccine: pt tstaed she has gotten 1st dose Covid-19:Completed 06/11/19 & 06/23/19  Advanced directives: Please bring a copy of your health care power of attorney and living will to the office at your convenience.  Conditions/risks identified: None at this time  Next appointment: Follow up in one year for your annual wellness visit    Preventive Care 65 Years and Older, Female Preventive care refers to lifestyle choices and visits with your health care provider that can promote health and wellness. What does preventive care include?  A yearly physical exam. This is also called an annual well check.  Dental exams once or twice a year.  Routine eye exams. Ask your health care provider how often you should have your eyes checked.  Personal lifestyle choices, including:  Daily care of your teeth and gums.  Regular physical activity.  Eating a healthy diet.  Avoiding tobacco and drug use.  Limiting alcohol use.  Practicing safe sex.  Taking low-dose aspirin every day.  Taking vitamin and mineral supplements as recommended by your health care provider. What happens during an annual well check? The services and screenings done by your health care provider during your annual well check will depend on your age, overall health, lifestyle risk factors, and family  history of disease. Counseling  Your health care provider may ask you questions about your:  Alcohol use.  Tobacco use.  Drug use.  Emotional well-being.  Home and relationship well-being.  Sexual activity.  Eating habits.  History of falls.  Memory and ability to understand (cognition).  Work and work Statistician.  Reproductive health. Screening  You may have the following tests or measurements:  Height, weight, and BMI.  Blood pressure.  Lipid and cholesterol levels. These may be checked every 5 years, or more frequently if you are over 56 years old.  Skin check.  Lung cancer screening. You may have this screening every year starting at age 46 if you have a 30-pack-year history of smoking and currently smoke or have quit within the past 15 years.  Fecal occult blood test (FOBT) of the stool. You may have this test every year starting at age 27.  Flexible sigmoidoscopy or colonoscopy. You may have a sigmoidoscopy every 5 years or a colonoscopy every 10 years starting at age 22.  Hepatitis C blood test.  Hepatitis B blood test.  Sexually transmitted disease (STD) testing.  Diabetes screening. This is done by checking your blood sugar (glucose) after you have not eaten for a while (fasting). You may have this done every 1-3 years.  Bone density scan. This is done to screen for osteoporosis. You may have this done starting at age 22.  Mammogram. This may be done every 1-2 years. Talk to your health care provider about how often you should have regular mammograms. Talk with your health care provider about your test results, treatment options, and if necessary, the need for more tests. Vaccines  Your health  care provider may recommend certain vaccines, such as:  Influenza vaccine. This is recommended every year.  Tetanus, diphtheria, and acellular pertussis (Tdap, Td) vaccine. You may need a Td booster every 10 years.  Zoster vaccine. You may need this after  age 55.  Pneumococcal 13-valent conjugate (PCV13) vaccine. One dose is recommended after age 23.  Pneumococcal polysaccharide (PPSV23) vaccine. One dose is recommended after age 55. Talk to your health care provider about which screenings and vaccines you need and how often you need them. This information is not intended to replace advice given to you by your health care provider. Make sure you discuss any questions you have with your health care provider. Document Released: 06/05/2015 Document Revised: 01/27/2016 Document Reviewed: 03/10/2015 Elsevier Interactive Patient Education  2017 [place] Prevention in the Home Falls can cause injuries. They can happen to people of all ages. There are many things you can do to make your home safe and to help prevent falls. What can I do on the outside of my home?  Regularly fix the edges of walkways and driveways and fix any cracks.  Remove anything that might make you trip as you walk through a door, such as a raised step or threshold.  Trim any bushes or trees on the path to your home.  Use bright outdoor lighting.  Clear any walking paths of anything that might make someone trip, such as rocks or tools.  Regularly check to see if handrails are loose or broken. Make sure that both sides of any steps have handrails.  Any raised decks and porches should have guardrails on the edges.  Have any leaves, snow, or ice cleared regularly.  Use sand or salt on walking paths during winter.  Clean up any spills in your garage right away. This includes oil or grease spills. What can I do in the bathroom?  Use night lights.  Install grab bars by the toilet and in the tub and shower. Do not use towel bars as grab bars.  Use non-skid mats or decals in the tub or shower.  If you need to sit down in the shower, use a plastic, non-slip stool.  Keep the floor dry. Clean up any water that spills on the floor as soon as it  happens.  Remove soap buildup in the tub or shower regularly.  Attach bath mats securely with double-sided non-slip rug tape.  Do not have throw rugs and other things on the floor that can make you trip. What can I do in the bedroom?  Use night lights.  Make sure that you have a light by your bed that is easy to reach.  Do not use any sheets or blankets that are too big for your bed. They should not hang down onto the floor.  Have a firm chair that has side arms. You can use this for support while you get dressed.  Do not have throw rugs and other things on the floor that can make you trip. What can I do in the kitchen?  Clean up any spills right away.  Avoid walking on wet floors.  Keep items that you use a lot in easy-to-reach places.  If you need to reach something above you, use a strong step stool that has a grab bar.  Keep electrical cords out of the way.  Do not use floor polish or wax that makes floors slippery. If you must use wax, use non-skid floor wax.  Do not have  throw rugs and other things on the floor that can make you trip. What can I do with my stairs?  Do not leave any items on the stairs.  Make sure that there are handrails on both sides of the stairs and use them. Fix handrails that are broken or loose. Make sure that handrails are as long as the stairways.  Check any carpeting to make sure that it is firmly attached to the stairs. Fix any carpet that is loose or worn.  Avoid having throw rugs at the top or bottom of the stairs. If you do have throw rugs, attach them to the floor with carpet tape.  Make sure that you have a light switch at the top of the stairs and the bottom of the stairs. If you do not have them, ask someone to add them for you. What else can I do to help prevent falls?  Wear shoes that:  Do not have high heels.  Have rubber bottoms.  Are comfortable and fit you well.  Are closed at the toe. Do not wear sandals.  If you  use a stepladder:  Make sure that it is fully opened. Do not climb a closed stepladder.  Make sure that both sides of the stepladder are locked into place.  Ask someone to hold it for you, if possible.  Clearly mark and make sure that you can see:  Any grab bars or handrails.  First and last steps.  Where the edge of each step is.  Use tools that help you move around (mobility aids) if they are needed. These include:  Canes.  Walkers.  Scooters.  Crutches.  Turn on the lights when you go into a dark area. Replace any light bulbs as soon as they burn out.  Set up your furniture so you have a clear path. Avoid moving your furniture around.  If any of your floors are uneven, fix them.  If there are any pets around you, be aware of where they are.  Review your medicines with your doctor. Some medicines can make you feel dizzy. This can increase your chance of falling. Ask your doctor what other things that you can do to help prevent falls. This information is not intended to replace advice given to you by your health care provider. Make sure you discuss any questions you have with your health care provider. Document Released: 03/05/2009 Document Revised: 10/15/2015 Document Reviewed: 06/13/2014 Elsevier Interactive Patient Education  2017 [employer].

## 2020-03-27 ENCOUNTER — Other Ambulatory Visit: Payer: Self-pay | Admitting: Internal Medicine

## 2020-03-31 ENCOUNTER — Telehealth: Payer: Self-pay

## 2020-03-31 DIAGNOSIS — I10 Essential (primary) hypertension: Secondary | ICD-10-CM

## 2020-03-31 DIAGNOSIS — M1990 Unspecified osteoarthritis, unspecified site: Secondary | ICD-10-CM

## 2020-03-31 NOTE — Telephone Encounter (Signed)
-----   Message from Viona Gilmore, [facility] sent at 03/31/2020  1:50 PM EST ----- Regarding: CCM referral Hi again [place],  Can you please put in a CCM referral for one of Dr. [name] patients Ms. Vida Roller?  Thank you. I really appreciate your help!  Best, [name]

## 2020-04-01 ENCOUNTER — Telehealth: Payer: Self-pay | Admitting: Pharmacist

## 2020-04-01 NOTE — Chronic Care Management (AMB) (Signed)
I    Chronic Care Management Pharmacy Assistant   Name: [name]  MRN: [id number] DOB: 1940-12-09  Reason for Encounter: Medication Review/Initial Questions for Pharmacist visit on 04/03/2020  Patient Questions: 1. Have you seen any other providers since your last visit? No 2. Any changes in your medications or health?  . Yes, she is trying to recover from a recent operation. 3. Any side effects from any medications? no 4. 4. Do you have any symptoms or problems not managed by your medications?  . Yes, she says that she would like to talk to her primary care doctor about some issues from the operation. 5. Any concerns about your health right now? No 6. Has your provider asked that you check blood pressure, blood sugar, or follow a special diet at home? . She checks her BP daily 7. Do you get any type of exercise regularly? .  Yes, she does walk in daily. Before her operation, she took a class two d days a week. 8. Can you think of a goal you would like to reach for your health?  . Yes, she would like to gain some weight 9. Do you have any problems getting your medications? No  10. Is there anything that you would like to discuss during the appointment? No  The patient was asked to please bring medications, blood pressure/ blood sugar log, and supplements to your appointment.  PCP : [name], MD  Allergies:   Allergies  Allergen Reactions  . Sulfamethoxazole-Trimethoprim Other (See Comments)    REACTION: unspecified  . Sulfur Hives and Itching    Medications: Outpatient Encounter Medications as of 04/01/2020  Medication Sig Note  . Acetaminophen (TYLENOL PO) Take 2 tablets by mouth as needed.   Marland Kitchen albuterol (PROVENTIL HFA;VENTOLIN HFA) 108 (90 Base) MCG/ACT inhaler Inhale 2 puffs into the lungs every 6 (six) hours as needed.   Marland Kitchen alendronate (FOSAMAX) 70 MG tablet Take 1 tablet (70 mg total) by mouth every 7 (seven) days. Take with a full glass of water on  an empty stomach.   . benazepril (LOTENSIN) 20 MG tablet TAKE 1 TABLET(20 MG) BY MOUTH DAILY   . budesonide-formoterol (SYMBICORT) 160-4.5 MCG/ACT inhaler Inhale 2 puffs into the lungs 2 (two) times daily. 03/25/2020: As needed  . carvedilol (COREG) 12.5 MG tablet TAKE 1 TABLET BY MOUTH TWICE DAILY   . carvedilol (COREG) 25 MG tablet TAKE 1 TABLET(25 MG) BY MOUTH TWICE DAILY WITH A MEAL   . cloNIDine (CATAPRES) 0.1 MG tablet TAKE 1 TABLET(0.1 MG) BY MOUTH TWICE DAILY   . conjugated estrogens (PREMARIN) vaginal cream Place 1 Applicatorful vaginally 2 (two) times a week.   . fluticasone (FLONASE) 50 MCG/ACT nasal spray Place 2 sprays into both nostrils as needed for allergies or rhinitis.   . furosemide (LASIX) 20 MG tablet TAKE 1 TABLET(20 MG) BY MOUTH DAILY   . omeprazole (PRILOSEC) 20 MG capsule Take 1 capsule (20 mg total) by mouth daily.   . ondansetron (ZOFRAN-ODT) 4 MG disintegrating tablet Take 1-2 tablets (4-8 mg total) by mouth every 8 (eight) hours as needed for nausea.   Marland Kitchen OVER THE COUNTER MEDICATION OTC eye drops    No facility-administered encounter medications on file as of 04/01/2020.    Current Diagnosis: Patient Active Problem List   Diagnosis Date Noted  . 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 Radiculopathy due to lumbar intervertebral disc disorder 03/27/2019  . Spinal stenosis of lumbar region  with neurogenic claudication 03/27/2019  . Diarrhea 11/27/2017  . Closed head injury 11/27/2015  . Unilateral headache 05/26/2015  . ARF (acute renal failure) ([place]) 03/12/2015  . Gallstones 03/12/2015  . Nausea without vomiting 12/10/2014  . Essential hypertension 05/29/2014  . 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 Agent resistant to multiple antibiotics 05/29/2013  . Low back pain radiating to right leg 01/16/2013  . Sinus problem 11/20/2012  . Chronic headaches morning 10/19/2012  . Low sodium levels  133 10/19/2012  . Leg pain,  posterior 08/16/2012  . Sciatic neuritis 08/16/2012  . Sleep disturbance, unspecified 08/16/2012  . Medication management 03/24/2012  . Preventative health care 09/24/2011  . Trigeminal neuralgia 09/24/2011  . Postmenopausal HRT (hormone replacement therapy) 09/24/2011  . Polycythemia 09/20/2011  . Morning headache 02/01/2011  . Hearing aid worn   . Abnormal blood finding   . LOCALIZED SUPERFICIAL SWELLING MASS OR LUMP 09/21/2009  . DYSPNEA 05/07/2008  . Hypnic headache 03/19/2008  . 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 Headache(784.0) 06/05/2007  . JAW PAIN 04/06/2007  . Osteoarthritis 04/06/2007  . ABNORMAL RESULT, FUNCTION STUDY, [place] 02/05/2007  . HYPERLIPIDEMIA 12/25/2006  . HYPERTENSION 12/25/2006    Goals Addressed   None     Follow-Up:  Pharmacist Review   Maia Breslow, [place] Assistant [phone]

## 2020-04-03 ENCOUNTER — Ambulatory Visit: Payer: Medicare Other | Admitting: Pharmacist

## 2020-04-03 DIAGNOSIS — I10 Essential (primary) hypertension: Secondary | ICD-10-CM

## 2020-04-03 DIAGNOSIS — G43109 Migraine with aura, not intractable, without status migrainosus: Secondary | ICD-10-CM

## 2020-04-03 NOTE — Chronic Care Management (AMB) (Signed)
Chronic Care Management Pharmacy  Name: [name]  MRN: [id number] DOB: [birth year]  Initial Planning Appointment: completed 04/01/20  Initial Questions: 1. Have you seen any other providers since your last visit? n/a 2. Any changes in your medicines or health? Yes - she is trying to recover from a recent operation  Chief Complaint/ HPI  [name],  79 y.o. , female presents for their Initial CCM visit with the clinical pharmacist via telephone due to COVID-19 Pandemic.  PCP : [name], MD  Their chronic conditions include: HTN, migraines, HLD, GERD, dyspnea, osteoporosis, pain  Office Visits: -03/25/20 Charlott Rakes, LPN: Patient presented for medicare annual wellness exam.   -03/17/20 [name], MD: Patient presented for follow up for chronic conditions. Patient received influenza vaccine. Recommended taking omeprazole 4-5 days per week. Follow up in 4-6 months.  -12/16/19 [name], MD: Patient presented for follow up for migraine. Prescribed rimegepant 75 mg PRN.   -10/24/19 Colin [name], DO: Patient presented for video visit for elevated BP. Recommended visit to ED.   Consult Visit: -03/05/20 [name], MD (cardiology): Patient presented for follow up for edema, HTN, and HLD. Given age and LDL will not start a statin. Follow up in 6 months.  -02/27/20 Erskine Emery, PA-C (ortho): Patient presented for evaluation of pain in right leg. X-ray showed no acute fractures. Recommend compression stockings.   -01/29/20 Erskine Emery, PA-C (ortho): Patient presented for evaluation of pain in left shoulder. MRI showed tear in rotator cuff. Recommend left shoulder arthroscopy with debridement and subacromial decompression in 2 months.  -12/19/19 Erskine Emery, PA-C (ortho): Patient presented for evaluation of pain in left shoulder. Patient has failed subacromial injections x 2 and PT. MRI ordered.  -12/04/19 [name], MD  (cardiology): Patient presented for initial evaluation for labile blood pressure. BP well controlled today, would not over treat due to intermittent hypotension. Follow up in 3 months.  -11/07/19 Patient presented to the ED for weakness.  -10/24/19 Patient presented to the ED with elevated BP.  Medications: Outpatient Encounter Medications as of 04/03/2020  Medication Sig Note  . Acetaminophen (TYLENOL PO) Take 2 tablets by mouth as needed.   Marland Kitchen albuterol (PROVENTIL HFA;VENTOLIN HFA) 108 (90 Base) MCG/ACT inhaler Inhale 2 puffs into the lungs every 6 (six) hours as needed.   Marland Kitchen alendronate (FOSAMAX) 70 MG tablet Take 1 tablet (70 mg total) by mouth every 7 (seven) days. Take with a full glass of water on an empty stomach.   . benazepril (LOTENSIN) 20 MG tablet TAKE 1 TABLET(20 MG) BY MOUTH DAILY   . butalbital-acetaminophen-caffeine (FIORICET WITH CODEINE) 50-325-40-30 MG capsule Take 1 capsule by mouth every 4 (four) hours as needed for headache.   . carvedilol (COREG) 25 MG tablet TAKE 1 TABLET(25 MG) BY MOUTH TWICE DAILY WITH A MEAL   . cloNIDine (CATAPRES) 0.1 MG tablet TAKE 1 TABLET(0.1 MG) BY MOUTH TWICE DAILY   . conjugated estrogens (PREMARIN) vaginal cream Place 1 Applicatorful vaginally 2 (two) times a week.   . fluticasone (FLONASE) 50 MCG/ACT nasal spray Place 2 sprays into both nostrils as needed for allergies or rhinitis.   . furosemide (LASIX) 20 MG tablet TAKE 1 TABLET(20 MG) BY MOUTH DAILY   . Multiple Vitamin (MULTIVITAMIN ADULT PO) Take 1 tablet by mouth.   Marland Kitchen omeprazole (PRILOSEC) 20 MG capsule Take 1 capsule (20 mg total) by mouth daily.   Marland Kitchen OVER THE COUNTER MEDICATION OTC eye drops   . traMADol Veatrice Bourbon)  50 MG tablet Take by mouth every 6 (six) hours as needed.   . ondansetron (ZOFRAN-ODT) 4 MG disintegrating tablet Take 1-2 tablets (4-8 mg total) by mouth every 8 (eight) hours as needed for nausea. (Patient not taking: Reported on 04/03/2020)   . [DISCONTINUED]  budesonide-formoterol (SYMBICORT) 160-4.5 MCG/ACT inhaler Inhale 2 puffs into the lungs 2 (two) times daily. 03/25/2020: As needed  . [DISCONTINUED] carvedilol (COREG) 12.5 MG tablet TAKE 1 TABLET BY MOUTH TWICE DAILY   . [DISCONTINUED] lamoTRIgine (LAMICTAL) 25 MG tablet Take 2 tablets by mouth in the morning and at bedtime.    No facility-administered encounter medications on file as of 04/03/2020.   She currently lives with her husband in a 6,000 foot house and is trying to move into something smaller. Her daughter lives in [place] with 2 grandchildren and tries to go there to visit. She used to travel more and go on cruises before COVID.   She cooks at home mostly and eats out 2-3 times a week. She eats a lot of chicken, beef a couple times a week, salads sometimes, always some kind of veggie and carbs with the meal. She reports her husband is "addicted to ice cream" but she doesn't eat dessert very often. She has been eating ice cream because she lost a lot of weight and has been eating 3 meals a day.  She hasn't been able to exercise since the surgery but does walk in the house some. She also has been using a cane since surgery.   Patient doesn't sleep very well and on some nights she is up several times but sometimes she sleeps through the night. She usually goes to bed around 11 and gets up before 8. She does take the ocasional accidental nap but has more trouble staying asleep.  Patient denies any problems with current medications.   Current Diagnosis/Assessment:  Goals Addressed            This Visit's Progress   . Pharmacy Care Plan       CARE PLAN ENTRY (see longitudinal plan of care for additional care plan information)  Current Barriers:  . Chronic Disease Management support, education, and care coordination needs related to Hypertension, Hyperlipidemia, GERD, Osteoporosis, and pain and trigeminal neuralgia/migraines   Hypertension BP Readings from Last 3 Encounters:    03/17/20 (!) 144/84  03/05/20 (!) 152/90  12/16/19 (!) 142/88   . Pharmacist Clinical Goal(s): o Over the next 120 days, patient will work with PharmD and providers to achieve BP goal <140/90 . Current regimen:  . Benazepril 20 mg 1 tablet daily  . Carvedilol 25 mg 1 tablet BID  . Clonidine 0.1 mg 1 tablet BID . Furosemide 20 mg 1 tablet daily . Interventions: o Discussed DASH eating plan recommendations: . Emphasizes vegetables, fruits, and whole-grains . Includes fat-free or low-fat dairy products, fish, poultry, beans, nuts, and vegetable oils . Limits foods that are high in saturated fat. These foods include fatty meats, full-fat dairy products, and tropical oils such as coconut, palm kernel, and palm oils. . Limits sugar-sweetened beverages and sweets . Limiting sodium intake to < 1500 mg/day . Patient self care activities - Over the next 120 days, patient will: o Check blood pressure daily, document, and provide at future appointments o Ensure daily salt intake < 2300 mg/day  Hyperlipidemia Lab Results  Component Value Date/Time   LDLCALC 145 (H) 05/27/2019 10:45 AM   LDLDIRECT 135.9 10/16/2012 08:40 AM   . Pharmacist Clinical Goal(s):  o Over the next 120 days, patient will work with PharmD and providers to achieve LDL goal < 100 . Current regimen:  o No medications . Interventions: o Discussed lowering cholesterol through diet by: Marland Kitchen Limiting foods with cholesterol such as liver and other organ meats, egg yolks, shrimp, and whole milk dairy products . Avoiding saturated fats and trans fats and incorporating healthier fats, such as lean meat, nuts, and unsaturated oils like canola and olive oils . Eating foods with soluble fiber such as whole-grain cereals such as oatmeal and oat bran, fruits such as apples, bananas, oranges, pears, and prunes, legumes such as kidney beans, lentils, chick peas, black-eyed peas, and lima beans, and green leafy vegetables . Limiting alcohol  intake . Patient self care activities - Over the next 120 days, patient will: o Continue working on dietary and lifestyle modifications to lower cholesterol  GERD . Pharmacist Clinical Goal(s): o Over the next 120 days, patient will work with PharmD and providers to manage symptoms of heartburn . Current regimen:  o Omeprazole 20 mg 1 capsule daily (4-5 times week)  . Interventions: o Discussed non-pharmacologic management of symptoms such as elevating the head of your bed, avoiding eating 2-3 hours before bed, avoiding triggering foods such as acidic, spicy, or fatty foods, eating smaller meals, and wearing clothes that are loose around the waist  . Patient self care activities - Over the next 120 days, patient will: o Decrease to omeprazole 4 days per week  Osteoporosis . Pharmacist Clinical Goal(s) o Over the next 120 days, patient will work with PharmD and providers to improve bone density . Current regimen:  . Alendronate 70 mg 1 tablet weekly  . Vitamin D . Calcium 1200 mg  . Interventions: o Discussed recommend [phone] units of vitamin D daily and 1200 mg of calcium daily from dietary and supplemental sources.  o Recommended setting an alarm or reminder on phone to remember to take alendronate on the same day each week . Patient self care activities - Over the next 120 days, patient will: o Patient will make sure she is getting the recommended amounts of vitamin D and calcium each day  Pain . Pharmacist Clinical Goal(s) o Over the next 120 days, patient will work with PharmD and providers to manage symptoms of pain . Current regimen:  . Tylenol 500 mg 2 tablets as needed . Tramadol 50 mg 1 tablet as needed . Interventions: o Discussed maximum daily recommended dose of Tylenol of 3,000 mg/day . Patient self care activities - Over the next 120 days, patient will: o Continue current medications  Trigeminal neuralgia/migraines . Pharmacist Clinical Goal(s) o Over the next  120 days, patient will work with PharmD and providers to manage symptoms of migraines and trigeminal neuralgia . Current regimen:  . Fioricet with codeine 50-325-40-30 mg 1 capsule as needed . Lamotrigine 50 mg twice daily . Interventions: o Discussed reaching out to Dr. Jaynee Eagles about increasing dose of lamotrigine . Patient self care activities - Over the next 120 days, patient will: o Reach out to Dr. Jaynee Eagles about increasing dose of lamotrigine  Medication management . Pharmacist Clinical Goal(s): o Over the next 120 days, patient will work with PharmD and providers to maintain optimal medication adherence . Current pharmacy: [facility] . Interventions o Comprehensive medication review performed. o Continue current medication management strategy . Patient self care activities - Over the next 120 days, patient will: o Take medications as prescribed o Report any questions or concerns to  PharmD and/or provider(s)  Initial goal documentation       SDOH Interventions     Most Recent Value  SDOH Interventions  Financial Strain Interventions Intervention Not Indicated  Transportation Interventions Intervention Not Indicated      Hypertension   BP goal is:  <140/90  Office blood pressures are  BP Readings from Last 3 Encounters:  03/17/20 (!) 144/84  03/05/20 (!) 152/90  12/16/19 (!) 142/88   Patient checks BP at home daily Patient home BP readings are ranging: 135-159/70-90s  Patient has failed these meds in the past:  Patient is currently uncontrolled on the following medications:  . Benazepril 20 mg 1 tablet daily - in PM . Carvedilol 25 mg 1 tablet BID  . Clonidine 0.1 mg 1 tablet BID . Furosemide 20 mg 1 tablet daily - in AM  We discussed diet and exercise extensively -DASH eating plan recommendations: . Emphasizes vegetables, fruits, and whole-grains . Includes fat-free or low-fat dairy products, fish, poultry, beans, nuts, and vegetable oils . Limits foods that  are high in saturated fat. These foods include fatty meats, full-fat dairy products, and tropical oils such as coconut, palm kernel, and palm oils. . Limits sugar-sweetened beverages and sweets . Limiting sodium intake to < 1500 mg/day -Diet: some canned foods (chooses low sodium), eats some frozen foods  Plan Plan for BP assessment in 1-2 months. Can consider dose increase of benazepril. Continue current medications   Migraines/trigeminal neuralgia   Patient has failed these meds in past: none Patient is currently controlled on the following medications:  . Fioricet with codeine 50-325-40-30 mg 1 capsule PRN . Lamotrigine   We discussed:  Patient takes Fioricet about once a month; patient current states lamotrigine is helping some but not completely  Plan Patient will reach out to Dr. Jaynee Eagles about increasing lamotrigine dose. Continue current medications   Hyperlipidemia   LDL goal < 100  Last lipids Lab Results  Component Value Date   CHOL 279 (H) 05/27/2019   HDL 118.80 05/27/2019   LDLCALC 145 (H) 05/27/2019   LDLDIRECT 135.9 10/16/2012   TRIG 78.0 05/27/2019   CHOLHDL 2 05/27/2019   Hepatic Function Latest Ref Rng & Units 08/29/2019 05/27/2019 05/25/2018  Total Protein 6.0 - 8.5 g/dL 5.9(L) 6.4 6.0  Albumin 3.7 - 4.7 g/dL 4.1 4.1 4.1  AST 0 - 40 IU/L 25 24 21   ALT 0 - 32 IU/L 18 23 19   Alk Phosphatase 39 - 117 IU/L 87 99 104  Total Bilirubin 0.0 - 1.2 mg/dL 0.3 0.8 0.7  Bilirubin, Direct 0.0 - 0.3 mg/dL - 0.2 0.2     The ASCVD Risk score ([place]., et al., 2013) failed to calculate for the following reasons:   The 2013 ASCVD risk score is only valid for ages 75 to 79   Patient has failed these meds in past: none Patient is currently uncontrolled on the following medications:  . No medications  We discussed:  diet and exercise extensively  -Lowering cholesterol through diet by: Marland Kitchen Limiting foods with cholesterol such as liver and other organ meats, egg yolks,  shrimp, and whole milk dairy products . Avoiding saturated fats and trans fats and incorporating healthier fats, such as lean meat, nuts, and unsaturated oils like canola and olive oils . Eating foods with soluble fiber such as whole-grain cereals such as oatmeal and oat bran, fruits such as apples, bananas, oranges, pears, and prunes, legumes such as kidney beans, lentils, chick peas, black-eyed peas,  and lima beans, and green leafy vegetables . Limiting alcohol intake  Plan  Continue control with diet and exercise   GERD   Patient has failed these meds in past: none Patient is currently controlled on the following medications:  . Omeprazole 20 mg 1 capsule daily (4-5 times week)   We discussed: Non-pharmacologic management of symptoms such as elevating the head of your bed, avoiding eating 2-3 hours before bed, avoiding triggering foods such as acidic, spicy, or fatty foods, eating smaller meals, and wearing clothes that are loose around the waist; can take Tums PRN for breakthrough heartburn   Plan Take omeprazole 4 days a week.  Continue current medications    Dyspnea   Patient has failed these meds in past: Symbicort (no longer needed) Patient is currently controlled on the following medications:  . Albuterol 90 mcg/act 2 puffs every 6 hours PRN  Using maintenance inhaler regularly? No Frequency of rescue inhaler use:  prn  We discussed:  proper inhaler technique  Plan  Continue current medications  Osteoporosis   Last DEXA Scan: 05/28/2018  T-Score femoral neck: R -1.3, L -2.0  T-Score total hip: n/a  T-Score lumbar spine: -0.9  T-Score forearm radius: n/a  10-year probability of major osteoporotic fracture: 21.4%  10-year probability of hip fracture: 6.0%  VITD  Date Value Ref Range Status  05/25/2018 19.97 (L) 30.00 - 100.00 ng/mL Final     Patient is a candidate for pharmacologic treatment due to history of vertebral fracture, T-Score -1.0 to -2.5 and  10-year risk of major osteoporotic fracture > 20% and T-Score -1.0 to -2.5 and 10-year risk of hip fracture > 3%  Patient has failed these meds in past: none Patient is currently controlled on the following medications:  . Alendronate 70 mg 1 tablet weekly - sometimes takes on Tues and Mon - put them on top of coffee pot . Vitamin D . Calcium 1200 mg   We discussed:  Recommend [phone] units of vitamin D daily. Recommend 1200 mg of calcium daily from dietary and supplemental sources. Counseled on oral bisphosphonate administration: take in the morning, 30 minutes prior to food with 6-8 oz of water. Do not lie down for at least 30 minutes after taking. Recommend weight-bearing and muscle strengthening exercises for building and maintaining bone density.  Plan Patient will make sure to supplement with 1000 units of vitamin D daily. Recommend repeat vitamin D level.  Continue current medications   Pain   Patient has failed these meds in past: none Patient is currently controlled on the following medications:  . Tylenol 500 mg 2 tablets PRN . Tramadol 50 mg 1 tablet PRN  We discussed:  Maximum daily recommended dose of Tylenol of 3,000 mg/day  Plan  Continue current medications   Miscellaneous   Patient is currently on the following medications:  . Ondansetron 4 mg ODT PRN . Premarin vaginal cream 1 applicatorful twice weekly . Systane eye drops PRN . Flonase 2 sprays as needed - gets more headaches during allergy season . Vitamin B (can't remember dose)  Plan  Continue current medications  Vaccines   Reviewed and discussed patient's vaccination history.    Immunization History  Administered Date(s) Administered  . Fluad Quad(high Dose 65+) 02/18/2019, 03/17/2020  . Influenza Split 03/10/2011, 02/02/2012  . Influenza Whole 02/18/2008, 03/03/2010  . Influenza, High Dose Seasonal PF 03/04/2014, 02/01/2016, 02/22/2017, 03/20/2018  . Influenza,inj,Quad PF,6+ Mos 03/05/2013   . PFIZER SARS-COV-2 Vaccination 06/01/2019, 06/22/2019  . Pneumococcal  Conjugate-13 11/13/2013  . Pneumococcal Polysaccharide-23 03/23/2015  . Td 05/23/1996  . Tdap 09/19/2011  . Zoster 09/19/2011  . Zoster Recombinat (Shingrix) 04/15/2020   Patient received first dose of Shingrix at pharmacy [facility]) - added to immunization history.   Plan  Recommended patient receive 2nd dose of shingles vaccine at pharmacy.  Medication Management   Pt uses [facility] pharmacy for all medications (not very attentive, technicians) Uses pill box? No - has them together in the kitchen - leaves the container of pills on counter in evening  Pt endorses 100% compliance  We discussed: Current pharmacy is preferred with insurance plan and patient is satisfied with pharmacy services  Plan  Continue current medication management strategy   Follow up: 4 month phone visit   [name], PharmD Clinical Pharmacist [place] [facility] [place]

## 2020-04-06 ENCOUNTER — Other Ambulatory Visit: Payer: Self-pay | Admitting: Neurology

## 2020-04-06 MED ORDER — LAMOTRIGINE 25 MG PO TABS
75.0000 mg | ORAL_TABLET | Freq: Two times a day (BID) | ORAL | 6 refills | Status: DC
Start: 2020-04-06 — End: 2020-05-11

## 2020-04-06 NOTE — Telephone Encounter (Signed)
Patient sent mychart message to Dr Jaynee Eagles regarding dosage. Hold to refill until MD advises if dose should change.

## 2020-04-14 NOTE — Telephone Encounter (Signed)
I see that you have seen  The  Speciality teams neuro and ortho  and are trying other modalities . For pain.  Hope it is helpful . Keep [place] updated

## 2020-04-15 ENCOUNTER — Inpatient Hospital Stay: Payer: Medicare Other | Admitting: Physician Assistant

## 2020-04-21 NOTE — Patient Instructions (Addendum)
Hi [name],  It was lovely to get to meet you over the phone! Below is a summary of some of the topics we discussed. Additionally, I wanted to remind you to get your second dose of Shingrix at your pharmacy as you told me you had already received the first one. Continue checking your blood pressure as you have been and I will plan to have my assistant Mimi reach out in a couple of months to check in on that.  Please give me a call if you have questions or need anything before our follow up in 4 months!  Best, [name]  [name], PharmD [facility] Clinical Pharmacist [place] [facility] [place]   Visit Information  Goals Addressed            This Visit's Progress   . Pharmacy Care Plan       CARE PLAN ENTRY (see longitudinal plan of care for additional care plan information)  Current Barriers:  . Chronic Disease Management support, education, and care coordination needs related to Hypertension, Hyperlipidemia, GERD, Osteoporosis, and pain and trigeminal neuralgia/migraines   Hypertension BP Readings from Last 3 Encounters:  03/17/20 (!) 144/84  03/05/20 (!) 152/90  12/16/19 (!) 142/88   . Pharmacist Clinical Goal(s): o Over the next 120 days, patient will work with PharmD and providers to achieve BP goal <140/90 . Current regimen:  . Benazepril 20 mg 1 tablet daily  . Carvedilol 25 mg 1 tablet BID  . Clonidine 0.1 mg 1 tablet BID . Furosemide 20 mg 1 tablet daily . Interventions: o Discussed DASH eating plan recommendations: . Emphasizes vegetables, fruits, and whole-grains . Includes fat-free or low-fat dairy products, fish, poultry, beans, nuts, and vegetable oils . Limits foods that are high in saturated fat. These foods include fatty meats, full-fat dairy products, and tropical oils such as coconut, palm kernel, and palm oils. . Limits sugar-sweetened beverages and sweets . Limiting sodium intake to < 1500 mg/day . Patient self care activities -  Over the next 120 days, patient will: o Check blood pressure daily, document, and provide at future appointments o Ensure daily salt intake < 2300 mg/day  Hyperlipidemia Lab Results  Component Value Date/Time   LDLCALC 145 (H) 05/27/2019 10:45 AM   LDLDIRECT 135.9 10/16/2012 08:40 AM   . Pharmacist Clinical Goal(s): o Over the next 120 days, patient will work with PharmD and providers to achieve LDL goal < 100 . Current regimen:  o No medications . Interventions: o Discussed lowering cholesterol through diet by: Marland Kitchen Limiting foods with cholesterol such as liver and other organ meats, egg yolks, shrimp, and whole milk dairy products . Avoiding saturated fats and trans fats and incorporating healthier fats, such as lean meat, nuts, and unsaturated oils like canola and olive oils . Eating foods with soluble fiber such as whole-grain cereals such as oatmeal and oat bran, fruits such as apples, bananas, oranges, pears, and prunes, legumes such as kidney beans, lentils, chick peas, black-eyed peas, and lima beans, and green leafy vegetables . Limiting alcohol intake . Patient self care activities - Over the next 120 days, patient will: o Continue working on dietary and lifestyle modifications to lower cholesterol  GERD . Pharmacist Clinical Goal(s): o Over the next 120 days, patient will work with PharmD and providers to manage symptoms of heartburn . Current regimen:  o Omeprazole 20 mg 1 capsule daily (4-5 times week)  . Interventions: o Discussed non-pharmacologic management of symptoms such as elevating the head  of your bed, avoiding eating 2-3 hours before bed, avoiding triggering foods such as acidic, spicy, or fatty foods, eating smaller meals, and wearing clothes that are loose around the waist  . Patient self care activities - Over the next 120 days, patient will: o Decrease to omeprazole 4 days per week  Osteoporosis . Pharmacist Clinical Goal(s) o Over the next 120 days, patient  will work with PharmD and providers to improve bone density . Current regimen:  . Alendronate 70 mg 1 tablet weekly  . Vitamin D . Calcium 1200 mg  . Interventions: o Discussed recommend [phone] units of vitamin D daily and 1200 mg of calcium daily from dietary and supplemental sources.  o Recommended setting an alarm or reminder on phone to remember to take alendronate on the same day each week . Patient self care activities - Over the next 120 days, patient will: o Patient will make sure she is getting the recommended amounts of vitamin D and calcium each day  Pain . Pharmacist Clinical Goal(s) o Over the next 120 days, patient will work with PharmD and providers to manage symptoms of pain . Current regimen:  . Tylenol 500 mg 2 tablets as needed . Tramadol 50 mg 1 tablet as needed . Interventions: o Discussed maximum daily recommended dose of Tylenol of 3,000 mg/day . Patient self care activities - Over the next 120 days, patient will: o Continue current medications  Trigeminal neuralgia/migraines . Pharmacist Clinical Goal(s) o Over the next 120 days, patient will work with PharmD and providers to manage symptoms of migraines and trigeminal neuralgia . Current regimen:  . Fioricet with codeine 50-325-40-30 mg 1 capsule as needed . Lamotrigine 50 mg twice daily . Interventions: o Discussed reaching out to Dr. Jaynee Eagles about increasing dose of lamotrigine . Patient self care activities - Over the next 120 days, patient will: o Reach out to Dr. Jaynee Eagles about increasing dose of lamotrigine  Medication management . Pharmacist Clinical Goal(s): o Over the next 120 days, patient will work with PharmD and providers to maintain optimal medication adherence . Current pharmacy: [facility] . Interventions o Comprehensive medication review performed. o Continue current medication management strategy . Patient self care activities - Over the next 120 days, patient will: o Take medications as  prescribed o Report any questions or concerns to PharmD and/or provider(s)  Initial goal documentation        [name] was given information about Chronic Care Management services today including:  1. CCM service includes personalized support from designated clinical staff supervised by her physician, including individualized plan of care and coordination with other care providers 2. 24/7 contact phone numbers for assistance for urgent and routine care needs. 3. Standard insurance, coinsurance, copays and deductibles apply for chronic care management only during months in which we provide at least 20 minutes of these services. Most insurances cover these services at 100%, however patients may be responsible for any copay, coinsurance and/or deductible if applicable. This service may help you avoid the need for more expensive face-to-face services. 4. Only one practitioner may furnish and bill the service in a calendar month. 5. The patient may stop CCM services at any time (effective at the end of the month) by phone call to the office staff.  Patient agreed to services and verbal consent obtained.   The patient verbalized understanding of instructions, educational materials, and care plan provided today and agreed to receive a mailed copy of patient instructions, educational materials, and care plan.  Telephone follow up appointment with pharmacy team member scheduled for: 4 months    Eating Plan for Osteoporosis Osteoporosis causes your bones to become weak and brittle. This puts you at greater risk for bone breaks (fractures) from small bumps or falls. Making changes to your diet and increasing your physical activity can help strengthen your bones and improve your overall health. Calcium and vitamin D are nutrients that play an important role in bone health. Vitamin D helps your body use calcium and strengthen bones. Therefore, it is important to get enough calcium and vitamin D as  part of your eating plan for osteoporosis. What are tips for following this plan? Reading food labels  Try to get at least 1,000 milligrams (mg) of calcium each day.  Look for foods that have at least 50 mg of calcium per serving.  Talk with your health care provider about taking a calcium supplement if you do not get enough calcium from food.  Do not have more than 2,500 mg of calcium each day. This is the upper limit for food and nutritional supplements combined. Too much calcium may cause constipation and prevent you from absorbing other important nutrients.  Choose foods that contain vitamin D.  Take a daily vitamin supplement that contains 800-1,000 international units (IU) of vitamin D. The amount may be different depending on your age, body weight, ethnicity, and where you live. Talk with your dietitian or health care provider about how much vitamin D is right for you.  Avoid foods that have more than 300 mg of sodium per serving. Too much sodium can cause your body to lose calcium.  Talk with your dietitian or health care provider about how much sodium you are allowed each day. Shopping  Do not buy foods with added salt, including: ? Salted snacks. ? Angie Fava. ? Canned soups. ? Canned meats. ? Processed meats, such as bacon or cold cuts. ? Smoked fish. Meal planning  Eat balanced meals that contain protein foods, fruits and vegetables, and foods rich in calcium and vitamin D.  Eat at least 5 servings of fruits and vegetables each day.  Eat 5-6 oz. of lean meat, poultry, fish, eggs, or beans each day. Lifestyle  Do not use any products that contain nicotine or tobacco, such as cigarettes and e-cigarettes. If you need help quitting, ask your health care provider.  If your health care provider recommends that you lose weight: ? Work with a dietitian to develop an eating plan that will help you reach your desired weight goal. ? Exercise for at least 30 minutes a day, 5 or  more days a week, or as told by your health care provider.  Work with a physical therapist to develop an exercise plan that includes flexibility, balance, and strength exercises.  If you drink alcohol, limit how much you have. This means: ? 0-1 drink a day for women. ? 0-2 drinks a day for men. ? Be aware of how much alcohol is in your drink. In the [place] one drink equals one typical bottle of beer (12 oz), one-half glass of wine (5 oz), or one shot of hard liquor (1 oz). What foods should I eat? Foods high in calcium   Yogurt. Yogurt with fruit.  Milk. Evaporated skim milk. Dry milk powder.  Calcium-fortified orange juice.  Parmesan cheese. Part-skim ricotta cheese. Natural hard cheese. Cream cheese. Cottage cheese.  Canned sardines. Canned salmon.  Calcium-treated tofu. Calcium-fortified cereal bar. Calcium-fortified cereal. Calcium-fortified graham crackers.  Cooked collard  greens. Turnip greens. Broccoli. Kale.  Almonds.  White beans.  Corn tortilla. Foods high in vitamin D  Cod liver oil. Fatty fish, such as tuna, mackerel, and salmon.  Milk. Fortified soy milk. Fortified fruit juice.  Yogurt. Margarine.  Egg yolks. Foods high in protein  Beef. Lamb. Pork tenderloin.  Chicken breast.  Tuna (canned). Fish fillet.  Tofu.  Soy beans (cooked). Soy patty. Beans (canned or cooked).  Cottage cheese.  Yogurt.  Peanut butter.  Pumpkin seeds. Nuts. Sunflower seeds.  Hard cheese.  Milk or other milk products, such as soy milk. The items listed above may not be a complete list of foods and beverages you can eat. Contact a dietitian for more options. Summary  Calcium and vitamin D are nutrients that play an important role in bone health and are an important part of your eating plan for osteoporosis.  Eat balanced meals that contain protein foods, fruits and vegetables, and foods rich in calcium and vitamin D.  Avoid foods that have more than 300 mg of  sodium per serving. Too much sodium can cause your body to lose calcium.  Exercise is an important part of prevention and treatment of osteoporosis. Aim for at least 30 minutes a day, 5 days a week. This information is not intended to replace advice given to you by your health care provider. Make sure you discuss any questions you have with your health care provider. Document Revised: 07/17/2017 Document Reviewed: 07/17/2017 Elsevier Patient Education  2020 [employer].

## 2020-04-21 NOTE — Telephone Encounter (Signed)
See previous response .  Need to settle this before  The weekend  As I will be out of office for 2 weeks  After that .

## 2020-04-21 NOTE — Telephone Encounter (Signed)
Sorry you are still   Feeling so badly   Usually the  Treating team ie  Current back and rehab team should be help ordering this  IE  surgeon and rehab since they are best for managing the pain meds  .  I would not be able to do medication orders .  Only sign a form that I agree you need  More help to rehab.  Please advise what is needed for a referral and what is on the form

## 2020-04-22 ENCOUNTER — Encounter: Payer: Self-pay | Admitting: Internal Medicine

## 2020-04-22 ENCOUNTER — Telehealth (INDEPENDENT_AMBULATORY_CARE_PROVIDER_SITE_OTHER): Payer: Medicare Other | Admitting: Internal Medicine

## 2020-04-22 ENCOUNTER — Telehealth: Payer: Self-pay | Admitting: Internal Medicine

## 2020-04-22 DIAGNOSIS — I10 Essential (primary) hypertension: Secondary | ICD-10-CM

## 2020-04-22 DIAGNOSIS — M5116 Intervertebral disc disorders with radiculopathy, lumbar region: Secondary | ICD-10-CM

## 2020-04-22 DIAGNOSIS — Z9181 History of falling: Secondary | ICD-10-CM

## 2020-04-22 DIAGNOSIS — G500 Trigeminal neuralgia: Secondary | ICD-10-CM | POA: Diagnosis not present

## 2020-04-22 DIAGNOSIS — Z79899 Other long term (current) drug therapy: Secondary | ICD-10-CM | POA: Diagnosis not present

## 2020-04-22 DIAGNOSIS — M48062 Spinal stenosis, lumbar region with neurogenic claudication: Secondary | ICD-10-CM

## 2020-04-22 DIAGNOSIS — Z9889 Other specified postprocedural states: Secondary | ICD-10-CM

## 2020-04-22 MED ORDER — BUTALBITAL-APAP-CAFF-COD 50-325-40-30 MG PO CAPS: 1.0000 | ORAL_CAPSULE | ORAL | 0 refills | Status: DC | PRN

## 2020-04-22 NOTE — Telephone Encounter (Signed)
[name] is calling and wanted to see if Dr. Regis Bill can fill out a FL2 form that she can come pick up. Pt is requesting to go to [facility] for Rehab, please advise. CB is [phone]

## 2020-04-22 NOTE — Progress Notes (Signed)
Virtual Visit via Telephone Note  I connected with@ on 04/22/20 at 11:30 AM EST by telephone and verified that I am speaking with the correct person using two identifiers.   I discussed the limitations, risks, security and privacy concerns of performing an evaluation and management service by telephone and the limited availability of in person appointments. tThere may be a patient responsible charge related to this service. The patient expressed understanding and agreed to proceed.  Location patient: home Location provider: work  office Participants present for the call: patient, provider Patient did not have a visit in the prior 7 days to address this/these issue(s).   History of Present Illness: [name]   presents for telephone visit to discuss referral to [facility] rehab facility.  She has had 2 surgeries Dr. Vertell Limber on her back and has pretended continued radicular symptoms and pain that is been a problem and would like to get entry to [facility] for rehab and help.  In regard to trigeminal neuralgia her Lamictal has been increased by Dr. Lavell Anchors to 75 twice daily and the pain is tolerable at this time  Blood pressure on medicine mostly controlled cardiology felt current regimen was adequate and a proper.  At this time because pain medicine either is not helpful or causes side effects as she was being given butalbital her migraine rescue but was on car out she is back to Tylenol and Aleve.  Recently she fell into the walker on the bed but not to the floor and may have flared up her back a bit.  No major change in hearing vision.  The entry people at [facility] should be calling primary physician requested.    Observations/Objective: Patient sounds personable and well on the phone. I do not appreciate any SOB. Speech and thought processing are grossly intact. Patient reported vitals: Lab Results  Component Value Date   WBC 5.1 11/07/2019   HGB 14.5 11/07/2019    HCT 45.5 11/07/2019   PLT 167 11/07/2019   GLUCOSE 101 (H) 11/07/2019   CHOL 279 (H) 05/27/2019   TRIG 78.0 05/27/2019   HDL 118.80 05/27/2019   LDLDIRECT 135.9 10/16/2012   LDLCALC 145 (H) 05/27/2019   ALT 18 08/29/2019   AST 25 08/29/2019   NA 142 11/07/2019   K 3.8 11/07/2019   CL 106 11/07/2019   CREATININE 0.90 11/07/2019   BUN 19 11/07/2019   CO2 27 11/07/2019   TSH 0.54 05/27/2019   INR 1.23 03/12/2015   HGBA1C 5.5 05/25/2018    Assessment and Plan: Spinal stenosis of lumbar region with neurogenic claudication  Radiculopathy due to lumbar intervertebral disc disorder  Medication management  Trigeminal neuralgia  History of back surgery - x 2  Essential hypertension  History of falling    Follow Up Instructions:  will be willing to help in any way possible specialist would have to do pain management as well as neurology management of her DGN. Expectant management. See messages. [id number] [id number] [id number] 11-20 [id number] 21-30 I did not refer this patient for an OV in the next 24 hours for this/these issue(s).  I discussed the assessment and treatment plan with the patient. The patient was provided an opportunity to ask questions and answered. The patient agreed with the plan and demonstrated an understanding of the instructions.   The patient was advised to call back or seek an in-person evaluation if the symptoms worsen or if the condition fails to improve as anticipated.  I provided 18 minutes of  non-face-to-face time during this encounter. Return for tbd.  [name], MD

## 2020-04-24 NOTE — Telephone Encounter (Signed)
I did not have an FL 2  so I printed one from on line.  Pain management to be done by the specialist.  Otherwise need staff to call her and  help finish filling out form   copy and get to correct persons.  I will be out of the office for the last next aches.

## 2020-04-27 ENCOUNTER — Telehealth: Payer: Self-pay | Admitting: Neurology

## 2020-04-27 NOTE — Telephone Encounter (Signed)
We have tried to reach the other pt twice. I sent the phone note to you as FYI.

## 2020-04-27 NOTE — Telephone Encounter (Signed)
See other encounters

## 2020-04-27 NOTE — Telephone Encounter (Signed)
[name], I held a slot with [name] for this Thursday at 9 AM. Do you mind calling pt and offering an appt per Dr Jaynee Eagles? Thanks!

## 2020-04-27 NOTE — Telephone Encounter (Signed)
Can we offer the other patient the 12/8?

## 2020-04-27 NOTE — Telephone Encounter (Signed)
We now have an opening with [name] on Wed 12/8 @ 2:00 pm.

## 2020-04-27 NOTE — Telephone Encounter (Signed)
Can you see if [name] or [name] have any availability soon and schedule patient or block the appointment and ask Tori to call patient? thanks

## 2020-04-28 NOTE — Telephone Encounter (Signed)
LVM for patient to call back to give information for FL2 to be complete

## 2020-04-30 NOTE — Telephone Encounter (Signed)
LVM for [name], [facility] is ready for pickup .  Will leave form at front desk

## 2020-04-30 NOTE — Telephone Encounter (Signed)
Spoke with patient to fill out form. Will contact [employer] for pickup

## 2020-05-06 DIAGNOSIS — M7138 Other bursal cyst, other site: Secondary | ICD-10-CM | POA: Diagnosis not present

## 2020-05-06 DIAGNOSIS — Z9889 Other specified postprocedural states: Secondary | ICD-10-CM | POA: Diagnosis not present

## 2020-05-06 DIAGNOSIS — M5416 Radiculopathy, lumbar region: Secondary | ICD-10-CM | POA: Diagnosis not present

## 2020-05-06 DIAGNOSIS — I10 Essential (primary) hypertension: Secondary | ICD-10-CM | POA: Diagnosis not present

## 2020-05-07 DIAGNOSIS — Z9889 Other specified postprocedural states: Secondary | ICD-10-CM | POA: Insufficient documentation

## 2020-05-11 ENCOUNTER — Ambulatory Visit: Payer: Medicare Other | Admitting: Neurology

## 2020-05-11 ENCOUNTER — Encounter: Payer: Self-pay | Admitting: Neurology

## 2020-05-11 VITALS — BP 184/95 | HR 71 | Ht 66.0 in | Wt 122.0 lb

## 2020-05-11 DIAGNOSIS — Z79899 Other long term (current) drug therapy: Secondary | ICD-10-CM

## 2020-05-11 MED ORDER — LAMOTRIGINE 100 MG PO TABS
100.0000 mg | ORAL_TABLET | Freq: Two times a day (BID) | ORAL | 3 refills | Status: DC
Start: 2020-05-11 — End: 2020-06-11

## 2020-05-11 NOTE — Progress Notes (Signed)
[facility]    Provider:  Dr Jaynee Eagles Requesting Provider: Erline Levine, MD Primary Care Provider:  [name], MD  CC: trigeminal neuralgia  Interval history 05/11/2020: She is taking more Lamictal. She had the procedure with Dr. Vertell Limber, her right face was numb for some time, there ois discomfort now, once in a while she gets the lightning strike, the procedure made a huge difference. She is on 138m twice a day of the Lamictal.   HPI:  [name] a 79y.o. female here as requested by [name], WStandley Brooking MD for Trigeminal neuralgia. 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 EMart Piggsin the past, remote kidney infection, hypertension, hyperlipidemia, hard of hearing, fatty liver, anxiety and depression.  I reviewed Dr. SMelven Sartoriusnotes, she was seen there for evaluation of bilateral leg pain, increased leg pain left greater than right 6 months, weakness in both legs, she had epidural steroid injections x3, most recently left L4-L5 in November with no relief, numerous medications trialed most recently Cymbalta, Tylenol, Motrin.  Patient is on carbamazepine 500 mg twice daily for trigeminal neuralgia for which she [facility],  She was referred here for trigeminal neuralgia, I reviewed her neurologic examination which included normal strength, normal tone, normal reflexes, normal cranial nerve exams, Lhermitte's negative, Romberg negative no pronator drift, Hoffmann's normal, no clonus, diagnosed with lumbar stenosis with synovial cyst causing severe low back pain and bilateral lower extremity pain with left greater than right leg syndromes, L4-L5 laminectomy with resection of synovial cyst to be performed, referred over here for trigeminal neuralgia.  Reviewed Dr. PVelora Mediatenotes, patient sodium was affected on Tegretol.   [facility], she was told hopefully the surgery would help but since then she had been increasing the medication and increased to 12059ma day and was decreased due to blood work and side effects, and now she has stopped the carbamazepine. She was getting side effects from the carbamazepine but it was helping. Dr. StVertell Limberlaced her on Oxcarbazepine(trileptal) and now she is on cymbalta, she started the Trileptal a few weeks ago. She was started on 30035mnd just a few days ago she was increased to 600m50mice a day(1200mg24may). She is not taking the cymbalta, was likely prescribed for low back pain (Dr. NewtoErnestina Patchese ain starts on the right and shoots down her jaw, chewing, talking makes it worse, severe, shooting, same pain she has always experienced. The pain is severe today.   Review of Systems: Patient complains of symptoms per HPI as well as the following symptoms: hyponatremia. Pertinent negatives and positives per HPI. All others negative.   Social History   Socioeconomic History  . Marital status: Married    Spouse name: Not on file  . Number of children: 2  . Years of education: Not on file  . Highest education level: Not on file  Occupational History    Comment: retired real Forensic psychologistacco Use  . Smoking status: Never Smoker  . Smokeless tobacco: Never Used  Vaping Use  . Vaping Use: Never used  Substance and Sexual Activity  . Alcohol use: Yes    Alcohol/week: 2.0 standard drinks    Types: 2 Glasses of wine per week    Comment: occ wine  . Drug  use: No  . Sexual activity: Not on file  Other Topics Concern  . Not on file  Social History Narrative   Married   HH of 2-3 (god daughter)   Pets 2 dogs   Non smoker    Child is a physician   G2P2      Caffeine: 2 cups/day   Social Determinants of Health   Financial Resource Strain: Low Risk    . Difficulty of Paying Living Expenses: Not hard at all  Food Insecurity: No Food Insecurity  . Worried About Charity fundraiser in the Last Year: Never true  . Ran Out of Food in the Last Year: Never true  Transportation Needs: No Transportation Needs  . Lack of Transportation (Medical): No  . Lack of Transportation (Non-Medical): No  Physical Activity: Inactive  . Days of Exercise per Week: 0 days  . Minutes of Exercise per Session: 0 min  Stress: No Stress Concern Present  . Feeling of Stress : Not at all  Social Connections: Moderately Integrated  . Frequency of Communication with Friends and Family: More than three times a week  . Frequency of Social Gatherings with Friends and Family: Once a week  . Attends Religious Services: 1 to 4 times per year  . Active Member of Clubs or Organizations: No  . Attends Archivist Meetings: Never  . Marital Status: Married  Human resources officer Violence: Not At Risk  . Fear of Current or Ex-Partner: No  . Emotionally Abused: No  . Physically Abused: No  . Sexually Abused: No    Family History  Problem Relation Age of Onset  . Ovarian cancer Mother   . Stroke Mother   . Alcohol abuse Father   . Stroke Father   . Diabetes Brother   . Cancer Paternal Aunt        leukemia, unknown type  . Seizures Daughter   . Hypertension Other   . Colon cancer Neg Hx     Past Medical History:  Diagnosis Date  . Abdominal pain 05/29/2013   s/p rx of cephalo resistant e coli   but last rx NG  now residular ?  bladder sx repeat cx sx rx to ty and uro consult   . ADJ DISORDER WITH MIXED ANXIETY & DEPRESSED MOOD 03/03/2010   Qualifier: Diagnosis of  By: [name], [name]   . Agent resistant to multiple antibiotics 05/29/2013   e coli   bu NG on fu.    . ARF (acute renal failure) ([place]) 03/12/2015  . Closed head injury 02/01/2011   from syncope and had scalp laceration  neg ct .    Marland Kitchen Closed head injury 5-6 yrs ago  . Colitis 11/27/2017  .  Complication of anesthesia    migraine several hours after general anesthesia  . Fatty liver   . Gall stones 2016   see ct scan neg HIDA   . GERD (gastroesophageal reflux disease)   . Hearing aid worn   . HOH (hard of hearing)    both ears  . Hyperlipidemia   . Hypertension    echo nl lv function  mild dilitation 2009  . Kidney infection    few yrs ago in hospital  . Medication side effect 09/02/2010   Poss muscle se of 10 crestor   . Migraine    hypnic HA eval by Dr. Earley Favor in the past  . Polycythemia   . 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  Pyelonephritis 03/12/2015  . 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 Syncope 02/01/2011   In shower on vacation  sustained head laceration  8 sutures Had ed visit neg head ct labs and x ray   . Trigeminal neuralgia pain     Patient Active Problem List   Diagnosis Date Noted  . 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 Radiculopathy due to lumbar intervertebral disc disorder 03/27/2019  . 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 Diarrhea 11/27/2017  . Unilateral headache 05/26/2015  . Gallstones 03/12/2015  . Nausea without vomiting 12/10/2014  . Essential hypertension 05/29/2014  . 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 Low back pain radiating to right leg 01/16/2013  . Sinus problem 11/20/2012  . Chronic headaches morning 10/19/2012  . Low sodium levels  133 10/19/2012  . Leg pain, posterior 08/16/2012  . Sciatic neuritis 08/16/2012  . Sleep disturbance, unspecified 08/16/2012  . Medication management 03/24/2012  . Preventative health care 09/24/2011  . Trigeminal neuralgia 09/24/2011  . Postmenopausal HRT (hormone replacement therapy) 09/24/2011  . Polycythemia 09/20/2011  . Morning headache 02/01/2011  . Hearing aid worn   . Abnormal blood finding   . LOCALIZED SUPERFICIAL SWELLING MASS OR LUMP 09/21/2009  . DYSPNEA 05/07/2008   . Hypnic headache 03/19/2008  . 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 Headache(784.0) 06/05/2007  . JAW PAIN 04/06/2007  . Osteoarthritis 04/06/2007  . ABNORMAL RESULT, FUNCTION STUDY, [facility] 02/05/2007  . HYPERLIPIDEMIA 12/25/2006  . HYPERTENSION 12/25/2006    Past Surgical History:  Procedure Laterality Date  . ABDOMINAL HYSTERECTOMY  2002   tubal  . CARDIAC CATHETERIZATION  2000   chest pains neg  . CHOLECYSTECTOMY N/A 02/21/2017   Procedure: LAPAROSCOPIC CHOLECYSTECTOMY WITH INTRAOPERATIVE CHOLANGIOGRAM;  Surgeon: Armandina Gemma, MD;  Location: [facility];  Service: General;  Laterality: N/A;  . COLONOSCOPY     multiple  . CRANIOTOMY  12/09/2011   nerve decompression right trigeminal   . DOPPLER ECHOCARDIOGRAPHY  2009   nl lv function mild lv dilitation  . EYE SURGERY Bilateral    ioc for catatracts  . laparoscopic gallbladder surgery  02/16/2017   F[facility]  . OOPHORECTOMY Bilateral 2002  . rt shoulder surgery      Current Outpatient Medications  Medication Sig Dispense Refill  . Acetaminophen (TYLENOL PO) Take 2 tablets by mouth as needed.    Marland Kitchen albuterol (PROVENTIL HFA;VENTOLIN HFA) 108 (90 Base) MCG/ACT inhaler Inhale 2 puffs into the lungs every 6 (six) hours as needed. 1 Inhaler 2  . alendronate (FOSAMAX) 70 MG tablet Take 1 tablet (70 mg total) by mouth every 7 (seven) days. Take with a full glass of water on an empty stomach. 4 tablet 11  . benazepril (LOTENSIN) 20 MG tablet TAKE 1 TABLET(20 MG) BY MOUTH DAILY 90 tablet 1  . butalbital-acetaminophen-caffeine (FIORICET WITH CODEINE) 50-325-40-30 MG capsule Take 1 capsule by mouth every 4 (four) hours as needed for headache. Rescue 30 capsule 0  . carvedilol (COREG) 25 MG tablet TAKE 1 TABLET(25 MG) BY MOUTH TWICE DAILY WITH A MEAL 60 tablet 3  . cloNIDine (CATAPRES) 0.1 MG tablet TAKE 1 TABLET(0.1 MG) BY MOUTH TWICE DAILY 60 tablet 3  . conjugated estrogens (PREMARIN) vaginal  cream Place 1 Applicatorful vaginally 2 (two) times a week.    . fluticasone (FLONASE) 50 MCG/ACT nasal spray Place 2 sprays into both nostrils as needed for allergies or rhinitis.  16 g 5  . furosemide (LASIX) 20 MG tablet TAKE 1 TABLET(20 MG) BY MOUTH DAILY 30 tablet 3  . Multiple Vitamin (MULTIVITAMIN ADULT PO) Take 1 tablet by mouth.    Marland Kitchen omeprazole (PRILOSEC) 20 MG capsule Take 1 capsule (20 mg total) by mouth daily. 30 capsule 2  . ondansetron (ZOFRAN-ODT) 4 MG disintegrating tablet Take 1-2 tablets (4-8 mg total) by mouth every 8 (eight) hours as needed for nausea. 60 tablet 3  . OVER THE COUNTER MEDICATION OTC eye drops    . lamoTRIgine (LAMICTAL) 100 MG tablet Take 1 tablet (100 mg total) by mouth 2 (two) times daily. 180 tablet 3   No current facility-administered medications for this visit.    Allergies as of 05/11/2020 - Review Complete 05/11/2020  Allergen Reaction Noted  . Sulfamethoxazole-trimethoprim Other (See Comments)   . Sulfur Hives and Itching 07/08/2019    Vitals: BP (!) 184/95 (BP Location: Left Arm, Patient Position: Sitting)   Pulse 71   Ht 5' 6"  (1.676 m)   Wt 122 lb (55.3 kg)   BMI 19.69 kg/m  Last Weight:  Wt Readings from Last 1 Encounters:  05/11/20 122 lb (55.3 kg)   Last Height:   Ht Readings from Last 1 Encounters:  05/11/20 5' 6"  (1.676 m)     Physical exam: Exam: Gen: NAD, conversant, well nourised, well groomed                     CV: RRR, no MRG. No Carotid Bruits. No peripheral edema, warm, nontender Eyes: Conjunctivae clear without exudates or hemorrhage  Neuro: Detailed Neurologic Exam  Speech:    Speech is normal; fluent and spontaneous with normal comprehension.  Cognition:    The patient is oriented to person, place, and time;     recent and remote memory intact;     language fluent;     normal attention, concentration,     fund of knowledge Cranial Nerves:    The pupils are equal, round, and reactive to light. Could not  visualize fundi Visual fields are full to finger confrontation. Extraocular movements are intact. Trigeminal sensation is intact and the muscles of mastication are normal. The face is symmetric. The palate elevates in the midline. Hearing intact. Voice is normal. Shoulder shrug is normal. The tongue has normal motion without fasciculations.   Coordination:    Normal finger to nose and heel to shin. Normal rapid alternating movements.   Gait:    Not atxic, slightly antalgic  Motor Observation:    No asymmetry, no atrophy, and no involuntary movements noted. Tone:    Normal muscle tone.    Posture:    Posture is normal. normal erect    Strength:    Strength is V/V in the upper and lower limbs.      Sensation: intact to LT     Reflex Exam:  DTR's:    Deep tendon reflexes in the upper and lower extremities are symmetrical bilaterally.   Toes:    The toes are downgoing bilaterally.   Clonus:    Clonus is absent.    Assessment/Plan:  79 year old femle with severe pain. She has suggered with trigeminal neuralgia ([name]) since 2008, in 2013 had microvascular decompression at [facility]. Very difficult case, patient had hyponatremia with Tegretol, already on a high dose of Trileptal which was recently increased so may take time. Adding another anti-epilepsy medication or baclofen is a possibility but I do worry about side  effects of having multiple AEDs or baclofen in an 79 year old. I have contacted Dr. Vertell Limber to see if Dr. Maryjean Ka can perform any interventions. Will try nerve block today. I'ver tried botox with other patients in the past with poor results, and risk of affecting CN 7, in the past. Discussed with Dr. Vertell Limber about his team performing gamma knife, balloon, rhizotomy or others. Discussed at length with patient. I do not think she needs repeat imaging, symptoms had not changed, no new symptoms or red flags.  Procedure with Dr. Vertell Limber helped a lot, she is doing well on Lamictal. Will check  labs and continue  Orders Placed This Encounter  Procedures  . CBC with Differential/Platelets  . Lamotrigine level  . Comprehensive metabolic panel   Meds ordered this encounter  Medications  . lamoTRIgine (LAMICTAL) 100 MG tablet    Sig: Take 1 tablet (100 mg total) by mouth 2 (two) times daily.    Dispense:  180 tablet    Refill:  3    PRIOR: Hyponatremia with Tegretol, on 1238m daily Trileptal, other medications with evidence include: baclofen,Gabapentin, lamictal, lyrica, baclofen, botox  Performed a peripheral nerve block today, unfortunately affected CN 7 but did relieve her [name] severe pain, advised to monitor to keep eye lubricated(drying out of cornea can cause blindness) and also wear sunglasses until at least her blink reflex returns, no vision changes she can drive.  Performed by Dr. AJaynee EaglesM.D. . All procedures a documented blood were medically necessary, reasonable and appropriate based on the patient's history, medical diagnosis and physician opinion. Verbal informed consent was obtained from the patient, patient was informed of potential risk of procedure, including bruising, bleeding, hematoma formation, infection, muscle weakness, muscle pain, numbness, transient hypertension, transient hyperglycemia and transient insomnia among others. All areas injected were topically clean with isopropyl rubbing alcohol. Nonsterile nonlatex gloves were worn during the procedure.  Trigeminal nerve block (64400): Trigeminal nerve site was identified along the  Medication was injected into the right trigeminal nerve areas. Patient's condition is associated with inflammation of the trigeminal nerve and associated muscle groups. Injection was deemed medically necessary, reasonable and appropriate. Injection represents a separate and unique surgical service.   Meds ordered this encounter  Medications  . lamoTRIgine (LAMICTAL) 100 MG tablet    Sig: Take 1 tablet (100 mg total) by mouth 2  (two) times daily.    Dispense:  180 tablet    Refill:  3    Cc: [name], WStandley Brooking MD, JErline Levine MD  I spent over 20 minutes of face-to-face and non-face-to-face time with patient on the  1. Long-term use of high-risk medication    diagnosis.  This included previsit chart review, lab review, study review, order entry, electronic health record documentation, patient education on the different diagnostic and therapeutic options, counseling and coordination of care, risks and benefits of management, compliance, or risk factor reduction   ASarina Ill MD  [facility] [address] [place] [id number] Phone [phone] [phone]

## 2020-05-12 LAB — COMPREHENSIVE METABOLIC PANEL
ALT: 15 IU/L (ref 0–32)
AST: 18 IU/L (ref 0–40)
Albumin/Globulin Ratio: 1.8 (ref 1.2–2.2)
Albumin: 4 g/dL (ref 3.7–4.7)
Alkaline Phosphatase: 69 IU/L (ref 44–121)
BUN/Creatinine Ratio: 21 (ref 12–28)
BUN: 18 mg/dL (ref 8–27)
Bilirubin Total: 0.4 mg/dL (ref 0.0–1.2)
CO2: 27 mmol/L (ref 20–29)
Calcium: 9.3 mg/dL (ref 8.7–10.3)
Chloride: 98 mmol/L (ref 96–106)
Creatinine, Ser: 0.87 mg/dL (ref 0.57–1.00)
GFR calc Af Amer: 73 mL/min/{1.73_m2} (ref >59)
GFR calc non Af Amer: 63 mL/min/{1.73_m2} (ref >59)
Globulin, Total: 2.2 g/dL (ref 1.5–4.5)
Glucose: 97 mg/dL (ref 65–99)
Potassium: 4.3 mmol/L (ref 3.5–5.2)
Sodium: 140 mmol/L (ref 134–144)
Total Protein: 6.2 g/dL (ref 6.0–8.5)

## 2020-05-12 LAB — CBC WITH DIFFERENTIAL/PLATELET
Basophils Absolute: 0 10*3/uL (ref 0.0–0.2)
Basos: 1 %
EOS (ABSOLUTE): 0.1 10*3/uL (ref 0.0–0.4)
Eos: 4 %
Hematocrit: 39.7 % (ref 34.0–46.6)
Hemoglobin: 13.7 g/dL (ref 11.1–15.9)
Immature Grans (Abs): 0 10*3/uL (ref 0.0–0.1)
Immature Granulocytes: 1 %
Lymphocytes Absolute: 0.9 10*3/uL (ref 0.7–3.1)
Lymphs: 23 %
MCH: 30.6 pg (ref 26.6–33.0)
MCHC: 34.5 g/dL (ref 31.5–35.7)
MCV: 89 fL (ref 79–97)
Monocytes Absolute: 0.4 10*3/uL (ref 0.1–0.9)
Monocytes: 10 %
Neutrophils Absolute: 2.3 10*3/uL (ref 1.4–7.0)
Neutrophils: 61 %
Platelets: 192 10*3/uL (ref 150–450)
RBC: 4.48 x10E6/uL (ref 3.77–5.28)
RDW: 13 % (ref 11.7–15.4)
WBC: 3.7 10*3/uL (ref 3.4–10.8)

## 2020-05-12 LAB — LAMOTRIGINE LEVEL: Lamotrigine Lvl: 4.9 ug/mL (ref 2.0–20.0)

## 2020-05-15 ENCOUNTER — Other Ambulatory Visit: Payer: Self-pay

## 2020-05-15 ENCOUNTER — Observation Stay (HOSPITAL_COMMUNITY)
Admission: EM | Admit: 2020-05-15 | Discharge: 2020-05-16 | Disposition: A | Discharge status: Home / Self Care | Payer: Medicare Other | Attending: Internal Medicine | Admitting: Internal Medicine

## 2020-05-15 ENCOUNTER — Encounter (HOSPITAL_COMMUNITY): Payer: Self-pay | Admitting: Emergency Medicine

## 2020-05-15 DIAGNOSIS — Z9861 Coronary angioplasty status: Secondary | ICD-10-CM | POA: Insufficient documentation

## 2020-05-15 DIAGNOSIS — J189 Pneumonia, unspecified organism: Secondary | ICD-10-CM

## 2020-05-15 DIAGNOSIS — Z79899 Other long term (current) drug therapy: Secondary | ICD-10-CM | POA: Insufficient documentation

## 2020-05-15 DIAGNOSIS — I10 Essential (primary) hypertension: Secondary | ICD-10-CM | POA: Diagnosis not present

## 2020-05-15 DIAGNOSIS — R059 Cough, unspecified: Secondary | ICD-10-CM

## 2020-05-15 DIAGNOSIS — K661 Hemoperitoneum: Secondary | ICD-10-CM | POA: Insufficient documentation

## 2020-05-15 DIAGNOSIS — R1084 Generalized abdominal pain: Secondary | ICD-10-CM | POA: Diagnosis not present

## 2020-05-15 DIAGNOSIS — I700 Atherosclerosis of aorta: Secondary | ICD-10-CM | POA: Diagnosis not present

## 2020-05-15 DIAGNOSIS — U071 COVID-19: Principal | ICD-10-CM | POA: Insufficient documentation

## 2020-05-15 DIAGNOSIS — K5730 Diverticulosis of large intestine without perforation or abscess without bleeding: Secondary | ICD-10-CM | POA: Diagnosis not present

## 2020-05-15 DIAGNOSIS — R109 Unspecified abdominal pain: Secondary | ICD-10-CM | POA: Diagnosis present

## 2020-05-15 DIAGNOSIS — J1281 Pneumonia due to SARS-associated coronavirus: Secondary | ICD-10-CM | POA: Insufficient documentation

## 2020-05-15 DIAGNOSIS — S3011XA Contusion of abdominal wall, initial encounter: Secondary | ICD-10-CM

## 2020-05-15 DIAGNOSIS — M1990 Unspecified osteoarthritis, unspecified site: Secondary | ICD-10-CM | POA: Diagnosis present

## 2020-05-15 DIAGNOSIS — G500 Trigeminal neuralgia: Secondary | ICD-10-CM | POA: Diagnosis present

## 2020-05-15 DIAGNOSIS — M7989 Other specified soft tissue disorders: Secondary | ICD-10-CM | POA: Diagnosis not present

## 2020-05-15 DIAGNOSIS — R001 Bradycardia, unspecified: Secondary | ICD-10-CM | POA: Diagnosis not present

## 2020-05-15 DIAGNOSIS — S301XXA Contusion of abdominal wall, initial encounter: Secondary | ICD-10-CM

## 2020-05-15 DIAGNOSIS — N281 Cyst of kidney, acquired: Secondary | ICD-10-CM | POA: Diagnosis not present

## 2020-05-15 LAB — COMPREHENSIVE METABOLIC PANEL
ALT: 15 U/L (ref 0–44)
AST: 17 U/L (ref 15–41)
Albumin: 3.2 g/dL — ABNORMAL LOW (ref 3.5–5.0)
Alkaline Phosphatase: 54 U/L (ref 38–126)
Anion gap: 12 (ref 5–15)
BUN: 15 mg/dL (ref 8–23)
CO2: 26 mmol/L (ref 22–32)
Calcium: 9.1 mg/dL (ref 8.9–10.3)
Chloride: 101 mmol/L (ref 98–111)
Creatinine, Ser: 0.89 mg/dL (ref 0.44–1.00)
GFR, Estimated: >60 mL/min (ref >60)
Glucose, Bld: 96 mg/dL (ref 70–99)
Potassium: 4 mmol/L (ref 3.5–5.1)
Sodium: 139 mmol/L (ref 135–145)
Total Bilirubin: 0.9 mg/dL (ref 0.3–1.2)
Total Protein: 6 g/dL — ABNORMAL LOW (ref 6.5–8.1)

## 2020-05-15 LAB — URINALYSIS, ROUTINE W REFLEX MICROSCOPIC
Bilirubin Urine: NEGATIVE
Glucose, UA: NEGATIVE mg/dL
Hgb urine dipstick: NEGATIVE
Ketones, ur: 5 mg/dL — AB
Leukocytes,Ua: NEGATIVE
Nitrite: NEGATIVE
Protein, ur: NEGATIVE mg/dL
Specific Gravity, Urine: 1.01 (ref 1.005–1.030)
pH: 6 (ref 5.0–8.0)

## 2020-05-15 LAB — CBC
HCT: 42 % (ref 36.0–46.0)
Hemoglobin: 13.7 g/dL (ref 12.0–15.0)
MCH: 30.5 pg (ref 26.0–34.0)
MCHC: 32.6 g/dL (ref 30.0–36.0)
MCV: 93.5 fL (ref 80.0–100.0)
Platelets: 229 10*3/uL (ref 150–400)
RBC: 4.49 MIL/uL (ref 3.87–5.11)
RDW: 13.5 % (ref 11.5–15.5)
WBC: 7.6 10*3/uL (ref 4.0–10.5)
nRBC: 0 % (ref 0.0–0.2)

## 2020-05-15 LAB — LIPASE, BLOOD: Lipase: 25 U/L (ref 11–51)

## 2020-05-15 MED ORDER — LAMOTRIGINE 100 MG PO TABS
100.0000 mg | ORAL_TABLET | Freq: Once | ORAL | Status: AC
  Administered 2020-05-15: 100 mg via ORAL
  Filled 2020-05-15: qty 1

## 2020-05-15 NOTE — ED Triage Notes (Signed)
Pt arrived via EMS from home. Pt has left upper quadrant pain; pt has had a cough for about a week and her stomach began hurting about 6 hours ago. Pt has felt nauseous since her stomach began hurting. Pt has hx of hypertension, she says that she took something for it this morning but that it is time for her evening medication.

## 2020-05-16 ENCOUNTER — Observation Stay (HOSPITAL_COMMUNITY): Payer: Medicare Other

## 2020-05-16 ENCOUNTER — Other Ambulatory Visit (HOSPITAL_COMMUNITY): Payer: Medicare Other

## 2020-05-16 ENCOUNTER — Encounter (HOSPITAL_COMMUNITY): Payer: Self-pay

## 2020-05-16 ENCOUNTER — Emergency Department (HOSPITAL_COMMUNITY): Payer: Medicare Other

## 2020-05-16 DIAGNOSIS — R059 Cough, unspecified: Secondary | ICD-10-CM | POA: Diagnosis not present

## 2020-05-16 DIAGNOSIS — S301XXA Contusion of abdominal wall, initial encounter: Secondary | ICD-10-CM | POA: Diagnosis not present

## 2020-05-16 DIAGNOSIS — M7989 Other specified soft tissue disorders: Secondary | ICD-10-CM | POA: Diagnosis not present

## 2020-05-16 DIAGNOSIS — N281 Cyst of kidney, acquired: Secondary | ICD-10-CM | POA: Diagnosis not present

## 2020-05-16 DIAGNOSIS — K5730 Diverticulosis of large intestine without perforation or abscess without bleeding: Secondary | ICD-10-CM | POA: Diagnosis not present

## 2020-05-16 DIAGNOSIS — R109 Unspecified abdominal pain: Secondary | ICD-10-CM | POA: Diagnosis present

## 2020-05-16 DIAGNOSIS — I700 Atherosclerosis of aorta: Secondary | ICD-10-CM | POA: Diagnosis not present

## 2020-05-16 LAB — CBC
HCT: 36.5 % (ref 36.0–46.0)
HCT: 38.2 % (ref 36.0–46.0)
Hemoglobin: 12.1 g/dL (ref 12.0–15.0)
Hemoglobin: 12.7 g/dL (ref 12.0–15.0)
MCH: 30.1 pg (ref 26.0–34.0)
MCH: 30.8 pg (ref 26.0–34.0)
MCHC: 33.2 g/dL (ref 30.0–36.0)
MCHC: 33.2 g/dL (ref 30.0–36.0)
MCV: 90.8 fL (ref 80.0–100.0)
MCV: 92.5 fL (ref 80.0–100.0)
Platelets: 210 10*3/uL (ref 150–400)
Platelets: 203 10*3/uL (ref 150–400)
RBC: 4.02 MIL/uL (ref 3.87–5.11)
RBC: 4.13 MIL/uL (ref 3.87–5.11)
RDW: 13.6 % (ref 11.5–15.5)
RDW: 13.6 % (ref 11.5–15.5)
WBC: 7 10*3/uL (ref 4.0–10.5)
WBC: 6.5 10*3/uL (ref 4.0–10.5)
nRBC: 0 % (ref 0.0–0.2)
nRBC: 0 % (ref 0.0–0.2)

## 2020-05-16 LAB — BASIC METABOLIC PANEL
Anion gap: 11 (ref 5–15)
BUN: 12 mg/dL (ref 8–23)
CO2: 26 mmol/L (ref 22–32)
Calcium: 8.4 mg/dL — ABNORMAL LOW (ref 8.9–10.3)
Chloride: 102 mmol/L (ref 98–111)
Creatinine, Ser: 0.8 mg/dL (ref 0.44–1.00)
GFR, Estimated: >60 mL/min (ref >60)
Glucose, Bld: 105 mg/dL — ABNORMAL HIGH (ref 70–99)
Potassium: 3.5 mmol/L (ref 3.5–5.1)
Sodium: 139 mmol/L (ref 135–145)

## 2020-05-16 LAB — PROTIME-INR
INR: 1.1 (ref 0.8–1.2)
Prothrombin Time: 13.3 seconds (ref 11.4–15.2)

## 2020-05-16 LAB — RESP PANEL BY RT-PCR (FLU A&B, COVID) ARPGX2
Influenza A by PCR: NEGATIVE
Influenza B by PCR: NEGATIVE
SARS Coronavirus 2 by RT PCR: POSITIVE — AB

## 2020-05-16 LAB — APTT: aPTT: 36 seconds (ref 24–36)

## 2020-05-16 MED ORDER — ACETAMINOPHEN 325 MG PO TABS
325.0000 mg | ORAL_TABLET | Freq: Four times a day (QID) | ORAL | Status: DC | PRN
  Administered 2020-05-16 (×3): 325 mg via ORAL
  Filled 2020-05-16 (×3): qty 1

## 2020-05-16 MED ORDER — SODIUM CHLORIDE 0.9 % IV SOLN: 500.0000 mg | INTRAVENOUS | Status: DC

## 2020-05-16 MED ORDER — HYDROCOD POLST-CPM POLST ER 10-8 MG/5ML PO SUER
5.0000 mL | Freq: Two times a day (BID) | ORAL | Status: DC | PRN
  Administered 2020-05-16: 5 mL via ORAL
  Filled 2020-05-16: qty 5

## 2020-05-16 MED ORDER — CEFDINIR 300 MG PO CAPS: 300.0000 mg | ORAL_CAPSULE | Freq: Two times a day (BID) | ORAL | 0 refills | Status: AC

## 2020-05-16 MED ORDER — FUROSEMIDE 40 MG PO TABS
20.0000 mg | ORAL_TABLET | Freq: Every day | ORAL | Status: DC
  Administered 2020-05-16: 20 mg via ORAL
  Filled 2020-05-16: qty 1

## 2020-05-16 MED ORDER — FAMOTIDINE IN NACL 20-0.9 MG/50ML-% IV SOLN: 20.0000 mg | Freq: Once | INTRAVENOUS | Status: DC | PRN

## 2020-05-16 MED ORDER — DIPHENHYDRAMINE HCL 50 MG/ML IJ SOLN: 50.0000 mg | Freq: Once | INTRAMUSCULAR | Status: DC | PRN

## 2020-05-16 MED ORDER — CARVEDILOL 12.5 MG PO TABS
25.0000 mg | ORAL_TABLET | Freq: Two times a day (BID) | ORAL | Status: DC
  Administered 2020-05-16 (×2): 25 mg via ORAL
  Filled 2020-05-16 (×2): qty 2

## 2020-05-16 MED ORDER — ONDANSETRON HCL 4 MG/2ML IJ SOLN
4.0000 mg | Freq: Four times a day (QID) | INTRAMUSCULAR | Status: DC | PRN
  Administered 2020-05-16: 4 mg via INTRAVENOUS
  Filled 2020-05-16: qty 2

## 2020-05-16 MED ORDER — ONDANSETRON HCL 4 MG PO TABS: 4.0000 mg | ORAL_TABLET | Freq: Four times a day (QID) | ORAL | Status: DC | PRN

## 2020-05-16 MED ORDER — ALBUTEROL SULFATE HFA 108 (90 BASE) MCG/ACT IN AERS: 2.0000 | INHALATION_SPRAY | Freq: Four times a day (QID) | RESPIRATORY_TRACT | Status: DC | PRN

## 2020-05-16 MED ORDER — BENAZEPRIL HCL 20 MG PO TABS: 20.0000 mg | ORAL_TABLET | Freq: Every day | ORAL | Status: DC

## 2020-05-16 MED ORDER — ACETAMINOPHEN 325 MG RE SUPP: 325.0000 mg | Freq: Four times a day (QID) | RECTAL | Status: DC | PRN

## 2020-05-16 MED ORDER — SODIUM CHLORIDE 0.9 % IV SOLN: 1.0000 g | INTRAVENOUS | Status: DC

## 2020-05-16 MED ORDER — SODIUM CHLORIDE 0.9 % IV SOLN: Freq: Once | INTRAVENOUS | Status: DC

## 2020-05-16 MED ORDER — ALBUTEROL SULFATE HFA 108 (90 BASE) MCG/ACT IN AERS: 2.0000 | INHALATION_SPRAY | Freq: Once | RESPIRATORY_TRACT | Status: DC | PRN

## 2020-05-16 MED ORDER — CARVEDILOL 12.5 MG PO TABS: 25.0000 mg | ORAL_TABLET | Freq: Two times a day (BID) | ORAL | Status: DC

## 2020-05-16 MED ORDER — CLONIDINE HCL 0.1 MG PO TABS
0.1000 mg | ORAL_TABLET | Freq: Two times a day (BID) | ORAL | Status: DC
  Administered 2020-05-16 (×2): 0.1 mg via ORAL
  Filled 2020-05-16 (×2): qty 1

## 2020-05-16 MED ORDER — AZITHROMYCIN 500 MG PO TABS
500.0000 mg | ORAL_TABLET | Freq: Every day | ORAL | 0 refills | Status: AC
Start: 2020-05-17 — End: 2020-05-21

## 2020-05-16 MED ORDER — BENAZEPRIL HCL 20 MG PO TABS
20.0000 mg | ORAL_TABLET | Freq: Every day | ORAL | Status: DC
Start: 2020-05-16 — End: 2020-05-16
  Administered 2020-05-16: 20 mg via ORAL
  Filled 2020-05-16: qty 1

## 2020-05-16 MED ORDER — METHYLPREDNISOLONE SODIUM SUCC 125 MG IJ SOLR: 125.0000 mg | Freq: Once | INTRAMUSCULAR | Status: DC | PRN

## 2020-05-16 MED ORDER — CEFDINIR 300 MG PO CAPS
300.0000 mg | ORAL_CAPSULE | Freq: Two times a day (BID) | ORAL | Status: DC
  Administered 2020-05-16: 300 mg via ORAL
  Filled 2020-05-16 (×3): qty 1

## 2020-05-16 MED ORDER — PANTOPRAZOLE SODIUM 40 MG PO TBEC
40.0000 mg | DELAYED_RELEASE_TABLET | Freq: Every day | ORAL | Status: DC
  Administered 2020-05-16: 40 mg via ORAL
  Filled 2020-05-16: qty 1

## 2020-05-16 MED ORDER — SODIUM CHLORIDE 0.9 % IV SOLN: INTRAVENOUS | Status: DC | PRN

## 2020-05-16 MED ORDER — BUTALBITAL-APAP-CAFFEINE 50-325-40 MG PO TABS
1.0000 | ORAL_TABLET | Freq: Four times a day (QID) | ORAL | Status: DC | PRN
  Administered 2020-05-16: 1 via ORAL
  Filled 2020-05-16: qty 1

## 2020-05-16 MED ORDER — EPINEPHRINE 0.3 MG/0.3ML IJ SOAJ: 0.3000 mg | Freq: Once | INTRAMUSCULAR | Status: DC | PRN

## 2020-05-16 MED ORDER — SODIUM CHLORIDE 0.9 % IV SOLN
500.0000 mg | Freq: Once | INTRAVENOUS | Status: AC
  Administered 2020-05-16: 500 mg via INTRAVENOUS
  Filled 2020-05-16: qty 500

## 2020-05-16 MED ORDER — BENZONATATE 100 MG PO CAPS: 100.0000 mg | ORAL_CAPSULE | Freq: Three times a day (TID) | ORAL | 0 refills | Status: DC | PRN

## 2020-05-16 MED ORDER — SODIUM CHLORIDE 0.9 % IV SOLN
Freq: Once | INTRAVENOUS | Status: AC
  Filled 2020-05-16: qty 5

## 2020-05-16 MED ORDER — LAMOTRIGINE 100 MG PO TABS
100.0000 mg | ORAL_TABLET | Freq: Two times a day (BID) | ORAL | Status: DC
  Administered 2020-05-16: 100 mg via ORAL
  Filled 2020-05-16: qty 1

## 2020-05-16 MED ORDER — IOHEXOL 300 MG/ML  SOLN
100.0000 mL | Freq: Once | INTRAMUSCULAR | Status: AC | PRN
  Administered 2020-05-16: 100 mL via INTRAVENOUS

## 2020-05-16 MED ORDER — FLUTICASONE PROPIONATE 50 MCG/ACT NA SUSP
2.0000 | NASAL | Status: DC | PRN
  Filled 2020-05-16: qty 16

## 2020-05-16 MED ORDER — AZITHROMYCIN 250 MG PO TABS: 500.0000 mg | ORAL_TABLET | Freq: Every day | ORAL | Status: DC

## 2020-05-16 MED ORDER — SODIUM CHLORIDE 0.9 % IV SOLN
1.0000 g | Freq: Once | INTRAVENOUS | Status: AC
  Administered 2020-05-16: 1 g via INTRAVENOUS
  Filled 2020-05-16: qty 10

## 2020-05-16 MED ORDER — POTASSIUM CHLORIDE CRYS ER 20 MEQ PO TBCR
40.0000 meq | EXTENDED_RELEASE_TABLET | Freq: Once | ORAL | Status: AC
  Administered 2020-05-16: 40 meq via ORAL
  Filled 2020-05-16: qty 2

## 2020-05-16 MED ORDER — BENZONATATE 100 MG PO CAPS: 200.0000 mg | ORAL_CAPSULE | Freq: Two times a day (BID) | ORAL | Status: DC | PRN

## 2020-05-16 NOTE — Discharge Summary (Signed)
[facility]  Physician Discharge Summary   Patient ID: [name] MRN: [id number] DOB/AGE: [birth year] 79 y.o.  Admit date: 05/15/2020 Discharge date: 05/17/2020  PCP: [name], MD  DISCHARGE DIAGNOSES:  Abdominal wall hematoma COVID-19 infection Possible superimposed bacterial pneumonia as well   RECOMMENDATIONS FOR OUTPATIENT FOLLOW UP: 1. Patient asked to follow-up with her PCP in a few days to have her hemoglobin rechecked    Home Health: None Equipment/Devices: None  CODE STATUS: DNR  DISCHARGE CONDITION: fair  Diet recommendation: As before  INITIAL HISTORY: 79 y.o. female with medical history significant for hypertension, GERD, right leg sciatica, trigeminal neuralgia, presented to the ED for chief concern of abdominal pain that started on day of presentaiton 05/15/20.   The abdominal pain started approximately 5:30 PM, described as sharp, 7-8/10 at its peak, worse with palpation, straining, and coughing. She reports the pain is on the left side. She denies trauma in the last two weeks.   She reports the pain is improved when she is not straining and/or coughing. She endorses a cough that started about two weeks ago, productive, thick dark green mucus. She denies fever at home.    HOSPITAL COURSE:   Hematoma involving abdominal wall Patient was observed in the hospital for her left abdominal and pelvic wall hematoma.  She remained hemodynamically stable.  Hemoglobin was monitored frequently and that also remained stable.  Patient's pain improved significantly after she was given analgesic agents.  The hematoma was likely precipitated by her recent bout with cough as well as bumping her side against a piece of furniture at home.  She was ambulated in the hallway.  She was given a diet.  Hemoglobin remained stable.  She expressed an interest in going home.  She was felt to be safe for discharge.  She will need to follow-up with PCP in a  few days to have her hemoglobin checked again.  She was asked to come back to the hospital for any worsening symptoms.  COVID-19 viral infection/community-acquired pneumonia Patient was also incidentally noted to be COVID-19 positive.  However she had been having cough with greenish expectoration recently.  Infiltrate in the left lung was noted.  However the findings are not typical of pneumonia due to COVID-19.  She was treated with antibacterials.  She did not have any oxygen requirement.  No increased work of breathing was noted.  She was thought to have mild Covid based on her symptoms.  Monoclonal antibody treatment was offered and after discussions with patient's husband and daughter this was administered.  Patient is status post vaccination but did not get a booster yet.  Her other medical issues including essential hypertension, trigeminal neuralgia, migraine headaches all remained stable.  She did have an episode of migraine headache before discharge which was treated with Fioricet.  Okay for discharge home today.   PERTINENT LABS:  The results of significant diagnostics from this hospitalization (including imaging, microbiology, ancillary and laboratory) are listed below for reference.    Microbiology: Recent Results (from the past 240 hour(s))  Urine culture     Status: Abnormal   Collection Time: 05/15/20 11:11 PM   Specimen: Urine, Clean Catch  Result Value Ref Range Status   Specimen Description   Final    URINE, CLEAN CATCH Performed at [facility], [place] [address], [place], [place] [address]    Special Requests   Final    NONE Performed at [facility], [place]  [address] [place], [place] [address]    Culture (A)  Final    <10,000 COLONIES/mL INSIGNIFICANT GROWTH Performed at [facility] [address] [place], [place] [address]    Report Status 05/17/2020 FINAL  Final  Resp Panel by RT-PCR (Flu A&B, Covid) Nasopharyngeal  Swab     Status: Abnormal   Collection Time: 05/16/20  1:38 AM   Specimen: Nasopharyngeal Swab; Nasopharyngeal(NP) swabs in vial transport medium  Result Value Ref Range Status   SARS Coronavirus 2 by RT PCR POSITIVE (A) NEGATIVE Final    Comment: CRITICAL RESULT CALLED TO, READ BACK BY AND VERIFIED WITH: J. NASH RN 05/16/20 @0238  BY P.HENDERSON  (NOTE) SARS-CoV-2 target nucleic acids are DETECTED.  The SARS-CoV-2 RNA is generally detectable in upper respiratory specimens during the acute phase of infection. Positive results are indicative of the presence of the identified virus, but do not rule out bacterial infection or co-infection with other pathogens not detected by the test. Clinical correlation with patient history and other diagnostic information is necessary to determine patient infection status. The expected result is Negative.  Fact Sheet for Patients: EntrepreneurPulse.com.au  Fact Sheet for Healthcare Providers: IncredibleEmployment.be  This test is not yet approved or cleared by the [place] FDA and  has been authorized for detection and/or diagnosis of SARS-CoV-2 by FDA under an Emergency Use Authorization (EUA).  This EUA will remain in effect (meaning  this test can be used) for the duration of  the COVID-19 declaration under Section 564(b)(1) of the Act, 21 U.S.C. section 360bbb-3(b)(1), unless the authorization is terminated or revoked sooner.     Influenza A by PCR NEGATIVE NEGATIVE Final   Influenza B by PCR NEGATIVE NEGATIVE Final    Comment: (NOTE) The Xpert Xpress SARS-CoV-2/FLU/RSV plus assay is intended as an aid in the diagnosis of influenza from Nasopharyngeal swab specimens and should not be used as a sole basis for treatment. Nasal washings and aspirates are unacceptable for Xpert Xpress SARS-CoV-2/FLU/RSV testing.  Fact Sheet for Patients: EntrepreneurPulse.com.au  Fact Sheet for  Healthcare Providers: IncredibleEmployment.be  This test is not yet approved or cleared by the [place] FDA and has been authorized for detection and/or diagnosis of SARS-CoV-2 by FDA under an Emergency Use Authorization (EUA). This EUA will remain in effect (meaning this test can be used) for the duration of the COVID-19 declaration under Section 564(b)(1) of the Act, 21 U.S.C. section 360bbb-3(b)(1), unless the authorization is terminated or revoked.  Performed at [facility], [place] [address], [place], [place] [address]      Labs:  COVID-19 Labs   Lab Results  Component Value Date   SARSCOV2NAA POSITIVE (A) 05/16/2020      Basic Metabolic Panel: Recent Labs  Lab 05/11/20 1330 05/15/20 2239 05/16/20 0520  NA 140 139 139  K 4.3 4.0 3.5  CL 98 101 102  CO2 27 26 26   GLUCOSE 97 96 105*  BUN 18 15 12   CREATININE 0.87 0.89 0.80  CALCIUM 9.3 9.1 8.4*   Liver Function Tests: Recent Labs  Lab 05/11/20 1330 05/15/20 2239  AST 18 17  ALT 15 15  ALKPHOS 69 54  BILITOT 0.4 0.9  PROT 6.2 6.0*  ALBUMIN 4.0 3.2*   Recent Labs  Lab 05/15/20 2239  LIPASE 25   CBC: Recent Labs  Lab 05/11/20 1330 05/15/20 2239 05/16/20 0520 05/16/20 1319  WBC 3.7 7.6 7.0 6.5  NEUTROABS 2.3  --   --   --  HGB 13.7 13.7 12.1 12.7  HCT 39.7 42.0 36.5 38.2  MCV 89 93.5 90.8 92.5  PLT 192 229 210 203     IMAGING STUDIES CT ABDOMEN PELVIS W CONTRAST  Result Date: 05/16/2020 CLINICAL DATA:  Left upper quadrant pain. EXAM: CT ABDOMEN AND PELVIS WITH CONTRAST TECHNIQUE: Multidetector CT imaging of the abdomen and pelvis was performed using the standard protocol following bolus administration of intravenous contrast. CONTRAST:  139m OMNIPAQUE IOHEXOL 300 MG/ML  SOLN COMPARISON:  November 27, 2017 FINDINGS: Lower chest: Mild infiltrate is seen within the left lung base. A very small left pleural effusion is also noted. Hepatobiliary: No focal  liver abnormality is seen. Status post cholecystectomy. No biliary dilatation. Pancreas: Unremarkable. No pancreatic ductal dilatation or surrounding inflammatory changes. Spleen: Normal in size without focal abnormality. Adrenals/Urinary Tract: Adrenal glands are unremarkable. Kidneys are normal in size, without renal calculi or hydronephrosis. Stable bilateral simple renal cysts are seen. Bladder is unremarkable. Stomach/Bowel: Stomach is within normal limits. The appendix is not clearly identified. No evidence of bowel wall thickening, distention, or inflammatory changes. Noninflamed diverticula are seen throughout the large bowel. Vascular/Lymphatic: Aortic atherosclerosis. No enlarged abdominal or pelvic lymph nodes. Reproductive: Status post hysterectomy. No adnexal masses. Other: Moderate to marked severity soft tissue swelling is seen within the lateral aspect of the abdominal and pelvic wall on the left. An associated 5.2 cm x 3.4 cm x 6.9 cm isodense area is noted within the musculature. A thin curvilinear area of contrast enhancement is seen within this region (axial CT images 36 through 40/CT series #2 and sagittal reformatted image 149, CT series #6). A mild amount of para muscular inflammatory fat stranding is also present. Musculoskeletal: No acute or significant osseous findings. IMPRESSION: 1. Evolving hematoma within the lateral aspect of the abdominal and pelvic wall on the left, with additional findings suggestive of active bleeding. 2. Mild left basilar infiltrate with a very small left pleural effusion. 3. Colonic diverticulosis. 4. Stable bilateral simple renal cysts. 5. Aortic atherosclerosis. Aortic Atherosclerosis (ICD10-I70.0). Electronically Signed   By: TVirgina NorfolkM.D.   On: 05/16/2020 00:49   DG CHEST PORT 1 VIEW  Result Date: 05/16/2020 CLINICAL DATA:  Cough. EXAM: PORTABLE CHEST 1 VIEW COMPARISON:  November 26, 2018 FINDINGS: The lungs are hyperinflated. Mild atelectasis and/or  infiltrate is noted within the left lung base. There is no evidence of a pleural effusion or pneumothorax. The heart size and mediastinal contours are within normal limits. The visualized skeletal structures are unremarkable. IMPRESSION: Mild left basilar atelectasis and/or infiltrate. Electronically Signed   By: TVirgina NorfolkM.D.   On: 05/16/2020 03:32    DISCHARGE EXAMINATION: Vitals:   05/16/20 1356 05/16/20 1400 05/16/20 1500 05/16/20 1600  BP: (!) 175/90 (!) 167/92 (!) 156/76 (!) 171/93  Pulse: 68 61 66 64  Resp: 19 14 14 11   Temp:      TempSrc:      SpO2: 98% 97% 92% 95%  Weight:      Height:       General appearance: Awake alert.  In no distress Resp: Effort.  Diminished air entry at the bases with a few crackles on the left base.  No wheezing. Cardio: S1-S2 is normal regular.  No S3-S4.  No rubs murmurs or bruit GI: Abdomen is soft.  Tenderness noted in the left flank area.  Bruising was noted.  No rebound rigidity or guarding.  Bowel sounds present.  No masses organomegaly.  Extremities: No edema.  Full range of motion of lower extremities. Neurologic: Alert and oriented x3.  No focal neurological deficits.    DISPOSITION: Home  Discharge Instructions    Call MD for:  difficulty breathing, headache or visual disturbances   Complete by: As directed    Call MD for:  extreme fatigue   Complete by: As directed    Call MD for:  hives   Complete by: As directed    Call MD for:  persistant dizziness or light-headedness   Complete by: As directed    Call MD for:  persistant nausea and vomiting   Complete by: As directed    Call MD for:  severe uncontrolled pain   Complete by: As directed    Call MD for:  temperature >100.4   Complete by: As directed    Diet - low sodium heart healthy   Complete by: As directed    Discharge instructions   Complete by: As directed    Please take your medications as prescribed.  Be sure to follow-up with your primary care provider early  next week to check your hemoglobin at that time.  In the meantime if you develop dizziness lightheadedness have worsening abdominal pain or develop nausea vomiting please seek attention immediately by coming back to the hospital. Also seek attention if you develop a worsening respiratory symptoms including worsening shortness of breath, worsening cough or chest pains. Please stay isolated till May 25 2020.  You were cared for by a hospitalist during your hospital stay. If you have any questions about your discharge medications or the care you received while you were in the hospital after you are discharged, you can call the unit and asked to speak with the hospitalist on call if the hospitalist that took care of you is not available. Once you are discharged, your primary care physician will handle any further medical issues. Please note that NO REFILLS for any discharge medications will be authorized once you are discharged, as it is imperative that you return to your primary care physician (or establish a relationship with a primary care physician if you do not have one) for your aftercare needs so that they can reassess your need for medications and monitor your lab values. If you do not have a primary care physician, you can call [phone] for a physician referral.   Increase activity slowly   Complete by: As directed         Allergies as of 05/16/2020      Reactions   Sulfamethoxazole-trimethoprim Other (See Comments)   REACTION: unspecified   Sulfur Hives, Itching      Medication List    STOP taking these medications   buprenorphine 5 MCG/HR Ptwk Commonly known as: BUTRANS     TAKE these medications   albuterol 108 (90 Base) MCG/ACT inhaler Commonly known as: VENTOLIN HFA Inhale 2 puffs into the lungs every 6 (six) hours as needed.   alendronate 70 MG tablet Commonly known as: FOSAMAX Take 1 tablet (70 mg total) by mouth every 7 (seven) days. Take with a full glass of water on  an empty stomach.   azithromycin 500 MG tablet Commonly known as: ZITHROMAX Take 1 tablet (500 mg total) by mouth daily for 4 days.   benazepril 20 MG tablet Commonly known as: LOTENSIN TAKE 1 TABLET(20 MG) BY MOUTH DAILY   benzonatate 100 MG capsule Commonly known as: Tessalon Perles Take 1 capsule (100 mg total) by mouth 3 (three) times daily as needed for  cough.   butalbital-acetaminophen-caffeine 50-325-40-30 MG capsule Commonly known as: FIORICET WITH CODEINE Take 1 capsule by mouth every 4 (four) hours as needed for headache. Rescue   carvedilol 25 MG tablet Commonly known as: COREG TAKE 1 TABLET(25 MG) BY MOUTH TWICE DAILY WITH A MEAL   cefdinir 300 MG capsule Commonly known as: OMNICEF Take 1 capsule (300 mg total) by mouth every 12 (twelve) hours for 4 days.   cloNIDine 0.1 MG tablet Commonly known as: CATAPRES TAKE 1 TABLET(0.1 MG) BY MOUTH TWICE DAILY   conjugated estrogens vaginal cream Commonly known as: PREMARIN Place 1 Applicatorful vaginally 2 (two) times a week.   fluticasone 50 MCG/ACT nasal spray Commonly known as: FLONASE Place 2 sprays into both nostrils as needed for allergies or rhinitis.   furosemide 20 MG tablet Commonly known as: LASIX TAKE 1 TABLET(20 MG) BY MOUTH DAILY   lamoTRIgine 100 MG tablet Commonly known as: LaMICtal Take 1 tablet (100 mg total) by mouth 2 (two) times daily.   MULTIVITAMIN ADULT PO Take 1 tablet by mouth.   omeprazole 20 MG capsule Commonly known as: PRILOSEC Take 1 capsule (20 mg total) by mouth daily.   ondansetron 4 MG disintegrating tablet Commonly known as: ZOFRAN-ODT Take 1-2 tablets (4-8 mg total) by mouth every 8 (eight) hours as needed for nausea.   OVER THE COUNTER MEDICATION OTC eye drops   TYLENOL PO Take 2 tablets by mouth as needed.         Follow-up Information    [name], [name], MD. Schedule an appointment as soon as possible for a visit in 1 week(s).   Specialties: Internal  Medicine, Pediatrics Contact information: [place] [address] [phone]               TOTAL DISCHARGE TIME: 46 minutes  [place]  [facility] Pager on [email or web address]  05/17/2020, 11:54 AM

## 2020-05-16 NOTE — ED Provider Notes (Signed)
[place] DEPT Provider Note   CSN: [id number] Arrival date & time: 05/15/20  2233     History No chief complaint on file.   [name] is a 79 y.o. female presenting for evaluation of nausea and abdominal pain.  Patient states she had mild nausea that began today, resolved when she took Zofran. Just prior to eating, she developed pain in the left side of her abdomen, this worsened significantly while eating. She has had persistent and continually worsening pain in the left side of her abdomen. She took Tylenol about 3 hours prior to arrival, has not taken anything else. She denies fevers, chills, chest pain, shortness of breath, cough, urinary symptoms, abnormal bowel movements. She does not know if she has a history of diverticulosis. Pain does not change with bowel movements or urination. Patient states she has been taking Aleve 3 times a day for the past several weeks due to back pain. No other recent change in medications.  Additional history taken chart reviewed. Patient with a history of GERD, hypertension, fatty liver, colitis, trigeminal neuralgia. Per last colonoscopy, patient had extensive diverticulosis in 2015. Abdominal surgeries include cholecystectomy, abdominal hysterectomy  HPI     Past Medical History:  Diagnosis Date  . Abdominal pain 05/29/2013   s/p rx of cephalo resistant e coli   but last rx NG  now residular ?  bladder sx repeat cx sx rx to ty and uro consult   . ADJ DISORDER WITH MIXED ANXIETY & DEPRESSED MOOD 03/03/2010   Qualifier: Diagnosis of  By: [name], [name]   . Agent resistant to multiple antibiotics 05/29/2013   e coli   bu NG on fu.    . ARF (acute renal failure) ([facility]) 03/12/2015  . Closed head injury 02/01/2011   from syncope and had scalp laceration  neg ct .    Marland Kitchen Closed head injury 5-6 yrs ago  . Colitis 11/27/2017  . Complication of anesthesia    migraine several hours after general anesthesia  .  Fatty liver   . Gall stones 2016   see ct scan neg HIDA   . GERD (gastroesophageal reflux disease)   . Hearing aid worn   . HOH (hard of hearing)    both ears  . Hyperlipidemia   . Hypertension    echo nl lv function  mild dilitation 2009  . Kidney infection    few yrs ago in hospital  . Medication side effect 09/02/2010   Poss muscle se of 10 crestor   . Migraine    hypnic HA eval by Dr. Earley Favor in the past  . Polycythemia   . 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 Pyelonephritis 03/12/2015  . 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 Syncope 02/01/2011   In shower on vacation  sustained head laceration  8 sutures Had ed visit neg head ct labs and x ray   . Trigeminal neuralgia pain     Patient Active Problem List   Diagnosis Date Noted  . Abdominal pain 05/16/2020  . 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 Radiculopathy due to lumbar intervertebral disc disorder 03/27/2019  . 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 Diarrhea 11/27/2017  . Unilateral headache 05/26/2015  . Gallstones 03/12/2015  . Nausea without vomiting 12/10/2014  . Essential hypertension 05/29/2014  . 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 Low back pain radiating to  right leg 01/16/2013  . Sinus problem 11/20/2012  . Chronic headaches morning 10/19/2012  . Low sodium levels  133 10/19/2012  . Leg pain, posterior 08/16/2012  . Sciatic neuritis 08/16/2012  . Sleep disturbance, unspecified 08/16/2012  . Medication management 03/24/2012  . Preventative health care 09/24/2011  . Trigeminal neuralgia 09/24/2011  . Postmenopausal HRT (hormone replacement therapy) 09/24/2011  . Polycythemia 09/20/2011  . Morning headache 02/01/2011  . Hearing aid worn   . Abnormal blood finding   . LOCALIZED SUPERFICIAL SWELLING MASS OR LUMP 09/21/2009  . DYSPNEA 05/07/2008  . Hypnic headache 03/19/2008  . Cholelithiasis with  chronic cholecystitis 03/19/2008  . Headache(784.0) 06/05/2007  . JAW PAIN 04/06/2007  . Osteoarthritis 04/06/2007  . ABNORMAL RESULT, FUNCTION STUDY, Zeigler 02/05/2007  . HYPERLIPIDEMIA 12/25/2006  . HYPERTENSION 12/25/2006    Past Surgical History:  Procedure Laterality Date  . ABDOMINAL HYSTERECTOMY  2002   tubal  . CARDIAC CATHETERIZATION  2000   chest pains neg  . CHOLECYSTECTOMY N/A 02/21/2017   Procedure: LAPAROSCOPIC CHOLECYSTECTOMY WITH INTRAOPERATIVE CHOLANGIOGRAM;  Surgeon: Armandina Gemma, MD;  Location: [facility];  Service: General;  Laterality: N/A;  . COLONOSCOPY     multiple  . CRANIOTOMY  12/09/2011   nerve decompression right trigeminal   . DOPPLER ECHOCARDIOGRAPHY  2009   nl lv function mild lv dilitation  . EYE SURGERY Bilateral    ioc for catatracts  . laparoscopic gallbladder surgery  02/16/2017   Fax from [facility]  . OOPHORECTOMY Bilateral 2002  . rt shoulder surgery       OB History    Gravida  2   Para  2   Term      Preterm      AB      Living        SAB      IAB      Ectopic      Multiple      Live Births              Family History  Problem Relation Age of Onset  . Ovarian cancer Mother   . Stroke Mother   . Alcohol abuse Father   . Stroke Father   . Diabetes Brother   . Cancer Paternal Aunt        leukemia, unknown type  . Seizures Daughter   . Hypertension Other   . Colon cancer Neg Hx     Social History   Tobacco Use  . Smoking status: Never Smoker  . Smokeless tobacco: Never Used  Vaping Use  . Vaping Use: Never used  Substance Use Topics  . Alcohol use: Yes    Alcohol/week: 2.0 standard drinks    Types: 2 Glasses of wine per week    Comment: occ wine  . Drug use: No    Home Medications Prior to Admission medications   Medication Sig Start Date End Date Taking? Authorizing Provider  alendronate (FOSAMAX) 70 MG tablet Take 1 tablet (70 mg total) by mouth every 7 (seven) days. Take  with a full glass of water on an empty stomach. 06/14/19  Yes [name], [name], MD  benazepril (LOTENSIN) 20 MG tablet TAKE 1 TABLET(20 MG) BY MOUTH DAILY 02/04/20  Yes [name], [name], MD  carvedilol (COREG) 25 MG tablet TAKE 1 TABLET(25 MG) BY MOUTH TWICE DAILY WITH A MEAL 02/29/20  Yes [name], [name], MD  cloNIDine (CATAPRES) 0.1 MG tablet TAKE 1 TABLET(0.1 MG) BY MOUTH TWICE  DAILY 03/30/20  Yes [name], [name], MD  Acetaminophen (TYLENOL PO) Take 2 tablets by mouth as needed.    [provider]  albuterol (PROVENTIL HFA;VENTOLIN HFA) 108 (90 Base) MCG/ACT inhaler Inhale 2 puffs into the lungs every 6 (six) hours as needed. 04/26/16   [name], [name], MD  butalbital-acetaminophen-caffeine (FIORICET WITH CODEINE) [phone] MG capsule Take 1 capsule by mouth every 4 (four) hours as needed for headache. Rescue Patient not taking: Reported on 05/16/2020 04/22/20   [name], MD  conjugated estrogens (PREMARIN) vaginal cream Place 1 Applicatorful vaginally 2 (two) times a week.    [provider]  fluticasone (FLONASE) 50 MCG/ACT nasal spray Place 2 sprays into both nostrils as needed for allergies or rhinitis. 02/11/20   [name], [name], MD  furosemide (LASIX) 20 MG tablet TAKE 1 TABLET(20 MG) BY MOUTH DAILY 03/30/20   [name], [name], MD  lamoTRIgine (LAMICTAL) 100 MG tablet Take 1 tablet (100 mg total) by mouth 2 (two) times daily. 05/11/20   [name], MD  Multiple Vitamin (MULTIVITAMIN ADULT PO) Take 1 tablet by mouth.    [provider]  omeprazole (PRILOSEC) 20 MG capsule Take 1 capsule (20 mg total) by mouth daily. 03/17/20   [name], [name], MD  ondansetron (ZOFRAN-ODT) 4 MG disintegrating tablet Take 1-2 tablets (4-8 mg total) by mouth every 8 (eight) hours as needed for nausea. 09/04/19   [name], MD  OVER THE COUNTER MEDICATION OTC eye drops    [provider]    Allergies    Sulfamethoxazole-trimethoprim and Sulfur  Review of Systems    Review of Systems  Respiratory: Positive for cough.   Gastrointestinal: Positive for abdominal pain and nausea.  All other systems reviewed and are negative.   Physical Exam Updated Vital Signs BP (!) 168/90   Pulse 73   Temp 98.1 F (36.7 C) (Oral)   Resp 12   Ht 5' 6"  (1.676 m)   Wt 55 kg   SpO2 97%   BMI 19.57 kg/m   Physical Exam Vitals and nursing note reviewed.  Constitutional:      General: She is not in acute distress.    Appearance: She is well-developed and well-nourished.     Comments: Appears nontoxic  HENT:     Head: Normocephalic and atraumatic.  Eyes:     Extraocular Movements: EOM normal.     Conjunctiva/sclera: Conjunctivae normal.     Pupils: Pupils are equal, round, and reactive to light.  Cardiovascular:     Rate and Rhythm: Normal rate and regular rhythm.     Pulses: Normal pulses and intact distal pulses.  Pulmonary:     Effort: Pulmonary effort is normal. No respiratory distress.     Breath sounds: Normal breath sounds. No wheezing.     Comments: Clear lung sounds Abdominal:     General: There is no distension.     Palpations: Abdomen is soft. There is no mass.     Tenderness: There is abdominal tenderness. There is guarding. There is no rebound.     Comments: Tenderness palpation of left lower quadrant abdomen with voluntary guarding.  No rigidity or distention.  Negative rebound.  No mass or contusion  Musculoskeletal:        General: Normal range of motion.     Cervical back: Normal range of motion and neck supple.  Skin:    General: Skin is warm and dry.     Capillary Refill:  Capillary refill takes less than 2 seconds.  Neurological:     Mental Status: She is alert and oriented to person, place, and time.  Psychiatric:        Mood and Affect: Mood and affect normal.     ED Results / Procedures / Treatments   Labs (all labs ordered are listed, but only abnormal results are displayed) Labs Reviewed  COMPREHENSIVE METABOLIC PANEL  - Abnormal; Notable for the following components:      Result Value   Total Protein 6.0 (*)    Albumin 3.2 (*)    All other components within normal limits  URINALYSIS, ROUTINE W REFLEX MICROSCOPIC - Abnormal; Notable for the following components:   Ketones, ur 5 (*)    All other components within normal limits  URINE CULTURE  RESP PANEL BY RT-PCR (FLU A&B, COVID) ARPGX2  LIPASE, BLOOD  CBC    EKG None  Radiology CT ABDOMEN PELVIS W CONTRAST  Result Date: 05/16/2020 CLINICAL DATA:  Left upper quadrant pain. EXAM: CT ABDOMEN AND PELVIS WITH CONTRAST TECHNIQUE: Multidetector CT imaging of the abdomen and pelvis was performed using the standard protocol following bolus administration of intravenous contrast. CONTRAST:  189m OMNIPAQUE IOHEXOL 300 MG/ML  SOLN COMPARISON:  November 27, 2017 FINDINGS: Lower chest: Mild infiltrate is seen within the left lung base. A very small left pleural effusion is also noted. Hepatobiliary: No focal liver abnormality is seen. Status post cholecystectomy. No biliary dilatation. Pancreas: Unremarkable. No pancreatic ductal dilatation or surrounding inflammatory changes. Spleen: Normal in size without focal abnormality. Adrenals/Urinary Tract: Adrenal glands are unremarkable. Kidneys are normal in size, without renal calculi or hydronephrosis. Stable bilateral simple renal cysts are seen. Bladder is unremarkable. Stomach/Bowel: Stomach is within normal limits. The appendix is not clearly identified. No evidence of bowel wall thickening, distention, or inflammatory changes. Noninflamed diverticula are seen throughout the large bowel. Vascular/Lymphatic: Aortic atherosclerosis. No enlarged abdominal or pelvic lymph nodes. Reproductive: Status post hysterectomy. No adnexal masses. Other: Moderate to marked severity soft tissue swelling is seen within the lateral aspect of the abdominal and pelvic wall on the left. An associated 5.2 cm x 3.4 cm x 6.9 cm isodense area is noted  within the musculature. A thin curvilinear area of contrast enhancement is seen within this region (axial CT images 36 through 40/CT series #2 and sagittal reformatted image 149, CT series #6). A mild amount of para muscular inflammatory fat stranding is also present. Musculoskeletal: No acute or significant osseous findings. IMPRESSION: 1. Evolving hematoma within the lateral aspect of the abdominal and pelvic wall on the left, with additional findings suggestive of active bleeding. 2. Mild left basilar infiltrate with a very small left pleural effusion. 3. Colonic diverticulosis. 4. Stable bilateral simple renal cysts. 5. Aortic atherosclerosis. Aortic Atherosclerosis (ICD10-I70.0). Electronically Signed   By: TVirgina NorfolkM.D.   On: 05/16/2020 00:49    Procedures Procedures (including critical care time)  Medications Ordered in ED Medications  cefTRIAXone (ROCEPHIN) 1 g in sodium chloride 0.9 % 100 mL IVPB (1 g Intravenous New Bag/Given 05/16/20 0148)  azithromycin (ZITHROMAX) 500 mg in sodium chloride 0.9 % 250 mL IVPB (has no administration in time range)  lamoTRIgine (LAMICTAL) tablet 100 mg (100 mg Oral Given 05/15/20 2329)  iohexol (OMNIPAQUE) 300 MG/ML solution 100 mL (100 mLs Intravenous Contrast Given 05/16/20 0024)    ED Course  I have reviewed the triage vital signs and the nursing notes.  Pertinent labs & imaging results that were  available during my care of the patient were reviewed by me and considered in my medical decision making (see chart for details).    MDM Rules/Calculators/A&P                          Pt presenting for evaluation of abd pain. On exam, pt appears nontoxic.  Vitals are stable.  No fevers or chills.  However, patient with associated nausea, and left lower quadrant tenderness on exam.  She did have some mild diarrhea 3 days ago.  Consider diverticulitis.  Also consider kidney stone.  Consider UTI.  Patient has had increased NSAID use, consider PUD.   She will need labs, urine, and CT abdomen pelvis for further evaluation.  Labs interpreted by me, overall reassuring.  No leukocytosis.  Electrolytes stable.  Hemoglobin stable.  Urine without signs of infection.  CT abdomen pelvis shows abdominal wall hematoma with active expansion.  Additionally, shows left lower lobe pneumonia.  Case discussed with attending, Dr. Tomi Bamberger evaluated the patient.  Patient does report a mild cough at this time.  Will treat for pneumonia.  As patient has active bleeding, and due to her advanced age, she will need to be admitted for observation and stabilization.  Discussed with Dr. Tobie Poet from [facility] hospitalist service, patient to be admitted.  Final Clinical Impression(s) / ED Diagnoses Final diagnoses:  Abdominal wall hematoma, initial encounter  Community acquired pneumonia of left lower lobe of lung    Rx / DC Orders ED Discharge Orders    None       Franchot Heidelberg, PA-C 05/16/20 0209    Rolland Porter, MD 05/16/20 [phone]

## 2020-05-16 NOTE — ED Notes (Signed)
Patient ambulated next to bed for 94 seconds.  Her O2 saturation was 96% when she stood up and in the first 15 seconds of ambulation her O2 saturation went down to 94% however it then went up to 100% after approximately 30 seconds and then went down to 95% for the rest of the ambulation.

## 2020-05-16 NOTE — H&P (Signed)
History and Physical   [name] MQK:[id number] DOB: 1941/04/10 DOA: 05/15/2020  PCP: [name], MD  Outpatient Specialists: Dr. Sarina Ill, neurosurgery Patient coming from: home via EMS  I have personally briefly reviewed patient's old medical records in [place].  Chief Concern: abdominal pain  HPI: [name] is a 79 y.o. female with medical history significant for hypertension, GERD, right leg sciatica, trigeminal neuralgia, presented to the ED for chief concern of abdominal pain that started on day of presentaiton 05/15/20.   The abdominal pain started approximately 5:30 PM, described as sharp, 7-8/10 at its peak, worse with palpation, straining, and coughing. She reports the pain is on the left side. She denies trauma in the last two weeks.   She reports the pain is improved when she is not straining and/or coughing. She endorses a cough that started about two weeks ago, productive, thick dark green mucus. She denies fever at home.   She endorses nausea with taking home fioricet. She endorses one loose stool approximately three days ago that was brown.   ROS:  Constitutional: No Weight Change, No Fever ENT/Mouth: No sore throat, No Rhinorrhea Eyes: No Eye Pain, No Vision Changes Cardiovascular: No Chest Pain, no SOB,  No Edema, No Palpitations. She endorsing swelling in her legs with prolonged standing Respiratory: + Cough, + Sputum Gastrointestinal: + Nausea, No Vomiting, No Diarrhea Genitourinary: no Urinary Incontinence, dysuria, hematuria Musculoskeletal: No Arthralgias, No Myalgias Skin: No Skin Lesions, No Pruritus, Neuro: + Weakness, No Loss of Consciousness, No Syncope Psych: No Anxiety/Panic, No Depression, - decrease appetite Heme/Lymph: No Bruising, No Bleeding  ED Course: Discussed with ED provider, patient requiring admission for evolving hematoma within the lateral aspect of the abdominal and pelvic wall on the left,  suspected for active bleeding, left lower lobe pneumonia.   Assessment/Plan  Principal Problem:   Hematoma of abdominal wall, initial encounter Active Problems:   Osteoarthritis   Trigeminal neuralgia   Essential hypertension   Abdominal pain   Left abdominal pain Evolving left abdominal and pelvic wall hematoma - patient is hemodynamically stable and hemoglobin/hemtocrit is stable  - Admit to observation with telemetry - CBC in the AM - No indication for blood transfusion at this time - AM team to consult general surgery - Patient has had surgery with Dr. Armandina Gemma and if surgery is needed and given the preference she would like to have him consulted   CAP - ceftriaxone and azithromycin per ED provider - Cetriaxone 1 g IV daily and azithromycin 500 mg IV daily continued - CXR portable ordered  Hypertension - elevated - Home antihypertensives consists: carvedilol 25 mg BID, benazepril 20 mg daily, furosemide 20 mg daily, clonidine 0.1 mg BID - These have been resumed  Trigeminal neuralgia - lamotrigine 100 mg BID PO daily resumed  Headaches - fioricet 50-325-30 mg q4h prn for headaches however patient no longer wishes to take this medication due to nausea - acetaminophen 325 mg PO prn  COVID-19 incidental finding and patient is currently not exhibiting symptoms of shortness of breath and/or requiring supplementation oxygen - Recommend outpatient referral MAB therapy (bamlanivimab plus etesevimab, sotrovimab, or casirvimab plus imdevimab) - Portable cxr ordered  Chart reviewed.   DVT prophylaxis: ted hose Code Status: DNR Diet: NPO Family Communication: updated spouse at bedside Disposition Plan: pending clinical course Consults called: not at this time, patient may benefit from gen sx in the AM Admission status: observation with telemetry   Past Medical  History:  Diagnosis Date  . Abdominal pain 05/29/2013   s/p rx of cephalo resistant e coli   but last rx NG  now  residular ?  bladder sx repeat cx sx rx to ty and uro consult   . ADJ DISORDER WITH MIXED ANXIETY & DEPRESSED MOOD 03/03/2010   Qualifier: Diagnosis of  By: [name], [name]   . Agent resistant to multiple antibiotics 05/29/2013   e coli   bu NG on fu.    . ARF (acute renal failure) ([place]) 03/12/2015  . Closed head injury 02/01/2011   from syncope and had scalp laceration  neg ct .    Marland Kitchen Closed head injury 5-6 yrs ago  . Colitis 11/27/2017  . Complication of anesthesia    migraine several hours after general anesthesia  . Fatty liver   . Gall stones 2016   see ct scan neg HIDA   . GERD (gastroesophageal reflux disease)   . Hearing aid worn   . HOH (hard of hearing)    both ears  . Hyperlipidemia   . Hypertension    echo nl lv function  mild dilitation 2009  . Kidney infection    few yrs ago in hospital  . Medication side effect 09/02/2010   Poss muscle se of 10 crestor   . Migraine    hypnic HA eval by Dr. Earley Favor in the past  . Polycythemia   . 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 Pyelonephritis 03/12/2015  . 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 Syncope 02/01/2011   In shower on vacation  sustained head laceration  8 sutures Had ed visit neg head ct labs and x ray   . Trigeminal neuralgia pain    Past Surgical History:  Procedure Laterality Date  . ABDOMINAL HYSTERECTOMY  2002   tubal  . CARDIAC CATHETERIZATION  2000   chest pains neg  . CHOLECYSTECTOMY N/A 02/21/2017   Procedure: LAPAROSCOPIC CHOLECYSTECTOMY WITH INTRAOPERATIVE CHOLANGIOGRAM;  Surgeon: Armandina Gemma, MD;  Location: [facility];  Service: General;  Laterality: N/A;  . COLONOSCOPY     multiple  . CRANIOTOMY  12/09/2011   nerve decompression right trigeminal   . DOPPLER ECHOCARDIOGRAPHY  2009   nl lv function mild lv dilitation  . EYE SURGERY Bilateral    ioc for catatracts  . laparoscopic gallbladder surgery  02/16/2017   Fax from [facility]  . OOPHORECTOMY Bilateral  2002  . rt shoulder surgery     Social History:  reports that she has never smoked. She has never used smokeless tobacco. She reports current alcohol use of about 2.0 standard drinks of alcohol per week. She reports that she does not use drugs.  Allergies  Allergen Reactions  . Sulfamethoxazole-Trimethoprim Other (See Comments)    REACTION: unspecified  . Sulfur Hives and Itching   Family History  Problem Relation Age of Onset  . Ovarian cancer Mother   . Stroke Mother   . Alcohol abuse Father   . Stroke Father   . Diabetes Brother   . Cancer Paternal Aunt        leukemia, unknown type  . Seizures Daughter   . Hypertension Other   . Colon cancer Neg Hx    Family history: Family history reviewed and not pertinent  Prior to Admission medications   Medication Sig Start Date End Date Taking? Authorizing Provider  alendronate (FOSAMAX) 70 MG tablet Take 1 tablet (70  mg total) by mouth every 7 (seven) days. Take with a full glass of water on an empty stomach. 06/14/19  Yes [name], [name], MD  Acetaminophen (TYLENOL PO) Take 2 tablets by mouth as needed.    [provider]  albuterol (PROVENTIL HFA;VENTOLIN HFA) 108 (90 Base) MCG/ACT inhaler Inhale 2 puffs into the lungs every 6 (six) hours as needed. 04/26/16   [name], [name], MD  benazepril (LOTENSIN) 20 MG tablet TAKE 1 TABLET(20 MG) BY MOUTH DAILY 02/04/20   [name], [name], MD  butalbital-acetaminophen-caffeine (FIORICET WITH CODEINE) [phone] MG capsule Take 1 capsule by mouth every 4 (four) hours as needed for headache. Rescue 04/22/20   [name], [name], MD  carvedilol (COREG) 25 MG tablet TAKE 1 TABLET(25 MG) BY MOUTH TWICE DAILY WITH A MEAL 02/29/20   [name], [name], MD  cloNIDine (CATAPRES) 0.1 MG tablet TAKE 1 TABLET(0.1 MG) BY MOUTH TWICE DAILY 03/30/20   [name], [name], MD  conjugated estrogens (PREMARIN) vaginal cream Place 1 Applicatorful vaginally 2 (two) times a week.    [provider]  fluticasone  (FLONASE) 50 MCG/ACT nasal spray Place 2 sprays into both nostrils as needed for allergies or rhinitis. 02/11/20   [name], [name], MD  furosemide (LASIX) 20 MG tablet TAKE 1 TABLET(20 MG) BY MOUTH DAILY 03/30/20   [name], [name], MD  lamoTRIgine (LAMICTAL) 100 MG tablet Take 1 tablet (100 mg total) by mouth 2 (two) times daily. 05/11/20   [name], MD  Multiple Vitamin (MULTIVITAMIN ADULT PO) Take 1 tablet by mouth.    [provider]  omeprazole (PRILOSEC) 20 MG capsule Take 1 capsule (20 mg total) by mouth daily. 03/17/20   [name], [name], MD  ondansetron (ZOFRAN-ODT) 4 MG disintegrating tablet Take 1-2 tablets (4-8 mg total) by mouth every 8 (eight) hours as needed for nausea. 09/04/19   [name], MD  OVER THE COUNTER MEDICATION OTC eye drops    [provider]   Physical Exam: Vitals:   05/15/20 2240 05/15/20 2245 05/16/20 0000 05/16/20 0113  BP: (!) 193/94  (!) 171/91 (!) 168/90  Pulse: 61  66 73  Resp: 18  20 12   Temp: 98.1 F (36.7 C)     TempSrc: Oral     SpO2: 97%  94% 97%  Weight:  55 kg    Height:  5' 6"  (1.676 m)     Constitutional: appears younger than chronological age, NAD, calm, comfortable Eyes: PERRL, lids and conjunctivae normal ENMT: Mucous membranes are moist. Posterior pharynx clear of any exudate or lesions. Age-appropriate dentition. Hearing appropriate Neck: normal, supple, no masses, no thyromegaly Respiratory: clear to auscultation bilaterally, no wheezing, no crackles. Normal respiratory effort. No accessory muscle use.  Cardiovascular: Regular rate and rhythm, no murmurs / rubs / gallops. No extremity edema. 2+ pedal pulses. No carotid bruits.  Abdomen: left lower abdominal tenderness, no masses palpated, no hepatosplenomegaly. Bowel sounds positive.  Musculoskeletal: no clubbing / cyanosis. No joint deformity upper and lower extremities. Good ROM, no contractures, no atrophy. Normal muscle tone.  Skin: no rashes, lesions,  ulcers. No induration Neurologic: Sensation intact. Strength 5/5 in all 4.  Psychiatric: Normal judgment and insight. Alert and oriented x 3. Normal mood.   EKG: I personally interpreted the EKG and it is normal sinus rhythm with rate of 76, qtc 433, LVH, and appropriate R-wave progression.  CT abd/pelv with constrast on Admission: Personally reviewed and I agree with radiologist reading as  below.  CT ABDOMEN PELVIS W CONTRAST  Result Date: 05/16/2020 CLINICAL DATA:  Left upper quadrant pain. EXAM: CT ABDOMEN AND PELVIS WITH CONTRAST TECHNIQUE: Multidetector CT imaging of the abdomen and pelvis was performed using the standard protocol following bolus administration of intravenous contrast. CONTRAST:  152m OMNIPAQUE IOHEXOL 300 MG/ML  SOLN COMPARISON:  November 27, 2017 FINDINGS: Lower chest: Mild infiltrate is seen within the left lung base. A very small left pleural effusion is also noted. Hepatobiliary: No focal liver abnormality is seen. Status post cholecystectomy. No biliary dilatation. Pancreas: Unremarkable. No pancreatic ductal dilatation or surrounding inflammatory changes. Spleen: Normal in size without focal abnormality. Adrenals/Urinary Tract: Adrenal glands are unremarkable. Kidneys are normal in size, without renal calculi or hydronephrosis. Stable bilateral simple renal cysts are seen. Bladder is unremarkable. Stomach/Bowel: Stomach is within normal limits. The appendix is not clearly identified. No evidence of bowel wall thickening, distention, or inflammatory changes. Noninflamed diverticula are seen throughout the large bowel. Vascular/Lymphatic: Aortic atherosclerosis. No enlarged abdominal or pelvic lymph nodes. Reproductive: Status post hysterectomy. No adnexal masses. Other: Moderate to marked severity soft tissue swelling is seen within the lateral aspect of the abdominal and pelvic wall on the left. An associated 5.2 cm x 3.4 cm x 6.9 cm isodense area is noted within the musculature.  A thin curvilinear area of contrast enhancement is seen within this region (axial CT images 36 through 40/CT series #2 and sagittal reformatted image 149, CT series #6). A mild amount of para muscular inflammatory fat stranding is also present. Musculoskeletal: No acute or significant osseous findings. IMPRESSION: 1. Evolving hematoma within the lateral aspect of the abdominal and pelvic wall on the left, with additional findings suggestive of active bleeding. 2. Mild left basilar infiltrate with a very small left pleural effusion. 3. Colonic diverticulosis. 4. Stable bilateral simple renal cysts. 5. Aortic atherosclerosis. Aortic Atherosclerosis (ICD10-I70.0). Electronically Signed   By: TVirgina NorfolkM.D.   On: 05/16/2020 00:49   Labs on Admission: I have personally reviewed following labs  CBC: Recent Labs  Lab 05/11/20 1330 05/15/20 2239  WBC 3.7 7.6  NEUTROABS 2.3  --   HGB 13.7 13.7  HCT 39.7 42.0  MCV 89 93.5  PLT 192 2536  Basic Metabolic Panel: Recent Labs  Lab 05/11/20 1330 05/15/20 2239  NA 140 139  K 4.3 4.0  CL 98 101  CO2 27 26  GLUCOSE 97 96  BUN 18 15  CREATININE 0.87 0.89  CALCIUM 9.3 9.1   GFR: Estimated Creatinine Clearance: 43.8 mL/min (by C-G formula based on SCr of 0.89 mg/dL).  Liver Function Tests: Recent Labs  Lab 05/11/20 1330 05/15/20 2239  AST 18 17  ALT 15 15  ALKPHOS 69 54  BILITOT 0.4 0.9  PROT 6.2 6.0*  ALBUMIN 4.0 3.2*   Recent Labs  Lab 05/15/20 2239  LIPASE 25   Urine analysis:    Component Value Date/Time   COLORURINE YELLOW 05/15/2020 2239   APPEARANCEUR CLEAR 05/15/2020 2239   LABSPEC 1.010 05/15/2020 2239   PHURINE 6.0 05/15/2020 2239   GLUCOSEU NEGATIVE 05/15/2020 2239   GLUCOSEU NEGATIVE 09/10/2019 1444   HGBUR NEGATIVE 05/15/2020 2239   HGBUR negative 02/25/2008 0857   BILIRUBINUR NEGATIVE 05/15/2020 2239   BILIRUBINUR n 10/02/2018 0938   KETONESUR 5 (A) 05/15/2020 2239   PROTEINUR NEGATIVE 05/15/2020 2239    UROBILINOGEN 0.2 09/10/2019 1444   NITRITE NEGATIVE 05/15/2020 2239   LEUKOCYTESUR NEGATIVE 05/15/2020 2239   [name] N Jameah Rouser  D.O. [facility]  If 7PM-7AM, please contact overnight-coverage provider If 7AM-7PM, please contact day coverage provider [email or web address]  05/16/2020, 2:35 AM

## 2020-05-16 NOTE — Discharge Instructions (Signed)
Hematoma A hematoma is a collection of blood. A hematoma can happen:  Under the skin.  In an organ.  In a body space.  In a joint space.  In other tissues. The blood can thicken (clot) to form a lump that you can see and feel. The lump is often hard and may become sore and tender. The lump can be very small or very big. Most hematomas get better in a few days to weeks. However, some hematomas may be serious and need medical care. What are the causes? This condition is caused by:  An injury.  Blood that leaks under the skin.  Problems from surgeries.  Medical conditions that cause bleeding or bruising. What increases the risk? You are more likely to develop this condition if:  You are an older adult.  You use medicines that thin your blood. What are the signs or symptoms? Symptoms depend on where the hematoma is in your body.  If the hematoma is under the skin, there is: ? A firm lump on the body. ? Pain and tenderness in the area. ? Bruising. The skin above the lump may be blue, dark blue, purple-red, or yellowish.  If the hematoma is deep in the tissues or body spaces, there may be: ? Blood in the stomach. This may cause pain in the belly (abdomen), weakness, passing out (fainting), and shortness of breath. ? Blood in the head. This may cause a headache, weakness, trouble speaking or understanding speech, or passing out. How is this diagnosed? This condition is diagnosed based on:  Your medical history.  A physical exam.  Imaging tests, such as ultrasound or CT scan.  Blood tests. How is this treated? Treatment depends on the cause, size, and location of the hematoma. Treatment may include:  Doing nothing. Many hematomas go away on their own without treatment.  Surgery or close monitoring. This may be needed for large hematomas or hematomas that affect the body's organs.  Medicines. These may be given if a medical condition caused the hematoma. Follow these  instructions at home: Managing pain, stiffness, and swelling   If told, put ice on the area. ? Put ice in a plastic bag. ? Place a towel between your skin and the bag. ? Leave the ice on for 20 minutes, 2-3 times a day for the first two days.  If told, put heat on the affected area after putting ice on the area for two days. Use the heat source that your doctor tells you to use. This could be a moist heat pack or a heating pad. To do this: ? Place a towel between your skin and the heat source. ? Leave the heat on for 20-30 minutes. ? Remove the heat if your skin turns bright red. This is very important if you are unable to feel pain, heat, or cold. You may have a greater risk of getting burned.  Raise (elevate) the affected area above the level of your heart while you are sitting or lying down.  Wrap the affected area with an elastic bandage, if told by your doctor. Do not wrap the bandage too tightly.  If your hematoma is on a leg or foot and is painful, your doctor may give you crutches. Use them as told by your doctor. General instructions  Take over-the-counter and prescription medicines only as told by your doctor.  Keep all follow-up visits as told by your doctor. This is important. Contact a doctor if:  You have a fever.  The swelling or bruising gets worse.  You start to get more hematomas. Get help right away if:  Your pain gets worse.  Your pain is not getting better with medicine.  Your skin over the hematoma breaks or starts to bleed.  Your hematoma is in your chest or belly and you: ? Pass out. ? Feel weak. ? Become short of breath.  You have a hematoma on your scalp that is caused by a fall or injury, and you: ? Have a headache that gets worse. ? Have trouble speaking or understanding speech. ? Become less alert or you pass out. Summary  A hematoma is a collection of blood in any part of your body.  Most hematomas get better on their own in a few days  to weeks. Some may need medical care.  Follow instructions from your doctor about how to care for your hematoma.  Contact a doctor if the swelling or bruising gets worse, or if you are short of breath. This information is not intended to replace advice given to you by your health care provider. Make sure you discuss any questions you have with your health care provider. Document Revised: 10/12/2017 Document Reviewed: 10/12/2017 Elsevier Patient Education  2020 [employer].

## 2020-05-16 NOTE — ED Provider Notes (Signed)
12:51 AM radiologist called me her CT report.  When I going to check the patient she states she has had a mild cough.  She denies having any prolonged coughing jags or episodes.  When I asked her where she hurts she points to an area of her left lateral abdomen.  The area is very tender to palpation, I am unable to appreciate a mass however.  There is no bruising seen of the abdominal wall.  CT ABDOMEN PELVIS W CONTRAST  Result Date: 05/16/2020 CLINICAL DATA:  Left upper quadrant pain. EXAM: CT ABDOMEN AND PELVIS WITH CONTRAST TECHNIQUE: Multidetector CT imaging of the abdomen and pelvis was performed using the standard protocol following bolus administration of intravenous contrast. CONTRAST:  180m OMNIPAQUE IOHEXOL 300 MG/ML  SOLN COMPARISON:  November 27, 2017 FINDINGS: Lower chest: Mild infiltrate is seen within the left lung base. A very small left pleural effusion is also noted. Hepatobiliary: No focal liver abnormality is seen. Status post cholecystectomy. No biliary dilatation. Pancreas: Unremarkable. No pancreatic ductal dilatation or surrounding inflammatory changes. Spleen: Normal in size without focal abnormality. Adrenals/Urinary Tract: Adrenal glands are unremarkable. Kidneys are normal in size, without renal calculi or hydronephrosis. Stable bilateral simple renal cysts are seen. Bladder is unremarkable. Stomach/Bowel: Stomach is within normal limits. The appendix is not clearly identified. No evidence of bowel wall thickening, distention, or inflammatory changes. Noninflamed diverticula are seen throughout the large bowel. Vascular/Lymphatic: Aortic atherosclerosis. No enlarged abdominal or pelvic lymph nodes. Reproductive: Status post hysterectomy. No adnexal masses. Other: Moderate to marked severity soft tissue swelling is seen within the lateral aspect of the abdominal and pelvic wall on the left. An associated 5.2 cm x 3.4 cm x 6.9 cm isodense area is noted within the musculature. A thin  curvilinear area of contrast enhancement is seen within this region (axial CT images 36 through 40/CT series #2 and sagittal reformatted image 149, CT series #6). A mild amount of para muscular inflammatory fat stranding is also present. Musculoskeletal: No acute or significant osseous findings. IMPRESSION: 1. Evolving hematoma within the lateral aspect of the abdominal and pelvic wall on the left, with additional findings suggestive of active bleeding. 2. Mild left basilar infiltrate with a very small left pleural effusion. 3. Colonic diverticulosis. 4. Stable bilateral simple renal cysts. 5. Aortic atherosclerosis. Aortic Atherosclerosis (ICD10-I70.0). Electronically Signed   By: TVirgina NorfolkM.D.   On: 05/16/2020 00:49   Medical screening examination/treatment/procedure(s) were conducted as a shared visit with non-physician practitioner(s) and myself.  I personally evaluated the patient during the encounter.     IRolland Porter MD, FBarbette Or MD 05/16/20 [phone]

## 2020-05-16 NOTE — Progress Notes (Signed)
Patient admitted earlier this morning.  H&P reviewed.  Patient seen and examined.  Patient mentions that she feels better.  Pain in the left side of her abdomen and flank seems to be improving.  Denies any nausea vomiting.  Denies any shortness of breath.  Has had cough with greenish expectoration for the past many days.  Her vital signs are stable.  She is saturating normal on room air.  Lungs are clear to auscultation bilaterally with a few crackles at the left base.  No wheezing or rhonchi.   S1-S2 is normal regular no S3-S4.  No rubs or bruit.   Abdomen is soft.  Bruising is noted on the left side.  Noted to be tender in that area.  No rebound rigidity or guarding.  No masses organomegaly Normal range of motion of her lower extremities. Alert and oriented x3.  No focal neurological deficits.  Labs have been reviewed.  Mild drop in hemoglobin noted but still within normal range.  CT scan report reviewed.  Patient here with left-sided abdominal pain likely due to abdominal wall hematoma.  She has had a lot of coughing episodes over the last week or so which could be the reason for her bleeding.  Not on any antiplatelet or anticoagulation.  PT/INR is normal.  She also reports hitting against a piece of furniture at home.  No trauma noted on imaging studies.  Hemoglobin noted to be slightly lower this morning compared to yesterday.  We will recheck it at noon.  Pain control.  Found to have a left lower lobe infiltrate for which she was started on antibacterials.  Patient incidentally noted to be positive for COVID-19.  However she does not have the usual opacity seen with this viral infection.  Likely she has superimposed bacterial infection.  She has mild symptoms in any case.  She is not hypoxic.  No increased work of breathing is noted.  Continue with antibacterials.  Patient with significant comorbidities and has mild infection.  She is a candidate for monoclonal antibody infusion.  Discussed  with patient and her husband and her daughter who is a Engineer, drilling.  They are all agreeable.  This has been ordered.  Her other medical problems seem to be stable.  Patient keen on going home today.  She will be ambulated.  If hemoglobin is stable at noon and if she is able to get the monoclonal antibody infusion without any problems she may be able to go home this afternoon.  We will continue to follow.  Bonnielee Haff 05/16/2020

## 2020-05-17 LAB — URINE CULTURE: Culture: <10000 — AB

## 2020-05-22 ENCOUNTER — Other Ambulatory Visit: Payer: Self-pay | Admitting: Neurology

## 2020-05-22 MED ORDER — LAMOTRIGINE 25 MG PO TABS: 25.0000 mg | ORAL_TABLET | Freq: Two times a day (BID) | ORAL | 3 refills | Status: DC

## 2020-05-24 ENCOUNTER — Other Ambulatory Visit: Payer: Self-pay | Admitting: Internal Medicine

## 2020-05-24 NOTE — Progress Notes (Signed)
Virtual Visit via Telephone Note  I connected with@ on 05/25/20 at 11:00 AM EST by telephone and verified that I am speaking with the correct person using two identifiers.   I discussed the limitations, risks, security and privacy concerns of performing an evaluation and management service by telephone and the limited availability of in person appointments. tThere may be a patient responsible charge related to this service. The patient expressed understanding and agreed to proceed.  Location patient: home Location provider: work or home office Participants present for the call: patient, provider Patient did not have a visit in the prior 7 days to address this/these issue(s).   History of Present Illness: Gregoria Selvy presents today for follow-up of ED visit and positive SARS-CoV-2 testing. Christmas eve she had somewhat acute onset of lower abdominal pain that took her to the ER.  She states that she tends to get a significant coughing illness every winter and this was one of them without associated fever or shortness of breath. In the ED evaluation showed by CT abd wall hematoma felt  Poss from minor trauma anc severe coughing   Left basilar inflitrate  By ct  Done 12 24 21   Positive sars cov2 at that time  And had mcaby infusion  But patient and clinician felt that pulm inflitrate was bacterial and given  Antibiotics   Had been immunized but no booster yet.  States her cough is pretty much gone no shortness of breath fever.  She finished both antibiotics. Abdominal area is just sore and getting better.  She does state that her blood pressures been running a little higher recently it was up in the emergency room but had been stabilized and okayed by cardiology. She has had some increasing ankle swelling a bit recently still on her Lasix. Only dosage change besides the antibiotic was increase in the Lamictal to 125 twice daily for her [name] pain  Observations/Objective: Patient  sounds personable and well on the phone. I do not appreciate any SOB. Speech and thought processing are grossly intact. Patient reported vitals: Lab Results  Component Value Date   WBC 6.5 05/16/2020   HGB 12.7 05/16/2020   HCT 38.2 05/16/2020   PLT 203 05/16/2020   GLUCOSE 105 (H) 05/16/2020   CHOL 279 (H) 05/27/2019   TRIG 78.0 05/27/2019   HDL 118.80 05/27/2019   LDLDIRECT 135.9 10/16/2012   LDLCALC 145 (H) 05/27/2019   ALT 15 05/15/2020   AST 17 05/15/2020   NA 139 05/16/2020   K 3.5 05/16/2020   CL 102 05/16/2020   CREATININE 0.80 05/16/2020   BUN 12 05/16/2020   CO2 26 05/16/2020   TSH 0.54 05/27/2019   INR 1.1 05/16/2020   HGBA1C 5.5 05/25/2018    Assessment and Plan: Lab test positive for detection of COVID-19 virus - not felt to be cause of resp sx  had primary vaccine series   Essential hypertension - had been controlled plan send in fu readings and assess of leg swelling  Medication management  Lower respiratory infection (e.g., bronchitis, pneumonia, pneumonitis, pulmonitis) resolving - presumed bacterial( non covid)  better  symptomatically after treatment  Abdominal wall hematoma, sequela - felt from coughing  and resolving and now felt better     Follow Up Instructions:  Plan continued convalescent she is doing much better check blood pressure readings twice a day for a few days to assess control and give [place] a status report on her feet swelling. Consideration of adding  extra dose of Lasix if needed. But would have things settle down after her episodes of illness.  Change in medicine and antibiotic   [id number] 5-10 [id number] 11-20 [id number] 21-30 I did not refer this patient for an OV in the next 24 hours for this/these issue(s).  I discussed the assessment and treatment plan with the patient. The patient was provided an opportunity to ask questions and answered. The patient agreed with the plan and demonstrated an understanding of the instructions.   The  patient was advised to call back or seek an in-person evaluation if the symptoms worsen or if the condition fails to improve as anticipated.  I provided 14 minutes of non-face-to-face time during this encounter. Return for bp readings and  edema status in future .  [name], MD

## 2020-05-25 ENCOUNTER — Telehealth (INDEPENDENT_AMBULATORY_CARE_PROVIDER_SITE_OTHER): Payer: Medicare Other | Admitting: Internal Medicine

## 2020-05-25 ENCOUNTER — Encounter: Payer: Self-pay | Admitting: Internal Medicine

## 2020-05-25 VITALS — Ht 66.0 in | Wt 120.0 lb

## 2020-05-25 DIAGNOSIS — I10 Essential (primary) hypertension: Secondary | ICD-10-CM | POA: Diagnosis not present

## 2020-05-25 DIAGNOSIS — Z79899 Other long term (current) drug therapy: Secondary | ICD-10-CM

## 2020-05-25 DIAGNOSIS — U071 COVID-19: Secondary | ICD-10-CM

## 2020-05-25 DIAGNOSIS — S301XXS Contusion of abdominal wall, sequela: Secondary | ICD-10-CM

## 2020-05-25 DIAGNOSIS — J22 Unspecified acute lower respiratory infection: Secondary | ICD-10-CM

## 2020-06-03 DIAGNOSIS — M5416 Radiculopathy, lumbar region: Secondary | ICD-10-CM | POA: Diagnosis not present

## 2020-06-03 DIAGNOSIS — M5441 Lumbago with sciatica, right side: Secondary | ICD-10-CM | POA: Diagnosis not present

## 2020-06-03 DIAGNOSIS — M545 Low back pain, unspecified: Secondary | ICD-10-CM | POA: Diagnosis not present

## 2020-06-03 DIAGNOSIS — G500 Trigeminal neuralgia: Secondary | ICD-10-CM | POA: Diagnosis not present

## 2020-06-03 NOTE — Telephone Encounter (Signed)
Cannot tell why you have swelling  Medicines can    Effect or fluid retention;  My  Recall is that this happened before and you saw cardiology  But  At that time your edema was better   ( oin fall  Had been given some lasix)  Ask neurology if they think the increase dose  lamictal could aggravate the swelling   We can try a 10 - 14 days  of  Diuretic   And follow  Fuosemide 20 mg 1 po qd   Disp 30 no refills   Can you take a picture and place  In the message  Also do daily weights  And record  Give [place] update on Blood pressure   any shortness of breath   Send in  Information in 10 - 14 days after beginning  Lasix   Lab Results  Component Value Date   WBC 6.5 05/16/2020   HGB 12.7 05/16/2020   HCT 38.2 05/16/2020   PLT 203 05/16/2020   GLUCOSE 105 (H) 05/16/2020   CHOL 279 (H) 05/27/2019   TRIG 78.0 05/27/2019   HDL 118.80 05/27/2019   LDLDIRECT 135.9 10/16/2012   LDLCALC 145 (H) 05/27/2019   ALT 15 05/15/2020   AST 17 05/15/2020   NA 139 05/16/2020   K 3.5 05/16/2020   CL 102 05/16/2020   CREATININE 0.80 05/16/2020   BUN 12 05/16/2020   CO2 26 05/16/2020   TSH 0.54 05/27/2019   INR 1.1 05/16/2020   HGBA1C 5.5 05/25/2018

## 2020-06-04 ENCOUNTER — Telehealth: Payer: Self-pay

## 2020-06-04 NOTE — Telephone Encounter (Signed)
See previous My Chart message from 06/03/2020.   Furosemide 34m prescription was sent to pharmacy on 03/30/2021,  30 tabs with 3 refills

## 2020-06-08 ENCOUNTER — Ambulatory Visit: Payer: Medicare Other | Admitting: Neurology

## 2020-06-11 ENCOUNTER — Other Ambulatory Visit: Payer: Self-pay | Admitting: Neurology

## 2020-06-11 DIAGNOSIS — Z79899 Other long term (current) drug therapy: Secondary | ICD-10-CM

## 2020-06-11 MED ORDER — LAMOTRIGINE 100 MG PO TABS: 100.0000 mg | ORAL_TABLET | Freq: Two times a day (BID) | ORAL | 3 refills | Status: DC

## 2020-06-11 MED ORDER — LAMOTRIGINE 25 MG PO TABS
75.0000 mg | ORAL_TABLET | Freq: Two times a day (BID) | ORAL | 3 refills | Status: DC
Start: 2020-06-11 — End: 2020-07-01

## 2020-06-15 NOTE — Telephone Encounter (Signed)
See other  Message answered

## 2020-06-16 ENCOUNTER — Telehealth: Payer: Self-pay | Admitting: Neurology

## 2020-06-16 NOTE — Telephone Encounter (Signed)
Pt's Husband(on DPR)is asking for a call from Dr Jaynee Eagles to discuss the difficulty pt had with a [facility].  Husband wants to know if there is further testing pt needs.

## 2020-06-17 NOTE — Telephone Encounter (Signed)
Spoke with patient's husband and discussed that a number of things could cause some confusion, even a UTI or other laboratory changes. I advised the patient see PCP for full evaluation to see if there is a cause of this. If there are underlying concerns of need for neurology the PCP can refer to Dr Jaynee Eagles for evaluation. The pt's husband verbalized understanding and appreciation for the call. He stated they would schedule an appt with Dr Regis Bill.

## 2020-06-18 ENCOUNTER — Other Ambulatory Visit: Payer: Self-pay | Admitting: Internal Medicine

## 2020-06-18 NOTE — Telephone Encounter (Signed)
She  Had  appt with Dr Jaynee Eagles   12 21    So not sure why referral needed except poss insurance cause     Please do referral as requested but would contact patient first to see  If this message is accurate  And document

## 2020-06-22 ENCOUNTER — Telehealth: Payer: Self-pay | Admitting: Internal Medicine

## 2020-06-22 NOTE — Telephone Encounter (Signed)
Pts spouse is calling in stating that he is very concerned about the pts memory and would like to know what he should or could do for the pt.  Spouse declined an appointment until after he hears back from the provider.  He would like to have a call back.

## 2020-06-22 NOTE — Telephone Encounter (Signed)
We can have a visit with you and your wife together.  If she agrees  In addition since she is in care with neurology ,reach out to them for an appointment or assessment since they are prescribing a number of medicines and are the experts on memory evaluations  Sometimes medications can change cognition and may need to be adjusted or stopped.  For any visit should bring in all of the bottles of medicines that are being taken

## 2020-06-23 NOTE — Telephone Encounter (Signed)
Patients husband said a referral is needed for neurology for memory issues.  Appointment scheduled to see Dr.[name] on 07/01/2020 in office at 2pm

## 2020-06-24 ENCOUNTER — Telehealth: Payer: Self-pay | Admitting: Pharmacist

## 2020-06-25 ENCOUNTER — Other Ambulatory Visit: Payer: Self-pay | Admitting: Internal Medicine

## 2020-06-29 NOTE — Chronic Care Management (AMB) (Signed)
Chronic Care Management Pharmacy Assistant   Name: [name]  MRN: [id number] DOB: 07-26-1940  Reason for Encounter: Disease State/Hypertension Adherence Call  PCP : [name], MD  Allergies:   Allergies  Allergen Reactions  . Elemental Sulfur Hives and Itching  . Sulfamethoxazole-Trimethoprim Other (See Comments)    REACTION: unspecified    Medications: Outpatient Encounter Medications as of 06/24/2020  Medication Sig Note  . Acetaminophen (TYLENOL PO) Take 2 tablets by mouth as needed. 05/16/2020: Used @ home PRN  . albuterol (PROVENTIL HFA;VENTOLIN HFA) 108 (90 Base) MCG/ACT inhaler Inhale 2 puffs into the lungs every 6 (six) hours as needed. (Patient not taking: Reported on 05/25/2020)   . alendronate (FOSAMAX) 70 MG tablet TAKE 1 TABLET BY MOUTH ONCE A WEEK, AS DIRECTED WITH FULL GLASS OF WATER ON EMPTY STOMACH   . benazepril (LOTENSIN) 20 MG tablet TAKE 1 TABLET(20 MG) BY MOUTH DAILY   . benzonatate (TESSALON PERLES) 100 MG capsule Take 1 capsule (100 mg total) by mouth 3 (three) times daily as needed for cough. (Patient not taking: Reported on 05/25/2020)   . butalbital-acetaminophen-caffeine (FIORICET WITH CODEINE) 50-325-40-30 MG capsule Take 1 capsule by mouth every 4 (four) hours as needed for headache. Rescue (Patient not taking: Reported on 05/25/2020) 05/16/2020: Has @ home PRN  . cloNIDine (CATAPRES) 0.1 MG tablet TAKE 1 TABLET(0.1 MG) BY MOUTH TWICE DAILY 05/16/2020: 1 dose as of 12/24  . conjugated estrogens (PREMARIN) vaginal cream Place 1 Applicatorful vaginally 2 (two) times a week.   . fluticasone (FLONASE) 50 MCG/ACT nasal spray Place 2 sprays into both nostrils as needed for allergies or rhinitis.   . furosemide (LASIX) 20 MG tablet TAKE 1 TABLET(20 MG) BY MOUTH DAILY   . lamoTRIgine (LAMICTAL) 100 MG tablet Take 1 tablet (100 mg total) by mouth 2 (two) times daily. Take each dose with 3 tablets of the Lamictal 2m for a total of 1719mtwice daily.    . Marland KitchenamoTRIgine (LAMICTAL) 25 MG tablet Take 3 tablets (75 mg total) by mouth 2 (two) times daily. Take 3 tablets twice daily with your 10070mill for a total of 175m1mice daily   . Multiple Vitamin (MULTIVITAMIN ADULT PO) Take 1 tablet by mouth.   . omMarland Kitchenprazole (PRILOSEC) 20 MG capsule TAKE 1 CAPSULE(20 MG) BY MOUTH DAILY   . ondansetron (ZOFRAN-ODT) 4 MG disintegrating tablet Take 1-2 tablets (4-8 mg total) by mouth every 8 (eight) hours as needed for nausea. 05/16/2020: Has @ home PRN  . OVER THE COUNTER MEDICATION OTC eye drops   . [DISCONTINUED] carvedilol (COREG) 25 MG tablet TAKE 1 TABLET(25 MG) BY MOUTH TWICE DAILY WITH A MEAL 05/16/2020: Only 1 dose as of 12/24   No facility-administered encounter medications on file as of 06/24/2020.    Current Diagnosis: Patient Active Problem List   Diagnosis Date Noted  . Lab test positive for detection of COVID-19 virus 05/25/2020  . Abdominal pain 05/16/2020  . Hematoma of abdominal wall, initial encounter 05/16/2020  . 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 Radiculopathy due to lumbar intervertebral disc disorder 03/27/2019  . 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 Diarrhea 11/27/2017  . Unilateral headache 05/26/2015  . Gallstones 03/12/2015  . Nausea without vomiting 12/10/2014  . Essential hypertension 05/29/2014  . 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 Low back pain radiating to right leg 01/16/2013  . Sinus problem 11/20/2012  . Chronic headaches morning 10/19/2012  .  Low sodium levels  133 10/19/2012  . Leg pain, posterior 08/16/2012  . Sciatic neuritis 08/16/2012  . Sleep disturbance, unspecified 08/16/2012  . Medication management 03/24/2012  . Preventative health care 09/24/2011  . Trigeminal neuralgia 09/24/2011  . Postmenopausal HRT (hormone replacement therapy) 09/24/2011  . Polycythemia 09/20/2011  . Morning headache 02/01/2011  . Hearing aid  worn   . Abnormal blood finding   . LOCALIZED SUPERFICIAL SWELLING MASS OR LUMP 09/21/2009  . DYSPNEA 05/07/2008  . Hypnic headache 03/19/2008  . 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 Headache(784.0) 06/05/2007  . JAW PAIN 04/06/2007  . Osteoarthritis 04/06/2007  . ABNORMAL RESULT, FUNCTION STUDY, [place] 02/05/2007  . HYPERLIPIDEMIA 12/25/2006  . HYPERTENSION 12/25/2006    Goals Addressed   None    Reviewed chart prior to disease state call. Spoke with patient regarding BP  Recent Office Vitals: BP Readings from Last 3 Encounters:  05/16/20 (!) 171/93  05/11/20 (!) 184/95  03/17/20 (!) 144/84   Pulse Readings from Last 3 Encounters:  05/16/20 64  05/11/20 71  03/17/20 66    Wt Readings from Last 3 Encounters:  05/25/20 120 lb (54.4 kg)  05/15/20 121 lb 4.1 oz (55 kg)  05/11/20 122 lb (55.3 kg)     Kidney Function Lab Results  Component Value Date/Time   CREATININE 0.80 05/16/2020 05:20 AM   CREATININE 0.89 05/15/2020 10:39 PM   GFR 55.22 (L) 10/11/2019 10:31 AM   GFRNONAA >60 05/16/2020 05:20 AM   GFRAA 73 05/11/2020 01:30 PM    BMP Latest Ref Rng & Units 05/16/2020 05/15/2020 05/11/2020  Glucose 70 - 99 mg/dL 105(H) 96 97  BUN 8 - 23 mg/dL 12 15 18   Creatinine 0.44 - 1.00 mg/dL 0.80 0.89 0.87  BUN/Creat Ratio 12 - 28 - - 21  Sodium 135 - 145 mmol/L 139 139 140  Potassium 3.5 - 5.1 mmol/L 3.5 4.0 4.3  Chloride 98 - 111 mmol/L 102 101 98  CO2 22 - 32 mmol/L 26 26 27   Calcium 8.9 - 10.3 mg/dL 8.4(L) 9.1 9.3    . Current antihypertensive regimen:  o Benazepril 20 mg 1 tablet daily - in PM o Carvedilol 25 mg 1 tablet BID  o Clonidine 0.1 mg 1 tablet BID o Furosemide 20 mg 1 tablet daily - in AM . How often are you checking your Blood Pressure? 1-2x per week . Current home BP readings:  o 01/28 132/82 o 02/01 150/100 o 02/04 156/96 o 02/07 160/102 . What recent interventions/DTPs have been made by any provider to improve Blood  Pressure control since last CPP Visit: None . Any recent hospitalizations or ED visits since last visit with CPP? Yes . What diet changes have been made to improve Blood Pressure Control?  o No Change . What exercise is being done to improve your Blood Pressure Control?  o No Change  Adherence Review: Is the patient currently on ACE/ARB medication? Yes Does the patient have >5 day gap between last estimated fill dates? No  Follow-Up:  Pharmacist Review  I spoke with the patient and discussed medication adherence with the patient, no issues at this time with current medication.  She states that she has been getting high numbers recently in her blood pressure. She states she has not had any headaches or changes in vision. She will be following up with her primary care doctor this week. She has not had any ED visits since December 2021. The patient denies any side effects with  her medication. Also, denies any problems with her current pharmacy.   Maia Breslow, [place] Assistant [phone]

## 2020-07-01 ENCOUNTER — Encounter: Payer: Self-pay | Admitting: Internal Medicine

## 2020-07-01 ENCOUNTER — Other Ambulatory Visit: Payer: Self-pay

## 2020-07-01 ENCOUNTER — Ambulatory Visit: Payer: Medicare Other | Admitting: Internal Medicine

## 2020-07-01 ENCOUNTER — Other Ambulatory Visit: Payer: Self-pay | Admitting: Neurology

## 2020-07-01 VITALS — BP 150/80 | HR 63 | Temp 97.5°F | Ht 66.0 in | Wt 121.2 lb

## 2020-07-01 DIAGNOSIS — R413 Other amnesia: Secondary | ICD-10-CM

## 2020-07-01 DIAGNOSIS — R2689 Other abnormalities of gait and mobility: Secondary | ICD-10-CM | POA: Diagnosis not present

## 2020-07-01 DIAGNOSIS — Z8616 Personal history of COVID-19: Secondary | ICD-10-CM | POA: Diagnosis not present

## 2020-07-01 DIAGNOSIS — Z79899 Other long term (current) drug therapy: Secondary | ICD-10-CM

## 2020-07-01 DIAGNOSIS — R278 Other lack of coordination: Secondary | ICD-10-CM | POA: Diagnosis not present

## 2020-07-01 LAB — CBC WITH DIFFERENTIAL/PLATELET
Basophils Absolute: 0 10*3/uL (ref 0.0–0.1)
Basophils Relative: 0.9 % (ref 0.0–3.0)
Eosinophils Absolute: 0.1 10*3/uL (ref 0.0–0.7)
Eosinophils Relative: 3.3 % (ref 0.0–5.0)
HCT: 39.2 % (ref 36.0–46.0)
Hemoglobin: 13.3 g/dL (ref 12.0–15.0)
Lymphocytes Relative: 17.6 % (ref 12.0–46.0)
Lymphs Abs: 0.8 10*3/uL (ref 0.7–4.0)
MCHC: 33.8 g/dL (ref 30.0–36.0)
MCV: 91.6 fl (ref 78.0–100.0)
Monocytes Absolute: 0.4 10*3/uL (ref 0.1–1.0)
Monocytes Relative: 8.9 % (ref 3.0–12.0)
Neutro Abs: 3.2 10*3/uL (ref 1.4–7.7)
Neutrophils Relative %: 69.3 % (ref 43.0–77.0)
Platelets: 216 10*3/uL (ref 150.0–400.0)
RBC: 4.28 Mil/uL (ref 3.87–5.11)
RDW: 14.3 % (ref 11.5–15.5)
WBC: 4.6 10*3/uL (ref 4.0–10.5)

## 2020-07-01 LAB — TSH: TSH: 0.88 u[IU]/mL (ref 0.35–4.50)

## 2020-07-01 LAB — HEPATIC FUNCTION PANEL
ALT: 17 U/L (ref 0–35)
AST: 19 U/L (ref 0–37)
Albumin: 3.9 g/dL (ref 3.5–5.2)
Alkaline Phosphatase: 59 U/L (ref 39–117)
Bilirubin, Direct: 0.2 mg/dL (ref 0.0–0.3)
Total Bilirubin: 0.7 mg/dL (ref 0.2–1.2)
Total Protein: 6 g/dL (ref 6.0–8.3)

## 2020-07-01 LAB — VITAMIN B12: Vitamin B-12: 933 pg/mL — ABNORMAL HIGH (ref 211–911)

## 2020-07-01 LAB — BASIC METABOLIC PANEL
BUN: 16 mg/dL (ref 6–23)
CO2: 32 mEq/L (ref 19–32)
Calcium: 9.4 mg/dL (ref 8.4–10.5)
Chloride: 103 mEq/L (ref 96–112)
Creatinine, Ser: 0.91 mg/dL (ref 0.40–1.20)
GFR: 59.39 mL/min — ABNORMAL LOW (ref >60.00)
Glucose, Bld: 94 mg/dL (ref 70–99)
Potassium: 3.9 mEq/L (ref 3.5–5.1)
Sodium: 142 mEq/L (ref 135–145)

## 2020-07-01 MED ORDER — OXCARBAZEPINE 300 MG PO TABS
300.0000 mg | ORAL_TABLET | Freq: Two times a day (BID) | ORAL | 3 refills | Status: DC
Start: 2020-07-01 — End: 2020-07-02

## 2020-07-01 MED ORDER — LAMOTRIGINE 100 MG PO TABS
100.0000 mg | ORAL_TABLET | Freq: Every day | ORAL | 3 refills | Status: DC
Start: 2020-07-01 — End: 2020-08-26

## 2020-07-01 NOTE — Patient Instructions (Addendum)
I want you to see neurology again and I will reach out  To them . To be seen soon for this . Consider MRI /CT  of brain  Because of recent changes  Possible the medication could do this ? If   Post covid or not.

## 2020-07-01 NOTE — Progress Notes (Unsigned)
Chief Complaint  Patient presents with  . Follow-up    Confusion, failed test to get in [place]. Tried to get in to see neurologist, needs a new referral     HPI: [name] 80 y.o. come in with husband  for concern about  Memory   Had time when couldn't write    No recently falling.   Hard to get up stairs and legs felt weak about 10 days.  Less than hour. .  Visual changes   Hard to perceive. .  Depth perception.  And change in handwriting.  She states that when she tries to handwrite at times she knows what she wants to say on her brain but she cannot put it on paper.  The symptoms wax and wane. No lov. Loose shadow line she states that she does get diplopia but that is because she has a fall and a close lens.  She has hearing loss and her aids are available right now. Onset of these changes  inJanuary    And husband noted and handwriting was very off.  When she was writing the checks for bills and finances although is improved in the recent past.  He said there were multiple checks written and had to be redone because of her handwriting.     Medications were changed from trileptil  to Lamictal end of December she had COVID infection the end of December Christmas Eve .  Husband states she has not been like this before her balance is not that good but has good days and bad days. No unusual headaches but does have her trigeminal neuralgia. Husband also states occasionally when answering a question seems nonsensical or non sequitur. They are working on moving to [place] and she "failed" the memory part of the evaluation. He states that her lucidity is much better today. ROS: See pertinent positives and negatives per HPI. No cp sob bleeding she and her spell seem to agree that her back problem is significantly improved after the surgery but she is on pain medicine or neurologic medicine mostly for the [place]  No current uti  Fever .   She has been evaluated for feet puffiness  and hypertension it was felt that she was on the appropriate meds for this no changes.  Past Medical History:  Diagnosis Date  . Abdominal pain 05/29/2013   s/p rx of cephalo resistant e coli   but last rx NG  now residular ?  bladder sx repeat cx sx rx to ty and uro consult   . ADJ DISORDER WITH MIXED ANXIETY & DEPRESSED MOOD 03/03/2010   Qualifier: Diagnosis of  By: [name], [name]   . Agent resistant to multiple antibiotics 05/29/2013   e coli   bu NG on fu.    . ARF (acute renal failure) ([facility]) 03/12/2015  . Closed head injury 02/01/2011   from syncope and had scalp laceration  neg ct .    Marland Kitchen Closed head injury 5-6 yrs ago  . Colitis 11/27/2017  . Complication of anesthesia    migraine several hours after general anesthesia  . Fatty liver   . Gall stones 2016   see ct scan neg HIDA   . GERD (gastroesophageal reflux disease)   . Hearing aid worn   . HOH (hard of hearing)    both ears  . Hyperlipidemia   . Hypertension    echo nl lv function  mild dilitation 2009  . Kidney infection  few yrs ago in hospital  . Medication side effect 09/02/2010   Poss muscle se of 10 crestor   . Migraine    hypnic HA eval by Dr. Earley Favor in the past  . Polycythemia   . 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 Pyelonephritis 03/12/2015  . 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 Syncope 02/01/2011   In shower on vacation  sustained head laceration  8 sutures Had ed visit neg head ct labs and x ray   . Trigeminal neuralgia pain     Family History  Problem Relation Age of Onset  . Ovarian cancer Mother   . Stroke Mother   . Alcohol abuse Father   . Stroke Father   . Diabetes Brother   . Cancer Paternal Aunt        leukemia, unknown type  . Seizures Daughter   . Hypertension Other   . Colon cancer Neg Hx     Social History   Socioeconomic History  . Marital status: Married    Spouse name: Not on file  . Number of children: 2  . Years of education: Not on file   . Highest education level: Not on file  Occupational History    Comment: retired Forensic psychologist  Tobacco Use  . Smoking status: Never Smoker  . Smokeless tobacco: Never Used  Vaping Use  . Vaping Use: Never used  Substance and Sexual Activity  . Alcohol use: Yes    Alcohol/week: 2.0 standard drinks    Types: 2 Glasses of wine per week    Comment: occ wine  . Drug use: No  . Sexual activity: Not on file  Other Topics Concern  . Not on file  Social History Narrative   Married   HH of 2-3 (god daughter)   Pets 2 dogs   Non smoker    Child is a physician   G2P2      Caffeine: 2 cups/day   Social Determinants of Health   Financial Resource Strain: Low Risk   . Difficulty of Paying Living Expenses: Not hard at all  Food Insecurity: No Food Insecurity  . Worried About Charity fundraiser in the Last Year: Never true  . Ran Out of Food in the Last Year: Never true  Transportation Needs: No Transportation Needs  . Lack of Transportation (Medical): No  . Lack of Transportation (Non-Medical): No  Physical Activity: Inactive  . Days of Exercise per Week: 0 days  . Minutes of Exercise per Session: 0 min  Stress: No Stress Concern Present  . Feeling of Stress : Not at all  Social Connections: Moderately Integrated  . Frequency of Communication with Friends and Family: More than three times a week  . Frequency of Social Gatherings with Friends and Family: Once a week  . Attends Religious Services: 1 to 4 times per year  . Active Member of Clubs or Organizations: No  . Attends Archivist Meetings: Never  . Marital Status: Married    Outpatient Medications Prior to Visit  Medication Sig Dispense Refill  . Acetaminophen (TYLENOL PO) Take 2 tablets by mouth as needed.    Marland Kitchen alendronate (FOSAMAX) 70 MG tablet TAKE 1 TABLET BY MOUTH ONCE A WEEK, AS DIRECTED WITH FULL GLASS OF WATER ON EMPTY STOMACH 4 tablet 3  . benazepril (LOTENSIN) 20 MG tablet TAKE 1 TABLET(20 MG)  BY MOUTH DAILY 90 tablet 1  . butalbital-acetaminophen-caffeine (  FIORICET WITH CODEINE) 50-325-40-30 MG capsule Take 1 capsule by mouth every 4 (four) hours as needed for headache. Rescue 30 capsule 0  . carvedilol (COREG) 25 MG tablet TAKE 1 TABLET(25 MG) BY MOUTH TWICE DAILY WITH A MEAL 60 tablet 3  . cloNIDine (CATAPRES) 0.1 MG tablet TAKE 1 TABLET(0.1 MG) BY MOUTH TWICE DAILY 60 tablet 3  . fluticasone (FLONASE) 50 MCG/ACT nasal spray Place 2 sprays into both nostrils as needed for allergies or rhinitis. 16 g 5  . lamoTRIgine (LAMICTAL) 100 MG tablet Take 1 tablet (100 mg total) by mouth daily. 60 tablet 3  . Multiple Vitamin (MULTIVITAMIN ADULT PO) Take 1 tablet by mouth.    Marland Kitchen omeprazole (PRILOSEC) 20 MG capsule TAKE 1 CAPSULE(20 MG) BY MOUTH DAILY 30 capsule 2  . ondansetron (ZOFRAN-ODT) 4 MG disintegrating tablet Take 1-2 tablets (4-8 mg total) by mouth every 8 (eight) hours as needed for nausea. 60 tablet 3  . OVER THE COUNTER MEDICATION OTC eye drops    . albuterol (PROVENTIL HFA;VENTOLIN HFA) 108 (90 Base) MCG/ACT inhaler Inhale 2 puffs into the lungs every 6 (six) hours as needed. (Patient not taking: No sig reported) 1 Inhaler 2  . benzonatate (TESSALON PERLES) 100 MG capsule Take 1 capsule (100 mg total) by mouth 3 (three) times daily as needed for cough. (Patient not taking: No sig reported) 30 capsule 0  . conjugated estrogens (PREMARIN) vaginal cream Place 1 Applicatorful vaginally 2 (two) times a week. (Patient not taking: Reported on 07/01/2020)    . furosemide (LASIX) 20 MG tablet TAKE 1 TABLET(20 MG) BY MOUTH DAILY (Patient not taking: Reported on 07/01/2020) 30 tablet 3  . Oxcarbazepine (TRILEPTAL) 300 MG tablet Take 1 tablet (300 mg total) by mouth 2 (two) times daily. (Patient not taking: Reported on 07/01/2020) 60 tablet 3   No facility-administered medications prior to visit.     EXAM:  BP (!) 150/80 (BP Location: Left Arm, Patient Position: Sitting, Cuff Size: Normal)    Pulse 63   Temp (!) 97.5 F (36.4 C) (Oral)   Ht 5' 6"  (1.676 m)   Wt 121 lb 3.2 oz (55 kg)   SpO2 97%   BMI 19.56 kg/m   Body mass index is 19.56 kg/m.  GENERAL: vitals reviewed and listed above, alert, oriented, to person date and place.  Appears well hydrated and in no acute distress conversation is a little bit diffuse in roundabout but answers most direct questions and feels today pretty good. HEENT: atraumatic, conjunctiva  clear, no obvious abnormalities on inspection of external nose and ears OP : masked   NECK: no obvious masses on inspection palpation  LUNGS: clear to auscultation bilaterally, no wheezes, rales or rhonchi, good air movement CV: HRRR, no clubbing cyanosis or  2+ puffy feet  peripheral edema nl cap refill  MS: moves all extremities without noticeable focal  Abnormality Gait is slightly unsteady able to rise from chair okay to use the cane small steps gait gets better with time.  No muscle rigidity or excessive movement tremor.  She was able to write today handwrite fairly smoothly but samples of her previous handwriting were jerky and abnormal. Is able to say no ifs, ands or buts without problem she was able to write a sentence with reasonable handwriting although words were capitalized that said this is a lot of fuss about not much with about not and much appears to be I's.  Normal punctuation. PSYCH: pleasant and cooperative, no obvious depression or  anxiety Lab Results  Component Value Date   WBC 4.6 07/01/2020   HGB 13.3 07/01/2020   HCT 39.2 07/01/2020   PLT 216.0 07/01/2020   GLUCOSE 94 07/01/2020   CHOL 279 (H) 05/27/2019   TRIG 78.0 05/27/2019   HDL 118.80 05/27/2019   LDLDIRECT 135.9 10/16/2012   LDLCALC 145 (H) 05/27/2019   ALT 17 07/01/2020   AST 19 07/01/2020   NA 142 07/01/2020   K 3.9 07/01/2020   CL 103 07/01/2020   CREATININE 0.91 07/01/2020   BUN 16 07/01/2020   CO2 32 07/01/2020   TSH 0.88 07/01/2020   INR 1.1 05/16/2020   HGBA1C  5.5 05/25/2018   BP Readings from Last 3 Encounters:  07/01/20 (!) 150/80  05/16/20 (!) 171/93  05/11/20 (!) 184/95    ASSESSMENT AND PLAN:  Discussed the following assessment and plan:  Memory change - Plan: Basic metabolic panel, CBC with Differential/Platelet, Hepatic function panel, TSH, Vitamin B12, Vitamin B12, TSH, Hepatic function panel, CBC with Differential/Platelet, Basic metabolic panel  Balance problem - Plan: Basic metabolic panel, CBC with Differential/Platelet, Hepatic function panel, TSH, Vitamin B12, Vitamin B12, TSH, Hepatic function panel, CBC with Differential/Platelet, Basic metabolic panel  Worsened handwriting - Plan: Basic metabolic panel, CBC with Differential/Platelet, Hepatic function panel, TSH, Vitamin B12, Vitamin B12, TSH, Hepatic function panel, CBC with Differential/Platelet, Basic metabolic panel  History of 2019 novel coronavirus disease (COVID-19) - Plan: Basic metabolic panel, CBC with Differential/Platelet, Hepatic function panel, TSH, Vitamin B12, Vitamin B12, TSH, Hepatic function panel, CBC with Differential/Platelet, Basic metabolic panel  Medication management - Plan: Basic metabolic panel, CBC with Differential/Platelet, Hepatic function panel, TSH, Vitamin B12, Vitamin B12, TSH, Hepatic function panel, CBC with Differential/Platelet, Basic metabolic panel Symptoms are a definite change over the last month  from baseline.  Changes of significance were Covid infection on Christmas Eve and her antipain medicine from neurology was changed. Uncertain if medication can be part of the problem Would like neurology to see her much sooner as soon as possible.  Would want to order head imaging uncertain if she can have MRI with the plate in her head?  stainless steel. Has always had history of falling her balance seems to be a bit off today for her when I am seeing her in the office previously. Updated lab today to include B12 thyroid chemistry. -Patient  advised to return or notify health care team  if  new concerns arise. Record review evaluation plan counsel 60 minutes. Patient Instructions  I want you to see neurology again and I will reach out  To them . To be seen soon for this . Consider MRI /CT  of brain  Because of recent changes  Possible the medication could do this ? If   Post covid or not.       [name]. Jevaun Strick M.D.

## 2020-07-02 ENCOUNTER — Encounter: Payer: Self-pay | Admitting: Internal Medicine

## 2020-07-03 ENCOUNTER — Telehealth: Payer: Self-pay | Admitting: Neurology

## 2020-07-03 NOTE — Telephone Encounter (Signed)
Stat new consult for acute cognitive decline from Dr. Regis Bill. Requesting to be seen in the next 2 weeks, we can always add her on if we don;t have any openings. Please let me know if we have any open appointments if not we will have to make a spot thanks

## 2020-07-05 NOTE — Addendum Note (Signed)
Addended byShanon Ace K on: 07/05/2020 01:55 PM   Modules accepted: Orders

## 2020-07-06 ENCOUNTER — Encounter: Payer: Self-pay | Admitting: *Deleted

## 2020-07-06 NOTE — Telephone Encounter (Signed)
Perfect thanks

## 2020-07-06 NOTE — Telephone Encounter (Signed)
We have an opening this Thursday at 330 pm.

## 2020-07-08 ENCOUNTER — Ambulatory Visit
Admission: RE | Admit: 2020-07-08 | Discharge: 2020-07-08 | Disposition: A | Discharge status: Home / Self Care | Payer: Medicare Other | Source: Ambulatory Visit | Attending: Internal Medicine | Admitting: Internal Medicine

## 2020-07-08 ENCOUNTER — Ambulatory Visit: Payer: Medicare Other | Admitting: Neurology

## 2020-07-08 ENCOUNTER — Encounter: Payer: Self-pay | Admitting: Neurology

## 2020-07-08 ENCOUNTER — Other Ambulatory Visit: Payer: Self-pay

## 2020-07-08 VITALS — BP 159/92 | HR 60 | Ht 66.0 in | Wt 124.0 lb

## 2020-07-08 DIAGNOSIS — Z8616 Personal history of COVID-19: Secondary | ICD-10-CM

## 2020-07-08 DIAGNOSIS — R4189 Other symptoms and signs involving cognitive functions and awareness: Secondary | ICD-10-CM

## 2020-07-08 DIAGNOSIS — R278 Other lack of coordination: Secondary | ICD-10-CM

## 2020-07-08 DIAGNOSIS — R4182 Altered mental status, unspecified: Secondary | ICD-10-CM

## 2020-07-08 DIAGNOSIS — R413 Other amnesia: Secondary | ICD-10-CM

## 2020-07-08 DIAGNOSIS — Z79899 Other long term (current) drug therapy: Secondary | ICD-10-CM

## 2020-07-08 DIAGNOSIS — R2689 Other abnormalities of gait and mobility: Secondary | ICD-10-CM

## 2020-07-08 DIAGNOSIS — M4802 Spinal stenosis, cervical region: Secondary | ICD-10-CM | POA: Diagnosis not present

## 2020-07-08 DIAGNOSIS — G500 Trigeminal neuralgia: Secondary | ICD-10-CM | POA: Diagnosis not present

## 2020-07-08 MED ORDER — GADOBENATE DIMEGLUMINE 529 MG/ML IV SOLN
11.0000 mL | Freq: Once | INTRAVENOUS | Status: AC | PRN
  Administered 2020-07-08: 11 mL via INTRAVENOUS

## 2020-07-08 NOTE — Patient Instructions (Addendum)
Formal memory testing - will call to schedule

## 2020-07-08 NOTE — Progress Notes (Signed)
[facility]    Provider:  Dr Jaynee Eagles Requesting Provider: Erline Levine, MD Primary Care Provider:  [name], MD  CC: trigeminal neuralgia  Interval history 07/08/2020:   Patient is here on concern of memory changes. She says in the last month she has gotten much better as far as the sciatica goes. She can walk around te house. She uses a Programmer, multimedia around the house. She did therapy last spring, and so she is trying to strengthen the muscles in her legs. 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 She is very lucid right now but in the morning she gets unstable and is confused. She can almost fall after she takes her medicine. She feels fantastic now on the oxcarb and lamictal.    MRI brain: No evidence of recent infarction, hemorrhage, or mass. Stable postoperative changes of prior trigeminal nerve decompression.Stable mild chronic microvascular ischemic changes' . MRI cervical spine:  No abnormal cord signal. Multilevel degenerative changes as detailed above. No high-grade canal stenosis. Foraminal narrowing is greatest on the left at C5-C6.  TSH 0.88, B12 933,   Patient complains of symptoms per HPI as well as the following symptoms: imbalance, falls . Pertinent negatives and positives per HPI. All others negative   Interval history 05/11/2020: She is taking more Lamictal. She had the procedure with Dr. Vertell Limber, her right face was numb for some time, there ois discomfort now, once in a while she gets the lightning strike, the procedure made a huge difference. She is on 155m twice a day of the Lamictal.   HPI:  [name] a 80y.o. female here as requested by [name], WStandley Brooking MD for Trigeminal neuralgia. 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 EMart Piggsin the past, remote kidney infection, hypertension, hyperlipidemia, hard of hearing, fatty liver, anxiety and depression.  I reviewed Dr. SMelven Sartoriusnotes, she was seen there for evaluation of bilateral leg pain, increased leg pain left greater than right 6 months, weakness in both legs, she had epidural steroid injections x3, most recently left L4-L5 in November with no relief, numerous medications trialed most recently Cymbalta, Tylenol, Motrin.  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 Reviewed Dr. PVelora Mediatenotes, patient sodium was affected on Tegretol.   [facility], she was told hopefully the surgery would help but since then she had been increasing the medication and increased to 12060ma day and was decreased due to blood work and side effects, and now she has stopped the carbamazepine. She was getting side effects from  the carbamazepine but it was helping. Dr. Vertell Limber placed her on Oxcarbazepine(trileptal) and now she is on cymbalta, she started the Trileptal a few weeks ago. She was started on 351m and just a few days ago  she was increased to 6053mtwice a day(120057m day). She is not taking the cymbalta, was likely prescribed for low back pain (Dr. NewErnestina PatchesThe ain starts on the right and shoots down her jaw, chewing, talking makes it worse, severe, shooting, same pain she has always experienced. The pain is severe today. She says she is unstable and tired, gets better after 45 minutes. When she did the clock test she could not draw the clock. No coordination. All the numbers were in sequence. She could not draw.   Review of Systems: Patient complains of symptoms per HPI as well as the following symptoms: hyponatremia. Pertinent negatives and positives per HPI. All others negative.   Social History   Socioeconomic History  . Marital status: Married    Spouse name: Not on file  . Number of children: 2  . Years of education: Not on file  . Highest education level: Not on file  Occupational History    Comment: retired reaForensic psychologistobacco Use  . Smoking status: Never Smoker  . Smokeless tobacco: Never Used  Vaping Use  . Vaping Use: Never used  Substance and Sexual Activity  . Alcohol use: Not Currently    Alcohol/week: 2.0 standard drinks    Types: 2 Glasses of wine per week    Comment: occ wine  . Drug use: No  . Sexual activity: Not on file  Other Topics Concern  . Not on file  Social History Narrative   Married   HH of 2-3 (god daughter)   Pets 2 dogs   Non smoker    Child is a physician   G2P2      Caffeine: 2 cups/day   Social Determinants of Health   Financial Resource Strain: Low Risk   . Difficulty of Paying Living Expenses: Not hard at all  Food Insecurity: No Food Insecurity  . Worried About RunCharity fundraiser the Last Year: Never true  . Ran Out of Food in the Last Year: Never true  Transportation Needs: No Transportation Needs  . Lack of Transportation (Medical): No  . Lack of Transportation (Non-Medical): No  Physical Activity: Inactive  . Days of Exercise per  Week: 0 days  . Minutes of Exercise per Session: 0 min  Stress: No Stress Concern Present  . Feeling of Stress : Not at all  Social Connections: Moderately Integrated  . Frequency of Communication with Friends and Family: More than three times a week  . Frequency of Social Gatherings with Friends and Family: Once a week  . Attends Religious Services: 1 to 4 times per year  . Active Member of Clubs or Organizations: No  . Attends CluArchivistetings: Never  . Marital Status: Married  IntHuman resources officerolence: Not At Risk  . Fear of Current or Ex-Partner: No  . Emotionally Abused: No  . Physically Abused: No  . Sexually Abused: No    Family History  Problem Relation Age of Onset  . Ovarian cancer Mother   . Stroke Mother   . Alcohol abuse Father   . Stroke Father   . Diabetes Brother   . Cancer Paternal Aunt        leukemia, unknown type  . Seizures Daughter   .  Hypertension Other   . Colon cancer Neg Hx   . Dementia Neg Hx   . Alzheimer's disease Neg Hx     Past Medical History:  Diagnosis Date  . Abdominal pain 05/29/2013   s/p rx of cephalo resistant e coli   but last rx NG  now residular ?  bladder sx repeat cx sx rx to ty and uro consult   . ADJ DISORDER WITH MIXED ANXIETY & DEPRESSED MOOD 03/03/2010   Qualifier: Diagnosis of  By: [name], [name]   . Agent resistant to multiple antibiotics 05/29/2013   e coli   bu NG on fu.    . ARF (acute renal failure) ([place]) 03/12/2015  . Closed head injury 02/01/2011   from syncope and had scalp laceration  neg ct .    Marland Kitchen Closed head injury 5-6 yrs ago  . Colitis 11/27/2017  . Complication of anesthesia    migraine several hours after general anesthesia  . Fatty liver   . Gall stones 2016   see ct scan neg HIDA   . GERD (gastroesophageal reflux disease)   . Hearing aid worn   . HOH (hard of hearing)    both ears  . Hyperlipidemia   . Hypertension    echo nl lv function  mild dilitation 2009  . Kidney infection     few yrs ago in hospital  . Medication side effect 09/02/2010   Poss muscle se of 10 crestor   . Migraine    hypnic HA eval by Dr. Earley Favor in the past  . Polycythemia   . 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 Pyelonephritis 03/12/2015  . 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 Syncope 02/01/2011   In shower on vacation  sustained head laceration  8 sutures Had ed visit neg head ct labs and x ray   . Trigeminal neuralgia pain     Patient Active Problem List   Diagnosis Date Noted  . Lab test positive for detection of COVID-19 virus 05/25/2020  . Abdominal pain 05/16/2020  . Hematoma of abdominal wall, initial encounter 05/16/2020  . 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 Radiculopathy due to lumbar intervertebral disc disorder 03/27/2019  . 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 Diarrhea 11/27/2017  . Unilateral headache 05/26/2015  . Gallstones 03/12/2015  . Nausea without vomiting 12/10/2014  . Essential hypertension 05/29/2014  . 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 Low back pain radiating to right leg 01/16/2013  . Sinus problem 11/20/2012  . Chronic headaches morning 10/19/2012  . Low sodium levels  133 10/19/2012  . Leg pain, posterior 08/16/2012  . Sciatic neuritis 08/16/2012  . Sleep disturbance, unspecified 08/16/2012  . Medication management 03/24/2012  . Preventative health care 09/24/2011  . Trigeminal neuralgia 09/24/2011  . Postmenopausal HRT (hormone replacement therapy) 09/24/2011  . Polycythemia 09/20/2011  . Morning headache 02/01/2011  . Hearing aid worn   . Abnormal blood finding   . LOCALIZED SUPERFICIAL SWELLING MASS OR LUMP 09/21/2009  . DYSPNEA 05/07/2008  . Hypnic headache 03/19/2008  . 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 Headache(784.0) 06/05/2007  . JAW PAIN 04/06/2007  . Osteoarthritis 04/06/2007  . ABNORMAL RESULT,  FUNCTION STUDY, [place] 02/05/2007  . HYPERLIPIDEMIA 12/25/2006  . HYPERTENSION 12/25/2006    Past Surgical History:  Procedure Laterality Date  . ABDOMINAL HYSTERECTOMY  2002   tubal  . CARDIAC CATHETERIZATION  2000   chest pains neg  . CHOLECYSTECTOMY N/A 02/21/2017   Procedure: LAPAROSCOPIC CHOLECYSTECTOMY WITH INTRAOPERATIVE CHOLANGIOGRAM;  Surgeon: Armandina Gemma, MD;  Location: [facility];  Service: General;  Laterality: N/A;  . COLONOSCOPY     multiple  . CRANIOTOMY  12/09/2011   nerve decompression right trigeminal   . DOPPLER ECHOCARDIOGRAPHY  2009   nl lv function mild lv dilitation  . EYE SURGERY Bilateral    ioc for catatracts  . laparoscopic gallbladder surgery  02/16/2017   Fax from [facility]  . OOPHORECTOMY Bilateral 2002  . rt shoulder surgery      Current Outpatient Medications  Medication Sig Dispense Refill  . Acetaminophen (TYLENOL PO) Take 2 tablets by mouth as needed.    Marland Kitchen alendronate (FOSAMAX) 70 MG tablet TAKE 1 TABLET BY MOUTH ONCE A WEEK, AS DIRECTED WITH FULL GLASS OF WATER ON EMPTY STOMACH 4 tablet 3  . benazepril (LOTENSIN) 20 MG tablet TAKE 1 TABLET(20 MG) BY MOUTH DAILY 90 tablet 1  . butalbital-acetaminophen-caffeine (FIORICET WITH CODEINE) 50-325-40-30 MG capsule Take 1 capsule by mouth every 4 (four) hours as needed for headache. Rescue 30 capsule 0  . carvedilol (COREG) 25 MG tablet TAKE 1 TABLET(25 MG) BY MOUTH TWICE DAILY WITH A MEAL 60 tablet 3  . cloNIDine (CATAPRES) 0.1 MG tablet TAKE 1 TABLET(0.1 MG) BY MOUTH TWICE DAILY 60 tablet 3  . conjugated estrogens (PREMARIN) vaginal cream 2 (two) times a week.    . fluticasone (FLONASE) 50 MCG/ACT nasal spray Place 2 sprays into both nostrils as needed for allergies or rhinitis. 16 g 5  . furosemide (LASIX) 20 MG tablet Take 20 mg by mouth daily.    Marland Kitchen lamoTRIgine (LAMICTAL) 100 MG tablet Take 1 tablet (100 mg total) by mouth daily. (Patient taking differently: Take 200 mg by mouth 2  (two) times daily.) 60 tablet 3  . Multiple Vitamin (MULTIVITAMIN ADULT PO) Take 1 tablet by mouth.    . ondansetron (ZOFRAN-ODT) 4 MG disintegrating tablet Take 1-2 tablets (4-8 mg total) by mouth every 8 (eight) hours as needed for nausea. 60 tablet 3  . OVER THE COUNTER MEDICATION OTC eye drops    . Oxcarbazepine (TRILEPTAL) 300 MG tablet Take 300 mg by mouth 2 (two) times daily.    Marland Kitchen omeprazole (PRILOSEC) 20 MG capsule TAKE 1 CAPSULE(20 MG) BY MOUTH DAILY (Patient not taking: Reported on 07/08/2020) 30 capsule 2   No current facility-administered medications for this visit.    Allergies as of 07/08/2020 - Review Complete 07/08/2020  Allergen Reaction Noted  . Elemental sulfur Hives and Itching 07/08/2019  . Sulfamethoxazole-trimethoprim Other (See Comments)     Vitals: BP (!) 159/92 (BP Location: Right Arm, Patient Position: Sitting)   Pulse 60   Ht 5' 6"  (1.676 m)   Wt 124 lb (56.2 kg)   BMI 20.01 kg/m  Last Weight:  Wt Readings from Last 1 Encounters:  07/08/20 124 lb (56.2 kg)   Last Height:   Ht Readings from Last 1 Encounters:  07/08/20 5' 6"  (1.676 m)    Exam: NAD, pleasant                  Speech:    Speech is normal; fluent and spontaneous with normal comprehension.  Cognition: MMSE - Mini Mental State Exam 07/08/2020  Orientation to time 5  Orientation to Place 5  Registration 3  Attention/ Calculation 2  Recall 3  Language- name 2 objects 2  Language-  repeat 1  Language- follow 3 step command 3  Language- read & follow direction 1  Write a sentence 1  Copy design 0  Total score 26       The patient is oriented to person, place, and time;   Cranial Nerves:    The pupils are equal, round, and reactive to light.Trigeminal sensation is intact and the muscles of mastication are normal. The face is symmetric. The palate elevates in the midline. Hearing intact. Voice is normal. Shoulder shrug is normal. The tongue has normal motion without fasciculations.    Coordination:  No dysmetria, no tremor  Motor Observation:    No asymmetry, no atrophy, and no involuntary movements noted. Tone:    Normal muscle tone.     Strength:    Strength is symmetrical in the upper and lower limbs, no focal deficits noted     Sensation: intact to LT      Assessment/Plan:  Lovely 80year old femle with severe pain. She has suggered with trigeminal neuralgia ([name]) since 2008, in 2013 had microvascular decompression at [facility]. Very difficult case, patient had hyponatremia with Tegretol, already on a high dose of Trileptal which was recently increased so may take time. Adding another anti-epilepsy medication or baclofen is a possibility but I do worry about side effects of having multiple AEDs or baclofen in an 80 year old. I have contacted Dr. Vertell Limber to see if Dr. Maryjean Ka can perform any interventions. Will try nerve block today. I'ver tried botox with other patients in the past with poor results, and risk of affecting CN 7, in the past. Discussed with Dr. Vertell Limber about his team performing gamma knife, balloon, rhizotomy or others. Discussed at length with patient. I do not think she needs repeat imaging, symptoms had not changed, no new symptoms or red flags.  Doing much better on lamictal and trileptal. Watch for hyponatremia. Check labs She is imbalanced in the mornings after taking her meds(5 pills). Recommended spacing out the pills to see if one is the culprit or if she is just taking too many at once. She will send me an email and let me know what happens Some confusion, unable to draw clock, husband and pcp confirmed for her memory, today mmse 26/30, will send for formal neurocognitive testing. MRI brain and cervical spine unremarkable.   Orders Placed This Encounter  Procedures  . Culture, Urine  . CBC with Differential/Platelets  . Comprehensive metabolic panel  . 10-Hydroxycarbazepine  . Lamotrigine level  . Urinalysis, Routine w reflex microscopic  .  Ambulatory referral to Neuropsychology   No orders of the defined types were placed in this encounter.   PRIOR: Hyponatremia with Tegretol, on 121m daily Trileptal, other medications with evidence include: baclofen,Gabapentin, lamictal, lyrica, baclofen, botox   No orders of the defined types were placed in this encounter.   Cc: [name], WStandley Brooking MD, JErline Levine MD  ASarina Ill MD  [facility] [address] [id number] Phone [phone] [phone] I spent over 40 minutes of face-to-face and non-face-to-face time with patient on the  1. Long-term use of high-risk medication   2. Trigeminal neuralgia   3. Altered mental status, unspecified altered mental status type   4. Cognitive changes    diagnosis.  This included previsit chart review, lab review, study review, order entry, electronic health record documentation, patient education on the different diagnostic and therapeutic options, counseling and coordination of care, risks and benefits of  management, compliance, or risk factor reduction

## 2020-07-11 NOTE — Progress Notes (Signed)
Blood results are stable and no explanation for the change in status.  Reassuring but not diagnostic.  Forwarding results to Dr. Lavell Anchors.

## 2020-07-11 NOTE — Progress Notes (Signed)
So  mri of brain and c spine shows no acute findings masses tumors   neck has degeneative  changes  and could cause pain but  shouldn't cause  balance  these new problems  Forwarding  info to dr Jaynee Eagles

## 2020-07-13 LAB — CBC WITH DIFFERENTIAL/PLATELET
Basophils Absolute: 0 10*3/uL (ref 0.0–0.2)
Basos: 1 %
EOS (ABSOLUTE): 0.2 10*3/uL (ref 0.0–0.4)
Eos: 4 %
Hematocrit: 41.9 % (ref 34.0–46.6)
Hemoglobin: 14 g/dL (ref 11.1–15.9)
Immature Grans (Abs): 0 10*3/uL (ref 0.0–0.1)
Immature Granulocytes: 0 %
Lymphocytes Absolute: 1 10*3/uL (ref 0.7–3.1)
Lymphs: 18 %
MCH: 30.2 pg (ref 26.6–33.0)
MCHC: 33.4 g/dL (ref 31.5–35.7)
MCV: 91 fL (ref 79–97)
Monocytes Absolute: 0.5 10*3/uL (ref 0.1–0.9)
Monocytes: 9 %
Neutrophils Absolute: 3.6 10*3/uL (ref 1.4–7.0)
Neutrophils: 68 %
Platelets: 226 10*3/uL (ref 150–450)
RBC: 4.63 x10E6/uL (ref 3.77–5.28)
RDW: 11.9 % (ref 11.7–15.4)
WBC: 5.3 10*3/uL (ref 3.4–10.8)

## 2020-07-13 LAB — COMPREHENSIVE METABOLIC PANEL
ALT: 20 IU/L (ref 0–32)
AST: 24 IU/L (ref 0–40)
Albumin/Globulin Ratio: 2 (ref 1.2–2.2)
Albumin: 4.4 g/dL (ref 3.7–4.7)
Alkaline Phosphatase: 75 IU/L (ref 44–121)
BUN/Creatinine Ratio: 18 (ref 12–28)
BUN: 18 mg/dL (ref 8–27)
Bilirubin Total: 0.5 mg/dL (ref 0.0–1.2)
CO2: 26 mmol/L (ref 20–29)
Calcium: 9.8 mg/dL (ref 8.7–10.3)
Chloride: 99 mmol/L (ref 96–106)
Creatinine, Ser: 0.99 mg/dL (ref 0.57–1.00)
GFR calc Af Amer: 62 mL/min/{1.73_m2} (ref >59)
GFR calc non Af Amer: 54 mL/min/{1.73_m2} — ABNORMAL LOW (ref >59)
Globulin, Total: 2.2 g/dL (ref 1.5–4.5)
Glucose: 93 mg/dL (ref 65–99)
Potassium: 4.3 mmol/L (ref 3.5–5.2)
Sodium: 140 mmol/L (ref 134–144)
Total Protein: 6.6 g/dL (ref 6.0–8.5)

## 2020-07-13 LAB — URINALYSIS, ROUTINE W REFLEX MICROSCOPIC
Bilirubin, UA: NEGATIVE
Glucose, UA: NEGATIVE
Ketones, UA: NEGATIVE
Leukocytes,UA: NEGATIVE
Nitrite, UA: NEGATIVE
RBC, UA: NEGATIVE
Specific Gravity, UA: 1.02 (ref 1.005–1.030)
Urobilinogen, Ur: 1 mg/dL (ref 0.2–1.0)
pH, UA: 7 (ref 5.0–7.5)

## 2020-07-13 LAB — LAMOTRIGINE LEVEL: Lamotrigine Lvl: 14.8 ug/mL (ref 2.0–20.0)

## 2020-07-13 LAB — URINE CULTURE

## 2020-07-13 LAB — 10-HYDROXYCARBAZEPINE: Oxcarbazepine SerPl-Mcnc: 14 ug/mL (ref 10–35)

## 2020-07-14 ENCOUNTER — Telehealth: Payer: Self-pay | Admitting: Neurology

## 2020-07-14 NOTE — Telephone Encounter (Signed)
Informed pt of follow-up appointment.

## 2020-07-14 NOTE — Telephone Encounter (Signed)
Pt called, have been communicating with Dr. Jaynee Eagles through [place]. She said she would like to talk to me next week. Dr. Jaynee Eagles did not have anything available next week. I don't know if she was gong to call me or message me. Would like a call from the nurse.

## 2020-07-14 NOTE — Telephone Encounter (Signed)
Per Dr Jaynee Eagles, please have her see [name] NP and Dr Jaynee Eagles will talk to [facility]. She currently has an opening this Thurs at 9 AM.

## 2020-07-14 NOTE — Telephone Encounter (Signed)
Called pt to inform her of appointment this Thursday, pt did not answer. LVM informing her of check-in time and scheduled appointment.

## 2020-07-15 ENCOUNTER — Other Ambulatory Visit: Payer: Self-pay | Admitting: Internal Medicine

## 2020-07-15 DIAGNOSIS — H903 Sensorineural hearing loss, bilateral: Secondary | ICD-10-CM | POA: Diagnosis not present

## 2020-07-16 ENCOUNTER — Other Ambulatory Visit: Payer: Self-pay

## 2020-07-16 ENCOUNTER — Encounter: Payer: Self-pay | Admitting: Adult Health

## 2020-07-16 ENCOUNTER — Ambulatory Visit: Payer: Medicare Other | Admitting: Adult Health

## 2020-07-16 VITALS — BP 160/80 | HR 65 | Ht 66.0 in | Wt 129.0 lb

## 2020-07-16 DIAGNOSIS — R4189 Other symptoms and signs involving cognitive functions and awareness: Secondary | ICD-10-CM

## 2020-07-16 DIAGNOSIS — G500 Trigeminal neuralgia: Secondary | ICD-10-CM

## 2020-07-16 NOTE — Patient Instructions (Signed)
Your Plan:  Continue Lamictal and tegretol  If your symptoms worsen or you develop new symptoms please let us know.   Thank you for coming to see [place] at [facility]. I hope we have been able to provide you high quality care today.  You may receive a patient satisfaction survey over the next few weeks. We would appreciate your feedback and comments so that we may continue to improve ourselves and the health of our patients.

## 2020-07-16 NOTE — Progress Notes (Signed)
PATIENT: Kaleb Linquist DOB: 1940/11/10  REASON FOR VISIT: follow up HISTORY FROM: patient  HISTORY OF PRESENT ILLNESS: Today 07/16/20:  Ms. Cyphers is an 80 year old female with a history of trigeminal neuralgia and memory disturbance.  She was seen last week by Dr. Lavell Anchors.  She sent several MyChart messages that did not make sense.  She comes in today for a visit.  She states that since she has spaced out her medication in the morning the dizziness and confusion has gotten better.  She states that she still feels wobbly after taking her medication however if she tries to reduce the dose the pain in the face becomes more severe.  At the last visit with Dr. Lavell Anchors the plan was to reach out to Dr. Vertell Limber to see if he can recommend any other treatment options.  The patient was also referred for neuropsychological testing for memory disturbance.  HISTORY (copied from Dr. Cathren Laine note) Patient is here on concern of memory changes. She says in the last month she has gotten much better as far as the sciatica goes. She can walk around te house. She uses a Programmer, multimedia around the house. She did therapy last spring, and so she is trying to strengthen the muscles in her legs. 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 She is very lucid right now but in the morning she gets unstable and is confused. She can almost fall after she takes her medicine. She feels fantastic now on the oxcarb and lamictal.    MRI brain: No evidence of recent infarction, hemorrhage, or mass. Stable postoperative changes of prior trigeminal nerve decompression.Stable mild chronic microvascular ischemic changes' . MRI cervical spine:  No abnormal cord signal. Multilevel degenerative changes  as detailed above. No high-grade canal stenosis. Foraminal narrowing is greatest on the left at C5-C6.  TSH 0.88, B12 933,   Patient complains of symptoms per HPI as well as the following symptoms: imbalance, falls . Pertinent negatives and positives per HPI. All others negative   Interval history 05/11/2020: She is taking more Lamictal. She had the procedure with Dr. Vertell Limber, her right face was numb for some time, there ois discomfort now, once in a while she gets the lightning strike, the procedure made a huge difference. She is on 143m twice a day of the Lamictal.   HPI:  SKimetha Trulsonis a 80y.o. female here as requested by [name], WStandley Brooking MD for Trigeminal neuralgia. 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 EMart Piggsin the past, remote kidney infection, hypertension, hyperlipidemia, hard of hearing, fatty liver, anxiety and depression.  I reviewed Dr. SMelven Sartoriusnotes, she was seen there for evaluation of bilateral leg pain, increased leg pain left greater than right 6 months, weakness in both legs, she had epidural steroid injections x3, most recently left L4-L5 in November with no relief, numerous medications trialed most recently Cymbalta, Tylenol, Motrin.  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 Reviewed Dr. [name] notes, patient sodium was affected on Tegretol.   She had surgery at [facility] clinic in 2013 for trigeminal neuralgia, she was told hopefully the surgery would help but since then she had been increasing the medication and increased to 1258m a day and was decreased due to blood work and side effects, and now she has stopped the carbamazepine. She was getting side effects from the carbamazepine but it was helping. Dr. SVertell Limberplaced her on Oxcarbazepine(trileptal) and now she is on cymbalta, she started the Trileptal a few weeks ago. She was started on 3018mand just a few days ago she was increased to [id number] a day(1200m74mday). She is not taking the cymbalta, was likely prescribed for low back pain (Dr. NewtErnestina Patcheshe ain starts on the right and shoots down her jaw, chewing, talking makes it worse, severe, shooting, same pain she has always experienced. The pain is severe today. She says she is unstable and tired, gets better after 45 minutes. When she did the clock test she could not draw the clock. No coordination. All the numbers were in sequence. She could not draw.    REVIEW OF SYSTEMS: Out of a complete 14 system review of symptoms, the patient complains only of the following symptoms, and all other reviewed systems are negative.  See HPI  ALLERGIES: Allergies  Allergen Reactions  . Elemental Sulfur Hives and Itching  . Sulfamethoxazole-Trimethoprim Other (See Comments)    REACTION: unspecified    HOME MEDICATIONS: Outpatient Medications Prior to Visit  Medication Sig Dispense Refill  . Acetaminophen (TYLENOL PO) Take 2 tablets by mouth as needed.    . alMarland Kitchenndronate (FOSAMAX) 70 MG tablet TAKE 1 TABLET BY MOUTH ONCE A WEEK, AS DIRECTED WITH FULL GLASS OF WATER ON EMPTY STOMACH 4 tablet 3  . benazepril (LOTENSIN) 20 MG tablet TAKE 1 TABLET(20 MG)  BY MOUTH DAILY 90 tablet 1  . butalbital-acetaminophen-caffeine (FIORICET WITH CODEINE) 50-325-40-30 MG capsule Take 1 capsule by mouth every 4 (four) hours as needed for headache. Rescue 30 capsule 0  . carvedilol (COREG) 25 MG tablet TAKE 1 TABLET(25 MG) BY MOUTH TWICE DAILY WITH A MEAL 60 tablet 3  . cloNIDine (CATAPRES) 0.1 MG tablet TAKE 1 TABLET(0.1 MG) BY MOUTH TWICE DAILY 60 tablet 3  . conjugated estrogens (PREMARIN) vaginal cream 2 (two) times a week.    . fluticasone (FLONASE) 50 MCG/ACT nasal spray Place 2 sprays into both nostrils as needed for allergies or rhinitis. 16 g 5  . furosemide (LASIX) 20 MG tablet Take 20 mg by mouth daily.    . laMarland KitchenoTRIgine (LAMICTAL) 100 MG tablet Take 1 tablet (100 mg total) by mouth daily. (Patient taking differently: Take 200 mg by mouth 2 (two) times daily.) 60 tablet 3  . Multiple Vitamin (MULTIVITAMIN ADULT PO) Take 1 tablet by mouth.    . omMarland Kitchenprazole (PRILOSEC) 20 MG capsule TAKE 1 CAPSULE(20 MG) BY MOUTH DAILY 30 capsule 2  . ondansetron (ZOFRAN-ODT) 4 MG disintegrating tablet Take 1-2 tablets (4-8 mg total) by mouth every 8 (eight) hours as needed for nausea. 60 tablet 3  . OVER THE  COUNTER MEDICATION OTC eye drops    . Oxcarbazepine (TRILEPTAL) 300 MG tablet Take 300 mg by mouth 2 (two) times daily.     No facility-administered medications prior to visit.    PAST MEDICAL HISTORY: Past Medical History:  Diagnosis Date  . Abdominal pain 05/29/2013   s/p rx of cephalo resistant e coli   but last rx NG  now residular ?  bladder sx repeat cx sx rx to ty and uro consult   . ADJ DISORDER WITH MIXED ANXIETY & DEPRESSED MOOD 03/03/2010   Qualifier: Diagnosis of  By: [name], [name]   . Agent resistant to multiple antibiotics 05/29/2013   e coli   bu NG on fu.    . ARF (acute renal failure) ([place]) 03/12/2015  . Closed head injury 02/01/2011   from syncope and had scalp laceration  neg ct .    Marland Kitchen Closed head injury 5-6 yrs ago  . Colitis 11/27/2017  .  Complication of anesthesia    migraine several hours after general anesthesia  . Fatty liver   . Gall stones 2016   see ct scan neg HIDA   . GERD (gastroesophageal reflux disease)   . Hearing aid worn   . HOH (hard of hearing)    both ears  . Hyperlipidemia   . Hypertension    echo nl lv function  mild dilitation 2009  . Kidney infection    few yrs ago in hospital  . Medication side effect 09/02/2010   Poss muscle se of 10 crestor   . Migraine    hypnic HA eval by Dr. Earley Favor in the past  . Polycythemia   . 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 Pyelonephritis 03/12/2015  . 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 Syncope 02/01/2011   In shower on vacation  sustained head laceration  8 sutures Had ed visit neg head ct labs and x ray   . Trigeminal neuralgia pain     PAST SURGICAL HISTORY: Past Surgical History:  Procedure Laterality Date  . ABDOMINAL HYSTERECTOMY  2002   tubal  . CARDIAC CATHETERIZATION  2000   chest pains neg  . CHOLECYSTECTOMY N/A 02/21/2017   Procedure: LAPAROSCOPIC CHOLECYSTECTOMY WITH INTRAOPERATIVE CHOLANGIOGRAM;  Surgeon: Armandina Gemma, MD;  Location: [facility];  Service: General;  Laterality: N/A;  . COLONOSCOPY     multiple  . CRANIOTOMY  12/09/2011   nerve decompression right trigeminal   . DOPPLER ECHOCARDIOGRAPHY  2009   nl lv function mild lv dilitation  . EYE SURGERY Bilateral    ioc for catatracts  . laparoscopic gallbladder surgery  02/16/2017   Fax from [facility]  . OOPHORECTOMY Bilateral 2002  . rt shoulder surgery      FAMILY HISTORY: Family History  Problem Relation Age of Onset  . Ovarian cancer Mother   . Stroke Mother   . Alcohol abuse Father   . Stroke Father   . Diabetes Brother   . Cancer Paternal Aunt        leukemia, unknown type  . Seizures Daughter   . Hypertension Other   . Colon cancer Neg Hx   . Dementia Neg Hx   . Alzheimer's disease Neg Hx     SOCIAL HISTORY: Social  History   Socioeconomic History  . Marital status: Married    Spouse name: Not on file  . Number of children: 2  . Years of education: Not  on file  . Highest education level: Not on file  Occupational History    Comment: retired Forensic psychologist  Tobacco Use  . Smoking status: Never Smoker  . Smokeless tobacco: Never Used  Vaping Use  . Vaping Use: Never used  Substance and Sexual Activity  . Alcohol use: Not Currently    Alcohol/week: 2.0 standard drinks    Types: 2 Glasses of wine per week    Comment: occ wine  . Drug use: No  . Sexual activity: Not on file  Other Topics Concern  . Not on file  Social History Narrative   Married   HH of 2-3 (god daughter)   Pets 2 dogs   Non smoker    Child is a physician   G2P2      Caffeine: 2 cups/day   Social Determinants of Health   Financial Resource Strain: Low Risk   . Difficulty of Paying Living Expenses: Not hard at all  Food Insecurity: No Food Insecurity  . Worried About Charity fundraiser in the Last Year: Never true  . Ran Out of Food in the Last Year: Never true  Transportation Needs: No Transportation Needs  . Lack of Transportation (Medical): No  . Lack of Transportation (Non-Medical): No  Physical Activity: Inactive  . Days of Exercise per Week: 0 days  . Minutes of Exercise per Session: 0 min  Stress: No Stress Concern Present  . Feeling of Stress : Not at all  Social Connections: Moderately Integrated  . Frequency of Communication with Friends and Family: More than three times a week  . Frequency of Social Gatherings with Friends and Family: Once a week  . Attends Religious Services: 1 to 4 times per year  . Active Member of Clubs or Organizations: No  . Attends Archivist Meetings: Never  . Marital Status: Married  Human resources officer Violence: Not At Risk  . Fear of Current or Ex-Partner: No  . Emotionally Abused: No  . Physically Abused: No  . Sexually Abused: No      PHYSICAL  EXAM  Vitals:   07/16/20 0859  BP: (!) 160/80  Pulse: 65  Weight: 129 lb (58.5 kg)  Height: 5' 6"  (1.676 m)   Body mass index is 20.82 kg/m.  Generalized: Well developed, in no acute distress   Neurological examination  Mentation: Alert oriented to time, place, history taking. Follows all commands speech and language fluent Cranial nerve II-XII: Pupils were equal round reactive to light. Extraocular movements were full, visual field were full on confrontational test.  Head turning and shoulder shrug  were normal and symmetric. Motor: The motor testing reveals 5 over 5 strength of all 4 extremities. Good symmetric motor tone is noted throughout.  Sensory: Sensory testing is intact to soft touch on all 4 extremities. No evidence of extinction is noted.  Coordination: Cerebellar testing reveals good finger-nose-finger and heel-to-shin bilaterally.  Gait and station: Patient uses a cane.  Gait is unsteady.  Tandem gait not attempted. Reflexes: Deep tendon reflexes are symmetric and normal bilaterally.   DIAGNOSTIC DATA (LABS, IMAGING, TESTING) - I reviewed patient records, labs, notes, testing and imaging myself where available.  Lab Results  Component Value Date   WBC 5.3 07/08/2020   HGB 14.0 07/08/2020   HCT 41.9 07/08/2020   MCV 91 07/08/2020   PLT 226 07/08/2020      Component Value Date/Time   NA 140 07/08/2020 1653   K 4.3 07/08/2020 1653  CL 99 07/08/2020 1653   CO2 26 07/08/2020 1653   GLUCOSE 93 07/08/2020 1653   GLUCOSE 94 07/01/2020 1449   GLUCOSE 97 03/17/2006 0820   BUN 18 07/08/2020 1653   CREATININE 0.99 07/08/2020 1653   CALCIUM 9.8 07/08/2020 1653   PROT 6.6 07/08/2020 1653   ALBUMIN 4.4 07/08/2020 1653   AST 24 07/08/2020 1653   ALT 20 07/08/2020 1653   ALKPHOS 75 07/08/2020 1653   BILITOT 0.5 07/08/2020 1653   GFRNONAA 54 (L) 07/08/2020 1653   GFRNONAA >60 05/16/2020 0520   GFRAA 62 07/08/2020 1653   Lab Results  Component Value Date   CHOL  279 (H) 05/27/2019   HDL 118.80 05/27/2019   LDLCALC 145 (H) 05/27/2019   LDLDIRECT 135.9 10/16/2012   TRIG 78.0 05/27/2019   CHOLHDL 2 05/27/2019   Lab Results  Component Value Date   HGBA1C 5.5 05/25/2018   Lab Results  Component Value Date   VITAMINB12 933 (H) 07/01/2020   Lab Results  Component Value Date   TSH 0.88 07/01/2020      ASSESSMENT AND PLAN 80 y.o. year old female  has a past medical history of Abdominal pain (05/29/2013), ADJ DISORDER WITH MIXED ANXIETY & DEPRESSED MOOD (03/03/2010), Agent resistant to multiple antibiotics (05/29/2013), ARF (acute renal failure) ([place]) (03/12/2015), Closed head injury (02/01/2011), Closed head injury (5-6 yrs ago), Colitis (10/25/5372), Complication of anesthesia, Fatty liver, Gall stones (2016), GERD (gastroesophageal reflux disease), Hearing aid worn, HOH (hard of hearing), Hyperlipidemia, Hypertension, Kidney infection, Medication side effect (09/02/2010), Migraine, Polycythemia, Positive PPD, Pyelonephritis (03/12/2015), Sensation of pain in anesthetized distribution of trigeminal nerve, Syncope (02/01/2011), and Trigeminal neuralgia pain. here with:  1.  Trigeminal neuralgia 2.  Memory disturbance  The patient advises that spacing out her medication in the morning has been beneficial for her symptoms.  Her gait is still slightly unsteady.  I have advised that she should not try to ambulate if she feels unsteady.  Ideally we should try to reduce her dose of medication however this results in increased pain due to trigeminal neuralgia.  Dr. Lavell Anchors noted at the last visit that she would reach out to Dr. Vertell Limber to see if he had any other treatment options for the patient.  Patient is amenable to plan.  She will keep her follow-up in May with Dr. Lavell Anchors   I spent 30 minutes of face-to-face and non-face-to-face time with patient.  This included previsit chart review, lab review, study review, order entry, electronic health record documentation,  patient education.  [name], MSN, NP-C 07/16/2020, 9:07 AM [facility] [address], [place], [place] [address] [phone]

## 2020-07-18 ENCOUNTER — Other Ambulatory Visit: Payer: Self-pay | Admitting: Internal Medicine

## 2020-07-20 ENCOUNTER — Other Ambulatory Visit: Payer: Self-pay

## 2020-07-20 ENCOUNTER — Ambulatory Visit: Payer: Medicare Other | Admitting: Internal Medicine

## 2020-07-20 ENCOUNTER — Encounter: Payer: Self-pay | Admitting: Internal Medicine

## 2020-07-20 ENCOUNTER — Telehealth: Payer: Self-pay

## 2020-07-20 VITALS — BP 116/74 | HR 66 | Temp 97.5°F | Ht 66.0 in | Wt 126.6 lb

## 2020-07-20 DIAGNOSIS — Z8616 Personal history of COVID-19: Secondary | ICD-10-CM

## 2020-07-20 DIAGNOSIS — Z79899 Other long term (current) drug therapy: Secondary | ICD-10-CM | POA: Diagnosis not present

## 2020-07-20 DIAGNOSIS — R2689 Other abnormalities of gait and mobility: Secondary | ICD-10-CM | POA: Diagnosis not present

## 2020-07-20 DIAGNOSIS — I10 Essential (primary) hypertension: Secondary | ICD-10-CM

## 2020-07-20 DIAGNOSIS — G500 Trigeminal neuralgia: Secondary | ICD-10-CM

## 2020-07-20 DIAGNOSIS — R278 Other lack of coordination: Secondary | ICD-10-CM

## 2020-07-20 NOTE — Patient Instructions (Signed)
Will fax the form to [facility].   Plan ROV in 6 months or as needed.   Continue . Fu with neurology evaluation.

## 2020-07-20 NOTE — Progress Notes (Signed)
Chief Complaint  Patient presents with  . Follow-up    4 month f/u no concerns.     HPI: [name] 80 y.o. come in for Chronic disease management  Change in mental clarity   Over last month .  She had MRI of head C-spine medical evaluation with the neurology team.  Was felt that taking her medications differently would be helpful and appears to have not had a recurrence of the handwriting mental processing issue.  She is to follow-up with neurology and perhaps [name] to see if there are other options for her pain.  The medication she is on now does control the heat GEN pain Can make her feel wobbly.  Puffy feet still there despite his diuretics not worse.  They are to moved to [facility] out of a large house which is a major change. Her husband has mobility issues.  Using a cane outside the house inside house no assisted.  No recent falls but has a past history of falls ROS: See pertinent positives and negatives per HPI.  Past Medical History:  Diagnosis Date  . Abdominal pain 05/29/2013   s/p rx of cephalo resistant e coli   but last rx NG  now residular ?  bladder sx repeat cx sx rx to ty and uro consult   . ADJ DISORDER WITH MIXED ANXIETY & DEPRESSED MOOD 03/03/2010   Qualifier: Diagnosis of  By: Angela Bill MD, [name]   . Agent resistant to multiple antibiotics 05/29/2013   e coli   bu NG on fu.    . ARF (acute renal failure) ([place]) 03/12/2015  . Closed head injury 02/01/2011   from syncope and had scalp laceration  neg ct .    Marland Kitchen Closed head injury 5-6 yrs ago  . Colitis 11/27/2017  . Complication of anesthesia    migraine several hours after general anesthesia  . Fatty liver   . Gall stones 2016   see ct scan neg HIDA   . GERD (gastroesophageal reflux disease)   . Hearing aid worn   . HOH (hard of hearing)    both ears  . Hyperlipidemia   . Hypertension    echo nl lv function  mild dilitation 2009  . Kidney infection    few yrs ago in hospital  . Medication  side effect 09/02/2010   Poss muscle se of 10 crestor   . Migraine    hypnic HA eval by [name] in the past  . Polycythemia   . 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 Pyelonephritis 03/12/2015  . 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 Syncope 02/01/2011   In shower on vacation  sustained head laceration  8 sutures Had ed visit neg head ct labs and x ray   . Trigeminal neuralgia pain     Family History  Problem Relation Age of Onset  . Ovarian cancer Mother   . Stroke Mother   . Alcohol abuse Father   . Stroke Father   . Diabetes Brother   . Cancer Paternal Aunt        leukemia, unknown type  . Seizures Daughter   . Hypertension Other   . Colon cancer Neg Hx   . Dementia Neg Hx   . Alzheimer's disease Neg Hx     Social History   Socioeconomic History  . Marital status: Married    Spouse name: Not on file  . Number  of children: 2  . Years of education: Not on file  . Highest education level: Not on file  Occupational History    Comment: retired Forensic psychologist  Tobacco Use  . Smoking status: Never Smoker  . Smokeless tobacco: Never Used  Vaping Use  . Vaping Use: Never used  Substance and Sexual Activity  . Alcohol use: Not Currently    Alcohol/week: 2.0 standard drinks    Types: 2 Glasses of wine per week    Comment: occ wine  . Drug use: No  . Sexual activity: Not on file  Other Topics Concern  . Not on file  Social History Narrative   Married   HH of 2-3 (god daughter)   Pets 2 dogs   Non smoker    Child is a physician   G2P2      Caffeine: 2 cups/day   Social Determinants of Health   Financial Resource Strain: Low Risk   . Difficulty of Paying Living Expenses: Not hard at all  Food Insecurity: No Food Insecurity  . Worried About Charity fundraiser in the Last Year: Never true  . Ran Out of Food in the Last Year: Never true  Transportation Needs: No Transportation Needs  . Lack of Transportation (Medical): No  .  Lack of Transportation (Non-Medical): No  Physical Activity: Inactive  . Days of Exercise per Week: 0 days  . Minutes of Exercise per Session: 0 min  Stress: No Stress Concern Present  . Feeling of Stress : Not at all  Social Connections: Moderately Integrated  . Frequency of Communication with Friends and Family: More than three times a week  . Frequency of Social Gatherings with Friends and Family: Once a week  . Attends Religious Services: 1 to 4 times per year  . Active Member of Clubs or Organizations: No  . Attends Archivist Meetings: Never  . Marital Status: Married    Outpatient Medications Prior to Visit  Medication Sig Dispense Refill  . Acetaminophen (TYLENOL PO) Take 2 tablets by mouth as needed.    Marland Kitchen alendronate (FOSAMAX) 70 MG tablet TAKE 1 TABLET BY MOUTH ONCE A WEEK, AS DIRECTED WITH FULL GLASS OF WATER ON EMPTY STOMACH 4 tablet 3  . benazepril (LOTENSIN) 20 MG tablet TAKE 1 TABLET(20 MG) BY MOUTH DAILY 90 tablet 1  . butalbital-acetaminophen-caffeine (FIORICET WITH CODEINE) 50-325-40-30 MG capsule Take 1 capsule by mouth every 4 (four) hours as needed for headache. Rescue 30 capsule 0  . carvedilol (COREG) 25 MG tablet TAKE 1 TABLET(25 MG) BY MOUTH TWICE DAILY WITH A MEAL 60 tablet 3  . cloNIDine (CATAPRES) 0.1 MG tablet TAKE 1 TABLET(0.1 MG) BY MOUTH TWICE DAILY 60 tablet 3  . conjugated estrogens (PREMARIN) vaginal cream 2 (two) times a week.    . fluticasone (FLONASE) 50 MCG/ACT nasal spray Place 2 sprays into both nostrils as needed for allergies or rhinitis. 16 g 5  . furosemide (LASIX) 20 MG tablet Take 20 mg by mouth daily.    Marland Kitchen lamoTRIgine (LAMICTAL) 100 MG tablet Take 1 tablet (100 mg total) by mouth daily. (Patient taking differently: Take 200 mg by mouth 2 (two) times daily.) 60 tablet 3  . Multiple Vitamin (MULTIVITAMIN ADULT PO) Take 1 tablet by mouth.    Marland Kitchen omeprazole (PRILOSEC) 20 MG capsule TAKE 1 CAPSULE(20 MG) BY MOUTH DAILY 30 capsule 0  .  ondansetron (ZOFRAN-ODT) 4 MG disintegrating tablet Take 1-2 tablets (4-8 mg total) by mouth every  8 (eight) hours as needed for nausea. 60 tablet 3  . OVER THE COUNTER MEDICATION OTC eye drops    . Oxcarbazepine (TRILEPTAL) 300 MG tablet Take 300 mg by mouth 2 (two) times daily.     No facility-administered medications prior to visit.     EXAM:  BP 116/74   Pulse 66   Temp (!) 97.5 F (36.4 C) (Oral)   Ht 5' 6"  (1.676 m)   Wt 126 lb 9.6 oz (57.4 kg)   SpO2 99%   BMI 20.43 kg/m   Body mass index is 20.43 kg/m.  GENERAL: vitals reviewed and listed above, alert, oriented, appears well hydrated and in no acute distress seems to have better mental clarity today with normal conversation.  Did not test her handwriting HEENT: atraumatic, conjunctiva  clear, no obvious abnormalities on inspection of external nose and ears OP : masked  NECK: no obvious masses on inspection palpation  LUNGS: clear to auscultation bilaterally, no wheezes, rales or rhonchi, good air movement CV: HRRR, no clubbing cyanosis +2 puffy feet nl cap refill  MS: moves all extremities without noticeable focal  abnormality able to arise from chair pushoff walk after assessing upright quickly back to chair and sit. PSYCH: pleasant and cooperative, no obvious depression or anxiety Lab Results  Component Value Date   WBC 5.3 07/08/2020   HGB 14.0 07/08/2020   HCT 41.9 07/08/2020   PLT 226 07/08/2020   GLUCOSE 93 07/08/2020   CHOL 279 (H) 05/27/2019   TRIG 78.0 05/27/2019   HDL 118.80 05/27/2019   LDLDIRECT 135.9 10/16/2012   LDLCALC 145 (H) 05/27/2019   ALT 20 07/08/2020   AST 24 07/08/2020   NA 140 07/08/2020   K 4.3 07/08/2020   CL 99 07/08/2020   CREATININE 0.99 07/08/2020   BUN 18 07/08/2020   CO2 26 07/08/2020   TSH 0.88 07/01/2020   INR 1.1 05/16/2020   HGBA1C 5.5 05/25/2018   BP Readings from Last 3 Encounters:  07/20/20 116/74  07/16/20 (!) 160/80  07/08/20 (!) 159/92    ASSESSMENT AND  PLAN:  Discussed the following assessment and plan:  Medication management  Balance problem  Essential hypertension  Trigeminal neuralgia  History of 2019 novel coronavirus disease (COVID-19)  Worsened handwriting - Improved It appears that her medications may have been part of the cause of her symptoms last visit .  Although uncertain baseline and I believe she was referred for neuropsych evaluation to get baseline.which may be helpful  Edema has not been responsive to Lasix although is helped in the morning and only sporadically uses compression stockings. [facility]Patient advised to return or notify health care team  if  new concerns arise.  Patient Instructions  Will fax the form to [facility].   Plan ROV in 6 months or as needed.   Continue . Fu with neurology evaluation.        [name]. Tora Prunty M.D.

## 2020-07-20 NOTE — Telephone Encounter (Signed)
Form filled out/faxed to [employer] @ [phone]. Dm/cma

## 2020-07-22 DIAGNOSIS — Z1231 Encounter for screening mammogram for malignant neoplasm of breast: Secondary | ICD-10-CM | POA: Diagnosis not present

## 2020-07-22 LAB — HM MAMMOGRAPHY

## 2020-07-24 ENCOUNTER — Other Ambulatory Visit: Payer: Self-pay

## 2020-07-24 MED ORDER — BENAZEPRIL HCL 20 MG PO TABS: 20.0000 mg | ORAL_TABLET | Freq: Two times a day (BID) | ORAL | 0 refills | Status: DC

## 2020-07-24 NOTE — Telephone Encounter (Signed)
Tell her to stop the Clonidine and call in Losartan 50 mg once daily, #30 with no rf. Follow up with Dr. Regis Bill in 3-4 weeks

## 2020-07-24 NOTE — Telephone Encounter (Signed)
Pt is already taking Benazepril 20 mg daily, please advise

## 2020-07-24 NOTE — Telephone Encounter (Signed)
I understand. Please ignore my last message. Stop the Clonidine, and increase the Benazepril to 20 mg BID. If needed, call in #60 with no rf

## 2020-07-24 NOTE — Telephone Encounter (Signed)
Pr Rx for Benazepril 20 was increased by Dr Sarajane Jews from daily to BID, Refill sent to pt pharmacy, pt notified and f/u appointment scheduled with Dr Regis Bill, pt verbalized understanding

## 2020-07-25 ENCOUNTER — Other Ambulatory Visit: Payer: Self-pay | Admitting: Family Medicine

## 2020-07-28 ENCOUNTER — Other Ambulatory Visit: Payer: Self-pay

## 2020-07-28 ENCOUNTER — Other Ambulatory Visit: Payer: Self-pay | Admitting: Internal Medicine

## 2020-07-30 ENCOUNTER — Other Ambulatory Visit: Payer: Self-pay | Admitting: Internal Medicine

## 2020-07-30 ENCOUNTER — Ambulatory Visit (INDEPENDENT_AMBULATORY_CARE_PROVIDER_SITE_OTHER): Payer: Medicare Other | Admitting: Pharmacist

## 2020-07-30 DIAGNOSIS — G500 Trigeminal neuralgia: Secondary | ICD-10-CM

## 2020-07-30 DIAGNOSIS — I10 Essential (primary) hypertension: Secondary | ICD-10-CM

## 2020-07-30 DIAGNOSIS — M1990 Unspecified osteoarthritis, unspecified site: Secondary | ICD-10-CM

## 2020-07-30 MED ORDER — FUROSEMIDE 20 MG PO TABS: 20.0000 mg | ORAL_TABLET | Freq: Every day | ORAL | 0 refills | Status: DC

## 2020-07-30 NOTE — Progress Notes (Signed)
Chronic Care Management Pharmacy Note  07/30/2020 Name:  [name] MRN:  [id number] DOB:  February 17, 1941  Subjective: [name] is an 80 y.o. year old female who is a primary patient of [name], [name], MD.  The CCM team was consulted for assistance with disease management and care coordination needs.    Engaged with patient by telephone for follow up visit in response to provider referral for pharmacy case management and/or care coordination services.   Consent to Services:  The patient was given information about Chronic Care Management services, agreed to services, and gave verbal consent prior to initiation of services.  Please see initial visit note for detailed documentation.   Patient Care Team: [name], [name], MD as PCP - General [name], MD as PCP - Cardiology (Cardiology) Cindie Crumbly (Neurosurgery) Irine Seal, MD as Attending Physician (Urology) Erline Levine, MD as Consulting Physician (Neurosurgery) [name], MD as Consulting Physician (Neurology) Viona Gilmore, [facility] as Pharmacist (Pharmacist)  Recent office visits: 07/24/20 Telephone encounter: Benazepril was increased to 20 mg BID and clonidine d/c'd.  07/20/20 [name], MD: Patient presented for chronic conditions follow up.   07/01/20 [name], MD: Patient presented for change in memory.  05/25/20 [name], MD: Patient presented for video visit for COVID19 infection.  04/22/20 [name], MD: Patient presented for video visit for chronic conditions to discuss referral to [facility] rehab.  03/25/20 Charlott Rakes, LPN: Patient presented for medicare annual wellness exam.   03/17/20 [name], MD: Patient presented for follow up for chronic conditions. Patient received influenza vaccine. Recommended taking omeprazole 4-5 days per week. Follow up in 4-6 months.  12/16/19 [name], MD: Patient presented for follow up for migraine. Prescribed rimegepant  75 mg PRN  Recent consult visits: 07/16/20 [name], NP (neurology): Patient presented for trigeminal neuralgia follow up.   07/08/20 Sarina Ill, MD (neurology): Patient presented for memory loss. Referral placed to neuropsychology.  06/03/20 Fenton Malling, NP (neurosurgery & spine): Patient presented for follow up. Unable to access notes.  05/01/20 Sarina Ill, MD (neurology): Patient presented for follow up. Increased lamotrigine to 100 mg BID based on level.  03/05/20 [name], MD (cardiology): Patient presented for follow up for edema, HTN, and HLD. Given age and LDL will not start a statin. Follow up in 6 months.  -02/27/20 Erskine Emery, PA-C (ortho): Patient presented for evaluation of pain in right leg. X-ray showed no acute fractures. Recommend compression stockings.   Hospital visits: 05/15/20 Patient presented to the ED with COVID.  Objective:  Lab Results  Component Value Date   CREATININE 0.99 07/08/2020   BUN 18 07/08/2020   GFR 59.39 (L) 07/01/2020   GFRNONAA 54 (L) 07/08/2020   GFRAA 62 07/08/2020   NA 140 07/08/2020   K 4.3 07/08/2020   CALCIUM 9.8 07/08/2020   CO2 26 07/08/2020    Lab Results  Component Value Date/Time   HGBA1C 5.5 05/25/2018 10:52 AM   GFR 59.39 (L) 07/01/2020 02:49 PM   GFR 55.22 (L) 10/11/2019 10:31 AM    Last diabetic Eye exam: No results found for: HMDIABEYEEXA  Last diabetic Foot exam: No results found for: HMDIABFOOTEX   Lab Results  Component Value Date   CHOL 279 (H) 05/27/2019   HDL 118.80 05/27/2019   LDLCALC 145 (H) 05/27/2019   LDLDIRECT 135.9 10/16/2012   TRIG 78.0 05/27/2019   CHOLHDL 2 05/27/2019    Hepatic Function Latest Ref Rng & Units 07/08/2020 07/01/2020  05/15/2020  Total Protein 6.0 - 8.5 g/dL 6.6 6.0 6.0(L)  Albumin 3.7 - 4.7 g/dL 4.4 3.9 3.2(L)  AST 0 - 40 IU/L _0 ALT 0 - 32 IU/L _1 Alk Phosphatase 44 - 121 IU/L 75 59 54  Total Bilirubin 0.0 - 1.2 mg/dL 0.5 0.7 0.9   Bilirubin, Direct 0.0 - 0.3 mg/dL - 0.2 -    Lab Results  Component Value Date/Time   TSH 0.88 07/01/2020 02:49 PM   TSH 0.54 05/27/2019 10:45 AM   FREET4 0.74 05/30/2017 08:01 AM    CBC Latest Ref Rng & Units 07/08/2020 07/01/2020 05/16/2020  WBC 3.4 - 10.8 x10E3/uL 5.3 4.6 6.5  Hemoglobin 11.1 - 15.9 g/dL 14.0 13.3 12.7  Hematocrit 34.0 - 46.6 % 41.9 39.2 38.2  Platelets 150 - 450 x10E3/uL 226 216.0 203    Lab Results  Component Value Date/Time   VD25OH 19.97 (L) 05/25/2018 10:52 AM    Clinical ASCVD: No  The ASCVD Risk score Mikey Bussing DC Jr., et al., 2013) failed to calculate for the following reasons:   The 2013 ASCVD risk score is only valid for ages 48 to 68    Depression screen PHQ 2/9 03/25/2020 05/27/2019  Decreased Interest 0 0  Down, Depressed, Hopeless 0 0  PHQ - 2 Score 0 0  Some recent data might be hidden     Social History   Tobacco Use  Smoking Status Never Smoker  Smokeless Tobacco Never Used   BP Readings from Last 3 Encounters:  07/20/20 116/74  07/16/20 (!) 160/80  07/08/20 (!) 159/92   Pulse Readings from Last 3 Encounters:  07/20/20 66  07/16/20 65  07/08/20 60   Wt Readings from Last 3 Encounters:  07/20/20 126 lb 9.6 oz (57.4 kg)  07/16/20 129 lb (58.5 kg)  07/08/20 124 lb (56.2 kg)    Assessment/Interventions: Review of patient past medical history, allergies, medications, health status, including review of consultants reports, laboratory and other test data, was performed as part of comprehensive evaluation and provision of chronic care management services.   SDOH:  (Social Determinants of Health) assessments and interventions performed: No   CCM Care Plan  Allergies  Allergen Reactions  . Elemental Sulfur Hives and Itching  . Sulfamethoxazole-Trimethoprim Other (See Comments)    REACTION: unspecified    Medications Reviewed Today    Reviewed by [name], MD (Physician) on 07/20/20 at 1734  Med List Status: <None>   Medication Order Taking? Sig Documenting Provider Last Dose Status Informant  Acetaminophen (TYLENOL PO) [id number] Yes Take 2 tablets by mouth as needed. [provider] Taking Active Self           Med Note Burt Knack, ERICIA A   Sat May 16, 2020  2:13 AM) Used @ home PRN  alendronate (FOSAMAX) 70 MG tablet [id number] Yes TAKE 1 TABLET BY MOUTH ONCE A WEEK, AS DIRECTED WITH FULL GLASS OF WATER ON EMPTY STOMACH [name], [name], MD Taking Active   benazepril (LOTENSIN) 20 MG tablet [id number] Yes TAKE 1 TABLET(20 MG) BY MOUTH DAILY [name], [name], MD Taking Active Self  butalbital-acetaminophen-caffeine (FIORICET WITH CODEINE) [phone] MG capsule [id number] Yes Take 1 capsule by mouth every 4 (four) hours as needed for headache. Rescue [name], [name], MD Taking Active            Med Note Virgel Paling A   PJA May 16, 2020  2:10 AM) Has @ home PRN  carvedilol (  COREG) 25 MG tablet [id number] Yes TAKE 1 TABLET(25 MG) BY MOUTH TWICE DAILY WITH A MEAL [name], [name], MD Taking Active   cloNIDine (CATAPRES) 0.1 MG tablet [id number] Yes TAKE 1 TABLET(0.1 MG) BY MOUTH TWICE DAILY [name], [name], MD Taking Active Self           Med Note Virgel Paling A   Sat May 16, 2020  2:08 AM) 1 dose as of 12/24  conjugated estrogens (PREMARIN) vaginal cream [id number] Yes 2 (two) times a week. [provider] Taking Active   fluticasone (FLONASE) 50 MCG/ACT nasal spray [id number] Yes Place 2 sprays into both nostrils as needed for allergies or rhinitis. [name], [name], MD Taking Active Self  furosemide (LASIX) 20 MG tablet [id number] Yes Take 20 mg by mouth daily. [provider] Taking Active Self  lamoTRIgine (LAMICTAL) 100 MG tablet [id number] Yes Take 1 tablet (100 mg total) by mouth daily.  Patient taking differently: Take 200 mg by mouth 2 (two) times daily.   [name], MD Taking Active   Multiple Vitamin (MULTIVITAMIN ADULT PO) [id number] Yes Take 1 tablet by mouth.  [provider] Taking Active Self  omeprazole (PRILOSEC) 20 MG capsule [id number] Yes TAKE 1 CAPSULE(20 MG) BY MOUTH DAILY [name], [name], MD Taking Active   ondansetron (ZOFRAN-ODT) 4 MG disintegrating tablet [id number] Yes Take 1-2 tablets (4-8 mg total) by mouth every 8 (eight) hours as needed for nausea. [name], MD Taking Active Self           Med Note Vikki Ports   VQX May 16, 2020  2:13 AM) Has @ home PRN  OVER THE COUNTER MEDICATION [id number] Yes OTC eye drops [provider] Taking Active Self  Oxcarbazepine (TRILEPTAL) 300 MG tablet [id number] Yes Take 300 mg by mouth 2 (two) times daily. [provider] Taking Active Self          Patient Active Problem List   Diagnosis Date Noted  . Lab test positive for detection of COVID-19 virus 05/25/2020  . Abdominal pain 05/16/2020  . Hematoma of abdominal wall, initial encounter 05/16/2020  . 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 Radiculopathy due to lumbar intervertebral disc disorder 03/27/2019  . 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 Diarrhea 11/27/2017  . Unilateral headache 05/26/2015  . Gallstones 03/12/2015  . Nausea without vomiting 12/10/2014  . Essential hypertension 05/29/2014  . 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 Low back pain radiating to right leg 01/16/2013  . Sinus problem 11/20/2012  . Chronic headaches morning 10/19/2012  . Low sodium levels  133 10/19/2012  . Leg pain, posterior 08/16/2012  . Sciatic neuritis 08/16/2012  . Sleep disturbance, unspecified 08/16/2012  . Medication management 03/24/2012  . Preventative health care 09/24/2011  . Trigeminal neuralgia 09/24/2011  . Postmenopausal HRT (hormone replacement therapy) 09/24/2011  . Polycythemia 09/20/2011  . Morning headache 02/01/2011  . Hearing aid worn   . Abnormal blood finding   . LOCALIZED SUPERFICIAL SWELLING MASS OR LUMP  09/21/2009  . DYSPNEA 05/07/2008  . Hypnic headache 03/19/2008  . 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 Headache(784.0) 06/05/2007  . JAW PAIN 04/06/2007  . Osteoarthritis 04/06/2007  . ABNORMAL RESULT, FUNCTION STUDY, [place] 02/05/2007  . HYPERLIPIDEMIA 12/25/2006  . HYPERTENSION 12/25/2006    Immunization History  Administered Date(s) Administered  . Fluad Quad(high Dose 65+) 02/18/2019, 03/17/2020  . Influenza Split 03/10/2011, 02/02/2012  . Influenza Whole 02/18/2008,  03/03/2010  . Influenza, High Dose Seasonal PF 03/04/2014, 02/01/2016, 02/22/2017, 03/20/2018  . Influenza,inj,Quad PF,6+ Mos 03/05/2013  . PFIZER(Purple Top)SARS-COV-2 Vaccination 06/01/2019, 06/22/2019  . Pneumococcal Conjugate-13 11/13/2013  . Pneumococcal Polysaccharide-23 03/23/2015  . Td 05/23/1996  . Tdap 09/19/2011  . Zoster 09/19/2011  . Zoster Recombinat (Shingrix) 04/15/2020    Conditions to be addressed/monitored:  Hypertension, Hyperlipidemia, GERD, Osteoporosis and migraines/trigeminal neuralgia, dyspnea, pain  There are no care plans that you recently modified to display for this patient.   Medication Assistance: None required.  Patient affirms current coverage meets needs.  Patient's preferred pharmacy is:  [id number] [address], [address] [place] [address] Phone: [phone] Fax: [phone]  Uses pill box? Yes Pt endorses 100% compliance  We discussed: Current pharmacy is preferred with insurance plan and patient is satisfied with pharmacy services Patient decided to: Continue current medication management strategy  Care Plan and Follow Up Patient Decision:  Patient agrees to Care Plan and Follow-up.  Plan: Telephone follow up appointment with care management team member scheduled for:  6 months  [name], PharmD [facility] [place] at  [place] [phone]

## 2020-07-30 NOTE — Telephone Encounter (Signed)
I reviewed your medication list and none of the medicines on the list or significant for causing edema or swelling appendectomy and said anything is possible but I doubt it unless you noticed it was related when you started taking it  There are a few blood pressure medicines that can cause extra swelling but you are taking these.   Are you trying the lasix again?  You can try the Lasix again for short course but if we stay on it we will need to check blood work again with potassium and sodium levels.

## 2020-07-31 ENCOUNTER — Telehealth: Payer: Self-pay | Admitting: Internal Medicine

## 2020-07-31 NOTE — Telephone Encounter (Signed)
Pt is calling in stating that her feet and ankles are swollen and feel that it is one of her medications that is making them swell like they are.  Pt would like for Dr. Regis Bill to look at her medication and let her know which one it may be (carvedilol (COREG) 25 MG, furosemide (LASIX) 20 MG and benazepril (LOTENSIN) 20 MG) and give her a call.

## 2020-08-04 NOTE — Telephone Encounter (Signed)
LVM for callback to discuss message

## 2020-08-04 NOTE — Telephone Encounter (Signed)
Benazepril carvedilol and furosemide should not cause feet swelling  She could try doubling the dose of the Lasix and take 40 mg at a time which will make her urinate more for a few days to see if it goes down However if we remain at the higher dose we will have to recheck her chemistry level BMP to make sure the sodium level and the potassium are not out of whack.   Let us know if increasing her furosemide has helped her feet swelling in a week.

## 2020-08-05 NOTE — Telephone Encounter (Signed)
Patient informed of the message below. Message sent via Mychart per patient request.

## 2020-08-06 NOTE — Patient Instructions (Addendum)
Hi [name],   It was great to get to speak with you over the telephone again! Below is a summary of some of the topics we discussed.  As a reminder, please continue checking your blood pressures at home and keep a log of the readings.  Also don't forget to get your second dose of the shingles vaccine at your pharmacy within the next 2 months!  Please reach out to me if you have any questions or need anything before our follow up!  Best, [name]  [name], PharmD, [facility]  Visit Information  Goals Addressed   None    Patient Care Plan: [facility] Pharmacy Care Plan    Problem Identified: Problem: Hypertension, Hyperlipidemia, GERD, Osteoporosis and migraines/trigeminal neuralgia, dyspnea, pain     Long-Range Goal: Patient-Specific Goal   Start Date: 07/30/2020  Expected End Date: 07/30/2021  This Visit's Progress: On track  Priority: High  Note:   Current Barriers:  . Unable to independently monitor therapeutic efficacy  Pharmacist Clinical Goal(s):  Marland Kitchen Patient will achieve adherence to monitoring guidelines and medication adherence to achieve therapeutic efficacy through collaboration with PharmD and provider.   Interventions: . 1:1 collaboration with [name], [name], MD regarding development and update of comprehensive plan of care as evidenced by provider attestation and co-signature . Inter-disciplinary care team collaboration (see longitudinal plan of care) . Comprehensive medication review performed; medication list updated in electronic medical record  Hypertension (BP goal <140/90) -Uncontrolled -Current treatment:  Benazepril 20 mg 1 tablet twice daily  Carvedilol 25 mg 1 tablet BID   Furosemide 20 mg 1 tablet daily  -Medications previously tried: clonidine (swelling) -Current home readings: 180/70; could not provide others -Current dietary habits: eats some canned foods (chooses low sodium), eats  some frozen foods - chips, hamburgers, sandwiches, sausage - recommended looking at package labels  -Current exercise habits: doing physical therapy exercises; twice a week soft exercise program through the TV -Denies hypotensive/hypertensive symptoms -Educated on Exercise goal of 150 minutes per week; Importance of home blood pressure monitoring; Proper BP monitoring technique; -Counseled to monitor BP at home at least weekly, document, and provide log at future appointments -Counseled on diet and exercise extensively Recommended to continue current medication  Hyperlipidemia: (LDL goal < 100) -Uncontrolled -Current treatment: . No medications -Medications previously tried: none  -Current dietary patterns: did not discuss -Current exercise habits: doing physical therapy exercises; twice a week soft exercise program through the TV -Educated on Cholesterol goals;  Exercise goal of 150 minutes per week; -Counseled on diet and exercise extensively  Osteoporosis (Goal prevent fractures and improve bone density) -Controlled -Last DEXA Scan: 05/28/2018             T-Score femoral neck: R -1.3, L -2.0             T-Score total hip: n/a             T-Score lumbar spine: -0.9             T-Score forearm radius: n/a             10-year probability of major osteoporotic fracture: 21.4%             10-year probability of hip fracture: 6.0% -Patient is a candidate for pharmacologic treatment due to T-Score -1.0 to -2.5 and 10-year risk of major osteoporotic fracture > 20% and T-Score -1.0 to -2.5 and 10-year risk of hip fracture > 3% -Current treatment  Alendronate 70 mg 1 tablet weekly - sometimes takes on Tues and Mon - put them on top of coffee pot  Vitamin D  Calcium 1200 mg  -Medications previously tried: none  -Recommend [phone] units of vitamin D daily. Recommend 1200 mg of calcium daily from dietary and supplemental sources. Counseled on oral bisphosphonate administration: take in  the morning, 30 minutes prior to food with 6-8 oz of water. Do not lie down for at least 30 minutes after taking. Recommend weight-bearing and muscle strengthening exercises for building and maintaining bone density. -Recommended repeat vitamin D level   Migraines/trigeminal neuralgia (Goal: minimize pain and severity) -Controlled -Current treatment   Lamotrigine 100 mg 1 tablet twice daily  Oxcarbazepine 300 mg 1 tablet daily  Fioricet with codeine 50-325-40-30 mg 1 capsule as needed - not taking -Medications previously tried: none  -Recommended to continue current medication  GERD (Goal: minimize symptoms of acid reflux/heartburn) -Controlled -Current treatment  . Omeprazole 20 mg 1 capsule daily (twice weekly to protect stomach) -Medications previously tried: none  -Recommended to continue current medication  Pain (Goal: minimize pain) -Controlled -Current treatment   Tylenol 500 mg 2 tablets PRN  Tramadol 50 mg 1 tablet PRN -Medications previously tried: none  -Recommended to continue current medication   Health Maintenance -Vaccine gaps: COVID booster, second shingrix dose -Current therapy:   Ondansetron 4 mg ODT PRN  Premarin vaginal cream 1 applicatorful twice weekly  Systane eye drops PRN  Flonase 2 sprays as needed - gets more headaches during allergy season  Vitamin B (can't remember dose) -Educated on Cost vs benefit of each product must be carefully weighed by individual consumer -Patient is satisfied with current therapy and denies issues -Recommended to continue current medication  Patient Goals/Self-Care Activities . Patient will:  - take medications as prescribed check blood pressure at least weekly, document, and provide at future appointments  Follow Up Plan: Telephone follow up appointment with care management team member scheduled for: 6 months       Patient verbalizes understanding of instructions provided today and agrees to view in  [place].  Telephone follow up appointment with pharmacy team member scheduled for: 6 months  Viona Gilmore, [facility]  [email or web address]">  DASH Eating Plan DASH stands for Dietary Approaches to Stop Hypertension. The DASH eating plan is a healthy eating plan that has been shown to:  Reduce high blood pressure (hypertension).  Reduce your risk for type 2 diabetes, heart disease, and stroke.  Help with weight loss. What are tips for following this plan? Reading food labels  Check food labels for the amount of salt (sodium) per serving. Choose foods with less than 5 percent of the Daily Value of sodium. Generally, foods with less than 300 milligrams (mg) of sodium per serving fit into this eating plan.  To find whole grains, look for the word "whole" as the first word in the ingredient list. Shopping  Buy products labeled as "low-sodium" or "no salt added."  Buy fresh foods. Avoid canned foods and pre-made or frozen meals. Cooking  Avoid adding salt when cooking. Use salt-free seasonings or herbs instead of table salt or sea salt. Check with your health care provider or pharmacist before using salt substitutes.  Do not fry foods. Cook foods using healthy methods such as baking, boiling, grilling, roasting, and broiling instead.  Cook with heart-healthy oils, such as olive, canola, avocado, soybean, or sunflower oil. Meal planning  Eat a balanced diet that includes: ? 4 or more  servings of fruits and 4 or more servings of vegetables each day. Try to fill one-half of your plate with fruits and vegetables. ? 6-8 servings of whole grains each day. ? Less than 6 oz (170 g) of lean meat, poultry, or fish each day. A 3-oz (85-g) serving of meat is about the same size as a deck of cards. One egg equals 1 oz (28 g). ? 2-3 servings of low-fat dairy each day. One serving is 1 cup (237 mL). ? 1 serving of nuts, seeds, or beans 5 times each  week. ? 2-3 servings of heart-healthy fats. Healthy fats called omega-3 fatty acids are found in foods such as walnuts, flaxseeds, fortified milks, and eggs. These fats are also found in cold-water fish, such as sardines, salmon, and mackerel.  Limit how much you eat of: ? Canned or prepackaged foods. ? Food that is high in trans fat, such as some fried foods. ? Food that is high in saturated fat, such as fatty meat. ? Desserts and other sweets, sugary drinks, and other foods with added sugar. ? Full-fat dairy products.  Do not salt foods before eating.  Do not eat more than 4 egg yolks a week.  Try to eat at least 2 vegetarian meals a week.  Eat more home-cooked food and less restaurant, buffet, and fast food.   Lifestyle  When eating at a restaurant, ask that your food be prepared with less salt or no salt, if possible.  If you drink alcohol: ? Limit how much you use to:  0-1 drink a day for women who are not pregnant.  0-2 drinks a day for men. ? Be aware of how much alcohol is in your drink. In the [place] one drink equals one 12 oz bottle of beer (355 mL), one 5 oz glass of wine (148 mL), or one 1 oz glass of hard liquor (44 mL). General information  Avoid eating more than 2,300 mg of salt a day. If you have hypertension, you may need to reduce your sodium intake to 1,500 mg a day.  Work with your health care provider to maintain a healthy body weight or to lose weight. Ask what an ideal weight is for you.  Get at least 30 minutes of exercise that causes your heart to beat faster (aerobic exercise) most days of the week. Activities may include walking, swimming, or biking.  Work with your health care provider or dietitian to adjust your eating plan to your individual calorie needs. What foods should I eat? Fruits All fresh, dried, or frozen fruit. Canned fruit in natural juice (without added sugar). Vegetables Fresh or frozen vegetables (raw, steamed, roasted, or  grilled). Low-sodium or reduced-sodium tomato and vegetable juice. Low-sodium or reduced-sodium tomato sauce and tomato paste. Low-sodium or reduced-sodium canned vegetables. Grains Whole-grain or whole-wheat bread. Whole-grain or whole-wheat pasta. Brown rice. [place]. Bulgur. Whole-grain and low-sodium cereals. Pita bread. Low-fat, low-sodium crackers. Whole-wheat flour tortillas. Meats and other proteins Skinless chicken or [place]. Ground chicken or [place]. Pork with fat trimmed off. Fish and seafood. Egg whites. Dried beans, peas, or lentils. Unsalted nuts, nut butters, and seeds. Unsalted canned beans. Lean cuts of beef with fat trimmed off. Low-sodium, lean precooked or cured meat, such as sausages or meat loaves. Dairy Low-fat (1%) or fat-free (skim) milk. Reduced-fat, low-fat, or fat-free cheeses. Nonfat, low-sodium ricotta or cottage cheese. Low-fat or nonfat yogurt. Low-fat, low-sodium cheese. Fats and oils Soft margarine without trans fats. Vegetable oil. Reduced-fat, low-fat, or  light mayonnaise and salad dressings (reduced-sodium). Canola, safflower, olive, avocado, soybean, and sunflower oils. Avocado. Seasonings and condiments Herbs. Spices. Seasoning mixes without salt. Other foods Unsalted popcorn and pretzels. Fat-free sweets. The items listed above may not be a complete list of foods and beverages you can eat. Contact a dietitian for more information. What foods should I avoid? Fruits Canned fruit in a light or heavy syrup. Fried fruit. Fruit in cream or butter sauce. Vegetables Creamed or fried vegetables. Vegetables in a cheese sauce. Regular canned vegetables (not low-sodium or reduced-sodium). Regular canned tomato sauce and paste (not low-sodium or reduced-sodium). Regular tomato and vegetable juice (not low-sodium or reduced-sodium). Angie Fava. Olives. Grains Baked goods made with fat, such as croissants, muffins, or some breads. Dry pasta or rice meal packs. Meats  and other proteins Fatty cuts of meat. Ribs. Fried meat. Berniece Salines. Bologna, salami, and other precooked or cured meats, such as sausages or meat loaves. Fat from the back of a pig (fatback). Bratwurst. Salted nuts and seeds. Canned beans with added salt. Canned or smoked fish. Whole eggs or egg yolks. Chicken or [place] with skin. Dairy Whole or 2% milk, cream, and half-and-half. Whole or full-fat cream cheese. Whole-fat or sweetened yogurt. Full-fat cheese. Nondairy creamers. Whipped toppings. Processed cheese and cheese spreads. Fats and oils Butter. Stick margarine. Lard. Shortening. Ghee. Bacon fat. Tropical oils, such as coconut, palm kernel, or palm oil. Seasonings and condiments Onion salt, garlic salt, seasoned salt, table salt, and sea salt. Worcestershire sauce. Tartar sauce. Barbecue sauce. Teriyaki sauce. Soy sauce, including reduced-sodium. Steak sauce. Canned and packaged gravies. Fish sauce. Oyster sauce. Cocktail sauce. Store-bought horseradish. Ketchup. Mustard. Meat flavorings and tenderizers. Bouillon cubes. Hot sauces. Pre-made or packaged marinades. Pre-made or packaged taco seasonings. Relishes. Regular salad dressings. Other foods Salted popcorn and pretzels. The items listed above may not be a complete list of foods and beverages you should avoid. Contact a dietitian for more information. Where to find more information  National Heart, Lung, and Blood Institute: https://wilson-eaton.com/  American Heart Association: www.heart.org  Academy of Nutrition and Dietetics: www.eatright.[place]: www.kidney.org Summary  The DASH eating plan is a healthy eating plan that has been shown to reduce high blood pressure (hypertension). It may also reduce your risk for type 2 diabetes, heart disease, and stroke.  When on the DASH eating plan, aim to eat more fresh fruits and vegetables, whole grains, lean proteins, low-fat dairy, and heart-healthy fats.  With the DASH  eating plan, you should limit salt (sodium) intake to 2,300 mg a day. If you have hypertension, you may need to reduce your sodium intake to 1,500 mg a day.  Work with your health care provider or dietitian to adjust your eating plan to your individual calorie needs. This information is not intended to replace advice given to you by your health care provider. Make sure you discuss any questions you have with your health care provider. Document Revised: 04/12/2019 Document Reviewed: 04/12/2019 Elsevier Patient Education  2021 [employer].

## 2020-08-08 NOTE — Progress Notes (Signed)
I, I have personally reviewed this encounter including the documentation in this note and have collaborated with the care management provider regarding care management and care coordination activities to include development and update of the comprehensive care plan. I am certifying that I agree with the content of this note and encounter as supervising physician.

## 2020-08-11 NOTE — Telephone Encounter (Signed)
The patient called to update Dr. Regis Bill on the pill regimen that she was put on.  She followed the change in pill regimen.  Her ankles and feet are swollen  BP still high but not too high.  I reminded the patient that she has a follow up appointment with Dr. Regis Bill on Monday  She still wanted to updated Dr. Regis Bill in case she wanted to suggest anything until for her to do till Monday.

## 2020-08-16 ENCOUNTER — Other Ambulatory Visit: Payer: Self-pay | Admitting: Internal Medicine

## 2020-08-17 ENCOUNTER — Ambulatory Visit: Payer: Medicare Other | Admitting: Internal Medicine

## 2020-08-17 ENCOUNTER — Other Ambulatory Visit: Payer: Self-pay

## 2020-08-17 ENCOUNTER — Encounter: Payer: Self-pay | Admitting: Internal Medicine

## 2020-08-17 VITALS — BP 130/80 | HR 70 | Temp 98.3°F | Ht 66.0 in | Wt 127.2 lb

## 2020-08-17 DIAGNOSIS — Z79899 Other long term (current) drug therapy: Secondary | ICD-10-CM

## 2020-08-17 DIAGNOSIS — R2689 Other abnormalities of gait and mobility: Secondary | ICD-10-CM | POA: Diagnosis not present

## 2020-08-17 DIAGNOSIS — R0989 Other specified symptoms and signs involving the circulatory and respiratory systems: Secondary | ICD-10-CM | POA: Diagnosis not present

## 2020-08-17 DIAGNOSIS — H5213 Myopia, bilateral: Secondary | ICD-10-CM | POA: Diagnosis not present

## 2020-08-17 DIAGNOSIS — Z8616 Personal history of COVID-19: Secondary | ICD-10-CM

## 2020-08-17 DIAGNOSIS — R609 Edema, unspecified: Secondary | ICD-10-CM | POA: Diagnosis not present

## 2020-08-17 DIAGNOSIS — G500 Trigeminal neuralgia: Secondary | ICD-10-CM

## 2020-08-17 NOTE — Patient Instructions (Addendum)
Continue same medication and  And as per neurology .    Continue compression stockings  And let us know if   If you want a vascular opinion.about  Swelling    Poss venous insufficiency.    Consult with  Cards  For other ideas.     Wt Readings from Last 3 Encounters:  08/17/20 127 lb 3.2 oz (57.7 kg)  07/20/20 126 lb 9.6 oz (57.4 kg)  07/16/20 129 lb (58.5 kg)   Your BP is better today .  Plan  ROV   In  4 months or as needed.

## 2020-08-17 NOTE — Progress Notes (Signed)
Chief Complaint  Patient presents with  . Follow-up    Patient states her ankles are swollen due to medication increase    HPI: [name] 80 y.o. come in for follow-up of a number of issues. She is chagrined that she she had to move out of their home of many years to a friend area for medical aging reasons.  Recent move in the last week  BP has been up and down see reports Meds has been taking the diuretic does not think it helps that much Feet continue to be swollen will wear compression stockings keeps her feet up at night.  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  No current chest pain shortness of breath states that her computer was lost in the move so far so she does not have a list of when her med next appointments are uses my chart a good bit.  The follow-up with cardiology denies any chest pain or syncope.  Past Medical History:  Diagnosis Date  . Abdominal pain 05/29/2013   s/p rx of cephalo resistant e coli   but last rx NG  now residular ?  bladder sx repeat cx sx rx to ty and uro consult   . ADJ DISORDER WITH MIXED ANXIETY & DEPRESSED MOOD 03/03/2010   Qualifier: Diagnosis of  By: [name], [name]   . Agent resistant to multiple antibiotics 05/29/2013   e coli   bu NG on fu.    . ARF (acute renal failure) ([place]) 03/12/2015  . Closed head injury 02/01/2011   from syncope and had scalp laceration  neg ct .    Marland Kitchen Closed head injury 5-6 yrs ago  . Colitis 11/27/2017  . Complication of anesthesia    migraine several hours after  general anesthesia  . Fatty liver   . Gall stones 2016   see ct scan neg HIDA   . GERD (gastroesophageal reflux disease)   . Hearing aid worn   . HOH (hard of hearing)    both ears  . Hyperlipidemia   . Hypertension    echo nl lv function  mild dilitation 2009  . Kidney infection    few yrs ago in hospital  . Medication side effect 09/02/2010   Poss muscle se of 10 crestor   . Migraine    hypnic HA eval by Dr. Earley Favor in the past  . Polycythemia   . 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 Pyelonephritis 03/12/2015  . 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 Syncope 02/01/2011   In shower on vacation  sustained head laceration  8 sutures Had ed visit neg head ct labs and x ray   . Trigeminal neuralgia pain     Family History  Problem Relation Age of Onset  . Ovarian cancer Mother   . Stroke Mother   . Alcohol abuse Father   . Stroke Father   . Diabetes Brother   .  Cancer Paternal Aunt        leukemia, unknown type  . Seizures Daughter   . Hypertension Other   . Colon cancer Neg Hx   . Dementia Neg Hx   . Alzheimer's disease Neg Hx     Social History   Socioeconomic History  . Marital status: Married    Spouse name: Not on file  . Number of children: 2  . Years of education: Not on file  . Highest education level: Not on file  Occupational History    Comment: retired Forensic psychologist  Tobacco Use  . Smoking status: Never Smoker  . Smokeless tobacco: Never Used  Vaping Use  . Vaping Use: Never used  Substance and Sexual Activity  . Alcohol use: Not Currently    Alcohol/week: 2.0 standard drinks    Types: 2 Glasses of wine per week    Comment: occ wine  . Drug use: No  . Sexual activity: Not on file  Other Topics Concern  . Not on file  Social History Narrative   Married   HH of 2-3 (god daughter)   Pets 2 dogs   Non smoker    Child is a physician   G2P2      Caffeine: 2 cups/day   Social Determinants of Health   Financial  Resource Strain: Low Risk   . Difficulty of Paying Living Expenses: Not hard at all  Food Insecurity: No Food Insecurity  . Worried About Charity fundraiser in the Last Year: Never true  . Ran Out of Food in the Last Year: Never true  Transportation Needs: No Transportation Needs  . Lack of Transportation (Medical): No  . Lack of Transportation (Non-Medical): No  Physical Activity: Inactive  . Days of Exercise per Week: 0 days  . Minutes of Exercise per Session: 0 min  Stress: No Stress Concern Present  . Feeling of Stress : Not at all  Social Connections: Moderately Integrated  . Frequency of Communication with Friends and Family: More than three times a week  . Frequency of Social Gatherings with Friends and Family: Once a week  . Attends Religious Services: 1 to 4 times per year  . Active Member of Clubs or Organizations: No  . Attends Archivist Meetings: Never  . Marital Status: Married    Outpatient Medications Prior to Visit  Medication Sig Dispense Refill  . Acetaminophen (TYLENOL PO) Take 2 tablets by mouth as needed.    Marland Kitchen alendronate (FOSAMAX) 70 MG tablet TAKE 1 TABLET BY MOUTH ONCE A WEEK, AS DIRECTED WITH FULL GLASS OF WATER ON EMPTY STOMACH 4 tablet 3  . benazepril (LOTENSIN) 20 MG tablet Take 1 tablet (20 mg total) by mouth 2 (two) times daily. 60 tablet 0  . butalbital-acetaminophen-caffeine (FIORICET WITH CODEINE) 50-325-40-30 MG capsule Take 1 capsule by mouth every 4 (four) hours as needed for headache. Rescue 30 capsule 0  . carvedilol (COREG) 25 MG tablet TAKE 1 TABLET(25 MG) BY MOUTH TWICE DAILY WITH A MEAL 60 tablet 3  . conjugated estrogens (PREMARIN) vaginal cream 2 (two) times a week.    . fluticasone (FLONASE) 50 MCG/ACT nasal spray Place 2 sprays into both nostrils as needed for allergies or rhinitis. 16 g 5  . furosemide (LASIX) 20 MG tablet Take 1 tablet (20 mg total) by mouth daily. 30 tablet 0  . lamoTRIgine (LAMICTAL) 100 MG tablet Take 1  tablet (100 mg total) by mouth daily. (Patient taking differently: Take  200 mg by mouth 2 (two) times daily.) 60 tablet 3  . Multiple Vitamin (MULTIVITAMIN ADULT PO) Take 1 tablet by mouth.    Marland Kitchen omeprazole (PRILOSEC) 20 MG capsule TAKE 1 CAPSULE(20 MG) BY MOUTH DAILY (Patient taking differently: Take 20 mg by mouth 2 (two) times a week.) 30 capsule 0  . ondansetron (ZOFRAN-ODT) 4 MG disintegrating tablet Take 1-2 tablets (4-8 mg total) by mouth every 8 (eight) hours as needed for nausea. 60 tablet 3  . OVER THE COUNTER MEDICATION OTC eye drops    . Oxcarbazepine (TRILEPTAL) 300 MG tablet Take 300 mg by mouth 2 (two) times daily.     No facility-administered medications prior to visit.     EXAM:  BP 130/80 (BP Location: Left Arm, Patient Position: Sitting, Cuff Size: Normal)   Pulse 70   Temp 98.3 F (36.8 C) (Oral)   Ht 5' 6"  (1.676 m)   Wt 127 lb 3.2 oz (57.7 kg)   SpO2 95%   BMI 20.53 kg/m   Body mass index is 20.53 kg/m.  GENERAL: vitals reviewed and listed above, alert, oriented, appears well hydrated and in no acute distress HEENT: atraumatic, conjunctiva  clear, no obvious abnormalities on inspection of external nose and ears OP : Masked NECK: no obvious masses on inspection palpation  LUNGS: clear to auscultation bilaterally, no wheezes, rales or rhonchi, good air movement CV: HRRR, no clubbing cyanosis or feet compression socks still puffy +2 +has a number of superficial feeding bruises look more like scratches perhaps MS: moves all extremities without noticeable focal  Abnormality Gait independent served when arising a little bit wide-based but no tremor in speech and conversation appeal normal today PSYCH: pleasant and cooperative, a bit down when speaking of the need for her move and her grandchildren situation Lab Results  Component Value Date   WBC 5.3 07/08/2020   HGB 14.0 07/08/2020   HCT 41.9 07/08/2020   PLT 226 07/08/2020   GLUCOSE 93 07/08/2020   CHOL 279  (H) 05/27/2019   TRIG 78.0 05/27/2019   HDL 118.80 05/27/2019   LDLDIRECT 135.9 10/16/2012   LDLCALC 145 (H) 05/27/2019   ALT 20 07/08/2020   AST 24 07/08/2020   NA 140 07/08/2020   K 4.3 07/08/2020   CL 99 07/08/2020   CREATININE 0.99 07/08/2020   BUN 18 07/08/2020   CO2 26 07/08/2020   TSH 0.88 07/01/2020   INR 1.1 05/16/2020   HGBA1C 5.5 05/25/2018   BP Readings from Last 3 Encounters:  08/17/20 130/80  07/20/20 116/74  07/16/20 (!) 160/80    ASSESSMENT AND PLAN:  Discussed the following assessment and plan:  Edema, unspecified type  Labile hypertension - ok today   Medication management  Balance problem  Trigeminal neuralgia  History of 2019 novel coronavirus disease (COVID-19) Edema seemingly minimally responsive to double dose of Lasix 40 mg if she is taking the 40 mg dose uncertain.  Has a follow-up with cardiology soon would have Dr. Harrell Gave also opine on her edema consideration of venous vascular evaluation and or intensification of diuretic with caution (she has had a history of hyponatremia on HCTZ)  Her cognitive skills seem improved today but did not do any formal testing vies keep her follow-up with Dr. Lavell Anchors.  -Patient advised to return or notify health care team  if  new concerns arise.  Patient Instructions   Continue same medication and  And as per neurology .    Continue compression stockings  And let  us know if   If you want a vascular opinion.about  Swelling    Poss venous insufficiency.    Consult with  Cards  For other ideas.     Wt Readings from Last 3 Encounters:  08/17/20 127 lb 3.2 oz (57.7 kg)  07/20/20 126 lb 9.6 oz (57.4 kg)  07/16/20 129 lb (58.5 kg)   Your BP is better today .  Plan  ROV   In  4 months or as needed.   [name]. Samanatha Brammer M.D.

## 2020-08-20 NOTE — Telephone Encounter (Signed)
See  Office visit

## 2020-08-26 ENCOUNTER — Telehealth: Payer: Self-pay | Admitting: Adult Health

## 2020-08-26 MED ORDER — LAMOTRIGINE 100 MG PO TABS: 100.0000 mg | ORAL_TABLET | Freq: Two times a day (BID) | ORAL | 3 refills | Status: DC

## 2020-08-26 NOTE — Telephone Encounter (Signed)
I called [facility] last fill was 07-27-20 #60.  Taking lamotrigine 167m po daily.  I relayed that is changed.  Updated script and sent electronically.  (last filled 06-11-20 Dr. AJaynee Eagles.

## 2020-08-26 NOTE — Addendum Note (Signed)
Addended by: Brandon Melnick on: 08/26/2020 02:27 PM   Modules accepted: Orders

## 2020-08-26 NOTE — Telephone Encounter (Signed)
Called and spoke to pt. She is taking total of 131m po bid lamotrigine daily. (combo of 1023mand 2581mablets).  Husband in background confirming this.

## 2020-08-26 NOTE — Telephone Encounter (Signed)
Pt states due to her lamoTRIgine (LAMICTAL) 100 MG tablet being doubled by Dr Jaynee Eagles she is out, has none for tonight. Pt is asking a new prescription can be called into  [facility] #[id number]

## 2020-08-27 ENCOUNTER — Other Ambulatory Visit: Payer: Self-pay | Admitting: Internal Medicine

## 2020-09-09 ENCOUNTER — Telehealth: Payer: Medicare Other | Admitting: Neurology

## 2020-09-17 ENCOUNTER — Telehealth: Payer: Self-pay | Admitting: Internal Medicine

## 2020-09-17 DIAGNOSIS — R609 Edema, unspecified: Secondary | ICD-10-CM

## 2020-09-17 DIAGNOSIS — Z79899 Other long term (current) drug therapy: Secondary | ICD-10-CM

## 2020-09-17 NOTE — Telephone Encounter (Signed)
Patient is calling and requesting a medication refill for furosemide (LASIX) 40 MG tablet to be sent to  [facility] [address], [place], [place] [address]  Phone:  [phone] Fax:  [phone]  CB is [phone]

## 2020-09-18 ENCOUNTER — Telehealth: Payer: Self-pay | Admitting: Internal Medicine

## 2020-09-18 NOTE — Telephone Encounter (Signed)
I spoke with the patient and she stated that she is currently using Lasix 78m. She stated that she is not taking potassium when she takes her Lasix.

## 2020-09-18 NOTE — Telephone Encounter (Signed)
The patient said that need an Rx furosemide; she stated the wrong medication was sent in suppose to be  furosemide (LASIX) for 40  MG tablet   TAKE 1 TABLET(20 MG) BY MOUTH DAILY, Normal Dispense: 90 tablet Refills: 0 ordered

## 2020-09-18 NOTE — Telephone Encounter (Signed)
Contact patient  To confirm what she is actually taking . So we can refill the correct amount.   The 40 mg is not in the ehr  But we talked about increasing the dose at last visit.  Is she taking potassium  When she takes the lasix?

## 2020-09-20 NOTE — Telephone Encounter (Signed)
So  Ok to refill the lasix 20 mg per day disp 90  No refills  Plan bmp in when convenient to check potassium  If not done at cardiology   Dx edema  Medication managment  To see if we need to add  Potassium supplement

## 2020-09-21 ENCOUNTER — Ambulatory Visit: Payer: Medicare Other | Admitting: Cardiology

## 2020-09-21 MED ORDER — FUROSEMIDE 20 MG PO TABS: ORAL_TABLET | ORAL | 0 refills | Status: DC

## 2020-09-21 NOTE — Telephone Encounter (Signed)
Left a detailed message on the patients answering machine informing her that her prescription has been sent to her pharmacy and patient can come complete labs at her convenience.

## 2020-09-21 NOTE — Telephone Encounter (Signed)
RX has been sent and BMP labs have been

## 2020-09-21 NOTE — Addendum Note (Signed)
Addended by: Nilda Riggs on: 09/21/2020 08:22 AM   Modules accepted: Orders

## 2020-09-26 ENCOUNTER — Other Ambulatory Visit: Payer: Self-pay | Admitting: Internal Medicine

## 2020-09-26 NOTE — Telephone Encounter (Signed)
Please call her pharmacy and try to rebuild the story of what medicine was prescribed at what dose at what time by whom.  What she told us was different than what was on the record.

## 2020-09-28 ENCOUNTER — Telehealth: Payer: Self-pay | Admitting: Adult Health

## 2020-09-28 ENCOUNTER — Telehealth: Payer: Self-pay | Admitting: Internal Medicine

## 2020-09-28 DIAGNOSIS — G500 Trigeminal neuralgia: Secondary | ICD-10-CM

## 2020-09-28 NOTE — Telephone Encounter (Signed)
Pt called, for 2 or 3 weeks getting a lot of breakthrough pain. Would like to discuss dosage for lamoTRIgine (LAMICTAL) 100 MG tablet and Oxcarbazepine (TRILEPTAL) 300 MG tablet. Would like a call from the nurse.

## 2020-09-28 NOTE — Telephone Encounter (Signed)
Spoke to pt. She noted right neuralgia pain about a month ago has increasingly gotten worse.  Taking lamotrigine 175 mg twice a day and oxcarbamazepine 300 mg twice a day and she takes these at 8am and 8pm. These are random.  She they start later morning around 11-12 and then get more severe later afternoon.  Happening daily.    She uses [facility]. No triggers that she is aware aware of.  Has allergies to sulfa.  Does notice when she is eating more of an issue that side when she is chewing but that is it.  She has not been in touch with Dr. Melven Sartorius office.  From last ofv note we were going to touch base for any other recommendations from him that is pts perception too.

## 2020-09-28 NOTE — Telephone Encounter (Signed)
Pt called office today and spoke with nurse. See in other phone note from 09/28/20.

## 2020-09-28 NOTE — Telephone Encounter (Signed)
Pt states that she is taking furosemide (LASIX) 20 MG tablet. The patient stated that the wrong dosage was sent to her pharmacy. She said she is taking a furosemide (LASIX) 40 MG tablet.  she is requesting the correct dosage  of  40 mg  to be sent to her pharmacy

## 2020-09-28 NOTE — Telephone Encounter (Signed)
Sandy: I would like to send her to Dr. Arlan Organ at [facility] for another opinion. In the meantime we can increase her Lamictal to 219m twice daily. If this is ok with patient, please place orders or send back to me to place, thanks.

## 2020-09-28 NOTE — Telephone Encounter (Signed)
Dr. Jaynee Eagles- do you think she should reach out to Dr. Vertell Limber for treatment options. She has already tried several oral medications. Your thoughts?

## 2020-09-29 ENCOUNTER — Other Ambulatory Visit: Payer: Self-pay | Admitting: Neurology

## 2020-09-29 MED ORDER — LAMOTRIGINE 200 MG PO TABS: 200.0000 mg | ORAL_TABLET | Freq: Two times a day (BID) | ORAL | 3 refills | Status: DC

## 2020-09-29 NOTE — Telephone Encounter (Signed)
LMVM for pt to return call re: her TN.  Referral to Dr. Arlan Organ at [facility] and then also to increase lamotrigine to 214m po bid.

## 2020-09-29 NOTE — Telephone Encounter (Signed)
I spoke with Lonn Georgia at [facility] and she stated that the patient has only ever been prescribed Lasix 75m and it was prescribed by PCP. The date of first rx was May 17th, 2020.

## 2020-09-29 NOTE — Telephone Encounter (Signed)
Okay not sure what I should do about this but tell the patient the information you gave me.

## 2020-09-29 NOTE — Telephone Encounter (Signed)
There are newer messages   See if maddie can reach out to her to clarify dosing and rx

## 2020-09-29 NOTE — Telephone Encounter (Signed)
Pt returned phone call. Would like a call back.

## 2020-09-29 NOTE — Telephone Encounter (Signed)
Spoke to [name] and relayed the plan of referral to Dr. Lilyan Punt at [facility] and also for lamotrigine 253m po bid.  [name] needed new prescription she was glad to take 1 tablet bid.

## 2020-09-30 ENCOUNTER — Telehealth: Payer: Self-pay | Admitting: Neurology

## 2020-09-30 MED ORDER — FUROSEMIDE 40 MG PO TABS: 40.0000 mg | ORAL_TABLET | Freq: Every day | ORAL | 3 refills | Status: DC

## 2020-09-30 NOTE — Telephone Encounter (Signed)
Called patient to clarify dosing of furosemide. Patient has been taking 2 of the 20 mg tablets daily and would like a new prescription to reflect this change for the 40 mg tablets. Instructed her to continue taking 2 of the 20 mg tablets until the new prescription is sent in.  Scheduled lab visit for 10/01/20 for BMP.

## 2020-09-30 NOTE — Telephone Encounter (Signed)
Sent in  lastix 40  To pharmacy listed

## 2020-09-30 NOTE — Telephone Encounter (Signed)
The patient contacted the office and stated that her and PCP had a discussion during her last office visit in regards to increasing her dosage from 74m to 430m

## 2020-09-30 NOTE — Telephone Encounter (Signed)
See other threads    [name]  Answered

## 2020-09-30 NOTE — Telephone Encounter (Signed)
Faxed referral to [facility] for Dr. Lilyan Punt ph # [phone] & fax # [phone]. They review the referral and then will reach out to the patient to schedule.

## 2020-10-01 ENCOUNTER — Other Ambulatory Visit (INDEPENDENT_AMBULATORY_CARE_PROVIDER_SITE_OTHER): Payer: Medicare Other

## 2020-10-01 ENCOUNTER — Other Ambulatory Visit: Payer: Self-pay

## 2020-10-01 DIAGNOSIS — R413 Other amnesia: Secondary | ICD-10-CM

## 2020-10-01 DIAGNOSIS — Z8616 Personal history of COVID-19: Secondary | ICD-10-CM | POA: Diagnosis not present

## 2020-10-01 DIAGNOSIS — Z79899 Other long term (current) drug therapy: Secondary | ICD-10-CM

## 2020-10-01 DIAGNOSIS — R278 Other lack of coordination: Secondary | ICD-10-CM | POA: Diagnosis not present

## 2020-10-01 DIAGNOSIS — R2689 Other abnormalities of gait and mobility: Secondary | ICD-10-CM | POA: Diagnosis not present

## 2020-10-01 DIAGNOSIS — R609 Edema, unspecified: Secondary | ICD-10-CM | POA: Diagnosis not present

## 2020-10-01 LAB — URINALYSIS, ROUTINE W REFLEX MICROSCOPIC
Bilirubin Urine: NEGATIVE
Hgb urine dipstick: NEGATIVE
Ketones, ur: NEGATIVE
Leukocytes,Ua: NEGATIVE
Nitrite: NEGATIVE
Specific Gravity, Urine: 1.015 (ref 1.000–1.030)
Total Protein, Urine: NEGATIVE
Urine Glucose: NEGATIVE
Urobilinogen, UA: 0.2 (ref 0.0–1.0)
pH: 7 (ref 5.0–8.0)

## 2020-10-01 LAB — BASIC METABOLIC PANEL
BUN: 23 mg/dL (ref 6–23)
CO2: 34 mEq/L — ABNORMAL HIGH (ref 19–32)
Calcium: 9.5 mg/dL (ref 8.4–10.5)
Chloride: 102 mEq/L (ref 96–112)
Creatinine, Ser: 1.12 mg/dL (ref 0.40–1.20)
GFR: 46.21 mL/min — ABNORMAL LOW (ref >60.00)
Glucose, Bld: 108 mg/dL — ABNORMAL HIGH (ref 70–99)
Potassium: 4.5 mEq/L (ref 3.5–5.1)
Sodium: 141 mEq/L (ref 135–145)

## 2020-10-01 NOTE — Telephone Encounter (Signed)
Noted  

## 2020-10-02 LAB — URINE CULTURE
MICRO NUMBER:: 11882493
SPECIMEN QUALITY:: ADEQUATE

## 2020-10-05 ENCOUNTER — Encounter (HOSPITAL_COMMUNITY): Payer: Self-pay | Admitting: Emergency Medicine

## 2020-10-05 ENCOUNTER — Emergency Department (HOSPITAL_COMMUNITY)
Admission: EM | Admit: 2020-10-05 | Discharge: 2020-10-05 | Disposition: A | Discharge status: Home / Self Care | Payer: Medicare Other | Attending: Emergency Medicine | Admitting: Emergency Medicine

## 2020-10-05 ENCOUNTER — Emergency Department (HOSPITAL_COMMUNITY): Payer: Medicare Other

## 2020-10-05 ENCOUNTER — Other Ambulatory Visit: Payer: Self-pay

## 2020-10-05 DIAGNOSIS — N200 Calculus of kidney: Secondary | ICD-10-CM | POA: Diagnosis not present

## 2020-10-05 DIAGNOSIS — Z8616 Personal history of COVID-19: Secondary | ICD-10-CM | POA: Diagnosis not present

## 2020-10-05 DIAGNOSIS — I10 Essential (primary) hypertension: Secondary | ICD-10-CM | POA: Diagnosis not present

## 2020-10-05 DIAGNOSIS — Z20822 Contact with and (suspected) exposure to covid-19: Secondary | ICD-10-CM | POA: Insufficient documentation

## 2020-10-05 DIAGNOSIS — R1032 Left lower quadrant pain: Secondary | ICD-10-CM | POA: Diagnosis not present

## 2020-10-05 DIAGNOSIS — R319 Hematuria, unspecified: Secondary | ICD-10-CM | POA: Diagnosis not present

## 2020-10-05 DIAGNOSIS — N132 Hydronephrosis with renal and ureteral calculous obstruction: Secondary | ICD-10-CM | POA: Diagnosis not present

## 2020-10-05 DIAGNOSIS — N210 Calculus in bladder: Secondary | ICD-10-CM | POA: Diagnosis not present

## 2020-10-05 DIAGNOSIS — N3289 Other specified disorders of bladder: Secondary | ICD-10-CM | POA: Diagnosis not present

## 2020-10-05 DIAGNOSIS — Z79899 Other long term (current) drug therapy: Secondary | ICD-10-CM | POA: Diagnosis not present

## 2020-10-05 DIAGNOSIS — R1084 Generalized abdominal pain: Secondary | ICD-10-CM | POA: Diagnosis not present

## 2020-10-05 DIAGNOSIS — R110 Nausea: Secondary | ICD-10-CM | POA: Diagnosis not present

## 2020-10-05 DIAGNOSIS — N281 Cyst of kidney, acquired: Secondary | ICD-10-CM | POA: Diagnosis not present

## 2020-10-05 LAB — COMPREHENSIVE METABOLIC PANEL
ALT: 25 U/L (ref 0–44)
AST: 39 U/L (ref 15–41)
Albumin: 4.3 g/dL (ref 3.5–5.0)
Alkaline Phosphatase: 81 U/L (ref 38–126)
Anion gap: 7 (ref 5–15)
BUN: 22 mg/dL (ref 8–23)
CO2: 32 mmol/L (ref 22–32)
Calcium: 9.9 mg/dL (ref 8.9–10.3)
Chloride: 100 mmol/L (ref 98–111)
Creatinine, Ser: 1.11 mg/dL — ABNORMAL HIGH (ref 0.44–1.00)
GFR, Estimated: 50 mL/min — ABNORMAL LOW (ref >60)
Glucose, Bld: 132 mg/dL — ABNORMAL HIGH (ref 70–99)
Potassium: 4.8 mmol/L (ref 3.5–5.1)
Sodium: 139 mmol/L (ref 135–145)
Total Bilirubin: 0.6 mg/dL (ref 0.3–1.2)
Total Protein: 7 g/dL (ref 6.5–8.1)

## 2020-10-05 LAB — CBC WITH DIFFERENTIAL/PLATELET
Abs Immature Granulocytes: 0.03 10*3/uL (ref 0.00–0.07)
Basophils Absolute: 0.1 10*3/uL (ref 0.0–0.1)
Basophils Relative: 1 %
Eosinophils Absolute: 0.1 10*3/uL (ref 0.0–0.5)
Eosinophils Relative: 1 %
HCT: 47.7 % — ABNORMAL HIGH (ref 36.0–46.0)
Hemoglobin: 15.4 g/dL — ABNORMAL HIGH (ref 12.0–15.0)
Immature Granulocytes: 0 %
Lymphocytes Relative: 7 %
Lymphs Abs: 0.6 10*3/uL — ABNORMAL LOW (ref 0.7–4.0)
MCH: 28.7 pg (ref 26.0–34.0)
MCHC: 32.3 g/dL (ref 30.0–36.0)
MCV: 88.8 fL (ref 80.0–100.0)
Monocytes Absolute: 0.3 10*3/uL (ref 0.1–1.0)
Monocytes Relative: 4 %
Neutro Abs: 7.5 10*3/uL (ref 1.7–7.7)
Neutrophils Relative %: 87 %
Platelets: 203 10*3/uL (ref 150–400)
RBC: 5.37 MIL/uL — ABNORMAL HIGH (ref 3.87–5.11)
RDW: 14 % (ref 11.5–15.5)
WBC: 8.5 10*3/uL (ref 4.0–10.5)
nRBC: 0 % (ref 0.0–0.2)

## 2020-10-05 LAB — RESP PANEL BY RT-PCR (FLU A&B, COVID) ARPGX2
Influenza A by PCR: NEGATIVE
Influenza B by PCR: NEGATIVE
SARS Coronavirus 2 by RT PCR: NEGATIVE

## 2020-10-05 LAB — URINALYSIS, ROUTINE W REFLEX MICROSCOPIC
Bacteria, UA: NONE SEEN
Bilirubin Urine: NEGATIVE
Glucose, UA: NEGATIVE mg/dL
Ketones, ur: NEGATIVE mg/dL
Leukocytes,Ua: NEGATIVE
Nitrite: NEGATIVE
Protein, ur: NEGATIVE mg/dL
RBC / HPF: >50 RBC/hpf — ABNORMAL HIGH (ref 0–5)
Specific Gravity, Urine: 1.008 (ref 1.005–1.030)
pH: 8 (ref 5.0–8.0)

## 2020-10-05 LAB — LIPASE, BLOOD: Lipase: 30 U/L (ref 11–51)

## 2020-10-05 MED ORDER — ONDANSETRON HCL 4 MG/2ML IJ SOLN
4.0000 mg | Freq: Once | INTRAMUSCULAR | Status: AC
  Administered 2020-10-05: 4 mg via INTRAVENOUS
  Filled 2020-10-05: qty 2

## 2020-10-05 MED ORDER — OXCARBAZEPINE 300 MG PO TABS
300.0000 mg | ORAL_TABLET | Freq: Once | ORAL | Status: AC
  Administered 2020-10-05: 300 mg via ORAL
  Filled 2020-10-05: qty 1

## 2020-10-05 MED ORDER — LAMOTRIGINE 100 MG PO TABS
200.0000 mg | ORAL_TABLET | Freq: Once | ORAL | Status: AC
  Administered 2020-10-05: 200 mg via ORAL
  Filled 2020-10-05: qty 2

## 2020-10-05 MED ORDER — MORPHINE SULFATE (PF) 4 MG/ML IV SOLN
4.0000 mg | Freq: Once | INTRAVENOUS | Status: AC
Start: 2020-10-05 — End: 2020-10-05
  Administered 2020-10-05: 4 mg via INTRAVENOUS
  Filled 2020-10-05: qty 1

## 2020-10-05 NOTE — ED Provider Notes (Signed)
[facility] DEPT Provider Note   CSN: [id number] Arrival date & time: 10/05/20  1544     History Chief Complaint  Patient presents with  . Abdominal Pain  . Emesis    [name] is a 80 y.o. female.  80 year old female presents with acute onset left lower quadrant abdominal pain which began this morning.  Has had associated emesis.  No fever chills pain or urinary symptoms.  Denies any history of diverticular disease.  No blood in the stool.  Tried over-the-counter medication without relief.        Past Medical History:  Diagnosis Date  . Abdominal pain 05/29/2013   s/p rx of cephalo resistant e coli   but last rx NG  now residular ?  bladder sx repeat cx sx rx to ty and uro consult   . ADJ DISORDER WITH MIXED ANXIETY & DEPRESSED MOOD 03/03/2010   Qualifier: Diagnosis of  By: [name], [name]   . Agent resistant to multiple antibiotics 05/29/2013   e coli   bu NG on fu.    . ARF (acute renal failure) ([place]) 03/12/2015  . Closed head injury 02/01/2011   from syncope and had scalp laceration  neg ct .    Marland Kitchen Closed head injury 5-6 yrs ago  . Colitis 11/27/2017  . Complication of anesthesia    migraine several hours after general anesthesia  . Fatty liver   . Gall stones 2016   see ct scan neg HIDA   . GERD (gastroesophageal reflux disease)   . Hearing aid worn   . HOH (hard of hearing)    both ears  . Hyperlipidemia   . Hypertension    echo nl lv function  mild dilitation 2009  . Kidney infection    few yrs ago in hospital  . Medication side effect 09/02/2010   Poss muscle se of 10 crestor   . Migraine    hypnic HA eval by Dr. Earley Favor in the past  . Polycythemia   . 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 Pyelonephritis 03/12/2015  . 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 Syncope 02/01/2011   In shower on vacation  sustained head laceration  8 sutures Had ed visit neg head ct labs and x ray   .  Trigeminal neuralgia pain     Patient Active Problem List   Diagnosis Date Noted  . Lab test positive for detection of COVID-19 virus 05/25/2020  . Abdominal pain 05/16/2020  . Hematoma of abdominal wall, initial encounter 05/16/2020  . 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 Radiculopathy due to lumbar intervertebral disc disorder 03/27/2019  . 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 Diarrhea 11/27/2017  . Unilateral headache 05/26/2015  . Gallstones 03/12/2015  . Nausea without vomiting 12/10/2014  . Essential hypertension 05/29/2014  . 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 Low back pain radiating to right leg 01/16/2013  . Sinus problem 11/20/2012  . Chronic headaches morning 10/19/2012  . Low sodium levels  133 10/19/2012  . Leg pain, posterior 08/16/2012  . Sciatic neuritis 08/16/2012  . Sleep disturbance, unspecified 08/16/2012  . Medication management 03/24/2012  . Preventative health care 09/24/2011  . Trigeminal neuralgia 09/24/2011  . Postmenopausal HRT (hormone replacement therapy) 09/24/2011  . Polycythemia 09/20/2011  . Morning headache 02/01/2011  . Hearing aid worn   . Abnormal blood finding   . LOCALIZED  SUPERFICIAL SWELLING MASS OR LUMP 09/21/2009  . DYSPNEA 05/07/2008  . Hypnic headache 03/19/2008  . 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 Headache(784.0) 06/05/2007  . JAW PAIN 04/06/2007  . Osteoarthritis 04/06/2007  . ABNORMAL RESULT, FUNCTION STUDY, [place] 02/05/2007  . HYPERLIPIDEMIA 12/25/2006  . HYPERTENSION 12/25/2006    Past Surgical History:  Procedure Laterality Date  . ABDOMINAL HYSTERECTOMY  2002   tubal  . CARDIAC CATHETERIZATION  2000   chest pains neg  . CHOLECYSTECTOMY N/A 02/21/2017   Procedure: LAPAROSCOPIC CHOLECYSTECTOMY WITH INTRAOPERATIVE CHOLANGIOGRAM;  Surgeon: Armandina Gemma, MD;  Location: [facility];  Service: General;  Laterality: N/A;   . COLONOSCOPY     multiple  . CRANIOTOMY  12/09/2011   nerve decompression right trigeminal   . DOPPLER ECHOCARDIOGRAPHY  2009   nl lv function mild lv dilitation  . EYE SURGERY Bilateral    ioc for catatracts  . laparoscopic gallbladder surgery  02/16/2017   Fax from [facility]  . OOPHORECTOMY Bilateral 2002  . rt shoulder surgery       OB History    Gravida  2   Para  2   Term      Preterm      AB      Living        SAB      IAB      Ectopic      Multiple      Live Births              Family History  Problem Relation Age of Onset  . Ovarian cancer Mother   . Stroke Mother   . Alcohol abuse Father   . Stroke Father   . Diabetes Brother   . Cancer Paternal Aunt        leukemia, unknown type  . Seizures Daughter   . Hypertension Other   . Colon cancer Neg Hx   . Dementia Neg Hx   . Alzheimer's disease Neg Hx     Social History   Tobacco Use  . Smoking status: Never Smoker  . Smokeless tobacco: Never Used  Vaping Use  . Vaping Use: Never used  Substance Use Topics  . Alcohol use: Not Currently    Alcohol/week: 2.0 standard drinks    Types: 2 Glasses of wine per week    Comment: occ wine  . Drug use: No    Home Medications Prior to Admission medications   Medication Sig Start Date End Date Taking? Authorizing Provider  Acetaminophen (TYLENOL PO) Take 2 tablets by mouth as needed.    [provider]  alendronate (FOSAMAX) 70 MG tablet TAKE 1 TABLET BY MOUTH ONCE A WEEK, AS DIRECTED WITH FULL GLASS OF WATER ON EMPTY STOMACH 06/18/20   [name], [name], MD  benazepril (LOTENSIN) 20 MG tablet TAKE 1 TABLET(20 MG) BY MOUTH DAILY 08/18/20   [name], [name], MD  butalbital-acetaminophen-caffeine (FIORICET WITH CODEINE) [phone] MG capsule Take 1 capsule by mouth every 4 (four) hours as needed for headache. Rescue 04/22/20   [name], [name], MD  carvedilol (COREG) 25 MG tablet TAKE 1 TABLET(25 MG) BY MOUTH TWICE DAILY WITH  A MEAL 09/29/20   [name], [name], MD  conjugated estrogens (PREMARIN) vaginal cream 2 (two) times a week.    [provider]  fluticasone (FLONASE) 50 MCG/ACT nasal spray Place 2 sprays into both nostrils as needed for allergies or rhinitis. 02/11/20   [name], [name], MD  furosemide (  LASIX) 40 MG tablet Take 1 tablet (40 mg total) by mouth daily. 09/30/20   [name], [name], MD  lamoTRIgine (LAMICTAL) 200 MG tablet Take 1 tablet (200 mg total) by mouth 2 (two) times daily. 09/29/20   [name], MD  Multiple Vitamin (MULTIVITAMIN ADULT PO) Take 1 tablet by mouth.    [provider]  omeprazole (PRILOSEC) 20 MG capsule TAKE 1 CAPSULE(20 MG) BY MOUTH DAILY Patient taking differently: Take 20 mg by mouth 2 (two) times a week. 07/16/20   [name], [name], MD  ondansetron (ZOFRAN-ODT) 4 MG disintegrating tablet Take 1-2 tablets (4-8 mg total) by mouth every 8 (eight) hours as needed for nausea. 09/04/19   [name], MD  OVER THE COUNTER MEDICATION OTC eye drops    [provider]  Oxcarbazepine (TRILEPTAL) 300 MG tablet Take 300 mg by mouth 2 (two) times daily.    [provider]    Allergies    Elemental sulfur and Sulfamethoxazole-trimethoprim  Review of Systems   Review of Systems  All other systems reviewed and are negative.   Physical Exam Updated Vital Signs BP (!) 199/97   Pulse 60   Temp 97.6 F (36.4 C) (Oral)   Resp 16   SpO2 98%   Physical Exam Vitals and nursing note reviewed.  Constitutional:      General: She is not in acute distress.    Appearance: Normal appearance. She is well-developed. She is not toxic-appearing.  HENT:     Head: Normocephalic and atraumatic.  Eyes:     General: Lids are normal.     Conjunctiva/sclera: Conjunctivae normal.     Pupils: Pupils are equal, round, and reactive to light.  Neck:     Thyroid: No thyroid mass.     Trachea: No tracheal deviation.  Cardiovascular:     Rate and Rhythm: Normal  rate and regular rhythm.     Heart sounds: Normal heart sounds. No murmur heard. No gallop.   Pulmonary:     Effort: Pulmonary effort is normal. No respiratory distress.     Breath sounds: Normal breath sounds. No stridor. No decreased breath sounds, wheezing, rhonchi or rales.  Abdominal:     General: Bowel sounds are normal. There is no distension.     Palpations: Abdomen is soft.     Tenderness: There is abdominal tenderness in the left lower quadrant. There is guarding. There is no rebound.    Musculoskeletal:        General: No tenderness. Normal range of motion.     Cervical back: Normal range of motion and neck supple.  Skin:    General: Skin is warm and dry.     Findings: No abrasion or rash.  Neurological:     Mental Status: She is alert and oriented to person, place, and time.     GCS: GCS eye subscore is 4. GCS verbal subscore is 5. GCS motor subscore is 6.     Cranial Nerves: No cranial nerve deficit.     Sensory: No sensory deficit.  Psychiatric:        Speech: Speech normal.        Behavior: Behavior normal.     ED Results / Procedures / Treatments   Labs (all labs ordered are listed, but only abnormal results are displayed) Labs Reviewed  CBC WITH DIFFERENTIAL/PLATELET  COMPREHENSIVE METABOLIC PANEL  LIPASE, BLOOD  URINALYSIS, ROUTINE W REFLEX MICROSCOPIC    EKG None  Radiology No results  found.  Procedures Procedures   Medications Ordered in ED Medications  morphine 4 MG/ML injection 4 mg (4 mg Intravenous Given 10/05/20 1712)  ondansetron (ZOFRAN) injection 4 mg (4 mg Intravenous Given 10/05/20 1713)    ED Course  I have reviewed the triage vital signs and the nursing notes.  Pertinent labs & imaging results that were available during my care of the patient were reviewed by me and considered in my medical decision making (see chart for details).    MDM Rules/Calculators/A&P                          Patient abdominal CT consistent with past  kidney stone.  Urinalysis shows hematuria which confirmed this and no signs of infection.  She is pain-free at this time and will discharge home Final Clinical Impression(s) / ED Diagnoses Final diagnoses:  None    Rx / DC Orders ED Discharge Orders    None       Lacretia Leigh, MD 10/05/20 1955

## 2020-10-05 NOTE — ED Triage Notes (Signed)
Patient presents with left lower quadrant abdominial pain since this AM. She had one episode of emesis once today. 12 lead unremarkable   EMS vitals 200/108 BP 62 HR 22 Resp Rate 99% O2 sat on room air

## 2020-10-05 NOTE — ED Provider Notes (Signed)
Emergency Medicine Provider Triage Evaluation Note  Isabelly Kobler , a 80 y.o. female  was evaluated in triage.  Pt complains of abd pain.  Review of Systems  Positive: LLQ pain, nausea, vomiting Negative: Fever, cp, sob, dysuria  Physical Exam  BP (!) 194/92   Pulse 63   Temp 97.6 F (36.4 C) (Oral)   Resp 16   SpO2 98%  Gen:   Awake, hunched over, holding abdomen and appears uncomfortable Resp:  Normal effort  MSK:   Moves extremities without difficulty  Other:  LLQ tenderness with guarding  Medical Decision Making  Medically screening exam initiated at 4:10 PM.  Appropriate orders placed.  Jamyiah Labella was informed that the remainder of the evaluation will be completed by another provider, this initial triage assessment does not replace that evaluation, and the importance of remaining in the ED until their evaluation is complete.  LLQ tenderness started today with nausea and vomiting.  Will need CT scan and sxs management. Suspect diverticular disease   Doy Hutching 10/05/20 1612    Lacretia Leigh, MD 10/07/20 [phone]

## 2020-10-06 ENCOUNTER — Encounter: Payer: Self-pay | Admitting: Physician Assistant

## 2020-10-06 ENCOUNTER — Ambulatory Visit (INDEPENDENT_AMBULATORY_CARE_PROVIDER_SITE_OTHER): Payer: Medicare Other | Admitting: Physician Assistant

## 2020-10-06 VITALS — BP 104/52 | HR 62 | Ht 66.0 in | Wt 128.6 lb

## 2020-10-06 DIAGNOSIS — I10 Essential (primary) hypertension: Secondary | ICD-10-CM

## 2020-10-06 DIAGNOSIS — R600 Localized edema: Secondary | ICD-10-CM | POA: Diagnosis not present

## 2020-10-06 DIAGNOSIS — E785 Hyperlipidemia, unspecified: Secondary | ICD-10-CM | POA: Diagnosis not present

## 2020-10-06 NOTE — Patient Instructions (Signed)
Medication Instructions:  Continue current medications  *If you need a refill on your cardiac medications before your next appointment, please call your pharmacy*   Lab Work: None Ordered   Testing/Procedures: None Ordered   Follow-Up: At [facility], you and your health needs are our priority.  As part of our continuing mission to provide you with exceptional heart care, we have created designated Provider Care Teams.  These Care Teams include your primary Cardiologist (physician) and Advanced Practice Providers (APPs -  Physician Assistants and Nurse Practitioners) who all work together to provide you with the care you need, when you need it.  We recommend signing up for the patient portal called "MyChart".  Sign up information is provided on this After Visit Summary.  MyChart is used to connect with patients for Virtual Visits (Telemedicine).  Patients are able to view lab/test results, encounter notes, upcoming appointments, etc.  Non-urgent messages can be sent to your provider as well.   To learn more about what you can do with MyChart, go to [email or web address].    Your next appointment:   6 month(s)  The format for your next appointment:   In Person  Provider:   You may see [name], MD or one of the following Advanced Practice Providers on your designated Care Team:    Rosaria Ferries, PA-C  Jory Sims, DNP, ANP

## 2020-10-06 NOTE — Progress Notes (Signed)
Cardiology Office Note:    Date:  10/08/2020   ID:  [name], DOB 04-04-1941, MRN [id number]  PCP:  [name], MD   [place] Prov[facility] Cardiologist:  [name], MD {  Referring MD: [name], MD   Chief Complaint  Patient presents with  . Follow-up    Seen for Dr. Harrell Gave    History of Present Illness:    [name] is a 80 y.o. female with a hx of hypertension, hyperlipidemia, trigeminal neuralgia and lower extremity edema.  Patient was initially seen in July 2021 for evaluation of labile high blood pressure.  Blood pressure at home ranges between 100-170s midday after taking her breakfast.  She was on carvedilol 25 mg twice a day, Lasix in the morning, benazepril 20 mg at night and clonidine 0.1 mg twice a day.  Due to reported intermittent hypotension, her blood pressure medication was kept the same.  Previous echocardiogram obtained on 12/18/2019 showed EF 55 to 60%, grade 2 DD, RVSP 31.2 mmHg, trivial MR.  Patient presents today for follow-up.  She does have 1+ pitting edema in the dorsal aspect of her bilateral feet, however no significant edema in her leg.  She is wearing compression stocking.  She denies any recent chest pain or worsening dyspnea.  Her lungs is clear.  At this time I do not recommend increasing her diuretic.  I recommend low-salt diet, fluid restriction between 33 to 64 ounces, and leg elevation for conservative management.  She is on 40 mg daily of Lasix.  Blood pressure has been well controlled even after her clonidine was discontinued.  She can follow-up in 6 months.   Past Medical History:  Diagnosis Date  . Abdominal pain 05/29/2013   s/p rx of cephalo resistant e coli   but last rx NG  now residular ?  bladder sx repeat cx sx rx to ty and uro consult   . ADJ DISORDER WITH MIXED ANXIETY & DEPRESSED MOOD 03/03/2010   Qualifier: Diagnosis of  By: [name], [name]   . Agent resistant to multiple  antibiotics 05/29/2013   e coli   bu NG on fu.    . ARF (acute renal failure) ([place]) 03/12/2015  . Closed head injury 02/01/2011   from syncope and had scalp laceration  neg ct .    Marland Kitchen Closed head injury 5-6 yrs ago  . Colitis 11/27/2017  . Complication of anesthesia    migraine several hours after general anesthesia  . Fatty liver   . Gall stones 2016   see ct scan neg HIDA   . GERD (gastroesophageal reflux disease)   . Hearing aid worn   . HOH (hard of hearing)    both ears  . Hyperlipidemia   . Hypertension    echo nl lv function  mild dilitation 2009  . Kidney infection    few yrs ago in hospital  . Medication side effect 09/02/2010   Poss muscle se of 10 crestor   . Migraine    hypnic HA eval by Dr. Earley Favor in the past  . Polycythemia   . 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 Pyelonephritis 03/12/2015  . 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 Syncope 02/01/2011   In shower on vacation  sustained head laceration  8 sutures Had ed visit neg head ct labs and x ray   . Trigeminal neuralgia pain     Past  Surgical History:  Procedure Laterality Date  . ABDOMINAL HYSTERECTOMY  2002   tubal  . CARDIAC CATHETERIZATION  2000   chest pains neg  . CHOLECYSTECTOMY N/A 02/21/2017   Procedure: LAPAROSCOPIC CHOLECYSTECTOMY WITH INTRAOPERATIVE CHOLANGIOGRAM;  Surgeon: Armandina Gemma, MD;  Location: [facility];  Service: General;  Laterality: N/A;  . COLONOSCOPY     multiple  . CRANIOTOMY  12/09/2011   nerve decompression right trigeminal   . DOPPLER ECHOCARDIOGRAPHY  2009   nl lv function mild lv dilitation  . EYE SURGERY Bilateral    ioc for catatracts  . laparoscopic gallbladder surgery  02/16/2017   Fax from [facility]  . OOPHORECTOMY Bilateral 2002  . rt shoulder surgery      Current Medications: Current Meds  Medication Sig  . Acetaminophen (TYLENOL PO) Take 2 tablets by mouth as needed.  Marland Kitchen alendronate (FOSAMAX) 70 MG tablet TAKE 1 TABLET  BY MOUTH ONCE A WEEK, AS DIRECTED WITH FULL GLASS OF WATER ON EMPTY STOMACH  . benazepril (LOTENSIN) 20 MG tablet TAKE 1 TABLET(20 MG) BY MOUTH DAILY  . butalbital-acetaminophen-caffeine (FIORICET WITH CODEINE) 50-325-40-30 MG capsule Take 1 capsule by mouth every 4 (four) hours as needed for headache. Rescue  . carvedilol (COREG) 25 MG tablet TAKE 1 TABLET(25 MG) BY MOUTH TWICE DAILY WITH A MEAL  . conjugated estrogens (PREMARIN) vaginal cream 2 (two) times a week.  . fluticasone (FLONASE) 50 MCG/ACT nasal spray Place 2 sprays into both nostrils as needed for allergies or rhinitis.  . furosemide (LASIX) 40 MG tablet Take 1 tablet (40 mg total) by mouth daily.  Marland Kitchen lamoTRIgine (LAMICTAL) 200 MG tablet Take 1 tablet (200 mg total) by mouth 2 (two) times daily.  . Multiple Vitamin (MULTIVITAMIN ADULT PO) Take 1 tablet by mouth.  Marland Kitchen omeprazole (PRILOSEC) 20 MG capsule TAKE 1 CAPSULE(20 MG) BY MOUTH DAILY (Patient taking differently: Take 20 mg by mouth 2 (two) times a week.)  . ondansetron (ZOFRAN-ODT) 4 MG disintegrating tablet Take 1-2 tablets (4-8 mg total) by mouth every 8 (eight) hours as needed for nausea.  Marland Kitchen OVER THE COUNTER MEDICATION OTC eye drops  . Oxcarbazepine (TRILEPTAL) 300 MG tablet Take 300 mg by mouth 2 (two) times daily.     Allergies:   Elemental sulfur and Sulfamethoxazole-trimethoprim   Social History   Socioeconomic History  . Marital status: Married    Spouse name: Not on file  . Number of children: 2  . Years of education: Not on file  . Highest education level: Not on file  Occupational History    Comment: retired Forensic psychologist  Tobacco Use  . Smoking status: Never Smoker  . Smokeless tobacco: Never Used  Vaping Use  . Vaping Use: Never used  Substance and Sexual Activity  . Alcohol use: Not Currently    Alcohol/week: 2.0 standard drinks    Types: 2 Glasses of wine per week    Comment: occ wine  . Drug use: No  . Sexual activity: Not on file  Other  Topics Concern  . Not on file  Social History Narrative   Married   HH of 2-3 (god daughter)   Pets 2 dogs   Non smoker    Child is a physician   G2P2      Caffeine: 2 cups/day   Social Determinants of Health   Financial Resource Strain: Low Risk   . Difficulty of Paying Living Expenses: Not hard at all  Food Insecurity: No Food Insecurity  .  Worried About Charity fundraiser in the Last Year: Never true  . Ran Out of Food in the Last Year: Never true  Transportation Needs: No Transportation Needs  . Lack of Transportation (Medical): No  . Lack of Transportation (Non-Medical): No  Physical Activity: Inactive  . Days of Exercise per Week: 0 days  . Minutes of Exercise per Session: 0 min  Stress: No Stress Concern Present  . Feeling of Stress : Not at all  Social Connections: Moderately Integrated  . Frequency of Communication with Friends and Family: More than three times a week  . Frequency of Social Gatherings with Friends and Family: Once a week  . Attends Religious Services: 1 to 4 times per year  . Active Member of Clubs or Organizations: No  . Attends Archivist Meetings: Never  . Marital Status: Married     Family History: The patient's family history includes Alcohol abuse in her father; Cancer in her paternal aunt; Diabetes in her brother; Hypertension in an other family member; Ovarian cancer in her mother; Seizures in her daughter; Stroke in her father and mother. There is no history of Colon cancer, Dementia, or Alzheimer's disease.  ROS:   Please see the history of present illness.     All other systems reviewed and are negative.  EKGs/Labs/Other Studies Reviewed:    The following studies were reviewed today:  Echo 12/18/2019 1. Left ventricular ejection fraction, by estimation, is 55 to 60%. The  left ventricle has normal function. The left ventricle has no regional  wall motion abnormalities. There is moderate asymmetric left ventricular   hypertrophy of the basal-septal  segment. Left ventricular diastolic parameters are consistent with Grade  II diastolic dysfunction (pseudonormalization). Elevated left atrial  pressure.  2. Right ventricular systolic function is normal. The right ventricular  size is normal. There is normal pulmonary artery systolic pressure. The  estimated right ventricular systolic pressure is 46.5 mmHg.  3. The mitral valve is normal in structure. Trivial mitral valve  regurgitation.  4. The aortic valve is tricuspid. Aortic valve regurgitation is not  visualized. No aortic stenosis is present.  5. The inferior vena cava is normal in size with <50% respiratory  variability, suggesting right atrial pressure of 8 mmHg.  EKG:  EKG is not ordered today.    Recent Labs: 07/01/2020: TSH 0.88 10/05/2020: ALT 25; BUN 22; Creatinine, Ser 1.11; Hemoglobin 15.4; Platelets 203; Potassium 4.8; Sodium 139  Recent Lipid Panel    Component Value Date/Time   CHOL 279 (H) 05/27/2019 1045   TRIG 78.0 05/27/2019 1045   TRIG 192 (H) 03/17/2006 0820   HDL 118.80 05/27/2019 1045   CHOLHDL 2 05/27/2019 1045   VLDL 15.6 05/27/2019 1045   LDLCALC 145 (H) 05/27/2019 1045   LDLDIRECT 135.9 10/16/2012 0840     Risk Assessment/Calculations:       Physical Exam:    VS:  BP (!) 104/52   Pulse 62   Ht 5' 6"  (1.676 m)   Wt 128 lb 9.6 oz (58.3 kg)   SpO2 91%   BMI 20.76 kg/m     Wt Readings from Last 3 Encounters:  10/06/20 128 lb 9.6 oz (58.3 kg)  08/17/20 127 lb 3.2 oz (57.7 kg)  07/20/20 126 lb 9.6 oz (57.4 kg)     GEN:  Well nourished, well developed in no acute distress HEENT: Normal NECK: No JVD; No carotid bruits LYMPHATICS: No lymphadenopathy CARDIAC: RRR, no murmurs, rubs, gallops RESPIRATORY:  Clear to auscultation without rales, wheezing or rhonchi  ABDOMEN: Soft, non-tender, non-distended MUSCULOSKELETAL:  No edema; No deformity  SKIN: Warm and dry NEUROLOGIC:  Alert and oriented x  3 PSYCHIATRIC:  Normal affect   ASSESSMENT:    1. Pedal edema   2. Primary hypertension   3. Hyperlipidemia LDL goal <100    PLAN:    In order of problems listed above:  1. Bilateral pedal edema: We discussed salt restriction, fluid restriction and leg elevation as conservative management.  At this time I do not recommend increase Lasix dosage.  2. Hypertension: Since she came off of clonidine, blood pressure remained stable.  We will continue on the current therapy  3. Hyperlipidemia: Not on any statin.  LDL goal less than 100.  Last lipid panel obtained in January 2021 showed LDL 145.  She is due for repeat blood work.        Medication Adjustments/Labs and Tests Ordered: Current medicines are reviewed at length with the patient today.  Concerns regarding medicines are outlined above.  No orders of the defined types were placed in this encounter.  No orders of the defined types were placed in this encounter.   Patient Instructions  Medication Instructions:  Continue current medications  *If you need a refill on your cardiac medications before your next appointment, please call your pharmacy*   Lab Work: None Ordered   Testing/Procedures: None Ordered   Follow-Up: At [facility], you and your health needs are our priority.  As part of our continuing mission to provide you with exceptional heart care, we have created designated Provider Care Teams.  These Care Teams include your primary Cardiologist (physician) and Advanced Practice Providers (APPs -  Physician Assistants and Nurse Practitioners) who all work together to provide you with the care you need, when you need it.  We recommend signing up for the patient portal called "MyChart".  Sign up information is provided on this After Visit Summary.  MyChart is used to connect with patients for Virtual Visits (Telemedicine).  Patients are able to view lab/test results, encounter notes, upcoming appointments, etc.   Non-urgent messages can be sent to your provider as well.   To learn more about what you can do with MyChart, go to [email or web address].    Your next appointment:   6 month(s)  The format for your next appointment:   In Person  Provider:   You may see [name], MD or one of the following Advanced Practice Providers on your designated Care Team:    Rosaria Ferries, PA-C  Jory Sims, DNP, ANP         Signed, Almyra Deforest, [place]  10/08/2020 10:25 PM    [place]

## 2020-10-08 ENCOUNTER — Encounter: Payer: Self-pay | Admitting: Physician Assistant

## 2020-10-08 DIAGNOSIS — H02831 Dermatochalasis of right upper eyelid: Secondary | ICD-10-CM | POA: Diagnosis not present

## 2020-10-08 DIAGNOSIS — H02834 Dermatochalasis of left upper eyelid: Secondary | ICD-10-CM | POA: Diagnosis not present

## 2020-10-08 DIAGNOSIS — H02413 Mechanical ptosis of bilateral eyelids: Secondary | ICD-10-CM | POA: Diagnosis not present

## 2020-10-08 DIAGNOSIS — H02423 Myogenic ptosis of bilateral eyelids: Secondary | ICD-10-CM | POA: Diagnosis not present

## 2020-10-14 NOTE — Progress Notes (Signed)
[facility]    Provider:  Dr Jaynee Eagles Requesting Provider: Erline Levine, MD Primary Care Provider:  [name], MD  CC: trigeminal neuralgia  Virtual Visit via Telephone Note  I connected with [name] on 10/15/20 at  1:30 PM EDT by telephone and verified that I am speaking with the correct person using two identifiers.  Location: Patient: home Provider: office   I discussed the limitations, risks, security and privacy concerns of performing an evaluation and management service by telephone and the availability of in person appointments. I also discussed with the patient that there may be a patient responsible charge related to this service. The patient expressed understanding and agreed to proceed.  Follow Up Instructions:    I discussed the assessment and treatment plan with the patient. The patient was provided an opportunity to ask questions and all were answered. The patient agreed with the plan and demonstrated an understanding of the instructions.   The patient was advised to call back or seek an in-person evaluation if the symptoms worsen or if the condition fails to improve as anticipated.  I provided 25 minutes of non-face-to-face time during this encounter.   Angela Beam, MD    Interval history 07/08/2020:  Referral to Dr. Arlan Organ at [facility] and then also to increase lamotrigine to 258m po bid. She has breakthrough pain. It gets worse around 8pm. Just before that 8pm deadline it gets bad and when she eats it breaks through. She has an appointment with Dr. lArlan Organon 6/8. She has to bring all imaging. She had vascular decompression in 2013, lasted 9 years, may need revision?   Patient is here on concern of memory changes. She says in the last month she has gotten much better as far as the sciatica goes. She can walk around te house. She uses a cProgrammer, multimediaaround the house. She did therapy last spring, and so she is trying to strengthen  the muscles in her legs. 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 She is very lucid right now but in the morning she gets unstable and is confused. She can almost fall after she takes her medicine. She feels fantastic now on the oxcarb and lamictal.    MRI brain: No evidence of recent infarction, hemorrhage, or mass. Stable postoperative changes of prior trigeminal nerve decompression.Stable mild chronic microvascular ischemic changes' . MRI cervical spine:  No abnormal cord signal. Multilevel degenerative changes as detailed above. No high-grade canal stenosis. Foraminal narrowing is greatest on the left at C5-C6.  TSH 0.88, B12 933,   Patient complains of symptoms per HPI as well as the following symptoms: imbalance, falls . Pertinent negatives and positives per HPI. All others negative   Interval history 05/11/2020: She is taking more Lamictal. She had the procedure with Dr. SVertell Limber her right face was numb for some time, there ois discomfort now, once in a while she gets the lightning strike, the procedure made a huge difference. She is on 1039mtwice a day of the Lamictal.   HPI:  SaSelby Slovaceks a 89.o. female here as requested by [name], WaStandley BrookingMD for Trigeminal neuralgia. 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 Mart Piggs in the past, remote kidney infection, hypertension, hyperlipidemia, hard of hearing, fatty liver, anxiety and depression.  I reviewed Dr. Melven Sartorius notes, she was seen there for evaluation of bilateral leg pain, increased leg pain left greater than right 6 months, weakness  in both legs, she had epidural steroid injections x3, most recently left L4-L5 in November with no relief, numerous medications trialed most recently Cymbalta, Tylenol, Motrin.  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 Reviewed Dr. [name] notes, patient sodium was affected on Tegretol.   She had surgery at [facility] in 2013 for trigeminal neuralgia, she was told hopefully the surgery would help but since then she had been increasing the medication and increased to 1235m a day and was decreased due to blood work and side effects, and now she has stopped the carbamazepine. She was getting side effects from the carbamazepine but it was helping. Dr. SVertell Limberplaced her on Oxcarbazepine(trileptal) and now she is on cymbalta, she started the Trileptal a few weeks ago. She was started on 3071mand just a few days ago she was increased to 60064mwice a day(1200m36mday). She is not taking the cymbalta, was likely prescribed for low back pain (Dr. NewtErnestina Patcheshe ain starts on the right and shoots down her jaw, chewing, talking makes it worse, severe, shooting, same pain she has always experienced. The pain is severe today. She says she is unstable and tired, gets better after 45 minutes. When she did the clock test she could not draw the clock. No coordination. All the numbers were  in sequence. She could not draw.   Review of Systems: Patient complains of symptoms per HPI as well as the following symptoms: facial pain . Pertinent negatives and positives per HPI. All others negative    Social History   Socioeconomic History  . Marital status: Married    Spouse name: Not on file  . Number of children: 2  . Years of education: Not on file  . Highest education level: Not on file  Occupational History    Comment: retired realForensic psychologistbacco Use  . Smoking status: Never Smoker  . Smokeless tobacco: Never Used  Vaping Use  . Vaping Use: Never used  Substance and Sexual Activity  . Alcohol use: Not Currently    Alcohol/week: 2.0 standard drinks    Types: 2 Glasses of wine per week    Comment: occ wine  . Drug use: No  . Sexual activity: Not on file  Other Topics Concern  . Not on file  Social History Narrative   Married   HH of 2-3 (god daughter)   Pets 2 dogs   Non smoker    Child is a physician   G2P2      Caffeine: 2 cups/day   Social Determinants of Health   Financial Resource Strain: Low Risk   .  Difficulty of Paying Living Expenses: Not hard at all  Food Insecurity: No Food Insecurity  . Worried About Charity fundraiser in the Last Year: Never true  . Ran Out of Food in the Last Year: Never true  Transportation Needs: No Transportation Needs  . Lack of Transportation (Medical): No  . Lack of Transportation (Non-Medical): No  Physical Activity: Inactive  . Days of Exercise per Week: 0 days  . Minutes of Exercise per Session: 0 min  Stress: No Stress Concern Present  . Feeling of Stress : Not at all  Social Connections: Moderately Integrated  . Frequency of Communication with Friends and Family: More than three times a week  . Frequency of Social Gatherings with Friends and Family: Once a week  . Attends Religious Services: 1 to 4 times per year  . Active Member of Clubs or Organizations: No  . Attends Archivist  Meetings: Never  . Marital Status: Married  Human resources officer Violence: Not At Risk  . Fear of Current or Ex-Partner: No  . Emotionally Abused: No  . Physically Abused: No  . Sexually Abused: No    Family History  Problem Relation Age of Onset  . Ovarian cancer Mother   . Stroke Mother   . Alcohol abuse Father   . Stroke Father   . Diabetes Brother   . Cancer Paternal Aunt        leukemia, unknown type  . Seizures Daughter   . Hypertension Other   . Colon cancer Neg Hx   . Dementia Neg Hx   . Alzheimer's disease Neg Hx     Past Medical History:  Diagnosis Date  . Abdominal pain 05/29/2013   s/p rx of cephalo resistant e coli   but last rx NG  now residular ?  bladder sx repeat cx sx rx to ty and uro consult   . ADJ DISORDER WITH MIXED ANXIETY & DEPRESSED MOOD 03/03/2010   Qualifier: Diagnosis of  By: [name], [name]   . Agent resistant to multiple antibiotics 05/29/2013   e coli   bu NG on fu.    . ARF (acute renal failure) ([place]) 03/12/2015  . Closed head injury 02/01/2011   from syncope and had scalp laceration  neg ct .    Marland Kitchen Closed head injury 5-6 yrs ago  . Colitis 11/27/2017  . Complication of anesthesia    migraine several hours after general anesthesia  . Fatty liver   . Gall stones 2016   see ct scan neg HIDA   . GERD (gastroesophageal reflux disease)   . Hearing aid worn   . HOH (hard of hearing)    both ears  . Hyperlipidemia   . Hypertension    echo nl lv function  mild dilitation 2009  . Kidney infection    few yrs ago in hospital  . Medication side effect 09/02/2010   Poss muscle se of 10 crestor   . Migraine    hypnic HA eval by Dr. Earley Favor in the past  . Polycythemia   . 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 Pyelonephritis 03/12/2015  . 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 Syncope 02/01/2011   In shower on vacation  sustained head laceration  8 sutures Had ed visit neg head ct labs and x ray   . Trigeminal neuralgia pain      Patient Active Problem List   Diagnosis Date Noted  .  Lab test positive for detection of COVID-19 virus 05/25/2020  . Abdominal pain 05/16/2020  . Hematoma of abdominal wall, initial encounter 05/16/2020  . 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 Radiculopathy due to lumbar intervertebral disc disorder 03/27/2019  . 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 Diarrhea 11/27/2017  . Unilateral headache 05/26/2015  . Gallstones 03/12/2015  . Nausea without vomiting 12/10/2014  . Essential hypertension 05/29/2014  . 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 Low back pain radiating to right leg 01/16/2013  . Sinus problem 11/20/2012  . Chronic headaches morning 10/19/2012  . Low sodium levels  133 10/19/2012  . Leg pain, posterior 08/16/2012  . Sciatic neuritis 08/16/2012  . Sleep disturbance, unspecified 08/16/2012  . Medication management 03/24/2012  . Preventative health care 09/24/2011  . Trigeminal neuralgia 09/24/2011  . Postmenopausal HRT (hormone replacement therapy) 09/24/2011  . Polycythemia 09/20/2011  . Morning headache 02/01/2011  . Hearing aid worn   . Abnormal blood finding   . LOCALIZED SUPERFICIAL SWELLING MASS OR LUMP 09/21/2009  . DYSPNEA 05/07/2008  . Hypnic headache 03/19/2008  . 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 Headache(784.0) 06/05/2007  . JAW PAIN 04/06/2007  . Osteoarthritis 04/06/2007  . ABNORMAL RESULT, FUNCTION STUDY, [place] 02/05/2007  . HYPERLIPIDEMIA 12/25/2006  . HYPERTENSION 12/25/2006    Past Surgical History:  Procedure Laterality Date  . ABDOMINAL HYSTERECTOMY  2002   tubal  . CARDIAC CATHETERIZATION  2000   chest pains neg  . CHOLECYSTECTOMY N/A 02/21/2017   Procedure: LAPAROSCOPIC CHOLECYSTECTOMY WITH INTRAOPERATIVE CHOLANGIOGRAM;  Surgeon: Armandina Gemma, MD;  Location: [facility];  Service: General;  Laterality: N/A;  . COLONOSCOPY      multiple  . CRANIOTOMY  12/09/2011   nerve decompression right trigeminal   . DOPPLER ECHOCARDIOGRAPHY  2009   nl lv function mild lv dilitation  . EYE SURGERY Bilateral    ioc for catatracts  . laparoscopic gallbladder surgery  02/16/2017   Fax from [facility]  . OOPHORECTOMY Bilateral 2002  . rt shoulder surgery      Current Outpatient Medications  Medication Sig Dispense Refill  . Acetaminophen (TYLENOL PO) Take 2 tablets by mouth as needed.    Marland Kitchen alendronate (FOSAMAX) 70 MG tablet TAKE 1 TABLET BY MOUTH ONCE A WEEK, AS DIRECTED WITH FULL GLASS OF WATER ON EMPTY STOMACH 4 tablet 3  . benazepril (LOTENSIN) 20 MG tablet TAKE 1 TABLET(20 MG) BY MOUTH DAILY 90 tablet 0  . butalbital-acetaminophen-caffeine (FIORICET WITH CODEINE) 50-325-40-30 MG capsule Take 1 capsule by mouth every 4 (four) hours as needed for headache. Rescue 30 capsule 0  . carvedilol (COREG) 25 MG tablet TAKE 1 TABLET(25 MG) BY MOUTH TWICE DAILY WITH A MEAL 60 tablet 3  . conjugated estrogens (PREMARIN) vaginal cream 2 (two) times a week.    . fluticasone (FLONASE) 50 MCG/ACT nasal spray Place 2 sprays into both nostrils as needed for allergies or rhinitis. 16 g 5  . furosemide (LASIX) 40 MG tablet Take 1 tablet (40 mg total) by mouth daily. 30 tablet 3  . lamoTRIgine (LAMICTAL) 200 MG tablet Take 1 tablet (200 mg total) by mouth 2 (two) times daily. 180 tablet 3  . Multiple Vitamin (MULTIVITAMIN ADULT PO) Take 1 tablet by mouth.    Marland Kitchen omeprazole (PRILOSEC) 20 MG capsule TAKE 1 CAPSULE(20 MG) BY MOUTH DAILY (Patient taking differently: Take 20 mg by mouth 2 (two) times a week.) 30 capsule 0  . ondansetron (  ZOFRAN-ODT) 4 MG disintegrating tablet Take 1-2 tablets (4-8 mg total) by mouth every 8 (eight) hours as needed for nausea. 60 tablet 3  . OVER THE COUNTER MEDICATION OTC eye drops    . Oxcarbazepine (TRILEPTAL) 300 MG tablet Take 300 mg by mouth 2 (two) times daily.     No current facility-administered  medications for this visit.    Allergies as of 10/15/2020 - Review Complete 10/08/2020  Allergen Reaction Noted  . Elemental sulfur Hives and Itching 07/08/2019  . Sulfamethoxazole-trimethoprim Other (See Comments)     Vitals: There were no vitals taken for this visit. Last Weight:  Wt Readings from Last 1 Encounters:  10/06/20 128 lb 9.6 oz (58.3 kg)   Last Height:   Ht Readings from Last 1 Encounters:  10/06/20 5' 6"  (1.676 m)    Exam: NAD, pleasant                  Speech:    Speech is normal; fluent and spontaneous with normal comprehension.  Cognition: MMSE - Mini Mental State Exam 07/08/2020  Orientation to time 5  Orientation to Place 5  Registration 3  Attention/ Calculation 2  Recall 3  Language- name 2 objects 2  Language- repeat 1  Language- follow 3 step command 3  Language- read & follow direction 1  Write a sentence 1  Copy design 0  Total score 26       The patient is oriented to person, place, and time;   Cranial Nerves:    The pupils are equal, round, and reactive to light.Trigeminal sensation is intact and the muscles of mastication are normal. The face is symmetric. The palate elevates in the midline. Hearing intact. Voice is normal. Shoulder shrug is normal. The tongue has normal motion without fasciculations.   Coordination:  No dysmetria, no tremor  Motor Observation:    No asymmetry, no atrophy, and no involuntary movements noted. Tone:    Normal muscle tone.     Strength:    Strength is symmetrical in the upper and lower limbs, no focal deficits noted     Sensation: intact to LT      Assessment/Plan:  Lovely 80year old femle with severe pain. She has suggered with trigeminal neuralgia ([name]) since 2008, in 2013 had microvascular decompression at [facility] and did great for 9 years with recurrent [name](may need revision?). Very difficult case, patient had hyponatremia with Tegretol, already on Trileptal and need to watch for hypona Adding  another anti-epilepsy medication or baclofen is a possibility but I do worry about side effects of having multiple AEDs or baclofen in an 80 year old. tried nerve blocks today. I'ver tried botox with other patients in the past with poor results, and risk of affecting CN 7,Had gamma knife within the last year. Other options include gamma knife, balloon, rhizotomy or others. Discussed at length with patient. I do not think she needs repeat imaging, symptoms had not changed, no new symptoms or red flags.But Dr. Arlan Organ may want to get an MRI with trigeminal protocol to see if the remote microvascular decompression(mayo 2013) is still viable or maybe revision is needed?  Referral to Dr. Arlan Organ at [facility]. Dr. Arlan Organ may want to get an MRI with trigeminal protocol to see if the remote microvascular decompression(mayo 2013) is still viable or maybe revision is needed? Increase lamotrigine to 268m po bid. She has breakthrough pain. It gets worse around 8pm. Just before that 8pm deadline it gets bad and when  she eats it breaks through. She has an appointment with Dr. Arlan Organ on 6/8. She has to bring all imaging. She had vascular decompression in 2013, lasted 9 years, may need revision? Also had some gamma knife in the past.   Calling her pharmacy to ensure her lamictal prescription is accurate she said she got too many  And call [facility] to ensure she can pick up diskcs of recent and remote MRIs of the brain and MRI cervical spine.   Doing much better on lamictal and trileptal. Watch for hyponatremia. Check labs She is imbalanced in the mornings after taking her meds(5 pills). Recommended spacing out the pills to see if one is the culprit or if she is just taking too many at once. She will send me an email and let me know what happens Some confusion, unable to draw clock, husband and pcp confirmed for her memory, today mmse 26/30, will send for formal neurocognitive testing. MRI brain and cervical spine  unremarkable.   No orders of the defined types were placed in this encounter.  No orders of the defined types were placed in this encounter.   PRIOR: Hyponatremia with Tegretol, on 1281m daily Trileptal, other medications with evidence include: baclofen,Gabapentin, lamictal, lyrica, baclofen, botox   No orders of the defined types were placed in this encounter.   Cc: [name], WStandley Brooking MD, JErline Levine MD  ASarina Ill MD  [facility] [address] [address] Phone [phone] [phone]

## 2020-10-15 ENCOUNTER — Telehealth: Payer: Self-pay | Admitting: Neurology

## 2020-10-15 ENCOUNTER — Telehealth (INDEPENDENT_AMBULATORY_CARE_PROVIDER_SITE_OTHER): Payer: Medicare Other | Admitting: Neurology

## 2020-10-15 DIAGNOSIS — G500 Trigeminal neuralgia: Secondary | ICD-10-CM

## 2020-10-15 NOTE — Telephone Encounter (Signed)
I called the patient twice since 5 PM and left voicemail.  I do not see where he can leave any details so I have asked her to give [place] a call back.  When the patient calls back, please let her know that Dr. Jaynee Eagles called [facility].  They said they gave the patient 200 mg strength lamotrigine (Lamictal) pills so she should take 1 pill in the morning and 1 pill in the evening.  If she is concerned, she can bring the prescription to [place] when the office is open to let us look at it.  Dr. Jaynee Eagles had given her a 4-monthsupply so that may be why the patient feels she has so many.  Please also let her know that [facility] the CDs with all of her past MRIs on them [facility]  [facility] need for [facility] and do not need any additional information at this time.

## 2020-10-15 NOTE — Telephone Encounter (Signed)
Guy Begin, would you call patient and offer a video appointment today only if she wants? Happy to see them in clinic but there has been an increased in covid infections so if she prefers we can talk over the phone or over video instead. But happy to have her here as well and always happy to see her (make sure you let her and other patients know that)!  thanks

## 2020-10-15 NOTE — Telephone Encounter (Signed)
error 

## 2020-10-15 NOTE — Telephone Encounter (Signed)
I called [facility] @ [phone] (opt 4) and spoke with Dara. She confirmed with the scheduler that they have everything they need for this patient and they do not need any additional information at this time.

## 2020-10-15 NOTE — Telephone Encounter (Signed)
[name]: can you call patient and let her know that   1. I called [facility], they said they gave her 257m pills so she should take 1 pill in the morning and 1 mill in the evening of her lamictal. If she is concerned, she can [place] the [place] to [place] can [place] the pill and examine it. Usually oills have a code on them and if we can see the code thn we can verify it is a lamictal 2030mpill. I did give her 3 months supply so maybe that is why she feels she has so many  2. [place] [facility] [place] [place] with all her past MRIs on them to [facility] to Dr [facility]Arlan Organt [facility]  [facility]. [facility] get the [facility], I asked taylor to resend in another patient message with my recent notes. fyi

## 2020-10-15 NOTE — Telephone Encounter (Signed)
I will resend the referral for Dr. Arlan Organ and Dr. Vallarie Mare and make sure they receive it.

## 2020-10-15 NOTE — Telephone Encounter (Signed)
I'll take care of it thanks

## 2020-10-15 NOTE — Telephone Encounter (Signed)
I spoke with the patient and she said she preferred to speak over the phone. I confirmed that Dr. Jaynee Eagles will call her at 1:30 and switched appt over to a virtual visit.

## 2020-10-20 ENCOUNTER — Other Ambulatory Visit: Payer: Self-pay | Admitting: Neurology

## 2020-10-20 MED ORDER — OXCARBAZEPINE 300 MG PO TABS: ORAL_TABLET | ORAL | 6 refills | Status: DC

## 2020-10-20 MED ORDER — LAMOTRIGINE 100 MG PO TABS: ORAL_TABLET | ORAL | 11 refills | Status: DC

## 2020-10-20 NOTE — Telephone Encounter (Signed)
Patient brought Lamotrigine bottles to office and Dr Jaynee Eagles looked at them for pt.

## 2020-10-20 NOTE — Telephone Encounter (Signed)
Pt called, the lamoTRIgine (LAMICTAL) 200 MG tablet picked up at the pharmacy was the wrong mg. Was suppose to be 200 mg instead 25 mg. Want to drop off at your office. Would like a call from the nurse.

## 2020-10-21 ENCOUNTER — Telehealth: Payer: Self-pay | Admitting: Internal Medicine

## 2020-10-21 NOTE — Telephone Encounter (Signed)
Patient is returning the call, please advise. CB is [phone]

## 2020-10-21 NOTE — Telephone Encounter (Signed)
I spoke with the patients pharmacy and they stated that the patient's insurance would not cover any additional refills until 07/17 due to patient picking up her prescription too early. I left a message for the patient to return my call to the office.

## 2020-10-21 NOTE — Telephone Encounter (Signed)
Left a message for the patient to return my call.  

## 2020-10-21 NOTE — Telephone Encounter (Signed)
Patient is calling and requesting a refill for carvedilol (COREG) 25 MG tablet to be sent to   [facility], [address] AT [address]  [place], [place] [address]  Phone:  [phone] Fax:  [phone]

## 2020-10-22 ENCOUNTER — Other Ambulatory Visit: Payer: Self-pay | Admitting: Internal Medicine

## 2020-10-22 NOTE — Telephone Encounter (Signed)
Patient informed of the message below and verbalized understanding.

## 2020-10-24 ENCOUNTER — Other Ambulatory Visit: Payer: Self-pay | Admitting: Internal Medicine

## 2020-10-28 DIAGNOSIS — Z9889 Other specified postprocedural states: Secondary | ICD-10-CM | POA: Diagnosis not present

## 2020-10-28 DIAGNOSIS — G500 Trigeminal neuralgia: Secondary | ICD-10-CM | POA: Diagnosis not present

## 2020-11-01 NOTE — Telephone Encounter (Signed)
I have been out of office  Refill what she is taking  bid  benazepril disp 180  with refill x 1   Correct the med list   Forwarding this to pharmacy team also since they are involved

## 2020-11-02 ENCOUNTER — Other Ambulatory Visit: Payer: Self-pay

## 2020-11-02 MED ORDER — BENAZEPRIL HCL 20 MG PO TABS: 20.0000 mg | ORAL_TABLET | Freq: Two times a day (BID) | ORAL | 1 refills | Status: DC

## 2020-11-02 NOTE — Progress Notes (Signed)
No  uti  potassium stable . Gfr 47

## 2020-11-02 NOTE — Addendum Note (Signed)
Addended by: Nilda Riggs on: 11/02/2020 08:40 AM   Modules accepted: Orders

## 2020-11-02 NOTE — Telephone Encounter (Signed)
Medication list has been updated and new rx sent and patient is aware.

## 2020-11-03 ENCOUNTER — Telehealth: Payer: Self-pay | Admitting: Internal Medicine

## 2020-11-03 NOTE — Telephone Encounter (Signed)
Called patient to schedule virtual appointment with Dr. Regis Bill to discuss dosage.

## 2020-11-05 ENCOUNTER — Telehealth: Payer: Medicare Other | Admitting: Internal Medicine

## 2020-11-05 ENCOUNTER — Telehealth: Payer: Self-pay | Admitting: Adult Health

## 2020-11-05 ENCOUNTER — Other Ambulatory Visit: Payer: Self-pay | Admitting: Neurology

## 2020-11-05 ENCOUNTER — Other Ambulatory Visit (INDEPENDENT_AMBULATORY_CARE_PROVIDER_SITE_OTHER): Payer: Medicare Other

## 2020-11-05 DIAGNOSIS — Z0289 Encounter for other administrative examinations: Secondary | ICD-10-CM

## 2020-11-05 DIAGNOSIS — Z79899 Other long term (current) drug therapy: Secondary | ICD-10-CM

## 2020-11-05 DIAGNOSIS — R030 Elevated blood-pressure reading, without diagnosis of hypertension: Secondary | ICD-10-CM | POA: Diagnosis not present

## 2020-11-05 DIAGNOSIS — M5416 Radiculopathy, lumbar region: Secondary | ICD-10-CM | POA: Diagnosis not present

## 2020-11-05 DIAGNOSIS — G500 Trigeminal neuralgia: Secondary | ICD-10-CM | POA: Diagnosis not present

## 2020-11-05 NOTE — Telephone Encounter (Signed)
In response to the Nixon message from Octa on 6-13 Pt states she was told by Romelle Starcher, RN that Dr Jaynee Eagles wanted to see her today(6-16).  Pt is asking for a call back from [place], RN to discuss, the my chart message was read to pt multiple times on both times that she called this morning.

## 2020-11-05 NOTE — Telephone Encounter (Signed)
I reviewed the chart. Dr Jaynee Eagles had messaged the patient about coming in for labs, no mention of need for office visit. I called the patient. She will come for labs today. Her questions were answered.

## 2020-11-05 NOTE — Progress Notes (Signed)
cmp

## 2020-11-09 ENCOUNTER — Telehealth: Payer: Self-pay

## 2020-11-09 NOTE — Telephone Encounter (Signed)
Sent pt message via Mychart stating  Hi [name], I just wanted to let you know that cbc/cmp is unremarkable with no concerns including normal sodium (great news, in the past you were hyonatremic). Still awaiting the lamictal and oxcarb levels. If they come back abnormal we will notify you. If you have any questions, please let us know! Have a great evening.

## 2020-11-09 NOTE — Progress Notes (Signed)
Chief Complaint  Patient presents with   Medication Consultation     HPI: [name] 80 y.o. come in for C new problem sent in by Dr. Lavell Anchors. She thinks she had an acute reaction to Trileptal had been on a few days and stopped it after this event. She was hungry in the middle of the night got up had some tea and muffin cake.  Had acute problem of inability to swallow difficulty breathing without coughing no itching had to spit back out what ever was in her mouth as it felt blocked. After that she had breathing issues that sounded like stridor used some Symbicort and symptoms improved however since that time she has continued to have a trouble swallowing unless it is liquid and asked to put things on 1 side of her mouth.  Denies any new shortness of breath.  Feels like things get stuck high up in her throat. There were no nuts. or think she could be allergic to in the muffin and no itching or initial coughing This whole problem triggers for [name] is still having excess pain and wonders if something else can be prescribed. Dr. Lavell Anchors directed her to PCP office for evaluation and help with the symptoms.  Other history she sometimes has problems swallowing pills but no remember choking heartburn takes omeprazole only occasionally.  Sometimes gets confused about her medicine and her husband has taken over giving her the pills to be sure there are no problems. She has lost 30 to 40 pounds over the last number of months on purpose with a gluten-free diet because she had gained so much weight to 170.  There is a planned procedure to help with her T GN later in the summer.  She denies angioedema swelling and itching.  Or unusual rash. ROS: See pertinent positives and negatives per HPI.  Past Medical History:  Diagnosis Date   Abdominal pain 05/29/2013   s/p rx of cephalo resistant e coli   but last rx NG  now residular ?  bladder sx repeat cx sx rx to ty and uro consult    ADJ  DISORDER WITH MIXED ANXIETY & DEPRESSED MOOD 03/03/2010   Qualifier: Diagnosis of  By: [name], [name]    Agent resistant to multiple antibiotics 05/29/2013   e coli   bu NG on fu.     ARF (acute renal failure) ([place]) 03/12/2015   Closed head injury 02/01/2011   from syncope and had scalp laceration  neg ct .     Closed head injury 5-6 yrs ago   Colitis 10/29/4501   Complication of anesthesia    migraine several hours after general anesthesia   Fatty liver    Gall stones 2016   see ct scan neg HIDA    GERD (gastroesophageal reflux disease)    Hearing aid worn    HOH (hard of hearing)    both ears   Hyperlipidemia    Hypertension    echo nl lv function  mild dilitation 2009   Kidney infection    few yrs ago in hospital   Medication side effect 09/02/2010   Poss muscle se of 10 crestor    Migraine    hypnic HA eval by Dr. 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    Pyelonephritis 03/12/2015   Sensation of pain in anesthetized distribution of trigeminal nerve    Syncope 02/01/2011   In shower  on vacation  sustained head laceration  8 sutures Had ed visit neg head ct labs and x ray    Trigeminal neuralgia pain     Family History  Problem Relation Age of Onset   Ovarian cancer Mother    Stroke Mother    Alcohol abuse Father    Stroke Father    Diabetes Brother    Cancer Paternal Aunt        leukemia, unknown type   Seizures Daughter    Hypertension Other    Colon cancer Neg Hx    Dementia Neg Hx    Alzheimer's disease Neg Hx     Social History   Socioeconomic History   Marital status: Married    Spouse name: Not on file   Number of children: 2   Years of education: Not on file   Highest education level: Not on file  Occupational History    Comment: retired Forensic psychologist  Tobacco Use   Smoking status: Never   Smokeless tobacco: Never  Vaping Use   Vaping Use: Never used  Substance and Sexual Activity   Alcohol use: Not Currently     Alcohol/week: 2.0 standard drinks    Types: 2 Glasses of wine per week    Comment: occ wine   Drug use: No   Sexual activity: Not on file  Other Topics Concern   Not on file  Social History Narrative   Married   HH of 2-3 (god daughter)   Pets 2 dogs   Non smoker    Child is a Engineer, drilling   G2P2      Caffeine: 2 cups/day   Social Determinants of Radio broadcast assistant Strain: Low Risk    Difficulty of Paying Living Expenses: Not hard at all  Food Insecurity: No Food Insecurity   Worried About Charity fundraiser in the Last Year: Never true   Ran Out of Food in the Last Year: Never true  Transportation Needs: No Transportation Needs   Lack of Transportation (Medical): No   Lack of Transportation (Non-Medical): No  Physical Activity: Inactive   Days of Exercise per Week: 0 days   Minutes of Exercise per Session: 0 min  Stress: No Stress Concern Present   Feeling of Stress : Not at all  Social Connections: Moderately Integrated   Frequency of Communication with Friends and Family: More than three times a week   Frequency of Social Gatherings with Friends and Family: Once a week   Attends Religious Services: 1 to 4 times per year   Active Member of [employer] or Organizations: No   Attends Archivist Meetings: Never   Marital Status: Married    Outpatient Medications Prior to Visit  Medication Sig Dispense Refill   Acetaminophen (TYLENOL PO) Take 2 tablets by mouth as needed.     alendronate (FOSAMAX) 70 MG tablet TAKE 1 TABLET BY MOUTH 1 TIME A WEEK WITH FULL GLASS OF WATER AND ON AN EMPTY STOMACH AS DIRECTED 4 tablet 2   benazepril (LOTENSIN) 20 MG tablet Take 1 tablet (20 mg total) by mouth 2 (two) times daily. 180 tablet 1   butalbital-acetaminophen-caffeine (FIORICET WITH CODEINE) 50-325-40-30 MG capsule Take 1 capsule by mouth every 4 (four) hours as needed for headache. Rescue 30 capsule 0   carvedilol (COREG) 25 MG tablet TAKE 1 TABLET(25 MG) BY MOUTH  TWICE DAILY WITH A MEAL 60 tablet 3   conjugated estrogens (PREMARIN) vaginal cream 2 (two)  times a week.     fluticasone (FLONASE) 50 MCG/ACT nasal spray Place 2 sprays into both nostrils as needed for allergies or rhinitis. 16 g 5   furosemide (LASIX) 40 MG tablet Take 1 tablet (40 mg total) by mouth daily. 30 tablet 3   lamoTRIgine (LAMICTAL) 100 MG tablet Take 2 pills in the morning(211m) and evening. May take an additional 1/2 pill(567m if needed with the morning and evening doses 150 tablet 11   Multiple Vitamin (MULTIVITAMIN ADULT PO) Take 1 tablet by mouth.     omeprazole (PRILOSEC) 20 MG capsule TAKE 1 CAPSULE(20 MG) BY MOUTH DAILY (Patient taking differently: Take 20 mg by mouth 2 (two) times a week.) 30 capsule 0   ondansetron (ZOFRAN-ODT) 4 MG disintegrating tablet Take 1-2 tablets (4-8 mg total) by mouth every 8 (eight) hours as needed for nausea. 60 tablet 3   OVER THE COUNTER MEDICATION OTC eye drops     Oxcarbazepine (TRILEPTAL) 300 MG tablet Take one pill in the morning and 1.5 pills in the evening. 75 tablet 6   No facility-administered medications prior to visit.     EXAM:  BP 136/82 (BP Location: Right Arm, Patient Position: Sitting, Cuff Size: Normal)   Pulse (!) 59   Temp 97.9 F (36.6 C) (Oral)   Ht 5' 6"  (1.676 m)   Wt 123 lb 3.2 oz (55.9 kg)   SpO2 97%   BMI 19.89 kg/m   Body mass index is 19.89 kg/m.  GENERAL: vitals reviewed and listed above, alert, oriented, appears well hydrated and in no acute distress HEENT: atraumatic, conjunctiva  clear, no obvious abnormalities on inspection of external nose and ears OP : no lesion edema or exudate soft palate symmetrical speech is normal NECK: no obvious masses on inspection palpation  LUNGS: clear to auscultation bilaterally, no wheezes, rales or rhonchi,  CV: HRRR, no clubbing cyanosis or  peripheral edema nl cap refill  MS: moves all extremities without noticeable focal  abnormality PSYCH: pleasant and  cooperative, no tremor is noted Lab Results  Component Value Date   WBC 5.2 11/05/2020   HGB 14.6 11/05/2020   HCT 42.5 11/05/2020   PLT 214 11/05/2020   GLUCOSE 94 11/05/2020   CHOL 279 (H) 05/27/2019   TRIG 78.0 05/27/2019   HDL 118.80 05/27/2019   LDLDIRECT 135.9 10/16/2012   LDLCALC 145 (H) 05/27/2019   ALT 16 11/05/2020   AST 18 11/05/2020   NA 136 11/05/2020   K 4.4 11/05/2020   CL 97 11/05/2020   CREATININE 1.06 (H) 11/05/2020   BUN 17 11/05/2020   CO2 29 11/05/2020   TSH 0.88 07/01/2020   INR 1.1 05/16/2020   HGBA1C 5.5 05/25/2018   BP Readings from Last 3 Encounters:  11/10/20 136/82  10/06/20 (!) 104/52  10/05/20 (!) 158/75    ASSESSMENT AND PLAN:  Discussed the following assessment and plan:  Swallowing problem - Appears to be in the oral throat phase - Plan: Ambulatory referral to ENT  Dysphagia, oropharyngeal phase  Medication management - Plan: Ambulatory referral to ENT  Medication side effect - possible  see text  - Plan: Ambulatory referral to ENT  Trigeminal neuralgia - Plan: Ambulatory referral to ENT  History of 2019 novel coronavirus disease ([id number] if it was the Trileptal as she still has continued problems.  But was the only new medicine for a couple days when she had this event. She is also on an ACE inhibitor but it does not really  sound like angioedema.  Although possible could contribute Although not reported any side effects of Fosamax alendronate we will stop this as this could cause esophageal problems. Plan referral ENT for swallowing evaluation stop the alendronate.( Will opine to ent for swallowing  testing)  He is quite apprehensive and does not want to take the Trileptal ever again. Revewie record  time counsel plan  45 minutes  -Patient advised to return or notify health care team  if  new concerns arise.  Patient Instructions  Stop the alendronate for now as this can cause  esophagus problems.  I agree stay off  the trileptal   for now  reaction   doesn't explain   all of your symptoms .  Take  the omeprazole every day .    We will do a referral to ENT  and swallowing  evaluation .   [name]. Crystall Donaldson M.D.

## 2020-11-09 NOTE — Progress Notes (Signed)
See telephone note from 11/09/20

## 2020-11-10 ENCOUNTER — Other Ambulatory Visit: Payer: Self-pay

## 2020-11-10 ENCOUNTER — Encounter: Payer: Self-pay | Admitting: Internal Medicine

## 2020-11-10 ENCOUNTER — Ambulatory Visit: Payer: Medicare Other | Admitting: Internal Medicine

## 2020-11-10 ENCOUNTER — Telehealth: Payer: Self-pay | Admitting: Neurology

## 2020-11-10 VITALS — BP 136/82 | HR 59 | Temp 97.9°F | Ht 66.0 in | Wt 123.2 lb

## 2020-11-10 DIAGNOSIS — Z79899 Other long term (current) drug therapy: Secondary | ICD-10-CM

## 2020-11-10 DIAGNOSIS — Z8616 Personal history of COVID-19: Secondary | ICD-10-CM

## 2020-11-10 DIAGNOSIS — R1312 Dysphagia, oropharyngeal phase: Secondary | ICD-10-CM

## 2020-11-10 DIAGNOSIS — T887XXA Unspecified adverse effect of drug or medicament, initial encounter: Secondary | ICD-10-CM | POA: Diagnosis not present

## 2020-11-10 DIAGNOSIS — R1310 Dysphagia, unspecified: Secondary | ICD-10-CM | POA: Diagnosis not present

## 2020-11-10 DIAGNOSIS — G500 Trigeminal neuralgia: Secondary | ICD-10-CM

## 2020-11-10 LAB — CBC WITH DIFFERENTIAL/PLATELET
Basophils Absolute: 0.1 10*3/uL (ref 0.0–0.2)
Basos: 1 %
EOS (ABSOLUTE): 0.3 10*3/uL (ref 0.0–0.4)
Eos: 6 %
Hematocrit: 42.5 % (ref 34.0–46.6)
Hemoglobin: 14.6 g/dL (ref 11.1–15.9)
Immature Grans (Abs): 0 10*3/uL (ref 0.0–0.1)
Immature Granulocytes: 0 %
Lymphocytes Absolute: 1.1 10*3/uL (ref 0.7–3.1)
Lymphs: 22 %
MCH: 29.5 pg (ref 26.6–33.0)
MCHC: 34.4 g/dL (ref 31.5–35.7)
MCV: 86 fL (ref 79–97)
Monocytes Absolute: 0.5 10*3/uL (ref 0.1–0.9)
Monocytes: 9 %
Neutrophils Absolute: 3.2 10*3/uL (ref 1.4–7.0)
Neutrophils: 62 %
Platelets: 214 10*3/uL (ref 150–450)
RBC: 4.95 x10E6/uL (ref 3.77–5.28)
RDW: 13.8 % (ref 11.7–15.4)
WBC: 5.2 10*3/uL (ref 3.4–10.8)

## 2020-11-10 LAB — COMPREHENSIVE METABOLIC PANEL
ALT: 16 IU/L (ref 0–32)
AST: 18 IU/L (ref 0–40)
Albumin/Globulin Ratio: 2.2 (ref 1.2–2.2)
Albumin: 4.4 g/dL (ref 3.6–4.6)
Alkaline Phosphatase: 86 IU/L (ref 44–121)
BUN/Creatinine Ratio: 16 (ref 12–28)
BUN: 17 mg/dL (ref 8–27)
Bilirubin Total: 0.6 mg/dL (ref 0.0–1.2)
CO2: 29 mmol/L (ref 20–29)
Calcium: 9.7 mg/dL (ref 8.7–10.3)
Chloride: 97 mmol/L (ref 96–106)
Creatinine, Ser: 1.06 mg/dL — ABNORMAL HIGH (ref 0.57–1.00)
Globulin, Total: 2 g/dL (ref 1.5–4.5)
Glucose: 94 mg/dL (ref 65–99)
Potassium: 4.4 mmol/L (ref 3.5–5.2)
Sodium: 136 mmol/L (ref 134–144)
Total Protein: 6.4 g/dL (ref 6.0–8.5)
eGFR: 53 mL/min/{1.73_m2} — ABNORMAL LOW (ref >59)

## 2020-11-10 LAB — 10-HYDROXYCARBAZEPINE: Oxcarbazepine SerPl-Mcnc: <1 ug/mL — ABNORMAL LOW (ref 10–35)

## 2020-11-10 LAB — LAMOTRIGINE LEVEL: Lamotrigine Lvl: 17.2 ug/mL (ref 2.0–20.0)

## 2020-11-10 NOTE — Patient Instructions (Signed)
Stop the alendronate for now as this can cause  esophagus problems.  I agree stay off the trileptal   for now  reaction   doesn't explain   all of your symptoms .  Take  the omeprazole every day .    We will do a referral to ENT  and swallowing  evaluation .

## 2020-11-10 NOTE — Telephone Encounter (Signed)
-----   Message from [name], MD sent at 11/10/2020 12:28 PM EDT ----- La,mictal labs normal and the oxcarb looks fine too(since you stopped it is almost zero). Inother words, everything looks good, not toxic on any medication levels, thanks

## 2020-11-10 NOTE — Telephone Encounter (Signed)
Called the patient to review lab results.  Was able to go over everything.  Patient states that she had came off of the Trileptal, as Dr Jaynee Eagles was already aware.  She states that she is having a hard time controlling the breakthrough pain related to her trigeminal neuralgia.  Patient is asking if there can be a increase in the lamotrigine or if there is another medication Dr. Jaynee Eagles would recommend that may help with that.  Advised the patient I would contact her back once discussing with Dr. Jaynee Eagles.  She verbalized understanding and was appreciative for the call.

## 2020-11-11 NOTE — Telephone Encounter (Signed)
Pt called, need to know if can take an extra 100 mg of Lamotrigine. Would like a call from the nurse.

## 2020-11-11 NOTE — Telephone Encounter (Signed)
As soon as I discuss with Dr Jaynee Eagles and get her recommendation, I will contact the pt to discuss with her.

## 2020-11-12 ENCOUNTER — Other Ambulatory Visit: Payer: Self-pay | Admitting: Neurology

## 2020-11-12 ENCOUNTER — Telehealth: Payer: Self-pay | Admitting: Internal Medicine

## 2020-11-12 MED ORDER — OXCARBAZEPINE 300 MG PO TABS: 300.0000 mg | ORAL_TABLET | Freq: Two times a day (BID) | ORAL | 5 refills | Status: DC

## 2020-11-12 NOTE — Telephone Encounter (Signed)
Pt call and stated she is taken 2 furosemide (LASIX) 40 MG tablet a day and need a refill.

## 2020-11-12 NOTE — Telephone Encounter (Signed)
Called the pt back to advise of the recommendation made by Dr Jaynee Eagles. The pt agrees with the plan and will restart the trileptal 300 mg twice a day.  She is scheduled end or July/first of aug for [facility] procedure. She was appreciative for the call back.

## 2020-11-13 ENCOUNTER — Telehealth: Payer: Self-pay | Admitting: Pharmacist

## 2020-11-13 NOTE — Chronic Care Management (AMB) (Signed)
Chronic Care Management Pharmacy Assistant   Name: [name]  MRN: [id number] DOB: [birth year]  Reason for Encounter: Disease State   Conditions to be addressed/monitored: HTN  Recent office visits:  06.21.2022 [name], MD seen for acute issue for swallowing problems. Medication discontinued oxcarbazepine 300 mg 04.28.2022 refill request for Furosemide 40 mg daily 03.28.2022 [name], [name], MD follow-up appointment seen for edema. Medication change Benazepril HCl 20 mg twice daily.  Recent consult visits:  06.21.2022 [name], MD seen for acute issue for swallowing problems. Medication discontinued  06.16.2022 Vertell Limber MD, Marchia Meiers and Micael [facility] follow-up for Trigeminal neuralgia 05.17.2022 Almyra Deforest, [facility] hypertension, hyperlipidemia, trigeminal neuralgia and lower extremity edema   Hospital visits:  Medication Reconciliation was completed by comparing discharge summary, patient's EMR and Pharmacy list, and upon discussion with patient.  Admitted to the hospital on 05.16.2022 due to Kidney Stone. Discharge date was 05.16.2022. Discharged from [facility]?Medications Started at [facility] Discharge:?? -started None  Medication Changes at Hospital Discharge: -Changed None  Medications Discontinued at Hospital Discharge: -Stopped None  Medications that remain the same after Hospital Discharge:??  -All other medications will remain the same.    Medications: Outpatient Encounter Medications as of 11/13/2020  Medication Sig Note   Acetaminophen (TYLENOL PO) Take 2 tablets by mouth as needed. 05/16/2020: Used @ home PRN   alendronate (FOSAMAX) 70 MG tablet TAKE 1 TABLET BY MOUTH 1 TIME A WEEK WITH FULL GLASS OF WATER AND ON AN EMPTY STOMACH AS DIRECTED    benazepril (LOTENSIN) 20 MG tablet Take 1 tablet (20 mg total) by mouth 2 (two) times daily.     butalbital-acetaminophen-caffeine (FIORICET WITH CODEINE) 50-325-40-30 MG capsule Take 1 capsule by mouth every 4 (four) hours as needed for headache. Rescue 05/16/2020: Has @ home PRN   carvedilol (COREG) 25 MG tablet TAKE 1 TABLET(25 MG) BY MOUTH TWICE DAILY WITH A MEAL    conjugated estrogens (PREMARIN) vaginal cream 2 (two) times a week.    fluticasone (FLONASE) 50 MCG/ACT nasal spray Place 2 sprays into both nostrils as needed for allergies or rhinitis.    furosemide (LASIX) 40 MG tablet Take 1 tablet (40 mg total) by mouth daily. (Patient taking differently: Take by mouth daily.)    lamoTRIgine (LAMICTAL) 100 MG tablet Take 2 pills in the morning(212m) and evening. May take an additional 1/2 pill(555m if needed with the morning and evening doses    Multiple Vitamin (MULTIVITAMIN ADULT PO) Take 1 tablet by mouth.    omeprazole (PRILOSEC) 20 MG capsule TAKE 1 CAPSULE(20 MG) BY MOUTH DAILY (Patient taking differently: Take 20 mg by mouth 2 (two) times a week.)    ondansetron (ZOFRAN-ODT) 4 MG disintegrating tablet Take 1-2 tablets (4-8 mg total) by mouth every 8 (eight) hours as needed for nausea. 05/16/2020: Has @ home PRN   OVER THE COUNTER MEDICATION OTC eye drops    Oxcarbazepine (TRILEPTAL) 300 MG tablet Take 1 tablet (300 mg total) by mouth 2 (two) times daily.    No facility-administered encounter medications on file as of 11/13/2020.   Reviewed chart prior to disease state call. Spoke with patient regarding BP  Recent Office Vitals: BP Readings from Last 3 Encounters:  11/10/20 136/82  10/06/20 (!) 104/52  10/05/20 (!) 158/75   Pulse Readings from Last 3 Encounters:  11/10/20 (!) 59  10/06/20 62  10/05/20 66  Wt Readings from Last 3 Encounters:  11/10/20 123 lb 3.2 oz (55.9 kg)  10/06/20 128 lb 9.6 oz (58.3 kg)  08/17/20 127 lb 3.2 oz (57.7 kg)     Kidney Function Lab Results  Component Value Date/Time   CREATININE 1.11 (H) 11/26/2020 11:05 AM   CREATININE 1.06 (H)  11/05/2020 11:41 AM   GFR 46.21 (L) 10/01/2020 10:00 AM   GFRNONAA 50 (L) 10/05/2020 05:18 PM   GFRAA 62 07/08/2020 04:53 PM    BMP Latest Ref Rng & Units 11/26/2020 11/05/2020 10/05/2020  Glucose 65 - 99 mg/dL 100(H) 94 132(H)  BUN 8 - 27 mg/dL 20 17 22   Creatinine 0.57 - 1.00 mg/dL 1.11(H) 1.06(H) 1.11(H)  BUN/Creat Ratio 12 - 28 18 16  -  Sodium 134 - 144 mmol/L 142 136 139  Potassium 3.5 - 5.2 mmol/L 4.5 4.4 4.8  Chloride 96 - 106 mmol/L 97 97 100  CO2 20 - 29 mmol/L 30(H) 29 32  Calcium 8.7 - 10.3 mg/dL 10.5(H) 9.7 9.9   Current antihypertensive regimen:  Benazepril 20 mg 1 tablet twice daily Carvedilol 25 mg 1 tablet BID Furosemide 40 mg 1 tablet daily  How often are you checking your Blood Pressure? 1-2x per week Current home BP readings:  06.21 136/82 06.22 136/80 06.24 132/76 What recent interventions/DTPs have been made by any provider to improve Blood Pressure control since last CPP Visit: Furosemide increased to 40 mg daily Any recent hospitalizations or ED visits since last visit with CPP? Yes What diet changes have been made to improve Blood Pressure Control?  No Change What exercise is being done to improve your Blood Pressure Control?  No Change   Adherence Review: Is the patient currently on ACE/ARB medication? Yes Does the patient have >5 day gap between last estimated fill dates? No  I spoke with the patient about medication adherence. Since her last CCM visit, she has had some changes to her medication. It was noted above. she states that she is not having she currently not experiencing any side effects from her current medication. There have been no other changes to her medications. She stated that she needed a refill of Furosemide 40 mg. The patient was informed to please contact her primary care doctor because they were trying to reach out to her as well. [facility]. There has been no other emergency department  visits hospital visits or urgent care visits. She currently uses [facility] and does not have any issues. Her next CCM visit is scheduled for September 2022. Care Gaps:  Star Rating Drugs: Medication Dispensed  Quantity Pharmacy  Benazepril 20 mg 06.03.2022 [facility]    Amilia ([place]) Mare Ferrari, [place] Pharmacist Assistant [phone]

## 2020-11-15 NOTE — Telephone Encounter (Signed)
She should be taking 40 mg per day  In the past she took 2 x 20 mg  and we changed this to 1 x 40 mg .

## 2020-11-16 NOTE — Telephone Encounter (Signed)
Left a message for the pt to return my call.  

## 2020-11-17 MED ORDER — FUROSEMIDE 40 MG PO TABS: 40.0000 mg | ORAL_TABLET | Freq: Every day | ORAL | 3 refills | Status: DC

## 2020-11-17 NOTE — Addendum Note (Signed)
Addended by: Nilda Riggs on: 11/17/2020 03:47 PM   Modules accepted: Orders

## 2020-11-17 NOTE — Telephone Encounter (Signed)
I spoke with the pt and informed her that she is supposed to take Lasix 40 mg once daily. Rx sent to pt's pharmacy.

## 2020-11-24 ENCOUNTER — Telehealth: Payer: Self-pay | Admitting: Adult Health

## 2020-11-24 ENCOUNTER — Telehealth: Payer: Self-pay | Admitting: Internal Medicine

## 2020-11-24 NOTE — Telephone Encounter (Signed)
Pt called stating she has a mixup with her medications this weekend and is needing to be advised about her medications by the RN.

## 2020-11-24 NOTE — Telephone Encounter (Signed)
Pt is calling in stating that she needs a call back due to her having trouble with her medication and need some help with getting them straighten out.  Pt would like to have a call back.

## 2020-11-24 NOTE — Telephone Encounter (Signed)
I spoke with the patient and she stated that she is having side effects with her Oxcarbazepine 377m medication that were prescribed by Dr. ALavell Anchors Patient stated that she experiences Dyphagia, confusion, Shortness of breath, Headaches, drowsiness, upset stomach and swelling in hands and lips. Pt wanted to know if this medication is still safe and how much should she take. Pt stated she wanted to informed PCP so that she could consult with Dr. ALavell Anchorson what would be best moving forward.

## 2020-11-24 NOTE — Telephone Encounter (Signed)
Pt called wanting a call back, says she is having breakthrough pain.

## 2020-11-25 ENCOUNTER — Other Ambulatory Visit: Payer: Self-pay | Admitting: Neurology

## 2020-11-25 DIAGNOSIS — Z79899 Other long term (current) drug therapy: Secondary | ICD-10-CM

## 2020-11-25 MED ORDER — OXCARBAZEPINE 300 MG PO TABS: 150.0000 mg | ORAL_TABLET | Freq: Two times a day (BID) | ORAL | 5 refills | Status: DC

## 2020-11-25 NOTE — Telephone Encounter (Signed)
See other phone note

## 2020-11-25 NOTE — Telephone Encounter (Signed)
I called pt back  relayed the message from Dr. Jaynee Eagles. Get labwork today.  Will change trileptal to 175m po bid.  (Taking 1/2 tablet of the 3080mtab) po bid .  Husband was there to hear plan, but pt verbalized understanding.  I filled as they said they did not have much.  Confirmed the pharmacy.

## 2020-11-25 NOTE — Telephone Encounter (Signed)
I called pt and she is c/o worsening sx of not beeing about to eat, can't swallow, dizzy, has balance, foggyheadedness.  She stated to me that oxcarbazepine is what is causing this, she had since started taking it(could not tell me when that was),  then would come and go.  Now is worse.  Taking lamotrigine 254m po bid, and 529mBID for breakthru, also the oxcarbazepine 30066mo bid.  She was afraid of stopping these as the TN pain would come back.  Please advise.

## 2020-11-26 ENCOUNTER — Other Ambulatory Visit (INDEPENDENT_AMBULATORY_CARE_PROVIDER_SITE_OTHER): Payer: Medicare Other

## 2020-11-26 DIAGNOSIS — Z79899 Other long term (current) drug therapy: Secondary | ICD-10-CM | POA: Diagnosis not present

## 2020-11-26 DIAGNOSIS — M6281 Muscle weakness (generalized): Secondary | ICD-10-CM | POA: Diagnosis not present

## 2020-11-26 DIAGNOSIS — I10 Essential (primary) hypertension: Secondary | ICD-10-CM | POA: Diagnosis not present

## 2020-11-26 DIAGNOSIS — Z0289 Encounter for other administrative examinations: Secondary | ICD-10-CM

## 2020-11-26 NOTE — Telephone Encounter (Signed)
I LMVM for pts husband on phone re: medications (see mychart message).

## 2020-11-26 NOTE — Telephone Encounter (Signed)
Pt here for labs.  Husband has questions about her medications.  He stated that she has been taking (this is different then what I was told previously).  Taking lamotrigine 267m po bid for the last 2 wks.  (I relayed should be 2035mpo bid 5068mdded for breakthru pain (AM/PM if needed).  Trileptal taking 300m71m and 450mg58m  He started the new dosing last night of trileptall 150mg 63mid.  She if very concerned that will start getting pain.  I relayed I will confirm with Dr. Ahern Jaynee Eaglesive her the update and then get back with pts husband.  The decrease was because of SE.

## 2020-11-30 DIAGNOSIS — I10 Essential (primary) hypertension: Secondary | ICD-10-CM | POA: Diagnosis not present

## 2020-11-30 DIAGNOSIS — Z87440 Personal history of urinary (tract) infections: Secondary | ICD-10-CM | POA: Diagnosis not present

## 2020-11-30 DIAGNOSIS — Z87442 Personal history of urinary calculi: Secondary | ICD-10-CM | POA: Diagnosis not present

## 2020-11-30 DIAGNOSIS — N952 Postmenopausal atrophic vaginitis: Secondary | ICD-10-CM | POA: Diagnosis not present

## 2020-11-30 DIAGNOSIS — M6281 Muscle weakness (generalized): Secondary | ICD-10-CM | POA: Diagnosis not present

## 2020-12-01 DIAGNOSIS — M6281 Muscle weakness (generalized): Secondary | ICD-10-CM | POA: Diagnosis not present

## 2020-12-01 DIAGNOSIS — I10 Essential (primary) hypertension: Secondary | ICD-10-CM | POA: Diagnosis not present

## 2020-12-01 LAB — COMPREHENSIVE METABOLIC PANEL
ALT: 15 IU/L (ref 0–32)
AST: 15 IU/L (ref 0–40)
Albumin/Globulin Ratio: 2.1 (ref 1.2–2.2)
Albumin: 4.5 g/dL (ref 3.6–4.6)
Alkaline Phosphatase: 81 IU/L (ref 44–121)
BUN/Creatinine Ratio: 18 (ref 12–28)
BUN: 20 mg/dL (ref 8–27)
Bilirubin Total: 0.6 mg/dL (ref 0.0–1.2)
CO2: 30 mmol/L — ABNORMAL HIGH (ref 20–29)
Calcium: 10.5 mg/dL — ABNORMAL HIGH (ref 8.7–10.3)
Chloride: 97 mmol/L (ref 96–106)
Creatinine, Ser: 1.11 mg/dL — ABNORMAL HIGH (ref 0.57–1.00)
Globulin, Total: 2.1 g/dL (ref 1.5–4.5)
Glucose: 100 mg/dL — ABNORMAL HIGH (ref 65–99)
Potassium: 4.5 mmol/L (ref 3.5–5.2)
Sodium: 142 mmol/L (ref 134–144)
Total Protein: 6.6 g/dL (ref 6.0–8.5)
eGFR: 50 mL/min/{1.73_m2} — ABNORMAL LOW (ref >59)

## 2020-12-01 LAB — CBC WITH DIFFERENTIAL/PLATELET
Basophils Absolute: 0.1 10*3/uL (ref 0.0–0.2)
Basos: 1 %
EOS (ABSOLUTE): 0.2 10*3/uL (ref 0.0–0.4)
Eos: 3 %
Hematocrit: 44.3 % (ref 34.0–46.6)
Hemoglobin: 14.1 g/dL (ref 11.1–15.9)
Immature Grans (Abs): 0 10*3/uL (ref 0.0–0.1)
Immature Granulocytes: 0 %
Lymphocytes Absolute: 0.9 10*3/uL (ref 0.7–3.1)
Lymphs: 14 %
MCH: 28.2 pg (ref 26.6–33.0)
MCHC: 31.8 g/dL (ref 31.5–35.7)
MCV: 89 fL (ref 79–97)
Monocytes Absolute: 0.5 10*3/uL (ref 0.1–0.9)
Monocytes: 8 %
Neutrophils Absolute: 5 10*3/uL (ref 1.4–7.0)
Neutrophils: 74 %
Platelets: 222 10*3/uL (ref 150–450)
RBC: 5 x10E6/uL (ref 3.77–5.28)
RDW: 13.5 % (ref 11.7–15.4)
WBC: 6.8 10*3/uL (ref 3.4–10.8)

## 2020-12-01 LAB — LAMOTRIGINE LEVEL: Lamotrigine Lvl: 14 ug/mL (ref 2.0–20.0)

## 2020-12-01 LAB — 10-HYDROXYCARBAZEPINE: Oxcarbazepine SerPl-Mcnc: 20 ug/mL (ref 10–35)

## 2020-12-02 DIAGNOSIS — I10 Essential (primary) hypertension: Secondary | ICD-10-CM | POA: Diagnosis not present

## 2020-12-02 DIAGNOSIS — M6281 Muscle weakness (generalized): Secondary | ICD-10-CM | POA: Diagnosis not present

## 2020-12-03 DIAGNOSIS — I10 Essential (primary) hypertension: Secondary | ICD-10-CM | POA: Diagnosis not present

## 2020-12-03 DIAGNOSIS — M6281 Muscle weakness (generalized): Secondary | ICD-10-CM | POA: Diagnosis not present

## 2020-12-07 DIAGNOSIS — I10 Essential (primary) hypertension: Secondary | ICD-10-CM | POA: Diagnosis not present

## 2020-12-07 DIAGNOSIS — M6281 Muscle weakness (generalized): Secondary | ICD-10-CM | POA: Diagnosis not present

## 2020-12-09 DIAGNOSIS — I10 Essential (primary) hypertension: Secondary | ICD-10-CM | POA: Diagnosis not present

## 2020-12-09 DIAGNOSIS — M6281 Muscle weakness (generalized): Secondary | ICD-10-CM | POA: Diagnosis not present

## 2020-12-10 DIAGNOSIS — I10 Essential (primary) hypertension: Secondary | ICD-10-CM | POA: Diagnosis not present

## 2020-12-10 DIAGNOSIS — M6281 Muscle weakness (generalized): Secondary | ICD-10-CM | POA: Diagnosis not present

## 2020-12-14 ENCOUNTER — Other Ambulatory Visit: Payer: Self-pay | Admitting: Internal Medicine

## 2020-12-14 DIAGNOSIS — I10 Essential (primary) hypertension: Secondary | ICD-10-CM | POA: Diagnosis not present

## 2020-12-14 DIAGNOSIS — M6281 Muscle weakness (generalized): Secondary | ICD-10-CM | POA: Diagnosis not present

## 2020-12-16 DIAGNOSIS — I10 Essential (primary) hypertension: Secondary | ICD-10-CM | POA: Diagnosis not present

## 2020-12-16 DIAGNOSIS — M6281 Muscle weakness (generalized): Secondary | ICD-10-CM | POA: Diagnosis not present

## 2020-12-16 NOTE — Telephone Encounter (Signed)
Mailed copy of message to pts husband.

## 2020-12-17 DIAGNOSIS — I10 Essential (primary) hypertension: Secondary | ICD-10-CM | POA: Diagnosis not present

## 2020-12-17 DIAGNOSIS — M6281 Muscle weakness (generalized): Secondary | ICD-10-CM | POA: Diagnosis not present

## 2020-12-21 ENCOUNTER — Other Ambulatory Visit: Payer: Self-pay

## 2020-12-21 ENCOUNTER — Ambulatory Visit (INDEPENDENT_AMBULATORY_CARE_PROVIDER_SITE_OTHER): Payer: Medicare Other | Admitting: Otolaryngology

## 2020-12-21 DIAGNOSIS — I10 Essential (primary) hypertension: Secondary | ICD-10-CM | POA: Diagnosis not present

## 2020-12-21 DIAGNOSIS — R1312 Dysphagia, oropharyngeal phase: Secondary | ICD-10-CM | POA: Diagnosis not present

## 2020-12-21 DIAGNOSIS — R2689 Other abnormalities of gait and mobility: Secondary | ICD-10-CM | POA: Diagnosis not present

## 2020-12-21 NOTE — Progress Notes (Addendum)
HPI: [name] is a 80 y.o. female who presents is referred by her PCP Dr. Regis Bill for evaluation of dysphagia.  Patient has been seen neurologist Dr Jaynee Eagles as well as Dr. Regis Bill for treatment of trigeminal neuralgia which was causing severe right-sided facial pain.  She was treated with some medication that seemed to help the trigeminal neuralgia pain but she felt like had caused her some problems with sore throat and difficulty swallowing.  She takes several medications and she frequently has difficulty swallowing pills more on the right side of her throat than the left side.  She describes difficulty swallowing much higher in her throat up in the area of the back of her throat then low down.  She has no difficulty with her voice and no hoarseness and no trouble breathing. [name] feels like the symptoms may be related to the medication she takes for the trigeminal neuralgia as the symptoms started shortly after the taking this medication.  Past Medical History:  Diagnosis Date   Abdominal pain 05/29/2013   s/p rx of cephalo resistant e coli   but last rx NG  now residular ?  bladder sx repeat cx sx rx to ty and uro consult    ADJ DISORDER WITH MIXED ANXIETY & DEPRESSED MOOD 03/03/2010   Qualifier: Diagnosis of  By: [name], [name]    Agent resistant to multiple antibiotics 05/29/2013   e coli   bu NG on fu.     ARF (acute renal failure) ([place]) 03/12/2015   Closed head injury 02/01/2011   from syncope and had scalp laceration  neg ct .     Closed head injury 5-6 yrs ago   Colitis 11/25/1023   Complication of anesthesia    migraine several hours after general anesthesia   Fatty liver    Gall stones 2016   see ct scan neg [facility]    GERD (gastroesophageal reflux disease)    Hearing aid worn    HOH (hard of hearing)    both ears   Hyperlipidemia    Hypertension    echo nl lv function  mild dilitation 2009   Kidney infection    few yrs ago in hospital   Medication side effect  09/02/2010   Poss muscle se of 10 crestor    Migraine    hypnic HA eval by Dr. 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    Pyelonephritis 03/12/2015   Sensation of pain in anesthetized distribution of trigeminal nerve    Syncope 02/01/2011   In shower on vacation  sustained head laceration  8 sutures Had ed visit neg head ct labs and x ray    Trigeminal neuralgia pain    Past Surgical History:  Procedure Laterality Date   ABDOMINAL HYSTERECTOMY  2002   tubal   CARDIAC CATHETERIZATION  2000   chest pains neg   CHOLECYSTECTOMY N/A 02/21/2017   Procedure: LAPAROSCOPIC CHOLECYSTECTOMY WITH INTRAOPERATIVE CHOLANGIOGRAM;  Surgeon: Armandina Gemma, MD;  Location: [facility];  Service: General;  Laterality: N/A;   COLONOSCOPY     multiple   CRANIOTOMY  12/09/2011   nerve decompression right trigeminal    DOPPLER ECHOCARDIOGRAPHY  2009   nl lv function mild lv dilitation   EYE SURGERY Bilateral    ioc for catatracts   laparoscopic gallbladder surgery  02/16/2017   Fax from [place] Bilateral 2002   rt shoulder surgery  Social History   Socioeconomic History   Marital status: Married    Spouse name: Not on file   Number of children: 2   Years of education: Not on file   Highest education level: Not on file  Occupational History    Comment: retired Forensic psychologist  Tobacco Use   Smoking status: Never   Smokeless tobacco: Never  Vaping Use   Vaping Use: Never used  Substance and Sexual Activity   Alcohol use: Not Currently    Alcohol/week: 2.0 standard drinks    Types: 2 Glasses of wine per week    Comment: occ wine   Drug use: No   Sexual activity: Not on file  Other Topics Concern   Not on file  Social History Narrative   Married   HH of 2-3 (god daughter)   Pets 2 dogs   Non smoker    Child is a Engineer, drilling   G2P2      Caffeine: 2 cups/day   Social Determinants of Radio broadcast assistant Strain: Low Risk     Difficulty of Paying Living Expenses: Not hard at all  Food Insecurity: No Food Insecurity   Worried About Charity fundraiser in the Last Year: Never true   [place] in the Last Year: Never true  Transportation Needs: No Transportation Needs   Lack of Transportation (Medical): No   Lack of Transportation (Non-Medical): No  Physical Activity: Inactive   Days of Exercise per Week: 0 days   Minutes of Exercise per Session: 0 min  Stress: No Stress Concern Present   Feeling of Stress : Not at all  Social Connections: Moderately Integrated   Frequency of Communication with Friends and Family: More than three times a week   Frequency of Social Gatherings with Friends and Family: Once a week   Attends Religious Services: 1 to 4 times per year   Active Member of [employer] or Organizations: No   Attends Music therapist: Never   Marital Status: Married   Family History  Problem Relation Age of Onset   Ovarian cancer Mother    Stroke Mother    Alcohol abuse Father    Stroke Father    Diabetes Brother    Cancer Paternal Aunt        leukemia, unknown type   Seizures Daughter    Hypertension Other    Colon cancer Neg Hx    Dementia Neg Hx    Alzheimer's disease Neg Hx    Allergies  Allergen Reactions   Elemental Sulfur Hives and Itching   Sulfamethoxazole-Trimethoprim Other (See Comments)    REACTION: unspecified   Prior to Admission medications   Medication Sig Start Date End Date Taking? Authorizing Provider  Acetaminophen (TYLENOL PO) Take 2 tablets by mouth as needed.    [provider]  alendronate (FOSAMAX) 70 MG tablet TAKE 1 TABLET BY MOUTH 1 TIME A WEEK WITH FULL GLASS OF WATER AND ON AN EMPTY STOMACH AS DIRECTED 10/26/20   [name], [name], MD  benazepril (LOTENSIN) 20 MG tablet Take 1 tablet (20 mg total) by mouth 2 (two) times daily. 11/02/20   [name], [name], MD  butalbital-acetaminophen-caffeine (FIORICET WITH CODEINE) [id number] MG capsule  Take 1 capsule by mouth every 4 (four) hours as needed for headache. Rescue 04/22/20   [name], [name], MD  carvedilol (COREG) 25 MG tablet TAKE 1 TABLET(25 MG) BY MOUTH TWICE DAILY WITH A MEAL 09/29/20  [name], [name], MD  conjugated estrogens (PREMARIN) vaginal cream 2 (two) times a week.    [provider]  fluticasone (FLONASE) 50 MCG/ACT nasal spray Place 2 sprays into both nostrils as needed for allergies or rhinitis. 02/11/20   [name], [name], MD  furosemide (LASIX) 40 MG tablet Take 1 tablet (40 mg total) by mouth daily. 11/17/20   [name], [name], MD  lamoTRIgine (LAMICTAL) 100 MG tablet Take 2 pills in the morning(2109m) and evening. May take an additional 1/2 pill(588m if needed with the morning and evening doses 10/20/20   AhMelvenia BeamMD  Multiple Vitamin (MULTIVITAMIN ADULT PO) Take 1 tablet by mouth.    [provider]  omeprazole (PRILOSEC) 20 MG capsule TAKE 1 CAPSULE(20 MG) BY MOUTH DAILY Patient taking differently: Take 20 mg by mouth 2 (two) times a week. 07/16/20   [name], WaStandley BrookingMD  ondansetron (ZOFRAN-ODT) 4 MG disintegrating tablet Take 1-2 tablets (4-8 mg total) by mouth every 8 (eight) hours as needed for nausea. 09/04/19   AhMelvenia BeamMD  OVER THE COUNTER MEDICATION OTC eye drops    [provider]  Oxcarbazepine (TRILEPTAL) 300 MG tablet Take 0.5 tablets (150 mg total) by mouth 2 (two) times daily. 11/25/20   AhMelvenia BeamMD     Positive ROS: Otherwise negative  All other systems have been reviewed and were otherwise negative with the exception of those mentioned in the HPI and as above.  Physical Exam: Constitutional: Alert, well-appearing, no acute distress Ears: External ears without lesions or tenderness. Ear canals are clear bilaterally with intact, clear TMs.  Patient wears bilateral hearing aids. Nasal: External nose without lesions. Septum relatively midline with clear nasal passages bilaterally.  Both middle meatus  regions are clear with no signs of infection.  No polyps noted..  Oral: Lips and gums without lesions. Tongue and palate mucosa without lesions. Posterior oropharynx clear.  Patient has small symmetric appearing tonsils bilaterally with no exudate.  Indirect laryngoscopy revealed a clear base of tongue vallecula and epiglottis. Fiberoptic laryngoscopy was performed through the right nostril.  The nasopharynx was clear.  The base of tongue vallecula epiglottis were normal.  Vocal cords were clear bilaterally with normal vocal mobility.  Both piriform sinuses were clear with no pooling.  Of note she  did have a strong gag reflex. Neck: No palpable adenopathy or masses.  No palpable thyroid nodules. Respiratory: Breathing comfortably  Skin: No facial/neck lesions or rash noted.  Laryngoscopy  Date/Time: 12/21/2020 5:10 PM Performed by: NeRozetta NunneryMD Authorized by: NeRozetta NunneryMD   Consent:    Consent obtained:  Verbal   Consent given by:  Patient Procedure details:    Indications: direct visualization of the upper aerodigestive tract and hoarseness, dysphagia, or aspiration     Medication:  Afrin   Scope location: right nare   Sinus:    Right middle meatus: normal     Right nasopharynx: normal   Mouth:    Oropharynx: normal     Vallecula: normal     Base of tongue: normal     Epiglottis: normal   Throat:    Pyriform sinus: normal     True vocal cords: normal   Comments:     On fiberoptic laryngoscopy through the right nostril the nasopharynx was clear.  The base of tongue vallecula and epiglottis were normal.  Piriform sinuses were clear bilaterally and vocal cords were clear with normal vocal mobility.  No structural abnormalities noted.  Assessment: Dysphagia questionable etiology.  Normal upper airway examination with no structural abnormalities noted.  Plan: Reviewed with the patient concerning normal upper airway examination with no evidence of neoplasm or  infection and no structural abnormalities noted. For further evaluation of dysphagia I discussed ordering a modified barium swallow that is performed with speech therapy as her dysphagia is more of a functional problem than a structural abnormality on my clinical examination and they may be able to help her with the dysphagia. The patient would like to discuss this with Dr. Regis Bill prior to scheduling this and I discussed that Dr. Regis Bill could order the modified barium swallow which would be the neck step in evaluation of her trouble swallowing.   Radene Journey, MD   CC:

## 2020-12-22 ENCOUNTER — Telehealth: Payer: Self-pay | Admitting: Internal Medicine

## 2020-12-22 NOTE — Telephone Encounter (Signed)
Opened in error

## 2020-12-22 NOTE — Telephone Encounter (Signed)
Pt states she need to talk to the [facility] or Dr  regarding these two medication . Pt states she is not on my chart and her only contact is through e mail or by phone    Oxcarbazepine (TRILEPTAL) 300 MG tablet     lamoTRIgine (LAMICTAL) 100 MG tablet

## 2020-12-22 NOTE — Telephone Encounter (Signed)
Left a message for the pt to return my call.  

## 2020-12-23 DIAGNOSIS — R2689 Other abnormalities of gait and mobility: Secondary | ICD-10-CM | POA: Diagnosis not present

## 2020-12-23 DIAGNOSIS — I10 Essential (primary) hypertension: Secondary | ICD-10-CM | POA: Diagnosis not present

## 2020-12-23 NOTE — Telephone Encounter (Signed)
I spoke with the pt and she stated that she had received a letter from our office that stated we have been trying to reach the pt in regards to her medications. Pt stated that she would like to only be contacted by phone or e-mail in regards to messages from PCP. Pt reported that she does not need anything done in regards to her medications.

## 2020-12-24 NOTE — Telephone Encounter (Signed)
Pt informed of the message below and verbalized understanding.

## 2020-12-24 NOTE — Telephone Encounter (Signed)
In follow up  from last months update    I see that she has seen the ENT doctor for the swallowing issue. We can discuss  about proceeding to modified swallowing study at your next appt.  In regard to medication  I had communicated with dr Jaynee Eagles. Previously  Cannot tell at this time  if the medication is  causing all your symptoms or just some of your symptoms .

## 2020-12-25 NOTE — Telephone Encounter (Signed)
ERROR

## 2020-12-28 ENCOUNTER — Other Ambulatory Visit: Payer: Self-pay

## 2020-12-28 MED ORDER — CARVEDILOL 25 MG PO TABS: ORAL_TABLET | ORAL | 3 refills | Status: DC

## 2021-01-01 ENCOUNTER — Emergency Department (HOSPITAL_COMMUNITY)
Admission: EM | Admit: 2021-01-01 | Discharge: 2021-01-01 | Disposition: A | Discharge status: Hospice | Payer: Medicare Other | Attending: Emergency Medicine | Admitting: Emergency Medicine

## 2021-01-01 ENCOUNTER — Emergency Department (HOSPITAL_COMMUNITY): Payer: Medicare Other

## 2021-01-01 ENCOUNTER — Encounter (HOSPITAL_COMMUNITY): Payer: Self-pay

## 2021-01-01 DIAGNOSIS — Y92480 Sidewalk as the place of occurrence of the external cause: Secondary | ICD-10-CM | POA: Diagnosis not present

## 2021-01-01 DIAGNOSIS — I10 Essential (primary) hypertension: Secondary | ICD-10-CM | POA: Insufficient documentation

## 2021-01-01 DIAGNOSIS — M791 Myalgia, unspecified site: Secondary | ICD-10-CM | POA: Insufficient documentation

## 2021-01-01 DIAGNOSIS — M7918 Myalgia, other site: Secondary | ICD-10-CM

## 2021-01-01 DIAGNOSIS — S0101XA Laceration without foreign body of scalp, initial encounter: Secondary | ICD-10-CM | POA: Diagnosis not present

## 2021-01-01 DIAGNOSIS — Z043 Encounter for examination and observation following other accident: Secondary | ICD-10-CM | POA: Diagnosis not present

## 2021-01-01 DIAGNOSIS — Z79899 Other long term (current) drug therapy: Secondary | ICD-10-CM | POA: Diagnosis not present

## 2021-01-01 DIAGNOSIS — M549 Dorsalgia, unspecified: Secondary | ICD-10-CM | POA: Insufficient documentation

## 2021-01-01 DIAGNOSIS — Z8616 Personal history of COVID-19: Secondary | ICD-10-CM | POA: Insufficient documentation

## 2021-01-01 DIAGNOSIS — R9082 White matter disease, unspecified: Secondary | ICD-10-CM | POA: Diagnosis not present

## 2021-01-01 DIAGNOSIS — M5030 Other cervical disc degeneration, unspecified cervical region: Secondary | ICD-10-CM | POA: Diagnosis not present

## 2021-01-01 DIAGNOSIS — W19XXXA Unspecified fall, initial encounter: Secondary | ICD-10-CM

## 2021-01-01 DIAGNOSIS — W01198A Fall on same level from slipping, tripping and stumbling with subsequent striking against other object, initial encounter: Secondary | ICD-10-CM | POA: Diagnosis not present

## 2021-01-01 DIAGNOSIS — S199XXA Unspecified injury of neck, initial encounter: Secondary | ICD-10-CM | POA: Diagnosis not present

## 2021-01-01 DIAGNOSIS — S0003XA Contusion of scalp, initial encounter: Secondary | ICD-10-CM | POA: Diagnosis not present

## 2021-01-01 DIAGNOSIS — S0990XA Unspecified injury of head, initial encounter: Secondary | ICD-10-CM | POA: Diagnosis present

## 2021-01-01 MED ORDER — HYDROMORPHONE HCL 1 MG/ML IJ SOLN
0.5000 mg | Freq: Once | INTRAMUSCULAR | Status: AC
  Administered 2021-01-01: 0.5 mg via INTRAMUSCULAR
  Filled 2021-01-01: qty 1

## 2021-01-01 MED ORDER — LIDOCAINE-EPINEPHRINE (PF) 2 %-1:200000 IJ SOLN
10.0000 mL | Freq: Once | INTRAMUSCULAR | Status: AC
  Administered 2021-01-01: 10 mL
  Filled 2021-01-01: qty 20

## 2021-01-01 MED ORDER — OXCARBAZEPINE 150 MG PO TABS
150.0000 mg | ORAL_TABLET | Freq: Once | ORAL | Status: AC
  Administered 2021-01-01: 150 mg via ORAL
  Filled 2021-01-01: qty 1

## 2021-01-01 MED ORDER — ONDANSETRON 4 MG PO TBDP
4.0000 mg | ORAL_TABLET | Freq: Once | ORAL | Status: AC
  Administered 2021-01-01: 4 mg via ORAL
  Filled 2021-01-01: qty 1

## 2021-01-01 NOTE — ED Notes (Signed)
Changed bandage on patients head. Bleeding is controled. Patient given scrub pants to wear home. Patient wheeled out in wheelchair and husband taking patient home. Patient states she is able to get out the car on her own when she gets home. Patient verbalizes understanding of discharge instructions.

## 2021-01-01 NOTE — ED Notes (Signed)
Abd bandage and kurlex bandage wrapped around her head and will monitor for bleeding.

## 2021-01-01 NOTE — ED Notes (Addendum)
Suture cart, tray, and lido at bedside. Lac wrapped with kurlex. Patient transported to radiology.

## 2021-01-01 NOTE — ED Provider Notes (Signed)
[facility] DEPT Provider Note   CSN: [id number] Arrival date & time: 01/01/21  1105     History No chief complaint on file.   [name] is a 80 y.o. female.  Patient presents to the emergency department today for evaluation of injury sustained after a fall.  Patient states that she was working to get out of a car using a cane.  She states that she was stepping up onto a sidewalk.  She like of the car and became unsteady, falling backwards to the ground.  She struck the back of her head but did not lose consciousness.  She reports dizziness and imbalance due to medication that she takes due to trigeminal neuralgia.  She complained of back pain while she was being moved around on the ambulance.  She sustained a laceration to the back of her head which "bled a lot".  Patient denies anticoagulants.  She has been awake and alert without vomiting or confusion.  C-collar placed by EMS as a precaution.  No chest pain or shortness of breath.  No abdominal pain.      Past Medical History:  Diagnosis Date   Abdominal pain 05/29/2013   s/p rx of cephalo resistant e coli   but last rx NG  now residular ?  bladder sx repeat cx sx rx to ty and uro consult    ADJ DISORDER WITH MIXED ANXIETY & DEPRESSED MOOD 03/03/2010   Qualifier: Diagnosis of  By: [name], [name]    Agent resistant to multiple antibiotics 05/29/2013   e coli   bu NG on fu.     ARF (acute renal failure) ([place]) 03/12/2015   Closed head injury 02/01/2011   from syncope and had scalp laceration  neg ct .     Closed head injury 5-6 yrs ago   Colitis 10/29/319   Complication of anesthesia    migraine several hours after general anesthesia   Fatty liver    Gall stones 2016   see ct scan neg HIDA    GERD (gastroesophageal reflux disease)    Hearing aid worn    HOH (hard of hearing)    both ears   Hyperlipidemia    Hypertension    echo nl lv function  mild dilitation 2009   Kidney infection     few yrs ago in hospital   Medication side effect 09/02/2010   Poss muscle se of 10 crestor    Migraine    hypnic HA eval by Dr. 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    Pyelonephritis 03/12/2015   Sensation of pain in anesthetized distribution of trigeminal nerve    Syncope 02/01/2011   In shower on vacation  sustained head laceration  8 sutures Had ed visit neg head ct labs and x ray    Trigeminal neuralgia pain     Patient Active Problem List   Diagnosis Date Noted   Lab test positive for detection of COVID-19 virus 05/25/2020   Abdominal pain 05/16/2020   Hematoma of abdominal wall, initial encounter 05/16/2020   Spondylosis without myelopathy or radiculopathy, lumbar region 03/27/2019   Radiculopathy due to lumbar intervertebral disc disorder 03/27/2019   Spinal stenosis of lumbar region with neurogenic claudication 03/27/2019   Diarrhea 11/27/2017   Unilateral headache 05/26/2015   Gallstones 03/12/2015   Nausea without vomiting 12/10/2014   Essential hypertension 05/29/2014   Post-traumatic headache 09/19/2013  Low back pain radiating to right leg 01/16/2013   Sinus problem 11/20/2012   Chronic headaches morning 10/19/2012   Low sodium levels  133 10/19/2012   Leg pain, posterior 08/16/2012   Sciatic neuritis 08/16/2012   Sleep disturbance, unspecified 08/16/2012   Medication management 03/24/2012   Preventative health care 09/24/2011   Trigeminal neuralgia 09/24/2011   Postmenopausal HRT (hormone replacement therapy) 09/24/2011   Polycythemia 09/20/2011   Morning headache 02/01/2011   Hearing aid worn    Abnormal blood finding    LOCALIZED SUPERFICIAL SWELLING MASS OR LUMP 09/21/2009   DYSPNEA 05/07/2008   Hypnic headache 03/19/2008   Cholelithiasis with chronic cholecystitis 03/19/2008   Headache(784.0) 06/05/2007   JAW PAIN 04/06/2007   Osteoarthritis 04/06/2007   ABNORMAL RESULT, FUNCTION STUDY, [place] 02/05/2007    HYPERLIPIDEMIA 12/25/2006   HYPERTENSION 12/25/2006    Past Surgical History:  Procedure Laterality Date   ABDOMINAL HYSTERECTOMY  2002   tubal   CARDIAC CATHETERIZATION  2000   chest pains neg   CHOLECYSTECTOMY N/A 02/21/2017   Procedure: LAPAROSCOPIC CHOLECYSTECTOMY WITH INTRAOPERATIVE CHOLANGIOGRAM;  Surgeon: Armandina Gemma, MD;  Location: [facility];  Service: General;  Laterality: N/A;   COLONOSCOPY     multiple   CRANIOTOMY  12/09/2011   nerve decompression right trigeminal    DOPPLER ECHOCARDIOGRAPHY  2009   nl lv function mild lv dilitation   EYE SURGERY Bilateral    ioc for catatracts   laparoscopic gallbladder surgery  02/16/2017   Fax from [place] Bilateral 2002   rt shoulder surgery       OB History     Gravida  2   Para  2   Term      Preterm      AB      Living         SAB      IAB      Ectopic      Multiple      Live Births              Family History  Problem Relation Age of Onset   Ovarian cancer Mother    Stroke Mother    Alcohol abuse Father    Stroke Father    Diabetes Brother    Cancer Paternal Aunt        leukemia, unknown type   Seizures Daughter    Hypertension Other    Colon cancer Neg Hx    Dementia Neg Hx    Alzheimer's disease Neg Hx     Social History   Tobacco Use   Smoking status: Never   Smokeless tobacco: Never  Vaping Use   Vaping Use: Never used  Substance Use Topics   Alcohol use: Not Currently    Alcohol/week: 2.0 standard drinks    Types: 2 Glasses of wine per week    Comment: occ wine   Drug use: No    Home Medications Prior to Admission medications   Medication Sig Start Date End Date Taking? Authorizing Provider  Acetaminophen (TYLENOL PO) Take 2 tablets by mouth as needed.    [provider]  alendronate (FOSAMAX) 70 MG tablet TAKE 1 TABLET BY MOUTH 1 TIME A WEEK WITH FULL GLASS OF WATER AND ON AN EMPTY STOMACH AS DIRECTED 10/26/20   [name], [name], MD   benazepril (LOTENSIN) 20 MG tablet Take 1 tablet (20 mg total) by mouth 2 (two) times daily. 11/02/20   [name], Mariann Laster  K, MD  butalbital-acetaminophen-caffeine (FIORICET WITH CODEINE) 50-325-40-30 MG capsule Take 1 capsule by mouth every 4 (four) hours as needed for headache. Rescue 04/22/20   [name], [name], MD  carvedilol (COREG) 25 MG tablet TAKE 1 TABLET(25 MG) BY MOUTH TWICE DAILY WITH A MEAL 12/28/20   [name], [name], MD  conjugated estrogens (PREMARIN) vaginal cream 2 (two) times a week.    [provider]  fluticasone (FLONASE) 50 MCG/ACT nasal spray Place 2 sprays into both nostrils as needed for allergies or rhinitis. 02/11/20   [name], [name], MD  furosemide (LASIX) 40 MG tablet Take 1 tablet (40 mg total) by mouth daily. 11/17/20   [name], [name], MD  lamoTRIgine (LAMICTAL) 100 MG tablet Take 2 pills in the morning(267m) and evening. May take an additional 1/2 pill(560m if needed with the morning and evening doses 10/20/20   AhMelvenia BeamMD  Multiple Vitamin (MULTIVITAMIN ADULT PO) Take 1 tablet by mouth.    [provider]  omeprazole (PRILOSEC) 20 MG capsule TAKE 1 CAPSULE(20 MG) BY MOUTH DAILY Patient taking differently: Take 20 mg by mouth 2 (two) times a week. 07/16/20   [name], WaStandley BrookingMD  ondansetron (ZOFRAN-ODT) 4 MG disintegrating tablet Take 1-2 tablets (4-8 mg total) by mouth every 8 (eight) hours as needed for nausea. 09/04/19   AhMelvenia BeamMD  OVER THE COUNTER MEDICATION OTC eye drops    [provider]  Oxcarbazepine (TRILEPTAL) 300 MG tablet Take 0.5 tablets (150 mg total) by mouth 2 (two) times daily. 11/25/20   AhMelvenia BeamMD    Allergies    Elemental sulfur and Sulfamethoxazole-trimethoprim  Review of Systems   Review of Systems  Constitutional:  Negative for fatigue.  HENT:  Negative for tinnitus.   Eyes:  Negative for photophobia, pain and visual disturbance.  Respiratory:  Negative for shortness of breath.    Cardiovascular:  Negative for chest pain.  Gastrointestinal:  Negative for nausea and vomiting.  Musculoskeletal:  Positive for back pain. Negative for gait problem and neck pain.  Skin:  Positive for wound.  Neurological:  Positive for headaches. Negative for dizziness, weakness, light-headedness and numbness.  Psychiatric/Behavioral:  Negative for confusion and decreased concentration.    Physical Exam Updated Vital Signs BP (!) 176/94 (BP Location: Left Arm)   Pulse 66   Temp 97.6 F (36.4 C) (Oral)   Resp 15   Ht 5' 6"  (1.676 m)   Wt 54.4 kg   SpO2 100%   BMI 19.37 kg/m   Physical Exam Vitals and nursing note reviewed.  Constitutional:      Appearance: She is well-developed.  HENT:     Head: Normocephalic. No raccoon eyes or Battle's sign.     Comments: Patient with occipital hematoma and laceration.  She is very tender with palpation around this area.    Right Ear: Tympanic membrane, ear canal and external ear normal. No hemotympanum.     Left Ear: Tympanic membrane, ear canal and external ear normal. No hemotympanum.     Nose: Nose normal.     Mouth/Throat:     Pharynx: Uvula midline.  Eyes:     General: Lids are normal.     Extraocular Movements:     Right eye: No nystagmus.     Left eye: No nystagmus.     Conjunctiva/sclera: Conjunctivae normal.     Pupils: Pupils are equal, round, and reactive to light.     Comments: No visible  hyphema noted  Cardiovascular:     Rate and Rhythm: Normal rate and regular rhythm.  Pulmonary:     Effort: Pulmonary effort is normal.     Breath sounds: Normal breath sounds.  Abdominal:     Palpations: Abdomen is soft.     Tenderness: There is no abdominal tenderness.  Musculoskeletal:     Cervical back: Normal range of motion and neck supple. No tenderness or bony tenderness.     Thoracic back: No tenderness or bony tenderness.     Lumbar back: No tenderness or bony tenderness.     Comments: I am able to passively range all  extremities.  Patient does report pain in the posterior right thigh, and near the buttocks, with movement of her right leg only.  No significant cervical or thoracic spine tenderness.  Patient with mild, poorly localized, lumbar tenderness.  Skin:    General: Skin is warm and dry.  Neurological:     Mental Status: She is alert and oriented to person, place, and time.     GCS: GCS eye subscore is 4. GCS verbal subscore is 5. GCS motor subscore is 6.     Cranial Nerves: No cranial nerve deficit.     Sensory: No sensory deficit.     Coordination: Coordination normal.     Deep Tendon Reflexes: Reflexes are normal and symmetric.    ED Results / Procedures / Treatments   Labs (all labs ordered are listed, but only abnormal results are displayed) Labs Reviewed - No data to display  EKG None  Radiology DG Lumbar Spine Complete  Result Date: 01/01/2021 CLINICAL DATA:  Fall EXAM: LUMBAR SPINE - COMPLETE 4+ VIEW COMPARISON:  11/27/2019 FINDINGS: Negative for fracture or mass. Mild anterolisthesis L4-5 with mild disc degeneration. Mild disc degeneration also at L2-3. IMPRESSION: Negative for fracture. Electronically Signed   By: Franchot Gallo M.D.   On: 01/01/2021 12:25   DG Pelvis 1-2 Views  Result Date: 01/01/2021 CLINICAL DATA:  Fall EXAM: PELVIS - 1-2 VIEW COMPARISON:  None. FINDINGS: There is no evidence of pelvic fracture or diastasis. No pelvic bone lesions are seen. IMPRESSION: Negative. Electronically Signed   By: Franchot Gallo M.D.   On: 01/01/2021 12:24   CT HEAD WO CONTRAST (5MM)  Result Date: 01/01/2021 CLINICAL DATA:  Fall EXAM: CT HEAD WITHOUT CONTRAST CT CERVICAL SPINE WITHOUT CONTRAST TECHNIQUE: Multidetector CT imaging of the head and cervical spine was performed following the standard protocol without intravenous contrast. Multiplanar CT image reconstructions of the cervical spine were also generated. COMPARISON:  MR brain and cervical spine, 07/08/2020 FINDINGS: CT HEAD  FINDINGS Brain: No evidence of acute infarction, hemorrhage, hydrocephalus, extra-axial collection or mass lesion/mass effect. Mild periventricular white matter hypodensity. Vascular: No hyperdense vessel or unexpected calcification. Skull: Evidence of prior right occipital craniotomy. Negative for fracture or focal lesion. Sinuses/Orbits: No acute finding. Other: Soft tissue contusion hematoma of the right scalp vertex (series 3, image 22). CT CERVICAL SPINE FINDINGS Alignment: Degenerative straightening of the normal cervical lordosis. Skull base and vertebrae: No acute fracture. No primary bone lesion or focal pathologic process. Soft tissues and spinal canal: No prevertebral fluid or swelling. No visible canal hematoma. Disc levels: Moderate disc space height loss and osteophytosis of the lower cervical spine. Upper chest: Negative. Other: None. IMPRESSION: 1. No acute intracranial pathology. Small-vessel white matter disease. 2. Evidence of prior right occipital craniotomy. 3. Soft tissue contusion and hematoma of the right scalp vertex. 4. No fracture or  static subluxation of the cervical spine. 5. 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 Electronically Signed   By: Eddie Candle M.D.   On: 01/01/2021 12:50   CT Cervical Spine Wo Contrast  Result Date: 01/01/2021 CLINICAL DATA:  Fall EXAM: CT HEAD WITHOUT CONTRAST CT CERVICAL SPINE WITHOUT CONTRAST TECHNIQUE: Multidetector CT imaging of the head and cervical spine was performed following the standard protocol without intravenous contrast. Multiplanar CT image reconstructions of the cervical spine were also generated. COMPARISON:  MR brain and cervical spine, 07/08/2020 FINDINGS: CT HEAD FINDINGS Brain: No evidence of acute infarction, hemorrhage, hydrocephalus, extra-axial collection or mass lesion/mass effect. Mild periventricular white matter hypodensity. Vascular: No hyperdense vessel or unexpected  calcification. Skull: Evidence of prior right occipital craniotomy. Negative for fracture or focal lesion. Sinuses/Orbits: No acute finding. Other: Soft tissue contusion hematoma of the right scalp vertex (series 3, image 22). CT CERVICAL SPINE FINDINGS Alignment: Degenerative straightening of the normal cervical lordosis. Skull base and vertebrae: No acute fracture. No primary bone lesion or focal pathologic process. Soft tissues and spinal canal: No prevertebral fluid or swelling. No visible canal hematoma. Disc levels: Moderate disc space height loss and osteophytosis of the lower cervical spine. Upper chest: Negative. Other: None. IMPRESSION: 1. No acute intracranial pathology. Small-vessel white matter disease. 2. Evidence of prior right occipital craniotomy. 3. Soft tissue contusion and hematoma of the right scalp vertex. 4. No fracture or static subluxation of the cervical spine. 5. 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 Electronically Signed   By: Eddie Candle M.D.   On: 01/01/2021 12:50    Procedures .Marland KitchenLaceration Repair  Date/Time: 01/01/2021 2:00 PM Performed by: Carlisle Cater, PA-C Authorized by: Carlisle Cater, PA-C   Consent:    Consent obtained:  Verbal   Consent given by:  Patient   Risks discussed:  Infection and pain (bleeding)   Alternatives discussed:  No treatment Pre-procedure details:    Preparation:  Imaging obtained to evaluate for foreign bodies Exploration:    Imaging outcome: foreign body not noted     Wound extent: vascular damage   Skin repair:    Repair method:  Sutures   Suture size:  5-0   Suture material:  Nylon   Suture technique:  Figure eight   Number of sutures:  1 Approximation:    Approximation:  Close Repair type:    Repair type:  Simple Post-procedure details:    Procedure completion:  Tolerated well, no immediate complications   Medications Ordered in ED Medications - No data to display  ED  Course  I have reviewed the triage vital signs and the nursing notes.  Pertinent labs & imaging results that were available during my care of the patient were reviewed by me and considered in my medical decision making (see chart for details).  Patient seen and examined.  Per patient history this was a mechanical fall.  Will need imaging of the head and neck, lower back and pelvis.  Wound will need cleaned and repaired.  Vital signs reviewed and are as follows: BP (!) 176/94 (BP Location: Left Arm)   Pulse 66   Temp 97.6 F (36.4 C) (Oral)   Resp 15   Ht 5' 6"  (1.676 m)   Wt 54.4 kg   SpO2 100%   BMI 19.37 kg/m   CT imaging results reviewed.  Negative for fracture or intracranial bleeding.  C-collar removed by herself.   Wound was cleaned  with dermal cleanser.  Patient had resumed arteriolar bleed.  This was injected with lidocaine with epinephrine.  I placed 1 Z stitch which slowed bleeding.  Pressure was then applied with improvement but not complete resolution.  Placed pressure bandage and will reassess.  2:16 PM Bandage placed. Will reassess.   3:04 PM No bleedthrough noted.  Patient complains of pain in the tailbone area.  Requesting pain medication prior to discharge.  IM dilaudid 0.28m, ODT Zofran ordered.  Patient has medications at home.  Patient counseled on wound care. Patient counseled on need to return or see PCP/urgent care for suture removal in 7-10 days. Patient was urged to return to the Emergency Department urgently with worsening pain, swelling, expanding erythema especially if it streaks away from the affected area, fever, or if they have any other concerns. Patient verbalized understanding.    MDM Rules/Calculators/A&P                           Patient with injuries after mechanical fall today.  She has a posterior scalp laceration and hematoma.  CT imaging of the head and cervical spine were negative.  X-ray of the pelvis and lower back were negative.  Bleeding  controlled.  Plan for discharged home.  Final Clinical Impression(s) / ED Diagnoses Final diagnoses:  Fall  Laceration of occipital scalp, initial encounter  Musculoskeletal pain    Rx / DC Orders ED Discharge Orders     None        GCarlisle Cater PA-C 01/01/21 1506    GSherwood Gambler MD 01/04/21 [id number]

## 2021-01-01 NOTE — ED Triage Notes (Addendum)
Patient arrived [facility]. Patient became dizzy and fell. Patient take medication for neurological jaw pain and her medication is known to cause dizziness and blurry vision routinely.   Lac and contusion to back of head.  C/o lower central back pain Patient was unable to sit up due to pain.   No LOC A/ox4 Denies blood thinners.   Hx: Trigeminal neuralgia

## 2021-01-01 NOTE — Discharge Instructions (Signed)
Please read and follow all provided instructions.  Your diagnoses today include:  1. Fall   2. Laceration of occipital scalp, initial encounter   3. Musculoskeletal pain     Tests performed today include: CT scan of your head and neck that did not show any serious injury. X-ray of your pelvic and lower back - no broken bones seen Vital signs. See below for your results today.   Medications prescribed:  None  Take any prescribed medications only as directed.  Home care instructions:  Follow any educational materials contained in this packet.  BE VERY CAREFUL not to take multiple medicines containing Tylenol (also called acetaminophen). Doing so can lead to an overdose which can damage your liver and cause liver failure and possibly death.   Follow-up instructions: Please see your doctor in 7 to 10 days for removal of suture.  Please note that this is a figure-of-eight suture placed on the scalp with a long tail.  Return instructions:  SEEK IMMEDIATE MEDICAL ATTENTION IF: There is confusion or drowsiness (although children frequently become drowsy after injury).  You cannot awaken the injured person.  You have more than one episode of vomiting.  You notice dizziness or unsteadiness which is getting worse, or inability to walk.  You have convulsions or unconsciousness.  You experience severe, persistent headaches not relieved by Tylenol. You cannot use arms or legs normally.  There are changes in pupil sizes. (This is the black center in the colored part of the eye)  There is clear or bloody discharge from the nose or ears.  You have change in speech, vision, swallowing, or understanding.  Localized weakness, numbness, tingling, or change in bowel or bladder control. You have any other emergent concerns.  Additional Information: You have had a head injury which does not appear to require admission at this time.  Your vital signs today were: BP (!) 198/91   Pulse (!) 58    Temp 97.6 F (36.4 C) (Oral)   Resp 18   Ht 5' 6"  (1.676 m)   Wt 54.4 kg   SpO2 98%   BMI 19.37 kg/m  If your blood pressure (BP) was elevated above 135/85 this visit, please have this repeated by your doctor within one month. --------------

## 2021-01-04 ENCOUNTER — Telehealth: Payer: Self-pay | Admitting: Adult Health

## 2021-01-04 NOTE — Telephone Encounter (Signed)
Spoke with the patient and advised that she should speak with Dr. Arlan Organ tomorrow to see how long her recovery time is estimated. It can take months per Dr Jaynee Eagles. We are not sure how long she should wait. Pt verbalized understanding and appreciation. She will ask Dr Arlan Organ tomorrow.

## 2021-01-04 NOTE — Telephone Encounter (Signed)
Pt called, she states she is scheduled for her procedure tomorrow, however the doctor told her he would not do a follow up appt with her. Pt is wanting to know would she need to follow up with [place]. Pt requesting call back.

## 2021-01-05 DIAGNOSIS — G500 Trigeminal neuralgia: Secondary | ICD-10-CM | POA: Diagnosis not present

## 2021-01-05 DIAGNOSIS — Z510 Encounter for antineoplastic radiation therapy: Secondary | ICD-10-CM | POA: Diagnosis not present

## 2021-01-11 ENCOUNTER — Encounter: Payer: Self-pay | Admitting: Internal Medicine

## 2021-01-11 ENCOUNTER — Other Ambulatory Visit: Payer: Self-pay

## 2021-01-11 ENCOUNTER — Ambulatory Visit: Payer: Medicare Other | Admitting: Internal Medicine

## 2021-01-11 VITALS — BP 116/60 | HR 65 | Temp 98.3°F | Ht 66.0 in | Wt 124.6 lb

## 2021-01-11 DIAGNOSIS — Z8616 Personal history of COVID-19: Secondary | ICD-10-CM

## 2021-01-11 DIAGNOSIS — Z79899 Other long term (current) drug therapy: Secondary | ICD-10-CM | POA: Diagnosis not present

## 2021-01-11 DIAGNOSIS — Z9181 History of falling: Secondary | ICD-10-CM

## 2021-01-11 DIAGNOSIS — G500 Trigeminal neuralgia: Secondary | ICD-10-CM

## 2021-01-11 DIAGNOSIS — R1310 Dysphagia, unspecified: Secondary | ICD-10-CM | POA: Diagnosis not present

## 2021-01-11 DIAGNOSIS — I10 Essential (primary) hypertension: Secondary | ICD-10-CM

## 2021-01-11 DIAGNOSIS — R1312 Dysphagia, oropharyngeal phase: Secondary | ICD-10-CM

## 2021-01-11 DIAGNOSIS — R2689 Other abnormalities of gait and mobility: Secondary | ICD-10-CM

## 2021-01-11 DIAGNOSIS — Z4802 Encounter for removal of sutures: Secondary | ICD-10-CM

## 2021-01-11 NOTE — Patient Instructions (Addendum)
Stay off of the fosamax. For now We will get   order for  swallowing study  I agree that meds may be contributing to the dizzy spells and other sx . Such as memory and fogginess.   Will keep communication with neurology about this.   Bp is good today no changes .  Keep using the walker especially after taking your medication at risk for dizziness. Plan fu depending .

## 2021-01-11 NOTE — Progress Notes (Signed)
Chief Complaint  Patient presents with   Medication Consultation    HPI: [name] 80 y.o. with a number of  recurrant conditions  and hx of frequent falls   comes intoday Had procedure for  [name] right  on 8 16  Had  swalloing issue   uncertain if med related   still has an issue  had ent eval  neg .  Phsyical exam.  [name] requiring sutures on 812 has 1 stitch in back of head can we take it out [place] BP seems to be okay swelling in feet is about the same She reports that she will get dizzy and foggy soon after she takes her morning medicines which are Lamictal 200 mg and Trileptal 75 mg.  In the evening she gets it slightly but nowhere near as bad Some dizzy with medicatoin.  Feels like she will fall and then the symptoms go away.  And then goes away.   Swallowing feeling continues although her weight has stabilized he has stopped the Fosamax temporarily as I requested.  States her brain is somewhat foggy especially worse since having COVID and will get difficulty writing will reverse letters for example. ROS: See pertinent positives and negatives per HPI.  Past Medical History:  Diagnosis Date   Abdominal pain 05/29/2013   s/p rx of cephalo resistant e coli   but last rx NG  now residular ?  bladder sx repeat cx sx rx to ty and uro consult    ADJ DISORDER WITH MIXED ANXIETY & DEPRESSED MOOD 03/03/2010   Qualifier: Diagnosis of  By: [name], [name]    Agent resistant to multiple antibiotics 05/29/2013   e coli   bu NG on fu.     ARF (acute renal failure) ([place]) 03/12/2015   Closed head injury 02/01/2011   from syncope and had scalp laceration  neg ct .     Closed head injury 5-6 yrs ago   Colitis 8/0/8811   Complication of anesthesia    migraine several hours after general anesthesia   Fatty liver    Gall stones 2016   see ct scan neg HIDA    GERD (gastroesophageal reflux disease)    Hearing aid worn    HOH (hard of hearing)    both ears   Hyperlipidemia     Hypertension    echo nl lv function  mild dilitation 2009   Kidney infection    few yrs ago in hospital   Medication side effect 09/02/2010   Poss muscle se of 10 crestor    Migraine    hypnic HA eval by Dr. 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    Pyelonephritis 03/12/2015   Sensation of pain in anesthetized distribution of trigeminal nerve    Syncope 02/01/2011   In shower on vacation  sustained head laceration  8 sutures Had ed visit neg head ct labs and x ray    Trigeminal neuralgia pain     Family History  Problem Relation Age of Onset   Ovarian cancer Mother    Stroke Mother    Alcohol abuse Father    Stroke Father    Diabetes Brother    Cancer Paternal Aunt        leukemia, unknown type   Seizures Daughter    Hypertension Other    Colon cancer Neg Hx    Dementia Neg Hx    Alzheimer's  disease Neg Hx     Social History   Socioeconomic History   Marital status: Married    Spouse name: Not on file   Number of children: 2   Years of education: Not on file   Highest education level: Not on file  Occupational History    Comment: retired Forensic psychologist  Tobacco Use   Smoking status: Never   Smokeless tobacco: Never  Vaping Use   Vaping Use: Never used  Substance and Sexual Activity   Alcohol use: Not Currently    Alcohol/week: 2.0 standard drinks    Types: 2 Glasses of wine per week    Comment: occ wine   Drug use: No   Sexual activity: Not on file  Other Topics Concern   Not on file  Social History Narrative   Married   HH of 2-3 (god daughter)   Pets 2 dogs   Non smoker    Child is a Engineer, drilling   G2P2      Caffeine: 2 cups/day   Social Determinants of Radio broadcast assistant Strain: Low Risk    Difficulty of Paying Living Expenses: Not hard at all  Food Insecurity: No Food Insecurity   Worried About Charity fundraiser in the Last Year: Never true   [place] in the Last Year: Never true  Transportation  Needs: No Transportation Needs   Lack of Transportation (Medical): No   Lack of Transportation (Non-Medical): No  Physical Activity: Inactive   Days of Exercise per Week: 0 days   Minutes of Exercise per Session: 0 min  Stress: No Stress Concern Present   Feeling of Stress : Not at all  Social Connections: Moderately Integrated   Frequency of Communication with Friends and Family: More than three times a week   Frequency of Social Gatherings with Friends and Family: Once a week   Attends Religious Services: 1 to 4 times per year   Active Member of [employer] or Organizations: No   Attends Archivist Meetings: Never   Marital Status: Married    Outpatient Medications Prior to Visit  Medication Sig Dispense Refill   Acetaminophen (TYLENOL PO) Take 2 tablets by mouth as needed.     alendronate (FOSAMAX) 70 MG tablet TAKE 1 TABLET BY MOUTH 1 TIME A WEEK WITH FULL GLASS OF WATER AND ON AN EMPTY STOMACH AS DIRECTED 4 tablet 2   benazepril (LOTENSIN) 20 MG tablet Take 1 tablet (20 mg total) by mouth 2 (two) times daily. 180 tablet 1   butalbital-acetaminophen-caffeine (FIORICET WITH CODEINE) 50-325-40-30 MG capsule Take 1 capsule by mouth every 4 (four) hours as needed for headache. Rescue 30 capsule 0   carvedilol (COREG) 25 MG tablet TAKE 1 TABLET(25 MG) BY MOUTH TWICE DAILY WITH A MEAL 60 tablet 3   conjugated estrogens (PREMARIN) vaginal cream 2 (two) times a week.     fluticasone (FLONASE) 50 MCG/ACT nasal spray Place 2 sprays into both nostrils as needed for allergies or rhinitis. 16 g 5   furosemide (LASIX) 40 MG tablet Take 1 tablet (40 mg total) by mouth daily. 30 tablet 3   lamoTRIgine (LAMICTAL) 100 MG tablet Take 2 pills in the morning(247m) and evening. May take an additional 1/2 pill(551m if needed with the morning and evening doses 150 tablet 11   Multiple Vitamin (MULTIVITAMIN ADULT PO) Take 1 tablet by mouth.     omeprazole (PRILOSEC) 20 MG capsule TAKE 1 CAPSULE(20 MG)  BY  MOUTH DAILY (Patient taking differently: Take 20 mg by mouth 2 (two) times a week.) 30 capsule 0   ondansetron (ZOFRAN-ODT) 4 MG disintegrating tablet Take 1-2 tablets (4-8 mg total) by mouth every 8 (eight) hours as needed for nausea. 60 tablet 3   OVER THE COUNTER MEDICATION OTC eye drops     Oxcarbazepine (TRILEPTAL) 300 MG tablet Take 0.5 tablets (150 mg total) by mouth 2 (two) times daily. 30 tablet 5   No facility-administered medications prior to visit.     EXAM:  BP 116/60 (BP Location: Left Arm, Patient Position: Sitting, Cuff Size: Normal)   Pulse 65   Temp 98.3 F (36.8 C) (Oral)   Ht 5' 6"  (1.676 m)   Wt 124 lb 9.6 oz (56.5 kg)   SpO2 94%   BMI 20.11 kg/m   Body mass index is 20.11 kg/m.  GENERAL: vitals reviewed and listed above, alert, oriented, appears well hydrated and in no acute distress HEENT: Top of head small hematoma 1 stitch removed no active bleeding or open lesion.  Conjunctiva  clear, no obvious abnormalities on inspection of external nose and ears OP : masked   NECK: no obvious masses on inspection palpation  LUNGS: clear to auscultation bilaterally, no wheezes, rales or rhonchi, good air movement CV: HRRR, no clubbing cyanosis or  peripheral edema nl cap refill  MS: moves all extremities without noticeable focal  abnormality has a roller walker but has independent gait with walker is very steady movement etc. gets up slowly from chair to stand but stable. PSYCH: pleasant and cooperative, no obvious depression or anxiety speech appears normal. Lab Results  Component Value Date   WBC 6.8 11/26/2020   HGB 14.1 11/26/2020   HCT 44.3 11/26/2020   PLT 222 11/26/2020   GLUCOSE 100 (H) 11/26/2020   CHOL 279 (H) 05/27/2019   TRIG 78.0 05/27/2019   HDL 118.80 05/27/2019   LDLDIRECT 135.9 10/16/2012   LDLCALC 145 (H) 05/27/2019   ALT 15 11/26/2020   AST 15 11/26/2020   NA 142 11/26/2020   K 4.5 11/26/2020   CL 97 11/26/2020   CREATININE 1.11 (H)  11/26/2020   BUN 20 11/26/2020   CO2 30 (H) 11/26/2020   TSH 0.88 07/01/2020   INR 1.1 05/16/2020   HGBA1C 5.5 05/25/2018   BP Readings from Last 3 Encounters:  01/11/21 116/60  01/01/21 (!) 184/76  11/10/20 136/82   Wt Readings from Last 3 Encounters:  01/11/21 124 lb 9.6 oz (56.5 kg)  01/01/21 120 lb (54.4 kg)  11/10/20 123 lb 3.2 oz (55.9 kg)     ASSESSMENT AND PLAN:  Discussed the following assessment and plan:  Swallowing problem - Plan: SLP clinical swallow evaluation  Dysphagia, oropharyngeal phase - Plan: SLP clinical swallow evaluation  Medication management - Plan: SLP clinical swallow evaluation  Trigeminal neuralgia  Balance problem  History of falling  Encounter for removal of sutures - Placed in ED  History of 2019 novel coronavirus disease (COVID-19)  Essential hypertension Continued wallowing difficulty but no choking.  See ENT evaluation order speech swallowing study. Patient relates the symptoms to her medication which is possible  Risk of falling appears to be aggravated soon after she takes her medication in the morning and less so in the evening. Hopefully after procedure she will be able to reduce these medications to rule out side effects that are causing her other problems. After evaluation we may get her back on Fosamax if not try other intervention  Reclast or other. Record review ED review evaluation plan follow-up 45 minutes -Patient advised to return or notify health care team  if  new concerns arise.  Patient Instructions  Stay off of the fosamax. For now We will get   order for  swallowing study  I agree that meds may be contributing to the dizzy spells and other sx . Such as memory and fogginess.   Will keep communication with neurology about this.   Bp is good today no changes .  Keep using the walker especially after taking your medication at risk for dizziness. Plan fu depending .    [name]. Tyeson Tanimoto M.D.

## 2021-01-12 ENCOUNTER — Other Ambulatory Visit: Payer: Self-pay

## 2021-01-12 MED ORDER — OMEPRAZOLE 20 MG PO CPDR: 20.0000 mg | DELAYED_RELEASE_CAPSULE | Freq: Every day | ORAL | 2 refills | Status: DC

## 2021-01-15 ENCOUNTER — Other Ambulatory Visit: Payer: Self-pay

## 2021-01-15 DIAGNOSIS — R1312 Dysphagia, oropharyngeal phase: Secondary | ICD-10-CM

## 2021-01-15 DIAGNOSIS — Z79899 Other long term (current) drug therapy: Secondary | ICD-10-CM

## 2021-01-15 NOTE — Addendum Note (Signed)
Addended by: Nilda Riggs on: 01/15/2021 03:37 PM   Modules accepted: Orders

## 2021-01-18 ENCOUNTER — Ambulatory Visit: Payer: Medicare Other | Admitting: Internal Medicine

## 2021-01-18 ENCOUNTER — Other Ambulatory Visit: Payer: Self-pay | Admitting: Internal Medicine

## 2021-01-18 DIAGNOSIS — R1312 Dysphagia, oropharyngeal phase: Secondary | ICD-10-CM

## 2021-01-18 DIAGNOSIS — R1310 Dysphagia, unspecified: Secondary | ICD-10-CM

## 2021-01-19 ENCOUNTER — Other Ambulatory Visit: Payer: Self-pay

## 2021-01-19 ENCOUNTER — Telehealth: Payer: Self-pay | Admitting: Neurology

## 2021-01-19 ENCOUNTER — Telehealth: Payer: Self-pay

## 2021-01-19 NOTE — Telephone Encounter (Signed)
I called patient and also spoke to her husband.  Patient had gamma knife surgery it was successful.  Her pain is gradually reducing.  She has 2 small shocks per day right now.  The plan is when she has no pain for 5 days they will start tapering the medication or following Dr. Salem Senate instructions.  They will still get refills from [place] but the tapering it sounds like will be done by Dr. Arlan Organ.  I relayed that Dr. Jaynee Eagles wanted [place] to call and check to see how she was doing and if she was doing well then we would start tapering it but I relayed I will let Dr. Jaynee Eagles know of plan per Dr. Arlan Organ.    She has an appointment scheduled with Dr. Arlan Organ in 4 months which would be November 2022.  She is on Lamictal taking 250 mg twice a day.  And her ox carbamazepine she is taking 150 mg at 8:00, 2 PM and 8:00pm.   That is a difference from our prescription dosing of BID.    The husband said he had increased it and added the 2:00p dose as again she was having pain.Marland Kitchen

## 2021-01-19 NOTE — Telephone Encounter (Signed)
Patient has trigeminal neuralgia, she has been on a high dose of Lamictal which was giving her side effects. She recently had gamma knife on her trigeminal nerve, so if she is feeling better I would like to decrease the lamictal. Can you call and see how she is doing, what dose of lamictal se is taking and ask if she would consider decreasing it? Let me know and I can come up with a titration plan for her thanks

## 2021-01-21 DIAGNOSIS — C801 Malignant (primary) neoplasm, unspecified: Secondary | ICD-10-CM

## 2021-01-21 HISTORY — DX: Malignant (primary) neoplasm, unspecified: C80.1

## 2021-01-22 ENCOUNTER — Telehealth: Payer: Self-pay | Admitting: Adult Health

## 2021-01-22 ENCOUNTER — Ambulatory Visit (HOSPITAL_COMMUNITY)
Admission: RE | Admit: 2021-01-22 | Discharge: 2021-01-22 | Disposition: A | Discharge status: Home / Self Care | Payer: Medicare Other | Source: Ambulatory Visit | Attending: Internal Medicine | Admitting: Internal Medicine

## 2021-01-22 ENCOUNTER — Other Ambulatory Visit: Payer: Self-pay

## 2021-01-22 ENCOUNTER — Telehealth (HOSPITAL_COMMUNITY): Payer: Self-pay

## 2021-01-22 DIAGNOSIS — R1310 Dysphagia, unspecified: Secondary | ICD-10-CM

## 2021-01-22 DIAGNOSIS — Z79899 Other long term (current) drug therapy: Secondary | ICD-10-CM | POA: Diagnosis not present

## 2021-01-22 DIAGNOSIS — R1312 Dysphagia, oropharyngeal phase: Secondary | ICD-10-CM | POA: Diagnosis not present

## 2021-01-22 NOTE — Telephone Encounter (Signed)
Spoke to Dr. 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 He advised follow up with GI urgently.   Order placed

## 2021-01-22 NOTE — Telephone Encounter (Signed)
Attempted to contact patient to schedule OP MBS - left voicemail. ?

## 2021-01-25 ENCOUNTER — Other Ambulatory Visit: Payer: Self-pay | Admitting: Internal Medicine

## 2021-01-25 DIAGNOSIS — K229 Disease of esophagus, unspecified: Secondary | ICD-10-CM

## 2021-01-25 DIAGNOSIS — R1310 Dysphagia, unspecified: Secondary | ICD-10-CM

## 2021-01-25 NOTE — Progress Notes (Signed)
Esophagus shows upper narrowing that could cause swallowing difficulties .  I have placed a stat referral to Gi for evaluation.  [name]  please  make sure  this referral gets processed in a timely manner .

## 2021-01-25 NOTE — Progress Notes (Signed)
Gi referral placed

## 2021-01-25 NOTE — Telephone Encounter (Signed)
So I also placed an order.   Please follow up and make sure she has appt asap

## 2021-01-26 NOTE — Telephone Encounter (Signed)
I spoke with Marva from [facility] and the pt has app scheduled for 09/14 with Sela Hilding PA. Pt has been informed of this and verbalized understanding.

## 2021-01-27 ENCOUNTER — Telehealth: Payer: Self-pay | Admitting: Pharmacist

## 2021-01-27 NOTE — Chronic Care Management (AMB) (Signed)
    Chronic Care Management Pharmacy Assistant   Name: [name]  MRN: [id number] DOB: 1940/06/24  01-27-2021 APPOINTMENT REMINDER   Called [name], left message of appointment on 01-28-2021 at 10:30 via telephone visit with [name], Pharm D with husband. He was asked to Notify her to have all medications, supplements, blood pressure and/or blood sugar logs available during appointment and to return call if need to reschedule.  Care Gaps: COVID Booster #3 Therapist, music) - Overdue Zoster Vaccine - Overdue Flu Vaccine - Overdue  Star Rating Drug: Benazepril (Losentin) 20 mg - Last filled 12-14-2020 90 DS at [facility]   Any gaps in medications fill history? None  Medications: Outpatient Encounter Medications as of 01/27/2021  Medication Sig Note   Acetaminophen (TYLENOL PO) Take 2 tablets by mouth as needed. 05/16/2020: Used @ home PRN   alendronate (FOSAMAX) 70 MG tablet TAKE 1 TABLET BY MOUTH 1 TIME A WEEK WITH FULL GLASS OF WATER AND ON AN EMPTY STOMACH AS DIRECTED    benazepril (LOTENSIN) 20 MG tablet Take 1 tablet (20 mg total) by mouth 2 (two) times daily.    butalbital-acetaminophen-caffeine (FIORICET WITH CODEINE) 50-325-40-30 MG capsule Take 1 capsule by mouth every 4 (four) hours as needed for headache. Rescue 05/16/2020: Has @ home PRN   carvedilol (COREG) 25 MG tablet TAKE 1 TABLET(25 MG) BY MOUTH TWICE DAILY WITH A MEAL    conjugated estrogens (PREMARIN) vaginal cream 2 (two) times a week.    fluticasone (FLONASE) 50 MCG/ACT nasal spray Place 2 sprays into both nostrils as needed for allergies or rhinitis.    furosemide (LASIX) 40 MG tablet Take 1 tablet (40 mg total) by mouth daily.    lamoTRIgine (LAMICTAL) 100 MG tablet Take 2 pills in the morning(253m) and evening. May take an additional 1/2 pill(532m if needed with the morning and evening doses    Multiple Vitamin (MULTIVITAMIN ADULT PO) Take 1 tablet by mouth.    omeprazole (PRILOSEC) 20 MG  capsule Take 1 capsule (20 mg total) by mouth daily.    ondansetron (ZOFRAN-ODT) 4 MG disintegrating tablet Take 1-2 tablets (4-8 mg total) by mouth every 8 (eight) hours as needed for nausea. 05/16/2020: Has @ home PRN   OVER THE COUNTER MEDICATION OTC eye drops    Oxcarbazepine (TRILEPTAL) 300 MG tablet Take 0.5 tablets (150 mg total) by mouth 2 (two) times daily.    No facility-administered encounter medications on file as of 01/27/2021.   [place] Pharmacist Assistant [phone]

## 2021-01-28 ENCOUNTER — Ambulatory Visit (INDEPENDENT_AMBULATORY_CARE_PROVIDER_SITE_OTHER): Payer: Medicare Other | Admitting: Pharmacist

## 2021-01-28 DIAGNOSIS — I10 Essential (primary) hypertension: Secondary | ICD-10-CM

## 2021-01-28 DIAGNOSIS — M1990 Unspecified osteoarthritis, unspecified site: Secondary | ICD-10-CM

## 2021-01-28 NOTE — Progress Notes (Signed)
Chronic Care Management Pharmacy Note  01/29/2021 Name:  [name] MRN:  [id number] DOB:  [birth year]  Summary: Pt is still having difficulty swallowing Pt is not checking BP at home consistently   Recommendations/Changes made from today's visit: -Updated medication list to reflect three times daily for oxcarbazepine  -Recommend repeat vitamin D level given previous deficiency and repeat DEXA -Recommended checking BP at home at least weekly -Recommended switching to chewable calcium & vitamin D   Plan: BP assessment in 1 month   Subjective: [name] is an 80 y.o. year old female who is a primary patient of [name], [name], MD.  The CCM team was consulted for assistance with disease management and care coordination needs.    Engaged with patient by telephone for follow up visit in response to provider referral for pharmacy case management and/or care coordination services.   Consent to Services:  The patient was given information about Chronic Care Management services, agreed to services, and gave verbal consent prior to initiation of services.  Please see initial visit note for detailed documentation.   Patient Care Team: [name], [name], MD as PCP - General [name], MD as PCP - Cardiology (Cardiology) Cindie Crumbly (Neurosurgery) Irine Seal, MD as Attending Physician (Urology) Erline Levine, MD as Consulting Physician (Neurosurgery) [name], MD as Consulting Physician (Neurology) Viona Gilmore, [facility] as Pharmacist (Pharmacist)  Recent office visits: 01/11/21 [name], MD: Patient presented for swallowing problem. Plan for swallow evaluation.  Continue to hold Fosamax.  11/10/20 [name], MD: Patient presented for swallowing problem. Plan to hold Fosamax due to potential worsening.  08/17/20 [name], MD: Patient presented for edema. Increased furosemide to 40 mg daily.  07/24/20 Telephone encounter: Benazepril was  increased to 20 mg BID and clonidine d/c'd.  07/20/20 [name], MD: Patient presented for chronic conditions follow up.   07/01/20 [name], MD: Patient presented for change in memory.  Recent consult visits: 12/21/20 Melony Overly (ENT): Patient presented for dysphagia.  11/24/20 Telephone called with neurology: Decreased Trileptal to 150 mg twice daily.  12/12/20 Telephone called with neurology: Restarted Trileptal 300 mg BID.  10/28/20 Morrison Old, MD (neurosurgery): Patient presented for initial consult for trigeminal neuralgia. Discussed potential gamma knife radiosurgery.  10/15/20 Sarina Ill, MD (neurology): Patient presented for trigeminal neuralgia follow up. No medication changes.  10/06/20 Almyra Deforest, PA (cardiology): Patient presented for edema follow up.  07/16/20 [name], NP (neurology): Patient presented for trigeminal neuralgia follow up.   07/08/20 Sarina Ill, MD (neurology): Patient presented for memory loss. Referral placed to neuropsychology.  06/03/20 Fenton Malling, NP (neurosurgery & spine): Patient presented for follow up. Unable to access notes.   Hospital visits: 01/01/21 Patient presented to the [facility] ED after a fall.  10/05/20 Patient presented to the [facility] ED with abdominal pain. Abdominal CT consistent with kidney stone.  Objective:  Lab Results  Component Value Date   CREATININE 1.11 (H) 11/26/2020   BUN 20 11/26/2020   GFR 46.21 (L) 10/01/2020   GFRNONAA 50 (L) 10/05/2020   GFRAA 62 07/08/2020   NA 142 11/26/2020   K 4.5 11/26/2020   CALCIUM 10.5 (H) 11/26/2020   CO2 30 (H) 11/26/2020    Lab Results  Component Value Date/Time   HGBA1C 5.5 05/25/2018 10:52 AM   GFR 46.21 (L) 10/01/2020 10:00 AM   GFR 59.39 (L) 07/01/2020 02:49 PM    Last diabetic Eye exam: No  results found for: HMDIABEYEEXA  Last diabetic Foot exam: No results found for: HMDIABFOOTEX   Lab Results   Component Value Date   CHOL 279 (H) 05/27/2019   HDL 118.80 05/27/2019   LDLCALC 145 (H) 05/27/2019   LDLDIRECT 135.9 10/16/2012   TRIG 78.0 05/27/2019   CHOLHDL 2 05/27/2019    Hepatic Function Latest Ref Rng & Units 11/26/2020 11/05/2020 10/05/2020  Total Protein 6.0 - 8.5 g/dL 6.6 6.4 7.0  Albumin 3.6 - 4.6 g/dL 4.5 4.4 4.3  AST 0 - 40 IU/L 15 18 39  ALT 0 - 32 IU/L 15 16 25   Alk Phosphatase 44 - 121 IU/L 81 86 81  Total Bilirubin 0.0 - 1.2 mg/dL 0.6 0.6 0.6  Bilirubin, Direct 0.0 - 0.3 mg/dL - - -    Lab Results  Component Value Date/Time   TSH 0.88 07/01/2020 02:49 PM   TSH 0.54 05/27/2019 10:45 AM   FREET4 0.74 05/30/2017 08:01 AM    CBC Latest Ref Rng & Units 11/26/2020 11/05/2020 10/05/2020  WBC 3.4 - 10.8 x10E3/uL 6.8 5.2 8.5  Hemoglobin 11.1 - 15.9 g/dL 14.1 14.6 15.4(H)  Hematocrit 34.0 - 46.6 % 44.3 42.5 47.7(H)  Platelets 150 - 450 x10E3/uL 222 214 203    Lab Results  Component Value Date/Time   VD25OH 19.97 (L) 05/25/2018 10:52 AM    Clinical ASCVD: No  The ASCVD Risk score (Arnett DK, et al., 2019) failed to calculate for the following reasons:   The 2019 ASCVD risk score is only valid for ages 1 to 55    Depression screen PHQ 2/9 03/25/2020 05/27/2019  Decreased Interest 0 0  Down, Depressed, Hopeless 0 0  PHQ - 2 Score 0 0  Some recent data might be hidden     Social History   Tobacco Use  Smoking Status Never  Smokeless Tobacco Never   BP Readings from Last 3 Encounters:  01/11/21 116/60  01/01/21 (!) 184/76  11/10/20 136/82   Pulse Readings from Last 3 Encounters:  01/11/21 65  01/01/21 62  11/10/20 (!) 59   Wt Readings from Last 3 Encounters:  01/11/21 124 lb 9.6 oz (56.5 kg)  01/01/21 120 lb (54.4 kg)  11/10/20 123 lb 3.2 oz (55.9 kg)    Assessment/Interventions: Review of patient past medical history, allergies, medications, health status, including review of consultants reports, laboratory and other test data, was performed as part  of comprehensive evaluation and provision of chronic care management services.   SDOH:  (Social Determinants of Health) assessments and interventions performed: No   CCM Care Plan  Allergies  Allergen Reactions   Elemental Sulfur Hives and Itching   Sulfamethoxazole-Trimethoprim Other (See Comments)    REACTION: unspecified    Medications Reviewed Today     Reviewed by Viona Gilmore, [facility] (Pharmacist) on 01/28/21 at [place] List Status: <None>   Medication Order Taking? Sig Documenting Provider Last Dose Status Informant  Acetaminophen (TYLENOL PO) [id number]  Take 2 tablets by mouth as needed. [provider]  Active Self           Med Note Burt Knack, ERICIA A   Sat May 16, 2020  2:13 AM) Used @ home PRN  alendronate (FOSAMAX) 70 MG tablet [id number] No TAKE 1 TABLET BY MOUTH 1 TIME A WEEK WITH FULL GLASS OF WATER AND ON AN EMPTY STOMACH AS DIRECTED  Patient not taking: Reported on 01/28/2021   [name], [name], MD Not Taking Active   benazepril (LOTENSIN)  20 MG tablet [id number]  Take 1 tablet (20 mg total) by mouth 2 (two) times daily. [name], [name], MD  Active   butalbital-acetaminophen-caffeine (FIORICET WITH CODEINE) [phone] MG capsule [id number]  Take 1 capsule by mouth every 4 (four) hours as needed for headache. Rescue [name], [name], MD  Active            Med Note Virgel Paling A   Sat May 16, 2020  2:10 AM) Has @ home PRN  carvedilol (COREG) 25 MG tablet [id number]  TAKE 1 TABLET(25 MG) BY MOUTH TWICE DAILY WITH A MEAL [name], [name], MD  Active   conjugated estrogens (PREMARIN) vaginal cream [id number]  2 (two) times a week. [provider]  Active   fluticasone (FLONASE) 50 MCG/ACT nasal spray [id number]  Place 2 sprays into both nostrils as needed for allergies or rhinitis. [name], [name], MD  Active Self  furosemide (LASIX) 40 MG tablet [id number] Yes Take 1 tablet (40 mg total) by mouth daily. [name], [name], MD Taking Active   lamoTRIgine  (LAMICTAL) 100 MG tablet [id number] Yes Take 2 pills in the morning(266m) and evening. May take an additional 1/2 pill(561m if needed with the morning and evening doses AhMelvenia BeamMD Taking Active   Multiple Vitamin (MULTIVITAMIN ADULT PO) [id number] Take 1 tablet by mouth. [provider] Taking Active Self  omeprazole (PRILOSEC) 20 MG capsule [id number] 1 capsule (20 mg total) by mouth daily. [name], WaStandley BrookingMD  Active   ondansetron (ZOFRAN-ODT) 4 MG disintegrating tablet [id number] 1-2 tablets (4-8 mg total) by mouth every 8 (eight) hours as needed for nausea. AhMelvenia BeamMD  Active Self           Med Note (KVikki Ports SaGYKec 25, 2021  2:13 AM) Has @ home PRN  OVER THE COUNTER MEDICATION [id number] eye drops [provider]  Active Self  Oxcarbazepine (TRILEPTAL) 300 MG tablet [id number] 0.5 tablets (150 mg total) by mouth 2 (two) times daily. AhMelvenia BeamMD  Active             Patient Active Problem List   Diagnosis Date Noted   Lab test positive for detection of COVID-19 virus 05/25/2020   Abdominal pain 05/16/2020   Hematoma of abdominal wall, initial encounter 05/16/2020   Spondylosis without myelopathy or radiculopathy, lumbar region 03/27/2019   Radiculopathy due to lumbar intervertebral disc disorder 03/27/2019   Spinal stenosis of lumbar region with neurogenic claudication 03/27/2019   Diarrhea 11/27/2017   Unilateral headache 05/26/2015   Gallstones 03/12/2015   Nausea without vomiting 12/10/2014   Essential hypertension 05/29/2014   Post-traumatic headache 09/19/2013   Low back pain radiating to right leg 01/16/2013   Sinus problem 11/20/2012   Chronic headaches morning 10/19/2012   Low sodium levels  133 10/19/2012   Leg pain, posterior 08/16/2012   Sciatic neuritis 08/16/2012   Sleep disturbance, unspecified 08/16/2012   Medication management 03/24/2012   Preventative health care 09/24/2011    Trigeminal neuralgia 09/24/2011   Postmenopausal HRT (hormone replacement therapy) 09/24/2011   Polycythemia 09/20/2011   Morning headache 02/01/2011   Hearing aid worn    Abnormal blood finding    LOCALIZED SUPERFICIAL SWELLING MASS OR LUMP 09/21/2009   DYSPNEA 05/07/2008   Hypnic headache 03/19/2008   Cholelithiasis with chronic cholecystitis 03/19/2008   Headache(784.0) 06/05/2007   JAW PAIN 04/06/2007   Osteoarthritis 04/06/2007  ABNORMAL RESULT, FUNCTION STUDY, [place] 02/05/2007   HYPERLIPIDEMIA 12/25/2006   HYPERTENSION 12/25/2006    Immunization History  Administered Date(s) Administered   Fluad Quad(high Dose 65+) 02/18/2019, 03/17/2020   Influenza Split 03/10/2011, 02/02/2012   Influenza Whole 02/18/2008, 03/03/2010   Influenza, High Dose Seasonal PF 03/04/2014, 02/01/2016, 02/22/2017, 03/20/2018   Influenza,inj,Quad PF,6+ Mos 03/05/2013   PFIZER(Purple Top)SARS-COV-2 Vaccination 06/01/2019, 06/22/2019   Pneumococcal Conjugate-13 11/13/2013   Pneumococcal Polysaccharide-23 03/23/2015   Td 05/23/1996   Tdap 09/19/2011   Zoster Recombinat (Shingrix) 04/15/2020   Zoster, Live 09/19/2011   Spoke with patient's husband as he manages her medications and the patient was not feeling well and had just taken a nap. He reports her pain was not well covered with oxcarbazepine twice daily and increased to three times daily. Per dispense report, pharmacy filled lamotrigine 25 mg and 100 mg tablets in August but patient's husband confirmed he did not have any of he 25 mg tablets and patient is taking 1 of the 200 mg tablets BID (last filled 09/29/20) and 1/2 of the 100 mg tablet BID. Called to confirm with the pharmacy that the lamotrigine 25 mg have not been picked up since April 2022.  Conditions to be addressed/monitored:  Hypertension, Hyperlipidemia, GERD, Osteoporosis and migraines/trigeminal neuralgia, dyspnea, pain  Conditions addressed this visit: Hypertension,  osteoporosis  Care Plan : [place]  Updates made by Viona Gilmore, [place] since 01/29/2021 12:00 AM     Problem: Problem: Hypertension, Hyperlipidemia, GERD, Osteoporosis and migraines/trigeminal neuralgia, dyspnea, pain      Long-Range Goal: Patient-Specific Goal   Start Date: 07/30/2020  Expected End Date: 07/30/2021  Recent Progress: On track  Priority: High  Note:   Current Barriers:  Unable to independently monitor therapeutic efficacy  Pharmacist Clinical Goal(s):  Patient will achieve adherence to monitoring guidelines and medication adherence to achieve therapeutic efficacy through collaboration with PharmD and provider.   Interventions: 1:1 collaboration with [name], [name], MD regarding development and update of comprehensive plan of care as evidenced by provider attestation and co-signature Inter-disciplinary care team collaboration (see longitudinal plan of care) Comprehensive medication review performed; medication list updated in electronic medical record  Hypertension (BP goal <140/90) -Uncontrolled -Current treatment: Benazepril 20 mg 1 tablet twice daily Carvedilol 25 mg 1 tablet twice daily Furosemide 40 mg 1 tablet daily  -Medications previously tried: clonidine (swelling) -Current home readings: 140/70s (checking infrequently) -Current dietary habits: eats some canned foods (chooses low sodium), eats some frozen foods - chips, hamburgers, sandwiches, sausage - recommended looking at package labels  -Current exercise habits: doing physical therapy exercises; twice a week soft exercise program through the TV -Denies hypotensive/hypertensive symptoms -Educated on Exercise goal of 150 minutes per week; Importance of home blood pressure monitoring; Proper BP monitoring technique; -Counseled to monitor BP at home at least weekly, document, and provide log at future appointments -Counseled on diet and exercise extensively Recommended to continue  current medication  Hyperlipidemia: (LDL goal < 100) -Uncontrolled -Current treatment: No medications -Medications previously tried: none  -Current dietary patterns: did not discuss -Current exercise habits: doing physical therapy exercises; twice a week soft exercise program through the TV -Educated on Cholesterol goals;  Exercise goal of 150 minutes per week; -Counseled on diet and exercise extensively  Osteoporosis (Goal prevent fractures and improve bone density) -Controlled -Last DEXA Scan: 05/28/2018             T-Score femoral neck: R -1.3, L -2.0  T-Score total hip: n/a             T-Score lumbar spine: -0.9             T-Score forearm radius: n/a             10-year probability of major osteoporotic fracture: 21.4%             10-year probability of hip fracture: 6.0% -Patient is a candidate for pharmacologic treatment due to T-Score -1.0 to -2.5 and 10-year risk of major osteoporotic fracture > 20% and T-Score -1.0 to -2.5 and 10-year risk of hip fracture > 3% -Current treatment  Alendronate 70 mg 1 tablet weekly - holding Calcium 600 mg & 800 units of vitamin D twice daily -Medications previously tried: none  -Recommend [phone] units of vitamin D daily. Recommend 1200 mg of calcium daily from dietary and supplemental sources. Counseled on oral bisphosphonate administration: take in the morning, 30 minutes prior to food with 6-8 oz of water. Do not lie down for at least 30 minutes after taking. Recommend weight-bearing and muscle strengthening exercises for building and maintaining bone density. -Recommended repeat vitamin D level and repeat DEXA   Migraines/trigeminal neuralgia (Goal: minimize pain and severity) -Controlled -Current treatment  Lamotrigine 200 mg 1 tablet twice daily Lamotrigine 100 mg 1/2 tablet twice daily Oxcarbazepine 300 mg 1/2 tablet three times daily Fioricet with codeine 50-325-40-30 mg 1 capsule as needed - not taking -Medications  previously tried: none  -Recommended to continue current medication  GERD (Goal: minimize symptoms of acid reflux/heartburn) -Controlled -Current treatment  Omeprazole 20 mg 1 capsule daily (twice weekly to protect stomach) -Medications previously tried: none  -Recommended to continue current medication  Pain (Goal: minimize pain) -Controlled -Current treatment  Tylenol 500 mg 2 tablets PRN Tramadol 50 mg 1 tablet PRN -Medications previously tried: none  -Recommended to continue current medication   Health Maintenance -Vaccine gaps: COVID booster, second shingrix dose -Current therapy:  Ondansetron 4 mg ODT PRN Premarin vaginal cream 1 applicatorful twice weekly Systane eye drops PRN Flonase 2 sprays as needed - gets more headaches during allergy season Vitamin B (can't remember dose) -Educated on Cost vs benefit of each product must be carefully weighed by individual consumer -Patient is satisfied with current therapy and denies issues -Recommended to continue current medication  Patient Goals/Self-Care Activities Patient will:  - take medications as prescribed check blood pressure at least weekly, document, and provide at future appointments  Follow Up Plan: Telephone follow up appointment with care management team member scheduled for: 6 months       Medication Assistance: None required.  Patient affirms current coverage meets needs.  Compliance/Adherence/Medication fill history: Care Gaps: COVID booster, influenza vaccine, shingrix (second dose)   Star-Rating Drugs: Benazepril ([place]) 20 mg - Last filled 12-14-2020 90 DS at [facility]  Patient's preferred pharmacy is:  [facility] [place], [address] AT [address] [place] [address] Phone: [phone] Fax: [phone]  Uses pill box? Yes Pt endorses 100% compliance  We discussed: Current pharmacy is preferred with insurance  plan and patient is satisfied with pharmacy services Patient decided to: Continue current medication management strategy  Care Plan and Follow Up Patient Decision:  Patient agrees to Care Plan and Follow-up.  Plan: Telephone follow up appointment with care management team member scheduled for:  6 months  [name], PharmD [place] Pharmacist [facility]  at [place]

## 2021-01-29 ENCOUNTER — Other Ambulatory Visit (HOSPITAL_COMMUNITY): Payer: Self-pay

## 2021-01-29 ENCOUNTER — Telehealth (HOSPITAL_COMMUNITY): Payer: Self-pay

## 2021-01-29 DIAGNOSIS — R1310 Dysphagia, unspecified: Secondary | ICD-10-CM

## 2021-01-29 NOTE — Patient Instructions (Signed)
Hi [name],  It was great to speak with you! Don't forget to look for a chewable calcium tablet the next time you run out of calcium to help with swallowing.  Please reach out to me if you have any questions or need anything before our follow up!  Best, [name]  [name], PharmD, [employer] at [place]   Visit Information   Goals Addressed   None    Patient Care Plan: CCM Pharmacy Care Plan     Problem Identified: Problem: Hypertension, Hyperlipidemia, GERD, Osteoporosis and migraines/trigeminal neuralgia, dyspnea, pain      Long-Range Goal: Patient-Specific Goal   Start Date: 07/30/2020  Expected End Date: 07/30/2021  Recent Progress: On track  Priority: High  Note:   Current Barriers:  Unable to independently monitor therapeutic efficacy  Pharmacist Clinical Goal(s):  Patient will achieve adherence to monitoring guidelines and medication adherence to achieve therapeutic efficacy through collaboration with PharmD and provider.   Interventions: 1:1 collaboration with [name], [name], MD regarding development and update of comprehensive plan of care as evidenced by provider attestation and co-signature Inter-disciplinary care team collaboration (see longitudinal plan of care) Comprehensive medication review performed; medication list updated in electronic medical record  Hypertension (BP goal <140/90) -Uncontrolled -Current treatment: Benazepril 20 mg 1 tablet twice daily Carvedilol 25 mg 1 tablet twice daily Furosemide 40 mg 1 tablet daily  -Medications previously tried: clonidine (swelling) -Current home readings: 140/70s (checking infrequently) -Current dietary habits: eats some canned foods (chooses low sodium), eats some frozen foods - chips, hamburgers, sandwiches, sausage - recommended looking at package labels  -Current exercise habits: doing physical therapy exercises; twice a week soft exercise  program through the TV -Denies hypotensive/hypertensive symptoms -Educated on Exercise goal of 150 minutes per week; Importance of home blood pressure monitoring; Proper BP monitoring technique; -Counseled to monitor BP at home at least weekly, document, and provide log at future appointments -Counseled on diet and exercise extensively Recommended to continue current medication  Hyperlipidemia: (LDL goal < 100) -Uncontrolled -Current treatment: No medications -Medications previously tried: none  -Current dietary patterns: did not discuss -Current exercise habits: doing physical therapy exercises; twice a week soft exercise program through the TV -Educated on Cholesterol goals;  Exercise goal of 150 minutes per week; -Counseled on diet and exercise extensively  Osteoporosis (Goal prevent fractures and improve bone density) -Controlled -Last DEXA Scan: 05/28/2018             T-Score femoral neck: R -1.3, L -2.0             T-Score total hip: n/a             T-Score lumbar spine: -0.9             T-Score forearm radius: n/a             10-year probability of major osteoporotic fracture: 21.4%             10-year probability of hip fracture: 6.0% -Patient is a candidate for pharmacologic treatment due to T-Score -1.0 to -2.5 and 10-year risk of major osteoporotic fracture > 20% and T-Score -1.0 to -2.5 and 10-year risk of hip fracture > 3% -Current treatment  Alendronate 70 mg 1 tablet weekly - holding Calcium 600 mg & 800 units of vitamin D twice daily -Medications previously tried: none  -Recommend [phone] units of vitamin D daily. Recommend 1200 mg of calcium daily from dietary and supplemental sources.  Counseled on oral bisphosphonate administration: take in the morning, 30 minutes prior to food with 6-8 oz of water. Do not lie down for at least 30 minutes after taking. Recommend weight-bearing and muscle strengthening exercises for building and maintaining bone  density. -Recommended repeat vitamin D level and repeat DEXA   Migraines/trigeminal neuralgia (Goal: minimize pain and severity) -Controlled -Current treatment  Lamotrigine 200 mg 1 tablet twice daily Lamotrigine 100 mg 1/2 tablet twice daily Oxcarbazepine 300 mg 1/2 tablet three times daily Fioricet with codeine 50-325-40-30 mg 1 capsule as needed - not taking -Medications previously tried: none  -Recommended to continue current medication  GERD (Goal: minimize symptoms of acid reflux/heartburn) -Controlled -Current treatment  Omeprazole 20 mg 1 capsule daily (twice weekly to protect stomach) -Medications previously tried: none  -Recommended to continue current medication  Pain (Goal: minimize pain) -Controlled -Current treatment  Tylenol 500 mg 2 tablets PRN Tramadol 50 mg 1 tablet PRN -Medications previously tried: none  -Recommended to continue current medication   Health Maintenance -Vaccine gaps: COVID booster, second shingrix dose -Current therapy:  Ondansetron 4 mg ODT PRN Premarin vaginal cream 1 applicatorful twice weekly Systane eye drops PRN Flonase 2 sprays as needed - gets more headaches during allergy season Vitamin B (can't remember dose) -Educated on Cost vs benefit of each product must be carefully weighed by individual consumer -Patient is satisfied with current therapy and denies issues -Recommended to continue current medication  Patient Goals/Self-Care Activities Patient will:  - take medications as prescribed check blood pressure at least weekly, document, and provide at future appointments  Follow Up Plan: Telephone follow up appointment with care management team member scheduled for: 6 months       Patient verbalizes understanding of instructions provided today and agrees to view in [place].  Telephone follow up appointment with pharmacy team member scheduled for:6 months  Viona Gilmore, [facility]

## 2021-02-03 ENCOUNTER — Ambulatory Visit (INDEPENDENT_AMBULATORY_CARE_PROVIDER_SITE_OTHER): Payer: Medicare Other | Admitting: Gastroenterology

## 2021-02-03 ENCOUNTER — Encounter: Payer: Self-pay | Admitting: Gastroenterology

## 2021-02-03 VITALS — BP 110/66 | HR 60 | Ht 66.0 in | Wt 119.6 lb

## 2021-02-03 DIAGNOSIS — R1310 Dysphagia, unspecified: Secondary | ICD-10-CM | POA: Diagnosis not present

## 2021-02-03 DIAGNOSIS — R933 Abnormal findings on diagnostic imaging of other parts of digestive tract: Secondary | ICD-10-CM | POA: Diagnosis not present

## 2021-02-03 NOTE — Patient Instructions (Signed)
You have been scheduled for an endoscopy. Please follow written instructions given to you at your visit today. If you use inhalers (even only as needed), please bring them with you on the day of your procedure.  Due to recent changes in healthcare laws, you may see the results of your imaging and laboratory studies on MyChart before your provider has had a chance to review them.  We understand that in some cases there may be results that are confusing or concerning to you. Not all laboratory results come back in the same time frame and the provider may be waiting for multiple results in order to interpret others.  Please give [place] 48 hours in order for your provider to thoroughly review all the results before contacting the office for clarification of your results.   If you are age 81 or older, your body mass index should be between 23-30. Your Body mass index is 19.3 kg/m. If this is out of the aforementioned range listed, please consider follow up with your Primary Care Provider.   __________________________________________________________  The [facility] providers would like to encourage you to use [facility] to communicate with providers for non-urgent requests or questions.  Due to long hold times on the telephone, sending your provider a message by [facility] may be a faster and more efficient way to get a response.  Please allow 48 business hours for a response.  Please remember that this is for non-urgent requests.   Thank you for choosing me and [facility].  Alonza Bogus PA-C

## 2021-02-03 NOTE — Progress Notes (Signed)
02/03/2021 [name] [id number] [birth year]   HISTORY OF PRESENT ILLNESS:  This is an 80 year old female who is a patient of Dr. Celesta Aver.  She is here today with complaints of dysphagia.  She says that she has been experiencing issues with swallowing for the past 1-2 months.  Mostly issues with swallowing solid food but also having some issues with swallowing liquids as well so she has to drink slowly.  Esophagram showed the following:  IMPRESSION: 1. Luminal narrowing in the high cervical esophagus over a 3 cm segment is highly concerning for esophageal neoplasm. Inflammatory process would be a secondary consideration. Luminal narrowing occurs approximately at the C5-C7 vertebral body level just below the glottis. Patient experienced several episodes of choking related to this luminal narrowing. Recommend expedient upper GI endoscopy for evaluation. 2. Distal thoracic esophagus and GE junction appear normal.  She has lost a few pounds over this time.  No heartburn/reflux.  Has stopped her Fosamax and has been off of that for over a month.   Past Medical History:  Diagnosis Date   Abdominal pain 05/29/2013   s/p rx of cephalo resistant e coli   but last rx NG  now residular ?  bladder sx repeat cx sx rx to ty and uro consult    ADJ DISORDER WITH MIXED ANXIETY & DEPRESSED MOOD 03/03/2010   Qualifier: Diagnosis of  By: [name], [name]    Agent resistant to multiple antibiotics 05/29/2013   e coli   bu NG on fu.     ARF (acute renal failure) ([place]) 03/12/2015   Closed head injury 02/01/2011   from syncope and had scalp laceration  neg ct .     Closed head injury 5-6 yrs ago   Colitis 08/21/5828   Complication of anesthesia    migraine several hours after general anesthesia   Fatty liver    Gall stones 2016   see ct scan neg HIDA    GERD (gastroesophageal reflux disease)    Hearing aid worn    HOH (hard of hearing)    both ears   Hyperlipidemia    Hypertension     echo nl lv function  mild dilitation 2009   Kidney infection    few yrs ago in hospital   Medication side effect 09/02/2010   Poss muscle se of 10 crestor    Migraine    hypnic HA eval by Dr. 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    Pyelonephritis 03/12/2015   Sensation of pain in anesthetized distribution of trigeminal nerve    Syncope 02/01/2011   In shower on vacation  sustained head laceration  8 sutures Had ed visit neg head ct labs and x ray    Trigeminal neuralgia pain    Past Surgical History:  Procedure Laterality Date   ABDOMINAL HYSTERECTOMY  2002   tubal   CARDIAC CATHETERIZATION  2000   chest pains neg   CHOLECYSTECTOMY N/A 02/21/2017   Procedure: LAPAROSCOPIC CHOLECYSTECTOMY WITH INTRAOPERATIVE CHOLANGIOGRAM;  Surgeon: Armandina Gemma, MD;  Location: [facility];  Service: General;  Laterality: N/A;   COLONOSCOPY     multiple   CRANIOTOMY  12/09/2011   nerve decompression right trigeminal    DOPPLER ECHOCARDIOGRAPHY  2009   nl lv function mild lv dilitation   EYE SURGERY Bilateral    ioc for catatracts   laparoscopic gallbladder surgery  02/16/2017  Fax from [facility]ilateral 2002   rt shoulder surgery      reports that she has never smoked. She has never used smokeless tobacco. She reports that she does not currently use alcohol after a past usage of about 2.0 standard drinks per week. She reports that she does not use drugs. family history includes Alcohol abuse in her father; Cancer in her paternal aunt; Diabetes in her brother; Hypertension in an other family member; Ovarian cancer in her mother; Seizures in her daughter; Stroke in her father and mother. Allergies  Allergen Reactions   Elemental Sulfur Hives and Itching   Sulfamethoxazole-Trimethoprim Other (See Comments)    REACTION: unspecified      Outpatient Encounter Medications as of 02/03/2021  Medication Sig   Acetaminophen (TYLENOL PO) Take  2 tablets by mouth as needed.   alendronate (FOSAMAX) 70 MG tablet TAKE 1 TABLET BY MOUTH 1 TIME A WEEK WITH FULL GLASS OF WATER AND ON AN EMPTY STOMACH AS DIRECTED   benazepril (LOTENSIN) 20 MG tablet Take 1 tablet (20 mg total) by mouth 2 (two) times daily.   Calcium Carb-Cholecalciferol (CALCIUM 600+D) 600-800 MG-UNIT TABS Take 1 tablet by mouth 2 (two) times daily.   carvedilol (COREG) 25 MG tablet TAKE 1 TABLET(25 MG) BY MOUTH TWICE DAILY WITH A MEAL   conjugated estrogens (PREMARIN) vaginal cream 2 (two) times a week.   fluticasone (FLONASE) 50 MCG/ACT nasal spray Place 2 sprays into both nostrils as needed for allergies or rhinitis.   furosemide (LASIX) 40 MG tablet Take 1 tablet (40 mg total) by mouth daily.   lamoTRIgine (LAMICTAL) 100 MG tablet Take 2 pills in the morning(267m) and evening. May take an additional 1/2 pill(553m if needed with the morning and evening doses (Patient taking differently: Take 2 pills in the evening. May take an additional 1/2 pill(5017mif needed with the morning and evening doses)   Multiple Vitamin (MULTIVITAMIN ADULT PO) Take 1 tablet by mouth.   omeprazole (PRILOSEC) 20 MG capsule Take 1 capsule (20 mg total) by mouth daily.   ondansetron (ZOFRAN-ODT) 4 MG disintegrating tablet Take 1-2 tablets (4-8 mg total) by mouth every 8 (eight) hours as needed for nausea.   OVER THE COUNTER MEDICATION OTC eye drops   Oxcarbazepine (TRILEPTAL) 300 MG tablet Take 0.5 tablets (150 mg total) by mouth 2 (two) times daily. (Patient taking differently: Take 150 mg by mouth 3 (three) times daily.)   [DISCONTINUED] butalbital-acetaminophen-caffeine (FIORICET WITH CODEINE) 50-325-40-30 MG capsule Take 1 capsule by mouth every 4 (four) hours as needed for headache. Rescue (Patient not taking: Reported on 02/03/2021)   No facility-administered encounter medications on file as of 02/03/2021.     REVIEW OF SYSTEMS  : All other systems reviewed and negative except where noted in  the History of Present Illness.   PHYSICAL EXAM: BP 110/66   Pulse 60   Ht 5' 6"  (1.676 m)   Wt 119 lb 9.6 oz (54.3 kg)   SpO2 92%   BMI 19.30 kg/m  General: Well developed white female in no acute distress Head: Normocephalic and atraumatic Eyes:  Sclerae anicteric, conjunctiva pink. Ears: Normal auditory acuity Lungs: Clear throughout to auscultation; no W/R/R. Heart: Regular rate and rhythm; no M/R/G. Abdomen: Soft, non-distended.  BS present.  Non-tender. Musculoskeletal: Symmetrical with no gross deformities  Skin: No lesions on visible extremities Extremities: No edema  Neurological: Alert oriented x 4, grossly non-focal Psychological:  Alert and cooperative. Normal mood  and affect  ASSESSMENT AND PLAN: *Dysphagia:  Mostly to solid food, but also some difficulty with liquids as well.  Has developed over the past one to two months.  Had an esophagram that shows narrowing in the high cervical esophagus concerning for neoplasm vs inflammatory process.  Will plan for EGD with Dr. Carlean Purl tomorrow.  The risks, benefits, and alternatives to EGD were discussed with the patient and she consents to proceed.  She is scheduled for a MBSS on Friday but we are going to cancel that for now.   CC:  [name], [name], MD

## 2021-02-04 ENCOUNTER — Encounter: Payer: Self-pay | Admitting: Internal Medicine

## 2021-02-04 ENCOUNTER — Ambulatory Visit (AMBULATORY_SURGERY_CENTER): Payer: Medicare Other | Admitting: Internal Medicine

## 2021-02-04 ENCOUNTER — Telehealth: Payer: Self-pay | Admitting: Internal Medicine

## 2021-02-04 ENCOUNTER — Other Ambulatory Visit (INDEPENDENT_AMBULATORY_CARE_PROVIDER_SITE_OTHER): Payer: Medicare Other

## 2021-02-04 ENCOUNTER — Other Ambulatory Visit: Payer: Self-pay

## 2021-02-04 VITALS — BP 175/97 | HR 61 | Temp 97.3°F | Resp 14 | Ht 66.0 in | Wt 119.0 lb

## 2021-02-04 DIAGNOSIS — R1310 Dysphagia, unspecified: Secondary | ICD-10-CM | POA: Diagnosis not present

## 2021-02-04 DIAGNOSIS — K222 Esophageal obstruction: Secondary | ICD-10-CM | POA: Diagnosis not present

## 2021-02-04 LAB — COMPREHENSIVE METABOLIC PANEL
ALT: 13 U/L (ref 0–35)
AST: 14 U/L (ref 0–37)
Albumin: 3.7 g/dL (ref 3.5–5.2)
Alkaline Phosphatase: 82 U/L (ref 39–117)
BUN: 15 mg/dL (ref 6–23)
CO2: 36 mEq/L — ABNORMAL HIGH (ref 19–32)
Calcium: 10.4 mg/dL (ref 8.4–10.5)
Chloride: 100 mEq/L (ref 96–112)
Creatinine, Ser: 0.92 mg/dL (ref 0.40–1.20)
GFR: 58.37 mL/min — ABNORMAL LOW (ref >60.00)
Glucose, Bld: 84 mg/dL (ref 70–99)
Potassium: 3.5 mEq/L (ref 3.5–5.1)
Sodium: 141 mEq/L (ref 135–145)
Total Bilirubin: 0.7 mg/dL (ref 0.2–1.2)
Total Protein: 6.6 g/dL (ref 6.0–8.3)

## 2021-02-04 LAB — CBC
HCT: 40.4 % (ref 36.0–46.0)
Hemoglobin: 13.3 g/dL (ref 12.0–15.0)
MCHC: 33 g/dL (ref 30.0–36.0)
MCV: 89.1 fl (ref 78.0–100.0)
Platelets: 205 10*3/uL (ref 150.0–400.0)
RBC: 4.53 Mil/uL (ref 3.87–5.11)
RDW: 14.1 % (ref 11.5–15.5)
WBC: 5.3 10*3/uL (ref 4.0–10.5)

## 2021-02-04 MED ORDER — SODIUM CHLORIDE 0.9 % IV SOLN: 500.0000 mL | Freq: Once | INTRAVENOUS | Status: DC

## 2021-02-04 NOTE — Telephone Encounter (Signed)
Patients spouse called and said the patient ate yogurt at 8:00 am this morning please advise

## 2021-02-04 NOTE — Op Note (Signed)
[place] Patient Name: [name] Procedure Date: 02/04/2021 2:32 PM MRN: [id number] Endoscopist: [name] , MD Age: 80 Referring MD:  Date of Birth: Aug 27, 1940 Gender: Female Account #: [id number] Procedure:                Upper GI endoscopy Indications:              Dysphagia, Suspected stenosis of the esophagus,                            Abnormal cine-esophagram Medicines:                Propofol per Anesthesia, Monitored Anesthesia Care Procedure:                Pre-Anesthesia Assessment:                           - Prior to the procedure, a History and Physical                            was performed, and patient medications and                            allergies were reviewed. The patient's tolerance of                            previous anesthesia was also reviewed. The risks                            and benefits of the procedure and the sedation                            options and risks were discussed with the patient.                            All questions were answered, and informed consent                            was obtained. Prior Anticoagulants: The patient has                            taken no previous anticoagulant or antiplatelet                            agents. ASA Grade Assessment: III - A patient with                            severe systemic disease. After reviewing the risks                            and benefits, the patient was deemed in                            satisfactory condition to undergo the procedure.  After obtaining informed consent, the endoscope was                            passed under direct vision. Throughout the                            procedure, the patient's blood pressure, pulse, and                            oxygen saturations were monitored continuously. The                            Endoscope was introduced through the mouth, and                             advanced to the upper third of esophagus. The upper                            GI endoscopy was performed with difficulty due to                            stricture. The patient tolerated the procedure well. Scope In: Scope Out: Findings:                 One mass-like severe stenosis was found in the                            upper third of the esophagus. The stenosis was not                            traversed. Complications:            No immediate complications. Estimated Blood Loss:     Estimated blood loss: none. Impression:               - Esophageal stenosis.                           - No specimens collected. Recommendation:           - Patient has a contact number available for                            emergencies. The signs and symptoms of potential                            delayed complications were discussed with the                            patient. Return to normal activities tomorrow.                            Written discharge instructions were provided to the                            patient.                           -  Resume previous diet.                           - Continue present medications.                           - CBC, CMET today (ordered)                           CT neck with contrast - will be ordered by my office                           Further plans pending that - has seen ENT Lucia Gaskins)                            early Aug w/ negative laryngoscopy [name], MD 02/04/2021 3:04:25 PM This report has been signed electronically.

## 2021-02-04 NOTE — Telephone Encounter (Signed)
Spoke with pt husband. He stated that she had a teaspoon of yogurt this morning, and has not had anything else since then. I spoke with CRNA and MD and advise pt that we could still proceed with EGD this afternoon. Explain to pt spouse that she could not have anything else to eat, and clear liquids until 11am. He verbalized understanding, and had no other concerns.

## 2021-02-04 NOTE — Progress Notes (Signed)
History and Physical Interval Note:  02/04/2021 2:35 PM  [name]  has presented today for endoscopic procedure(s), with the diagnosis of  Encounter Diagnosis  Name Primary?   Dysphagia, unspecified type Yes  .  The various methods of evaluation and treatment have been discussed with the patient and/or family. After consideration of risks, benefits and other options for treatment, the patient has consented to  the endoscopic procedure(s).   The patient's history has been reviewed, patient examined, no change in status, stable for endoscopic procedure(s).  I have reviewed the patient's chart and labs.  Questions were answered to the patient's satisfaction.     [name], MD, Marval Regal

## 2021-02-04 NOTE — Progress Notes (Signed)
Pt in recovery with monitors in place, VSS. Report given to receiving RN. Bite guard was placed with pt awake to ensure comfort. No dental or soft tissue damage noted.

## 2021-02-04 NOTE — Patient Instructions (Addendum)
The upper esophagus is abnormal and narrowed - there is some sort of a mass there. I cannot tell what it is and it is unsafe to biopsy this way.   You need a CT scan of the neck which we will arrange as soon as we are able to get it scheduled.  Remain on a liquid or pureed diet.  I appreciate the opportunity to care for you. [name], MD, FACG  YOU HAD AN ENDOSCOPIC PROCEDURE TODAY AT [facility]:   Refer to the procedure report that was given to you for any specific questions about what was found during the examination.  If the procedure report does not answer your questions, please call your gastroenterologist to clarify.  If you requested that your care partner not be given the details of your procedure findings, then the procedure report has been included in a sealed envelope for you to review at your convenience later.  YOU SHOULD EXPECT: Some feelings of bloating in the abdomen. Passage of more gas than usual.  Walking can help get rid of the air that was put into your GI tract during the procedure and reduce the bloating. If you had a lower endoscopy (such as a colonoscopy or flexible sigmoidoscopy) you may notice spotting of blood in your stool or on the toilet paper. If you underwent a bowel prep for your procedure, you may not have a normal bowel movement for a few days.  Please Note:  You might notice some irritation and congestion in your nose or some drainage.  This is from the oxygen used during your procedure.  There is no need for concern and it should clear up in a day or so.  SYMPTOMS TO REPORT IMMEDIATELY:  Following upper endoscopy (EGD)  Vomiting of blood or coffee ground material  New chest pain or pain under the shoulder blades  Painful or persistently difficult swallowing  New shortness of breath  Fever of 100F or higher  Black, tarry-looking stools  For urgent or emergent issues, a gastroenterologist can be reached at any hour by calling  [phone]. Do not use MyChart messaging for urgent concerns.    DIET:  We do recommend a small meal at first, but then you may proceed to your regular diet.  Drink plenty of fluids but you should avoid alcoholic beverages for 24 hours.  ACTIVITY:  You should plan to take it easy for the rest of today and you should NOT DRIVE or use heavy machinery until tomorrow (because of the sedation medicines used during the test).    FOLLOW UP: Our staff will call the number listed on your records 48-72 hours following your procedure to check on you and address any questions or concerns that you may have regarding the information given to you following your procedure. If we do not reach you, we will leave a message.  We will attempt to reach you two times.  During this call, we will ask if you have developed any symptoms of COVID 19. If you develop any symptoms (ie: fever, flu-like symptoms, shortness of breath, cough etc.) before then, please call [phone].  If you test positive for Covid 19 in the 2 weeks post procedure, please call and report this information to [place].    If any biopsies were taken you will be contacted by phone or by letter within the next 1-3 weeks.  Please call us at [phone] if you have not heard about the biopsies in  3 weeks.    SIGNATURES/CONFIDENTIALITY: You and/or your care partner have signed paperwork which will be entered into your electronic medical record.  These signatures attest to the fact that that the information above on your After Visit Summary has been reviewed and is understood.  Full responsibility of the confidentiality of this discharge information lies with you and/or your care-partner.

## 2021-02-05 ENCOUNTER — Telehealth: Payer: Self-pay

## 2021-02-05 ENCOUNTER — Ambulatory Visit (HOSPITAL_COMMUNITY): Payer: Medicare Other

## 2021-02-05 ENCOUNTER — Encounter (HOSPITAL_COMMUNITY): Payer: Medicare Other

## 2021-02-05 ENCOUNTER — Ambulatory Visit (INDEPENDENT_AMBULATORY_CARE_PROVIDER_SITE_OTHER)
Admission: RE | Admit: 2021-02-05 | Discharge: 2021-02-05 | Disposition: A | Discharge status: Home / Self Care | Payer: Medicare Other | Source: Ambulatory Visit | Attending: Internal Medicine | Admitting: Internal Medicine

## 2021-02-05 DIAGNOSIS — K2289 Other specified disease of esophagus: Secondary | ICD-10-CM | POA: Diagnosis not present

## 2021-02-05 DIAGNOSIS — E042 Nontoxic multinodular goiter: Secondary | ICD-10-CM | POA: Diagnosis not present

## 2021-02-05 MED ORDER — IOHEXOL 300 MG/ML  SOLN
75.0000 mL | Freq: Once | INTRAMUSCULAR | Status: AC | PRN
  Administered 2021-02-05: 75 mL via INTRAVENOUS

## 2021-02-05 NOTE — Telephone Encounter (Signed)
Patient has been scheduled for 02/05/21  at 1:30. She needs to arrive at 1:15.  Patient notified to arrive at [facility] CT at 1:15 and have no solids for 2 hours prior and to push fluids today.

## 2021-02-05 NOTE — Telephone Encounter (Signed)
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

## 2021-02-05 NOTE — Progress Notes (Signed)
Labs are fine CT next

## 2021-02-08 ENCOUNTER — Ambulatory Visit (INDEPENDENT_AMBULATORY_CARE_PROVIDER_SITE_OTHER): Payer: Medicare Other | Admitting: Otolaryngology

## 2021-02-08 ENCOUNTER — Other Ambulatory Visit: Payer: Self-pay

## 2021-02-08 ENCOUNTER — Telehealth: Payer: Self-pay | Admitting: *Deleted

## 2021-02-08 DIAGNOSIS — R1314 Dysphagia, pharyngoesophageal phase: Secondary | ICD-10-CM

## 2021-02-08 DIAGNOSIS — D3705 Neoplasm of uncertain behavior of pharynx: Secondary | ICD-10-CM

## 2021-02-08 NOTE — Telephone Encounter (Signed)
  Follow up Call-  Call back number 02/04/2021  Post procedure Call Back phone  # [phone]  Permission to leave phone message Yes  Some recent data might be hidden     Patient questions:  Do you have a fever, pain , or abdominal swelling? No. Pain Score  0 *  Have you tolerated food without any problems? Yes.    Have you been able to return to your normal activities? Yes.    Do you have any questions about your discharge instructions: Diet   No. Medications  No. Follow up visit  No.  Do you have questions or concerns about your Care? No.  Actions: * If pain score is 4 or above: No action needed, pain <4.   Have you developed a fever since your procedure? no  2.   Have you had an respiratory symptoms (SOB or cough) since your procedure? no  3.   Have you tested positive for COVID 19 since your procedure no  4.   Have you had any family members/close contacts diagnosed with the COVID 19 since your procedure?  no   If yes to any of these questions please route to Joylene John, RN and Joella Prince, RN

## 2021-02-08 NOTE — Progress Notes (Signed)
HPI: [name] is a 80 y.o. female who presents is referred by Dr. Regis Bill and Dr. Carlean Purl for evaluation of mass in the hypopharyngeal area at the region of the upper esophageal sphincter.  Patient has had dysphagia for several months.  I had seen her previously with clear hypopharynx and vocal cords.  Dr. Katha Cabal was attempting upper endoscopy but was unable to pass the esophagoscope through the upper esophageal sphincter because of a mass-effect.  He had a CT scan performed of the neck that demonstrated.  A 2 x 3 cm mass at the upper esophageal region on the left side with what appears to be some nodal mets along the esophagus and adjacent to the hypopharyngeal mass.  This is highly suspicious for malignancy.  I was contacted by Dr. Carlean Purl to perform direct laryngoscopy and biopsy.  Past Medical History:  Diagnosis Date   Abdominal pain 05/29/2013   s/p rx of cephalo resistant e coli   but last rx NG  now residular ?  bladder sx repeat cx sx rx to ty and uro consult    ADJ DISORDER WITH MIXED ANXIETY & DEPRESSED MOOD 03/03/2010   Qualifier: Diagnosis of  By: [name], [name]    Agent resistant to multiple antibiotics 05/29/2013   e coli   bu NG on fu.     ARF (acute renal failure) ([place]) 03/12/2015   Closed head injury 02/01/2011   from syncope and had scalp laceration  neg ct .     Closed head injury 5-6 yrs ago   Colitis 0/01/3234   Complication of anesthesia    migraine several hours after general anesthesia   Fatty liver    Gall stones 2016   see ct scan neg HIDA    GERD (gastroesophageal reflux disease)    Hearing aid worn    HOH (hard of hearing)    both ears   Hyperlipidemia    Hypertension    echo nl lv function  mild dilitation 2009   Kidney infection    few yrs ago in hospital   Medication side effect 09/02/2010   Poss muscle se of 10 crestor    Migraine    hypnic HA eval by Dr. 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    Pyelonephritis  03/12/2015   Sensation of pain in anesthetized distribution of trigeminal nerve    Syncope 02/01/2011   In shower on vacation  sustained head laceration  8 sutures Had ed visit neg head ct labs and x ray    Trigeminal neuralgia pain    Past Surgical History:  Procedure Laterality Date   ABDOMINAL HYSTERECTOMY  2002   tubal   BACK SURGERY     2 times, for sciatic nerve pain   CARDIAC CATHETERIZATION  2000   chest pains neg   CHOLECYSTECTOMY N/A 02/21/2017   Procedure: LAPAROSCOPIC CHOLECYSTECTOMY WITH INTRAOPERATIVE CHOLANGIOGRAM;  Surgeon: Armandina Gemma, MD;  Location: [facility];  Service: General;  Laterality: N/A;   COLONOSCOPY     multiple   CRANIOTOMY  12/09/2011   nerve decompression right trigeminal    DOPPLER ECHOCARDIOGRAPHY  2009   nl lv function mild lv dilitation   EYE SURGERY Bilateral    ioc for catatracts   laparoscopic gallbladder surgery  02/16/2017   Fax from [place] Bilateral 2002   rt shoulder surgery     Social History   Socioeconomic  History   Marital status: Married    Spouse name: Not on file   Number of children: 2   Years of education: Not on file   Highest education level: Not on file  Occupational History    Comment: retired Forensic psychologist  Tobacco Use   Smoking status: Never   Smokeless tobacco: Never  Vaping Use   Vaping Use: Never used  Substance and Sexual Activity   Alcohol use: Not Currently    Alcohol/week: 2.0 standard drinks    Types: 2 Glasses of wine per week    Comment: occ wine   Drug use: No   Sexual activity: Not on file  Other Topics Concern   Not on file  Social History Narrative   Married   HH of 2-3 (god daughter)   Pets 2 dogs   Non smoker    Child is a physician   G2P2      Caffeine: 2 cups/day   Social Determinants of Radio broadcast assistant Strain: Low Risk    Difficulty of Paying Living Expenses: Not hard at all  Food Insecurity: No Food Insecurity   Worried About  Charity fundraiser in the Last Year: Never true   [place] in the Last Year: Never true  Transportation Needs: No Transportation Needs   Lack of Transportation (Medical): No   Lack of Transportation (Non-Medical): No  Physical Activity: Inactive   Days of Exercise per Week: 0 days   Minutes of Exercise per Session: 0 min  Stress: No Stress Concern Present   Feeling of Stress : Not at all  Social Connections: Moderately Integrated   Frequency of Communication with Friends and Family: More than three times a week   Frequency of Social Gatherings with Friends and Family: Once a week   Attends Religious Services: 1 to 4 times per year   Active Member of [employer] or Organizations: No   Attends Music therapist: Never   Marital Status: Married   Family History  Problem Relation Age of Onset   Ovarian cancer Mother    Stroke Mother    Alcohol abuse Father    Stroke Father    Diabetes Brother    Cancer Paternal Aunt        leukemia, unknown type   Seizures Daughter    Hypertension Other    Colon cancer Neg Hx    Dementia Neg Hx    Alzheimer's disease Neg Hx    Allergies  Allergen Reactions   Elemental Sulfur Hives and Itching   Hydrocodone Other (See Comments)    Rebound headaches   Sulfamethoxazole-Trimethoprim Other (See Comments)    REACTION: unspecified   Prior to Admission medications   Medication Sig Start Date End Date Taking? Authorizing Provider  Acetaminophen (TYLENOL PO) Take 2 tablets by mouth as needed.    [provider]  alendronate (FOSAMAX) 70 MG tablet TAKE 1 TABLET BY MOUTH 1 TIME A WEEK WITH FULL GLASS OF WATER AND ON AN EMPTY STOMACH AS DIRECTED Patient not taking: Reported on 02/04/2021 10/26/20   [name], [name], MD  benazepril (LOTENSIN) 20 MG tablet Take 1 tablet (20 mg total) by mouth 2 (two) times daily. 11/02/20   [name], [name], MD  Calcium Carb-Cholecalciferol (CALCIUM 600+D) 600-800 MG-UNIT TABS Take 1 tablet by mouth 2  (two) times daily.    [provider]  carvedilol (COREG) 25 MG tablet TAKE 1 TABLET(25 MG) BY MOUTH TWICE DAILY  WITH A MEAL 12/28/20   [name], [name], MD  conjugated estrogens (PREMARIN) vaginal cream 2 (two) times a week.    [provider]  fluticasone (FLONASE) 50 MCG/ACT nasal spray Place 2 sprays into both nostrils as needed for allergies or rhinitis. 02/11/20   [name], [name], MD  furosemide (LASIX) 40 MG tablet Take 1 tablet (40 mg total) by mouth daily. 11/17/20   [name], [name], MD  lamoTRIgine (LAMICTAL) 100 MG tablet Take 2 pills in the morning(263m) and evening. May take an additional 1/2 pill(545m if needed with the morning and evening doses Patient taking differently: Take 2 pills in the evening. May take an additional 1/2 pill(5048mif needed with the morning and evening doses 10/20/20   AheMelvenia BeamD  Multiple Vitamin (MULTIVITAMIN ADULT PO) Take 1 tablet by mouth.    [provider]  omeprazole (PRILOSEC) 20 MG capsule Take 1 capsule (20 mg total) by mouth daily. 01/12/21   [name], WanStandley BrookingD  ondansetron (ZOFRAN-ODT) 4 MG disintegrating tablet Take 1-2 tablets (4-8 mg total) by mouth every 8 (eight) hours as needed for nausea. 09/04/19   AheMelvenia BeamD  OVER THE COUNTER MEDICATION OTC eye drops    [provider]  Oxcarbazepine (TRILEPTAL) 300 MG tablet Take 0.5 tablets (150 mg total) by mouth 2 (two) times daily. Patient taking differently: Take 150 mg by mouth 3 (three) times daily. 11/25/20   AheMelvenia BeamD     Positive ROS: Otherwise negative  All other systems have been reviewed and were otherwise negative with the exception of those mentioned in the HPI and as above.  Physical Exam: Constitutional: Alert, well-appearing, no acute distress Ears: External ears without lesions or tenderness. Ear canals are clear bilaterally with intact, clear TMs.  Nasal: External nose without lesions.. Clear nasal passages Oral: Lips  and gums without lesions. Tongue and palate mucosa without lesions. Posterior oropharynx clear.  Indirect laryngoscopy revealed a clear base of tongue vallecula and epiglottis.  Vocal cords were clear bilaterally with normal vocal cord mobility.  However patient had fullness in the left piriform sinus area.  Cannot identify definite ulcer. Neck: Patient has some fullness along palpation of the left thyroid left hypopharyngeal area but no significant enlarged lymph nodes along the jugular chain lymph nodes on the left. Respiratory: Breathing comfortably  Skin: No facial/neck lesions or rash noted.  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  Plan: After discussion with the patient and her husband we will plan on performing direct laryngoscopy and biopsy this Friday.   ChrRadene JourneyD   CC:

## 2021-02-09 ENCOUNTER — Encounter (HOSPITAL_BASED_OUTPATIENT_CLINIC_OR_DEPARTMENT_OTHER): Payer: Self-pay | Admitting: Otolaryngology

## 2021-02-09 ENCOUNTER — Other Ambulatory Visit: Payer: Self-pay

## 2021-02-10 ENCOUNTER — Ambulatory Visit (INDEPENDENT_AMBULATORY_CARE_PROVIDER_SITE_OTHER): Payer: Self-pay | Admitting: Otolaryngology

## 2021-02-10 DIAGNOSIS — R1314 Dysphagia, pharyngoesophageal phase: Secondary | ICD-10-CM

## 2021-02-10 NOTE — H&P (Signed)
PREOPERATIVE H&P  Chief Complaint: Dysphagia  HPI: [name] is a 80 y.o. female who presents for evaluation of dysphagia.  Patient was referred by Dr. Carlean Purl because of proximal esophageal mass as he was unable to pass his esophagoscope.  Subsequent CT scan demonstrated a proximal esophageal mass consistent with probable malignancy.  He was unable to obtain a biopsy and referred her to me to perform direct laryngoscopy and/or esophagoscopy with biopsy.  Past Medical History:  Diagnosis Date   Abdominal pain 05/29/2013   s/p rx of cephalo resistant e coli   but last rx NG  now residular ?  bladder sx repeat cx sx rx to ty and uro consult    ADJ DISORDER WITH MIXED ANXIETY & DEPRESSED MOOD 03/03/2010   Qualifier: Diagnosis of  By: [name], [name]    Agent resistant to multiple antibiotics 05/29/2013   e coli   bu NG on fu.     Anxiety    ARF (acute renal failure) ([place]) 03/12/2015   Closed head injury 02/01/2011   from syncope and had scalp laceration  neg ct .     Closed head injury 5-6 yrs ago   Colitis 22/29/7989   Complication of anesthesia    migraine several hours after general anesthesia   Depression    Fatty liver    Gall stones 2016   see ct scan neg HIDA    GERD (gastroesophageal reflux disease)    Hearing aid worn    HOH (hard of hearing)    both ears   Hyperlipidemia    Hypertension    echo nl lv function  mild dilitation 2009   Kidney infection    few yrs ago in hospital   Medication side effect 09/02/2010   Poss muscle se of 10 crestor    Migraine    hypnic HA eval by Dr. 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    Pyelonephritis 03/12/2015   Sensation of pain in anesthetized distribution of trigeminal nerve    Syncope 02/01/2011   In shower on vacation  sustained head laceration  8 sutures Had ed visit neg head ct labs and x ray    Trigeminal neuralgia pain    Past Surgical History:  Procedure Laterality Date    ABDOMINAL HYSTERECTOMY  2002   tubal   BACK SURGERY     2 times, for sciatic nerve pain   CARDIAC CATHETERIZATION  2000   chest pains neg   CHOLECYSTECTOMY N/A 02/21/2017   Procedure: LAPAROSCOPIC CHOLECYSTECTOMY WITH INTRAOPERATIVE CHOLANGIOGRAM;  Surgeon: Armandina Gemma, MD;  Location: [facility];  Service: General;  Laterality: N/A;   COLONOSCOPY     multiple   CRANIOTOMY  12/09/2011   nerve decompression right trigeminal    DOPPLER ECHOCARDIOGRAPHY  2009   nl lv function mild lv dilitation   EYE SURGERY Bilateral    ioc for catatracts   laparoscopic gallbladder surgery  02/16/2017   Fax from [place] Bilateral 2002   rt shoulder surgery     Social History   Socioeconomic History   Marital status: Married    Spouse name: Not on file   Number of children: 2   Years of education: Not on file   Highest education level: Not on file  Occupational History    Comment: retired Forensic psychologist  Tobacco Use   Smoking status: Never   Smokeless tobacco:  Never  Vaping Use   Vaping Use: Never used  Substance and Sexual Activity   Alcohol use: Yes    Alcohol/week: 2.0 standard drinks    Types: 2 Glasses of wine per week    Comment: occ wine   Drug use: No   Sexual activity: Not on file  Other Topics Concern   Not on file  Social History Narrative   Married   HH of 2-3 (god daughter)   Pets 2 dogs   Non smoker    Child is a physician   G2P2      Caffeine: 2 cups/day   Social Determinants of Radio broadcast assistant Strain: Low Risk    Difficulty of Paying Living Expenses: Not hard at all  Food Insecurity: No Food Insecurity   Worried About Charity fundraiser in the Last Year: Never true   [place] in the Last Year: Never true  Transportation Needs: No Transportation Needs   Lack of Transportation (Medical): No   Lack of Transportation (Non-Medical): No  Physical Activity: Inactive   Days of Exercise per Week: 0 days   Minutes  of Exercise per Session: 0 min  Stress: No Stress Concern Present   Feeling of Stress : Not at all  Social Connections: Moderately Integrated   Frequency of Communication with Friends and Family: More than three times a week   Frequency of Social Gatherings with Friends and Family: Once a week   Attends Religious Services: 1 to 4 times per year   Active Member of [employer] or Organizations: No   Attends Music therapist: Never   Marital Status: Married   Family History  Problem Relation Age of Onset   Ovarian cancer Mother    Stroke Mother    Alcohol abuse Father    Stroke Father    Diabetes Brother    Cancer Paternal Aunt        leukemia, unknown type   Seizures Daughter    Hypertension Other    Colon cancer Neg Hx    Dementia Neg Hx    Alzheimer's disease Neg Hx    Allergies  Allergen Reactions   Elemental Sulfur Hives and Itching   Hydrocodone Other (See Comments)    Rebound headaches   Sulfamethoxazole-Trimethoprim Other (See Comments)    REACTION: unspecified   Prior to Admission medications   Medication Sig Start Date End Date Taking? Authorizing Provider  Acetaminophen (TYLENOL PO) Take 2 tablets by mouth as needed.    [provider]  alendronate (FOSAMAX) 70 MG tablet TAKE 1 TABLET BY MOUTH 1 TIME A WEEK WITH FULL GLASS OF WATER AND ON AN EMPTY STOMACH AS DIRECTED Patient not taking: Reported on 02/04/2021 10/26/20   [name], [name], MD  benazepril (LOTENSIN) 20 MG tablet Take 1 tablet (20 mg total) by mouth 2 (two) times daily. 11/02/20   [name], [name], MD  Calcium Carb-Cholecalciferol (CALCIUM 600+D) 600-800 MG-UNIT TABS Take 1 tablet by mouth 2 (two) times daily.    [provider]  carvedilol (COREG) 25 MG tablet TAKE 1 TABLET(25 MG) BY MOUTH TWICE DAILY WITH A MEAL 12/28/20   [name], [name], MD  conjugated estrogens (PREMARIN) vaginal cream 2 (two) times a week.    [provider]  fluticasone (FLONASE) 50 MCG/ACT nasal spray  Place 2 sprays into both nostrils as needed for allergies or rhinitis. 02/11/20   [name], [name], MD  furosemide (LASIX) 40 MG tablet Take  1 tablet (40 mg total) by mouth daily. 11/17/20   [name], [name], MD  lamoTRIgine (LAMICTAL) 100 MG tablet Take 2 pills in the morning(24m) and evening. May take an additional 1/2 pill(51m if needed with the morning and evening doses Patient taking differently: Take 2 pills in the evening. May take an additional 1/2 pill(5064mif needed with the morning and evening doses 10/20/20   AheMelvenia BeamD  Multiple Vitamin (MULTIVITAMIN ADULT PO) Take 1 tablet by mouth.    [provider]  omeprazole (PRILOSEC) 20 MG capsule Take 1 capsule (20 mg total) by mouth daily. 01/12/21   [name], WanStandley BrookingD  ondansetron (ZOFRAN-ODT) 4 MG disintegrating tablet Take 1-2 tablets (4-8 mg total) by mouth every 8 (eight) hours as needed for nausea. 09/04/19   AheMelvenia BeamD  OVER THE COUNTER MEDICATION OTC eye drops    [provider]  Oxcarbazepine (TRILEPTAL) 300 MG tablet Take 0.5 tablets (150 mg total) by mouth 2 (two) times daily. 11/25/20   AheMelvenia BeamD     Positive ROS: Otherwise negative  All other systems have been reviewed and were otherwise negative with the exception of those mentioned in the HPI and as above.  Physical Exam: There were no vitals filed for this visit.  General: Alert, no acute distress no airway problems.  Normal voice. Oral: Normal oral mucosa and tonsils Nasal: Clear nasal passages On fiberoptic laryngoscopy patient has fullness of the left piriform sinus but cannot visualize any gross ulceration.  Vocal cords were clear with normal vocal mobility. Neck: No palpable adenopathy or thyroid nodules.  She has palpable fullness in the left lower parapharyngeal region. Ear: Ear canal is clear with normal appearing TMs Cardiovascular: Regular rate and rhythm, no murmur.  Respiratory: Clear to  auscultation Neurologic: Alert and oriented x 3   Assessment/Plan: Dysphagia. Proximal esophageal mass suspicious for malignancy on CT scan.  Plan for direct laryngoscopy and possible esophagoscopy with biopsy.   ChrMelony OverlyD 02/10/2021 8:24 AM

## 2021-02-10 NOTE — H&P (View-Only) (Signed)
PREOPERATIVE H&P  Chief Complaint: Dysphagia  HPI: [name] is a 80 y.o. female who presents for evaluation of dysphagia.  Patient was referred by Dr. Carlean Purl because of proximal esophageal mass as he was unable to pass his esophagoscope.  Subsequent CT scan demonstrated a proximal esophageal mass consistent with probable malignancy.  He was unable to obtain a biopsy and referred her to me to perform direct laryngoscopy and/or esophagoscopy with biopsy.  Past Medical History:  Diagnosis Date   Abdominal pain 05/29/2013   s/p rx of cephalo resistant e coli   but last rx NG  now residular ?  bladder sx repeat cx sx rx to ty and uro consult    ADJ DISORDER WITH MIXED ANXIETY & DEPRESSED MOOD 03/03/2010   Qualifier: Diagnosis of  By: [name], [name]    Agent resistant to multiple antibiotics 05/29/2013   e coli   bu NG on fu.     Anxiety    ARF (acute renal failure) ([place]) 03/12/2015   Closed head injury 02/01/2011   from syncope and had scalp laceration  neg ct .     Closed head injury 5-6 yrs ago   Colitis 33/54/5625   Complication of anesthesia    migraine several hours after general anesthesia   Depression    Fatty liver    Gall stones 2016   see ct scan neg HIDA    GERD (gastroesophageal reflux disease)    Hearing aid worn    HOH (hard of hearing)    both ears   Hyperlipidemia    Hypertension    echo nl lv function  mild dilitation 2009   Kidney infection    few yrs ago in hospital   Medication side effect 09/02/2010   Poss muscle se of 10 crestor    Migraine    hypnic HA eval by Dr. 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    Pyelonephritis 03/12/2015   Sensation of pain in anesthetized distribution of trigeminal nerve    Syncope 02/01/2011   In shower on vacation  sustained head laceration  8 sutures Had ed visit neg head ct labs and x ray    Trigeminal neuralgia pain    Past Surgical History:  Procedure Laterality Date    ABDOMINAL HYSTERECTOMY  2002   tubal   BACK SURGERY     2 times, for sciatic nerve pain   CARDIAC CATHETERIZATION  2000   chest pains neg   CHOLECYSTECTOMY N/A 02/21/2017   Procedure: LAPAROSCOPIC CHOLECYSTECTOMY WITH INTRAOPERATIVE CHOLANGIOGRAM;  Surgeon: Armandina Gemma, MD;  Location: [facility];  Service: General;  Laterality: N/A;   COLONOSCOPY     multiple   CRANIOTOMY  12/09/2011   nerve decompression right trigeminal    DOPPLER ECHOCARDIOGRAPHY  2009   nl lv function mild lv dilitation   EYE SURGERY Bilateral    ioc for catatracts   laparoscopic gallbladder surgery  02/16/2017   Fax from [facility] Bilateral 2002   rt shoulder surgery     Social History   Socioeconomic History   Marital status: Married    Spouse name: Not on file   Number of children: 2   Years of education: Not on file   Highest education level: Not on file  Occupational History    Comment: retired Forensic psychologist  Tobacco Use   Smoking status: Never   Smokeless tobacco:  Never  Vaping Use   Vaping Use: Never used  Substance and Sexual Activity   Alcohol use: Yes    Alcohol/week: 2.0 standard drinks    Types: 2 Glasses of wine per week    Comment: occ wine   Drug use: No   Sexual activity: Not on file  Other Topics Concern   Not on file  Social History Narrative   Married   HH of 2-3 (god daughter)   Pets 2 dogs   Non smoker    Child is a physician   G2P2      Caffeine: 2 cups/day   Social Determinants of Radio broadcast assistant Strain: Low Risk    Difficulty of Paying Living Expenses: Not hard at all  Food Insecurity: No Food Insecurity   Worried About Charity fundraiser in the Last Year: Never true   [place] in the Last Year: Never true  Transportation Needs: No Transportation Needs   Lack of Transportation (Medical): No   Lack of Transportation (Non-Medical): No  Physical Activity: Inactive   Days of Exercise per Week: 0 days   Minutes  of Exercise per Session: 0 min  Stress: No Stress Concern Present   Feeling of Stress : Not at all  Social Connections: Moderately Integrated   Frequency of Communication with Friends and Family: More than three times a week   Frequency of Social Gatherings with Friends and Family: Once a week   Attends Religious Services: 1 to 4 times per year   Active Member of [employer] or Organizations: No   Attends Music therapist: Never   Marital Status: Married   Family History  Problem Relation Age of Onset   Ovarian cancer Mother    Stroke Mother    Alcohol abuse Father    Stroke Father    Diabetes Brother    Cancer Paternal Aunt        leukemia, unknown type   Seizures Daughter    Hypertension Other    Colon cancer Neg Hx    Dementia Neg Hx    Alzheimer's disease Neg Hx    Allergies  Allergen Reactions   Elemental Sulfur Hives and Itching   Hydrocodone Other (See Comments)    Rebound headaches   Sulfamethoxazole-Trimethoprim Other (See Comments)    REACTION: unspecified   Prior to Admission medications   Medication Sig Start Date End Date Taking? Authorizing Provider  Acetaminophen (TYLENOL PO) Take 2 tablets by mouth as needed.    [provider]  alendronate (FOSAMAX) 70 MG tablet TAKE 1 TABLET BY MOUTH 1 TIME A WEEK WITH FULL GLASS OF WATER AND ON AN EMPTY STOMACH AS DIRECTED Patient not taking: Reported on 02/04/2021 10/26/20   [name], [name], MD  benazepril (LOTENSIN) 20 MG tablet Take 1 tablet (20 mg total) by mouth 2 (two) times daily. 11/02/20   [name], [name], MD  Calcium Carb-Cholecalciferol (CALCIUM 600+D) 600-800 MG-UNIT TABS Take 1 tablet by mouth 2 (two) times daily.    [provider]  carvedilol (COREG) 25 MG tablet TAKE 1 TABLET(25 MG) BY MOUTH TWICE DAILY WITH A MEAL 12/28/20   [name], [name], MD  conjugated estrogens (PREMARIN) vaginal cream 2 (two) times a week.    [provider]  fluticasone (FLONASE) 50 MCG/ACT nasal spray  Place 2 sprays into both nostrils as needed for allergies or rhinitis. 02/11/20   [name], [name], MD  furosemide (LASIX) 40 MG tablet Take  1 tablet (40 mg total) by mouth daily. 11/17/20   [name], [name], MD  lamoTRIgine (LAMICTAL) 100 MG tablet Take 2 pills in the morning(228m) and evening. May take an additional 1/2 pill(538m if needed with the morning and evening doses Patient taking differently: Take 2 pills in the evening. May take an additional 1/2 pill(5059mif needed with the morning and evening doses 10/20/20   AheMelvenia BeamD  Multiple Vitamin (MULTIVITAMIN ADULT PO) Take 1 tablet by mouth.    [provider]  omeprazole (PRILOSEC) 20 MG capsule Take 1 capsule (20 mg total) by mouth daily. 01/12/21   [name], WanStandley BrookingD  ondansetron (ZOFRAN-ODT) 4 MG disintegrating tablet Take 1-2 tablets (4-8 mg total) by mouth every 8 (eight) hours as needed for nausea. 09/04/19   AheMelvenia BeamD  OVER THE COUNTER MEDICATION OTC eye drops    [provider]  Oxcarbazepine (TRILEPTAL) 300 MG tablet Take 0.5 tablets (150 mg total) by mouth 2 (two) times daily. 11/25/20   AheMelvenia BeamD     Positive ROS: Otherwise negative  All other systems have been reviewed and were otherwise negative with the exception of those mentioned in the HPI and as above.  Physical Exam: There were no vitals filed for this visit.  General: Alert, no acute distress no airway problems.  Normal voice. Oral: Normal oral mucosa and tonsils Nasal: Clear nasal passages On fiberoptic laryngoscopy patient has fullness of the left piriform sinus but cannot visualize any gross ulceration.  Vocal cords were clear with normal vocal mobility. Neck: No palpable adenopathy or thyroid nodules.  She has palpable fullness in the left lower parapharyngeal region. Ear: Ear canal is clear with normal appearing TMs Cardiovascular: Regular rate and rhythm, no murmur.  Respiratory: Clear to  auscultation Neurologic: Alert and oriented x 3   Assessment/Plan: Dysphagia. Proximal esophageal mass suspicious for malignancy on CT scan.  Plan for direct laryngoscopy and possible esophagoscopy with biopsy.   ChrMelony OverlyD 02/10/2021 8:24 AM

## 2021-02-12 ENCOUNTER — Other Ambulatory Visit: Payer: Self-pay

## 2021-02-12 ENCOUNTER — Encounter (HOSPITAL_BASED_OUTPATIENT_CLINIC_OR_DEPARTMENT_OTHER)
Admission: RE | Disposition: A | Discharge status: Home / Self Care | Payer: Self-pay | Source: Home / Self Care | Attending: Otolaryngology

## 2021-02-12 ENCOUNTER — Ambulatory Visit (HOSPITAL_BASED_OUTPATIENT_CLINIC_OR_DEPARTMENT_OTHER): Payer: Medicare Other | Admitting: Anesthesiology

## 2021-02-12 ENCOUNTER — Ambulatory Visit (HOSPITAL_BASED_OUTPATIENT_CLINIC_OR_DEPARTMENT_OTHER)
Admission: RE | Admit: 2021-02-12 | Discharge: 2021-02-12 | Disposition: A | Discharge status: Home / Self Care | Payer: Medicare Other | Attending: Otolaryngology | Admitting: Otolaryngology

## 2021-02-12 ENCOUNTER — Encounter (HOSPITAL_BASED_OUTPATIENT_CLINIC_OR_DEPARTMENT_OTHER): Payer: Self-pay | Admitting: Otolaryngology

## 2021-02-12 DIAGNOSIS — Z8619 Personal history of other infectious and parasitic diseases: Secondary | ICD-10-CM | POA: Diagnosis not present

## 2021-02-12 DIAGNOSIS — I10 Essential (primary) hypertension: Secondary | ICD-10-CM | POA: Diagnosis not present

## 2021-02-12 DIAGNOSIS — C760 Malignant neoplasm of head, face and neck: Secondary | ICD-10-CM | POA: Diagnosis not present

## 2021-02-12 DIAGNOSIS — R1310 Dysphagia, unspecified: Secondary | ICD-10-CM | POA: Diagnosis not present

## 2021-02-12 DIAGNOSIS — Z9049 Acquired absence of other specified parts of digestive tract: Secondary | ICD-10-CM | POA: Diagnosis not present

## 2021-02-12 DIAGNOSIS — E785 Hyperlipidemia, unspecified: Secondary | ICD-10-CM | POA: Insufficient documentation

## 2021-02-12 DIAGNOSIS — C153 Malignant neoplasm of upper third of esophagus: Secondary | ICD-10-CM | POA: Insufficient documentation

## 2021-02-12 DIAGNOSIS — K760 Fatty (change of) liver, not elsewhere classified: Secondary | ICD-10-CM | POA: Insufficient documentation

## 2021-02-12 DIAGNOSIS — Z881 Allergy status to other antibiotic agents status: Secondary | ICD-10-CM | POA: Insufficient documentation

## 2021-02-12 DIAGNOSIS — R1314 Dysphagia, pharyngoesophageal phase: Secondary | ICD-10-CM

## 2021-02-12 DIAGNOSIS — N179 Acute kidney failure, unspecified: Secondary | ICD-10-CM | POA: Diagnosis not present

## 2021-02-12 DIAGNOSIS — Z90722 Acquired absence of ovaries, bilateral: Secondary | ICD-10-CM | POA: Diagnosis not present

## 2021-02-12 DIAGNOSIS — R933 Abnormal findings on diagnostic imaging of other parts of digestive tract: Secondary | ICD-10-CM

## 2021-02-12 DIAGNOSIS — K229 Disease of esophagus, unspecified: Secondary | ICD-10-CM | POA: Insufficient documentation

## 2021-02-12 DIAGNOSIS — Z90710 Acquired absence of both cervix and uterus: Secondary | ICD-10-CM | POA: Diagnosis not present

## 2021-02-12 DIAGNOSIS — K219 Gastro-esophageal reflux disease without esophagitis: Secondary | ICD-10-CM | POA: Diagnosis not present

## 2021-02-12 DIAGNOSIS — Z8041 Family history of malignant neoplasm of ovary: Secondary | ICD-10-CM | POA: Insufficient documentation

## 2021-02-12 DIAGNOSIS — Z885 Allergy status to narcotic agent status: Secondary | ICD-10-CM | POA: Insufficient documentation

## 2021-02-12 DIAGNOSIS — D3705 Neoplasm of uncertain behavior of pharynx: Secondary | ICD-10-CM | POA: Diagnosis not present

## 2021-02-12 DIAGNOSIS — Z8249 Family history of ischemic heart disease and other diseases of the circulatory system: Secondary | ICD-10-CM | POA: Diagnosis not present

## 2021-02-12 DIAGNOSIS — Z806 Family history of leukemia: Secondary | ICD-10-CM | POA: Diagnosis not present

## 2021-02-12 HISTORY — PX: DIRECT LARYNGOSCOPY: SHX5326

## 2021-02-12 HISTORY — DX: Anxiety disorder, unspecified: F41.9

## 2021-02-12 HISTORY — DX: Depression, unspecified: F32.A

## 2021-02-12 SURGERY — LARYNGOSCOPY, DIRECT
Anesthesia: General

## 2021-02-12 MED ORDER — DEXAMETHASONE SODIUM PHOSPHATE 4 MG/ML IJ SOLN
INTRAMUSCULAR | Status: DC | PRN
  Administered 2021-02-12: 5 mg via INTRAVENOUS

## 2021-02-12 MED ORDER — CEFAZOLIN SODIUM-DEXTROSE 2-4 GM/100ML-% IV SOLN
INTRAVENOUS | Status: AC
  Filled 2021-02-12: qty 100

## 2021-02-12 MED ORDER — SUGAMMADEX SODIUM 200 MG/2ML IV SOLN
INTRAVENOUS | Status: DC | PRN
  Administered 2021-02-12: 200 mg via INTRAVENOUS

## 2021-02-12 MED ORDER — ACETAMINOPHEN 500 MG PO TABS
1000.0000 mg | ORAL_TABLET | Freq: Once | ORAL | Status: AC
  Administered 2021-02-12: 1000 mg via ORAL

## 2021-02-12 MED ORDER — PROPOFOL 10 MG/ML IV BOLUS
INTRAVENOUS | Status: DC | PRN
  Administered 2021-02-12: 100 mg via INTRAVENOUS

## 2021-02-12 MED ORDER — EPINEPHRINE PF 1 MG/ML IJ SOLN
INTRAMUSCULAR | Status: DC | PRN
  Administered 2021-02-12: 1 mg

## 2021-02-12 MED ORDER — CEFAZOLIN SODIUM-DEXTROSE 2-4 GM/100ML-% IV SOLN
2.0000 g | INTRAVENOUS | Status: AC
  Administered 2021-02-12: 2 g via INTRAVENOUS

## 2021-02-12 MED ORDER — LACTATED RINGERS IV SOLN: INTRAVENOUS | Status: DC

## 2021-02-12 MED ORDER — CHLORHEXIDINE GLUCONATE CLOTH 2 % EX PADS: 6.0000 | MEDICATED_PAD | Freq: Once | CUTANEOUS | Status: DC

## 2021-02-12 MED ORDER — LIDOCAINE HCL (CARDIAC) PF 100 MG/5ML IV SOSY
PREFILLED_SYRINGE | INTRAVENOUS | Status: DC | PRN
  Administered 2021-02-12: 20 mg via INTRAVENOUS

## 2021-02-12 MED ORDER — ACETAMINOPHEN 500 MG PO TABS
ORAL_TABLET | ORAL | Status: AC
  Filled 2021-02-12: qty 2

## 2021-02-12 MED ORDER — ONDANSETRON HCL 4 MG/2ML IJ SOLN
INTRAMUSCULAR | Status: AC
  Filled 2021-02-12: qty 2

## 2021-02-12 MED ORDER — EPHEDRINE SULFATE 50 MG/ML IJ SOLN
INTRAMUSCULAR | Status: DC | PRN
Start: 2021-02-12 — End: 2021-02-12
  Administered 2021-02-12: 10 mg via INTRAVENOUS

## 2021-02-12 MED ORDER — DEXAMETHASONE SODIUM PHOSPHATE 10 MG/ML IJ SOLN
INTRAMUSCULAR | Status: AC
  Filled 2021-02-12: qty 1

## 2021-02-12 MED ORDER — EPINEPHRINE PF 1 MG/ML IJ SOLN
INTRAMUSCULAR | Status: AC
  Filled 2021-02-12: qty 1

## 2021-02-12 MED ORDER — FENTANYL CITRATE (PF) 100 MCG/2ML IJ SOLN
INTRAMUSCULAR | Status: DC | PRN
  Administered 2021-02-12: 25 ug via INTRAVENOUS
  Administered 2021-02-12: 50 ug via INTRAVENOUS

## 2021-02-12 MED ORDER — LIDOCAINE HCL (PF) 2 % IJ SOLN
INTRAMUSCULAR | Status: AC
  Filled 2021-02-12: qty 5

## 2021-02-12 MED ORDER — FENTANYL CITRATE (PF) 100 MCG/2ML IJ SOLN: 25.0000 ug | INTRAMUSCULAR | Status: DC | PRN

## 2021-02-12 MED ORDER — ROCURONIUM BROMIDE 100 MG/10ML IV SOLN
INTRAVENOUS | Status: DC | PRN
  Administered 2021-02-12: 40 mg via INTRAVENOUS

## 2021-02-12 MED ORDER — FENTANYL CITRATE (PF) 100 MCG/2ML IJ SOLN
INTRAMUSCULAR | Status: AC
  Filled 2021-02-12: qty 2

## 2021-02-12 MED ORDER — PROPOFOL 10 MG/ML IV BOLUS
INTRAVENOUS | Status: AC
  Filled 2021-02-12: qty 20

## 2021-02-12 SURGICAL SUPPLY — 28 items
CANISTER SUCT 1200ML W/VALVE (MISCELLANEOUS) ×2 IMPLANT
CNTNR URN SCR LID CUP LEK RST (MISCELLANEOUS) IMPLANT
CONT SPEC 4OZ STRL OR WHT (MISCELLANEOUS)
DEFOGGER MIRROR 1QT (MISCELLANEOUS) IMPLANT
GAUZE SPONGE 4X4 12PLY STRL LF (GAUZE/BANDAGES/DRESSINGS) ×4 IMPLANT
GLOVE SURG MICRO LTX SZ7.5 (GLOVE) ×2 IMPLANT
GOWN STRL REUS W/ TWL LRG LVL3 (GOWN DISPOSABLE) IMPLANT
GOWN STRL REUS W/ TWL XL LVL3 (GOWN DISPOSABLE) IMPLANT
GOWN STRL REUS W/TWL LRG LVL3 (GOWN DISPOSABLE)
GOWN STRL REUS W/TWL XL LVL3 (GOWN DISPOSABLE)
GUARD TEETH (MISCELLANEOUS) ×2 IMPLANT
MARKER SKIN DUAL TIP RULER LAB (MISCELLANEOUS) IMPLANT
NDL SAFETY ECLIPSE 18X1.5 (NEEDLE) ×1 IMPLANT
NDL SPNL 22GX7 QUINCKE BK (NEEDLE) IMPLANT
NEEDLE HYPO 18GX1.5 SHARP (NEEDLE) ×2
NEEDLE SPNL 22GX7 QUINCKE BK (NEEDLE) IMPLANT
NS IRRIG 1000ML POUR BTL (IV SOLUTION) ×2 IMPLANT
PACK BASIN DAY SURGERY FS (CUSTOM PROCEDURE TRAY) ×2 IMPLANT
PATTIES SURGICAL .5 X3 (DISPOSABLE) ×2 IMPLANT
SHEET MEDIUM DRAPE 40X70 STRL (DRAPES) ×2 IMPLANT
SLEEVE SCD COMPRESS KNEE MED (STOCKING) ×2 IMPLANT
SOL ANTI FOG 6CC (MISCELLANEOUS) IMPLANT
SOLUTION ANTI FOG 6CC (MISCELLANEOUS)
SURGILUBE 2OZ TUBE FLIPTOP (MISCELLANEOUS) ×2 IMPLANT
SYR 5ML LL (SYRINGE) ×2 IMPLANT
SYR CONTROL 10ML LL (SYRINGE) IMPLANT
TOWEL GREEN STERILE FF (TOWEL DISPOSABLE) ×2 IMPLANT
TUBE CONNECTING 20X1/4 (TUBING) ×2 IMPLANT

## 2021-02-12 NOTE — Brief Op Note (Signed)
02/12/2021  9:57 AM  PATIENT:  [name]  80 y.o. female  PRE-OPERATIVE DIAGNOSIS:  DYSPHAGIA  POST-OPERATIVE DIAGNOSIS:  DYSPHAGIA  PROCEDURE:  Procedure(s): DIRECT LARYNGOSCOPY AND BIOPSY POSSIBLE FROZEN (N/A)  SURGEON:  Surgeon(s) and Role:    [name], MD - Primary  PHYSICIAN ASSISTANT:   ASSISTANTS: none   ANESTHESIA:   general  EBL:  minimal   BLOOD ADMINISTERED:none  DRAINS: none   LOCAL MEDICATIONS USED:  NONE  SPECIMEN:  Source of Specimen:  upper cervical esophagus  DISPOSITION OF SPECIMEN:  PATHOLOGY  COUNTS:  YES  TOURNIQUET:  * No tourniquets in log *  DICTATION: .Other Dictation: Dictation Number [id number]  PLAN OF CARE: Discharge to home after PACU  PATIENT DISPOSITION:  PACU - hemodynamically stable.   Delay start of Pharmacological VTE agent (>24hrs) due to surgical blood loss or risk of bleeding: yes

## 2021-02-12 NOTE — Op Note (Signed)
[name], [name] MEDICAL RECORD NO: [id number] ACCOUNT NO: [id number] DATE OF BIRTH: Jun 23, 1940 FACILITY: [facility] LOCATION: [facility] PHYSICIAN: Leonides Sake. Lucia Gaskins, MD  Operative Report   DATE OF PROCEDURE: 02/12/2021  PREOPERATIVE DIAGNOSIS:  Dysphagia with upper cervical esophageal mass noted on CT scan.  POSTOPERATIVE DIAGNOSES:  Dysphagia with upper cervical esophageal mass noted on CT scan.  OPERATION PERFORMED: Direct laryngoscopy and biopsy.  SURGEON:  Dr. Melony Overly.  ANESTHESIA:  General endotracheal.  BLOOD LOSS:  Minimal.  COMPLICATIONS:  None.  BRIEF CLINICAL NOTE: The patient is an 80 year old female who has had persistent weight loss over the past 6 months with difficulty swallowing.  She was seen by Dr. Carlean Purl who attempted upper GI endoscopy and was unable to pass the scope.  Subsequent CT  scan was performed.  It demonstrated a large upper cervical esophageal mass at the upper GI junction.  It extends up into the hypopharyngeal area.  She was referred to me for direct laryngoscopy and biopsy.  On review of the CT scan, she has a mass that  is consistent with probable malignancy as well as some surrounding necrotic nodes.  DESCRIPTION OF PROCEDURE:  After adequate endotracheal anesthesia, the patient had no airway problems or no hoarseness.  Direct laryngoscopy was performed. On direct laryngoscopy, the base of tongue, vallecula, epiglottis were normal.  The vocal cords  were clear bilaterally.  The right piriform sinus was relatively clear, but she had fullness involving the posterior arytenoid area and the inferior aspect of the left piriform sinus.  The opening of the esophagus could be visualized, but was very  irregular and ulcerative.  Several biopsies were obtained and sent for frozen section.  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  This completed the procedure.  The patient was awoken from anesthesia and  transferred to  recovery room postop doing well.  DISPOSITION:  The patient was instructed to use throat lozenges for sore throat and drink plenty of liquids as she has difficulty swallowing solid foods.  She will be referred back to Dr. Carlean Purl in Oncology for further evaluation and treatment.   MUK D: 02/12/2021 10:03:53 am T: 02/12/2021 10:12:00 am  JOB: [id number]/ [id number]

## 2021-02-12 NOTE — Anesthesia Procedure Notes (Signed)
Procedure Name: Intubation Date/Time: 02/12/2021 9:11 AM Performed by: Maryella Shivers, CRNA Pre-anesthesia Checklist: Patient identified, Emergency Drugs available, Suction available and Patient being monitored Patient Re-evaluated:Patient Re-evaluated prior to induction Oxygen Delivery Method: Circle system utilized Preoxygenation: Pre-oxygenation with 100% oxygen Induction Type: IV induction Ventilation: Mask ventilation without difficulty Laryngoscope Size: Mac and 3 Grade View: Grade II Tube type: Oral Tube size: 6.0 mm Number of attempts: 1 Airway Equipment and Method: Stylet and Oral airway Placement Confirmation: ETT inserted through vocal cords under direct vision, positive ETCO2 and breath sounds checked- equal and bilateral Secured at: 20 cm Tube secured with: Tape Dental Injury: Teeth and Oropharynx as per pre-operative assessment

## 2021-02-12 NOTE — Interval H&P Note (Signed)
History and Physical Interval Note:  02/12/2021 8:25 AM  [name]  has presented today for surgery, with the diagnosis of DYSPHAGIA.  The various methods of treatment have been discussed with the patient and family. After consideration of risks, benefits and other options for treatment, the patient has consented to  Procedure(s): DIRECT LARYNGOSCOPY AND BIOPSY POSSIBLE FROZEN (N/A) POSSIBLE ESOPHAGOSCOPY (N/A) as a surgical intervention.  The patient's history has been reviewed, patient examined, no change in status, stable for surgery.  I have reviewed the patient's chart and labs.  Questions were answered to the patient's satisfaction.     Melony Overly

## 2021-02-12 NOTE — Anesthesia Preprocedure Evaluation (Addendum)
Anesthesia Evaluation  Patient identified by MRN, date of birth, ID band Patient awake    Reviewed: Allergy & Precautions, NPO status , Patient's Chart, lab work & pertinent test results  History of Anesthesia Complications Negative for: history of anesthetic complications  Airway Mallampati: III  TM Distance: >3 FB Neck ROM: Full    Dental  (+) Dental Advisory Given, Caps   Pulmonary neg pulmonary ROS,    breath sounds clear to auscultation       Cardiovascular hypertension, Pt. on medications (-) angina Rhythm:Regular Rate:Normal  '21 ECHO: EF 55-60%. LV has normal function, no regional wall motion abnormalities. 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 Grade II diastolic dysfunction, trivial MR   Neuro/Psych  Headaches, Anxiety Depression Trigeminal neuralgia    GI/Hepatic Neg liver ROS, GERD  Controlled,  Endo/Other  negative endocrine ROS  Renal/GU negative Renal ROS     Musculoskeletal  (+) Arthritis ,   Abdominal   Peds  Hematology negative hematology ROS (+)   Anesthesia Other Findings   Reproductive/Obstetrics                            Anesthesia Physical Anesthesia Plan  ASA: 2  Anesthesia Plan: General   Post-op Pain Management:    Induction: Intravenous  PONV Risk Score and Plan: 3 and Ondansetron, Dexamethasone and Treatment may vary due to age or medical condition  Airway Management Planned: LMA  Additional Equipment: None  Intra-op Plan:   Post-operative Plan: Extubation in OR  Informed Consent: I have reviewed the patients History and Physical, chart, labs and discussed the procedure including the risks, benefits and alternatives for the proposed anesthesia with the patient or authorized representative who has indicated his/her understanding and acceptance.     Dental advisory given  Plan Discussed with: CRNA  and Surgeon  Anesthesia Plan Comments:        Anesthesia Quick Evaluation

## 2021-02-12 NOTE — Transfer of Care (Signed)
Immediate Anesthesia Transfer of Care Note  Patient: [name]  Procedure(s) Performed: DIRECT LARYNGOSCOPY AND BIOPSY POSSIBLE FROZEN  Patient Location: PACU  Anesthesia Type:General  Level of Consciousness: awake, alert  and oriented  Airway & Oxygen Therapy: Patient Spontanous Breathing and Patient connected to face mask oxygen  Post-op Assessment: Report given to RN and Post -op Vital signs reviewed and stable  Post vital signs: Reviewed and stable  Last Vitals:  Vitals Value Taken Time  BP 156/83 02/12/21 1015  Temp    Pulse 56 02/12/21 1019  Resp 12 02/12/21 1019  SpO2 100 % 02/12/21 1019  Vitals shown include unvalidated device data.  Last Pain:  Vitals:   02/12/21 0744  TempSrc: Oral  PainSc: 0-No pain      Patients Stated Pain Goal: 3 (03/79/44 4619)  Complications: No notable events documented.

## 2021-02-12 NOTE — Discharge Instructions (Addendum)
Take throat lozenges or spray for sore throat as needed as well as liquid Tylenol or liquid ibuprofen for discomfort. Drink liquid Ensure or similar high-calorie liquids to try to maintain weight. Follow-up with Dr. Carlean Purl concerning further treatment.  Next dose of Tylenol (liquid) after 2pm as needed for pain.   Post Anesthesia Home Care Instructions  Activity: Get plenty of rest for the remainder of the day. A responsible individual must stay with you for 24 hours following the procedure.  For the next 24 hours, DO NOT: -Drive a car -Paediatric nurse -Drink alcoholic beverages -Take any medication unless instructed by your physician -Make any legal decisions or sign important papers.  Meals: Start with liquid foods such as gelatin or soup. Progress to regular foods as tolerated. Avoid greasy, spicy, heavy foods. If nausea and/or vomiting occur, drink only clear liquids until the nausea and/or vomiting subsides. Call your physician if vomiting continues.  Special Instructions/Symptoms: Your throat may feel dry or sore from the anesthesia or the breathing tube placed in your throat during surgery. If this causes discomfort, gargle with warm salt water. The discomfort should disappear within 24 hours.  If you had a scopolamine patch placed behind your ear for the management of post- operative nausea and/or vomiting:  1. The medication in the patch is effective for 72 hours, after which it should be removed.  Wrap patch in a tissue and discard in the trash. Wash hands thoroughly with soap and water. 2. You may remove the patch earlier than 72 hours if you experience unpleasant side effects which may include dry mouth, dizziness or visual disturbances. 3. Avoid touching the patch. Wash your hands with soap and water after contact with the patch.

## 2021-02-12 NOTE — Anesthesia Postprocedure Evaluation (Signed)
Anesthesia Post Note  Patient: [name]  Procedure(s) Performed: DIRECT LARYNGOSCOPY AND BIOPSY POSSIBLE FROZEN     Patient location during evaluation: PACU Anesthesia Type: General Level of consciousness: awake and alert, patient cooperative and oriented Pain management: pain level controlled Vital Signs Assessment: post-procedure vital signs reviewed and stable Respiratory status: spontaneous breathing, nonlabored ventilation and respiratory function stable Cardiovascular status: blood pressure returned to baseline and stable Postop Assessment: no apparent nausea or vomiting and able to ambulate Anesthetic complications: no   No notable events documented.  Last Vitals:  Vitals:   02/12/21 1045 02/12/21 1130  BP: (!) 146/80 (!) 155/90  Pulse: (!) 58 86  Resp: 17 16  Temp:  (!) 36.4 C  SpO2: 95% 94%    Last Pain:  Vitals:   02/12/21 1130  TempSrc:   PainSc: 0-No pain                 Aury Scollard,E. Eithel Ryall

## 2021-02-15 ENCOUNTER — Telehealth: Payer: Self-pay | Admitting: Internal Medicine

## 2021-02-15 ENCOUNTER — Encounter: Payer: Self-pay | Admitting: *Deleted

## 2021-02-15 DIAGNOSIS — K2289 Other specified disease of esophagus: Secondary | ICD-10-CM

## 2021-02-15 DIAGNOSIS — R634 Abnormal weight loss: Secondary | ICD-10-CM

## 2021-02-15 DIAGNOSIS — C159 Malignant neoplasm of esophagus, unspecified: Secondary | ICD-10-CM

## 2021-02-15 LAB — SURGICAL PATHOLOGY

## 2021-02-15 NOTE — Progress Notes (Signed)
Called to speak to patient and schedule her new patient appointment with Dr Marin Olp. Patient lives much closer to [facility] and would like her referral forwarded to the main [facility].   Referral will be sent.   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

## 2021-02-15 NOTE — Telephone Encounter (Signed)
[name] told me pathology on bx from Dr. 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

## 2021-02-15 NOTE — Telephone Encounter (Signed)
Orders placed medical and rad oncology. Left message for patient to call back

## 2021-02-15 NOTE — Telephone Encounter (Signed)
Patient notified she will be contacted directly by oncology with appointments for radiation and medical oncology.    [name]

## 2021-02-16 ENCOUNTER — Telehealth: Payer: Self-pay | Admitting: Nurse Practitioner

## 2021-02-16 NOTE — Telephone Encounter (Signed)
Scheduled appt per 9/26 referral. Pt is aware of appt date and time.

## 2021-02-17 NOTE — Progress Notes (Signed)
GI Location of Tumor / Histology: Esophagus  [name] presented with complaints of difficulty swallowing foods.  [name] reports episodes of gagging, regurgitation, pain, and feeling like everything was getting stuck.  CT Soft Tissue Neck 02/05/2021: Mass-like soft tissue thickening and mucosal hyperenhancement of the cervical esophagus and hypopharynx, spanning the C4-T1 levels, likely reflecting an esophageal malignancy. This measures up to 2.4 x 3.4 cm in transaxial dimensions, and 6.3 cm in craniocaudal dimension. Associated severe effacement of the esophageal lumen.   Centrally necrotic right paratracheal lymph node beneath the level of the mass, measuring 1.3 x 0.9 cm, and likely reflecting a site of nodal metastatic disease.   Additional lymph nodes along the posterior and inferior aspect of the right thyroid lobe (along the right aspect of the esophageal mass), which measure subcentimeter but are asymmetrically prominent and highly suspicious for additional sites of nodal metastatic disease.   Upper Endoscopy 02/04/2021: One mass like severe stenosis was found in the upper third of the esophagus.  The stenosis was not traversed.  DG Esophagus 01/22/2021: Luminal narrowing in the high cervical esophagus over a 3 cm segment is highly concerning for esophageal neoplasm. Inflammatory process would be a secondary consideration. Luminal narrowing occurs approximately at the C5-C7 vertebral body level just below the glottis. Patient experienced several episodes of choking related to this luminal narrowing. Recommend expedient upper GI endoscopy for evaluation.  Biopsies of Upper Cervical Esophagus 02/12/2021    Past/Anticipated interventions by surgeon, if any:   Past/Anticipated interventions by medical oncology, if any:    Weight changes, if any: Lost about 10 pounds since the middle of August.   Bowel/Bladder complaints, if any: Has noted more constipation due to changes in  diet.  Denies bladder changes.  Nausea / Vomiting, if any: [name] notes occasional gagging on liquids.  Pain issues, if any: Back of her throat to the left side.  Diet: Drinking boost, about 4-6 per day.  [name] is unable to eat any solid foods.  [name] is able to do pureed foods through a straw.  SAFETY ISSUES: Prior radiation? Gamma knife for trigeminal Neuralgia 01/05/2021 85 Gy to the 100% isodose line using a total of 1 collimators with the 4 mm collimator. Pacemaker/ICD? No Possible current pregnancy? Hysterectomy Is the patient on methotrexate? No  Current Complaints/Details:

## 2021-02-18 ENCOUNTER — Inpatient Hospital Stay: Payer: Medicare Other | Attending: Nurse Practitioner | Admitting: Nurse Practitioner

## 2021-02-18 ENCOUNTER — Encounter: Payer: Self-pay | Admitting: Nurse Practitioner

## 2021-02-18 ENCOUNTER — Other Ambulatory Visit: Payer: Self-pay

## 2021-02-18 ENCOUNTER — Encounter (HOSPITAL_BASED_OUTPATIENT_CLINIC_OR_DEPARTMENT_OTHER): Payer: Self-pay | Admitting: Otolaryngology

## 2021-02-18 ENCOUNTER — Telehealth: Payer: Self-pay | Admitting: *Deleted

## 2021-02-18 ENCOUNTER — Ambulatory Visit
Admission: RE | Admit: 2021-02-18 | Discharge: 2021-02-18 | Disposition: A | Discharge status: Home / Self Care | Payer: Medicare Other | Source: Ambulatory Visit | Attending: Radiation Oncology | Admitting: Radiation Oncology

## 2021-02-18 ENCOUNTER — Inpatient Hospital Stay: Payer: Medicare Other

## 2021-02-18 ENCOUNTER — Inpatient Hospital Stay: Payer: Medicare Other | Admitting: Dietician

## 2021-02-18 VITALS — BP 160/86 | HR 63 | Temp 97.6°F | Resp 18 | Ht 66.0 in | Wt 122.4 lb

## 2021-02-18 VITALS — BP 143/67 | HR 57 | Temp 97.3°F | Resp 18 | Wt 122.0 lb

## 2021-02-18 DIAGNOSIS — K219 Gastro-esophageal reflux disease without esophagitis: Secondary | ICD-10-CM | POA: Diagnosis not present

## 2021-02-18 DIAGNOSIS — C153 Malignant neoplasm of upper third of esophagus: Secondary | ICD-10-CM | POA: Diagnosis not present

## 2021-02-18 DIAGNOSIS — I10 Essential (primary) hypertension: Secondary | ICD-10-CM | POA: Insufficient documentation

## 2021-02-18 DIAGNOSIS — M47812 Spondylosis without myelopathy or radiculopathy, cervical region: Secondary | ICD-10-CM | POA: Diagnosis not present

## 2021-02-18 DIAGNOSIS — D45 Polycythemia vera: Secondary | ICD-10-CM | POA: Diagnosis not present

## 2021-02-18 DIAGNOSIS — Z923 Personal history of irradiation: Secondary | ICD-10-CM | POA: Diagnosis not present

## 2021-02-18 DIAGNOSIS — G500 Trigeminal neuralgia: Secondary | ICD-10-CM

## 2021-02-18 DIAGNOSIS — Z806 Family history of leukemia: Secondary | ICD-10-CM | POA: Diagnosis not present

## 2021-02-18 DIAGNOSIS — Z8041 Family history of malignant neoplasm of ovary: Secondary | ICD-10-CM

## 2021-02-18 DIAGNOSIS — I6523 Occlusion and stenosis of bilateral carotid arteries: Secondary | ICD-10-CM | POA: Insufficient documentation

## 2021-02-18 DIAGNOSIS — E042 Nontoxic multinodular goiter: Secondary | ICD-10-CM | POA: Diagnosis not present

## 2021-02-18 DIAGNOSIS — E43 Unspecified severe protein-calorie malnutrition: Secondary | ICD-10-CM | POA: Insufficient documentation

## 2021-02-18 DIAGNOSIS — E785 Hyperlipidemia, unspecified: Secondary | ICD-10-CM | POA: Insufficient documentation

## 2021-02-18 DIAGNOSIS — R1310 Dysphagia, unspecified: Secondary | ICD-10-CM | POA: Diagnosis not present

## 2021-02-18 DIAGNOSIS — Z79899 Other long term (current) drug therapy: Secondary | ICD-10-CM | POA: Diagnosis not present

## 2021-02-18 DIAGNOSIS — I6782 Cerebral ischemia: Secondary | ICD-10-CM | POA: Insufficient documentation

## 2021-02-18 DIAGNOSIS — Z90710 Acquired absence of both cervix and uterus: Secondary | ICD-10-CM | POA: Diagnosis not present

## 2021-02-18 DIAGNOSIS — K760 Fatty (change of) liver, not elsewhere classified: Secondary | ICD-10-CM | POA: Insufficient documentation

## 2021-02-18 DIAGNOSIS — R634 Abnormal weight loss: Secondary | ICD-10-CM | POA: Diagnosis not present

## 2021-02-18 LAB — CBC WITH DIFFERENTIAL (CANCER CENTER ONLY)
Abs Immature Granulocytes: 0.02 10*3/uL (ref 0.00–0.07)
Basophils Absolute: 0 10*3/uL (ref 0.0–0.1)
Basophils Relative: 1 %
Eosinophils Absolute: 0.2 10*3/uL (ref 0.0–0.5)
Eosinophils Relative: 3 %
HCT: 41.2 % (ref 36.0–46.0)
Hemoglobin: 13.3 g/dL (ref 12.0–15.0)
Immature Granulocytes: 0 %
Lymphocytes Relative: 17 %
Lymphs Abs: 1.2 10*3/uL (ref 0.7–4.0)
MCH: 29.2 pg (ref 26.0–34.0)
MCHC: 32.3 g/dL (ref 30.0–36.0)
MCV: 90.4 fL (ref 80.0–100.0)
Monocytes Absolute: 0.5 10*3/uL (ref 0.1–1.0)
Monocytes Relative: 7 %
Neutro Abs: 5.1 10*3/uL (ref 1.7–7.7)
Neutrophils Relative %: 72 %
Platelet Count: 244 10*3/uL (ref 150–400)
RBC: 4.56 MIL/uL (ref 3.87–5.11)
RDW: 13.2 % (ref 11.5–15.5)
WBC Count: 7.1 10*3/uL (ref 4.0–10.5)
nRBC: 0 % (ref 0.0–0.2)

## 2021-02-18 LAB — CMP (CANCER CENTER ONLY)
ALT: 13 U/L (ref 0–44)
AST: 17 U/L (ref 15–41)
Albumin: 3.6 g/dL (ref 3.5–5.0)
Alkaline Phosphatase: 87 U/L (ref 38–126)
Anion gap: 10 (ref 5–15)
BUN: 26 mg/dL — ABNORMAL HIGH (ref 8–23)
CO2: 28 mmol/L (ref 22–32)
Calcium: 10.2 mg/dL (ref 8.9–10.3)
Chloride: 103 mmol/L (ref 98–111)
Creatinine: 0.85 mg/dL (ref 0.44–1.00)
GFR, Estimated: >60 mL/min (ref >60)
Glucose, Bld: 87 mg/dL (ref 70–99)
Potassium: 4.2 mmol/L (ref 3.5–5.1)
Sodium: 141 mmol/L (ref 135–145)
Total Bilirubin: 0.6 mg/dL (ref 0.3–1.2)
Total Protein: 6.8 g/dL (ref 6.5–8.1)

## 2021-02-18 LAB — MAGNESIUM: Magnesium: 2.2 mg/dL (ref 1.7–2.4)

## 2021-02-18 LAB — PHOSPHORUS: Phosphorus: 3.1 mg/dL (ref 2.5–4.6)

## 2021-02-18 NOTE — Telephone Encounter (Signed)
CALLED PATIENT TO INFORM OF PET SCAN FOR 02-23-21- ARRIVAL TIME - 9 AM @ [facility], PATIENT TO HAVE WATER ONLY- 6 HRS. PRIOR TO TEST, LVM FOR A RETURN CALL

## 2021-02-18 NOTE — Progress Notes (Signed)
[place]   [phone] [phone] [place] Note   Patient Care Team: [name], [name], MD as PCP - General [name], MD as PCP - Cardiology (Cardiology) Cindie Crumbly (Neurosurgery) Irine Seal, MD as Attending Physician (Urology) Erline Levine, MD as Consulting Physician (Neurosurgery) [name], MD as Consulting Physician (Neurology) Viona Gilmore, [facility] as Pharmacist (Pharmacist) [name], MD as Consulting Physician (Gastroenterology) Carol Ada, MD as Consulting Physician (Gastroenterology) [name], MD as Consulting Physician (Otolaryngology) [name], MD as Consulting Physician (Hematology) Kyung Rudd, MD as Consulting Physician (Radiation Oncology) 02/18/2021  CHIEF COMPLAINTS/PURPOSE OF CONSULTATION:  Esophagus cancer, referred by GI Dr. Silvano Rusk  Oncology History  Malignant neoplasm of upper third esophagus ([place])  12/21/2020 Imaging   Laryngoscopy Comments:    On fiberoptic laryngoscopy through the right nostril the nasopharynx was clear.  The base of tongue vallecula and epiglottis were normal.    Piriform sinuses were clear bilaterally and vocal cords were clear with  normal vocal mobility.  No structural abnormalities noted.   01/22/2021 Imaging   DG esophagus IMPRESSION: 1. Luminal narrowing in the high cervical esophagus over a 3 cm segment is highly concerning for esophageal neoplasm. Inflammatory process would be a secondary consideration. Luminal narrowing occurs approximately at the C5-C7 vertebral body level just below the glottis. Patient experienced several episodes of choking related to this luminal narrowing. Recommend expedient upper GI endoscopy for evaluation. 2. Distal thoracic esophagus and GE junction appear normal.   02/04/2021 Procedure   EGD by Dr. Carlean Purl impression- One mass-like severe stenosis was found in the upper third of the esophagus. The stenosis  was not traversed.   02/05/2021 Imaging   CT soft tissue neck w contrast IMPRESSION: Mass-like soft tissue thickening and mucosal hyperenhancement of the cervical esophagus and hypopharynx, spanning the C4-T1 levels, likely reflecting an esophageal malignancy. This measures up to 2.4 x 3.4 cm in transaxial dimensions, and 6.3 cm in craniocaudal dimension. Associated severe effacement of the esophageal lumen.   Centrally necrotic right paratracheal lymph node beneath the level of the mass, measuring 1.3 x 0.9 cm, and likely reflecting a site of nodal metastatic disease.   Additional lymph nodes along the posterior and inferior aspect of the right thyroid lobe (along the right aspect of the esophageal mass), which measure subcentimeter but are asymmetrically prominent and highly suspicious for additional sites of nodal metastatic disease.   02/12/2021 Pathology Results   FINAL MICROSCOPIC DIAGNOSIS:  A. ESOPHAGUS, UPPER CERVICAL #1, BIOPSY:  - Squamous cell carcinoma.  B. ESOPHAGUS, UPPER CERVICAL #2, BIOPSY:  - Squamous cell carcinoma.    02/18/2021 Initial Diagnosis   Malignant neoplasm of upper third esophagus (HCC)      HISTORY OF PRESENTING ILLNESS:  [name] 80 y.o. female with past medical history including HTN, HL, OA, trigeminal neuralgia, and chronic headaches, is here because of newly diagnosed esophagus cancer. She developed dysphagia and began work up by PCP. She underwent laryngoscopy 12/21/20 by Dr. Lucia Gaskins which showed normal nasopharynx, base of tongue, epiglottis, piriform sinuses and vocal cords. There were no structural abnormalities. Esophagus Xray 01/22/21 showed luminal narrowing in the high cervical esophagus C5-C7  just below glottis over a 3 cm segment highly suspicious for neoplasm. The distal esophagus and GE junction appeared normal. She underwent EGD by Dr. Carlean Purl 02/04/21 which showed mass-like stenosis in the upper third of esophagus which was unable to be  traversed. Further work  up CT soft tissue neck 02/05/21 showed mass like soft tissue thickening and mucousal hyperenhancement of the cervical esophagus and hypopharynx spanning C4 - T1 measuring up to 2.4 x 3.4 cm and 6.3 craniocaudal, associated with severe effacement of the esophageal lumen. There was also a centrally necrotic 1.3 x 0.9 cm right paratracheal node and small subcentimeter LNs along the posterior and inferior right thyroid lobe, concerning for nodal metastasis. She underwent repeat laryngoscopy by Dr. Lucia Gaskins 02/12/21, two biopsies of the upper cervical esophagus confirmed squamous cell carcinoma. She met with Dr. Lisbeth Renshaw today, PET scan is pending.   Socially, she is married and lives with her spouse at [facility] in independent living, they are highly functional. Ambulates with walker. 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 She drank alcohol socially, not recently, but never smoked. She has 2 healthy children, 1 of whom is a retired ED physician in [place]. Family history is significant for mother who had uterine cancer.   Today, she presents with her husband. While recovering from 2 operations on her back for sciatica, she developed acute onset of dysphagia in July/august. She has not eaten solids recently, mostly liquid or pureed diet. She drinks 3-4 Boosts per day, takes her over an hour to get it down. If she eats too fast she will choke or regurgitate. She has lost 10 lbs. She has a cough from secretions. Denies recent n/v, constipation, diarrhea, abdominal or other pain, fever, chills, chest pain, or dyspnea.    MEDICAL HISTORY:  Past Medical History:  Diagnosis Date   Abdominal pain 05/29/2013   s/p rx of cephalo resistant e coli   but last rx NG  now residular ?  bladder sx repeat cx sx rx to ty and uro consult    ADJ DISORDER WITH MIXED ANXIETY & DEPRESSED MOOD 03/03/2010   Qualifier: Diagnosis of  By: [name], [name]    Agent  resistant to multiple antibiotics 05/29/2013   e coli   bu NG on fu.     Anxiety    ARF (acute renal failure) ([place]) 03/12/2015   Closed head injury 02/01/2011   from syncope and had scalp laceration  neg ct .     Closed head injury 5-6 yrs ago   Colitis 02/77/4128   Complication of anesthesia    migraine several hours after general anesthesia   Depression    Fatty liver    Gall stones 2016   see ct scan neg HIDA    GERD (gastroesophageal reflux disease)    Hearing aid worn    HOH (hard of hearing)    both ears   Hyperlipidemia    Hypertension    echo nl lv function  mild dilitation 2009   Kidney infection    few yrs ago in hospital   Medication side effect 09/02/2010   Poss muscle se of 10 crestor    Migraine    hypnic HA eval by Dr. 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    Pyelonephritis 03/12/2015   Sensation of pain in anesthetized distribution of trigeminal nerve    Syncope 02/01/2011   In shower on vacation  sustained head laceration  8 sutures Had ed visit neg head ct labs and x ray    Trigeminal neuralgia pain     SURGICAL HISTORY: Past Surgical History:  Procedure Laterality Date   ABDOMINAL HYSTERECTOMY  2002  tubal   BACK SURGERY     2 times, for sciatic nerve pain   CARDIAC CATHETERIZATION  2000   chest pains neg   CHOLECYSTECTOMY N/A 02/21/2017   Procedure: LAPAROSCOPIC CHOLECYSTECTOMY WITH INTRAOPERATIVE CHOLANGIOGRAM;  Surgeon: Armandina Gemma, MD;  Location: [facility];  Service: General;  Laterality: N/A;   COLONOSCOPY     multiple   CRANIOTOMY  12/09/2011   nerve decompression right trigeminal    DIRECT LARYNGOSCOPY N/A 02/12/2021   Procedure: DIRECT LARYNGOSCOPY AND BIOPSY POSSIBLE FROZEN;  Surgeon: [name], MD;  Location: [facility];  Service: ENT;  Laterality: N/A;   DOPPLER ECHOCARDIOGRAPHY  2009   nl lv function mild lv dilitation   EYE SURGERY Bilateral    ioc for catatracts    laparoscopic gallbladder surgery  02/16/2017   Fax from [facility] Bilateral 2002   rt shoulder surgery      SOCIAL HISTORY: Social History   Socioeconomic History   Marital status: Married    Spouse name: Not on file   Number of children: 2   Years of education: Not on file   Highest education level: Not on file  Occupational History    Comment: retired Forensic psychologist  Tobacco Use   Smoking status: Never   Smokeless tobacco: Never  Vaping Use   Vaping Use: Never used  Substance and Sexual Activity   Alcohol use: Not Currently    Alcohol/week: 2.0 standard drinks    Types: 2 Glasses of wine per week    Comment: occ wine   Drug use: No   Sexual activity: Not on file  Other Topics Concern   Not on file  Social History Narrative   Married   HH of 2-3 (god daughter)   Pets 2 dogs   Non smoker    Child is a Engineer, drilling   G2P2      Caffeine: 2 cups/day   Social Determinants of Radio broadcast assistant Strain: Low Risk    Difficulty of Paying Living Expenses: Not hard at all  Food Insecurity: No Food Insecurity   Worried About Charity fundraiser in the Last Year: Never true   Arboriculturist in the Last Year: Never true  Transportation Needs: No Transportation Needs   Lack of Transportation (Medical): No   Lack of Transportation (Non-Medical): No  Physical Activity: Inactive   Days of Exercise per Week: 0 days   Minutes of Exercise per Session: 0 min  Stress: No Stress Concern Present   Feeling of Stress : Not at all  Social Connections: Moderately Integrated   Frequency of Communication with Friends and Family: More than three times a week   Frequency of Social Gatherings with Friends and Family: Once a week   Attends Religious Services: 1 to 4 times per year   Active Member of [employer] or Organizations: No   Attends Music therapist: Never   Marital Status: Married  Human resources officer Violence: Not At Risk   Fear of  Current or Ex-Partner: No   Emotionally Abused: No   Physically Abused: No   Sexually Abused: No    FAMILY HISTORY: Family History  Problem Relation Age of Onset   Ovarian cancer Mother    Stroke Mother    Alcohol abuse Father    Stroke Father    Diabetes Brother    Cancer Paternal Aunt        leukemia, unknown type  Seizures Daughter    Hypertension Other    Colon cancer Neg Hx    Dementia Neg Hx    Alzheimer's disease Neg Hx     ALLERGIES:  is allergic to elemental sulfur, hydrocodone, and sulfamethoxazole-trimethoprim.  MEDICATIONS:  Current Outpatient Medications  Medication Sig Dispense Refill   Acetaminophen (TYLENOL PO) Take 2 tablets by mouth as needed.     benazepril (LOTENSIN) 20 MG tablet Take 1 tablet (20 mg total) by mouth 2 (two) times daily. 180 tablet 1   Calcium Carb-Cholecalciferol (CALCIUM 600+D) 600-800 MG-UNIT TABS Take 1 tablet by mouth 2 (two) times daily.     carvedilol (COREG) 25 MG tablet TAKE 1 TABLET(25 MG) BY MOUTH TWICE DAILY WITH A MEAL 60 tablet 3   conjugated estrogens (PREMARIN) vaginal cream 2 (two) times a week.     fluticasone (FLONASE) 50 MCG/ACT nasal spray Place 2 sprays into both nostrils as needed for allergies or rhinitis. 16 g 5   furosemide (LASIX) 40 MG tablet Take 1 tablet (40 mg total) by mouth daily. 30 tablet 3   lamoTRIgine (LAMICTAL) 100 MG tablet Take 2 pills in the morning(254m) and evening. May take an additional 1/2 pill(541m if needed with the morning and evening doses (Patient taking differently: Takes 100 mg in the morning and 150 mg in the evening.) 150 tablet 11   Multiple Vitamin (MULTIVITAMIN ADULT PO) Take 1 tablet by mouth.     omeprazole (PRILOSEC) 20 MG capsule Take 1 capsule (20 mg total) by mouth daily. 30 capsule 2   ondansetron (ZOFRAN-ODT) 4 MG disintegrating tablet Take 1-2 tablets (4-8 mg total) by mouth every 8 (eight) hours as needed for nausea. 60 tablet 3   OVER THE COUNTER MEDICATION OTC eye drops      Oxcarbazepine (TRILEPTAL) 300 MG tablet Take 0.5 tablets (150 mg total) by mouth 2 (two) times daily. 30 tablet 5   alendronate (FOSAMAX) 70 MG tablet TAKE 1 TABLET BY MOUTH 1 TIME A WEEK WITH FULL GLASS OF WATER AND ON AN EMPTY STOMACH AS DIRECTED (Patient not taking: No sig reported) 4 tablet 2   No current facility-administered medications for this visit.    REVIEW OF SYSTEMS:   Constitutional: Denies fevers, chills or abnormal night sweats (+) weight loss  Eyes: Denies blurriness of vision, double vision or watery eyes Ears, nose, mouth, throat, and face: Denies mucositis or sore throat (+) trigeminal neuralgia  Respiratory: Denies dyspnea or wheezes (+) cough  Cardiovascular: Denies palpitation, chest discomfort or lower extremity swelling Gastrointestinal:  Denies nausea, heartburn or change in bowel habits (+) dysphagia  Skin: Denies abnormal skin rashes Lymphatics: Denies new lymphadenopathy or easy bruising Neurological:Denies numbness, tingling or new weaknesses (+) sciatica  Behavioral/Psych: Mood is stable, no new changes  All other systems were reviewed with the patient and are negative.  PHYSICAL EXAMINATION: ECOG PERFORMANCE STATUS: 1 - Symptomatic but completely ambulatory  Vitals:   02/18/21 1151  BP: (!) 160/86  Pulse: 63  Resp: 18  Temp: 97.6 F (36.4 C)  SpO2: 97%   Filed Weights   02/18/21 1151  Weight: 122 lb 6.4 oz (55.5 kg)    GENERAL:alert, no distress and comfortable SKIN: no rash  EYES: sclera clear LUNGS: clear with normal breathing effort HEART: regular rate & rhythm, no lower extremity edema PSYCH: alert & oriented x 3 with fluent speech NEURO: no focal motor deficits Exam performed sitting in chair   LABORATORY DATA:  I have reviewed the data as  listed CBC Latest Ref Rng & Units 02/18/2021 02/04/2021 11/26/2020  WBC 4.0 - 10.5 K/uL 7.1 5.3 6.8  Hemoglobin 12.0 - 15.0 g/dL 13.3 13.3 14.1  Hematocrit 36.0 - 46.0 % 41.2 40.4 44.3   Platelets 150 - 400 K/uL 244 205.0 222   CMP Latest Ref Rng & Units 02/18/2021 02/04/2021 11/26/2020  Glucose 70 - 99 mg/dL 87 84 100(H)  BUN 8 - 23 mg/dL 26(H) 15 20  Creatinine 0.44 - 1.00 mg/dL 0.85 0.92 1.11(H)  Sodium 135 - 145 mmol/L 141 141 142  Potassium 3.5 - 5.1 mmol/L 4.2 3.5 4.5  Chloride 98 - 111 mmol/L 103 100 97  CO2 22 - 32 mmol/L 28 36(H) 30(H)  Calcium 8.9 - 10.3 mg/dL 10.2 10.4 10.5(H)  Total Protein 6.5 - 8.1 g/dL 6.8 6.6 6.6  Total Bilirubin 0.3 - 1.2 mg/dL 0.6 0.7 0.6  Alkaline Phos 38 - 126 U/L 87 82 81  AST 15 - 41 U/L 17 14 15   ALT 0 - 44 U/L 13 13 15      RADIOGRAPHIC STUDIES: I have personally reviewed the radiological images as listed and agreed with the findings in the report. CT SOFT TISSUE NECK W CONTRAST  Result Date: 02/05/2021 CLINICAL DATA:  Provided history: Esophageal mass. Esophageal mass; proximal esophageal mass. EXAM: CT NECK WITH CONTRAST TECHNIQUE: Multidetector CT imaging of the neck was performed using the standard protocol following the bolus administration of intravenous contrast. CONTRAST:  58m OMNIPAQUE IOHEXOL 300 MG/ML  SOLN COMPARISON:  Esophagram 01/22/2021. FINDINGS: Pharynx and larynx: Small nonspecific foci of calcification within the left perimandibular soft tissues (for instance as seen on series 3, image 52). Streak and beam hardening artifact arising from dental restoration partially obscures the oral cavity and oropharynx. No appreciable swelling or discrete mass within the oral cavity, nasopharynx, or oropharynx. Please see hypopharyngeal findings below. Salivary glands: No inflammation, mass, or stone. Thyroid: Multiple small bilateral thyroid nodules measuring up to 11 mm, not meeting consensus criteria for ultrasound follow-up based on size. Lymph nodes: Centrally necrotic right paratracheal lymph node beneath the level of the mass, measuring 1.3 x 0.9 cm and likely reflecting a site of nodal metastatic disease (series 3, image 87).  There are additional lymph nodes along the posterior and inferior aspect of the right thyroid lobe, and along the right aspect of the esophageal mass, which measure subcentimeter but are asymmetrically prominent and highly suspicious for additional sites of nodal metastatic disease (for instance as seen on series 3, image 80). Vascular: The major vascular structures of the neck are patent. Atherosclerotic plaque within the visualized aortic arch and bilateral carotid arteries. Limited intracranial: Prior right retrosigmoid craniotomy. No evidence of acute intracranial mallet within the field of view. Visualized orbits: No mass or acute finding. Mastoids and visualized paranasal sinuses: Minimal fluid within the right ethmoid air cells. Trace fluid within the right mastoid air cells. Skeleton: Cervical spondylosis. No acute bony abnormality or aggressive osseous lesion. Redemonstrated right retrosigmoid cranioplasty. Upper chest: No consolidation within the imaged lung apices. Other: Masslike soft tissue thickening and mucosal hyperenhancement of the cervical esophagus and hypopharynx, spanning the C4-T1 levels, likely reflecting an esophageal malignancy. This measures up to 2.4 x 3.4 cm in transaxial dimensions, and 6.3 cm in craniocaudal dimension (for instance as seen on series 3, image 71) (series 6, image 41). Associated severe effacement of the esophageal lumen. IMPRESSION: Mass-like soft tissue thickening and mucosal hyperenhancement of the cervical esophagus and hypopharynx, spanning the C4-T1 levels, likely  reflecting an esophageal malignancy. This measures up to 2.4 x 3.4 cm in transaxial dimensions, and 6.3 cm in craniocaudal dimension. Associated severe effacement of the esophageal lumen. Centrally necrotic right paratracheal lymph node beneath the level of the mass, measuring 1.3 x 0.9 cm, and likely reflecting a site of nodal metastatic disease. Additional lymph nodes along the posterior and inferior  aspect of the right thyroid lobe (along the right aspect of the esophageal mass), which measure subcentimeter but are asymmetrically prominent and highly suspicious for additional sites of nodal metastatic disease. Consider a PET-CT for further staging. Electronically Signed   By: Kellie Simmering D.O.   On: 02/05/2021 14:32   DG ESOPHAGUS W DOUBLE CM (HD)  Addendum Date: 01/22/2021   ADDENDUM REPORT: 01/22/2021 12:59 ADDENDUM: Findings and recommendations conveyed to PA, Desmond Dike 01/22/2021 at12:58. Electronically Signed   By: Suzy Bouchard M.D.   On: 01/22/2021 12:59   Result Date: 01/22/2021 CLINICAL DATA:  Difficulty swallowing foods. Patient only able to tolerate baby food currently. Patient has limited mobility. EXAM: ESOPHAGUS/BARIUM SWALLOW/TABLET STUDY TECHNIQUE: Initial scout AP supine abdominal image obtained to insure adequate colon cleansing. Barium was introduced into the colon in a retrograde fashion and refluxed from the rectum to the cecum. Spot images of the colon followed by overhead radiographs were obtained. FLUOROSCOPY TIME:  Fluoroscopy Time:  1 minute 42 seconds Radiation Exposure Index (if provided by the fluoroscopic device): 1.7 mGy Number of Acquired Spot Images: 6 COMPARISON:  None. FINDINGS: Rapid swallowing evaluation of the high cervical esophagus demonstrates irregular narrowing of the lumen over a 3 cm segment at the C5-C7 vertebral body levels. There is also prevertebral soft tissue swelling through this region. The evaluation the high cervical esophagus was initiated after patient had several choking episodes with normal esophagram inferiorly. The thoracic esophagus and distal esophagus are normal. No obstruction, mass or mucosal irregularity. IMPRESSION: 1. Luminal narrowing in the high cervical esophagus over a 3 cm segment is highly concerning for esophageal neoplasm. Inflammatory process would be a secondary consideration. Luminal narrowing occurs approximately at the  C5-C7 vertebral body level just below the glottis. Patient experienced several episodes of choking related to this luminal narrowing. Recommend expedient upper GI endoscopy for evaluation. 2. Distal thoracic esophagus and GE junction appear normal. Electronically Signed: By: Suzy Bouchard M.D. On: 01/22/2021 12:20    ASSESSMENT & PLAN: 80 yo female with  1.Squamous cell carcinoma of cervical esophagus  -We reviewed her extensive work up and diagnosis with the patient and family in detail which showed 3x6 cm mass in the cervical/upper esophagus with possible local adenopathy. She is quite symptomatic with dysphagia and weight loss -she is scheduled for PET scan 10/4 to complete staging. -If she has no distant metastasis, we discussed with standard treatment is concurrent chemoradiation followed by surgery. -Due to the location of her tumor, this surgery will be challenging. We plan to discuss her case in thoracic tumor board with Dr. Kipp Brood. We also discussed in [facility] - Eat, some patients may achieve complete response with chemoRT alone and may not require surgery. -if PET is negative for distant metastasis, Dr. [name] recommends concurrent chemoradiation with carboplatin and taxol. Due to her age she is not an ideal candidate for FOLFOX or cisplatin.  -Chemotherapy consent - taxol and carboplatin: Side effects including but not limited to fatigue, nausea, vomiting, diarrhea, hair thinning/loss, neuropathy, fluid retention, renal and kidney dysfunction, neutropenic fever, need for blood transfusion, bleeding, were discussed with patient in great detail.  She agrees to proceed. Goal is curative or palliative, depending on PET scan and surgical eligibility -If PET is positive for metastatic disease, we discussed doing palliative radiation first, then chemo and/or immunotherapy. We will request PD-L1 testing on her biopsy to assess the benefit of immunotherapy. -Patient and her husband shared that they did not  want [place] to call their daughter who is ED physician at this time, they will share information with their family -will do lab today, and chemo class next week. Plan to hold on [facility] for now -f/up 10/10 or first day of chemoRT.    2.Dysphagia, weight loss  -Secondary to #1 -She has not tolerated solids lately, only pureed or liquid diet with difficulty. Can change meds to liquid as applicable  -She currently drinks Boost throughout the day. She is a candidate for feeding tube -Her case was reviewed by Dr. Pascal Lux and Dr. Serafina Royals, anatomy is amenable to G tube, appreciate IR being able to place quickly 10/3 -She met with dietician today for education  -Continue omeprazole and ondansetron PRN -CBC, CMP, mag, Phos are normal today, will monitor with tube feeding due to risk for refeeding syndrome  3.Trigeminal neuralgia, sciatica, headaches -On lamotrigene and oxcarbazepine   4.HTN -continue benazepril, furosemide  -will monitor BP closely on treatment. She will likely need to hold lasix  5.Goals of Care -most recent code status is DNR from hospitalization 05/16/20, we did not discuss this at today's consult   PLAN: -Work up and diagnosis reviewed -Lab and dietician consult today -IR G tube placement 10/3 -PET scan 10/4 -Chemo class next week  -Discuss case in thoracic tumor board  -Follow up 10/10 with first chemoRT  Orders Placed This Encounter  Procedures   IR Gastrostomy Tube    Standing Status:   Future    Standing Expiration Date:   02/18/2022    Order Specific Question:   Reason for exam:    Answer:   esophagus cancer, severe dysphagia, weight loss    Order Specific Question:   Preferred Imaging Location?    Answer:   [facility]   CBC with Differential (Cancer Center Only)    Standing Status:   Standing    Number of Occurrences:   50    Standing Expiration Date:   02/18/2022   CMP ([place] only)    Standing Status:   Standing    Number of Occurrences:   50     Standing Expiration Date:   02/18/2022   Magnesium    Standing Status:   Standing    Number of Occurrences:   1    Standing Expiration Date:   02/18/2022   Phosphorus    Standing Status:   Standing    Number of Occurrences:   1    Standing Expiration Date:   02/18/2022    All questions were answered. The patient knows to call the clinic with any problems, questions or concerns.    [name], NP 02/18/2021    Addendum  I have seen the patient, examined her. I agree with the assessment and and plan and have edited the notes.   80 yo female with PMH of hypertension, OA, trigeminal neuralgia, presented with rapid onset dysphagia and weight loss.  She is on liquid and soft diet now.  I reviewed endoscopy findings and the biopsy results with patient and her husband in detail.  She is scheduled for PET scan for staging next week.  If no distant metastasis  on the scan, I recommend concurrent chemoradiation with weekly Carboplatin and Taxol.  I do not think she is a candidate concurrent chemoradiation with FOLFOX or cisplatin and 5-fu.  Surgical resection will be difficult due to the location of the esophageal cancer, I have reviewed with thoracic surgeon Dr. Kipp Brood.  If she tolerates chemo and radiation well, I would consider consolidation chemo for 3 months and or immunotherapy for 1-2 years. Due to her significant dysphagia and weight loss, we recommend PEG feeding tube placement as soon as possible.  I have personally dimensional radiologist Dr. Pascal Lux and Dr. Serafina Royals, and get her scheduled for next Monday 10/3.  I made urgent dietitian referral, she will be seen this afternoon, to prepare her starting tube feeds. I will request PD-L1 on her biopsy sample, which will help differentiate her response to immunotherapy.  All questions were answered.  [name] MD 02/18/2021

## 2021-02-18 NOTE — Progress Notes (Signed)
I met with Mrs and Mr Heffernan after her consultation with Lacie Burton, NP and Dr Feng.  I reviewed my role as a nurse navigator and provided my contact information.  I reviewed PEG tube site care and briefly reviewed used.  I told them the dietician would review use of PEG tube again.  I told them that IR would place the tube. I briefly explained she would be getting chemo once per week during her radiation treatments. 

## 2021-02-18 NOTE — Progress Notes (Signed)
Nutrition Assessment  ASSESSMENT: 80 year old female with newly diagnosed esophageal cancer.  Plan for concurrent chemoradiation therapy. Patient is pending PEG placement on 10/13.Patient is followed by Dr. [name] and Dr. Lisbeth Renshaw  Past medical history includes HTN, chronic cholecystitis, dysphagia, trigeminal neuralgia, osteoarthritis, HLD, hearing aid worn, polycythemia, postmenopausal HRT, GERD, anxiety, depression  Met with patient and husband in clinic. It has been a long day and ready to get home to their dogs. Patient reports trying to drink 5 Boost High Protein (250 kcal, 20 g protein), but this takes her all day. Patient is tolerating only liquids, small sips at a time. Her husband makes her fruit smoothies mixed with fruit and ice cream. Patient reports usual weight and feeling her best around 132 lbs. Patient reports intentional weight loss from 173 lb after they stopped eating wheat. Patient is s/p gamma knife for trigeminal neuralgia in August, she is having intermittent facial pain this afternoon. She denies nausea, vomiting, diarrhea, constipation.   Nutrition Focused Physical Exam: Deferred   Medications: Prilosec, coreg, lasix, zofran   Labs: 9/15 labs reviewed   Anthropometrics: Weight 122 lb today increased from 119 lb 0.8 oz on 9/23 and 119 lb 9.6 oz on 9/14  Height: 5'6" Weight: 55.3 kg  UBW: 132 lb (per pt) BMI: 19.69   Estimated Energy Needs  Kcals: 6520-7619 Protein: 72-88 Fluid: 1.7 L   NUTRITION DIAGNOSIS: Inadequate oral intake related to newly diagnosed esophageal cancer pending concurrent chemoradiation therapy as evidenced by dysphagia, liquid diet meeting <75% of minimum estimated needs per dietary recall, and 7.6% (10 lb) decrease from usual body weight   INTERVENTION:  Educated on importance of adequate intake of calorie and protein energy to maintain strength, energy, nutrition Educated on nutrition profiles of supplements, suggested switching to  higher calorie supplement, provided sample of Ensure Complete for patient to try Provided tube feeding education and PEG care, teach back method used Husband successfully demonstrated water flush and understanding administering feeding Complimentary case of Osmolite 1.5 and PEG kit provided to patient today Once tube is placed, recommend -5 cartons Osmolite 1.5 split over 4 feedings/day. Flush tube with 60 ml water before and after each feeding. Drink by mouth or give via tube additional 2 c fluids/day. This will provide 1775 kcal, 74.5 grams protein, and 1865 ml total water. Meets 100% of needs.  Contact information provided  MONITORING, EVALUATION, GOAL: Patient will tolerate adequate calories and protein to prevent weight loss   Next Visit: To be scheduled weekly with treatment

## 2021-02-18 NOTE — Progress Notes (Signed)
Radiation Oncology         [phone]) [phone] ________________________________  Name: [name]        MRN: [id number]  Date of Service: 02/18/2021 DOB: [birth year]  OT:RRNHAF, [name], MD  [name], MD     REFERRING PHYSICIAN: [name], MD   DIAGNOSIS: The encounter diagnosis was Malignant neoplasm of upper third esophagus ([place]).   HISTORY OF PRESENT ILLNESS: [name] is a 80 y.o. female seen at the request of Dr. Carlean Purl for newly diagnosed esophageal cancer.  The patient had presented with neck pain and was being worked up by neurosurgery, she had an MRI of the cervical spine on 07/08/2020 she had multilevel degenerative changes of the cervical spine and foraminal narrowing at the left aspect of C5-C6.  An MRI due to the symptoms of ataxia as well the same day showed no evidence of infarction hemorrhage or mass.  She had had previous postoperative changes following a trigeminal nerve decompression and stable mild chronic microvascular ischemic changes.  Recent fall in August 2022 and trip to [place] [facility] ED included a CT of the head showed a contusion and hematoma of the right scalp without fracture of the spine or intracranial pathology.  A CT of the cervical spine showed no acute findings either.  She presented with dysphagia and was seen by Dr. Lucia Gaskins for evaluation.  She had been seen by Dr. Lavell Anchors as well as Dr. Regis Bill for treatment of her trigeminal neuralgia and it was felt that perhaps medication for this caused a sore throat and difficulty swallowing.  She describes the swallowing high up in the throat and laryngoscopy into the nasopharynx was clear the base of the vallecula and epiglottis were normal and piriform sinuses were clear the vocal cords were clear no structural abnormalities were otherwise noted.  A modified barium swallow was recommended which was performed on 01/22/2021 and showed luminal narrowing of the high cervical esophagus over a 3 cm  segment at the level of C5-C7 vertebral body just below the glottis.  She experienced several episodes of choking related to the narrowing.  CT of the neck with contrast was performed on 02/05/2021 showing a 2.4 x 3.4 mass like thickening and mucosal hyperenhancement craniocaudally this was noted to be 6.3 cm in greatest dimension and a centrally necrotic right paratracheal lymph node below the level of the mass measuring 1.3 cm was seen.  She did attempt upper endoscopy as well with Dr. Carlean Purl on 02/04/2021 but he could not traverse the stenosis.  She went back for evaluation with Dr. 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  Given these findings she is seen to discuss treatment recommendations of her cancer.  She is  to meet with Cira Rue, NP and Dr. [name] this morning.   PREVIOUS RADIATION THERAPY:   01/05/2021: The right trigeminal nerve was treated to 85 Gy to the 100% isodose line using a total of 1 collimators with the 4 mm collimator. By Dr. Vallarie Mare at [facility]:  Past Medical History:  Diagnosis Date   Abdominal pain 05/29/2013   s/p rx of cephalo resistant e coli   but last rx NG  now residular ?  bladder sx repeat cx sx rx to ty and uro consult    ADJ DISORDER WITH MIXED ANXIETY & DEPRESSED MOOD 03/03/2010  Qualifier: Diagnosis of  By: [name], [name]    Agent resistant to multiple antibiotics 05/29/2013   e coli   bu NG on fu.     Anxiety    ARF (acute renal failure) ([place]) 03/12/2015   Closed head injury 02/01/2011   from syncope and had scalp laceration  neg ct .     Closed head injury 5-6 yrs ago   Colitis 99/83/3825   Complication of anesthesia    migraine several hours after general anesthesia   Depression    Fatty liver    Gall stones 2016   see ct scan neg HIDA    GERD (gastroesophageal reflux disease)     Hearing aid worn    HOH (hard of hearing)    both ears   Hyperlipidemia    Hypertension    echo nl lv function  mild dilitation 2009   Kidney infection    few yrs ago in hospital   Medication side effect 09/02/2010   Poss muscle se of 10 crestor    Migraine    hypnic HA eval by Dr. 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    Pyelonephritis 03/12/2015   Sensation of pain in anesthetized distribution of trigeminal nerve    Syncope 02/01/2011   In shower on vacation  sustained head laceration  8 sutures Had ed visit neg head ct labs and x ray    Trigeminal neuralgia pain        PAST SURGICAL HISTORY: Past Surgical History:  Procedure Laterality Date   ABDOMINAL HYSTERECTOMY  2002   tubal   BACK SURGERY     2 times, for sciatic nerve pain   CARDIAC CATHETERIZATION  2000   chest pains neg   CHOLECYSTECTOMY N/A 02/21/2017   Procedure: LAPAROSCOPIC CHOLECYSTECTOMY WITH INTRAOPERATIVE CHOLANGIOGRAM;  Surgeon: Armandina Gemma, MD;  Location: [facility];  Service: General;  Laterality: N/A;   COLONOSCOPY     multiple   CRANIOTOMY  12/09/2011   nerve decompression right trigeminal    DIRECT LARYNGOSCOPY N/A 02/12/2021   Procedure: DIRECT LARYNGOSCOPY AND BIOPSY POSSIBLE FROZEN;  Surgeon: [name], MD;  Location: [facility];  Service: ENT;  Laterality: N/A;   DOPPLER ECHOCARDIOGRAPHY  2009   nl lv function mild lv dilitation   EYE SURGERY Bilateral    ioc for catatracts   laparoscopic gallbladder surgery  02/16/2017   Fax from [facility] Bilateral 2002   rt shoulder surgery       FAMILY HISTORY:  Family History  Problem Relation Age of Onset   Cancer Mother        uterine   Ovarian cancer Mother    Stroke Mother    Alcohol abuse Father    Stroke Father    Diabetes Brother    Cancer Paternal Aunt        leukemia, unknown type   Seizures Daughter    Hypertension Other    Colon cancer Neg Hx     Dementia Neg Hx    Alzheimer's disease Neg Hx      SOCIAL HISTORY:  reports that she has never smoked. She has never used smokeless tobacco. She reports that she does not currently use alcohol after a past usage of about 2.0 standard drinks per week. She reports that she does not use drugs. The patient is married and lives in [place] at [facility]. Their  daughter is an ER physician. They are close friends with Dr. Harlow Asa and wish for him to receive her notes.    ALLERGIES: Elemental sulfur, Hydrocodone, and Sulfamethoxazole-trimethoprim   MEDICATIONS:  Current Outpatient Medications  Medication Sig Dispense Refill   Acetaminophen (TYLENOL PO) Take 2 tablets by mouth as needed.     benazepril (LOTENSIN) 20 MG tablet Take 1 tablet (20 mg total) by mouth 2 (two) times daily. 180 tablet 1   Calcium Carb-Cholecalciferol (CALCIUM 600+D) 600-800 MG-UNIT TABS Take 1 tablet by mouth 2 (two) times daily.     carvedilol (COREG) 25 MG tablet TAKE 1 TABLET(25 MG) BY MOUTH TWICE DAILY WITH A MEAL 60 tablet 3   conjugated estrogens (PREMARIN) vaginal cream 2 (two) times a week.     fluticasone (FLONASE) 50 MCG/ACT nasal spray Place 2 sprays into both nostrils as needed for allergies or rhinitis. 16 g 5   furosemide (LASIX) 40 MG tablet Take 1 tablet (40 mg total) by mouth daily. 30 tablet 3   lamoTRIgine (LAMICTAL) 100 MG tablet Take 2 pills in the morning(217m) and evening. May take an additional 1/2 pill(568m if needed with the morning and evening doses (Patient taking differently: Takes 100 mg in the morning and 150 mg in the evening.) 150 tablet 11   Multiple Vitamin (MULTIVITAMIN ADULT PO) Take 1 tablet by mouth.     omeprazole (PRILOSEC) 20 MG capsule Take 1 capsule (20 mg total) by mouth daily. 30 capsule 2   ondansetron (ZOFRAN-ODT) 4 MG disintegrating tablet Take 1-2 tablets (4-8 mg total) by mouth every 8 (eight) hours as needed for nausea. 60 tablet 3   OVER THE COUNTER  MEDICATION OTC eye drops     Oxcarbazepine (TRILEPTAL) 300 MG tablet Take 0.5 tablets (150 mg total) by mouth 2 (two) times daily. 30 tablet 5   alendronate (FOSAMAX) 70 MG tablet TAKE 1 TABLET BY MOUTH 1 TIME A WEEK WITH FULL GLASS OF WATER AND ON AN EMPTY STOMACH AS DIRECTED (Patient not taking: No sig reported) 4 tablet 2   No current facility-administered medications for this encounter.     REVIEW OF SYSTEMS: On review of systems, the patient reports that she is currently able to eat very soft foods but that it takes quite a long time to take in the volume of drinks such as ensure or premier protein. She reports she has lost about 10 pounds in the last month. She occasionally notes regurgitation when eating or drinking, and that she has to break apart most of her medications. She is having improvement in her facial pain [facility] some tingling at times in the side of her tongue as well as some mild hoarseness in her voice. She has pain when swallowing that lateralizes to the left size as well. With these dietary changes, she has noticed constipation. No other complaints are verbalized.      PHYSICAL EXAM:  Wt Readings from Last 3 Encounters:  02/18/21 122 lb 6.4 oz (55.5 kg)  02/18/21 122 lb (55.3 kg)  02/12/21 119 lb 0.8 oz (54 kg)   Temp Readings from Last 3 Encounters:  02/18/21 97.6 F (36.4 C) (Oral)  02/18/21 (!) 97.3 F (36.3 C)  02/12/21 (!) 97.5 F (36.4 C)   BP Readings from Last 3 Encounters:  02/18/21 (!) 160/86  02/18/21 (!) 143/67  02/12/21 (!) 155/90   Pulse Readings from Last 3 Encounters:  02/18/21 63  02/18/21 (!) 57  02/12/21 86   Pain Assessment Pain Score: 4  Pain Loc: Throat (Back of throat)/10  In general this is a thin, elderly appearing caucasian female in no acute distress. She's alert and oriented x4 and appropriate throughout the examination. Cardiopulmonary assessment is negative for  acute distress and she exhibits normal effort.     ECOG = 1  0 - Asymptomatic (Fully active, able to carry on all predisease activities without restriction)  1 - Symptomatic but completely ambulatory (Restricted in physically strenuous activity but ambulatory and able to carry out work of a light or sedentary nature. For example, light housework, office work)  2 - Symptomatic, <50% in bed during the day (Ambulatory and capable of all self care but unable to carry out any work activities. Up and about more than 50% of waking hours)  3 - Symptomatic, >50% in bed, but not bedbound (Capable of only limited self-care, confined to bed or chair 50% or more of waking hours)  4 - Bedbound (Completely disabled. Cannot carry on any self-care. Totally confined to bed or chair)  5 - Death   Eustace Pen MM, Creech RH, Tormey DC, et al. [phone]). "Toxicity and response criteria of the [facility] Group". [place] Oncol. 5 (6): 649-55    LABORATORY DATA:  Lab Results  Component Value Date   WBC 7.1 02/18/2021   HGB 13.3 02/18/2021   HCT 41.2 02/18/2021   MCV 90.4 02/18/2021   PLT 244 02/18/2021   Lab Results  Component Value Date   NA 141 02/18/2021   K 4.2 02/18/2021   CL 103 02/18/2021   CO2 28 02/18/2021   Lab Results  Component Value Date   ALT 13 02/18/2021   AST 17 02/18/2021   ALKPHOS 87 02/18/2021   BILITOT 0.6 02/18/2021      RADIOGRAPHY: CT SOFT TISSUE NECK W CONTRAST  Result Date: 02/05/2021 CLINICAL DATA:  Provided history: Esophageal mass. Esophageal mass; proximal esophageal mass. EXAM: CT NECK WITH CONTRAST TECHNIQUE: Multidetector CT imaging of the neck was performed using the standard protocol following the bolus administration of intravenous contrast. CONTRAST:  28m OMNIPAQUE IOHEXOL 300 MG/ML  SOLN COMPARISON:  Esophagram 01/22/2021. FINDINGS: Pharynx and larynx: Small nonspecific foci of calcification within the left perimandibular soft tissues (for  instance as seen on series 3, image 52). Streak and beam hardening artifact arising from dental restoration partially obscures the oral cavity and oropharynx. No appreciable swelling or discrete mass within the oral cavity, nasopharynx, or oropharynx. Please see hypopharyngeal findings below. Salivary glands: No inflammation, mass, or stone. Thyroid: Multiple small bilateral thyroid nodules measuring up to 11 mm, not meeting consensus criteria for ultrasound follow-up based on size. Lymph nodes: Centrally necrotic right paratracheal lymph node beneath the level of the mass, measuring 1.3 x 0.9 cm and likely reflecting a site of nodal metastatic disease (series 3, image 87). There are additional lymph nodes along the posterior and inferior aspect of the right thyroid lobe, and along the right aspect of the esophageal mass, which measure subcentimeter but are asymmetrically prominent and highly suspicious for additional sites of nodal metastatic disease (for instance as seen on series 3, image 80). Vascular: The major vascular structures of the neck are patent. Atherosclerotic plaque within the visualized aortic arch and bilateral carotid arteries. Limited intracranial: Prior right retrosigmoid craniotomy. No evidence of acute intracranial mallet within the field of view. Visualized orbits: No mass or acute finding. Mastoids and visualized paranasal sinuses:  Minimal fluid within the right ethmoid air cells. Trace fluid within the right mastoid air cells. Skeleton: Cervical spondylosis. No acute bony abnormality or aggressive osseous lesion. Redemonstrated right retrosigmoid cranioplasty. Upper chest: No consolidation within the imaged lung apices. Other: Masslike soft tissue thickening and mucosal hyperenhancement of the cervical esophagus and hypopharynx, spanning the C4-T1 levels, likely reflecting an esophageal malignancy. This measures up to 2.4 x 3.4 cm in transaxial dimensions, and 6.3 cm in craniocaudal  dimension (for instance as seen on series 3, image 71) (series 6, image 41). Associated severe effacement of the esophageal lumen. IMPRESSION: Mass-like soft tissue thickening and mucosal hyperenhancement of the cervical esophagus and hypopharynx, spanning the C4-T1 levels, likely reflecting an esophageal malignancy. This measures up to 2.4 x 3.4 cm in transaxial dimensions, and 6.3 cm in craniocaudal dimension. Associated severe effacement of the esophageal lumen. Centrally necrotic right paratracheal lymph node beneath the level of the mass, measuring 1.3 x 0.9 cm, and likely reflecting a site of nodal metastatic disease. Additional lymph nodes along the posterior and inferior aspect of the right thyroid lobe (along the right aspect of the esophageal mass), which measure subcentimeter but are asymmetrically prominent and highly suspicious for additional sites of nodal metastatic disease. Consider a PET-CT for further staging. Electronically Signed   By: Kellie Simmering D.O.   On: 02/05/2021 14:32   DG ESOPHAGUS W DOUBLE CM (HD)  Addendum Date: 01/22/2021   ADDENDUM REPORT: 01/22/2021 12:59 ADDENDUM: Findings and recommendations conveyed to PA, Desmond Dike 01/22/2021 at12:58. Electronically Signed   By: Suzy Bouchard M.D.   On: 01/22/2021 12:59   Result Date: 01/22/2021 CLINICAL DATA:  Difficulty swallowing foods. Patient only able to tolerate baby food currently. Patient has limited mobility. EXAM: ESOPHAGUS/BARIUM SWALLOW/TABLET STUDY TECHNIQUE: Initial scout AP supine abdominal image obtained to insure adequate colon cleansing. Barium was introduced into the colon in a retrograde fashion and refluxed from the rectum to the cecum. Spot images of the colon followed by overhead radiographs were obtained. FLUOROSCOPY TIME:  Fluoroscopy Time:  1 minute 42 seconds Radiation Exposure Index (if provided by the fluoroscopic device): 1.7 mGy Number of Acquired Spot Images: 6 COMPARISON:  None. FINDINGS: Rapid  swallowing evaluation of the high cervical esophagus demonstrates irregular narrowing of the lumen over a 3 cm segment at the C5-C7 vertebral body levels. There is also prevertebral soft tissue swelling through this region. The evaluation the high cervical esophagus was initiated after patient had several choking episodes with normal esophagram inferiorly. The thoracic esophagus and distal esophagus are normal. No obstruction, mass or mucosal irregularity. IMPRESSION: 1. Luminal narrowing in the high cervical esophagus over a 3 cm segment is highly concerning for esophageal neoplasm. Inflammatory process would be a secondary consideration. Luminal narrowing occurs approximately at the C5-C7 vertebral body level just below the glottis. Patient experienced several episodes of choking related to this luminal narrowing. Recommend expedient upper GI endoscopy for evaluation. 2. Distal thoracic esophagus and GE junction appear normal. Electronically Signed: By: Suzy Bouchard M.D. On: 01/22/2021 12:20       IMPRESSION/PLAN: 1. 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 Dr. Lisbeth Renshaw discusses the pathology findings and reviews the nature of esophageal carcinoma. She needs to be seen by medical oncology and  we have ordered a PET scan for staging. This will be performed next Tuesday. Dr. Lisbeth Renshaw anticipates a course of chemoRT provided she does not have metastatic disease by PET scan. We discussed the risks, benefits, short, and long  term effects of radiotherapy, as well as the curative intent, and the patient is interested in proceeding. Dr. Lisbeth Renshaw discusses the delivery and logistics of radiotherapy and anticipates a course of 5 1/2 weeks of radiotherapy for locally advanced esophagus carcinoma. Written consent is obtained and placed in the chart, a copy was provided to the patient. She will be contacted by our team to coordinate simulation following her PET. We anticipate starting treatment on 03/01/21. We  will follow her course and check in with her regarding PET results. If she needs pain medication as well for her current symptoms we can discuss this further as needed.  2. Weight loss and moderate to severe protein calorie malnutrition. We discussed the options of a temporary gastrostomy tube for enteral nutrition. With the inability to traverse the tumor related stricturing of the esophagus, and the desire to avoid a major surgical approach in the face of upcoming treatment, it's felt that this should be placed by interventional radiology. I will discuss this with Dr. [name], and the final decision on this can be made under her guidance. She will also meet with the dietician here at the cancer center today. I also suggested she meet with her pharmacist to find out if her medications have alternative dosing routes such as orally dissolving preparations, or suspensions that her PCP could authorize.  3.  Trigeminal neuralgia. The patient completed definitive treatment with Dr. Vallarie Mare at [facility]. This will be followed expectantly. She appears to have some persistent inflammatory symptoms related to the treatment. However it seems that she is also noting improvement in the frequency and intensity of her symptoms since her treatment.   In a visit lasting 90 minutes, greater than 50% of the time was spent face to face discussing the patient's condition, in preparation for the discussion, and coordinating the patient's care.    The above documentation reflects my direct findings during this shared patient visit. Please see the separate note by Dr. Lisbeth Renshaw on this date for the remainder of the patient's plan of care.    Carola Rhine, [facility]   **Disclaimer: This note was dictated with voice recognition software. Similar sounding words can inadvertently be transcribed and this note may contain transcription errors which may not have been corrected upon publication of note.**

## 2021-02-19 ENCOUNTER — Encounter: Payer: Self-pay | Admitting: Hematology

## 2021-02-19 ENCOUNTER — Other Ambulatory Visit: Payer: Self-pay | Admitting: Hematology

## 2021-02-19 DIAGNOSIS — I10 Essential (primary) hypertension: Secondary | ICD-10-CM

## 2021-02-19 DIAGNOSIS — C153 Malignant neoplasm of upper third of esophagus: Secondary | ICD-10-CM

## 2021-02-19 DIAGNOSIS — M1990 Unspecified osteoarthritis, unspecified site: Secondary | ICD-10-CM

## 2021-02-19 MED ORDER — ONDANSETRON HCL 8 MG PO TABS: 8.0000 mg | ORAL_TABLET | Freq: Two times a day (BID) | ORAL | 1 refills | Status: DC | PRN

## 2021-02-19 MED ORDER — PROCHLORPERAZINE MALEATE 10 MG PO TABS: 10.0000 mg | ORAL_TABLET | Freq: Four times a day (QID) | ORAL | 1 refills | Status: DC | PRN

## 2021-02-19 NOTE — Progress Notes (Signed)
START ON PATHWAY REGIMEN - Gastroesophageal     Administer weekly during RT:     Paclitaxel      Carboplatin   **Always confirm dose/schedule in your pharmacy ordering system**  Patient Characteristics: Esophageal & GE Junction, Squamous Cell, Preoperative or Nonsurgical Candidate (Clinical Staging), cT2 or Higher or cN+, Unresectable/Nonsurgical Candidate (Any cT) Histology: Squamous Cell Disease Classification: Esophageal Therapeutic Status: Preoperative or Nonsurgical Candidate (Clinical Staging) AJCC M Category: cM0 AJCC 8 Stage Grouping: Unknown AJCC Grade: G2 AJCC Location: Upper AJCC T Category: cTX AJCC N Category: cNX Intent of Therapy: Curative Intent, Discussed with Patient

## 2021-02-19 NOTE — Progress Notes (Signed)
I left a vm for [name]. [facility] IR regarding scheduling PEG placement on 10/3 per Dr Serafina Royals.

## 2021-02-22 ENCOUNTER — Other Ambulatory Visit: Payer: Self-pay

## 2021-02-22 ENCOUNTER — Encounter: Payer: Self-pay | Admitting: Hematology

## 2021-02-22 ENCOUNTER — Other Ambulatory Visit: Payer: Self-pay | Admitting: Student

## 2021-02-22 ENCOUNTER — Ambulatory Visit (HOSPITAL_COMMUNITY)
Admission: RE | Admit: 2021-02-22 | Discharge: 2021-02-22 | Disposition: A | Discharge status: Home / Self Care | Payer: Medicare Other | Source: Ambulatory Visit | Attending: Nurse Practitioner | Admitting: Nurse Practitioner

## 2021-02-22 ENCOUNTER — Ambulatory Visit: Payer: Self-pay | Admitting: Surgery

## 2021-02-22 DIAGNOSIS — G500 Trigeminal neuralgia: Secondary | ICD-10-CM | POA: Diagnosis not present

## 2021-02-22 DIAGNOSIS — I10 Essential (primary) hypertension: Secondary | ICD-10-CM | POA: Insufficient documentation

## 2021-02-22 DIAGNOSIS — R634 Abnormal weight loss: Secondary | ICD-10-CM | POA: Diagnosis not present

## 2021-02-22 DIAGNOSIS — Z885 Allergy status to narcotic agent status: Secondary | ICD-10-CM | POA: Diagnosis not present

## 2021-02-22 DIAGNOSIS — C153 Malignant neoplasm of upper third of esophagus: Secondary | ICD-10-CM | POA: Diagnosis not present

## 2021-02-22 DIAGNOSIS — R1310 Dysphagia, unspecified: Secondary | ICD-10-CM | POA: Diagnosis not present

## 2021-02-22 DIAGNOSIS — Z882 Allergy status to sulfonamides status: Secondary | ICD-10-CM | POA: Diagnosis not present

## 2021-02-22 DIAGNOSIS — C159 Malignant neoplasm of esophagus, unspecified: Secondary | ICD-10-CM | POA: Diagnosis not present

## 2021-02-22 DIAGNOSIS — Z79899 Other long term (current) drug therapy: Secondary | ICD-10-CM | POA: Diagnosis not present

## 2021-02-22 HISTORY — PX: IR GASTROSTOMY TUBE MOD SED: IMG625

## 2021-02-22 LAB — CBC
HCT: 41.6 % (ref 36.0–46.0)
Hemoglobin: 13.3 g/dL (ref 12.0–15.0)
MCH: 29.5 pg (ref 26.0–34.0)
MCHC: 32 g/dL (ref 30.0–36.0)
MCV: 92.2 fL (ref 80.0–100.0)
Platelets: 207 10*3/uL (ref 150–400)
RBC: 4.51 MIL/uL (ref 3.87–5.11)
RDW: 13.2 % (ref 11.5–15.5)
WBC: 6.1 10*3/uL (ref 4.0–10.5)
nRBC: 0 % (ref 0.0–0.2)

## 2021-02-22 LAB — PROTIME-INR
INR: 1.1 (ref 0.8–1.2)
Prothrombin Time: 13.8 seconds (ref 11.4–15.2)

## 2021-02-22 MED ORDER — CEFAZOLIN SODIUM-DEXTROSE 2-4 GM/100ML-% IV SOLN: 2.0000 g | Freq: Once | INTRAVENOUS | Status: AC

## 2021-02-22 MED ORDER — STERILE WATER FOR INJECTION IJ SOLN
INTRAMUSCULAR | Status: AC
  Filled 2021-02-22: qty 10

## 2021-02-22 MED ORDER — MIDAZOLAM HCL 2 MG/2ML IJ SOLN
INTRAMUSCULAR | Status: DC | PRN
Start: 2021-02-22 — End: 2021-02-23
  Administered 2021-02-22 (×3): 1 mg via INTRAVENOUS

## 2021-02-22 MED ORDER — LIDOCAINE HCL 1 % IJ SOLN
INTRAMUSCULAR | Status: AC
  Filled 2021-02-22: qty 20

## 2021-02-22 MED ORDER — MIDAZOLAM HCL 2 MG/2ML IJ SOLN
INTRAMUSCULAR | Status: AC
  Filled 2021-02-22: qty 4

## 2021-02-22 MED ORDER — IOHEXOL 350 MG/ML SOLN: 100.0000 mL | Freq: Once | INTRAVENOUS | Status: DC | PRN

## 2021-02-22 MED ORDER — CEFAZOLIN SODIUM-DEXTROSE 2-4 GM/100ML-% IV SOLN
INTRAVENOUS | Status: AC
  Administered 2021-02-22: 2 g via INTRAVENOUS
  Filled 2021-02-22: qty 100

## 2021-02-22 MED ORDER — CEFAZOLIN SODIUM-DEXTROSE 2-4 GM/100ML-% IV SOLN
INTRAVENOUS | Status: AC
  Filled 2021-02-22: qty 100

## 2021-02-22 MED ORDER — FENTANYL CITRATE (PF) 100 MCG/2ML IJ SOLN
INTRAMUSCULAR | Status: DC | PRN
  Administered 2021-02-22: 50 ug via INTRAVENOUS
  Administered 2021-02-22 (×2): 25 ug via INTRAVENOUS

## 2021-02-22 MED ORDER — FENTANYL CITRATE (PF) 100 MCG/2ML IJ SOLN
INTRAMUSCULAR | Status: AC
  Filled 2021-02-22: qty 4

## 2021-02-22 MED ORDER — SODIUM CHLORIDE 0.9 % IV SOLN: INTRAVENOUS | Status: DC

## 2021-02-22 MED ORDER — GLUCAGON HCL RDNA (DIAGNOSTIC) 1 MG IJ SOLR
INTRAMUSCULAR | Status: AC
  Filled 2021-02-22: qty 1

## 2021-02-22 MED ORDER — LIDOCAINE VISCOUS HCL 2 % MT SOLN
OROMUCOSAL | Status: AC
  Administered 2021-02-22: 1 mL
  Filled 2021-02-22: qty 15

## 2021-02-22 NOTE — Sedation Documentation (Signed)
Dr. Serafina Royals at bedside to discuss plan of care with pt and husband.

## 2021-02-22 NOTE — Procedures (Signed)
Interventional Radiology Procedure Note  Procedure: Limited esophagram  Findings: Please refer to procedural dictation for full description.  Unable to pass 4 Fr glide catheter beyond proximal esophageal stricture, therefor unable to place gastrostomy tube.  Complications: None immediate  Estimated Blood Loss: < 5 mL  Recommendations: Unable to insufflate stomach for safe percutaneous gastrostomy tube placement.  Recommend consideration of surgically placed gastrostomy.   Ruthann Cancer, MD Pager: [phone]

## 2021-02-22 NOTE — H&P (Signed)
Chief Complaint: Patient was seen in consultation today for percutaneous gastrostomy tube   Referring Physician(s): [name]  Supervising Physician: Ruthann Cancer  Patient Status: [facility] - Out-pt  History of Present Illness: [name] is an 80 y.o. female with a medical history significant for HTN, trigeminal neuralgia (01/05/21 treatment at [facility]), syncope and newly diagnosed esophageal cancer. She has experienced dysphagia since July 2022 and work up has revealed an upper esophageal mass.  CT soft tissue neck with contrast 02/05/21 IMPRESSION: 1. Mass-like soft tissue thickening and mucosal hyperenhancement of the cervical esophagus and hypopharynx, spanning the C4-T1 levels, likely reflecting an esophageal malignancy. This measures up to 2.4 x 3.4 cm in transaxial dimensions, and 6.3 cm in craniocaudal dimension. Associated severe effacement of the esophageal lumen. 2. Centrally necrotic right paratracheal lymph node beneath the level of the mass, measuring 1.3 x 0.9 cm, and likely reflecting a site of nodal metastatic disease. 3. Additional lymph nodes along the posterior and inferior aspect of the right thyroid lobe (along the right aspect of the esophageal mass), which measure subcentimeter but are asymmetrically prominent and highly suspicious for additional sites of nodal metastatic disease. Consider a PET-CT for further staging.   She has lost over 10 pounds and is unable to consume solid foods. She has pending plans for chemotherapy and radiation therapy. Interventional Radiology has been asked to evaluate this patient for an image-guided gastrostomy tube placement to facilitate her long-term nutritional needs. She met with a Registered Dietician 02/18/21 to discuss outpatient gastrostomy tube management, nutrition needs/goals, etc.     Past Medical History:  Diagnosis Date   Abdominal pain 05/29/2013   s/p rx of cephalo resistant e coli   but last rx NG  now  residular ?  bladder sx repeat cx sx rx to ty and uro consult    ADJ DISORDER WITH MIXED ANXIETY & DEPRESSED MOOD 03/03/2010   Qualifier: Diagnosis of  By: [name], [name]    Agent resistant to multiple antibiotics 05/29/2013   e coli   bu NG on fu.     Anxiety    ARF (acute renal failure) ([place]) 03/12/2015   Closed head injury 02/01/2011   from syncope and had scalp laceration  neg ct .     Closed head injury 5-6 yrs ago   Colitis 48/18/5631   Complication of anesthesia    migraine several hours after general anesthesia   Depression    Fatty liver    Gall stones 2016   see ct scan neg HIDA    GERD (gastroesophageal reflux disease)    Hearing aid worn    HOH (hard of hearing)    both ears   Hyperlipidemia    Hypertension    echo nl lv function  mild dilitation 2009   Kidney infection    few yrs ago in hospital   Medication side effect 09/02/2010   Poss muscle se of 10 crestor    Migraine    hypnic HA eval by Dr. 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    Pyelonephritis 03/12/2015   Sensation of pain in anesthetized distribution of trigeminal nerve    Syncope 02/01/2011   In shower on vacation  sustained head laceration  8 sutures Had ed visit neg head ct labs and x ray    Trigeminal neuralgia pain     Past Surgical History:  Procedure Laterality Date  ABDOMINAL HYSTERECTOMY  2002   tubal   BACK SURGERY     2 times, for sciatic nerve pain   CARDIAC CATHETERIZATION  2000   chest pains neg   CHOLECYSTECTOMY N/A 02/21/2017   Procedure: LAPAROSCOPIC CHOLECYSTECTOMY WITH INTRAOPERATIVE CHOLANGIOGRAM;  Surgeon: Armandina Gemma, MD;  Location: [facility];  Service: General;  Laterality: N/A;   COLONOSCOPY     multiple   CRANIOTOMY  12/09/2011   nerve decompression right trigeminal    DIRECT LARYNGOSCOPY N/A 02/12/2021   Procedure: DIRECT LARYNGOSCOPY AND BIOPSY POSSIBLE FROZEN;  Surgeon: [name], MD;  Location: [place];  Service: ENT;  Laterality: N/A;   DOPPLER ECHOCARDIOGRAPHY  2009   nl lv function mild lv dilitation   EYE SURGERY Bilateral    ioc for catatracts   laparoscopic gallbladder surgery  02/16/2017   Fax from [place] Bilateral 2002   rt shoulder surgery      Allergies: Hydrocodone and Sulfamethoxazole-trimethoprim  Medications: Prior to Admission medications   Medication Sig Start Date End Date Taking? Authorizing Provider  acetaminophen (TYLENOL) 325 MG tablet Take 650 mg by mouth every 6 (six) hours as needed for moderate pain.   Yes [provider]  benazepril (LOTENSIN) 20 MG tablet Take 1 tablet (20 mg total) by mouth 2 (two) times daily. 11/02/20  Yes [name], [name], MD  Calcium Carb-Cholecalciferol (CALCIUM 600+D) 600-800 MG-UNIT TABS Take 1 tablet by mouth 2 (two) times daily.   Yes [provider]  carvedilol (COREG) 25 MG tablet TAKE 1 TABLET(25 MG) BY MOUTH TWICE DAILY WITH A MEAL 12/28/20  Yes [name], [name], MD  conjugated estrogens (PREMARIN) vaginal cream Place 1 applicator vaginally 2 (two) times a week.   Yes [provider]  fluticasone (FLONASE) 50 MCG/ACT nasal spray Place 2 sprays into both nostrils as needed for allergies or rhinitis. 02/11/20  Yes [name], [name], MD  furosemide (LASIX) 40 MG tablet Take 1 tablet (40 mg total) by mouth daily. 11/17/20  Yes [name], [name], MD  lamoTRIgine (LAMICTAL) 100 MG tablet Take 2 pills in the morning(239m) and evening. May take an additional 1/2 pill(575m if needed with the morning and evening doses Patient taking differently: Takes 100 mg in the morning and 150 mg in the evening. 10/20/20  Yes AhMelvenia BeamMD  Multiple Vitamin (MULTIVITAMIN ADULT PO) Take 1 tablet by mouth daily.   Yes [provider]  omeprazole (PRILOSEC) 20 MG capsule Take 1 capsule (20 mg total) by mouth daily. Patient taking differently: Take 20 mg by mouth daily as needed (acid  reflux). 01/12/21  Yes [name], WaStandley BrookingMD  Oxcarbazepine (TRILEPTAL) 300 MG tablet Take 0.5 tablets (150 mg total) by mouth 2 (two) times daily. 11/25/20  Yes AhMelvenia BeamMD  Polyethyl Glycol-Propyl Glycol (SYSTANE) 0.4-0.3 % GEL ophthalmic gel Place 1 application into both eyes 2 (two) times daily as needed (dry eyes).   Yes [provider]  alendronate (FOSAMAX) 70 MG tablet TAKE 1 TABLET BY MOUTH 1 TIME A WEEK WITH FULL GLASS OF WATER AND ON AN EMPTY STOMACH AS DIRECTED Patient not taking: No sig reported 10/26/20   [name], WaStandley BrookingMD  ondansetron (ZOFRAN) 8 MG tablet Take 1 tablet (8 mg total) by mouth 2 (two) times daily as needed for refractory nausea / vomiting. Start on day 3 after chemo. Patient not taking: No sig reported 02/19/21   FeTruitt MerleMD  ondansetron (  ZOFRAN-ODT) 4 MG disintegrating tablet Take 1-2 tablets (4-8 mg total) by mouth every 8 (eight) hours as needed for nausea. Patient not taking: Reported on 02/19/2021 09/04/19   [name], MD  prochlorperazine (COMPAZINE) 10 MG tablet Take 1 tablet (10 mg total) by mouth every 6 (six) hours as needed (Nausea or vomiting). Patient not taking: No sig reported 02/19/21   [name], MD     Family History  Problem Relation Age of Onset   Cancer Mother        uterine   Ovarian cancer Mother    Stroke Mother    Alcohol abuse Father    Stroke Father    Diabetes Brother    Cancer Paternal Aunt        leukemia, unknown type   Seizures Daughter    Hypertension Other    Colon cancer Neg Hx    Dementia Neg Hx    Alzheimer's disease Neg Hx     Social History   Socioeconomic History   Marital status: Married    Spouse name: Not on file   Number of children: 2   Years of education: Not on file   Highest education level: Not on file  Occupational History    Comment: retired Forensic psychologist  Tobacco Use   Smoking status: Never   Smokeless tobacco: Never  Vaping Use   Vaping Use: Never used  Substance and  Sexual Activity   Alcohol use: Not Currently    Alcohol/week: 2.0 standard drinks    Types: 2 Glasses of wine per week    Comment: occ wine   Drug use: No   Sexual activity: Not on file  Other Topics Concern   Not on file  Social History Narrative   Married   HH of 2-3 (god daughter)   Pets 2 dogs   Non smoker    Child is a Engineer, drilling   G2P2      Caffeine: 2 cups/day   Social Determinants of Radio broadcast assistant Strain: Low Risk    Difficulty of Paying Living Expenses: Not hard at all  Food Insecurity: No Food Insecurity   Worried About Charity fundraiser in the Last Year: Never true   [place] in the Last Year: Never true  Transportation Needs: No Transportation Needs   Lack of Transportation (Medical): No   Lack of Transportation (Non-Medical): No  Physical Activity: Inactive   Days of Exercise per Week: 0 days   Minutes of Exercise per Session: 0 min  Stress: No Stress Concern Present   Feeling of Stress : Not at all  Social Connections: Moderately Integrated   Frequency of Communication with Friends and Family: More than three times a week   Frequency of Social Gatherings with Friends and Family: Once a week   Attends Religious Services: 1 to 4 times per year   Active Member of [employer] or Organizations: No   Attends Archivist Meetings: Never   Marital Status: Married    Review of Systems: A 12 point ROS discussed and pertinent positives are indicated in the HPI above.  All other systems are negative.  Review of Systems  Constitutional:  Positive for appetite change and fatigue.  HENT:  Positive for sore throat and trouble swallowing.   Respiratory:  Negative for cough and shortness of breath.   Gastrointestinal:  Negative for abdominal pain, diarrhea, nausea and vomiting.  Neurological:  Negative for dizziness and headaches.  Vital Signs: BP (!) 173/82   Pulse 64   Temp 97.9 F (36.6 C) (Oral)   Resp 14   Ht _0  (1.676 m)   Wt  120 lb (54.4 kg)   SpO2 98%   BMI 19.37 kg/m   Physical Exam Constitutional:      General: She is not in acute distress.    Appearance: She is underweight.  HENT:     Mouth/Throat:     Mouth: Mucous membranes are moist.     Pharynx: Oropharynx is clear.  Cardiovascular:     Rate and Rhythm: Normal rate and regular rhythm.     Pulses: Normal pulses.     Heart sounds: Normal heart sounds.  Pulmonary:     Effort: Pulmonary effort is normal.     Breath sounds: Normal breath sounds.  Abdominal:     General: Bowel sounds are normal.     Palpations: Abdomen is soft.     Tenderness: There is no abdominal tenderness.  Skin:    General: Skin is warm and dry.  Neurological:     Mental Status: She is alert and oriented to person, place, and time.    Imaging: CT SOFT TISSUE NECK W CONTRAST  Result Date: 02/05/2021 CLINICAL DATA:  Provided history: Esophageal mass. Esophageal mass; proximal esophageal mass. EXAM: CT NECK WITH CONTRAST TECHNIQUE: Multidetector CT imaging of the neck was performed using the standard protocol following the bolus administration of intravenous contrast. CONTRAST:  13m OMNIPAQUE IOHEXOL 300 MG/ML  SOLN COMPARISON:  Esophagram 01/22/2021. FINDINGS: Pharynx and larynx: Small nonspecific foci of calcification within the left perimandibular soft tissues (for instance as seen on series 3, image 52). Streak and beam hardening artifact arising from dental restoration partially obscures the oral cavity and oropharynx. No appreciable swelling or discrete mass within the oral cavity, nasopharynx, or oropharynx. Please see hypopharyngeal findings below. Salivary glands: No inflammation, mass, or stone. Thyroid: Multiple small bilateral thyroid nodules measuring up to 11 mm, not meeting consensus criteria for ultrasound follow-up based on size. Lymph nodes: Centrally necrotic right paratracheal lymph node beneath the level of the mass, measuring 1.3 x 0.9 cm and likely reflecting  a site of nodal metastatic disease (series 3, image 87). There are additional lymph nodes along the posterior and inferior aspect of the right thyroid lobe, and along the right aspect of the esophageal mass, which measure subcentimeter but are asymmetrically prominent and highly suspicious for additional sites of nodal metastatic disease (for instance as seen on series 3, image 80). Vascular: The major vascular structures of the neck are patent. Atherosclerotic plaque within the visualized aortic arch and bilateral carotid arteries. Limited intracranial: Prior right retrosigmoid craniotomy. No evidence of acute intracranial mallet within the field of view. Visualized orbits: No mass or acute finding. Mastoids and visualized paranasal sinuses: Minimal fluid within the right ethmoid air cells. Trace fluid within the right mastoid air cells. Skeleton: Cervical spondylosis. No acute bony abnormality or aggressive osseous lesion. Redemonstrated right retrosigmoid cranioplasty. Upper chest: No consolidation within the imaged lung apices. Other: Masslike soft tissue thickening and mucosal hyperenhancement of the cervical esophagus and hypopharynx, spanning the C4-T1 levels, likely reflecting an esophageal malignancy. This measures up to 2.4 x 3.4 cm in transaxial dimensions, and 6.3 cm in craniocaudal dimension (for instance as seen on series 3, image 71) (series 6, image 41). Associated severe effacement of the esophageal lumen. IMPRESSION: Mass-like soft tissue thickening and mucosal hyperenhancement of the cervical esophagus and hypopharynx, spanning  the C4-T1 levels, likely reflecting an esophageal malignancy. This measures up to 2.4 x 3.4 cm in transaxial dimensions, and 6.3 cm in craniocaudal dimension. Associated severe effacement of the esophageal lumen. Centrally necrotic right paratracheal lymph node beneath the level of the mass, measuring 1.3 x 0.9 cm, and likely reflecting a site of nodal metastatic disease.  Additional lymph nodes along the posterior and inferior aspect of the right thyroid lobe (along the right aspect of the esophageal mass), which measure subcentimeter but are asymmetrically prominent and highly suspicious for additional sites of nodal metastatic disease. Consider a PET-CT for further staging. Electronically Signed   By: Kellie Simmering D.O.   On: 02/05/2021 14:32    Labs:  CBC: Recent Labs    11/26/20 1105 02/04/21 1531 02/18/21 1315 02/22/21 1039  WBC 6.8 5.3 7.1 6.1  HGB 14.1 13.3 13.3 13.3  HCT 44.3 40.4 41.2 41.6  PLT 222 205.0 244 207    COAGS: Recent Labs    05/16/20 0520 02/22/21 1118  INR 1.1 1.1  APTT 36  --     BMP: Recent Labs    05/11/20 1330 05/15/20 2239 05/16/20 0520 07/01/20 1449 07/08/20 1653 10/01/20 1001 10/05/20 1718 11/05/20 1141 11/26/20 1105 02/04/21 1531 02/18/21 1315  NA 140   < > 139   < > 140   < > 139 136 142 141 141  K 4.3   < > 3.5   < > 4.3   < > 4.8 4.4 4.5 3.5 4.2  CL 98   < > 102   < > 99   < > 100 97 97 100 103  CO2 27   < > 26   < > 26   < > 32 29 30* 36* 28  GLUCOSE 97   < > 105*   < > 93   < > 132* 94 100* 84 87  BUN 18   < > 12   < > 18   < > _0 26*  CALCIUM 9.3   < > 8.4*   < > 9.8   < > 9.9 9.7 10.5* 10.4 10.2  CREATININE 0.87   < > 0.80   < > 0.99   < > 1.11* 1.06* 1.11* 0.92 0.85  GFRNONAA 63   < > >60  --  54*  --  50*  --   --   --  >60  GFRAA 73  --   --   --  62  --   --   --   --   --   --    < > = values in this interval not displayed.    LIVER FUNCTION TESTS: Recent Labs    11/05/20 1141 11/26/20 1105 02/04/21 1531 02/18/21 1315  BILITOT 0.6 0.6 0.7 0.6  AST _1 ALT _2 ALKPHOS 86 81 82 87  PROT 6.4 6.6 6.6 6.8  ALBUMIN 4.4 4.5 3.7 3.6    TUMOR MARKERS: No results for input(s): AFPTM, CEA, CA199, CHROMGRNA in the last 8760 hours.  Assessment and Plan:  Esophageal cancer; dysphagia: Angela Shibuya. Barrera, 80 year old female, presents today to the [facility] department for an image-guided gastrostomy tube.   Risks and benefits image guided gastrostomy tube placement were discussed with the patient including, but not limited to the need for a barium enema during the procedure, bleeding, infection, peritonitis and/or damage to adjacent structures.  All of  the patient's questions were answered, patient is agreeable to proceed. She has been NPO. Labs and vitals have been reviewed.   Consent signed and in chart.  Thank you for this interesting consult.  I greatly enjoyed meeting [name] and look forward to participating in their care.  A copy of this report was sent to the requesting provider on this date.  Electronically Signed: Soyla Dryer, AGACNP-BC [phone] 02/22/2021, 11:46 AM   I spent a total of  30 Minutes   in face to face in clinical consultation, greater than 50% of which was counseling/coordinating care for gastrostomy tube placement.

## 2021-02-22 NOTE — Progress Notes (Addendum)
1315 Attempted to call for report/ voice mail box not set up. RN  for IR [id number] attempted to call again. Same result.

## 2021-02-22 NOTE — Sedation Documentation (Signed)
Attempted to call report to Short Stay.

## 2021-02-22 NOTE — Sedation Documentation (Signed)
Pt d/c via WC to care of husband. Discharge instructions and plan of care reviewed with pt and husband with verbal understanding. No c/o.

## 2021-02-23 ENCOUNTER — Ambulatory Visit (HOSPITAL_COMMUNITY)
Admission: RE | Admit: 2021-02-23 | Discharge: 2021-02-23 | Disposition: A | Discharge status: Home / Self Care | Payer: Medicare Other | Source: Ambulatory Visit | Attending: Radiation Oncology | Admitting: Radiation Oncology

## 2021-02-23 DIAGNOSIS — I700 Atherosclerosis of aorta: Secondary | ICD-10-CM | POA: Insufficient documentation

## 2021-02-23 DIAGNOSIS — C153 Malignant neoplasm of upper third of esophagus: Secondary | ICD-10-CM | POA: Diagnosis not present

## 2021-02-23 DIAGNOSIS — C159 Malignant neoplasm of esophagus, unspecified: Secondary | ICD-10-CM | POA: Diagnosis not present

## 2021-02-23 LAB — GLUCOSE, CAPILLARY: Glucose-Capillary: 97 mg/dL (ref 70–99)

## 2021-02-23 MED ORDER — FLUDEOXYGLUCOSE F - 18 (FDG) INJECTION
5.9000 | Freq: Once | INTRAVENOUS | Status: AC | PRN
  Administered 2021-02-23: 5.9 via INTRAVENOUS

## 2021-02-23 NOTE — Patient Instructions (Signed)
DUE TO COVID-19 ONLY ONE VISITOR IS ALLOWED TO COME WITH YOU AND STAY IN THE WAITING ROOM ONLY DURING PRE OP AND PROCEDURE.   **NO VISITORS ARE ALLOWED IN THE SHORT STAY AREA OR RECOVERY ROOM!!**  IF YOU WILL BE ADMITTED INTO THE HOSPITAL YOU ARE ALLOWED ONLY TWO SUPPORT PEOPLE DURING VISITATION HOURS ONLY (10AM -8PM)   The support person(s) may change daily. The support person(s) must pass our screening, gel in and out, and wear a mask at all times, including in the patient's room. Patients must also wear a mask when staff or their support person are in the room.  No visitors under the age of 70. Any visitor under the age of 69 must be accompanied by an adult.        Your procedure is scheduled on: 02/26/21   Report to [facility] Main Entrance    Report to admitting at : 7:45 AM   Call this number if you have problems the morning of surgery [phone]   Do not eat food :After Midnight.   May have liquids until : 7:00 AM   day of surgery  CLEAR LIQUID DIET  Foods Allowed                                                                     Foods Excluded  Water, Black Coffee and tea, regular and decaf                             liquids that you cannot  Plain Jell-O in any flavor  (No red)                                           see through such as: Fruit ices (not with fruit pulp)                                     milk, soups, orange juice              Iced Popsicles (No red)                                    All solid food                                   Apple juices Sports drinks like Gatorade (No red) Lightly seasoned clear broth or consume(fat free) Sugar,  Sample Menu Breakfast                                Lunch                                     Supper Cranberry juice  Beef broth                            Chicken broth Jell-O                                     Grape juice                           Apple juice Coffee or tea                         Jell-O                                      Popsicle                                                Coffee or tea                        Coffee or tea     Oral Hygiene is also important to reduce your risk of infection.                                    Remember - BRUSH YOUR TEETH THE MORNING OF SURGERY WITH YOUR REGULAR TOOTHPASTE   Do NOT smoke after Midnight   Take these medicines the morning of surgery with A SIP OF WATER: lamotrigine,trileptal,carvedilol.Omeprazole as needed.  DO NOT TAKE ANY ORAL DIABETIC MEDICATIONS DAY OF YOUR SURGERY                              You may not have any metal on your body including hair pins, jewelry, and body piercing             Do not wear make-up, lotions, powders, perfumes/cologne, or deodorant  Do not wear nail polish including gel and S&S, artificial/acrylic nails, or any other type of covering on natural nails including finger and toenails. If you have artificial nails, gel coating, etc. that needs to be removed by a nail salon please have this removed prior to surgery or surgery may need to be canceled/ delayed if the surgeon/ anesthesia feels like they are unable to be safely monitored.   Do not shave  48 hours prior to surgery.    Do not bring valuables to the hospital. [place].   Contacts, dentures or bridgework may not be worn into surgery.   Bring small overnight bag day of surgery.    Patients discharged on the day of surgery will not be allowed to drive home.   Special Instructions: Bring a copy of your healthcare power of attorney and living will documents         the day of surgery if you haven't scanned them before.  Please read over the following fact sheets you were given: IF YOU HAVE QUESTIONS ABOUT YOUR PRE-OP INSTRUCTIONS PLEASE CALL [phone]   [facility] - Preparing for Surgery Before surgery, you can play an important role.  Because skin  is not sterile, your skin needs to be as free of germs as possible.  You can reduce the number of germs on your skin by washing with CHG (chlorahexidine gluconate) soap before surgery.  CHG is an antiseptic cleaner which kills germs and bonds with the skin to continue killing germs even after washing. Please DO NOT use if you have an allergy to CHG or antibacterial soaps.  If your skin becomes reddened/irritated stop using the CHG and inform your nurse when you arrive at Short Stay. Do not shave (including legs and underarms) for at least 48 hours prior to the first CHG shower.  You may shave your face/neck. Please follow these instructions carefully:  1.  Shower with CHG Soap the night before surgery and the  morning of Surgery.  2.  If you choose to wash your hair, wash your hair first as usual with your  normal  shampoo.  3.  After you shampoo, rinse your hair and body thoroughly to remove the  shampoo.                           4.  Use CHG as you would any other liquid soap.  You can apply chg directly  to the skin and wash                       Gently with a scrungie or clean washcloth.  5.  Apply the CHG Soap to your body ONLY FROM THE NECK DOWN.   Do not use on face/ open                           Wound or open sores. Avoid contact with eyes, ears mouth and genitals (private parts).                       Wash face,  Genitals (private parts) with your normal soap.             6.  Wash thoroughly, paying special attention to the area where your surgery  will be performed.  7.  Thoroughly rinse your body with warm water from the neck down.  8.  DO NOT shower/wash with your normal soap after using and rinsing off  the CHG Soap.                9.  Pat yourself dry with a clean towel.            10.  Wear clean pajamas.            11.  Place clean sheets on your bed the night of your first shower and do not  sleep with pets. Day of Surgery : Do not apply any lotions/deodorants the morning of  surgery.  Please wear clean clothes to the hospital/surgery center.  FAILURE TO FOLLOW THESE INSTRUCTIONS MAY RESULT IN THE CANCELLATION OF YOUR SURGERY PATIENT SIGNATURE_________________________________  NURSE SIGNATURE__________________________________  ________________________________________________________________________

## 2021-02-23 NOTE — Progress Notes (Signed)
Pharmacist Chemotherapy Monitoring - Initial Assessment    Anticipated start date: 03/02/21   The following has been reviewed per standard work regarding the patient's treatment regimen: The patient's diagnosis, treatment plan and drug doses, and organ/hematologic function Lab orders and baseline tests specific to treatment regimen  The treatment plan start date, drug sequencing, and pre-medications Prior authorization status  Patient's documented medication list, including drug-drug interaction screen and prescriptions for anti-emetics and supportive care specific to the treatment regimen The drug concentrations, fluid compatibility, administration routes, and timing of the medications to be used The patient's access for treatment and lifetime cumulative dose history, if applicable  The patient's medication allergies and previous infusion related reactions, if applicable   Changes made to treatment plan:  N/A  Follow up needed:  Pending authorization for treatment    Kennith Center, Pharm.D., CPP 02/23/2021@5 :06 PM

## 2021-02-24 ENCOUNTER — Ambulatory Visit
Admission: RE | Admit: 2021-02-24 | Discharge: 2021-02-24 | Disposition: A | Discharge status: Home / Self Care | Payer: Medicare Other | Source: Ambulatory Visit | Attending: Radiation Oncology | Admitting: Radiation Oncology

## 2021-02-24 ENCOUNTER — Encounter: Payer: Self-pay | Admitting: Nurse Practitioner

## 2021-02-24 ENCOUNTER — Other Ambulatory Visit: Payer: Self-pay

## 2021-02-24 ENCOUNTER — Telehealth: Payer: Self-pay | Admitting: Radiation Oncology

## 2021-02-24 ENCOUNTER — Inpatient Hospital Stay: Payer: Medicare Other | Attending: Nurse Practitioner

## 2021-02-24 DIAGNOSIS — R634 Abnormal weight loss: Secondary | ICD-10-CM | POA: Insufficient documentation

## 2021-02-24 DIAGNOSIS — C153 Malignant neoplasm of upper third of esophagus: Secondary | ICD-10-CM | POA: Insufficient documentation

## 2021-02-24 DIAGNOSIS — R1310 Dysphagia, unspecified: Secondary | ICD-10-CM | POA: Insufficient documentation

## 2021-02-24 DIAGNOSIS — Z510 Encounter for antineoplastic radiation therapy: Secondary | ICD-10-CM | POA: Insufficient documentation

## 2021-02-24 DIAGNOSIS — M792 Neuralgia and neuritis, unspecified: Secondary | ICD-10-CM | POA: Insufficient documentation

## 2021-02-24 DIAGNOSIS — C159 Malignant neoplasm of esophagus, unspecified: Secondary | ICD-10-CM | POA: Diagnosis not present

## 2021-02-24 DIAGNOSIS — I10 Essential (primary) hypertension: Secondary | ICD-10-CM | POA: Insufficient documentation

## 2021-02-24 DIAGNOSIS — Z5111 Encounter for antineoplastic chemotherapy: Secondary | ICD-10-CM | POA: Insufficient documentation

## 2021-02-24 NOTE — Progress Notes (Signed)
Met with patient/accompanying adult to introduce myself as Arboriculturist and to offer available resources.  Discussed one-time $1000 Radio broadcast assistant to assist with personal expenses while going through treatment. Based on verbal income guidelines, they exceed the allowed income amount.  They have my card for any additional financial questions or concerns.

## 2021-02-24 NOTE — Telephone Encounter (Signed)
I called and left a message, reviewing the patient's PET scan with she and her husband. Her cas was discussed in GI oncology conference today and we will proceed with ChemoRT and cover her tumor and regional nodes detected by PET imaging. She will have her PEG tube placed by Dr. Harlow Asa and due to needing this procedure, Dr. [name] has requested that we delay her start of treatment until 03/08/21. They are in agreement with this plan.

## 2021-02-24 NOTE — Progress Notes (Signed)
The proposed treatment discussed in conference is for discussion purpose only and is not a binding recommendation.  The patients have not been physically examined, or presented with their treatment options.  Therefore, final treatment plans cannot be decided.  

## 2021-02-25 ENCOUNTER — Other Ambulatory Visit: Payer: Self-pay

## 2021-02-25 ENCOUNTER — Encounter (HOSPITAL_COMMUNITY)
Admission: RE | Admit: 2021-02-25 | Discharge: 2021-02-25 | Disposition: A | Discharge status: Home / Self Care | Payer: Medicare Other | Source: Ambulatory Visit | Attending: Surgery | Admitting: Surgery

## 2021-02-25 ENCOUNTER — Encounter (HOSPITAL_COMMUNITY): Payer: Self-pay

## 2021-02-25 DIAGNOSIS — Z01818 Encounter for other preprocedural examination: Secondary | ICD-10-CM | POA: Diagnosis not present

## 2021-02-25 HISTORY — DX: Anemia, unspecified: D64.9

## 2021-02-25 HISTORY — DX: Malignant (primary) neoplasm, unspecified: C80.1

## 2021-02-25 MED ORDER — SODIUM CHLORIDE 0.9 % IV SOLN
1.0000 g | INTRAVENOUS | Status: AC
  Administered 2021-02-26: 1 g via INTRAVENOUS
  Filled 2021-02-25: qty 1

## 2021-02-25 NOTE — Progress Notes (Signed)
COVID Vaccine Completed: Yes Date COVID Vaccine completed: 06/22/19 COVID vaccine manufacturer: Pfizer   x 2 COVID Test: 02/27/20  PCP - Dr. [name] Cardiologist - Dr. [name]. LOV: 03/05/20  Chest x-ray - 05/16/20 EKG - 05/16/20 Stress Test -  ECHO - 12/18/19 Cardiac Cath -  Pacemaker/ICD device last checked:  Sleep Study -  CPAP -   Fasting Blood Sugar -  Checks Blood Sugar _____ times a day  Blood Thinner Instructions: Aspirin Instructions: Last Dose:  Anesthesia review: Hx: HTN  Patient denies shortness of breath, fever, cough and chest pain at PAT appointment   Patient verbalized understanding of instructions that were given to them at the PAT appointment. Patient was also instructed that they will need to review over the PAT instructions again at home before surgery.

## 2021-02-26 ENCOUNTER — Encounter (HOSPITAL_COMMUNITY)
Admission: RE | Disposition: A | Discharge status: Home / Self Care | Payer: Self-pay | Source: Home / Self Care | Attending: Surgery

## 2021-02-26 ENCOUNTER — Ambulatory Visit (HOSPITAL_COMMUNITY): Payer: Medicare Other | Admitting: Registered Nurse

## 2021-02-26 ENCOUNTER — Telehealth: Payer: Self-pay | Admitting: Pharmacist

## 2021-02-26 ENCOUNTER — Encounter (HOSPITAL_COMMUNITY): Payer: Self-pay | Admitting: Surgery

## 2021-02-26 ENCOUNTER — Ambulatory Visit (HOSPITAL_COMMUNITY)
Admission: RE | Admit: 2021-02-26 | Discharge: 2021-02-27 | Disposition: A | Discharge status: Home / Self Care | Payer: Medicare Other | Attending: Surgery | Admitting: Surgery

## 2021-02-26 DIAGNOSIS — Z885 Allergy status to narcotic agent status: Secondary | ICD-10-CM | POA: Diagnosis not present

## 2021-02-26 DIAGNOSIS — E785 Hyperlipidemia, unspecified: Secondary | ICD-10-CM | POA: Diagnosis not present

## 2021-02-26 DIAGNOSIS — C153 Malignant neoplasm of upper third of esophagus: Secondary | ICD-10-CM | POA: Diagnosis present

## 2021-02-26 DIAGNOSIS — Z882 Allergy status to sulfonamides status: Secondary | ICD-10-CM | POA: Diagnosis not present

## 2021-02-26 DIAGNOSIS — Z20822 Contact with and (suspected) exposure to covid-19: Secondary | ICD-10-CM | POA: Insufficient documentation

## 2021-02-26 DIAGNOSIS — Z79899 Other long term (current) drug therapy: Secondary | ICD-10-CM | POA: Insufficient documentation

## 2021-02-26 DIAGNOSIS — E43 Unspecified severe protein-calorie malnutrition: Secondary | ICD-10-CM | POA: Insufficient documentation

## 2021-02-26 DIAGNOSIS — C159 Malignant neoplasm of esophagus, unspecified: Secondary | ICD-10-CM | POA: Diagnosis not present

## 2021-02-26 DIAGNOSIS — K219 Gastro-esophageal reflux disease without esophagitis: Secondary | ICD-10-CM | POA: Diagnosis not present

## 2021-02-26 DIAGNOSIS — I10 Essential (primary) hypertension: Secondary | ICD-10-CM | POA: Diagnosis not present

## 2021-02-26 DIAGNOSIS — R1310 Dysphagia, unspecified: Secondary | ICD-10-CM

## 2021-02-26 HISTORY — PX: GASTROSTOMY: SHX5249

## 2021-02-26 LAB — CBC
HCT: 40.3 % (ref 36.0–46.0)
Hemoglobin: 13.1 g/dL (ref 12.0–15.0)
MCH: 29.6 pg (ref 26.0–34.0)
MCHC: 32.5 g/dL (ref 30.0–36.0)
MCV: 91.2 fL (ref 80.0–100.0)
Platelets: 195 10*3/uL (ref 150–400)
RBC: 4.42 MIL/uL (ref 3.87–5.11)
RDW: 13.2 % (ref 11.5–15.5)
WBC: 7.1 10*3/uL (ref 4.0–10.5)
nRBC: 0 % (ref 0.0–0.2)

## 2021-02-26 LAB — CREATININE, SERUM
Creatinine, Ser: 0.65 mg/dL (ref 0.44–1.00)
GFR, Estimated: >60 mL/min (ref >60)

## 2021-02-26 LAB — SARS CORONAVIRUS 2 BY RT PCR (HOSPITAL ORDER, PERFORMED IN ~~LOC~~ HOSPITAL LAB): SARS Coronavirus 2: NEGATIVE

## 2021-02-26 SURGERY — INSERTION OF GASTROSTOMY TUBE
Anesthesia: General | Site: Abdomen

## 2021-02-26 MED ORDER — ONDANSETRON 4 MG PO TBDP: 4.0000 mg | ORAL_TABLET | Freq: Four times a day (QID) | ORAL | Status: DC | PRN

## 2021-02-26 MED ORDER — FENTANYL CITRATE PF 50 MCG/ML IJ SOSY: 25.0000 ug | PREFILLED_SYRINGE | INTRAMUSCULAR | Status: DC | PRN

## 2021-02-26 MED ORDER — EPHEDRINE 5 MG/ML INJ
INTRAVENOUS | Status: AC
  Filled 2021-02-26: qty 5

## 2021-02-26 MED ORDER — CHLORHEXIDINE GLUCONATE CLOTH 2 % EX PADS: 6.0000 | MEDICATED_PAD | Freq: Once | CUTANEOUS | Status: DC

## 2021-02-26 MED ORDER — FENTANYL CITRATE PF 50 MCG/ML IJ SOSY
PREFILLED_SYRINGE | INTRAMUSCULAR | Status: AC
  Administered 2021-02-26: 50 ug via INTRAVENOUS
  Filled 2021-02-26: qty 3

## 2021-02-26 MED ORDER — ROCURONIUM BROMIDE 10 MG/ML (PF) SYRINGE
PREFILLED_SYRINGE | INTRAVENOUS | Status: DC | PRN
  Administered 2021-02-26: 50 mg via INTRAVENOUS

## 2021-02-26 MED ORDER — ONDANSETRON HCL 4 MG/2ML IJ SOLN
INTRAMUSCULAR | Status: AC
  Administered 2021-02-26: 4 mg via INTRAVENOUS
  Filled 2021-02-26: qty 2

## 2021-02-26 MED ORDER — ONDANSETRON HCL 4 MG/2ML IJ SOLN
INTRAMUSCULAR | Status: AC
  Filled 2021-02-26: qty 2

## 2021-02-26 MED ORDER — PROPOFOL 10 MG/ML IV BOLUS
INTRAVENOUS | Status: DC | PRN
  Administered 2021-02-26: 150 mg via INTRAVENOUS

## 2021-02-26 MED ORDER — ORAL CARE MOUTH RINSE: 15.0000 mL | Freq: Once | OROMUCOSAL | Status: AC

## 2021-02-26 MED ORDER — TRAMADOL HCL 50 MG PO TABS
50.0000 mg | ORAL_TABLET | Freq: Four times a day (QID) | ORAL | Status: DC | PRN
  Administered 2021-02-27 (×2): 50 mg via ORAL
  Filled 2021-02-26 (×2): qty 1

## 2021-02-26 MED ORDER — HYDROMORPHONE HCL 1 MG/ML IJ SOLN
1.0000 mg | INTRAMUSCULAR | Status: DC | PRN
Start: 2021-02-26 — End: 2021-02-27
  Administered 2021-02-26: 1 mg via INTRAVENOUS
  Filled 2021-02-26: qty 1

## 2021-02-26 MED ORDER — LAMOTRIGINE 100 MG PO TABS
200.0000 mg | ORAL_TABLET | Freq: Every day | ORAL | Status: DC
  Administered 2021-02-26 – 2021-02-27 (×2): 200 mg via ORAL
  Filled 2021-02-26: qty 2

## 2021-02-26 MED ORDER — OXYCODONE HCL 5 MG PO TABS: 5.0000 mg | ORAL_TABLET | ORAL | Status: DC | PRN

## 2021-02-26 MED ORDER — LACTATED RINGERS IV SOLN: INTRAVENOUS | Status: DC

## 2021-02-26 MED ORDER — EPHEDRINE SULFATE-NACL 50-0.9 MG/10ML-% IV SOSY
PREFILLED_SYRINGE | INTRAVENOUS | Status: DC | PRN
  Administered 2021-02-26: 5 mg via INTRAVENOUS

## 2021-02-26 MED ORDER — ONDANSETRON HCL 4 MG/2ML IJ SOLN: 4.0000 mg | Freq: Four times a day (QID) | INTRAMUSCULAR | Status: DC | PRN

## 2021-02-26 MED ORDER — PROPOFOL 10 MG/ML IV BOLUS
INTRAVENOUS | Status: AC
  Filled 2021-02-26: qty 20

## 2021-02-26 MED ORDER — PHENYLEPHRINE 40 MCG/ML (10ML) SYRINGE FOR IV PUSH (FOR BLOOD PRESSURE SUPPORT)
PREFILLED_SYRINGE | INTRAVENOUS | Status: DC | PRN
  Administered 2021-02-26: 80 ug via INTRAVENOUS

## 2021-02-26 MED ORDER — FENTANYL CITRATE (PF) 100 MCG/2ML IJ SOLN
INTRAMUSCULAR | Status: DC | PRN
  Administered 2021-02-26: 50 ug via INTRAVENOUS

## 2021-02-26 MED ORDER — SODIUM CHLORIDE 0.45 % IV SOLN: INTRAVENOUS | Status: DC

## 2021-02-26 MED ORDER — BUPIVACAINE LIPOSOME 1.3 % IJ SUSP
INTRAMUSCULAR | Status: DC | PRN
  Administered 2021-02-26: 20 mL

## 2021-02-26 MED ORDER — FENTANYL CITRATE PF 50 MCG/ML IJ SOSY
25.0000 ug | PREFILLED_SYRINGE | INTRAMUSCULAR | Status: DC | PRN
  Administered 2021-02-26 (×2): 50 ug via INTRAVENOUS

## 2021-02-26 MED ORDER — ACETAMINOPHEN 325 MG PO TABS
650.0000 mg | ORAL_TABLET | Freq: Four times a day (QID) | ORAL | Status: DC | PRN
  Administered 2021-02-26 – 2021-02-27 (×2): 650 mg via ORAL
  Filled 2021-02-26 (×4): qty 2

## 2021-02-26 MED ORDER — PANTOPRAZOLE SODIUM 40 MG IV SOLR
40.0000 mg | Freq: Every day | INTRAVENOUS | Status: DC
  Administered 2021-02-26: 40 mg via INTRAVENOUS
  Filled 2021-02-26: qty 40

## 2021-02-26 MED ORDER — BUPIVACAINE LIPOSOME 1.3 % IJ SUSP
INTRAMUSCULAR | Status: AC
  Filled 2021-02-26: qty 20

## 2021-02-26 MED ORDER — OXCARBAZEPINE 150 MG PO TABS
150.0000 mg | ORAL_TABLET | Freq: Two times a day (BID) | ORAL | Status: DC
Start: 2021-02-26 — End: 2021-02-27
  Administered 2021-02-26 – 2021-02-27 (×2): 150 mg via ORAL
  Filled 2021-02-26 (×3): qty 1

## 2021-02-26 MED ORDER — SUGAMMADEX SODIUM 200 MG/2ML IV SOLN
INTRAVENOUS | Status: DC | PRN
  Administered 2021-02-26: 200 mg via INTRAVENOUS

## 2021-02-26 MED ORDER — LIDOCAINE HCL (PF) 2 % IJ SOLN
INTRAMUSCULAR | Status: AC
  Filled 2021-02-26: qty 5

## 2021-02-26 MED ORDER — ONDANSETRON HCL 4 MG/2ML IJ SOLN
INTRAMUSCULAR | Status: DC | PRN
  Administered 2021-02-26: 4 mg via INTRAVENOUS

## 2021-02-26 MED ORDER — ACETAMINOPHEN 650 MG RE SUPP: 650.0000 mg | Freq: Four times a day (QID) | RECTAL | Status: DC | PRN

## 2021-02-26 MED ORDER — FENTANYL CITRATE PF 50 MCG/ML IJ SOSY
PREFILLED_SYRINGE | INTRAMUSCULAR | Status: AC
  Administered 2021-02-26: 50 ug via INTRAVENOUS
  Filled 2021-02-26: qty 1

## 2021-02-26 MED ORDER — BENAZEPRIL HCL 10 MG PO TABS
20.0000 mg | ORAL_TABLET | Freq: Two times a day (BID) | ORAL | Status: DC
  Administered 2021-02-27: 20 mg via ORAL
  Filled 2021-02-26: qty 2

## 2021-02-26 MED ORDER — FUROSEMIDE 40 MG PO TABS
40.0000 mg | ORAL_TABLET | Freq: Every day | ORAL | Status: DC
  Administered 2021-02-27: 40 mg via ORAL
  Filled 2021-02-26: qty 1

## 2021-02-26 MED ORDER — FENTANYL CITRATE (PF) 100 MCG/2ML IJ SOLN
INTRAMUSCULAR | Status: AC
  Filled 2021-02-26: qty 2

## 2021-02-26 MED ORDER — PHENYLEPHRINE 40 MCG/ML (10ML) SYRINGE FOR IV PUSH (FOR BLOOD PRESSURE SUPPORT)
PREFILLED_SYRINGE | INTRAVENOUS | Status: AC
  Filled 2021-02-26: qty 10

## 2021-02-26 MED ORDER — ONDANSETRON HCL 4 MG/2ML IJ SOLN: 4.0000 mg | Freq: Once | INTRAMUSCULAR | Status: AC

## 2021-02-26 MED ORDER — DEXAMETHASONE SODIUM PHOSPHATE 10 MG/ML IJ SOLN
INTRAMUSCULAR | Status: AC
  Filled 2021-02-26: qty 1

## 2021-02-26 MED ORDER — ROCURONIUM BROMIDE 10 MG/ML (PF) SYRINGE
PREFILLED_SYRINGE | INTRAVENOUS | Status: AC
  Filled 2021-02-26: qty 10

## 2021-02-26 MED ORDER — 0.9 % SODIUM CHLORIDE (POUR BTL) OPTIME
TOPICAL | Status: DC | PRN
  Administered 2021-02-26: 1000 mL

## 2021-02-26 MED ORDER — DEXAMETHASONE SODIUM PHOSPHATE 10 MG/ML IJ SOLN
INTRAMUSCULAR | Status: DC | PRN
  Administered 2021-02-26: 8 mg via INTRAVENOUS

## 2021-02-26 MED ORDER — INFLUENZA VAC A&B SA ADJ QUAD 0.5 ML IM PRSY
0.5000 mL | PREFILLED_SYRINGE | INTRAMUSCULAR | Status: DC | PRN
  Filled 2021-02-26: qty 0.5

## 2021-02-26 MED ORDER — CHLORHEXIDINE GLUCONATE 0.12 % MT SOLN
15.0000 mL | Freq: Once | OROMUCOSAL | Status: AC
  Administered 2021-02-26: 15 mL via OROMUCOSAL

## 2021-02-26 MED ORDER — LIDOCAINE 2% (20 MG/ML) 5 ML SYRINGE
INTRAMUSCULAR | Status: DC | PRN
  Administered 2021-02-26: 60 mg via INTRAVENOUS
  Administered 2021-02-26: 40 mg via INTRAVENOUS

## 2021-02-26 MED ORDER — CARVEDILOL 25 MG PO TABS
25.0000 mg | ORAL_TABLET | Freq: Two times a day (BID) | ORAL | Status: DC
  Administered 2021-02-26 – 2021-02-27 (×2): 25 mg via ORAL
  Filled 2021-02-26 (×2): qty 1

## 2021-02-26 MED ORDER — ENOXAPARIN SODIUM 40 MG/0.4ML IJ SOSY
40.0000 mg | PREFILLED_SYRINGE | INTRAMUSCULAR | Status: DC
  Administered 2021-02-27: 40 mg via SUBCUTANEOUS
  Filled 2021-02-26: qty 0.4

## 2021-02-26 SURGICAL SUPPLY — 37 items
ADH SKN CLS APL DERMABOND .7 (GAUZE/BANDAGES/DRESSINGS) ×1
APL PRP STRL LF DISP 70% ISPRP (MISCELLANEOUS) ×1
BAG COUNTER SPONGE SURGICOUNT (BAG) IMPLANT
BAG DRN RND TRDRP ANRFLXCHMBR (UROLOGICAL SUPPLIES) ×1
BAG SPNG CNTER NS LX DISP (BAG)
BAG URINE DRAIN 2000ML AR STRL (UROLOGICAL SUPPLIES) ×2 IMPLANT
CATH GASTROSTOMY 20FR (CATHETERS) ×1 IMPLANT
CHLORAPREP W/TINT 26 (MISCELLANEOUS) ×2 IMPLANT
DERMABOND ADVANCED (GAUZE/BANDAGES/DRESSINGS) ×1
DERMABOND ADVANCED .7 DNX12 (GAUZE/BANDAGES/DRESSINGS) IMPLANT
DRAPE LAPAROSCOPIC ABDOMINAL (DRAPES) ×2 IMPLANT
DRAPE WARM FLUID 44X44 (DRAPES) ×2 IMPLANT
GAUZE SPONGE 4X4 12PLY STRL (GAUZE/BANDAGES/DRESSINGS) IMPLANT
GLOVE SURG ENC MOIS LTX SZ6.5 (GLOVE) IMPLANT
GLOVE SURG ENC TEXT LTX SZ7.5 (GLOVE) IMPLANT
GLOVE SURG UNDER POLY LF SZ7 (GLOVE) ×2 IMPLANT
GOWN STRL REUS W/TWL XL LVL3 (GOWN DISPOSABLE) ×4 IMPLANT
HANDLE SUCTION POOLE (INSTRUMENTS) IMPLANT
KIT BASIN OR (CUSTOM PROCEDURE TRAY) ×2 IMPLANT
KIT TURNOVER KIT A (KITS) ×2 IMPLANT
PACK GENERAL/GYN (CUSTOM PROCEDURE TRAY) ×2 IMPLANT
SPONGE DRAIN TRACH 4X4 STRL 2S (GAUZE/BANDAGES/DRESSINGS) ×2 IMPLANT
SPONGE T-LAP 18X18 ~~LOC~~+RFID (SPONGE) IMPLANT
SUCTION POOLE HANDLE (INSTRUMENTS)
SUT ETHILON 2 0 PS N (SUTURE) ×2 IMPLANT
SUT ETHILON 3 0 PS 1 (SUTURE) ×2 IMPLANT
SUT MNCRL AB 4-0 PS2 18 (SUTURE) ×1 IMPLANT
SUT PDS AB 1 CT1 27 (SUTURE) ×1 IMPLANT
SUT PROLENE 0 SH 30 (SUTURE) ×4 IMPLANT
SUT PROLENE 2 0 BLUE (SUTURE) IMPLANT
SUT VIC AB 0 UR5 27 (SUTURE) IMPLANT
SUT VIC AB 4-0 P-3 18XBRD (SUTURE) ×1 IMPLANT
SUT VIC AB 4-0 P3 18 (SUTURE) ×2
SYR 20ML LL LF (SYRINGE) ×2 IMPLANT
TAPE PAPER 3X10 WHT MICROPORE (GAUZE/BANDAGES/DRESSINGS) ×1 IMPLANT
TOWEL OR 17X26 10 PK STRL BLUE (TOWEL DISPOSABLE) ×2 IMPLANT
TOWEL OR NON WOVEN STRL DISP B (DISPOSABLE) ×2 IMPLANT

## 2021-02-26 NOTE — Progress Notes (Signed)
Initial Nutrition Assessment  DOCUMENTATION CODES:  Severe malnutrition in context of chronic illness  INTERVENTION:  Initiate tube feeding via PEG 24 hrs post-op: Osmolite 1.2 at 20 ml/h and increase by 10 ml every 6 hrs to goal rate of 60 ml/h (1440 ml per day)  100 ml free water q 4 hours.  Provides 1728 kcal, 80 gm protein, 1781 ml free water daily.  Monitor magnesium, potassium, and phosphorus daily for at least 3 days, MD to replete as needed, as pt is at risk for refeeding syndrome.   NUTRITION DIAGNOSIS:  Severe Malnutrition related to chronic illness, cancer and cancer related treatments as evidenced by severe fat depletion, severe muscle depletion, energy intake < or equal to 75% for > or equal to 1 month.  GOAL:  Patient will meet greater than or equal to 90% of their needs  MONITOR:  TF tolerance, Weight trends, Labs, Skin, I & O's  REASON FOR ASSESSMENT:  Consult Enteral/tube feeding initiation and management  ASSESSMENT:  80 yo female with a PMH of HTN who presents with squamous cell carcinoma of esophagus. Admitted for PEG placement on 02/26/21. 10/7 - s/p PEG tube placement  Spoke with husband and pt at bedside. Pt in good spirits. Pt reports feeling okay after her surgery.   Spoke with her regarding RN and [place] teaching about tube feeding.   Weekend RD to start continuous feeds tomorrow 24 hrs post surgery - communicated this to weekend RD.  After determining for refeeding risk and toleration of tube feeding, pt to transition to bolus feeds at home.  [place] RD TF Regimen: 5 cartons Osmolite 1.5 split over 4 feedings/day.  Flush tube with 60 ml water before and after each feeding.  Drink by mouth or give via tube additional 2 c fluids/day.  This will provide 1775 kcal, 74.5 grams protein, and 1865 ml total water. Meets 100% of needs.   Given that pharmacy is out of Osmolite 1.5, what was planned for pt by outpatient cancer RD, pt to trial  Osmolite 1.2 in hospital.  Pt's weight has been stable for the past 2 months.  Medications: reviewed; Lasix, Protonix, NaCl @ 50 ml/hr  Labs: reviewed  NUTRITION - FOCUSED PHYSICAL EXAM: Flowsheet Row Most Recent Value  Orbital Region Moderate depletion  Upper Arm Region Severe depletion  Thoracic and Lumbar Region Unable to assess  Buccal Region Severe depletion  Temple Region Moderate depletion  Clavicle Bone Region Severe depletion  Clavicle and Acromion Bone Region Severe depletion  Scapular Bone Region Unable to assess  Dorsal Hand Moderate depletion  Patellar Region Moderate depletion  Anterior Thigh Region Moderate depletion  Posterior Calf Region Moderate depletion  Edema (RD Assessment) None  Hair Reviewed  Eyes Reviewed  Mouth Reviewed  Skin Reviewed  Nails Reviewed   Diet Order:   Diet Order             Diet clear liquid Room service appropriate? Yes; Fluid consistency: Thin  Diet effective now                  EDUCATION NEEDS:  Education needs have been addressed  Skin:  Skin Assessment: Skin Integrity Issues: Skin Integrity Issues:: Incisions Incisions: Abdomen, closed (PEG placed) and Lip, closed  Last BM:  PTA  Height:  Ht Readings from Last 1 Encounters:  02/26/21 5' 6"  (1.676 m)   Weight:  Wt Readings from Last 1 Encounters:  02/26/21 53.5 kg   BMI:  Body mass  index is 19.04 kg/m.  Estimated Nutritional Needs:  Kcal:  1700-1900 Protein:  70-85 grams Fluid:  >1.7 L  Derrel Nip, RD, LDN (she/her/hers) Registered Dietitian I After-Hours/Weekend Pager # in [place]

## 2021-02-26 NOTE — Anesthesia Preprocedure Evaluation (Signed)
Anesthesia Evaluation  Patient identified by MRN, date of birth, ID band Patient awake    Reviewed: Allergy & Precautions, NPO status , Patient's Chart, lab work & pertinent test results  Airway Mallampati: II  TM Distance: >3 FB     Dental   Pulmonary    breath sounds clear to auscultation       Cardiovascular hypertension,  Rhythm:Regular Rate:Normal     Neuro/Psych  Headaches,  Neuromuscular disease    GI/Hepatic Neg liver ROS, GERD  ,  Endo/Other    Renal/GU Renal disease     Musculoskeletal  (+) Arthritis ,   Abdominal   Peds  Hematology   Anesthesia Other Findings   Reproductive/Obstetrics                             Anesthesia Physical Anesthesia Plan  ASA: 3  Anesthesia Plan: General   Post-op Pain Management:    Induction: Intravenous  PONV Risk Score and Plan: Ondansetron and Dexamethasone  Airway Management Planned: Oral ETT  Additional Equipment:   Intra-op Plan:   Post-operative Plan: Extubation in OR  Informed Consent: I have reviewed the patients History and Physical, chart, labs and discussed the procedure including the risks, benefits and alternatives for the proposed anesthesia with the patient or authorized representative who has indicated his/her understanding and acceptance.     Dental advisory given  Plan Discussed with: CRNA and Anesthesiologist  Anesthesia Plan Comments:         Anesthesia Quick Evaluation

## 2021-02-26 NOTE — Discharge Instructions (Addendum)
[facility]  OPEN ABDOMINAL SURGERY: POST OP INSTRUCTIONS  Always review your discharge instruction sheet given to you by the facility where your surgery was performed.  A prescription for pain medication may be given to you upon discharge.  Take your pain medication as prescribed.  If narcotic pain medicine is not needed, then you may take acetaminophen (Tylenol) or ibuprofen (Advil) as needed. Take your usually prescribed medications unless otherwise directed. If you need a refill on your pain medication, please contact your pharmacy. They will contact our office to request authorization.  Prescriptions will not be filled after 5 pm or on weekends. You should follow a light diet the first few days after arrival home, such as soup and crackers, unless your doctor has advised otherwise. A high-fiber, low fat diet can be resumed as tolerated.  Be sure to include plenty of fluids daily.  Most patients will experience some swelling and bruising in the area of the incision. Ice packs will help. Swelling and bruising can take several days to resolve. It is common to experience some constipation if taking pain medication after surgery.  Increasing fluid intake and taking a stool softener will usually help or prevent this problem from occurring.  A mild laxative (Milk of Magnesia or Miralax) should be taken according to package directions if there are no bowel movements after 48 hours.  You may have steri-strips (small skin tapes) in place directly over the incision.  These strips should be left on the skin for 5-7 days.  Any sutures or staples will be removed at the office during your follow-up visit. You may find that a light gauze bandage over your incision may keep your staples from being rubbed or pulled. You may shower and replace the bandage daily. ACTIVITIES:  You may resume regular (light) daily activities beginning the next day - such as daily self-care, walking, climbing stairs -  gradually increasing activities as tolerated.  You may have sexual intercourse when it is comfortable.  Refrain from any heavy lifting or straining until approved by your doctor.  You may drive when you no longer are taking prescription pain medication, you can comfortably wear a seatbelt, and you can safely maneuver your car and apply brakes. You should see your doctor in the office for a follow-up appointment approximately 2-3 weeks after your surgery.  Make sure that you call for this appointment within a day or two after you arrive home to insure a convenient appointment time.  WHEN TO CALL YOUR DOCTOR: Fever greater than 101.0 Inability to urinate Persistent nausea and/or vomiting Extreme swelling or bruising Continued bleeding from incision Increased pain, redness, or drainage from the incision Difficulty swallowing or breathing Muscle cramping or spasms Numbness or tingling in hands or around lips  IF YOU HAVE DISABILITY OR FAMILY LEAVE FORMS, YOU MUST BRING THEM TO THE OFFICE FOR PROCESSING.  PLEASE DO NOT GIVE THEM TO YOUR DOCTOR.  The clinic staff is available to answer your questions during regular business hours.  Please don't hesitate to call and ask to speak to one of the nurses if you have concerns.  [facility] [place] Office: [phone]  For further questions, please visit [email or web address]

## 2021-02-26 NOTE — H&P (Signed)
PROVIDER: Arrionna Serena Charlotta Newton, MD  Chief Complaint: New Consultation (Esophageal carcinoma - gastrostomy placement)   History of Present Illness:  Patient is referred by Dr. [name] for surgical evaluation for gastrostomy tube placement. 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 Gastroenterology and interventional radiology have been unable to access the stomach due to the size of the tumor. Patient has had weight loss and dysphagia. Surgical gastrostomy tube placement is desired for nutritional support during planned treatment of her squamous cell carcinoma of the esophagus with radiation and chemotherapy.  Review of Systems: A complete review of systems was obtained from the patient. I have reviewed this information and discussed as appropriate with the patient. See HPI as well for other ROS.  Review of Systems  Constitutional: Positive for malaise/fatigue and weight loss.  HENT: Positive for sore throat.  Eyes: Negative.  Respiratory: Negative.  Cardiovascular: Positive for leg swelling.  Gastrointestinal: Negative.  Dysphagia  Genitourinary: Negative.  Musculoskeletal: Negative.  Skin: Negative.  Neurological: Positive for headaches.  Endo/Heme/Allergies: Negative.  Psychiatric/Behavioral: Negative.    Medical History: Past Medical History:  Diagnosis Date   Hypertension   Patient Active Problem List  Diagnosis   Squamous cell carcinoma of esophagus (CMS-HCC)   Past Surgical History:  Procedure Laterality Date   back surgery   fibroid tumor surgery   gallbladder surgery   shoulder surgery    Allergies  Allergen Reactions   Hydrocodone Other (See Comments)  Rebound headaches   Oxycodone Unknown   Sulfa (Sulfonamide Antibiotics) Unknown   Sulfamethoxazole-Trimethoprim Hives and Itching  REACTION: unspecified REACTION: unspecified   Sulfur Itching and Hives   Current Outpatient Medications on File Prior to Visit  Medication  Sig Dispense Refill   alendronate (FOSAMAX) 70 MG tablet TAKE 1 TABLET BY MOUTH 1 TIME A WEEK WITH FULL GLASS OF WATER AND ON AN EMPTY STOMACH AS DIRECTED   benazepriL (LOTENSIN) 20 MG tablet Take 20 mg by mouth 2 (two) times daily   carvediloL (COREG) 25 MG tablet TAKE 1 TABLET(25 MG) BY MOUTH TWICE DAILY WITH A MEAL   fluticasone propionate (FLONASE) 50 mcg/actuation nasal spray Place into one nostril   FUROsemide (LASIX) 40 MG tablet Take 40 mg by mouth once daily   lamoTRIgine (LAMICTAL) 100 MG tablet Take 2 pills in the morning(231m) and evening. May take an additional 1/2 pill(544m if needed with the morning and evening doses   omeprazole (PRILOSEC) 20 MG DR capsule Take by mouth at bedtime as needed   OXcarbazepine (TRILEPTAL) 300 MG tablet Take by mouth   prochlorperazine (COMPAZINE) 10 MG tablet Take by mouth   acetaminophen (TYLENOL) 325 MG tablet Take by mouth   calcium carbonate-vitamin D3 600 mg-20 mcg (800 unit) Tab Take 1 tablet by mouth 2 (two) times daily   conjugated estrogens (PREMARIN) 0.625 mg/gram vaginal cream Place vaginally   multivitamin with iron (CENTRUM ADULT COMPLETE) tablet Take 1 tablet by mouth once daily   ondansetron (ZOFRAN) 8 MG tablet   peg 400-propylene glycol 0.4-0.3 % DrpG Apply to eye   No current facility-administered medications on file prior to visit.   Family History  Problem Relation Age of Onset   High blood pressure (Hypertension) Father    Social History   Tobacco Use  Smoking Status Never Smoker  Smokeless Tobacco Never Used    Social History   Socioeconomic History   Marital status: Married  Tobacco Use   Smoking status: Never Smoker  Smokeless tobacco: Never Used  Vaping Use   Vaping Use: Never used  Substance and Sexual Activity   Alcohol use: Yes   Drug use: Never   Objective:   Vitals:  02/24/21 1205  BP: (!) 162/84  Pulse: 70  Temp: 36.9 C (98.4 F)  SpO2: 96%  Weight: 54.7 kg (120 lb 9.6 oz)  Height:  167.6 cm (5' 6" )   Body mass index is 19.47 kg/m.  Physical Exam   GENERAL APPEARANCE Development: normal Nutritional status: normal Gross deformities: none  SKIN Rash, lesions, ulcers: none Induration, erythema: none Nodules: none palpable  EYES Conjunctiva and lids: normal Pupils: equal and reactive Iris: normal bilaterally  EARS, NOSE, MOUTH, THROAT External ears: no lesion or deformity External nose: no lesion or deformity Hearing: grossly normal Due to Covid-19 pandemic, patient is wearing a mask.  NECK Symmetric: yes Trachea: midline Thyroid: no palpable nodules in the thyroid bed  CHEST Respiratory effort: normal Retraction or accessory muscle use: no Breath sounds: normal bilaterally Rales, rhonchi, wheeze: none  CARDIOVASCULAR Auscultation: regular rhythm, normal rate Murmurs: none Pulses: radial pulse 2+ palpable Lower extremity edema: Moderate bilateral  ABDOMEN Distension: none Masses: none palpable Tenderness: none Hepatosplenomegaly: not present Hernia: not present  MUSCULOSKELETAL Station and gait: normal Digits and nails: no clubbing or cyanosis Muscle strength: grossly normal all extremities Range of motion: grossly normal all extremities Deformity: none  LYMPHATIC Cervical: none palpable Supraclavicular: none palpable  PSYCHIATRIC Oriented to person, place, and time: yes Mood and affect: normal for situation Judgment and insight: appropriate for situation  Assessment and Plan:  Diagnoses and all orders for this visit:  Squamous cell carcinoma of esophagus (CMS-HCC)   Patient is referred by Dr. [name] for surgical evaluation for gastrostomy tube placement. 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 Gastroenterology and interventional radiology have been unable to access the stomach due to the size of the tumor. Patient has had weight loss and dysphagia. Surgical gastrostomy tube placement is desired for  nutritional support during planned treatment of her squamous cell carcinoma of the esophagus with radiation and chemotherapy.  Patient is well-known to my practice. I performed her laparoscopic cholecystectomy. She has also had previous gynecologic procedures.  Today we discussed open gastrostomy tube placement. I explained the procedure, the location of the surgical incision, and the placement of the gastrostomy tube. She is scheduled for Friday, October 7, at [facility]. We will plan on an overnight hospital stay.  The risks and benefits of the procedure have been discussed at length with the patient. The patient understands the proposed procedure, potential alternative treatments, and the course of recovery to be expected. All of the patient's questions have been answered at this time. The patient wishes to proceed with surgery.  Armandina Gemma, MD [facility] [place] practice Office: [phone]

## 2021-02-26 NOTE — Chronic Care Management (AMB) (Signed)
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 No medication changes.  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 No medication changes.  02/24/2021 [name] (General Surg) - Patient presented for Squamous cell carcinoma of esophagus.  02/18/2021 [name], NP (Oncology) - Patient presented for Malignant neoplasm of upper third esophagus. No medication changes.  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 No medication changes.  02/08/2021 [name], MD (Otolaryngology) - Patient presented for Pharyngoesophageal dysphagia and other concerns. No medication changes,  02/03/2021 [name], [name], PA-C (Gastroenterology) - Patient presented for Dysphagia and other concerns. [facility] visits:  Medication Reconciliation was completed by comparing discharge summary, patient's EMR and Pharmacy list, and upon discussion with patient.  Patient presented to [facility] on 02-26-2021 for Malignant neoplasm of upper third esophagus. She was there for 33 hours  New?Medications Started at [facility] Discharge:?? -started Ultram 50 mg  Medication Changes at Hospital Discharge: -Changed  Timing of Zofran  Medications Discontinued at Hospital Discharge: -Stopped  None  Medications that remain the same after Hospital Discharge:??  -All other medications will remain the same.     Medication Reconciliation was completed by  comparing discharge summary, patient's EMR and Pharmacy list, and upon discussion with patient.  Patient presented to [facility] on 02-12-2021 for Direct Laryngoscopy and Biopsy Possible Frozen. Patient was there for 4 hours.  New?Medications Started at [facility] Discharge:?? -started None  Medication Changes at Hospital Discharge: -Changed None  Medications Discontinued at Hospital Discharge: -Stopped None  Medications that remain the same after Hospital Discharge:??  -All other medications will remain the same.   Medications: Facility-Administered Encounter Medications as of 02/26/2021  Medication   0.9 % irrigation (POUR BTL)   bupivacaine liposome (EXPAREL) 1.3 % injection   Chlorhexidine Gluconate Cloth 2 % PADS 6 each   And   Chlorhexidine Gluconate Cloth 2 % PADS 6 each   fentaNYL (SUBLIMAZE) injection 25-50 mcg   lactated ringers infusion   Outpatient Encounter Medications as of 02/26/2021  Medication Sig   acetaminophen (TYLENOL) 325 MG tablet Take 650 mg by mouth every 6 (six) hours as needed for moderate pain.   alendronate (FOSAMAX) 70 MG tablet TAKE 1 TABLET BY MOUTH 1 TIME A WEEK WITH FULL GLASS OF WATER AND ON AN EMPTY STOMACH AS DIRECTED (Patient not taking: No sig reported)   benazepril (LOTENSIN) 20 MG tablet Take 1 tablet (20 mg total) by mouth 2 (two) times daily.   Calcium Carb-Cholecalciferol (CALCIUM 600+D) 600-800 MG-UNIT TABS Take 1 tablet by mouth 2 (two) times daily.   carvedilol (COREG) 25 MG tablet TAKE 1 TABLET(25 MG) BY MOUTH TWICE DAILY WITH A MEAL   conjugated estrogens (PREMARIN) vaginal cream Place 1 applicator vaginally 2 (two) times a week.   fluticasone (FLONASE) 50 MCG/ACT nasal spray Place 2 sprays into both nostrils as needed for allergies or rhinitis.   furosemide (LASIX) 40 MG tablet Take 1 tablet (40 mg total) by mouth daily.   lamoTRIgine (LAMICTAL)  100 MG tablet Take 2 pills in the morning(283m) and evening. May take an  additional 1/2 pill(543m if needed with the morning and evening doses (Patient taking differently: Takes 100 mg in the morning and 150 mg in the evening.)   Multiple Vitamin (MULTIVITAMIN ADULT PO) Take 1 tablet by mouth daily.   omeprazole (PRILOSEC) 20 MG capsule Take 1 capsule (20 mg total) by mouth daily. (Patient taking differently: Take 20 mg by mouth daily as needed (acid reflux).)   ondansetron (ZOFRAN) 8 MG tablet Take 1 tablet (8 mg total) by mouth 2 (two) times daily as needed for refractory nausea / vomiting. Start on day 3 after chemo. (Patient not taking: No sig reported)   ondansetron (ZOFRAN-ODT) 4 MG disintegrating tablet Take 1-2 tablets (4-8 mg total) by mouth every 8 (eight) hours as needed for nausea. (Patient not taking: No sig reported)   Oxcarbazepine (TRILEPTAL) 300 MG tablet Take 0.5 tablets (150 mg total) by mouth 2 (two) times daily.   Polyethyl Glycol-Propyl Glycol (SYSTANE) 0.4-0.3 % GEL ophthalmic gel Place 1 application into both eyes 2 (two) times daily as needed (dry eyes).   prochlorperazine (COMPAZINE) 10 MG tablet Take 1 tablet (10 mg total) by mouth every 6 (six) hours as needed (Nausea or vomiting). (Patient not taking: No sig reported)  Reviewed chart prior to disease state call. Spoke with patient regarding BP  Recent Office Vitals: BP Readings from Last 3 Encounters:  02/26/21 (!) 159/84  02/25/21 (!) 136/107  02/22/21 (!) 168/93   Pulse Readings from Last 3 Encounters:  02/26/21 (!) 52  02/25/21 61  02/22/21 66    Wt Readings from Last 3 Encounters:  02/26/21 117 lb 15.1 oz (53.5 kg)  02/25/21 118 lb (53.5 kg)  02/22/21 120 lb (54.4 kg)     Kidney Function Lab Results  Component Value Date/Time   CREATININE 0.85 02/18/2021 01:15 PM   CREATININE 0.92 02/04/2021 03:31 PM   CREATININE 1.11 (H) 11/26/2020 11:05 AM   GFR 58.37 (L) 02/04/2021 03:31 PM   GFRNONAA >60 02/18/2021 01:15 PM   GFRAA 62 07/08/2020 04:53 PM    BMP Latest Ref Rng &  Units 02/18/2021 02/04/2021 11/26/2020  Glucose 70 - 99 mg/dL 87 84 100(H)  BUN 8 - 23 mg/dL 26(H) 15 20  Creatinine 0.44 - 1.00 mg/dL 0.85 0.92 1.11(H)  BUN/Creat Ratio 12 - 28 - - 18  Sodium 135 - 145 mmol/L 141 141 142  Potassium 3.5 - 5.1 mmol/L 4.2 3.5 4.5  Chloride 98 - 111 mmol/L 103 100 97  CO2 22 - 32 mmol/L 28 36(H) 30(H)  Calcium 8.9 - 10.3 mg/dL 10.2 10.4 10.5(H)    Current antihypertensive regimen:  Benazepril 20 mg 1 tablet twice daily Carvedilol 25 mg 1 tablet twice daily Furosemide 40 mg 1 tablet daily  How often are you checking your Blood Pressure? Patient reports she did not start checking yet, no dizziness or lightheadedness. Current home BP readings: BP Readings from Last 3 Encounters:  02/26/21 (!) 159/84  02/25/21 (!) 136/107  02/22/21 (!) 168/93   What recent interventions/DTPs have been made by any provider to improve Blood Pressure control since last CPP Visit: Patient reports none of her medications have been changed recently. Any recent hospitalizations or ED visits since last visit with CPP? Yes What diet changes have been made to improve Blood Pressure Control?  Patient reports that she has a tumor in her throat undergoing care of oncology and being treated, has recently  had a feeding tube procedure is tolerating her meals that way. Is currently unable to swallow, inquired if any of her medications including tylenol can be switched from pill to liquid form. Forwarded to [name] for assistance and advised I would get back with her once I had an answer, she was ok with that. What exercise is being done to improve your Blood Pressure Control?  Patient recently had feeding tube procedure that she is recovering from.  Adherence Review: Is the patient currently on ACE/ARB medication? Yes Does the patient have >5 day gap between last estimated fill dates? No  Notes: Per [name] call to Dr Ferdinand Lango office left message for nurse to request liquid  versions of Lamictal and Trileptal be sent to patients pharmacy as she is now on feeding tube and  Liquid versions of omeprazole and furosemide have been requested from Dr Regis Bill and a replacement for benazepril as it does not have a liquid version. Call to patient advised of the above and that the cost may be greater from her pharmacy, spoke to husband and he stated that was ok. He also advised me that she had sutures placed at the site of her tube that do not appear to be dissolvable and they were not given an appointment for removal he reported the procedure was done about a week ago. Advised him sutures can not be removed before 14 days so she is not yet due however I would reach out to the office and have some one call to get them on the schedule.   03/18/21 Followed up with patients husband he reports they had seen surgeon recently whom advised the sutures should stay in a bit longer and they will address at her next follow up an appointment for removal. He also reports patient is not at the moment using her omeprazole or furosemide. He reports her benazepril is now once a day and not twice would like a liquid for it if possible. Informed Clinical Pharmacist  Care Gaps: BP - 160/86 COVID Booster #3 [employer]) - Overdue Zoster Vaccines - Overdue AWV 03-2022 CCM 07-2021  Star Rating Drugs: Benazepril (Lotensin) 20 mg - Last filled 12-14-2020 90 DS at  [place] Pharmacist Assistant [phone]

## 2021-02-26 NOTE — Anesthesia Procedure Notes (Signed)
Procedure Name: Intubation Date/Time: 02/26/2021 9:49 AM Performed by: Victoriano Lain, CRNA Pre-anesthesia Checklist: Patient identified, Emergency Drugs available, Suction available, Patient being monitored and Timeout performed Patient Re-evaluated:Patient Re-evaluated prior to induction Oxygen Delivery Method: Circle system utilized Preoxygenation: Pre-oxygenation with 100% oxygen Induction Type: IV induction Ventilation: Mask ventilation without difficulty Laryngoscope Size: Mac and 4 Grade View: Grade I Tube type: Oral Tube size: 7.0 mm Number of attempts: 1 Airway Equipment and Method: Stylet Placement Confirmation: ETT inserted through vocal cords under direct vision, positive ETCO2 and breath sounds checked- equal and bilateral Secured at: 21 cm Tube secured with: Tape Dental Injury: Teeth and Oropharynx as per pre-operative assessment

## 2021-02-26 NOTE — Transfer of Care (Signed)
Immediate Anesthesia Transfer of Care Note  Patient: [name]  Procedure(s) Performed: OPEN GASTROSTOMY TUBE PLACEMENT (Abdomen)  Patient Location: PACU  Anesthesia Type:General  Level of Consciousness: awake, alert , oriented and patient cooperative  Airway & Oxygen Therapy: Patient Spontanous Breathing and Patient connected to face mask oxygen  Post-op Assessment: Report given to RN, Post -op Vital signs reviewed and stable and Patient moving all extremities  Post vital signs: Reviewed and stable  Last Vitals:  Vitals Value Taken Time  BP    Temp    Pulse 58 02/26/21 1102  Resp 13 02/26/21 1102  SpO2 100 % 02/26/21 1102  Vitals shown include unvalidated device data.  Last Pain:  Vitals:   02/26/21 0735  TempSrc: Oral  PainSc:          Complications: No notable events documented.

## 2021-02-26 NOTE — Op Note (Signed)
Operative Note  Pre-operative Diagnosis:  esophageal carcinoma  Post-operative Diagnosis:  same  Surgeon:  Armandina Gemma, MD  Assistant:  none   Procedure:  open gastrostomy tube placement  Anesthesia:  general  Estimated Blood Loss:  minimal  Drains: 20 Fr gastrostomy tube placement         Specimen: none  Indications:  Patient is referred by Dr. [name] for surgical evaluation for gastrostomy tube placement. 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 Gastroenterology and interventional radiology have been unable to access the stomach due to the size of the tumor. Patient has had weight loss and dysphagia. Surgical gastrostomy tube placement is desired for nutritional support during planned treatment of her squamous cell carcinoma of the esophagus with radiation and chemotherapy.  Procedure:  The patient was seen in the pre-op holding area. The risks, benefits, complications, treatment options, and expected outcomes were previously discussed with the patient. The patient agreed with the proposed plan and has signed the informed consent form.  The patient was brought to the operating room by the surgical team, identified as [name] and the procedure verified. A "time out" was completed and the above information confirmed.  Following administration of general anesthesia, the patient was positioned and then prepped and draped in the usual aseptic fashion.  After ascertaining that an adequate level of anesthesia been achieved, a upper midline abdominal incision is made with a #15 blade.  Dissection was carried down to the fascia and hemostasis achieved with the electrocautery.  Fascia is incised in the midline.  Peritoneal cavity is entered cautiously.  The gastric antrum is just beneath the incision.  Pylorus is identified.  Stomach is easily mobilized.  A point in the distal body of the stomach is marked.  2 concentric pursestring sutures are placed with 0  Prolene.  An incision is made in the left upper quadrant of the abdominal wall and extended down to the fascia.  Using a tonsil hemostat the peritoneal cavity is entered.  A 20 French gastrostomy tube is inserted through the tract and into the peritoneal cavity.  Gastrotomy was made with the electrocautery in the middle of the concentric pursestring sutures.  Gastrostomy tube is introduced into the gastric lumen and the balloon is inflated.  Pursestring sutures were then tied securely.  Using 0 Prolene interrupted sutures, 4 sutures are placed around the gastrostomy tube in order to packs the anterior wall of the stomach to the anterior abdominal wall.  Gastrostomy tube is pulled up until the stomach is in contact with the peritoneum and all 4 sutures are then tied securely.  Gastrostomy tube irrigates with saline easily and aspirates bilious gastric contents.  Tube was flushed with saline and capped.  The rubber disc of the gastrostomy tube was then secured to the abdominal wall with interrupted 3-0 nylon sutures.  The skin at the site of gastrostomy tube insertion is also closed cephalad and caudad with interrupted 3-0 nylon sutures.  Good hemostasis is noted.  Midline fascia is closed with a running #1 PDS suture.  Subcutaneous tissues are irrigated.  Local anesthetic with Exparel is injected throughout the operative field.  Skin edges are reapproximated with a running 4-0 Monocryl subcuticular suture.  Wounds are washed and dried and Dermabond is applied to the midline incision.  Dry gauze dressing was placed around the gastrostomy tube.  Patient is awakened from anesthesia and transported to the recovery room in stable condition.  The patient tolerated the  procedure well.   Armandina Gemma, [place] [facility] Office: [phone]

## 2021-02-26 NOTE — Interval H&P Note (Signed)
History and Physical Interval Note:  02/26/2021 9:25 AM  [name]  has presented today for surgery, with the diagnosis of ESOPHOGEAL CANCER.  The various methods of treatment have been discussed with the patient and family. After consideration of risks, benefits and other options for treatment, the patient has consented to    Procedure(s): OPEN GASTROSTOMY TUBE PLACEMENT (N/A) as a surgical intervention.    The patient's history has been reviewed, patient examined, no change in status, stable for surgery.  I have reviewed the patient's chart and labs.  Questions were answered to the patient's satisfaction.    Armandina Gemma, [facility] [facility] [place] practice Office: [place]

## 2021-02-27 DIAGNOSIS — Z20822 Contact with and (suspected) exposure to covid-19: Secondary | ICD-10-CM | POA: Diagnosis not present

## 2021-02-27 DIAGNOSIS — K219 Gastro-esophageal reflux disease without esophagitis: Secondary | ICD-10-CM | POA: Diagnosis not present

## 2021-02-27 DIAGNOSIS — E43 Unspecified severe protein-calorie malnutrition: Secondary | ICD-10-CM | POA: Insufficient documentation

## 2021-02-27 DIAGNOSIS — C159 Malignant neoplasm of esophagus, unspecified: Secondary | ICD-10-CM | POA: Diagnosis not present

## 2021-02-27 DIAGNOSIS — Z79899 Other long term (current) drug therapy: Secondary | ICD-10-CM | POA: Diagnosis not present

## 2021-02-27 DIAGNOSIS — Z885 Allergy status to narcotic agent status: Secondary | ICD-10-CM | POA: Diagnosis not present

## 2021-02-27 DIAGNOSIS — Z882 Allergy status to sulfonamides status: Secondary | ICD-10-CM | POA: Diagnosis not present

## 2021-02-27 MED ORDER — OXYCODONE HCL 5 MG PO TABS: 5.0000 mg | ORAL_TABLET | Freq: Three times a day (TID) | ORAL | 0 refills | Status: DC | PRN

## 2021-02-27 MED ORDER — ONDANSETRON HCL 4 MG PO TABS: 4.0000 mg | ORAL_TABLET | Freq: Every day | ORAL | 1 refills | Status: DC | PRN

## 2021-02-27 MED ORDER — TRAMADOL HCL 50 MG PO TABS: 50.0000 mg | ORAL_TABLET | Freq: Four times a day (QID) | ORAL | 0 refills | Status: DC | PRN

## 2021-02-27 NOTE — Progress Notes (Signed)
1 Day Post-Op   Subjective/Chief Complaint: In general feeling well this morning, some expected soreness to the upper abdomen.  Able to tolerate liquids p.o.   Objective: Vital signs in last 24 hours: Temp:  [97.4 F (36.3 C)-98.4 F (36.9 C)] 98.4 F (36.9 C) (10/08 0528) Pulse Rate:  [48-61] 57 (10/08 0749) Resp:  [7-17] 16 (10/08 0528) BP: (142-187)/(76-101) 143/82 (10/08 0749) SpO2:  [90 %-100 %] 96 % (10/08 0528) Last BM Date: 02/25/21  Intake/Output from previous day: 10/07 0701 - 10/08 0700 In: 3727.5 [P.O.:840; I.V.:2787.5; IV Piggyback:100] Out: 310 [Urine:300; Blood:10] Intake/Output this shift: No intake/output data recorded.  Alert, calm Unlabored respirations Abdomen is soft, appropriately tender, nondistended.  Incisions clean dry intact with Dermabond, no cellulitis or hematoma.  G-tube in place without drainage.  Lab Results:  Recent Labs    02/26/21 1422  WBC 7.1  HGB 13.1  HCT 40.3  PLT 195   BMET Recent Labs    02/26/21 1422  CREATININE 0.65   PT/INR No results for input(s): LABPROT, INR in the last 72 hours. ABG No results for input(s): PHART, HCO3 in the last 72 hours.  Invalid input(s): PCO2, PO2  Studies/Results: No results found.  Anti-infectives: Anti-infectives (From admission, onward)    Start     Dose/Rate Route Frequency Ordered Stop   02/26/21 0600  ertapenem (INVANZ) 1,000 mg in sodium chloride 0.9 % 100 mL IVPB        1 g 200 mL/hr over 30 Minutes Intravenous On call to O.R. 02/25/21 0756 02/26/21 1020       Assessment/Plan: s/p Procedure(s): OPEN GASTROSTOMY TUBE PLACEMENT (N/A) Postop day 1 status post G-tube placement.  Pending dietitian evaluation for tube feed recommendations.  G-tube teaching today.  Likely discharge this afternoon versus tomorrow morning once all that is complete.  LOS: 0 days    Clovis Riley 02/27/2021

## 2021-02-27 NOTE — Progress Notes (Signed)
[name] to be D/C'd Home per MD order.  Discussed with the patient and all questions fully answered.  IV catheter discontinued intact. Site without signs and symptoms of complications. Dressing and pressure applied.  An After Visit Summary was printed and given to the patient. Patient prescriptions sent to pharmacy.  D/c education completed with patient/family including follow up instructions, medication list, d/c activities limitations if indicated, with other d/c instructions as indicated by MD - patient able to verbalize understanding, all questions fully answered.   Patient instructed to return to ED, call 911, or call MD for any changes in condition.   Patient escorted via [place], and D/C home via private auto.  Manuella Ghazi 02/27/2021 4:29 PM

## 2021-02-27 NOTE — Progress Notes (Signed)
Brief nutrition follow-up:  Pt previously assessed by outpatient oncology RD on 9/29 (assessment note located in [facility]) and RD provided pt with TF education at that time; RD also provided pt with a complimentary case of Osmolite 1.5 and TF kit  This RD recommends that pt initiate previously recommended TF regimen  Tube Feeding recommendation:  Osmolite 1.5 5 cartons per day Provides 1775 kcals, 75 g of protein, 800 mL free water  Pt received 60 mL syringe in feeding kit  Initiate feedings at 120 mL (2 syringes)  4 times daily today and Sunday On Monday (10/10), increase to alternating between  240 ml (4 syringes) and 120 mL (2 syringes) for a total of 3 cartons per day (240 mL = 1 carton). Further recommendations at follow-up appointment on Tuesday (10/11)  Free Water Administration: 60 mL  (1 syringe) free water before and after each feeding in addition to 120 mL (2 syringes) 5 times daily to meet hydration needs  Pt to continue oral diet as tolerated  Patient to follow up with outpatient oncology RD at discharge.  Pt will see RD at 1245 on Tuesday down in patient and family support center.   Discussed plan in detail with patient's daughter via phone. All questions answered. Pt's husband inquiring about putting berry smoothies through feeding tube. RD discouraged patient and family from putting anything in feeding tube at this time other than TF formula, water (meds too if needed).    Kerman Passey MS, RDN, LDN, CNSC Registered Dietitian III Clinical Nutrition RD Pager and On-Call Pager Number Located in [place]

## 2021-02-28 NOTE — Anesthesia Postprocedure Evaluation (Signed)
Anesthesia Post Note  Patient: [name]  Procedure(s) Performed: OPEN GASTROSTOMY TUBE PLACEMENT (Abdomen)     Patient location during evaluation: PACU Anesthesia Type: General Level of consciousness: awake Pain management: pain level controlled Vital Signs Assessment: post-procedure vital signs reviewed and stable Respiratory status: spontaneous breathing Cardiovascular status: stable Postop Assessment: no apparent nausea or vomiting Anesthetic complications: no   No notable events documented.  Last Vitals:  Vitals:   02/27/21 0941 02/27/21 1332  BP: 115/62 135/75  Pulse: (!) 57 (!) 58  Resp: 18 18  Temp: 36.4 C 36.4 C  SpO2: 97% 98%    Last Pain:  Vitals:   02/27/21 1332  TempSrc: Oral  PainSc:                  Adie Vilar

## 2021-03-01 ENCOUNTER — Other Ambulatory Visit: Payer: Self-pay | Admitting: Nurse Practitioner

## 2021-03-01 ENCOUNTER — Encounter (HOSPITAL_COMMUNITY): Payer: Self-pay | Admitting: Surgery

## 2021-03-01 ENCOUNTER — Other Ambulatory Visit: Payer: Medicare Other

## 2021-03-01 ENCOUNTER — Ambulatory Visit: Payer: Medicare Other | Admitting: Radiation Oncology

## 2021-03-01 DIAGNOSIS — C153 Malignant neoplasm of upper third of esophagus: Secondary | ICD-10-CM

## 2021-03-01 DIAGNOSIS — R1310 Dysphagia, unspecified: Secondary | ICD-10-CM | POA: Diagnosis not present

## 2021-03-01 DIAGNOSIS — C159 Malignant neoplasm of esophagus, unspecified: Secondary | ICD-10-CM | POA: Diagnosis not present

## 2021-03-02 ENCOUNTER — Other Ambulatory Visit: Payer: Medicare Other

## 2021-03-02 ENCOUNTER — Ambulatory Visit: Payer: Medicare Other

## 2021-03-02 ENCOUNTER — Other Ambulatory Visit: Payer: Self-pay

## 2021-03-02 ENCOUNTER — Inpatient Hospital Stay: Payer: Medicare Other

## 2021-03-02 ENCOUNTER — Inpatient Hospital Stay: Payer: Medicare Other | Admitting: Dietician

## 2021-03-02 DIAGNOSIS — C153 Malignant neoplasm of upper third of esophagus: Secondary | ICD-10-CM | POA: Diagnosis present

## 2021-03-02 DIAGNOSIS — I10 Essential (primary) hypertension: Secondary | ICD-10-CM | POA: Diagnosis not present

## 2021-03-02 DIAGNOSIS — R1310 Dysphagia, unspecified: Secondary | ICD-10-CM | POA: Diagnosis not present

## 2021-03-02 DIAGNOSIS — R634 Abnormal weight loss: Secondary | ICD-10-CM | POA: Diagnosis not present

## 2021-03-02 DIAGNOSIS — Z5111 Encounter for antineoplastic chemotherapy: Secondary | ICD-10-CM | POA: Diagnosis not present

## 2021-03-02 DIAGNOSIS — M792 Neuralgia and neuritis, unspecified: Secondary | ICD-10-CM | POA: Diagnosis not present

## 2021-03-02 LAB — MAGNESIUM: Magnesium: 2 mg/dL (ref 1.7–2.4)

## 2021-03-02 LAB — PHOSPHORUS: Phosphorus: 2.5 mg/dL (ref 2.5–4.6)

## 2021-03-02 LAB — CBC WITH DIFFERENTIAL (CANCER CENTER ONLY)
Abs Immature Granulocytes: 0.02 10*3/uL (ref 0.00–0.07)
Basophils Absolute: 0 10*3/uL (ref 0.0–0.1)
Basophils Relative: 1 %
Eosinophils Absolute: 0.3 10*3/uL (ref 0.0–0.5)
Eosinophils Relative: 4 %
HCT: 39.3 % (ref 36.0–46.0)
Hemoglobin: 13.1 g/dL (ref 12.0–15.0)
Immature Granulocytes: 0 %
Lymphocytes Relative: 13 %
Lymphs Abs: 0.9 10*3/uL (ref 0.7–4.0)
MCH: 29.6 pg (ref 26.0–34.0)
MCHC: 33.3 g/dL (ref 30.0–36.0)
MCV: 88.7 fL (ref 80.0–100.0)
Monocytes Absolute: 0.5 10*3/uL (ref 0.1–1.0)
Monocytes Relative: 8 %
Neutro Abs: 5.3 10*3/uL (ref 1.7–7.7)
Neutrophils Relative %: 74 %
Platelet Count: 233 10*3/uL (ref 150–400)
RBC: 4.43 MIL/uL (ref 3.87–5.11)
RDW: 13.2 % (ref 11.5–15.5)
WBC Count: 7.1 10*3/uL (ref 4.0–10.5)
nRBC: 0 % (ref 0.0–0.2)

## 2021-03-02 LAB — CMP (CANCER CENTER ONLY)
ALT: 15 U/L (ref 0–44)
AST: 21 U/L (ref 15–41)
Albumin: 3.5 g/dL (ref 3.5–5.0)
Alkaline Phosphatase: 86 U/L (ref 38–126)
Anion gap: 8 (ref 5–15)
BUN: 20 mg/dL (ref 8–23)
CO2: 30 mmol/L (ref 22–32)
Calcium: 9.9 mg/dL (ref 8.9–10.3)
Chloride: 102 mmol/L (ref 98–111)
Creatinine: 0.79 mg/dL (ref 0.44–1.00)
GFR, Estimated: >60 mL/min (ref >60)
Glucose, Bld: 83 mg/dL (ref 70–99)
Potassium: 4.2 mmol/L (ref 3.5–5.1)
Sodium: 140 mmol/L (ref 135–145)
Total Bilirubin: 0.6 mg/dL (ref 0.3–1.2)
Total Protein: 6.7 g/dL (ref 6.5–8.1)

## 2021-03-02 NOTE — Progress Notes (Signed)
Nutrition Follow-up:  Patient with newly diagnosed esophageal cancer. S/p open G-tube placement 10/7. Patient pending start of chemoradiation therapy with weekly carbo/taxol.   Met with patient and husband in clinic. She reports abdominal soreness s/p G-tube. Patient is drinking 1 Boost High Protein and water by mouth. She had small bowl of soup and ~1 cup of fruit smoothie for dinner last night. Patient has started giving tube feedings. She is slowly advancing tube feedings towards goal, plans to increase to 4 cartons Osmolite 1.5 today. Patient  reports tolerating tube feedings well. Husband reports difficulties with morning bolus due to syringe being pushed out of feeding port. Examined feeding tube in clinic today, noted G-tube did not have a clamp. Lopez valve attached resolving issue and patient agreeable to completing feeding in office this afternoon. Tube flushed without difficulty, however formula become clogged in tube multiple times, requiring additional water as well as use of syringe plunger. Patient composed and tolerated feeding well. Adjustments were made to [facility] valve connection to tube in efforts to improve rate of flow.   Medications: reviewed   Labs: K 4.2, Mg 2.0, Phos 2.5 (WNL)  Anthropometrics: Last weight 117 lb 15.1 oz on 10/7 decreased ~4 lbs (3.3%) in 7 days. This is significant  Estimated Energy Needs  Kcals: [phone] Protein: 72-88 Fluid: 1.7 L  NUTRITION DIAGNOSIS: Inadequate oral intake ongoing, pt supplementing with tube feeds   INTERVENTION:  Continue drinking Boost High Protein orally as tolerated Patient progressing towards tube feeding goal of 5 cartons Osmolite 1.5  Patient reports having supplies and formula Encouraged to contact surgeons office to advise further tube recommendations s/p Eugenio Hoes valve Patient has contact information  Give 1 carton Osmolite 1.5 via tube 5/day. Flush tube with 60 ml water before and after each feeding. Drink by mouth or  give via tube additional 1 1/2 cup fluids daily. -Provides 1775 kcal, 74.5 grams protein, 1865 ml total water.   MONITORING, EVALUATION, GOAL: weight trends, tube feedings, oral intake   NEXT VISIT: Tuesday October 18 during infusion

## 2021-03-03 ENCOUNTER — Ambulatory Visit: Payer: Medicare Other

## 2021-03-04 ENCOUNTER — Ambulatory Visit: Payer: Medicare Other

## 2021-03-04 ENCOUNTER — Ambulatory Visit (HOSPITAL_COMMUNITY): Payer: Medicare Other

## 2021-03-04 ENCOUNTER — Other Ambulatory Visit (HOSPITAL_COMMUNITY): Payer: Medicare Other

## 2021-03-05 ENCOUNTER — Ambulatory Visit: Payer: Medicare Other

## 2021-03-05 DIAGNOSIS — Z510 Encounter for antineoplastic radiation therapy: Secondary | ICD-10-CM | POA: Diagnosis not present

## 2021-03-05 DIAGNOSIS — C153 Malignant neoplasm of upper third of esophagus: Secondary | ICD-10-CM | POA: Diagnosis not present

## 2021-03-07 DIAGNOSIS — R1310 Dysphagia, unspecified: Secondary | ICD-10-CM | POA: Diagnosis not present

## 2021-03-07 DIAGNOSIS — C159 Malignant neoplasm of esophagus, unspecified: Secondary | ICD-10-CM | POA: Diagnosis not present

## 2021-03-08 ENCOUNTER — Ambulatory Visit: Payer: Medicare Other | Admitting: Radiation Oncology

## 2021-03-08 ENCOUNTER — Other Ambulatory Visit: Payer: Self-pay

## 2021-03-08 ENCOUNTER — Ambulatory Visit
Admission: RE | Admit: 2021-03-08 | Discharge: 2021-03-08 | Disposition: A | Discharge status: Home / Self Care | Payer: Medicare Other | Source: Ambulatory Visit | Attending: Radiation Oncology | Admitting: Radiation Oncology

## 2021-03-08 ENCOUNTER — Telehealth: Payer: Self-pay | Admitting: Adult Health

## 2021-03-08 ENCOUNTER — Other Ambulatory Visit: Payer: Self-pay | Admitting: Hematology

## 2021-03-08 DIAGNOSIS — C153 Malignant neoplasm of upper third of esophagus: Secondary | ICD-10-CM | POA: Diagnosis not present

## 2021-03-08 DIAGNOSIS — Z510 Encounter for antineoplastic radiation therapy: Secondary | ICD-10-CM | POA: Diagnosis not present

## 2021-03-08 NOTE — Progress Notes (Signed)
[place]   [phone] [phone]   Clinic Follow up Note   Patient Care Team: [name], [name], MD as PCP - General [name], MD as PCP - Cardiology (Cardiology) Cindie Crumbly (Neurosurgery) Irine Seal, MD as Attending Physician (Urology) Erline Levine, MD as Consulting Physician (Neurosurgery) [name], MD as Consulting Physician (Neurology) Viona Gilmore, [facility] as Pharmacist (Pharmacist) [name], MD as Consulting Physician (Gastroenterology) Carol Ada, MD as Consulting Physician (Gastroenterology) [name], MD as Consulting Physician (Otolaryngology) [name], MD as Consulting Physician (Hematology) Kyung Rudd, MD as Consulting Physician (Radiation Oncology) 03/09/2021  CHIEF COMPLAINT: Follow up esophageal cancer   SUMMARY OF ONCOLOGIC HISTORY: Oncology History  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  The base of tongue vallecula and epiglottis were normal.    Piriform sinuses were clear bilaterally and vocal cords were clear with  normal vocal mobility.  No structural abnormalities noted.   01/22/2021 Imaging   DG esophagus IMPRESSION: 1. Luminal narrowing in the high cervical esophagus over a 3 cm segment is highly concerning for esophageal neoplasm. Inflammatory process would be a secondary consideration. Luminal narrowing occurs approximately at the C5-C7 vertebral body level just below the glottis. Patient experienced several episodes of choking related to this luminal narrowing. Recommend expedient upper GI endoscopy for evaluation. 2. Distal thoracic esophagus and GE junction appear normal.   02/04/2021 Procedure   EGD by Dr. Carlean Purl impression- One mass-like severe stenosis was found in the upper third of the esophagus. The stenosis was not  traversed.   02/05/2021 Imaging   CT soft tissue neck w contrast IMPRESSION: Mass-like soft tissue thickening and mucosal hyperenhancement of the cervical esophagus and hypopharynx, spanning the C4-T1 levels, likely reflecting an esophageal malignancy. This measures up to 2.4 x 3.4 cm in transaxial dimensions, and 6.3 cm in craniocaudal dimension. Associated severe effacement of the esophageal lumen.   Centrally necrotic right paratracheal lymph node beneath the level of the mass, measuring 1.3 x 0.9 cm, and likely reflecting a site of nodal metastatic disease.   Additional lymph nodes along the posterior and inferior aspect of the right thyroid lobe (along the right aspect of the esophageal mass), which measure subcentimeter but are asymmetrically prominent and highly suspicious for additional sites of nodal metastatic disease.   02/12/2021 Pathology Results   FINAL MICROSCOPIC DIAGNOSIS:  A. ESOPHAGUS, UPPER CERVICAL #1, BIOPSY:  - Squamous cell carcinoma.  B. ESOPHAGUS, UPPER CERVICAL #2, BIOPSY:  - Squamous cell carcinoma.    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   Markedly hypermetabolic right upper paratracheal lymph node, compatible with metastatic disease.   Small right upper paratracheal and right level IIA lymph nodes with mild hypermetabolic activity, concerning for additional sites of nodal metastatic disease.   Aortic Atherosclerosis (ICD10-I70.0).   03/09/2021 -  Chemotherapy   Patient is on Treatment Plan : ESOPHAGUS Carboplatin/PACLitaxel  weekly x 6 weeks with XRT  CURRENT THERAPY: concurrent chemoRT with carbo/taxol, RT started 03/08/21, chemo to start 03/09/21   INTERVAL HISTORY: [name] returns for follow up and treatment as scheduled. She was last seen 02/18/21 in initial consult. She was seen by Rad onc and had staging PET scan on 10/4, then underwent feeding tube placement by Dr. Harlow Asa 02/26/21.  She has some abdominal irritation and soreness but no overt pain, stitches remain in place.  They are using the tube for 5 feedings throughout the day (2 AM, 1 noon, 2 PM) and most medicine, she still is able to tolerate sips by mouth and yogurt with some meds.  Denies much odynophagia.  Her main pain is at the right trigeminal nerve, with occasional "lightening bolt" types of pain.  She has contacted Dr. Cathren Laine office.  She notes that the toes and bottoms of her feet "do not feel right" but has never had a diagnosis of neuropathy.  Bowels moving well, no nausea or vomiting.  She has mild cough from irritation from the tumor, no fever, chills, chest pain, dyspnea.  She has mild pedal edema, on Lasix.   MEDICAL HISTORY:  Past Medical History:  Diagnosis Date   Abdominal pain 05/29/2013   s/p rx of cephalo resistant e coli   but last rx NG  now residular ?  bladder sx repeat cx sx rx to ty and uro consult    ADJ DISORDER WITH MIXED ANXIETY & DEPRESSED MOOD 03/03/2010   Qualifier: Diagnosis of  By: [name], [name]    Agent resistant to multiple antibiotics 05/29/2013   e coli   bu NG on fu.     Anemia    Anxiety    ARF (acute renal failure) ([place]) 03/12/2015   Cancer ([place])    Closed head injury 02/01/2011   from syncope and had scalp laceration  neg ct .     Closed head injury 5-6 yrs ago   Colitis 99/35/7017   Complication of anesthesia    migraine several hours after general anesthesia   Depression    Fatty liver    Gall stones 2016   see ct scan neg HIDA    GERD (gastroesophageal reflux disease)     Hearing aid worn    HOH (hard of hearing)    both ears   Hyperlipidemia    Hypertension    echo nl lv function  mild dilitation 2009   Kidney infection    few yrs ago in hospital   Medication side effect 09/02/2010   Poss muscle se of 10 crestor    Migraine    hypnic HA eval by Dr. 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    Pyelonephritis 03/12/2015   Sensation of pain in anesthetized distribution of trigeminal nerve    Syncope 02/01/2011   In shower on vacation  sustained head laceration  8 sutures Had ed visit neg head ct labs and x ray    Trigeminal neuralgia pain     SURGICAL HISTORY: Past Surgical History:  Procedure Laterality Date   ABDOMINAL HYSTERECTOMY  2002   tubal   BACK SURGERY     2 times, for sciatic nerve pain   CARDIAC CATHETERIZATION  2000   chest pains neg   CHOLECYSTECTOMY N/A 02/21/2017   Procedure: LAPAROSCOPIC CHOLECYSTECTOMY WITH INTRAOPERATIVE CHOLANGIOGRAM;  Surgeon: Armandina Gemma, MD;  Location: [facility];  Service: General;  Laterality: N/A;   COLONOSCOPY  multiple   CRANIOTOMY  12/09/2011   nerve decompression right trigeminal    DIRECT LARYNGOSCOPY N/A 02/12/2021   Procedure: DIRECT LARYNGOSCOPY AND BIOPSY POSSIBLE FROZEN;  Surgeon: [name], MD;  Location: [facility];  Service: ENT;  Laterality: N/A;   DOPPLER ECHOCARDIOGRAPHY  2009   nl lv function mild lv dilitation   EYE SURGERY Bilateral    ioc for catatracts   GASTROSTOMY N/A 02/26/2021   Procedure: OPEN GASTROSTOMY TUBE PLACEMENT;  Surgeon: Armandina Gemma, MD;  Location: [facility];  Service: General;  Laterality: N/A;   IR GASTROSTOMY TUBE MOD SED  02/22/2021   laparoscopic gallbladder surgery  02/16/2017   Fax from [facility] Bilateral 2002   rt shoulder surgery      I have reviewed the social history and family history with the patient and they are unchanged from previous note.  ALLERGIES:  is allergic  to hydrocodone and sulfamethoxazole-trimethoprim.  MEDICATIONS:  Current Outpatient Medications  Medication Sig Dispense Refill   acetaminophen (TYLENOL) 325 MG tablet Take 650 mg by mouth every 6 (six) hours as needed for moderate pain.     alendronate (FOSAMAX) 70 MG tablet TAKE 1 TABLET BY MOUTH 1 TIME A WEEK WITH FULL GLASS OF WATER AND ON AN EMPTY STOMACH AS DIRECTED 4 tablet 2   benazepril (LOTENSIN) 20 MG tablet Take 1 tablet (20 mg total) by mouth 2 (two) times daily. 180 tablet 1   Calcium Carb-Cholecalciferol (CALCIUM 600+D) 600-800 MG-UNIT TABS Take 1 tablet by mouth 2 (two) times daily.     carvedilol (COREG) 25 MG tablet TAKE 1 TABLET(25 MG) BY MOUTH TWICE DAILY WITH A MEAL 60 tablet 3   conjugated estrogens (PREMARIN) vaginal cream Place 1 applicator vaginally 2 (two) times a week.     fluticasone (FLONASE) 50 MCG/ACT nasal spray Place 2 sprays into both nostrils as needed for allergies or rhinitis. 16 g 5   furosemide (LASIX) 40 MG tablet Take 1 tablet (40 mg total) by mouth daily. 30 tablet 3   lamoTRIgine (LAMICTAL) 100 MG tablet Take 2 pills in the morning(22m) and evening. May take an additional 1/2 pill(568m if needed with the morning and evening doses (Patient taking differently: Takes 100 mg in the morning and 150 mg in the evening.) 150 tablet 11   Multiple Vitamin (MULTIVITAMIN ADULT PO) Take 1 tablet by mouth daily.     omeprazole (PRILOSEC) 20 MG capsule Take 1 capsule (20 mg total) by mouth daily. (Patient taking differently: Take 20 mg by mouth daily as needed (acid reflux).) 30 capsule 2   ondansetron (ZOFRAN) 4 MG tablet Take 1 tablet (4 mg total) by mouth daily as needed for nausea or vomiting. 30 tablet 1   ondansetron (ZOFRAN) 8 MG tablet Take 1 tablet (8 mg total) by mouth 2 (two) times daily as needed for refractory nausea / vomiting. Start on day 3 after chemo. 30 tablet 1   ondansetron (ZOFRAN-ODT) 4 MG disintegrating tablet Take 1-2 tablets (4-8 mg total) by  mouth every 8 (eight) hours as needed for nausea. 60 tablet 3   Oxcarbazepine (TRILEPTAL) 300 MG tablet Take 0.5 tablets (150 mg total) by mouth 2 (two) times daily. 30 tablet 5   Polyethyl Glycol-Propyl Glycol (SYSTANE) 0.4-0.3 % GEL ophthalmic gel Place 1 application into both eyes 2 (two) times daily as needed (dry eyes).     prochlorperazine (COMPAZINE) 10 MG tablet Take 1 tablet (10 mg total) by mouth every  6 (six) hours as needed (Nausea or vomiting). 30 tablet 1   No current facility-administered medications for this visit.   Facility-Administered Medications Ordered in Other Visits  Medication Dose Route Frequency Provider Last Rate Last Admin   CARBOplatin (PARAPLATIN) 130 mg in sodium chloride 0.9 % 100 mL chemo infusion  130 mg Intravenous Once [name], MD       PACLitaxel (TAXOL) 78 mg in sodium chloride 0.9 % 150 mL chemo infusion (</= 68m/m2)  50 mg/m2 (Treatment Plan Recorded) Intravenous Once FTruitt Merle MD 10 mL/hr at 03/09/21 1404 78 mg at 03/09/21 1404    PHYSICAL EXAMINATION: ECOG PERFORMANCE STATUS: 1 - Symptomatic but completely ambulatory  Vitals:   03/09/21 1009  BP: 125/66  Pulse: 68  Resp: 20  Temp: (!) 97.5 F (36.4 C)  SpO2: 98%   Filed Weights   03/09/21 1009  Weight: 123 lb 3.2 oz (55.9 kg)    GENERAL:alert, no distress and comfortable SKIN: no rash  EYES: sclera clear LUNGS: clear with normal breathing effort HEART: regular rate & rhythm, mild pedal edema ABDOMEN:abdomen soft, non-tender and normal bowel sounds. G tube sutured to abdominal wall, no obvious erythema or drainage. Dressing c/d/i NEURO: alert & oriented x 3 with fluent speech, no focal motor/sensory deficits  LABORATORY DATA:  I have reviewed the data as listed CBC Latest Ref Rng & Units 03/09/2021 03/02/2021 02/26/2021  WBC 4.0 - 10.5 K/uL 7.7 7.1 7.1  Hemoglobin 12.0 - 15.0 g/dL 13.0 13.1 13.1  Hematocrit 36.0 - 46.0 % 40.7 39.3 40.3  Platelets 150 - 400 K/uL 257 233 195      CMP Latest Ref Rng & Units 03/09/2021 03/02/2021 02/26/2021  Glucose 70 - 99 mg/dL 202(H) 83 -  BUN 8 - 23 mg/dL 29(H) 20 -  Creatinine 0.44 - 1.00 mg/dL 0.83 0.79 0.65  Sodium 135 - 145 mmol/L 140 140 -  Potassium 3.5 - 5.1 mmol/L 4.1 4.2 -  Chloride 98 - 111 mmol/L 102 102 -  CO2 22 - 32 mmol/L 29 30 -  Calcium 8.9 - 10.3 mg/dL 10.0 9.9 -  Total Protein 6.5 - 8.1 g/dL 6.6 6.7 -  Total Bilirubin 0.3 - 1.2 mg/dL 0.5 0.6 -  Alkaline Phos 38 - 126 U/L 81 86 -  AST 15 - 41 U/L 19 21 -  ALT 0 - 44 U/L 17 15 -      RADIOGRAPHIC STUDIES: I have personally reviewed the radiological images as listed and agreed with the findings in the report. No results found.   ASSESSMENT & PLAN: 80yo female    1.Squamous cell carcinoma of cervical esophagus  -She developed dysphagia and weight loss, work up showed 3x6 cm mass in the cervical/upper esophagus with possible local adenopathy. Path confirmed squamous cell carcinoma of the esophagus  -She had feeding tube placed by Dr. GHarlow Asa10/7/22  -PET scan shows hypermetabolic esophageal mass and hypermetabolic paratracheal nodes, negative to distant metastasis. This is local disease  -despite local disease, due to the location in the cervical esophagus, Dr. LKipp Broodfeels she is not a surgical candidate. Her case was reviewed in tumor board  -she was seen by rad once, the consensus is chemoRT with carbo/taxol. She consented and attended chemo education teaching.  -Ms. Finau appears stable. Tolerating tube feeds. Her intake is mostly via tube, with only sips and small yogurt with meds.  -she began RT on 03/08/21, will begin first chemo today 03/09/21, taxol/carbo weekly during RT -we reviewed  potential benefit, side effects, and symptom management. Goal is curative.  -she has mild abnormal nerve sensation in the feet, no previous diagnosis of neuropathy. Will monitor closely on taxol.  -I reviewed PDL1 testing is 95% positive, predicting a  good response to immunotherapy which we plan to give after chemoRT for up to 2 years. Patient is interested. She may want a port a cath at some point, will discuss another day. -labs adequate to proceed with cycle 1 carbo/taxol today as planned.  -f/up with weekly chemo while on RT   2.Dysphagia, weight loss  -Secondary to #1 -She has not tolerated solids lately, only pureed or liquid diet with difficulty. Can change meds to liquid as applicable  -She is a candidate for feeding tube, IR could not access the stomach. She underwent surgical placement by Dr. Harlow Asa on 02/26/21 -tolerating 5 bolus feeds per day, 2 am, 1 noon, 2 pm.  -no signs of refeeding syndrome. F/up with dietician  -I recommend 5-6 cans more evenly spaced throughout the day so she has feeding/water every couple hours on chemoRT -weight stable to slightly increased    3.Trigeminal neuralgia, sciatica, headaches -On lamotrigene and oxcarbazepine  -pain has worsened lately, will reach out to Dr. Cathren Laine office    4.HTN -continue benazepril, furosemide -todays BP 125/66, I reduced benazepril to once daily, and let her know to hold lasix if she becomes dizzy at home   -monitoring closely    5.Goals of Care -most recent code status is DNR from hospitalization 05/16/20, we did not discuss this at today's consult  -goal of chemoRT is curative   PLAN: -Labs and PDL1 reviewed, proceed with cycle 1 carbo/taxol today and continue RT -Reviewed chemo SE management -Contacted Dr. Cathren Laine office for trigeminal neuralgia  -Reviewed tube feedings, space out 5-6 per day with water, 1 every couple hours  -Reduce lotensin to once daily, monitor BP. Hold lasix if dizzy at home -F/up in 1 week with cycle/week 2  All questions were answered. The patient knows to call the clinic with any problems, questions or concerns. No barriers to learning were detected. Total encounter time was 30 minutes.      [name], NP 03/09/21

## 2021-03-08 NOTE — Telephone Encounter (Signed)
I called and LMVM for home RE: the dosing of lamotrigine and trileptal for pt. (Changing to liquid form).

## 2021-03-08 NOTE — Telephone Encounter (Signed)
[name] is asking for the liquid form of both Oxcarbazepine (TRILEPTAL) 300 MG tablet & lamoTRIgine (LAMICTAL) 100 MG tablet since pt is now on a feeding tube and husband does not like breaking the pills up

## 2021-03-08 NOTE — Progress Notes (Signed)
Verbal orders given for CBC w/Diff and CMP per Dr. [name].

## 2021-03-08 NOTE — Telephone Encounter (Signed)
Pt's husband, Jenny Reichmann (on [place]), called back.  He states unfortunately the patient has developed an esophageal cancer since the last time she was seen in our office.  This was a recent diagnosis in the last 3 to 4 weeks.  Patient had her first radiation treatment today and will have her first chemo treatment tomorrow.  Total treatment time currently will be 5.5 weeks.  He reports the cancer is localized to the junction of the throat and esophagus.  It has progressed so bad she cannot eat.  She currently has a feeding tube.  He confirmed the patient is taking Oxcarbazepine 150 mg BID.  She takes lamotrigine 100 mg in the morning and 150 mg at night. He crushes the medication and adds it to a little bit of yogurt that she takes orally but it is getting more difficult and he states her oncologist recommends liquid meds soon. She does report recent increase in breakthrough pain. I advised a message will be sent over to Dr. Jaynee Eagles. He verbalized appreciation.

## 2021-03-09 ENCOUNTER — Ambulatory Visit
Admission: RE | Admit: 2021-03-09 | Discharge: 2021-03-09 | Disposition: A | Discharge status: Home / Self Care | Payer: Medicare Other | Source: Ambulatory Visit | Attending: Radiation Oncology | Admitting: Radiation Oncology

## 2021-03-09 ENCOUNTER — Inpatient Hospital Stay: Payer: Medicare Other

## 2021-03-09 ENCOUNTER — Ambulatory Visit: Payer: Medicare Other

## 2021-03-09 ENCOUNTER — Inpatient Hospital Stay: Payer: Medicare Other | Admitting: Nurse Practitioner

## 2021-03-09 ENCOUNTER — Other Ambulatory Visit: Payer: Self-pay

## 2021-03-09 ENCOUNTER — Inpatient Hospital Stay: Payer: Medicare Other | Admitting: Dietician

## 2021-03-09 ENCOUNTER — Encounter: Payer: Self-pay | Admitting: Nurse Practitioner

## 2021-03-09 VITALS — BP 125/66 | HR 68 | Temp 97.5°F | Resp 20 | Wt 123.2 lb

## 2021-03-09 VITALS — BP 145/76 | HR 75 | Resp 16

## 2021-03-09 DIAGNOSIS — Z5111 Encounter for antineoplastic chemotherapy: Secondary | ICD-10-CM | POA: Diagnosis not present

## 2021-03-09 DIAGNOSIS — R634 Abnormal weight loss: Secondary | ICD-10-CM | POA: Diagnosis not present

## 2021-03-09 DIAGNOSIS — I10 Essential (primary) hypertension: Secondary | ICD-10-CM | POA: Diagnosis not present

## 2021-03-09 DIAGNOSIS — R1310 Dysphagia, unspecified: Secondary | ICD-10-CM | POA: Diagnosis not present

## 2021-03-09 DIAGNOSIS — C153 Malignant neoplasm of upper third of esophagus: Secondary | ICD-10-CM | POA: Diagnosis not present

## 2021-03-09 DIAGNOSIS — Z510 Encounter for antineoplastic radiation therapy: Secondary | ICD-10-CM | POA: Diagnosis not present

## 2021-03-09 DIAGNOSIS — M792 Neuralgia and neuritis, unspecified: Secondary | ICD-10-CM | POA: Diagnosis not present

## 2021-03-09 LAB — CMP (CANCER CENTER ONLY)
ALT: 17 U/L (ref 0–44)
AST: 19 U/L (ref 15–41)
Albumin: 3.4 g/dL — ABNORMAL LOW (ref 3.5–5.0)
Alkaline Phosphatase: 81 U/L (ref 38–126)
Anion gap: 9 (ref 5–15)
BUN: 29 mg/dL — ABNORMAL HIGH (ref 8–23)
CO2: 29 mmol/L (ref 22–32)
Calcium: 10 mg/dL (ref 8.9–10.3)
Chloride: 102 mmol/L (ref 98–111)
Creatinine: 0.83 mg/dL (ref 0.44–1.00)
GFR, Estimated: >60 mL/min (ref >60)
Glucose, Bld: 202 mg/dL — ABNORMAL HIGH (ref 70–99)
Potassium: 4.1 mmol/L (ref 3.5–5.1)
Sodium: 140 mmol/L (ref 135–145)
Total Bilirubin: 0.5 mg/dL (ref 0.3–1.2)
Total Protein: 6.6 g/dL (ref 6.5–8.1)

## 2021-03-09 LAB — CBC WITH DIFFERENTIAL (CANCER CENTER ONLY)
Abs Immature Granulocytes: 0.02 10*3/uL (ref 0.00–0.07)
Basophils Absolute: 0.1 10*3/uL (ref 0.0–0.1)
Basophils Relative: 1 %
Eosinophils Absolute: 0.4 10*3/uL (ref 0.0–0.5)
Eosinophils Relative: 5 %
HCT: 40.7 % (ref 36.0–46.0)
Hemoglobin: 13 g/dL (ref 12.0–15.0)
Immature Granulocytes: 0 %
Lymphocytes Relative: 11 %
Lymphs Abs: 0.9 10*3/uL (ref 0.7–4.0)
MCH: 29 pg (ref 26.0–34.0)
MCHC: 31.9 g/dL (ref 30.0–36.0)
MCV: 90.8 fL (ref 80.0–100.0)
Monocytes Absolute: 0.6 10*3/uL (ref 0.1–1.0)
Monocytes Relative: 8 %
Neutro Abs: 5.9 10*3/uL (ref 1.7–7.7)
Neutrophils Relative %: 75 %
Platelet Count: 257 10*3/uL (ref 150–400)
RBC: 4.48 MIL/uL (ref 3.87–5.11)
RDW: 13.2 % (ref 11.5–15.5)
WBC Count: 7.7 10*3/uL (ref 4.0–10.5)
nRBC: 0 % (ref 0.0–0.2)

## 2021-03-09 MED ORDER — SODIUM CHLORIDE 0.9 % IV SOLN
10.0000 mg | Freq: Once | INTRAVENOUS | Status: DC
  Filled 2021-03-09: qty 1

## 2021-03-09 MED ORDER — LAMOTRIGINE 25 MG PO TBDP: ORAL_TABLET | ORAL | 3 refills | Status: DC

## 2021-03-09 MED ORDER — FAMOTIDINE 20 MG IN NS 100 ML IVPB
20.0000 mg | Freq: Once | INTRAVENOUS | Status: AC
  Administered 2021-03-09: 20 mg via INTRAVENOUS
  Filled 2021-03-09: qty 100

## 2021-03-09 MED ORDER — DIPHENHYDRAMINE HCL 50 MG/ML IJ SOLN
50.0000 mg | Freq: Once | INTRAMUSCULAR | Status: AC
  Administered 2021-03-09: 50 mg via INTRAVENOUS
  Filled 2021-03-09: qty 1

## 2021-03-09 MED ORDER — SODIUM CHLORIDE 0.9 % IV SOLN
50.0000 mg/m2 | Freq: Once | INTRAVENOUS | Status: AC
  Administered 2021-03-09: 78 mg via INTRAVENOUS
  Filled 2021-03-09: qty 13

## 2021-03-09 MED ORDER — LAMOTRIGINE 100 MG PO TBDP: ORAL_TABLET | ORAL | 3 refills | Status: DC

## 2021-03-09 MED ORDER — SODIUM CHLORIDE 0.9 % IV SOLN
130.0000 mg | Freq: Once | INTRAVENOUS | Status: AC
  Administered 2021-03-09: 130 mg via INTRAVENOUS
  Filled 2021-03-09: qty 13

## 2021-03-09 MED ORDER — OXCARBAZEPINE 300 MG/5ML PO SUSP: 150.0000 mg | Freq: Two times a day (BID) | ORAL | 3 refills | Status: DC

## 2021-03-09 MED ORDER — PALONOSETRON HCL INJECTION 0.25 MG/5ML
0.2500 mg | Freq: Once | INTRAVENOUS | Status: AC
  Administered 2021-03-09: 0.25 mg via INTRAVENOUS
  Filled 2021-03-09: qty 5

## 2021-03-09 MED ORDER — SODIUM CHLORIDE 0.9 % IV SOLN: Freq: Once | INTRAVENOUS | Status: AC

## 2021-03-09 MED ORDER — SODIUM CHLORIDE 0.9 % IV SOLN
10.0000 mg | Freq: Once | INTRAVENOUS | Status: AC
  Administered 2021-03-09: 10 mg via INTRAVENOUS
  Filled 2021-03-09: qty 10

## 2021-03-09 NOTE — Patient Instructions (Signed)
[facility]   Discharge Instructions: Thank you for choosing [facility] to provide your oncology and hematology care.   If you have a lab appointment with the Cancer Center, please go directly to the Cancer Center and check in at the registration area.   Wear comfortable clothing and clothing appropriate for easy access to any Portacath or PICC line.   We strive to give you quality time with your provider. You may need to reschedule your appointment if you arrive late (15 or more minutes).  Arriving late affects you and other patients whose appointments are after yours.  Also, if you miss three or more appointments without notifying the office, you may be dismissed from the clinic at the provider's discretion.      For prescription refill requests, have your pharmacy contact our office and allow 72 hours for refills to be completed.    Today you received the following chemotherapy and/or immunotherapy agents: paclitaxel and carboplatin.      To help prevent nausea and vomiting after your treatment, we encourage you to take your nausea medication as directed.  BELOW ARE SYMPTOMS THAT SHOULD BE REPORTED IMMEDIATELY: *FEVER GREATER THAN 100.4 F (38 C) OR HIGHER *CHILLS OR SWEATING *NAUSEA AND VOMITING THAT IS NOT CONTROLLED WITH YOUR NAUSEA MEDICATION *UNUSUAL SHORTNESS OF BREATH *UNUSUAL BRUISING OR BLEEDING *URINARY PROBLEMS (pain or burning when urinating, or frequent urination) *BOWEL PROBLEMS (unusual diarrhea, constipation, pain near the anus) TENDERNESS IN MOUTH AND THROAT WITH OR WITHOUT PRESENCE OF ULCERS (sore throat, sores in mouth, or a toothache) UNUSUAL RASH, SWELLING OR PAIN  UNUSUAL VAGINAL DISCHARGE OR ITCHING   Items with * indicate a potential emergency and should be followed up as soon as possible or go to the Emergency Department if any problems should occur.  Please show the CHEMOTHERAPY ALERT CARD or IMMUNOTHERAPY ALERT  CARD at check-in to the Emergency Department and triage nurse.  Should you have questions after your visit or need to cancel or reschedule your appointment, please contact [facility]  Dept: [phone]  and follow the prompts.  Office hours are 8:00 a.m. to 4:30 p.m. Monday - Friday. Please note that voicemails left after 4:00 p.m. may not be returned until the following business day.  We are closed weekends and major holidays. You have access to a nurse at all times for urgent questions. Please call the main number to the clinic Dept: [phone] and follow the prompts.   For any non-urgent questions, you may also contact your provider using MyChart. We now offer e-Visits for anyone 18 and older to request care online for non-urgent symptoms. For details visit [email or web address].   Also download the MyChart app! Go to the app store, search "MyChart", open the app, select [place] [facility], and log in with your MyChart username and password.  Due to Covid, a mask is required upon entering the hospital/clinic. If you do not have a mask, one will be given to you upon arrival. For doctor visits, patients may have 1 support person aged 18 or older with them. For treatment visits, patients cannot have anyone with them due to current Covid guidelines and our immunocompromised population.   Paclitaxel injection What is this medication? PACLITAXEL (PAK li TAX el) is a chemotherapy drug. It targets fast dividing cells, like cancer cells, and causes these cells to die. This medicine is used to treat ovarian cancer, breast cancer, lung cancer, Kaposi's sarcoma, and other cancers.   This medicine may be used for other purposes; ask your health care provider or pharmacist if you have questions. COMMON BRAND NAME(S): Onxol, Taxol What should I tell my care team before I take this medication? They need to know if you have any of these conditions: history of irregular heartbeat liver  disease low blood counts, like low white cell, platelet, or red cell counts lung or breathing disease, like asthma tingling of the fingers or toes, or other nerve disorder an unusual or allergic reaction to paclitaxel, alcohol, polyoxyethylated castor oil, other chemotherapy, other medicines, foods, dyes, or preservatives pregnant or trying to get pregnant breast-feeding How should I use this medication? This drug is given as an infusion into a vein. It is administered in a hospital or clinic by a specially trained health care professional. Talk to your pediatrician regarding the use of this medicine in children. Special care may be needed. Overdosage: If you think you have taken too much of this medicine contact a poison control center or emergency room at once. NOTE: This medicine is only for you. Do not share this medicine with others. What if I miss a dose? It is important not to miss your dose. Call your doctor or health care professional if you are unable to keep an appointment. What may interact with this medication? Do not take this medicine with any of the following medications: live virus vaccines This medicine may also interact with the following medications: antiviral medicines for hepatitis, HIV or AIDS certain antibiotics like erythromycin and clarithromycin certain medicines for fungal infections like ketoconazole and itraconazole certain medicines for seizures like carbamazepine, phenobarbital, phenytoin gemfibrozil nefazodone rifampin St. John's wort This list may not describe all possible interactions. Give your health care provider a list of all the medicines, herbs, non-prescription drugs, or dietary supplements you use. Also tell them if you smoke, drink alcohol, or use illegal drugs. Some items may interact with your medicine. What should I watch for while using this medication? Your condition will be monitored carefully while you are receiving this medicine. You  will need important blood work done while you are taking this medicine. This medicine can cause serious allergic reactions. To reduce your risk you will need to take other medicine(s) before treatment with this medicine. If you experience allergic reactions like skin rash, itching or hives, swelling of the face, lips, or tongue, tell your doctor or health care professional right away. In some cases, you may be given additional medicines to help with side effects. Follow all directions for their use. This drug may make you feel generally unwell. This is not uncommon, as chemotherapy can affect healthy cells as well as cancer cells. Report any side effects. Continue your course of treatment even though you feel ill unless your doctor tells you to stop. Call your doctor or health care professional for advice if you get a fever, chills or sore throat, or other symptoms of a cold or flu. Do not treat yourself. This drug decreases your body's ability to fight infections. Try to avoid being around people who are sick. This medicine may increase your risk to bruise or bleed. Call your doctor or health care professional if you notice any unusual bleeding. Be careful brushing and flossing your teeth or using a toothpick because you may get an infection or bleed more easily. If you have any dental work done, tell your dentist you are receiving this medicine. Avoid taking products that contain aspirin, acetaminophen, ibuprofen, naproxen, or ketoprofen unless   instructed by your doctor. These medicines may hide a fever. Do not become pregnant while taking this medicine. Women should inform their doctor if they wish to become pregnant or think they might be pregnant. There is a potential for serious side effects to an unborn child. Talk to your health care professional or pharmacist for more information. Do not breast-feed an infant while taking this medicine. Men are advised not to father a child while receiving this  medicine. This product may contain alcohol. Ask your pharmacist or healthcare provider if this medicine contains alcohol. Be sure to tell all healthcare providers you are taking this medicine. Certain medicines, like metronidazole and disulfiram, can cause an unpleasant reaction when taken with alcohol. The reaction includes flushing, headache, nausea, vomiting, sweating, and increased thirst. The reaction can last from 30 minutes to several hours. What side effects may I notice from receiving this medication? Side effects that you should report to your doctor or health care professional as soon as possible: allergic reactions like skin rash, itching or hives, swelling of the face, lips, or tongue breathing problems changes in vision fast, irregular heartbeat high or low blood pressure mouth sores pain, tingling, numbness in the hands or feet signs of decreased platelets or bleeding - bruising, pinpoint red spots on the skin, black, tarry stools, blood in the urine signs of decreased red blood cells - unusually weak or tired, feeling faint or lightheaded, falls signs of infection - fever or chills, cough, sore throat, pain or difficulty passing urine signs and symptoms of liver injury like dark yellow or brown urine; general ill feeling or flu-like symptoms; light-colored stools; loss of appetite; nausea; right upper belly pain; unusually weak or tired; yellowing of the eyes or skin swelling of the ankles, feet, hands unusually slow heartbeat Side effects that usually do not require medical attention (report to your doctor or health care professional if they continue or are bothersome): diarrhea hair loss loss of appetite muscle or joint pain nausea, vomiting pain, redness, or irritation at site where injected tiredness This list may not describe all possible side effects. Call your doctor for medical advice about side effects. You may report side effects to FDA at 1-800-FDA-1088. Where  should I keep my medication? This drug is given in a hospital or clinic and will not be stored at home. NOTE: This sheet is a summary. It may not cover all possible information. If you have questions about this medicine, talk to your doctor, pharmacist, or health care provider.  2022 Elsevier/Gold Standard (2019-04-10 13:37:23)  Carboplatin injection What is this medication? CARBOPLATIN (KAR boe pla tin) is a chemotherapy drug. It targets fast dividing cells, like cancer cells, and causes these cells to die. This medicine is used to treat ovarian cancer and many other cancers. This medicine may be used for other purposes; ask your health care provider or pharmacist if you have questions. COMMON BRAND NAME(S): Paraplatin What should I tell my care team before I take this medication? They need to know if you have any of these conditions: blood disorders hearing problems kidney disease recent or ongoing radiation therapy an unusual or allergic reaction to carboplatin, cisplatin, other chemotherapy, other medicines, foods, dyes, or preservatives pregnant or trying to get pregnant breast-feeding How should I use this medication? This drug is usually given as an infusion into a vein. It is administered in a hospital or clinic by a specially trained health care professional. Talk to your pediatrician regarding the use of this   medicine in children. Special care may be needed. Overdosage: If you think you have taken too much of this medicine contact a poison control center or emergency room at once. NOTE: This medicine is only for you. Do not share this medicine with others. What if I miss a dose? It is important not to miss a dose. Call your doctor or health care professional if you are unable to keep an appointment. What may interact with this medication? medicines for seizures medicines to increase blood counts like filgrastim, pegfilgrastim, sargramostim some antibiotics like amikacin,  gentamicin, neomycin, streptomycin, tobramycin vaccines Talk to your doctor or health care professional before taking any of these medicines: acetaminophen aspirin ibuprofen ketoprofen naproxen This list may not describe all possible interactions. Give your health care provider a list of all the medicines, herbs, non-prescription drugs, or dietary supplements you use. Also tell them if you smoke, drink alcohol, or use illegal drugs. Some items may interact with your medicine. What should I watch for while using this medication? Your condition will be monitored carefully while you are receiving this medicine. You will need important blood work done while you are taking this medicine. This drug may make you feel generally unwell. This is not uncommon, as chemotherapy can affect healthy cells as well as cancer cells. Report any side effects. Continue your course of treatment even though you feel ill unless your doctor tells you to stop. In some cases, you may be given additional medicines to help with side effects. Follow all directions for their use. Call your doctor or health care professional for advice if you get a fever, chills or sore throat, or other symptoms of a cold or flu. Do not treat yourself. This drug decreases your body's ability to fight infections. Try to avoid being around people who are sick. This medicine may increase your risk to bruise or bleed. Call your doctor or health care professional if you notice any unusual bleeding. Be careful brushing and flossing your teeth or using a toothpick because you may get an infection or bleed more easily. If you have any dental work done, tell your dentist you are receiving this medicine. Avoid taking products that contain aspirin, acetaminophen, ibuprofen, naproxen, or ketoprofen unless instructed by your doctor. These medicines may hide a fever. Do not become pregnant while taking this medicine. Women should inform their doctor if they wish  to become pregnant or think they might be pregnant. There is a potential for serious side effects to an unborn child. Talk to your health care professional or pharmacist for more information. Do not breast-feed an infant while taking this medicine. What side effects may I notice from receiving this medication? Side effects that you should report to your doctor or health care professional as soon as possible: allergic reactions like skin rash, itching or hives, swelling of the face, lips, or tongue signs of infection - fever or chills, cough, sore throat, pain or difficulty passing urine signs of decreased platelets or bleeding - bruising, pinpoint red spots on the skin, black, tarry stools, nosebleeds signs of decreased red blood cells - unusually weak or tired, fainting spells, lightheadedness breathing problems changes in hearing changes in vision chest pain high blood pressure low blood counts - This drug may decrease the number of white blood cells, red blood cells and platelets. You may be at increased risk for infections and bleeding. nausea and vomiting pain, swelling, redness or irritation at the injection site pain, tingling, numbness in the hands   or feet problems with balance, talking, walking trouble passing urine or change in the amount of urine Side effects that usually do not require medical attention (report to your doctor or health care professional if they continue or are bothersome): hair loss loss of appetite metallic taste in the mouth or changes in taste This list may not describe all possible side effects. Call your doctor for medical advice about side effects. You may report side effects to FDA at 1-800-FDA-1088. Where should I keep my medication? This drug is given in a hospital or clinic and will not be stored at home. NOTE: This sheet is a summary. It may not cover all possible information. If you have questions about this medicine, talk to your doctor, pharmacist,  or health care provider.  2022 Elsevier/Gold Standard (2007-08-14 14:38:05)   

## 2021-03-09 NOTE — Telephone Encounter (Signed)
Spoke with Dr Jaynee Eagles. Changed Oxcarbazepine to liquid 150 mg BID, gave 3 refills. Lamotrigine 100 mg and 25 mg dissolvable tablets ordered, dose will be 150 mg twice daily (one 100 mg and two 25 mg tablets) twice daily. Dr Jaynee Eagles has increased the morning dose of Lamotrigine to 150 mg due to pt's report of breakthrough pain.   I spoke with patient's husband and explained the changes that are being made to her medication including the increase in lamotrigine dosage and the combination of tablet strengths that he will be administering to the patient each day.  He is aware of the instructions of both lamotrigine and oxcarbazepine.  His questions were answered and he was very appreciative.  I advised him to give [place] a call if he has any questions.

## 2021-03-09 NOTE — Progress Notes (Signed)
Nutrition Follow-up:  Patient with esophageal cancer. S/p open G-tube placement on 10/7. Patient receiving concurrent chemoradiation therapy with weekly carbo/taxol, RT started on 10/17. Patient receiving first chemotherapy today.    Met with patient during infusion. Patient reports feeling a little dizzy and is sleepy. Per RN, pt had received IV Benadryl prior to visit. Patient unable to recall if she had completed a tube feeding today. Patient agreeable to having RD contact her husband nutrition follow-up. Spoke with husband via telephone. He reports patient continues to drink some smoothies and Boost High Protein by mouth, states this is less and less everyday. Patient is tolerating tube feedings at goal, reports Eugenio Hoes valve is working well. She is giving 1 carton Osmolite 1.5 every few hours for 5 feedings/day. Husband reports they would like to decrease amount of feedings to TID, but NP advised to continue with 5 feedings/day due to lower blood pressures. Husband denies nausea, vomiting, diarrhea, constipation. He reports ongoing abdominal tenderness around s/p PEG.    Medications: Decadron, Pepcid, Zofran  Labs: Glucose 202, BUN 29  Anthropometrics: Weight 123 lb 3.2 oz today increased from 117 lb 15.1 oz on 10/7   Estimated Energy Needs  Kcals: 2081-3887 Protein: 72-88 Fluid: 1.7 L  NUTRITION DIAGNOSIS: Inadequate oral intake ongoing, pt relying on tube feedings    INTERVENTION:  Encouraged oral intake of soft, smooth foods as tolerated Continue 1 carton Osmolite 1.5 via tube 5x/day. Flush tube with 60 ml water before and after each feeding. Drink by mouth or give via tube additional 1 1/2 cups fluid/day. Provides 1775 kcal, 74.5 grams protein, 1865 ml total water    MONITORING, EVALUATION, GOAL: weight trends, intake, tube feeding   NEXT VISIT: Tuesday October 25 during infusion

## 2021-03-09 NOTE — Telephone Encounter (Signed)
I see Oxcarbazepine available in liquid but not Lamotrigine. I called pt's pharmacy and was advised by the pharmacist that there is no liquid Lamotrigine however there is a dissolvable tablet in 25 mg, 100 mg, and 200 mg. The tablet cannot be broken in half but should be easily crushable and dissolves. Can do 2 separate scripts. Will d/w Dr Jaynee Eagles.

## 2021-03-09 NOTE — Addendum Note (Signed)
Addended by: Gildardo Griffes on: 03/09/2021 04:33 PM   Modules accepted: Orders

## 2021-03-10 ENCOUNTER — Ambulatory Visit: Payer: Medicare Other

## 2021-03-10 ENCOUNTER — Ambulatory Visit
Admission: RE | Admit: 2021-03-10 | Discharge: 2021-03-10 | Disposition: A | Discharge status: Home / Self Care | Payer: Medicare Other | Source: Ambulatory Visit | Attending: Radiation Oncology | Admitting: Radiation Oncology

## 2021-03-10 DIAGNOSIS — C153 Malignant neoplasm of upper third of esophagus: Secondary | ICD-10-CM | POA: Diagnosis not present

## 2021-03-10 DIAGNOSIS — Z510 Encounter for antineoplastic radiation therapy: Secondary | ICD-10-CM | POA: Diagnosis not present

## 2021-03-11 ENCOUNTER — Other Ambulatory Visit: Payer: Self-pay

## 2021-03-11 ENCOUNTER — Ambulatory Visit
Admission: RE | Admit: 2021-03-11 | Discharge: 2021-03-11 | Disposition: A | Discharge status: Home / Self Care | Payer: Medicare Other | Source: Ambulatory Visit | Attending: Radiation Oncology | Admitting: Radiation Oncology

## 2021-03-11 ENCOUNTER — Ambulatory Visit: Payer: Medicare Other

## 2021-03-11 DIAGNOSIS — Z510 Encounter for antineoplastic radiation therapy: Secondary | ICD-10-CM | POA: Diagnosis not present

## 2021-03-11 DIAGNOSIS — C153 Malignant neoplasm of upper third of esophagus: Secondary | ICD-10-CM | POA: Diagnosis not present

## 2021-03-11 NOTE — Progress Notes (Signed)
Pt here for patient teaching.  Pt given Radiation and You booklet, skin care instructions, and Sonafine.  Reviewed areas of pertinence such as fatigue, hair loss, skin changes, and throat changes . Pt able to give teach back of to pat skin and use unscented/gentle soap,apply Sonafine bid and avoid applying anything to skin within 4 hours of treatment. Pt verbalizes understanding of information given and will contact nursing with any questions or concerns.  Husband present for teaching.   [email or web address]  Gloriajean Dell. Leonie Green, BSN

## 2021-03-12 ENCOUNTER — Ambulatory Visit
Admission: RE | Admit: 2021-03-12 | Discharge: 2021-03-12 | Disposition: A | Discharge status: Home / Self Care | Payer: Medicare Other | Source: Ambulatory Visit | Attending: Radiation Oncology | Admitting: Radiation Oncology

## 2021-03-12 ENCOUNTER — Ambulatory Visit: Payer: Medicare Other

## 2021-03-12 DIAGNOSIS — C153 Malignant neoplasm of upper third of esophagus: Secondary | ICD-10-CM | POA: Diagnosis not present

## 2021-03-12 DIAGNOSIS — Z510 Encounter for antineoplastic radiation therapy: Secondary | ICD-10-CM | POA: Diagnosis not present

## 2021-03-15 ENCOUNTER — Ambulatory Visit
Admission: RE | Admit: 2021-03-15 | Discharge: 2021-03-15 | Disposition: A | Discharge status: Home / Self Care | Payer: Medicare Other | Source: Ambulatory Visit | Attending: Radiation Oncology | Admitting: Radiation Oncology

## 2021-03-15 ENCOUNTER — Other Ambulatory Visit: Payer: Self-pay

## 2021-03-15 DIAGNOSIS — C153 Malignant neoplasm of upper third of esophagus: Secondary | ICD-10-CM | POA: Diagnosis not present

## 2021-03-15 DIAGNOSIS — Z9889 Other specified postprocedural states: Secondary | ICD-10-CM | POA: Insufficient documentation

## 2021-03-15 DIAGNOSIS — Z510 Encounter for antineoplastic radiation therapy: Secondary | ICD-10-CM | POA: Diagnosis not present

## 2021-03-15 MED FILL — Dexamethasone Sodium Phosphate Inj 100 MG/10ML: INTRAMUSCULAR | Qty: 1 | Status: AC

## 2021-03-15 NOTE — Progress Notes (Signed)
[name]   [phone] Fax:([phone]) [phone]   Clinic Follow up Note   Patient Care Team: [name], [name], MD as PCP - General [name], MD as PCP - Cardiology (Cardiology) Cindie Crumbly (Neurosurgery) Irine Seal, MD as Attending Physician (Urology) Erline Levine, MD as Consulting Physician (Neurosurgery) [name], MD as Consulting Physician (Neurology) Viona Gilmore, [facility] as Pharmacist (Pharmacist) [name], MD as Consulting Physician (Gastroenterology) Carol Ada, MD as Consulting Physician (Gastroenterology) [name], MD as Consulting Physician (Otolaryngology) [name], MD as Consulting Physician (Hematology) Kyung Rudd, MD as Consulting Physician (Radiation Oncology) 03/16/2021  CHIEF COMPLAINT: Follow up esophagus cancer   SUMMARY OF ONCOLOGIC HISTORY: Oncology History  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  The base of tongue vallecula and epiglottis were normal.    Piriform sinuses were clear bilaterally and vocal cords were clear with  normal vocal mobility.  No structural abnormalities noted.   01/22/2021 Imaging   DG esophagus IMPRESSION: 1. Luminal narrowing in the high cervical esophagus over a 3 cm segment is highly concerning for esophageal neoplasm. Inflammatory process would be a secondary consideration. Luminal narrowing occurs approximately at the C5-C7 vertebral body level just below the glottis. Patient experienced several episodes of choking related to this luminal narrowing. Recommend expedient upper GI endoscopy for evaluation. 2. Distal thoracic esophagus and GE junction appear normal.   02/04/2021 Procedure   EGD by Dr. Carlean Purl impression- One mass-like severe stenosis was found in the upper third of the esophagus. The stenosis was not  traversed.   02/05/2021 Imaging   CT soft tissue neck w contrast IMPRESSION: Mass-like soft tissue thickening and mucosal hyperenhancement of the cervical esophagus and hypopharynx, spanning the C4-T1 levels, likely reflecting an esophageal malignancy. This measures up to 2.4 x 3.4 cm in transaxial dimensions, and 6.3 cm in craniocaudal dimension. Associated severe effacement of the esophageal lumen.   Centrally necrotic right paratracheal lymph node beneath the level of the mass, measuring 1.3 x 0.9 cm, and likely reflecting a site of nodal metastatic disease.   Additional lymph nodes along the posterior and inferior aspect of the right thyroid lobe (along the right aspect of the esophageal mass), which measure subcentimeter but are asymmetrically prominent and highly suspicious for additional sites of nodal metastatic disease.   02/12/2021 Pathology Results   FINAL MICROSCOPIC DIAGNOSIS:  A. ESOPHAGUS, UPPER CERVICAL #1, BIOPSY:  - Squamous cell carcinoma.  B. ESOPHAGUS, UPPER CERVICAL #2, BIOPSY:  - Squamous cell carcinoma.    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   Markedly hypermetabolic right upper paratracheal lymph node, compatible with metastatic disease.   Small right upper paratracheal and right level IIA lymph nodes with mild hypermetabolic activity, concerning for additional sites of nodal metastatic disease.   Aortic Atherosclerosis (ICD10-I70.0).   03/09/2021 -  Chemotherapy   Patient is on Treatment Plan : ESOPHAGUS  Carboplatin/PACLitaxel weekly x 6 weeks with XRT  CURRENT THERAPY: concurrent chemoRT with carbo/taxol, RT started 03/08/21, chemo to start 03/09/21   INTERVAL HISTORY: [name] returns for follow up and treatment as scheduled. She was last seen 03/09/21 for C1, she began week 2 RT 03/15/21.  She is doing well in general, chemo was "more mild" than she thought.  Energy has improved since starting treatment.  She also notes she can swallow yogurt with meds down the middle now rather than just on the right side.  She is relying mostly on the G-tube, does feedings 5 times per day every 3 hours +60 mL of water before and after.  She had some drainage from the the tube site, saw Dr. Harlow Asa yesterday who adjusted it and prescribed prophylactic doxycycline.  She denies pain, fever, chills.  Takes Compazine to avoid nausea, bowels moving normally.  Denies cough, chest pain, dyspnea, neuropathy in her fingertips.  She does note her feet feel numb intermittently, present prior to chemo.  She has become very anxious before radiation treatments and is requesting medication.    MEDICAL HISTORY:  Past Medical History:  Diagnosis Date   Abdominal pain 05/29/2013   s/p rx of cephalo resistant e coli   but last rx NG  now residular ?  bladder sx repeat cx sx rx to ty and uro consult    ADJ DISORDER WITH MIXED ANXIETY & DEPRESSED MOOD 03/03/2010   Qualifier: Diagnosis of  By: [name], [name]    Agent resistant to multiple antibiotics 05/29/2013   e coli   bu NG on fu.     Anemia    Anxiety    ARF (acute renal failure) ([place]) 03/12/2015   Cancer ([place])    Closed head injury 02/01/2011   from syncope and had scalp laceration  neg ct .     Closed head injury 5-6 yrs ago   Colitis 37/16/9678   Complication of anesthesia    migraine several hours after general anesthesia   Depression    Fatty liver    Gall stones 2016   see ct scan neg HIDA    GERD (gastroesophageal reflux disease)    Hearing  aid worn    HOH (hard of hearing)    both ears   Hyperlipidemia    Hypertension    echo nl lv function  mild dilitation 2009   Kidney infection    few yrs ago in hospital   Medication side effect 09/02/2010   Poss muscle se of 10 crestor    Migraine    hypnic HA eval by Dr. 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    Pyelonephritis 03/12/2015   Sensation of pain in anesthetized distribution of trigeminal nerve    Syncope 02/01/2011   In shower on vacation  sustained head laceration  8 sutures Had ed visit neg head ct labs and x ray    Trigeminal neuralgia pain     SURGICAL HISTORY: Past Surgical History:  Procedure Laterality Date   ABDOMINAL HYSTERECTOMY  2002   tubal   BACK SURGERY     2 times, for sciatic nerve pain   CARDIAC CATHETERIZATION  2000   chest pains neg   CHOLECYSTECTOMY N/A 02/21/2017   Procedure: LAPAROSCOPIC CHOLECYSTECTOMY WITH INTRAOPERATIVE CHOLANGIOGRAM;  Surgeon: Armandina Gemma, MD;  Location: [facility];  Service: General;  Laterality: N/A;   COLONOSCOPY     multiple   CRANIOTOMY  12/09/2011   nerve decompression right  trigeminal    DIRECT LARYNGOSCOPY N/A 02/12/2021   Procedure: DIRECT LARYNGOSCOPY AND BIOPSY POSSIBLE FROZEN;  Surgeon: [name], MD;  Location: [place];  Service: ENT;  Laterality: N/A;   DOPPLER ECHOCARDIOGRAPHY  2009   nl lv function mild lv dilitation   EYE SURGERY Bilateral    ioc for catatracts   GASTROSTOMY N/A 02/26/2021   Procedure: OPEN GASTROSTOMY TUBE PLACEMENT;  Surgeon: Armandina Gemma, MD;  Location: [facility];  Service: General;  Laterality: N/A;   IR GASTROSTOMY TUBE MOD SED  02/22/2021   laparoscopic gallbladder surgery  02/16/2017   Fax from [place] Bilateral 2002   rt shoulder surgery      I have reviewed the social history and family history with the patient and they are unchanged from previous note.  ALLERGIES:  is allergic to  hydrocodone and sulfamethoxazole-trimethoprim.  MEDICATIONS:  Current Outpatient Medications  Medication Sig Dispense Refill   LORazepam (ATIVAN) 0.5 MG tablet Take 1 tablet by mouth as needed 30-60 minutes before radiation for anxiety 30 tablet 0   acetaminophen (TYLENOL) 325 MG tablet Take 650 mg by mouth every 6 (six) hours as needed for moderate pain.     alendronate (FOSAMAX) 70 MG tablet TAKE 1 TABLET BY MOUTH 1 TIME A WEEK WITH FULL GLASS OF WATER AND ON AN EMPTY STOMACH AS DIRECTED 4 tablet 2   benazepril (LOTENSIN) 20 MG tablet Take 1 tablet (20 mg total) by mouth 2 (two) times daily. 180 tablet 1   Calcium Carb-Cholecalciferol (CALCIUM 600+D) 600-800 MG-UNIT TABS Take 1 tablet by mouth 2 (two) times daily.     carvedilol (COREG) 25 MG tablet TAKE 1 TABLET(25 MG) BY MOUTH TWICE DAILY WITH A MEAL 60 tablet 3   conjugated estrogens (PREMARIN) vaginal cream Place 1 applicator vaginally 2 (two) times a week.     doxycycline (VIBRAMYCIN) 100 MG capsule Take 100 mg by mouth 2 (two) times daily.     fluticasone (FLONASE) 50 MCG/ACT nasal spray Place 2 sprays into both nostrils as needed for allergies or rhinitis. 16 g 5   furosemide (LASIX) 40 MG tablet Take 1 tablet (40 mg total) by mouth daily. 30 tablet 3   lamotrigine (LAMICTAL) 25 MG disintegrating tablet Dissolve 2 tablets (50 mg) under tongue twice daily along with 100 mg for total dose of 150 mg twice daily. 120 tablet 3   lamoTRIgine 100 MG TBDP Dissolve 1 tablet (100 mg) under tongue twice daily along with 50 mg for total dose of 150 mg twice daily. 60 tablet 3   Multiple Vitamin (MULTIVITAMIN ADULT PO) Take 1 tablet by mouth daily.     omeprazole (PRILOSEC) 20 MG capsule Take 1 capsule (20 mg total) by mouth daily. (Patient taking differently: Take 20 mg by mouth daily as needed (acid reflux).) 30 capsule 2   ondansetron (ZOFRAN) 4 MG tablet Take 1 tablet (4 mg total) by mouth daily as needed for nausea or vomiting. 30 tablet 1    ondansetron (ZOFRAN) 8 MG tablet Take 1 tablet (8 mg total) by mouth 2 (two) times daily as needed for refractory nausea / vomiting. Start on day 3 after chemo. 30 tablet 1   ondansetron (ZOFRAN-ODT) 4 MG disintegrating tablet Take 1-2 tablets (4-8 mg total) by mouth every 8 (eight) hours as needed for nausea. 60 tablet 3   OXcarbazepine (TRILEPTAL) 300 MG/5ML suspension Take 2.5 mLs (150 mg total) by mouth 2 (two) times daily. [place]  mL 3   Polyethyl Glycol-Propyl Glycol (SYSTANE) 0.4-0.3 % GEL ophthalmic gel Place 1 application into both eyes 2 (two) times daily as needed (dry eyes).     prochlorperazine (COMPAZINE) 10 MG tablet Take 1 tablet (10 mg total) by mouth every 6 (six) hours as needed (Nausea or vomiting). 30 tablet 1   No current facility-administered medications for this visit.   Facility-Administered Medications Ordered in Other Visits  Medication Dose Route Frequency Provider Last Rate Last Admin   0.9 %  sodium chloride infusion   Intravenous Once [name], MD       CARBOplatin (PARAPLATIN) 130 mg in sodium chloride 0.9 % 100 mL chemo infusion  130 mg Intravenous Once [name], MD       dexamethasone (DECADRON) 10 mg in sodium chloride 0.9 % 50 mL IVPB  10 mg Intravenous Once [name], MD       diphenhydrAMINE (BENADRYL) injection 50 mg  50 mg Intravenous Once [name], MD       famotidine (PEPCID) IVPB 20 mg in NS 100 mL IVPB  20 mg Intravenous Once [name], MD       PACLitaxel (TAXOL) 78 mg in sodium chloride 0.9 % 250 mL chemo infusion (</= 62m/m2)  50 mg/m2 (Treatment Plan Recorded) Intravenous Once FTruitt Merle MD       palonosetron (ALOXI) injection 0.25 mg  0.25 mg Intravenous Once FTruitt Merle MD        PHYSICAL EXAMINATION: ECOG PERFORMANCE STATUS: 1 - Symptomatic but completely ambulatory  Vitals:   03/16/21 0847  BP: 121/67  Pulse: 65  Resp: 17  Temp: 97.8 F (36.6 C)  SpO2: 99%   Filed Weights   03/16/21 0847  Weight: 125 lb 9.6 oz (57 kg)     GENERAL:alert, no distress and comfortable SKIN: no rash EYES: sclera clear LUNGS:  with normal breathing effort HEART: trace pedal edema with mottling at the toes ABDOMEN: abdomen soft, non-tender and normal bowel sounds. Left G tube secured in place, small amount serous drainage on the dressing NEURO: alert & oriented x 3 with fluent speech, no focal motor/sensory deficits  LABORATORY DATA:  I have reviewed the data as listed CBC Latest Ref Rng & Units 03/16/2021 03/09/2021 03/02/2021  WBC 4.0 - 10.5 K/uL 3.8(L) 7.7 7.1  Hemoglobin 12.0 - 15.0 g/dL 12.1 13.0 13.1  Hematocrit 36.0 - 46.0 % 36.3 40.7 39.3  Platelets 150 - 400 K/uL 247 257 233     CMP Latest Ref Rng & Units 03/16/2021 03/09/2021 03/02/2021  Glucose 70 - 99 mg/dL 104(H) 202(H) 83  BUN 8 - 23 mg/dL 27(H) 29(H) 20  Creatinine 0.44 - 1.00 mg/dL 0.75 0.83 0.79  Sodium 135 - 145 mmol/L 139 140 140  Potassium 3.5 - 5.1 mmol/L 4.3 4.1 4.2  Chloride 98 - 111 mmol/L 101 102 102  CO2 22 - 32 mmol/L 30 29 30   Calcium 8.9 - 10.3 mg/dL 9.9 10.0 9.9  Total Protein 6.5 - 8.1 g/dL 6.3(L) 6.6 6.7  Total Bilirubin 0.3 - 1.2 mg/dL 0.5 0.5 0.6  Alkaline Phos 38 - 126 U/L 78 81 86  AST 15 - 41 U/L 16 19 21   ALT 0 - 44 U/L 17 17 15       RADIOGRAPHIC STUDIES: I have personally reviewed the radiological images as listed and agreed with the findings in the report. No results found.   ASSESSMENT & PLAN: 80yo female    1.Squamous cell carcinoma of cervical esophagus  -  She developed dysphagia and weight loss, work up showed 3x6 cm mass in the cervical/upper esophagus with possible local adenopathy. Path confirmed squamous cell carcinoma of the esophagus  -She had feeding tube placed by Dr. Harlow Asa 02/26/21  -PET scan shows hypermetabolic esophageal mass and hypermetabolic paratracheal nodes, negative to distant metastasis. This is local disease  -despite local disease, due to the location in the cervical esophagus, Dr. Kipp Brood  feels she is not a surgical candidate. Her case was reviewed in tumor board  -she was seen by rad once, the consensus is chemoRT with carbo/taxol. She consented and attended chemo education teaching. Goal is curative -she has mild abnormal nerve sensation in the feet, no previous diagnosis of neuropathy. Will monitor closely on taxol.  -PDL1 testing is 95% positive, predicting a good response to immunotherapy which we plan to give after chemoRT for up to 2 years. Patient is interested. She may want a port a cath at some point, will discuss another day. -she began RT 03/08/21 and carbo/taxol 03/09/21. Tolerating well thus far -F/up weekly on chemoRT   2.Dysphagia, weight loss  -Secondary to #1 -She has not tolerated solids lately, only pureed or liquid diet with difficulty. Can change meds to liquid as applicable  -She is a candidate for feeding tube, IR could not access the stomach. She underwent surgical placement by Dr. Harlow Asa on 02/26/21 -tolerating 5 bolus feeds per day + 120 ml water q3 hours -no signs of refeeding syndrome. F/up with dietician  -weight is increasing -On prophylactic doxycycline per Dr. Harlow Asa for mild drainage (since 03/12/21); no current signs of infection  3.Trigeminal neuralgia, sciatica, headaches -On lamotrigene and oxcarbazepine  -pain has worsened lately, I reached out to Dr. Jaynee Eagles last week who had recently adjusted meds   4.HTN -continue benazepril, furosemide - I reduced benazepril to once daily from 03/09/21, and let her know to hold lasix if she becomes dizzy at home   -monitoring closely    5.Goals of Care -most recent code status is DNR from hospitalization 05/16/20, we did not discuss this at today's consult  -goal of chemoRT is curative   Disposition: Ms. Apsey appears stable.  She completed week 1 chemo RT with carbo/Taxol.  She tolerated well without significant side effects.  She is doing well with tube feeds, gaining weight.  Side effects  are well managed with supportive care at home.  She is able to recover and function well.  There is no clinical evidence of disease progression.  Labs reviewed, adequate to proceed with cycle/week 2 carbo/Taxol today and continue RT.  I prescribed low-dose Ativan for anxiety with radiation treatments.  We reviewed the policy, she knows not to drive on medication and avoid taking with pain medicine, she has not been prescribed opiates.  Follow-up weekly on chemo RT.  All questions were answered. The patient knows to call the clinic with any problems, questions or concerns. No barriers to learning were detected.     [name], NP 03/16/21

## 2021-03-16 ENCOUNTER — Inpatient Hospital Stay: Payer: Medicare Other

## 2021-03-16 ENCOUNTER — Telehealth: Payer: Self-pay

## 2021-03-16 ENCOUNTER — Encounter: Payer: Self-pay | Admitting: Nurse Practitioner

## 2021-03-16 ENCOUNTER — Inpatient Hospital Stay: Payer: Medicare Other | Admitting: Dietician

## 2021-03-16 ENCOUNTER — Ambulatory Visit
Admission: RE | Admit: 2021-03-16 | Discharge: 2021-03-16 | Disposition: A | Discharge status: Home / Self Care | Payer: Medicare Other | Source: Ambulatory Visit | Attending: Radiation Oncology | Admitting: Radiation Oncology

## 2021-03-16 ENCOUNTER — Inpatient Hospital Stay: Payer: Medicare Other | Admitting: Nurse Practitioner

## 2021-03-16 VITALS — BP 121/67 | HR 65 | Temp 97.8°F | Resp 17 | Ht 66.0 in | Wt 125.6 lb

## 2021-03-16 DIAGNOSIS — R634 Abnormal weight loss: Secondary | ICD-10-CM | POA: Diagnosis not present

## 2021-03-16 DIAGNOSIS — R1310 Dysphagia, unspecified: Secondary | ICD-10-CM | POA: Diagnosis not present

## 2021-03-16 DIAGNOSIS — C153 Malignant neoplasm of upper third of esophagus: Secondary | ICD-10-CM | POA: Diagnosis not present

## 2021-03-16 DIAGNOSIS — I10 Essential (primary) hypertension: Secondary | ICD-10-CM | POA: Diagnosis not present

## 2021-03-16 DIAGNOSIS — M792 Neuralgia and neuritis, unspecified: Secondary | ICD-10-CM | POA: Diagnosis not present

## 2021-03-16 DIAGNOSIS — Z5111 Encounter for antineoplastic chemotherapy: Secondary | ICD-10-CM | POA: Diagnosis not present

## 2021-03-16 DIAGNOSIS — Z510 Encounter for antineoplastic radiation therapy: Secondary | ICD-10-CM | POA: Diagnosis not present

## 2021-03-16 LAB — CMP (CANCER CENTER ONLY)
ALT: 17 U/L (ref 0–44)
AST: 16 U/L (ref 15–41)
Albumin: 3.3 g/dL — ABNORMAL LOW (ref 3.5–5.0)
Alkaline Phosphatase: 78 U/L (ref 38–126)
Anion gap: 8 (ref 5–15)
BUN: 27 mg/dL — ABNORMAL HIGH (ref 8–23)
CO2: 30 mmol/L (ref 22–32)
Calcium: 9.9 mg/dL (ref 8.9–10.3)
Chloride: 101 mmol/L (ref 98–111)
Creatinine: 0.75 mg/dL (ref 0.44–1.00)
GFR, Estimated: >60 mL/min (ref >60)
Glucose, Bld: 104 mg/dL — ABNORMAL HIGH (ref 70–99)
Potassium: 4.3 mmol/L (ref 3.5–5.1)
Sodium: 139 mmol/L (ref 135–145)
Total Bilirubin: 0.5 mg/dL (ref 0.3–1.2)
Total Protein: 6.3 g/dL — ABNORMAL LOW (ref 6.5–8.1)

## 2021-03-16 LAB — CBC WITH DIFFERENTIAL (CANCER CENTER ONLY)
Abs Immature Granulocytes: 0.05 10*3/uL (ref 0.00–0.07)
Basophils Absolute: 0 10*3/uL (ref 0.0–0.1)
Basophils Relative: 1 %
Eosinophils Absolute: 0.1 10*3/uL (ref 0.0–0.5)
Eosinophils Relative: 4 %
HCT: 36.3 % (ref 36.0–46.0)
Hemoglobin: 12.1 g/dL (ref 12.0–15.0)
Immature Granulocytes: 1 %
Lymphocytes Relative: 14 %
Lymphs Abs: 0.5 10*3/uL — ABNORMAL LOW (ref 0.7–4.0)
MCH: 29.8 pg (ref 26.0–34.0)
MCHC: 33.3 g/dL (ref 30.0–36.0)
MCV: 89.4 fL (ref 80.0–100.0)
Monocytes Absolute: 0.3 10*3/uL (ref 0.1–1.0)
Monocytes Relative: 7 %
Neutro Abs: 2.8 10*3/uL (ref 1.7–7.7)
Neutrophils Relative %: 73 %
Platelet Count: 247 10*3/uL (ref 150–400)
RBC: 4.06 MIL/uL (ref 3.87–5.11)
RDW: 13.2 % (ref 11.5–15.5)
WBC Count: 3.8 10*3/uL — ABNORMAL LOW (ref 4.0–10.5)
nRBC: 0 % (ref 0.0–0.2)

## 2021-03-16 MED ORDER — PALONOSETRON HCL INJECTION 0.25 MG/5ML
0.2500 mg | Freq: Once | INTRAVENOUS | Status: AC
  Administered 2021-03-16: 0.25 mg via INTRAVENOUS
  Filled 2021-03-16: qty 5

## 2021-03-16 MED ORDER — SODIUM CHLORIDE 0.9 % IV SOLN
50.0000 mg/m2 | Freq: Once | INTRAVENOUS | Status: AC
  Administered 2021-03-16: 78 mg via INTRAVENOUS
  Filled 2021-03-16: qty 13

## 2021-03-16 MED ORDER — SODIUM CHLORIDE 0.9 % IV SOLN
10.0000 mg | Freq: Once | INTRAVENOUS | Status: AC
  Administered 2021-03-16: 10 mg via INTRAVENOUS
  Filled 2021-03-16: qty 10

## 2021-03-16 MED ORDER — FAMOTIDINE 20 MG IN NS 100 ML IVPB
20.0000 mg | Freq: Once | INTRAVENOUS | Status: AC
  Administered 2021-03-16: 20 mg via INTRAVENOUS
  Filled 2021-03-16: qty 100

## 2021-03-16 MED ORDER — DIPHENHYDRAMINE HCL 50 MG/ML IJ SOLN
50.0000 mg | Freq: Once | INTRAMUSCULAR | Status: AC
  Administered 2021-03-16: 50 mg via INTRAVENOUS
  Filled 2021-03-16: qty 1

## 2021-03-16 MED ORDER — SODIUM CHLORIDE 0.9 % IV SOLN: Freq: Once | INTRAVENOUS | Status: AC

## 2021-03-16 MED ORDER — SODIUM CHLORIDE 0.9 % IV SOLN
127.4000 mg | Freq: Once | INTRAVENOUS | Status: AC
  Administered 2021-03-16: 130 mg via INTRAVENOUS
  Filled 2021-03-16: qty 13

## 2021-03-16 MED ORDER — LORAZEPAM 0.5 MG PO TABS: ORAL_TABLET | ORAL | 0 refills | Status: DC

## 2021-03-16 NOTE — Progress Notes (Addendum)
Nutrition Follow-up:  Patient with esophageal cancer. She is receiving concurrent chemoradiation therapy with weekly carbo/taxol. S/p open G-tube on 10/7.  Noted patient seen by Dr. Harlow Asa 10/24 secondary to leakage around tube. Tube was repositioned in office.   Met with patient during infusion. She reports mild nausea for a couple days after first chemotherapy. Patient reports compazine is working well for her. Patient is eating few bites of yogurt to take her mediations, drinking half of a Boost each morning and evening, and taking sips of water throughout day. She is tolerating tube feedings well, reports 1 carton Osmolite 1.5 every 3 hours with 60 ml water flush before and after each feeding. Patient is glad that she has gained a few pounds.   Medications: reviewed  Labs: Glucose 104, BUN 27  Anthropometrics: Weight 125 lb 9.6 oz today increased from 123 lb 3.2 oz on 10/18 and 117 lb 15.1 oz on 10/7.  Estimated Energy Needs  Kcals: 1410-3013 Protein: 72-88 Fluid: 1.7 L  NUTRITION DIAGNOSIS: Inadequate oral intake ongoing, patient relying on tube feedings  INTERVENTION:  Encouraged soft, smooth foods as tolerated Continue 1 carton Osmolite 1.5 via tube 5x/day. Flush tube with 60 ml water before and after each feeding. Drink by mouth or give via tube additional 1 1/2 cups fluids/day. Provides 1775 kcal, 74.5 grams protein, 1865 ml water.  Patient has contact information    MONITORING, EVALUATION, GOAL: weight trends, intake, tube feedings   NEXT VISIT: Monday October 31 during infusion with Desmond Lope

## 2021-03-16 NOTE — Telephone Encounter (Signed)
Patient notified of Prior Authorization approval for Lorazepam 0.61m Tablets. Medication is approved through 03/16/2022

## 2021-03-16 NOTE — Patient Instructions (Signed)
[facility]   Discharge Instructions: Thank you for choosing [facility] to provide your oncology and hematology care.   If you have a lab appointment with the Cancer Center, please go directly to the Cancer Center and check in at the registration area.   Wear comfortable clothing and clothing appropriate for easy access to any Portacath or PICC line.   We strive to give you quality time with your provider. You may need to reschedule your appointment if you arrive late (15 or more minutes).  Arriving late affects you and other patients whose appointments are after yours.  Also, if you miss three or more appointments without notifying the office, you may be dismissed from the clinic at the provider's discretion.      For prescription refill requests, have your pharmacy contact our office and allow 72 hours for refills to be completed.    Today you received the following chemotherapy and/or immunotherapy agents: paclitaxel and carboplatin.      To help prevent nausea and vomiting after your treatment, we encourage you to take your nausea medication as directed.  BELOW ARE SYMPTOMS THAT SHOULD BE REPORTED IMMEDIATELY: *FEVER GREATER THAN 100.4 F (38 C) OR HIGHER *CHILLS OR SWEATING *NAUSEA AND VOMITING THAT IS NOT CONTROLLED WITH YOUR NAUSEA MEDICATION *UNUSUAL SHORTNESS OF BREATH *UNUSUAL BRUISING OR BLEEDING *URINARY PROBLEMS (pain or burning when urinating, or frequent urination) *BOWEL PROBLEMS (unusual diarrhea, constipation, pain near the anus) TENDERNESS IN MOUTH AND THROAT WITH OR WITHOUT PRESENCE OF ULCERS (sore throat, sores in mouth, or a toothache) UNUSUAL RASH, SWELLING OR PAIN  UNUSUAL VAGINAL DISCHARGE OR ITCHING   Items with * indicate a potential emergency and should be followed up as soon as possible or go to the Emergency Department if any problems should occur.  Please show the CHEMOTHERAPY ALERT CARD or IMMUNOTHERAPY ALERT  CARD at check-in to the Emergency Department and triage nurse.  Should you have questions after your visit or need to cancel or reschedule your appointment, please contact [facility]  Dept: [phone]  and follow the prompts.  Office hours are 8:00 a.m. to 4:30 p.m. Monday - Friday. Please note that voicemails left after 4:00 p.m. may not be returned until the following business day.  We are closed weekends and major holidays. You have access to a nurse at all times for urgent questions. Please call the main number to the clinic Dept: [phone] and follow the prompts.   For any non-urgent questions, you may also contact your provider using MyChart. We now offer e-Visits for anyone 18 and older to request care online for non-urgent symptoms. For details visit [email or web address].   Also download the MyChart app! Go to the app store, search "MyChart", open the app, select [facility], and log in with your MyChart username and password.  Due to Covid, a mask is required upon entering the hospital/clinic. If you do not have a mask, one will be given to you upon arrival. For doctor visits, patients may have 1 support person aged 18 or older with them. For treatment visits, patients cannot have anyone with them due to current Covid guidelines and our immunocompromised population.   Paclitaxel injection What is this medication? PACLITAXEL (PAK li TAX el) is a chemotherapy drug. It targets fast dividing cells, like cancer cells, and causes these cells to die. This medicine is used to treat ovarian cancer, breast cancer, lung cancer, Kaposi's sarcoma, and other cancers.   This medicine may be used for other purposes; ask your health care provider or pharmacist if you have questions. COMMON BRAND NAME(S): Onxol, Taxol What should I tell my care team before I take this medication? They need to know if you have any of these conditions: history of irregular heartbeat liver  disease low blood counts, like low white cell, platelet, or red cell counts lung or breathing disease, like asthma tingling of the fingers or toes, or other nerve disorder an unusual or allergic reaction to paclitaxel, alcohol, polyoxyethylated castor oil, other chemotherapy, other medicines, foods, dyes, or preservatives pregnant or trying to get pregnant breast-feeding How should I use this medication? This drug is given as an infusion into a vein. It is administered in a hospital or clinic by a specially trained health care professional. Talk to your pediatrician regarding the use of this medicine in children. Special care may be needed. Overdosage: If you think you have taken too much of this medicine contact a poison control center or emergency room at once. NOTE: This medicine is only for you. Do not share this medicine with others. What if I miss a dose? It is important not to miss your dose. Call your doctor or health care professional if you are unable to keep an appointment. What may interact with this medication? Do not take this medicine with any of the following medications: live virus vaccines This medicine may also interact with the following medications: antiviral medicines for hepatitis, HIV or AIDS certain antibiotics like erythromycin and clarithromycin certain medicines for fungal infections like ketoconazole and itraconazole certain medicines for seizures like carbamazepine, phenobarbital, phenytoin gemfibrozil nefazodone rifampin St. John's wort This list may not describe all possible interactions. Give your health care provider a list of all the medicines, herbs, non-prescription drugs, or dietary supplements you use. Also tell them if you smoke, drink alcohol, or use illegal drugs. Some items may interact with your medicine. What should I watch for while using this medication? Your condition will be monitored carefully while you are receiving this medicine. You  will need important blood work done while you are taking this medicine. This medicine can cause serious allergic reactions. To reduce your risk you will need to take other medicine(s) before treatment with this medicine. If you experience allergic reactions like skin rash, itching or hives, swelling of the face, lips, or tongue, tell your doctor or health care professional right away. In some cases, you may be given additional medicines to help with side effects. Follow all directions for their use. This drug may make you feel generally unwell. This is not uncommon, as chemotherapy can affect healthy cells as well as cancer cells. Report any side effects. Continue your course of treatment even though you feel ill unless your doctor tells you to stop. Call your doctor or health care professional for advice if you get a fever, chills or sore throat, or other symptoms of a cold or flu. Do not treat yourself. This drug decreases your body's ability to fight infections. Try to avoid being around people who are sick. This medicine may increase your risk to bruise or bleed. Call your doctor or health care professional if you notice any unusual bleeding. Be careful brushing and flossing your teeth or using a toothpick because you may get an infection or bleed more easily. If you have any dental work done, tell your dentist you are receiving this medicine. Avoid taking products that contain aspirin, acetaminophen, ibuprofen, naproxen, or ketoprofen unless   instructed by your doctor. These medicines may hide a fever. Do not become pregnant while taking this medicine. Women should inform their doctor if they wish to become pregnant or think they might be pregnant. There is a potential for serious side effects to an unborn child. Talk to your health care professional or pharmacist for more information. Do not breast-feed an infant while taking this medicine. Men are advised not to father a child while receiving this  medicine. This product may contain alcohol. Ask your pharmacist or healthcare provider if this medicine contains alcohol. Be sure to tell all healthcare providers you are taking this medicine. Certain medicines, like metronidazole and disulfiram, can cause an unpleasant reaction when taken with alcohol. The reaction includes flushing, headache, nausea, vomiting, sweating, and increased thirst. The reaction can last from 30 minutes to several hours. What side effects may I notice from receiving this medication? Side effects that you should report to your doctor or health care professional as soon as possible: allergic reactions like skin rash, itching or hives, swelling of the face, lips, or tongue breathing problems changes in vision fast, irregular heartbeat high or low blood pressure mouth sores pain, tingling, numbness in the hands or feet signs of decreased platelets or bleeding - bruising, pinpoint red spots on the skin, black, tarry stools, blood in the urine signs of decreased red blood cells - unusually weak or tired, feeling faint or lightheaded, falls signs of infection - fever or chills, cough, sore throat, pain or difficulty passing urine signs and symptoms of liver injury like dark yellow or brown urine; general ill feeling or flu-like symptoms; light-colored stools; loss of appetite; nausea; right upper belly pain; unusually weak or tired; yellowing of the eyes or skin swelling of the ankles, feet, hands unusually slow heartbeat Side effects that usually do not require medical attention (report to your doctor or health care professional if they continue or are bothersome): diarrhea hair loss loss of appetite muscle or joint pain nausea, vomiting pain, redness, or irritation at site where injected tiredness This list may not describe all possible side effects. Call your doctor for medical advice about side effects. You may report side effects to FDA at 1-800-FDA-1088. Where  should I keep my medication? This drug is given in a hospital or clinic and will not be stored at home. NOTE: This sheet is a summary. It may not cover all possible information. If you have questions about this medicine, talk to your doctor, pharmacist, or health care provider.  2022 Elsevier/Gold Standard (2019-04-10 13:37:23)  Carboplatin injection What is this medication? CARBOPLATIN (KAR boe pla tin) is a chemotherapy drug. It targets fast dividing cells, like cancer cells, and causes these cells to die. This medicine is used to treat ovarian cancer and many other cancers. This medicine may be used for other purposes; ask your health care provider or pharmacist if you have questions. COMMON BRAND NAME(S): Paraplatin What should I tell my care team before I take this medication? They need to know if you have any of these conditions: blood disorders hearing problems kidney disease recent or ongoing radiation therapy an unusual or allergic reaction to carboplatin, cisplatin, other chemotherapy, other medicines, foods, dyes, or preservatives pregnant or trying to get pregnant breast-feeding How should I use this medication? This drug is usually given as an infusion into a vein. It is administered in a hospital or clinic by a specially trained health care professional. Talk to your pediatrician regarding the use of this   medicine in children. Special care may be needed. Overdosage: If you think you have taken too much of this medicine contact a poison control center or emergency room at once. NOTE: This medicine is only for you. Do not share this medicine with others. What if I miss a dose? It is important not to miss a dose. Call your doctor or health care professional if you are unable to keep an appointment. What may interact with this medication? medicines for seizures medicines to increase blood counts like filgrastim, pegfilgrastim, sargramostim some antibiotics like amikacin,  gentamicin, neomycin, streptomycin, tobramycin vaccines Talk to your doctor or health care professional before taking any of these medicines: acetaminophen aspirin ibuprofen ketoprofen naproxen This list may not describe all possible interactions. Give your health care provider a list of all the medicines, herbs, non-prescription drugs, or dietary supplements you use. Also tell them if you smoke, drink alcohol, or use illegal drugs. Some items may interact with your medicine. What should I watch for while using this medication? Your condition will be monitored carefully while you are receiving this medicine. You will need important blood work done while you are taking this medicine. This drug may make you feel generally unwell. This is not uncommon, as chemotherapy can affect healthy cells as well as cancer cells. Report any side effects. Continue your course of treatment even though you feel ill unless your doctor tells you to stop. In some cases, you may be given additional medicines to help with side effects. Follow all directions for their use. Call your doctor or health care professional for advice if you get a fever, chills or sore throat, or other symptoms of a cold or flu. Do not treat yourself. This drug decreases your body's ability to fight infections. Try to avoid being around people who are sick. This medicine may increase your risk to bruise or bleed. Call your doctor or health care professional if you notice any unusual bleeding. Be careful brushing and flossing your teeth or using a toothpick because you may get an infection or bleed more easily. If you have any dental work done, tell your dentist you are receiving this medicine. Avoid taking products that contain aspirin, acetaminophen, ibuprofen, naproxen, or ketoprofen unless instructed by your doctor. These medicines may hide a fever. Do not become pregnant while taking this medicine. Women should inform their doctor if they wish  to become pregnant or think they might be pregnant. There is a potential for serious side effects to an unborn child. Talk to your health care professional or pharmacist for more information. Do not breast-feed an infant while taking this medicine. What side effects may I notice from receiving this medication? Side effects that you should report to your doctor or health care professional as soon as possible: allergic reactions like skin rash, itching or hives, swelling of the face, lips, or tongue signs of infection - fever or chills, cough, sore throat, pain or difficulty passing urine signs of decreased platelets or bleeding - bruising, pinpoint red spots on the skin, black, tarry stools, nosebleeds signs of decreased red blood cells - unusually weak or tired, fainting spells, lightheadedness breathing problems changes in hearing changes in vision chest pain high blood pressure low blood counts - This drug may decrease the number of white blood cells, red blood cells and platelets. You may be at increased risk for infections and bleeding. nausea and vomiting pain, swelling, redness or irritation at the injection site pain, tingling, numbness in the hands   or feet problems with balance, talking, walking trouble passing urine or change in the amount of urine Side effects that usually do not require medical attention (report to your doctor or health care professional if they continue or are bothersome): hair loss loss of appetite metallic taste in the mouth or changes in taste This list may not describe all possible side effects. Call your doctor for medical advice about side effects. You may report side effects to FDA at 1-800-FDA-1088. Where should I keep my medication? This drug is given in a hospital or clinic and will not be stored at home. NOTE: This sheet is a summary. It may not cover all possible information. If you have questions about this medicine, talk to your doctor, pharmacist,  or health care provider.  2022 Elsevier/Gold Standard (2007-08-14 14:38:05)   

## 2021-03-17 ENCOUNTER — Ambulatory Visit
Admission: RE | Admit: 2021-03-17 | Discharge: 2021-03-17 | Disposition: A | Discharge status: Home / Self Care | Payer: Medicare Other | Source: Ambulatory Visit | Attending: Radiation Oncology | Admitting: Radiation Oncology

## 2021-03-17 ENCOUNTER — Other Ambulatory Visit: Payer: Self-pay

## 2021-03-17 DIAGNOSIS — Z510 Encounter for antineoplastic radiation therapy: Secondary | ICD-10-CM | POA: Diagnosis not present

## 2021-03-17 DIAGNOSIS — C153 Malignant neoplasm of upper third of esophagus: Secondary | ICD-10-CM | POA: Diagnosis not present

## 2021-03-18 ENCOUNTER — Ambulatory Visit
Admission: RE | Admit: 2021-03-18 | Discharge: 2021-03-18 | Disposition: A | Discharge status: Home / Self Care | Payer: Medicare Other | Source: Ambulatory Visit | Attending: Radiation Oncology | Admitting: Radiation Oncology

## 2021-03-18 DIAGNOSIS — Z510 Encounter for antineoplastic radiation therapy: Secondary | ICD-10-CM | POA: Diagnosis not present

## 2021-03-18 DIAGNOSIS — C153 Malignant neoplasm of upper third of esophagus: Secondary | ICD-10-CM | POA: Diagnosis not present

## 2021-03-19 ENCOUNTER — Other Ambulatory Visit: Payer: Self-pay

## 2021-03-19 ENCOUNTER — Ambulatory Visit
Admission: RE | Admit: 2021-03-19 | Discharge: 2021-03-19 | Disposition: A | Discharge status: Home / Self Care | Payer: Medicare Other | Source: Ambulatory Visit | Attending: Radiation Oncology | Admitting: Radiation Oncology

## 2021-03-19 DIAGNOSIS — C153 Malignant neoplasm of upper third of esophagus: Secondary | ICD-10-CM | POA: Diagnosis not present

## 2021-03-19 DIAGNOSIS — Z510 Encounter for antineoplastic radiation therapy: Secondary | ICD-10-CM | POA: Diagnosis not present

## 2021-03-19 MED FILL — Dexamethasone Sodium Phosphate Inj 100 MG/10ML: INTRAMUSCULAR | Qty: 1 | Status: AC

## 2021-03-22 ENCOUNTER — Ambulatory Visit
Admission: RE | Admit: 2021-03-22 | Discharge: 2021-03-22 | Disposition: A | Discharge status: Home / Self Care | Payer: Medicare Other | Source: Ambulatory Visit | Attending: Radiation Oncology | Admitting: Radiation Oncology

## 2021-03-22 ENCOUNTER — Inpatient Hospital Stay: Payer: Medicare Other

## 2021-03-22 ENCOUNTER — Other Ambulatory Visit: Payer: Self-pay

## 2021-03-22 ENCOUNTER — Inpatient Hospital Stay (HOSPITAL_BASED_OUTPATIENT_CLINIC_OR_DEPARTMENT_OTHER): Payer: Medicare Other | Admitting: Hematology

## 2021-03-22 ENCOUNTER — Encounter: Payer: Self-pay | Admitting: Hematology

## 2021-03-22 ENCOUNTER — Telehealth: Payer: Self-pay

## 2021-03-22 VITALS — BP 130/69 | HR 66 | Temp 97.9°F | Resp 16 | Ht 66.0 in | Wt 126.3 lb

## 2021-03-22 DIAGNOSIS — Z5111 Encounter for antineoplastic chemotherapy: Secondary | ICD-10-CM | POA: Diagnosis not present

## 2021-03-22 DIAGNOSIS — M792 Neuralgia and neuritis, unspecified: Secondary | ICD-10-CM | POA: Diagnosis not present

## 2021-03-22 DIAGNOSIS — C153 Malignant neoplasm of upper third of esophagus: Secondary | ICD-10-CM | POA: Diagnosis not present

## 2021-03-22 DIAGNOSIS — R634 Abnormal weight loss: Secondary | ICD-10-CM | POA: Diagnosis not present

## 2021-03-22 DIAGNOSIS — I10 Essential (primary) hypertension: Secondary | ICD-10-CM | POA: Diagnosis not present

## 2021-03-22 DIAGNOSIS — R1310 Dysphagia, unspecified: Secondary | ICD-10-CM | POA: Diagnosis not present

## 2021-03-22 DIAGNOSIS — Z510 Encounter for antineoplastic radiation therapy: Secondary | ICD-10-CM | POA: Diagnosis not present

## 2021-03-22 LAB — CMP (CANCER CENTER ONLY)
ALT: 17 U/L (ref 0–44)
AST: 17 U/L (ref 15–41)
Albumin: 3.4 g/dL — ABNORMAL LOW (ref 3.5–5.0)
Alkaline Phosphatase: 72 U/L (ref 38–126)
Anion gap: 6 (ref 5–15)
BUN: 22 mg/dL (ref 8–23)
CO2: 31 mmol/L (ref 22–32)
Calcium: 9.3 mg/dL (ref 8.9–10.3)
Chloride: 102 mmol/L (ref 98–111)
Creatinine: 0.75 mg/dL (ref 0.44–1.00)
GFR, Estimated: >60 mL/min (ref >60)
Glucose, Bld: 120 mg/dL — ABNORMAL HIGH (ref 70–99)
Potassium: 4.4 mmol/L (ref 3.5–5.1)
Sodium: 139 mmol/L (ref 135–145)
Total Bilirubin: 0.7 mg/dL (ref 0.3–1.2)
Total Protein: 6.3 g/dL — ABNORMAL LOW (ref 6.5–8.1)

## 2021-03-22 LAB — CBC WITH DIFFERENTIAL (CANCER CENTER ONLY)
Abs Immature Granulocytes: 0.03 10*3/uL (ref 0.00–0.07)
Basophils Absolute: 0 10*3/uL (ref 0.0–0.1)
Basophils Relative: 1 %
Eosinophils Absolute: 0 10*3/uL (ref 0.0–0.5)
Eosinophils Relative: 1 %
HCT: 34.8 % — ABNORMAL LOW (ref 36.0–46.0)
Hemoglobin: 11.5 g/dL — ABNORMAL LOW (ref 12.0–15.0)
Immature Granulocytes: 1 %
Lymphocytes Relative: 15 %
Lymphs Abs: 0.3 10*3/uL — ABNORMAL LOW (ref 0.7–4.0)
MCH: 29.8 pg (ref 26.0–34.0)
MCHC: 33 g/dL (ref 30.0–36.0)
MCV: 90.2 fL (ref 80.0–100.0)
Monocytes Absolute: 0.1 10*3/uL (ref 0.1–1.0)
Monocytes Relative: 4 %
Neutro Abs: 1.8 10*3/uL (ref 1.7–7.7)
Neutrophils Relative %: 78 %
Platelet Count: 199 10*3/uL (ref 150–400)
RBC: 3.86 MIL/uL — ABNORMAL LOW (ref 3.87–5.11)
RDW: 13.6 % (ref 11.5–15.5)
WBC Count: 2.3 10*3/uL — ABNORMAL LOW (ref 4.0–10.5)
nRBC: 0 % (ref 0.0–0.2)

## 2021-03-22 MED ORDER — FAMOTIDINE 20 MG IN NS 100 ML IVPB
20.0000 mg | Freq: Once | INTRAVENOUS | Status: AC
  Administered 2021-03-22: 20 mg via INTRAVENOUS
  Filled 2021-03-22: qty 100

## 2021-03-22 MED ORDER — DIPHENHYDRAMINE HCL 50 MG/ML IJ SOLN
50.0000 mg | Freq: Once | INTRAMUSCULAR | Status: AC
  Administered 2021-03-22: 50 mg via INTRAVENOUS
  Filled 2021-03-22: qty 1

## 2021-03-22 MED ORDER — SODIUM CHLORIDE 0.9 % IV SOLN
50.0000 mg/m2 | Freq: Once | INTRAVENOUS | Status: AC
  Administered 2021-03-22: 78 mg via INTRAVENOUS
  Filled 2021-03-22: qty 13

## 2021-03-22 MED ORDER — SODIUM CHLORIDE 0.9 % IV SOLN: Freq: Once | INTRAVENOUS | Status: AC

## 2021-03-22 MED ORDER — PALONOSETRON HCL INJECTION 0.25 MG/5ML
0.2500 mg | Freq: Once | INTRAVENOUS | Status: AC
  Administered 2021-03-22: 0.25 mg via INTRAVENOUS
  Filled 2021-03-22: qty 5

## 2021-03-22 MED ORDER — SODIUM CHLORIDE 0.9 % IV SOLN
127.4000 mg | Freq: Once | INTRAVENOUS | Status: AC
  Administered 2021-03-22: 130 mg via INTRAVENOUS
  Filled 2021-03-22: qty 13

## 2021-03-22 MED ORDER — MAGIC MOUTHWASH W/LIDOCAINE: 5.0000 mL | Freq: Four times a day (QID) | ORAL | 1 refills | Status: DC | PRN

## 2021-03-22 MED ORDER — SODIUM CHLORIDE 0.9 % IV SOLN
10.0000 mg | Freq: Once | INTRAVENOUS | Status: AC
  Administered 2021-03-22: 10 mg via INTRAVENOUS
  Filled 2021-03-22: qty 10

## 2021-03-22 NOTE — Progress Notes (Signed)
Called in prescription for Magic Mouthwash to pt's preferred pharmacy [facility]).  Spoke with pharmacist Truddie Coco) gave verbal order for 1:1 equal parts of Lidocaine Viscous, Maalox, Nystatin, and Diphenhydramine 475m bottle w/1refill.  Pt to take [id number].  Rubin verbally confirmed prescription and will contact pt when prescription is ready for pickup.

## 2021-03-22 NOTE — Progress Notes (Signed)
[place]   [phone] [phone]   Clinic Follow up Note   Patient Care Team: [name], [name], MD as PCP - General [name], MD as PCP - Cardiology (Cardiology) Cindie Crumbly (Neurosurgery) Irine Seal, MD as Attending Physician (Urology) Erline Levine, MD as Consulting Physician (Neurosurgery) [name], MD as Consulting Physician (Neurology) Viona Gilmore, [facility] as Pharmacist (Pharmacist) [name], MD as Consulting Physician (Gastroenterology) Carol Ada, MD as Consulting Physician (Gastroenterology) [name], MD as Consulting Physician (Otolaryngology) [name], MD as Consulting Physician (Hematology) Kyung Rudd, MD as Consulting Physician (Radiation Oncology)  Date of Service:  03/22/2021  CHIEF COMPLAINT: f/u of esophageal cancer  CURRENT THERAPY:  Concurrent chemoRT with carbo/taxol, RT started 03/08/21, chemo 03/09/21  ASSESSMENT & PLAN:  [name] is a 80 y.o. 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 Path confirmed squamous cell carcinoma of the esophagus  -She had feeding tube placed by Dr. Harlow Asa 02/26/21  -PET scan shows hypermetabolic esophageal mass and hypermetabolic paratracheal nodes, negative to distant metastasis. This is local disease  -despite local disease, due to the location in the cervical esophagus, Dr. Kipp Brood feels she is not a surgical candidate.  -We recommended chemoRT with carbo/taxol. She is tolerating well  -PDL1 testing is 95% positive, predicting a good response to immunotherapy which we plan to give after chemoRT for up to 1-2 years depends on her response. -she began RT 03/08/21 and carbo/taxol 03/09/21. Tolerating well thus far -F/up weekly on chemoRT   2. Symptom  Management: Dysphagia, weight loss  -Secondary to #1 -She underwent feeding tube placement by Dr. Harlow Asa on 02/26/21 -tolerating 5 bolus feeds per day + 120 ml water q3 hours -no signs of refeeding syndrome. F/up with dietician  -weight is increasing -On prophylactic doxycycline per Dr. Harlow Asa for mild drainage (since 03/12/21); no current signs of infection   3. Trigeminal neuralgia, sciatica, headaches -On lamotrigene and oxcarbazepine  -she has mild abnormal nerve sensation in the feet, no previous diagnosis of neuropathy. Will monitor closely on taxol.    4. HTN -continue benazepril, furosemide -I reduced benazepril to once daily from 03/09/21, and let her know to hold lasix if she becomes dizzy at home   -monitoring closely    5. Goals of Care -most recent code status is DNR from hospitalization 05/16/20 -goal of chemoRT is curative    PLAN: -proceed with week 3 carbo/taxol today -continue daily radiation -lab, f/u, and next carbo/taxol in 1 week   No problem-specific Assessment & Plan notes found for this encounter.   SUMMARY OF ONCOLOGIC HISTORY: Oncology History  Malignant neoplasm of upper third esophagus ([place])  12/21/2020 Imaging   Laryngoscopy Comments:    On fiberoptic laryngoscopy through the right nostril the nasopharynx was clear.  The base of tongue vallecula and epiglottis were normal.    Piriform sinuses were clear bilaterally and vocal cords were clear with  normal vocal mobility.  No structural abnormalities noted.   01/22/2021 Imaging   DG esophagus IMPRESSION: 1. Luminal narrowing in the high cervical esophagus over a 3 cm segment is highly concerning for esophageal neoplasm. Inflammatory process would be a secondary consideration. Luminal narrowing occurs approximately at the C5-C7 vertebral body level just below the glottis. Patient experienced several episodes of choking related to this luminal  narrowing. Recommend expedient upper GI endoscopy for  evaluation. 2. Distal thoracic esophagus and GE junction appear normal.   02/04/2021 Procedure   EGD by Dr. Carlean Purl impression- One mass-like severe stenosis was found in the upper third of the esophagus. The stenosis was not traversed.   02/05/2021 Imaging   CT soft tissue neck w contrast IMPRESSION: Mass-like soft tissue thickening and mucosal hyperenhancement of the cervical esophagus and hypopharynx, spanning the C4-T1 levels, likely reflecting an esophageal malignancy. This measures up to 2.4 x 3.4 cm in transaxial dimensions, and 6.3 cm in craniocaudal dimension. Associated severe effacement of the esophageal lumen.   Centrally necrotic right paratracheal lymph node beneath the level of the mass, measuring 1.3 x 0.9 cm, and likely reflecting a site of nodal metastatic disease.   Additional lymph nodes along the posterior and inferior aspect of the right thyroid lobe (along the right aspect of the esophageal mass), which measure subcentimeter but are asymmetrically prominent and highly suspicious for additional sites of nodal metastatic disease.   02/12/2021 Pathology Results   FINAL MICROSCOPIC DIAGNOSIS:  A. ESOPHAGUS, UPPER CERVICAL #1, BIOPSY:  - Squamous cell carcinoma.  B. ESOPHAGUS, UPPER CERVICAL #2, BIOPSY:  - Squamous cell carcinoma.    02/12/2021 Cancer Staging   Staging form: Esophagus - Squamous Cell Carcinoma, AJCC 8th Edition - Clinical stage from 02/12/2021: Stage Unknown (cTX, cN2, cM0) - Signed by [name], MD on 03/08/2021 Stage prefix: Initial diagnosis    02/18/2021 Initial Diagnosis   Malignant neoplasm of upper third esophagus ([place])   02/23/2021 PET scan   IMPRESSION: Markedly hypermetabolic focal masslike thickening of the cervical esophagus, compatible with primary esophageal malignancy.   Markedly hypermetabolic right upper paratracheal lymph node, compatible with metastatic disease.   Small right upper paratracheal and right level IIA lymph nodes with  mild hypermetabolic activity, concerning for additional sites of nodal metastatic disease.   Aortic Atherosclerosis (ICD10-I70.0).   03/09/2021 -  Chemotherapy   Patient is on Treatment Plan : ESOPHAGUS Carboplatin/PACLitaxel weekly x 6 weeks with XRT          INTERVAL HISTORY:  [name] is here for a follow up of esophageal cancer. She was last seen by NP Lacie on 03/16/21. She presents to the clinic accompanied by her husband. She reports she is ingesting via feeding tube, about 4-5 times a day. She states she can intake liquids by mouth, but this can still hurt. She rates her pain at about 5/10. They report a bit of blood to the tube site. I reassured her that this is of no concern.   All other systems were reviewed with the patient and are negative.  MEDICAL HISTORY:  Past Medical History:  Diagnosis Date   Abdominal pain 05/29/2013   s/p rx of cephalo resistant e coli   but last rx NG  now residular ?  bladder sx repeat cx sx rx to ty and uro consult    ADJ DISORDER WITH MIXED ANXIETY & DEPRESSED MOOD 03/03/2010   Qualifier: Diagnosis of  By: [name], [name]    Agent resistant to multiple antibiotics 05/29/2013   e coli   bu NG on fu.     Anemia    Anxiety    ARF (acute renal failure) ([place]) 03/12/2015   Cancer ([place])    Closed head injury 02/01/2011   from syncope and had scalp laceration  neg ct .     Closed head injury 5-6 yrs ago   Colitis  78/93/8101   Complication of anesthesia    migraine several hours after general anesthesia   Depression    Fatty liver    Gall stones 2016   see ct scan neg HIDA    GERD (gastroesophageal reflux disease)    Hearing aid worn    HOH (hard of hearing)    both ears   Hyperlipidemia    Hypertension    echo nl lv function  mild dilitation 2009   Kidney infection    few yrs ago in hospital   Medication side effect 09/02/2010   Poss muscle se of 10 crestor    Migraine    hypnic HA eval by Dr. 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    Pyelonephritis 03/12/2015   Sensation of pain in anesthetized distribution of trigeminal nerve    Syncope 02/01/2011   In shower on vacation  sustained head laceration  8 sutures Had ed visit neg head ct labs and x ray    Trigeminal neuralgia pain     SURGICAL HISTORY: Past Surgical History:  Procedure Laterality Date   ABDOMINAL HYSTERECTOMY  2002   tubal   BACK SURGERY     2 times, for sciatic nerve pain   CARDIAC CATHETERIZATION  2000   chest pains neg   CHOLECYSTECTOMY N/A 02/21/2017   Procedure: LAPAROSCOPIC CHOLECYSTECTOMY WITH INTRAOPERATIVE CHOLANGIOGRAM;  Surgeon: Armandina Gemma, MD;  Location: [facility];  Service: General;  Laterality: N/A;   COLONOSCOPY     multiple   CRANIOTOMY  12/09/2011   nerve decompression right trigeminal    DIRECT LARYNGOSCOPY N/A 02/12/2021   Procedure: DIRECT LARYNGOSCOPY AND BIOPSY POSSIBLE FROZEN;  Surgeon: [name], MD;  Location: [facility];  Service: ENT;  Laterality: N/A;   DOPPLER ECHOCARDIOGRAPHY  2009   nl lv function mild lv dilitation   EYE SURGERY Bilateral    ioc for catatracts   GASTROSTOMY N/A 02/26/2021   Procedure: OPEN GASTROSTOMY TUBE PLACEMENT;  Surgeon: Armandina Gemma, MD;  Location: [facility];  Service: General;  Laterality: N/A;   IR GASTROSTOMY TUBE MOD SED  02/22/2021   laparoscopic gallbladder surgery  02/16/2017   Fax from [facility] Bilateral 2002   rt shoulder surgery      I have reviewed the social history and family history with the patient and they are unchanged from previous note.  ALLERGIES:  is allergic to hydrocodone and sulfamethoxazole-trimethoprim.  MEDICATIONS:  Current Outpatient Medications  Medication Sig Dispense Refill   acetaminophen (TYLENOL) 325 MG tablet Take 650 mg by mouth every 6 (six) hours as needed for moderate pain.     alendronate (FOSAMAX) 70 MG tablet TAKE 1 TABLET BY MOUTH 1 TIME A  WEEK WITH FULL GLASS OF WATER AND ON AN EMPTY STOMACH AS DIRECTED 4 tablet 2   benazepril (LOTENSIN) 20 MG tablet Take 1 tablet (20 mg total) by mouth 2 (two) times daily. 180 tablet 1   Calcium Carb-Cholecalciferol (CALCIUM 600+D) 600-800 MG-UNIT TABS Take 1 tablet by mouth 2 (two) times daily.     carvedilol (COREG) 25 MG tablet TAKE 1 TABLET(25 MG) BY MOUTH TWICE DAILY WITH A MEAL 60 tablet 3   conjugated estrogens (PREMARIN) vaginal cream Place 1 applicator vaginally 2 (two) times a week.     doxycycline (VIBRAMYCIN) 100 MG capsule Take 100 mg by mouth 2 (two) times daily.     fluticasone (FLONASE)  50 MCG/ACT nasal spray Place 2 sprays into both nostrils as needed for allergies or rhinitis. 16 g 5   furosemide (LASIX) 40 MG tablet Take 1 tablet (40 mg total) by mouth daily. 30 tablet 3   lamotrigine (LAMICTAL) 25 MG disintegrating tablet Dissolve 2 tablets (50 mg) under tongue twice daily along with 100 mg for total dose of 150 mg twice daily. 120 tablet 3   lamoTRIgine 100 MG TBDP Dissolve 1 tablet (100 mg) under tongue twice daily along with 50 mg for total dose of 150 mg twice daily. 60 tablet 3   LORazepam (ATIVAN) 0.5 MG tablet Take 1 tablet by mouth as needed 30-60 minutes before radiation for anxiety 30 tablet 0   Multiple Vitamin (MULTIVITAMIN ADULT PO) Take 1 tablet by mouth daily.     omeprazole (PRILOSEC) 20 MG capsule Take 1 capsule (20 mg total) by mouth daily. (Patient taking differently: Take 20 mg by mouth daily as needed (acid reflux).) 30 capsule 2   ondansetron (ZOFRAN) 4 MG tablet Take 1 tablet (4 mg total) by mouth daily as needed for nausea or vomiting. 30 tablet 1   ondansetron (ZOFRAN) 8 MG tablet Take 1 tablet (8 mg total) by mouth 2 (two) times daily as needed for refractory nausea / vomiting. Start on day 3 after chemo. 30 tablet 1   ondansetron (ZOFRAN-ODT) 4 MG disintegrating tablet Take 1-2 tablets (4-8 mg total) by mouth every 8 (eight) hours as needed for nausea. 60  tablet 3   OXcarbazepine (TRILEPTAL) 300 MG/5ML suspension Take 2.5 mLs (150 mg total) by mouth 2 (two) times daily. 250 mL 3   Polyethyl Glycol-Propyl Glycol (SYSTANE) 0.4-0.3 % GEL ophthalmic gel Place 1 application into both eyes 2 (two) times daily as needed (dry eyes).     prochlorperazine (COMPAZINE) 10 MG tablet Take 1 tablet (10 mg total) by mouth every 6 (six) hours as needed (Nausea or vomiting). 30 tablet 1   No current facility-administered medications for this visit.    PHYSICAL EXAMINATION: ECOG PERFORMANCE STATUS: 1 - Symptomatic but completely ambulatory  Vitals:   03/22/21 1013  BP: 130/69  Pulse: 66  Resp: 16  Temp: 97.9 F (36.6 C)  SpO2: 98%   Wt Readings from Last 3 Encounters:  03/22/21 126 lb 4.8 oz (57.3 kg)  03/16/21 125 lb 9.6 oz (57 kg)  03/09/21 123 lb 3.2 oz (55.9 kg)     GENERAL:alert, no distress and comfortable SKIN: skin color, texture, turgor are normal, no rashes or significant lesions EYES: normal, Conjunctiva are pink and non-injected, sclera clear OROPHARYNX:no exudate, no erythema and lips, buccal mucosa, and tongue normal ABDOMEN:abdomen soft, non-tender and normal bowel sounds. Mild redness around stitch of feeding tube, no discharge. NEURO: alert & oriented x 3 with fluent speech, no focal motor/sensory deficits  LABORATORY DATA:  I have reviewed the data as listed CBC Latest Ref Rng & Units 03/22/2021 03/16/2021 03/09/2021  WBC 4.0 - 10.5 K/uL 2.3(L) 3.8(L) 7.7  Hemoglobin 12.0 - 15.0 g/dL 11.5(L) 12.1 13.0  Hematocrit 36.0 - 46.0 % 34.8(L) 36.3 40.7  Platelets 150 - 400 K/uL 199 247 257     CMP Latest Ref Rng & Units 03/22/2021 03/16/2021 03/09/2021  Glucose 70 - 99 mg/dL 120(H) 104(H) 202(H)  BUN 8 - 23 mg/dL 22 27(H) 29(H)  Creatinine 0.44 - 1.00 mg/dL 0.75 0.75 0.83  Sodium 135 - 145 mmol/L 139 139 140  Potassium 3.5 - 5.1 mmol/L 4.4 4.3 4.1  Chloride 98 -  111 mmol/L 102 101 102  CO2 22 - 32 mmol/L 31 30 29   Calcium 8.9  - 10.3 mg/dL 9.3 9.9 10.0  Total Protein 6.5 - 8.1 g/dL 6.3(L) 6.3(L) 6.6  Total Bilirubin 0.3 - 1.2 mg/dL 0.7 0.5 0.5  Alkaline Phos 38 - 126 U/L 72 78 81  AST 15 - 41 U/L 17 16 19   ALT 0 - 44 U/L 17 17 17       RADIOGRAPHIC STUDIES: I have personally reviewed the radiological images as listed and agreed with the findings in the report. No results found.    No orders of the defined types were placed in this encounter.  All questions were answered. The patient knows to call the clinic with any problems, questions or concerns. No barriers to learning was detected. The total time spent in the appointment was 30 minutes.     [name], MD 03/22/2021   I, Wilburn Mylar, am acting as scribe for [name], MD.   I have reviewed the above documentation for accuracy and completeness, and I agree with the above.

## 2021-03-22 NOTE — Patient Instructions (Signed)
[facility]  Discharge Instructions: Thank you for choosing [facility] to provide your oncology and hematology care.   If you have a lab appointment with the [facility], please go directly to the [facility] and check in at the registration area.   Wear comfortable clothing and clothing appropriate for easy access to any Portacath or PICC line.   We strive to give you quality time with your provider. You may need to reschedule your appointment if you arrive late (15 or more minutes).  Arriving late affects you and other patients whose appointments are after yours.  Also, if you miss three or more appointments without notifying the office, you may be dismissed from the clinic at the provider's discretion.      For prescription refill requests, have your pharmacy contact our office and allow 72 hours for refills to be completed.    Today you received the following chemotherapy and/or immunotherapy agents: Paclitaxel (Taxol) and Carboplatin.   To help prevent nausea and vomiting after your treatment, we encourage you to take your nausea medication as directed.  BELOW ARE SYMPTOMS THAT SHOULD BE REPORTED IMMEDIATELY: *FEVER GREATER THAN 100.4 F (38 C) OR HIGHER *CHILLS OR SWEATING *NAUSEA AND VOMITING THAT IS NOT CONTROLLED WITH YOUR NAUSEA MEDICATION *UNUSUAL SHORTNESS OF BREATH *UNUSUAL BRUISING OR BLEEDING *URINARY PROBLEMS (pain or burning when urinating, or frequent urination) *BOWEL PROBLEMS (unusual diarrhea, constipation, pain near the anus) TENDERNESS IN MOUTH AND THROAT WITH OR WITHOUT PRESENCE OF ULCERS (sore throat, sores in mouth, or a toothache) UNUSUAL RASH, SWELLING OR PAIN  UNUSUAL VAGINAL DISCHARGE OR ITCHING   Items with * indicate a potential emergency and should be followed up as soon as possible or go to the Emergency Department if any problems should occur.  Please show the CHEMOTHERAPY ALERT CARD or IMMUNOTHERAPY  ALERT CARD at check-in to the Emergency Department and triage nurse.  Should you have questions after your visit or need to cancel or reschedule your appointment, please contact [facility]  Dept: [phone]  and follow the prompts.  Office hours are 8:00 a.m. to 4:30 p.m. Monday - Friday. Please note that voicemails left after 4:00 p.m. may not be returned until the following business day.  We are closed weekends and major holidays. You have access to a nurse at all times for urgent questions. Please call the main number to the clinic Dept: [phone] and follow the prompts.   For any non-urgent questions, you may also contact your provider using MyChart. We now offer e-Visits for anyone 52 and older to request care online for non-urgent symptoms. For details visit [email or web address].   Also download the MyChart app! Go to the app store, search "MyChart", open the app, select [facility], and log in with your MyChart username and password.  Due to Covid, a mask is required upon entering the hospital/clinic. If you do not have a mask, one will be given to you upon arrival. For doctor visits, patients may have 1 support person aged 38 or older with them. For treatment visits, patients cannot have anyone with them due to current Covid guidelines and our immunocompromised population.

## 2021-03-22 NOTE — Progress Notes (Addendum)
Nutrition Follow-up:  Patient with esophageal cancer.  Patient followed by Dr Feng and receiving chemotherapy and radiation.    Met with patient during infusion.  Patient reports that she is taking 5 cartons of osmolite 1.5 daily.  Continues to drink boost shake or other protein shake orally about 1 a day.  Taking a few bites of soups, mostly liquids orally.  Reports minimal nausea. Says that she is having regular bowel movement.   Patient requested RD's help with doing tube feeding during infusion.  RD could not find lopez valve or syringe in patient's bag.  Patient extremely sleepy towards the end of conversation due to benadryl. Decided to hold feeding until more alert.  RD came back ~1pm with syringe and lopez valve to assist patient with tube feeding.  Patient sitting up and alert and drinking osmolite 1.5  via straw.  Says that she is just going to drink the formula today vs giving via tube.      Medications: reviewed  Labs: reviewed  Anthropometrics:   Weight 126 lb 4.8 oz today  125 lb 9.6 oz on 10/25  123 lb 3.2 oz on 10/18 117 lb 15 oz on 10/7   Estimated Energy Needs  Kcals: 1660-1825 Protein: 72-88 g Fluid: 1.7 L  NUTRITION DIAGNOSIS: Inadequate oral intake continues but relying on feeding tube for nutrition   INTERVENTION:  Continue osmolite 1.5, 5 cartons per day with 60ml flush before and after each feeding.  Patient to drink by mouth or give via tube additional 1 1/2 cups of fluids/day.  Provides 1775 calories, 74.5 g of protein and 1865ml free water    MONITORING, EVALUATION, GOAL: weight trends, tube feeding, intake   NEXT VISIT: Monday, Nov 7 during infusion   B. , RD, LDN Registered Dietitian [phone] (mobile)   

## 2021-03-22 NOTE — Progress Notes (Signed)
Pt. received Benadryl 50 mg IV as premedication. She had moderate restless leg/twitching and sedation. Dr. [name] notified for possible dose reduction.

## 2021-03-22 NOTE — Telephone Encounter (Signed)
LMVM for pt to return call.   

## 2021-03-22 NOTE — Telephone Encounter (Signed)
-----   Message from Viona Gilmore, [facility] sent at 03/22/2021  1:35 PM EDT ----- Regarding: FW: Switching to liquid medications Can you please send this in because I cannot? Benazepril and lisinopril are 1:1 and she is taking it once daily now so the equivalent would be lisinopril 20 mg in liquid form once daily. Thank you! ----- Message ----- From: [name], MD Sent: 03/22/2021   1:28 PM EDT To: Viona Gilmore, RPH Subject: RE: Switching to liquid medications            Fine with me  , please send in lisinopril equivalent  6 mos worth to pharmacy [facility] ----- Message ----- From: Viona Gilmore, [facility] Sent: 03/18/2021  11:09 AM EDT To: [name], MD Subject: RE: Switching to liquid medications            Yes, her husband is still looking for a liquid for her benazepril. Can we switch her to either lisinopril or enalapril? They both have liquids available.  ----- Message ----- From: [name], MD Sent: 03/15/2021   7:40 PM EDT To: Viona Gilmore, RPH Subject: RE: Switching to liquid medications            She now has a gast tube and is getting chemo   . Not sure  what has been arranged and meds stopped since she has lost so much weight.   Can  you contact her again to see  if we still need to  change med preps ( I was out of office when message came in) \ Thanks WP ----- Message ----- From: Viona Gilmore, [place]: 03/05/2021   6:00 PM EDT To: [name], MD Subject: Switching to liquid medications                Hi,  [name] requested to see if her medications could be switched to liquids instead of tablets to avoid having to crush all of them. I don't know what the cost will be until we try to send it to the pharmacy. Can you possibly send in the liquid omeprazole and furosemide? They have suspensions of each I believe. Also, could we possibly switch the benazepril to another ACE inhibitor that comes as suspension? It looks like both lisinopril and  enalapril have one available.  Let me know if you are able to find these in the system! It says they are available commercially but that may not always be true.  Thanks! [name]

## 2021-03-22 NOTE — Telephone Encounter (Signed)
Pt called states she has some questions regarding the medication that she made some changes to, and also wants to discuss a referral pt requesting a call back.

## 2021-03-23 ENCOUNTER — Ambulatory Visit
Admission: RE | Admit: 2021-03-23 | Discharge: 2021-03-23 | Disposition: A | Discharge status: Home / Self Care | Payer: Medicare Other | Source: Ambulatory Visit | Attending: Radiation Oncology | Admitting: Radiation Oncology

## 2021-03-23 ENCOUNTER — Other Ambulatory Visit: Payer: Self-pay | Admitting: Neurology

## 2021-03-23 DIAGNOSIS — C153 Malignant neoplasm of upper third of esophagus: Secondary | ICD-10-CM | POA: Diagnosis not present

## 2021-03-23 DIAGNOSIS — Z79899 Other long term (current) drug therapy: Secondary | ICD-10-CM

## 2021-03-23 DIAGNOSIS — Z510 Encounter for antineoplastic radiation therapy: Secondary | ICD-10-CM | POA: Diagnosis not present

## 2021-03-23 MED ORDER — LISINOPRIL 1 MG/ML PO SOLN: ORAL | 2 refills | Status: DC

## 2021-03-23 NOTE — Progress Notes (Signed)
Call to patient advised her that the Benazepril had been replaced with liquid Lisinopril and can be picked up at her convenience from her pharmacy. Patient voiced understanding.  [place] Clinical Pharmacist Assistant [phone]

## 2021-03-23 NOTE — Addendum Note (Signed)
Addended byShanon Ace K on: 03/23/2021 12:45 PM   Modules accepted: Orders

## 2021-03-23 NOTE — Telephone Encounter (Signed)
Called patient again.  Pt stated that the gamma knife procedure did not work out, no relief.  She would like to be referred to Dr. Vertell Limber (NS) for consultation if he would have any other options.  Also they are taking the oxcarbamazepine liquid but the lamotrigine odt they have not started yet.  I recommended to call [facility] and see what the hold up is.  They will do this.  Other issue she wanted to tell us was oxcarbamazepine building up of side effects over time (mobility (initially using cane, now has to use walker, vision changes (diminished vision, double vision (line and dots) and cognitive issues.  She stated when she had cut her dose of medication she noted an increased improvement of all above.  Her question is to change to something else?  Her husband then said that due to breakthru pain he increased her lamotrigine to 279m po bid yesterday.  If you feel appt would be better to address let me know, or is answer questions and relay to them.

## 2021-03-24 ENCOUNTER — Ambulatory Visit: Payer: Medicare Other | Admitting: Adult Health

## 2021-03-24 ENCOUNTER — Ambulatory Visit
Admission: RE | Admit: 2021-03-24 | Discharge: 2021-03-24 | Disposition: A | Discharge status: Home / Self Care | Payer: Medicare Other | Source: Ambulatory Visit | Attending: Radiation Oncology | Admitting: Radiation Oncology

## 2021-03-24 ENCOUNTER — Other Ambulatory Visit: Payer: Self-pay

## 2021-03-24 ENCOUNTER — Encounter: Payer: Self-pay | Admitting: Adult Health

## 2021-03-24 VITALS — BP 125/70 | HR 66 | Ht 66.0 in | Wt 128.0 lb

## 2021-03-24 DIAGNOSIS — G500 Trigeminal neuralgia: Secondary | ICD-10-CM

## 2021-03-24 DIAGNOSIS — C153 Malignant neoplasm of upper third of esophagus: Secondary | ICD-10-CM | POA: Diagnosis not present

## 2021-03-24 DIAGNOSIS — Z510 Encounter for antineoplastic radiation therapy: Secondary | ICD-10-CM | POA: Diagnosis not present

## 2021-03-24 DIAGNOSIS — Z79899 Other long term (current) drug therapy: Secondary | ICD-10-CM

## 2021-03-24 NOTE — Progress Notes (Signed)
PATIENT: [name] DOB: 27-Mar-1941  REASON FOR VISIT: follow up HISTORY FROM: patient PRIMARY NEUROLOGIST:   HISTORY OF PRESENT ILLNESS: Today 03/24/21 [name] is a 80 y.o. female with a history of trigeminal neuralgia here today for a follow up. Patient had Gamma Knife Procedure in August, she had some initial relief then she started having breakthrough pain. Having breakthrough TN episodes nearly daily, they are worse in the evenings. She states that she rarely gets it during sleep, and last night she had woken from her sleep with terrible pain.  Patient endorses that the oxcarbamazepine starting causing side effects such as mobility (initially using cane, now has to use walker), vision changes (diminished vision, double vision (line and dots) and cognitive issues. She stated when she had decreased her dose to 150 mg BID, she noted an increased improvement of all above and her ambulation has improved slightly as well. Her husband then said that due to breakthru pain he increased her lamotrigine to 22m po bid, they have done this for three days.   Patient would like to see Dr. SVertell Limber unfortunately he is out of the office for the next 12 weeks.Encouraged patient to reach out to his office to determine if there is another provider that specializes in those treatments in his office.   HISTORY  07/08/2020:   [facility] also to increase lamotrigine to 2030mpo bid. She has breakthrough pain. It gets worse around 8pm. Just before that 8pm deadline it gets bad and when she eats it breaks through. She has an appointment with Dr. laArlan Organn 6/8. She has to bring all imaging. She had vascular decompression in 2013, lasted 9 years, may need revision?    Patient is here on concern of memory changes. She says in the last month she has gotten much better as far as the sciatica goes. She can walk around te house. She uses a caProgrammer, multimediaround the house. She did  therapy last spring, and so she is trying to strengthen the muscles in her legs. 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 She is very lucid right now but in the morning she gets unstable and is confused. She can almost fall after she takes her medicine. She feels fantastic now on the oxcarb and lamictal.    MRI brain: No evidence of recent infarction, hemorrhage, or mass.  Stable postoperative changes of prior trigeminal nerve decompression.Stable mild chronic microvascular ischemic changes' . MRI cervical spine:  No abnormal cord signal.  Multilevel degenerative changes as detailed above. No high-grade canal stenosis. Foraminal narrowing is greatest on the left at C5-C6.   TSH 0.88, B12 933,    Patient complains of symptoms per HPI as well as the following symptoms: imbalance, falls . Pertinent negatives and positives per HPI. All others negative   05/11/2020: She is taking more Lamictal. She had the procedure with Dr. StVertell Limberher right face was numb for some time, there ois discomfort now, once in a while she gets the lightning strike, the procedure made a huge difference. She is on 10044mwice a day of the Lamictal.    [name] a 80 89o. female here as requested by [name],  [name], MD for Trigeminal neuralgia. 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 Mart Piggs in the past, remote kidney infection, hypertension, hyperlipidemia, hard of hearing, fatty liver, anxiety and depression.  I reviewed Dr. Melven Sartorius notes, she was seen there for evaluation of bilateral leg pain, increased leg pain left  greater than right 6 months, weakness in both legs, she had epidural steroid injections x3, most recently left L4-L5 in November with no relief, numerous medications trialed most recently Cymbalta, Tylenol, Motrin.  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 Reviewed Dr. [name] notes, patient sodium was affected on Tegretol.    She had surgery at [facility] clinic in 2013 for trigeminal neuralgia, she was told hopefully the surgery would help but since then she had been increasing the medication and increased to 1234m a day and was decreased due to blood work and side effects, and now she has stopped the carbamazepine. She was getting side effects from the carbamazepine but it was helping. Dr. SVertell Limberplaced her on Oxcarbazepine(trileptal) and now she is on cymbalta, she started the Trileptal a few weeks ago. She was started on 3085mand just a few days ago she was increased to 60038mwice a day(1200m43mday). She is not taking the cymbalta, was likely prescribed for low back pain (Dr. NewtErnestina Patcheshe pain starts on the right and shoots down her jaw, chewing, talking makes it worse, severe, shooting, same pain she has always experienced. The pain is severe today. She says she is unstable and tired, gets better after 45 minutes. When she did the clock test she could not draw the  clock. No coordination. All the numbers were in sequence. She could not draw.      REVIEW OF SYSTEMS: Out of a complete 14 system review of symptoms, the patient complains only of the following symptoms, and all other reviewed systems are negative.  ALLERGIES: Allergies  Allergen Reactions   Hydrocodone Other (See Comments)    Rebound headaches   Sulfamethoxazole-Trimethoprim Hives and Itching    REACTION: unspecified    HOME MEDICATIONS: Outpatient Medications Prior to Visit  Medication Sig Dispense Refill   acetaminophen (TYLENOL) 325 MG tablet Take 650 mg by mouth every 6 (six) hours as needed for moderate pain.     alendronate (FOSAMAX) 70 MG tablet TAKE 1 TABLET BY MOUTH 1 TIME A WEEK WITH FULL GLASS OF WATER AND ON AN EMPTY STOMACH AS DIRECTED 4 tablet 2   Calcium Carb-Cholecalciferol (CALCIUM 600+D) 600-800 MG-UNIT TABS Take 1 tablet by mouth 2 (two) times daily.     carvedilol (COREG) 25 MG tablet TAKE 1 TABLET(25 MG) BY MOUTH TWICE DAILY WITH A MEAL 60 tablet 3   conjugated estrogens (PREMARIN) vaginal cream Place 1 applicator vaginally 2 (two) times a week.     doxycycline (VIBRAMYCIN) 100 MG capsule Take 100 mg by mouth  2 (two) times daily.     fluticasone (FLONASE) 50 MCG/ACT nasal spray Place 2 sprays into both nostrils as needed for allergies or rhinitis. 16 g 5   furosemide (LASIX) 40 MG tablet Take 1 tablet (40 mg total) by mouth daily. 30 tablet 3   lamotrigine (LAMICTAL) 25 MG disintegrating tablet Dissolve 2 tablets (50 mg) under tongue twice daily along with 100 mg for total dose of 150 mg twice daily. 120 tablet 3   lamoTRIgine 100 MG TBDP Dissolve 1 tablet (100 mg) under tongue twice daily along with 50 mg for total dose of 150 mg twice daily. 60 tablet 3   Lisinopril 1 MG/ML SOLN Take 20 mg  daily Per g tube 600 mL 2   LORazepam (ATIVAN) 0.5 MG tablet Take 1 tablet by mouth as needed 30-60 minutes before radiation for anxiety 30 tablet 0   magic mouthwash  w/lidocaine SOLN Take 5 mLs by mouth every 6 (six) hours as needed for mouth pain. 480 mL 1   Multiple Vitamin (MULTIVITAMIN ADULT PO) Take 1 tablet by mouth daily.     omeprazole (PRILOSEC) 20 MG capsule Take 1 capsule (20 mg total) by mouth daily. (Patient taking differently: Take 20 mg by mouth daily as needed (acid reflux).) 30 capsule 2   ondansetron (ZOFRAN) 4 MG tablet Take 1 tablet (4 mg total) by mouth daily as needed for nausea or vomiting. 30 tablet 1   ondansetron (ZOFRAN) 8 MG tablet Take 1 tablet (8 mg total) by mouth 2 (two) times daily as needed for refractory nausea / vomiting. Start on day 3 after chemo. 30 tablet 1   ondansetron (ZOFRAN-ODT) 4 MG disintegrating tablet Take 1-2 tablets (4-8 mg total) by mouth every 8 (eight) hours as needed for nausea. 60 tablet 3   OXcarbazepine (TRILEPTAL) 300 MG/5ML suspension Take 2.5 mLs (150 mg total) by mouth 2 (two) times daily. 250 mL 3   Polyethyl Glycol-Propyl Glycol (SYSTANE) 0.4-0.3 % GEL ophthalmic gel Place 1 application into both eyes 2 (two) times daily as needed (dry eyes).     prochlorperazine (COMPAZINE) 10 MG tablet Take 1 tablet (10 mg total) by mouth every 6 (six) hours as needed (Nausea or vomiting). 30 tablet 1   No facility-administered medications prior to visit.    PAST MEDICAL HISTORY: Past Medical History:  Diagnosis Date   Abdominal pain 05/29/2013   s/p rx of cephalo resistant e coli   but last rx NG  now residular ?  bladder sx repeat cx sx rx to ty and uro consult    ADJ DISORDER WITH MIXED ANXIETY & DEPRESSED MOOD 03/03/2010   Qualifier: Diagnosis of  By: [name], [name]    Agent resistant to multiple antibiotics 05/29/2013   e coli   bu NG on fu.     Anemia    Anxiety    ARF (acute renal failure) ([place]) 03/12/2015   Cancer ([place])    Closed head injury 02/01/2011   from syncope and had scalp laceration  neg ct .     Closed head injury 5-6 yrs ago   Colitis 69/48/5462   Complication of anesthesia     migraine several hours after general anesthesia   Depression    Fatty liver    Gall stones 2016   see ct scan neg HIDA    GERD (gastroesophageal reflux disease)    Hearing aid worn    HOH (hard of hearing)    both ears  Hyperlipidemia    Hypertension    echo nl lv function  mild dilitation 2009   Kidney infection    few yrs ago in hospital   Medication side effect 09/02/2010   Poss muscle se of 10 crestor    Migraine    hypnic HA eval by Dr. 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    Pyelonephritis 03/12/2015   Sensation of pain in anesthetized distribution of trigeminal nerve    Syncope 02/01/2011   In shower on vacation  sustained head laceration  8 sutures Had ed visit neg head ct labs and x ray    Trigeminal neuralgia pain     PAST SURGICAL HISTORY: Past Surgical History:  Procedure Laterality Date   ABDOMINAL HYSTERECTOMY  2002   tubal   BACK SURGERY     2 times, for sciatic nerve pain   CARDIAC CATHETERIZATION  2000   chest pains neg   CHOLECYSTECTOMY N/A 02/21/2017   Procedure: LAPAROSCOPIC CHOLECYSTECTOMY WITH INTRAOPERATIVE CHOLANGIOGRAM;  Surgeon: Armandina Gemma, MD;  Location: [facility];  Service: General;  Laterality: N/A;   COLONOSCOPY     multiple   CRANIOTOMY  12/09/2011   nerve decompression right trigeminal    DIRECT LARYNGOSCOPY N/A 02/12/2021   Procedure: DIRECT LARYNGOSCOPY AND BIOPSY POSSIBLE FROZEN;  Surgeon: [name], MD;  Location: [facility];  Service: ENT;  Laterality: N/A;   DOPPLER ECHOCARDIOGRAPHY  2009   nl lv function mild lv dilitation   EYE SURGERY Bilateral    ioc for catatracts   GASTROSTOMY N/A 02/26/2021   Procedure: OPEN GASTROSTOMY TUBE PLACEMENT;  Surgeon: Armandina Gemma, MD;  Location: [facility];  Service: General;  Laterality: N/A;   IR GASTROSTOMY TUBE MOD SED  02/22/2021   laparoscopic gallbladder surgery  02/16/2017   Fax from [facility] Bilateral  2002   rt shoulder surgery      FAMILY HISTORY: Family History  Problem Relation Age of Onset   Cancer Mother        uterine   Ovarian cancer Mother    Stroke Mother    Alcohol abuse Father    Stroke Father    Diabetes Brother    Cancer Paternal Aunt        leukemia, unknown type   Seizures Daughter    Hypertension Other    Colon cancer Neg Hx    Dementia Neg Hx    Alzheimer's disease Neg Hx     SOCIAL HISTORY: Social History   Socioeconomic History   Marital status: Married    Spouse name: Not on file   Number of children: 2   Years of education: Not on file   Highest education level: Not on file  Occupational History    Comment: retired Forensic psychologist  Tobacco Use   Smoking status: Never   Smokeless tobacco: Never  Vaping Use   Vaping Use: Never used  Substance and Sexual Activity   Alcohol use: Not Currently    Alcohol/week: 2.0 standard drinks    Types: 2 Glasses of wine per week    Comment: occ wine   Drug use: No   Sexual activity: Not on file  Other Topics Concern   Not on file  Social History Narrative   Married   [place] of 2-3 (god daughter)   Pets 2 dogs   Non smoker    Child is a Engineer, drilling  G2P2      Caffeine: 2 cups/day   Social Determinants of Health   Financial Resource Strain: Low Risk    Difficulty of Paying Living Expenses: Not hard at all  Food Insecurity: No Food Insecurity   Worried About Charity fundraiser in the Last Year: Never true   Ran Out of Food in the Last Year: Never true  Transportation Needs: No Transportation Needs   Lack of Transportation (Medical): No   Lack of Transportation (Non-Medical): No  Physical Activity: Inactive   Days of Exercise per Week: 0 days   Minutes of Exercise per Session: 0 min  Stress: No Stress Concern Present   Feeling of Stress : Not at all  Social Connections: Moderately Integrated   Frequency of Communication with Friends and Family: More than three times a week   Frequency of  Social Gatherings with Friends and Family: Once a week   Attends Religious Services: 1 to 4 times per year   Active Member of [employer] or Organizations: No   Attends Archivist Meetings: Never   Marital Status: Married  Human resources officer Violence: Not At Risk   Fear of Current or Ex-Partner: No   Emotionally Abused: No   Physically Abused: No   Sexually Abused: No      PHYSICAL EXAM  There were no vitals filed for this visit. There is no height or weight on file to calculate BMI.  Generalized: Well developed, in no acute distress   Neurological examination  Mentation: Alert oriented to time, place, history taking. Follows all commands speech and language fluent Cranial nerve II-XII: Pupils were equal round reactive to light. Extraocular movements were full, visual field were full on confrontational test. Facial sensation and strength were normal. Uvula tongue midline. Head turning and shoulder shrug  were normal and symmetric. Motor: The motor testing reveals 5 over 5 strength of all 4 extremities. Good symmetric motor tone is noted throughout.  Sensory: Sensory testing is intact to soft touch on all 4 extremities. No evidence of extinction is noted.  Coordination: Cerebellar testing reveals good finger-nose-finger and heel-to-shin bilaterally.  Gait and station: Gait is normal. Tandem gait is normal. Romberg is negative. No drift is seen.  Reflexes: Deep tendon reflexes are symmetric and normal bilaterally.   DIAGNOSTIC DATA (LABS, IMAGING, TESTING) - I reviewed patient records, labs, notes, testing and imaging myself where available.  Lab Results  Component Value Date   WBC 2.3 (L) 03/22/2021   HGB 11.5 (L) 03/22/2021   HCT 34.8 (L) 03/22/2021   MCV 90.2 03/22/2021   PLT 199 03/22/2021      Component Value Date/Time   NA 139 03/22/2021 0951   NA 142 11/26/2020 1105   K 4.4 03/22/2021 0951   CL 102 03/22/2021 0951   CO2 31 03/22/2021 0951   GLUCOSE 120 (H)  03/22/2021 0951   GLUCOSE 97 03/17/2006 0820   BUN 22 03/22/2021 0951   BUN 20 11/26/2020 1105   CREATININE 0.75 03/22/2021 0951   CALCIUM 9.3 03/22/2021 0951   PROT 6.3 (L) 03/22/2021 0951   PROT 6.6 11/26/2020 1105   ALBUMIN 3.4 (L) 03/22/2021 0951   ALBUMIN 4.5 11/26/2020 1105   AST 17 03/22/2021 0951   ALT 17 03/22/2021 0951   ALKPHOS 72 03/22/2021 0951   BILITOT 0.7 03/22/2021 0951   GFRNONAA >60 03/22/2021 0951   GFRAA 62 07/08/2020 1653   Lab Results  Component Value Date   CHOL 279 (H) 05/27/2019  HDL 118.80 05/27/2019   LDLCALC 145 (H) 05/27/2019   LDLDIRECT 135.9 10/16/2012   TRIG 78.0 05/27/2019   CHOLHDL 2 05/27/2019   Lab Results  Component Value Date   HGBA1C 5.5 05/25/2018   Lab Results  Component Value Date   VITAMINB12 933 (H) 07/01/2020   Lab Results  Component Value Date   TSH 0.88 07/01/2020      ASSESSMENT AND PLAN 80 y.o. year old female  has a past medical history of Abdominal pain (05/29/2013), ADJ DISORDER WITH MIXED ANXIETY & DEPRESSED MOOD (03/03/2010), Agent resistant to multiple antibiotics (05/29/2013), Anemia, Anxiety, ARF (acute renal failure) ([place]) (03/12/2015), Cancer ([place]), Closed head injury (02/01/2011), Closed head injury (5-6 yrs ago), Colitis (81/02/3158), Complication of anesthesia, Depression, Fatty liver, Gall stones (2016), GERD (gastroesophageal reflux disease), Hearing aid worn, HOH (hard of hearing), Hyperlipidemia, Hypertension, Kidney infection, Medication side effect (09/02/2010), Migraine, Polycythemia, Positive PPD, Pyelonephritis (03/12/2015), Sensation of pain in anesthetized distribution of trigeminal nerve, Syncope (02/01/2011), and Trigeminal neuralgia pain. here with:   Trigeminal Neuralgia - Obtain Lamotrigine, 10-Hydrocycarbazepine, CMET, and CBC today - Continue Oxcarbazepine 150 mg BID - Continue Lamotrigine 200 mg BID - May consider adding baclofen in the future  - Contact Dr. Donald Pore office to see about  another provider who can see her sooner - Follow up in 6 months or sooner if needed    [name], MSN, NP-C 03/24/2021, 2:16 PM [facility] [address], [place], [place] [address] [phone]

## 2021-03-24 NOTE — Telephone Encounter (Signed)
I called and spoke to husband and made appt with pt today with NP, will have labs as well.  I relayed Dr. Vertell Limber out of office for 12 wks. They will come in arrive at 2p.

## 2021-03-25 ENCOUNTER — Ambulatory Visit
Admission: RE | Admit: 2021-03-25 | Discharge: 2021-03-25 | Disposition: A | Discharge status: Home / Self Care | Payer: Medicare Other | Source: Ambulatory Visit | Attending: Radiation Oncology | Admitting: Radiation Oncology

## 2021-03-25 ENCOUNTER — Encounter: Payer: Self-pay | Admitting: Hematology

## 2021-03-25 DIAGNOSIS — Z510 Encounter for antineoplastic radiation therapy: Secondary | ICD-10-CM | POA: Diagnosis not present

## 2021-03-25 DIAGNOSIS — C153 Malignant neoplasm of upper third of esophagus: Secondary | ICD-10-CM | POA: Diagnosis not present

## 2021-03-26 ENCOUNTER — Other Ambulatory Visit: Payer: Self-pay | Admitting: Radiation Oncology

## 2021-03-26 ENCOUNTER — Ambulatory Visit
Admission: RE | Admit: 2021-03-26 | Discharge: 2021-03-26 | Disposition: A | Discharge status: Home / Self Care | Payer: Medicare Other | Source: Ambulatory Visit | Attending: Radiation Oncology | Admitting: Radiation Oncology

## 2021-03-26 ENCOUNTER — Other Ambulatory Visit: Payer: Self-pay

## 2021-03-26 ENCOUNTER — Encounter: Payer: Self-pay | Admitting: Hematology

## 2021-03-26 DIAGNOSIS — C153 Malignant neoplasm of upper third of esophagus: Secondary | ICD-10-CM | POA: Diagnosis not present

## 2021-03-26 DIAGNOSIS — Z510 Encounter for antineoplastic radiation therapy: Secondary | ICD-10-CM | POA: Diagnosis not present

## 2021-03-26 LAB — CBC WITH DIFFERENTIAL/PLATELET
Basophils Absolute: 0 10*3/uL (ref 0.0–0.2)
Basos: 1 %
EOS (ABSOLUTE): 0 10*3/uL (ref 0.0–0.4)
Eos: 1 %
Hematocrit: 35.4 % (ref 34.0–46.6)
Hemoglobin: 12.1 g/dL (ref 11.1–15.9)
Immature Grans (Abs): 0 10*3/uL (ref 0.0–0.1)
Immature Granulocytes: 0 %
Lymphocytes Absolute: 0.3 10*3/uL — ABNORMAL LOW (ref 0.7–3.1)
Lymphs: 11 %
MCH: 29.7 pg (ref 26.6–33.0)
MCHC: 34.2 g/dL (ref 31.5–35.7)
MCV: 87 fL (ref 79–97)
Monocytes Absolute: 0.1 10*3/uL (ref 0.1–0.9)
Monocytes: 6 %
Neutrophils Absolute: 2 10*3/uL (ref 1.4–7.0)
Neutrophils: 81 %
Platelets: 194 10*3/uL (ref 150–450)
RBC: 4.08 x10E6/uL (ref 3.77–5.28)
RDW: 13.3 % (ref 11.7–15.4)
WBC: 2.5 10*3/uL — CL (ref 3.4–10.8)

## 2021-03-26 LAB — COMPREHENSIVE METABOLIC PANEL
ALT: 17 IU/L (ref 0–32)
AST: 21 IU/L (ref 0–40)
Albumin/Globulin Ratio: 2.1 (ref 1.2–2.2)
Albumin: 4.2 g/dL (ref 3.6–4.6)
Alkaline Phosphatase: 85 IU/L (ref 44–121)
BUN/Creatinine Ratio: 35 — ABNORMAL HIGH (ref 12–28)
BUN: 26 mg/dL (ref 8–27)
Bilirubin Total: 0.4 mg/dL (ref 0.0–1.2)
CO2: 29 mmol/L (ref 20–29)
Calcium: 9.4 mg/dL (ref 8.7–10.3)
Chloride: 96 mmol/L (ref 96–106)
Creatinine, Ser: 0.75 mg/dL (ref 0.57–1.00)
Globulin, Total: 2 g/dL (ref 1.5–4.5)
Glucose: 99 mg/dL (ref 70–99)
Potassium: 4.8 mmol/L (ref 3.5–5.2)
Sodium: 136 mmol/L (ref 134–144)
Total Protein: 6.2 g/dL (ref 6.0–8.5)
eGFR: 80 mL/min/{1.73_m2} (ref >59)

## 2021-03-26 LAB — LAMOTRIGINE LEVEL: Lamotrigine Lvl: 8.7 ug/mL (ref 2.0–20.0)

## 2021-03-26 LAB — 10-HYDROXYCARBAZEPINE: Oxcarbazepine SerPl-Mcnc: 5 ug/mL — ABNORMAL LOW (ref 10–35)

## 2021-03-26 MED ORDER — SUCRALFATE 1 G PO TABS: 1.0000 g | ORAL_TABLET | Freq: Four times a day (QID) | ORAL | 2 refills | Status: DC

## 2021-03-26 MED FILL — Dexamethasone Sodium Phosphate Inj 100 MG/10ML: INTRAMUSCULAR | Qty: 1 | Status: AC

## 2021-03-28 NOTE — Progress Notes (Signed)
[name]   Telephone:([phone] [phone]   Clinic Follow up Note   Patient Care Team: [name], [name], MD as PCP - General [name], MD as PCP - Cardiology (Cardiology) Cindie Crumbly (Neurosurgery) Irine Seal, MD as Attending Physician (Urology) Erline Levine, MD as Consulting Physician (Neurosurgery) [name], MD as Consulting Physician (Neurology) Viona Gilmore, [facility] as Pharmacist (Pharmacist) [name], MD as Consulting Physician (Gastroenterology) Carol Ada, MD as Consulting Physician (Gastroenterology) [name], MD as Consulting Physician (Otolaryngology) [name], MD as Consulting Physician (Hematology) Kyung Rudd, MD as Consulting Physician (Radiation Oncology) 03/29/2021  CHIEF COMPLAINT: Follow up cancer of the cervical esophagus  SUMMARY OF ONCOLOGIC HISTORY: Oncology History  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  The base of tongue vallecula and epiglottis were normal.    Piriform sinuses were clear bilaterally and vocal cords were clear with  normal vocal mobility.  No structural abnormalities noted.   01/22/2021 Imaging   DG esophagus IMPRESSION: 1. Luminal narrowing in the high cervical esophagus over a 3 cm segment is highly concerning for esophageal neoplasm. Inflammatory process would be a secondary consideration. Luminal narrowing occurs approximately at the C5-C7 vertebral body level just below the glottis. Patient experienced several episodes of choking related to this luminal narrowing. Recommend expedient upper GI endoscopy for evaluation. 2. Distal thoracic esophagus and GE junction appear normal.   02/04/2021 Procedure   EGD by Dr. Carlean Purl impression- One mass-like severe stenosis was found in the upper third of the esophagus. The stenosis was  not traversed.   02/05/2021 Imaging   CT soft tissue neck w contrast IMPRESSION: Mass-like soft tissue thickening and mucosal hyperenhancement of the cervical esophagus and hypopharynx, spanning the C4-T1 levels, likely reflecting an esophageal malignancy. This measures up to 2.4 x 3.4 cm in transaxial dimensions, and 6.3 cm in craniocaudal dimension. Associated severe effacement of the esophageal lumen.   Centrally necrotic right paratracheal lymph node beneath the level of the mass, measuring 1.3 x 0.9 cm, and likely reflecting a site of nodal metastatic disease.   Additional lymph nodes along the posterior and inferior aspect of the right thyroid lobe (along the right aspect of the esophageal mass), which measure subcentimeter but are asymmetrically prominent and highly suspicious for additional sites of nodal metastatic disease.   02/12/2021 Pathology Results   FINAL MICROSCOPIC DIAGNOSIS:  A. ESOPHAGUS, UPPER CERVICAL #1, BIOPSY:  - Squamous cell carcinoma.  B. ESOPHAGUS, UPPER CERVICAL #2, BIOPSY:  - Squamous cell carcinoma.    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   Markedly hypermetabolic right upper paratracheal lymph node, compatible with metastatic disease.   Small right upper paratracheal and right level IIA lymph nodes with mild hypermetabolic activity, concerning for additional sites of nodal metastatic disease.   Aortic Atherosclerosis (ICD10-I70.0).   03/09/2021 -  Chemotherapy   Patient is on Treatment Plan : ESOPHAGUS  Carboplatin/PACLitaxel weekly x 6 weeks with XRT  CURRENT THERAPY: concurrent chemo/RT with carbo/taxol, radiation starting 03/08/21, chemo started 03/09/21   INTERVAL HISTORY: [name] returns for follow up and treatment as scheduled. She is beginning week 4 chemo/RT today.  She is tired but remains out of bed and up at home.  She reports a sore throat 6-7 out of 10, more secretions and cough, more gagging, and headaches.  Carafate helps her throat.  She tried Magic mouthwash with lidocaine but the swallow mechanism is numbed and she cannot get it down.  She still tolerates yogurt and pills by mouth, primary nutrition/hydration comes via tube 5 times per day.  There is minimal drainage on the bandage, she completed prophylactic doxycycline.  Denies redness or pain at the site.  She has developed "splotchy" skin with redness at her neck and chest and areas along her forearms.  Denies itching.  Otherwise, bowels moving okay.  Denies neuropathy, fever, chills, nausea/vomiting, chest pain, dyspnea.     MEDICAL HISTORY:  Past Medical History:  Diagnosis Date   Abdominal pain 05/29/2013   s/p rx of cephalo resistant e coli   but last rx NG  now residular ?  bladder sx repeat cx sx rx to ty and uro consult    ADJ DISORDER WITH MIXED ANXIETY & DEPRESSED MOOD 03/03/2010   Qualifier: Diagnosis of  By: [name], [name]    Agent resistant to multiple antibiotics 05/29/2013   e coli   bu NG on fu.     Anemia    Anxiety    ARF (acute renal failure) ([place]) 03/12/2015   Cancer ([place])    Closed head injury 02/01/2011   from syncope and had scalp laceration  neg ct .     Closed head injury 5-6 yrs ago   Colitis 37/02/6268   Complication of anesthesia    migraine several hours after general anesthesia   Depression    Fatty liver    Gall stones 2016   see ct scan neg HIDA    GERD (gastroesophageal reflux disease)    Hearing aid worn    HOH (hard of hearing)    both ears   Hyperlipidemia     Hypertension    echo nl lv function  mild dilitation 2009   Kidney infection    few yrs ago in hospital   Medication side effect 09/02/2010   Poss muscle se of 10 crestor    Migraine    hypnic HA eval by Dr. 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    Pyelonephritis 03/12/2015   Sensation of pain in anesthetized distribution of trigeminal nerve    Syncope 02/01/2011   In shower on vacation  sustained head laceration  8 sutures Had ed visit neg head ct labs and x ray    Trigeminal neuralgia pain     SURGICAL HISTORY: Past Surgical History:  Procedure Laterality Date   ABDOMINAL HYSTERECTOMY  2002   tubal   BACK SURGERY     2 times, for sciatic nerve pain   CARDIAC CATHETERIZATION  2000   chest pains neg   CHOLECYSTECTOMY N/A 02/21/2017   Procedure: LAPAROSCOPIC CHOLECYSTECTOMY WITH INTRAOPERATIVE CHOLANGIOGRAM;  Surgeon: Armandina Gemma, MD;  Location: [facility];  Service: General;  Laterality: N/A;   COLONOSCOPY     multiple   CRANIOTOMY  12/09/2011   nerve decompression right trigeminal    DIRECT LARYNGOSCOPY N/A 02/12/2021   Procedure: DIRECT LARYNGOSCOPY AND BIOPSY POSSIBLE  FROZEN;  Surgeon: [name], MD;  Location: [facility];  Service: ENT;  Laterality: N/A;   DOPPLER ECHOCARDIOGRAPHY  2009   nl lv function mild lv dilitation   EYE SURGERY Bilateral    ioc for catatracts   GASTROSTOMY N/A 02/26/2021   Procedure: OPEN GASTROSTOMY TUBE PLACEMENT;  Surgeon: Armandina Gemma, MD;  Location: [facility];  Service: General;  Laterality: N/A;   IR GASTROSTOMY TUBE MOD SED  02/22/2021   laparoscopic gallbladder surgery  02/16/2017   Fax from [facility] Bilateral 2002   rt shoulder surgery      I have reviewed the social history and family history with the patient and they are unchanged from previous note.  ALLERGIES:  is allergic to hydrocodone and sulfamethoxazole-trimethoprim.  MEDICATIONS:  Current  Outpatient Medications  Medication Sig Dispense Refill   acetaminophen (TYLENOL) 325 MG tablet Take 650 mg by mouth every 6 (six) hours as needed for moderate pain.     alendronate (FOSAMAX) 70 MG tablet TAKE 1 TABLET BY MOUTH 1 TIME A WEEK WITH FULL GLASS OF WATER AND ON AN EMPTY STOMACH AS DIRECTED 4 tablet 2   Calcium Carb-Cholecalciferol (CALCIUM 600+D) 600-800 MG-UNIT TABS Take 1 tablet by mouth 2 (two) times daily.     carvedilol (COREG) 25 MG tablet TAKE 1 TABLET(25 MG) BY MOUTH TWICE DAILY WITH A MEAL 60 tablet 3   conjugated estrogens (PREMARIN) vaginal cream Place 1 applicator vaginally 2 (two) times a week.     doxycycline (VIBRAMYCIN) 100 MG capsule Take 100 mg by mouth 2 (two) times daily.     fluticasone (FLONASE) 50 MCG/ACT nasal spray Place 2 sprays into both nostrils as needed for allergies or rhinitis. 16 g 5   furosemide (LASIX) 40 MG tablet Take 1 tablet (40 mg total) by mouth daily. 30 tablet 3   lamotrigine (LAMICTAL) 25 MG disintegrating tablet Dissolve 2 tablets (50 mg) under tongue twice daily along with 100 mg for total dose of 150 mg twice daily. 120 tablet 3   lamoTRIgine 100 MG TBDP Dissolve 1 tablet (100 mg) under tongue twice daily along with 50 mg for total dose of 150 mg twice daily. 60 tablet 3   Lisinopril 1 MG/ML SOLN Take 20 mg  daily Per g tube 600 mL 2   LORazepam (ATIVAN) 0.5 MG tablet Take 1 tablet by mouth as needed 30-60 minutes before radiation for anxiety 30 tablet 0   magic mouthwash w/lidocaine SOLN Take 5 mLs by mouth every 6 (six) hours as needed for mouth pain. 480 mL 1   Multiple Vitamin (MULTIVITAMIN ADULT PO) Take 1 tablet by mouth daily.     omeprazole (PRILOSEC) 20 MG capsule Take 1 capsule (20 mg total) by mouth daily. (Patient taking differently: Take 20 mg by mouth daily as needed (acid reflux).) 30 capsule 2   ondansetron (ZOFRAN) 4 MG tablet Take 1 tablet (4 mg total) by mouth daily as needed for nausea or vomiting. 30 tablet 1    ondansetron (ZOFRAN) 8 MG tablet Take 1 tablet (8 mg total) by mouth 2 (two) times daily as needed for refractory nausea / vomiting. Start on day 3 after chemo. 30 tablet 1   ondansetron (ZOFRAN-ODT) 4 MG disintegrating tablet Take 1-2 tablets (4-8 mg total) by mouth every 8 (eight) hours as needed for nausea. 60 tablet 3   OXcarbazepine (TRILEPTAL) 300 MG/5ML suspension Take 2.5 mLs (150 mg total) by mouth 2 (two) times  daily. 250 mL 3   Polyethyl Glycol-Propyl Glycol (SYSTANE) 0.4-0.3 % GEL ophthalmic gel Place 1 application into both eyes 2 (two) times daily as needed (dry eyes).     prochlorperazine (COMPAZINE) 10 MG tablet Take 1 tablet (10 mg total) by mouth every 6 (six) hours as needed (Nausea or vomiting). 30 tablet 1   sucralfate (CARAFATE) 1 g tablet Take 1 tablet (1 g total) by mouth 4 (four) times daily. Dissolve each tablet in 15 cc water before use. 120 tablet 2   No current facility-administered medications for this visit.   Facility-Administered Medications Ordered in Other Visits  Medication Dose Route Frequency Provider Last Rate Last Admin   CARBOplatin (PARAPLATIN) 130 mg in sodium chloride 0.9 % 100 mL chemo infusion  130 mg Intravenous Once [name], MD       dexamethasone (DECADRON) 10 mg in sodium chloride 0.9 % 50 mL IVPB  10 mg Intravenous Once [name], MD 204 mL/hr at 03/29/21 1251 10 mg at 03/29/21 1251   PACLitaxel (TAXOL) 78 mg in sodium chloride 0.9 % 250 mL chemo infusion (</= 29m/m2)  50 mg/m2 (Treatment Plan Recorded) Intravenous Once FTruitt Merle MD        PHYSICAL EXAMINATION: ECOG PERFORMANCE STATUS: 1-2  Vitals:   03/29/21 0958  BP: (!) 150/68  Pulse: 69  Resp: 17  Temp: (!) 97.4 F (36.3 C)  SpO2: 98%   Filed Weights   03/29/21 0958  Weight: 126 lb 3 oz (57.2 kg)    GENERAL:alert, no distress and comfortable SKIN: Erythema to the neck and supraclavicular areas.  Small scattered skin eruptions to the right forearm.  See images EYES: sclera  clear OROPHARYNX: no thrush or ulcers.  Mild pharyngeal erythema LUNGS: normal breathing effort HEART: Mild pedal edema and mottling at the toes NEURO: alert & oriented x 3 with fluent speech      LABORATORY DATA:  I have reviewed the data as listed CBC Latest Ref Rng & Units 03/29/2021 03/24/2021 03/22/2021  WBC 4.0 - 10.5 K/uL 2.2(L) 2.5(LL) 2.3(L)  Hemoglobin 12.0 - 15.0 g/dL 11.8(L) 12.1 11.5(L)  Hematocrit 36.0 - 46.0 % 35.7(L) 35.4 34.8(L)  Platelets 150 - 400 K/uL 199 194 199     CMP Latest Ref Rng & Units 03/29/2021 03/24/2021 03/22/2021  Glucose 70 - 99 mg/dL 140(H) 99 120(H)  BUN 8 - 23 mg/dL 19 26 22   Creatinine 0.44 - 1.00 mg/dL 0.73 0.75 0.75  Sodium 135 - 145 mmol/L 137 136 139  Potassium 3.5 - 5.1 mmol/L 4.6 4.8 4.4  Chloride 98 - 111 mmol/L 101 96 102  CO2 22 - 32 mmol/L 29 29 31   Calcium 8.9 - 10.3 mg/dL 9.4 9.4 9.3  Total Protein 6.5 - 8.1 g/dL 6.3(L) 6.2 6.3(L)  Total Bilirubin 0.3 - 1.2 mg/dL 0.6 0.4 0.7  Alkaline Phos 38 - 126 U/L 75 85 72  AST 15 - 41 U/L 15 21 17   ALT 0 - 44 U/L 16 17 17       RADIOGRAPHIC STUDIES: I have personally reviewed the radiological images as listed and agreed with the findings in the report. No results found.   ASSESSMENT & PLAN: 80yo female    1.Squamous cell carcinoma of cervical esophagus  -She developed dysphagia and weight loss, work up showed 3x6 cm mass in the cervical/upper esophagus with possible local adenopathy. Path confirmed squamous cell carcinoma of the esophagus  -She had feeding tube placed by Dr. GHarlow Asa10/7/22  -PET scan  shows hypermetabolic esophageal mass and hypermetabolic paratracheal nodes, negative to distant metastasis. This is local disease  -despite local disease, due to the location in the cervical esophagus, Dr. Kipp Brood feels she is not a surgical candidate. Her case was reviewed in tumor board  -the consensus is chemoRT with carbo/taxol. She consented and attended chemo education teaching.  Goal is curative -she has mild abnormal nerve sensation in the feet, no previous diagnosis of neuropathy. Will monitor closely on taxol.  -PDL1 testing is 95% positive, predicting a good response to immunotherapy which we plan to give after chemoRT for up to 2 years. Patient is interested. She may want a port a cath at some point, will discuss another day. -she began RT 03/08/21 and carbo/taxol 03/09/21, tolerating mostly well.  S/p week 3 she has skin change, sputum production, cough, headache, and sore throat -F/up weekly on chemoRT   2.Dysphagia, weight loss  -Secondary to #1 -She has not tolerated solids lately, only pureed or liquid diet with difficulty. Can change meds to liquid as applicable  -She is a candidate for feeding tube, IR could not access the stomach. She underwent surgical placement by Dr. Harlow Asa on 02/26/21 -tolerating 5 bolus feeds per day + 120 ml water q3 hours -no signs of refeeding syndrome. -Weight was increasing but she has lost 2 pounds over week 3.  Follow-up with dietitian today during treatment -She completed a course of prophylactic doxycycline per Dr. Harlow Asa for mild drainage (since 03/12/21); no current signs of infection   3.Trigeminal neuralgia, sciatica, headaches -On lamotrigene and oxcarbazepine  -pain has worsened lately, I reached out to Dr. Jaynee Eagles who had recently adjusted meds   4.HTN -continue benazepril, furosemide - I reduced benazepril to once daily from 03/09/21, and let her know to hold lasix if she becomes dizzy at home   -monitoring closely    5.Goals of Care -Code status was DNR from hospitalization 05/16/20, changed to full code during 02/26/2021 hospitalization for feeding tube placement   -goal of chemoRT is curative     Disposition: [name] appears stable.  She continues concurrent chemo RT with Taxol and carbo AUC 2.  She tolerated treatment well overall for first few weeks and has subsequently developed fatigue, headache,  sputum production/gagging, and skin change.  She has mild radiation dermatitis in the neck area, and additional scattered skin eruptions to the right forearm.  Etiology is unclear but suspicious for Taxol related drug rash.  We will monitor for now, she can use topical hydrocortisone if she develops itching. For sore throat, she will continue carafate and begin tylenol q6h PRN. She prefers to avoid hydrocodone/oxycodone.  Other side effects are adequately managed with supportive care at home.  She is able to recover and function mostly well.  Labs reviewed, CMP is stable, CBC shows ANC 1.5.  We reviewed infection precautions.  Continue concurrent chemo RT at the same dose.  We will get filgrastim approved and repeat CBC 11/11, in the event she becomes more neutropenic later on this week can give 1 dose on Saturday 11/12 (cannot give on days she receives radiation).   Follow-up 04/05/2021 with week 5.  The plan was reviewed with Dr. [name].   All questions were answered. The patient knows to call the clinic with any problems, questions or concerns. No barriers to learning were detected.  Total encounter time is 30 minutes.     [name], NP 03/29/21

## 2021-03-29 ENCOUNTER — Inpatient Hospital Stay: Payer: Medicare Other

## 2021-03-29 ENCOUNTER — Other Ambulatory Visit: Payer: Self-pay

## 2021-03-29 ENCOUNTER — Inpatient Hospital Stay: Payer: Medicare Other | Attending: Nurse Practitioner

## 2021-03-29 ENCOUNTER — Encounter: Payer: Self-pay | Admitting: Nurse Practitioner

## 2021-03-29 ENCOUNTER — Ambulatory Visit
Admission: RE | Admit: 2021-03-29 | Discharge: 2021-03-29 | Disposition: A | Discharge status: Home / Self Care | Payer: Medicare Other | Source: Ambulatory Visit | Attending: Radiation Oncology | Admitting: Radiation Oncology

## 2021-03-29 ENCOUNTER — Telehealth: Payer: Self-pay | Admitting: Nurse Practitioner

## 2021-03-29 ENCOUNTER — Inpatient Hospital Stay: Payer: Medicare Other | Admitting: Nurse Practitioner

## 2021-03-29 VITALS — BP 150/68 | HR 69 | Temp 97.4°F | Resp 17 | Wt 126.2 lb

## 2021-03-29 DIAGNOSIS — R042 Hemoptysis: Secondary | ICD-10-CM | POA: Insufficient documentation

## 2021-03-29 DIAGNOSIS — R634 Abnormal weight loss: Secondary | ICD-10-CM | POA: Diagnosis not present

## 2021-03-29 DIAGNOSIS — Z808 Family history of malignant neoplasm of other organs or systems: Secondary | ICD-10-CM | POA: Diagnosis not present

## 2021-03-29 DIAGNOSIS — C153 Malignant neoplasm of upper third of esophagus: Secondary | ICD-10-CM

## 2021-03-29 DIAGNOSIS — Z8041 Family history of malignant neoplasm of ovary: Secondary | ICD-10-CM | POA: Diagnosis not present

## 2021-03-29 DIAGNOSIS — L598 Other specified disorders of the skin and subcutaneous tissue related to radiation: Secondary | ICD-10-CM | POA: Diagnosis not present

## 2021-03-29 DIAGNOSIS — Z806 Family history of leukemia: Secondary | ICD-10-CM | POA: Diagnosis not present

## 2021-03-29 DIAGNOSIS — G500 Trigeminal neuralgia: Secondary | ICD-10-CM | POA: Insufficient documentation

## 2021-03-29 DIAGNOSIS — Z5111 Encounter for antineoplastic chemotherapy: Secondary | ICD-10-CM | POA: Insufficient documentation

## 2021-03-29 DIAGNOSIS — I10 Essential (primary) hypertension: Secondary | ICD-10-CM | POA: Diagnosis not present

## 2021-03-29 DIAGNOSIS — Z510 Encounter for antineoplastic radiation therapy: Secondary | ICD-10-CM | POA: Diagnosis not present

## 2021-03-29 DIAGNOSIS — R490 Dysphonia: Secondary | ICD-10-CM | POA: Diagnosis not present

## 2021-03-29 DIAGNOSIS — C159 Malignant neoplasm of esophagus, unspecified: Secondary | ICD-10-CM | POA: Diagnosis not present

## 2021-03-29 DIAGNOSIS — R1310 Dysphagia, unspecified: Secondary | ICD-10-CM | POA: Insufficient documentation

## 2021-03-29 LAB — CMP (CANCER CENTER ONLY)
ALT: 16 U/L (ref 0–44)
AST: 15 U/L (ref 15–41)
Albumin: 3.3 g/dL — ABNORMAL LOW (ref 3.5–5.0)
Alkaline Phosphatase: 75 U/L (ref 38–126)
Anion gap: 7 (ref 5–15)
BUN: 19 mg/dL (ref 8–23)
CO2: 29 mmol/L (ref 22–32)
Calcium: 9.4 mg/dL (ref 8.9–10.3)
Chloride: 101 mmol/L (ref 98–111)
Creatinine: 0.73 mg/dL (ref 0.44–1.00)
GFR, Estimated: >60 mL/min (ref >60)
Glucose, Bld: 140 mg/dL — ABNORMAL HIGH (ref 70–99)
Potassium: 4.6 mmol/L (ref 3.5–5.1)
Sodium: 137 mmol/L (ref 135–145)
Total Bilirubin: 0.6 mg/dL (ref 0.3–1.2)
Total Protein: 6.3 g/dL — ABNORMAL LOW (ref 6.5–8.1)

## 2021-03-29 LAB — CBC WITH DIFFERENTIAL (CANCER CENTER ONLY)
Abs Immature Granulocytes: 0.02 10*3/uL (ref 0.00–0.07)
Basophils Absolute: 0 10*3/uL (ref 0.0–0.1)
Basophils Relative: 1 %
Eosinophils Absolute: 0 10*3/uL (ref 0.0–0.5)
Eosinophils Relative: 1 %
HCT: 35.7 % — ABNORMAL LOW (ref 36.0–46.0)
Hemoglobin: 11.8 g/dL — ABNORMAL LOW (ref 12.0–15.0)
Immature Granulocytes: 1 %
Lymphocytes Relative: 14 %
Lymphs Abs: 0.3 10*3/uL — ABNORMAL LOW (ref 0.7–4.0)
MCH: 29.8 pg (ref 26.0–34.0)
MCHC: 33.1 g/dL (ref 30.0–36.0)
MCV: 90.2 fL (ref 80.0–100.0)
Monocytes Absolute: 0.3 10*3/uL (ref 0.1–1.0)
Monocytes Relative: 13 %
Neutro Abs: 1.5 10*3/uL — ABNORMAL LOW (ref 1.7–7.7)
Neutrophils Relative %: 70 %
Platelet Count: 199 10*3/uL (ref 150–400)
RBC: 3.96 MIL/uL (ref 3.87–5.11)
RDW: 14.4 % (ref 11.5–15.5)
WBC Count: 2.2 10*3/uL — ABNORMAL LOW (ref 4.0–10.5)
nRBC: 0 % (ref 0.0–0.2)

## 2021-03-29 MED ORDER — DIPHENHYDRAMINE HCL 50 MG/ML IJ SOLN
25.0000 mg | Freq: Once | INTRAMUSCULAR | Status: AC
  Administered 2021-03-29: 25 mg via INTRAVENOUS
  Filled 2021-03-29: qty 1

## 2021-03-29 MED ORDER — FAMOTIDINE 20 MG IN NS 100 ML IVPB
20.0000 mg | Freq: Once | INTRAVENOUS | Status: AC
  Administered 2021-03-29: 20 mg via INTRAVENOUS
  Filled 2021-03-29: qty 100

## 2021-03-29 MED ORDER — SODIUM CHLORIDE 0.9 % IV SOLN
127.4000 mg | Freq: Once | INTRAVENOUS | Status: AC
  Administered 2021-03-29: 130 mg via INTRAVENOUS
  Filled 2021-03-29: qty 13

## 2021-03-29 MED ORDER — SODIUM CHLORIDE 0.9 % IV SOLN: Freq: Once | INTRAVENOUS | Status: AC

## 2021-03-29 MED ORDER — SODIUM CHLORIDE 0.9 % IV SOLN
50.0000 mg/m2 | Freq: Once | INTRAVENOUS | Status: AC
  Administered 2021-03-29: 78 mg via INTRAVENOUS
  Filled 2021-03-29: qty 13

## 2021-03-29 MED ORDER — DIPHENHYDRAMINE HCL 50 MG/ML IJ SOLN: 50.0000 mg | Freq: Once | INTRAMUSCULAR | Status: DC

## 2021-03-29 MED ORDER — SODIUM CHLORIDE 0.9 % IV SOLN
10.0000 mg | Freq: Once | INTRAVENOUS | Status: AC
  Administered 2021-03-29: 10 mg via INTRAVENOUS
  Filled 2021-03-29: qty 10

## 2021-03-29 MED ORDER — PALONOSETRON HCL INJECTION 0.25 MG/5ML
0.2500 mg | Freq: Once | INTRAVENOUS | Status: AC
  Administered 2021-03-29: 0.25 mg via INTRAVENOUS
  Filled 2021-03-29: qty 5

## 2021-03-29 NOTE — Progress Notes (Signed)
Patient with restless legs last cycle post benadryl administration.  Orders received to reduce benadryl to 25 mg IV x 1 with treatment.  Plan updated.  T.O. Cira Rue, NP/Kalen Neidert Ronnald Ramp, PharmD

## 2021-03-29 NOTE — Telephone Encounter (Signed)
Left message with follow-up appointment per 11/7 los.

## 2021-03-29 NOTE — Patient Instructions (Signed)
[facility] ONCOLOGY  Discharge Instructions: Thank you for choosing [name] to provide your oncology and hematology care.   If you have a lab appointment with the [facility], please go directly to the [facility] and check in at the registration area.   Wear comfortable clothing and clothing appropriate for easy access to any Portacath or PICC line.   We strive to give you quality time with your provider. You may need to reschedule your appointment if you arrive late (15 or more minutes).  Arriving late affects you and other patients whose appointments are after yours.  Also, if you miss three or more appointments without notifying the office, you may be dismissed from the clinic at the provider's discretion.      For prescription refill requests, have your pharmacy contact our office and allow 72 hours for refills to be completed.    Today you received the following chemotherapy and/or immunotherapy agents Taxol/Carboplatin       To help prevent nausea and vomiting after your treatment, we encourage you to take your nausea medication as directed.  BELOW ARE SYMPTOMS THAT SHOULD BE REPORTED IMMEDIATELY: *FEVER GREATER THAN 100.4 F (38 C) OR HIGHER *CHILLS OR SWEATING *NAUSEA AND VOMITING THAT IS NOT CONTROLLED WITH YOUR NAUSEA MEDICATION *UNUSUAL SHORTNESS OF BREATH *UNUSUAL BRUISING OR BLEEDING *URINARY PROBLEMS (pain or burning when urinating, or frequent urination) *BOWEL PROBLEMS (unusual diarrhea, constipation, pain near the anus) TENDERNESS IN MOUTH AND THROAT WITH OR WITHOUT PRESENCE OF ULCERS (sore throat, sores in mouth, or a toothache) UNUSUAL RASH, SWELLING OR PAIN  UNUSUAL VAGINAL DISCHARGE OR ITCHING   Items with * indicate a potential emergency and should be followed up as soon as possible or go to the Emergency Department if any problems should occur.  Please show the CHEMOTHERAPY ALERT CARD or IMMUNOTHERAPY ALERT CARD at  check-in to the Emergency Department and triage nurse.  Should you have questions after your visit or need to cancel or reschedule your appointment, please contact [facility]  Dept: [phone]  and follow the prompts.  Office hours are 8:00 a.m. to 4:30 p.m. Monday - Friday. Please note that voicemails left after 4:00 p.m. may not be returned until the following business day.  We are closed weekends and major holidays. You have access to a nurse at all times for urgent questions. Please call the main number to the clinic Dept: [phone] and follow the prompts.   For any non-urgent questions, you may also contact your provider using MyChart. We now offer e-Visits for anyone 49 and older to request care online for non-urgent symptoms. For details visit [email or web address].   Also download the MyChart app! Go to the app store, search "MyChart", open the app, select [facility], and log in with your MyChart username and password.  Due to Covid, a mask is required upon entering the hospital/clinic. If you do not have a mask, one will be given to you upon arrival. For doctor visits, patients may have 1 support person aged 37 or older with them. For treatment visits, patients cannot have anyone with them due to current Covid guidelines and our immunocompromised population.

## 2021-03-29 NOTE — Progress Notes (Signed)
Nutrition Follow-up:  Patient with esophageal cancer.  Patient followed by Dr. [name] and receiving chemotherapy and radiation.    Met with patient during infusion.  Patient reports that she continues to tolerate 5 cartons of osmolite 1.5 daily (1 carton at each feeding).  Taking some liquids orally and pills in yogurt.  Reports having bowel movement daily.  Minimal nausea.      Medications: reviewed  Labs: reviewed  Anthropometrics:   Weight 126 lb 3 oz today  126 lb 4.8 oz on 10/31 123 lb 3.2 oz on 10/18 117 lb 15 oz on 10/7   Estimated Energy Needs  Kcals: 4627-0350 Protein: 72-88 g Fluid: 1.7 L  NUTRITION DIAGNOSIS: Inadequate oral intake continues but relying on feeding tube    INTERVENTION:  Continue osmolite 1.5, 5 cartons per day. Flush with 23m of water before and after each feeding.  Drink or give via tube additional 1 1/2 cups water for additional hydration.      MONITORING, EVALUATION, GOAL: weight trends, intake   NEXT VISIT: Monday, Nov 14 during infusion  Kersti Scavone B. AZenia Resides RDenison LMcGovernRegistered Dietitian [phone](mobile)

## 2021-03-30 ENCOUNTER — Ambulatory Visit
Admission: RE | Admit: 2021-03-30 | Discharge: 2021-03-30 | Disposition: A | Discharge status: Home / Self Care | Payer: Medicare Other | Source: Ambulatory Visit | Attending: Radiation Oncology | Admitting: Radiation Oncology

## 2021-03-30 ENCOUNTER — Other Ambulatory Visit: Payer: Self-pay | Admitting: Nurse Practitioner

## 2021-03-30 DIAGNOSIS — C153 Malignant neoplasm of upper third of esophagus: Secondary | ICD-10-CM | POA: Diagnosis not present

## 2021-03-30 DIAGNOSIS — Z510 Encounter for antineoplastic radiation therapy: Secondary | ICD-10-CM | POA: Diagnosis not present

## 2021-03-30 MED ORDER — SONAFINE EX EMUL
1.0000 "application " | Freq: Two times a day (BID) | CUTANEOUS | Status: DC
  Administered 2021-03-30: 1 via TOPICAL

## 2021-03-31 ENCOUNTER — Ambulatory Visit
Admission: RE | Admit: 2021-03-31 | Discharge: 2021-03-31 | Disposition: A | Discharge status: Home / Self Care | Payer: Medicare Other | Source: Ambulatory Visit | Attending: Radiation Oncology | Admitting: Radiation Oncology

## 2021-03-31 ENCOUNTER — Other Ambulatory Visit: Payer: Self-pay

## 2021-03-31 ENCOUNTER — Ambulatory Visit: Payer: Medicare Other

## 2021-03-31 DIAGNOSIS — Z510 Encounter for antineoplastic radiation therapy: Secondary | ICD-10-CM | POA: Diagnosis not present

## 2021-03-31 DIAGNOSIS — C153 Malignant neoplasm of upper third of esophagus: Secondary | ICD-10-CM | POA: Diagnosis not present

## 2021-04-01 ENCOUNTER — Inpatient Hospital Stay: Payer: Medicare Other

## 2021-04-01 ENCOUNTER — Inpatient Hospital Stay (HOSPITAL_BASED_OUTPATIENT_CLINIC_OR_DEPARTMENT_OTHER): Payer: Medicare Other | Admitting: Physician Assistant

## 2021-04-01 ENCOUNTER — Ambulatory Visit
Admission: RE | Admit: 2021-04-01 | Discharge: 2021-04-01 | Disposition: A | Discharge status: Home / Self Care | Payer: Medicare Other | Source: Ambulatory Visit | Attending: Radiation Oncology | Admitting: Radiation Oncology

## 2021-04-01 ENCOUNTER — Telehealth: Payer: Self-pay

## 2021-04-01 VITALS — BP 127/75 | HR 75 | Temp 97.5°F | Resp 18

## 2021-04-01 DIAGNOSIS — I10 Essential (primary) hypertension: Secondary | ICD-10-CM | POA: Diagnosis not present

## 2021-04-01 DIAGNOSIS — C153 Malignant neoplasm of upper third of esophagus: Secondary | ICD-10-CM

## 2021-04-01 DIAGNOSIS — Z8041 Family history of malignant neoplasm of ovary: Secondary | ICD-10-CM | POA: Diagnosis not present

## 2021-04-01 DIAGNOSIS — G500 Trigeminal neuralgia: Secondary | ICD-10-CM

## 2021-04-01 DIAGNOSIS — R634 Abnormal weight loss: Secondary | ICD-10-CM | POA: Diagnosis not present

## 2021-04-01 DIAGNOSIS — R1310 Dysphagia, unspecified: Secondary | ICD-10-CM | POA: Diagnosis not present

## 2021-04-01 DIAGNOSIS — Z806 Family history of leukemia: Secondary | ICD-10-CM | POA: Diagnosis not present

## 2021-04-01 DIAGNOSIS — Z510 Encounter for antineoplastic radiation therapy: Secondary | ICD-10-CM | POA: Diagnosis not present

## 2021-04-01 DIAGNOSIS — R042 Hemoptysis: Secondary | ICD-10-CM | POA: Diagnosis not present

## 2021-04-01 DIAGNOSIS — Z808 Family history of malignant neoplasm of other organs or systems: Secondary | ICD-10-CM | POA: Diagnosis not present

## 2021-04-01 DIAGNOSIS — R490 Dysphonia: Secondary | ICD-10-CM | POA: Diagnosis not present

## 2021-04-01 DIAGNOSIS — L598 Other specified disorders of the skin and subcutaneous tissue related to radiation: Secondary | ICD-10-CM | POA: Diagnosis not present

## 2021-04-01 DIAGNOSIS — Z5111 Encounter for antineoplastic chemotherapy: Secondary | ICD-10-CM | POA: Diagnosis not present

## 2021-04-01 LAB — CBC WITH DIFFERENTIAL (CANCER CENTER ONLY)
Abs Immature Granulocytes: 0.01 10*3/uL (ref 0.00–0.07)
Basophils Absolute: 0 10*3/uL (ref 0.0–0.1)
Basophils Relative: 0 %
Eosinophils Absolute: 0 10*3/uL (ref 0.0–0.5)
Eosinophils Relative: 0 %
HCT: 36.1 % (ref 36.0–46.0)
Hemoglobin: 11.7 g/dL — ABNORMAL LOW (ref 12.0–15.0)
Immature Granulocytes: 0 %
Lymphocytes Relative: 8 %
Lymphs Abs: 0.2 10*3/uL — ABNORMAL LOW (ref 0.7–4.0)
MCH: 29.5 pg (ref 26.0–34.0)
MCHC: 32.4 g/dL (ref 30.0–36.0)
MCV: 91.2 fL (ref 80.0–100.0)
Monocytes Absolute: 0.2 10*3/uL (ref 0.1–1.0)
Monocytes Relative: 6 %
Neutro Abs: 2.4 10*3/uL (ref 1.7–7.7)
Neutrophils Relative %: 86 %
Platelet Count: 169 10*3/uL (ref 150–400)
RBC: 3.96 MIL/uL (ref 3.87–5.11)
RDW: 14.6 % (ref 11.5–15.5)
WBC Count: 2.8 10*3/uL — ABNORMAL LOW (ref 4.0–10.5)
nRBC: 0 % (ref 0.0–0.2)

## 2021-04-01 LAB — CMP (CANCER CENTER ONLY)
ALT: 15 U/L (ref 0–44)
AST: 14 U/L — ABNORMAL LOW (ref 15–41)
Albumin: 3.2 g/dL — ABNORMAL LOW (ref 3.5–5.0)
Alkaline Phosphatase: 73 U/L (ref 38–126)
Anion gap: 7 (ref 5–15)
BUN: 23 mg/dL (ref 8–23)
CO2: 31 mmol/L (ref 22–32)
Calcium: 9.4 mg/dL (ref 8.9–10.3)
Chloride: 100 mmol/L (ref 98–111)
Creatinine: 0.71 mg/dL (ref 0.44–1.00)
GFR, Estimated: >60 mL/min (ref >60)
Glucose, Bld: 139 mg/dL — ABNORMAL HIGH (ref 70–99)
Potassium: 4.5 mmol/L (ref 3.5–5.1)
Sodium: 138 mmol/L (ref 135–145)
Total Bilirubin: 0.8 mg/dL (ref 0.3–1.2)
Total Protein: 6.1 g/dL — ABNORMAL LOW (ref 6.5–8.1)

## 2021-04-01 MED ORDER — PREDNISONE 20 MG PO TABS: 20.0000 mg | ORAL_TABLET | Freq: Every day | ORAL | 0 refills | Status: AC

## 2021-04-01 NOTE — Patient Instructions (Signed)
-  Short burst of prednisone prescribed to try to help with your trigeminal neuralgia pain.  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  You are already prescribed Carafate so you can try taking that to help with acid build up. Another over the counter option would be maalox.   If you are cough up blood clots, a concerning amount of blood or develop chest pain or shortness of breath call 911 or go to ER.

## 2021-04-01 NOTE — Progress Notes (Signed)
Symptom Management Consult note [place]    Patient Care Team: [name], [name], MD as PCP - General [name], MD as PCP - Cardiology (Cardiology) Cindie Crumbly (Neurosurgery) Irine Seal, MD as Attending Physician (Urology) Erline Levine, MD as Consulting Physician (Neurosurgery) [name], MD as Consulting Physician (Neurology) Viona Gilmore, [facility] as Pharmacist (Pharmacist) [name], MD as Consulting Physician (Gastroenterology) Carol Ada, MD as Consulting Physician (Gastroenterology) [name], MD as Consulting Physician (Otolaryngology) [name], MD as Consulting Physician (Hematology) Kyung Rudd, MD as Consulting Physician (Radiation Oncology)    Name of the patient: [name]  [id number]  1940-07-06   Date of visit: 04/01/2021    Chief complaint/ Reason for visit- coughing up blood  Oncology History  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  The base of tongue vallecula and epiglottis were normal.    Piriform sinuses were clear bilaterally and vocal cords were clear with  normal vocal mobility.  No structural abnormalities noted.   01/22/2021 Imaging   DG esophagus IMPRESSION: 1. Luminal narrowing in the high cervical esophagus over a 3 cm segment is highly concerning for esophageal neoplasm. Inflammatory process would be a secondary consideration. Luminal narrowing occurs approximately at the C5-C7 vertebral body level just below the glottis. Patient experienced several episodes of choking related to this luminal narrowing. Recommend expedient upper GI endoscopy for evaluation. 2. Distal thoracic esophagus and GE junction appear normal.   02/04/2021 Procedure   EGD by Dr. Carlean Purl impression- One mass-like severe stenosis was found in the upper third of the esophagus.  The stenosis was not traversed.   02/05/2021 Imaging   CT soft tissue neck w contrast IMPRESSION: Mass-like soft tissue thickening and mucosal hyperenhancement of the cervical esophagus and hypopharynx, spanning the C4-T1 levels, likely reflecting an esophageal malignancy. This measures up to 2.4 x 3.4 cm in transaxial dimensions, and 6.3 cm in craniocaudal dimension. Associated severe effacement of the esophageal lumen.   Centrally necrotic right paratracheal lymph node beneath the level of the mass, measuring 1.3 x 0.9 cm, and likely reflecting a site of nodal metastatic disease.   Additional lymph nodes along the posterior and inferior aspect of the right thyroid lobe (along the right aspect of the esophageal mass), which measure subcentimeter but are asymmetrically prominent and highly suspicious for additional sites of nodal metastatic disease.   02/12/2021 Pathology Results   FINAL MICROSCOPIC DIAGNOSIS:  A. ESOPHAGUS, UPPER CERVICAL #1, BIOPSY:  - Squamous cell carcinoma.  B. ESOPHAGUS, UPPER CERVICAL #2, BIOPSY:  - Squamous cell carcinoma.    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   Markedly hypermetabolic right upper paratracheal lymph node, compatible with metastatic disease.   Small right upper paratracheal and right level IIA lymph nodes with mild hypermetabolic activity, concerning for additional sites of nodal metastatic disease.   Aortic Atherosclerosis (ICD10-I70.0).   03/09/2021 -  Chemotherapy   Patient is on Treatment Plan : ESOPHAGUS  Carboplatin/PACLitaxel weekly x 6 weeks with XRT         Current Therapy: carboplatin and taxol with concurrent radiation. Last chemo 03/29/21 and radiation today.  Interval history- Angela Raven. Barrera is an 80 yo female with history as listed above presenting to [facility] today with chief complaint of coughing up blood x1 day.  Patient states that yesterday she coughed and spit up dark yellow sputum with a few noticeable streaks of blood.  She states this morning she coughed up sputum again and describes it as half dark yellow and half bright red blood.  Denies seeing any blood clots.  This is happened a total of 4 times.  Patient denies any recent emesis.  She did admit to feeling nauseous and having pain overnight that resolved after taking Compazine and Tylenol.  Patient is tolerating feeds and water through her feeding tube without difficulty.  She grinds her medications up and mixes them with yogurt and is able to take them by mouth.  She also drinks a cup of coffee every morning.  She admits to having chills last night when going to bed that have since resolved.  Denies any fever, congestion, vomiting, abdominal pain, urinary symptoms, diarrhea.    ROS  All other systems are reviewed and are negative for acute change except as noted in the HPI.    Allergies  Allergen Reactions   Hydrocodone Other (See Comments)    Rebound headaches   Sulfamethoxazole-Trimethoprim Hives and Itching    REACTION: unspecified     Past Medical History:  Diagnosis Date   Abdominal pain 05/29/2013   s/p rx of cephalo resistant e coli   but last rx NG  now residular ?  bladder sx repeat cx sx rx to ty and uro consult    ADJ DISORDER WITH MIXED ANXIETY & DEPRESSED MOOD 03/03/2010   Qualifier: Diagnosis of  By: [name], [name]    Agent resistant to multiple antibiotics 05/29/2013   e coli   bu NG on fu.     Anemia    Anxiety    ARF (acute renal failure) ([place]) 03/12/2015   Cancer ([place])    Closed head  injury 02/01/2011   from syncope and had scalp laceration  neg ct .     Closed head injury 5-6 yrs ago   Colitis 38/25/0539   Complication of anesthesia    migraine several hours after general anesthesia   Depression    Fatty liver    Gall stones 2016   see ct scan neg HIDA    GERD (gastroesophageal reflux disease)    Hearing aid worn    HOH (hard of hearing)    both ears   Hyperlipidemia    Hypertension    echo nl lv function  mild dilitation 2009   Kidney infection    few yrs ago in hospital   Medication side effect 09/02/2010   Poss muscle se of 10 crestor    Migraine    hypnic HA eval by Dr. 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    Pyelonephritis 03/12/2015   Sensation of pain in anesthetized distribution of trigeminal nerve    Syncope 02/01/2011   In shower on vacation  sustained head laceration  8 sutures Had ed visit neg head ct labs and x ray    Trigeminal neuralgia pain      Past Surgical History:  Procedure Laterality Date  ABDOMINAL HYSTERECTOMY  2002   tubal   BACK SURGERY     2 times, for sciatic nerve pain   CARDIAC CATHETERIZATION  2000   chest pains neg   CHOLECYSTECTOMY N/A 02/21/2017   Procedure: LAPAROSCOPIC CHOLECYSTECTOMY WITH INTRAOPERATIVE CHOLANGIOGRAM;  Surgeon: Armandina Gemma, MD;  Location: [facility];  Service: General;  Laterality: N/A;   COLONOSCOPY     multiple   CRANIOTOMY  12/09/2011   nerve decompression right trigeminal    DIRECT LARYNGOSCOPY N/A 02/12/2021   Procedure: DIRECT LARYNGOSCOPY AND BIOPSY POSSIBLE FROZEN;  Surgeon: [name], MD;  Location: [facility];  Service: ENT;  Laterality: N/A;   DOPPLER ECHOCARDIOGRAPHY  2009   nl lv function mild lv dilitation   EYE SURGERY Bilateral    ioc for catatracts   GASTROSTOMY N/A 02/26/2021   Procedure: OPEN GASTROSTOMY TUBE PLACEMENT;  Surgeon: Armandina Gemma, MD;  Location: [facility];  Service: General;  Laterality: N/A;   IR  GASTROSTOMY TUBE MOD SED  02/22/2021   laparoscopic gallbladder surgery  02/16/2017   Fax from [place] Bilateral 2002   rt shoulder surgery      Social History   Socioeconomic History   Marital status: Married    Spouse name: Not on file   Number of children: 2   Years of education: Not on file   Highest education level: Not on file  Occupational History    Comment: retired Forensic psychologist  Tobacco Use   Smoking status: Never   Smokeless tobacco: Never  Vaping Use   Vaping Use: Never used  Substance and Sexual Activity   Alcohol use: Not Currently    Alcohol/week: 2.0 standard drinks    Types: 2 Glasses of wine per week    Comment: occ wine   Drug use: No   Sexual activity: Not on file  Other Topics Concern   Not on file  Social History Narrative   Married   HH of 2-3 (god daughter)   Pets 2 dogs   Non smoker    Child is a physician   G2P2      Caffeine: 2 cups/day   Social Determinants of Radio broadcast assistant Strain: Low Risk    Difficulty of Paying Living Expenses: Not hard at all  Food Insecurity: Not on file  Transportation Needs: No Transportation Needs   Lack of Transportation (Medical): No   Lack of Transportation (Non-Medical): No  Physical Activity: Not on file  Stress: Not on file  Social Connections: Not on file  Intimate Partner Violence: Not on file    Family History  Problem Relation Age of Onset   Cancer Mother        uterine   Ovarian cancer Mother    Stroke Mother    Alcohol abuse Father    Stroke Father    Diabetes Brother    Cancer Paternal Aunt        leukemia, unknown type   Seizures Daughter    Hypertension Other    Colon cancer Neg Hx    Dementia Neg Hx    Alzheimer's disease Neg Hx      Current Outpatient Medications:    predniSONE (DELTASONE) 20 MG tablet, Take 1 tablet (20 mg total) by mouth daily with breakfast for 5 days., Disp: 5 tablet, Rfl: 0   acetaminophen (TYLENOL) 325  MG tablet, Take 650 mg by mouth every 6 (six) hours as needed for moderate pain.,  Disp: , Rfl:    alendronate (FOSAMAX) 70 MG tablet, TAKE 1 TABLET BY MOUTH 1 TIME A WEEK WITH FULL GLASS OF WATER AND ON AN EMPTY STOMACH AS DIRECTED, Disp: 4 tablet, Rfl: 2   Calcium Carb-Cholecalciferol (CALCIUM 600+D) 600-800 MG-UNIT TABS, Take 1 tablet by mouth 2 (two) times daily., Disp: , Rfl:    carvedilol (COREG) 25 MG tablet, TAKE 1 TABLET(25 MG) BY MOUTH TWICE DAILY WITH A MEAL, Disp: 60 tablet, Rfl: 3   conjugated estrogens (PREMARIN) vaginal cream, Place 1 applicator vaginally 2 (two) times a week., Disp: , Rfl:    doxycycline (VIBRAMYCIN) 100 MG capsule, Take 100 mg by mouth 2 (two) times daily., Disp: , Rfl:    fluticasone (FLONASE) 50 MCG/ACT nasal spray, Place 2 sprays into both nostrils as needed for allergies or rhinitis., Disp: 16 g, Rfl: 5   furosemide (LASIX) 40 MG tablet, Take 1 tablet (40 mg total) by mouth daily., Disp: 30 tablet, Rfl: 3   lamotrigine (LAMICTAL) 25 MG disintegrating tablet, Dissolve 2 tablets (50 mg) under tongue twice daily along with 100 mg for total dose of 150 mg twice daily., Disp: 120 tablet, Rfl: 3   lamoTRIgine 100 MG TBDP, Dissolve 1 tablet (100 mg) under tongue twice daily along with 50 mg for total dose of 150 mg twice daily., Disp: 60 tablet, Rfl: 3   Lisinopril 1 MG/ML SOLN, Take 20 mg  daily Per g tube, Disp: 600 mL, Rfl: 2   LORazepam (ATIVAN) 0.5 MG tablet, Take 1 tablet by mouth as needed 30-60 minutes before radiation for anxiety, Disp: 30 tablet, Rfl: 0   magic mouthwash w/lidocaine SOLN, Take 5 mLs by mouth every 6 (six) hours as needed for mouth pain., Disp: 480 mL, Rfl: 1   Multiple Vitamin (MULTIVITAMIN ADULT PO), Take 1 tablet by mouth daily., Disp: , Rfl:    omeprazole (PRILOSEC) 20 MG capsule, Take 1 capsule (20 mg total) by mouth daily. (Patient taking differently: Take 20 mg by mouth daily as needed (acid reflux).), Disp: 30 capsule, Rfl: 2    ondansetron (ZOFRAN) 4 MG tablet, Take 1 tablet (4 mg total) by mouth daily as needed for nausea or vomiting., Disp: 30 tablet, Rfl: 1   ondansetron (ZOFRAN) 8 MG tablet, Take 1 tablet (8 mg total) by mouth 2 (two) times daily as needed for refractory nausea / vomiting. Start on day 3 after chemo., Disp: 30 tablet, Rfl: 1   ondansetron (ZOFRAN-ODT) 4 MG disintegrating tablet, Take 1-2 tablets (4-8 mg total) by mouth every 8 (eight) hours as needed for nausea., Disp: 60 tablet, Rfl: 3   OXcarbazepine (TRILEPTAL) 300 MG/5ML suspension, Take 2.5 mLs (150 mg total) by mouth 2 (two) times daily., Disp: 250 mL, Rfl: 3   Polyethyl Glycol-Propyl Glycol (SYSTANE) 0.4-0.3 % GEL ophthalmic gel, Place 1 application into both eyes 2 (two) times daily as needed (dry eyes)., Disp: , Rfl:    prochlorperazine (COMPAZINE) 10 MG tablet, Take 1 tablet (10 mg total) by mouth every 6 (six) hours as needed (Nausea or vomiting)., Disp: 30 tablet, Rfl: 1   sucralfate (CARAFATE) 1 g tablet, Take 1 tablet (1 g total) by mouth 4 (four) times daily. Dissolve each tablet in 15 cc water before use., Disp: 120 tablet, Rfl: 2  PHYSICAL EXAM: ECOG FS:1 - Symptomatic but completely ambulatory    Vitals:   04/01/21 1315  BP: 127/75  Pulse: 75  Resp: 18  Temp: (!) 97.5 F (36.4 C)  TempSrc: Oral  SpO2: 98%   Physical Exam Vitals and nursing note reviewed.  Constitutional:      Appearance: She is well-developed. She is not ill-appearing or toxic-appearing.  HENT:     Head: Normocephalic and atraumatic.     Right Ear: External ear normal.     Left Ear: External ear normal.     Nose: Nose normal.     Mouth/Throat:     Mouth: Mucous membranes are moist. No oral lesions or angioedema.     Tongue: No lesions.     Palate: No mass and lesions.     Pharynx: Oropharynx is clear. Uvula midline. No pharyngeal swelling or posterior oropharyngeal erythema.  Eyes:     General: No scleral icterus.       Right eye: No discharge.         Left eye: No discharge.     Conjunctiva/sclera: Conjunctivae normal.  Neck:     Vascular: No JVD.  Cardiovascular:     Rate and Rhythm: Normal rate and regular rhythm.     Pulses: Normal pulses.     Heart sounds: Normal heart sounds.  Pulmonary:     Effort: Pulmonary effort is normal. No respiratory distress.     Breath sounds: Normal breath sounds. No stridor. No wheezing, rhonchi or rales.  Abdominal:     General: There is no distension.     Comments: No tenderness to palpation of skin around feeding tube. Faint erythema seen around stitch. No purulent drainage.  Musculoskeletal:        General: Normal range of motion.     Cervical back: Normal range of motion.  Skin:    General: Skin is warm and dry.  Neurological:     Mental Status: She is oriented to person, place, and time.     GCS: GCS eye subscore is 4. GCS verbal subscore is 5. GCS motor subscore is 6.     Comments: Fluent speech, no facial droop.  Psychiatric:        Behavior: Behavior normal.       LABORATORY DATA: I have reviewed the data as listed CBC Latest Ref Rng & Units 04/01/2021 03/29/2021 03/24/2021  WBC 4.0 - 10.5 K/uL 2.8(L) 2.2(L) 2.5(LL)  Hemoglobin 12.0 - 15.0 g/dL 11.7(L) 11.8(L) 12.1  Hematocrit 36.0 - 46.0 % 36.1 35.7(L) 35.4  Platelets 150 - 400 K/uL 169 199 194     CMP Latest Ref Rng & Units 04/01/2021 03/29/2021 03/24/2021  Glucose 70 - 99 mg/dL 139(H) 140(H) 99  BUN 8 - 23 mg/dL 23 19 26   Creatinine 0.44 - 1.00 mg/dL 0.71 0.73 0.75  Sodium 135 - 145 mmol/L 138 137 136  Potassium 3.5 - 5.1 mmol/L 4.5 4.6 4.8  Chloride 98 - 111 mmol/L 100 101 96  CO2 22 - 32 mmol/L 31 29 29   Calcium 8.9 - 10.3 mg/dL 9.4 9.4 9.4  Total Protein 6.5 - 8.1 g/dL 6.1(L) 6.3(L) 6.2  Total Bilirubin 0.3 - 1.2 mg/dL 0.8 0.6 0.4  Alkaline Phos 38 - 126 U/L 73 75 85  AST 15 - 41 U/L 14(L) 15 21  ALT 0 - 44 U/L 15 16 17        RADIOGRAPHIC STUDIES: I have personally reviewed the radiological images as  listed and agreed with the findings in the report. No images are attached to the encounter. No results found.   ASSESSMENT & PLAN: Patient is a 80 y.o. 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 [name].  #) Hemoptysis- Vitals signs stable. Exam benign. CBC shows stable hemoglobin 11.7 which is consistent with baseline and leukopenia similar to previous as well.  CMP without acute findings. 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 Patient haas no concerning symptoms of shortness of breath of chest pain. Not on a blood thinner or NSAID, does not consume alcohol. Patient coughed here in office and no blood seen. Patient has prescription for carafate from rad onc. She takes Maalox at home, recommended continue use of that and consider carafate if not improving. No indications today for emergent imaging. Patient and spouse agreeable with plan.  #) trigeminal neuralgia- known diagnosis for patient. Pain has worsened since starting treatment. Neurologist recently increased lamotrigine, also on oxcarbazepine. Will prescribed short burst of prednisone to help with worsening break through pain.  #) Squamous cell carcinoma of cervical esophagus- continue treatment per medical oncologist and radiation oncologist.    Discussed with oncologist Dr. [name] who agrees with plan.  Visit Diagnosis: 1. Cough with hemoptysis   2. Malignant neoplasm of upper third esophagus (HCC)   3. Trigeminal neuralgia      No orders of the defined types were placed in this encounter.   All questions were answered. The patient knows to call the clinic with any problems, questions or concerns. No barriers to learning was detected.  I have spent a total of 30 minutes minutes of face-to-face and non-face-to-face time, preparing to see the patient, obtaining and/or  reviewing separately obtained history, performing a medically appropriate examination, counseling and educating the patient, ordering tests,  documenting clinical information in the electronic health record, and care coordination.     Thank you for allowing me to participate in the care of this patient.    Barrie Folk, PA-C Department of Hematology/Oncology [facility] Phone: [phone]  [phone]    04/01/2021 2:24 PM

## 2021-04-01 NOTE — Telephone Encounter (Signed)
Pt called reporting a cough producing bright red blood-tinged sputum. Pt reports it starting around 12pm yesterday 03/31/21 and having an increase this morning. Pt reports nausea, controlled by compazine, no changes in swallowing and some SOB without wheezing. Pt to come in for her radiation appointment, pt made aware to come in early to see radiation provider. This RN called pt radiation RN informing her of the situation, RN agreed with pt seeing provider. Pt and rad RN had no further questions at this time.

## 2021-04-01 NOTE — Progress Notes (Signed)
Patient called stating that she was vomiting up bright red blood. Dr. Lisbeth Renshaw states that she should be  seen in symptom management today. Notified Nurse in symptom management and Nurse reached out to patient to schedule an appointment for today

## 2021-04-02 ENCOUNTER — Inpatient Hospital Stay: Payer: Medicare Other

## 2021-04-02 ENCOUNTER — Telehealth: Payer: Self-pay | Admitting: Neurology

## 2021-04-02 ENCOUNTER — Other Ambulatory Visit: Payer: Self-pay

## 2021-04-02 ENCOUNTER — Ambulatory Visit
Admission: RE | Admit: 2021-04-02 | Discharge: 2021-04-02 | Disposition: A | Discharge status: Home / Self Care | Payer: Medicare Other | Source: Ambulatory Visit | Attending: Radiation Oncology | Admitting: Radiation Oncology

## 2021-04-02 DIAGNOSIS — C153 Malignant neoplasm of upper third of esophagus: Secondary | ICD-10-CM | POA: Diagnosis not present

## 2021-04-02 DIAGNOSIS — Z510 Encounter for antineoplastic radiation therapy: Secondary | ICD-10-CM | POA: Diagnosis not present

## 2021-04-02 MED ORDER — BUTORPHANOL TARTRATE 10 MG/ML NA SOLN: 1.0000 | NASAL | 0 refills | Status: DC | PRN

## 2021-04-02 MED FILL — Dexamethasone Sodium Phosphate Inj 100 MG/10ML: INTRAMUSCULAR | Qty: 1 | Status: AC

## 2021-04-02 NOTE — Telephone Encounter (Signed)
Patients oncologist, dr Lisbeth Renshaw, had neuro on -call paged to initiate treatment. Couldn't cal the oncologist back, he only provided a number for the patient at home. Called patient who reports sever trigeminal pain, and is expecting a neurosurgical intervention.In the meantime, this oesophageal cancer patient needs a bridge over. Currently on 200 mg Lamictal bid with limited success ( per patients report) epic stated 150 mg bid  dissolvable tablets for this medication bid.   Also listed is TRILEPTAL/ Oxcarbazepine 150 mg bid. Liquid.  I offered Stadol nasal spray for relief.

## 2021-04-05 ENCOUNTER — Ambulatory Visit: Payer: Medicare Other | Admitting: Radiation Oncology

## 2021-04-05 ENCOUNTER — Inpatient Hospital Stay: Payer: Medicare Other | Admitting: Hematology

## 2021-04-05 ENCOUNTER — Ambulatory Visit
Admission: RE | Admit: 2021-04-05 | Discharge: 2021-04-05 | Disposition: A | Discharge status: Home / Self Care | Payer: Medicare Other | Source: Ambulatory Visit | Attending: Radiation Oncology | Admitting: Radiation Oncology

## 2021-04-05 ENCOUNTER — Encounter: Payer: Self-pay | Admitting: Hematology

## 2021-04-05 ENCOUNTER — Ambulatory Visit (HOSPITAL_BASED_OUTPATIENT_CLINIC_OR_DEPARTMENT_OTHER): Payer: Medicare Other | Admitting: Cardiology

## 2021-04-05 ENCOUNTER — Ambulatory Visit: Payer: Medicare Other

## 2021-04-05 ENCOUNTER — Other Ambulatory Visit: Payer: Self-pay

## 2021-04-05 ENCOUNTER — Inpatient Hospital Stay: Payer: Medicare Other

## 2021-04-05 VITALS — BP 157/83 | HR 72 | Temp 97.5°F | Wt 124.6 lb

## 2021-04-05 VITALS — BP 128/74

## 2021-04-05 DIAGNOSIS — Z806 Family history of leukemia: Secondary | ICD-10-CM | POA: Diagnosis not present

## 2021-04-05 DIAGNOSIS — Z808 Family history of malignant neoplasm of other organs or systems: Secondary | ICD-10-CM | POA: Diagnosis not present

## 2021-04-05 DIAGNOSIS — C153 Malignant neoplasm of upper third of esophagus: Secondary | ICD-10-CM

## 2021-04-05 DIAGNOSIS — Z5111 Encounter for antineoplastic chemotherapy: Secondary | ICD-10-CM | POA: Diagnosis not present

## 2021-04-05 DIAGNOSIS — R634 Abnormal weight loss: Secondary | ICD-10-CM | POA: Diagnosis not present

## 2021-04-05 DIAGNOSIS — I10 Essential (primary) hypertension: Secondary | ICD-10-CM | POA: Diagnosis not present

## 2021-04-05 DIAGNOSIS — L598 Other specified disorders of the skin and subcutaneous tissue related to radiation: Secondary | ICD-10-CM | POA: Diagnosis not present

## 2021-04-05 DIAGNOSIS — R042 Hemoptysis: Secondary | ICD-10-CM | POA: Diagnosis not present

## 2021-04-05 DIAGNOSIS — Z8041 Family history of malignant neoplasm of ovary: Secondary | ICD-10-CM | POA: Diagnosis not present

## 2021-04-05 DIAGNOSIS — R490 Dysphonia: Secondary | ICD-10-CM | POA: Diagnosis not present

## 2021-04-05 DIAGNOSIS — G500 Trigeminal neuralgia: Secondary | ICD-10-CM | POA: Diagnosis not present

## 2021-04-05 DIAGNOSIS — R1310 Dysphagia, unspecified: Secondary | ICD-10-CM | POA: Diagnosis not present

## 2021-04-05 DIAGNOSIS — Z510 Encounter for antineoplastic radiation therapy: Secondary | ICD-10-CM | POA: Diagnosis not present

## 2021-04-05 LAB — CBC WITH DIFFERENTIAL (CANCER CENTER ONLY)
Abs Immature Granulocytes: 0.02 10*3/uL (ref 0.00–0.07)
Basophils Absolute: 0 10*3/uL (ref 0.0–0.1)
Basophils Relative: 0 %
Eosinophils Absolute: 0 10*3/uL (ref 0.0–0.5)
Eosinophils Relative: 0 %
HCT: 37.3 % (ref 36.0–46.0)
Hemoglobin: 12.1 g/dL (ref 12.0–15.0)
Immature Granulocytes: 1 %
Lymphocytes Relative: 12 %
Lymphs Abs: 0.3 10*3/uL — ABNORMAL LOW (ref 0.7–4.0)
MCH: 29.6 pg (ref 26.0–34.0)
MCHC: 32.4 g/dL (ref 30.0–36.0)
MCV: 91.2 fL (ref 80.0–100.0)
Monocytes Absolute: 0.1 10*3/uL (ref 0.1–1.0)
Monocytes Relative: 5 %
Neutro Abs: 2 10*3/uL (ref 1.7–7.7)
Neutrophils Relative %: 82 %
Platelet Count: 145 10*3/uL — ABNORMAL LOW (ref 150–400)
RBC: 4.09 MIL/uL (ref 3.87–5.11)
RDW: 14.9 % (ref 11.5–15.5)
WBC Count: 2.4 10*3/uL — ABNORMAL LOW (ref 4.0–10.5)
nRBC: 0 % (ref 0.0–0.2)

## 2021-04-05 LAB — CMP (CANCER CENTER ONLY)
ALT: 15 U/L (ref 0–44)
AST: 14 U/L — ABNORMAL LOW (ref 15–41)
Albumin: 3.4 g/dL — ABNORMAL LOW (ref 3.5–5.0)
Alkaline Phosphatase: 73 U/L (ref 38–126)
Anion gap: 11 (ref 5–15)
BUN: 27 mg/dL — ABNORMAL HIGH (ref 8–23)
CO2: 29 mmol/L (ref 22–32)
Calcium: 9.8 mg/dL (ref 8.9–10.3)
Chloride: 98 mmol/L (ref 98–111)
Creatinine: 0.8 mg/dL (ref 0.44–1.00)
GFR, Estimated: >60 mL/min (ref >60)
Glucose, Bld: 191 mg/dL — ABNORMAL HIGH (ref 70–99)
Potassium: 4.2 mmol/L (ref 3.5–5.1)
Sodium: 138 mmol/L (ref 135–145)
Total Bilirubin: 0.6 mg/dL (ref 0.3–1.2)
Total Protein: 6.5 g/dL (ref 6.5–8.1)

## 2021-04-05 MED ORDER — FAMOTIDINE 20 MG IN NS 100 ML IVPB
20.0000 mg | Freq: Once | INTRAVENOUS | Status: AC
  Administered 2021-04-05: 20 mg via INTRAVENOUS

## 2021-04-05 MED ORDER — SODIUM CHLORIDE 0.9 % IV SOLN: Freq: Once | INTRAVENOUS | Status: AC

## 2021-04-05 MED ORDER — DIPHENHYDRAMINE HCL 50 MG/ML IJ SOLN
25.0000 mg | Freq: Once | INTRAMUSCULAR | Status: AC
  Administered 2021-04-05: 25 mg via INTRAVENOUS

## 2021-04-05 MED ORDER — PALONOSETRON HCL INJECTION 0.25 MG/5ML
0.2500 mg | Freq: Once | INTRAVENOUS | Status: AC
  Administered 2021-04-05: 0.25 mg via INTRAVENOUS

## 2021-04-05 MED ORDER — SODIUM CHLORIDE 0.9 % IV SOLN
50.0000 mg/m2 | Freq: Once | INTRAVENOUS | Status: AC
  Administered 2021-04-05: 78 mg via INTRAVENOUS
  Filled 2021-04-05: qty 13

## 2021-04-05 MED ORDER — SODIUM CHLORIDE 0.9 % IV SOLN
10.0000 mg | Freq: Once | INTRAVENOUS | Status: AC
  Administered 2021-04-05: 10 mg via INTRAVENOUS
  Filled 2021-04-05: qty 10

## 2021-04-05 MED ORDER — SODIUM CHLORIDE 0.9 % IV SOLN
127.4000 mg | Freq: Once | INTRAVENOUS | Status: AC
  Administered 2021-04-05: 130 mg via INTRAVENOUS
  Filled 2021-04-05: qty 13

## 2021-04-05 NOTE — Progress Notes (Signed)
Nutrition Follow-up:  Patient with esophageal cancer.  Patient followed by Dr [name] and receiving chemotherapy and radiation.   Met with patient during infusion.  Patient reports that she continues to tolerate 5 cartons of osmolite 1.5 via feeding tube (1 carton 5 times per day).  Gives 35m of water before feeding and 1219mafter.  Patient is taking little by mouth at this time.  [facility]     Medications: reviewed  Labs: reviewed  Anthropometrics:   Weight 124 lb 9 oz today  126 lb 3 oz on 11/7 126 lb 4.8 oz on 10/31 123 lb 3.2 oz on 10/18 117 lb 15 oz on 10/7   Estimated Energy Needs  Kcals: 162831-5176rotein: 72-88 g Fluid: 1.7 L  NUTRITION DIAGNOSIS: Inadequate oral intake continues, relying on feeding tube   INTERVENTION:  Continue osmolite 1.5, 1 carton 5 times per day. Water flush of 6022mefore and 120m83mter 5 times per day with feeding. Patient with questions regarding giving medications via feeding tube.  Encouraged patient to review medication list with pharmacist to determine crushable medications and which medications can be changed to liquid version. Encouraged crushing medications and dissolve in water.  Flush with 30ml15mwater before and after each medication.   Provided instructions on how to remove clog in feeding tube if occurs.  Contact information given to patient     MONITORING, EVALUATION, GOAL: weight trends, tube feeding tolerance   NEXT VISIT: to be determined  Anjeanette Petzold B. AllenZenia Resides LMount Repose RPioneerstered Dietitian [phone])

## 2021-04-05 NOTE — Patient Instructions (Signed)
[facility]  Discharge Instructions: Thank you for choosing [facility] to provide your oncology and hematology care.   If you have a lab appointment with the [facility], please go directly to the [facility] and check in at the registration area.   Wear comfortable clothing and clothing appropriate for easy access to any Portacath or PICC line.   We strive to give you quality time with your provider. You may need to reschedule your appointment if you arrive late (15 or more minutes).  Arriving late affects you and other patients whose appointments are after yours.  Also, if you miss three or more appointments without notifying the office, you may be dismissed from the clinic at the provider's discretion.      For prescription refill requests, have your pharmacy contact our office and allow 72 hours for refills to be completed.    Today you received the following chemotherapy and/or immunotherapy agents: Taxol & Carboplatin   To help prevent nausea and vomiting after your treatment, we encourage you to take your nausea medication as directed.  BELOW ARE SYMPTOMS THAT SHOULD BE REPORTED IMMEDIATELY: *FEVER GREATER THAN 100.4 F (38 C) OR HIGHER *CHILLS OR SWEATING *NAUSEA AND VOMITING THAT IS NOT CONTROLLED WITH YOUR NAUSEA MEDICATION *UNUSUAL SHORTNESS OF BREATH *UNUSUAL BRUISING OR BLEEDING *URINARY PROBLEMS (pain or burning when urinating, or frequent urination) *BOWEL PROBLEMS (unusual diarrhea, constipation, pain near the anus) TENDERNESS IN MOUTH AND THROAT WITH OR WITHOUT PRESENCE OF ULCERS (sore throat, sores in mouth, or a toothache) UNUSUAL RASH, SWELLING OR PAIN  UNUSUAL VAGINAL DISCHARGE OR ITCHING   Items with * indicate a potential emergency and should be followed up as soon as possible or go to the Emergency Department if any problems should occur.  Please show the CHEMOTHERAPY ALERT CARD or IMMUNOTHERAPY ALERT CARD at  check-in to the Emergency Department and triage nurse.  Should you have questions after your visit or need to cancel or reschedule your appointment, please contact [facility]  Dept: [phone]  and follow the prompts.  Office hours are 8:00 a.m. to 4:30 p.m. Monday - Friday. Please note that voicemails left after 4:00 p.m. may not be returned until the following business day.  We are closed weekends and major holidays. You have access to a nurse at all times for urgent questions. Please call the main number to the clinic Dept: [phone] and follow the prompts.   For any non-urgent questions, you may also contact your provider using MyChart. We now offer e-Visits for anyone 69 and older to request care online for non-urgent symptoms. For details visit [email or web address].   Also download the MyChart app! Go to the app store, search "MyChart", open the app, select [facility], and log in with your MyChart username and password.  Due to Covid, a mask is required upon entering the hospital/clinic. If you do not have a mask, one will be given to you upon arrival. For doctor visits, patients may have 1 support person aged 71 or older with them. For treatment visits, patients cannot have anyone with them due to current Covid guidelines and our immunocompromised population.

## 2021-04-05 NOTE — Progress Notes (Signed)
[name]   Telephone:([phone]) [phone] Fax:([phone]   Clinic Follow up Note   Patient Care Team: [name], [name], MD as PCP - General [name], MD as PCP - Cardiology (Cardiology) Cindie Crumbly (Neurosurgery) Irine Seal, MD as Attending Physician (Urology) Erline Levine, MD as Consulting Physician (Neurosurgery) [name], MD as Consulting Physician (Neurology) Viona Gilmore, [facility] as Pharmacist (Pharmacist) [name], MD as Consulting Physician (Gastroenterology) Carol Ada, MD as Consulting Physician (Gastroenterology) [name], MD as Consulting Physician (Otolaryngology) [name], MD as Consulting Physician (Hematology) Kyung Rudd, MD as Consulting Physician (Radiation Oncology)  Date of Service:  04/05/2021  CHIEF COMPLAINT: f/u of esophageal cancer  CURRENT THERAPY:  Concurrent chemo/RT with carbo/taxol, radiation starting 03/08/21, chemo started 03/09/21   ASSESSMENT & PLAN:  [name] is a 80 y.o. female with   1. 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 Path confirmed squamous cell carcinoma of the esophagus  -She had feeding tube placed by Dr. Harlow Asa 02/26/21  -PET scan shows hypermetabolic esophageal mass and hypermetabolic paratracheal nodes, negative to distant metastasis. This is local disease  -due to the location in the cervical esophagus, Dr. Kipp Brood feels she is not a surgical candidate.  -she has mild abnormal nerve sensation in the feet, no previous diagnosis of neuropathy. Will monitor closely on taxol.  -PDL1 testing is 95% positive, predicting a good response to immunotherapy which we plan to give after chemoRT for up to 2 years. Patient is interested. She may want a port a cath at some point, will discuss another  day. -she began RT 03/08/21 and carbo/taxol 03/09/21, tolerating mostly well.  S/p week 4 she has skin redness, hoarse voice, and mild dysphagia (pain rated 4/10, she is on liquid telynol) -lab reviewed, adequate for treatment today  -F/up weekly on chemoRT   2. Dysphagia, weight loss  -Secondary to #1 -She has not tolerated solids lately, only pureed or liquid diet with difficulty. Can change meds to liquid as applicable  -She underwent surgical placement of feeding tube by Dr. Harlow Asa on 02/26/21 -tolerating 5 bolus feeds per day + 120 ml water q3 hours -no signs of refeeding syndrome. -She completed a course of prophylactic doxycycline per Dr. Harlow Asa for mild drainage (since 03/12/21); no current signs of infection   3. Trigeminal neuralgia, sciatica, headaches -On lamotrigene and oxcarbazepine  -pain has worsened lately, I reached out to Dr. Jaynee Eagles who had recently adjusted meds -she has also been contacted by neurology, who prescribed Stadol nasal spray.   4. HTN -continue benazepril, furosemide -benazepril reduced to once daily from 03/09/21, and let her know to hold lasix if she becomes dizzy at home   -monitoring closely    5. Goals of Care -Code status was DNR from hospitalization 05/16/20, changed to full code during 02/26/2021 hospitalization for feeding tube placement   -goal of chemoRT is curative    PLAN: -proceed to radiation and continue daily -will proceed to week 5 carbo/taxol today -lab, f/u, and week 6 carbo/taxol on 11/21 or 11/22 (last dose)   No problem-specific Assessment & Plan notes found for this encounter.   SUMMARY OF ONCOLOGIC HISTORY: Oncology History  Malignant neoplasm of upper third esophagus ([name])  12/21/2020 Imaging   Laryngoscopy Comments:    On fiberoptic laryngoscopy through the right nostril the nasopharynx  was clear.  The base of tongue vallecula and epiglottis were normal.    Piriform sinuses were clear bilaterally and vocal cords were  clear with  normal vocal mobility.  No structural abnormalities noted.   01/22/2021 Imaging   DG esophagus IMPRESSION: 1. Luminal narrowing in the high cervical esophagus over a 3 cm segment is highly concerning for esophageal neoplasm. Inflammatory process would be a secondary consideration. Luminal narrowing occurs approximately at the C5-C7 vertebral body level just below the glottis. Patient experienced several episodes of choking related to this luminal narrowing. Recommend expedient upper GI endoscopy for evaluation. 2. Distal thoracic esophagus and GE junction appear normal.   02/04/2021 Procedure   EGD by Dr. Carlean Purl impression- One mass-like severe stenosis was found in the upper third of the esophagus. The stenosis was not traversed.   02/05/2021 Imaging   CT soft tissue neck w contrast IMPRESSION: Mass-like soft tissue thickening and mucosal hyperenhancement of the cervical esophagus and hypopharynx, spanning the C4-T1 levels, likely reflecting an esophageal malignancy. This measures up to 2.4 x 3.4 cm in transaxial dimensions, and 6.3 cm in craniocaudal dimension. Associated severe effacement of the esophageal lumen.   Centrally necrotic right paratracheal lymph node beneath the level of the mass, measuring 1.3 x 0.9 cm, and likely reflecting a site of nodal metastatic disease.   Additional lymph nodes along the posterior and inferior aspect of the right thyroid lobe (along the right aspect of the esophageal mass), which measure subcentimeter but are asymmetrically prominent and highly suspicious for additional sites of nodal metastatic disease.   02/12/2021 Pathology Results   FINAL MICROSCOPIC DIAGNOSIS:  A. ESOPHAGUS, UPPER CERVICAL #1, BIOPSY:  - Squamous cell carcinoma.  B. ESOPHAGUS, UPPER CERVICAL #2, BIOPSY:  - Squamous cell carcinoma.    02/12/2021 Cancer Staging   Staging form: Esophagus - Squamous Cell Carcinoma, AJCC 8th Edition - Clinical stage from 02/12/2021: Stage  Unknown (cTX, cN2, cM0) - Signed by [name], MD on 03/08/2021 Stage prefix: Initial diagnosis    02/18/2021 Initial Diagnosis   Malignant neoplasm of upper third esophagus ([facility])   02/23/2021 PET scan   IMPRESSION: Markedly hypermetabolic focal masslike thickening of the cervical esophagus, compatible with primary esophageal malignancy.   Markedly hypermetabolic right upper paratracheal lymph node, compatible with metastatic disease.   Small right upper paratracheal and right level IIA lymph nodes with mild hypermetabolic activity, concerning for additional sites of nodal metastatic disease.   Aortic Atherosclerosis (ICD10-I70.0).   03/09/2021 -  Chemotherapy   Patient is on Treatment Plan : ESOPHAGUS Carboplatin/PACLitaxel weekly x 6 weeks with XRT          INTERVAL HISTORY:  [name] is here for a follow up of esophageal cancer. She was last seen by me on 03/22/21 and by NP Lacie and PA Kaitlyn in the interim. She presents to the clinic accompanied by her husband. She has redness around her neck from the radiation, and her voice is hoarse today. Her husband reports she is gagging a lot. She also reports she is mostly able to take her pills by mouth. She doesn't feel her swallowing is getting any worse. She denies pain at baseline, but swallowing she rates 4/10.  He also reports it seems like her trigeminal neuralgia is worse while on treatment. He notes they were given prednisone at their visit with [facility], and it seems to be helping.   All other systems were reviewed with the patient and are negative.  MEDICAL HISTORY:  Past Medical History:  Diagnosis Date   Abdominal pain 05/29/2013   s/p rx of cephalo resistant e coli   but last rx NG  now residular ?  bladder sx repeat cx sx rx to ty and uro consult    ADJ DISORDER WITH MIXED ANXIETY & DEPRESSED MOOD 03/03/2010   Qualifier: Diagnosis of  By: [name], [name]    Agent resistant to multiple antibiotics  05/29/2013   e coli   bu NG on fu.     Anemia    Anxiety    ARF (acute renal failure) ([facility]) 03/12/2015   Cancer ([place] )    Closed head injury 02/01/2011   from syncope and had scalp laceration  neg ct .     Closed head injury 5-6 yrs ago   Colitis 38/75/6433   Complication of anesthesia    migraine several hours after general anesthesia   Depression    Fatty liver    Gall stones 2016   see ct scan neg HIDA    GERD (gastroesophageal reflux disease)    Hearing aid worn    HOH (hard of hearing)    both ears   Hyperlipidemia    Hypertension    echo nl lv function  mild dilitation 2009   Kidney infection    few yrs ago in hospital   Medication side effect 09/02/2010   Poss muscle se of 10 crestor    Migraine    hypnic HA eval by Dr. 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    Pyelonephritis 03/12/2015   Sensation of pain in anesthetized distribution of trigeminal nerve    Syncope 02/01/2011   In shower on vacation  sustained head laceration  8 sutures Had ed visit neg head ct labs and x ray    Trigeminal neuralgia pain     SURGICAL HISTORY: Past Surgical History:  Procedure Laterality Date   ABDOMINAL HYSTERECTOMY  2002   tubal   BACK SURGERY     2 times, for sciatic nerve pain   CARDIAC CATHETERIZATION  2000   chest pains neg   CHOLECYSTECTOMY N/A 02/21/2017   Procedure: LAPAROSCOPIC CHOLECYSTECTOMY WITH INTRAOPERATIVE CHOLANGIOGRAM;  Surgeon: Armandina Gemma, MD;  Location: [facility];  Service: General;  Laterality: N/A;   COLONOSCOPY     multiple   CRANIOTOMY  12/09/2011   nerve decompression right trigeminal    DIRECT LARYNGOSCOPY N/A 02/12/2021   Procedure: DIRECT LARYNGOSCOPY AND BIOPSY POSSIBLE FROZEN;  Surgeon: [name], MD;  Location: [facility];  Service: ENT;  Laterality: N/A;   DOPPLER ECHOCARDIOGRAPHY  2009   nl lv function mild lv dilitation   EYE SURGERY Bilateral    ioc for catatracts   GASTROSTOMY N/A  02/26/2021   Procedure: OPEN GASTROSTOMY TUBE PLACEMENT;  Surgeon: Armandina Gemma, MD;  Location: [facility];  Service: General;  Laterality: N/A;   IR GASTROSTOMY TUBE MOD SED  02/22/2021   laparoscopic gallbladder surgery  02/16/2017   Fax from [place] [place] Bilateral 2002   rt shoulder surgery      I have reviewed the social history and family history with the patient and they are unchanged from previous note.  ALLERGIES:  is allergic to hydrocodone and sulfamethoxazole-trimethoprim.  MEDICATIONS:  Current Outpatient Medications  Medication Sig Dispense Refill   acetaminophen (TYLENOL) 325 MG tablet Take 650 mg by mouth every 6 (six) hours as needed for  moderate pain.     alendronate (FOSAMAX) 70 MG tablet TAKE 1 TABLET BY MOUTH 1 TIME A WEEK WITH FULL GLASS OF WATER AND ON AN EMPTY STOMACH AS DIRECTED 4 tablet 2   butorphanol (STADOL) 10 MG/ML nasal spray Place 1 spray into the nose every 4 (four) hours as needed for headache (trigeminal neuralgia.). 2.5 mL 0   Calcium Carb-Cholecalciferol (CALCIUM 600+D) 600-800 MG-UNIT TABS Take 1 tablet by mouth 2 (two) times daily.     carvedilol (COREG) 25 MG tablet TAKE 1 TABLET(25 MG) BY MOUTH TWICE DAILY WITH A MEAL 60 tablet 3   conjugated estrogens (PREMARIN) vaginal cream Place 1 applicator vaginally 2 (two) times a week.     doxycycline (VIBRAMYCIN) 100 MG capsule Take 100 mg by mouth 2 (two) times daily.     fluticasone (FLONASE) 50 MCG/ACT nasal spray Place 2 sprays into both nostrils as needed for allergies or rhinitis. 16 g 5   furosemide (LASIX) 40 MG tablet Take 1 tablet (40 mg total) by mouth daily. 30 tablet 3   lamotrigine (LAMICTAL) 25 MG disintegrating tablet Dissolve 2 tablets (50 mg) under tongue twice daily along with 100 mg for total dose of 150 mg twice daily. 120 tablet 3   lamoTRIgine 100 MG TBDP Dissolve 1 tablet (100 mg) under tongue twice daily along with 50 mg for total dose of 150 mg twice daily. 60  tablet 3   Lisinopril 1 MG/ML SOLN Take 20 mg  daily Per g tube 600 mL 2   LORazepam (ATIVAN) 0.5 MG tablet Take 1 tablet by mouth as needed 30-60 minutes before radiation for anxiety 30 tablet 0   magic mouthwash w/lidocaine SOLN Take 5 mLs by mouth every 6 (six) hours as needed for mouth pain. 480 mL 1   Multiple Vitamin (MULTIVITAMIN ADULT PO) Take 1 tablet by mouth daily.     omeprazole (PRILOSEC) 20 MG capsule Take 1 capsule (20 mg total) by mouth daily. (Patient taking differently: Take 20 mg by mouth daily as needed (acid reflux).) 30 capsule 2   ondansetron (ZOFRAN) 4 MG tablet Take 1 tablet (4 mg total) by mouth daily as needed for nausea or vomiting. 30 tablet 1   ondansetron (ZOFRAN) 8 MG tablet Take 1 tablet (8 mg total) by mouth 2 (two) times daily as needed for refractory nausea / vomiting. Start on day 3 after chemo. 30 tablet 1   ondansetron (ZOFRAN-ODT) 4 MG disintegrating tablet Take 1-2 tablets (4-8 mg total) by mouth every 8 (eight) hours as needed for nausea. 60 tablet 3   OXcarbazepine (TRILEPTAL) 300 MG/5ML suspension Take 2.5 mLs (150 mg total) by mouth 2 (two) times daily. 250 mL 3   Polyethyl Glycol-Propyl Glycol (SYSTANE) 0.4-0.3 % GEL ophthalmic gel Place 1 application into both eyes 2 (two) times daily as needed (dry eyes).     predniSONE (DELTASONE) 20 MG tablet Take 1 tablet (20 mg total) by mouth daily with breakfast for 5 days. 5 tablet 0   prochlorperazine (COMPAZINE) 10 MG tablet Take 1 tablet (10 mg total) by mouth every 6 (six) hours as needed (Nausea or vomiting). 30 tablet 1   sucralfate (CARAFATE) 1 g tablet Take 1 tablet (1 g total) by mouth 4 (four) times daily. Dissolve each tablet in 15 cc water before use. 120 tablet 2   No current facility-administered medications for this visit.    PHYSICAL EXAMINATION: ECOG PERFORMANCE STATUS: 2 - Symptomatic, <50% confined to bed  Vitals:  04/05/21 0822  BP: (!) 157/83  Pulse: 72  Temp: (!) 97.5 F (36.4 C)   SpO2: 97%   Wt Readings from Last 3 Encounters:  04/05/21 124 lb 9 oz (56.5 kg)  03/29/21 126 lb 3 oz (57.2 kg)  03/24/21 128 lb (58.1 kg)     GENERAL:alert, no distress and comfortable SKIN: skin color, texture, turgor are normal, no significant lesions, (+) erythema from radiation around neck and upper chest, no skin breakdown EYES: normal, Conjunctiva are pink and non-injected, sclera clear  NECK: supple, thyroid normal size, non-tender, without nodularity LUNGS: clear to auscultation and percussion with normal breathing effort HEART: regular rate & rhythm and no murmurs and no lower extremity edema NEURO: alert & oriented x 3 with fluent speech, no focal motor/sensory deficits  LABORATORY DATA:  I have reviewed the data as listed CBC Latest Ref Rng & Units 04/05/2021 04/01/2021 03/29/2021  WBC 4.0 - 10.5 K/uL 2.4(L) 2.8(L) 2.2(L)  Hemoglobin 12.0 - 15.0 g/dL 12.1 11.7(L) 11.8(L)  Hematocrit 36.0 - 46.0 % 37.3 36.1 35.7(L)  Platelets 150 - 400 K/uL 145(L) 169 199     CMP Latest Ref Rng & Units 04/05/2021 04/01/2021 03/29/2021  Glucose 70 - 99 mg/dL 191(H) 139(H) 140(H)  BUN 8 - 23 mg/dL 27(H) 23 19  Creatinine 0.44 - 1.00 mg/dL 0.80 0.71 0.73  Sodium 135 - 145 mmol/L 138 138 137  Potassium 3.5 - 5.1 mmol/L 4.2 4.5 4.6  Chloride 98 - 111 mmol/L 98 100 101  CO2 22 - 32 mmol/L 29 31 29   Calcium 8.9 - 10.3 mg/dL 9.8 9.4 9.4  Total Protein 6.5 - 8.1 g/dL 6.5 6.1(L) 6.3(L)  Total Bilirubin 0.3 - 1.2 mg/dL 0.6 0.8 0.6  Alkaline Phos 38 - 126 U/L 73 73 75  AST 15 - 41 U/L 14(L) 14(L) 15  ALT 0 - 44 U/L 15 15 16       RADIOGRAPHIC STUDIES: I have personally reviewed the radiological images as listed and agreed with the findings in the report. No results found.    No orders of the defined types were placed in this encounter.  All questions were answered. The patient knows to call the clinic with any problems, questions or concerns. No barriers to learning was detected. The  total time spent in the appointment was 30 minutes.     [name], MD 04/05/2021   I, Wilburn Mylar, am acting as scribe for [name], MD.   I have reviewed the above documentation for accuracy and completeness, and I agree with the above.

## 2021-04-06 ENCOUNTER — Ambulatory Visit: Payer: Medicare Other | Admitting: Radiation Oncology

## 2021-04-06 ENCOUNTER — Ambulatory Visit (INDEPENDENT_AMBULATORY_CARE_PROVIDER_SITE_OTHER): Payer: Medicare Other

## 2021-04-06 ENCOUNTER — Ambulatory Visit: Payer: Medicare Other

## 2021-04-06 ENCOUNTER — Ambulatory Visit
Admission: RE | Admit: 2021-04-06 | Discharge: 2021-04-06 | Disposition: A | Discharge status: Home / Self Care | Payer: Medicare Other | Source: Ambulatory Visit | Attending: Radiation Oncology | Admitting: Radiation Oncology

## 2021-04-06 VITALS — Ht 66.0 in | Wt 125.0 lb

## 2021-04-06 DIAGNOSIS — Z0000 Encounter for general adult medical examination without abnormal findings: Secondary | ICD-10-CM | POA: Diagnosis not present

## 2021-04-06 DIAGNOSIS — C153 Malignant neoplasm of upper third of esophagus: Secondary | ICD-10-CM | POA: Diagnosis not present

## 2021-04-06 DIAGNOSIS — Z510 Encounter for antineoplastic radiation therapy: Secondary | ICD-10-CM | POA: Diagnosis not present

## 2021-04-06 NOTE — Telephone Encounter (Signed)
Please call the patient.  It appears that she received Stadol spray from Dr. Brett Fairy we can inquire if this is working for her.  If not we can increase the Lamictal.  Patient reported to Dr. Brett Fairy she was taking Lamictal 200 mg twice a day.  Please verify this dose.  If she wants to  increase we can increase it to 225 twice a day

## 2021-04-06 NOTE — Telephone Encounter (Signed)
I called pt and she is hoarse, has laryngitis.  I relayed that if stadol prn is working then will leave things as they are, will not increase lamictal to 226m po bid.  Pt stated she is doing ok a this juncture.  Things will remain the same.  Appreciated call back.  Already see's NS.  MM/NP made aware.

## 2021-04-06 NOTE — Progress Notes (Signed)
I connected with [name] today by telephone and verified that I am speaking with the correct person using two identifiers. Location patient: home Location provider: work Persons participating in the virtual visit: Lawerance Cruel, provider.   I discussed the limitations, risks, security and privacy concerns of performing an evaluation and management service by telephone and the availability of in person appointments. I also discussed with the patient that there may be a patient responsible charge related to this service. The patient expressed understanding and verbally consented to this telephonic visit.    Interactive audio and video telecommunications were attempted between this provider and patient, however failed, due to patient having technical difficulties OR patient did not have access to video capability.  We continued and completed visit with audio only.     Vital signs may be patient reported or missing.  Subjective:   [name] is a 80 y.o. female who presents for Medicare Annual (Subsequent) preventive examination.  Review of Systems     Cardiac Risk Factors include: advanced age (>53mn, >>67women);hypertension     Objective:    Today's Vitals   04/06/21 1541  Weight: 125 lb (56.7 kg)  Height: 5' 6"  (1.676 m)   Body mass index is 20.18 kg/m.  Advanced Directives 04/06/2021 04/05/2021 03/29/2021 02/26/2021 02/25/2021 02/18/2021 02/18/2021  Does Patient Have a Medical Advance Directive? Yes Yes Yes Yes Yes Yes Yes  Type of AParamed[place]of [place]end[place] will [place] will Living will Healthcare Power of [place]herst Junction[place] will [place] will [place] Does patient want to make changes to medical advance directive? - No - Patient declined No - Patient declined No - Patient declined - No - Patient declined No - Patient declined  Copy of  [place] Chart? Yes - validated most recent copy scanned in chart (See row information) Yes - validated most recent copy scanned in chart (See row information) - Yes - validated most recent copy scanned in chart (See row information) - Yes - validated most recent copy scanned in chart (See row information) Yes - validated most recent copy scanned in chart (See row information)  Would patient like information on creating a medical advance directive? - No - Patient declined No - Patient declined No - Patient declined - - -    Current Medications (verified) Outpatient Encounter Medications as of 04/06/2021  Medication Sig   acetaminophen (TYLENOL) 325 MG tablet Take 650 mg by mouth every 6 (six) hours as needed for moderate pain.   alendronate (FOSAMAX) 70 MG tablet TAKE 1 TABLET BY MOUTH 1 TIME A WEEK WITH FULL GLASS OF WATER AND ON AN EMPTY STOMACH AS DIRECTED   butorphanol (STADOL) 10 MG/ML nasal spray Place 1 spray into the nose every 4 (four) hours as needed for headache (trigeminal neuralgia.).   Calcium Carb-Cholecalciferol (CALCIUM 600+D) 600-800 MG-UNIT TABS Take 1 tablet by mouth 2 (two) times daily.   carvedilol (COREG) 25 MG tablet TAKE 1 TABLET(25 MG) BY MOUTH TWICE DAILY WITH A MEAL   conjugated estrogens (PREMARIN) vaginal cream Place 1 applicator vaginally 2 (two) times a week.   fluticasone (FLONASE) 50 MCG/ACT nasal spray Place 2 sprays into both nostrils as needed for allergies or rhinitis.   furosemide (LASIX) 40 MG tablet Take 1 tablet (40 mg total) by mouth daily.   lamotrigine (LAMICTAL) 25 MG disintegrating tablet Dissolve 2 tablets (50 mg) under tongue twice  daily along with 100 mg for total dose of 150 mg twice daily.   lamoTRIgine 100 MG TBDP Dissolve 1 tablet (100 mg) under tongue twice daily along with 50 mg for total dose of 150 mg twice daily.   Lisinopril 1 MG/ML SOLN Take 20 mg  daily Per g tube   Multiple Vitamin (MULTIVITAMIN ADULT PO) Take 1  tablet by mouth daily.   omeprazole (PRILOSEC) 20 MG capsule Take 1 capsule (20 mg total) by mouth daily. (Patient taking differently: Take 20 mg by mouth daily as needed (acid reflux).)   ondansetron (ZOFRAN) 4 MG tablet Take 1 tablet (4 mg total) by mouth daily as needed for nausea or vomiting.   ondansetron (ZOFRAN) 8 MG tablet Take 1 tablet (8 mg total) by mouth 2 (two) times daily as needed for refractory nausea / vomiting. Start on day 3 after chemo.   ondansetron (ZOFRAN-ODT) 4 MG disintegrating tablet Take 1-2 tablets (4-8 mg total) by mouth every 8 (eight) hours as needed for nausea.   OXcarbazepine (TRILEPTAL) 300 MG/5ML suspension Take 2.5 mLs (150 mg total) by mouth 2 (two) times daily.   Polyethyl Glycol-Propyl Glycol (SYSTANE) 0.4-0.3 % GEL ophthalmic gel Place 1 application into both eyes 2 (two) times daily as needed (dry eyes).   prochlorperazine (COMPAZINE) 10 MG tablet Take 1 tablet (10 mg total) by mouth every 6 (six) hours as needed (Nausea or vomiting).   sucralfate (CARAFATE) 1 g tablet Take 1 tablet (1 g total) by mouth 4 (four) times daily. Dissolve each tablet in 15 cc water before use.   doxycycline (VIBRAMYCIN) 100 MG capsule Take 100 mg by mouth 2 (two) times daily. (Patient not taking: Reported on 04/06/2021)   LORazepam (ATIVAN) 0.5 MG tablet Take 1 tablet by mouth as needed 30-60 minutes before radiation for anxiety (Patient not taking: Reported on 04/06/2021)   magic mouthwash w/lidocaine SOLN Take 5 mLs by mouth every 6 (six) hours as needed for mouth pain. (Patient not taking: Reported on 04/06/2021)   predniSONE (DELTASONE) 20 MG tablet Take 1 tablet (20 mg total) by mouth daily with breakfast for 5 days. (Patient not taking: Reported on 04/06/2021)   No facility-administered encounter medications on file as of 04/06/2021.    Allergies (verified) Hydrocodone and Sulfamethoxazole-trimethoprim   History: Past Medical History:  Diagnosis Date   Abdominal pain  05/29/2013   s/p rx of cephalo resistant e coli   but last rx NG  now residular ?  bladder sx repeat cx sx rx to ty and uro consult    ADJ DISORDER WITH MIXED ANXIETY & DEPRESSED MOOD 03/03/2010   Qualifier: Diagnosis of  By: [name], [name]    Agent resistant to multiple antibiotics 05/29/2013   e coli   bu NG on fu.     Anemia    Anxiety    ARF (acute renal failure) ([place]) 03/12/2015   Cancer ([place])    Closed head injury 02/01/2011   from syncope and had scalp laceration  neg ct .     Closed head injury 5-6 yrs ago   Colitis 93/57/0177   Complication of anesthesia    migraine several hours after general anesthesia   Depression    Fatty liver    Gall stones 2016   see ct scan neg HIDA    GERD (gastroesophageal reflux disease)    Hearing aid worn    HOH (hard of hearing)    both ears   Hyperlipidemia  Hypertension    echo nl lv function  mild dilitation 2009   Kidney infection    few yrs ago in hospital   Medication side effect 09/02/2010   Poss muscle se of 10 crestor    Migraine    hypnic HA eval by Dr. 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    Pyelonephritis 03/12/2015   Sensation of pain in anesthetized distribution of trigeminal nerve    Syncope 02/01/2011   In shower on vacation  sustained head laceration  8 sutures Had ed visit neg head ct labs and x ray    Trigeminal neuralgia pain    Past Surgical History:  Procedure Laterality Date   ABDOMINAL HYSTERECTOMY  2002   tubal   BACK SURGERY     2 times, for sciatic nerve pain   CARDIAC CATHETERIZATION  2000   chest pains neg   CHOLECYSTECTOMY N/A 02/21/2017   Procedure: LAPAROSCOPIC CHOLECYSTECTOMY WITH INTRAOPERATIVE CHOLANGIOGRAM;  Surgeon: Armandina Gemma, MD;  Location: [facility];  Service: General;  Laterality: N/A;   COLONOSCOPY     multiple   CRANIOTOMY  12/09/2011   nerve decompression right trigeminal    DIRECT LARYNGOSCOPY N/A 02/12/2021   Procedure: DIRECT LARYNGOSCOPY AND  BIOPSY POSSIBLE FROZEN;  Surgeon: [name], MD;  Location: [facility];  Service: ENT;  Laterality: N/A;   DOPPLER ECHOCARDIOGRAPHY  2009   nl lv function mild lv dilitation   EYE SURGERY Bilateral    ioc for catatracts   GASTROSTOMY N/A 02/26/2021   Procedure: OPEN GASTROSTOMY TUBE PLACEMENT;  Surgeon: Armandina Gemma, MD;  Location: [facility];  Service: General;  Laterality: N/A;   IR GASTROSTOMY TUBE MOD SED  02/22/2021   laparoscopic gallbladder surgery  02/16/2017   Fax from [facility] Bilateral 2002   rt shoulder surgery     Family History  Problem Relation Age of Onset   Cancer Mother        uterine   Ovarian cancer Mother    Stroke Mother    Alcohol abuse Father    Stroke Father    Diabetes Brother    Cancer Paternal Aunt        leukemia, unknown type   Seizures Daughter    Hypertension Other    Colon cancer Neg Hx    Dementia Neg Hx    Alzheimer's disease Neg Hx    Social History   Socioeconomic History   Marital status: Married    Spouse name: Not on file   Number of children: 2   Years of education: Not on file   Highest education level: Not on file  Occupational History    Comment: retired Forensic psychologist  Tobacco Use   Smoking status: Never   Smokeless tobacco: Never  Vaping Use   Vaping Use: Never used  Substance and Sexual Activity   Alcohol use: Not Currently    Alcohol/week: 2.0 standard drinks    Types: 2 Glasses of wine per week    Comment: occ wine   Drug use: No   Sexual activity: Not on file  Other Topics Concern   Not on file  Social History Narrative   Married   HH of 2-3 (god daughter)   Pets 2 dogs   Non smoker    Child is a physician   G2P2      Caffeine: 2 cups/day   Social Determinants  of Health   Financial Resource Strain: Low Risk    Difficulty of Paying Living Expenses: Not hard at all  Food Insecurity: No Food Insecurity   Worried About [place] in the  Last Year: Never true   [place] in the Last Year: Never true  Transportation Needs: No Transportation Needs   Lack of Transportation (Medical): No   Lack of Transportation (Non-Medical): No  Physical Activity: Sufficiently Active   Days of Exercise per Week: 2 days   Minutes of Exercise per Session: 150+ min  Stress: No Stress Concern Present   Feeling of Stress : Not at all  Social Connections: Not on file    Tobacco Counseling Counseling given: Not Answered   Clinical Intake:  Pre-visit preparation completed: Yes  Pain : No/denies pain     Nutritional Status: BMI of 19-24  Normal Nutritional Risks: None Diabetes: No  How often do you need to have someone help you when you read instructions, pamphlets, or other written materials from your doctor or pharmacy?: 1 - Never  Diabetic? no  Interpreter Needed?: No  Information entered by :: NAllen LPN   Activities of Daily Living In your present state of health, do you have any difficulty performing the following activities: 04/06/2021 02/26/2021  Hearing? Y Y  Comment has hearing aides -  Vision? N N  Difficulty concentrating or making decisions? N N  Walking or climbing stairs? N Y  Dressing or bathing? N N  Doing errands, shopping? N -  Preparing Food and eating ? N -  Using the Toilet? N -  In the past six months, have you accidently leaked urine? N -  Do you have problems with loss of bowel control? N -  Managing your Medications? N -  Managing your Finances? N -  Housekeeping or managing your Housekeeping? N -  Some recent data might be hidden    Patient Care Team: [name], [name], MD as PCP - General [name], MD as PCP - Cardiology (Cardiology) Cindie Crumbly (Neurosurgery) Irine Seal, MD as Attending Physician (Urology) Erline Levine, MD as Consulting Physician (Neurosurgery) [name], MD as Consulting Physician (Neurology) Viona Gilmore, [facility] as Pharmacist  (Pharmacist) [name], MD as Consulting Physician (Gastroenterology) Carol Ada, MD as Consulting Physician (Gastroenterology) [name], MD as Consulting Physician (Otolaryngology) [name], MD as Consulting Physician (Hematology) Kyung Rudd, MD as Consulting Physician (Radiation Oncology)  Indicate any recent Medical Services you may have received from other than [facility] providers in the past year (date may be approximate).     Assessment:   This is a routine wellness examination for [name].  Hearing/Vision screen Vision Screening - Comments:: Regular eye exams, Dr. Luretha Rued  Dietary issues and exercise activities discussed: Current Exercise Habits: Home exercise routine, Type of exercise: stretching;strength training/weights, Time (Minutes): > 60, Frequency (Times/Week): 2, Weekly Exercise (Minutes/Week): 0   Goals Addressed             This Visit's Progress    Patient Stated       04/06/2021, live longer       Depression Screen PHQ 2/9 Scores 04/06/2021 03/25/2020 05/27/2019 05/24/2017 04/26/2016 04/06/2015 11/13/2013  PHQ - 2 Score 0 0 0 0 0 0 0    Fall Risk Fall Risk  04/06/2021 03/25/2020 03/17/2020 05/27/2019 05/25/2018  Falls in the past year? 1 0 1 1 1   Comment tripped over curb - - - -  Number  falls in past yr: 1 0 1 0 1  Injury with Fall? 1 0 1 1 1   Comment stitches in head - - - -  Risk Factor Category  - - - - -  Risk for fall due to : Impaired balance/gait;Impaired mobility;Medication side effect Impaired balance/gait;Impaired mobility - History of fall(s) -  Risk for fall due to: Comment - - - - -  Follow up Falls evaluation completed;Education provided;Falls prevention discussed Falls prevention discussed - Falls evaluation completed -    FALL RISK PREVENTION PERTAINING TO THE HOME:  Any stairs in or around the home? No  If so, are there any without handrails? N/a Home free of loose throw rugs in walkways, pet beds, electrical cords, etc?  Yes  Adequate lighting in your home to reduce risk of falls? Yes   ASSISTIVE DEVICES UTILIZED TO PREVENT FALLS:  Life alert? Yes  Use of a cane, walker or w/c? Yes  Grab bars in the bathroom? Yes  Shower chair or bench in shower? Yes  Elevated toilet seat or a handicapped toilet? Yes   TIMED UP AND GO:  Was the test performed? No .      Cognitive Function: MMSE - Mini Mental State Exam 07/08/2020  Orientation to time 5  Orientation to Place 5  Registration 3  Attention/ Calculation 2  Recall 3  Language- name 2 objects 2  Language- repeat 1  Language- follow 3 step command 3  Language- read & follow direction 1  Write a sentence 1  Copy design 0  Total score 26     6CIT Screen 04/06/2021 03/25/2020  What Year? 0 points 0 points  What month? 0 points 0 points  What time? 0 points -  Count back from 20 0 points 0 points  Months in reverse 0 points 0 points  Repeat phrase 0 points 0 points  Total Score 0 -    Immunizations Immunization History  Administered Date(s) Administered   Fluad Quad(high Dose 65+) 02/18/2019, 03/17/2020   Influenza Split 03/10/2011, 02/02/2012   Influenza Whole 02/18/2008, 03/03/2010   Influenza, High Dose Seasonal PF 03/04/2014, 02/01/2016, 02/22/2017, 03/20/2018   Influenza,inj,Quad PF,6+ Mos 03/05/2013   PFIZER(Purple Top)SARS-COV-2 Vaccination 06/01/2019, 06/22/2019   Pneumococcal Conjugate-13 11/13/2013   Pneumococcal Polysaccharide-23 03/23/2015   Td 05/23/1996   Tdap 09/19/2011   Zoster Recombinat (Shingrix) 11/02/2019, 04/15/2020   Zoster, Live 09/19/2011    TDAP status: Up to date  Flu Vaccine status: Due, Education has been provided regarding the importance of this vaccine. Advised may receive this vaccine at local pharmacy or Health Dept. Aware to provide a copy of the vaccination record if obtained from local pharmacy or Health Dept. Verbalized acceptance and understanding.  Pneumococcal vaccine status: Up to  date  Covid-19 vaccine status: Completed vaccines  Qualifies for Shingles Vaccine? Yes   Zostavax completed Yes   Shingrix Completed?: Yes  Screening Tests Health Maintenance  Topic Date Due   COVID-19 Vaccine (3 - Pfizer risk series) 07/20/2019   INFLUENZA VACCINE  05/01/2021 (Originally 12/21/2020)   TETANUS/TDAP  09/18/2021   Pneumonia Vaccine 52+ Years old  Completed   DEXA SCAN  Completed   Zoster Vaccines- Shingrix  Completed   HPV VACCINES  Aged Out    Health Maintenance  Health Maintenance Due  Topic Date Due   COVID-19 Vaccine (3 - Pfizer risk series) 07/20/2019    Colorectal cancer screening: No longer required.   Mammogram status: Completed 07/22/2020. Repeat every year  Bone  Density status: Completed 05/28/2018. Results reflect: Bone density results: OSTEOPOROSIS. Repeat every 2 years.  Lung Cancer Screening: (Low Dose CT Chest recommended if Age 71-80 years, 30 pack-year currently smoking OR have quit w/in 15years.) does not qualify.   Lung Cancer Screening Referral: no  Additional Screening:  Hepatitis C Screening: does not qualify;   Vision Screening: Recommended annual ophthalmology exams for early detection of glaucoma and other disorders of the eye. Is the patient up to date with their annual eye exam?  Yes  Who is the provider or what is the name of the office in which the patient attends annual eye exams? Dr. Luretha Rued If pt is not established with a provider, would they like to be referred to a provider to establish care? No .   Dental Screening: Recommended annual dental exams for proper oral hygiene  Community Resource Referral / Chronic Care Management: CRR required this visit?  No   CCM required this visit?  No      Plan:     I have personally reviewed and noted the following in the patient's chart:   Medical and social history Use of alcohol, tobacco or illicit drugs  Current medications and supplements including opioid prescriptions.   Functional ability and status Nutritional status Physical activity Advanced directives List of other physicians Hospitalizations, surgeries, and ER visits in previous 12 months Vitals Screenings to include cognitive, depression, and falls Referrals and appointments  In addition, I have reviewed and discussed with patient certain preventive protocols, quality metrics, and best practice recommendations. A written personalized care plan for preventive services as well as general preventive health recommendations were provided to patient.     Kellie Simmering, LPN   [id number]   Nurse Notes: none

## 2021-04-06 NOTE — Patient Instructions (Signed)
[name] , Thank you for taking time to come for your Medicare Wellness Visit. I appreciate your ongoing commitment to your health goals. Please review the following plan we discussed and let me know if I can assist you in the future.   Screening recommendations/referrals: Colonoscopy: not required Mammogram: completed 07/22/2020 Bone Density: completed 05/28/2018 Recommended yearly ophthalmology/optometry visit for glaucoma screening and checkup Recommended yearly dental visit for hygiene and checkup  Vaccinations: Influenza vaccine: due Pneumococcal vaccine: completed 03/23/2015 Tdap vaccine: completed 09/19/2011, due 09/18/2021 Shingles vaccine: completed   Covid-19: 06/22/2019, 06/01/2019  Advanced directives: copy in chart  Conditions/risks identified: none  Next appointment: Follow up in one year for your annual wellness visit    Preventive Care 82 Years and Older, Female Preventive care refers to lifestyle choices and visits with your health care provider that can promote health and wellness. What does preventive care include? A yearly physical exam. This is also called an annual well check. Dental exams once or twice a year. Routine eye exams. Ask your health care provider how often you should have your eyes checked. Personal lifestyle choices, including: Daily care of your teeth and gums. Regular physical activity. Eating a healthy diet. Avoiding tobacco and drug use. Limiting alcohol use. Practicing safe sex. Taking low-dose aspirin every day. Taking vitamin and mineral supplements as recommended by your health care provider. What happens during an annual well check? The services and screenings done by your health care provider during your annual well check will depend on your age, overall health, lifestyle risk factors, and family history of disease. Counseling  Your health care provider may ask you questions about your: Alcohol use. Tobacco use. Drug  use. Emotional well-being. Home and relationship well-being. Sexual activity. Eating habits. History of falls. Memory and ability to understand (cognition). Work and work Statistician. Reproductive health. Screening  You may have the following tests or measurements: Height, weight, and BMI. Blood pressure. Lipid and cholesterol levels. These may be checked every 5 years, or more frequently if you are over 44 years old. Skin check. Lung cancer screening. You may have this screening every year starting at age 52 if you have a 30-pack-year history of smoking and currently smoke or have quit within the past 15 years. Fecal occult blood test (FOBT) of the stool. You may have this test every year starting at age 54. Flexible sigmoidoscopy or colonoscopy. You may have a sigmoidoscopy every 5 years or a colonoscopy every 10 years starting at age 36. Hepatitis C blood test. Hepatitis B blood test. Sexually transmitted disease (STD) testing. Diabetes screening. This is done by checking your blood sugar (glucose) after you have not eaten for a while (fasting). You may have this done every 1-3 years. Bone density scan. This is done to screen for osteoporosis. You may have this done starting at age 76. Mammogram. This may be done every 1-2 years. Talk to your health care provider about how often you should have regular mammograms. Talk with your health care provider about your test results, treatment options, and if necessary, the need for more tests. Vaccines  Your health care provider may recommend certain vaccines, such as: Influenza vaccine. This is recommended every year. Tetanus, diphtheria, and acellular pertussis (Tdap, Td) vaccine. You may need a Td booster every 10 years. Zoster vaccine. You may need this after age 63. Pneumococcal 13-valent conjugate (PCV13) vaccine. One dose is recommended after age 2. Pneumococcal polysaccharide (PPSV23) vaccine. One dose is recommended after age  24.  Talk to your health care provider about which screenings and vaccines you need and how often you need them. This information is not intended to replace advice given to you by your health care provider. Make sure you discuss any questions you have with your health care provider. Document Released: 06/05/2015 Document Revised: 01/27/2016 Document Reviewed: 03/10/2015 Elsevier Interactive Patient Education  2017 [employer] Prevention in the Home Falls can cause injuries. They can happen to people of all ages. There are many things you can do to make your home safe and to help prevent falls. What can I do on the outside of my home? Regularly fix the edges of walkways and driveways and fix any cracks. Remove anything that might make you trip as you walk through a door, such as a raised step or threshold. Trim any bushes or trees on the path to your home. Use bright outdoor lighting. Clear any walking paths of anything that might make someone trip, such as rocks or tools. Regularly check to see if handrails are loose or broken. Make sure that both sides of any steps have handrails. Any raised decks and porches should have guardrails on the edges. Have any leaves, snow, or ice cleared regularly. Use sand or salt on walking paths during winter. Clean up any spills in your garage right away. This includes oil or grease spills. What can I do in the bathroom? Use night lights. Install grab bars by the toilet and in the tub and shower. Do not use towel bars as grab bars. Use non-skid mats or decals in the tub or shower. If you need to sit down in the shower, use a plastic, non-slip stool. Keep the floor dry. Clean up any water that spills on the floor as soon as it happens. Remove soap buildup in the tub or shower regularly. Attach bath mats securely with double-sided non-slip rug tape. Do not have throw rugs and other things on the floor that can make you trip. What can I do in the  bedroom? Use night lights. Make sure that you have a light by your bed that is easy to reach. Do not use any sheets or blankets that are too big for your bed. They should not hang down onto the floor. Have a firm chair that has side arms. You can use this for support while you get dressed. Do not have throw rugs and other things on the floor that can make you trip. What can I do in the kitchen? Clean up any spills right away. Avoid walking on wet floors. Keep items that you use a lot in easy-to-reach places. If you need to reach something above you, use a strong step stool that has a grab bar. Keep electrical cords out of the way. Do not use floor polish or wax that makes floors slippery. If you must use wax, use non-skid floor wax. Do not have throw rugs and other things on the floor that can make you trip. What can I do with my stairs? Do not leave any items on the stairs. Make sure that there are handrails on both sides of the stairs and use them. Fix handrails that are broken or loose. Make sure that handrails are as long as the stairways. Check any carpeting to make sure that it is firmly attached to the stairs. Fix any carpet that is loose or worn. Avoid having throw rugs at the top or bottom of the stairs. If you do have throw rugs,  attach them to the floor with carpet tape. Make sure that you have a light switch at the top of the stairs and the bottom of the stairs. If you do not have them, ask someone to add them for you. What else can I do to help prevent falls? Wear shoes that: Do not have high heels. Have rubber bottoms. Are comfortable and fit you well. Are closed at the toe. Do not wear sandals. If you use a stepladder: Make sure that it is fully opened. Do not climb a closed stepladder. Make sure that both sides of the stepladder are locked into place. Ask someone to hold it for you, if possible. Clearly mark and make sure that you can see: Any grab bars or  handrails. First and last steps. Where the edge of each step is. Use tools that help you move around (mobility aids) if they are needed. These include: Canes. Walkers. Scooters. Crutches. Turn on the lights when you go into a dark area. Replace any light bulbs as soon as they burn out. Set up your furniture so you have a clear path. Avoid moving your furniture around. If any of your floors are uneven, fix them. If there are any pets around you, be aware of where they are. Review your medicines with your doctor. Some medicines can make you feel dizzy. This can increase your chance of falling. Ask your doctor what other things that you can do to help prevent falls. This information is not intended to replace advice given to you by your health care provider. Make sure you discuss any questions you have with your health care provider. Document Released: 03/05/2009 Document Revised: 10/15/2015 Document Reviewed: 06/13/2014 Elsevier Interactive Patient Education  2017 [employer].

## 2021-04-07 ENCOUNTER — Telehealth: Payer: Self-pay | Admitting: Hematology

## 2021-04-07 ENCOUNTER — Ambulatory Visit
Admission: RE | Admit: 2021-04-07 | Discharge: 2021-04-07 | Disposition: A | Discharge status: Home / Self Care | Payer: Medicare Other | Source: Ambulatory Visit | Attending: Radiation Oncology | Admitting: Radiation Oncology

## 2021-04-07 ENCOUNTER — Other Ambulatory Visit: Payer: Self-pay

## 2021-04-07 ENCOUNTER — Ambulatory Visit: Payer: Medicare Other

## 2021-04-07 DIAGNOSIS — Z510 Encounter for antineoplastic radiation therapy: Secondary | ICD-10-CM | POA: Diagnosis not present

## 2021-04-07 DIAGNOSIS — G500 Trigeminal neuralgia: Secondary | ICD-10-CM | POA: Diagnosis not present

## 2021-04-07 DIAGNOSIS — C153 Malignant neoplasm of upper third of esophagus: Secondary | ICD-10-CM | POA: Diagnosis not present

## 2021-04-07 DIAGNOSIS — R030 Elevated blood-pressure reading, without diagnosis of hypertension: Secondary | ICD-10-CM | POA: Diagnosis not present

## 2021-04-07 NOTE — Telephone Encounter (Signed)
Left message with follow-up appointment per 11/14 los.

## 2021-04-08 ENCOUNTER — Ambulatory Visit
Admission: RE | Admit: 2021-04-08 | Discharge: 2021-04-08 | Disposition: A | Discharge status: Home / Self Care | Payer: Medicare Other | Source: Ambulatory Visit | Attending: Radiation Oncology | Admitting: Radiation Oncology

## 2021-04-08 ENCOUNTER — Other Ambulatory Visit: Payer: Self-pay

## 2021-04-08 DIAGNOSIS — C153 Malignant neoplasm of upper third of esophagus: Secondary | ICD-10-CM | POA: Diagnosis not present

## 2021-04-08 DIAGNOSIS — Z510 Encounter for antineoplastic radiation therapy: Secondary | ICD-10-CM | POA: Diagnosis not present

## 2021-04-08 MED ORDER — SONAFINE EX EMUL
1.0000 "application " | Freq: Two times a day (BID) | CUTANEOUS | Status: DC
  Administered 2021-04-08: 1 via TOPICAL

## 2021-04-09 ENCOUNTER — Other Ambulatory Visit: Payer: Self-pay | Admitting: Radiation Oncology

## 2021-04-09 ENCOUNTER — Telehealth: Payer: Self-pay

## 2021-04-09 ENCOUNTER — Ambulatory Visit
Admission: RE | Admit: 2021-04-09 | Discharge: 2021-04-09 | Disposition: A | Discharge status: Home / Self Care | Payer: Medicare Other | Source: Ambulatory Visit | Attending: Radiation Oncology | Admitting: Radiation Oncology

## 2021-04-09 DIAGNOSIS — C153 Malignant neoplasm of upper third of esophagus: Secondary | ICD-10-CM | POA: Diagnosis not present

## 2021-04-09 DIAGNOSIS — Z510 Encounter for antineoplastic radiation therapy: Secondary | ICD-10-CM | POA: Diagnosis not present

## 2021-04-09 MED ORDER — HYDROCODONE-ACETAMINOPHEN 7.5-325 MG/15ML PO SOLN: 5.0000 mL | Freq: Four times a day (QID) | ORAL | 0 refills | Status: DC | PRN

## 2021-04-09 NOTE — Telephone Encounter (Signed)
Left a message for the pt to return my call.  

## 2021-04-12 ENCOUNTER — Ambulatory Visit: Payer: Medicare Other | Admitting: Radiation Oncology

## 2021-04-12 ENCOUNTER — Other Ambulatory Visit: Payer: Self-pay

## 2021-04-12 ENCOUNTER — Ambulatory Visit
Admission: RE | Admit: 2021-04-12 | Discharge: 2021-04-12 | Disposition: A | Discharge status: Home / Self Care | Payer: Medicare Other | Source: Ambulatory Visit | Attending: Radiation Oncology | Admitting: Radiation Oncology

## 2021-04-12 ENCOUNTER — Telehealth (INDEPENDENT_AMBULATORY_CARE_PROVIDER_SITE_OTHER): Payer: Medicare Other | Admitting: Internal Medicine

## 2021-04-12 ENCOUNTER — Encounter: Payer: Self-pay | Admitting: Internal Medicine

## 2021-04-12 VITALS — Ht 66.0 in | Wt 125.0 lb

## 2021-04-12 DIAGNOSIS — I10 Essential (primary) hypertension: Secondary | ICD-10-CM | POA: Diagnosis not present

## 2021-04-12 DIAGNOSIS — Z510 Encounter for antineoplastic radiation therapy: Secondary | ICD-10-CM | POA: Diagnosis not present

## 2021-04-12 DIAGNOSIS — R52 Pain, unspecified: Secondary | ICD-10-CM | POA: Diagnosis not present

## 2021-04-12 DIAGNOSIS — C153 Malignant neoplasm of upper third of esophagus: Secondary | ICD-10-CM

## 2021-04-12 DIAGNOSIS — Z79899 Other long term (current) drug therapy: Secondary | ICD-10-CM

## 2021-04-12 NOTE — Progress Notes (Signed)
                                                                                                                                                               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 She developed fatigue and anticipated skin changes in the treatment field. She did develop some hoarseness and episodes of gagging continued during therapy. She used her PEG tube for enteral nutritional support.  Plan: The patient will receive a call in about one month from the radiation oncology department. She will continue follow up with Dr. [name] as well.   ________________________________________________    Carola Rhine, [facility]

## 2021-04-12 NOTE — Telephone Encounter (Addendum)
I spoke with the pt and a virtual visit has been scheduled for today with PCP

## 2021-04-12 NOTE — Progress Notes (Signed)
   Virtual Visit via Telephone Note  I connected with@ on 04/12/21 at  3:30 PM EST by telephone and verified that I am speaking with the correct person using two identifiers.   I discussed the limitations, risks, security and privacy concerns of performing an evaluation and management service by telephone and the limited availability of in person appointments. tThere may be a patient responsible charge related to this service. The patient expressed understanding and agreed to proceed.  Location patient: home Location provider: work or home office Participants present for the call: patient, provider Patient did not have a visit in the prior 7 days to address this/these issue(s).   History of Present Illness: [name] presents with a telephone visit in lieu of an in person routine follow-up visit where she is undergoing active chemotherapy and radiation treatment for diagnosed esophageal cancer.  She is losing her voice has lost her hair has a G-tube for nutrition that is being watched and has had some increase in weight recently.  Her husband is driving her to these treatments.  She is having pain related to the treatments but cannot take oxycodone or hydrocodone because it triggers her migraine headaches.  But this was what was prescribed for pain and wonders if something else can be prescribed.  Blood pressure seems to be okay medicine was changed to liquid any medicines need to be able to be given through the G-tube. Denies any breathing problems at this time.   Observations/Objective: Patient sounds personable and very hoarse but no respiratory distress I do not appreciate any SOB.  thought processing are grossly intact.  Lab Results  Component Value Date   WBC 2.4 (L) 04/05/2021   HGB 12.1 04/05/2021   HCT 37.3 04/05/2021   PLT 145 (L) 04/05/2021   GLUCOSE 191 (H) 04/05/2021   CHOL 279 (H) 05/27/2019   TRIG 78.0 05/27/2019   HDL 118.80 05/27/2019   LDLDIRECT  135.9 10/16/2012   LDLCALC 145 (H) 05/27/2019   ALT 15 04/05/2021   AST 14 (L) 04/05/2021   NA 138 04/05/2021   K 4.2 04/05/2021   CL 98 04/05/2021   CREATININE 0.80 04/05/2021   BUN 27 (H) 04/05/2021   CO2 29 04/05/2021   TSH 0.88 07/01/2020   INR 1.1 02/22/2021   HGBA1C 5.5 05/25/2018    Assessment and Plan: Pain - related to radiation and underlying esoph cancer rx "cannot tkae coys or hydrocodone as it triggers her migraines needs help with this willsend info to  team.  Malignant neoplasm of upper third esophagus ([place])  Essential hypertension - reported  OK   Medication management   Follow Up Instructions:  Depnding when needed    [id number] 5-10 [id number] 11-20 [id number] 21-30 I did not refer this patient for an OV in the next 24 hours for this/these issue(s).  I discussed the assessment and treatment plan with the patient. The patient was provided an opportunity to ask questions and answered. The patient agreed with the plan and demonstrated an understanding of the instructions.   The patient was advised to call back or seek an in-person evaluation if the symptoms worsen or if the condition fails to improve as anticipated.  I provided 15 minutes of non-face-to-face time during this encounter. Return for depending .  [name], MD

## 2021-04-13 ENCOUNTER — Inpatient Hospital Stay: Payer: Medicare Other | Admitting: Nutrition

## 2021-04-13 ENCOUNTER — Encounter: Payer: Self-pay | Admitting: Dietician

## 2021-04-13 ENCOUNTER — Inpatient Hospital Stay: Payer: Medicare Other

## 2021-04-13 ENCOUNTER — Ambulatory Visit
Admission: RE | Admit: 2021-04-13 | Discharge: 2021-04-13 | Disposition: A | Discharge status: Home / Self Care | Payer: Medicare Other | Source: Ambulatory Visit | Attending: Radiation Oncology | Admitting: Radiation Oncology

## 2021-04-13 ENCOUNTER — Inpatient Hospital Stay: Payer: Medicare Other | Admitting: Hematology

## 2021-04-13 VITALS — BP 172/84 | HR 77 | Temp 97.8°F | Resp 18 | Ht 66.0 in | Wt 128.0 lb

## 2021-04-13 DIAGNOSIS — L598 Other specified disorders of the skin and subcutaneous tissue related to radiation: Secondary | ICD-10-CM | POA: Diagnosis not present

## 2021-04-13 DIAGNOSIS — Z510 Encounter for antineoplastic radiation therapy: Secondary | ICD-10-CM | POA: Diagnosis not present

## 2021-04-13 DIAGNOSIS — I10 Essential (primary) hypertension: Secondary | ICD-10-CM | POA: Diagnosis not present

## 2021-04-13 DIAGNOSIS — C153 Malignant neoplasm of upper third of esophagus: Secondary | ICD-10-CM

## 2021-04-13 DIAGNOSIS — G500 Trigeminal neuralgia: Secondary | ICD-10-CM | POA: Diagnosis not present

## 2021-04-13 DIAGNOSIS — R1310 Dysphagia, unspecified: Secondary | ICD-10-CM | POA: Diagnosis not present

## 2021-04-13 DIAGNOSIS — R042 Hemoptysis: Secondary | ICD-10-CM | POA: Diagnosis not present

## 2021-04-13 DIAGNOSIS — Z806 Family history of leukemia: Secondary | ICD-10-CM | POA: Diagnosis not present

## 2021-04-13 DIAGNOSIS — Z5111 Encounter for antineoplastic chemotherapy: Secondary | ICD-10-CM | POA: Diagnosis not present

## 2021-04-13 DIAGNOSIS — Z808 Family history of malignant neoplasm of other organs or systems: Secondary | ICD-10-CM | POA: Diagnosis not present

## 2021-04-13 DIAGNOSIS — Z8041 Family history of malignant neoplasm of ovary: Secondary | ICD-10-CM | POA: Diagnosis not present

## 2021-04-13 DIAGNOSIS — R634 Abnormal weight loss: Secondary | ICD-10-CM | POA: Diagnosis not present

## 2021-04-13 DIAGNOSIS — R490 Dysphonia: Secondary | ICD-10-CM | POA: Diagnosis not present

## 2021-04-13 LAB — CBC WITH DIFFERENTIAL (CANCER CENTER ONLY)
Abs Immature Granulocytes: 0.01 10*3/uL (ref 0.00–0.07)
Basophils Absolute: 0 10*3/uL (ref 0.0–0.1)
Basophils Relative: 0 %
Eosinophils Absolute: 0 10*3/uL (ref 0.0–0.5)
Eosinophils Relative: 1 %
HCT: 33.2 % — ABNORMAL LOW (ref 36.0–46.0)
Hemoglobin: 10.9 g/dL — ABNORMAL LOW (ref 12.0–15.0)
Immature Granulocytes: 0 %
Lymphocytes Relative: 9 %
Lymphs Abs: 0.2 10*3/uL — ABNORMAL LOW (ref 0.7–4.0)
MCH: 30.2 pg (ref 26.0–34.0)
MCHC: 32.8 g/dL (ref 30.0–36.0)
MCV: 92 fL (ref 80.0–100.0)
Monocytes Absolute: 0.4 10*3/uL (ref 0.1–1.0)
Monocytes Relative: 15 %
Neutro Abs: 1.8 10*3/uL (ref 1.7–7.7)
Neutrophils Relative %: 75 %
Platelet Count: 96 10*3/uL — ABNORMAL LOW (ref 150–400)
RBC: 3.61 MIL/uL — ABNORMAL LOW (ref 3.87–5.11)
RDW: 16.3 % — ABNORMAL HIGH (ref 11.5–15.5)
WBC Count: 2.4 10*3/uL — ABNORMAL LOW (ref 4.0–10.5)
nRBC: 0 % (ref 0.0–0.2)

## 2021-04-13 LAB — CMP (CANCER CENTER ONLY)
ALT: 14 U/L (ref 0–44)
AST: 15 U/L (ref 15–41)
Albumin: 3.1 g/dL — ABNORMAL LOW (ref 3.5–5.0)
Alkaline Phosphatase: 72 U/L (ref 38–126)
Anion gap: 8 (ref 5–15)
BUN: 23 mg/dL (ref 8–23)
CO2: 30 mmol/L (ref 22–32)
Calcium: 9.3 mg/dL (ref 8.9–10.3)
Chloride: 99 mmol/L (ref 98–111)
Creatinine: 0.67 mg/dL (ref 0.44–1.00)
GFR, Estimated: >60 mL/min (ref >60)
Glucose, Bld: 144 mg/dL — ABNORMAL HIGH (ref 70–99)
Potassium: 4.4 mmol/L (ref 3.5–5.1)
Sodium: 137 mmol/L (ref 135–145)
Total Bilirubin: 0.5 mg/dL (ref 0.3–1.2)
Total Protein: 5.9 g/dL — ABNORMAL LOW (ref 6.5–8.1)

## 2021-04-13 MED ORDER — SODIUM CHLORIDE 0.9% FLUSH: 10.0000 mL | INTRAVENOUS | Status: DC | PRN

## 2021-04-13 MED ORDER — SODIUM CHLORIDE 0.9 % IV SOLN
Freq: Once | INTRAVENOUS | Status: AC
Start: 2021-04-13 — End: 2021-04-13

## 2021-04-13 MED ORDER — SODIUM CHLORIDE 0.9 % IV SOLN
50.0000 mg/m2 | Freq: Once | INTRAVENOUS | Status: AC
  Administered 2021-04-13: 78 mg via INTRAVENOUS
  Filled 2021-04-13: qty 13

## 2021-04-13 MED ORDER — FAMOTIDINE 20 MG IN NS 100 ML IVPB
20.0000 mg | Freq: Once | INTRAVENOUS | Status: AC
  Administered 2021-04-13: 20 mg via INTRAVENOUS
  Filled 2021-04-13: qty 100

## 2021-04-13 MED ORDER — DIPHENHYDRAMINE HCL 50 MG/ML IJ SOLN
25.0000 mg | Freq: Once | INTRAMUSCULAR | Status: AC
  Administered 2021-04-13: 25 mg via INTRAVENOUS
  Filled 2021-04-13: qty 1

## 2021-04-13 MED ORDER — SODIUM CHLORIDE 0.9 % IV SOLN
10.0000 mg | Freq: Once | INTRAVENOUS | Status: AC
  Administered 2021-04-13: 10 mg via INTRAVENOUS
  Filled 2021-04-13: qty 10

## 2021-04-13 MED ORDER — GUAIFENESIN-CODEINE 100-10 MG/5ML PO SOLN: 5.0000 mL | Freq: Four times a day (QID) | ORAL | 0 refills | Status: DC | PRN

## 2021-04-13 MED ORDER — PALONOSETRON HCL INJECTION 0.25 MG/5ML
0.2500 mg | Freq: Once | INTRAVENOUS | Status: AC
  Administered 2021-04-13: 0.25 mg via INTRAVENOUS
  Filled 2021-04-13: qty 5

## 2021-04-13 MED ORDER — SODIUM CHLORIDE 0.9 % IV SOLN
95.5500 mg | Freq: Once | INTRAVENOUS | Status: AC
  Administered 2021-04-13: 100 mg via INTRAVENOUS
  Filled 2021-04-13: qty 10

## 2021-04-13 MED ORDER — HEPARIN SOD (PORK) LOCK FLUSH 100 UNIT/ML IV SOLN: 500.0000 [IU] | Freq: Once | INTRAVENOUS | Status: DC | PRN

## 2021-04-13 NOTE — Progress Notes (Signed)
Ok to treat with CBC per MD [name]

## 2021-04-13 NOTE — Progress Notes (Signed)
Nutrition  Patient requested assistance with tube feeding during infusion. Husband gives bolus feedings when at home . She is tolerating tube feedings at goal of 5 cartons Osmolite 1.5 day. Patient brought formula and syringe from home today. RD flushed tube with 60 ml water before bolus feeding and 90 ml water after. Patient tolerated well and appreciative of assistance.

## 2021-04-13 NOTE — Patient Instructions (Signed)
[facility]   Discharge Instructions: Thank you for choosing [facility] to provide your oncology and hematology care.   If you have a lab appointment with the [facility], please go directly to the [facility] and check in at the registration area.   Wear comfortable clothing and clothing appropriate for easy access to any Portacath or PICC line.   We strive to give you quality time with your provider. You may need to reschedule your appointment if you arrive late (15 or more minutes).  Arriving late affects you and other patients whose appointments are after yours.  Also, if you miss three or more appointments without notifying the office, you may be dismissed from the clinic at the provider's discretion.      For prescription refill requests, have your pharmacy contact our office and allow 72 hours for refills to be completed.    Today you received the following chemotherapy and/or immunotherapy agents: paclitaxel and carboplatin.      To help prevent nausea and vomiting after your treatment, we encourage you to take your nausea medication as directed.  BELOW ARE SYMPTOMS THAT SHOULD BE REPORTED IMMEDIATELY: *FEVER GREATER THAN 100.4 F (38 C) OR HIGHER *CHILLS OR SWEATING *NAUSEA AND VOMITING THAT IS NOT CONTROLLED WITH YOUR NAUSEA MEDICATION *UNUSUAL SHORTNESS OF BREATH *UNUSUAL BRUISING OR BLEEDING *URINARY PROBLEMS (pain or burning when urinating, or frequent urination) *BOWEL PROBLEMS (unusual diarrhea, constipation, pain near the anus) TENDERNESS IN MOUTH AND THROAT WITH OR WITHOUT PRESENCE OF ULCERS (sore throat, sores in mouth, or a toothache) UNUSUAL RASH, SWELLING OR PAIN  UNUSUAL VAGINAL DISCHARGE OR ITCHING   Items with * indicate a potential emergency and should be followed up as soon as possible or go to the Emergency Department if any problems should occur.  Please show the CHEMOTHERAPY ALERT CARD or IMMUNOTHERAPY ALERT  CARD at check-in to the Emergency Department and triage nurse.  Should you have questions after your visit or need to cancel or reschedule your appointment, please contact [facility]  Dept: [phone]  and follow the prompts.  Office hours are 8:00 a.m. to 4:30 p.m. Monday - Friday. Please note that voicemails left after 4:00 p.m. may not be returned until the following business day.  We are closed weekends and major holidays. You have access to a nurse at all times for urgent questions. Please call the main number to the clinic Dept: [phone] and follow the prompts.   For any non-urgent questions, you may also contact your provider using MyChart. We now offer e-Visits for anyone 6 and older to request care online for non-urgent symptoms. For details visit [email or web address].   Also download the MyChart app! Go to the app store, search "MyChart", open the app, select [facility], and log in with your MyChart username and password.  Due to Covid, a mask is required upon entering the hospital/clinic. If you do not have a mask, one will be given to you upon arrival. For doctor visits, patients may have 1 support person aged 18 or older with them. For treatment visits, patients cannot have anyone with them due to current Covid guidelines and our immunocompromised population.

## 2021-04-13 NOTE — Progress Notes (Signed)
Nutrition follow-up completed with patient during infusion for esophageal cancer.  Final radiation therapy is November 23.  Weight improved and documented as 128 pounds November 22.  Labs include glucose 144.  Estimated nutrition needs: 1660-1825 cal, 72-88 g protein, 1.7 L fluid.  5 cartons Osmolite 1.5 provides 1775 cal, 74.5 g protein, 1805 mL free water.  This meets 100% estimated nutrition needs.  Patient reports she tolerates tube feedings well.  Her husband helps her with these as needed.  She denies nausea, vomiting, constipation, diarrhea.  States she does not want to drink too much water while infusion because she does not want to have to get out of the chair to go to the bathroom.  She is pleased today is her last treatment.  Reports she has plenty of formula and supplies.  Nutrition diagnosis: Inadequate oral intake continues.  Intervention: Continue Osmolite 1.5, 1 carton 5 times daily with free water flushes of 60 mL before and 120 mL after each feeding. Encouraged patient to take fluids as tolerated by mouth.  Increase diet as tolerated.  Monitoring, evaluation, goals: Patient will tolerate tube feeding at goal rate for weight maintenance/weight gain.  Resume oral intake as tolerated and wean tube feeding.  Next visit: Telephone call in approximately 1 month.  **Disclaimer: This note was dictated with voice recognition software. Similar sounding words can inadvertently be transcribed and this note may contain transcription errors which may not have been corrected upon publication of note.**

## 2021-04-14 ENCOUNTER — Other Ambulatory Visit: Payer: Self-pay | Admitting: Radiation Oncology

## 2021-04-14 ENCOUNTER — Ambulatory Visit
Admission: RE | Admit: 2021-04-14 | Discharge: 2021-04-14 | Disposition: A | Discharge status: Home / Self Care | Payer: Medicare Other | Source: Ambulatory Visit | Attending: Radiation Oncology | Admitting: Radiation Oncology

## 2021-04-14 ENCOUNTER — Encounter: Payer: Self-pay | Admitting: Hematology

## 2021-04-14 ENCOUNTER — Encounter: Payer: Self-pay | Admitting: Radiation Oncology

## 2021-04-14 ENCOUNTER — Encounter: Payer: Self-pay | Admitting: Nurse Practitioner

## 2021-04-14 ENCOUNTER — Other Ambulatory Visit: Payer: Self-pay

## 2021-04-14 DIAGNOSIS — C153 Malignant neoplasm of upper third of esophagus: Secondary | ICD-10-CM | POA: Diagnosis not present

## 2021-04-14 DIAGNOSIS — Z510 Encounter for antineoplastic radiation therapy: Secondary | ICD-10-CM | POA: Diagnosis not present

## 2021-04-14 MED ORDER — ACETAMINOPHEN-CODEINE 120-12 MG/5ML PO SOLN: 5.0000 mL | ORAL | 0 refills | Status: AC | PRN

## 2021-04-14 NOTE — Progress Notes (Signed)
[place]   [phone] Fax:([phone]) [phone]   Clinic Follow up Note   Patient Care Team: [name], [name], MD as PCP - General [name], MD as PCP - Cardiology (Cardiology) Cindie Crumbly (Neurosurgery) Irine Seal, MD as Attending Physician (Urology) Erline Levine, MD as Consulting Physician (Neurosurgery) [name], MD as Consulting Physician (Neurology) Viona Gilmore, [facility] as Pharmacist (Pharmacist) [name], MD as Consulting Physician (Gastroenterology) Carol Ada, MD as Consulting Physician (Gastroenterology) [name], MD as Consulting Physician (Otolaryngology) [name], MD as Consulting Physician (Hematology) Kyung Rudd, MD as Consulting Physician (Radiation Oncology) Kyung Rudd, MD as Consulting Physician (Radiation Oncology)  Date of Service:  04/13/2021  CHIEF COMPLAINT: f/u of esophageal cancer  CURRENT THERAPY:  Concurrent chemo/RT with carbo/taxol, radiation starting 03/08/21, chemo started 03/09/21   ASSESSMENT & PLAN:  [name] is a 80 y.o. female with   1. 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 Path confirmed squamous cell carcinoma of the esophagus  -She had feeding tube placed by Dr. Harlow Asa 02/26/21  -PET scan shows hypermetabolic esophageal mass and hypermetabolic paratracheal nodes, negative to distant metastasis. This is local disease  -due to the location in the cervical esophagus, Dr. Kipp Brood feels she is not a surgical candidate.  -she has mild abnormal nerve sensation in the feet, no previous diagnosis of neuropathy. Will monitor closely on taxol.  -PDL1 testing is 95% positive, predicting a good response to immunotherapy which we plan to give after chemoRT for up to 2 years. Patient is interested. She  may want a port a cath at some point, will discuss another day. -she began RT 03/08/21 and carbo/taxol 03/09/21, tolerating mostly well.  -she is on week 6 chemoRT, she has developed a significant radiation dermatitis at the neck, with hoarseness, dysphagia and odynophagia. -Lab reviewed, adequate for treatment, will proceed to last cycle carbo and Taxol with slight dose reduction. -Up in 2 weeks -Plan to start nivolumab in about 4 weeks when she recovers well   2. Dysphagia, weight loss  -Secondary to #1 -She has not tolerated solids lately, only pureed or liquid diet with difficulty. Can change meds to liquid as applicable  -She underwent surgical placement of feeding tube by Dr. Harlow Asa on 02/26/21 -continue tube feeds    3. Trigeminal neuralgia, sciatica, headaches -On lamotrigene and oxcarbazepine  -pain has worsened lately, I reached out to Dr. Jaynee Eagles who had recently adjusted meds -she has also been contacted by neurology, who prescribed Stadol nasal spray.   4. HTN -continue benazepril, furosemide -benazepril reduced to once daily from 03/09/21, and let her know to hold lasix if she becomes dizzy at home   -monitoring closely    5. Goals of Care -Code status was DNR from hospitalization 05/16/20, changed to full code during 02/26/2021 hospitalization for feeding tube placement   -goal of chemoRT is curative    PLAN: -proceed to radiation and continue daily -will proceed last dose carbo/taxol today with slightly carboplatin dose reduction -Lab and follow-up in 2 weeks -We will check with our pharmacy to see if her oral medications can be crushed   No problem-specific Assessment & Plan notes found for this encounter.   SUMMARY OF ONCOLOGIC HISTORY: Oncology History  Malignant neoplasm of upper third esophagus ([place])  12/21/2020 Imaging   Laryngoscopy Comments:  On fiberoptic laryngoscopy through the right nostril the nasopharynx was clear.  The base of tongue vallecula and  epiglottis were normal.    Piriform sinuses were clear bilaterally and vocal cords were clear with  normal vocal mobility.  No structural abnormalities noted.   01/22/2021 Imaging   DG esophagus IMPRESSION: 1. Luminal narrowing in the high cervical esophagus over a 3 cm segment is highly concerning for esophageal neoplasm. Inflammatory process would be a secondary consideration. Luminal narrowing occurs approximately at the C5-C7 vertebral body level just below the glottis. Patient experienced several episodes of choking related to this luminal narrowing. Recommend expedient upper GI endoscopy for evaluation. 2. Distal thoracic esophagus and GE junction appear normal.   02/04/2021 Procedure   EGD by Dr. Carlean Purl impression- One mass-like severe stenosis was found in the upper third of the esophagus. The stenosis was not traversed.   02/05/2021 Imaging   CT soft tissue neck w contrast IMPRESSION: Mass-like soft tissue thickening and mucosal hyperenhancement of the cervical esophagus and hypopharynx, spanning the C4-T1 levels, likely reflecting an esophageal malignancy. This measures up to 2.4 x 3.4 cm in transaxial dimensions, and 6.3 cm in craniocaudal dimension. Associated severe effacement of the esophageal lumen.   Centrally necrotic right paratracheal lymph node beneath the level of the mass, measuring 1.3 x 0.9 cm, and likely reflecting a site of nodal metastatic disease.   Additional lymph nodes along the posterior and inferior aspect of the right thyroid lobe (along the right aspect of the esophageal mass), which measure subcentimeter but are asymmetrically prominent and highly suspicious for additional sites of nodal metastatic disease.   02/12/2021 Pathology Results   FINAL MICROSCOPIC DIAGNOSIS:  A. ESOPHAGUS, UPPER CERVICAL #1, BIOPSY:  - Squamous cell carcinoma.  B. ESOPHAGUS, UPPER CERVICAL #2, BIOPSY:  - Squamous cell carcinoma.    02/12/2021 Cancer Staging   Staging form:  Esophagus - Squamous Cell Carcinoma, AJCC 8th Edition - Clinical stage from 02/12/2021: Stage Unknown (cTX, cN2, cM0) - Signed by [name], MD on 03/08/2021 Stage prefix: Initial diagnosis    02/18/2021 Initial Diagnosis   Malignant neoplasm of upper third esophagus ([place])   02/23/2021 PET scan   IMPRESSION: Markedly hypermetabolic focal masslike thickening of the cervical esophagus, compatible with primary esophageal malignancy.   Markedly hypermetabolic right upper paratracheal lymph node, compatible with metastatic disease.   Small right upper paratracheal and right level IIA lymph nodes with mild hypermetabolic activity, concerning for additional sites of nodal metastatic disease.   Aortic Atherosclerosis (ICD10-I70.0).   03/09/2021 -  Chemotherapy   Patient is on Treatment Plan : ESOPHAGUS Carboplatin/PACLitaxel weekly x 6 weeks with XRT          INTERVAL HISTORY:  [name] is here for a follow up of esophageal cancer. I sw her in infusion room today.  Her skin redness and peeling in the neck and upper chest are much worse than two weeks ago, she remains to be hoarse, has worsening odynophagia, difficulty to swallow pills (she crush and mixed with yogurt or applesauce). No fever or other new complains.   All other systems were reviewed with the patient and are negative.  MEDICAL HISTORY:  Past Medical History:  Diagnosis Date   Abdominal pain 05/29/2013   s/p rx of cephalo resistant e coli   but last rx NG  now residular ?  bladder sx repeat cx sx rx to ty and uro consult    [place]  03/03/2010   Qualifier: Diagnosis of  By: [name], [name]    Agent resistant to multiple antibiotics 05/29/2013   e coli   bu NG on fu.     Anemia    Anxiety    ARF (acute renal failure) ([place]) 03/12/2015   Cancer ([place])    Closed head injury 02/01/2011   from syncope and had scalp laceration  neg ct .     Closed head injury 5-6 yrs ago    Colitis 85/46/2703   Complication of anesthesia    migraine several hours after general anesthesia   Depression    Fatty liver    Gall stones 2016   see ct scan neg HIDA    GERD (gastroesophageal reflux disease)    Hearing aid worn    HOH (hard of hearing)    both ears   Hyperlipidemia    Hypertension    echo nl lv function  mild dilitation 2009   Kidney infection    few yrs ago in hospital   Medication side effect 09/02/2010   Poss muscle se of 10 crestor    Migraine    hypnic HA eval by Dr. 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    Pyelonephritis 03/12/2015   Sensation of pain in anesthetized distribution of trigeminal nerve    Syncope 02/01/2011   In shower on vacation  sustained head laceration  8 sutures Had ed visit neg head ct labs and x ray    Trigeminal neuralgia pain     SURGICAL HISTORY: Past Surgical History:  Procedure Laterality Date   ABDOMINAL HYSTERECTOMY  2002   tubal   BACK SURGERY     2 times, for sciatic nerve pain   CARDIAC CATHETERIZATION  2000   chest pains neg   CHOLECYSTECTOMY N/A 02/21/2017   Procedure: LAPAROSCOPIC CHOLECYSTECTOMY WITH INTRAOPERATIVE CHOLANGIOGRAM;  Surgeon: Armandina Gemma, MD;  Location: [facility];  Service: General;  Laterality: N/A;   COLONOSCOPY     multiple   CRANIOTOMY  12/09/2011   nerve decompression right trigeminal    DIRECT LARYNGOSCOPY N/A 02/12/2021   Procedure: DIRECT LARYNGOSCOPY AND BIOPSY POSSIBLE FROZEN;  Surgeon: [name], MD;  Location: [facility];  Service: ENT;  Laterality: N/A;   DOPPLER ECHOCARDIOGRAPHY  2009   nl lv function mild lv dilitation   EYE SURGERY Bilateral    ioc for catatracts   GASTROSTOMY N/A 02/26/2021   Procedure: OPEN GASTROSTOMY TUBE PLACEMENT;  Surgeon: Armandina Gemma, MD;  Location: [facility];  Service: General;  Laterality: N/A;   IR GASTROSTOMY TUBE MOD SED  02/22/2021   laparoscopic gallbladder surgery  02/16/2017   Fax from  [place] Bilateral 2002   rt shoulder surgery      I have reviewed the social history and family history with the patient and they are unchanged from previous note.  ALLERGIES:  is allergic to hydrocodone and sulfamethoxazole-trimethoprim.  MEDICATIONS:  Current Outpatient Medications  Medication Sig Dispense Refill   guaiFENesin-codeine 100-10 MG/5ML syrup Take 5 mLs by mouth every 6 (six) hours as needed for cough. 120 mL 0   alendronate (FOSAMAX) 70 MG tablet TAKE 1 TABLET BY MOUTH 1 TIME A WEEK WITH FULL GLASS OF WATER AND ON AN EMPTY STOMACH AS DIRECTED 4 tablet 2   butorphanol (STADOL) 10 MG/ML nasal spray Place 1 spray into the nose every 4 (four) hours as  needed for headache (trigeminal neuralgia.). 2.5 mL 0   Calcium Carb-Cholecalciferol (CALCIUM 600+D) 600-800 MG-UNIT TABS Take 1 tablet by mouth 2 (two) times daily.     carvedilol (COREG) 25 MG tablet TAKE 1 TABLET(25 MG) BY MOUTH TWICE DAILY WITH A MEAL 60 tablet 3   conjugated estrogens (PREMARIN) vaginal cream Place 1 applicator vaginally 2 (two) times a week.     doxycycline (VIBRAMYCIN) 100 MG capsule Take 100 mg by mouth 2 (two) times daily.     fluticasone (FLONASE) 50 MCG/ACT nasal spray Place 2 sprays into both nostrils as needed for allergies or rhinitis. 16 g 5   furosemide (LASIX) 40 MG tablet Take 1 tablet (40 mg total) by mouth daily. 30 tablet 3   HYDROcodone-acetaminophen (HYCET) 7.5-325 mg/15 ml solution Place 5 mLs into feeding tube every 6 (six) hours as needed for moderate pain. (Patient not taking: Reported on 04/13/2021) 120 mL 0   lamoTRIgine (LAMICTAL) 100 MG tablet Take by mouth.     lamotrigine (LAMICTAL) 25 MG disintegrating tablet Dissolve 2 tablets (50 mg) under tongue twice daily along with 100 mg for total dose of 150 mg twice daily. 120 tablet 3   lamoTRIgine 100 MG TBDP Dissolve 1 tablet (100 mg) under tongue twice daily along with 50 mg for total dose of 150 mg twice  daily. 60 tablet 3   Lisinopril 1 MG/ML SOLN Take 20 mg  daily Per g tube 600 mL 2   LORazepam (ATIVAN) 0.5 MG tablet Take 1 tablet by mouth as needed 30-60 minutes before radiation for anxiety 30 tablet 0   magic mouthwash w/lidocaine SOLN Take 5 mLs by mouth every 6 (six) hours as needed for mouth pain. 480 mL 1   Multiple Vitamin (MULTIVITAMIN ADULT PO) Take 1 tablet by mouth daily.     omeprazole (PRILOSEC) 20 MG capsule Take 1 capsule (20 mg total) by mouth daily. (Patient taking differently: Take 20 mg by mouth daily as needed (acid reflux).) 30 capsule 2   ondansetron (ZOFRAN) 4 MG tablet Take 1 tablet (4 mg total) by mouth daily as needed for nausea or vomiting. 30 tablet 1   ondansetron (ZOFRAN) 8 MG tablet Take 1 tablet (8 mg total) by mouth 2 (two) times daily as needed for refractory nausea / vomiting. Start on day 3 after chemo. 30 tablet 1   ondansetron (ZOFRAN-ODT) 4 MG disintegrating tablet Take 1-2 tablets (4-8 mg total) by mouth every 8 (eight) hours as needed for nausea. 60 tablet 3   OXcarbazepine (TRILEPTAL) 300 MG/5ML suspension Take 2.5 mLs (150 mg total) by mouth 2 (two) times daily. 250 mL 3   oxyCODONE (OXY IR/ROXICODONE) 5 MG immediate release tablet Take by mouth.     Polyethyl Glycol-Propyl Glycol (SYSTANE) 0.4-0.3 % GEL ophthalmic gel Place 1 application into both eyes 2 (two) times daily as needed (dry eyes).     prochlorperazine (COMPAZINE) 10 MG tablet Take 1 tablet (10 mg total) by mouth every 6 (six) hours as needed (Nausea or vomiting). 30 tablet 1   sucralfate (CARAFATE) 1 g tablet Take 1 tablet (1 g total) by mouth 4 (four) times daily. Dissolve each tablet in 15 cc water before use. 120 tablet 2   No current facility-administered medications for this visit.    PHYSICAL EXAMINATION: ECOG PERFORMANCE STATUS: 2 - Symptomatic, <50% confined to bed  There were no vitals filed for this visit.  Wt Readings from Last 3 Encounters:  04/13/21 128 lb (58.1 kg)  04/12/21 125 lb (56.7 kg)  04/06/21 125 lb (56.7 kg)     GENERAL:alert, no distress and comfortable SKIN: skin color, texture, turgor are normal, no significant lesions, (+) diffuse skin erythema and hyperpigmentation with scabs and peeling  in neck and upper chest EYES: normal, Conjunctiva are pink and non-injected, sclera clear  NECK: supple, thyroid normal size, non-tender, without nodularity LUNGS: clear to auscultation and percussion with normal breathing effort HEART: regular rate & rhythm and no murmurs and no lower extremity edema NEURO: alert & oriented x 3 with fluent speech, no focal motor/sensory deficits  LABORATORY DATA:  I have reviewed the data as listed CBC Latest Ref Rng & Units 04/13/2021 04/05/2021 04/01/2021  WBC 4.0 - 10.5 K/uL 2.4(L) 2.4(L) 2.8(L)  Hemoglobin 12.0 - 15.0 g/dL 10.9(L) 12.1 11.7(L)  Hematocrit 36.0 - 46.0 % 33.2(L) 37.3 36.1  Platelets 150 - 400 K/uL 96(L) 145(L) 169     CMP Latest Ref Rng & Units 04/13/2021 04/05/2021 04/01/2021  Glucose 70 - 99 mg/dL 144(H) 191(H) 139(H)  BUN 8 - 23 mg/dL 23 27(H) 23  Creatinine 0.44 - 1.00 mg/dL 0.67 0.80 0.71  Sodium 135 - 145 mmol/L 137 138 138  Potassium 3.5 - 5.1 mmol/L 4.4 4.2 4.5  Chloride 98 - 111 mmol/L 99 98 100  CO2 22 - 32 mmol/L _0 Calcium 8.9 - 10.3 mg/dL 9.3 9.8 9.4  Total Protein 6.5 - 8.1 g/dL 5.9(L) 6.5 6.1(L)  Total Bilirubin 0.3 - 1.2 mg/dL 0.5 0.6 0.8  Alkaline Phos 38 - 126 U/L 72 73 73  AST 15 - 41 U/L 15 14(L) 14(L)  ALT 0 - 44 U/L _1 RADIOGRAPHIC STUDIES: I have personally reviewed the radiological images as listed and agreed with the findings in the report. No results found.    No orders of the defined types were placed in this encounter.  All questions were answered. The patient knows to call the clinic with any problems, questions or concerns. No barriers to learning was detected. The total time spent in the appointment was 30 minutes.     [name], MD 04/13/2021  I, Wilburn Mylar, am acting as scribe for [name], MD.   I have reviewed the above documentation for accuracy and completeness, and I agree with the above.

## 2021-04-15 ENCOUNTER — Encounter: Payer: Self-pay | Admitting: Nurse Practitioner

## 2021-04-15 ENCOUNTER — Encounter: Payer: Self-pay | Admitting: Hematology

## 2021-04-16 ENCOUNTER — Telehealth: Payer: Self-pay | Admitting: Hematology

## 2021-04-16 NOTE — Telephone Encounter (Signed)
Scheduled follow-up appointment per 11/22 los. Patient is aware.

## 2021-04-18 ENCOUNTER — Other Ambulatory Visit: Payer: Self-pay | Admitting: Hematology

## 2021-04-19 ENCOUNTER — Encounter: Payer: Self-pay | Admitting: Nurse Practitioner

## 2021-04-19 ENCOUNTER — Encounter: Payer: Self-pay | Admitting: Hematology

## 2021-04-22 DIAGNOSIS — C159 Malignant neoplasm of esophagus, unspecified: Secondary | ICD-10-CM | POA: Diagnosis not present

## 2021-04-22 DIAGNOSIS — R1310 Dysphagia, unspecified: Secondary | ICD-10-CM | POA: Diagnosis not present

## 2021-04-28 NOTE — Progress Notes (Signed)
[name]   [phone] [phone]   Clinic Follow up Note   Patient Care Team: [name], [name], MD as PCP - General [name], MD as PCP - Cardiology (Cardiology) Cindie Crumbly (Neurosurgery) Irine Seal, MD as Attending Physician (Urology) Erline Levine, MD as Consulting Physician (Neurosurgery) [name], MD as Consulting Physician (Neurology) Viona Gilmore, [facility] as Pharmacist (Pharmacist) [name], MD as Consulting Physician (Gastroenterology) Carol Ada, MD as Consulting Physician (Gastroenterology) [name], MD as Consulting Physician (Otolaryngology) [name], MD as Consulting Physician (Hematology) Kyung Rudd, MD as Consulting Physician (Radiation Oncology) Kyung Rudd, MD as Consulting Physician (Radiation Oncology) 04/29/2021  CHIEF COMPLAINT: Follow-up esophagus cancer  SUMMARY OF ONCOLOGIC HISTORY: Oncology History  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  The base of tongue vallecula and epiglottis were normal.    Piriform sinuses were clear bilaterally and vocal cords were clear with  normal vocal mobility.  No structural abnormalities noted.   01/22/2021 Imaging   DG esophagus IMPRESSION: 1. Luminal narrowing in the high cervical esophagus over a 3 cm segment is highly concerning for esophageal neoplasm. Inflammatory process would be a secondary consideration. Luminal narrowing occurs approximately at the C5-C7 vertebral body level just below the glottis. Patient experienced several episodes of choking related to this luminal narrowing. Recommend expedient upper GI endoscopy for evaluation. 2. Distal thoracic esophagus and GE junction appear normal.   02/04/2021 Procedure   EGD by Dr. Carlean Purl impression- One mass-like severe stenosis was found in the  upper third of the esophagus. The stenosis was not traversed.   02/05/2021 Imaging   CT soft tissue neck w contrast IMPRESSION: Mass-like soft tissue thickening and mucosal hyperenhancement of the cervical esophagus and hypopharynx, spanning the C4-T1 levels, likely reflecting an esophageal malignancy. This measures up to 2.4 x 3.4 cm in transaxial dimensions, and 6.3 cm in craniocaudal dimension. Associated severe effacement of the esophageal lumen.   Centrally necrotic right paratracheal lymph node beneath the level of the mass, measuring 1.3 x 0.9 cm, and likely reflecting a site of nodal metastatic disease.   Additional lymph nodes along the posterior and inferior aspect of the right thyroid lobe (along the right aspect of the esophageal mass), which measure subcentimeter but are asymmetrically prominent and highly suspicious for additional sites of nodal metastatic disease.   02/12/2021 Pathology Results   FINAL MICROSCOPIC DIAGNOSIS:  A. ESOPHAGUS, UPPER CERVICAL #1, BIOPSY:  - Squamous cell carcinoma.  B. ESOPHAGUS, UPPER CERVICAL #2, BIOPSY:  - Squamous cell carcinoma.    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   Markedly hypermetabolic right upper paratracheal lymph node, compatible with metastatic disease.   Small right upper paratracheal and right level IIA lymph nodes with mild hypermetabolic activity, concerning for additional sites of nodal metastatic disease.   Aortic Atherosclerosis (ICD10-I70.0).   03/09/2021 -  Chemotherapy   Patient is  on Treatment Plan : ESOPHAGUS Carboplatin/PACLitaxel weekly x 6 weeks with  XRT         CURRENT THERAPY:  Concurrent chemo/RT with carbo/taxol and radiation starting 03/08/21, chemo started 03/09/21.  Completed 04/14/2021 Pending nivolumab    INTERVAL HISTORY: [name] returns for follow-up as scheduled.  Last seen 04/13/2021 for final 6 cycle of carbo/Taxol, she completed RT 04/14/2021.  She is here today to monitor her recovery from chemoradiation.  She continues to have laryngitis and has completely lost her voice.  She has involuntary cough that occasionally causes her to gag.  Cough medicine with codeine causes her to act "spooky," she does not tolerate it well per her and her husband.  Tylenol and OTC Tesson seem to work best.  Her husband monitors her breathing at night and notes it is occasionally shallow and "raspy".  She used his nebulizer once which seemed to help.  Her skin is improving but still red, there is also some erythema and dried drainage around the feeding tube.  She ran out of doxycycline.  She gets in 5 feedings per day.  Has occasional nausea and diarrhea but not bad.  Husband is worried she seems depressed and sleeps a lot lately.  She is not very active during the day, she has avoided people.  On a bad day the discomfort from her side effects is an 8 out of 10, currently she has a 6-7 with talking but better if she is not talking.  Denies fever, chills, chest pain, dyspnea, or other new complaints.   MEDICAL HISTORY:  Past Medical History:  Diagnosis Date   Abdominal pain 05/29/2013   s/p rx of cephalo resistant e coli   but last rx NG  now residular ?  bladder sx repeat cx sx rx to ty and uro consult    ADJ DISORDER WITH MIXED ANXIETY & DEPRESSED MOOD 03/03/2010   Qualifier: Diagnosis of  By: Angela Bill MD, [name]    Agent resistant to multiple antibiotics 05/29/2013   e coli   bu NG on fu.     Anemia    Anxiety    ARF (acute renal failure) ([place]) 03/12/2015    Cancer ([place])    Closed head injury 02/01/2011   from syncope and had scalp laceration  neg ct .     Closed head injury 5-6 yrs ago   Colitis 63/89/3734   Complication of anesthesia    migraine several hours after general anesthesia   Depression    Fatty liver    Gall stones 2016   see ct scan neg HIDA    GERD (gastroesophageal reflux disease)    Hearing aid worn    HOH (hard of hearing)    both ears   Hyperlipidemia    Hypertension    echo nl lv function  mild dilitation 2009   Kidney infection    few yrs ago in hospital   Medication side effect 09/02/2010   Poss muscle se of 10 crestor    Migraine    hypnic HA eval by Dr. 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    Pyelonephritis 03/12/2015   Sensation of pain in anesthetized distribution of trigeminal nerve    Syncope 02/01/2011   In shower on vacation  sustained head laceration  8 sutures Had ed visit neg head ct labs and x ray    Trigeminal neuralgia pain     SURGICAL HISTORY: Past Surgical History:  Procedure Laterality Date   ABDOMINAL HYSTERECTOMY  2002   tubal   BACK SURGERY     2 times, for sciatic nerve pain   CARDIAC CATHETERIZATION  2000   chest pains neg   CHOLECYSTECTOMY N/A 02/21/2017   Procedure: LAPAROSCOPIC CHOLECYSTECTOMY WITH INTRAOPERATIVE CHOLANGIOGRAM;  Surgeon: Armandina Gemma, MD;  Location: [facility];  Service: General;  Laterality: N/A;   COLONOSCOPY     multiple   CRANIOTOMY  12/09/2011   nerve decompression right trigeminal    DIRECT LARYNGOSCOPY N/A 02/12/2021   Procedure: DIRECT LARYNGOSCOPY AND BIOPSY POSSIBLE FROZEN;  Surgeon: [name], MD;  Location: [facility];  Service: ENT;  Laterality: N/A;   DOPPLER ECHOCARDIOGRAPHY  2009   nl lv function mild lv dilitation   EYE SURGERY Bilateral    ioc for catatracts   GASTROSTOMY N/A 02/26/2021   Procedure: OPEN GASTROSTOMY TUBE PLACEMENT;  Surgeon: Armandina Gemma, MD;  Location: [facility];   Service: General;  Laterality: N/A;   IR GASTROSTOMY TUBE MOD SED  02/22/2021   laparoscopic gallbladder surgery  02/16/2017   Fax from [place] Bilateral 2002   rt shoulder surgery      I have reviewed the social history and family history with the patient and they are unchanged from previous note.  ALLERGIES:  is allergic to hydrocodone and sulfamethoxazole-trimethoprim.  MEDICATIONS:  Current Outpatient Medications  Medication Sig Dispense Refill   acetaminophen-codeine 120-12 MG/5ML solution Take 5 mLs by mouth every 4 (four) hours as needed for moderate pain (Esophagus Cancer Pain). 473 mL 0   alendronate (FOSAMAX) 70 MG tablet TAKE 1 TABLET BY MOUTH 1 TIME A WEEK WITH FULL GLASS OF WATER AND ON AN EMPTY STOMACH AS DIRECTED 4 tablet 2   butorphanol (STADOL) 10 MG/ML nasal spray Place 1 spray into the nose every 4 (four) hours as needed for headache (trigeminal neuralgia.). 2.5 mL 0   Calcium Carb-Cholecalciferol (CALCIUM 600+D) 600-800 MG-UNIT TABS Take 1 tablet by mouth 2 (two) times daily.     carvedilol (COREG) 25 MG tablet TAKE 1 TABLET(25 MG) BY MOUTH TWICE DAILY WITH A MEAL 60 tablet 3   conjugated estrogens (PREMARIN) vaginal cream Place 1 applicator vaginally 2 (two) times a week.     doxycycline (VIBRAMYCIN) 100 MG capsule Take 1 capsule (100 mg total) by mouth 2 (two) times daily. 30 capsule 1   fluticasone (FLONASE) 50 MCG/ACT nasal spray Place 2 sprays into both nostrils as needed for allergies or rhinitis. 16 g 5   furosemide (LASIX) 40 MG tablet Take 1 tablet (40 mg total) by mouth daily. 30 tablet 3   guaiFENesin-codeine 100-10 MG/5ML syrup TAKE 5 ML BY MOUTH EVERY 6 HOURS AS NEEDED FOR COUGH 240 mL 0   lamoTRIgine (LAMICTAL) 100 MG tablet Take by mouth.     lamotrigine (LAMICTAL) 25 MG disintegrating tablet Dissolve 2 tablets (50 mg) under tongue twice daily along with 100 mg for total dose of 150 mg twice daily. 120 tablet 3   lamoTRIgine  100 MG TBDP Dissolve 1 tablet (100 mg) under tongue twice daily along with 50 mg for total dose of 150 mg twice daily. 60 tablet 3   Lisinopril 1 MG/ML SOLN Take 20 mg  daily Per g tube 600 mL 2   LORazepam (ATIVAN) 0.5 MG tablet Take 1 tablet by mouth as needed 30-60 minutes before radiation for anxiety 30 tablet 0   magic mouthwash w/lidocaine SOLN Take 5 mLs by mouth every 6 (six) hours as needed  for mouth pain. 480 mL 1   Multiple Vitamin (MULTIVITAMIN ADULT PO) Take 1 tablet by mouth daily.     omeprazole (PRILOSEC) 20 MG capsule Take 1 capsule (20 mg total) by mouth daily. (Patient taking differently: Take 20 mg by mouth daily as needed (acid reflux).) 30 capsule 2   ondansetron (ZOFRAN) 4 MG tablet Take 1 tablet (4 mg total) by mouth daily as needed for nausea or vomiting. 30 tablet 1   ondansetron (ZOFRAN) 8 MG tablet Take 1 tablet (8 mg total) by mouth 2 (two) times daily as needed for refractory nausea / vomiting. Start on day 3 after chemo. 30 tablet 1   ondansetron (ZOFRAN-ODT) 4 MG disintegrating tablet Take 1-2 tablets (4-8 mg total) by mouth every 8 (eight) hours as needed for nausea. 60 tablet 3   OXcarbazepine (TRILEPTAL) 300 MG/5ML suspension Take 2.5 mLs (150 mg total) by mouth 2 (two) times daily. 250 mL 3   Polyethyl Glycol-Propyl Glycol (SYSTANE) 0.4-0.3 % GEL ophthalmic gel Place 1 application into both eyes 2 (two) times daily as needed (dry eyes).     prochlorperazine (COMPAZINE) 10 MG tablet Take 1 tablet (10 mg total) by mouth every 6 (six) hours as needed (Nausea or vomiting). 30 tablet 1   sucralfate (CARAFATE) 1 g tablet Take 1 tablet (1 g total) by mouth 4 (four) times daily. Dissolve each tablet in 15 cc water before use. 120 tablet 2   No current facility-administered medications for this visit.    PHYSICAL EXAMINATION: ECOG PERFORMANCE STATUS: 2 - Symptomatic, <50% confined to bed  Vitals:   04/29/21 1205  BP: (!) 142/74  Pulse: 72  Resp: 20  Temp: 98 F  (36.7 C)  SpO2: 96%   Filed Weights   04/29/21 1205  Weight: 131 lb (59.4 kg)    GENERAL:alert, no distress and comfortable SKIN: radiation dermatitis at the neck/chest  EYES: sclera clear LUNGS: clear with normal breathing effort HEART: regular rate & rhythm, trace pedal edema ABDOMEN:abdomen soft, non-tender and normal bowel sounds. G tube with mild erythema and drainage  NEURO: alert & oriented x 3 with fluent speech, no focal motor/sensory deficits  LABORATORY DATA:  I have reviewed the data as listed CBC Latest Ref Rng & Units 04/29/2021 04/13/2021 04/05/2021  WBC 4.0 - 10.5 K/uL 2.4(L) 2.4(L) 2.4(L)  Hemoglobin 12.0 - 15.0 g/dL 10.3(L) 10.9(L) 12.1  Hematocrit 36.0 - 46.0 % 32.2(L) 33.2(L) 37.3  Platelets 150 - 400 K/uL 220 96(L) 145(L)     CMP Latest Ref Rng & Units 04/29/2021 04/13/2021 04/05/2021  Glucose 70 - 99 mg/dL 193(H) 144(H) 191(H)  BUN 8 - 23 mg/dL 16 23 27(H)  Creatinine 0.44 - 1.00 mg/dL 0.70 0.67 0.80  Sodium 135 - 145 mmol/L 136 137 138  Potassium 3.5 - 5.1 mmol/L 4.7 4.4 4.2  Chloride 98 - 111 mmol/L 99 99 98  CO2 22 - 32 mmol/L _0 Calcium 8.9 - 10.3 mg/dL 8.8(L) 9.3 9.8  Total Protein 6.5 - 8.1 g/dL 5.5(L) 5.9(L) 6.5  Total Bilirubin 0.3 - 1.2 mg/dL 0.6 0.5 0.6  Alkaline Phos 38 - 126 U/L 76 72 73  AST 15 - 41 U/L 17 15 14(L)  ALT 0 - 44 U/L _1 RADIOGRAPHIC STUDIES: I have personally reviewed the radiological images as listed and agreed with the findings in the report. No results found.   ASSESSMENT & PLAN: 80 yo female    1.Squamous cell  carcinoma of cervical esophagus  -She developed dysphagia and weight loss, work up showed 3x6 cm mass in the cervical/upper esophagus with possible local adenopathy. Path confirmed squamous cell carcinoma of the esophagus  -She had feeding tube placed by Dr. Harlow Asa 02/26/21  -PET scan shows hypermetabolic esophageal mass and hypermetabolic paratracheal nodes, negative to distant metastasis.  This is local disease  -despite local disease, due to the location in the cervical esophagus, Dr. Kipp Brood feels she is not a surgical candidate. Her case was reviewed in tumor board  -the consensus is chemoRT with carbo/taxol. She consented and attended chemo education teaching. Goal is curative -she has mild abnormal nerve sensation in the feet, no previous diagnosis of neuropathy. Will monitor closely on taxol.  -PDL1 testing is 95% positive, predicting a good response to immunotherapy which we plan to give after chemoRT for up to 2 years. Patient is interested. She may want a port a cath at some point, will discuss another day. We discussed briefly today, will hold until skin has healed better  -she completed concurrent chemoRT with carbo/taxol 03/08/21 (chemo 03/09/21) through 04/14/21. She required dose reduction with last cycle due to radiation dermatitis, hoarseness, dysphagia, and odynophagia.  -Ms. Ramakrishnan appears stable, slowly recovering. She continues to have dermatitis (improved), laryngitis, cough, and dysphagia. She did not tolerate codeine cough meds. Will get CXR today. She will try liquid tylenol and tussionex which work best for her. -she has gained weight. Labs and VS reviewed. Her PS is low lately during her recovery. I encouraged her to increase physical activity.  -we discussed potential benefit and SE's of nivolumab, she agrees to start.  -Continue to monitor recovery. Will do phone f/up in 2 weeks, then in person f/up to start Nivo in 3 weeks if she has recovered better.  2. Depression -secondary to social isolation and treatment side effects -encouraged her to increase physical activity, walk outside, use infection precautions to do low risk social outings -discussed anti-depressants, pt declined  -will monitor    3.Dysphagia, weight loss  -Secondary to #1 -She has not tolerated solids lately, only pureed or liquid diet with difficulty. Can change meds to liquid as  applicable  -She underwent surgical placement by Dr. Harlow Asa on 02/26/21 -continue tube feeds,  -refilled doxy for mild erythema and drainage at G tube site   4.Trigeminal neuralgia, sciatica, headaches -On lamotrigene and oxcarbazepine  -pain has worsened before and during treatment -f/up Dr. Jaynee Eagles   5.HTN -continue benazepril, furosemide - I reduced benazepril to once daily from 03/09/21, and let her know to hold lasix if she becomes dizzy at home   -monitoring closely    6.Goals of Care -Code status was DNR from hospitalization 05/16/20, changed to full code during 02/26/2021 hospitalization for feeding tube placement   -goal of chemoRT is curative    PLAN: -labs reviewed -CXR today -continue supportive care -refilled prophylactic doxy  -phone f/up in 2 weeks -return for lab, f/up, and start Nivolumab in 3 weeks if she has recovered well  Orders Placed This Encounter  Procedures   DG Chest 2 View    Standing Status:   Future    Number of Occurrences:   1    Standing Expiration Date:   04/29/2022    Order Specific Question:   Reason for Exam (SYMPTOM  OR DIAGNOSIS REQUIRED)    Answer:   cough, esoghagus cancer    Order Specific Question:   Preferred imaging location?    Answer:  [facility]   All questions were answered. The patient knows to call the clinic with any problems, questions or concerns. No barriers to learning was detected. I spent 20 minutes counseling the patient face to face. The total time spent in the appointment was 30 minutes and more than 50% was on counseling and review of test results     [name], NP 04/29/21

## 2021-04-29 ENCOUNTER — Inpatient Hospital Stay: Payer: Medicare Other | Admitting: Nurse Practitioner

## 2021-04-29 ENCOUNTER — Ambulatory Visit (HOSPITAL_COMMUNITY)
Admission: RE | Admit: 2021-04-29 | Discharge: 2021-04-29 | Disposition: A | Discharge status: Home / Self Care | Payer: Medicare Other | Source: Ambulatory Visit | Attending: Nurse Practitioner | Admitting: Nurse Practitioner

## 2021-04-29 ENCOUNTER — Inpatient Hospital Stay: Payer: Medicare Other | Attending: Nurse Practitioner

## 2021-04-29 ENCOUNTER — Other Ambulatory Visit: Payer: Self-pay

## 2021-04-29 ENCOUNTER — Encounter: Payer: Self-pay | Admitting: Nurse Practitioner

## 2021-04-29 VITALS — BP 142/74 | HR 72 | Temp 98.0°F | Resp 20 | Ht 66.0 in | Wt 131.0 lb

## 2021-04-29 DIAGNOSIS — Z79899 Other long term (current) drug therapy: Secondary | ICD-10-CM | POA: Insufficient documentation

## 2021-04-29 DIAGNOSIS — Z5112 Encounter for antineoplastic immunotherapy: Secondary | ICD-10-CM | POA: Insufficient documentation

## 2021-04-29 DIAGNOSIS — J439 Emphysema, unspecified: Secondary | ICD-10-CM | POA: Diagnosis not present

## 2021-04-29 DIAGNOSIS — C153 Malignant neoplasm of upper third of esophagus: Secondary | ICD-10-CM | POA: Insufficient documentation

## 2021-04-29 DIAGNOSIS — R059 Cough, unspecified: Secondary | ICD-10-CM

## 2021-04-29 LAB — CBC WITH DIFFERENTIAL (CANCER CENTER ONLY)
Abs Immature Granulocytes: 0.03 10*3/uL (ref 0.00–0.07)
Basophils Absolute: 0 10*3/uL (ref 0.0–0.1)
Basophils Relative: 0 %
Eosinophils Absolute: 0 10*3/uL (ref 0.0–0.5)
Eosinophils Relative: 1 %
HCT: 32.2 % — ABNORMAL LOW (ref 36.0–46.0)
Hemoglobin: 10.3 g/dL — ABNORMAL LOW (ref 12.0–15.0)
Immature Granulocytes: 1 %
Lymphocytes Relative: 12 %
Lymphs Abs: 0.3 10*3/uL — ABNORMAL LOW (ref 0.7–4.0)
MCH: 30.3 pg (ref 26.0–34.0)
MCHC: 32 g/dL (ref 30.0–36.0)
MCV: 94.7 fL (ref 80.0–100.0)
Monocytes Absolute: 0.3 10*3/uL (ref 0.1–1.0)
Monocytes Relative: 13 %
Neutro Abs: 1.7 10*3/uL (ref 1.7–7.7)
Neutrophils Relative %: 73 %
Platelet Count: 220 10*3/uL (ref 150–400)
RBC: 3.4 MIL/uL — ABNORMAL LOW (ref 3.87–5.11)
RDW: 18.9 % — ABNORMAL HIGH (ref 11.5–15.5)
WBC Count: 2.4 10*3/uL — ABNORMAL LOW (ref 4.0–10.5)
nRBC: 0 % (ref 0.0–0.2)

## 2021-04-29 LAB — CMP (CANCER CENTER ONLY)
ALT: 18 U/L (ref 0–44)
AST: 17 U/L (ref 15–41)
Albumin: 2.8 g/dL — ABNORMAL LOW (ref 3.5–5.0)
Alkaline Phosphatase: 76 U/L (ref 38–126)
Anion gap: 8 (ref 5–15)
BUN: 16 mg/dL (ref 8–23)
CO2: 29 mmol/L (ref 22–32)
Calcium: 8.8 mg/dL — ABNORMAL LOW (ref 8.9–10.3)
Chloride: 99 mmol/L (ref 98–111)
Creatinine: 0.7 mg/dL (ref 0.44–1.00)
GFR, Estimated: >60 mL/min (ref >60)
Glucose, Bld: 193 mg/dL — ABNORMAL HIGH (ref 70–99)
Potassium: 4.7 mmol/L (ref 3.5–5.1)
Sodium: 136 mmol/L (ref 135–145)
Total Bilirubin: 0.6 mg/dL (ref 0.3–1.2)
Total Protein: 5.5 g/dL — ABNORMAL LOW (ref 6.5–8.1)

## 2021-04-29 MED ORDER — DOXYCYCLINE HYCLATE 100 MG PO CAPS: 100.0000 mg | ORAL_CAPSULE | Freq: Two times a day (BID) | ORAL | 1 refills | Status: DC

## 2021-04-30 ENCOUNTER — Other Ambulatory Visit: Payer: Self-pay | Admitting: Internal Medicine

## 2021-04-30 ENCOUNTER — Ambulatory Visit (HOSPITAL_BASED_OUTPATIENT_CLINIC_OR_DEPARTMENT_OTHER): Payer: Medicare Other | Admitting: Cardiology

## 2021-05-06 ENCOUNTER — Other Ambulatory Visit: Payer: Self-pay

## 2021-05-06 DIAGNOSIS — G500 Trigeminal neuralgia: Secondary | ICD-10-CM | POA: Diagnosis not present

## 2021-05-06 DIAGNOSIS — C159 Malignant neoplasm of esophagus, unspecified: Secondary | ICD-10-CM | POA: Diagnosis not present

## 2021-05-06 DIAGNOSIS — Z923 Personal history of irradiation: Secondary | ICD-10-CM | POA: Diagnosis not present

## 2021-05-06 DIAGNOSIS — Z9889 Other specified postprocedural states: Secondary | ICD-10-CM | POA: Diagnosis not present

## 2021-05-11 ENCOUNTER — Telehealth: Payer: Self-pay | Admitting: Nutrition

## 2021-05-11 ENCOUNTER — Encounter: Payer: Medicare Other | Admitting: Nutrition

## 2021-05-11 NOTE — Telephone Encounter (Signed)
Contacted patient by telephone for nutrition follow-up.  Patient has completed chemoradiation therapy for esophageal cancer.  There was no answer.  Left message with name and phone number for return call.

## 2021-05-11 NOTE — Telephone Encounter (Signed)
Patient's husband [name] returned phone call. Patient tolerating 5 cartons of Osmolite 1.5 providing 1775 cal, 74.5 g protein, 1805 mL free water. Last weight documented was 131 pounds on December 8. Noted labs: Glucose 193 and albumin 2.8. Reports feeding tube was painful at 1 time but this has resolved after speaking with surgeon. Patient has occasional nausea. Denies diarrhea or constipation.  Estimated nutrition needs: 1660-1825 cal, 72-88 g protein, 1.7 L fluid.  5 cartons Osmolite 1.5 provides 1775 cal, 74.5 g protein, 1805 mL free water.  This is 100% estimated nutrition needs.  Nutrition diagnosis: Inadequate oral intake continues.  Intervention: Continue Osmolite 1.5, 1 carton 5 times a day with free water flushes of 60 mL before and 120 mL after bolus feedings. Continue fluids by mouth as tolerated. Encouraged to contact RD with further questions or concerns.  Monitoring, evaluation, goals: Tube feeding tolerance, weight maintenance.  Next visit: To be scheduled as needed.  **Disclaimer: This note was dictated with voice recognition software. Similar sounding words can inadvertently be transcribed and this note may contain transcription errors which may not have been corrected upon publication of note.**

## 2021-05-12 NOTE — Progress Notes (Signed)
[name]   [phone] Fax:([phone]   Clinic Follow up Note   Patient Care Team: [name], [name], MD as PCP - General [name], MD as PCP - Cardiology (Cardiology) Cindie Crumbly (Neurosurgery) Irine Seal, MD as Attending Physician (Urology) Erline Levine, MD as Consulting Physician (Neurosurgery) [name], MD as Consulting Physician (Neurology) Viona Gilmore, [facility] as Pharmacist (Pharmacist) [name], MD as Consulting Physician (Gastroenterology) Carol Ada, MD as Consulting Physician (Gastroenterology) [name], MD as Consulting Physician (Otolaryngology) [name], MD as Consulting Physician (Hematology) Kyung Rudd, MD as Consulting Physician (Radiation Oncology) Kyung Rudd, MD as Consulting Physician (Radiation Oncology) 05/13/2021   I connected with [name] on 05/13/21 at  9:00 AM EST by telephone visit and verified that I am speaking with the correct person using two identifiers.   I discussed the limitations, risks, security and privacy concerns of performing an evaluation and management service by telemedicine and the availability of in-person appointments. I also discussed with the patient that there may be a patient responsible charge related to this service. The patient expressed understanding and agreed to proceed.   Other persons participating in the visit and their role in the encounter: spouse   Patients location: home Providers location: [place] office   CHIEF COMPLAINT: Follow up esophageal cancer, chemoRT toxicities  SUMMARY OF ONCOLOGIC HISTORY: Oncology History  Malignant neoplasm of upper third esophagus ([name])  12/21/2020 Imaging   Laryngoscopy Comments:    On fiberoptic laryngoscopy through the right nostril the nasopharynx was clear.  The base of tongue vallecula and epiglottis were normal.    Piriform sinuses were clear bilaterally and vocal cords were clear  with  normal vocal mobility.  No structural abnormalities noted.   01/22/2021 Imaging   DG esophagus IMPRESSION: 1. Luminal narrowing in the high cervical esophagus over a 3 cm segment is highly concerning for esophageal neoplasm. Inflammatory process would be a secondary consideration. Luminal narrowing occurs approximately at the C5-C7 vertebral body level just below the glottis. Patient experienced several episodes of choking related to this luminal narrowing. Recommend expedient upper GI endoscopy for evaluation. 2. Distal thoracic esophagus and GE junction appear normal.   02/04/2021 Procedure   EGD by Dr. Carlean Purl impression- One mass-like severe stenosis was found in the upper third of the esophagus. The stenosis was not traversed.   02/05/2021 Imaging   CT soft tissue neck w contrast IMPRESSION: Mass-like soft tissue thickening and mucosal hyperenhancement of the cervical esophagus and hypopharynx, spanning the C4-T1 levels, likely reflecting an esophageal malignancy. This measures up to 2.4 x 3.4 cm in transaxial dimensions, and 6.3 cm in craniocaudal dimension. Associated severe effacement of the esophageal lumen.   Centrally necrotic right paratracheal lymph node beneath the level of the mass, measuring 1.3 x 0.9 cm, and likely reflecting a site of nodal metastatic disease.   Additional lymph nodes along the posterior and inferior aspect of the right thyroid lobe (along the right aspect of the esophageal mass), which measure subcentimeter but are asymmetrically prominent and highly suspicious for additional sites of nodal metastatic disease.   02/12/2021 Pathology Results   FINAL MICROSCOPIC DIAGNOSIS:  A. ESOPHAGUS, UPPER CERVICAL #1, BIOPSY:  - Squamous cell carcinoma.  B. ESOPHAGUS, UPPER CERVICAL #2, BIOPSY:  - Squamous cell carcinoma.    02/12/2021 Cancer Staging   Staging form: Esophagus - Squamous Cell Carcinoma, AJCC 8th Edition - Clinical stage from 02/12/2021: Stage Unknown  (cTX, cN2, cM0) -  Signed by [name], MD on 03/08/2021 Stage prefix: Initial diagnosis    02/18/2021 Initial Diagnosis   Malignant neoplasm of upper third esophagus ([place])   02/23/2021 PET scan   IMPRESSION: Markedly hypermetabolic focal masslike thickening of the cervical esophagus, compatible with primary esophageal malignancy.   Markedly hypermetabolic right upper paratracheal lymph node, compatible with metastatic disease.   Small right upper paratracheal and right level IIA lymph nodes with mild hypermetabolic activity, concerning for additional sites of nodal metastatic disease.   Aortic Atherosclerosis (ICD10-I70.0).   03/09/2021 -  Chemotherapy   Patient is on Treatment Plan : ESOPHAGUS Carboplatin/PACLitaxel weekly x 6 weeks with XRT         CURRENT THERAPY:  Concurrent chemo/RT with carbo/taxol and radiation starting 03/08/21, chemo started 03/09/21.  Completed 04/14/2021 Pending nivolumab  INTERVAL HISTORY: [name] presents virtually as scheduled. Last seen in office 04/29/21.  Her hoarseness has improved but not resolved, so [name] is doing most of the talking however I can hear that her voice is stronger.  She still has sore throat with swallowing and talking, but also improved.  She is drinking more, she feels her weight is stable.  She is still coughing, more after a tube feeding, and at night.  [name] is helping.  They spoke to Dr. Lynford Humphrey about her G-tube who told them drainage is okay, patient denies signs of infection.  Radiation dermatitis has mostly resolved, no scabs, they report her skin is "like baby's new skin."  Applying bacitracin and butter cream.  She remains tired but activity has improved, she is walking circles inside her house and riding along for errands.  She plans to go shopping for a wig today.  Denies fever or chills or any other new symptoms.   MEDICAL HISTORY:  Past Medical History:  Diagnosis Date   Abdominal pain 05/29/2013   s/p  rx of cephalo resistant e coli   but last rx NG  now residular ?  bladder sx repeat cx sx rx to ty and uro consult    ADJ DISORDER WITH MIXED ANXIETY & DEPRESSED MOOD 03/03/2010   Qualifier: Diagnosis of  By: [name], [name]    Agent resistant to multiple antibiotics 05/29/2013   e coli   bu NG on fu.     Anemia    Anxiety    ARF (acute renal failure) ([place]) 03/12/2015   Cancer ([place])    Closed head injury 02/01/2011   from syncope and had scalp laceration  neg ct .     Closed head injury 5-6 yrs ago   Colitis 60/73/7106   Complication of anesthesia    migraine several hours after general anesthesia   Depression    Fatty liver    Gall stones 2016   see ct scan neg HIDA    GERD (gastroesophageal reflux disease)    Hearing aid worn    HOH (hard of hearing)    both ears   Hyperlipidemia    Hypertension    echo nl lv function  mild dilitation 2009   Kidney infection    few yrs ago in hospital   Medication side effect 09/02/2010   Poss muscle se of 10 crestor    Migraine    hypnic HA eval by Dr. 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    Pyelonephritis 03/12/2015   Sensation of pain in anesthetized distribution of trigeminal nerve  Syncope 02/01/2011   In shower on vacation  sustained head laceration  8 sutures Had ed visit neg head ct labs and x ray    Trigeminal neuralgia pain     SURGICAL HISTORY: Past Surgical History:  Procedure Laterality Date   ABDOMINAL HYSTERECTOMY  2002   tubal   BACK SURGERY     2 times, for sciatic nerve pain   CARDIAC CATHETERIZATION  2000   chest pains neg   CHOLECYSTECTOMY N/A 02/21/2017   Procedure: LAPAROSCOPIC CHOLECYSTECTOMY WITH INTRAOPERATIVE CHOLANGIOGRAM;  Surgeon: Armandina Gemma, MD;  Location: [facility];  Service: General;  Laterality: N/A;   COLONOSCOPY     multiple   CRANIOTOMY  12/09/2011   nerve decompression right trigeminal    DIRECT LARYNGOSCOPY N/A 02/12/2021   Procedure: DIRECT LARYNGOSCOPY  AND BIOPSY POSSIBLE FROZEN;  Surgeon: [name], MD;  Location: [facility];  Service: ENT;  Laterality: N/A;   DOPPLER ECHOCARDIOGRAPHY  2009   nl lv function mild lv dilitation   EYE SURGERY Bilateral    ioc for catatracts   GASTROSTOMY N/A 02/26/2021   Procedure: OPEN GASTROSTOMY TUBE PLACEMENT;  Surgeon: Armandina Gemma, MD;  Location: [facility];  Service: General;  Laterality: N/A;   IR GASTROSTOMY TUBE MOD SED  02/22/2021   laparoscopic gallbladder surgery  02/16/2017   Fax from [facility] Bilateral 2002   rt shoulder surgery      I have reviewed the social history and family history with the patient and they are unchanged from previous note.  ALLERGIES:  is allergic to hydrocodone and sulfamethoxazole-trimethoprim.  MEDICATIONS:  Current Outpatient Medications  Medication Sig Dispense Refill   acetaminophen-codeine 120-12 MG/5ML solution Take 5 mLs by mouth every 4 (four) hours as needed for moderate pain (Esophagus Cancer Pain). 473 mL 0   pantoprazole (PROTONIX) 20 MG tablet Take 1 tablet (20 mg total) by mouth daily. 30 tablet 1   alendronate (FOSAMAX) 70 MG tablet TAKE 1 TABLET BY MOUTH 1 TIME A WEEK WITH FULL GLASS OF WATER AND ON AN EMPTY STOMACH AS DIRECTED 4 tablet 2   butorphanol (STADOL) 10 MG/ML nasal spray Place 1 spray into the nose every 4 (four) hours as needed for headache (trigeminal neuralgia.). 2.5 mL 0   Calcium Carb-Cholecalciferol (CALCIUM 600+D) 600-800 MG-UNIT TABS Take 1 tablet by mouth 2 (two) times daily.     carvedilol (COREG) 25 MG tablet TAKE 1 TABLET(25 MG) BY MOUTH TWICE DAILY WITH A MEAL 60 tablet 3   conjugated estrogens (PREMARIN) vaginal cream Place 1 applicator vaginally 2 (two) times a week.     doxycycline (VIBRAMYCIN) 100 MG capsule Take 1 capsule (100 mg total) by mouth 2 (two) times daily. 30 capsule 1   fluticasone (FLONASE) 50 MCG/ACT nasal spray Place 2 sprays into both nostrils as needed  for allergies or rhinitis. 16 g 5   furosemide (LASIX) 40 MG tablet Take 1 tablet (40 mg total) by mouth daily. 30 tablet 3   guaiFENesin-codeine 100-10 MG/5ML syrup TAKE 5 ML BY MOUTH EVERY 6 HOURS AS NEEDED FOR COUGH 240 mL 0   lamoTRIgine (LAMICTAL) 100 MG tablet Take by mouth.     lamotrigine (LAMICTAL) 25 MG disintegrating tablet Dissolve 2 tablets (50 mg) under tongue twice daily along with 100 mg for total dose of 150 mg twice daily. 120 tablet 3   lamoTRIgine 100 MG TBDP Dissolve 1 tablet (100 mg) under tongue twice daily along with 50 mg  for total dose of 150 mg twice daily. 60 tablet 3   Lisinopril 1 MG/ML SOLN Take 20 mg  daily Per g tube 600 mL 2   LORazepam (ATIVAN) 0.5 MG tablet Take 1 tablet by mouth as needed 30-60 minutes before radiation for anxiety 30 tablet 0   magic mouthwash w/lidocaine SOLN Take 5 mLs by mouth every 6 (six) hours as needed for mouth pain. 480 mL 1   Multiple Vitamin (MULTIVITAMIN ADULT PO) Take 1 tablet by mouth daily.     ondansetron (ZOFRAN) 4 MG tablet Take 1 tablet (4 mg total) by mouth daily as needed for nausea or vomiting. 30 tablet 1   ondansetron (ZOFRAN) 8 MG tablet Take 1 tablet (8 mg total) by mouth 2 (two) times daily as needed for refractory nausea / vomiting. Start on day 3 after chemo. 30 tablet 1   ondansetron (ZOFRAN-ODT) 4 MG disintegrating tablet Take 1-2 tablets (4-8 mg total) by mouth every 8 (eight) hours as needed for nausea. 60 tablet 3   OXcarbazepine (TRILEPTAL) 300 MG/5ML suspension Take 2.5 mLs (150 mg total) by mouth 2 (two) times daily. 250 mL 3   Polyethyl Glycol-Propyl Glycol (SYSTANE) 0.4-0.3 % GEL ophthalmic gel Place 1 application into both eyes 2 (two) times daily as needed (dry eyes).     prochlorperazine (COMPAZINE) 10 MG tablet Take 1 tablet (10 mg total) by mouth every 6 (six) hours as needed (Nausea or vomiting). 30 tablet 1   sucralfate (CARAFATE) 1 g tablet Take 1 tablet (1 g total) by mouth 4 (four) times daily.  Dissolve each tablet in 15 cc water before use. 120 tablet 2   No current facility-administered medications for this visit.    PHYSICAL EXAMINATION: ECOG PERFORMANCE STATUS: 1 - Symptomatic but completely ambulatory  There were no vitals filed for this visit. There were no vitals filed for this visit.  Patient sounds hoarse but well over the phone.  She does cough.  No conversational dyspnea.  LABORATORY DATA:  I have reviewed the data as listed CBC Latest Ref Rng & Units 04/29/2021 04/13/2021 04/05/2021  WBC 4.0 - 10.5 K/uL 2.4(L) 2.4(L) 2.4(L)  Hemoglobin 12.0 - 15.0 g/dL 10.3(L) 10.9(L) 12.1  Hematocrit 36.0 - 46.0 % 32.2(L) 33.2(L) 37.3  Platelets 150 - 400 K/uL 220 96(L) 145(L)     CMP Latest Ref Rng & Units 04/29/2021 04/13/2021 04/05/2021  Glucose 70 - 99 mg/dL 193(H) 144(H) 191(H)  BUN 8 - 23 mg/dL 16 23 27(H)  Creatinine 0.44 - 1.00 mg/dL 0.70 0.67 0.80  Sodium 135 - 145 mmol/L 136 137 138  Potassium 3.5 - 5.1 mmol/L 4.7 4.4 4.2  Chloride 98 - 111 mmol/L 99 99 98  CO2 22 - 32 mmol/L _0 Calcium 8.9 - 10.3 mg/dL 8.8(L) 9.3 9.8  Total Protein 6.5 - 8.1 g/dL 5.5(L) 5.9(L) 6.5  Total Bilirubin 0.3 - 1.2 mg/dL 0.6 0.5 0.6  Alkaline Phos 38 - 126 U/L 76 72 73  AST 15 - 41 U/L 17 15 14(L)  ALT 0 - 44 U/L _1 RADIOGRAPHIC STUDIES: I have personally reviewed the radiological images as listed and agreed with the findings in the report. No results found.   ASSESSMENT & PLAN: 80 yo female    1.Squamous cell carcinoma of cervical esophagus  -She developed dysphagia and weight loss, work up showed 3x6 cm mass in the cervical/upper esophagus with possible local adenopathy. Path confirmed squamous cell carcinoma  of the esophagus  -She had feeding tube placed by Dr. Harlow Asa 02/26/21  -PET scan shows hypermetabolic esophageal mass and hypermetabolic paratracheal nodes, negative to distant metastasis. This is local disease  -despite local disease, due to the  location in the cervical esophagus, Dr. Kipp Brood feels she is not a surgical candidate. Her case was reviewed in tumor board  -the consensus is chemoRT with carbo/taxol. She consented and attended chemo education teaching. Goal is curative -she has mild abnormal nerve sensation in the feet, no previous diagnosis of neuropathy. Will monitor closely on taxol.  -PDL1 testing is 95% positive, predicting a good response to immunotherapy which we plan to give after chemoRT for up to 2 years. Patient is interested. She may want a port a cath at some point, will discuss another day. We discussed briefly today, will hold until skin has healed better  -she completed concurrent chemoRT with carbo/taxol 03/08/21 (chemo 03/09/21) through 04/14/21. She required dose reduction with last cycle due to radiation dermatitis, hoarseness, dysphagia, and odynophagia.  -on 04/29/21 she still had severe dermatitis and laryngitis, dysphagia, and cough (CXR negative). She was recovering slowly  -pending start nivolumab once she recovers   2. Depression -secondary to social isolation and treatment side effects -previously discussed anti-depressants, pt declined  -since last visit she has increased physical activity and riding along for errands with her husband -appears improved, will monitor    3.Dysphagia, weight loss  -Secondary to #1 -She has not tolerated solids lately, only pureed or liquid diet with difficulty. Can change meds to liquid as applicable  -She underwent surgical placement by Dr. Harlow Asa on 02/26/21 -continue tube feeds,  -refilled doxy for mild erythema and drainage at G tube site   4.Trigeminal neuralgia, sciatica, headaches -On lamotrigene and oxcarbazepine  -pain has worsened before and during treatment -f/up Dr. Jaynee Eagles    5.HTN -continue benazepril, furosemide - I reduced benazepril to once daily from 03/09/21, and let her know to hold lasix if she becomes dizzy at home   -monitoring closely     6.Goals of Care -Code status was DNR from hospitalization 05/16/20, changed to full code during 02/26/2021 hospitalization for feeding tube placement   -goal of chemoRT is curative    Disposition:  Ms. Procida appears to be recovering from chemo RT.  Hoarseness, odynophagia, and cough have improved.  We will add PPI for cough.  Per patient her dermatitis has nearly resolved.  Her performance status has improved.  I encouraged her to continue supportive care at home.  I faxed a prescription for wig to [facility].  She can take biotin or hair/nail vitamin to promote hair growth.  The plan is to return for lab, follow-up, and start nivolumab on 05/21/2021.    Orders Placed This Encounter  Procedures   PR CRANIAL PROSTHESIS    Chemo-related hair loss    I discussed the assessment and treatment plan with the patient. The patient was provided an opportunity to ask questions and all were answered. The patient agreed with the plan and demonstrated an understanding of the instructions.   The patient was advised to call back or seek an in-person evaluation if the symptoms worsen or if the condition fails to improve as anticipated. I spent 15 minutes of non- face-to-face time in today's encounter.    [name], NP 05/13/21

## 2021-05-13 ENCOUNTER — Telehealth: Payer: Self-pay

## 2021-05-13 ENCOUNTER — Encounter: Payer: Self-pay | Admitting: Nurse Practitioner

## 2021-05-13 ENCOUNTER — Other Ambulatory Visit: Payer: Self-pay | Admitting: Nurse Practitioner

## 2021-05-13 ENCOUNTER — Inpatient Hospital Stay (HOSPITAL_BASED_OUTPATIENT_CLINIC_OR_DEPARTMENT_OTHER): Payer: Medicare Other | Admitting: Nurse Practitioner

## 2021-05-13 ENCOUNTER — Other Ambulatory Visit: Payer: Self-pay

## 2021-05-13 DIAGNOSIS — I10 Essential (primary) hypertension: Secondary | ICD-10-CM

## 2021-05-13 DIAGNOSIS — G500 Trigeminal neuralgia: Secondary | ICD-10-CM

## 2021-05-13 DIAGNOSIS — C153 Malignant neoplasm of upper third of esophagus: Secondary | ICD-10-CM | POA: Diagnosis not present

## 2021-05-13 DIAGNOSIS — R1310 Dysphagia, unspecified: Secondary | ICD-10-CM | POA: Diagnosis not present

## 2021-05-13 DIAGNOSIS — F32A Depression, unspecified: Secondary | ICD-10-CM

## 2021-05-13 DIAGNOSIS — R059 Cough, unspecified: Secondary | ICD-10-CM | POA: Diagnosis not present

## 2021-05-13 DIAGNOSIS — M5430 Sciatica, unspecified side: Secondary | ICD-10-CM

## 2021-05-13 DIAGNOSIS — R634 Abnormal weight loss: Secondary | ICD-10-CM

## 2021-05-13 DIAGNOSIS — R519 Headache, unspecified: Secondary | ICD-10-CM

## 2021-05-13 MED ORDER — PANTOPRAZOLE SODIUM 20 MG PO TBEC: 20.0000 mg | DELAYED_RELEASE_TABLET | Freq: Every day | ORAL | 1 refills | Status: DC

## 2021-05-13 NOTE — Telephone Encounter (Signed)
Got an order from [facility] indicating a cranial Prothesis, for [facility]. Contacted [facility], they referred me to a [facility] who handles prosthetic wigs. Contacted [facility] ([phone]) the lady that handles is not in the office at this time. They will give Cira Rue NP office a call back.

## 2021-05-14 ENCOUNTER — Ambulatory Visit (HOSPITAL_BASED_OUTPATIENT_CLINIC_OR_DEPARTMENT_OTHER): Payer: Medicare Other | Admitting: Cardiology

## 2021-05-18 ENCOUNTER — Other Ambulatory Visit: Payer: Self-pay | Admitting: Internal Medicine

## 2021-05-19 ENCOUNTER — Other Ambulatory Visit: Payer: Self-pay | Admitting: Hematology

## 2021-05-19 ENCOUNTER — Other Ambulatory Visit: Payer: Self-pay

## 2021-05-19 DIAGNOSIS — C153 Malignant neoplasm of upper third of esophagus: Secondary | ICD-10-CM

## 2021-05-19 NOTE — Progress Notes (Signed)
DISCONTINUE ON PATHWAY REGIMEN - Gastroesophageal     Administer weekly during RT:     Paclitaxel      Carboplatin   **Always confirm dose/schedule in your pharmacy ordering system**  REASON: Other Reason PRIOR TREATMENT: [id number]: Carboplatin + Paclitaxel (2/50) Weekly (x 5-6 Weeks) with Concurrent RT TREATMENT RESPONSE: Partial Response (PR)  START ON PATHWAY REGIMEN - Gastroesophageal     Cycles 1 through 8: A cycle is every 14 days:     Nivolumab    Cycles 9 and beyond: A cycle is every 28 days:     Nivolumab   **Always confirm dose/schedule in your pharmacy ordering system**  Patient Characteristics: Esophageal & GE Junction, Squamous Cell, Post-Neoadjuvant Therapy and Resection (Neoadjuvant Pathologic Staging), ypT+ or ypN+ Histology: Squamous Cell Disease Classification: Esophageal Therapeutic Status: Post-Neoadjuvant Therapy and Resection (Neoadjuvant Pathologic Staging) AJCC M Category: cM0 AJCC 8 Stage Grouping: Unknown AJCC N Category: ypNX AJCC Grade: G2 AJCC T Category: ypT2 AJCC Location: Upper Intent of Therapy: Curative Intent, Discussed with Patient

## 2021-05-21 ENCOUNTER — Inpatient Hospital Stay: Payer: Medicare Other

## 2021-05-21 ENCOUNTER — Inpatient Hospital Stay: Payer: Medicare Other | Admitting: Hematology

## 2021-05-21 ENCOUNTER — Encounter: Payer: Self-pay | Admitting: Hematology

## 2021-05-21 ENCOUNTER — Other Ambulatory Visit: Payer: Self-pay

## 2021-05-21 VITALS — BP 114/62 | HR 80 | Temp 97.4°F | Resp 18 | Ht 66.0 in | Wt 125.6 lb

## 2021-05-21 DIAGNOSIS — C153 Malignant neoplasm of upper third of esophagus: Secondary | ICD-10-CM | POA: Diagnosis not present

## 2021-05-21 DIAGNOSIS — Z79899 Other long term (current) drug therapy: Secondary | ICD-10-CM | POA: Diagnosis not present

## 2021-05-21 DIAGNOSIS — Z5112 Encounter for antineoplastic immunotherapy: Secondary | ICD-10-CM | POA: Diagnosis not present

## 2021-05-21 LAB — CMP (CANCER CENTER ONLY)
ALT: 19 U/L (ref 0–44)
AST: 21 U/L (ref 15–41)
Albumin: 3.5 g/dL (ref 3.5–5.0)
Alkaline Phosphatase: 78 U/L (ref 38–126)
Anion gap: 6 (ref 5–15)
BUN: 28 mg/dL — ABNORMAL HIGH (ref 8–23)
CO2: 32 mmol/L (ref 22–32)
Calcium: 9.9 mg/dL (ref 8.9–10.3)
Chloride: 99 mmol/L (ref 98–111)
Creatinine: 0.74 mg/dL (ref 0.44–1.00)
GFR, Estimated: >60 mL/min (ref >60)
Glucose, Bld: 107 mg/dL — ABNORMAL HIGH (ref 70–99)
Potassium: 4.3 mmol/L (ref 3.5–5.1)
Sodium: 137 mmol/L (ref 135–145)
Total Bilirubin: 0.6 mg/dL (ref 0.3–1.2)
Total Protein: 6.5 g/dL (ref 6.5–8.1)

## 2021-05-21 LAB — CBC WITH DIFFERENTIAL (CANCER CENTER ONLY)
Abs Immature Granulocytes: 0.03 10*3/uL (ref 0.00–0.07)
Basophils Absolute: 0 10*3/uL (ref 0.0–0.1)
Basophils Relative: 0 %
Eosinophils Absolute: 0.1 10*3/uL (ref 0.0–0.5)
Eosinophils Relative: 1 %
HCT: 39.3 % (ref 36.0–46.0)
Hemoglobin: 13.2 g/dL (ref 12.0–15.0)
Immature Granulocytes: 0 %
Lymphocytes Relative: 11 %
Lymphs Abs: 0.7 10*3/uL (ref 0.7–4.0)
MCH: 31.1 pg (ref 26.0–34.0)
MCHC: 33.6 g/dL (ref 30.0–36.0)
MCV: 92.5 fL (ref 80.0–100.0)
Monocytes Absolute: 0.7 10*3/uL (ref 0.1–1.0)
Monocytes Relative: 10 %
Neutro Abs: 5.3 10*3/uL (ref 1.7–7.7)
Neutrophils Relative %: 78 %
Platelet Count: 227 10*3/uL (ref 150–400)
RBC: 4.25 MIL/uL (ref 3.87–5.11)
RDW: 17.8 % — ABNORMAL HIGH (ref 11.5–15.5)
WBC Count: 6.8 10*3/uL (ref 4.0–10.5)
nRBC: 0 % (ref 0.0–0.2)

## 2021-05-21 LAB — TSH: TSH: 0.548 u[IU]/mL (ref 0.308–3.960)

## 2021-05-21 MED ORDER — SODIUM CHLORIDE 0.9 % IV SOLN: Freq: Once | INTRAVENOUS | Status: AC

## 2021-05-21 MED ORDER — SODIUM CHLORIDE 0.9 % IV SOLN
240.0000 mg | Freq: Once | INTRAVENOUS | Status: AC
  Administered 2021-05-21: 16:00:00 240 mg via INTRAVENOUS
  Filled 2021-05-21: qty 24

## 2021-05-21 NOTE — Progress Notes (Signed)
[facility]   [phone] Fax:([phone]   Clinic Follow up Note   Patient Care Team: [name], [name], MD as PCP - General [name], MD as PCP - Cardiology (Cardiology) Cindie Crumbly (Neurosurgery) Irine Seal, MD as Attending Physician (Urology) Erline Levine, MD as Consulting Physician (Neurosurgery) [name], MD as Consulting Physician (Neurology) Viona Gilmore, [facility] as Pharmacist (Pharmacist) [name], MD as Consulting Physician (Gastroenterology) Carol Ada, MD as Consulting Physician (Gastroenterology) [name], MD (Inactive) as Consulting Physician (Otolaryngology) [name], MD as Consulting Physician (Hematology) Kyung Rudd, MD as Consulting Physician (Radiation Oncology) Kyung Rudd, MD as Consulting Physician (Radiation Oncology)  Date of Service:  05/21/2021  CHIEF COMPLAINT: f/u of esophageal cancer  CURRENT THERAPY:  PENDING Nivolumab, to start ~06/04/21  ASSESSMENT & PLAN:  [name] is a 80 y.o. female with   1.Squamous cell carcinoma of cervical esophagus, cTxN2M0 -presented with dysphagia and weight loss. EGD 9/15 and CT neck 02/05/21 showed 6 cm mass in the cervical/upper esophagus with possible local adenopathy. Path confirmed squamous cell carcinoma of the esophagus, PDL1 testing 95% positive -She had feeding tube placed by Dr. Harlow Asa 02/22/21  -PET scan 02/23/21 shows hypermetabolic esophageal mass and hypermetabolic paratracheal nodes, negative to distant metastasis. This is local disease  -due to the location in the cervical esophagus, Dr. Kipp Brood feels she is not a surgical candidate.  -she received concurrent chemoRT with carbo/taxol 10/17-11/23/22. She required dose reduction with last cycle due to radiation dermatitis, hoarseness, dysphagia, and odynophagia.  -given PDL1+, I recommend immunotherapy with Nivolumab. I reviewed the indications and side effects with  them today. She will receive it every 2 weeks for the first two months, then move to every 4 weeks if she tolerates well. Will plan to give this for a total of a year if NED. -I also briefly discussed the option of a port. She notes her veins are small and they "roll," but she did not have issues with carbo/taxol infusions. Will hold on this for now.  2. Dysphagia, weight loss  -Secondary to #1 -She underwent G-tube placement by Dr. Harlow Asa on 02/22/21 -she is currently unable to swallow, currently taking in nutrition only by feeding tube, 5 times a day. -she is losing weight because of this, and I asked them to increase to 7 feedings a day, if she tolerates. -I will have nutrition reach out to her. -will reach out to GI Dr. Carlean Purl to see if she needs EGD in next few months to see if she has esophageal stricture    3. 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 Trigeminal neuralgia, sciatica, headaches -On lamotrigene and oxcarbazepine  -pain has worsened before and during treatment -f/up Dr. Jaynee Eagles    5. HTN -continue benazepril, furosemide - I reduced benazepril to once daily from 03/09/21, and let her know to hold lasix if she becomes dizzy at home   -monitoring closely    6. Goals of Care/Code Status -Code status was DNR from hospitalization 05/16/20, changed to full code during 02/26/2021 hospitalization for feeding tube placement     PLAN: -lab, f/u, and Nivo in 2, 4, and 6 weeks -nutrition f/u in 2 weeks   No problem-specific Assessment & Plan notes found for this encounter.   SUMMARY  OF ONCOLOGIC HISTORY: Oncology History  Malignant neoplasm of upper third esophagus ([place])  12/21/2020 Imaging   Laryngoscopy Comments:    On fiberoptic laryngoscopy through the  right nostril the nasopharynx was clear.  The base of tongue vallecula and epiglottis were normal.    Piriform sinuses were clear bilaterally and vocal cords were clear with  normal vocal mobility.  No structural abnormalities noted.   01/22/2021 Imaging   DG esophagus IMPRESSION: 1. Luminal narrowing in the high cervical esophagus over a 3 cm segment is highly concerning for esophageal neoplasm. Inflammatory process would be a secondary consideration. Luminal narrowing occurs approximately at the C5-C7 vertebral body level just below the glottis. Patient experienced several episodes of choking related to this luminal narrowing. Recommend expedient upper GI endoscopy for evaluation. 2. Distal thoracic esophagus and GE junction appear normal.   02/04/2021 Procedure   EGD by Dr. Carlean Purl impression- One mass-like severe stenosis was found in the upper third of the esophagus. The stenosis was not traversed.   02/05/2021 Imaging   CT soft tissue neck w contrast IMPRESSION: Mass-like soft tissue thickening and mucosal hyperenhancement of the cervical esophagus and hypopharynx, spanning the C4-T1 levels, likely reflecting an esophageal malignancy. This measures up to 2.4 x 3.4 cm in transaxial dimensions, and 6.3 cm in craniocaudal dimension. Associated severe effacement of the esophageal lumen.   Centrally necrotic right paratracheal lymph node beneath the level of the mass, measuring 1.3 x 0.9 cm, and likely reflecting a site of nodal metastatic disease.   Additional lymph nodes along the posterior and inferior aspect of the right thyroid lobe (along the right aspect of the esophageal mass), which measure subcentimeter but are asymmetrically prominent and highly suspicious for additional sites of nodal metastatic disease.   02/12/2021 Pathology Results   FINAL MICROSCOPIC DIAGNOSIS:  A. ESOPHAGUS, UPPER CERVICAL #1, BIOPSY:  - Squamous cell carcinoma.  B. ESOPHAGUS, UPPER CERVICAL #2, BIOPSY:  -  Squamous cell carcinoma.    02/12/2021 Cancer Staging   Staging form: Esophagus - Squamous Cell Carcinoma, AJCC 8th Edition - Clinical stage from 02/12/2021: Stage Unknown (cTX, cN2, cM0) - Signed by [name], MD on 03/08/2021 Stage prefix: Initial diagnosis    02/18/2021 Initial Diagnosis   Malignant neoplasm of upper third esophagus ([place])   02/23/2021 PET scan   IMPRESSION: Markedly hypermetabolic focal masslike thickening of the cervical esophagus, compatible with primary esophageal malignancy.   Markedly hypermetabolic right upper paratracheal lymph node, compatible with metastatic disease.   Small right upper paratracheal and right level IIA lymph nodes with mild hypermetabolic activity, concerning for additional sites of nodal metastatic disease.   Aortic Atherosclerosis (ICD10-I70.0).   03/09/2021 - 04/13/2021 Chemotherapy   Patient is on Treatment Plan : ESOPHAGUS Carboplatin/PACLitaxel weekly x 6 weeks with XRT       05/21/2021 -  Chemotherapy   Patient is on Treatment Plan : GASTROESOPHAGEAL Nivolumab q14d x 8 cycles / Nivolumab q28d        INTERVAL HISTORY:  Sanoe Hazan is here for a follow up of esophageal cancer. She was last seen by NP Lacie on 04/29/21 with phone visit in the interim. She presents to the clinic accompanied by her husband. Her voice is returning, and her skin is healing. She reports she is unable to swallow anything, so they are doing 5 feedings a day through her tube.   All other systems were reviewed with the patient and are negative.  MEDICAL HISTORY:  Past Medical History:  Diagnosis  Date   Abdominal pain 05/29/2013   s/p rx of cephalo resistant e coli   but last rx NG  now residular ?  bladder sx repeat cx sx rx to ty and uro consult    ADJ DISORDER WITH MIXED ANXIETY & DEPRESSED MOOD 03/03/2010   Qualifier: Diagnosis of  By: [name], [name]    Agent resistant to multiple antibiotics 05/29/2013   e coli   bu NG on fu.      Anemia    Anxiety    ARF (acute renal failure) ([place]) 03/12/2015   Cancer ([place])    Closed head injury 02/01/2011   from syncope and had scalp laceration  neg ct .     Closed head injury 5-6 yrs ago   Colitis 43/15/4008   Complication of anesthesia    migraine several hours after general anesthesia   Depression    Fatty liver    Gall stones 2016   see ct scan neg HIDA    GERD (gastroesophageal reflux disease)    Hearing aid worn    HOH (hard of hearing)    both ears   Hyperlipidemia    Hypertension    echo nl lv function  mild dilitation 2009   Kidney infection    few yrs ago in hospital   Medication side effect 09/02/2010   Poss muscle se of 10 crestor    Migraine    hypnic HA eval by Dr. 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    Pyelonephritis 03/12/2015   Sensation of pain in anesthetized distribution of trigeminal nerve    Syncope 02/01/2011   In shower on vacation  sustained head laceration  8 sutures Had ed visit neg head ct labs and x ray    Trigeminal neuralgia pain     SURGICAL HISTORY: Past Surgical History:  Procedure Laterality Date   ABDOMINAL HYSTERECTOMY  2002   tubal   BACK SURGERY     2 times, for sciatic nerve pain   CARDIAC CATHETERIZATION  2000   chest pains neg   CHOLECYSTECTOMY N/A 02/21/2017   Procedure: LAPAROSCOPIC CHOLECYSTECTOMY WITH INTRAOPERATIVE CHOLANGIOGRAM;  Surgeon: Armandina Gemma, MD;  Location: [facility];  Service: General;  Laterality: N/A;   COLONOSCOPY     multiple   CRANIOTOMY  12/09/2011   nerve decompression right trigeminal    DIRECT LARYNGOSCOPY N/A 02/12/2021   Procedure: DIRECT LARYNGOSCOPY AND BIOPSY POSSIBLE FROZEN;  Surgeon: [name], MD;  Location: [facility];  Service: ENT;  Laterality: N/A;   DOPPLER ECHOCARDIOGRAPHY  2009   nl lv function mild lv dilitation   EYE SURGERY Bilateral    ioc for catatracts   GASTROSTOMY N/A 02/26/2021   Procedure: OPEN GASTROSTOMY  TUBE PLACEMENT;  Surgeon: Armandina Gemma, MD;  Location: [facility];  Service: General;  Laterality: N/A;   IR GASTROSTOMY TUBE MOD SED  02/22/2021   laparoscopic gallbladder surgery  02/16/2017   Fax from [facility] Bilateral 2002   rt shoulder surgery      I have reviewed the social history and family history with the patient and they are unchanged from previous note.  ALLERGIES:  is allergic to hydrocodone and sulfamethoxazole-trimethoprim.  MEDICATIONS:  Current Outpatient Medications  Medication Sig Dispense Refill   alendronate (FOSAMAX) 70 MG tablet TAKE 1 TABLET BY MOUTH 1 TIME A WEEK WITH FULL GLASS OF WATER AND ON AN  EMPTY STOMACH AS DIRECTED 4 tablet 2   butorphanol (STADOL) 10 MG/ML nasal spray Place 1 spray into the nose every 4 (four) hours as needed for headache (trigeminal neuralgia.). 2.5 mL 0   Calcium Carb-Cholecalciferol (CALCIUM 600+D) 600-800 MG-UNIT TABS Take 1 tablet by mouth 2 (two) times daily.     carvedilol (COREG) 25 MG tablet TAKE 1 TABLET(25 MG) BY MOUTH TWICE DAILY WITH A MEAL 60 tablet 3   conjugated estrogens (PREMARIN) vaginal cream Place 1 applicator vaginally 2 (two) times a week.     fluticasone (FLONASE) 50 MCG/ACT nasal spray Place 2 sprays into both nostrils as needed for allergies or rhinitis. 16 g 5   furosemide (LASIX) 20 MG tablet TAKE 1 TABLET(20 MG) BY MOUTH DAILY 90 tablet 0   furosemide (LASIX) 40 MG tablet Take 1 tablet (40 mg total) by mouth daily. 30 tablet 3   lamoTRIgine (LAMICTAL) 100 MG tablet Take by mouth.     lamoTRIgine 100 MG TBDP Dissolve 1 tablet (100 mg) under tongue twice daily along with 50 mg for total dose of 150 mg twice daily. 60 tablet 3   Lisinopril 1 MG/ML SOLN Take 20 mg  daily Per g tube 600 mL 2   Multiple Vitamin (MULTIVITAMIN ADULT PO) Take 1 tablet by mouth daily.     ondansetron (ZOFRAN-ODT) 4 MG disintegrating tablet Take 1-2 tablets (4-8 mg total) by mouth every 8 (eight) hours as needed  for nausea. 60 tablet 3   OXcarbazepine (TRILEPTAL) 300 MG/5ML suspension Take 2.5 mLs (150 mg total) by mouth 2 (two) times daily. 250 mL 3   Polyethyl Glycol-Propyl Glycol (SYSTANE) 0.4-0.3 % GEL ophthalmic gel Place 1 application into both eyes 2 (two) times daily as needed (dry eyes).     sucralfate (CARAFATE) 1 g tablet Take 1 tablet (1 g total) by mouth 4 (four) times daily. Dissolve each tablet in 15 cc water before use. 120 tablet 2   No current facility-administered medications for this visit.    PHYSICAL EXAMINATION: ECOG PERFORMANCE STATUS: 2 - Symptomatic, <50% confined to bed  Vitals:   05/21/21 1351  BP: 114/62  Pulse: 80  Resp: 18  Temp: (!) 97.4 F (36.3 C)  SpO2: 98%   Wt Readings from Last 3 Encounters:  05/21/21 125 lb 9.6 oz (57 kg)  04/29/21 131 lb (59.4 kg)  04/13/21 128 lb (58.1 kg)     GENERAL:alert, no distress and comfortable SKIN: skin color normal, no rashes or significant lesions EYES: normal, Conjunctiva are pink and non-injected, sclera clear  NEURO: alert & oriented x 3 with fluent speech  LABORATORY DATA:  I have reviewed the data as listed CBC Latest Ref Rng & Units 05/21/2021 04/29/2021 04/13/2021  WBC 4.0 - 10.5 K/uL 6.8 2.4(L) 2.4(L)  Hemoglobin 12.0 - 15.0 g/dL 13.2 10.3(L) 10.9(L)  Hematocrit 36.0 - 46.0 % 39.3 32.2(L) 33.2(L)  Platelets 150 - 400 K/uL 227 220 96(L)     CMP Latest Ref Rng & Units 05/21/2021 04/29/2021 04/13/2021  Glucose 70 - 99 mg/dL 107(H) 193(H) 144(H)  BUN 8 - 23 mg/dL 28(H) 16 23  Creatinine 0.44 - 1.00 mg/dL 0.74 0.70 0.67  Sodium 135 - 145 mmol/L 137 136 137  Potassium 3.5 - 5.1 mmol/L 4.3 4.7 4.4  Chloride 98 - 111 mmol/L 99 99 99  CO2 22 - 32 mmol/L 32 29 30  Calcium 8.9 - 10.3 mg/dL 9.9 8.8(L) 9.3  Total Protein 6.5 - 8.1 g/dL 6.5 5.5(L) 5.9(L)  Total Bilirubin 0.3 - 1.2 mg/dL 0.6 0.6 0.5  Alkaline Phos 38 - 126 U/L 78 76 72  AST 15 - 41 U/L _0 ALT 0 - 44 U/L _1 RADIOGRAPHIC  STUDIES: I have personally reviewed the radiological images as listed and agreed with the findings in the report. No results found.    No orders of the defined types were placed in this encounter.  All questions were answered. The patient knows to call the clinic with any problems, questions or concerns. No barriers to learning was detected. The total time spent in the appointment was 30 minutes.     [name], MD 05/21/2021   I, Wilburn Mylar, am acting as scribe for [name], MD.   I have reviewed the above documentation for accuracy and completeness, and I agree with the above.

## 2021-05-21 NOTE — Patient Instructions (Signed)
[facility] ONCOLOGY  Discharge Instructions: Thank you for choosing [facility] to provide your oncology and hematology care.   If you have a lab appointment with the [facility], please go directly to the Person and check in at the registration area.   Wear comfortable clothing and clothing appropriate for easy access to any Portacath or PICC line.   We strive to give you quality time with your provider. You may need to reschedule your appointment if you arrive late (15 or more minutes).  Arriving late affects you and other patients whose appointments are after yours.  Also, if you miss three or more appointments without notifying the office, you may be dismissed from the clinic at the providers discretion.      For prescription refill requests, have your pharmacy contact our office and allow 72 hours for refills to be completed.    Today you received the following chemotherapy and/or immunotherapy agents Nivolumab      To help prevent nausea and vomiting after your treatment, we encourage you to take your nausea medication as directed.  BELOW ARE SYMPTOMS THAT SHOULD BE REPORTED IMMEDIATELY: *FEVER GREATER THAN 100.4 F (38 C) OR HIGHER *CHILLS OR SWEATING *NAUSEA AND VOMITING THAT IS NOT CONTROLLED WITH YOUR NAUSEA MEDICATION *UNUSUAL SHORTNESS OF BREATH *UNUSUAL BRUISING OR BLEEDING *URINARY PROBLEMS (pain or burning when urinating, or frequent urination) *BOWEL PROBLEMS (unusual diarrhea, constipation, pain near the anus) TENDERNESS IN MOUTH AND THROAT WITH OR WITHOUT PRESENCE OF ULCERS (sore throat, sores in mouth, or a toothache) UNUSUAL RASH, SWELLING OR PAIN  UNUSUAL VAGINAL DISCHARGE OR ITCHING   Items with * indicate a potential emergency and should be followed up as soon as possible or go to the Emergency Department if any problems should occur.  Please show the CHEMOTHERAPY ALERT CARD or IMMUNOTHERAPY ALERT CARD at check-in to  the Emergency Department and triage nurse.  Should you have questions after your visit or need to cancel or reschedule your appointment, please contact [facility]  Dept: [phone]  and follow the prompts.  Office hours are 8:00 a.m. to 4:30 p.m. Monday - Friday. Please note that voicemails left after 4:00 p.m. may not be returned until the following business day.  We are closed weekends and major holidays. You have access to a nurse at all times for urgent questions. Please call the main number to the clinic Dept: [phone] and follow the prompts.   For any non-urgent questions, you may also contact your provider using MyChart. We now offer e-Visits for anyone 4 and older to request care online for non-urgent symptoms. For details visit [email or web address].   Also download the MyChart app! Go to the app store, search "MyChart", open the app, select [facility], and log in with your MyChart username and password.  Due to Covid, a mask is required upon entering the hospital/clinic. If you do not have a mask, one will be given to you upon arrival. For doctor visits, patients may have 1 support person aged 69 or older with them. For treatment visits, patients cannot have anyone with them due to current Covid guidelines and our immunocompromised population.

## 2021-05-22 LAB — T4: T4, Total: 6.7 ug/dL (ref 4.5–12.0)

## 2021-05-25 ENCOUNTER — Telehealth: Payer: Self-pay | Admitting: *Deleted

## 2021-05-25 NOTE — Telephone Encounter (Signed)
Called & spoke with pt.  She reports no problems from treatment.  She is using feeding tube & drinking small amts of fluids orally.  She knows how to reach [place] for concerns.  Future appts not scheduled.  Message to scheduler to review & call pt.

## 2021-05-25 NOTE — Telephone Encounter (Signed)
-----   Message from Belva Chimes, RN sent at 05/21/2021  4:35 PM EST ----- Regarding: 1st time Nivolumab- Dr. [name]- pt tolerated well

## 2021-05-26 ENCOUNTER — Telehealth: Payer: Self-pay | Admitting: Hematology

## 2021-05-26 NOTE — Telephone Encounter (Signed)
Scheduled follow-up appointments per 12/30 los. Patient is aware. Mailed calendar.

## 2021-05-29 NOTE — Progress Notes (Signed)
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 She developed fatigue and anticipated skin changes in the treatment field. She did develop some hoarseness and episodes of gagging continued during therapy. She used her PEG tube for enteral nutritional support.      Impression/Plan: 1. 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 The call I made did not go through and did not connect to voicemail, but we would be happy to continue to follow her as needed, but she will also continue to follow up with Dr. [name] in medical oncology.     Carola Rhine, PAC

## 2021-05-31 ENCOUNTER — Ambulatory Visit
Admission: RE | Admit: 2021-05-31 | Discharge: 2021-05-31 | Disposition: A | Discharge status: Home / Self Care | Payer: Medicare Other | Source: Ambulatory Visit | Attending: Radiation Oncology | Admitting: Radiation Oncology

## 2021-05-31 DIAGNOSIS — C153 Malignant neoplasm of upper third of esophagus: Secondary | ICD-10-CM

## 2021-06-01 ENCOUNTER — Inpatient Hospital Stay: Payer: Medicare Other | Attending: Nurse Practitioner | Admitting: Nutrition

## 2021-06-01 ENCOUNTER — Telehealth: Payer: Self-pay | Admitting: Nutrition

## 2021-06-01 DIAGNOSIS — R058 Other specified cough: Secondary | ICD-10-CM | POA: Insufficient documentation

## 2021-06-01 DIAGNOSIS — Z79899 Other long term (current) drug therapy: Secondary | ICD-10-CM | POA: Insufficient documentation

## 2021-06-01 DIAGNOSIS — I10 Essential (primary) hypertension: Secondary | ICD-10-CM | POA: Insufficient documentation

## 2021-06-01 DIAGNOSIS — R634 Abnormal weight loss: Secondary | ICD-10-CM | POA: Insufficient documentation

## 2021-06-01 DIAGNOSIS — G500 Trigeminal neuralgia: Secondary | ICD-10-CM | POA: Insufficient documentation

## 2021-06-01 DIAGNOSIS — F32A Depression, unspecified: Secondary | ICD-10-CM | POA: Insufficient documentation

## 2021-06-01 DIAGNOSIS — C153 Malignant neoplasm of upper third of esophagus: Secondary | ICD-10-CM | POA: Insufficient documentation

## 2021-06-01 DIAGNOSIS — R1310 Dysphagia, unspecified: Secondary | ICD-10-CM | POA: Insufficient documentation

## 2021-06-01 DIAGNOSIS — Z5112 Encounter for antineoplastic immunotherapy: Secondary | ICD-10-CM | POA: Insufficient documentation

## 2021-06-01 DIAGNOSIS — Z90710 Acquired absence of both cervix and uterus: Secondary | ICD-10-CM | POA: Insufficient documentation

## 2021-06-01 MED ORDER — NUTREN 1.5 EN LIQD: 6.0000 | Freq: Every day | ENTERAL | 6 refills | Status: DC

## 2021-06-01 NOTE — Telephone Encounter (Signed)
Telephone follow-up completed with patient's husband.    Patient is status post chemotherapy and radiation for esophageal cancer. Weight last documented on December 30 of 125 pounds.  This is decreased from 131 pounds December 8. Labs noted: Glucose 107, albumin 2.8 and BUN 28. Concerns voiced regarding weight loss.   Patient is not having any difficulty with nausea, vomiting, constipation, or diarrhea.   Reports feeding tube is being cleaned on a regular basis and there are no issues. They are in need of an order of formula and supplies.  5 cartons Osmolite 1.5 provides 1775 cal, 74.5 g protein, 1805 mL free water.  Revised estimated nutrition needs: 1989-2200 cal, 85-100 g protein, 2.2 L fluid.  Nutrition diagnosis: Inadequate oral intake continues.  Intervention: Will increase Osmolite 1.5-6 cartons daily with 60 mL free water before and 120 mL free water after each bolus feeding. This should promote weight stabilization/weight gain. Continue fluids by mouth as tolerated.  We will contact [employer] with new orders. Husband states understanding.  6 cartons Nutren 1.5 (substituted for Osmolite 1.5 due to shortage) provides 2250 kcal, 102 gm pro, and 1146 mL free water (2046 mL free water total with water flushes)  Monitoring, evaluation, goals: We will continue to monitor tube feedings and weight and make adjustments as needed.  Next visit: Telephone call in 2 to 4 weeks.  **Disclaimer: This note was dictated with voice recognition software. Similar sounding words can inadvertently be transcribed and this note may contain transcription errors which may not have been corrected upon publication of note.**

## 2021-06-03 ENCOUNTER — Encounter: Payer: Self-pay | Admitting: Dietician

## 2021-06-03 NOTE — Progress Notes (Signed)
Provided one complimentary case of Osmolite 1.5

## 2021-06-04 ENCOUNTER — Inpatient Hospital Stay: Payer: Medicare Other | Admitting: Hematology

## 2021-06-04 ENCOUNTER — Ambulatory Visit (HOSPITAL_COMMUNITY)
Admission: RE | Admit: 2021-06-04 | Discharge: 2021-06-04 | Disposition: A | Discharge status: Home / Self Care | Payer: Medicare Other | Source: Ambulatory Visit | Attending: Hematology | Admitting: Hematology

## 2021-06-04 ENCOUNTER — Other Ambulatory Visit: Payer: Self-pay

## 2021-06-04 ENCOUNTER — Inpatient Hospital Stay: Payer: Medicare Other

## 2021-06-04 VITALS — Temp 97.9°F

## 2021-06-04 VITALS — BP 126/75 | HR 73 | Resp 17 | Wt 125.6 lb

## 2021-06-04 DIAGNOSIS — C153 Malignant neoplasm of upper third of esophagus: Secondary | ICD-10-CM

## 2021-06-04 DIAGNOSIS — F32A Depression, unspecified: Secondary | ICD-10-CM | POA: Diagnosis not present

## 2021-06-04 DIAGNOSIS — R1310 Dysphagia, unspecified: Secondary | ICD-10-CM | POA: Diagnosis not present

## 2021-06-04 DIAGNOSIS — Z5112 Encounter for antineoplastic immunotherapy: Secondary | ICD-10-CM | POA: Diagnosis not present

## 2021-06-04 DIAGNOSIS — R059 Cough, unspecified: Secondary | ICD-10-CM | POA: Diagnosis not present

## 2021-06-04 DIAGNOSIS — Z90710 Acquired absence of both cervix and uterus: Secondary | ICD-10-CM | POA: Diagnosis not present

## 2021-06-04 DIAGNOSIS — R634 Abnormal weight loss: Secondary | ICD-10-CM | POA: Diagnosis not present

## 2021-06-04 DIAGNOSIS — I10 Essential (primary) hypertension: Secondary | ICD-10-CM | POA: Diagnosis not present

## 2021-06-04 DIAGNOSIS — G500 Trigeminal neuralgia: Secondary | ICD-10-CM | POA: Diagnosis not present

## 2021-06-04 DIAGNOSIS — Z79899 Other long term (current) drug therapy: Secondary | ICD-10-CM | POA: Diagnosis not present

## 2021-06-04 DIAGNOSIS — R058 Other specified cough: Secondary | ICD-10-CM | POA: Diagnosis not present

## 2021-06-04 LAB — CBC WITH DIFFERENTIAL (CANCER CENTER ONLY)
Abs Immature Granulocytes: 0.01 10*3/uL (ref 0.00–0.07)
Basophils Absolute: 0 10*3/uL (ref 0.0–0.1)
Basophils Relative: 1 %
Eosinophils Absolute: 0.1 10*3/uL (ref 0.0–0.5)
Eosinophils Relative: 2 %
HCT: 40.1 % (ref 36.0–46.0)
Hemoglobin: 13.2 g/dL (ref 12.0–15.0)
Immature Granulocytes: 0 %
Lymphocytes Relative: 11 %
Lymphs Abs: 0.5 10*3/uL — ABNORMAL LOW (ref 0.7–4.0)
MCH: 30.8 pg (ref 26.0–34.0)
MCHC: 32.9 g/dL (ref 30.0–36.0)
MCV: 93.7 fL (ref 80.0–100.0)
Monocytes Absolute: 0.5 10*3/uL (ref 0.1–1.0)
Monocytes Relative: 10 %
Neutro Abs: 3.7 10*3/uL (ref 1.7–7.7)
Neutrophils Relative %: 76 %
Platelet Count: 193 10*3/uL (ref 150–400)
RBC: 4.28 MIL/uL (ref 3.87–5.11)
RDW: 15.7 % — ABNORMAL HIGH (ref 11.5–15.5)
WBC Count: 4.8 10*3/uL (ref 4.0–10.5)
nRBC: 0 % (ref 0.0–0.2)

## 2021-06-04 LAB — CMP (CANCER CENTER ONLY)
ALT: 19 U/L (ref 0–44)
AST: 22 U/L (ref 15–41)
Albumin: 3.5 g/dL (ref 3.5–5.0)
Alkaline Phosphatase: 80 U/L (ref 38–126)
Anion gap: 5 (ref 5–15)
BUN: 22 mg/dL (ref 8–23)
CO2: 32 mmol/L (ref 22–32)
Calcium: 9.5 mg/dL (ref 8.9–10.3)
Chloride: 100 mmol/L (ref 98–111)
Creatinine: 0.7 mg/dL (ref 0.44–1.00)
GFR, Estimated: >60 mL/min (ref >60)
Glucose, Bld: 176 mg/dL — ABNORMAL HIGH (ref 70–99)
Potassium: 4.3 mmol/L (ref 3.5–5.1)
Sodium: 137 mmol/L (ref 135–145)
Total Bilirubin: 0.6 mg/dL (ref 0.3–1.2)
Total Protein: 6.5 g/dL (ref 6.5–8.1)

## 2021-06-04 LAB — TSH: TSH: 0.646 u[IU]/mL (ref 0.308–3.960)

## 2021-06-04 MED ORDER — SODIUM CHLORIDE 0.9 % IV SOLN: Freq: Once | INTRAVENOUS | Status: AC

## 2021-06-04 MED ORDER — SODIUM CHLORIDE 0.9 % IV SOLN
240.0000 mg | Freq: Once | INTRAVENOUS | Status: AC
  Administered 2021-06-04: 240 mg via INTRAVENOUS
  Filled 2021-06-04: qty 24

## 2021-06-04 NOTE — Progress Notes (Signed)
[facility]   Telephone:(336) [phone] Fax:(336) [phone]   Clinic Follow up Note   Patient Care Team: [name], [name], MD as PCP - General [name], MD as PCP - Cardiology (Cardiology) Cindie Crumbly (Neurosurgery) Irine Seal, MD as Attending Physician (Urology) Erline Levine, MD as Consulting Physician (Neurosurgery) [name], MD as Consulting Physician (Neurology) Viona Gilmore, [facility] as Pharmacist (Pharmacist) [name], MD as Consulting Physician (Gastroenterology) Carol Ada, MD as Consulting Physician (Gastroenterology) [name], MD (Inactive) as Consulting Physician (Otolaryngology) [name], MD as Consulting Physician (Hematology) Kyung Rudd, MD as Consulting Physician (Radiation Oncology) Kyung Rudd, MD as Consulting Physician (Radiation Oncology)  Date of Service:  06/04/2021  CHIEF COMPLAINT: f/u of esophageal cancer  CURRENT THERAPY:  Nivolumab q2weeks, started 05/21/21  ASSESSMENT & PLAN:  [name] is a 81 y.o. female with   1.Squamous cell carcinoma of cervical esophagus, cTxN2M0 -presented with dysphagia and weight loss. EGD 9/15 and CT neck 02/05/21 showed 6 cm mass in the cervical/upper esophagus with possible local adenopathy. Path confirmed squamous cell carcinoma of the esophagus, PDL1 testing 95% positive -She had feeding tube placed by Dr. Harlow Asa 02/22/21  -PET scan 02/23/21 shows hypermetabolic esophageal mass and hypermetabolic paratracheal nodes, negative to distant metastasis. This is local disease  -due to the location in the cervical esophagus, Dr. Kipp Brood feels she is not a surgical candidate.  -she received concurrent chemoRT with carbo/taxol 10/17-11/23/22. She required dose reduction with last cycle due to radiation dermatitis, hoarseness, dysphagia, and odynophagia.  -she is a poor candidate for consolidation chemo FOLFOX due to her advanced age and low PS  -given PDL1+,  she began immunotherapy with Nivolumab on 05/21/21. She will receive it every 2 weeks for the first two months, then move to every 4 weeks if she tolerates well. Will plan to give this for a total of a year if NED. -plan to repeat PET scan in early March   2. Productive Cough -developed recently -due to issues swallowing, she is spitting up the mucus she produces. The mucus is white, not concerning for infection. -lungs clear on physical exam -I will order chest x-ray today to rule out infection.   3. Dysphagia, weight loss  -Secondary to #1 -She underwent G-tube placement by Dr. Harlow Asa on 02/22/21 -she is currently unable to swallow, currently taking in nutrition only by feeding tube, 5 times a day. Nutritionist Pamala Hurry recommended increasing to 6 times a day, but they report difficulty obtaining Osmolite. She recommended Nutren. -will reach out to GI Dr. Carlean Purl to see if she needs EGD in next few months to see if she has esophageal stricture or persistent residual cancer    4. 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 Trigeminal neuralgia, sciatica, headaches -On lamotrigene and oxcarbazepine  -pain has worsened before and during treatment -f/up Dr. Jaynee Eagles    6. HTN -continue benazepril, furosemide - I reduced benazepril to once daily from 03/09/21, and let her know to hold lasix if she becomes dizzy at home   -monitoring closely    7. Goals of Care/Code Status -Code status was DNR from hospitalization 05/16/20, changed to full code during 02/26/2021 hospitalization for feeding tube placement       PLAN: -proceed with Nivo today and every 2 weeks -  chest x-ray today -we will reach out to Dr. Carlean Purl regarding EGD -lab, f/u, and Nivo as scheduled in 2 and 4  weeks   No problem-specific Assessment & Plan notes found for this encounter.   SUMMARY OF ONCOLOGIC HISTORY: Oncology History  Malignant neoplasm of upper third esophagus ([place])  12/21/2020 Imaging   Laryngoscopy Comments:    On fiberoptic laryngoscopy through the right nostril the nasopharynx was clear.  The base of tongue vallecula and epiglottis were normal.    Piriform sinuses were clear bilaterally and vocal cords were clear with  normal vocal mobility.  No structural abnormalities noted.   01/22/2021 Imaging   DG esophagus IMPRESSION: 1. Luminal narrowing in the high cervical esophagus over a 3 cm segment is highly concerning for esophageal neoplasm. Inflammatory process would be a secondary consideration. Luminal narrowing occurs approximately at the C5-C7 vertebral body level just below the glottis. Patient experienced several episodes of choking related to this luminal narrowing. Recommend expedient upper GI endoscopy for evaluation. 2. Distal thoracic esophagus and GE junction appear normal.   02/04/2021 Procedure   EGD by Dr. Carlean Purl impression- One mass-like severe stenosis was found in the upper third of the esophagus. The stenosis was not traversed.   02/05/2021 Imaging   CT soft tissue neck w contrast IMPRESSION: Mass-like soft tissue thickening and mucosal hyperenhancement of the cervical esophagus and hypopharynx, spanning the C4-T1 levels, likely reflecting an esophageal malignancy. This measures up to 2.4 x 3.4 cm in transaxial dimensions, and 6.3 cm in craniocaudal dimension. Associated severe effacement of the esophageal lumen.   Centrally necrotic right paratracheal lymph node beneath the level of the mass, measuring 1.3 x 0.9 cm, and likely reflecting a site of nodal metastatic disease.   Additional lymph nodes along the posterior and inferior aspect of the right thyroid lobe (along the right aspect of the esophageal mass), which measure subcentimeter but are  asymmetrically prominent and highly suspicious for additional sites of nodal metastatic disease.   02/12/2021 Pathology Results   FINAL MICROSCOPIC DIAGNOSIS:  A. ESOPHAGUS, UPPER CERVICAL #1, BIOPSY:  - Squamous cell carcinoma.  B. ESOPHAGUS, UPPER CERVICAL #2, BIOPSY:  - Squamous cell carcinoma.    02/12/2021 Cancer Staging   Staging form: Esophagus - Squamous Cell Carcinoma, AJCC 8th Edition - Clinical stage from 02/12/2021: Stage Unknown (cTX, cN2, cM0) - Signed by [name], MD on 03/08/2021 Stage prefix: Initial diagnosis    02/18/2021 Initial Diagnosis   Malignant neoplasm of upper third esophagus ([place])   02/23/2021 PET scan   IMPRESSION: Markedly hypermetabolic focal masslike thickening of the cervical esophagus, compatible with primary esophageal malignancy.   Markedly hypermetabolic right upper paratracheal lymph node, compatible with metastatic disease.   Small right upper paratracheal and right level IIA lymph nodes with mild hypermetabolic activity, concerning for additional sites of nodal metastatic disease.   Aortic Atherosclerosis (ICD10-I70.0).   03/09/2021 - 04/13/2021 Chemotherapy   Patient is on Treatment Plan : ESOPHAGUS Carboplatin/PACLitaxel weekly x 6 weeks with XRT       05/21/2021 -  Chemotherapy   Patient is on Treatment Plan : GASTROESOPHAGEAL Nivolumab q14d x 8 cycles / Nivolumab q28d        INTERVAL HISTORY:  Ashantia Amaral is here for a follow up of esophageal cancer. She was last seen by me on 05/21/21. She presents to the clinic accompanied by her husband. She has a productive cough. Due to her issues swallowing, she is having to spit out the mucus she  is producing.   All other systems were reviewed with the patient and are negative.  MEDICAL HISTORY:  Past Medical History:  Diagnosis Date   Abdominal pain 05/29/2013   s/p rx of cephalo resistant e coli   but last rx NG  now residular ?  bladder sx repeat cx sx rx to ty and uro  consult    ADJ DISORDER WITH MIXED ANXIETY & DEPRESSED MOOD 03/03/2010   Qualifier: Diagnosis of  By: [name], [name]    Agent resistant to multiple antibiotics 05/29/2013   e coli   bu NG on fu.     Anemia    Anxiety    ARF (acute renal failure) ([place]) 03/12/2015   Cancer ([place])    Closed head injury 02/01/2011   from syncope and had scalp laceration  neg ct .     Closed head injury 5-6 yrs ago   Colitis 49/44/9675   Complication of anesthesia    migraine several hours after general anesthesia   Depression    Fatty liver    Gall stones 2016   see ct scan neg HIDA    GERD (gastroesophageal reflux disease)    Hearing aid worn    HOH (hard of hearing)    both ears   Hyperlipidemia    Hypertension    echo nl lv function  mild dilitation 2009   Kidney infection    few yrs ago in hospital   Medication side effect 09/02/2010   Poss muscle se of 10 crestor    Migraine    hypnic HA eval by Dr. 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    Pyelonephritis 03/12/2015   Sensation of pain in anesthetized distribution of trigeminal nerve    Syncope 02/01/2011   In shower on vacation  sustained head laceration  8 sutures Had ed visit neg head ct labs and x ray    Trigeminal neuralgia pain     SURGICAL HISTORY: Past Surgical History:  Procedure Laterality Date   ABDOMINAL HYSTERECTOMY  2002   tubal   BACK SURGERY     2 times, for sciatic nerve pain   CARDIAC CATHETERIZATION  2000   chest pains neg   CHOLECYSTECTOMY N/A 02/21/2017   Procedure: LAPAROSCOPIC CHOLECYSTECTOMY WITH INTRAOPERATIVE CHOLANGIOGRAM;  Surgeon: Armandina Gemma, MD;  Location: [facility];  Service: General;  Laterality: N/A;   COLONOSCOPY     multiple   CRANIOTOMY  12/09/2011   nerve decompression right trigeminal    DIRECT LARYNGOSCOPY N/A 02/12/2021   Procedure: DIRECT LARYNGOSCOPY AND BIOPSY POSSIBLE FROZEN;  Surgeon: [name], MD;  Location: [facility];   Service: ENT;  Laterality: N/A;   DOPPLER ECHOCARDIOGRAPHY  2009   nl lv function mild lv dilitation   EYE SURGERY Bilateral    ioc for catatracts   GASTROSTOMY N/A 02/26/2021   Procedure: OPEN GASTROSTOMY TUBE PLACEMENT;  Surgeon: Armandina Gemma, MD;  Location: [facility];  Service: General;  Laterality: N/A;   IR GASTROSTOMY TUBE MOD SED  02/22/2021   laparoscopic gallbladder surgery  02/16/2017   Fax from [facility] Bilateral 2002   rt shoulder surgery      I have reviewed the social history and family history with the patient and they are unchanged from previous note.  ALLERGIES:  is allergic to hydrocodone and sulfamethoxazole-trimethoprim.  MEDICATIONS:  Current Outpatient Medications  Medication Sig Dispense Refill  alendronate (FOSAMAX) 70 MG tablet TAKE 1 TABLET BY MOUTH 1 TIME A WEEK WITH FULL GLASS OF WATER AND ON AN EMPTY STOMACH AS DIRECTED 4 tablet 2   butorphanol (STADOL) 10 MG/ML nasal spray Place 1 spray into the nose every 4 (four) hours as needed for headache (trigeminal neuralgia.). 2.5 mL 0   Calcium Carb-Cholecalciferol (CALCIUM 600+D) 600-800 MG-UNIT TABS Take 1 tablet by mouth 2 (two) times daily.     carvedilol (COREG) 25 MG tablet TAKE 1 TABLET(25 MG) BY MOUTH TWICE DAILY WITH A MEAL 60 tablet 3   conjugated estrogens (PREMARIN) vaginal cream Place 1 applicator vaginally 2 (two) times a week.     fluticasone (FLONASE) 50 MCG/ACT nasal spray Place 2 sprays into both nostrils as needed for allergies or rhinitis. 16 g 5   furosemide (LASIX) 20 MG tablet TAKE 1 TABLET(20 MG) BY MOUTH DAILY 90 tablet 0   furosemide (LASIX) 40 MG tablet Take 1 tablet (40 mg total) by mouth daily. 30 tablet 3   lamoTRIgine (LAMICTAL) 100 MG tablet Take by mouth.     lamoTRIgine 100 MG TBDP Dissolve 1 tablet (100 mg) under tongue twice daily along with 50 mg for total dose of 150 mg twice daily. 60 tablet 3   Lisinopril 1 MG/ML SOLN Take 20 mg  daily Per g tube  600 mL 2   Multiple Vitamin (MULTIVITAMIN ADULT PO) Take 1 tablet by mouth daily.     Nutritional Supplements (NUTREN 1.5) LIQD 6 Cans by Enteral route 5 (five) times daily. 1500 mL 6   ondansetron (ZOFRAN-ODT) 4 MG disintegrating tablet Take 1-2 tablets (4-8 mg total) by mouth every 8 (eight) hours as needed for nausea. 60 tablet 3   OXcarbazepine (TRILEPTAL) 300 MG/5ML suspension Take 2.5 mLs (150 mg total) by mouth 2 (two) times daily. 250 mL 3   Polyethyl Glycol-Propyl Glycol (SYSTANE) 0.4-0.3 % GEL ophthalmic gel Place 1 application into both eyes 2 (two) times daily as needed (dry eyes).     sucralfate (CARAFATE) 1 g tablet Take 1 tablet (1 g total) by mouth 4 (four) times daily. Dissolve each tablet in 15 cc water before use. 120 tablet 2   No current facility-administered medications for this visit.    PHYSICAL EXAMINATION: ECOG PERFORMANCE STATUS: 2 - Symptomatic, <50% confined to bed  Vitals:   06/04/21 1115  BP: 126/75  Pulse: 73  Resp: 17  SpO2: 97%   Wt Readings from Last 3 Encounters:  06/04/21 125 lb 9.6 oz (57 kg)  05/21/21 125 lb 9.6 oz (57 kg)  04/29/21 131 lb (59.4 kg)     GENERAL:alert, no distress and comfortable SKIN: skin color normal, no rashes or significant lesions EYES: normal, Conjunctiva are pink and non-injected, sclera clear  LUNGS: clear to auscultation and percussion with normal breathing effort HEART: regular rate & rhythm and no murmurs and no lower extremity edema NEURO: alert & oriented x 3 with fluent speech  LABORATORY DATA:  I have reviewed the data as listed CBC Latest Ref Rng & Units 06/04/2021 05/21/2021 04/29/2021  WBC 4.0 - 10.5 K/uL 4.8 6.8 2.4(L)  Hemoglobin 12.0 - 15.0 g/dL 13.2 13.2 10.3(L)  Hematocrit 36.0 - 46.0 % 40.1 39.3 32.2(L)  Platelets 150 - 400 K/uL 193 227 220     CMP Latest Ref Rng & Units 05/21/2021 04/29/2021 04/13/2021  Glucose 70 - 99 mg/dL 107(H) 193(H) 144(H)  BUN 8 - 23 mg/dL 28(H) 16 23  Creatinine 0.44 -  1.00  mg/dL 0.74 0.70 0.67  Sodium 135 - 145 mmol/L 137 136 137  Potassium 3.5 - 5.1 mmol/L 4.3 4.7 4.4  Chloride 98 - 111 mmol/L 99 99 99  CO2 22 - 32 mmol/L 32 29 30  Calcium 8.9 - 10.3 mg/dL 9.9 8.8(L) 9.3  Total Protein 6.5 - 8.1 g/dL 6.5 5.5(L) 5.9(L)  Total Bilirubin 0.3 - 1.2 mg/dL 0.6 0.6 0.5  Alkaline Phos 38 - 126 U/L 78 76 72  AST 15 - 41 U/L 21 17 15   ALT 0 - 44 U/L 19 18 14       RADIOGRAPHIC STUDIES: I have personally reviewed the radiological images as listed and agreed with the findings in the report. No results found.    No orders of the defined types were placed in this encounter.  All questions were answered. The patient knows to call the clinic with any problems, questions or concerns. No barriers to learning was detected. The total time spent in the appointment was 30 minutes.     [name], MD 06/04/2021   I, Wilburn Mylar, am acting as scribe for [name], MD.   I have reviewed the above documentation for accuracy and completeness, and I agree with the above.

## 2021-06-04 NOTE — Patient Instructions (Signed)
[facility] ONCOLOGY  Discharge Instructions: Thank you for choosing [facility] to provide your oncology and hematology care.   If you have a lab appointment with the [facility], please go directly to the [facility] and check in at the registration area.   Wear comfortable clothing and clothing appropriate for easy access to any Portacath or PICC line.   We strive to give you quality time with your provider. You may need to reschedule your appointment if you arrive late (15 or more minutes).  Arriving late affects you and other patients whose appointments are after yours.  Also, if you miss three or more appointments without notifying the office, you may be dismissed from the clinic at the providers discretion.      For prescription refill requests, have your pharmacy contact our office and allow 72 hours for refills to be completed.    Today you received the following chemotherapy and/or immunotherapy agents: Nivolumab.       To help prevent nausea and vomiting after your treatment, we encourage you to take your nausea medication as directed.  BELOW ARE SYMPTOMS THAT SHOULD BE REPORTED IMMEDIATELY: *FEVER GREATER THAN 100.4 F (38 C) OR HIGHER *CHILLS OR SWEATING *NAUSEA AND VOMITING THAT IS NOT CONTROLLED WITH YOUR NAUSEA MEDICATION *UNUSUAL SHORTNESS OF BREATH *UNUSUAL BRUISING OR BLEEDING *URINARY PROBLEMS (pain or burning when urinating, or frequent urination) *BOWEL PROBLEMS (unusual diarrhea, constipation, pain near the anus) TENDERNESS IN MOUTH AND THROAT WITH OR WITHOUT PRESENCE OF ULCERS (sore throat, sores in mouth, or a toothache) UNUSUAL RASH, SWELLING OR PAIN  UNUSUAL VAGINAL DISCHARGE OR ITCHING   Items with * indicate a potential emergency and should be followed up as soon as possible or go to the Emergency Department if any problems should occur.  Please show the CHEMOTHERAPY ALERT CARD or IMMUNOTHERAPY ALERT CARD at check-in  to the Emergency Department and triage nurse.  Should you have questions after your visit or need to cancel or reschedule your appointment, please contact [facility]  Dept: [phone]  and follow the prompts.  Office hours are 8:00 a.m. to 4:30 p.m. Monday - Friday. Please note that voicemails left after 4:00 p.m. may not be returned until the following business day.  We are closed weekends and major holidays. You have access to a nurse at all times for urgent questions. Please call the main number to the clinic Dept: [phone] and follow the prompts.   For any non-urgent questions, you may also contact your provider using MyChart. We now offer e-Visits for anyone 82 and older to request care online for non-urgent symptoms. For details visit [email or web address].   Also download the MyChart app! Go to the app store, search "MyChart", open the app, select [facility], and log in with your MyChart username and password.  Due to Covid, a mask is required upon entering the hospital/clinic. If you do not have a mask, one will be given to you upon arrival. For doctor visits, patients may have 1 support person aged 82 or older with them. For treatment visits, patients cannot have anyone with them due to current Covid guidelines and our immunocompromised population.

## 2021-06-05 ENCOUNTER — Encounter: Payer: Self-pay | Admitting: Hematology

## 2021-06-05 ENCOUNTER — Encounter: Payer: Self-pay | Admitting: Nurse Practitioner

## 2021-06-05 LAB — T4: T4, Total: 6.7 ug/dL (ref 4.5–12.0)

## 2021-06-08 ENCOUNTER — Ambulatory Visit (HOSPITAL_BASED_OUTPATIENT_CLINIC_OR_DEPARTMENT_OTHER): Payer: Medicare Other | Admitting: Cardiology

## 2021-06-08 ENCOUNTER — Other Ambulatory Visit: Payer: Self-pay

## 2021-06-08 ENCOUNTER — Encounter (HOSPITAL_BASED_OUTPATIENT_CLINIC_OR_DEPARTMENT_OTHER): Payer: Self-pay | Admitting: Cardiology

## 2021-06-08 VITALS — BP 110/72 | Ht 66.0 in | Wt 127.0 lb

## 2021-06-08 DIAGNOSIS — I10 Essential (primary) hypertension: Secondary | ICD-10-CM | POA: Diagnosis not present

## 2021-06-08 DIAGNOSIS — R600 Localized edema: Secondary | ICD-10-CM

## 2021-06-08 DIAGNOSIS — Z7189 Other specified counseling: Secondary | ICD-10-CM

## 2021-06-08 DIAGNOSIS — E785 Hyperlipidemia, unspecified: Secondary | ICD-10-CM

## 2021-06-08 NOTE — Progress Notes (Signed)
Cardiology Office Note:    Date:  06/08/2021   ID:  [name], DOB 10-Jul-1940, MRN [id number]  PCP:  [name], MD  Cardiologist:  [name], MD  Referring MD: [name], MD   CC: follow up  History of Present Illness:    [name] is a 81 y.o. female with a hx of esophageal cancer, lower extremity edema, hypertension, hyperlipidemia who is seen for follow up today.  I initially met her 12/04/19 as a new consult at the request of [name], [name], MD for the evaluation and management of labile blood pressure.  Today: Feels poorly overall due to her cancer. Continues to have swelling and discoloration in her feet. She has significant neuropathy. Swelling does not improve with elevation. Had difficulty using compression stockings in the past. Reviewed some alternative options today. Sciatica is gone. Receiving nutrition through G tube. Recent CXR for cough, did not show aspiration pneumonia.  Blood pressure has been consistently well controlled, no lows or highs recently. Tolerating medications well.   Denies chest pain, shortness of breath at rest or with normal exertion. No PND, orthopnea, or unexpected weight gain. No syncope or palpitations.   Past Medical History:  Diagnosis Date   Abdominal pain 05/29/2013   s/p rx of cephalo resistant e coli   but last rx NG  now residular ?  bladder sx repeat cx sx rx to ty and uro consult    ADJ DISORDER WITH MIXED ANXIETY & DEPRESSED MOOD 03/03/2010   Qualifier: Diagnosis of  By: [name], [name]    Agent resistant to multiple antibiotics 05/29/2013   e coli   bu NG on fu.     Anemia    Anxiety    ARF (acute renal failure) ([place]) 03/12/2015   Cancer ([place])    Closed head injury 02/01/2011   from syncope and had scalp laceration  neg ct .     Closed head injury 5-6 yrs ago   Colitis 09/62/8366   Complication of anesthesia    migraine several hours after general anesthesia   Depression     Fatty liver    Gall stones 2016   see ct scan neg [facility]    GERD (gastroesophageal reflux disease)    Hearing aid worn    HOH (hard of hearing)    both ears   Hyperlipidemia    Hypertension    echo nl lv function  mild dilitation 2009   Kidney infection    few yrs ago in hospital   Medication side effect 09/02/2010   Poss muscle se of 10 crestor    Migraine    hypnic HA eval by Dr. 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    Pyelonephritis 03/12/2015   Sensation of pain in anesthetized distribution of trigeminal nerve    Syncope 02/01/2011   In shower on vacation  sustained head laceration  8 sutures Had ed visit neg head ct labs and x ray    Trigeminal neuralgia pain     Past Surgical History:  Procedure Laterality Date   ABDOMINAL HYSTERECTOMY  2002   tubal   BACK SURGERY     2 times, for sciatic nerve pain   CARDIAC CATHETERIZATION  2000   chest pains neg   CHOLECYSTECTOMY N/A 02/21/2017   Procedure: LAPAROSCOPIC CHOLECYSTECTOMY WITH INTRAOPERATIVE CHOLANGIOGRAM;  Surgeon: Armandina Gemma, MD;  Location: [facility];  Service: General;  Laterality: N/A;   COLONOSCOPY     multiple   CRANIOTOMY  12/09/2011   nerve decompression right trigeminal    DIRECT LARYNGOSCOPY N/A 02/12/2021   Procedure: DIRECT LARYNGOSCOPY AND BIOPSY POSSIBLE FROZEN;  Surgeon: [name], MD;  Location: [facility];  Service: ENT;  Laterality: N/A;   DOPPLER ECHOCARDIOGRAPHY  2009   nl lv function mild lv dilitation   EYE SURGERY Bilateral    ioc for catatracts   GASTROSTOMY N/A 02/26/2021   Procedure: OPEN GASTROSTOMY TUBE PLACEMENT;  Surgeon: Armandina Gemma, MD;  Location: [facility];  Service: General;  Laterality: N/A;   IR GASTROSTOMY TUBE MOD SED  02/22/2021   laparoscopic gallbladder surgery  02/16/2017   Fax from [facility] Bilateral 2002   rt shoulder surgery      Current Medications: Current Outpatient Medications on  File Prior to Visit  Medication Sig   butorphanol (STADOL) 10 MG/ML nasal spray Place 1 spray into the nose every 4 (four) hours as needed for headache (trigeminal neuralgia.).   Calcium Carb-Cholecalciferol (CALCIUM 600+D) 600-800 MG-UNIT TABS Take 1 tablet by mouth 2 (two) times daily.   carvedilol (COREG) 25 MG tablet TAKE 1 TABLET(25 MG) BY MOUTH TWICE DAILY WITH A MEAL   conjugated estrogens (PREMARIN) vaginal cream Place 1 applicator vaginally 2 (two) times a week.   fluticasone (FLONASE) 50 MCG/ACT nasal spray Place 2 sprays into both nostrils as needed for allergies or rhinitis.   furosemide (LASIX) 20 MG tablet TAKE 1 TABLET(20 MG) BY MOUTH DAILY   lamoTRIgine (LAMICTAL) 100 MG tablet Take 100 mg by mouth 2 (two) times daily.   Lisinopril 1 MG/ML SOLN Take 20 mg  daily Per g tube   Multiple Vitamin (MULTIVITAMIN ADULT PO) Take 1 tablet by mouth daily.   Nutritional Supplements (NUTREN 1.5) LIQD 6 Cans by Enteral route 5 (five) times daily.   ondansetron (ZOFRAN-ODT) 4 MG disintegrating tablet Take 1-2 tablets (4-8 mg total) by mouth every 8 (eight) hours as needed for nausea.   OXcarbazepine (TRILEPTAL) 300 MG/5ML suspension Take 2.5 mLs (150 mg total) by mouth 2 (two) times daily.   Polyethyl Glycol-Propyl Glycol (SYSTANE) 0.4-0.3 % GEL ophthalmic gel Place 1 application into both eyes 2 (two) times daily as needed (dry eyes).   alendronate (FOSAMAX) 70 MG tablet TAKE 1 TABLET BY MOUTH 1 TIME A WEEK WITH FULL GLASS OF WATER AND ON AN EMPTY STOMACH AS DIRECTED (Patient not taking: Reported on 06/08/2021)   sucralfate (CARAFATE) 1 g tablet Take 1 tablet (1 g total) by mouth 4 (four) times daily. Dissolve each tablet in 15 cc water before use. (Patient not taking: Reported on 06/08/2021)   No current facility-administered medications on file prior to visit.     Allergies:   Hydrocodone and Sulfamethoxazole-trimethoprim   Social History   Tobacco Use   Smoking status: Never   Smokeless  tobacco: Never  Vaping Use   Vaping Use: Never used  Substance Use Topics   Alcohol use: Not Currently    Alcohol/week: 2.0 standard drinks    Types: 2 Glasses of wine per week    Comment: occ wine   Drug use: No    Family History: family history includes Alcohol abuse in her father; Cancer in her mother and paternal aunt; Diabetes in her brother; Hypertension in an other family member; Ovarian cancer in her mother; Seizures in her daughter; Stroke in her father and mother. There is no history  of Colon cancer, Dementia, or Alzheimer's disease.  ROS:   Please see the history of present illness.  Additional pertinent ROS otherwise unremarkable.  EKGs/Labs/Other Studies Reviewed:    The following studies were reviewed today: Echo 12/18/19  1. Left ventricular ejection fraction, by estimation, is 55 to 60%. The  left ventricle has normal function. The left ventricle has no regional  wall motion abnormalities. There is moderate asymmetric left ventricular  hypertrophy of the basal-septal  segment. Left ventricular diastolic parameters are consistent with Grade  II diastolic dysfunction (pseudonormalization). Elevated left atrial  pressure.   2. Right ventricular systolic function is normal. The right ventricular  size is normal. There is normal pulmonary artery systolic pressure. The  estimated right ventricular systolic pressure is 46.5 mmHg.   3. The mitral valve is normal in structure. Trivial mitral valve  regurgitation.   4. The aortic valve is tricuspid. Aortic valve regurgitation is not  visualized. No aortic stenosis is present.   5. The inferior vena cava is normal in size with <50% respiratory  variability, suggesting right atrial pressure of 8 mmHg.   Echo 11/28/2017 - Left ventricle: The cavity size was normal. There was moderate    concentric hypertrophy. Systolic function was normal. The    estimated ejection fraction was in the range of 55% to 60%. Wall    motion was  normal; there were no regional wall motion    abnormalities. Features are consistent with a pseudonormal left    ventricular filling pattern, with concomitant abnormal relaxation    and increased filling pressure (grade 2 diastolic dysfunction).  - Pulmonary arteries: PA peak pressure: 37 mm Hg (S).  - Pericardium, extracardiac: Ascites was noted.   Impressions:   - Normal LV ejection fraction and no regional wall motion    abnormalities, diastolic dysfunction noted. Elevated right sided    filling pressures. Ascites present.   EKG:  EKG is personally reviewed. 06/08/21: sinus rhythm with PACs, 77 bpm 12/04/19: sinus bradycardia at 58 bpm  Recent Labs: 03/02/2021: Magnesium 2.0 06/04/2021: ALT 19; BUN 22; Creatinine 0.70; Hemoglobin 13.2; Platelet Count 193; Potassium 4.3; Sodium 137; TSH 0.646  Recent Lipid Panel    Component Value Date/Time   CHOL 279 (H) 05/27/2019 1045   TRIG 78.0 05/27/2019 1045   TRIG 192 (H) 03/17/2006 0820   HDL 118.80 05/27/2019 1045   CHOLHDL 2 05/27/2019 1045   VLDL 15.6 05/27/2019 1045   LDLCALC 145 (H) 05/27/2019 1045   LDLDIRECT 135.9 10/16/2012 0840    Physical Exam:    VS:  BP 110/72 (BP Location: Left Arm, Patient Position: Sitting, Cuff Size: Normal)    Ht _0  (1.676 m)    Wt 127 lb (57.6 kg)    BMI 20.50 kg/m     Wt Readings from Last 3 Encounters:  06/08/21 127 lb (57.6 kg)  06/04/21 125 lb 9.6 oz (57 kg)  05/21/21 125 lb 9.6 oz (57 kg)    GEN: frail, cachectic, in no acute distress HEENT: Normal, moist mucous membranes NECK: No JVD CARDIAC: regular rhythm, normal S1 and S2, no rubs or gallops. No murmur. VASCULAR: Radial and DP pulses 2+ bilaterally. No carotid bruits RESPIRATORY:  Clear to auscultation without rales, wheezing or rhonchi  ABDOMEN: Soft, non-tender, non-distended MUSCULOSKELETAL:  Ambulates independently SKIN: Warm and dry, trivial bilateral LE edema with chronic appearing skin discoloration NEUROLOGIC:  Alert  and oriented x 3. No focal neuro deficits noted. PSYCHIATRIC:  Normal affect  ASSESSMENT:    1. Essential hypertension   2. Hyperlipidemia LDL goal <100   3. Bilateral leg edema   4. Counseling on health promotion and disease prevention     PLAN:    Lower extremity edema: improved but still present today -echo does not suggest cardiac etiology -skin discoloration suggests component of venous stasis -has not tolerated tight compression stockings in the past. Reviewed some of the milder alternative.  Labile hypertension -has been steadier and improved on current regimen. No extreme highs/lows recently -would not overtreat as she reports intermittent hypotension -instructed that pain, illness, stress, etc can raise blood pressure -she will contact me if numbers trend higher -currently on: Carvedilol 25 mg twice a day Furosemide 20 mg in the morning Lisinopril 20 mg daily  Hypercholesterolemia: Lipids reviewed from 05/27/19. Tchol 279, HDL 118, LDL 145, TG 78 We have previously discussed, given age/numbers, will not start statin  Cardiac risk counseling and prevention recommendations: -recommend heart healthy/Mediterranean diet, with whole grains, fruits, vegetable, fish, lean meats, nuts, and olive oil. Limit salt. -recommend moderate walking, 3-5 times/week for 30-50 minutes each session. Aim for at least 150 minutes.week. Goal should be pace of 3 miles/hours, or walking 1.5 miles in 30 minutes -recommend avoidance of tobacco products. Avoid excess alcohol. -ASCVD risk score: The ASCVD Risk score (Arnett DK, et al., 2019) failed to calculate for the following reasons:   The 2019 ASCVD risk score is only valid for ages 53 to 60    Plan for follow up: I will see her back in 1 year or sooner as needed  [name], MD, PhD [place]   [facility]    Medication Adjustments/Labs and Tests Ordered: Current medicines are reviewed at length with the patient today.   Concerns regarding medicines are outlined above.  Orders Placed This Encounter  Procedures   EKG 12-Lead   No orders of the defined types were placed in this encounter.   Patient Instructions  Medication Instructions:  Your Physician recommend you continue on your current medication as directed.    *If you need a refill on your cardiac medications before your next appointment, please call your pharmacy*   Lab Work: None ordered today   Testing/Procedures: None ordered today   Follow-Up: At [facility], you and your health needs are our priority.  As part of our continuing mission to provide you with exceptional heart care, we have created designated Provider Care Teams.  These Care Teams include your primary Cardiologist (physician) and Advanced Practice Providers (APPs -  Physician Assistants and Nurse Practitioners) who all work together to provide you with the care you need, when you need it.  We recommend signing up for the patient portal called "MyChart".  Sign up information is provided on this After Visit Summary.  MyChart is used to connect with patients for Virtual Visits (Telemedicine).  Patients are able to view lab/test results, encounter notes, upcoming appointments, etc.  Non-urgent messages can be sent to your provider as well.   To learn more about what you can do with MyChart, go to [email or web address].    Your next appointment:   1 year(s)  The format for your next appointment:   In Person  Provider:   [name], MD    Try Dr. Motion compression stockings (I get mine at [place]) to see if this helps. You can also try Elastic Therapy in [place].  Signed, [name], MD PhD 06/08/2021   [place]

## 2021-06-08 NOTE — Patient Instructions (Signed)
Medication Instructions:  Your Physician recommend you continue on your current medication as directed.    *If you need a refill on your cardiac medications before your next appointment, please call your pharmacy*   Lab Work: None ordered today   Testing/Procedures: None ordered today   Follow-Up: At [facility], you and your health needs are our priority.  As part of our continuing mission to provide you with exceptional heart care, we have created designated Provider Care Teams.  These Care Teams include your primary Cardiologist (physician) and Advanced Practice Providers (APPs -  Physician Assistants and Nurse Practitioners) who all work together to provide you with the care you need, when you need it.  We recommend signing up for the patient portal called "MyChart".  Sign up information is provided on this After Visit Summary.  MyChart is used to connect with patients for Virtual Visits (Telemedicine).  Patients are able to view lab/test results, encounter notes, upcoming appointments, etc.  Non-urgent messages can be sent to your provider as well.   To learn more about what you can do with MyChart, go to [email or web address].    Your next appointment:   1 year(s)  The format for your next appointment:   In Person  Provider:   [name], MD    Try Dr. Motion compression stockings (I get mine at [place]) to see if this helps. You can also try Elastic Therapy in [place].

## 2021-06-10 ENCOUNTER — Encounter: Payer: Self-pay | Admitting: Hematology

## 2021-06-10 DIAGNOSIS — C159 Malignant neoplasm of esophagus, unspecified: Secondary | ICD-10-CM | POA: Diagnosis not present

## 2021-06-10 DIAGNOSIS — R1310 Dysphagia, unspecified: Secondary | ICD-10-CM | POA: Diagnosis not present

## 2021-06-11 ENCOUNTER — Other Ambulatory Visit: Payer: Self-pay

## 2021-06-11 ENCOUNTER — Telehealth: Payer: Self-pay | Admitting: Internal Medicine

## 2021-06-11 DIAGNOSIS — R1310 Dysphagia, unspecified: Secondary | ICD-10-CM | POA: Diagnosis not present

## 2021-06-11 DIAGNOSIS — C159 Malignant neoplasm of esophagus, unspecified: Secondary | ICD-10-CM | POA: Diagnosis not present

## 2021-06-11 NOTE — Telephone Encounter (Signed)
Pt notified of Dr. Carlean Purl recommendations: Ct of Neck with IV contrast  ordered and scheduled for 06/18/2021 at 7:30 at [facility]: Pt to arrive at 7:00 Pt made aware [place] only 4 hours prior. Pt made aware Pt verbalized understanding with all questions answered.

## 2021-06-11 NOTE — Telephone Encounter (Signed)
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  She has persistent severe dysphagia and can barely tolerate small amounts of liquid only.  She has a gastrostomy tube and and is dependent upon that for nutrition.  I was messaged by Dr. [name] about reassessment for radiation stricture versus persistent cancer as a cause for her persistent dysphagia.  I am going to start with a repeat neck CT with IV contrast.  I was unable to pass a scope at all previously and am thinking she may need a repeat otolaryngology evaluation to assess things depending upon what the  CT shows.  The very proximal location of the cancer makes this a very difficult approach from a GI endoscopy standpoint.  Please do the following:  Order CT neck with IV contrast diagnosis squamous cell carcinoma of the esophagus   Reassess disease status after therapy  Note BUN and creatinine were normal January 13

## 2021-06-16 ENCOUNTER — Other Ambulatory Visit: Payer: Self-pay | Admitting: Internal Medicine

## 2021-06-16 ENCOUNTER — Other Ambulatory Visit: Payer: Self-pay | Admitting: Hematology

## 2021-06-16 ENCOUNTER — Encounter: Payer: Self-pay | Admitting: Internal Medicine

## 2021-06-16 DIAGNOSIS — C153 Malignant neoplasm of upper third of esophagus: Secondary | ICD-10-CM

## 2021-06-17 ENCOUNTER — Encounter: Payer: Self-pay | Admitting: Hematology

## 2021-06-17 ENCOUNTER — Encounter: Payer: Self-pay | Admitting: Internal Medicine

## 2021-06-17 ENCOUNTER — Encounter: Payer: Self-pay | Admitting: Nurse Practitioner

## 2021-06-17 DIAGNOSIS — Z9221 Personal history of antineoplastic chemotherapy: Secondary | ICD-10-CM | POA: Diagnosis not present

## 2021-06-17 DIAGNOSIS — Z8501 Personal history of malignant neoplasm of esophagus: Secondary | ICD-10-CM | POA: Diagnosis not present

## 2021-06-17 DIAGNOSIS — R1319 Other dysphagia: Secondary | ICD-10-CM | POA: Diagnosis not present

## 2021-06-17 DIAGNOSIS — J3489 Other specified disorders of nose and nasal sinuses: Secondary | ICD-10-CM | POA: Diagnosis not present

## 2021-06-17 NOTE — Telephone Encounter (Signed)
-----   Message from [name], MD sent at 06/17/2021  8:24 AM EST ----- Regarding: ENT referral Remo Lipps,  Please find this lady an ENT appointment - looking to have one by next week.  She had seen Dr. Radene Journey but he retired.  She has a very high or proximal esophageal cancer that required ENT to examine and diagnose in OR last year - has had treatment but is not better. She is having a CT scan tomorrow but is having trouble with secretions and I am asking for an ENT consult very soon.  Would try [facility] or Dr. Benjamine Mola.  I messaged her that we would be doing this.  Thanks  CEG

## 2021-06-17 NOTE — Telephone Encounter (Signed)
ENT [facility] notified of Dr. Carlean Purl request for referral. Appointment scheduled by Terri for 06/17/2021 at 2:30 with Dr. Wilburn Cornelia (Today) Pt husband Jenny Reichmann made aware: Address Provided: Jenny Reichmann verbalized understanding with all questions answered.  Referral and records faxed to [facility] [phone]

## 2021-06-18 ENCOUNTER — Other Ambulatory Visit: Payer: Self-pay | Admitting: Internal Medicine

## 2021-06-18 ENCOUNTER — Other Ambulatory Visit: Payer: Self-pay

## 2021-06-18 ENCOUNTER — Inpatient Hospital Stay: Payer: Medicare Other | Admitting: Hematology

## 2021-06-18 ENCOUNTER — Inpatient Hospital Stay: Payer: Medicare Other

## 2021-06-18 ENCOUNTER — Inpatient Hospital Stay: Payer: Medicare Other | Admitting: Dietician

## 2021-06-18 ENCOUNTER — Encounter: Payer: Self-pay | Admitting: Hematology

## 2021-06-18 ENCOUNTER — Encounter: Payer: Self-pay | Admitting: Internal Medicine

## 2021-06-18 ENCOUNTER — Ambulatory Visit (HOSPITAL_COMMUNITY)
Admission: RE | Admit: 2021-06-18 | Discharge: 2021-06-18 | Disposition: A | Discharge status: Home / Self Care | Payer: Medicare Other | Source: Ambulatory Visit | Attending: Internal Medicine | Admitting: Internal Medicine

## 2021-06-18 ENCOUNTER — Encounter (HOSPITAL_COMMUNITY): Payer: Self-pay

## 2021-06-18 VITALS — BP 114/72 | HR 73 | Temp 97.5°F | Resp 17 | Wt 126.2 lb

## 2021-06-18 DIAGNOSIS — C159 Malignant neoplasm of esophagus, unspecified: Secondary | ICD-10-CM | POA: Insufficient documentation

## 2021-06-18 DIAGNOSIS — C153 Malignant neoplasm of upper third of esophagus: Secondary | ICD-10-CM

## 2021-06-18 DIAGNOSIS — R1310 Dysphagia, unspecified: Secondary | ICD-10-CM

## 2021-06-18 DIAGNOSIS — F32A Depression, unspecified: Secondary | ICD-10-CM | POA: Diagnosis not present

## 2021-06-18 DIAGNOSIS — G500 Trigeminal neuralgia: Secondary | ICD-10-CM | POA: Diagnosis not present

## 2021-06-18 DIAGNOSIS — E041 Nontoxic single thyroid nodule: Secondary | ICD-10-CM | POA: Diagnosis not present

## 2021-06-18 DIAGNOSIS — R058 Other specified cough: Secondary | ICD-10-CM | POA: Diagnosis not present

## 2021-06-18 DIAGNOSIS — I10 Essential (primary) hypertension: Secondary | ICD-10-CM | POA: Diagnosis not present

## 2021-06-18 DIAGNOSIS — Z90710 Acquired absence of both cervix and uterus: Secondary | ICD-10-CM | POA: Diagnosis not present

## 2021-06-18 DIAGNOSIS — J392 Other diseases of pharynx: Secondary | ICD-10-CM | POA: Diagnosis not present

## 2021-06-18 DIAGNOSIS — M47812 Spondylosis without myelopathy or radiculopathy, cervical region: Secondary | ICD-10-CM | POA: Diagnosis not present

## 2021-06-18 DIAGNOSIS — R634 Abnormal weight loss: Secondary | ICD-10-CM | POA: Diagnosis not present

## 2021-06-18 DIAGNOSIS — Z5112 Encounter for antineoplastic immunotherapy: Secondary | ICD-10-CM | POA: Diagnosis not present

## 2021-06-18 DIAGNOSIS — Z79899 Other long term (current) drug therapy: Secondary | ICD-10-CM | POA: Diagnosis not present

## 2021-06-18 LAB — CBC WITH DIFFERENTIAL (CANCER CENTER ONLY)
Abs Immature Granulocytes: 0.01 10*3/uL (ref 0.00–0.07)
Basophils Absolute: 0 10*3/uL (ref 0.0–0.1)
Basophils Relative: 0 %
Eosinophils Absolute: 0 10*3/uL (ref 0.0–0.5)
Eosinophils Relative: 1 %
HCT: 39.1 % (ref 36.0–46.0)
Hemoglobin: 13.2 g/dL (ref 12.0–15.0)
Immature Granulocytes: 0 %
Lymphocytes Relative: 13 %
Lymphs Abs: 0.5 10*3/uL — ABNORMAL LOW (ref 0.7–4.0)
MCH: 31.3 pg (ref 26.0–34.0)
MCHC: 33.8 g/dL (ref 30.0–36.0)
MCV: 92.7 fL (ref 80.0–100.0)
Monocytes Absolute: 0.4 10*3/uL (ref 0.1–1.0)
Monocytes Relative: 9 %
Neutro Abs: 3.3 10*3/uL (ref 1.7–7.7)
Neutrophils Relative %: 77 %
Platelet Count: 155 10*3/uL (ref 150–400)
RBC: 4.22 MIL/uL (ref 3.87–5.11)
RDW: 14.4 % (ref 11.5–15.5)
WBC Count: 4.2 10*3/uL (ref 4.0–10.5)
nRBC: 0 % (ref 0.0–0.2)

## 2021-06-18 LAB — CMP (CANCER CENTER ONLY)
ALT: 17 U/L (ref 0–44)
AST: 20 U/L (ref 15–41)
Albumin: 3.5 g/dL (ref 3.5–5.0)
Alkaline Phosphatase: 76 U/L (ref 38–126)
Anion gap: 4 — ABNORMAL LOW (ref 5–15)
BUN: 23 mg/dL (ref 8–23)
CO2: 31 mmol/L (ref 22–32)
Calcium: 9.3 mg/dL (ref 8.9–10.3)
Chloride: 96 mmol/L — ABNORMAL LOW (ref 98–111)
Creatinine: 0.63 mg/dL (ref 0.44–1.00)
GFR, Estimated: >60 mL/min (ref >60)
Glucose, Bld: 206 mg/dL — ABNORMAL HIGH (ref 70–99)
Potassium: 4.4 mmol/L (ref 3.5–5.1)
Sodium: 131 mmol/L — ABNORMAL LOW (ref 135–145)
Total Bilirubin: 0.6 mg/dL (ref 0.3–1.2)
Total Protein: 6.2 g/dL — ABNORMAL LOW (ref 6.5–8.1)

## 2021-06-18 LAB — TSH: TSH: 0.534 u[IU]/mL (ref 0.308–3.960)

## 2021-06-18 MED ORDER — IOHEXOL 300 MG/ML  SOLN
75.0000 mL | Freq: Once | INTRAMUSCULAR | Status: AC | PRN
  Administered 2021-06-18: 75 mL via INTRAVENOUS

## 2021-06-18 MED ORDER — SODIUM CHLORIDE 0.9 % IV SOLN
240.0000 mg | Freq: Once | INTRAVENOUS | Status: AC
  Administered 2021-06-18: 240 mg via INTRAVENOUS
  Filled 2021-06-18: qty 24

## 2021-06-18 MED ORDER — SODIUM CHLORIDE 0.9 % IV SOLN: Freq: Once | INTRAVENOUS | Status: AC

## 2021-06-18 MED ORDER — SODIUM CHLORIDE (PF) 0.9 % IJ SOLN
INTRAMUSCULAR | Status: AC
  Filled 2021-06-18: qty 50

## 2021-06-18 NOTE — Progress Notes (Signed)
[name]   Telephone:([phone]) [phone] [phone]   Clinic Follow up Note   Patient Care Team: [name], [name], MD as PCP - General [name], MD as PCP - Cardiology (Cardiology) Cindie Crumbly (Neurosurgery) Irine Seal, MD as Attending Physician (Urology) Erline Levine, MD as Consulting Physician (Neurosurgery) [name], MD as Consulting Physician (Neurology) Viona Gilmore, [facility] as Pharmacist (Pharmacist) [name], MD as Consulting Physician (Gastroenterology) Carol Ada, MD as Consulting Physician (Gastroenterology) [name], MD (Inactive) as Consulting Physician (Otolaryngology) [name], MD as Consulting Physician (Hematology) Kyung Rudd, MD as Consulting Physician (Radiation Oncology) Kyung Rudd, MD as Consulting Physician (Radiation Oncology)  Date of Service:  06/18/2021  CHIEF COMPLAINT: f/u of esophageal cancer  CURRENT THERAPY:  Nivolumab q2weeks, started 05/21/21  ASSESSMENT & PLAN:  [name] is a 81 y.o. female with   1. Squamous cell carcinoma of cervical esophagus, cTxN2M0 -presented with dysphagia and weight loss. EGD 9/15 and CT neck 02/05/21 showed 6 cm mass in the cervical/upper esophagus with possible local adenopathy. Path confirmed squamous cell carcinoma of the esophagus, PDL1 testing 95% positive -She had feeding tube placed by Dr. Harlow Asa 02/22/21  -PET scan 02/23/21 shows hypermetabolic esophageal mass and hypermetabolic paratracheal nodes, negative for distant metastasis.  -due to the location in the cervical esophagus, Dr. Kipp Brood feels she is not a surgical candidate.  -she received concurrent chemoRT with carbo/taxol 10/17-11/23/22. She required dose reduction with last cycle due to radiation dermatitis, hoarseness, dysphagia, and odynophagia.  -given PDL1+, she began immunotherapy with Nivolumab on 05/21/21. She will receive it every 2 weeks for the first two months,  then move to every 4 weeks if she tolerates well. Will plan to give this for a total of a year if NED. -she recently saw ENT Dr.Shoemaker  -neck CT performed earlier today (06/18/21) showed decrease in size of mass-like enlargement of cervical esophagus and size of lymph nodes. I reviewed the results with her today. -plan to repeat PET scan in early March  -labs reviewed, overall adequate to proceed with Nivo today. -she will see Dr. Carlean Purl on Monday for EGD    2. Dysphagia, weight loss  -Secondary to #1 -She underwent G-tube placement by Dr. Harlow Asa on 02/22/21 -she is currently unable to swallow, currently taking in nutrition only by feeding tube, 5 times a day. Weight overall stable. -she was seen by Dr. Wilburn Cornelia, who did not see any evidence of esophageal mass. -she underwent neck CT earlier today (06/18/21) showing decrease in size of esophageal mass and lymph nodes. -she will undergo EGD with Dr. Carlean Purl on Monday, 06/21/21.   3. 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 Trigeminal neuralgia, sciatica, headaches -On lamotrigene and oxcarbazepine  -pain has worsened before and during treatment -f/up Dr. Jaynee Eagles    5. HTN -continue benazepril, furosemide - I reduced benazepril to once daily from 03/09/21, and let her know to hold lasix if she becomes dizzy at home   -monitoring closely    6. Goals of Care/Code Status -Code status was DNR from hospitalization 05/16/20, changed to full code during 02/26/2021 hospitalization for feeding tube placement       PLAN: -proceed with Nivo today and every 2 weeks -EGD 06/21/21 with Dr. Carlean Purl -lab, f/u, and Nivo as  scheduled in 2 and 4 weeks   No problem-specific Assessment & Plan notes found for this encounter.   SUMMARY  OF ONCOLOGIC HISTORY: Oncology History  Malignant neoplasm of upper third esophagus ([place])  12/21/2020 Imaging   Laryngoscopy Comments:    On fiberoptic laryngoscopy through the right nostril the nasopharynx was clear.  The base of tongue vallecula and epiglottis were normal.    Piriform sinuses were clear bilaterally and vocal cords were clear with  normal vocal mobility.  No structural abnormalities noted.   01/22/2021 Imaging   DG esophagus IMPRESSION: 1. Luminal narrowing in the high cervical esophagus over a 3 cm segment is highly concerning for esophageal neoplasm. Inflammatory process would be a secondary consideration. Luminal narrowing occurs approximately at the C5-C7 vertebral body level just below the glottis. Patient experienced several episodes of choking related to this luminal narrowing. Recommend expedient upper GI endoscopy for evaluation. 2. Distal thoracic esophagus and GE junction appear normal.   02/04/2021 Procedure   EGD by Dr. Carlean Purl impression- One mass-like severe stenosis was found in the upper third of the esophagus. The stenosis was not traversed.   02/05/2021 Imaging   CT soft tissue neck w contrast IMPRESSION: Mass-like soft tissue thickening and mucosal hyperenhancement of the cervical esophagus and hypopharynx, spanning the C4-T1 levels, likely reflecting an esophageal malignancy. This measures up to 2.4 x 3.4 cm in transaxial dimensions, and 6.3 cm in craniocaudal dimension. Associated severe effacement of the esophageal lumen.   Centrally necrotic right paratracheal lymph node beneath the level of the mass, measuring 1.3 x 0.9 cm, and likely reflecting a site of nodal metastatic disease.   Additional lymph nodes along the posterior and inferior aspect of the right thyroid lobe (along the right aspect of the esophageal mass), which measure subcentimeter but are asymmetrically prominent and highly suspicious for additional sites of nodal metastatic disease.    02/12/2021 Pathology Results   FINAL MICROSCOPIC DIAGNOSIS:  A. ESOPHAGUS, UPPER CERVICAL #1, BIOPSY:  - Squamous cell carcinoma.  B. ESOPHAGUS, UPPER CERVICAL #2, BIOPSY:  - Squamous cell carcinoma.    02/12/2021 Cancer Staging   Staging form: Esophagus - Squamous Cell Carcinoma, AJCC 8th Edition - Clinical stage from 02/12/2021: Stage Unknown (cTX, cN2, cM0) - Signed by [name], MD on 03/08/2021 Stage prefix: Initial diagnosis    02/18/2021 Initial Diagnosis   Malignant neoplasm of upper third esophagus ([place])   02/23/2021 PET scan   IMPRESSION: Markedly hypermetabolic focal masslike thickening of the cervical esophagus, compatible with primary esophageal malignancy.   Markedly hypermetabolic right upper paratracheal lymph node, compatible with metastatic disease.   Small right upper paratracheal and right level IIA lymph nodes with mild hypermetabolic activity, concerning for additional sites of nodal metastatic disease.   Aortic Atherosclerosis (ICD10-I70.0).   03/09/2021 - 04/13/2021 Chemotherapy   Patient is on Treatment Plan : ESOPHAGUS Carboplatin/PACLitaxel weekly x 6 weeks with XRT       05/21/2021 -  Chemotherapy   Patient is on Treatment Plan : GASTROESOPHAGEAL Nivolumab q14d x 8 cycles / Nivolumab q28d        INTERVAL HISTORY:  Shakena Callari is here for a follow up of esophageal cancer. She was last seen by me on 06/04/21. She presents to the clinic accompanied by her husband. She denies pain but reports a feeling of something in her throat. She denies any fatigue or side effects from the nivo. She does note itching to her mid-lower back.   All other systems were  reviewed with the patient and are negative.  MEDICAL HISTORY:  Past Medical History:  Diagnosis Date   Abdominal pain 05/29/2013   s/p rx of cephalo resistant e coli   but last rx NG  now residular ?  bladder sx repeat cx sx rx to ty and uro consult    ADJ DISORDER WITH MIXED ANXIETY &  DEPRESSED MOOD 03/03/2010   Qualifier: Diagnosis of  By: [name], [name]    Agent resistant to multiple antibiotics 05/29/2013   e coli   bu NG on fu.     Anemia    Anxiety    ARF (acute renal failure) (HCC) 03/12/2015   Closed head injury 02/01/2011   from syncope and had scalp laceration  neg ct .     Closed head injury 5-6 yrs ago   Colitis 28/41/3244   Complication of anesthesia    migraine several hours after general anesthesia   Depression    esophageal ca 01/2021   Fatty liver    Gall stones 2016   see ct scan neg [facility]    GERD (gastroesophageal reflux disease)    Hearing aid worn    HOH (hard of hearing)    both ears   Hyperlipidemia    Hypertension    echo nl lv function  mild dilitation 2009   Kidney infection    few yrs ago in hospital   Medication side effect 09/02/2010   Poss muscle se of 10 crestor    Migraine    hypnic HA eval by Dr. 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    Pyelonephritis 03/12/2015   Sensation of pain in anesthetized distribution of trigeminal nerve    Syncope 02/01/2011   In shower on vacation  sustained head laceration  8 sutures Had ed visit neg head ct labs and x ray    Trigeminal neuralgia pain     SURGICAL HISTORY: Past Surgical History:  Procedure Laterality Date   ABDOMINAL HYSTERECTOMY  2002   tubal   BACK SURGERY     2 times, for sciatic nerve pain   CARDIAC CATHETERIZATION  2000   chest pains neg   CHOLECYSTECTOMY N/A 02/21/2017   Procedure: LAPAROSCOPIC CHOLECYSTECTOMY WITH INTRAOPERATIVE CHOLANGIOGRAM;  Surgeon: Armandina Gemma, MD;  Location: [facility];  Service: General;  Laterality: N/A;   COLONOSCOPY     multiple   CRANIOTOMY  12/09/2011   nerve decompression right trigeminal    DIRECT LARYNGOSCOPY N/A 02/12/2021   Procedure: DIRECT LARYNGOSCOPY AND BIOPSY POSSIBLE FROZEN;  Surgeon: [name], MD;  Location: [facility];  Service: ENT;  Laterality: N/A;   DOPPLER  ECHOCARDIOGRAPHY  2009   nl lv function mild lv dilitation   EYE SURGERY Bilateral    ioc for catatracts   GASTROSTOMY N/A 02/26/2021   Procedure: OPEN GASTROSTOMY TUBE PLACEMENT;  Surgeon: Armandina Gemma, MD;  Location: [facility];  Service: General;  Laterality: N/A;   IR GASTROSTOMY TUBE MOD SED  02/22/2021   laparoscopic gallbladder surgery  02/16/2017   Fax from [facility] Bilateral 2002   rt shoulder surgery      I have reviewed the social history and family history with the patient and they are unchanged from previous note.  ALLERGIES:  is allergic to hydrocodone and sulfamethoxazole-trimethoprim.  MEDICATIONS:  Current Outpatient Medications  Medication Sig Dispense Refill   alendronate (FOSAMAX) 70 MG tablet TAKE  1 TABLET BY MOUTH 1 TIME A WEEK WITH FULL GLASS OF WATER AND ON AN EMPTY STOMACH AS DIRECTED (Patient not taking: Reported on 06/08/2021) 4 tablet 2   butorphanol (STADOL) 10 MG/ML nasal spray Place 1 spray into the nose every 4 (four) hours as needed for headache (trigeminal neuralgia.). 2.5 mL 0   Calcium Carb-Cholecalciferol (CALCIUM 600+D) 600-800 MG-UNIT TABS Take 1 tablet by mouth 2 (two) times daily.     carvedilol (COREG) 25 MG tablet TAKE 1 TABLET(25 MG) BY MOUTH TWICE DAILY WITH A MEAL 60 tablet 3   conjugated estrogens (PREMARIN) vaginal cream Place 1 applicator vaginally 2 (two) times a week.     fluticasone (FLONASE) 50 MCG/ACT nasal spray Place 2 sprays into both nostrils as needed for allergies or rhinitis. 16 g 5   furosemide (LASIX) 20 MG tablet TAKE 1 TABLET(20 MG) BY MOUTH DAILY 90 tablet 0   lamoTRIgine (LAMICTAL) 100 MG tablet Take 100 mg by mouth 2 (two) times daily.     Lisinopril 1 MG/ML SOLN Take 20 mg  daily Per g tube 600 mL 2   Multiple Vitamin (MULTIVITAMIN ADULT PO) Take 1 tablet by mouth daily.     Nutritional Supplements (NUTREN 1.5) LIQD 6 Cans by Enteral route 5 (five) times daily. 1500 mL 6   ondansetron  (ZOFRAN-ODT) 4 MG disintegrating tablet Take 1-2 tablets (4-8 mg total) by mouth every 8 (eight) hours as needed for nausea. 60 tablet 3   OXcarbazepine (TRILEPTAL) 300 MG/5ML suspension Take 2.5 mLs (150 mg total) by mouth 2 (two) times daily. 250 mL 3   Polyethyl Glycol-Propyl Glycol (SYSTANE) 0.4-0.3 % GEL ophthalmic gel Place 1 application into both eyes 2 (two) times daily as needed (dry eyes).     prochlorperazine (COMPAZINE) 10 MG tablet TAKE 1 TABLET(10 MG) BY MOUTH EVERY 6 HOURS AS NEEDED FOR NAUSEA OR VOMITING 30 tablet 1   No current facility-administered medications for this visit.    PHYSICAL EXAMINATION: ECOG PERFORMANCE STATUS: 2 - Symptomatic, <50% confined to bed  Vitals:   06/18/21 1224  BP: 114/72  Pulse: 73  Resp: 17  Temp: (!) 97.5 F (36.4 C)  SpO2: 98%   Wt Readings from Last 3 Encounters:  06/18/21 126 lb 3.2 oz (57.2 kg)  06/08/21 127 lb (57.6 kg)  06/04/21 125 lb 9.6 oz (57 kg)     GENERAL:alert, no distress and comfortable SKIN: skin color normal, no rashes or significant lesions EYES: normal, Conjunctiva are pink and non-injected, sclera clear  NEURO: alert & oriented x 3 with fluent speech  LABORATORY DATA:  I have reviewed the data as listed CBC Latest Ref Rng & Units 06/18/2021 06/04/2021 05/21/2021  WBC 4.0 - 10.5 K/uL 4.2 4.8 6.8  Hemoglobin 12.0 - 15.0 g/dL 13.2 13.2 13.2  Hematocrit 36.0 - 46.0 % 39.1 40.1 39.3  Platelets 150 - 400 K/uL 155 193 227     CMP Latest Ref Rng & Units 06/18/2021 06/04/2021 05/21/2021  Glucose 70 - 99 mg/dL 206(H) 176(H) 107(H)  BUN 8 - 23 mg/dL 23 22 28(H)  Creatinine 0.44 - 1.00 mg/dL 0.63 0.70 0.74  Sodium 135 - 145 mmol/L 131(L) 137 137  Potassium 3.5 - 5.1 mmol/L 4.4 4.3 4.3  Chloride 98 - 111 mmol/L 96(L) 100 99  CO2 22 - 32 mmol/L 31 32 32  Calcium 8.9 - 10.3 mg/dL 9.3 9.5 9.9  Total Protein 6.5 - 8.1 g/dL 6.2(L) 6.5 6.5  Total Bilirubin 0.3 - 1.2 mg/dL  0.6 0.6 0.6  Alkaline Phos 38 - 126 U/L 76 80 78   AST 15 - 41 U/L _0 ALT 0 - 44 U/L _1 RADIOGRAPHIC STUDIES: I have personally reviewed the radiological images as listed and agreed with the findings in the report. CT SOFT TISSUE NECK W CONTRAST  Result Date: 06/18/2021 CLINICAL DATA:  Squamous cell carcinoma of the esophagus. EXAM: CT NECK WITH CONTRAST TECHNIQUE: Multidetector CT imaging of the neck was performed using the standard protocol following the bolus administration of intravenous contrast. RADIATION DOSE REDUCTION: This exam was performed according to the departmental dose-optimization program which includes automated exposure control, adjustment of the mA and/or kV according to patient size and/or use of iterative reconstruction technique. CONTRAST:  68m OMNIPAQUE IOHEXOL 300 MG/ML  SOLN COMPARISON:  Neck CT 02/05/2021. PET-CT 02/23/2021. FINDINGS: Pharynx and larynx: No oropharyngeal, nasopharyngeal, or laryngeal mass. Masslike enlargement of the cervical esophagus extending into the region of the hypopharynx has decreased from the prior neck CT, now measuring up to 2.4 x 1.4 cm in diameter on axial images (previously 3.4 x 2.4 cm). Widely patent airway. Salivary glands: No inflammation, mass, or stone. Thyroid: Numerous small bilateral thyroid nodules are again seen and appear largely unchanged aside from a partially cystic nodule in the right upper pole which has enlarged and now measures 12 mm. Lymph nodes: Right lower paratracheal lymph nodes have all decreased in size with the dominant, necrotic node on the prior study now measuring 4 mm in short axis (series 2, image 92, previously 9 mm). No new enlarged cervical lymph nodes are identified. Vascular: Major vascular structures of the neck are grossly patent. Mild atherosclerotic plaque at the right greater than left carotid bifurcations. Limited intracranial: Prior right retrosigmoid craniotomy. Partially visualized subcentimeter enhancing focus to the right of the  pons in the region of the cisternal segment of the trigeminal nerve, unchanged from the prior neck CT and more fully evaluated on a 07/08/2020 brain MRI (potentially postoperative in the setting of prior trigeminal nerve decompression). Visualized orbits: Bilateral cataract extraction. Mastoids and visualized paranasal sinuses: Clear. Skeleton: Unchanged small calcifications in the soft tissues adjacent to the left mandibular body. No suspicious osseous lesion. Cervical disc degeneration, greatest at C5-6. Advanced multilevel cervical facet arthrosis. Upper chest: No apical lung consolidation or mass. Other: None. IMPRESSION: 1. Decreased masslike enlargement of the cervical esophagus and decreased size of right lower paratracheal lymph nodes. 2. No evidence of new metastatic disease in the neck. Electronically Signed   By: ALogan BoresM.D.   On: 06/18/2021 10:10      No orders of the defined types were placed in this encounter.  All questions were answered. The patient knows to call the clinic with any problems, questions or concerns. No barriers to learning was detected. The total time spent in the appointment was 30 minutes.     YTruitt Merle MD 06/18/2021   I, KWilburn Mylar am acting as scribe for YTruitt Merle MD.   I have reviewed the above documentation for accuracy and completeness, and I agree with the above.

## 2021-06-18 NOTE — Progress Notes (Signed)
Nutrition Follow-up:  Patient completed concurrent chemoradiation for esophageal cancer. She is currently receiving Nivolumab q2 weeks, started 05/21/21.  Met with patient during infusion. She continues to have thickened saliva. She is using baking soda salt water rinses. Patient reports receiving shipment of formula and supplies. Patient has not switched to Nutren formula, reports currently having a few Osmolite 1.5 left from complimentary case provided. Patient reports she has not increased to 6 cartons/day. She reports waking up feeling thirsty. Patient asking for clarification on additional water flushes. She is not eating or drinking by mouth. Patient is scheduled for EGD with Dr. Rush Landmark on 1/30.   Medications: reviewed  Labs: Na 131, Glucose 206  Anthropometrics: Weight 126 lb 3.2 oz today - stable   1/17 - 127 lb  1/13 - 125 lb 9.6 oz 12/30 - 125 lb 9.6 oz 12/8 - 131 lb    Estimated Energy Needs (revised per 1/10 note to promote weight gain)  Kcals: 2947-6546 Protein: 85-100 Fluid: 2.2 L  NUTRITION DIAGNOSIS: Inadequate oral intake continues    INTERVENTION:  Encouraged pt to increase tube feedings to 6 cartons/day to promote weight gain.  Nutren 1.5 - 6 cartons split over 5 feedings/day. Flush tube with 60 ml water before and 120 ml water after each feeding. Provides 2250 kcal, 102 grams protein, 1146 ml free water from formula (2046 ml total water with flushes) Patient encouraged fluids by mouth as tolerated     MONITORING, EVALUATION, GOAL: weight trends, intake    NEXT VISIT: Friday February 10 during infusion

## 2021-06-18 NOTE — Patient Instructions (Signed)
[facility] ONCOLOGY   Discharge Instructions: Thank you for choosing [facility] to provide your oncology and hematology care.   If you have a lab appointment with the [facility], please go directly to the [facility] and check in at the registration area.   Wear comfortable clothing and clothing appropriate for easy access to any Portacath or PICC line.   We strive to give you quality time with your provider. You may need to reschedule your appointment if you arrive late (15 or more minutes).  Arriving late affects you and other patients whose appointments are after yours.  Also, if you miss three or more appointments without notifying the office, you may be dismissed from the clinic at the providers discretion.      For prescription refill requests, have your pharmacy contact our office and allow 72 hours for refills to be completed.    Today you received the following chemotherapy and/or immunotherapy agents: nivolumab      To help prevent nausea and vomiting after your treatment, we encourage you to take your nausea medication as directed.  BELOW ARE SYMPTOMS THAT SHOULD BE REPORTED IMMEDIATELY: *FEVER GREATER THAN 100.4 F (38 C) OR HIGHER *CHILLS OR SWEATING *NAUSEA AND VOMITING THAT IS NOT CONTROLLED WITH YOUR NAUSEA MEDICATION *UNUSUAL SHORTNESS OF BREATH *UNUSUAL BRUISING OR BLEEDING *URINARY PROBLEMS (pain or burning when urinating, or frequent urination) *BOWEL PROBLEMS (unusual diarrhea, constipation, pain near the anus) TENDERNESS IN MOUTH AND THROAT WITH OR WITHOUT PRESENCE OF ULCERS (sore throat, sores in mouth, or a toothache) UNUSUAL RASH, SWELLING OR PAIN  UNUSUAL VAGINAL DISCHARGE OR ITCHING   Items with * indicate a potential emergency and should be followed up as soon as possible or go to the Emergency Department if any problems should occur.  Please show the CHEMOTHERAPY ALERT CARD or IMMUNOTHERAPY ALERT CARD at check-in  to the Emergency Department and triage nurse.  Should you have questions after your visit or need to cancel or reschedule your appointment, please contact [facility]  Dept: [phone]  and follow the prompts.  Office hours are 8:00 a.m. to 4:30 p.m. Monday - Friday. Please note that voicemails left after 4:00 p.m. may not be returned until the following business day.  We are closed weekends and major holidays. You have access to a nurse at all times for urgent questions. Please call the main number to the clinic Dept: [phone] and follow the prompts.   For any non-urgent questions, you may also contact your provider using MyChart. We now offer e-Visits for anyone 71 and older to request care online for non-urgent symptoms. For details visit [email or web address].   Also download the MyChart app! Go to the app store, search "MyChart", open the app, select [facility], and log in with your MyChart username and password.  Due to Covid, a mask is required upon entering the hospital/clinic. If you do not have a mask, one will be given to you upon arrival. For doctor visits, patients may have 1 support person aged 65 or older with them. For treatment visits, patients cannot have anyone with them due to current Covid guidelines and our immunocompromised population.

## 2021-06-19 NOTE — Anesthesia Preprocedure Evaluation (Addendum)
Anesthesia Evaluation  Patient identified by MRN, date of birth, ID band Patient awake    Reviewed: Allergy & Precautions, NPO status , Patient's Chart, lab work & pertinent test results  History of Anesthesia Complications (+) history of anesthetic complications (migraine after anesthesia)  Airway Mallampati: III  TM Distance: >3 FB Neck ROM: Full  Mouth opening: Limited Mouth Opening  Dental no notable dental hx. (+) Teeth Intact, Dental Advisory Given   Pulmonary neg pulmonary ROS,    Pulmonary exam normal breath sounds clear to auscultation       Cardiovascular hypertension, Pt. on medications +CHF (grade 2 diastolic dysfunction)  Normal cardiovascular exam Rhythm:Regular Rate:Normal  Echo 2019: 1. Left ventricular ejection fraction, by estimation, is 55 to 60%. The  left ventricle has normal function. The left ventricle has no regional  wall motion abnormalities. There is moderate asymmetric left ventricular  hypertrophy of the basal-septal  segment. Left ventricular diastolic parameters are consistent with Grade  II diastolic dysfunction (pseudonormalization). Elevated left atrial  pressure.  2. Right ventricular systolic function is normal. The right ventricular  size is normal. There is normal pulmonary artery systolic pressure. The  estimated right ventricular systolic pressure is 95.6 mmHg.  3. The mitral valve is normal in structure. Trivial mitral valve  regurgitation.  4. The aortic valve is tricuspid. Aortic valve regurgitation is not  visualized. No aortic stenosis is present.  5. The inferior vena cava is normal in size with <50% respiratory  variability, suggesting right atrial pressure of 8 mmHg.    Neuro/Psych  Headaches, PSYCHIATRIC DISORDERS Anxiety Depression  Neuromuscular disease (trigeminal neuralgia)    GI/Hepatic Neg liver ROS, GERD  Controlled,Hx esophageal ca, dysphagia    Endo/Other   negative endocrine ROS  Renal/GU negative Renal ROS  negative genitourinary   Musculoskeletal  (+) Arthritis , Osteoarthritis,    Abdominal   Peds  Hematology negative hematology ROS (+) hct 39.1   Anesthesia Other Findings   Reproductive/Obstetrics negative OB ROS                            Anesthesia Physical Anesthesia Plan  ASA: 3  Anesthesia Plan: General   Post-op Pain Management:    Induction:   PONV Risk Score and Plan: 2 and Propofol infusion, TIVA, Treatment may vary due to age or medical condition and Ondansetron  Airway Management Planned: Oral ETT  Additional Equipment: None  Intra-op Plan:   Post-operative Plan: Extubation in OR  Informed Consent: I have reviewed the patients History and Physical, chart, labs and discussed the procedure including the risks, benefits and alternatives for the proposed anesthesia with the patient or authorized representative who has indicated his/her understanding and acceptance.   Patient has DNR.  Discussed DNR with patient and Suspend DNR.   Dental advisory given  Plan Discussed with:   Anesthesia Plan Comments: (D/w pt DNR- wishes to rescind for intraop Intubated per Dr. Rush Landmark request. Pt has history of neck radiation, potentially difficult egd/dilation)       Anesthesia Quick Evaluation

## 2021-06-20 LAB — T4: T4, Total: 6.3 ug/dL (ref 4.5–12.0)

## 2021-06-21 ENCOUNTER — Encounter (HOSPITAL_COMMUNITY): Payer: Self-pay | Admitting: Gastroenterology

## 2021-06-21 ENCOUNTER — Encounter (HOSPITAL_COMMUNITY)
Admission: RE | Disposition: A | Discharge status: Home / Self Care | Payer: Self-pay | Source: Home / Self Care | Attending: Gastroenterology

## 2021-06-21 ENCOUNTER — Ambulatory Visit (HOSPITAL_COMMUNITY): Payer: Medicare Other | Admitting: Anesthesiology

## 2021-06-21 ENCOUNTER — Ambulatory Visit (HOSPITAL_COMMUNITY): Payer: Medicare Other

## 2021-06-21 ENCOUNTER — Ambulatory Visit (HOSPITAL_COMMUNITY)
Admission: RE | Admit: 2021-06-21 | Discharge: 2021-06-21 | Disposition: A | Discharge status: Home / Self Care | Payer: Medicare Other | Attending: Gastroenterology | Admitting: Gastroenterology

## 2021-06-21 ENCOUNTER — Other Ambulatory Visit: Payer: Self-pay

## 2021-06-21 DIAGNOSIS — Z85828 Personal history of other malignant neoplasm of skin: Secondary | ICD-10-CM | POA: Diagnosis not present

## 2021-06-21 DIAGNOSIS — Z9889 Other specified postprocedural states: Secondary | ICD-10-CM | POA: Diagnosis not present

## 2021-06-21 DIAGNOSIS — C159 Malignant neoplasm of esophagus, unspecified: Secondary | ICD-10-CM

## 2021-06-21 DIAGNOSIS — K219 Gastro-esophageal reflux disease without esophagitis: Secondary | ICD-10-CM | POA: Diagnosis not present

## 2021-06-21 DIAGNOSIS — K2210 Ulcer of esophagus without bleeding: Secondary | ICD-10-CM

## 2021-06-21 DIAGNOSIS — K2970 Gastritis, unspecified, without bleeding: Secondary | ICD-10-CM | POA: Diagnosis not present

## 2021-06-21 DIAGNOSIS — K222 Esophageal obstruction: Secondary | ICD-10-CM | POA: Diagnosis not present

## 2021-06-21 DIAGNOSIS — K319 Disease of stomach and duodenum, unspecified: Secondary | ICD-10-CM | POA: Insufficient documentation

## 2021-06-21 DIAGNOSIS — Z9221 Personal history of antineoplastic chemotherapy: Secondary | ICD-10-CM | POA: Diagnosis not present

## 2021-06-21 DIAGNOSIS — I509 Heart failure, unspecified: Secondary | ICD-10-CM | POA: Insufficient documentation

## 2021-06-21 DIAGNOSIS — R1310 Dysphagia, unspecified: Secondary | ICD-10-CM | POA: Diagnosis not present

## 2021-06-21 DIAGNOSIS — I110 Hypertensive heart disease with heart failure: Secondary | ICD-10-CM | POA: Insufficient documentation

## 2021-06-21 DIAGNOSIS — Z8501 Personal history of malignant neoplasm of esophagus: Secondary | ICD-10-CM | POA: Insufficient documentation

## 2021-06-21 DIAGNOSIS — K9423 Gastrostomy malfunction: Secondary | ICD-10-CM | POA: Diagnosis not present

## 2021-06-21 DIAGNOSIS — K223 Perforation of esophagus: Secondary | ICD-10-CM

## 2021-06-21 DIAGNOSIS — K2971 Gastritis, unspecified, with bleeding: Secondary | ICD-10-CM | POA: Diagnosis not present

## 2021-06-21 DIAGNOSIS — R933 Abnormal findings on diagnostic imaging of other parts of digestive tract: Secondary | ICD-10-CM | POA: Diagnosis not present

## 2021-06-21 DIAGNOSIS — Z0389 Encounter for observation for other suspected diseases and conditions ruled out: Secondary | ICD-10-CM | POA: Diagnosis not present

## 2021-06-21 DIAGNOSIS — R948 Abnormal results of function studies of other organs and systems: Secondary | ICD-10-CM | POA: Insufficient documentation

## 2021-06-21 DIAGNOSIS — K2289 Other specified disease of esophagus: Secondary | ICD-10-CM | POA: Diagnosis not present

## 2021-06-21 HISTORY — PX: ESOPHAGOGASTRODUODENOSCOPY (EGD) WITH PROPOFOL: SHX5813

## 2021-06-21 HISTORY — PX: BALLOON DILATION: SHX5330

## 2021-06-21 HISTORY — PX: SAVORY DILATION: SHX5439

## 2021-06-21 SURGERY — ESOPHAGOGASTRODUODENOSCOPY (EGD) WITH PROPOFOL
Anesthesia: Monitor Anesthesia Care

## 2021-06-21 MED ORDER — DEXAMETHASONE SODIUM PHOSPHATE 10 MG/ML IJ SOLN
INTRAMUSCULAR | Status: DC | PRN
  Administered 2021-06-21: 4 mg via INTRAVENOUS

## 2021-06-21 MED ORDER — CIPROFLOXACIN HCL 500 MG PO TABS: 500.0000 mg | ORAL_TABLET | Freq: Two times a day (BID) | ORAL | 0 refills | Status: AC

## 2021-06-21 MED ORDER — PROPOFOL 500 MG/50ML IV EMUL
INTRAVENOUS | Status: DC | PRN
  Administered 2021-06-21: 180 ug/kg/min via INTRAVENOUS

## 2021-06-21 MED ORDER — SODIUM CHLORIDE 0.9 % IV SOLN: INTRAVENOUS | Status: DC

## 2021-06-21 MED ORDER — SODIUM CHLORIDE 0.9 % IV SOLN
INTRAVENOUS | Status: DC | PRN
  Administered 2021-06-21: 13 mL

## 2021-06-21 MED ORDER — LIDOCAINE VISCOUS HCL 2 % MT SOLN
OROMUCOSAL | Status: AC
  Filled 2021-06-21: qty 15

## 2021-06-21 MED ORDER — FENTANYL CITRATE (PF) 100 MCG/2ML IJ SOLN
12.5000 ug | Freq: Once | INTRAMUSCULAR | Status: AC
  Administered 2021-06-21: 12.5 ug via INTRAVENOUS

## 2021-06-21 MED ORDER — PHENYLEPHRINE 40 MCG/ML (10ML) SYRINGE FOR IV PUSH (FOR BLOOD PRESSURE SUPPORT)
PREFILLED_SYRINGE | INTRAVENOUS | Status: DC | PRN
  Administered 2021-06-21: 80 ug via INTRAVENOUS
  Administered 2021-06-21: 120 ug via INTRAVENOUS
  Administered 2021-06-21: 80 ug via INTRAVENOUS

## 2021-06-21 MED ORDER — SUCCINYLCHOLINE CHLORIDE 200 MG/10ML IV SOSY
PREFILLED_SYRINGE | INTRAVENOUS | Status: DC | PRN
  Administered 2021-06-21: 80 mg via INTRAVENOUS

## 2021-06-21 MED ORDER — PROPOFOL 10 MG/ML IV BOLUS
INTRAVENOUS | Status: DC | PRN
  Administered 2021-06-21: 50 mg via INTRAVENOUS
  Administered 2021-06-21: 100 mg via INTRAVENOUS
  Administered 2021-06-21: 30 mg via INTRAVENOUS

## 2021-06-21 MED ORDER — FENTANYL CITRATE (PF) 100 MCG/2ML IJ SOLN
INTRAMUSCULAR | Status: AC
  Filled 2021-06-21: qty 2

## 2021-06-21 MED ORDER — LIDOCAINE VISCOUS HCL 2 % MT SOLN
15.0000 mL | Freq: Once | OROMUCOSAL | Status: AC
  Administered 2021-06-21: 15 mL via OROMUCOSAL

## 2021-06-21 MED ORDER — ONDANSETRON HCL 4 MG/2ML IJ SOLN
INTRAMUSCULAR | Status: DC | PRN
  Administered 2021-06-21: 4 mg via INTRAVENOUS

## 2021-06-21 MED ORDER — LACTATED RINGERS IV SOLN: INTRAVENOUS | Status: DC

## 2021-06-21 MED ORDER — LIDOCAINE 2% (20 MG/ML) 5 ML SYRINGE
INTRAMUSCULAR | Status: DC | PRN
  Administered 2021-06-21: 50 mg via INTRAVENOUS

## 2021-06-21 SURGICAL SUPPLY — 15 items

## 2021-06-21 NOTE — Anesthesia Procedure Notes (Signed)
Procedure Name: Intubation Date/Time: 06/21/2021 12:14 PM Performed by: Milford Cage, CRNA Pre-anesthesia Checklist: Patient identified, Emergency Drugs available, Suction available and Patient being monitored Patient Re-evaluated:Patient Re-evaluated prior to induction Oxygen Delivery Method: Circle system utilized Preoxygenation: Pre-oxygenation with 100% oxygen Induction Type: IV induction Ventilation: Mask ventilation without difficulty Laryngoscope Size: Miller and 2 Grade View: Grade II Tube type: Oral Tube size: 7.0 mm Number of attempts: 1 Airway Equipment and Method: Stylet Placement Confirmation: ETT inserted through vocal cords under direct vision, positive ETCO2 and breath sounds checked- equal and bilateral Secured at: 19 cm Tube secured with: Tape Dental Injury: Teeth and Oropharynx as per pre-operative assessment

## 2021-06-21 NOTE — Progress Notes (Addendum)
Brief GI progress note  I have evaluated the patient in recovery. She is experiencing some pain with swallowing. The patient has no evidence of crepitus on exam. She is hemodynamically stable otherwise. I expect some discomfort post dilation. We will give her 12.5 mg of fentanyl IV and monitor her pain status. She will also get a liquid lidocaine to swish and spit. We will see how she does with this. If she still having significant discomfort after this in the next 30 to 40 minutes, she will need a neck x-ray and chest x-ray to evaluate for any evidence of mediastinal air to ensure no evidence of a perforation. If she is still having issues post dilation, we will need to likely bring her into the hospital for observation and undergo a water-soluble contrast study versus a CT neck.  The patient's husband has been brought up-to-date with our plan of action.   Justice Britain, MD [facility] Office # [phone]   Afternoon addendum  Neck x-ray and chest x-ray unremarkable.  Patient feeling better upon reevaluation.  She feels back to her baseline.  She knows as does her husband, but thinks to be on the look out for if issues arise.  I will give a 3-day course of ciprofloxacin that she can crush the tabs and give via G-tube.  I will have our team reach out to the patient in the next 24 hours to see how she is done and also to work on trying to get another endoscopy slot with Dr. Carlean Purl or myself.  Patient was appreciative for all the care she has received today.  Justice Britain, MD [facility] Office # [phone]

## 2021-06-21 NOTE — H&P (Signed)
GASTROENTEROLOGY PROCEDURE H&P NOTE   Primary Care Physician: [name], MD  HPI: [name] is a 81 y.o. female who presents for EGD to further evaluate previous esophageal stricture, consistent with prior SCC (status post chemo XRT).  No prior EGD was able to traverse this area.  Previous biopsy was done by ENT.  Review of [facility] ENT notes suggest that they do not see any overt mass or lesion just a tight esophagus stricture.  Concern based on imaging is whether she still has active disease or if this is post chemo XRT stricturing.  Possible PEG pull with retrograde evaluation (awaiting surgical discussion whether this may be performed).  Past Medical History:  Diagnosis Date   Abdominal pain 05/29/2013   s/p rx of cephalo resistant e coli   but last rx NG  now residular ?  bladder sx repeat cx sx rx to ty and uro consult    ADJ DISORDER WITH MIXED ANXIETY & DEPRESSED MOOD 03/03/2010   Qualifier: Diagnosis of  By: [name], [name]    Agent resistant to multiple antibiotics 05/29/2013   e coli   bu NG on fu.     Anemia    Anxiety    ARF (acute renal failure) (HCC) 03/12/2015   Closed head injury 02/01/2011   from syncope and had scalp laceration  neg ct .     Closed head injury 5-6 yrs ago   Colitis 76/28/3151   Complication of anesthesia    migraine several hours after general anesthesia   Depression    esophageal ca 01/2021   Fatty liver    Gall stones 2016   see ct scan neg HIDA    GERD (gastroesophageal reflux disease)    Hearing aid worn    HOH (hard of hearing)    both ears   Hyperlipidemia    Hypertension    echo nl lv function  mild dilitation 2009   Kidney infection    few yrs ago in hospital   Medication side effect 09/02/2010   Poss muscle se of 10 crestor    Migraine    hypnic HA eval by Dr. 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    Pyelonephritis 03/12/2015   Sensation of pain in anesthetized  distribution of trigeminal nerve    Syncope 02/01/2011   In shower on vacation  sustained head laceration  8 sutures Had ed visit neg head ct labs and x ray    Trigeminal neuralgia pain    Past Surgical History:  Procedure Laterality Date   ABDOMINAL HYSTERECTOMY  2002   tubal   BACK SURGERY     2 times, for sciatic nerve pain   CARDIAC CATHETERIZATION  2000   chest pains neg   CHOLECYSTECTOMY N/A 02/21/2017   Procedure: LAPAROSCOPIC CHOLECYSTECTOMY WITH INTRAOPERATIVE CHOLANGIOGRAM;  Surgeon: Armandina Gemma, MD;  Location: [facility];  Service: General;  Laterality: N/A;   COLONOSCOPY     multiple   CRANIOTOMY  12/09/2011   nerve decompression right trigeminal    DIRECT LARYNGOSCOPY N/A 02/12/2021   Procedure: DIRECT LARYNGOSCOPY AND BIOPSY POSSIBLE FROZEN;  Surgeon: [name], MD;  Location: [facility];  Service: ENT;  Laterality: N/A;   DOPPLER ECHOCARDIOGRAPHY  2009   nl lv function mild lv dilitation   EYE SURGERY Bilateral    ioc for catatracts   GASTROSTOMY N/A 02/26/2021   Procedure:  OPEN GASTROSTOMY TUBE PLACEMENT;  Surgeon: Armandina Gemma, MD;  Location: [facility];  Service: General;  Laterality: N/A;   IR GASTROSTOMY TUBE MOD SED  02/22/2021   laparoscopic gallbladder surgery  02/16/2017   Fax from [place] Bilateral 2002   rt shoulder surgery     Current Facility-Administered Medications  Medication Dose Route Frequency Provider Last Rate Last Admin   0.9 %  sodium chloride infusion   Intravenous Continuous Mansouraty, Telford Nab., MD        Current Facility-Administered Medications:    0.9 %  sodium chloride infusion, , Intravenous, Continuous, Mansouraty, Telford Nab., MD Allergies  Allergen Reactions   Hydrocodone Other (See Comments)    Rebound headaches   Sulfamethoxazole-Trimethoprim Hives and Itching    REACTION: unspecified   Family History  Problem Relation Age of Onset   Cancer Mother        uterine    Ovarian cancer Mother    Stroke Mother    Alcohol abuse Father    Stroke Father    Diabetes Brother    Cancer Paternal Aunt        leukemia, unknown type   Seizures Daughter    Hypertension Other    Colon cancer Neg Hx    Dementia Neg Hx    Alzheimer's disease Neg Hx    Social History   Socioeconomic History   Marital status: Married    Spouse name: Not on file   Number of children: 2   Years of education: Not on file   Highest education level: Not on file  Occupational History    Comment: retired Forensic psychologist  Tobacco Use   Smoking status: Never   Smokeless tobacco: Never  Vaping Use   Vaping Use: Never used  Substance and Sexual Activity   Alcohol use: Not Currently    Alcohol/week: 2.0 standard drinks    Types: 2 Glasses of wine per week    Comment: occ wine   Drug use: No   Sexual activity: Not on file  Other Topics Concern   Not on file  Social History Narrative   Married   HH of 2-3 (god daughter)   Pets 2 dogs   Non smoker    Child is a Engineer, drilling   G2P2      Caffeine: 2 cups/day   Social Determinants of Radio broadcast assistant Strain: Low Risk    Difficulty of Paying Living Expenses: Not hard at all  Food Insecurity: No Food Insecurity   Worried About Charity fundraiser in the Last Year: Never true   Ran Out of Food in the Last Year: Never true  Transportation Needs: No Transportation Needs   Lack of Transportation (Medical): No   Lack of Transportation (Non-Medical): No  Physical Activity: Sufficiently Active   Days of Exercise per Week: 2 days   Minutes of Exercise per Session: 150+ min  Stress: No Stress Concern Present   Feeling of Stress : Not at all  Social Connections: Not on file  Intimate Partner Violence: Not on file    Physical Exam: There were no vitals filed for this visit. There is no height or weight on file to calculate BMI. GEN: NAD EYE: Sclerae anicteric ENT: MMM CV: Non-tachycardic GI: Soft, NT/ND NEURO:   Alert & Oriented x 3  Lab Results: Recent Labs    06/18/21 1150  WBC 4.2  HGB 13.2  HCT 39.1  PLT 155  BMET Recent Labs    06/18/21 1150  NA 131*  K 4.4  CL 96*  CO2 31  GLUCOSE 206*  BUN 23  CREATININE 0.63  CALCIUM 9.3   LFT Recent Labs    06/18/21 1150  PROT 6.2*  ALBUMIN 3.5  AST 20  ALT 17  ALKPHOS 76  BILITOT 0.6   PT/INR No results for input(s): LABPROT, INR in the last 72 hours.   Impression / Plan: This is a 81 y.o.female who presents for EGD to further evaluate previous esophageal stricture, consistent with prior SCC (status post chemo XRT).  No prior EGD was able to traverse this area.  Previous biopsy was done by ENT.  Review of [facility] ENT notes suggest that they do not see any overt mass or lesion just a tight esophagus stricture.  Concern based on imaging is whether she still has active disease or if this is post chemo XRT stricturing.  Possible PEG pull with retrograde evaluation (awaiting surgical discussion whether this may be performed).  This is a higher than normal risk procedure as a result of her previous significant esophageal stricturing and cancer.  Risk of perforation increased by [place] pursuing procedure.  This was discussed in great detail with the patient and her primary gastroenterologist last week and again today.  The risks and benefits of endoscopic evaluation/treatment were discussed with the patient and/or family; these include but are not limited to the risk of perforation, infection, bleeding, missed lesions, lack of diagnosis, severe illness requiring hospitalization, as well as anesthesia and sedation related illnesses.  The patient's history has been reviewed, patient examined, no change in status, and deemed stable for procedure.  The patient and/or family is agreeable to proceed.    Justice Britain, MD [facility] Office # [phone]

## 2021-06-21 NOTE — Progress Notes (Signed)
Pt given 12.5 mcg fentanyl for throat pain 7/10.  Spouse notified of this and that we need to keep pt a bit longer to monitor her since we did give pain medication.

## 2021-06-21 NOTE — Op Note (Signed)
[facility] °Patient Name: [name] °Procedure Date: 06/21/2021 °MRN: [id number] °Attending MD:  Mansouraty , MD °Date of Birth: 12/01/1940 °CSN: [id number] °Age: 80 °Admit Type: Outpatient °Procedure:                Upper GI endoscopy °Indications:              Dysphagia, Abnormal CT of the GI tract, Personal  °                          history of malignant esophageal neoplasm °Providers:                 Mansouraty, MD, Lisa Nunn, RN, Erik  °                          [place] Technician °Referring MD:             Yan Feng, Carl E. Gessner, MD, Todd M. Gerkin,  °                          Wanda [name], MD °Medicines:                General Anesthesia °Complications:            No immediate complications. °Estimated Blood Loss:     Estimated blood loss was minimal. °Procedure:                Pre-Anesthesia Assessment: °                          - Prior to the procedure, a History and Physical  °                          was performed, and patient medications and  °                          allergies were reviewed. The patient's tolerance of  °                          previous anesthesia was also reviewed. The risks  °                          and benefits of the procedure and the sedation  °                          options and risks were discussed with the patient.  °                          All questions were answered, and informed consent  °                          was obtained. Prior Anticoagulants: The patient has  °                          taken no previous anticoagulant or antiplatelet  °                            agents. ASA Grade Assessment: III - A patient with                            severe systemic disease. After reviewing the risks                            and benefits, the patient was deemed in                            satisfactory condition to undergo the procedure.                           After obtaining informed consent, the endoscope was                             passed under direct vision. Throughout the                            procedure, the patient's blood pressure, pulse, and                            oxygen saturations were monitored continuously. The                            GIF-[id number] ([id number]) Olympus slim endoscope was                            introduced through the mouth, and advanced to the                            second part of duodenum. The upper GI endoscopy was                            technically difficult and complex due to stricture.                            Successful completion of the procedure was aided by                            performing the maneuvers documented (below) in this                            report. The patient tolerated the procedure. The                            GIF-[id number] ([id number]) Olympus endoscope was introduced                            through the mouth, and advanced to the upper third                            of esophagus. The [id number] ([id number]) Olympus  therapeutic endoscope was introduced through the                            mouth, and advanced to the upper third of esophagus. Scope In: Scope Out: Findings:      Localized severe mucosal changes characterized by congestion,       granularity, sloughing, altered texture and ulceration were found in the       very proximal esophagus. Biopsies were taken with a cold forceps for       histology. This area cannot be traversed with any endoscope initially.      One intrinsic severe (stenosis; an endoscope cannot pass) stenosis was       found 12 to 21 cm from the incisors. This stenosis measured 2 mm (inner       diameter) x 9 cm (in length). The stenosis was only traversed after       dilation and use of the esophageal. Placement of a long 0.035 inch Soft       Antonietta Breach was attempted. This passed successfully. A guidewire was placed       under fluoroscopic guidance and the scope was withdrawn.  Dilation was       performed with a Savary dilator with moderate resistance at 5 mm and 6       mm. The dilation site was examined following endoscope reinsertion and       showed moderate mucosal disruption, moderate improvement in luminal       narrowing and no perforation.      No gross lesions were noted in the distal esophagus.      The Z-line was regular and was found 42 cm from the incisors.      There was evidence of a dislodged gastrostomy tube present on the       anterior wall of the stomach. This was characterized by healthy       appearing mucosa and visible sutures. I adjusted the PEG tub to 3.5 cm       with good effect.      Patchy moderate inflammation characterized by erosions, friability and       granularity was found in the entire examined stomach. Biopsies were       taken with a cold forceps for histology and Helicobacter pylori testing.      No gross lesions were noted in the duodenal bulb, in the first portion       of the duodenum and in the second portion of the duodenum. Impression:               - Congested, granular, texture changed, ulcerated                            mucosa in the very proximal esophagus. Biopsied to                            rule out malignant SCC recurrence.                           - Upper esophageal stenosis - dilated.                           - No gross lesions in esophagus distally.                           -  Z-line regular, 42 cm from the incisors.                           - Dislodged gastrostomy tube present characterized                            by healthy appearing mucosa and visible sutures. I                            adjusted her PEG tube to 3.5 cm.                           - Gastritis. Biopsied.                           - No gross lesions in the duodenal bulb, in the                            first portion of the duodenum and in the second                            portion of the duodenum. Moderate Sedation:       Not Applicable - Patient had care per Anesthesia. Recommendation:           - The patient will be observed post-procedure,                            until all discharge criteria are met.                           - Discharge patient to home.                           - Patient has a contact number available for                            emergencies. The signs and symptoms of potential                            delayed complications were discussed with the                            patient. Return to normal activities tomorrow.                            Written discharge instructions were provided to the                            patient.                           - NPO.                           - Continue present medications.                           -  Await pathology results. °                          - Repeat upper endoscopy in 3 weeks for retreatment  °                          with CG or myself pending availability. °                          - The findings and recommendations were discussed  °                          with the patient. °                          - The findings and recommendations were discussed  °                          with the patient's family. °Procedure Code(s):        --- Professional --- °                          43248, Esophagogastroduodenoscopy, flexible,  °                          transoral; with insertion of guide wire followed by  °                          passage of dilator(s) through esophagus over guide  °                          wire °Diagnosis Code(s):        --- Professional --- °                          K22.8, Other specified diseases of esophagus °                          K22.10, Ulcer of esophagus without bleeding °                          K22.2, Esophageal obstruction °                          K94.23, Gastrostomy malfunction °                          K29.70, Gastritis, unspecified, without bleeding °                          R13.10,  Dysphagia, unspecified °                          Z85.01, Personal history of malignant neoplasm of  °                          esophagus °                            R93.3, Abnormal findings on diagnostic imaging of  °                          other parts of digestive tract °CPT copyright 2019 [employer]. All rights reserved. °The codes documented in this report are preliminary and upon coder review may  °be revised to meet current compliance requirements. ° Mansouraty, MD °06/21/2021 1:42:34 PM °Number of Addenda: 0 °

## 2021-06-21 NOTE — Transfer of Care (Signed)
Immediate Anesthesia Transfer of Care Note  Patient: [name]  Procedure(s) Performed: ESOPHAGOGASTRODUODENOSCOPY (EGD) WITH PROPOFOL SAVORY DILATION DILATION BALLOON used for esophageal stricture  Patient Location: PACU and Endoscopy Unit  Anesthesia Type:General  Level of Consciousness: drowsy and patient cooperative  Airway & Oxygen Therapy: Patient Spontanous Breathing  Post-op Assessment: Report given to RN and Post -op Vital signs reviewed and stable  Post vital signs: Reviewed and stable  Last Vitals:  Vitals Value Taken Time  BP    Temp    Pulse    Resp    SpO2      Last Pain:  Vitals:   06/21/21 1147  TempSrc: Temporal  PainSc: 0-No pain         Complications: No notable events documented.

## 2021-06-21 NOTE — H&P (View-Only) (Signed)
GASTROENTEROLOGY PROCEDURE H&P NOTE   Primary Care Physician: [name], MD  HPI: [name] is a 81 y.o. female who presents for EGD to further evaluate previous esophageal stricture, consistent with prior SCC (status post chemo XRT).  No prior EGD was able to traverse this area.  Previous biopsy was done by ENT.  Review of [facility] ENT notes suggest that they do not see any overt mass or lesion just a tight esophagus stricture.  Concern based on imaging is whether she still has active disease or if this is post chemo XRT stricturing.  Possible PEG pull with retrograde evaluation (awaiting surgical discussion whether this may be performed).  Past Medical History:  Diagnosis Date   Abdominal pain 05/29/2013   s/p rx of cephalo resistant e coli   but last rx NG  now residular ?  bladder sx repeat cx sx rx to ty and uro consult    ADJ DISORDER WITH MIXED ANXIETY & DEPRESSED MOOD 03/03/2010   Qualifier: Diagnosis of  By: [name], [name]    Agent resistant to multiple antibiotics 05/29/2013   e coli   bu NG on fu.     Anemia    Anxiety    ARF (acute renal failure) (HCC) 03/12/2015   Closed head injury 02/01/2011   from syncope and had scalp laceration  neg ct .     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    Pyelonephritis 03/12/2015   Sensation of pain in anesthetized  distribution of trigeminal nerve    Syncope 02/01/2011   In shower on vacation  sustained head laceration  8 sutures Had ed visit neg head ct labs and x ray    Trigeminal neuralgia pain    Past Surgical History:  Procedure Laterality Date   ABDOMINAL HYSTERECTOMY  2002   tubal   BACK SURGERY     2 times, for sciatic nerve pain   CARDIAC CATHETERIZATION  2000   chest pains neg   CHOLECYSTECTOMY N/A 02/21/2017   Procedure: LAPAROSCOPIC CHOLECYSTECTOMY WITH INTRAOPERATIVE CHOLANGIOGRAM;  Surgeon: Armandina Gemma, MD;  Location: [facility];  Service: General;  Laterality: N/A;   COLONOSCOPY     multiple   CRANIOTOMY  12/09/2011   nerve decompression right trigeminal    DIRECT LARYNGOSCOPY N/A 02/12/2021   Procedure: DIRECT LARYNGOSCOPY AND BIOPSY POSSIBLE FROZEN;  Surgeon: [name], MD;  Location: [facility];  Service: ENT;  Laterality: N/A;   DOPPLER ECHOCARDIOGRAPHY  2009   nl lv function mild lv dilitation   EYE SURGERY Bilateral    ioc for catatracts   GASTROSTOMY N/A 02/26/2021   Procedure:  OPEN GASTROSTOMY TUBE PLACEMENT;  Surgeon: Armandina Gemma, MD;  Location: [facility];  Service: General;  Laterality: N/A;   IR GASTROSTOMY TUBE MOD SED  02/22/2021   laparoscopic gallbladder surgery  02/16/2017   Fax from [place] Bilateral 2002   rt shoulder surgery     Current Facility-Administered Medications  Medication Dose Route Frequency Provider Last Rate Last Admin   0.9 %  sodium chloride infusion   Intravenous Continuous Mansouraty, Telford Nab., MD        Current Facility-Administered Medications:    0.9 %  sodium chloride infusion, , Intravenous, Continuous, Mansouraty, Telford Nab., MD Allergies  Allergen Reactions   Hydrocodone Other (See Comments)    Rebound headaches   Sulfamethoxazole-Trimethoprim Hives and Itching    REACTION: unspecified   Family History  Problem Relation Age of Onset   Cancer Mother        uterine    Ovarian cancer Mother    Stroke Mother    Alcohol abuse Father    Stroke Father    Diabetes Brother    Cancer Paternal Aunt        leukemia, unknown type   Seizures Daughter    Hypertension Other    Colon cancer Neg Hx    Dementia Neg Hx    Alzheimer's disease Neg Hx    Social History   Socioeconomic History   Marital status: Married    Spouse name: Not on file   Number of children: 2   Years of education: Not on file   Highest education level: Not on file  Occupational History    Comment: retired Forensic psychologist  Tobacco Use   Smoking status: Never   Smokeless tobacco: Never  Vaping Use   Vaping Use: Never used  Substance and Sexual Activity   Alcohol use: Not Currently    Alcohol/week: 2.0 standard drinks    Types: 2 Glasses of wine per week    Comment: occ wine   Drug use: No   Sexual activity: Not on file  Other Topics Concern   Not on file  Social History Narrative   Married   HH of 2-3 (god daughter)   Pets 2 dogs   Non smoker    Child is a Engineer, drilling   G2P2      Caffeine: 2 cups/day   Social Determinants of Radio broadcast assistant Strain: Low Risk    Difficulty of Paying Living Expenses: Not hard at all  Food Insecurity: No Food Insecurity   Worried About Charity fundraiser in the Last Year: Never true   Ran Out of Food in the Last Year: Never true  Transportation Needs: No Transportation Needs   Lack of Transportation (Medical): No   Lack of Transportation (Non-Medical): No  Physical Activity: Sufficiently Active   Days of Exercise per Week: 2 days   Minutes of Exercise per Session: 150+ min  Stress: No Stress Concern Present   Feeling of Stress : Not at all  Social Connections: Not on file  Intimate Partner Violence: Not on file    Physical Exam: There were no vitals filed for this visit. There is no height or weight on file to calculate BMI. GEN: NAD EYE: Sclerae anicteric ENT: MMM CV: Non-tachycardic GI: Soft, NT/ND NEURO:   Alert & Oriented x 3  Lab Results: Recent Labs    06/18/21 1150  WBC 4.2  HGB 13.2  HCT 39.1  PLT 155  BMET Recent Labs    06/18/21 1150  NA 131*  K 4.4  CL 96*  CO2 31  GLUCOSE 206*  BUN 23  CREATININE 0.63  CALCIUM 9.3   LFT Recent Labs    06/18/21 1150  PROT 6.2*  ALBUMIN 3.5  AST 20  ALT 17  ALKPHOS 76  BILITOT 0.6   PT/INR No results for input(s): LABPROT, INR in the last 72 hours.   Impression / Plan: This is a 81 y.o.female who presents for EGD to further evaluate previous esophageal stricture, consistent with prior SCC (status post chemo XRT).  No prior EGD was able to traverse this area.  Previous biopsy was done by ENT.  Review of [facility] ENT notes suggest that they do not see any overt mass or lesion just a tight esophagus stricture.  Concern based on imaging is whether she still has active disease or if this is post chemo XRT stricturing.  Possible PEG pull with retrograde evaluation (awaiting surgical discussion whether this may be performed).  This is a higher than normal risk procedure as a result of her previous significant esophageal stricturing and cancer.  Risk of perforation increased by [place] pursuing procedure.  This was discussed in great detail with the patient and her primary gastroenterologist last week and again today.  The risks and benefits of endoscopic evaluation/treatment were discussed with the patient and/or family; these include but are not limited to the risk of perforation, infection, bleeding, missed lesions, lack of diagnosis, severe illness requiring hospitalization, as well as anesthesia and sedation related illnesses.  The patient's history has been reviewed, patient examined, no change in status, and deemed stable for procedure.  The patient and/or family is agreeable to proceed.    Justice Britain, MD [facility] Office # [phone]

## 2021-06-21 NOTE — Discharge Instructions (Signed)
YOU HAD AN ENDOSCOPIC PROCEDURE TODAY: Refer to the procedure report and other information in the discharge instructions given to you for any specific questions about what was found during the examination. If this information does not answer your questions, please call [place] office at [phone] to clarify.   YOU SHOULD EXPECT: Some feelings of bloating in the abdomen. Passage of more gas than usual. Walking can help get rid of the air that was put into your GI tract during the procedure and reduce the bloating. If you had a lower endoscopy (such as a colonoscopy or flexible sigmoidoscopy) you may notice spotting of blood in your stool or on the toilet paper. Some abdominal soreness may be present for a day or two, also.  DIET: Your first meal following the procedure should be a light meal and then it is ok to progress to your normal diet. A half-sandwich or bowl of soup is an example of a good first meal. Heavy or fried foods are harder to digest and may make you feel nauseous or bloated. Drink plenty of fluids but you should avoid alcoholic beverages for 24 hours. If you had a esophageal dilation, please see attached instructions for diet.    ACTIVITY: Your care partner should take you home directly after the procedure. You should plan to take it easy, moving slowly for the rest of the day. You can resume normal activity the day after the procedure however YOU SHOULD NOT DRIVE, use power tools, machinery or perform tasks that involve climbing or major physical exertion for 24 hours (because of the sedation medicines used during the test).   SYMPTOMS TO REPORT IMMEDIATELY: A gastroenterologist can be reached at any hour. Please call [phone]  for any of the following symptoms:   Following upper endoscopy (EGD, EUS, ERCP, esophageal dilation) Vomiting of blood or coffee ground material  New, significant abdominal pain  New, significant chest pain or pain under the shoulder blades  Painful or  persistently difficult swallowing  New shortness of breath  Black, tarry-looking or red, bloody stools  FOLLOW UP:  If any biopsies were taken you will be contacted by phone or by letter within the next 1-3 weeks. Call [phone]  if you have not heard about the biopsies in 3 weeks.  Please also call with any specific questions about appointments or follow up tests.  

## 2021-06-22 ENCOUNTER — Telehealth: Payer: Self-pay | Admitting: Hematology

## 2021-06-22 ENCOUNTER — Encounter: Payer: Self-pay | Admitting: Gastroenterology

## 2021-06-22 ENCOUNTER — Encounter (HOSPITAL_COMMUNITY): Payer: Self-pay | Admitting: Gastroenterology

## 2021-06-22 LAB — SURGICAL PATHOLOGY

## 2021-06-22 NOTE — Anesthesia Postprocedure Evaluation (Addendum)
Anesthesia Post Note  Patient: [name]  Procedure(s) Performed: ESOPHAGOGASTRODUODENOSCOPY (EGD) WITH PROPOFOL SAVORY DILATION DILATION BALLOON used for esophageal stricture     Patient location during evaluation: PACU Anesthesia Type: General Level of consciousness: awake and alert Pain management: pain level controlled Vital Signs Assessment: post-procedure vital signs reviewed and stable Respiratory status: spontaneous breathing, nonlabored ventilation and respiratory function stable Cardiovascular status: blood pressure returned to baseline and stable Postop Assessment: no apparent nausea or vomiting Anesthetic complications: no Comments: GA per Dr. Rush Landmark, expected difficult procedure 2/2 radiation   No notable events documented.  Last Vitals:  Vitals:   06/21/21 1420 06/21/21 1517  BP:  (!) 164/79  Pulse: 60 (!) 59  Resp: 20 16  Temp:    SpO2: 96% 96%    Last Pain:  Vitals:   06/21/21 1517  TempSrc:   PainSc: 0-No pain                 Pervis Hocking

## 2021-06-22 NOTE — Telephone Encounter (Signed)
Scheduled follow-up appointment per 1/27 los. Patient is aware.

## 2021-06-23 ENCOUNTER — Other Ambulatory Visit: Payer: Self-pay

## 2021-06-23 DIAGNOSIS — K222 Esophageal obstruction: Secondary | ICD-10-CM

## 2021-06-23 DIAGNOSIS — R1310 Dysphagia, unspecified: Secondary | ICD-10-CM | POA: Diagnosis not present

## 2021-06-23 DIAGNOSIS — K2289 Other specified disease of esophagus: Secondary | ICD-10-CM

## 2021-06-23 DIAGNOSIS — C159 Malignant neoplasm of esophagus, unspecified: Secondary | ICD-10-CM | POA: Diagnosis not present

## 2021-06-24 ENCOUNTER — Other Ambulatory Visit: Payer: Self-pay

## 2021-06-24 ENCOUNTER — Telehealth: Payer: Self-pay | Admitting: Internal Medicine

## 2021-06-24 DIAGNOSIS — K222 Esophageal obstruction: Secondary | ICD-10-CM

## 2021-06-24 DIAGNOSIS — R1310 Dysphagia, unspecified: Secondary | ICD-10-CM

## 2021-06-24 DIAGNOSIS — K2289 Other specified disease of esophagus: Secondary | ICD-10-CM

## 2021-06-24 NOTE — Telephone Encounter (Signed)
Swallowing saliva Able to drink a bit  I explained I would try to find an earlier appt to repeat dilation when I return from vacation.

## 2021-06-25 ENCOUNTER — Other Ambulatory Visit: Payer: Self-pay | Admitting: Internal Medicine

## 2021-06-28 ENCOUNTER — Other Ambulatory Visit: Payer: Self-pay | Admitting: Hematology

## 2021-06-29 ENCOUNTER — Encounter: Payer: Self-pay | Admitting: Hematology

## 2021-06-29 ENCOUNTER — Encounter: Payer: Self-pay | Admitting: Nurse Practitioner

## 2021-07-02 ENCOUNTER — Inpatient Hospital Stay: Payer: Medicare Other | Admitting: Dietician

## 2021-07-02 ENCOUNTER — Inpatient Hospital Stay: Payer: Medicare Other | Admitting: Hematology

## 2021-07-02 ENCOUNTER — Other Ambulatory Visit: Payer: Self-pay

## 2021-07-02 ENCOUNTER — Inpatient Hospital Stay: Payer: Medicare Other

## 2021-07-02 ENCOUNTER — Inpatient Hospital Stay: Payer: Medicare Other | Attending: Nurse Practitioner

## 2021-07-02 ENCOUNTER — Encounter: Payer: Self-pay | Admitting: Hematology

## 2021-07-02 ENCOUNTER — Other Ambulatory Visit: Payer: Self-pay | Admitting: Hematology

## 2021-07-02 VITALS — BP 144/74 | HR 61 | Temp 98.4°F | Resp 17 | Wt 129.2 lb

## 2021-07-02 DIAGNOSIS — C153 Malignant neoplasm of upper third of esophagus: Secondary | ICD-10-CM

## 2021-07-02 DIAGNOSIS — Z5112 Encounter for antineoplastic immunotherapy: Secondary | ICD-10-CM | POA: Diagnosis not present

## 2021-07-02 DIAGNOSIS — Z79899 Other long term (current) drug therapy: Secondary | ICD-10-CM | POA: Diagnosis not present

## 2021-07-02 LAB — CMP (CANCER CENTER ONLY)
ALT: 15 U/L (ref 0–44)
AST: 18 U/L (ref 15–41)
Albumin: 3.7 g/dL (ref 3.5–5.0)
Alkaline Phosphatase: 79 U/L (ref 38–126)
Anion gap: 4 — ABNORMAL LOW (ref 5–15)
BUN: 25 mg/dL — ABNORMAL HIGH (ref 8–23)
CO2: 31 mmol/L (ref 22–32)
Calcium: 9.5 mg/dL (ref 8.9–10.3)
Chloride: 99 mmol/L (ref 98–111)
Creatinine: 0.68 mg/dL (ref 0.44–1.00)
GFR, Estimated: >60 mL/min (ref >60)
Glucose, Bld: 112 mg/dL — ABNORMAL HIGH (ref 70–99)
Potassium: 4.4 mmol/L (ref 3.5–5.1)
Sodium: 134 mmol/L — ABNORMAL LOW (ref 135–145)
Total Bilirubin: 0.5 mg/dL (ref 0.3–1.2)
Total Protein: 6.3 g/dL — ABNORMAL LOW (ref 6.5–8.1)

## 2021-07-02 LAB — CBC WITH DIFFERENTIAL (CANCER CENTER ONLY)
Abs Immature Granulocytes: 0.01 10*3/uL (ref 0.00–0.07)
Basophils Absolute: 0 10*3/uL (ref 0.0–0.1)
Basophils Relative: 1 %
Eosinophils Absolute: 0.1 10*3/uL (ref 0.0–0.5)
Eosinophils Relative: 1 %
HCT: 39.9 % (ref 36.0–46.0)
Hemoglobin: 13 g/dL (ref 12.0–15.0)
Immature Granulocytes: 0 %
Lymphocytes Relative: 12 %
Lymphs Abs: 0.5 10*3/uL — ABNORMAL LOW (ref 0.7–4.0)
MCH: 30.1 pg (ref 26.0–34.0)
MCHC: 32.6 g/dL (ref 30.0–36.0)
MCV: 92.4 fL (ref 80.0–100.0)
Monocytes Absolute: 0.5 10*3/uL (ref 0.1–1.0)
Monocytes Relative: 11 %
Neutro Abs: 3.2 10*3/uL (ref 1.7–7.7)
Neutrophils Relative %: 75 %
Platelet Count: 174 10*3/uL (ref 150–400)
RBC: 4.32 MIL/uL (ref 3.87–5.11)
RDW: 13.2 % (ref 11.5–15.5)
WBC Count: 4.3 10*3/uL (ref 4.0–10.5)
nRBC: 0 % (ref 0.0–0.2)

## 2021-07-02 MED ORDER — PANTOPRAZOLE SODIUM 20 MG PO TBEC: 20.0000 mg | DELAYED_RELEASE_TABLET | Freq: Every day | ORAL | 0 refills | Status: DC

## 2021-07-02 MED ORDER — SODIUM CHLORIDE 0.9 % IV SOLN
240.0000 mg | Freq: Once | INTRAVENOUS | Status: AC
  Administered 2021-07-02: 240 mg via INTRAVENOUS
  Filled 2021-07-02: qty 24

## 2021-07-02 MED ORDER — SODIUM CHLORIDE 0.9 % IV SOLN: Freq: Once | INTRAVENOUS | Status: AC

## 2021-07-02 NOTE — Patient Instructions (Signed)
[name] ONCOLOGY  Discharge Instructions: Thank you for choosing [facility] to provide your oncology and hematology care.   If you have a lab appointment with the [facility], please go directly to the [facility] and check in at the registration area.   Wear comfortable clothing and clothing appropriate for easy access to any Portacath or PICC line.   We strive to give you quality time with your provider. You may need to reschedule your appointment if you arrive late (15 or more minutes).  Arriving late affects you and other patients whose appointments are after yours.  Also, if you miss three or more appointments without notifying the office, you may be dismissed from the clinic at the providers discretion.      For prescription refill requests, have your pharmacy contact our office and allow 72 hours for refills to be completed.    Today you received the following chemotherapy and/or immunotherapy agents: Opdivo.      To help prevent nausea and vomiting after your treatment, we encourage you to take your nausea medication as directed.  BELOW ARE SYMPTOMS THAT SHOULD BE REPORTED IMMEDIATELY: *FEVER GREATER THAN 100.4 F (38 C) OR HIGHER *CHILLS OR SWEATING *NAUSEA AND VOMITING THAT IS NOT CONTROLLED WITH YOUR NAUSEA MEDICATION *UNUSUAL SHORTNESS OF BREATH *UNUSUAL BRUISING OR BLEEDING *URINARY PROBLEMS (pain or burning when urinating, or frequent urination) *BOWEL PROBLEMS (unusual diarrhea, constipation, pain near the anus) TENDERNESS IN MOUTH AND THROAT WITH OR WITHOUT PRESENCE OF ULCERS (sore throat, sores in mouth, or a toothache) UNUSUAL RASH, SWELLING OR PAIN  UNUSUAL VAGINAL DISCHARGE OR ITCHING   Items with * indicate a potential emergency and should be followed up as soon as possible or go to the Emergency Department if any problems should occur.  Please show the CHEMOTHERAPY ALERT CARD or IMMUNOTHERAPY ALERT CARD at check-in to  the Emergency Department and triage nurse.  Should you have questions after your visit or need to cancel or reschedule your appointment, please contact [facility]  Dept: [phone]  and follow the prompts.  Office hours are 8:00 a.m. to 4:30 p.m. Monday - Friday. Please note that voicemails left after 4:00 p.m. may not be returned until the following business day.  We are closed weekends and major holidays. You have access to a nurse at all times for urgent questions. Please call the main number to the clinic Dept: [phone] and follow the prompts.   For any non-urgent questions, you may also contact your provider using MyChart. We now offer e-Visits for anyone 74 and older to request care online for non-urgent symptoms. For details visit [email or web address].   Also download the MyChart app! Go to the app store, search "MyChart", open the app, select Central, and log in with your MyChart username and password.  Due to Covid, a mask is required upon entering the hospital/clinic. If you do not have a mask, one will be given to you upon arrival. For doctor visits, patients may have 1 support person aged 44 or older with them. For treatment visits, patients cannot have anyone with them due to current Covid guidelines and our immunocompromised population.

## 2021-07-02 NOTE — Progress Notes (Signed)
Nutrition Follow-up:  Patient completed concurrent chemoradiation for esophageal cancer. She is currently receiving Nivolumab q2 weeks, started 05/21/21.  Patient s/p EGD with dilation by Dr. Rush Landmark on 2/27.  Met with patient during infusion. She reports improved thick saliva, "able to swallow her own spit finally." Patient is taking small sips of fluids. She had ~1/4 cup of coffee with creamer this morning. Patient is tolerating 5 cartons of Nutren 1.5 via PEG with 120 ml water flush before and 60 ml water flush after each feeding. Patient is requesting assistance with feeding today while receiving treatment. Patient denies nausea, vomiting, diarrhea, constipation.   Medications: reviewed   Labs: Na 134, Glucose 112, BUN 25  Anthropometrics: Weight 129 lb 3 oz today increased   1/27 - 126 lb 3.2 oz 1/17 - 127 lb  1/13 - 125 lb 9.6 oz 12/30 - 125 lb 9.6 oz 12/8 - 131 lb    NUTRITION DIAGNOSIS: Inadequate oral intake ongoing, pt relies on tube feeding for nutrition/hydration    INTERVENTION:  Continue Nutren 1.5 - one carton 5x/day via PEG Encouraged oral intake of fluids as tolerated Provided support and encouragement  Provided assistance with afternoon tube feeding - pt tolerated one carton Nutren 1.5 with 120 ml water before and 60 ml water after Extra tube feeding supplies provided  MONITORING, EVALUATION, GOAL: weight trends, intake, tube feeding    NEXT VISIT: Friday February 24 during infusion

## 2021-07-02 NOTE — Progress Notes (Signed)
[name]   Telephone:([phone]) [phone] Fax:(336) [phone]   Clinic Follow up Note   Patient Care Team: [name], [name], MD as PCP - General [name], MD as PCP - Cardiology (Cardiology) Cindie Crumbly (Neurosurgery) Irine Seal, MD as Attending Physician (Urology) Erline Levine, MD as Consulting Physician (Neurosurgery) [name], MD as Consulting Physician (Neurology) Viona Gilmore, [facility] as Pharmacist (Pharmacist) [name], MD as Consulting Physician (Gastroenterology) Carol Ada, MD as Consulting Physician (Gastroenterology) [name], MD (Inactive) as Consulting Physician (Otolaryngology) [name], MD as Consulting Physician (Hematology) Kyung Rudd, MD as Consulting Physician (Radiation Oncology) Kyung Rudd, MD as Consulting Physician (Radiation Oncology)  Date of Service:  07/02/2021  CHIEF COMPLAINT: f/u of esophageal cancer  CURRENT THERAPY:  Nivolumab q2weeks, started 05/21/21  ASSESSMENT & PLAN:  [name] is a 81 y.o. female with   1. Squamous cell carcinoma of cervical esophagus, cTxN2M0 -presented with dysphagia and weight loss. EGD 9/15 and CT neck 02/05/21 showed 6 cm mass in the cervical/upper esophagus with possible local adenopathy. Path confirmed squamous cell carcinoma of the esophagus, PDL1 testing 95% positive -She had feeding tube placed by Dr. Harlow Asa 02/22/21  -PET scan 02/23/21 shows hypermetabolic esophageal mass and hypermetabolic paratracheal nodes, negative for distant metastasis.  -due to the location in the cervical esophagus, Dr. Kipp Brood feels she is not a surgical candidate.  -she received concurrent chemoRT with carbo/taxol 10/17-11/23/22. She required dose reduction with last cycle due to radiation dermatitis, hoarseness, dysphagia, and odynophagia.  -given PDL1+, she began immunotherapy with Nivolumab on 05/21/21. She will receive it every 2 weeks for the first two months,  then move to every 4 weeks if she tolerates well. Will plan to give this for a total of a year if NED. -neck CT 06/18/21 showed decrease in size of mass-like enlargement of cervical esophagus and size of lymph nodes. -EGD on 06/21/21 by Dr. Carlean Purl showed only inflammation. Biopsy was negative for residual cancer. -labs reviewed, overall adequate to proceed with Nivo today.   2. Dysphagia, weight loss, cough -Secondary to #1 -She underwent G-tube placement by Dr. Harlow Asa on 02/22/21 -she is currently taking in nutrition only by feeding tube, 5 times a day. Weight overall stable. -she was seen by Dr. Wilburn Cornelia, who did not see any evidence of esophageal mass. -neck CT 06/18/21 showed decrease in size of esophageal mass and lymph nodes. -she had EGD with Dr. Carlean Purl on 06/21/21 and underwent upper esophageal dilation. She notes she is able to swallow some now. She is scheduled for repeat dilation on 2/27 and 3/16. -she also reports a cough from irritation in the throat. I will prescribe protonix for her to try.   3. 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 Trigeminal neuralgia, sciatica, headaches -On lamotrigene and oxcarbazepine  -pain has worsened before and during treatment -f/up Dr. Jaynee Eagles    5. HTN -continue benazepril, furosemide - I reduced benazepril to once daily from 03/09/21, and let her know to hold lasix if she becomes dizzy at home   -monitoring closely    6. Goals of Care/Code Status -Code status was DNR from hospitalization 05/16/20, changed to full code during 02/26/21 hospitalization for feeding tube placement       PLAN: -proceed with Nivo today and every 2  weeks -I called in protonix, to see if her cough will improve  -chest CT to be done in next 10-12  days -lab, f/u, and Nivo as scheduled in 2 weeks -EGD 2/27 and 3/16 with Dr. Carlean Purl   No problem-specific Assessment & Plan notes found for this encounter.   SUMMARY OF ONCOLOGIC HISTORY: Oncology History  Malignant neoplasm of upper third esophagus ([place])  12/21/2020 Imaging   Laryngoscopy Comments:    On fiberoptic laryngoscopy through the right nostril the nasopharynx was clear.  The base of tongue vallecula and epiglottis were normal.    Piriform sinuses were clear bilaterally and vocal cords were clear with  normal vocal mobility.  No structural abnormalities noted.   01/22/2021 Imaging   DG esophagus IMPRESSION: 1. Luminal narrowing in the high cervical esophagus over a 3 cm segment is highly concerning for esophageal neoplasm. Inflammatory process would be a secondary consideration. Luminal narrowing occurs approximately at the C5-C7 vertebral body level just below the glottis. Patient experienced several episodes of choking related to this luminal narrowing. Recommend expedient upper GI endoscopy for evaluation. 2. Distal thoracic esophagus and GE junction appear normal.   02/04/2021 Procedure   EGD by Dr. Carlean Purl impression- One mass-like severe stenosis was found in the upper third of the esophagus. The stenosis was not traversed.   02/05/2021 Imaging   CT soft tissue neck w contrast IMPRESSION: Mass-like soft tissue thickening and mucosal hyperenhancement of the cervical esophagus and hypopharynx, spanning the C4-T1 levels, likely reflecting an esophageal malignancy. This measures up to 2.4 x 3.4 cm in transaxial dimensions, and 6.3 cm in craniocaudal dimension. Associated severe effacement of the esophageal lumen.   Centrally necrotic right paratracheal lymph node beneath the level of the mass, measuring 1.3 x 0.9 cm, and likely reflecting a site of nodal metastatic disease.   Additional lymph nodes along the posterior and inferior aspect of the right thyroid lobe (along the  right aspect of the esophageal mass), which measure subcentimeter but are asymmetrically prominent and highly suspicious for additional sites of nodal metastatic disease.   02/12/2021 Pathology Results   FINAL MICROSCOPIC DIAGNOSIS:  A. ESOPHAGUS, UPPER CERVICAL #1, BIOPSY:  - Squamous cell carcinoma.  B. ESOPHAGUS, UPPER CERVICAL #2, BIOPSY:  - Squamous cell carcinoma.    02/12/2021 Cancer Staging   Staging form: Esophagus - Squamous Cell Carcinoma, AJCC 8th Edition - Clinical stage from 02/12/2021: Stage Unknown (cTX, cN2, cM0) - Signed by [name], MD on 03/08/2021 Stage prefix: Initial diagnosis    02/18/2021 Initial Diagnosis   Malignant neoplasm of upper third esophagus ([place])   02/23/2021 PET scan   IMPRESSION: Markedly hypermetabolic focal masslike thickening of the cervical esophagus, compatible with primary esophageal malignancy.   Markedly hypermetabolic right upper paratracheal lymph node, compatible with metastatic disease.   Small right upper paratracheal and right level IIA lymph nodes with mild hypermetabolic activity, concerning for additional sites of nodal metastatic disease.   Aortic Atherosclerosis (ICD10-I70.0).   03/09/2021 - 04/13/2021 Chemotherapy   Patient is on Treatment Plan : ESOPHAGUS Carboplatin/PACLitaxel weekly x 6 weeks with XRT       05/21/2021 -  Chemotherapy   Patient is on Treatment Plan : GASTROESOPHAGEAL Nivolumab q14d x 8 cycles / Nivolumab q28d        INTERVAL HISTORY:  [name] is here for a follow up of esophageal cancer. She was last seen by me on 06/18/21. She presents to the clinic accompanied by her husband. She reports she is  now able to swallow about a teaspoon at a time. She also reports cough   All other systems were reviewed with the patient and are negative.  MEDICAL HISTORY:  Past Medical History:  Diagnosis Date   Abdominal pain 05/29/2013   s/p rx of cephalo resistant e coli   but last rx NG  now  residular ?  bladder sx repeat cx sx rx to ty and uro consult    ADJ DISORDER WITH MIXED ANXIETY & DEPRESSED MOOD 03/03/2010   Qualifier: Diagnosis of  By: [name], [name]    Agent resistant to multiple antibiotics 05/29/2013   e coli   bu NG on fu.     Anemia    Anxiety    ARF (acute renal failure) (HCC) 03/12/2015   Closed head injury 02/01/2011   from syncope and had scalp laceration  neg ct .     Closed head injury 5-6 yrs ago   Colitis 23/53/6144   Complication of anesthesia    migraine several hours after general anesthesia   Depression    esophageal ca 01/2021   Fatty liver    Gall stones 2016   see ct scan neg [facility]    GERD (gastroesophageal reflux disease)    Hearing aid worn    HOH (hard of hearing)    both ears   Hyperlipidemia    Hypertension    echo nl lv function  mild dilitation 2009   Kidney infection    few yrs ago in hospital   Medication side effect 09/02/2010   Poss muscle se of 10 crestor    Migraine    hypnic HA eval by Dr. 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    Pyelonephritis 03/12/2015   Sensation of pain in anesthetized distribution of trigeminal nerve    Syncope 02/01/2011   In shower on vacation  sustained head laceration  8 sutures Had ed visit neg head ct labs and x ray    Trigeminal neuralgia pain     SURGICAL HISTORY: Past Surgical History:  Procedure Laterality Date   ABDOMINAL HYSTERECTOMY  2002   tubal   BACK SURGERY     2 times, for sciatic nerve pain   BALLOON DILATION  06/21/2021   Procedure: DILATION BALLOON used for esophageal stricture;  Surgeon: Irving Copas., MD;  Location: [facility];  Service: Gastroenterology;;   CARDIAC CATHETERIZATION  2000   chest pains neg   CHOLECYSTECTOMY N/A 02/21/2017   Procedure: LAPAROSCOPIC CHOLECYSTECTOMY WITH INTRAOPERATIVE CHOLANGIOGRAM;  Surgeon: Armandina Gemma, MD;  Location: [facility];  Service: General;  Laterality: N/A;   COLONOSCOPY      multiple   CRANIOTOMY  12/09/2011   nerve decompression right trigeminal    DIRECT LARYNGOSCOPY N/A 02/12/2021   Procedure: DIRECT LARYNGOSCOPY AND BIOPSY POSSIBLE FROZEN;  Surgeon: [name], MD;  Location: [facility];  Service: ENT;  Laterality: N/A;   DOPPLER ECHOCARDIOGRAPHY  2009   nl lv function mild lv dilitation   ESOPHAGOGASTRODUODENOSCOPY (EGD) WITH PROPOFOL N/A 06/21/2021   Procedure: ESOPHAGOGASTRODUODENOSCOPY (EGD) WITH PROPOFOL;  Surgeon: Irving Copas., MD;  Location: [facility];  Service: Gastroenterology;  Laterality: N/A;  Request Fluoroscopy; Plan in for dilation   EYE SURGERY Bilateral    ioc for catatracts   GASTROSTOMY N/A 02/26/2021   Procedure: OPEN GASTROSTOMY TUBE PLACEMENT;  Surgeon: Armandina Gemma, MD;  Location: [facility];  Service: General;  Laterality:  N/A;   IR GASTROSTOMY TUBE MOD SED  02/22/2021   laparoscopic gallbladder surgery  02/16/2017   Fax from [facility] Bilateral 2002   rt shoulder surgery     SAVORY DILATION N/A 06/21/2021   Procedure: SAVORY DILATION;  Surgeon: Irving Copas., MD;  Location: [facility];  Service: Gastroenterology;  Laterality: N/A;    I have reviewed the social history and family history with the patient and they are unchanged from previous note.  ALLERGIES:  is allergic to hydrocodone and sulfamethoxazole-trimethoprim.  MEDICATIONS:  Current Outpatient Medications  Medication Sig Dispense Refill   pantoprazole (PROTONIX) 20 MG tablet Take 1 tablet (20 mg total) by mouth daily. 30 tablet 0   alendronate (FOSAMAX) 70 MG tablet TAKE 1 TABLET BY MOUTH 1 TIME A WEEK WITH FULL GLASS OF WATER AND ON AN EMPTY STOMACH AS DIRECTED (Patient not taking: Reported on 06/08/2021) 4 tablet 2   butorphanol (STADOL) 10 MG/ML nasal spray Place 1 spray into the nose every 4 (four) hours as needed for headache (trigeminal neuralgia.). 2.5 mL 0   Calcium Carb-Cholecalciferol  (CALCIUM 600+D) 600-800 MG-UNIT TABS Take 1 tablet by mouth 2 (two) times daily.     carvedilol (COREG) 25 MG tablet TAKE 1 TABLET(25 MG) BY MOUTH TWICE DAILY WITH A MEAL 60 tablet 3   conjugated estrogens (PREMARIN) vaginal cream Place 1 applicator vaginally 2 (two) times a week.     fluticasone (FLONASE) 50 MCG/ACT nasal spray Place 2 sprays into both nostrils as needed for allergies or rhinitis. 16 g 5   furosemide (LASIX) 20 MG tablet TAKE 1 TABLET(20 MG) BY MOUTH DAILY 90 tablet 0   guaiFENesin-codeine 100-10 MG/5ML syrup TAKE 5 ML BY MOUTH EVERY 6 HOURS AS NEEDED FOR COUGH 240 mL 0   lamoTRIgine (LAMICTAL) 100 MG tablet Take 100 mg by mouth 2 (two) times daily.     Lisinopril 1 MG/ML SOLN Take 20 mg  daily Per g tube 600 mL 2   Multiple Vitamin (MULTIVITAMIN ADULT PO) Take 1 tablet by mouth daily.     Nutritional Supplements (NUTREN 1.5) LIQD 6 Cans by Enteral route 5 (five) times daily. 1500 mL 6   ondansetron (ZOFRAN-ODT) 4 MG disintegrating tablet Take 1-2 tablets (4-8 mg total) by mouth every 8 (eight) hours as needed for nausea. 60 tablet 3   OXcarbazepine (TRILEPTAL) 300 MG/5ML suspension Take 2.5 mLs (150 mg total) by mouth 2 (two) times daily. 250 mL 3   Polyethyl Glycol-Propyl Glycol (SYSTANE) 0.4-0.3 % GEL ophthalmic gel Place 1 application into both eyes 2 (two) times daily as needed (dry eyes).     prochlorperazine (COMPAZINE) 10 MG tablet TAKE 1 TABLET(10 MG) BY MOUTH EVERY 6 HOURS AS NEEDED FOR NAUSEA OR VOMITING 30 tablet 1   No current facility-administered medications for this visit.    PHYSICAL EXAMINATION: ECOG PERFORMANCE STATUS: 3 - Symptomatic, >50% confined to bed  Vitals:   07/02/21 1351  BP: (!) 144/74  Pulse: 61  Resp: 17  Temp: 98.4 F (36.9 C)  SpO2: 97%   Wt Readings from Last 3 Encounters:  07/02/21 129 lb 3 oz (58.6 kg)  06/21/21 124 lb (56.2 kg)  06/18/21 126 lb 3.2 oz (57.2 kg)    GENERAL:alert, no distress and comfortable SKIN: skin color  normal, no rashes or significant lesions EYES: normal, Conjunctiva are pink and non-injected, sclera clear  NEURO: alert & oriented x 3 with fluent speech  LABORATORY DATA:  I  have reviewed the data as listed CBC Latest Ref Rng & Units 07/02/2021 06/18/2021 06/04/2021  WBC 4.0 - 10.5 K/uL 4.3 4.2 4.8  Hemoglobin 12.0 - 15.0 g/dL 13.0 13.2 13.2  Hematocrit 36.0 - 46.0 % 39.9 39.1 40.1  Platelets 150 - 400 K/uL 174 155 193     CMP Latest Ref Rng & Units 07/02/2021 06/18/2021 06/04/2021  Glucose 70 - 99 mg/dL 112(H) 206(H) 176(H)  BUN 8 - 23 mg/dL 25(H) 23 22  Creatinine 0.44 - 1.00 mg/dL 0.68 0.63 0.70  Sodium 135 - 145 mmol/L 134(L) 131(L) 137  Potassium 3.5 - 5.1 mmol/L 4.4 4.4 4.3  Chloride 98 - 111 mmol/L 99 96(L) 100  CO2 22 - 32 mmol/L 31 31 32  Calcium 8.9 - 10.3 mg/dL 9.5 9.3 9.5  Total Protein 6.5 - 8.1 g/dL 6.3(L) 6.2(L) 6.5  Total Bilirubin 0.3 - 1.2 mg/dL 0.5 0.6 0.6  Alkaline Phos 38 - 126 U/L 79 76 80  AST 15 - 41 U/L 18 20 22   ALT 0 - 44 U/L 15 17 19       RADIOGRAPHIC STUDIES: I have personally reviewed the radiological images as listed and agreed with the findings in the report. No results found.    Orders Placed This Encounter  Procedures   CT Chest W Contrast    Standing Status:   Future    Standing Expiration Date:   07/02/2022    Order Specific Question:   If indicated for the ordered procedure, I authorize the administration of contrast media per Radiology protocol    Answer:   Yes    Order Specific Question:   Preferred imaging location?    Answer:   [facility]   All questions were answered. The patient knows to call the clinic with any problems, questions or concerns. No barriers to learning was detected. The total time spent in the appointment was 30 minutes.     [name], MD 07/02/2021   I, Wilburn Mylar, am acting as scribe for [name], MD.   I have reviewed the above documentation for accuracy and completeness, and I agree with  the above.

## 2021-07-03 LAB — T4: T4, Total: 6.5 ug/dL (ref 4.5–12.0)

## 2021-07-05 LAB — TSH: TSH: 0.728 u[IU]/mL (ref 0.308–3.960)

## 2021-07-08 DIAGNOSIS — Z923 Personal history of irradiation: Secondary | ICD-10-CM | POA: Diagnosis not present

## 2021-07-08 DIAGNOSIS — Z9889 Other specified postprocedural states: Secondary | ICD-10-CM | POA: Diagnosis not present

## 2021-07-08 DIAGNOSIS — C159 Malignant neoplasm of esophagus, unspecified: Secondary | ICD-10-CM | POA: Diagnosis not present

## 2021-07-08 DIAGNOSIS — G500 Trigeminal neuralgia: Secondary | ICD-10-CM | POA: Diagnosis not present

## 2021-07-09 ENCOUNTER — Encounter (HOSPITAL_COMMUNITY): Payer: Self-pay | Admitting: Gastroenterology

## 2021-07-15 ENCOUNTER — Other Ambulatory Visit: Payer: Self-pay

## 2021-07-15 ENCOUNTER — Ambulatory Visit (HOSPITAL_COMMUNITY)
Admission: RE | Admit: 2021-07-15 | Discharge: 2021-07-15 | Disposition: A | Discharge status: Home / Self Care | Payer: Medicare Other | Source: Ambulatory Visit | Attending: Hematology | Admitting: Hematology

## 2021-07-15 DIAGNOSIS — C153 Malignant neoplasm of upper third of esophagus: Secondary | ICD-10-CM | POA: Insufficient documentation

## 2021-07-15 DIAGNOSIS — C159 Malignant neoplasm of esophagus, unspecified: Secondary | ICD-10-CM | POA: Diagnosis not present

## 2021-07-15 DIAGNOSIS — Z931 Gastrostomy status: Secondary | ICD-10-CM | POA: Diagnosis not present

## 2021-07-15 MED ORDER — IOHEXOL 300 MG/ML  SOLN
75.0000 mL | Freq: Once | INTRAMUSCULAR | Status: AC | PRN
  Administered 2021-07-15: 75 mL via INTRAVENOUS

## 2021-07-15 MED ORDER — SODIUM CHLORIDE (PF) 0.9 % IJ SOLN
INTRAMUSCULAR | Status: AC
  Filled 2021-07-15: qty 50

## 2021-07-16 ENCOUNTER — Inpatient Hospital Stay: Payer: Medicare Other

## 2021-07-16 ENCOUNTER — Inpatient Hospital Stay: Payer: Medicare Other | Admitting: Dietician

## 2021-07-16 ENCOUNTER — Encounter: Payer: Self-pay | Admitting: Hematology

## 2021-07-16 ENCOUNTER — Inpatient Hospital Stay: Payer: Medicare Other | Admitting: Hematology

## 2021-07-16 VITALS — BP 121/64 | HR 60 | Temp 97.9°F | Resp 18 | Wt 130.2 lb

## 2021-07-16 DIAGNOSIS — C153 Malignant neoplasm of upper third of esophagus: Secondary | ICD-10-CM

## 2021-07-16 DIAGNOSIS — Z79899 Other long term (current) drug therapy: Secondary | ICD-10-CM | POA: Diagnosis not present

## 2021-07-16 DIAGNOSIS — Z5112 Encounter for antineoplastic immunotherapy: Secondary | ICD-10-CM | POA: Diagnosis not present

## 2021-07-16 LAB — CBC WITH DIFFERENTIAL (CANCER CENTER ONLY)
Abs Immature Granulocytes: 0.01 10*3/uL (ref 0.00–0.07)
Basophils Absolute: 0 10*3/uL (ref 0.0–0.1)
Basophils Relative: 0 %
Eosinophils Absolute: 0 10*3/uL (ref 0.0–0.5)
Eosinophils Relative: 1 %
HCT: 37.9 % (ref 36.0–46.0)
Hemoglobin: 12.6 g/dL (ref 12.0–15.0)
Immature Granulocytes: 0 %
Lymphocytes Relative: 13 %
Lymphs Abs: 0.6 10*3/uL — ABNORMAL LOW (ref 0.7–4.0)
MCH: 30.4 pg (ref 26.0–34.0)
MCHC: 33.2 g/dL (ref 30.0–36.0)
MCV: 91.3 fL (ref 80.0–100.0)
Monocytes Absolute: 0.7 10*3/uL (ref 0.1–1.0)
Monocytes Relative: 14 %
Neutro Abs: 3.3 10*3/uL (ref 1.7–7.7)
Neutrophils Relative %: 72 %
Platelet Count: 161 10*3/uL (ref 150–400)
RBC: 4.15 MIL/uL (ref 3.87–5.11)
RDW: 13.1 % (ref 11.5–15.5)
WBC Count: 4.5 10*3/uL (ref 4.0–10.5)
nRBC: 0 % (ref 0.0–0.2)

## 2021-07-16 LAB — CMP (CANCER CENTER ONLY)
ALT: 13 U/L (ref 0–44)
AST: 17 U/L (ref 15–41)
Albumin: 3.6 g/dL (ref 3.5–5.0)
Alkaline Phosphatase: 80 U/L (ref 38–126)
Anion gap: 3 — ABNORMAL LOW (ref 5–15)
BUN: 25 mg/dL — ABNORMAL HIGH (ref 8–23)
CO2: 32 mmol/L (ref 22–32)
Calcium: 9.5 mg/dL (ref 8.9–10.3)
Chloride: 99 mmol/L (ref 98–111)
Creatinine: 0.74 mg/dL (ref 0.44–1.00)
GFR, Estimated: >60 mL/min (ref >60)
Glucose, Bld: 134 mg/dL — ABNORMAL HIGH (ref 70–99)
Potassium: 4.5 mmol/L (ref 3.5–5.1)
Sodium: 134 mmol/L — ABNORMAL LOW (ref 135–145)
Total Bilirubin: 0.5 mg/dL (ref 0.3–1.2)
Total Protein: 6.1 g/dL — ABNORMAL LOW (ref 6.5–8.1)

## 2021-07-16 LAB — TSH: TSH: 0.371 u[IU]/mL (ref 0.308–3.960)

## 2021-07-16 MED ORDER — SODIUM CHLORIDE 0.9 % IV SOLN: Freq: Once | INTRAVENOUS | Status: AC

## 2021-07-16 MED ORDER — SODIUM CHLORIDE 0.9 % IV SOLN
240.0000 mg | Freq: Once | INTRAVENOUS | Status: AC
  Administered 2021-07-16: 240 mg via INTRAVENOUS
  Filled 2021-07-16: qty 24

## 2021-07-16 MED ORDER — ONDANSETRON 4 MG PO TBDP: 4.0000 mg | ORAL_TABLET | Freq: Three times a day (TID) | ORAL | 1 refills | Status: DC | PRN

## 2021-07-16 NOTE — Patient Instructions (Signed)
[facility]  Discharge Instructions: Thank you for choosing [facility] to provide your oncology and hematology care.   If you have a lab appointment with the [facility], please go directly to the [facility] and check in at the registration area.   Wear comfortable clothing and clothing appropriate for easy access to any Portacath or PICC line.   We strive to give you quality time with your provider. You may need to reschedule your appointment if you arrive late (15 or more minutes).  Arriving late affects you and other patients whose appointments are after yours.  Also, if you miss three or more appointments without notifying the office, you may be dismissed from the clinic at the providers discretion.      For prescription refill requests, have your pharmacy contact our office and allow 72 hours for refills to be completed.    Today you received the following chemotherapy and/or immunotherapy agents :Opdivo      To help prevent nausea and vomiting after your treatment, we encourage you to take your nausea medication as directed.  BELOW ARE SYMPTOMS THAT SHOULD BE REPORTED IMMEDIATELY: *FEVER GREATER THAN 100.4 F (38 C) OR HIGHER *CHILLS OR SWEATING *NAUSEA AND VOMITING THAT IS NOT CONTROLLED WITH YOUR NAUSEA MEDICATION *UNUSUAL SHORTNESS OF BREATH *UNUSUAL BRUISING OR BLEEDING *URINARY PROBLEMS (pain or burning when urinating, or frequent urination) *BOWEL PROBLEMS (unusual diarrhea, constipation, pain near the anus) TENDERNESS IN MOUTH AND THROAT WITH OR WITHOUT PRESENCE OF ULCERS (sore throat, sores in mouth, or a toothache) UNUSUAL RASH, SWELLING OR PAIN  UNUSUAL VAGINAL DISCHARGE OR ITCHING   Items with * indicate a potential emergency and should be followed up as soon as possible or go to the Emergency Department if any problems should occur.  Please show the CHEMOTHERAPY ALERT CARD or IMMUNOTHERAPY ALERT CARD at check-in to the  Emergency Department and triage nurse.  Should you have questions after your visit or need to cancel or reschedule your appointment, please contact [facility]  Dept: [phone]  and follow the prompts.  Office hours are 8:00 a.m. to 4:30 p.m. Monday - Friday. Please note that voicemails left after 4:00 p.m. may not be returned until the following business day.  We are closed weekends and major holidays. You have access to a nurse at all times for urgent questions. Please call the main number to the clinic Dept: [phone] and follow the prompts.   For any non-urgent questions, you may also contact your provider using MyChart. We now offer e-Visits for anyone 44 and older to request care online for non-urgent symptoms. For details visit [email or web address].   Also download the MyChart app! Go to the app store, search "MyChart", open the app, select [facility], and log in with your MyChart username and password.  Due to Covid, a mask is required upon entering the hospital/clinic. If you do not have a mask, one will be given to you upon arrival. For doctor visits, patients may have 1 support person aged 4 or older with them. For treatment visits, patients cannot have anyone with them due to current Covid guidelines and our immunocompromised population.

## 2021-07-16 NOTE — Progress Notes (Signed)
Nutrition Follow-up:  Patient completed concurrent chemoradiation for esophageal cancer. She is currently receiving Nivolumab q2 weeks, started 05/21/21.  Met with patient during infusion. She reports ongoing dysphagia. She is able to take "tiny sips." Patient has tried small bites of yogurt. Patient is scheduled for second EGD with dilation on Monday 2/27. Patient is tolerating 5 cartons of Nutren 1.5 via PEG. Patient requesting additional syringes for bolus feeds.   Medications: reviewed  Labs: Na 134, Glucose 134, BUN 25  Anthropometrics: Weight 130 lb 3 oz today stable    NUTRITION DIAGNOSIS: inadequate oral intake continues, pt relying on tube feedings for nutrition/hydration   INTERVENTION:  Continue Nutren 1.5 - one carton 5x/day via PEG Encouraged oral intake of fluids as tolerated Provided assistance with afternoon tube feeding - pt tolerated one carton Nutren 1.5 with 120 ml water before and 60 ml water after Extra tube feeding supplies provided per pt request    MONITORING, EVALUATION, GOAL: weight trends, intake, tube feeding   NEXT VISIT: Friday April 7 during infusion

## 2021-07-16 NOTE — Progress Notes (Signed)
[place]   [phone] [phone]   Clinic Follow up Note   Patient Care Team: [name], [name], MD as PCP - General [name], MD as PCP - Cardiology (Cardiology) Cindie Crumbly (Neurosurgery) Irine Seal, MD as Attending Physician (Urology) Erline Levine, MD as Consulting Physician (Neurosurgery) [name], MD as Consulting Physician (Neurology) Viona Gilmore, [facility] as Pharmacist (Pharmacist) [name], MD as Consulting Physician (Gastroenterology) Carol Ada, MD as Consulting Physician (Gastroenterology) [name], MD (Inactive) as Consulting Physician (Otolaryngology) [name], MD as Consulting Physician (Hematology) Kyung Rudd, MD as Consulting Physician (Radiation Oncology) Kyung Rudd, MD as Consulting Physician (Radiation Oncology)  Date of Service:  07/16/2021  CHIEF COMPLAINT: f/u of esophageal cancer  CURRENT THERAPY:  Nivolumab q2weeks, started 05/21/21  ASSESSMENT & PLAN:  [name] is a 81 y.o. female with   1. Squamous cell carcinoma of cervical esophagus, cTxN2M0 -presented with dysphagia and weight loss. EGD 9/15 and CT neck 02/05/21 showed 6 cm mass in the cervical/upper esophagus with possible local adenopathy. Path confirmed squamous cell carcinoma of the esophagus, PDL1 testing 95% positive -She had feeding tube placed by Dr. Harlow Asa 02/22/21  -PET scan 02/23/21 shows hypermetabolic esophageal mass and hypermetabolic paratracheal nodes, negative for distant metastasis.  -due to the location in the cervical esophagus, Dr. Kipp Brood feels she is not a surgical candidate.  -she received concurrent chemoRT with carbo/taxol 10/17-11/23/22. She required dose reduction with last cycle due to radiation dermatitis, hoarseness, dysphagia, and odynophagia.  -given PDL1+, she began immunotherapy with Nivolumab on 05/21/21. She will receive it every 2 weeks for the first two months,  then move to every 4 weeks if she tolerates well. Will plan to give this for a total of a year if NED. -neck CT 06/18/21 showed decrease in size of mass-like enlargement of cervical esophagus and size of lymph nodes. -EGD on 06/21/21 by Dr. Carlean Purl showed only inflammation. Biopsy was negative for residual cancer. -chest CT 07/15/21 showed resolution of esophageal mass, otherwise NED. -labs reviewed, overall adequate to proceed with Nivo today.   2. Dysphagia, weight loss, cough -Secondary to #1 -She underwent G-tube placement by Dr. Harlow Asa on 02/22/21 -she is currently taking in nutrition only by feeding tube, 5 times a day. Weight overall stable. -neck CT 06/18/21 showed decrease in size of esophageal mass and lymph nodes. -she had EGD with Dr. Carlean Purl on 06/21/21 and underwent upper esophageal dilation. She notes she is able to swallow some now. She is scheduled for repeat dilation on 2/27 and 3/16. -she also reports a cough from irritation in the throat. She is using prilosec.   3. 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 Trigeminal neuralgia, sciatica, headaches -On lamotrigene and oxcarbazepine  -pain has worsened before and during treatment -f/up Dr. Jaynee Eagles    5. HTN -continue benazepril, furosemide - I reduced benazepril to once daily from 03/09/21, and let her know to hold lasix if she becomes dizzy at home   -monitoring closely    6. Goals of Care/Code Status -Code status was DNR from hospitalization 05/16/20, changed to full code during 02/26/21 hospitalization for feeding tube placement       PLAN: -proceed with Nivo today -I refilled zofran -EGD 2/27 and 3/16 with Dr. Carlean Purl -lab, f/u,  and Nivo in 2 weeks, then change to every 4 weeks after next infusion     No problem-specific Assessment & Plan notes found for this encounter.   SUMMARY OF ONCOLOGIC HISTORY: Oncology History  Malignant neoplasm of upper third esophagus ([name])  12/21/2020 Imaging   Laryngoscopy Comments:    On fiberoptic laryngoscopy through the right nostril the nasopharynx was clear.  The base of tongue vallecula and epiglottis were normal.    Piriform sinuses were clear bilaterally and vocal cords were clear with  normal vocal mobility.  No structural abnormalities noted.   01/22/2021 Imaging   DG esophagus IMPRESSION: 1. Luminal narrowing in the high cervical esophagus over a 3 cm segment is highly concerning for esophageal neoplasm. Inflammatory process would be a secondary consideration. Luminal narrowing occurs approximately at the C5-C7 vertebral body level just below the glottis. Patient experienced several episodes of choking related to this luminal narrowing. Recommend expedient upper GI endoscopy for evaluation. 2. Distal thoracic esophagus and GE junction appear normal.   02/04/2021 Procedure   EGD by Dr. Carlean Purl impression- One mass-like severe stenosis was found in the upper third of the esophagus. The stenosis was not traversed.   02/05/2021 Imaging   CT soft tissue neck w contrast IMPRESSION: Mass-like soft tissue thickening and mucosal hyperenhancement of the cervical esophagus and hypopharynx, spanning the C4-T1 levels, likely reflecting an esophageal malignancy. This measures up to 2.4 x 3.4 cm in transaxial dimensions, and 6.3 cm in craniocaudal dimension. Associated severe effacement of the esophageal lumen.   Centrally necrotic right paratracheal lymph node beneath the level of the mass, measuring 1.3 x 0.9 cm, and likely reflecting a site of nodal metastatic disease.   Additional lymph nodes along the posterior and inferior aspect of the right thyroid lobe (along the right aspect of the esophageal mass), which measure subcentimeter but are asymmetrically  prominent and highly suspicious for additional sites of nodal metastatic disease.   02/12/2021 Pathology Results   FINAL MICROSCOPIC DIAGNOSIS:  A. ESOPHAGUS, UPPER CERVICAL #1, BIOPSY:  - Squamous cell carcinoma.  B. ESOPHAGUS, UPPER CERVICAL #2, BIOPSY:  - Squamous cell carcinoma.    02/12/2021 Cancer Staging   Staging form: Esophagus - Squamous Cell Carcinoma, AJCC 8th Edition - Clinical stage from 02/12/2021: Stage Unknown (cTX, cN2, cM0) - Signed by [name], MD on 03/08/2021 Stage prefix: Initial diagnosis    02/18/2021 Initial Diagnosis   Malignant neoplasm of upper third esophagus ([place])   02/23/2021 PET scan   IMPRESSION: Markedly hypermetabolic focal masslike thickening of the cervical esophagus, compatible with primary esophageal malignancy.   Markedly hypermetabolic right upper paratracheal lymph node, compatible with metastatic disease.   Small right upper paratracheal and right level IIA lymph nodes with mild hypermetabolic activity, concerning for additional sites of nodal metastatic disease.   Aortic Atherosclerosis (ICD10-I70.0).   03/09/2021 - 04/13/2021 Chemotherapy   Patient is on Treatment Plan : ESOPHAGUS Carboplatin/PACLitaxel weekly x 6 weeks with XRT       05/21/2021 -  Chemotherapy   Patient is on Treatment Plan : GASTROESOPHAGEAL Nivolumab q14d x 8 cycles / Nivolumab q28d        INTERVAL HISTORY:  [name] is here for a follow up of esophageal cancer. She was last seen by me on 07/02/21. She presents to the clinic accompanied by her husband. She reports she is not feeling well today. She reports nausea without vomiting, diarrhea, and cough. She does report her cough is improving, though she does note  she is still producing thick mucus that she is unable to swallow. She denies shortness of breath.   All other systems were reviewed with the patient and are negative.  MEDICAL HISTORY:  Past Medical History:  Diagnosis Date   Abdominal  pain 05/29/2013   s/p rx of cephalo resistant e coli   but last rx NG  now residular ?  bladder sx repeat cx sx rx to ty and uro consult    ADJ DISORDER WITH MIXED ANXIETY & DEPRESSED MOOD 03/03/2010   Qualifier: Diagnosis of  By: [name], [name]    Agent resistant to multiple antibiotics 05/29/2013   e coli   bu NG on fu.     Anemia    Anxiety    ARF (acute renal failure) (HCC) 03/12/2015   Closed head injury 02/01/2011   from syncope and had scalp laceration  neg ct .     Closed head injury 5-6 yrs ago   Colitis 46/56/8127   Complication of anesthesia    migraine several hours after general anesthesia   Depression    esophageal ca 01/2021   Fatty liver    Gall stones 2016   see ct scan neg HIDA    GERD (gastroesophageal reflux disease)    Hearing aid worn    HOH (hard of hearing)    both ears   Hyperlipidemia    Hypertension    echo nl lv function  mild dilitation 2009   Kidney infection    few yrs ago in hospital   Medication side effect 09/02/2010   Poss muscle se of 10 crestor    Migraine    hypnic HA eval by Dr. 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    Pyelonephritis 03/12/2015   Sensation of pain in anesthetized distribution of trigeminal nerve    Syncope 02/01/2011   In shower on vacation  sustained head laceration  8 sutures Had ed visit neg head ct labs and x ray    Trigeminal neuralgia pain     SURGICAL HISTORY: Past Surgical History:  Procedure Laterality Date   ABDOMINAL HYSTERECTOMY  2002   tubal   BACK SURGERY     2 times, for sciatic nerve pain   BALLOON DILATION  06/21/2021   Procedure: DILATION BALLOON used for esophageal stricture;  Surgeon: Irving Copas., MD;  Location: [facility];  Service: Gastroenterology;;   CARDIAC CATHETERIZATION  2000   chest pains neg   CHOLECYSTECTOMY N/A 02/21/2017   Procedure: LAPAROSCOPIC CHOLECYSTECTOMY WITH INTRAOPERATIVE CHOLANGIOGRAM;  Surgeon: Armandina Gemma, MD;   Location: [facility];  Service: General;  Laterality: N/A;   COLONOSCOPY     multiple   CRANIOTOMY  12/09/2011   nerve decompression right trigeminal    DIRECT LARYNGOSCOPY N/A 02/12/2021   Procedure: DIRECT LARYNGOSCOPY AND BIOPSY POSSIBLE FROZEN;  Surgeon: [name], MD;  Location: [facility];  Service: ENT;  Laterality: N/A;   DOPPLER ECHOCARDIOGRAPHY  2009   nl lv function mild lv dilitation   ESOPHAGOGASTRODUODENOSCOPY (EGD) WITH PROPOFOL N/A 06/21/2021   Procedure: ESOPHAGOGASTRODUODENOSCOPY (EGD) WITH PROPOFOL;  Surgeon: Irving Copas., MD;  Location: [facility];  Service: Gastroenterology;  Laterality: N/A;  Request Fluoroscopy; Plan in for dilation   EYE SURGERY Bilateral    ioc for catatracts   GASTROSTOMY N/A 02/26/2021   Procedure: OPEN GASTROSTOMY TUBE PLACEMENT;  Surgeon: Armandina Gemma, MD;  Location: [facility];  Service:  General;  Laterality: N/A;   IR GASTROSTOMY TUBE MOD SED  02/22/2021   laparoscopic gallbladder surgery  02/16/2017   Fax from [facility] Bilateral 2002   rt shoulder surgery     SAVORY DILATION N/A 06/21/2021   Procedure: SAVORY DILATION;  Surgeon: Irving Copas., MD;  Location: [facility];  Service: Gastroenterology;  Laterality: N/A;    I have reviewed the social history and family history with the patient and they are unchanged from previous note.  ALLERGIES:  is allergic to hydrocodone and sulfamethoxazole-trimethoprim.  MEDICATIONS:  Current Outpatient Medications  Medication Sig Dispense Refill   ondansetron (ZOFRAN-ODT) 4 MG disintegrating tablet Take 1-2 tablets (4-8 mg total) by mouth every 8 (eight) hours as needed for nausea or vomiting. 30 tablet 1   alendronate (FOSAMAX) 70 MG tablet TAKE 1 TABLET BY MOUTH 1 TIME A WEEK WITH FULL GLASS OF WATER AND ON AN EMPTY STOMACH AS DIRECTED (Patient not taking: Reported on 06/08/2021) 4 tablet 2   benazepril (LOTENSIN) 20 MG tablet Take  20 mg by mouth daily.     butorphanol (STADOL) 10 MG/ML nasal spray Place 1 spray into the nose every 4 (four) hours as needed for headache (trigeminal neuralgia.). 2.5 mL 0   Calcium Carb-Cholecalciferol (CALCIUM 600+D) 600-800 MG-UNIT TABS Take 1 tablet by mouth 2 (two) times daily.     carvedilol (COREG) 25 MG tablet TAKE 1 TABLET(25 MG) BY MOUTH TWICE DAILY WITH A MEAL 60 tablet 3   conjugated estrogens (PREMARIN) vaginal cream Place 1 applicator vaginally 2 (two) times a week.     fluticasone (FLONASE) 50 MCG/ACT nasal spray Place 2 sprays into both nostrils as needed for allergies or rhinitis. 16 g 5   furosemide (LASIX) 20 MG tablet TAKE 1 TABLET(20 MG) BY MOUTH DAILY 90 tablet 0   guaiFENesin-codeine 100-10 MG/5ML syrup TAKE 5 ML BY MOUTH EVERY 6 HOURS AS NEEDED FOR COUGH 240 mL 0   lamoTRIgine (LAMICTAL) 100 MG tablet Take 100 mg by mouth 2 (two) times daily.     Lisinopril 1 MG/ML SOLN Take 20 mg  daily Per g tube (Patient not taking: Reported on 07/13/2021) 600 mL 2   Multiple Vitamin (MULTIVITAMIN ADULT PO) Take 1 tablet by mouth daily.     Nutritional Supplements (NUTREN 1.5) LIQD 6 Cans by Enteral route 5 (five) times daily. 1500 mL 6   omeprazole (PRILOSEC) 20 MG capsule Take 20 mg by mouth at bedtime.     OXcarbazepine (TRILEPTAL) 300 MG/5ML suspension Take 2.5 mLs (150 mg total) by mouth 2 (two) times daily. 250 mL 3   pantoprazole (PROTONIX) 20 MG tablet Take 1 tablet (20 mg total) by mouth daily. (Patient not taking: Reported on 07/13/2021) 30 tablet 0   Polyethyl Glycol-Propyl Glycol (SYSTANE) 0.4-0.3 % GEL ophthalmic gel Place 1 application into both eyes 2 (two) times daily as needed (dry eyes).     prochlorperazine (COMPAZINE) 10 MG tablet TAKE 1 TABLET(10 MG) BY MOUTH EVERY 6 HOURS AS NEEDED FOR NAUSEA OR VOMITING 30 tablet 1   No current facility-administered medications for this visit.    PHYSICAL EXAMINATION: ECOG PERFORMANCE STATUS: 2 - Symptomatic, <50% confined to  bed  Vitals:   07/16/21 1357  BP: 121/64  Pulse: 60  Resp: 18  Temp: 97.9 F (36.6 C)   Wt Readings from Last 3 Encounters:  07/16/21 130 lb 3 oz (59.1 kg)  07/02/21 129 lb 3 oz (58.6 kg)  06/21/21 124  lb (56.2 kg)     GENERAL:alert, no distress and comfortable SKIN: skin color normal, no rashes or significant lesions EYES: normal, Conjunctiva are pink and non-injected, sclera clear  NEURO: alert & oriented x 3 with fluent speech  LABORATORY DATA:  I have reviewed the data as listed CBC Latest Ref Rng & Units 07/16/2021 07/02/2021 06/18/2021  WBC 4.0 - 10.5 K/uL 4.5 4.3 4.2  Hemoglobin 12.0 - 15.0 g/dL 12.6 13.0 13.2  Hematocrit 36.0 - 46.0 % 37.9 39.9 39.1  Platelets 150 - 400 K/uL 161 174 155     CMP Latest Ref Rng & Units 07/16/2021 07/02/2021 06/18/2021  Glucose 70 - 99 mg/dL 134(H) 112(H) 206(H)  BUN 8 - 23 mg/dL 25(H) 25(H) 23  Creatinine 0.44 - 1.00 mg/dL 0.74 0.68 0.63  Sodium 135 - 145 mmol/L 134(L) 134(L) 131(L)  Potassium 3.5 - 5.1 mmol/L 4.5 4.4 4.4  Chloride 98 - 111 mmol/L 99 99 96(L)  CO2 22 - 32 mmol/L 32 31 31  Calcium 8.9 - 10.3 mg/dL 9.5 9.5 9.3  Total Protein 6.5 - 8.1 g/dL 6.1(L) 6.3(L) 6.2(L)  Total Bilirubin 0.3 - 1.2 mg/dL 0.5 0.5 0.6  Alkaline Phos 38 - 126 U/L 80 79 76  AST 15 - 41 U/L _0 ALT 0 - 44 U/L _1 RADIOGRAPHIC STUDIES: I have personally reviewed the radiological images as listed and agreed with the findings in the report. CT Chest W Contrast  Result Date: 07/16/2021 CLINICAL DATA:  Esophageal cancer.  Assess treatment response EXAM: CT CHEST WITH CONTRAST TECHNIQUE: Multidetector CT imaging of the chest was performed during intravenous contrast administration. RADIATION DOSE REDUCTION: This exam was performed according to the departmental dose-optimization program which includes automated exposure control, adjustment of the mA and/or kV according to patient size and/or use of iterative reconstruction technique.  CONTRAST:  77m OMNIPAQUE IOHEXOL 300 MG/ML  SOLN COMPARISON:  PET-CT 02/23/2021, neck CT 06/18/2021 FINDINGS: Cardiovascular: No intracranial hemorrhage. No parenchymal contusion. No midline shift or mass effect. Basilar cisterns are patent. No skull base fracture. No fluid in the paranasal sinuses. Orbits are normal. Mediastinum/Nodes: Decrease in thickening of the proximal esophagus compared to prior PET-CT exam. No enlarged mediastinal nodes or hilar nodes identified. Distal esophagus normal. Lungs/Pleura: No suspicious pulmonary nodules.  Airways normal. Upper Abdomen: Percutaneous gastrostomy tube with retention bulb in the stomach. No upper abdominal adenopathy. Musculoskeletal: No aggressive osseous lesion. IMPRESSION: 1. Resolution proximal esophageal mass. 2. No evidence esophageal carcinoma recurrence. 3. No mediastinal lymphadenopathy. 4. No pleural metastasis. 5. PEG  tube in place. Electronically Signed   By: SSuzy BouchardM.D.   On: 07/16/2021 14:14      No orders of the defined types were placed in this encounter.  All questions were answered. The patient knows to call the clinic with any problems, questions or concerns. No barriers to learning was detected. The total time spent in the appointment was 30 minutes.     YTruitt Merle MD 07/16/2021   I, KWilburn Mylar am acting as scribe for YTruitt Merle MD.   I have reviewed the above documentation for accuracy and completeness, and I agree with the above.

## 2021-07-17 LAB — T4: T4, Total: 7.7 ug/dL (ref 4.5–12.0)

## 2021-07-19 ENCOUNTER — Encounter (HOSPITAL_COMMUNITY)
Admission: RE | Disposition: A | Discharge status: Home / Self Care | Payer: Self-pay | Source: Home / Self Care | Attending: Gastroenterology

## 2021-07-19 ENCOUNTER — Ambulatory Visit (HOSPITAL_COMMUNITY): Payer: Medicare Other | Admitting: Anesthesiology

## 2021-07-19 ENCOUNTER — Ambulatory Visit (HOSPITAL_BASED_OUTPATIENT_CLINIC_OR_DEPARTMENT_OTHER): Payer: Medicare Other | Admitting: Anesthesiology

## 2021-07-19 ENCOUNTER — Ambulatory Visit (HOSPITAL_COMMUNITY)
Admission: RE | Admit: 2021-07-19 | Discharge: 2021-07-19 | Disposition: A | Discharge status: Home / Self Care | Payer: Medicare Other | Attending: Gastroenterology | Admitting: Gastroenterology

## 2021-07-19 ENCOUNTER — Ambulatory Visit (HOSPITAL_COMMUNITY): Payer: Medicare Other

## 2021-07-19 ENCOUNTER — Encounter (HOSPITAL_COMMUNITY): Payer: Self-pay | Admitting: Gastroenterology

## 2021-07-19 ENCOUNTER — Telehealth: Payer: Self-pay | Admitting: Internal Medicine

## 2021-07-19 ENCOUNTER — Other Ambulatory Visit: Payer: Self-pay | Admitting: Internal Medicine

## 2021-07-19 DIAGNOSIS — K219 Gastro-esophageal reflux disease without esophagitis: Secondary | ICD-10-CM | POA: Diagnosis not present

## 2021-07-19 DIAGNOSIS — K222 Esophageal obstruction: Secondary | ICD-10-CM

## 2021-07-19 DIAGNOSIS — Z85828 Personal history of other malignant neoplasm of skin: Secondary | ICD-10-CM | POA: Insufficient documentation

## 2021-07-19 DIAGNOSIS — I110 Hypertensive heart disease with heart failure: Secondary | ICD-10-CM | POA: Insufficient documentation

## 2021-07-19 DIAGNOSIS — R1310 Dysphagia, unspecified: Secondary | ICD-10-CM | POA: Diagnosis not present

## 2021-07-19 DIAGNOSIS — K2289 Other specified disease of esophagus: Secondary | ICD-10-CM

## 2021-07-19 DIAGNOSIS — K9423 Gastrostomy malfunction: Secondary | ICD-10-CM

## 2021-07-19 DIAGNOSIS — I509 Heart failure, unspecified: Secondary | ICD-10-CM | POA: Insufficient documentation

## 2021-07-19 DIAGNOSIS — Z9221 Personal history of antineoplastic chemotherapy: Secondary | ICD-10-CM | POA: Insufficient documentation

## 2021-07-19 HISTORY — PX: ESOPHAGOGASTRODUODENOSCOPY (EGD) WITH PROPOFOL: SHX5813

## 2021-07-19 HISTORY — PX: SAVORY DILATION: SHX5439

## 2021-07-19 SURGERY — ESOPHAGOGASTRODUODENOSCOPY (EGD) WITH PROPOFOL
Anesthesia: General

## 2021-07-19 MED ORDER — EPHEDRINE SULFATE (PRESSORS) 50 MG/ML IJ SOLN
INTRAMUSCULAR | Status: DC | PRN
Start: 2021-07-19 — End: 2021-07-19
  Administered 2021-07-19 (×2): 5 mg via INTRAVENOUS

## 2021-07-19 MED ORDER — SUGAMMADEX SODIUM 200 MG/2ML IV SOLN
INTRAVENOUS | Status: DC | PRN
  Administered 2021-07-19: 200 mg via INTRAVENOUS

## 2021-07-19 MED ORDER — SUCRALFATE 1 GM/10ML PO SUSP: 1.0000 g | Freq: Two times a day (BID) | ORAL | 1 refills | Status: DC

## 2021-07-19 MED ORDER — OMEPRAZOLE 2 MG/ML ORAL SUSPENSION: 20.0000 mg | Freq: Two times a day (BID) | ORAL | 12 refills | Status: DC

## 2021-07-19 MED ORDER — SODIUM CHLORIDE 0.9 % IV SOLN: INTRAVENOUS | Status: DC

## 2021-07-19 MED ORDER — FENTANYL CITRATE (PF) 100 MCG/2ML IJ SOLN
INTRAMUSCULAR | Status: DC | PRN
  Administered 2021-07-19: 50 ug via INTRAVENOUS
  Administered 2021-07-19: 25 ug via INTRAVENOUS

## 2021-07-19 MED ORDER — SUCCINYLCHOLINE CHLORIDE 200 MG/10ML IV SOSY
PREFILLED_SYRINGE | INTRAVENOUS | Status: DC | PRN
Start: 2021-07-19 — End: 2021-07-19
  Administered 2021-07-19: 100 mg via INTRAVENOUS

## 2021-07-19 MED ORDER — LIDOCAINE HCL (CARDIAC) PF 100 MG/5ML IV SOSY
PREFILLED_SYRINGE | INTRAVENOUS | Status: DC | PRN
Start: 2021-07-19 — End: 2021-07-19
  Administered 2021-07-19: 50 mg via INTRAVENOUS

## 2021-07-19 MED ORDER — LACTATED RINGERS IV SOLN: INTRAVENOUS | Status: DC | PRN

## 2021-07-19 MED ORDER — ROCURONIUM BROMIDE 100 MG/10ML IV SOLN
INTRAVENOUS | Status: DC | PRN
  Administered 2021-07-19: 20 mg via INTRAVENOUS

## 2021-07-19 MED ORDER — DEXAMETHASONE SODIUM PHOSPHATE 10 MG/ML IJ SOLN
INTRAMUSCULAR | Status: DC | PRN
  Administered 2021-07-19: 10 mg via INTRAVENOUS

## 2021-07-19 MED ORDER — PROPOFOL 10 MG/ML IV BOLUS
INTRAVENOUS | Status: DC | PRN
  Administered 2021-07-19: 150 mg via INTRAVENOUS

## 2021-07-19 MED ORDER — FENTANYL CITRATE (PF) 100 MCG/2ML IJ SOLN
INTRAMUSCULAR | Status: AC
  Filled 2021-07-19: qty 2

## 2021-07-19 MED ORDER — ONDANSETRON HCL 4 MG/2ML IJ SOLN
INTRAMUSCULAR | Status: DC | PRN
  Administered 2021-07-19: 4 mg via INTRAVENOUS

## 2021-07-19 SURGICAL SUPPLY — 15 items

## 2021-07-19 NOTE — Telephone Encounter (Signed)
Patient called in requesting a refill for carvedilol (COREG) 25 MG tablet [[id number]]  to be sent to her pharmacy.  Patient stated that this is urgent and she needs this today.  Please advise.

## 2021-07-19 NOTE — Anesthesia Postprocedure Evaluation (Signed)
Anesthesia Post Note  Patient: [name]  Procedure(s) Performed: ESOPHAGOGASTRODUODENOSCOPY (EGD) WITH PROPOFOL SAVORY DILATION     Patient location during evaluation: PACU Anesthesia Type: General Level of consciousness: awake and sedated Pain management: pain level controlled Vital Signs Assessment: post-procedure vital signs reviewed and stable Respiratory status: spontaneous breathing Cardiovascular status: stable Postop Assessment: no apparent nausea or vomiting Anesthetic complications: no   No notable events documented.  Last Vitals:  Vitals:   07/19/21 1501 07/19/21 1515  BP: (!) 163/81 (!) 159/80  Pulse: 75 68  Resp: 16 11  Temp:    SpO2: 100% 99%    Last Pain:  Vitals:   07/19/21 1317  TempSrc: Temporal  PainSc: [place] Jr

## 2021-07-19 NOTE — Anesthesia Postprocedure Evaluation (Signed)
Anesthesia Post Note  Patient: [name]  Procedure(s) Performed: ESOPHAGOGASTRODUODENOSCOPY (EGD) WITH PROPOFOL SAVORY DILATION     Patient location during evaluation: Endoscopy Anesthesia Type: General Level of consciousness: awake and sedated Pain management: pain level controlled Vital Signs Assessment: post-procedure vital signs reviewed and stable Respiratory status: spontaneous breathing Cardiovascular status: stable Postop Assessment: no apparent nausea or vomiting Anesthetic complications: no   No notable events documented.  Last Vitals:  Vitals:   07/19/21 1501 07/19/21 1515  BP: (!) 163/81 (!) 159/80  Pulse: 75 68  Resp: 16 11  Temp:    SpO2: 100% 99%    Last Pain:  Vitals:   07/19/21 1317  TempSrc: Temporal  PainSc: [place] Jr

## 2021-07-19 NOTE — Interval H&P Note (Signed)
History and Physical Interval Note:  07/19/2021 1:29 PM  [name]  has presented today for surgery, with the diagnosis of esophageal mass, dysphagia.  The various methods of treatment have been discussed with the patient and family. After consideration of risks, benefits and other options for treatment, the patient has consented to  Procedure(s) with comments: ESOPHAGOGASTRODUODENOSCOPY (EGD) WITH PROPOFOL (N/A) - Dilation fluoro as a surgical intervention.  The patient's history has been reviewed, patient examined, no change in status, stable for surgery.  I have reviewed the patient's chart and labs.  Questions were answered to the patient's satisfaction.     [employer]

## 2021-07-19 NOTE — Op Note (Signed)
[facility] Patient Name: [name] Procedure Date: 07/19/2021 MRN: [id number] Attending MD: Justice Britain , MD Date of Birth: November 07, 1940 CSN: [id number] Age: 81 Admit Type: Outpatient Procedure:                Upper GI endoscopy Indications:              Dysphagia, Stricture of the esophagus, Follow-up of                            esophageal stricture Providers:                Justice Britain, MD, Jeanella Cara, RN,                            Lonnie "Tyna Jaksch, Technician Referring MD:             [name], [name], MD, [name]. [name] Medicines:                General Anesthesia Complications:            No immediate complications. Estimated Blood Loss:     Estimated blood loss was minimal. Procedure:                Pre-Anesthesia Assessment:                           - Prior to the procedure, a History and Physical                            was performed, and patient medications and                            allergies were reviewed. The patient's tolerance of                            previous anesthesia was also reviewed. The risks                            and benefits of the procedure and the sedation                            options and risks were discussed with the patient.                            All questions were answered, and informed consent                            was obtained. Prior Anticoagulants: The patient has                            taken no previous anticoagulant or antiplatelet                            agents. ASA Grade Assessment: III - A patient with  severe systemic disease. After reviewing the risks                            and benefits, the patient was deemed in                            satisfactory condition to undergo the procedure.                           After obtaining informed consent, the endoscope was                            passed under direct  vision. Throughout the                            procedure, the patient's blood pressure, pulse, and                            oxygen saturations were monitored continuously. The                            [id number]-[id number] ([id number] Olympus ultra slim endoscope                            was introduced through the mouth, and advanced to                            the second part of duodenum. The upper GI endoscopy                            was accomplished without difficulty. The patient                            tolerated the procedure. Scope In: Scope Out: Findings:      One benign-appearing, intrinsic severe (stenosis; an endoscope cannot       pass) stenosis was found 15 to 21 cm from the incisors. This stenosis       measured 4 mm (inner diameter) x 6 cm (in length). The stenosis was       traversed only after dilation up to 6 mm. A guidewire was placed under       fluoroscopic guidance and the scope was withdrawn. Dilation was       performed with a Savary dilator with no resistance at 5 mm and 6 mm,       mild resistance at 7 mm and moderate resistance at 8 mm. The dilation       site was examined following endoscope reinsertion and showed moderate       mucosal disruption, moderate improvement in luminal narrowing and no       perforation.      No other gross lesions were noted in the entire esophagus.      There was evidence of a gastrostomy tube present in the gastric body.       This was characterized by inflammation with tissue development. The       bumper was noted to be at  3 cm (I had placed it at 3.5 cm previously)       This did not allow for the bumper to freely move now. We adjusted the       bumper back to between 3.5 to 4.0 cm to allow for the bumper to freely       move and not show overt ulceration.      No other gross lesions were noted in the entire examined stomach.      No gross lesions were noted in the duodenal bulb, in the first portion       of the duodenum  and in the second portion of the duodenum. 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                           - No gross lesions in esophagus distally.                           - Gastrostomy tube present characterized by                            inflammation underneath (as it seems it was not                            buried but was too tight today). Readjusted this to                            3.5-4.0 cm.                           - No other gross lesions in the stomach.                           - No gross lesions in the duodenal bulb, in the                            first portion of the duodenum and in the second                            portion of the duodenum. Moderate Sedation:      Not Applicable - Patient had care per Anesthesia. Recommendation:           - The patient will be observed post-procedure,                            until all discharge criteria are met.                           - Discharge patient to home.                           -  Patient has a contact number available for                            emergencies. The signs and symptoms of potential                            delayed complications were discussed with the                            patient. Return to normal activities tomorrow.                            Written discharge instructions were provided to the                            patient.                           - NPO today.                           - Restart PPI therapy (have sent in Omeprazole 20                            mg as a liquid) to be used twice daily.                           - Try to intake Carafate twice daily by mouth. If                            unable to afford the Carafate liquid, then please                            let us know and we can teach out to make it a                             slurry that may be able to be attempted for                            swallowing.                           - If patient doing OK, could try clears for comfort                            but cannot recommend a full clear liquid diet as of                            yet.                           - Follow up with planned repeat EGD with our  partner Dr. Ardis Hughs in the coming weeks for further                            attempt at dilation.                           - Monitor for signs/symptoms of bleeding,                            perforation, and infection. If issues please call                            our number to get further assistance as needed.                           - The findings and recommendations were discussed                            with the patient.                           - The findings and recommendations were discussed                            with the patient's family.                           - The findings and recommendations were discussed                            with the referring physician. Procedure Code(s):        --- Professional ---                           [phone], Esophagogastroduodenoscopy, flexible,                            transoral; with insertion of guide wire followed by                            passage of dilator(s) through esophagus over guide                            wire                           04888, Intraluminal dilation of strictures and/or                            obstructions (eg, esophagus), radiological                            supervision and interpretation Diagnosis Code(s):        --- Professional ---  K22.2, Esophageal obstruction                           K94.23, Gastrostomy malfunction                           R13.10, Dysphagia, unspecified CPT copyright 2019 [employer]. All rights reserved. The codes documented in this report are  preliminary and upon coder review may  be revised to meet current compliance requirements. Justice Britain, MD 07/19/2021 2:59:13 PM Number of Addenda: 0

## 2021-07-19 NOTE — Transfer of Care (Signed)
Immediate Anesthesia Transfer of Care Note  Patient: [name]  Procedure(s) Performed: ESOPHAGOGASTRODUODENOSCOPY (EGD) WITH PROPOFOL SAVORY DILATION  Patient Location: PACU and Endoscopy Unit  Anesthesia Type:General  Level of Consciousness: awake, alert , oriented and patient cooperative  Airway & Oxygen Therapy: Patient Spontanous Breathing and Patient connected to face mask oxygen  Post-op Assessment: Report given to RN and Post -op Vital signs reviewed and stable  Post vital signs: Reviewed and stable  Last Vitals:  Vitals Value Taken Time  BP    Temp    Pulse 72 07/19/21 1503  Resp 21 07/19/21 1503  SpO2 100 % 07/19/21 1503  Vitals shown include unvalidated device data.  Last Pain:  Vitals:   07/19/21 1317  TempSrc: Temporal  PainSc: 9          Complications: No notable events documented.

## 2021-07-19 NOTE — Telephone Encounter (Signed)
RX sent

## 2021-07-19 NOTE — Anesthesia Procedure Notes (Signed)
Procedure Name: Intubation Date/Time: 07/19/2021 1:58 PM Performed by: Garrel Ridgel, CRNA Pre-anesthesia Checklist: Patient identified, Emergency Drugs available, Suction available and Patient being monitored Patient Re-evaluated:Patient Re-evaluated prior to induction Oxygen Delivery Method: Circle system utilized Preoxygenation: Pre-oxygenation with 100% oxygen Induction Type: IV induction Ventilation: Mask ventilation without difficulty Laryngoscope Size: Mac and 3 Grade View: Grade II Tube type: Oral Tube size: 7.0 mm Number of attempts: 1 Airway Equipment and Method: Stylet and Oral airway Placement Confirmation: ETT inserted through vocal cords under direct vision, positive ETCO2 and breath sounds checked- equal and bilateral Secured at: 22 cm Dental Injury: Teeth and Oropharynx as per pre-operative assessment

## 2021-07-19 NOTE — Anesthesia Preprocedure Evaluation (Signed)
Anesthesia Evaluation  Patient identified by MRN, date of birth, ID band Patient awake    Reviewed: Allergy & Precautions, NPO status , Patient's Chart, lab work & pertinent test results, reviewed documented beta blocker date and time   History of Anesthesia Complications (+) history of anesthetic complications (migraine after anesthesia)  Airway Mallampati: III  TM Distance: >3 FB Neck ROM: Full  Mouth opening: Limited Mouth Opening  Dental no notable dental hx. (+) Teeth Intact, Dental Advisory Given   Pulmonary neg pulmonary ROS,    Pulmonary exam normal breath sounds clear to auscultation       Cardiovascular hypertension, Pt. on medications and Pt. on home beta blockers +CHF (grade 2 diastolic dysfunction)   Rhythm:Regular Rate:Normal  Echo 2019: 1. Left ventricular ejection fraction, by estimation, is 55 to 60%. The  left ventricle has normal function. The left ventricle has no regional  wall motion abnormalities. There is moderate asymmetric left ventricular  hypertrophy of the basal-septal  segment. Left ventricular diastolic parameters are consistent with Grade  II diastolic dysfunction (pseudonormalization). Elevated left atrial  pressure.  2. Right ventricular systolic function is normal. The right ventricular  size is normal. There is normal pulmonary artery systolic pressure. The  estimated right ventricular systolic pressure is 32.9 mmHg.  3. The mitral valve is normal in structure. Trivial mitral valve  regurgitation.  4. The aortic valve is tricuspid. Aortic valve regurgitation is not  visualized. No aortic stenosis is present.  5. The inferior vena cava is normal in size with <50% respiratory  variability, suggesting right atrial pressure of 8 mmHg.    Neuro/Psych  Headaches, PSYCHIATRIC DISORDERS Anxiety Depression  Neuromuscular disease (trigeminal neuralgia)    GI/Hepatic Neg liver ROS, GERD   Controlled and Medicated,Hx esophageal ca, dysphagia    Endo/Other  negative endocrine ROS  Renal/GU   negative genitourinary   Musculoskeletal  (+) Arthritis , Osteoarthritis,    Abdominal Normal abdominal exam  (+)   Peds  Hematology hct 39.1   Anesthesia Other Findings   Reproductive/Obstetrics negative OB ROS                             Anesthesia Physical  Anesthesia Plan  ASA: 3  Anesthesia Plan: General   Post-op Pain Management: Minimal or no pain anticipated   Induction: Intravenous  PONV Risk Score and Plan: 3 and Propofol infusion, Treatment may vary due to age or medical condition and Ondansetron  Airway Management Planned: Oral ETT  Additional Equipment: None  Intra-op Plan:   Post-operative Plan: Extubation in OR  Informed Consent: I have reviewed the patients History and Physical, chart, labs and discussed the procedure including the risks, benefits and alternatives for the proposed anesthesia with the patient or authorized representative who has indicated his/her understanding and acceptance.   Patient has DNR.  Discussed DNR with patient and Suspend DNR.   Dental advisory given  Plan Discussed with: CRNA  Anesthesia Plan Comments: (D/w pt DNR- wishes to rescind for intraop Intubated per Dr. Rush Landmark request. Pt has history of neck radiation, potentially difficult egd/dilation)        Anesthesia Quick Evaluation

## 2021-07-19 NOTE — Discharge Instructions (Signed)
YOU HAD AN ENDOSCOPIC PROCEDURE TODAY: Refer to the procedure report and other information in the discharge instructions given to you for any specific questions about what was found during the examination. If this information does not answer your questions, please call [place] office at [phone] to clarify.  ° °YOU SHOULD EXPECT: Some feelings of bloating in the abdomen. Passage of more gas than usual. Walking can help get rid of the air that was put into your GI tract during the procedure and reduce the bloating. If you had a lower endoscopy (such as a colonoscopy or flexible sigmoidoscopy) you may notice spotting of blood in your stool or on the toilet paper. Some abdominal soreness may be present for a day or two, also. ° °DIET: Your first meal following the procedure should be a light meal and then it is ok to progress to your normal diet. A half-sandwich or bowl of soup is an example of a good first meal. Heavy or fried foods are harder to digest and may make you feel nauseous or bloated. Drink plenty of fluids but you should avoid alcoholic beverages for 24 hours. If you had a esophageal dilation, please see attached instructions for diet.   ° °ACTIVITY: Your care partner should take you home directly after the procedure. You should plan to take it easy, moving slowly for the rest of the day. You can resume normal activity the day after the procedure however YOU SHOULD NOT DRIVE, use power tools, machinery or perform tasks that involve climbing or major physical exertion for 24 hours (because of the sedation medicines used during the test).  ° °SYMPTOMS TO REPORT IMMEDIATELY: °A gastroenterologist can be reached at any hour. Please call [phone]  for any of the following symptoms:  °Following lower endoscopy (colonoscopy, flexible sigmoidoscopy) °Excessive amounts of blood in the stool  °Significant tenderness, worsening of abdominal pains  °Swelling of the abdomen that is new, acute  °Fever of 100° or  higher  °Following upper endoscopy (EGD, EUS, ERCP, esophageal dilation) °Vomiting of blood or coffee ground material  °New, significant abdominal pain  °New, significant chest pain or pain under the shoulder blades  °Painful or persistently difficult swallowing  °New shortness of breath  °Black, tarry-looking or red, bloody stools ° °FOLLOW UP:  °If any biopsies were taken you will be contacted by phone or by letter within the next 1-3 weeks. Call [phone]  if you have not heard about the biopsies in 3 weeks.  °Please also call with any specific questions about appointments or follow up tests. ° °

## 2021-07-20 ENCOUNTER — Encounter: Payer: Self-pay | Admitting: Dietician

## 2021-07-20 ENCOUNTER — Encounter (HOSPITAL_COMMUNITY): Payer: Self-pay | Admitting: Gastroenterology

## 2021-07-20 NOTE — Progress Notes (Signed)
Received call from husband of patient [name]). He reports running low on tube feeding formula. Husband contacted DME ([employer]) and was informed that Nutren 1.5 was currently on back order. [facility] expecting to have this restocked by 3/6. Will contact [place] on Monday to follow-up on this. Provided two complimentary cases of Osmolite 1.5 formula today.

## 2021-07-23 ENCOUNTER — Telehealth: Payer: Self-pay | Admitting: Pharmacist

## 2021-07-23 NOTE — Chronic Care Management (AMB) (Signed)
? ? ?  Chronic Care Management ?Pharmacy Assistant  ? ?Name: [name]  MRN: [id number] DOB: 29-Jan-1941 ?07/23/21 APPOINTMENT REMINDER ? ? ?Patient was reminded to have all medications, supplements and any blood glucose and blood pressure readings available for review with [name], Pharm. D, for telephone visit on 07/27/21 at 1. ? ? ? ?Care Gaps: ?COVID Booster - Overdue ?Flu Vaccine - Overdue ?BP- 121/64 ( 07/16/21) ?AWV- 11/22 ? ?Star Rating Drug: ?Benazepril (Lotensin) 20 mg - Last filled 03/21/21 90 DS at  [facility] ?Verified as accurate ? ?Medications: ?Outpatient Encounter Medications as of 07/23/2021  ?Medication Sig  ? alendronate (FOSAMAX) 70 MG tablet TAKE 1 TABLET BY MOUTH 1 TIME A WEEK WITH FULL GLASS OF WATER AND ON AN EMPTY STOMACH AS DIRECTED (Patient not taking: Reported on 06/08/2021)  ? benazepril (LOTENSIN) 20 MG tablet Take 20 mg by mouth daily.  ? butorphanol (STADOL) 10 MG/ML nasal spray Place 1 spray into the nose every 4 (four) hours as needed for headache (trigeminal neuralgia.).  ? Calcium Carb-Cholecalciferol (CALCIUM 600+D) 600-800 MG-UNIT TABS Take 1 tablet by mouth 2 (two) times daily.  ? carvedilol (COREG) 25 MG tablet TAKE 1 TABLET(25 MG) BY MOUTH TWICE DAILY WITH A MEAL  ? conjugated estrogens (PREMARIN) vaginal cream Place 1 applicator vaginally 2 (two) times a week.  ? fluticasone (FLONASE) 50 MCG/ACT nasal spray Place 2 sprays into both nostrils as needed for allergies or rhinitis.  ? furosemide (LASIX) 20 MG tablet TAKE 1 TABLET(20 MG) BY MOUTH DAILY  ? guaiFENesin-codeine 100-10 MG/5ML syrup TAKE 5 ML BY MOUTH EVERY 6 HOURS AS NEEDED FOR COUGH  ? lamoTRIgine (LAMICTAL) 100 MG tablet Take 100 mg by mouth 2 (two) times daily.  ? Lisinopril 1 MG/ML SOLN Take 20 mg  daily Per g tube (Patient not taking: Reported on 07/13/2021)  ? Multiple Vitamin (MULTIVITAMIN ADULT PO) Take 1 tablet by mouth daily.  ? Nutritional Supplements (NUTREN 1.5) LIQD 6 Cans by Enteral route 5  (five) times daily.  ? omeprazole (FIRST-OMEPRAZOLE) 2 mg/mL SUSP oral suspension Place 10 mLs (20 mg total) into feeding tube 2 (two) times daily before a meal.  ? omeprazole (PRILOSEC) 20 MG capsule Take 20 mg by mouth at bedtime.  ? ondansetron (ZOFRAN-ODT) 4 MG disintegrating tablet Take 1-2 tablets (4-8 mg total) by mouth every 8 (eight) hours as needed for nausea or vomiting.  ? OXcarbazepine (TRILEPTAL) 300 MG/5ML suspension Take 2.5 mLs (150 mg total) by mouth 2 (two) times daily.  ? Polyethyl Glycol-Propyl Glycol (SYSTANE) 0.4-0.3 % GEL ophthalmic gel Place 1 application into both eyes 2 (two) times daily as needed (dry eyes).  ? prochlorperazine (COMPAZINE) 10 MG tablet TAKE 1 TABLET(10 MG) BY MOUTH EVERY 6 HOURS AS NEEDED FOR NAUSEA OR VOMITING  ? sucralfate (CARAFATE) 1 GM/10ML suspension Take 10 mLs (1 g total) by mouth 2 (two) times daily.  ? ?No facility-administered encounter medications on file as of 07/23/2021.  ? ? ? ?Ned Clines CMA ?Clinical Pharmacist Assistant ?[phone] ? ?

## 2021-07-27 ENCOUNTER — Ambulatory Visit (INDEPENDENT_AMBULATORY_CARE_PROVIDER_SITE_OTHER): Payer: Medicare Other | Admitting: Pharmacist

## 2021-07-27 ENCOUNTER — Telehealth: Payer: Self-pay | Admitting: Pharmacist

## 2021-07-27 DIAGNOSIS — I10 Essential (primary) hypertension: Secondary | ICD-10-CM

## 2021-07-27 NOTE — Progress Notes (Signed)
Chronic Care Management Pharmacy Note  07/27/2021 Name:  [name] MRN:  [id number] DOB:  09/12/1940  Summary: Pt is not checking BP at home consistently   Recommendations/Changes made from today's visit: -Updated medication list to remove duplicate medications -Recommended for patient to take omeprazole twice daily as prescribed    Plan: BP assessment in 3 months Follow up in 3 months  Subjective: [name] is an 81 y.o. year old female who is a primary patient of [name], [name], MD.  The CCM team was consulted for assistance with disease management and care coordination needs.    Engaged with patient by telephone for follow up visit in response to provider referral for pharmacy case management and/or care coordination services.   Consent to Services:  The patient was given information about Chronic Care Management services, agreed to services, and gave verbal consent prior to initiation of services.  Please see initial visit note for detailed documentation.   Patient Care Team: [name], [name], MD as PCP - General [name], MD as PCP - Cardiology (Cardiology) Cindie Crumbly (Neurosurgery) Irine Seal, MD as Attending Physician (Urology) Erline Levine, MD as Consulting Physician (Neurosurgery) [name], MD as Consulting Physician (Neurology) Viona Gilmore, [facility] as Pharmacist (Pharmacist) [name], MD as Consulting Physician (Gastroenterology) Carol Ada, MD as Consulting Physician (Gastroenterology) [name], MD (Inactive) as Consulting Physician (Otolaryngology) [name], MD as Consulting Physician (Hematology) Kyung Rudd, MD as Consulting Physician (Radiation Oncology) Kyung Rudd, MD as Consulting Physician (Radiation Oncology)  Recent office visits: 04/12/21 [name], MD: Patient presented for video visit for pain.  04/06/21 Glenna Durand, LPN: Patient presented for AWV.  Recent consult  visits: 07/16/21 [name], MD (oncology): Patient presented for esophageal neoplasm follow up and Opdivo infusion. Prescribed Zofran PRN.  07/08/21 [name], PA-C (neurosurgery): Patient presented for trigeminal neuralgia follow up.  07/02/21 [name], MD (oncology): Patient presented for esophageal neoplasm follow up and Opdivo infusion. Prescribed pantoprazole 20 mg daily.  06/18/21 [name], MD (oncology): Patient presented for esophageal neoplasm follow up and Opdivo infusion.   06/17/21 [name], MD (ENT): Patient presented for initial consult for esophageal dysphagia. Referred to heat and neck/ENT.  06/08/21 [name], MD (cardiology): Patient presented for HTN follow up. Follow up in 1 year.  06/04/21 [name], MD (oncology): Patient presented for esophageal neoplasm follow up and Opdivo infusion.   05/21/21 [name], MD (oncology): Patient presented for esophageal neoplasm follow up and first Opdivo infusion.   05/13/21 Cira Rue, NP (oncology): Patient presented for esophageal neoplasm follow up. Plan to start Opdivo once patient recovers.  05/06/21 [name], PA-C (neurosurgery): Patient presented for trigeminal neuralgia follow up.  04/29/21 Cira Rue, NP (oncology): Patient presented for esophageal neoplasm follow up. Discussed Opdivo.  04/13/21 [name], MD (oncology): Patient presented for esophageal neoplasm follow up and paclitaxel and carboplatin infusion. Prescribed guaifenesin-codeine syrup for cough.  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 No medication changes.   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 No medication changes.   02/24/2021 [name] (General Surg) - Patient presented for Squamous cell carcinoma of esophagus.   02/18/2021 [name], NP (Oncology) - Patient presented for Malignant  neoplasm of upper third esophagus. No medication changes.   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 No medication changes.  10/15/20 Sarina Ill, MD (neurology): Patient presented for trigeminal neuralgia follow up. No medication changes.  Hospital visits: 07/19/21 Patient presented to the [facility] for upper endoscopy.  06/21/21 Patient presented to the [facility] for EGD.  Medication Reconciliation was completed by comparing discharge summary, patients EMR and Pharmacy list, and upon discussion with patient.   Patient presented to [facility] on [facility]-26-2021 for Malignant neoplasm of upper third esophagus. She was there for 33 hours   New?Medications Started at [facility] Discharge:?? -started Ultram 50 mg   Medication Changes at Hospital Discharge: -Changed  Timing of Zofran   Medications Discontinued at Hospital Discharge: -Stopped  None   Medications that remain the same after Hospital Discharge:??  -All other medications will remain the same.       Medication Reconciliation was completed by comparing discharge summary, patients EMR and Pharmacy list, and upon discussion with patient.   Patient presented to [facility] on 02-12-2021 for Direct Laryngoscopy and Biopsy Possible Frozen. Patient was there for 4 hours.   New?Medications Started at [facility] Discharge:?? -started None   Medication Changes at Hospital Discharge: -Changed None   Medications Discontinued at Hospital Discharge: -Stopped None   Medications that remain the same after Hospital Discharge:??  -All other medications will remain the same.    Objective:  Lab Results  Component Value Date   CREATININE 0.74 07/16/2021   BUN 25 (H) 07/16/2021   GFR 58.37 (L) 02/04/2021   GFRNONAA >60 07/16/2021   GFRAA 62 07/08/2020    NA 134 (L) 07/16/2021   K 4.5 07/16/2021   CALCIUM 9.5 07/16/2021   CO2 32 07/16/2021    Lab Results  Component Value Date/Time   HGBA1C 5.5 05/25/2018 10:52 AM   GFR 58.37 (L) 02/04/2021 03:31 PM   GFR 46.21 (L) 10/01/2020 10:00 AM    Last diabetic Eye exam: No results found for: HMDIABEYEEXA  Last diabetic Foot exam: No results found for: HMDIABFOOTEX   Lab Results  Component Value Date   CHOL 279 (H) 05/27/2019   HDL 118.80 05/27/2019   LDLCALC 145 (H) 05/27/2019   LDLDIRECT 135.9 10/16/2012   TRIG 78.0 05/27/2019   CHOLHDL 2 05/27/2019    Hepatic Function Latest Ref Rng & Units 07/16/2021 07/02/2021 06/18/2021  Total Protein 6.5 - 8.1 g/dL 6.1(L) 6.3(L) 6.2(L)  Albumin 3.5 - 5.0 g/dL 3.6 3.7 3.5  AST 15 - 41 U/L 17 18 20   ALT 0 - 44 U/L 13 15 17   Alk Phosphatase 38 - 126 U/L 80 79 76  Total Bilirubin 0.3 - 1.2 mg/dL 0.5 0.5 0.6  Bilirubin, Direct 0.0 - 0.3 mg/dL - - -    Lab Results  Component Value Date/Time   TSH 0.371 07/16/2021 01:45 PM   TSH 0.728 07/02/2021 01:33 PM   TSH 0.88 07/01/2020 02:49 PM   TSH 0.54 05/27/2019 10:45 AM   FREET4 0.74 05/30/2017 08:01 AM    CBC Latest Ref Rng & Units 07/16/2021 07/02/2021 06/18/2021  WBC 4.0 - 10.5 K/uL 4.5 4.3 4.2  Hemoglobin 12.0 - 15.0 g/dL 12.6 13.0 13.2  Hematocrit 36.0 - 46.0 % 37.9 39.9 39.1  Platelets 150 - 400 K/uL 161 174 155    Lab Results  Component Value Date/Time   VD25OH 19.97 (L) 05/25/2018 10:52 AM    Clinical ASCVD: No  The ASCVD Risk score (Arnett DK, et al., 2019) failed  to calculate for the following reasons:   The 2019 ASCVD risk score is only valid for ages 30 to 63    Depression screen PHQ 2/9 04/06/2021  Decreased Interest 0  Down, Depressed, Hopeless 0  PHQ - 2 Score 0  Some recent data might be hidden     Social History   Tobacco Use  Smoking Status Never  Smokeless Tobacco Never   BP Readings from Last 3 Encounters:  07/19/21 (!) 149/80  07/16/21 121/64  07/02/21 (!)  144/74   Pulse Readings from Last 3 Encounters:  07/19/21 68  07/16/21 60  07/02/21 61   Wt Readings from Last 3 Encounters:  07/19/21 130 lb 4.7 oz (59.1 kg)  07/16/21 130 lb 3 oz (59.1 kg)  07/02/21 129 lb 3 oz (58.6 kg)    Assessment/Interventions: Review of patient past medical history, allergies, medications, health status, including review of consultants reports, laboratory and other test data, was performed as part of comprehensive evaluation and provision of chronic care management services.   SDOH:  (Social Determinants of Health) assessments and interventions performed: No   CCM Care Plan  Allergies  Allergen Reactions   Hydrocodone Other (See Comments)    Rebound headaches   Sulfamethoxazole-Trimethoprim Hives and Itching    REACTION: unspecified    Medications Reviewed Today     Reviewed by Viona Gilmore, [facility] (Pharmacist) on 07/27/21 at 1503  Med List Status: <None>   Medication Order Taking? Sig Documenting Provider Last Dose Status Informant  benazepril (LOTENSIN) 20 MG tablet [id number] Yes Take 20 mg by mouth daily. [provider] Taking Active Self  butorphanol (STADOL) 10 MG/ML nasal spray [id number]  Place 1 spray into the nose every 4 (four) hours as needed for headache (trigeminal neuralgia.). Dohmeier, Asencion Partridge, MD  Active Self  Calcium Carb-Cholecalciferol (CALCIUM 600+D) 600-800 MG-UNIT TABS [id number]  Take 1 tablet by mouth 2 (two) times daily. [provider]  Active Self  carvedilol (COREG) 25 MG tablet [id number]  TAKE 1 TABLET(25 MG) BY MOUTH TWICE DAILY WITH A MEAL [name], [name], MD  Active   conjugated estrogens (PREMARIN) vaginal cream [id number]  Place 1 applicator vaginally 2 (two) times a week. [provider]  Active Self  fluticasone (FLONASE) 50 MCG/ACT nasal spray [id number]  Place 2 sprays into both nostrils as needed for allergies or rhinitis. [name], [name], MD  Active Self  furosemide (LASIX) 20 MG tablet  [id number]  TAKE 1 TABLET(20 MG) BY MOUTH DAILY [name], [name], MD  Active Self  guaiFENesin-codeine 100-10 MG/5ML syrup [id number]  TAKE 5 ML BY MOUTH EVERY 6 HOURS AS NEEDED FOR COUGH [name], MD  Active Self  lamoTRIgine (LAMICTAL) 100 MG tablet [id number]  Take 200 mg by mouth 2 (two) times daily. [provider]  Active Self  Multiple Vitamin (MULTIVITAMIN ADULT PO) [id number]  Take 1 tablet by mouth daily. [provider]  Active Self  Nutritional Supplements (NUTREN 1.5) LIQD [id number]  6 Cans by Enteral route 5 (five) times daily. [name], MD  Active Self  omeprazole (PRILOSEC) 20 MG capsule [id number] Yes Take 20 mg by mouth at bedtime. Should be twice a day [provider] Taking Active Self  ondansetron (ZOFRAN-ODT) 4 MG disintegrating tablet [id number]  Take 1-2 tablets (4-8 mg total) by mouth every 8 (eight) hours as needed for nausea or vomiting. [name], MD  Active   OXcarbazepine (TRILEPTAL) 150 MG tablet [id number] Yes Take 150 mg by mouth 2 (two)  times daily. [provider] Taking Active   Polyethyl Glycol-Propyl Glycol (SYSTANE) 0.4-0.3 % GEL ophthalmic gel [id number]  Place 1 application into both eyes 2 (two) times daily as needed (dry eyes). [provider]  Active Self  prochlorperazine (COMPAZINE) 10 MG tablet [id number]  TAKE 1 TABLET(10 MG) BY MOUTH EVERY 6 HOURS AS NEEDED FOR NAUSEA OR VOMITING [name], MD  Active Self  sucralfate (CARAFATE) 1 GM/10ML suspension [id number] Yes Take 10 mLs (1 g total) by mouth 2 (two) times daily. Mansouraty, Telford Nab., MD Taking Active             Patient Active Problem List   Diagnosis Date Noted   Protein-calorie malnutrition, severe 02/27/2021   Malignant neoplasm of upper third esophagus ([place]) 02/18/2021   Dysphagia 02/03/2021   Hematoma of abdominal wall, initial encounter 05/16/2020   Spondylosis without myelopathy or radiculopathy, lumbar region 03/27/2019   Radiculopathy due to  lumbar intervertebral disc disorder 03/27/2019   Spinal stenosis of lumbar region with neurogenic claudication 03/27/2019   Diarrhea 11/27/2017   Unilateral headache 05/26/2015   Nausea without vomiting 12/10/2014   Essential hypertension 05/29/2014   Post-traumatic headache 09/19/2013   Low back pain radiating to right leg 01/16/2013   Sinus problem 11/20/2012   Chronic headaches morning 10/19/2012   Leg pain, posterior 08/16/2012   Sciatic neuritis 08/16/2012   Sleep disturbance, unspecified 08/16/2012   Medication management 03/24/2012   Preventative health care 09/24/2011   Trigeminal neuralgia 09/24/2011   Postmenopausal HRT (hormone replacement therapy) 09/24/2011   Polycythemia 09/20/2011   Morning headache 02/01/2011   Hearing aid worn    LOCALIZED SUPERFICIAL SWELLING MASS OR LUMP 09/21/2009   Hypnic headache 03/19/2008   Cholelithiasis with chronic cholecystitis 03/19/2008   Headache(784.0) 06/05/2007   JAW PAIN 04/06/2007   Osteoarthritis 04/06/2007   HYPERLIPIDEMIA 12/25/2006   HYPERTENSION 12/25/2006    Immunization History  Administered Date(s) Administered   Fluad Quad(high Dose 65+) 02/18/2019, 03/17/2020   Influenza Split 03/10/2011, 02/02/2012   Influenza Whole 02/18/2008, 03/03/2010   Influenza, High Dose Seasonal PF 03/04/2014, 02/01/2016, 02/22/2017, 03/20/2018   Influenza,inj,Quad PF,6+ Mos 03/05/2013   PFIZER(Purple Top)SARS-COV-2 Vaccination 06/01/2019, 06/22/2019   Pneumococcal Conjugate-13 11/13/2013   Pneumococcal Polysaccharide-23 03/23/2015   Td 05/23/1996   Tdap 09/19/2011   Zoster Recombinat (Shingrix) 11/02/2019, 04/15/2020   Zoster, Live 09/19/2011   Spoke with patient's husband as he manages her medications and the patient was not feeling well and had just taken a nap. He reports her pain was not well covered with oxcarbazepine twice daily and increased to three times daily. Per dispense report, pharmacy filled lamotrigine 25 mg and 100  mg tablets in August but patient's husband confirmed he did not have any of he 25 mg tablets and patient is taking 1 of the 200 mg tablets BID (last filled 09/29/20) and 1/2 of the 100 mg tablet BID. Called to confirm with the pharmacy that the lamotrigine 25 mg have not been picked up since April 2022.  Conditions to be addressed/monitored:  Hypertension, Hyperlipidemia, GERD, Osteoporosis and migraines/trigeminal neuralgia, dyspnea, pain  Conditions addressed this visit: Hypertension, osteoporosis  Care Plan : [place]  Updates made by Viona Gilmore, [place] since 07/27/2021 12:00 AM     Problem: Problem: Hypertension, Hyperlipidemia, GERD, Osteoporosis and migraines/trigeminal neuralgia, dyspnea, pain      Long-Range Goal: Patient-Specific Goal   Start Date: 07/30/2020  Expected End Date: 07/30/2021  Recent Progress: On track  Priority: High  Note:   Current Barriers:  Unable to independently monitor therapeutic efficacy  Pharmacist Clinical Goal(s):  Patient will achieve adherence to monitoring guidelines and medication adherence to achieve therapeutic efficacy through collaboration with PharmD and provider.   Interventions: 1:1 collaboration with [name], [name], MD regarding development and update of comprehensive plan of care as evidenced by provider attestation and co-signature Inter-disciplinary care team collaboration (see longitudinal plan of care) Comprehensive medication review performed; medication list updated in electronic medical record  Hypertension (BP goal <140/90) -Uncontrolled -Current treatment: Benazepril 20 mg 1 tablet daily - Appropriate, Query effective, Safe, Accessible Carvedilol 25 mg 1 tablet twice daily - Appropriate, Query effective, Safe, Accessible  Furosemide 20 mg 1 tablet daily - Appropriate, Query effective, Safe, Accessible -Medications previously tried: clonidine (swelling) -Current home readings: not checking routinely at home;  only in office appts -Current dietary habits: eats some canned foods (chooses low sodium), eats some frozen foods - chips, hamburgers, sandwiches, sausage - recommended looking at package labels  -Current exercise habits: doing physical therapy exercises; twice a week soft exercise program through the TV -Denies hypotensive/hypertensive symptoms -Educated on Exercise goal of 150 minutes per week; Importance of home blood pressure monitoring; Proper BP monitoring technique; -Counseled to monitor BP at home at least weekly, document, and provide log at future appointments -Counseled on diet and exercise extensively Recommended to continue current medication  Hyperlipidemia: (LDL goal < 100) -Uncontrolled -Current treatment: No medications -Medications previously tried: none  -Current dietary patterns: did not discuss -Current exercise habits: doing physical therapy exercises; twice a week soft exercise program through the TV -Educated on Cholesterol goals;  Exercise goal of 150 minutes per week; -Counseled on diet and exercise extensively  Osteoporosis (Goal prevent fractures and improve bone density) -Controlled -Last DEXA Scan: 05/28/2018             T-Score femoral neck: R -1.3, L -2.0             T-Score total hip: n/a             T-Score lumbar spine: -0.9             T-Score forearm radius: n/a             10-year probability of major osteoporotic fracture: 21.4%             10-year probability of hip fracture: 6.0% -Patient is a candidate for pharmacologic treatment due to T-Score -1.0 to -2.5 and 10-year risk of major osteoporotic fracture > 20% and T-Score -1.0 to -2.5 and 10-year risk of hip fracture > 3% -Current treatment  Calcium 600 mg & 800 units of vitamin D twice daily -Medications previously tried: Alendronate (esophageal cancer) -Recommend [phone] units of vitamin D daily. Recommend 1200 mg of calcium daily from dietary and supplemental sources. Counseled on oral  bisphosphonate administration: take in the morning, 30 minutes prior to food with 6-8 oz of water. Do not lie down for at least 30 minutes after taking. Recommend weight-bearing and muscle strengthening exercises for building and maintaining bone density. -Recommended repeat vitamin D level and repeat DEXA   Migraines/trigeminal neuralgia (Goal: minimize pain and severity) -Controlled -Current treatment  Lamotrigine 200 mg 1 tablet twice daily - Appropriate, Effective, Safe, Accessible Lamotrigine 25 mg 2 tablets twice daily - Appropriate, Effective, Safe, Accessible Oxcarbazepine 150 mg 1 tablet twice daily - Appropriate, Effective, Safe, Accessible -Medications previously tried: none  -Recommended to continue current medication  GERD (Goal: minimize symptoms  of acid reflux/heartburn) -Controlled -Current treatment  Omeprazole 20 mg 1 capsule twice daily - taking once daily - Appropriate, Query effective, Safe, Accessible -Medications previously tried: none  -Recommended to continue current medication Recommended taking omeprazole as prescribed twice daily.  Pain (Goal: minimize pain) -Controlled -Current treatment  Tylenol 500 mg 2 tablets PRN -Medications previously tried: none  -Recommended to continue current medication   Health Maintenance -Vaccine gaps: COVID booster, second shingrix dose -Current therapy:  Ondansetron 4 mg ODT PRN Premarin vaginal cream 1 applicatorful twice weekly Systane eye drops PRN Flonase 2 sprays as needed - gets more headaches during allergy season Vitamin B (can't remember dose) -Educated on Cost vs benefit of each product must be carefully weighed by individual consumer -Patient is satisfied with current therapy and denies issues -Recommended to continue current medication  Patient Goals/Self-Care Activities Patient will:  - take medications as prescribed check blood pressure at least weekly, document, and provide at future  appointments  Follow Up Plan: Telephone follow up appointment with care management team member scheduled for: 6 months       Medication Assistance: None required.  Patient affirms current coverage meets needs.  Compliance/Adherence/Medication fill history: Care Gaps: COVID booster, influenza vaccine, shingrix (second dose) BP- 121/64 ( 07/16/21)   Star-Rating Drugs: Benazepril (Lotensin) 20 mg - Last filled 03/21/21 90 DS at  [facility]  Patient's preferred pharmacy is:  [facility] [place], [address] AT [address] [place] [address] Phone: [phone] Fax: [phone]   Uses pill box? Yes Pt endorses 100% compliance  We discussed: Current pharmacy is preferred with insurance plan and patient is satisfied with pharmacy services Patient decided to: Continue current medication management strategy  Care Plan and Follow Up Patient Decision:  Patient agrees to Care Plan and Follow-up.  Plan: Telephone follow up appointment with care management team member scheduled for:  6 months  [name], PharmD [place] Pharmacist [place] at [place] [phone]

## 2021-07-27 NOTE — Telephone Encounter (Signed)
?  Chronic Care Management  ? ?Outreach Note ? ?07/27/2021 ?Name: [name] MRN: [id number] DOB: 01/06/1941 ? ?Referred by: [name], MD ? ?Patient had a phone appointment scheduled with clinical pharmacist today. ? ?An unsuccessful telephone outreach was attempted today. The patient was referred to the pharmacist for assistance with care management and care coordination.  ? ?If possible, a message was left to return call to: [phone] or to [facility] at [place]: [phone] ? ?[name], PharmD, BCACP ?Clinical Pharmacist ?Therapist, music at [place] ?[phone] ?  ?

## 2021-07-27 NOTE — Patient Instructions (Signed)
Hi [name], ? ?It was great to catch up with you on the telephone again! Please don't forget to start taking omeprazole 20 mg twice daily as prescribed as we discussed. ? ?Please reach out to me if you have any questions or need anything before our follow up! ? ?Best, ?[name] ? ?[name], PharmD, BCACP ?Clinical Pharmacist ?Therapist, music at [place] ?[phone] ? ? Visit Information ? ? Goals Addressed   ?None ?  ? ?Patient Care Plan: [place]  ?  ? ?Problem Identified: Problem: Hypertension, Hyperlipidemia, GERD, Osteoporosis and migraines/trigeminal neuralgia, dyspnea, pain   ?  ? ?Long-Range Goal: Patient-Specific Goal   ?Start Date: 07/30/2020  ?Expected End Date: 07/30/2021  ?Recent Progress: On track  ?Priority: High  ?Note:   ?Current Barriers:  ?Unable to independently monitor therapeutic efficacy ? ?Pharmacist Clinical Goal(s):  ?Patient will achieve adherence to monitoring guidelines and medication adherence to achieve therapeutic efficacy through collaboration with PharmD and provider.  ? ?Interventions: ?1:1 collaboration with [name], [name], MD regarding development and update of comprehensive plan of care as evidenced by provider attestation and co-signature ?Inter-disciplinary care team collaboration (see longitudinal plan of care) ?Comprehensive medication review performed; medication list updated in electronic medical record ? ?Hypertension (BP goal <140/90) ?-Uncontrolled ?-Current treatment: ?Benazepril 20 mg 1 tablet daily - Appropriate, Query effective, Safe, Accessible ?Carvedilol 25 mg 1 tablet twice daily - Appropriate, Query effective, Safe, Accessible  ?Furosemide 20 mg 1 tablet daily - Appropriate, Query effective, Safe, Accessible ?-Medications previously tried: clonidine (swelling) ?-Current home readings: not checking routinely at home; only in office appts ?-Current dietary habits: eats some canned foods (chooses low sodium), eats some frozen foods - chips,  hamburgers, sandwiches, sausage - recommended looking at package labels  ?-Current exercise habits: doing physical therapy exercises; twice a week soft exercise program through the TV ?-Denies hypotensive/hypertensive symptoms ?-Educated on Exercise goal of 150 minutes per week; ?Importance of home blood pressure monitoring; ?Proper BP monitoring technique; ?-Counseled to monitor BP at home at least weekly, document, and provide log at future appointments ?-Counseled on diet and exercise extensively ?Recommended to continue current medication ? ?Hyperlipidemia: (LDL goal < 100) ?-Uncontrolled ?-Current treatment: ?No medications ?-Medications previously tried: none  ?-Current dietary patterns: did not discuss ?-Current exercise habits: doing physical therapy exercises; twice a week soft exercise program through the TV ?-Educated on Cholesterol goals;  ?Exercise goal of 150 minutes per week; ?-Counseled on diet and exercise extensively ? ?Osteoporosis (Goal prevent fractures and improve bone density) ?-Controlled ?-Last DEXA Scan: 05/28/2018 ?            T-Score femoral neck: R -1.3, L -2.0 ?            T-Score total hip: n/a ?            T-Score lumbar spine: -0.9 ?            T-Score forearm radius: n/a ?            10-year probability of major osteoporotic fracture: 21.4% ?            10-year probability of hip fracture: 6.0% ?-Patient is a candidate for pharmacologic treatment due to T-Score -1.0 to -2.5 and 10-year risk of major osteoporotic fracture > 20% and T-Score -1.0 to -2.5 and 10-year risk of hip fracture > 3% ?-Current treatment  ?Calcium 600 mg & 800 units of vitamin D twice daily ?-Medications previously tried: Alendronate (esophageal cancer) ?-Recommend [phone] units of  vitamin D daily. Recommend 1200 mg of calcium daily from dietary and supplemental sources. Counseled on oral bisphosphonate administration: take in the morning, 30 minutes prior to food with 6-8 oz of water. Do not lie down for at  least 30 minutes after taking. Recommend weight-bearing and muscle strengthening exercises for building and maintaining bone density. ?-Recommended repeat vitamin D level and repeat DEXA ? ? ?Migraines/trigeminal neuralgia (Goal: minimize pain and severity) ?-Controlled ?-Current treatment  ?Lamotrigine 200 mg 1 tablet twice daily - Appropriate, Effective, Safe, Accessible ?Lamotrigine 25 mg 2 tablets twice daily - Appropriate, Effective, Safe, Accessible ?Oxcarbazepine 150 mg 1 tablet twice daily - Appropriate, Effective, Safe, Accessible ?-Medications previously tried: none  ?-Recommended to continue current medication ? ?GERD (Goal: minimize symptoms of acid reflux/heartburn) ?-Controlled ?-Current treatment  ?Omeprazole 20 mg 1 capsule twice daily - taking once daily - Appropriate, Query effective, Safe, Accessible ?-Medications previously tried: none  ?-Recommended to continue current medication ?Recommended taking omeprazole as prescribed twice daily. ? ?Pain (Goal: minimize pain) ?-Controlled ?-Current treatment  ?Tylenol 500 mg 2 tablets PRN ?-Medications previously tried: none  ?-Recommended to continue current medication ? ? ?Health Maintenance ?-Vaccine gaps: COVID booster, second shingrix dose ?-Current therapy:  ?Ondansetron 4 mg ODT PRN ?Premarin vaginal cream 1 applicatorful twice weekly ?Systane eye drops PRN ?Flonase 2 sprays as needed - gets more headaches during allergy season ?Vitamin B (can't remember dose) ?-Educated on Cost vs benefit of each product must be carefully weighed by individual consumer ?-Patient is satisfied with current therapy and denies issues ?-Recommended to continue current medication ? ?Patient Goals/Self-Care Activities ?Patient will:  ?- take medications as prescribed ?check blood pressure at least weekly, document, and provide at future appointments ? ?Follow Up Plan: Telephone follow up appointment with care management team member scheduled for: 6 months ? ?  ?   ? ?Patient verbalizes understanding of instructions and care plan provided today and agrees to view in [place]. Active MyChart status confirmed with patient.   ?Telephone follow up appointment with pharmacy team member scheduled for: 6 months ? ?Viona Gilmore, RPH  ?

## 2021-07-28 ENCOUNTER — Encounter (HOSPITAL_COMMUNITY): Payer: Self-pay | Admitting: Gastroenterology

## 2021-07-28 DIAGNOSIS — Z1231 Encounter for screening mammogram for malignant neoplasm of breast: Secondary | ICD-10-CM | POA: Diagnosis not present

## 2021-07-28 LAB — HM MAMMOGRAPHY

## 2021-07-28 NOTE — Progress Notes (Signed)
Attempted to obtain medical history via telephone, unable to reach at this time. I left a voicemail to return pre surgical testing department's phone call.  

## 2021-07-29 DIAGNOSIS — C159 Malignant neoplasm of esophagus, unspecified: Secondary | ICD-10-CM | POA: Diagnosis not present

## 2021-07-29 DIAGNOSIS — R1310 Dysphagia, unspecified: Secondary | ICD-10-CM | POA: Diagnosis not present

## 2021-07-30 ENCOUNTER — Other Ambulatory Visit: Payer: Self-pay

## 2021-07-30 ENCOUNTER — Inpatient Hospital Stay (HOSPITAL_BASED_OUTPATIENT_CLINIC_OR_DEPARTMENT_OTHER): Payer: Medicare Other | Admitting: Hematology

## 2021-07-30 ENCOUNTER — Inpatient Hospital Stay: Payer: Medicare Other | Attending: Nurse Practitioner

## 2021-07-30 ENCOUNTER — Inpatient Hospital Stay: Payer: Medicare Other

## 2021-07-30 ENCOUNTER — Encounter: Payer: Self-pay | Admitting: Internal Medicine

## 2021-07-30 VITALS — BP 119/63 | HR 57 | Temp 97.7°F | Resp 18 | Wt 130.9 lb

## 2021-07-30 DIAGNOSIS — C153 Malignant neoplasm of upper third of esophagus: Secondary | ICD-10-CM | POA: Diagnosis not present

## 2021-07-30 DIAGNOSIS — Z79899 Other long term (current) drug therapy: Secondary | ICD-10-CM | POA: Insufficient documentation

## 2021-07-30 DIAGNOSIS — Z5112 Encounter for antineoplastic immunotherapy: Secondary | ICD-10-CM | POA: Diagnosis not present

## 2021-07-30 LAB — CBC WITH DIFFERENTIAL (CANCER CENTER ONLY)
Abs Immature Granulocytes: 0.01 10*3/uL (ref 0.00–0.07)
Basophils Absolute: 0 10*3/uL (ref 0.0–0.1)
Basophils Relative: 1 %
Eosinophils Absolute: 0.1 10*3/uL (ref 0.0–0.5)
Eosinophils Relative: 2 %
HCT: 40.5 % (ref 36.0–46.0)
Hemoglobin: 13.2 g/dL (ref 12.0–15.0)
Immature Granulocytes: 0 %
Lymphocytes Relative: 13 %
Lymphs Abs: 0.6 10*3/uL — ABNORMAL LOW (ref 0.7–4.0)
MCH: 29.6 pg (ref 26.0–34.0)
MCHC: 32.6 g/dL (ref 30.0–36.0)
MCV: 90.8 fL (ref 80.0–100.0)
Monocytes Absolute: 0.6 10*3/uL (ref 0.1–1.0)
Monocytes Relative: 14 %
Neutro Abs: 3 10*3/uL (ref 1.7–7.7)
Neutrophils Relative %: 70 %
Platelet Count: 169 10*3/uL (ref 150–400)
RBC: 4.46 MIL/uL (ref 3.87–5.11)
RDW: 13 % (ref 11.5–15.5)
WBC Count: 4.2 10*3/uL (ref 4.0–10.5)
nRBC: 0 % (ref 0.0–0.2)

## 2021-07-30 LAB — CMP (CANCER CENTER ONLY)
ALT: 15 U/L (ref 0–44)
AST: 18 U/L (ref 15–41)
Albumin: 3.7 g/dL (ref 3.5–5.0)
Alkaline Phosphatase: 91 U/L (ref 38–126)
Anion gap: 4 — ABNORMAL LOW (ref 5–15)
BUN: 21 mg/dL (ref 8–23)
CO2: 33 mmol/L — ABNORMAL HIGH (ref 22–32)
Calcium: 9.7 mg/dL (ref 8.9–10.3)
Chloride: 100 mmol/L (ref 98–111)
Creatinine: 0.76 mg/dL (ref 0.44–1.00)
GFR, Estimated: >60 mL/min (ref >60)
Glucose, Bld: 90 mg/dL (ref 70–99)
Potassium: 4.4 mmol/L (ref 3.5–5.1)
Sodium: 137 mmol/L (ref 135–145)
Total Bilirubin: 0.4 mg/dL (ref 0.3–1.2)
Total Protein: 6.2 g/dL — ABNORMAL LOW (ref 6.5–8.1)

## 2021-07-30 LAB — TSH: TSH: 0.489 u[IU]/mL (ref 0.308–3.960)

## 2021-07-30 MED ORDER — SODIUM CHLORIDE 0.9 % IV SOLN
480.0000 mg | Freq: Once | INTRAVENOUS | Status: AC
  Administered 2021-07-30: 480 mg via INTRAVENOUS
  Filled 2021-07-30: qty 48

## 2021-07-30 MED ORDER — SODIUM CHLORIDE 0.9 % IV SOLN: Freq: Once | INTRAVENOUS | Status: AC

## 2021-07-30 NOTE — Progress Notes (Signed)
[facility]   Telephone:([phone] Fax:(336) [phone]   Clinic Follow up Note   Patient Care Team: [name], [name], MD as PCP - General [name], MD as PCP - Cardiology (Cardiology) Cindie Crumbly (Neurosurgery) Irine Seal, MD as Attending Physician (Urology) Erline Levine, MD as Consulting Physician (Neurosurgery) [name], MD as Consulting Physician (Neurology) Viona Gilmore, [facility] as Pharmacist (Pharmacist) [name], MD as Consulting Physician (Gastroenterology) Carol Ada, MD as Consulting Physician (Gastroenterology) [name], MD (Inactive) as Consulting Physician (Otolaryngology) [name], MD as Consulting Physician (Hematology) Kyung Rudd, MD as Consulting Physician (Radiation Oncology) Kyung Rudd, MD as Consulting Physician (Radiation Oncology)  Date of Service:  07/30/2021  CHIEF COMPLAINT: f/u of esophageal cancer  CURRENT THERAPY:  Nivolumab q2weeks, started 05/21/21  ASSESSMENT & PLAN:  [name] is a 81 y.o. female with   1. Squamous cell carcinoma of cervical esophagus, cTxN2M0 -presented with dysphagia and weight loss. EGD 9/15 and CT neck 02/05/21 showed 6 cm mass in the cervical/upper esophagus with possible local adenopathy. Path confirmed squamous cell carcinoma of the esophagus, PDL1 testing 95% positive -She had feeding tube placed by Dr. Harlow Asa 02/22/21  -PET scan 02/23/21 shows hypermetabolic esophageal mass and hypermetabolic paratracheal nodes, negative for distant metastasis.  -due to the location in the cervical esophagus, Dr. Kipp Brood feels she is not a surgical candidate.  -she received concurrent chemoRT with carbo/taxol 10/17-11/23/22. She required dose reduction with last cycle due to radiation dermatitis, hoarseness, dysphagia, and odynophagia.  -given PDL1+, she began immunotherapy with Nivolumab on 05/21/21. She will receive it every 2 weeks for the first two months,  then move to every 4 weeks if she tolerates well. Will plan to give this for a total of a year if NED. -neck CT 06/18/21 showed decrease in size of mass-like enlargement of cervical esophagus and size of lymph nodes. -EGD on 06/21/21 by Dr. Carlean Purl showed only inflammation. Biopsy was negative for residual cancer. -chest CT 07/15/21 showed resolution of esophageal mass, otherwise NED. -labs reviewed, overall adequate to proceed with Nivo today. We will move to every 4 weeks after today's treatment.   2. Dysphagia, weight loss, cough -Secondary to #1 -She underwent G-tube placement by Dr. Harlow Asa on 02/22/21 -she is currently taking in nutrition only by feeding tube, 5 times a day. Weight overall stable. -neck CT 06/18/21 showed decrease in size of esophageal mass -she had EGD with Dr. Carlean Purl on 06/21/21 and 07/19/21 and underwent upper esophageal dilation. She notes she is able to swallow some now. She is scheduled for repeat dilation on 3/16. -she is able to drink liquid now    3. 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 Trigeminal neuralgia, sciatica, headaches -On lamotrigene and oxcarbazepine  -pain has worsened before and during treatment -f/up Dr. Jaynee Eagles    5. HTN -continue benazepril, furosemide - I reduced benazepril to once daily from 03/09/21, and let her know to hold lasix if she becomes dizzy at home   -monitoring closely    6. Goals of Care/Code Status -Code status was DNR from hospitalization 05/16/20, changed to full code during 02/26/21 hospitalization for feeding tube placement       PLAN: -proceed with Nivo today, will change to 417m today  -EGD 3/16 with Dr. GCarlean Purl-  lab, f/u, and Nivo in 4 weeks   No problem-specific Assessment & Plan notes found for  this encounter.   SUMMARY OF ONCOLOGIC HISTORY: Oncology History  Malignant neoplasm of upper third esophagus ([place])  12/21/2020 Imaging   Laryngoscopy Comments:    On fiberoptic laryngoscopy through the right nostril the nasopharynx was clear.  The base of tongue vallecula and epiglottis were normal.    Piriform sinuses were clear bilaterally and vocal cords were clear with  normal vocal mobility.  No structural abnormalities noted.   01/22/2021 Imaging   DG esophagus IMPRESSION: 1. Luminal narrowing in the high cervical esophagus over a 3 cm segment is highly concerning for esophageal neoplasm. Inflammatory process would be a secondary consideration. Luminal narrowing occurs approximately at the C5-C7 vertebral body level just below the glottis. Patient experienced several episodes of choking related to this luminal narrowing. Recommend expedient upper GI endoscopy for evaluation. 2. Distal thoracic esophagus and GE junction appear normal.   02/04/2021 Procedure   EGD by Dr. Carlean Purl impression- One mass-like severe stenosis was found in the upper third of the esophagus. The stenosis was not traversed.   02/05/2021 Imaging   CT soft tissue neck w contrast IMPRESSION: Mass-like soft tissue thickening and mucosal hyperenhancement of the cervical esophagus and hypopharynx, spanning the C4-T1 levels, likely reflecting an esophageal malignancy. This measures up to 2.4 x 3.4 cm in transaxial dimensions, and 6.3 cm in craniocaudal dimension. Associated severe effacement of the esophageal lumen.   Centrally necrotic right paratracheal lymph node beneath the level of the mass, measuring 1.3 x 0.9 cm, and likely reflecting a site of nodal metastatic disease.   Additional lymph nodes along the posterior and inferior aspect of the right thyroid lobe (along the right aspect of the esophageal mass), which measure subcentimeter but are asymmetrically prominent and highly suspicious for additional sites of nodal  metastatic disease.   02/12/2021 Pathology Results   FINAL MICROSCOPIC DIAGNOSIS:  A. ESOPHAGUS, UPPER CERVICAL #1, BIOPSY:  - Squamous cell carcinoma.  B. ESOPHAGUS, UPPER CERVICAL #2, BIOPSY:  - Squamous cell carcinoma.    02/12/2021 Cancer Staging   Staging form: Esophagus - Squamous Cell Carcinoma, AJCC 8th Edition - Clinical stage from 02/12/2021: Stage Unknown (cTX, cN2, cM0) - Signed by [name], MD on 03/08/2021 Stage prefix: Initial diagnosis    02/18/2021 Initial Diagnosis   Malignant neoplasm of upper third esophagus ([place])   02/23/2021 PET scan   IMPRESSION: Markedly hypermetabolic focal masslike thickening of the cervical esophagus, compatible with primary esophageal malignancy.   Markedly hypermetabolic right upper paratracheal lymph node, compatible with metastatic disease.   Small right upper paratracheal and right level IIA lymph nodes with mild hypermetabolic activity, concerning for additional sites of nodal metastatic disease.   Aortic Atherosclerosis (ICD10-I70.0).   03/09/2021 - 04/13/2021 Chemotherapy   Patient is on Treatment Plan : ESOPHAGUS Carboplatin/PACLitaxel weekly x 6 weeks with XRT       05/21/2021 -  Chemotherapy   Patient is on Treatment Plan : GASTROESOPHAGEAL Nivolumab q14d x 8 cycles / Nivolumab q28d        INTERVAL HISTORY:  [name] is here for a follow up of esophageal cancer. She was last seen by me on 07/16/21. She presents to the clinic accompanied by her husband.   She is doing better, she is able to drink liquid including coffee now, no spit of saliva. Still has cough, occasional productive, no fever or chest discomfort. Energy level overall improved, he is  able to do light activities at home and sometimes walks outside the house. She still sleeps 3 hour during day  No pain or other new complaints.    All other systems were reviewed with the patient and are negative.  MEDICAL HISTORY:  Past Medical History:   Diagnosis Date   Abdominal pain 05/29/2013   s/p rx of cephalo resistant e coli   but last rx NG  now residular ?  bladder sx repeat cx sx rx to ty and uro consult    ADJ DISORDER WITH MIXED ANXIETY & DEPRESSED MOOD 03/03/2010   Qualifier: Diagnosis of  By: [name], [name]    Agent resistant to multiple antibiotics 05/29/2013   e coli   bu NG on fu.     Anemia    Anxiety    ARF (acute renal failure) ([facility]) 03/12/2015   Closed head injury 02/01/2011   from syncope and had scalp laceration  neg ct .     Closed head injury 5-6 yrs ago   Colitis 55/37/4827   Complication of anesthesia    migraine several hours after general anesthesia   Depression    esophageal ca 01/2021   Fatty liver    Gall stones 2016   see ct scan neg HIDA    GERD (gastroesophageal reflux disease)    Hearing aid worn    HOH (hard of hearing)    both ears   Hyperlipidemia    Hypertension    echo nl lv function  mild dilitation 2009   Kidney infection    few yrs ago in hospital   Medication side effect 09/02/2010   Poss muscle se of 10 crestor    Migraine    hypnic HA eval by Dr. 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    Pyelonephritis 03/12/2015   Sensation of pain in anesthetized distribution of trigeminal nerve    Syncope 02/01/2011   In shower on vacation  sustained head laceration  8 sutures Had ed visit neg head ct labs and x ray    Trigeminal neuralgia pain     SURGICAL HISTORY: Past Surgical History:  Procedure Laterality Date   ABDOMINAL HYSTERECTOMY  2002   tubal   BACK SURGERY     2 times, for sciatic nerve pain   BALLOON DILATION  06/21/2021   Procedure: DILATION BALLOON used for esophageal stricture;  Surgeon: Irving Copas., MD;  Location: [facility];  Service: Gastroenterology;;   CARDIAC CATHETERIZATION  2000   chest pains neg   CHOLECYSTECTOMY N/A 02/21/2017   Procedure: LAPAROSCOPIC CHOLECYSTECTOMY WITH INTRAOPERATIVE CHOLANGIOGRAM;   Surgeon: Armandina Gemma, MD;  Location: [facility];  Service: General;  Laterality: N/A;   COLONOSCOPY     multiple   CRANIOTOMY  12/09/2011   nerve decompression right trigeminal    DIRECT LARYNGOSCOPY N/A 02/12/2021   Procedure: DIRECT LARYNGOSCOPY AND BIOPSY POSSIBLE FROZEN;  Surgeon: [name], MD;  Location: [facility];  Service: ENT;  Laterality: N/A;   DOPPLER ECHOCARDIOGRAPHY  2009   nl lv function mild lv dilitation   ESOPHAGOGASTRODUODENOSCOPY (EGD) WITH PROPOFOL N/A 06/21/2021   Procedure: ESOPHAGOGASTRODUODENOSCOPY (EGD) WITH PROPOFOL;  Surgeon: Irving Copas., MD;  Location: [facility];  Service: Gastroenterology;  Laterality: N/A;  Request Fluoroscopy; Plan in for dilation   ESOPHAGOGASTRODUODENOSCOPY (EGD) WITH PROPOFOL N/A 07/19/2021   Procedure: ESOPHAGOGASTRODUODENOSCOPY (EGD) WITH PROPOFOL;  Surgeon: Irving Copas., MD;  Location:  [facility];  Service: Endoscopy;  Laterality: N/A;  Dilation fluoro   EYE SURGERY Bilateral    ioc for catatracts   GASTROSTOMY N/A 02/26/2021   Procedure: OPEN GASTROSTOMY TUBE PLACEMENT;  Surgeon: Armandina Gemma, MD;  Location: [facility];  Service: General;  Laterality: N/A;   IR GASTROSTOMY TUBE MOD SED  02/22/2021   laparoscopic gallbladder surgery  02/16/2017   Fax from [facility] Bilateral 2002   rt shoulder surgery     SAVORY DILATION N/A 06/21/2021   Procedure: SAVORY DILATION;  Surgeon: Irving Copas., MD;  Location: [facility];  Service: Gastroenterology;  Laterality: N/A;   SAVORY DILATION N/A 07/19/2021   Procedure: SAVORY DILATION;  Surgeon: Rush Landmark Telford Nab., MD;  Location: [facility];  Service: Endoscopy;  Laterality: N/A;    I have reviewed the social history and family history with the patient and they are unchanged from previous note.  ALLERGIES:  is allergic to hydrocodone and sulfamethoxazole-trimethoprim.  MEDICATIONS:  Current Outpatient  Medications  Medication Sig Dispense Refill   benazepril (LOTENSIN) 20 MG tablet Take 20 mg by mouth daily.     butorphanol (STADOL) 10 MG/ML nasal spray Place 1 spray into the nose every 4 (four) hours as needed for headache (trigeminal neuralgia.). 2.5 mL 0   Calcium Carb-Cholecalciferol (CALCIUM 600+D) 600-800 MG-UNIT TABS Take 1 tablet by mouth 2 (two) times daily.     carvedilol (COREG) 25 MG tablet TAKE 1 TABLET(25 MG) BY MOUTH TWICE DAILY WITH A MEAL 60 tablet 3   conjugated estrogens (PREMARIN) vaginal cream Place 1 applicator vaginally 2 (two) times a week.     fluticasone (FLONASE) 50 MCG/ACT nasal spray Place 2 sprays into both nostrils as needed for allergies or rhinitis. 16 g 5   furosemide (LASIX) 20 MG tablet TAKE 1 TABLET(20 MG) BY MOUTH DAILY 90 tablet 0   guaiFENesin-codeine 100-10 MG/5ML syrup TAKE 5 ML BY MOUTH EVERY 6 HOURS AS NEEDED FOR COUGH 240 mL 0   lamoTRIgine (LAMICTAL) 100 MG tablet Take 200 mg by mouth 2 (two) times daily.     lamoTRIgine (LAMICTAL) 25 MG tablet Take 50 mg by mouth 2 (two) times daily.     Multiple Vitamin (MULTIVITAMIN ADULT PO) Take 1 tablet by mouth daily.     Nutritional Supplements (NUTREN 1.5) LIQD 6 Cans by Enteral route 5 (five) times daily. 1500 mL 6   omeprazole (PRILOSEC) 20 MG capsule Take 20 mg by mouth at bedtime. Should be twice a day     ondansetron (ZOFRAN-ODT) 4 MG disintegrating tablet Take 1-2 tablets (4-8 mg total) by mouth every 8 (eight) hours as needed for nausea or vomiting. 30 tablet 1   OXcarbazepine (TRILEPTAL) 150 MG tablet Take 150 mg by mouth 2 (two) times daily.     Polyethyl Glycol-Propyl Glycol (SYSTANE) 0.4-0.3 % GEL ophthalmic gel Place 1 application into both eyes 2 (two) times daily as needed (dry eyes).     prochlorperazine (COMPAZINE) 10 MG tablet TAKE 1 TABLET(10 MG) BY MOUTH EVERY 6 HOURS AS NEEDED FOR NAUSEA OR VOMITING 30 tablet 1   sucralfate (CARAFATE) 1 GM/10ML suspension Take 10 mLs (1 g total) by mouth  2 (two) times daily. 420 mL 1   No current facility-administered medications for this visit.    PHYSICAL EXAMINATION: ECOG PERFORMANCE STATUS: 2 - Symptomatic, <50% confined to bed  There were no vitals filed for this visit. Wt Readings from Last 3 Encounters:  07/19/21 130 lb 4.7  oz (59.1 kg)  07/16/21 130 lb 3 oz (59.1 kg)  07/02/21 129 lb 3 oz (58.6 kg)     GENERAL:alert, no distress and comfortable SKIN: skin color, texture, turgor are normal, no rashes or significant lesions EYES: normal, Conjunctiva are pink and non-injected, sclera clear Musculoskeletal:no cyanosis of digits and no clubbing  NEURO: alert & oriented x 3 with fluent speech, no focal motor/sensory deficits  LABORATORY DATA:  I have reviewed the data as listed CBC Latest Ref Rng & Units 07/16/2021 07/02/2021 06/18/2021  WBC 4.0 - 10.5 K/uL 4.5 4.3 4.2  Hemoglobin 12.0 - 15.0 g/dL 12.6 13.0 13.2  Hematocrit 36.0 - 46.0 % 37.9 39.9 39.1  Platelets 150 - 400 K/uL 161 174 155     CMP Latest Ref Rng & Units 07/16/2021 07/02/2021 06/18/2021  Glucose 70 - 99 mg/dL 134(H) 112(H) 206(H)  BUN 8 - 23 mg/dL 25(H) 25(H) 23  Creatinine 0.44 - 1.00 mg/dL 0.74 0.68 0.63  Sodium 135 - 145 mmol/L 134(L) 134(L) 131(L)  Potassium 3.5 - 5.1 mmol/L 4.5 4.4 4.4  Chloride 98 - 111 mmol/L 99 99 96(L)  CO2 22 - 32 mmol/L 32 31 31  Calcium 8.9 - 10.3 mg/dL 9.5 9.5 9.3  Total Protein 6.5 - 8.1 g/dL 6.1(L) 6.3(L) 6.2(L)  Total Bilirubin 0.3 - 1.2 mg/dL 0.5 0.5 0.6  Alkaline Phos 38 - 126 U/L 80 79 76  AST 15 - 41 U/L _0 ALT 0 - 44 U/L _1 RADIOGRAPHIC STUDIES: I have personally reviewed the radiological images as listed and agreed with the findings in the report. No results found.    No orders of the defined types were placed in this encounter.  All questions were answered. The patient knows to call the clinic with any problems, questions or concerns. No barriers to learning was detected. The total time  spent in the appointment was 30 minutes.     [name], MD 07/30/2021   I, Wilburn Mylar, am acting as scribe for [name], MD.   I have reviewed the above documentation for accuracy and completeness, and I agree with the above.

## 2021-07-30 NOTE — Patient Instructions (Signed)
[facility]  Discharge Instructions: ?Thank you for choosing [facility] to provide your oncology and hematology care.  ? ?If you have a lab appointment with the [facility], please go directly to the [facility] and check in at the registration area. ?  ?Wear comfortable clothing and clothing appropriate for easy access to any Portacath or PICC line.  ? ?We strive to give you quality time with your provider. You may need to reschedule your appointment if you arrive late (15 or more minutes).  Arriving late affects you and other patients whose appointments are after yours.  Also, if you miss three or more appointments without notifying the office, you may be dismissed from the clinic at the provider?s discretion.    ?  ?For prescription refill requests, have your pharmacy contact our office and allow 72 hours for refills to be completed.   ? ?Today you received the following chemotherapy and/or immunotherapy agents: Opdivo.    ?  ?To help prevent nausea and vomiting after your treatment, we encourage you to take your nausea medication as directed. ? ?BELOW ARE SYMPTOMS THAT SHOULD BE REPORTED IMMEDIATELY: ?*FEVER GREATER THAN 100.4 F (38 ?C) OR HIGHER ?*CHILLS OR SWEATING ?*NAUSEA AND VOMITING THAT IS NOT CONTROLLED WITH YOUR NAUSEA MEDICATION ?*UNUSUAL SHORTNESS OF BREATH ?*UNUSUAL BRUISING OR BLEEDING ?*URINARY PROBLEMS (pain or burning when urinating, or frequent urination) ?*BOWEL PROBLEMS (unusual diarrhea, constipation, pain near the anus) ?TENDERNESS IN MOUTH AND THROAT WITH OR WITHOUT PRESENCE OF ULCERS (sore throat, sores in mouth, or a toothache) ?UNUSUAL RASH, SWELLING OR PAIN  ?UNUSUAL VAGINAL DISCHARGE OR ITCHING  ? ?Items with * indicate a potential emergency and should be followed up as soon as possible or go to the Emergency Department if any problems should occur. ? ?Please show the CHEMOTHERAPY ALERT CARD or IMMUNOTHERAPY ALERT CARD at check-in to  the Emergency Department and triage nurse. ? ?Should you have questions after your visit or need to cancel or reschedule your appointment, please contact [facility]  Dept: [phone]  and follow the prompts.  Office hours are 8:00 a.m. to 4:30 p.m. Monday - Friday. Please note that voicemails left after 4:00 p.m. may not be returned until the following business day.  We are closed weekends and major holidays. You have access to a nurse at all times for urgent questions. Please call the main number to the clinic Dept: [phone] and follow the prompts. ? ? ?For any non-urgent questions, you may also contact your provider using MyChart. We now offer e-Visits for anyone 21 and older to request care online for non-urgent symptoms. For details visit [email or web address]. ?  ?Also download the MyChart app! Go to the app store, search "MyChart", open the app, select [facility], and log in with your MyChart username and password. ? ?Due to Covid, a mask is required upon entering the hospital/clinic. If you do not have a mask, one will be given to you upon arrival. For doctor visits, patients may have 1 support person aged 66 or older with them. For treatment visits, patients cannot have anyone with them due to current Covid guidelines and our immunocompromised population.  ? ?

## 2021-07-31 ENCOUNTER — Encounter: Payer: Self-pay | Admitting: Nurse Practitioner

## 2021-07-31 ENCOUNTER — Encounter: Payer: Self-pay | Admitting: Hematology

## 2021-07-31 LAB — T4: T4, Total: 7.3 ug/dL (ref 4.5–12.0)

## 2021-08-05 ENCOUNTER — Ambulatory Visit (HOSPITAL_COMMUNITY): Payer: Medicare Other

## 2021-08-05 ENCOUNTER — Ambulatory Visit (HOSPITAL_BASED_OUTPATIENT_CLINIC_OR_DEPARTMENT_OTHER): Payer: Medicare Other | Admitting: Anesthesiology

## 2021-08-05 ENCOUNTER — Ambulatory Visit (HOSPITAL_COMMUNITY): Payer: Medicare Other | Admitting: Anesthesiology

## 2021-08-05 ENCOUNTER — Encounter (HOSPITAL_COMMUNITY)
Admission: RE | Disposition: A | Discharge status: Home / Self Care | Payer: Self-pay | Source: Home / Self Care | Attending: Gastroenterology

## 2021-08-05 ENCOUNTER — Ambulatory Visit (HOSPITAL_COMMUNITY)
Admission: RE | Admit: 2021-08-05 | Discharge: 2021-08-05 | Disposition: A | Discharge status: Home / Self Care | Payer: Medicare Other | Attending: Gastroenterology | Admitting: Gastroenterology

## 2021-08-05 ENCOUNTER — Encounter (HOSPITAL_COMMUNITY): Payer: Self-pay | Admitting: Gastroenterology

## 2021-08-05 DIAGNOSIS — F32A Depression, unspecified: Secondary | ICD-10-CM | POA: Diagnosis not present

## 2021-08-05 DIAGNOSIS — K222 Esophageal obstruction: Secondary | ICD-10-CM

## 2021-08-05 DIAGNOSIS — I10 Essential (primary) hypertension: Secondary | ICD-10-CM | POA: Diagnosis not present

## 2021-08-05 DIAGNOSIS — Z9221 Personal history of antineoplastic chemotherapy: Secondary | ICD-10-CM | POA: Insufficient documentation

## 2021-08-05 DIAGNOSIS — K219 Gastro-esophageal reflux disease without esophagitis: Secondary | ICD-10-CM | POA: Diagnosis not present

## 2021-08-05 DIAGNOSIS — Z13810 Encounter for screening for upper gastrointestinal disorder: Secondary | ICD-10-CM

## 2021-08-05 DIAGNOSIS — G500 Trigeminal neuralgia: Secondary | ICD-10-CM | POA: Diagnosis not present

## 2021-08-05 DIAGNOSIS — K9423 Gastrostomy malfunction: Secondary | ICD-10-CM | POA: Diagnosis not present

## 2021-08-05 DIAGNOSIS — R1310 Dysphagia, unspecified: Secondary | ICD-10-CM

## 2021-08-05 DIAGNOSIS — Z8501 Personal history of malignant neoplasm of esophagus: Secondary | ICD-10-CM | POA: Insufficient documentation

## 2021-08-05 DIAGNOSIS — F419 Anxiety disorder, unspecified: Secondary | ICD-10-CM | POA: Diagnosis not present

## 2021-08-05 DIAGNOSIS — Z923 Personal history of irradiation: Secondary | ICD-10-CM | POA: Diagnosis not present

## 2021-08-05 DIAGNOSIS — F418 Other specified anxiety disorders: Secondary | ICD-10-CM | POA: Diagnosis not present

## 2021-08-05 HISTORY — PX: SAVORY DILATION: SHX5439

## 2021-08-05 HISTORY — PX: ESOPHAGOGASTRODUODENOSCOPY (EGD) WITH PROPOFOL: SHX5813

## 2021-08-05 SURGERY — ESOPHAGOGASTRODUODENOSCOPY (EGD) WITH PROPOFOL
Anesthesia: Monitor Anesthesia Care

## 2021-08-05 MED ORDER — EPHEDRINE SULFATE-NACL 50-0.9 MG/10ML-% IV SOSY
PREFILLED_SYRINGE | INTRAVENOUS | Status: DC | PRN
  Administered 2021-08-05: 5 mg via INTRAVENOUS
  Administered 2021-08-05: 10 mg via INTRAVENOUS

## 2021-08-05 MED ORDER — SUCRALFATE 1 GM/10ML PO SUSP: 1.0000 g | Freq: Four times a day (QID) | ORAL | 6 refills | Status: DC

## 2021-08-05 MED ORDER — FENTANYL CITRATE (PF) 100 MCG/2ML IJ SOLN
INTRAMUSCULAR | Status: AC
  Filled 2021-08-05: qty 2

## 2021-08-05 MED ORDER — PHENYLEPHRINE 40 MCG/ML (10ML) SYRINGE FOR IV PUSH (FOR BLOOD PRESSURE SUPPORT)
PREFILLED_SYRINGE | INTRAVENOUS | Status: DC | PRN
Start: 2021-08-05 — End: 2021-08-05

## 2021-08-05 MED ORDER — SUCCINYLCHOLINE CHLORIDE 200 MG/10ML IV SOSY
PREFILLED_SYRINGE | INTRAVENOUS | Status: DC | PRN
  Administered 2021-08-05: 120 mg via INTRAVENOUS

## 2021-08-05 MED ORDER — DEXAMETHASONE SODIUM PHOSPHATE 10 MG/ML IJ SOLN
INTRAMUSCULAR | Status: DC | PRN
  Administered 2021-08-05: 5 mg via INTRAVENOUS

## 2021-08-05 MED ORDER — GLYCOPYRROLATE PF 0.2 MG/ML IJ SOSY
PREFILLED_SYRINGE | INTRAMUSCULAR | Status: DC | PRN
  Administered 2021-08-05: .2 mg via INTRAVENOUS

## 2021-08-05 MED ORDER — LACTATED RINGERS IV SOLN: INTRAVENOUS | Status: DC | PRN

## 2021-08-05 MED ORDER — LIDOCAINE 2% (20 MG/ML) 5 ML SYRINGE
INTRAMUSCULAR | Status: DC | PRN
  Administered 2021-08-05: 60 mg via INTRAVENOUS

## 2021-08-05 MED ORDER — ONDANSETRON HCL 4 MG/2ML IJ SOLN
INTRAMUSCULAR | Status: DC | PRN
  Administered 2021-08-05: 4 mg via INTRAVENOUS

## 2021-08-05 MED ORDER — PHENYLEPHRINE HCL-NACL 20-0.9 MG/250ML-% IV SOLN
INTRAVENOUS | Status: DC | PRN
  Administered 2021-08-05: 25 ug/min via INTRAVENOUS

## 2021-08-05 MED ORDER — PROPOFOL 10 MG/ML IV BOLUS
INTRAVENOUS | Status: DC | PRN
  Administered 2021-08-05: 140 mg via INTRAVENOUS

## 2021-08-05 MED ORDER — FENTANYL CITRATE (PF) 100 MCG/2ML IJ SOLN
INTRAMUSCULAR | Status: DC | PRN
Start: 2021-08-05 — End: 2021-08-05
  Administered 2021-08-05 (×2): 50 ug via INTRAVENOUS

## 2021-08-05 SURGICAL SUPPLY — 15 items

## 2021-08-05 NOTE — Anesthesia Procedure Notes (Signed)
Procedure Name: Intubation ?Date/Time: 08/05/2021 12:23 PM ?Performed by: Dorann Lodge, CRNA ?Pre-anesthesia Checklist: Patient identified, Emergency Drugs available, Suction available and Patient being monitored ?Patient Re-evaluated:Patient Re-evaluated prior to induction ?Oxygen Delivery Method: Circle System Utilized ?Preoxygenation: Pre-oxygenation with 100% oxygen ?Induction Type: IV induction ?Ventilation: Mask ventilation without difficulty ?Laryngoscope Size: Mac and 3 ?Tube type: Oral ?Tube size: 7.0 mm ?Number of attempts: 1 ?Airway Equipment and Method: Stylet and Oral airway ?Placement Confirmation: ETT inserted through vocal cords under direct vision, positive ETCO2 and breath sounds checked- equal and bilateral ?Secured at: 22 cm ?Tube secured with: Tape ?Dental Injury: Teeth and Oropharynx as per pre-operative assessment  ? ? ? ? ?

## 2021-08-05 NOTE — Progress Notes (Signed)
Pt requesting home medications for trigeminal neuralgia prior to procedure. Pt's husband brought medications from home and gave them to patient via peg tube. MD Heloise Beecham and MD Mansouraty aware.  ?

## 2021-08-05 NOTE — H&P (Signed)
? ?GASTROENTEROLOGY PROCEDURE H&P NOTE  ? ?Primary Care Physician: ?[name], MD ? ?HPI: ?[name] is a 81 y.o. 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 ? ?Past Medical History:  ?Diagnosis Date  ? Abdominal pain 05/29/2013  ? s/p rx of cephalo resistant e coli   but last rx NG  now residular ?  bladder sx repeat cx sx rx to ty and uro consult   ? 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 Qualifier: Diagnosis of  By: [name], [name]   ? Agent resistant to multiple antibiotics 05/29/2013  ? e coli   bu NG on fu.    ? Anemia   ? Anxiety   ? ARF (acute renal failure) ([facility]) 03/12/2015  ? Closed head injury 02/01/2011  ? from syncope and had scalp laceration  neg ct .    ? Closed head injury 5-6 yrs ago  ? Colitis 11/27/2017  ? Complication of anesthesia   ? migraine several hours after general anesthesia  ? Depression   ? esophageal ca 01/2021  ? Fatty liver   ? Gall stones 2016  ? see ct scan neg [facility]   ? GERD (gastroesophageal reflux disease)   ? Hearing aid worn   ? 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 Hyperlipidemia   ? Hypertension   ? echo nl lv function  mild dilitation 2009  ? Kidney infection   ? few yrs ago in hospital  ? Medication side effect 09/02/2010  ? Poss muscle se of 10 crestor   ? Migraine   ? hypnic HA eval by Dr. Earley Favor in the past  ? Polycythemia   ? 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 Pyelonephritis 03/12/2015  ? Sensation of pain in anesthetized distribution of trigeminal nerve   ? Syncope 02/01/2011  ? In shower on vacation  sustained head laceration  8 sutures Had ed visit neg head ct labs and x ray   ? Trigeminal neuralgia pain   ? ?Past Surgical History:  ?Procedure Laterality Date  ? ABDOMINAL HYSTERECTOMY  2002  ? tubal  ? BACK SURGERY    ? 2 times, for sciatic nerve pain  ? BALLOON DILATION  06/21/2021  ? Procedure: DILATION BALLOON used  for esophageal stricture;  Surgeon: Rush Landmark Telford Nab., MD;  Location: [facility];  Service: Gastroenterology;;  ? CARDIAC CATHETERIZATION  2000  ? chest pains neg  ? CHOLECYSTECTOMY N/A 02/21/2017  ? Procedure: LAPAROSCOPIC CHOLECYSTECTOMY WITH INTRAOPERATIVE CHOLANGIOGRAM;  Surgeon: Armandina Gemma, MD;  Location: [facility];  Service: General;  Laterality: N/A;  ? COLONOSCOPY    ? multiple  ? CRANIOTOMY  12/09/2011  ? nerve decompression right trigeminal   ? DIRECT LARYNGOSCOPY N/A 02/12/2021  ? Procedure: DIRECT LARYNGOSCOPY AND BIOPSY POSSIBLE FROZEN;  Surgeon: [name], MD;  Location: [facility];  Service: ENT;  Laterality: N/A;  ? DOPPLER ECHOCARDIOGRAPHY  2009  ? nl lv function mild lv dilitation  ? ESOPHAGOGASTRODUODENOSCOPY (EGD) WITH PROPOFOL N/A 06/21/2021  ? Procedure: ESOPHAGOGASTRODUODENOSCOPY (EGD) WITH PROPOFOL;  Surgeon: Rush Landmark Telford Nab., MD;  Location: [facility];  Service: Gastroenterology;  Laterality: N/A;  Request Fluoroscopy; Plan in for dilation  ? ESOPHAGOGASTRODUODENOSCOPY (EGD) WITH PROPOFOL N/A 07/19/2021  ? Procedure: ESOPHAGOGASTRODUODENOSCOPY (EGD) WITH PROPOFOL;  Surgeon: Rush Landmark Telford Nab., MD;  Location: [facility];  Service: Endoscopy;  Laterality: N/A;  Dilation fluoro  ? EYE SURGERY Bilateral   ? ioc for catatracts  ? GASTROSTOMY N/A 02/26/2021  ? Procedure: OPEN GASTROSTOMY TUBE PLACEMENT;  Surgeon: Armandina Gemma, MD;  Location: [facility];  Service: General;  Laterality: N/A;  ? IR GASTROSTOMY TUBE MOD SED  02/22/2021  ? laparoscopic gallbladder surgery  02/16/2017  ? Fax from [facility]  ? OOPHORECTOMY Bilateral 2002  ? rt shoulder surgery    ? SAVORY DILATION N/A 06/21/2021  ? Procedure: SAVORY DILATION;  Surgeon: Irving Copas., MD;  Location: [facility];  Service: Gastroenterology;  Laterality: N/A;  ? SAVORY DILATION N/A 07/19/2021  ? Procedure: SAVORY DILATION;  Surgeon: Irving Copas., MD;  Location: [facility];  Service: Endoscopy;  Laterality: N/A;  ? ?No current facility-administered medications for this encounter.  ? ?No current facility-administered medications for this encounter. ?Allergies  ?Allergen Reactions  ? Hydrocodone Other (See Comments)  ?  Rebound headaches  ? Sulfamethoxazole-Trimethoprim Hives and Itching  ?  REACTION: unspecified  ? ?Family History  ?Problem Relation Age of Onset  ? Cancer Mother   ?     uterine  ? Ovarian cancer Mother   ? Stroke Mother   ? Alcohol abuse Father   ? Stroke Father   ? Diabetes Brother   ? Cancer Paternal Aunt   ?     leukemia, unknown type  ? Seizures Daughter   ? Hypertension Other   ? Colon cancer Neg Hx   ? Dementia Neg Hx   ? Alzheimer's disease Neg Hx   ? ?Social History  ? ?Socioeconomic History  ? Marital status: Married  ?  Spouse name: Not on file  ? Number of children: 2  ? Years of education: Not on file  ? Highest education level: Not on file  ?Occupational History  ?  Comment: retired Forensic psychologist  ?Tobacco Use  ? Smoking status: Never  ? Smokeless tobacco: Never  ?Vaping Use  ? Vaping Use: Never used  ?Substance and Sexual Activity  ? Alcohol use: Not Currently  ?  Alcohol/week: 2.0 standard drinks  ?  Types: 2 Glasses of wine per week  ?  Comment: occ wine  ? Drug use: No  ? Sexual activity: Not on file  ?Other Topics Concern  ? Not on file  ?Social History Narrative  ? Married  ? HH of 2-3 (god daughter)  ? Pets 2 dogs  ? Non smoker   ? Child is a physician  ? G2P2  ?   ? Caffeine: 2 cups/day  ? ?Social Determinants of Health  ? ?Financial Resource Strain: Low Risk   ? Difficulty of Paying Living Expenses: Not hard at all  ?Food Insecurity: No Food Insecurity  ? Worried About Charity fundraiser in the Last Year: Never true  ? Ran Out of Food in the Last Year: Never true  ?Transportation Needs: No Transportation Needs  ? Lack of Transportation (Medical): No  ? Lack of Transportation (Non-Medical): No  ?Physical Activity: Sufficiently Active   ? Days of Exercise per Week: 2 days  ? Minutes of Exercise per Session: 150+ min  ?Stress: No Stress Concern Present  ? Feeling of Stress : Not at all  ?Social Connections: Not on file  ?Intimate Partner Violence: Not on file  ? ? ?Physical Exam: ?There were no vitals filed for this visit. ?There is no height or weight on file to calculate BMI. ?GEN:  NAD ?EYE: Sclerae anicteric ?ENT: MMM ?CV: Non-tachycardic ?GI: Soft, NT/ND ?NEURO:  Alert & Oriented x 3 ? ?Lab Results: ?No results for input(s): WBC, HGB, HCT, PLT in the last 72 hours. ?BMET ?No results for input(s): NA, K, CL, CO2, GLUCOSE, BUN, CREATININE, CALCIUM in the last 72 hours. ?LFT ?No results for input(s): PROT, ALBUMIN, AST, ALT, ALKPHOS, BILITOT, BILIDIR, IBILI in the last 72 hours. ?PT/INR ?No results for input(s): LABPROT, INR in the last 72 hours. ? ? 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 ? ?The risks and benefits of endoscopic evaluation/treatment were discussed with the patient and/or family; these include but are not limited to the risk of perforation, infection, bleeding, missed lesions, lack of diagnosis, severe illness requiring hospitalization, as well as anesthesia and sedation related illnesses.  The patient's history has been reviewed, patient examined, no change in status, and deemed stable for procedure.  The patient and/or family is agreeable to proceed.  ? ? ?Justice Britain, MD ?[facility] ?Advanced Endoscopy ?Office # [phone] ? ?

## 2021-08-05 NOTE — Transfer of Care (Signed)
Immediate Anesthesia Transfer of Care Note ? ?Patient: [name] ? ?Procedure(s) Performed: ESOPHAGOGASTRODUODENOSCOPY (EGD) WITH PROPOFOL - fluoro ?SAVORY DILATION ? ?Patient Location: PACU ? ?Anesthesia Type:General ? ?Level of Consciousness: awake and alert  ? ?Airway & Oxygen Therapy: Patient Spontanous Breathing and Patient connected to face mask oxygen ? ?Post-op Assessment: Report given to RN and Post -op Vital signs reviewed and stable ? ?Post vital signs: Reviewed and stable ? ?Last Vitals:  ?Vitals Value Taken Time  ?BP 179/92 08/05/21 1330  ?Temp    ?Pulse 59 08/05/21 1333  ?Resp 13 08/05/21 1333  ?SpO2 100 % 08/05/21 1333  ?Vitals shown include unvalidated device data. ? ?Last Pain: There were no vitals filed for this visit.   ? ?  ? ?Complications: No notable events documented. ?

## 2021-08-05 NOTE — Anesthesia Preprocedure Evaluation (Addendum)
Anesthesia Evaluation  ?Patient identified by MRN, date of birth, ID band ?Patient awake ? ? ? ?Reviewed: ?Allergy & Precautions, NPO status , Patient's Chart, lab work & pertinent test results ? ?History of Anesthesia Complications ?Negative for: history of anesthetic complications ? ?Airway ?Mallampati: III ? ?TM Distance: >3 FB ?Neck ROM: Full ? ? ? Dental ? ?(+) Dental Advisory Given, Teeth Intact ?  ?Pulmonary ?neg pulmonary ROS,  ?  ?breath sounds clear to auscultation ? ? ? ? ? ? Cardiovascular ?hypertension, Pt. on medications and Pt. on home beta blockers ?(-) angina(-) Past MI and (-) CHF  ?Rhythm:Regular  ?'21 ECHO:EF 55-60%. The LV has normal function, no regional wall motion abnormalities. 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 Grade II diastolic dysfunction, no significant valvular abnormalities ?  ?Neuro/Psych ? Headaches, PSYCHIATRIC DISORDERS Anxiety Depression Trigeminal neuralgia ? Neuromuscular disease   ? GI/Hepatic ?Neg liver ROS, GERD  Medicated,esophageal mass ?  ?Endo/Other  ?negative endocrine ROS ? Renal/GU ?Renal InsufficiencyRenal diseaseLab Results ?     Component                Value               Date                 ?     CREATININE               0.76                07/30/2021           ?  ? ?  ?Musculoskeletal ? ?(+) Arthritis ,  ? Abdominal ?  ?Peds ? Hematology ?negative hematology ROS ?(+) Lab Results ?     Component                Value               Date                 ?     WBC                      4.2                 07/30/2021           ?     HGB                      13.2                07/30/2021           ?     HCT                      40.5                07/30/2021           ?     MCV                      90.8                07/30/2021           ?     PLT                      169  07/30/2021           ?   ?Anesthesia Other Findings ? ? Reproductive/Obstetrics ? ?   ? ? ? ? ? ? ? ? ? ? ? ? ? ?  ?  ? ? ? ? ? ? ? ?Anesthesia Physical ?Anesthesia Plan ? ?ASA: 3 ? ?Anesthesia Plan: MAC  ? ?Post-op Pain Management: Minimal or no pain anticipated  ? ?Induction: Intravenous ? ?PONV Risk Score and Plan: 2 and Treatment may vary due to age or medical condition, Ondansetron and Dexamethasone ? ?Airway Management Planned: Oral ETT ? ?Additional Equipment: None ? ?Intra-op Plan:  ? ?Post-operative Plan: Extubation in OR ? ?Informed Consent: I have reviewed the patients History and Physical, chart, labs and discussed the procedure including the risks, benefits and alternatives for the proposed anesthesia with the patient or authorized representative who has indicated his/her understanding and acceptance.  ? ? ? ?Dental advisory given ? ?Plan Discussed with: CRNA and Anesthesiologist ? ?Anesthesia Plan Comments:   ? ? ? ? ? ?Anesthesia Quick Evaluation ? ?

## 2021-08-05 NOTE — Op Note (Signed)
[facility] ?Patient Name: [name] ?Procedure Date : 08/05/2021 ?MRN: [id number] ?Attending MD: Justice Britain , MD ?Date of Birth: 22-Oct-1940 ?CSN: [id number] ?Age: 81 ?Admit Type: Outpatient ?Procedure:                Upper GI endoscopy ?Indications:              Dysphagia, Stricture of the esophagus, Follow-up of  ?                          esophageal stricture, Personal history of malignant  ?                          esophageal neoplasm ?Providers:                Justice Britain, MD, Jeanella Cara, RN,  ?                          Frazier Richards, Technician ?Referring MD:             [name], MD, [name], [name]. [name] ?Medicines:                General Anesthesia ?Complications:            No immediate complications. ?Estimated Blood Loss:     Estimated blood loss was minimal. ?Procedure:                Pre-Anesthesia Assessment: ?                          - Prior to the procedure, a History and Physical  ?                          was performed, and patient medications and  ?                          allergies were reviewed. The patient's tolerance of  ?                          previous anesthesia was also reviewed. The risks  ?                          and benefits of the procedure and the sedation  ?                          options and risks were discussed with the patient.  ?                          All questions were answered, and informed consent  ?                          was obtained. Prior Anticoagulants: The patient has  ?                          taken no previous anticoagulant or antiplatelet  ?  agents. ASA Grade Assessment: III - A patient with  ?                          severe systemic disease. After reviewing the risks  ?                          and benefits, the patient was deemed in  ?                          satisfactory condition to undergo the procedure. ?                          After obtaining informed consent,  the endoscope was  ?                          passed under direct vision. Throughout the  ?                          procedure, the patient's blood pressure, pulse, and  ?                          oxygen saturations were monitored continuously. The  ?                          [id number]-[id number] ([id number]) Olympus slim endoscope was  ?                          introduced through the mouth, and advanced to the  ?                          second part of duodenum. The upper GI endoscopy was  ?                          accomplished without difficulty. The patient  ?                          tolerated the procedure. ?Scope In: ?Scope Out: ?Findings: ?     One benign-appearing, intrinsic severe (stenosis; an endoscope cannot  ?     pass) stenosis was found 15 to 21 cm from the incisors. This stenosis  ?     measured 4 mm (inner diameter) x 6 cm (in length). The stenosis was not  ?     traversed initially with the neonatal endoscope. However it was  ?     traversed after dilation up to 6 mm. A guidewire was placed under  ?     fluoroscopic guidance and the scope was withdrawn. Serial dilation was  ?     performed with a Savary dilator with no resistance at 5 mm and 6 mm,  ?     mild resistance at 7 mm and moderate resistance at 8 mm and 9 mm. The  ?     dilation site was examined following every 2 cm dilation upsizings by  ?     having endoscope reinsertion and it showed moderate mucosal disruption,  ?     moderate improvement in luminal narrowing  and no perforation. ?     No gross lesions were noted in the distal esophagus. ?     The PEG tube had been sucred at nearly 3 cm. This is too tight. No  ?     buried bumper has occured, but there remains congestion underneath the  ?     balloon. I readjusted the PEG tube to 4 cm. ?     No other gross lesions were noted in the entire examined stomach. ?     No gross lesions were noted in the duodenal bulb, in the first portion  ?     of the duodenum and in the second portion of the  duodenum. ?Impression:               - Benign-appearing esophageal stenosis (traversed  ?                          after dilation). Dilated with mucosal wrents up to  ?                          9 mm. ?                          - No gross lesions in esophagus distally. ?                          - Readjusted gastrostomy tube to decrease  ?                          congestion found underneath the balloon bulb. ?                          - No other gross lesions in the stomach. ?                          - No gross lesions in the duodenal bulb, in the  ?                          first portion of the duodenum and in the second  ?                          portion of the duodenum. ?Recommendation:           - The patient will be observed post-procedure,  ?                          until all discharge criteria are met. ?                          - Discharge patient to home. ?                          - Patient has a contact number available for  ?                          emergencies. The signs and symptoms of potential  ?  delayed complications were discussed with the  ?                          patient. Return to normal activities tomorrow.  ?                          Written discharge instructions were provided to the  ?                          patient. ?                          - NPO today. ?                          - Continue PPI 20 mg twice daily. ?                          - Take Carafate 2-4 times daily (ideally taking it  ?                          4 times daily will help decrease risk of  ?                          recurrence). If unable to afford the Carafate  ?                          liquid then let us know and we can teach you how to  ?                          make it a slurry. ?                          - I cannot pass an injection needle to do steroid  ?                          injections or I would have tried this to decrease  ?                          recurrence riskTry to  intake Carafate twice daily  ?                          by mouth. If unable to afford the Carafate liquid, ?                          - If patient doing OK, can continue attempts at  ?                          clear liquids ?                          - Follow up with Dr Carlean Purl for next EGD dilation  ?  hopefully in the next few weeks. I am not sure she  ?                          will get to a point of complete ability to take PO  ?                          food for her nutrition but she has seen a  ?                          significant improvement in the ability to tolerate  ?                          her secretions and have some clear liquids. ?                          - Monitor for signs/symptoms of bleeding,  ?                          perforation, and infection. If issues please call  ?                          our number to get further assistance as needed. ?                          - The findings and recommendations were discussed  ?                          with the patient. ?                          - The findings and recommendations were discussed  ?                          with the patient's family. ?Procedure Code(s):        --- Professional --- ?                          [phone], Esophagogastroduodenoscopy, flexible,  ?                          transoral; with insertion of guide wire followed by  ?                          passage of dilator(s) through esophagus over guide  ?                          wire ?Diagnosis Code(s):        --- Professional --- ?                          K22.2, Esophageal obstruction ?                          N79.72, Gastrostomy malfunction ?  R13.10, Dysphagia, unspecified ?                          Z85.01, Personal history of malignant neoplasm of  ?                          esophagus ?CPT copyright 2019 [employer]. All rights reserved. ?The codes documented in this report are preliminary and upon coder  review may  ?be revised to meet current compliance requirements. ?Justice Britain, MD ?08/05/2021 1:19:45 PM ?Number of Addenda: 0 ?

## 2021-08-06 ENCOUNTER — Encounter (HOSPITAL_COMMUNITY): Payer: Self-pay | Admitting: Gastroenterology

## 2021-08-11 NOTE — Anesthesia Postprocedure Evaluation (Signed)
Anesthesia Post Note ? ?Patient: [name] ? ?Procedure(s) Performed: ESOPHAGOGASTRODUODENOSCOPY (EGD) WITH PROPOFOL - fluoro ?SAVORY DILATION ? ?  ? ?Patient location during evaluation: PACU ?Anesthesia Type: General ?Level of consciousness: awake and alert ?Pain management: pain level controlled ?Vital Signs Assessment: post-procedure vital signs reviewed and stable ?Respiratory status: spontaneous breathing, nonlabored ventilation, respiratory function stable and patient connected to nasal cannula oxygen ?Cardiovascular status: blood pressure returned to baseline and stable ?Postop Assessment: no apparent nausea or vomiting ?Anesthetic complications: no ? ? ?No notable events documented. ? ?Last Vitals:  ?Vitals:  ? 08/05/21 1400 08/05/21 1415  ?BP: (!) 182/94 (!) 184/85  ?Pulse: (!) 57 (!) 59  ?Resp: 14 14  ?Temp: 36.5 ?C   ?SpO2: 97% 96%  ?  ?Last Pain:  ?Vitals:  ? 08/05/21 1330  ?PainSc: 8   ? ? ?  ?  ?  ?  ?  ?  ? ?Keniel Ralston ? ? ? ? ?

## 2021-08-12 ENCOUNTER — Encounter: Payer: Self-pay | Admitting: Adult Health

## 2021-08-12 ENCOUNTER — Encounter: Payer: Self-pay | Admitting: Neurology

## 2021-08-12 ENCOUNTER — Ambulatory Visit (INDEPENDENT_AMBULATORY_CARE_PROVIDER_SITE_OTHER): Payer: Medicare Other | Admitting: Adult Health

## 2021-08-12 VITALS — BP 145/78 | HR 59 | Ht 66.0 in | Wt 131.6 lb

## 2021-08-12 DIAGNOSIS — G500 Trigeminal neuralgia: Secondary | ICD-10-CM

## 2021-08-12 DIAGNOSIS — Z79899 Other long term (current) drug therapy: Secondary | ICD-10-CM | POA: Diagnosis not present

## 2021-08-12 MED ORDER — OXCARBAZEPINE 150 MG PO TABS: 150.0000 mg | ORAL_TABLET | Freq: Two times a day (BID) | ORAL | 3 refills | Status: DC

## 2021-08-12 NOTE — Progress Notes (Signed)
? ? ?PATIENT: Ysenia Filice ?DOB: 10/23/1940 ? ?REASON FOR VISIT: follow up ?HISTORY FROM: patient ?PRIMARY NEUROLOGIST:  ? ?HISTORY OF PRESENT ILLNESS: ?Today 08/12/21 ?Ms. Laureitzen is an 81 year old female with a history of Trigeminal Neuralgia. She returns today for follow-up. Reports that she is having breakthrough pain in the right jaw.  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  She states that she starts coughing for a long period of time the pain occurs. Unable to tolerate high dose of oxycarbamazepine.  She states that she does notices that she has been on these medications her balance is slightly off and she feels a little foggy headed. ? ?Currently taking Lamictal 250 mg BID and Oxycarbamazine 150 mg BID ? ?11/2/22Ms Laureitzen is a 81 y.o. female with a history of trigeminal neuralgia here today for a follow up. Patient had Gamma Knife Procedure in August, she had some initial relief then she started having breakthrough pain. Having breakthrough TN episodes nearly daily, they are worse in the evenings. She states that she rarely gets it during sleep, and last night she had woken from her sleep with terrible pain. ? ?Patient endorses that the oxcarbamazepine starting causing side effects such as mobility (initially using cane, now has to use walker), vision changes (diminished vision, double vision (line and dots) and cognitive issues. She stated when she had decreased her dose to 150 mg BID, she noted an increased improvement of all above and her ambulation has improved slightly as well. Her husband then said that due to breakthru pain he increased her lamotrigine to 213m po bid, they have done this for three days.  ? ?Patient would like to see Dr. SVertell Limber unfortunately he is out of the office for the next 12 weeks.Encouraged patient to reach out to his office to determine if there is another provider that specializes in those treatments in his office.  ? ?HISTORY   ?07/08/2020:   ?[facility] also to increase lamotrigine to 2029mpo bid. She has breakthrough pain. It gets worse around 8pm. Just before that 8pm deadline it gets bad and when she eats it breaks through. She has an appointment with Dr. laArlan Organn 6/8. She has to bring all imaging. She had vascular decompression in 2013, lasted 9 years, may need revision?  ?  ?Patient is here on concern of memory changes. She says in the last month she has gotten much better as far as the sciatica goes. She can walk around te house. She uses a caProgrammer, multimediaround the house. She did therapy last spring, and so she is trying to strengthen the muscles in her legs. 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 She is very lucid right now but in the morning she gets unstable and is confused. She can almost fall after she takes her medicine. She feels fantastic now on the oxcarb and lamictal.  ?  ?MRI brain: ?No evidence of recent infarction, hemorrhage, or mass. ? Stable postoperative changes of prior trigeminal nerve ?decompression.Stable mild chronic microvascular ischemic changes' ?. Marland KitchenMRI cervical spine:  ?No abnormal cord signal. ? Multilevel degenerative changes as detailed above. No high-grade canal stenosis. Foraminal narrowing is greatest on the left  at C5-C6. ?  ?TSH 0.88, B12 933,  ?  ?Patient complains of symptoms per HPI as well as the following symptoms: imbalance, falls . Pertinent negatives and positives per HPI. All others negative ?  ?05/11/2020: She is taking more Lamictal. She had the procedure with Dr. Vertell Limber, her right face was numb for some time, there ois discomfort now, once in a while she gets the lightning strike, the procedure made a huge difference. She is on 125m  twice a day of the Lamictal.  ?  ?SShakina Choyis a 81y.o. female here as requested by [name], WStandley Brooking MD for Trigeminal neuralgia. 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 EMart Piggsin the past, remote kidney infection, hypertension, hyperlipidemia, hard of hearing, fatty liver, anxiety and depression.  I reviewed Dr. SMelven Sartoriusnotes, she was seen there for evaluation of bilateral leg pain, increased leg pain left greater than right 6 months, weakness in both legs, she had epidural steroid injections x3, most recently left L4-L5 in November with no relief, numerous medications trialed most recently Cymbalta, Tylenol, Motrin.  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 Reviewed Dr. PVelora Mediatenotes, patient sodium was affected on Tegretol.  ?  ?[facility], she was told hopefully the surgery would help but since then she had been increasing the medication and increased to 1205ma day and was decreased due to blood work and side effects, and now she has stopped the carbamazepine. She was getting side effects from the carbamazepine but it was helping. Dr.  StVertell Limberlaced her on Oxcarbazepine(trileptal) and now she is on cymbalta, she started the Trileptal a few weeks ago. She was started on 30074mnd just a few days ago she was increased to 600m84mice a day(1200mg26may). She is not taking the cymbalta, was likely prescribed for low back pain (Dr. NewtoErnestina Patchese pain starts on the right and shoots down her jaw, chewing, talking makes it worse, severe, shooting, same pain she has always experienced. The pain is severe today. She says she is unstable and tired, gets better after 45 minutes. When she did the clock test she could not draw the clock. No coordination. All the numbers were in sequence. She could not draw.  ?  ? ? ?REVIEW OF SYSTEMS: Out of a complete 14 system review of symptoms, the patient complains only of the following symptoms, and all other reviewed systems are negative. ? ?ALLERGIES: ?Allergies  ?Allergen Reactions  ? Hydrocodone Other (See Comments)  ?  Rebound headaches  ? Sulfamethoxazole-Trimethoprim Hives and Itching  ?  REACTION: unspecified  ? ? ?HOME MEDICATIONS: ?Outpatient Medications Prior to Visit  ?Medication Sig Dispense Refill  ? benazepril (LOTENSIN) 20 MG tablet Take 20 mg by mouth daily.    ? butorphanol (STADOL) 10 MG/ML nasal  spray Place 1 spray into the nose every 4 (four) hours as needed for headache (trigeminal neuralgia.). 2.5 mL 0  ? carvedilol (COREG) 25 MG tablet TAKE 1 TABLET(25 MG) BY MOUTH TWICE DAILY WITH A MEAL 60 tablet 3  ? conjugated estrogens (PREMARIN) vaginal cream Place 1 applicator vaginally 2 (two) times a week.    ? fluticasone (FLONASE) 50 MCG/ACT nasal spray Place 2 sprays into both nostrils as needed for allergies or rhinitis. 16 g 5  ? furosemide (LASIX) 20 MG tablet TAKE 1 TABLET(20 MG) BY MOUTH DAILY 90 tablet 0  ? guaiFENesin-codeine 100-10 MG/5ML syrup TAKE 5 ML BY MOUTH EVERY 6 HOURS AS NEEDED FOR COUGH 240 mL 0  ? lamoTRIgine (LAMICTAL) 100 MG tablet Take 100 mg by mouth See admin instructions. Take  with 3 x 25 mg (75 mg) for a total of 175 mg twice a day    ? lamoTRIgine (LAMICTAL) 25 MG tablet Take 75 mg by mouth See admin instructions. Take with 100 mg for a total of 175 mg twice a day    ? loperamide (

## 2021-08-13 NOTE — Telephone Encounter (Signed)
Called pt at home number. Number not accepting calls. Called her cell phone and had to LVM ok per DPR. Informed pt that medication adjustments can not be made until lab results come in. As of now results have not been sent to the office so there for if she is in a lot of pain she is advised to go to an urgent care facility or the nearest ER.  ?

## 2021-08-13 NOTE — Telephone Encounter (Signed)
Spoke with patient, advised her to increase Oxcarbazepine to 147m TID. MJinny Blossomwill follow up with her Monday.

## 2021-08-13 NOTE — Telephone Encounter (Signed)
Pt called stating that she is in a lot of pain and would like to know if the on call provider can call something in for her. She states provider she saw yesterday offered to call her in something if she needed it for the weekend. Please advise. ?

## 2021-08-14 LAB — LAMOTRIGINE LEVEL: Lamotrigine Lvl: 13.4 ug/mL (ref 2.0–20.0)

## 2021-08-14 LAB — 10-HYDROXYCARBAZEPINE: Oxcarbazepine SerPl-Mcnc: 8 ug/mL — ABNORMAL LOW (ref 10–35)

## 2021-08-15 ENCOUNTER — Other Ambulatory Visit: Payer: Self-pay | Admitting: Internal Medicine

## 2021-08-16 ENCOUNTER — Telehealth: Payer: Self-pay | Admitting: Internal Medicine

## 2021-08-16 ENCOUNTER — Other Ambulatory Visit: Payer: Self-pay

## 2021-08-16 DIAGNOSIS — K222 Esophageal obstruction: Secondary | ICD-10-CM

## 2021-08-16 DIAGNOSIS — R1310 Dysphagia, unspecified: Secondary | ICD-10-CM

## 2021-08-16 NOTE — Telephone Encounter (Signed)
Please call patient and see how she is tolerating the dose increase ?

## 2021-08-16 NOTE — Telephone Encounter (Signed)
EGD scheduled on 08/23/2021 at 12:15 with Dr. Carlean Purl at [facility]: ?Ambulatory referral to GI placed: ?Left message for pt to call back  ?

## 2021-08-16 NOTE — Telephone Encounter (Signed)
Spoke with patient this am.Pt states she is doing fine.Pt states what is the plan going forward with her Neuralgia  Will forward message to [facility]  ?

## 2021-08-16 NOTE — Telephone Encounter (Signed)
If this is working well and no side effects then she will continue this. ?

## 2021-08-16 NOTE — Telephone Encounter (Signed)
I have gotten okay to overbook her on April 3 at 1130 or 1145 from Tory Emerald, RN at [facility] ? ?Procedure would be upper GI endoscopy using fluoroscopy and esophageal dilation ? ?I left the patient a message that we would be calling back to see if this would work for her and if it does you can call [facility] and or centralized scheduling to arrange this. ? ? ?Please let me know the outcome ?

## 2021-08-16 NOTE — Telephone Encounter (Signed)
The patient called back and spoke to me in April 3 will work for her so please proceed with setting that up and confirming with the patient. ?

## 2021-08-17 NOTE — Telephone Encounter (Signed)
EGD scheduled on 08/23/2021 at 11:45 with Dr. Carlean Purl at [facility]: Case # [id number] ?Ambulatory referral to GI placed: ?Instructions created and sent to pt via My Chart Pt made aware ?Pt verbalized understanding with all questions answered.  ? ?

## 2021-08-19 ENCOUNTER — Other Ambulatory Visit: Payer: Self-pay | Admitting: Gastroenterology

## 2021-08-20 DIAGNOSIS — I10 Essential (primary) hypertension: Secondary | ICD-10-CM

## 2021-08-23 ENCOUNTER — Ambulatory Visit (HOSPITAL_COMMUNITY)
Admission: RE | Admit: 2021-08-23 | Discharge: 2021-08-23 | Disposition: A | Discharge status: Home / Self Care | Payer: Medicare Other | Attending: Internal Medicine | Admitting: Internal Medicine

## 2021-08-23 ENCOUNTER — Encounter (HOSPITAL_COMMUNITY)
Admission: RE | Disposition: A | Discharge status: Home / Self Care | Payer: Self-pay | Source: Home / Self Care | Attending: Internal Medicine

## 2021-08-23 ENCOUNTER — Other Ambulatory Visit: Payer: Self-pay

## 2021-08-23 ENCOUNTER — Ambulatory Visit (HOSPITAL_COMMUNITY): Payer: Medicare Other

## 2021-08-23 ENCOUNTER — Ambulatory Visit (HOSPITAL_COMMUNITY): Payer: Medicare Other | Admitting: Anesthesiology

## 2021-08-23 ENCOUNTER — Encounter (HOSPITAL_COMMUNITY): Payer: Self-pay | Admitting: Internal Medicine

## 2021-08-23 ENCOUNTER — Ambulatory Visit (HOSPITAL_BASED_OUTPATIENT_CLINIC_OR_DEPARTMENT_OTHER): Payer: Medicare Other | Admitting: Anesthesiology

## 2021-08-23 DIAGNOSIS — K760 Fatty (change of) liver, not elsewhere classified: Secondary | ICD-10-CM | POA: Insufficient documentation

## 2021-08-23 DIAGNOSIS — K222 Esophageal obstruction: Secondary | ICD-10-CM

## 2021-08-23 DIAGNOSIS — Z8501 Personal history of malignant neoplasm of esophagus: Secondary | ICD-10-CM | POA: Diagnosis not present

## 2021-08-23 DIAGNOSIS — I10 Essential (primary) hypertension: Secondary | ICD-10-CM | POA: Insufficient documentation

## 2021-08-23 DIAGNOSIS — Z79890 Hormone replacement therapy (postmenopausal): Secondary | ICD-10-CM | POA: Insufficient documentation

## 2021-08-23 DIAGNOSIS — R1310 Dysphagia, unspecified: Secondary | ICD-10-CM | POA: Insufficient documentation

## 2021-08-23 DIAGNOSIS — K219 Gastro-esophageal reflux disease without esophagitis: Secondary | ICD-10-CM | POA: Insufficient documentation

## 2021-08-23 DIAGNOSIS — Z79899 Other long term (current) drug therapy: Secondary | ICD-10-CM | POA: Insufficient documentation

## 2021-08-23 DIAGNOSIS — Z931 Gastrostomy status: Secondary | ICD-10-CM | POA: Insufficient documentation

## 2021-08-23 DIAGNOSIS — E785 Hyperlipidemia, unspecified: Secondary | ICD-10-CM | POA: Diagnosis not present

## 2021-08-23 HISTORY — PX: ESOPHAGOGASTRODUODENOSCOPY (EGD) WITH PROPOFOL: SHX5813

## 2021-08-23 HISTORY — PX: SAVORY DILATION: SHX5439

## 2021-08-23 SURGERY — ESOPHAGOGASTRODUODENOSCOPY (EGD) WITH PROPOFOL
Anesthesia: General

## 2021-08-23 MED ORDER — SODIUM CHLORIDE 0.9 % IV SOLN: INTRAVENOUS | Status: DC

## 2021-08-23 MED ORDER — SUCRALFATE 1 GM/10ML PO SUSP: 1.0000 g | Freq: Three times a day (TID) | ORAL | 5 refills | Status: DC

## 2021-08-23 MED ORDER — ONDANSETRON HCL 4 MG/2ML IJ SOLN
INTRAMUSCULAR | Status: DC | PRN
Start: 2021-08-23 — End: 2021-08-23
  Administered 2021-08-23: 4 mg via INTRAVENOUS

## 2021-08-23 MED ORDER — LIDOCAINE 2% (20 MG/ML) 5 ML SYRINGE
INTRAMUSCULAR | Status: DC | PRN
Start: 2021-08-23 — End: 2021-08-23
  Administered 2021-08-23: 50 mg via INTRAVENOUS

## 2021-08-23 MED ORDER — SUCCINYLCHOLINE CHLORIDE 200 MG/10ML IV SOSY
PREFILLED_SYRINGE | INTRAVENOUS | Status: DC | PRN
Start: 2021-08-23 — End: 2021-08-23
  Administered 2021-08-23: 100 mg via INTRAVENOUS

## 2021-08-23 MED ORDER — DEXAMETHASONE SODIUM PHOSPHATE 10 MG/ML IJ SOLN
INTRAMUSCULAR | Status: DC | PRN
  Administered 2021-08-23: 8 mg via INTRAVENOUS

## 2021-08-23 MED ORDER — LACTATED RINGERS IV SOLN: INTRAVENOUS | Status: DC

## 2021-08-23 MED ORDER — PROPOFOL 500 MG/50ML IV EMUL
INTRAVENOUS | Status: DC | PRN
  Administered 2021-08-23: 150 ug/kg/min via INTRAVENOUS

## 2021-08-23 MED ORDER — PROPOFOL 10 MG/ML IV BOLUS
INTRAVENOUS | Status: DC | PRN
  Administered 2021-08-23 (×2): 20 mg via INTRAVENOUS
  Administered 2021-08-23: 30 mg via INTRAVENOUS
  Administered 2021-08-23: 20 mg via INTRAVENOUS
  Administered 2021-08-23: 100 mg via INTRAVENOUS

## 2021-08-23 SURGICAL SUPPLY — 15 items

## 2021-08-23 NOTE — H&P (Signed)
?[facility] History and Physical ? ? ?Primary Care Physician:  [name], MD ? ? ?Reason for Procedure:   Esophageal stricture and dysphagia ? ?Plan:    Upper endoscopy + esophageal dilation ? ? ? ? ?HPI: [name] is a 81 y.o. 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 Last dilation to 9 mm Savary 08/05/2021. She is tolerating yoogurt, applesauce. When she began having dilations she could not clear Salive ? ? ?Past Medical History:  ?Diagnosis Date  ? Abdominal pain 05/29/2013  ? s/p rx of cephalo resistant e coli   but last rx NG  now residular ?  bladder sx repeat cx sx rx to ty and uro consult   ? 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 Qualifier: Diagnosis of  By: [name], [name]   ? Agent resistant to multiple antibiotics 05/29/2013  ? e coli   bu NG on fu.    ? Anemia   ? Anxiety   ? ARF (acute renal failure) ([place]) 03/12/2015  ? Closed head injury 02/01/2011  ? from syncope and had scalp laceration  neg ct .    ? Closed head injury 5-6 yrs ago  ? Colitis 11/27/2017  ? Complication of anesthesia   ? migraine several hours after general anesthesia  ? Depression   ? esophageal ca 01/2021  ? Fatty liver   ? Gall stones 2016  ? see ct scan neg HIDA   ? GERD (gastroesophageal reflux disease)   ? Hearing aid worn   ? 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 Hyperlipidemia   ? Hypertension   ? echo nl lv function  mild dilitation 2009  ? Kidney infection   ? few yrs ago in hospital  ? Medication side effect 09/02/2010  ? Poss muscle se of 10 crestor   ? Migraine   ? hypnic HA eval by Dr. Earley Favor in the past  ? Polycythemia   ? 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 Pyelonephritis 03/12/2015  ? Sensation of pain in anesthetized distribution of trigeminal nerve   ? Syncope 02/01/2011  ? In shower on vacation  sustained head laceration  8 sutures Had ed visit neg head ct labs and x ray   ? Trigeminal  neuralgia pain   ? ? ?Past Surgical History:  ?Procedure Laterality Date  ? ABDOMINAL HYSTERECTOMY  2002  ? tubal  ? BACK SURGERY    ? 2 times, for sciatic nerve pain  ? BALLOON DILATION  06/21/2021  ? Procedure: DILATION BALLOON used for esophageal stricture;  Surgeon: Rush Landmark Telford Nab., MD;  Location: [facility];  Service: Gastroenterology;;  ? CARDIAC CATHETERIZATION  2000  ? chest pains neg  ? CHOLECYSTECTOMY N/A 02/21/2017  ? Procedure: LAPAROSCOPIC CHOLECYSTECTOMY WITH INTRAOPERATIVE CHOLANGIOGRAM;  Surgeon: Armandina Gemma, MD;  Location: [facility];  Service: General;  Laterality: N/A;  ? COLONOSCOPY    ? multiple  ? CRANIOTOMY  12/09/2011  ? nerve decompression right trigeminal   ? DIRECT LARYNGOSCOPY N/A 02/12/2021  ? Procedure: DIRECT LARYNGOSCOPY AND BIOPSY POSSIBLE FROZEN;  Surgeon: [name], MD;  Location: [place];  Service: ENT;  Laterality: N/A;  ? DOPPLER ECHOCARDIOGRAPHY  2009  ? nl lv function mild lv dilitation  ? ESOPHAGOGASTRODUODENOSCOPY (EGD) WITH PROPOFOL N/A 06/21/2021  ? Procedure: ESOPHAGOGASTRODUODENOSCOPY (EGD) WITH PROPOFOL;  Surgeon: Rush Landmark Telford Nab., MD;  Location: [facility];  Service: Gastroenterology;  Laterality: N/A;  Request Fluoroscopy; Plan in for dilation  ? ESOPHAGOGASTRODUODENOSCOPY (EGD) WITH PROPOFOL N/A 07/19/2021  ? Procedure: ESOPHAGOGASTRODUODENOSCOPY (EGD) WITH PROPOFOL;  Surgeon: Rush Landmark Telford Nab., MD;  Location: [facility];  Service: Endoscopy;  Laterality: N/A;  Dilation fluoro  ? ESOPHAGOGASTRODUODENOSCOPY (EGD) WITH PROPOFOL N/A 08/05/2021  ? Procedure: ESOPHAGOGASTRODUODENOSCOPY (EGD) WITH PROPOFOL - fluoro;  Surgeon: Mansouraty, Telford Nab., MD;  Location: [facility];  Service: Gastroenterology;  Laterality: N/A;  ? EYE SURGERY Bilateral   ? ioc for catatracts  ? GASTROSTOMY N/A 02/26/2021  ? Procedure: OPEN GASTROSTOMY TUBE PLACEMENT;  Surgeon: Armandina Gemma, MD;  Location: [facility];  Service: General;  Laterality:  N/A;  ? IR GASTROSTOMY TUBE MOD SED  02/22/2021  ? laparoscopic gallbladder surgery  02/16/2017  ? Fax from [facility]  ? OOPHORECTOMY Bilateral 2002  ? rt shoulder surgery    ? SAVORY DILATION N/A 06/21/2021  ? Procedure: SAVORY DILATION;  Surgeon: Irving Copas., MD;  Location: [facility];  Service: Gastroenterology;  Laterality: N/A;  ? SAVORY DILATION N/A 07/19/2021  ? Procedure: SAVORY DILATION;  Surgeon: Irving Copas., MD;  Location: [facility];  Service: Endoscopy;  Laterality: N/A;  ? SAVORY DILATION N/A 08/05/2021  ? Procedure: SAVORY DILATION;  Surgeon: Irving Copas., MD;  Location: [place];  Service: Gastroenterology;  Laterality: N/A;  ? ? ?Prior to Admission medications   ?Medication Sig Start Date End Date Taking? Authorizing Provider  ?benazepril (LOTENSIN) 20 MG tablet Take 20 mg by mouth daily. 10/23/20  Yes [provider]  ?carvedilol (COREG) 25 MG tablet TAKE 1 TABLET(25 MG) BY MOUTH TWICE DAILY WITH A MEAL 07/19/21  Yes [name], [name], MD  ?conjugated estrogens (PREMARIN) vaginal cream Place 1 applicator vaginally 2 (two) times a week.   Yes [provider]  ?furosemide (LASIX) 20 MG tablet TAKE 1 TABLET(20 MG) BY MOUTH DAILY 08/16/21  Yes [name], [name], MD  ?lamoTRIgine (LAMICTAL) 200 MG tablet Take 200 mg by mouth See admin instructions. 250 mg twice daily 04/04/21  Yes [provider]  ?lamoTRIgine (LAMICTAL) 25 MG tablet Take 50 mg by mouth See admin instructions. 250 mg twice daily   Yes [provider]  ?Multiple Minerals-Vitamins (CAL-MAG-ZINC-D PO) Take 2 tablets by mouth daily.   Yes [provider]  ?Multiple Vitamin (MULTIVITAMIN ADULT PO) Take 1 tablet by mouth daily. One a day   Yes [provider]  ?Nutritional Supplements (FEEDING SUPPLEMENT, OSMOLITE 1.5 CAL,) LIQD Place 237 mLs into feeding tube 5 (five) times daily.   Yes [provider]  ?omeprazole (PRILOSEC) 20 MG  capsule Take 20 mg by mouth daily.   Yes [provider]  ?ondansetron (ZOFRAN-ODT) 4 MG disintegrating tablet Take 1-2 tablets (4-8 mg total) by mouth every 8 (eight) hours as needed for nausea or vomiting. 07/16/21  Yes [name], MD  ?Oxcarbazepine (TRILEPTAL) 150 MG tablet Take 1 tablet (150 mg total) by mouth 2 (two) times daily. Take 300 mg in the morning and 450 mg in the evening ?Patient taking differently: Take 150 mg by mouth in the morning, at noon, and at bedtime. 08/12/21  Yes [name], NP  ?Polyethyl Glycol-Propyl Glycol (SYSTANE) 0.4-0.3 % GEL ophthalmic gel Place 1 application into both eyes 2 (two) times daily as needed (dry eyes).   Yes [provider]  ?sucralfate (CARAFATE) 1 GM/10ML suspension Take 10 mLs (1 g total) by mouth 4 (four) times daily.  08/05/21 08/05/22 Yes Mansouraty, Telford Nab., MD  ?butorphanol (STADOL) 10 MG/ML nasal spray Place 1 spray into the nose every 4 (four) hours as needed for headache (trigeminal neuralgia.). 04/02/21   Dohmeier, Asencion Partridge, MD  ?fluticasone (FLONASE) 50 MCG/ACT nasal spray Place 2 sprays into both nostrils as needed for allergies or rhinitis. 02/11/20   [name], [name], MD  ?guaiFENesin-codeine 100-10 MG/5ML syrup TAKE 5 ML BY MOUTH EVERY 6 HOURS AS NEEDED FOR COUGH 06/29/21   [name], MD  ?loperamide (IMODIUM) 2 MG capsule Take 2 mg by mouth as needed for diarrhea or loose stools.    [provider]  ?prochlorperazine (COMPAZINE) 10 MG tablet TAKE 1 TABLET(10 MG) BY MOUTH EVERY 6 HOURS AS NEEDED FOR NAUSEA OR VOMITING 06/17/21   [name], MD  ? ? ?Current Facility-Administered Medications  ?Medication Dose Route Frequency Provider Last Rate Last Admin  ? 0.9 %  sodium chloride infusion   Intravenous Continuous [name], MD      ? lactated ringers infusion   Intravenous Continuous [name], MD 10 mL/hr at 08/23/21 1125 New Bag at 08/23/21 1125  ? ? ?Allergies as of 08/16/2021 - Review Complete 08/12/2021   ?Allergen Reaction Noted  ? Hydrocodone Other (See Comments) 11/24/2015  ? Sulfamethoxazole-trimethoprim Hives and Itching   ? ? ?Family History  ?Problem Relation Age of Onset  ? Cancer Mother   ?     uterine  ? Ovari

## 2021-08-23 NOTE — Anesthesia Procedure Notes (Signed)
Procedure Name: Intubation ?Date/Time: 08/23/2021 12:15 PM ?Performed by: Milford Cage, CRNA ?Pre-anesthesia Checklist: Patient identified, Emergency Drugs available, Suction available and Patient being monitored ?Patient Re-evaluated:Patient Re-evaluated prior to induction ?Oxygen Delivery Method: Circle system utilized ?Preoxygenation: Pre-oxygenation with 100% oxygen ?Induction Type: IV induction ?Ventilation: Mask ventilation without difficulty ?Laryngoscope Size: Sabra Heck and 2 ?Grade View: Grade II ?Tube type: Oral ?Tube size: 7.0 mm ?Number of attempts: 1 ?Airway Equipment and Method: Stylet ?Placement Confirmation: ETT inserted through vocal cords under direct vision, positive ETCO2 and breath sounds checked- equal and bilateral ?Secured at: 21 cm ?Tube secured with: Tape ?Dental Injury: Teeth and Oropharynx as per pre-operative assessment  ? ? ? ? ?

## 2021-08-23 NOTE — Transfer of Care (Signed)
Immediate Anesthesia Transfer of Care Note ? ?Patient: [name] ? ?Procedure(s) Performed: ESOPHAGOGASTRODUODENOSCOPY (EGD) WITH PROPOFOL ?SAVORY DILATION ? ?Patient Location: Endoscopy Unit ? ?Anesthesia Type:General ? ?Level of Consciousness: drowsy ? ?Airway & Oxygen Therapy: Patient Spontanous Breathing ? ?Post-op Assessment: Report given to RN and Post -op Vital signs reviewed and stable ? ?Post vital signs: Reviewed and stable ? ?Last Vitals:  ?Vitals Value Taken Time  ?BP    ?Temp    ?Pulse    ?Resp    ?SpO2    ? ? ?Last Pain: There were no vitals filed for this visit.   ? ?  ? ?Complications: No notable events documented. ?

## 2021-08-23 NOTE — Anesthesia Postprocedure Evaluation (Signed)
Anesthesia Post Note ? ?Patient: [name] ? ?Procedure(s) Performed: ESOPHAGOGASTRODUODENOSCOPY (EGD) WITH PROPOFOL ?SAVORY DILATION ? ?  ? ?Patient location during evaluation: Endoscopy ?Anesthesia Type: General ?Level of consciousness: awake and alert ?Pain management: pain level controlled ?Vital Signs Assessment: post-procedure vital signs reviewed and stable ?Respiratory status: spontaneous breathing, nonlabored ventilation, respiratory function stable and patient connected to nasal cannula oxygen ?Cardiovascular status: blood pressure returned to baseline and stable ?Postop Assessment: no apparent nausea or vomiting ?Anesthetic complications: no ? ? ?No notable events documented. ? ?Last Vitals:  ?Vitals:  ? 08/23/21 1325 08/23/21 1330  ?BP:  (!) 199/97  ?Pulse: (!) 57 (!) 59  ?Resp: 15 18  ?Temp:    ?SpO2: 96% 96%  ?  ?Last Pain:  ?Vitals:  ? 08/23/21 1257  ?TempSrc: Temporal  ?PainSc: 0-No pain  ? ? ?  ?  ?  ?  ?  ?  ? ?March Rummage Daielle Melcher ? ? ? ? ?

## 2021-08-23 NOTE — Anesthesia Preprocedure Evaluation (Addendum)
Anesthesia Evaluation  ?Patient identified by MRN, date of birth, ID band ?Patient awake ? ? ? ?Reviewed: ?Allergy & Precautions, NPO status , Patient's Chart, lab work & pertinent test results ? ?Airway ?Mallampati: II ? ?TM Distance: >3 FB ?Neck ROM: Full ? ? ? Dental ?no notable dental hx. ? ?  ?Pulmonary ?neg pulmonary ROS,  ?  ?Pulmonary exam normal ? ? ? ? ? ? ? Cardiovascular ?hypertension, Pt. on medications and Pt. on home beta blockers ? ?Rhythm:Regular Rate:Normal ? ? ?  ?Neuro/Psych ? Headaches, Anxiety Depression   ? GI/Hepatic ?Neg liver ROS, GERD  Medicated,dysphagia ?  ?Endo/Other  ?negative endocrine ROS ? Renal/GU ?  ?negative genitourinary ?  ?Musculoskeletal ? ?(+) Arthritis , Osteoarthritis,   ? Abdominal ?Normal abdominal exam  (+)   ?Peds ? Hematology ? ?(+) Blood dyscrasia, anemia ,   ?Anesthesia Other Findings ? ? Reproductive/Obstetrics ? ?  ? ? ? ? ? ? ? ? ? ? ? ? ? ?  ?  ? ? ? ? ? ? ? ? ?Anesthesia Physical ?Anesthesia Plan ? ?ASA: 2 ? ?Anesthesia Plan: General  ? ?Post-op Pain Management:   ? ?Induction: Intravenous ? ?PONV Risk Score and Plan: Treatment may vary due to age or medical condition, Ondansetron and Dexamethasone ? ?Airway Management Planned: Mask and Oral ETT ? ?Additional Equipment: None ? ?Intra-op Plan:  ? ?Post-operative Plan: Extubation in OR ? ?Informed Consent: I have reviewed the patients History and Physical, chart, labs and discussed the procedure including the risks, benefits and alternatives for the proposed anesthesia with the patient or authorized representative who has indicated his/her understanding and acceptance.  ? ? ? ?Dental advisory given ? ?Plan Discussed with: CRNA ? ?Anesthesia Plan Comments: (- given stricture location, GA with ETT request by GI team. ? ?Lab Results ?     Component                Value               Date                 ?     WBC                      4.2                 07/30/2021           ?      HGB                      13.2                07/30/2021           ?     HCT                      40.5                07/30/2021           ?     MCV                      90.8                07/30/2021           ?     PLT  169                 07/30/2021           ?Lab Results ?     Component                Value               Date                 ?     NA                       137                 07/30/2021           ?     K                        4.4                 07/30/2021           ?     CO2                      33 (H)              07/30/2021           ?     GLUCOSE                  90                  07/30/2021           ?     BUN                      21                  07/30/2021           ?     CREATININE               0.76                07/30/2021           ?     CALCIUM                  9.7                 07/30/2021           ?     EGFR                     80                  03/24/2021           ?     GFRNONAA                 >60                 07/30/2021          )  ? ? ? ? ? ?Anesthesia Quick Evaluation ? ?

## 2021-08-23 NOTE — Op Note (Signed)
[facility] ?Patient Name: [name] ?Procedure Date: 08/23/2021 ?MRN: [id number] ?Attending MD: [name] , MD ?Date of Birth: 04-04-1941 ?CSN: [id number] ?Age: 81 ?Admit Type: Outpatient ?Procedure:                Upper GI endoscopy ?Indications:              Dysphagia, Stenosis of the esophagus, Follow-up of  ?                          esophageal stenosis, For therapy of esophageal  ?                          stenosis ?Providers:                [name], MD, Kary Kos RN, RN, Charlean Merl  ?                          Purcell Nails, Merchant [employer] officer, [employer], CRNA ?Referring MD:              ?Medicines:                General Anesthesia ?Complications:            No immediate complications. ?Estimated Blood Loss:     Estimated blood loss was minimal. ?Procedure:                Pre-Anesthesia Assessment: ?                          - Prior to the procedure, a History and Physical  ?                          was performed, and patient medications and  ?                          allergies were reviewed. The patient's tolerance of  ?                          previous anesthesia was also reviewed. The risks  ?                          and benefits of the procedure and the sedation  ?                          options and risks were discussed with the patient.  ?                          All questions were answered, and informed consent  ?                          was obtained. Prior Anticoagulants: The patient has  ?                          taken no previous anticoagulant or antiplatelet  ?  agents. ASA Grade Assessment: III - A patient with  ?                          severe systemic disease. After reviewing the risks  ?                          and benefits, the patient was deemed in  ?                          satisfactory condition to undergo the procedure. ?                          After obtaining informed consent, the endoscope was  ?                          passed  under direct vision. Throughout the  ?                          procedure, the patient's blood pressure, pulse, and  ?                          oxygen saturations were monitored continuously. The  ?                          [id number]-[id number] ([id number]) Olympus slim endoscope was  ?                          introduced through the mouth, and advanced to the  ?                          antrum of the stomach. The upper GI endoscopy was  ?                          accomplished without difficulty. The patient  ?                          tolerated the procedure well. ?Scope In: ?Scope Out: ?Findings: ?     One benign-appearing, intrinsic severe stenosis was found 15 to 21 cm  ?     from the incisors. This stenosis measured 6 mm (inner diameter) x 6 cm  ?     (in length). The stenosis was traversed. A guidewire was placed under  ?     fluoroscopic guidance and the scope was withdrawn. Dilation was  ?     performed with a Savary dilator with mild resistance at 7 mm. A  ?     guidewire was placed under fluoroscopic guidance and the scope was  ?     withdrawn. Dilation was performed with a Savary dilator with mild  ?     resistance at 8 mm. A guidewire was placed under fluoroscopic guidance  ?     and the scope was withdrawn. Dilation was performed with a Savary  ?     dilator with moderate resistance at 9 mm. A guidewire was placed under  ?     fluoroscopic guidance and the scope was withdrawn. Dilation was  ?  performed with a Savary dilator with moderate resistance at 10 mm. A  ?     guidewire was placed under fluoroscopic guidance and the scope was  ?     withdrawn. Dilation was performed with a Savary dilator with moderate  ?     resistance at 11 mm. The dilation site was examined following endoscope  ?     reinsertion and showed moderate mucosal disruption. It was examined  ?     after 8, after 10 and after 11 mm dilations . Estimated blood loss was  ?     minimal. ?     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 This  ?     was characterized by healthy appearing mucosa. ?     The cardia and gastric fundus were normal on retroflexion. ?     The exam was otherwise without abnormality. ?Impression:               - Benign-appearing esophageal stenosis. Dilated. ?                          - Gastrostomy present characterized by healthy  ?                          appearing mucosa. Skin mark at 4 cm ?                          - The examination was otherwise normal. Exam to  ?                          antrum ?                          - No specimens collected. ?Moderate Sedation: ?     Not Applicable - Patient had care per Anesthesia. ?Recommendation:           - Patient has a contact number available for  ?                          emergencies. The signs and symptoms of potential  ?                          delayed complications were discussed with the  ?                          patient. Return to normal activities tomorrow.  ?                          Written discharge instructions were provided to the  ?                          patient. ?                          - Clear liquids x then soft foods tomorrow and try  ?                          mosit and minced foods starting  08/25/21 ?                          - Continue sucralfate ?                          - will contact patient and arrange f/u dilation USE  ?                          GENERAL ANESTHESIA ?                          - she had a skin tear L forearm, closed w/  ?                          steri-strips and bandaged - leave on til the fall  ?                          off, keep covered until healed ?Procedure Code(s):        --- Professional --- ?                          [phone], Esophagoscopy, flexible, transoral; with  ?                          insertion of guide wire followed by passage of  ?                          dilator(s) over guide wire ?Diagnosis Code(s):        --- Professional --- ?                          K22.2, Esophageal obstruction ?                           Z93.1, Gastrostomy status ?                          R13.10, Dysphagia, unspecified ?CPT copyright 2019 American Medical Association. All rights reserved. ?The codes documented in this report are preliminary and upon coder review may  ?be revised to meet current compliance requirements. ?[name], MD ?08/23/2021 1:11:55 PM ?This report has been signed electronically. ?Number of Addenda: 0 ?

## 2021-08-23 NOTE — Discharge Instructions (Addendum)
I dilated the stricture to 11 mm today. ? ?Please stay on liquids only today and then tomorrow try applesauce and yogurt again if doing well. ? ?If those are tolerated then the next day try minced and moist foods (see attached). ? ?I will be in touch regarding the next dilation appointment - we will have to search for one like we have been. ? ?I sent a refill for the sucralfate. ? ?Leave the steri-strips on the left arm until they fall off and keep the skin tear area clean and covered. OK to wash. ? ?I appreciate the opportunity to care for you. ?[name], MD, Marval Regal ?

## 2021-08-23 NOTE — Progress Notes (Signed)
Small skin tear on left arm during procedure.  Left Arm was very bruised and fragile looking on admission. Botts CRNA dressed skin tear which had small amt of bleeding. On admission to recovery room , dressing was clean dry and intact. Dr. Rex Kras made aware. No new orders at this time. Will continue to assess ?

## 2021-08-25 ENCOUNTER — Encounter (HOSPITAL_COMMUNITY): Payer: Self-pay | Admitting: Internal Medicine

## 2021-08-27 ENCOUNTER — Inpatient Hospital Stay (HOSPITAL_BASED_OUTPATIENT_CLINIC_OR_DEPARTMENT_OTHER): Payer: Medicare Other | Admitting: Hematology

## 2021-08-27 ENCOUNTER — Inpatient Hospital Stay: Payer: Medicare Other | Attending: Nurse Practitioner

## 2021-08-27 ENCOUNTER — Encounter: Payer: Self-pay | Admitting: Hematology

## 2021-08-27 ENCOUNTER — Inpatient Hospital Stay: Payer: Medicare Other

## 2021-08-27 ENCOUNTER — Other Ambulatory Visit: Payer: Self-pay

## 2021-08-27 ENCOUNTER — Inpatient Hospital Stay: Payer: Medicare Other | Admitting: Dietician

## 2021-08-27 VITALS — BP 158/68 | HR 62 | Temp 97.6°F | Resp 19 | Wt 133.0 lb

## 2021-08-27 DIAGNOSIS — C153 Malignant neoplasm of upper third of esophagus: Secondary | ICD-10-CM | POA: Diagnosis not present

## 2021-08-27 DIAGNOSIS — Z79899 Other long term (current) drug therapy: Secondary | ICD-10-CM | POA: Diagnosis not present

## 2021-08-27 DIAGNOSIS — Z5112 Encounter for antineoplastic immunotherapy: Secondary | ICD-10-CM | POA: Diagnosis not present

## 2021-08-27 LAB — CMP (CANCER CENTER ONLY)
ALT: 16 U/L (ref 0–44)
AST: 18 U/L (ref 15–41)
Albumin: 3.7 g/dL (ref 3.5–5.0)
Alkaline Phosphatase: 102 U/L (ref 38–126)
Anion gap: 4 — ABNORMAL LOW (ref 5–15)
BUN: 23 mg/dL (ref 8–23)
CO2: 32 mmol/L (ref 22–32)
Calcium: 9.6 mg/dL (ref 8.9–10.3)
Chloride: 98 mmol/L (ref 98–111)
Creatinine: 0.77 mg/dL (ref 0.44–1.00)
GFR, Estimated: >60 mL/min (ref >60)
Glucose, Bld: 137 mg/dL — ABNORMAL HIGH (ref 70–99)
Potassium: 4.4 mmol/L (ref 3.5–5.1)
Sodium: 134 mmol/L — ABNORMAL LOW (ref 135–145)
Total Bilirubin: 0.5 mg/dL (ref 0.3–1.2)
Total Protein: 6.4 g/dL — ABNORMAL LOW (ref 6.5–8.1)

## 2021-08-27 LAB — CBC WITH DIFFERENTIAL (CANCER CENTER ONLY)
Abs Immature Granulocytes: 0.01 10*3/uL (ref 0.00–0.07)
Basophils Absolute: 0 10*3/uL (ref 0.0–0.1)
Basophils Relative: 1 %
Eosinophils Absolute: 0.1 10*3/uL (ref 0.0–0.5)
Eosinophils Relative: 2 %
HCT: 40.4 % (ref 36.0–46.0)
Hemoglobin: 13 g/dL (ref 12.0–15.0)
Immature Granulocytes: 0 %
Lymphocytes Relative: 14 %
Lymphs Abs: 0.6 10*3/uL — ABNORMAL LOW (ref 0.7–4.0)
MCH: 29 pg (ref 26.0–34.0)
MCHC: 32.2 g/dL (ref 30.0–36.0)
MCV: 90 fL (ref 80.0–100.0)
Monocytes Absolute: 0.5 10*3/uL (ref 0.1–1.0)
Monocytes Relative: 10 %
Neutro Abs: 3.3 10*3/uL (ref 1.7–7.7)
Neutrophils Relative %: 73 %
Platelet Count: 162 10*3/uL (ref 150–400)
RBC: 4.49 MIL/uL (ref 3.87–5.11)
RDW: 13.4 % (ref 11.5–15.5)
WBC Count: 4.5 10*3/uL (ref 4.0–10.5)
nRBC: 0 % (ref 0.0–0.2)

## 2021-08-27 LAB — TSH: TSH: 0.587 u[IU]/mL (ref 0.308–3.960)

## 2021-08-27 MED ORDER — GUAIFENESIN-CODEINE 100-10 MG/5ML PO SOLN: ORAL | 0 refills | Status: DC

## 2021-08-27 MED ORDER — SODIUM CHLORIDE 0.9 % IV SOLN: Freq: Once | INTRAVENOUS | Status: AC

## 2021-08-27 MED ORDER — SODIUM CHLORIDE 0.9 % IV SOLN
480.0000 mg | Freq: Once | INTRAVENOUS | Status: AC
  Administered 2021-08-27: 480 mg via INTRAVENOUS
  Filled 2021-08-27: qty 48

## 2021-08-27 NOTE — Progress Notes (Signed)
Nutrition Follow-up: ? ?Patient completed concurrent chemoradiation for esophageal cancer. She is currently receiving Nivolumab q2 weeks, started 05/21/21. ? ?4/3 - underwent upper endoscopy and dilation of proximal esophageal stricture. Last dilation on 3/16 ? ? ?Met with patient during infusion. She reports eating some yogurt and applesauce. She is drinking water and some juice orally. Patient has not tried soft moist minced foods as this seemed more of a hassle. Patient reports she will likely have additional dilation in a few weeks. She continues using feeding tube. Patient is tolerating 5 cartons Osmolite 1.5. She has brought one carton of formula and supplies to infusion for afternoon feeding. Patient denies nausea, vomiting, diarrhea, constipation. She is requesting to contact Dr. [name] for a refill on cough syrup with codeine. RN present sent pt request via secure chat. ?  ? ?Medications: reviewed  ? ?Labs: Na 134, glucose 137 ? ?Anthropometrics: Weight 133 lb today stable ? ?3/23 - 131 lb 9.6 oz ?3/16 - 127 lb  ?3/10 - 130 lb 14.4 oz  ? ? ?NUTRITION DIAGNOSIS: Inadequate oral intake continues ? ? ?INTERVENTION: ?Encouraged trials of soft moist and minced foods as recommended by Dr. Carlean Purl - handout with ideas provided ?Continue 5 cartons Osmolite 1.5 via tube  ?Assisted pt with bolus feeding during infusion. Patient tolerated 1 carton Osmolite 1.5 without difficulty. Tube flushed with 60 ml water before and after feeding ?  ? ?MONITORING, EVALUATION, GOAL: weight trends, intake, tube feeding  ? ? ?NEXT VISIT: Friday May 5 during infusion ? ? ? ?

## 2021-08-27 NOTE — Progress Notes (Signed)
?[facility]   ?Telephone:(336) [phone] [phone]   ?Clinic Follow up Note  ? ?Patient Care Team: ?[name], [name], MD as PCP - General ?[name], MD as PCP - Cardiology (Cardiology) ?Cindie Crumbly (Neurosurgery) ?Irine Seal, MD as Attending Physician (Urology) ?Erline Levine, MD as Consulting Physician (Neurosurgery) ?[name], MD as Consulting Physician (Neurology) ?Viona Gilmore, [facility] as Pharmacist (Pharmacist) ?[name], MD as Consulting Physician (Gastroenterology) ?Carol Ada, MD as Consulting Physician (Gastroenterology) ?[name], MD (Inactive) as Consulting Physician (Otolaryngology) ?[name], MD as Consulting Physician (Hematology) ?Kyung Rudd, MD as Consulting Physician (Radiation Oncology) ?Kyung Rudd, MD as Consulting Physician (Radiation Oncology) ? ?Date of Service:  08/27/2021 ? ?CHIEF COMPLAINT: f/u of esophageal cancer ? ?CURRENT THERAPY:  ?Nivolumab, started 05/21/21, currently q4weeks ? ?ASSESSMENT & PLAN:  ?[name] is a 81 y.o. female with  ? 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 EGD 9/15 and CT neck 02/05/21 showed 6 cm mass in the cervical/upper esophagus with possible local adenopathy. Path confirmed squamous cell carcinoma of the esophagus, PDL1 testing 95% positive ?-She had feeding tube placed by Dr. Harlow Asa 02/22/21  ?-PET scan 02/23/21 shows hypermetabolic esophageal mass and hypermetabolic paratracheal nodes, negative for distant metastasis.  ?-due to the location in the cervical esophagus, Dr. Kipp Brood feels she is not a surgical candidate.  ?-she received concurrent chemoRT with carbo/taxol 03/08/21 - 04/14/21. She required dose reduction with last cycle due to radiation dermatitis, hoarseness, dysphagia, and odynophagia.  ?-given PDL1+, she began immunotherapy with Nivolumab on 05/21/21. Will plan to give this for a total of a year if  NED. ?-neck CT 06/18/21 showed decrease in size of mass-like enlargement of cervical esophagus and size of lymph nodes. ?-EGD on 06/21/21 by Dr. Carlean Purl showed only inflammation. Biopsy was negative for residual cancer. ?-chest CT 07/15/21 showed resolution of esophageal mass, otherwise NED. ?-labs reviewed, overall adequate to proceed with Nivo today. She is now receiving every 4 weeks. ?  ?2. Dysphagia, weight loss, cough ?-Secondary to #1 ?-She underwent G-tube placement by Dr. Harlow Asa on 02/22/21 ?-she is currently taking in nutrition only by feeding tube, 5 times a day. Weight overall stable. ?-neck CT 06/18/21 showed decrease in size of esophageal mass ?-she continues esophageal dilation under Dr. Carlean Purl, most recently on 08/23/21 ?-she is able to swallow soft foods, such as applesauce. ?-She still has intermittent dry cough, possible related to mild aspiration.  I reviewed her codeine cough syrup ?  ?3. Depression ?-secondary to social isolation and treatment side effects ?-previously discussed anti-depressants, pt declined  ?-since last visit she has increased physical activity and riding along for errands with her husband ?-appears improved, will monitor  ?  ?4. Trigeminal neuralgia, sciatica, headaches ?-On lamotrigene and oxcarbazepine  ?-pain has worsened before and during treatment ?-f/up Dr. Jaynee Eagles  ?  ?5. HTN ?-continue benazepril, furosemide ?-monitoring closely  ?  ?6. Goals of Care/Code Status ?-Code status was DNR from hospitalization 05/16/20, changed to full code during 02/26/21 hospitalization for feeding tube placement   ?  ?  ?PLAN: ?-proceed with Nivo today ?-I called in guaifenesin-codeine cough syrup ?-EGD per Dr. Carlean Purl ?-lab, f/u, and Nivo every 4 weeks ? ? ?No problem-specific Assessment & Plan notes found for this encounter. ? ? ?SUMMARY OF ONCOLOGIC HISTORY: ?Oncology History  ?Malignant neoplasm of upper third esophagus ([place])  ?12/21/2020 Imaging  ? Laryngoscopy Comments:    On fiberoptic  laryngoscopy through the right nostril the nasopharynx was clear.  The base of tongue vallecula and epiglottis were normal.    ?Piriform sinuses were clear bilaterally and vocal cords were clear with  ?normal vocal mobility.  No structural abnormalities noted. ?  ?01/22/2021 Imaging  ? DG esophagus IMPRESSION: ?1. Luminal narrowing in the high cervical esophagus over a 3 cm segment is highly concerning for esophageal neoplasm. Inflammatory process would be a secondary consideration. Luminal narrowing occurs approximately at the C5-C7 vertebral body level just below the glottis. Patient experienced several episodes of choking related to this luminal narrowing. Recommend expedient upper GI endoscopy for evaluation. ?2. Distal thoracic esophagus and GE junction appear normal. ?  ?02/04/2021 Procedure  ? EGD by Dr. Carlean Purl impression- One mass-like severe stenosis was found in the upper third of the esophagus. The stenosis was not traversed. ?  ?02/05/2021 Imaging  ? CT soft tissue neck w contrast IMPRESSION: ?Mass-like soft tissue thickening and mucosal hyperenhancement of the cervical esophagus and hypopharynx, spanning the C4-T1 levels, likely reflecting an esophageal malignancy. This measures up to 2.4 x 3.4 cm in transaxial dimensions, and 6.3 cm in craniocaudal ?dimension. Associated severe effacement of the esophageal lumen. ?  ?Centrally necrotic right paratracheal lymph node beneath the level of the mass, measuring 1.3 x 0.9 cm, and likely reflecting a site of nodal metastatic disease. ?  ?Additional lymph nodes along the posterior and inferior aspect of the right thyroid lobe (along the right aspect of the esophageal mass), which measure subcentimeter but are asymmetrically prominent and highly suspicious for additional sites of nodal metastatic ?disease. ?  ?02/12/2021 Pathology Results  ? FINAL MICROSCOPIC DIAGNOSIS:  ?A. ESOPHAGUS, UPPER CERVICAL #1, BIOPSY:  ?- Squamous cell carcinoma.  ?B. ESOPHAGUS, UPPER  CERVICAL #2, BIOPSY:  ?- Squamous cell carcinoma.  ?  ?02/12/2021 Cancer Staging  ? Staging form: Esophagus - Squamous Cell Carcinoma, AJCC 8th Edition ?- Clinical stage from 02/12/2021: Stage Unknown (cTX, cN2, cM0) - Signed by [name], MD on 03/08/2021 ?Stage prefix: Initial diagnosis ? ?  ?02/18/2021 Initial Diagnosis  ? Malignant neoplasm of upper third esophagus (HCC) ?  ?02/23/2021 PET scan  ? IMPRESSION: ?Markedly hypermetabolic focal masslike thickening of the cervical esophagus, compatible with primary esophageal malignancy. ?  ?Markedly hypermetabolic right upper paratracheal lymph node, ?compatible with metastatic disease. ?  ?Small right upper paratracheal and right level IIA lymph nodes with mild hypermetabolic activity, concerning for additional sites of nodal metastatic disease. ?  ?Aortic Atherosclerosis (ICD10-I70.0). ?  ?03/09/2021 - 04/13/2021 Chemotherapy  ? Patient is on Treatment Plan : ESOPHAGUS Carboplatin/PACLitaxel weekly x 6 weeks with XRT    ?   ?05/21/2021 -  Chemotherapy  ? Patient is on Treatment Plan : GASTROESOPHAGEAL Nivolumab q14d x 8 cycles / Nivolumab q28d  ?   ? ? ? ?INTERVAL HISTORY:  ?[name] is here for a follow up of esophageal cancer. She was last seen by me on 07/30/21. She presents to the clinic accompanied by her husband. ?She reports she is able to swallow applesauce and some solid foods as long as it's moist. She also reports she is tolerating infusion well without side effects. ?  ?All other systems were reviewed with the patient and are negative. ? ?MEDICAL HISTORY:  ?Past Medical History:  ?Diagnosis Date  ? Abdominal pain 05/29/2013  ? s/p rx of cephalo resistant e coli   but last rx NG  now residular ?  bladder sx repeat cx sx rx to  ty and uro consult   ? 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 Qualifier: Diagnosis of  By: [name], [name]   ? Agent resistant to multiple antibiotics 05/29/2013  ? e coli   bu NG on fu.    ?  Anemia   ? Anxiety   ? ARF (acute renal failure) ([place]) 03/12/2015  ? Closed head injury 02/01/2011  ? from syncope and had scalp laceration  neg ct .    ? Closed head injury 5-6 yrs ago  ? Colitis 11/27/2017  ?

## 2021-08-27 NOTE — Patient Instructions (Signed)
[facility]  ? Discharge Instructions: ?Thank you for choosing [facility] to provide your oncology and hematology care.  ? ?If you have a lab appointment with the [facility], please go directly to the [facility] and check in at the registration area. ?  ?Wear comfortable clothing and clothing appropriate for easy access to any Portacath or PICC line.  ? ?We strive to give you quality time with your provider. You may need to reschedule your appointment if you arrive late (15 or more minutes).  Arriving late affects you and other patients whose appointments are after yours.  Also, if you miss three or more appointments without notifying the office, you may be dismissed from the clinic at the provider?s discretion.    ?  ?For prescription refill requests, have your pharmacy contact our office and allow 72 hours for refills to be completed.   ? ?Today you received the following chemotherapy and/or immunotherapy agents: nivolumab    ?  ?To help prevent nausea and vomiting after your treatment, we encourage you to take your nausea medication as directed. ? ?BELOW ARE SYMPTOMS THAT SHOULD BE REPORTED IMMEDIATELY: ?*FEVER GREATER THAN 100.4 F (38 ?C) OR HIGHER ?*CHILLS OR SWEATING ?*NAUSEA AND VOMITING THAT IS NOT CONTROLLED WITH YOUR NAUSEA MEDICATION ?*UNUSUAL SHORTNESS OF BREATH ?*UNUSUAL BRUISING OR BLEEDING ?*URINARY PROBLEMS (pain or burning when urinating, or frequent urination) ?*BOWEL PROBLEMS (unusual diarrhea, constipation, pain near the anus) ?TENDERNESS IN MOUTH AND THROAT WITH OR WITHOUT PRESENCE OF ULCERS (sore throat, sores in mouth, or a toothache) ?UNUSUAL RASH, SWELLING OR PAIN  ?UNUSUAL VAGINAL DISCHARGE OR ITCHING  ? ?Items with * indicate a potential emergency and should be followed up as soon as possible or go to the Emergency Department if any problems should occur. ? ?Please show the CHEMOTHERAPY ALERT CARD or IMMUNOTHERAPY ALERT CARD at check-in  to the Emergency Department and triage nurse. ? ?Should you have questions after your visit or need to cancel or reschedule your appointment, please contact [facility]  Dept: [phone]  and follow the prompts.  Office hours are 8:00 a.m. to 4:30 p.m. Monday - Friday. Please note that voicemails left after 4:00 p.m. may not be returned until the following business day.  We are closed weekends and major holidays. You have access to a nurse at all times for urgent questions. Please call the main number to the clinic Dept: [phone] and follow the prompts. ? ? ?For any non-urgent questions, you may also contact your provider using MyChart. We now offer e-Visits for anyone 24 and older to request care online for non-urgent symptoms. For details visit [email or web address]. ?  ?Also download the MyChart app! Go to the app store, search "MyChart", open the app, select [facility], and log in with your MyChart username and password. ? ?Due to Covid, a mask is required upon entering the hospital/clinic. If you do not have a mask, one will be given to you upon arrival. For doctor visits, patients may have 1 support person aged 44 or older with them. For treatment visits, patients cannot have anyone with them due to current Covid guidelines and our immunocompromised population.  ? ?

## 2021-08-28 LAB — T4: T4, Total: 6.8 ug/dL (ref 4.5–12.0)

## 2021-08-30 DIAGNOSIS — C159 Malignant neoplasm of esophagus, unspecified: Secondary | ICD-10-CM | POA: Diagnosis not present

## 2021-08-30 DIAGNOSIS — R1310 Dysphagia, unspecified: Secondary | ICD-10-CM | POA: Diagnosis not present

## 2021-09-07 ENCOUNTER — Ambulatory Visit (HOSPITAL_COMMUNITY): Admission: RE | Admit: 2021-09-07 | Payer: Medicare Other | Source: Ambulatory Visit

## 2021-09-07 ENCOUNTER — Other Ambulatory Visit (HOSPITAL_COMMUNITY): Payer: Self-pay | Admitting: Interventional Radiology

## 2021-09-07 ENCOUNTER — Ambulatory Visit (HOSPITAL_COMMUNITY)
Admission: RE | Admit: 2021-09-07 | Discharge: 2021-09-07 | Disposition: A | Discharge status: Home / Self Care | Payer: Medicare Other | Source: Ambulatory Visit | Attending: Surgery | Admitting: Surgery

## 2021-09-07 ENCOUNTER — Ambulatory Visit (HOSPITAL_COMMUNITY)
Admission: RE | Admit: 2021-09-07 | Discharge: 2021-09-07 | Disposition: A | Discharge status: Home / Self Care | Payer: Medicare Other | Source: Ambulatory Visit | Attending: Interventional Radiology | Admitting: Interventional Radiology

## 2021-09-07 ENCOUNTER — Other Ambulatory Visit (HOSPITAL_COMMUNITY): Payer: Self-pay | Admitting: Surgery

## 2021-09-07 DIAGNOSIS — K9423 Gastrostomy malfunction: Secondary | ICD-10-CM | POA: Insufficient documentation

## 2021-09-07 DIAGNOSIS — Z931 Gastrostomy status: Secondary | ICD-10-CM

## 2021-09-07 DIAGNOSIS — Z4682 Encounter for fitting and adjustment of non-vascular catheter: Secondary | ICD-10-CM | POA: Diagnosis not present

## 2021-09-07 DIAGNOSIS — Z431 Encounter for attention to gastrostomy: Secondary | ICD-10-CM | POA: Diagnosis not present

## 2021-09-07 DIAGNOSIS — M160 Bilateral primary osteoarthritis of hip: Secondary | ICD-10-CM | POA: Diagnosis not present

## 2021-09-07 HISTORY — PX: IR REPLACE G-TUBE SIMPLE WO FLUORO: IMG2323

## 2021-09-07 MED ORDER — SILVER NITRATE-POT NITRATE 75-25 % EX MISC
CUTANEOUS | Status: AC
  Filled 2021-09-07: qty 10

## 2021-09-07 MED ORDER — LIDOCAINE VISCOUS HCL 2 % MT SOLN
OROMUCOSAL | Status: AC
  Filled 2021-09-07: qty 15

## 2021-09-07 MED ORDER — SILVER NITRATE-POT NITRATE 75-25 % EX MISC
CUTANEOUS | Status: DC | PRN
  Administered 2021-09-07: 2 via TOPICAL

## 2021-09-07 MED ORDER — IOHEXOL 300 MG/ML  SOLN
50.0000 mL | Freq: Once | INTRAMUSCULAR | Status: AC | PRN
  Administered 2021-09-07: 50 mL

## 2021-09-07 MED ORDER — LIDOCAINE VISCOUS HCL 2 % MT SOLN
OROMUCOSAL | Status: DC | PRN
  Administered 2021-09-07: 5 mL via OROMUCOSAL

## 2021-09-07 NOTE — Procedures (Signed)
Pt's existing 20 french balloon retention gastrostomy tube was replaced for new 20 fr balloon retention G tube without immediate complications. 10 cc saline instilled into balloon and tube secured to skin site. Silver nitrate( 2 sticks) was applied to surrounding insertion site granulation tissue. Med used- viscous lidocaine into insertion site tract. EBL none. F/u KUB demonstrated appropriate positioning of tube.  ?

## 2021-09-08 ENCOUNTER — Telehealth: Payer: Self-pay

## 2021-09-08 ENCOUNTER — Other Ambulatory Visit: Payer: Self-pay

## 2021-09-08 DIAGNOSIS — G500 Trigeminal neuralgia: Secondary | ICD-10-CM | POA: Diagnosis not present

## 2021-09-08 DIAGNOSIS — K222 Esophageal obstruction: Secondary | ICD-10-CM

## 2021-09-08 DIAGNOSIS — R1310 Dysphagia, unspecified: Secondary | ICD-10-CM

## 2021-09-08 NOTE — Telephone Encounter (Signed)
-----   Message from [name], MD sent at 09/03/2021  5:11 PM EDT ----- ?Regarding: ? procedure for 4/26 or 5/3 ?Call hospital ([facility]) and speak to Tory Emerald or Debi Mays to see if I can do EGD with Savary and fluor on this patient at about noon on either of these days ? ?If so please schedule and let me and patient know ? ?Thanks ? ?CEG ? ?

## 2021-09-08 NOTE — Telephone Encounter (Signed)
Left message for pt to call back  °

## 2021-09-09 ENCOUNTER — Other Ambulatory Visit: Payer: Self-pay

## 2021-09-09 DIAGNOSIS — K222 Esophageal obstruction: Secondary | ICD-10-CM

## 2021-09-09 DIAGNOSIS — R1310 Dysphagia, unspecified: Secondary | ICD-10-CM

## 2021-09-09 NOTE — Telephone Encounter (Signed)
Spoke with Debi Mays at [place] to see if Dr Carlean Purl  can do EGD with Savary and fluor: Debi stated that Dr. Carlean Purl could do the EGD at 12:00 on 09/22/2021: ?EGD ordered and scheduled for 09/22/2021 at 12:00 at [place]: Pt made aware: Pt to arrive at [facility] at 10:30 AM: Pt made aware ?Prep instructions were created and sent to pt via My Chart: Pt and Pt Husband John Made aware ?Ambulatory GI referral placed: ?Pt and Husband John  verbalized understanding with all questions answered.  ? ?

## 2021-09-21 ENCOUNTER — Other Ambulatory Visit: Payer: Self-pay | Admitting: Internal Medicine

## 2021-09-21 ENCOUNTER — Telehealth: Payer: Self-pay | Admitting: Internal Medicine

## 2021-09-21 NOTE — Telephone Encounter (Signed)
Pt is aware we are waiting on md to approve her bp med and pt is requesting to speak with cma concerning handling the medication differently ?

## 2021-09-22 ENCOUNTER — Ambulatory Visit (HOSPITAL_COMMUNITY)
Admission: RE | Admit: 2021-09-22 | Discharge: 2021-09-22 | Disposition: A | Discharge status: Home / Self Care | Payer: Medicare Other | Source: Ambulatory Visit | Attending: Internal Medicine | Admitting: Internal Medicine

## 2021-09-22 ENCOUNTER — Encounter (HOSPITAL_COMMUNITY): Payer: Self-pay | Admitting: Internal Medicine

## 2021-09-22 ENCOUNTER — Ambulatory Visit (HOSPITAL_BASED_OUTPATIENT_CLINIC_OR_DEPARTMENT_OTHER): Payer: Medicare Other | Admitting: Anesthesiology

## 2021-09-22 ENCOUNTER — Encounter (HOSPITAL_COMMUNITY)
Admission: RE | Disposition: A | Discharge status: Home / Self Care | Payer: Self-pay | Source: Ambulatory Visit | Attending: Internal Medicine

## 2021-09-22 ENCOUNTER — Ambulatory Visit (HOSPITAL_COMMUNITY): Payer: Medicare Other

## 2021-09-22 ENCOUNTER — Ambulatory Visit (HOSPITAL_COMMUNITY): Payer: Medicare Other | Admitting: Anesthesiology

## 2021-09-22 DIAGNOSIS — Z931 Gastrostomy status: Secondary | ICD-10-CM | POA: Diagnosis not present

## 2021-09-22 DIAGNOSIS — F418 Other specified anxiety disorders: Secondary | ICD-10-CM

## 2021-09-22 DIAGNOSIS — R1310 Dysphagia, unspecified: Secondary | ICD-10-CM | POA: Diagnosis not present

## 2021-09-22 DIAGNOSIS — Y842 Radiological procedure and radiotherapy as the cause of abnormal reaction of the patient, or of later complication, without mention of misadventure at the time of the procedure: Secondary | ICD-10-CM | POA: Insufficient documentation

## 2021-09-22 DIAGNOSIS — M1990 Unspecified osteoarthritis, unspecified site: Secondary | ICD-10-CM | POA: Diagnosis not present

## 2021-09-22 DIAGNOSIS — Z8501 Personal history of malignant neoplasm of esophagus: Secondary | ICD-10-CM | POA: Insufficient documentation

## 2021-09-22 DIAGNOSIS — K222 Esophageal obstruction: Secondary | ICD-10-CM

## 2021-09-22 DIAGNOSIS — I10 Essential (primary) hypertension: Secondary | ICD-10-CM

## 2021-09-22 DIAGNOSIS — Z79899 Other long term (current) drug therapy: Secondary | ICD-10-CM | POA: Diagnosis not present

## 2021-09-22 HISTORY — PX: ESOPHAGOGASTRODUODENOSCOPY (EGD) WITH PROPOFOL: SHX5813

## 2021-09-22 HISTORY — PX: SAVORY DILATION: SHX5439

## 2021-09-22 SURGERY — ESOPHAGOGASTRODUODENOSCOPY (EGD) WITH PROPOFOL
Anesthesia: Monitor Anesthesia Care

## 2021-09-22 MED ORDER — ROCURONIUM BROMIDE 100 MG/10ML IV SOLN
INTRAVENOUS | Status: DC | PRN
  Administered 2021-09-22: 20 mg via INTRAVENOUS
  Administered 2021-09-22: 5 mg via INTRAVENOUS

## 2021-09-22 MED ORDER — MIDAZOLAM HCL 2 MG/2ML IJ SOLN
INTRAMUSCULAR | Status: AC
Start: 2021-09-22 — End: ?
  Filled 2021-09-22: qty 2

## 2021-09-22 MED ORDER — PROPOFOL 10 MG/ML IV BOLUS
INTRAVENOUS | Status: DC | PRN
  Administered 2021-09-22: 100 mg via INTRAVENOUS

## 2021-09-22 MED ORDER — FENTANYL CITRATE (PF) 100 MCG/2ML IJ SOLN: 25.0000 ug | INTRAMUSCULAR | Status: DC | PRN

## 2021-09-22 MED ORDER — FENTANYL CITRATE (PF) 100 MCG/2ML IJ SOLN
INTRAMUSCULAR | Status: DC | PRN
  Administered 2021-09-22 (×4): 25 ug via INTRAVENOUS

## 2021-09-22 MED ORDER — ONDANSETRON HCL 4 MG/2ML IJ SOLN
INTRAMUSCULAR | Status: DC | PRN
  Administered 2021-09-22: 4 mg via INTRAVENOUS

## 2021-09-22 MED ORDER — HYDRALAZINE HCL 20 MG/ML IJ SOLN
INTRAMUSCULAR | Status: DC | PRN
  Administered 2021-09-22: 2.5 mg via INTRAVENOUS

## 2021-09-22 MED ORDER — DEXAMETHASONE SODIUM PHOSPHATE 10 MG/ML IJ SOLN
INTRAMUSCULAR | Status: DC | PRN
  Administered 2021-09-22: 5 mg via INTRAVENOUS

## 2021-09-22 MED ORDER — ONDANSETRON HCL 4 MG/2ML IJ SOLN: 4.0000 mg | Freq: Once | INTRAMUSCULAR | Status: DC | PRN

## 2021-09-22 MED ORDER — OXYCODONE HCL 5 MG/5ML PO SOLN: 5.0000 mg | Freq: Once | ORAL | Status: DC | PRN

## 2021-09-22 MED ORDER — SUGAMMADEX SODIUM 200 MG/2ML IV SOLN
INTRAVENOUS | Status: DC | PRN
  Administered 2021-09-22: 200 mg via INTRAVENOUS

## 2021-09-22 MED ORDER — LIDOCAINE HCL 1 % IJ SOLN
INTRAMUSCULAR | Status: DC | PRN
  Administered 2021-09-22: 50 mg via INTRADERMAL

## 2021-09-22 MED ORDER — FENTANYL CITRATE (PF) 100 MCG/2ML IJ SOLN
INTRAMUSCULAR | Status: AC
  Filled 2021-09-22: qty 2

## 2021-09-22 MED ORDER — OXYCODONE HCL 5 MG PO TABS: 5.0000 mg | ORAL_TABLET | Freq: Once | ORAL | Status: DC | PRN

## 2021-09-22 MED ORDER — LACTATED RINGERS IV SOLN: INTRAVENOUS | Status: DC

## 2021-09-22 MED ORDER — SUCCINYLCHOLINE CHLORIDE 200 MG/10ML IV SOSY
PREFILLED_SYRINGE | INTRAVENOUS | Status: DC | PRN
  Administered 2021-09-22: 120 mg via INTRAVENOUS

## 2021-09-22 MED ORDER — AMISULPRIDE (ANTIEMETIC) 5 MG/2ML IV SOLN: 10.0000 mg | Freq: Once | INTRAVENOUS | Status: DC | PRN

## 2021-09-22 MED ORDER — SODIUM CHLORIDE 0.9 % IV SOLN: INTRAVENOUS | Status: DC

## 2021-09-22 SURGICAL SUPPLY — 15 items

## 2021-09-22 NOTE — H&P (Signed)
?[facility] History and Physical ? ? ?Primary Care Physician:  [name], MD ? ? 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 ?Plan:    Upper GI endoscopy and esophageal dilation radiation-induced esophageal stricture and dysphagia ? ? ? ? ?HPI: [name] is a 81 y.o. 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  She has required repeated dilations and was last seen and underwent esophageal dilation to 11 mm on August 23, 2021.  She is tolerating foods such as applesauce, chicken rice soup, egg drop soup, and diluted mashed potatoes (chicken broth dilution).  She is handling her secretions, at one point she was unable to. ? ? ?Past Medical History:  ?Diagnosis Date  ? Abdominal pain 05/29/2013  ? s/p rx of cephalo resistant e coli   but last rx NG  now residular ?  bladder sx repeat cx sx rx to ty and uro consult   ? 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 Qualifier: Diagnosis of  By: [name], [name]   ? Agent resistant to multiple antibiotics 05/29/2013  ? e coli   bu NG on fu.    ? Anemia   ? Anxiety   ? ARF (acute renal failure) ([place]) 03/12/2015  ? Closed head injury 02/01/2011  ? from syncope and had scalp laceration  neg ct .    ? Closed head injury 5-6 yrs ago  ? Colitis 11/27/2017  ? Complication of anesthesia   ? migraine several hours after general anesthesia  ? Depression   ? esophageal ca 01/2021  ? Fatty liver   ? Gall stones 2016  ? see ct scan neg HIDA   ? GERD (gastroesophageal reflux disease)   ? Hearing aid worn   ? 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 Hyperlipidemia   ? Hypertension   ? echo nl lv function  mild dilitation 2009  ? Kidney infection   ? few yrs ago in hospital  ? Medication side effect 09/02/2010  ? Poss muscle se of 10 crestor   ? Migraine   ? hypnic HA eval by Dr. Earley Favor in the past  ?  Polycythemia   ? 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 Pyelonephritis 03/12/2015  ? Sensation of pain in anesthetized distribution of trigeminal nerve   ? Syncope 02/01/2011  ? In shower on vacation  sustained head laceration  8 sutures Had ed visit neg head ct labs and x ray   ? Trigeminal neuralgia pain   ? ? ?Past Surgical History:  ?Procedure Laterality Date  ? ABDOMINAL HYSTERECTOMY  2002  ? tubal  ? BACK SURGERY    ? 2 times, for sciatic nerve pain  ? BALLOON DILATION  06/21/2021  ? Procedure: DILATION BALLOON used for esophageal stricture;  Surgeon: Rush Landmark Telford Nab., MD;  Location: [facility];  Service: Gastroenterology;;  ? CARDIAC CATHETERIZATION  2000  ? chest pains neg  ? CHOLECYSTECTOMY N/A 02/21/2017  ? Procedure: LAPAROSCOPIC CHOLECYSTECTOMY WITH INTRAOPERATIVE CHOLANGIOGRAM;  Surgeon: Armandina Gemma, MD;  Location: [facility];  Service: General;  Laterality: N/A;  ? COLONOSCOPY    ? multiple  ? CRANIOTOMY  12/09/2011  ? nerve decompression right trigeminal   ? DIRECT LARYNGOSCOPY N/A 02/12/2021  ? Procedure: DIRECT LARYNGOSCOPY AND BIOPSY POSSIBLE FROZEN;  Surgeon: [name], MD;  Location: [place];  Service: ENT;  Laterality: N/A;  ? DOPPLER ECHOCARDIOGRAPHY  2009  ? nl lv function mild lv dilitation  ? ESOPHAGOGASTRODUODENOSCOPY (EGD) WITH PROPOFOL N/A 06/21/2021  ? Procedure: ESOPHAGOGASTRODUODENOSCOPY (EGD) WITH PROPOFOL;  Surgeon: Rush Landmark Telford Nab., MD;  Location: [facility];  Service: Gastroenterology;  Laterality: N/A;  Request Fluoroscopy; Plan in for dilation  ? ESOPHAGOGASTRODUODENOSCOPY (EGD) WITH PROPOFOL N/A 07/19/2021  ? Procedure: ESOPHAGOGASTRODUODENOSCOPY (EGD) WITH PROPOFOL;  Surgeon: Rush Landmark Telford Nab., MD;  [facility];  Service: Endoscopy;  Laterality: N/A;  Dilation fluoro  ? ESOPHAGOGASTRODUODENOSCOPY (EGD) WITH PROPOFOL N/A 08/05/2021  ? Procedure: ESOPHAGOGASTRODUODENOSCOPY (EGD) WITH PROPOFOL - fluoro;  Surgeon: Mansouraty,  Telford Nab., MD;  Location: [place];  Service: Gastroenterology;  Laterality: N/A;  ? ESOPHAGOGASTRODUODENOSCOPY (EGD) WITH PROPOFOL N/A 08/23/2021  ? Procedure: ESOPHAGOGASTRODUODENOSCOPY (EGD) WITH PROPOFOL;  Surgeon: [name], MD;  [facility];  Service: Gastroenterology;  Laterality: N/A;  Using Fluoroscopy and esophageal dilation  ? EYE SURGERY Bilateral   ? ioc for catatracts  ? GASTROSTOMY N/A 02/26/2021  ? Procedure: OPEN GASTROSTOMY TUBE PLACEMENT;  Surgeon: Armandina Gemma, MD;  Location: [facility];  Service: General;  Laterality: N/A;  ? IR GASTROSTOMY TUBE MOD SED  02/22/2021  ? IR REPLACE G-TUBE SIMPLE WO FLUORO  09/07/2021  ? laparoscopic gallbladder surgery  02/16/2017  ? F[facility]  ? OOPHORECTOMY Bilateral 2002  ? rt shoulder surgery    ? SAVORY DILATION N/A 06/21/2021  ? Procedure: SAVORY DILATION;  Surgeon: Irving Copas., MD;  Location: [facility];  Service: Gastroenterology;  Laterality: N/A;  ? SAVORY DILATION N/A 07/19/2021  ? Procedure: SAVORY DILATION;  Surgeon: Irving Copas., MD;  [facility];  Service: Endoscopy;  Laterality: N/A;  ? SAVORY DILATION N/A 08/05/2021  ? Procedure: SAVORY DILATION;  Surgeon: Irving Copas., MD;  Location: [place];  Service: Gastroenterology;  Laterality: N/A;  ? SAVORY DILATION N/A 08/23/2021  ? Procedure: SAVORY DILATION;  Surgeon: [name], MD;  Location: [facility];  Service: Gastroenterology;  Laterality: N/A;  ? ? ?Prior to Admission medications   ?Medication Sig Start Date End Date Taking? Authorizing Provider  ?benazepril (LOTENSIN) 20 MG tablet TAKE 1 TABLET(20 MG) BY MOUTH TWICE DAILY ?Patient taking differently: Place 20 mg into feeding tube every morning. 09/21/21  Yes [name], [name], MD  ?carvedilol (COREG) 25 MG tablet TAKE 1 TABLET(25 MG) BY MOUTH TWICE DAILY WITH A MEAL ?Patient taking differently: Place 25 mg into feeding tube 2 (two) times daily. 07/19/21  Yes  [name], [name], MD  ?conjugated estrogens (PREMARIN) vaginal cream Place 1 applicator vaginally once a week.   Yes [provider]  ?furosemide (LASIX) 20 MG tablet TAKE 1 TABLET(20 MG) BY MOUTH DAILY ?Patient taking differently: Place 20 mg into feeding tube every morning. 08/16/21  Yes [name], [name], MD  ?guaiFENesin-codeine 100-10 MG/5ML syrup TAKE 5 ML BY MOUTH EVERY 6 HOURS AS NEEDED FOR COUGH ?Patient taking differently: Place 5 mLs into feeding tube 2 (two) times daily as needed for cough. 08/27/21  Yes [name], MD  ?lamoTRIgine (LAMICTAL) 200 MG tablet Take 200 mg by mouth See admin instructions. 250 mg twice daily 04/04/21  Yes [provider]  ?Multiple Vitamin (MULTIVITAMIN ADULT PO) Take 1 tablet by mouth daily. One a day   Yes [provider]  ?omeprazole (PRILOSEC) 20 MG capsule Take 20 mg by mouth daily.   Yes [provider]  ?  ondansetron (ZOFRAN-ODT) 4 MG disintegrating tablet Take 1-2 tablets (4-8 mg total) by mouth every 8 (eight) hours as needed for nausea or vomiting. 07/16/21  Yes [name], MD  ?Oxcarbazepine (TRILEPTAL) 150 MG tablet Take 1 tablet (150 mg total) by mouth 2 (two) times daily. Take 300 mg in the morning and 450 mg in the evening ?Patient taking differently: Take 150 mg by mouth in the morning, at noon, and at bedtime. 08/12/21  Yes [name], NP  ?prochlorperazine (COMPAZINE) 10 MG tablet TAKE 1 TABLET(10 MG) BY MOUTH EVERY 6 HOURS AS NEEDED FOR NAUSEA OR VOMITING 06/17/21  Yes [name], MD  ?sucralfate (CARAFATE) 1 GM/10ML suspension Take 10 mLs (1 g total) by mouth 4 (four) times daily -  with meals and at bedtime. 08/23/21  Yes [name], MD  ?fluticasone Asencion Islam) 50 MCG/ACT nasal spray Place 2 sprays into both nostrils as needed for allergies or rhinitis. ?Patient not taking: Reported on 09/22/2021 02/11/20   [name], MD  ?loperamide (IMODIUM) 2 MG capsule Take 2 mg by mouth as needed for diarrhea or loose stools.     [provider]  ?Multiple Minerals-Vitamins (CAL-MAG-ZINC-D PO) Take 2 tablets by mouth daily.    [provider]  ?Nutritional Supplements (FEEDING SUPPLEMENT, OSMOLITE 1.5 CAL,) LIQD Place 237 mLs

## 2021-09-22 NOTE — Anesthesia Preprocedure Evaluation (Addendum)
Anesthesia Evaluation  ?Patient identified by MRN, date of birth, ID band ?Patient awake ? ? ? ?Reviewed: ?Allergy & Precautions, NPO status , Patient's Chart, lab work & pertinent test results ? ?Airway ?Mallampati: II ? ?TM Distance: >3 FB ?Neck ROM: Full ? ? ? Dental ?no notable dental hx. ? ?  ?Pulmonary ?neg pulmonary ROS,  ?  ?Pulmonary exam normal ? ? ? ? ? ? ? Cardiovascular ?hypertension, Pt. on medications and Pt. on home beta blockers ? ?Rhythm:Regular Rate:Normal ? ? ?  ?Neuro/Psych ? Headaches, Anxiety Depression   ? GI/Hepatic ?Neg liver ROS, GERD  Medicated,dysphagia ?  ?Endo/Other  ?negative endocrine ROS ? Renal/GU ?Renal disease  ?negative genitourinary ?  ?Musculoskeletal ? ?(+) Arthritis , Osteoarthritis,   ? Abdominal ?Normal abdominal exam  (+)   ?Peds ? Hematology ? ?(+) Blood dyscrasia, anemia ,   ?Anesthesia Other Findings ? ? Reproductive/Obstetrics ? ?  ? ? ? ? ? ? ? ? ? ? ? ? ? ?  ?  ? ? ? ? ? ? ? ?Anesthesia Physical ?Anesthesia Plan ? ?ASA: 3 ? ?Anesthesia Plan: General  ? ?Post-op Pain Management:   ? ?Induction: Intravenous, Rapid sequence and Cricoid pressure planned ? ?PONV Risk Score and Plan: 2 and Propofol infusion, TIVA and Treatment may vary due to age or medical condition ? ?Airway Management Planned: Natural Airway and Nasal Cannula ? ?Additional Equipment: None ? ?Intra-op Plan:  ? ?Post-operative Plan: Extubation in OR ? ?Informed Consent: I have reviewed the patients History and Physical, chart, labs and discussed the procedure including the risks, benefits and alternatives for the proposed anesthesia with the patient or authorized representative who has indicated his/her understanding and acceptance.  ? ? ? ? ? ?Plan Discussed with: Anesthesiologist and CRNA ? ?Anesthesia Plan Comments:   ? ? ? ? ? ?Anesthesia Quick Evaluation ? ?

## 2021-09-22 NOTE — Progress Notes (Signed)
Dr. Elgie Congo notified about high BP s/p fentanyl and hydralzine dose.  Instructed pt to take BP meds when she gets home. ?

## 2021-09-22 NOTE — Anesthesia Procedure Notes (Signed)
Procedure Name: Intubation ?Date/Time: 09/22/2021 12:19 PM ?Performed by: Garrel Ridgel, CRNA ?Pre-anesthesia Checklist: Patient identified, Emergency Drugs available, Suction available and Patient being monitored ?Patient Re-evaluated:Patient Re-evaluated prior to induction ?Oxygen Delivery Method: Circle system utilized ?Preoxygenation: Pre-oxygenation with 100% oxygen ?Induction Type: IV induction ?Ventilation: Mask ventilation without difficulty ?Laryngoscope Size: Mac and 3 ?Grade View: Grade II ?Tube type: Oral ?Tube size: 7.0 mm ?Number of attempts: 1 ?Airway Equipment and Method: Stylet and Oral airway ?Placement Confirmation: ETT inserted through vocal cords under direct vision, positive ETCO2 and breath sounds checked- equal and bilateral ?Secured at: 23 cm ?Tube secured with: Tape ?Dental Injury: Teeth and Oropharynx as per pre-operative assessment  ? ? ? ? ?

## 2021-09-22 NOTE — Transfer of Care (Signed)
Immediate Anesthesia Transfer of Care Note ? ?Patient: [name] ? ?Procedure(s) Performed: ESOPHAGOGASTRODUODENOSCOPY (EGD) WITH PROPOFOL ?SAVORY DILATION ? ?Patient Location: PACU and Endoscopy Unit ? ?Anesthesia Type:General ? ?Level of Consciousness: awake, alert , oriented and patient cooperative ? ?Airway & Oxygen Therapy: Patient Spontanous Breathing and Patient connected to face mask oxygen ? ?Post-op Assessment: Report given to RN and Post -op Vital signs reviewed and stable ? ?Post vital signs: Reviewed and stable ? ?Last Vitals:  ?Vitals Value Taken Time  ?BP 211/94 09/22/21 1311  ?Temp 36.3 ?C 09/22/21 1310  ?Pulse 60 09/22/21 1317  ?Resp 13 09/22/21 1317  ?SpO2 100 % 09/22/21 1317  ?Vitals shown include unvalidated device data. ? ?Last Pain:  ?Vitals:  ? 09/22/21 1311  ?TempSrc:   ?PainSc: 2   ?   ? ?  ? ?Complications: No notable events documented. ?

## 2021-09-22 NOTE — Op Note (Signed)
[facility] ?Patient Name: [name] ?Procedure Date: 09/22/2021 ?MRN: [id number] ?Attending MD: [name] , MD ?Date of Birth: 28-Aug-1940 ?CSN: [id number] ?Age: 81 ?Admit Type: Outpatient ?Procedure:                Upper GI endoscopy ?Indications:              Dysphagia, Stricture of the esophagus, For therapy  ?                          of esophageal stricture ?Providers:                [name], MD, Grace Isaac, RN, Cindee Salt  ?                          Teaching laboratory technician ?Referring MD:              ?Medicines:                General Anesthesia ?Complications:            No immediate complications. ?Estimated Blood Loss:     Estimated blood loss was minimal. ?Procedure:                Pre-Anesthesia Assessment: ?                          - Prior to the procedure, a History and Physical  ?                          was performed, and patient medications and  ?                          allergies were reviewed. The patient's tolerance of  ?                          previous anesthesia was also reviewed. The risks  ?                          and benefits of the procedure and the sedation  ?                          options and risks were discussed with the patient.  ?                          All questions were answered, and informed consent  ?                          was obtained. Prior Anticoagulants: The patient has  ?                          taken no previous anticoagulant or antiplatelet  ?                          agents. ASA Grade Assessment: III - A patient with  ?  severe systemic disease. After reviewing the risks  ?                          and benefits, the patient was deemed in  ?                          satisfactory condition to undergo the procedure. ?                          After obtaining informed consent, the endoscope was  ?                          passed under direct vision. Throughout the  ?                          procedure, the patient's  blood pressure, pulse, and  ?                          oxygen saturations were monitored continuously. The  ?                          GIF-H190 ([id number]) Olympus endoscope was introduced  ?                          through the mouth, and advanced to the antrum of  ?                          the stomach. The upper GI endoscopy was  ?                          accomplished without difficulty. The patient  ?                          tolerated the procedure well. ?Scope In: ?Scope Out: ?Findings: ?     One benign-appearing, intrinsic moderate (circumferential scarring or  ?     stenosis; an endoscope may pass) stenosis was found in the proximal  ?     esophagus. This stenosis measured 7 mm (inner diameter) x 6 cm (in  ?     length). The stenosis was traversed. A guidewire was placed using  ?     fluoroscopy and and the scope was withdrawn. Dilation was performed with  ?     a Savary dilator from 8-12 mm (8,9,10,11,12). No resistance at 8 mm,  ?     mild at 9, 10, 11 and moderate at 12 mm. Re-inspection after 10, 11 and  ?     12 mm. Good result. ?     There was evidence of a gastrostomy present in the gastric antrum. This  ?     was characterized by healthy appearing mucosa. ?     The exam was otherwise without abnormality. ?Impression:               - Benign-appearing esophageal stenosis. Dilated. ?                          - Gastrostomy present characterized by healthy  ?  appearing mucosa. ?                          - The examination was otherwise normal. ?                          - No specimens collected. ?Moderate Sedation: ?     Not Applicable - Patient had care per Anesthesia. ?Recommendation:           - Patient has a contact number available for  ?                          emergencies. The signs and symptoms of potential  ?                          delayed complications were discussed with the  ?                          patient. Return to normal activities tomorrow.  ?                           Written discharge instructions were provided to the  ?                          patient. ?                          - Clear liquids x 2 hours then full liquidds rest  ?                          of day. Start prior diet tomorrow. Moist and minced ?                          - I WILL ARRANGE REPEAT ESOPHAGEAL DILATION HOPING  ?                          WITHIN 2-4 WEEKS ?                          MY OFFICE WILL REFER TO SPEECH PATHOLOGY TO SEE IF  ?                          THEY CAN PROVIDE ADDITIONAL HELP GIVEN LOCATION OF  ?                          STRICTURE (VERY PROXIMAL) ?Procedure Code(s):        --- Professional --- ?                          [phone], Esophagoscopy, flexible, transoral; with  ?                          insertion of guide wire followed by passage of  ?  dilator(s) over guide wire ?Diagnosis Code(s):        --- Professional --- ?                          K22.2, Esophageal obstruction ?                          Z93.1, Gastrostomy status ?                          R13.10, Dysphagia, unspecified ?CPT copyright 2019 [employer] Association. All rights reserved. ?The codes documented in this report are preliminary and upon coder review may  ?be revised to meet current compliance requirements. ?[name], MD ?09/22/2021 1:09:35 PM ?This report has been signed electronically. ?Number of Addenda: 0 ?

## 2021-09-22 NOTE — Discharge Instructions (Addendum)
I was able to dilate the 12 mm today.  Last time maximum 11 mm and this time the scope went through without dilating so you are making progress. ? ?Stay on liquids today (clear liquids only until 3 PM) only and then try eating soft foods tomorrow.  Stick with the minced and moist foods. ? ?I will work on scheduling another dilation.  I am also going to have you see the speech pathologist for a swallowing study.  The location of the stricture (very high and close to the throat) is probably having some effect on your swallowing and they may be able to help improve things also. ? ?I appreciate the opportunity to care for you. ?[name], MD, [facility]al ? ? ?YOU HAD AN ENDOSCOPIC PROCEDURE TODAY: Refer to the procedure report and other information in the discharge instructions given to you for any specific questions about what was found during the examination. If this information does not answer your questions, please call Dr. Celesta Aver office at [phone] to clarify.  ? ?YOU SHOULD EXPECT: Some feelings of bloating in the abdomen. Passage of more gas than usual. Walking can help get rid of the air that was put into your GI tract during the procedure and reduce the bloating. If you had a lower endoscopy (such as a colonoscopy or flexible sigmoidoscopy) you may notice spotting of blood in your stool or on the toilet paper. Some abdominal soreness may be present for a day or two, also. ? ?DIET: Liquids only today then try minced and moist foods.  Clear liquids only until 3 PM. ? ?ACTIVITY: Your care partner should take you home directly after the procedure. You should plan to take it easy, moving slowly for the rest of the day. You can resume normal activity the day after the procedure however YOU SHOULD NOT DRIVE, use power tools, machinery or perform tasks that involve climbing or major physical exertion for 24 hours (because of the sedation medicines used during the test).  ? ?SYMPTOMS TO REPORT IMMEDIATELY: ?A  gastroenterologist can be reached at any hour. Please call [phone]  for any of the following symptoms:  ?Following lower endoscopy (colonoscopy, flexible sigmoidoscopy) ?Excessive amounts of blood in the stool  ?Significant tenderness, worsening of abdominal pains  ?Swelling of the abdomen that is new, acute  ?Fever of 100? or higher  ?Following upper endoscopy (EGD, EUS, ERCP, esophageal dilation) ?Vomiting of blood or coffee ground material  ?New, significant abdominal pain  ?New, significant chest pain or pain under the shoulder blades  ?Painful or persistently difficult swallowing  ?New shortness of breath  ?Black, tarry-looking or red, bloody stools ? ?FOLLOW UP:  ?If any biopsies were taken you will be contacted by phone or by letter within the next 1-3 weeks. Call [phone]  if you have not heard about the biopsies in 3 weeks.  ?Please also call with any specific questions about appointments or follow up tests. ? ?

## 2021-09-22 NOTE — Anesthesia Postprocedure Evaluation (Addendum)
Anesthesia Post Note ? ?Patient: [name] ? ?Procedure(s) Performed: ESOPHAGOGASTRODUODENOSCOPY (EGD) WITH PROPOFOL ?SAVORY DILATION ? ?  ? ?Patient location during evaluation: PACU ?Anesthesia Type: General ?Level of consciousness: awake and alert ?Pain management: pain level controlled ?Vital Signs Assessment: post-procedure vital signs reviewed and stable ?Respiratory status: spontaneous breathing and respiratory function stable ?Cardiovascular status: stable ?Postop Assessment: no apparent nausea or vomiting ?Anesthetic complications: no ? ? ?No notable events documented. ? ?Last Vitals:  ?Vitals:  ? 09/22/21 1103  ?Pulse: (!) 59  ?Resp: 18  ?Temp: (!) 36.4 ?C  ?SpO2: 97%  ?  ?Last Pain:  ?Vitals:  ? 09/22/21 1103  ?TempSrc: Oral  ? ? ?  ?  ?  ?  ?  ?  ? ?Merlinda Frederick ? ? ? ? ?

## 2021-09-23 NOTE — Telephone Encounter (Signed)
Spoke wit patient in reguards to benazepril (LOTENSIN) 20 MG patient stated that when Rx is called in it should be processed right away and patient is not happy with person answering the phone and telling her it could take up to 3 days for message to be answered in the future she would like Rx same day.  ?

## 2021-09-24 ENCOUNTER — Inpatient Hospital Stay: Payer: Medicare Other | Attending: Nurse Practitioner

## 2021-09-24 ENCOUNTER — Inpatient Hospital Stay: Payer: Medicare Other | Admitting: Dietician

## 2021-09-24 ENCOUNTER — Encounter (HOSPITAL_COMMUNITY): Payer: Self-pay | Admitting: Internal Medicine

## 2021-09-24 ENCOUNTER — Inpatient Hospital Stay: Payer: Medicare Other

## 2021-09-24 ENCOUNTER — Inpatient Hospital Stay (HOSPITAL_BASED_OUTPATIENT_CLINIC_OR_DEPARTMENT_OTHER): Payer: Medicare Other | Admitting: Hematology

## 2021-09-24 ENCOUNTER — Other Ambulatory Visit: Payer: Self-pay

## 2021-09-24 VITALS — BP 135/76 | HR 61 | Temp 98.0°F | Resp 17 | Wt 133.4 lb

## 2021-09-24 DIAGNOSIS — C153 Malignant neoplasm of upper third of esophagus: Secondary | ICD-10-CM | POA: Diagnosis not present

## 2021-09-24 DIAGNOSIS — Z79899 Other long term (current) drug therapy: Secondary | ICD-10-CM | POA: Insufficient documentation

## 2021-09-24 DIAGNOSIS — Z5112 Encounter for antineoplastic immunotherapy: Secondary | ICD-10-CM | POA: Insufficient documentation

## 2021-09-24 LAB — CBC WITH DIFFERENTIAL (CANCER CENTER ONLY)
Abs Immature Granulocytes: 0.01 10*3/uL (ref 0.00–0.07)
Basophils Absolute: 0 10*3/uL (ref 0.0–0.1)
Basophils Relative: 1 %
Eosinophils Absolute: 0.1 10*3/uL (ref 0.0–0.5)
Eosinophils Relative: 2 %
HCT: 39 % (ref 36.0–46.0)
Hemoglobin: 12.7 g/dL (ref 12.0–15.0)
Immature Granulocytes: 0 %
Lymphocytes Relative: 11 %
Lymphs Abs: 0.4 10*3/uL — ABNORMAL LOW (ref 0.7–4.0)
MCH: 29.3 pg (ref 26.0–34.0)
MCHC: 32.6 g/dL (ref 30.0–36.0)
MCV: 89.9 fL (ref 80.0–100.0)
Monocytes Absolute: 0.4 10*3/uL (ref 0.1–1.0)
Monocytes Relative: 9 %
Neutro Abs: 3 10*3/uL (ref 1.7–7.7)
Neutrophils Relative %: 77 %
Platelet Count: 154 10*3/uL (ref 150–400)
RBC: 4.34 MIL/uL (ref 3.87–5.11)
RDW: 14.4 % (ref 11.5–15.5)
WBC Count: 3.9 10*3/uL — ABNORMAL LOW (ref 4.0–10.5)
nRBC: 0 % (ref 0.0–0.2)

## 2021-09-24 LAB — TSH: TSH: 2.015 u[IU]/mL (ref 0.350–4.500)

## 2021-09-24 LAB — CMP (CANCER CENTER ONLY)
ALT: 15 U/L (ref 0–44)
AST: 19 U/L (ref 15–41)
Albumin: 3.7 g/dL (ref 3.5–5.0)
Alkaline Phosphatase: 97 U/L (ref 38–126)
Anion gap: 5 (ref 5–15)
BUN: 21 mg/dL (ref 8–23)
CO2: 31 mmol/L (ref 22–32)
Calcium: 9.6 mg/dL (ref 8.9–10.3)
Chloride: 103 mmol/L (ref 98–111)
Creatinine: 0.9 mg/dL (ref 0.44–1.00)
GFR, Estimated: >60 mL/min (ref >60)
Glucose, Bld: 136 mg/dL — ABNORMAL HIGH (ref 70–99)
Potassium: 3.8 mmol/L (ref 3.5–5.1)
Sodium: 139 mmol/L (ref 135–145)
Total Bilirubin: 0.6 mg/dL (ref 0.3–1.2)
Total Protein: 6.4 g/dL — ABNORMAL LOW (ref 6.5–8.1)

## 2021-09-24 MED ORDER — SODIUM CHLORIDE 0.9 % IV SOLN
480.0000 mg | Freq: Once | INTRAVENOUS | Status: AC
  Administered 2021-09-24: 480 mg via INTRAVENOUS
  Filled 2021-09-24: qty 48

## 2021-09-24 MED ORDER — SODIUM CHLORIDE 0.9 % IV SOLN: Freq: Once | INTRAVENOUS | Status: AC

## 2021-09-24 NOTE — Patient Instructions (Signed)
[name]  ? Discharge Instructions: ?Thank you for choosing [facility] to provide your oncology and hematology care.  ? ?If you have a lab appointment with the [facility], please go directly to the [facility] and check in at the registration area. ?  ?Wear comfortable clothing and clothing appropriate for easy access to any Portacath or PICC line.  ? ?We strive to give you quality time with your provider. You may need to reschedule your appointment if you arrive late (15 or more minutes).  Arriving late affects you and other patients whose appointments are after yours.  Also, if you miss three or more appointments without notifying the office, you may be dismissed from the clinic at the provider?s discretion.    ?  ?For prescription refill requests, have your pharmacy contact our office and allow 72 hours for refills to be completed.   ? ?Today you received the following chemotherapy and/or immunotherapy agents: nivolumab    ?  ?To help prevent nausea and vomiting after your treatment, we encourage you to take your nausea medication as directed. ? ?BELOW ARE SYMPTOMS THAT SHOULD BE REPORTED IMMEDIATELY: ?*FEVER GREATER THAN 100.4 F (38 ?C) OR HIGHER ?*CHILLS OR SWEATING ?*NAUSEA AND VOMITING THAT IS NOT CONTROLLED WITH YOUR NAUSEA MEDICATION ?*UNUSUAL SHORTNESS OF BREATH ?*UNUSUAL BRUISING OR BLEEDING ?*URINARY PROBLEMS (pain or burning when urinating, or frequent urination) ?*BOWEL PROBLEMS (unusual diarrhea, constipation, pain near the anus) ?TENDERNESS IN MOUTH AND THROAT WITH OR WITHOUT PRESENCE OF ULCERS (sore throat, sores in mouth, or a toothache) ?UNUSUAL RASH, SWELLING OR PAIN  ?UNUSUAL VAGINAL DISCHARGE OR ITCHING  ? ?Items with * indicate a potential emergency and should be followed up as soon as possible or go to the Emergency Department if any problems should occur. ? ?Please show the CHEMOTHERAPY ALERT CARD or IMMUNOTHERAPY ALERT CARD at check-in  to the Emergency Department and triage nurse. ? ?Should you have questions after your visit or need to cancel or reschedule your appointment, please contact [facility]  Dept: [phone]  and follow the prompts.  Office hours are 8:00 a.m. to 4:30 p.m. Monday - Friday. Please note that voicemails left after 4:00 p.m. may not be returned until the following business day.  We are closed weekends and major holidays. You have access to a nurse at all times for urgent questions. Please call the main number to the clinic Dept: [phone] and follow the prompts. ? ? ?For any non-urgent questions, you may also contact your provider using MyChart. We now offer e-Visits for anyone 72 and older to request care online for non-urgent symptoms. For details visit [email or web address]. ?  ?Also download the MyChart app! Go to the app store, search "MyChart", open the app, select [facility], and log in with your MyChart username and password. ? ?Due to Covid, a mask is required upon entering the hospital/clinic. If you do not have a mask, one will be given to you upon arrival. For doctor visits, patients may have 1 support person aged 33 or older with them. For treatment visits, patients cannot have anyone with them due to current Covid guidelines and our immunocompromised population.  ? ?

## 2021-09-24 NOTE — Progress Notes (Signed)
Nutrition Follow-up: ? ?Patient completed concurrent chemoradiation for esophageal cancer. She is currently receiving Nivolumab q2 weeks, started 05/21/21. ? 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 Last dilation on 5/3 by Dr. Carlean Purl. Noted pending referral for SLP evaluation.  ? ?Met with patient during infusion. She reports trying more foods by mouth. Patient recalls small amounts of yogurt, cottage cheese, applesauce, mashed potatoes, soups, tuna salad. She is drinking coffee, juice, water, and Nutren as substitution for milk. Patient continues giving 5 cartons Nutren 1.5 via tube. She has requested assistance with bolus feed this afternoon. Patient denies nausea, vomiting, diarrhea, constipation.  ? ? ?Medications: reviewed  ? ?Labs: glucose 136 ? ?Anthropometrics: Weight 133 lb 7 oz today stable ? ?4/7 - 133 lb ?3/23 - 131 lb 9.6 oz ? ?NUTRITION DIAGNOSIS: Inadequate oral intake ongoing, but improving. Pt is supplementing with tube feedings ? ? ?INTERVENTION:  ?Continue to encourage trials of soft moist and minced foods - additional copy of handout with ideas provided per pt request ?Pt to decrease tube feedings by one carton to encourage increased oral intake. Will continue to monitor weight closely and adjust regimen accordingly. Pt and husband agree with this plan.  ?Give one carton Osmolite 1.5 via PEG 4x/day. Flush tube with 120 ml water QID. Provides 1420 kcal, 59.6 grams protein, 1204 ml free water ?Patient pending SLP evaluation  ?  ? ?MONITORING, EVALUATION, GOAL: weight trends, intake, tube feedings ? ? ?NEXT VISIT: Friday June 2 during infusion  ? ? ? ?

## 2021-09-24 NOTE — Progress Notes (Signed)
?[facility]   ?[phone] [phone]   ?Clinic Follow up Note  ? ?Patient Care Team: ?[name], [name], MD as PCP - General ?[name], MD as PCP - Cardiology (Cardiology) ?Cindie Crumbly (Neurosurgery) ?Irine Seal, MD as Attending Physician (Urology) ?Erline Levine, MD as Consulting Physician (Neurosurgery) ?[name], MD as Consulting Physician (Neurology) ?Viona Gilmore, [facility] as Pharmacist (Pharmacist) ?[name], MD as Consulting Physician (Gastroenterology) ?Carol Ada, MD as Consulting Physician (Gastroenterology) ?[name], MD (Inactive) as Consulting Physician (Otolaryngology) ?[name], MD as Consulting Physician (Hematology) ?Kyung Rudd, MD as Consulting Physician (Radiation Oncology) ?Kyung Rudd, MD as Consulting Physician (Radiation Oncology) ? ?Date of Service:  09/24/2021 ? ?CHIEF COMPLAINT: f/u of esophageal cancer ? ?CURRENT THERAPY:  ?Nivolumab, started 05/21/21, currently q4weeks ? ?ASSESSMENT & PLAN:  ?[name] is a 81 y.o. female with  ? 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 EGD 9/15 and CT neck 02/05/21 showed 6 cm mass in the cervical/upper esophagus with possible local adenopathy. Path confirmed squamous cell carcinoma of the esophagus, PDL1 testing 95% positive ?-She had feeding tube placed by Dr. Harlow Asa 02/22/21  ?-PET scan 02/23/21 shows hypermetabolic esophageal mass and hypermetabolic paratracheal nodes, negative for distant metastasis.  ?-due to the location in the cervical esophagus, Dr. Kipp Brood feels she is not a surgical candidate.  ?-she received concurrent chemoRT with carbo/taxol 03/08/21 - 04/14/21. She required dose reduction with last cycle due to radiation dermatitis, hoarseness, dysphagia, and odynophagia.  ?-given PDL1+, she began immunotherapy with Nivolumab on 05/21/21. Will plan to give this for a total of a year if  NED. ?-neck CT 06/18/21 showed decrease in size of mass-like enlargement of cervical esophagus and size of lymph nodes. ?-EGD on 06/21/21 by Dr. Carlean Purl showed only inflammation. Biopsy was negative for residual cancer. ?-chest CT 07/15/21 showed resolution of esophageal mass, otherwise NED. ?-labs reviewed, overall adequate to proceed with Nivo today. She is now receiving every 4 weeks. ?  ?2. Dysphagia, weight loss, cough ?-Secondary to #1 ?-She underwent G-tube placement by Dr. Harlow Asa on 02/22/21 ?-neck CT 06/18/21 showed decrease in size of esophageal mass ?-she continues esophageal dilation under Dr. Carlean Purl, most recently on 09/22/21 ?-she is still taking in nutrition by feeding tube, but she is able to swallow soft foods. Weight overall stable.  ?-She still has intermittent dry cough, possible related to mild aspiration.  I previously prescribed codeine cough syrup ?  ?3. Depression ?-secondary to social isolation and treatment side effects ?-previously discussed anti-depressants, pt declined  ?-since last visit she has increased physical activity and riding along for errands with her husband ?-appears improved, will monitor  ?  ?4. Trigeminal neuralgia, sciatica, headaches ?-On lamotrigene and oxcarbazepine  ?-pain has worsened before and during treatment ?-f/up Dr. Jaynee Eagles  ?  ?5. HTN ?-continue benazepril, furosemide ?-monitoring closely  ?  ?6. Goals of Care/Code Status ?-Code status was DNR from hospitalization 05/16/20, changed to full code during 02/26/21 hospitalization for feeding tube placement   ?  ?  ?PLAN: ?-proceed with Nivo today ?-lab, f/u, and Nivo every 4 weeks ?-plan to repeat CT scan in July  ? ? ?No problem-specific Assessment & Plan notes found for this encounter. ? ? ?SUMMARY OF ONCOLOGIC HISTORY: ?Oncology History  ?Malignant neoplasm of upper third esophagus ([facility])  ?12/21/2020 Imaging  ? Laryngoscopy Comments:    On fiberoptic laryngoscopy through the right nostril the nasopharynx was clear.  The  base of tongue vallecula and epiglottis were normal.    ?Piriform sinuses were clear bilaterally and vocal cords were clear with  ?normal vocal mobility.  No structural abnormalities noted. ?  ?01/22/2021 Imaging  ? DG esophagus IMPRESSION: ?1. Luminal narrowing in the high cervical esophagus over a 3 cm segment is highly concerning for esophageal neoplasm. Inflammatory process would be a secondary consideration. Luminal narrowing occurs approximately at the C5-C7 vertebral body level just below the glottis. Patient experienced several episodes of choking related to this luminal narrowing. Recommend expedient upper GI endoscopy for evaluation. ?2. Distal thoracic esophagus and GE junction appear normal. ?  ?02/04/2021 Procedure  ? EGD by Dr. Carlean Purl impression- One mass-like severe stenosis was found in the upper third of the esophagus. The stenosis was not traversed. ?  ?02/05/2021 Imaging  ? CT soft tissue neck w contrast IMPRESSION: ?Mass-like soft tissue thickening and mucosal hyperenhancement of the cervical esophagus and hypopharynx, spanning the C4-T1 levels, likely reflecting an esophageal malignancy. This measures up to 2.4 x 3.4 cm in transaxial dimensions, and 6.3 cm in craniocaudal ?dimension. Associated severe effacement of the esophageal lumen. ?  ?Centrally necrotic right paratracheal lymph node beneath the level of the mass, measuring 1.3 x 0.9 cm, and likely reflecting a site of nodal metastatic disease. ?  ?Additional lymph nodes along the posterior and inferior aspect of the right thyroid lobe (along the right aspect of the esophageal mass), which measure subcentimeter but are asymmetrically prominent and highly suspicious for additional sites of nodal metastatic ?disease. ?  ?02/12/2021 Pathology Results  ? FINAL MICROSCOPIC DIAGNOSIS:  ?A. ESOPHAGUS, UPPER CERVICAL #1, BIOPSY:  ?- Squamous cell carcinoma.  ?B. ESOPHAGUS, UPPER CERVICAL #2, BIOPSY:  ?- Squamous cell carcinoma.  ?  ?02/12/2021 Cancer  Staging  ? Staging form: Esophagus - Squamous Cell Carcinoma, AJCC 8th Edition ?- Clinical stage from 02/12/2021: Stage Unknown (cTX, cN2, cM0) - Signed by [name], MD on 03/08/2021 ?Stage prefix: Initial diagnosis ? ?  ?02/18/2021 Initial Diagnosis  ? Malignant neoplasm of upper third esophagus (HCC) ?  ?02/23/2021 PET scan  ? IMPRESSION: ?Markedly hypermetabolic focal masslike thickening of the cervical esophagus, compatible with primary esophageal malignancy. ?  ?Markedly hypermetabolic right upper paratracheal lymph node, ?compatible with metastatic disease. ?  ?Small right upper paratracheal and right level IIA lymph nodes with mild hypermetabolic activity, concerning for additional sites of nodal metastatic disease. ?  ?Aortic Atherosclerosis (ICD10-I70.0). ?  ?03/09/2021 - 04/13/2021 Chemotherapy  ? Patient is on Treatment Plan : ESOPHAGUS Carboplatin/PACLitaxel weekly x 6 weeks with XRT    ? ?  ?  ?05/21/2021 -  Chemotherapy  ? Patient is on Treatment Plan : GASTROESOPHAGEAL Nivolumab q14d x 8 cycles / Nivolumab q28d  ? ?  ?  ? ? ? ?INTERVAL HISTORY:  ?[name] is here for a follow up of esophageal cancer. She was last seen by me on 08/27/21. She presents to the clinic accompanied by her husband. ?She reports she continues to eat soft foods. ?  ?All other systems were reviewed with the patient and are negative. ? ?MEDICAL HISTORY:  ?Past Medical History:  ?Diagnosis Date  ? Abdominal pain 05/29/2013  ? s/p rx of cephalo resistant e coli   but last rx NG  now residular ?  bladder sx repeat cx sx rx to ty and uro consult   ? 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 Qualifier: Diagnosis of  By: [name],  [name]   ? Agent resistant to multiple antibiotics 05/29/2013  ? e coli   bu NG on fu.    ? Anemia   ? Anxiety   ? ARF (acute renal failure) ([place]) 03/12/2015  ? Closed head injury 02/01/2011  ? from syncope and had scalp laceration  neg ct .    ? Closed head injury 5-6 yrs ago   ? Colitis 11/27/2017  ? Complication of anesthesia   ? migraine several hours after general anesthesia  ? Depression   ? esophageal ca 01/2021  ? Fatty liver   ? Gall stones 2016  ? see ct scan neg

## 2021-09-25 LAB — T4: T4, Total: 7.1 ug/dL (ref 4.5–12.0)

## 2021-09-28 ENCOUNTER — Telehealth: Payer: Self-pay | Admitting: Adult Health

## 2021-09-28 ENCOUNTER — Encounter: Payer: Self-pay | Admitting: Neurology

## 2021-09-28 ENCOUNTER — Other Ambulatory Visit: Payer: Self-pay

## 2021-09-28 ENCOUNTER — Telehealth: Payer: Self-pay

## 2021-09-28 DIAGNOSIS — R1310 Dysphagia, unspecified: Secondary | ICD-10-CM

## 2021-09-28 DIAGNOSIS — K222 Esophageal obstruction: Secondary | ICD-10-CM

## 2021-09-28 NOTE — Telephone Encounter (Signed)
-----   Message from [name], MD sent at 09/27/2021  5:10 PM EDT ----- ?Regarding: speech path referral ?Please refer to speech path for modified barium swallow ? ?Dysphagia with radiation stricture at proximal esophagus and upper esophageal sphincter (see 5/3 EGD for more info) ? ?

## 2021-09-28 NOTE — Telephone Encounter (Signed)
Referral placed in Epic for speech therapy: for modified barium swallow ?Dysphagia with radiation stricture at proximal esophagus and upper esophageal sphincter  ?  ?  ?

## 2021-09-28 NOTE — Telephone Encounter (Signed)
Patient sent a detailed mychart message. Will forward to Dr Jaynee Eagles.  ?

## 2021-09-28 NOTE — Telephone Encounter (Signed)
Pt would like a call from the nurse to discuss difficulty with Dr. Zada Finders and suggestion on another physician. ?

## 2021-09-29 ENCOUNTER — Ambulatory Visit: Payer: Medicare Other

## 2021-10-05 ENCOUNTER — Other Ambulatory Visit: Payer: Self-pay

## 2021-10-05 ENCOUNTER — Telehealth: Payer: Self-pay

## 2021-10-05 DIAGNOSIS — R1310 Dysphagia, unspecified: Secondary | ICD-10-CM

## 2021-10-05 DIAGNOSIS — K222 Esophageal obstruction: Secondary | ICD-10-CM

## 2021-10-05 NOTE — Telephone Encounter (Signed)
Modified Barium Swallow order placed in Epic: ?Spoke to [place] with Speech and verified that the order was correct: Suanne Marker stated that they will reach out to the pt and schedule her for the test. ? ?

## 2021-10-05 NOTE — Telephone Encounter (Signed)
-----   Message from Gillermina Hu, RN sent at 09/28/2021 12:32 PM EDT ----- ?Regarding: Follow Up Speech referral ?----- Message from [name], MD sent at 09/27/2021  5:10 PM EDT ----- ?Regarding: speech path referral ?Please refer to speech path for modified barium swallow ?? ?Dysphagia with radiation stricture at proximal esophagus and upper esophageal sphincter (see 5/3 EGD for more info) ?? ?Referral sent 09/28/2021  ?? ? ?

## 2021-10-05 NOTE — Telephone Encounter (Signed)
Left message for speech therapy to call back to coordinate Modified Barium Swallow ?

## 2021-10-07 ENCOUNTER — Other Ambulatory Visit (HOSPITAL_COMMUNITY): Payer: Self-pay | Admitting: *Deleted

## 2021-10-07 ENCOUNTER — Other Ambulatory Visit: Payer: Self-pay | Admitting: Hematology

## 2021-10-07 ENCOUNTER — Telehealth (HOSPITAL_COMMUNITY): Payer: Self-pay | Admitting: *Deleted

## 2021-10-07 DIAGNOSIS — C159 Malignant neoplasm of esophagus, unspecified: Secondary | ICD-10-CM | POA: Diagnosis not present

## 2021-10-07 DIAGNOSIS — Z9889 Other specified postprocedural states: Secondary | ICD-10-CM | POA: Diagnosis not present

## 2021-10-07 DIAGNOSIS — R1310 Dysphagia, unspecified: Secondary | ICD-10-CM

## 2021-10-07 DIAGNOSIS — G500 Trigeminal neuralgia: Secondary | ICD-10-CM | POA: Diagnosis not present

## 2021-10-07 DIAGNOSIS — Z923 Personal history of irradiation: Secondary | ICD-10-CM | POA: Diagnosis not present

## 2021-10-07 NOTE — Telephone Encounter (Signed)
Attempted to contact patient to schedule OP MBS. Left VM. RKEEL

## 2021-10-13 ENCOUNTER — Other Ambulatory Visit: Payer: Self-pay | Admitting: Internal Medicine

## 2021-10-13 ENCOUNTER — Other Ambulatory Visit: Payer: Self-pay

## 2021-10-13 ENCOUNTER — Telehealth: Payer: Self-pay

## 2021-10-13 DIAGNOSIS — R1310 Dysphagia, unspecified: Secondary | ICD-10-CM

## 2021-10-13 DIAGNOSIS — R633 Feeding difficulties, unspecified: Secondary | ICD-10-CM

## 2021-10-13 NOTE — Telephone Encounter (Signed)
Referral to Speech therapy Updated:

## 2021-10-14 ENCOUNTER — Ambulatory Visit (HOSPITAL_COMMUNITY)
Admission: RE | Admit: 2021-10-14 | Discharge: 2021-10-14 | Disposition: A | Discharge status: Home / Self Care | Payer: Medicare Other | Source: Ambulatory Visit | Attending: Internal Medicine | Admitting: Internal Medicine

## 2021-10-14 DIAGNOSIS — R1311 Dysphagia, oral phase: Secondary | ICD-10-CM | POA: Insufficient documentation

## 2021-10-14 DIAGNOSIS — R1310 Dysphagia, unspecified: Secondary | ICD-10-CM

## 2021-10-14 DIAGNOSIS — K222 Esophageal obstruction: Secondary | ICD-10-CM | POA: Diagnosis not present

## 2021-10-17 NOTE — Progress Notes (Unsigned)
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  The base of tongue vallecula and epiglottis were normal.    Piriform sinuses were clear bilaterally and vocal cords were clear with  normal vocal mobility.  No structural abnormalities noted.   01/22/2021 Imaging   DG esophagus IMPRESSION: 1. Luminal narrowing in the high cervical esophagus over a 3 cm segment is highly concerning for esophageal neoplasm. Inflammatory process would be a secondary consideration. Luminal narrowing occurs approximately at the C5-C7 vertebral body level just below the glottis. Patient experienced several episodes of choking related to this luminal narrowing. Recommend expedient upper GI endoscopy for evaluation. 2. Distal thoracic esophagus and GE junction appear normal.   02/04/2021 Procedure   EGD by Dr. Carlean Purl impression- One mass-like severe stenosis was found in the upper third of the esophagus. The stenosis was not traversed.   02/05/2021 Imaging   CT soft tissue neck w contrast IMPRESSION: Mass-like soft tissue thickening and mucosal hyperenhancement of the cervical esophagus and hypopharynx, spanning the C4-T1 levels, likely reflecting an esophageal malignancy. This measures up to 2.4 x 3.4 cm in transaxial dimensions, and 6.3 cm in craniocaudal dimension. Associated severe effacement of the esophageal lumen.   Centrally necrotic right paratracheal lymph node beneath the level of the mass, measuring 1.3 x 0.9 cm, and likely reflecting a site of nodal metastatic disease.   Additional lymph nodes along the posterior and inferior aspect of the right thyroid lobe (along the right aspect of the esophageal mass),  which measure subcentimeter but are asymmetrically prominent and highly suspicious for additional sites of nodal metastatic disease.   02/12/2021 Pathology Results   FINAL MICROSCOPIC DIAGNOSIS:  A. ESOPHAGUS, UPPER CERVICAL #1, BIOPSY:  - Squamous cell carcinoma.  B. ESOPHAGUS, UPPER CERVICAL #2, BIOPSY:  - Squamous cell carcinoma.    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   Markedly hypermetabolic right upper paratracheal lymph node, compatible with metastatic disease.   Small right upper paratracheal and right level IIA lymph nodes with mild hypermetabolic activity, concerning for additional sites of nodal metastatic disease.   Aortic Atherosclerosis (ICD10-I70.0).   03/09/2021 - 04/13/2021 Chemotherapy   Patient is on Treatment Plan : ESOPHAGUS Carboplatin/PACLitaxel weekly x 6 weeks with XRT        05/21/2021 -  Chemotherapy   Patient is on Treatment Plan : GASTROESOPHAGEAL Nivolumab q14d x 8 cycles / Nivolumab q28d         CURRENT THERAPY: Nivolumab, started 05/21/21, currently q4weeks  INTERVAL HISTORY: [name] 81 y.o. female returns to the clinic today for a follow-up visit accompanied by her husband.  The patient is feeling fairly well today without any concerning complaints except for a headache today and flair up of her sciatica. She reports she has a history of sciatica and has had surgery  on her back. She took 1069m of tylenol at 8 AM today.  She is currently undergoing immunotherapy with nivolumab and she is tolerating  it well without any concerning adverse side effects.  Since last being seen, she states things have been "uneventful". Earlier in May 2023 the patient underwent repeat EGD which did not show evidence of disease progression and showed radiation with associated stricture at the proximal esophagus just and upper esophagus sphincter.  She is followed closely by Dr. GCarlean Purlfrom gastroenterology.  She has a feeding tube but is able to swallow soft swallow solid food. She denies any fever, chills, or night sweats.  She is followed closely by member the nutritionist team and is expected to see them while in the infusion room today.  Her weight and appetite is "good" but she cannot eat large portions. She lost about 2 lbs though. She states her and her husband are going to try to mix ice cream with her supplemental drinks in the day.  She denies any chest pain, shortness of breath, or hemoptysis. She reportedly had a cough but reports it has not been nearly as "severe" as it was. She is pleased that her cough has improved. Denies any nausea, vomiting, diarrhea, or constipation.  Denies any abdominal pain.  Denies any rashes or skin changes.  She is here today for evaluation and repeat blood work before undergoing her next cycle of treatment.   MEDICAL HISTORY: Past Medical History:  Diagnosis Date   Abdominal pain 05/29/2013   s/p rx of cephalo resistant e coli   but last rx NG  now residular ?  bladder sx repeat cx sx rx to ty and uro consult    ADJ DISORDER WITH MIXED ANXIETY & DEPRESSED MOOD 03/03/2010   Qualifier: Diagnosis of  By: PRegis BillMD, WStandley Brooking   Agent resistant to multiple antibiotics 05/29/2013   e coli   bu NG on fu.     Anemia    Anxiety    ARF (acute renal failure) (HCC) 03/12/2015   Closed head injury 02/01/2011   from syncope and had scalp laceration  neg ct .     Closed head injury 5-6 yrs ago   Colitis 003/83/3383  Complication of anesthesia    migraine several hours after general  anesthesia   Depression    esophageal ca 01/2021   Fatty liver    Gall stones 2016   see ct scan neg HIDA    GERD (gastroesophageal reflux disease)    Hearing aid worn    HOH (hard of hearing)    both ears   Hyperlipidemia    Hypertension    echo nl lv function  mild dilitation 2009   Kidney infection    few yrs ago in hospital   Medication side effect 09/02/2010   Poss muscle se of 10 crestor    Migraine    hypnic HA eval by Dr. 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  MEDICATIONS:  Current Outpatient Medications  Medication Sig Dispense Refill   benazepril (LOTENSIN) 20  MG tablet TAKE 1 TABLET(20 MG) BY MOUTH TWICE DAILY (Patient taking differently: Place 20 mg into feeding tube every morning.) 180 tablet 0   carvedilol (COREG) 25 MG tablet TAKE 1 TABLET(25 MG) BY MOUTH TWICE DAILY WITH A MEAL 60 tablet 3   conjugated estrogens (PREMARIN) vaginal cream Place 1 applicator vaginally once a week.     fluticasone (FLONASE) 50 MCG/ACT nasal spray Place 2 sprays into both nostrils as needed for allergies or rhinitis. (Patient not taking: Reported on 09/22/2021) 16 g 5   furosemide (LASIX) 20 MG tablet TAKE 1 TABLET(20 MG) BY MOUTH DAILY (Patient taking differently: Place 20 mg into feeding tube every morning.) 90 tablet 0   guaiFENesin-codeine 100-10 MG/5ML syrup TAKE 5 ML BY MOUTH EVERY 6 HOURS AS NEEDED FOR COUGH (Patient taking differently: Place 5 mLs into feeding tube 2 (two) times daily as needed for cough.) 240 mL 0   lamoTRIgine (LAMICTAL) 100 MG tablet Place 200 mg into feeding tube 2 (two) times daily. For pain      loperamide (IMODIUM) 2 MG capsule 2 mg daily as needed for diarrhea or loose stools. Per tube     Multiple Minerals-Vitamins (CAL-MAG-ZINC-D PO) Place 1 tablet into feeding tube 2 (two) times daily.     Multiple Vitamin (MULTIVITAMIN WITH MINERALS) TABS tablet Place 1 tablet into feeding tube in the morning.     Nutritional Supplements (NUTREN 1.5) LIQD Place 237 mLs into feeding tube 5 (five) times daily.     omeprazole (PRILOSEC) 20 MG capsule 20 mg at bedtime. Per tube     ondansetron (ZOFRAN-ODT) 4 MG disintegrating tablet DISSOLVE 1 TO 2 TABLETS(4 TO 8 MG) ON THE TONGUE EVERY 8 HOURS AS NEEDED FOR NAUSEA OR VOMITING 30 tablet 1   Oxcarbazepine (TRILEPTAL) 300 MG tablet Place 150 mg into feeding tube 2 (two) times daily. For pain     Polyethyl Glycol-Propyl Glycol (SYSTANE) 0.4-0.3 % GEL ophthalmic gel Place 1 application into both eyes 2 (two) times daily as needed (dry eyes).     prochlorperazine (COMPAZINE) 10 MG tablet TAKE 1 TABLET(10 MG) BY MOUTH EVERY 6 HOURS AS NEEDED FOR NAUSEA OR VOMITING (Patient taking differently: Take 10 mg by mouth every 6 (six) hours as needed for nausea or vomiting.) 30 tablet 1   sucralfate (CARAFATE) 1 GM/10ML suspension Take 10 mLs (1 g total) by mouth 4 (four) times daily -  with meals and at bedtime. 420 mL 5   No current facility-administered medications for this visit.    SURGICAL HISTORY:  Past Surgical History:  Procedure Laterality Date   ABDOMINAL HYSTERECTOMY  2002   tubal   BACK SURGERY     2 times, for sciatic nerve pain   BALLOON DILATION  06/21/2021   Procedure: DILATION BALLOON used for esophageal stricture;  Surgeon: Irving Copas., MD;  Location: [facility];  Service: Gastroenterology;;   CARDIAC CATHETERIZATION  2000   chest pains neg   CHOLECYSTECTOMY N/A 02/21/2017   Procedure: LAPAROSCOPIC CHOLECYSTECTOMY WITH INTRAOPERATIVE CHOLANGIOGRAM;  Surgeon: Armandina Gemma, MD;  Location: [facility];  Service: General;  Laterality:  N/A;   COLONOSCOPY     multiple   CRANIOTOMY  12/09/2011   nerve decompression right trigeminal    DIRECT LARYNGOSCOPY N/A 02/12/2021   Procedure: DIRECT LARYNGOSCOPY AND BIOPSY POSSIBLE FROZEN;  Surgeon: [name], MD;  Location: [facility];  Service: ENT;  Laterality: N/A;   DOPPLER ECHOCARDIOGRAPHY  2009   nl lv function mild  lv dilitation   ESOPHAGOGASTRODUODENOSCOPY (EGD) WITH PROPOFOL N/A 06/21/2021   Procedure: ESOPHAGOGASTRODUODENOSCOPY (EGD) WITH PROPOFOL;  Surgeon: Rush Landmark Telford Nab., MD;  [facility]: [facility];  Service: Gastroenterology;  Laterality: N/A;  Request Fluoroscopy; Plan in for dilation   ESOPHAGOGASTRODUODENOSCOPY (EGD) WITH PROPOFOL N/A 07/19/2021   Procedure: ESOPHAGOGASTRODUODENOSCOPY (EGD) WITH PROPOFOL;  Surgeon: Rush Landmark Telford Nab., MD;  [facility];  Service: Endoscopy;  Laterality: N/A;  Dilation fluoro   ESOPHAGOGASTRODUODENOSCOPY (EGD) WITH PROPOFOL N/A 08/05/2021   Procedure: ESOPHAGOGASTRODUODENOSCOPY (EGD) WITH PROPOFOL - fluoro;  Surgeon: Rush Landmark Telford Nab., MD;  Location: [facility];  Service: Gastroenterology;  Laterality: N/A;   ESOPHAGOGASTRODUODENOSCOPY (EGD) WITH PROPOFOL N/A 08/23/2021   Procedure: ESOPHAGOGASTRODUODENOSCOPY (EGD) WITH PROPOFOL;  Surgeon: [name], MD;  [facility];  Service: Gastroenterology;  Laterality: N/A;  Using Fluoroscopy and esophageal dilation   ESOPHAGOGASTRODUODENOSCOPY (EGD) WITH PROPOFOL N/A 09/22/2021   Procedure: ESOPHAGOGASTRODUODENOSCOPY (EGD) WITH PROPOFOL;  Surgeon: [name], MD;  [facility];  Service: Gastroenterology;  Laterality: N/A;  EGD with savary and flour   EYE SURGERY Bilateral    ioc for catatracts   GASTROSTOMY N/A 02/26/2021   Procedure: OPEN GASTROSTOMY TUBE PLACEMENT;  Surgeon: Armandina Gemma, MD;  Location: [facility];  Service: General;  Laterality: N/A;   IR GASTROSTOMY TUBE MOD SED  02/22/2021   IR REPLACE G-TUBE  SIMPLE WO FLUORO  09/07/2021   laparoscopic gallbladder surgery  02/16/2017   Fax from [facility] Bilateral 2002   rt shoulder surgery     SAVORY DILATION N/A 06/21/2021   Procedure: SAVORY DILATION;  Surgeon: Irving Copas., MD;  Location: [facility];  Service: Gastroenterology;  Laterality: N/A;   SAVORY DILATION N/A 07/19/2021   Procedure: SAVORY DILATION;  Surgeon: Rush Landmark Telford Nab., MD;  [facility];  Service: Endoscopy;  Laterality: N/A;   SAVORY DILATION N/A 08/05/2021   Procedure: SAVORY DILATION;  Surgeon: Rush Landmark Telford Nab., MD;  Location: [facility];  Service: Gastroenterology;  Laterality: N/A;   SAVORY DILATION N/A 08/23/2021   Procedure: SAVORY DILATION;  Surgeon: [name], MD;  [facility];  Service: Gastroenterology;  Laterality: N/A;   SAVORY DILATION N/A 09/22/2021   Procedure: SAVORY DILATION;  Surgeon: [name], MD;  [facility];  Service: Gastroenterology;  Laterality: N/A;    REVIEW OF SYSTEMS:   Review of Systems  Constitutional: Negative for appetite change, chills, fatigue, fever and unexpected weight change.  HENT: Negative for mouth sores, nosebleeds, sore throat and trouble swallowing.   Eyes: Negative for eye problems and icterus.  Respiratory: Positive for improved cough. Negative for hemoptysis, shortness of breath and wheezing.   Cardiovascular: Negative for chest pain and leg swelling.  Gastrointestinal: Negative for abdominal pain, constipation, diarrhea, nausea and vomiting.  Genitourinary: Negative for bladder incontinence, difficulty urinating, dysuria, frequency and hematuria.   Musculoskeletal: Positive for sciatica today. Negative for gait problem, neck pain and neck stiffness.  Skin: Negative for itching and rash.  Neurological: Positive for headache today. Negative for dizziness, extremity weakness, gait problem, light-headedness and seizures.   Hematological: Negative for adenopathy. Does not bruise/bleed easily.  Psychiatric/Behavioral: Negative for confusion, depression and sleep disturbance. The patient is not nervous/anxious.     PHYSICAL EXAMINATION:  Blood pressure (!) 118/58, pulse (!) 59, temperature 97.9 F (36.6 C), temperature source Temporal, resp. rate 17, height _0  (1.676 m), weight 131 lb 14.4 oz (59.8 kg), SpO2 93 %.  ECOG PERFORMANCE STATUS: 2  Physical Exam  Constitutional: Oriented to person, place, and time and well-developed, well-nourished, and in no distress.  HENT:  Head: Normocephalic and atraumatic.  Mouth/Throat: Oropharynx is clear and moist. No oropharyngeal exudate.  Eyes: Conjunctivae are normal. Right eye exhibits no discharge. Left eye exhibits no discharge. No scleral icterus.  Neck: Normal range of motion. Neck supple.  Cardiovascular: Normal rate, regular rhythm, normal heart sounds and intact distal pulses.   Pulmonary/Chest: Effort normal and breath sounds normal. No respiratory distress. No wheezes. No rales.  Abdominal: Soft. Bowel sounds are normal. Exhibits no distension and no mass. There is no tenderness.  Musculoskeletal: Normal range of motion. Exhibits no edema.  Lymphadenopathy:    No cervical adenopathy.  Neurological: Alert and oriented to person, place, and time. Exhibits normal muscle tone. Gait normal. Coordination normal.  Skin: Skin is warm and dry. No rash noted. Not diaphoretic. No erythema. No pallor.  Psychiatric: Mood, memory and judgment normal.  Vitals reviewed.  LABORATORY DATA: Lab Results  Component Value Date   WBC 5.1 10/22/2021   HGB 13.1 10/22/2021   HCT 39.5 10/22/2021   MCV 88.2 10/22/2021   PLT 160 10/22/2021      Chemistry      Component Value Date/Time   NA 137 10/22/2021 0956   NA 136 03/24/2021 1508   K 4.0 10/22/2021 0956   CL 103 10/22/2021 0956   CO2 31 10/22/2021 0956   BUN 20 10/22/2021 0956   BUN 26 03/24/2021 1508    CREATININE 0.85 10/22/2021 0956      Component Value Date/Time   CALCIUM 9.7 10/22/2021 0956   ALKPHOS 94 10/22/2021 0956   AST 17 10/22/2021 0956   ALT 13 10/22/2021 0956   BILITOT 0.8 10/22/2021 0956       RADIOGRAPHIC STUDIES:  DG SWALLOW FUNC OP MEDICARE SPEECH PATH  Result Date: 10/14/2021 Table formatting from the original result was not included. Images from the original result were not included. Objective Swallowing Evaluation: Type of Study: MBS-Modified Barium Swallow Study  Patient Details Name: [name] MRN: [id number] Date of Birth: March 11, 1941 Today's Date: 10/14/2021 Time: SLP Start Time (ACUTE ONLY): 1305 -SLP Stop Time (ACUTE ONLY): 1330 SLP Time Calculation (min) (ACUTE ONLY): 25 min Past Medical History: Past Medical History: Diagnosis Date  Abdominal pain 05/29/2013  s/p rx of cephalo resistant e coli   but last rx NG  now residular ?  bladder sx repeat cx sx rx to ty and uro consult   ADJ DISORDER WITH MIXED ANXIETY & DEPRESSED MOOD 03/03/2010  Qualifier: Diagnosis of  By: [name], [name]   Agent resistant to multiple antibiotics 05/29/2013  e coli   bu NG on fu.    Anemia   Anxiety   ARF (acute renal failure) ([facility]) 03/12/2015  Closed head injury 02/01/2011  from syncope and had scalp laceration  neg ct .    Closed head injury 5-6 yrs ago  Colitis 58/83/2549  Complication of anesthesia   migraine several hours after general anesthesia  Depression   esophageal ca 01/2021  Fatty liver   Gall stones 2016  see ct scan neg HIDA   GERD (gastroesophageal reflux disease)   Hearing aid worn   HOH (hard of hearing)   both ears  Hyperlipidemia   Hypertension   echo nl lv function  mild dilitation 2009  Kidney infection   few yrs ago in hospital  Medication side effect 09/02/2010  Poss muscle se of 10 crestor  Migraine   hypnic HA eval by Dr. Earley Favor in the past  Polycythemia   Positive PPD   when young   Pyelonephritis 03/12/2015  Sensation of pain in anesthetized distribution  of trigeminal nerve   Syncope 02/01/2011  In shower on vacation  sustained head laceration  8 sutures Had ed visit neg head ct labs and x ray   Trigeminal neuralgia pain  Past Surgical History: Past Surgical History: Procedure Laterality Date  ABDOMINAL HYSTERECTOMY  2002  tubal  BACK SURGERY    2 times, for sciatic nerve pain  BALLOON DILATION  06/21/2021  Procedure: DILATION BALLOON used for esophageal stricture;  Surgeon: Irving Copas., MD;  [facility];  Service: Gastroenterology;;  CARDIAC CATHETERIZATION  2000  chest pains neg  CHOLECYSTECTOMY N/A 02/21/2017  Procedure: LAPAROSCOPIC CHOLECYSTECTOMY WITH INTRAOPERATIVE CHOLANGIOGRAM;  Surgeon: Armandina Gemma, MD;  Location[facility];  Service: General;  Laterality: N/A;  COLONOSCOPY    multiple  CRANIOTOMY  12/09/2011  nerve decompression right trigeminal   DIRECT LARYNGOSCOPY N/A 02/12/2021  Procedure: DIRECT LARYNGOSCOPY AND BIOPSY POSSIBLE FROZEN;  Surgeon: [name], MD;  Location: [facility];  Service: ENT;  Laterality: N/A;  DOPPLER ECHOCARDIOGRAPHY  2009  nl lv function mild lv dilitation  ESOPHAGOGASTRODUODENOSCOPY (EGD) WITH PROPOFOL N/A 06/21/2021  Procedure: ESOPHAGOGASTRODUODENOSCOPY (EGD) WITH PROPOFOL;  Surgeon: Irving Copas., MD;  Location: [facility];  Service: Gastroenterology;  Laterality: N/A;  Request Fluoroscopy; Plan in for dilation  ESOPHAGOGASTRODUODENOSCOPY (EGD) WITH PROPOFOL N/A 07/19/2021  Procedure: ESOPHAGOGASTRODUODENOSCOPY (EGD) WITH PROPOFOL;  Surgeon: Rush Landmark Telford Nab., MD;  [facility];  Service: Endoscopy;  Laterality: N/A;  Dilation fluoro  ESOPHAGOGASTRODUODENOSCOPY (EGD) WITH PROPOFOL N/A 08/05/2021  Procedure: ESOPHAGOGASTRODUODENOSCOPY (EGD) WITH PROPOFOL - fluoro;  Surgeon: Rush Landmark Telford Nab., MD;  Location: [facility];  Service: Gastroenterology;  Laterality: N/A;  ESOPHAGOGASTRODUODENOSCOPY (EGD) WITH PROPOFOL N/A 08/23/2021  Procedure:  ESOPHAGOGASTRODUODENOSCOPY (EGD) WITH PROPOFOL;  Surgeon: [name], MD;  [facility];  Service: Gastroenterology;  Laterality: N/A;  Using Fluoroscopy and esophageal dilation  ESOPHAGOGASTRODUODENOSCOPY (EGD) WITH PROPOFOL N/A 09/22/2021  Procedure: ESOPHAGOGASTRODUODENOSCOPY (EGD) WITH PROPOFOL;  Surgeon: [name], MD;  [facility];  Service: Gastroenterology;  Laterality: N/A;  EGD with savary and flour  EYE SURGERY Bilateral   ioc for catatracts  GASTROSTOMY N/A 02/26/2021  Procedure: OPEN GASTROSTOMY TUBE PLACEMENT;  Surgeon: Armandina Gemma, MD;  [facility];  Service: General;  Laterality: N/A;  IR GASTROSTOMY TUBE MOD SED  02/22/2021  IR REPLACE G-TUBE SIMPLE WO FLUORO  09/07/2021  laparoscopic gallbladder surgery  02/16/2017  Fax from [facility] Bilateral 2002  rt shoulder surgery    SAVORY DILATION N/A 06/21/2021  Procedure: SAVORY DILATION;  Surgeon: Irving Copas., MD;  Location: [facility];  Service: Gastroenterology;  Laterality: N/A;  SAVORY DILATION N/A 07/19/2021  Procedure: SAVORY DILATION;  Surgeon: Rush Landmark Telford Nab., MD;  [facility];  Service: Endoscopy;  Laterality: N/A;  SAVORY DILATION N/A 08/05/2021  Procedure: SAVORY DILATION;  Surgeon: Rush Landmark Telford Nab., MD;  Location: [facility];  Service: Gastroenterology;  Laterality: N/A;  SAVORY DILATION N/A 08/23/2021  Procedure: SAVORY DILATION;  Surgeon: [name], MD;  [facility];  Service: Gastroenterology;  Laterality: N/A;  SAVORY DILATION N/A 09/22/2021  Procedure: SAVORY DILATION;  Surgeon: [name], MD;  [facility];  Service: Gastroenterology;  Laterality: N/A; HPI: 81 y.o. is a right hand dominant female, with a history of proximal esophageal  cancer, right (V3) sided Trigeminal neuralgia - referral for neurosurgery completed in 10/2020. Pt underwent radiation and chemo for her proximal esophageal cancer - is s/p PEG  placement - and recent proximal esophageal dilatation to 12 mm.  Medication list includes but not limited to Zofran, Omeprazole, and Carafate. Pt reports goal of evaluation is to determine if pt can consume more solid foods.  She denies requiring heimlich manuever, nor recurrent pneumonias.  She uses PEG for both nutrition and medication administration.  Spouse reports pt consumed pasta =- masticating well.  Subjective: pt sitting upright in chair, spouse [name]* present  Recommendations for follow up therapy are one component of a multi-disciplinary discharge planning process, led by the attending physician.  Recommendations may be updated based on patient status, additional functional criteria and insurance authorization. Assessment / Plan / Recommendation   10/14/2021   1:00 PM Clinical Impressions Clinical Impression Pt with functional oropharyngeal swallow ability.  No aspiration or penetration of any consistency tested present. Piecemealing observed across all liquid consistencies which is functional for pt.  She did use thin barium to aid oral transit of cracker to ease transiting. Question if combination of xerostomia as well as trigeminal neuralgia contributes to need to use liquids to aid oral transit of foods.  No retention observed in pharynx - despite appearance of potential mild edema. Advised pt to consume solids - masticating well and start all intake with liquids.  Thin liquid swallows appear to aid passage of thicker consistencies near C6-C7; thus recommend pt follow solid with liquids.   No backflow observed during MBS - MBS not designated to evaluate esophageal swallow however.  Pt did not report symptoms of dysphagia during testing.  Did not test barium tablet - as pt dilated to 12 mm and barium tablet is 13 mm.  Of note, pt coughed with thin water consumption before and after MBS - but did not aspirate or cough during consumption of thin and ultra thin barium.  Using teach back, educated pt and  her spouse to recommendations.  SLP Visit Diagnosis Dysphagia, oral phase (R13.11) Impact on safety and function Mild aspiration risk      View : No data to display.         View : No data to display.      10/14/2021   1:00 PM Diet Recommendations SLP Diet Recommendations Regular solids;Thin liquid Liquid Administration via Cup;Straw Medication Administration -- Compensations Slow rate;Small sips/bites;Follow solids with liquid Postural Changes Seated upright at 90 degrees      View : No data to display.       View : No data to display.        10/14/2021   1:00 PM Oral Phase Oral Phase Impaired Oral - Nectar Cup WFL Oral - Thin Teaspoon WFL Oral - Thin Cup WFL;Piecemeal swallowing Oral - Thin Straw WFL;Piecemeal swallowing Oral - Puree WFL Oral - Mech Soft Reduced posterior propulsion Oral Phase - Comment pt used liquids to aid oral transiting of dry cracker bolus effectively    10/14/2021   1:00 PM Pharyngeal Phase Pharyngeal Phase [facility]    10/14/2021   1:00 PM Cervical Esophageal Phase  Cervical Esophageal Phase -- Kathleen Lime, MS [facility] SLP Acute Rehab Services Office [phone] Pager [phone] Macario Golds 10/14/2021, 2:24 PM                   CLINICAL DATA:  Dysphagia. Cough/GE reflux disease/other secondary diagnosis EXAM: MODIFIED BARIUM SWALLOW TECHNIQUE: Different consistencies  of barium were administered orally to the patient by the Speech Pathologist. Imaging of the pharynx was performed in the lateral projection. The radiologist was present in the fluoroscopy room for this study, providing personal supervision. FLUOROSCOPY: Radiation Exposure Index (as provided by the fluoroscopic device): 3.1 mGy Kerma COMPARISON:  None Available. FINDINGS: Vestibular  Penetration:  None seen. Aspiration:  None seen. Other:  None. IMPRESSION: See speech pathology report for full detail. Please refer to the Speech Pathologists report for complete details and recommendations. Electronically Signed   By: Suzy Bouchard  M.D.   On: 10/14/2021 13:58   DG ESOPHAGUS DILATION  Result Date: 09/22/2021 ESOPHAGEAL DILATATION: Fluoroscopy was provided for use by the requesting physician.  No images were obtained for radiographic interpretation.    ASSESSMENT/PLAN:  Nivolumab, started 05/21/21, currently q4weeks   ASSESSMENT & PLAN:  [name] is a 81 y.o. female with    1. Squamous cell carcinoma of cervical esophagus, cTxN2M0 -presented with dysphagia and weight loss. EGD 9/15 and CT neck 02/05/21 showed 6 cm mass in the cervical/upper esophagus with possible local adenopathy. Path confirmed squamous cell carcinoma of the esophagus, PDL1 testing 95% positive -She had feeding tube placed by Dr. Harlow Asa 02/22/21  -PET scan 02/23/21 shows hypermetabolic esophageal mass and hypermetabolic paratracheal nodes, negative for distant metastasis.  -due to the location in the cervical esophagus, Dr. Kipp Brood feels she is not a surgical candidate.  -she received concurrent chemoRT with carbo/taxol 03/08/21 - 04/14/21. She required dose reduction with last cycle due to radiation dermatitis, hoarseness, dysphagia, and odynophagia.  -given PDL1+, she began immunotherapy with Nivolumab on 05/21/21. Will plan to give this for a total of a year if NED. -neck CT 06/18/21 showed decrease in size of mass-like enlargement of cervical esophagus and size of lymph nodes. -EGD on 06/21/21 by Dr. Carlean Purl showed only inflammation. Biopsy was negative for residual cancer. -chest CT 07/15/21 showed resolution of esophageal mass, otherwise NED. -EGD on 5/3 by Dr. Carlean Purl showed no evidence for disease progression, just benign appearing esophageal stenosis.  -labs reviewed, overall adequate to proceed with Nivo today. She is now receiving every 4 weeks.   2. Dysphagia, weight loss, cough -Secondary to #1 -She underwent G-tube placement by Dr. Harlow Asa on 02/22/21 -neck CT 06/18/21 showed decrease in size of esophageal mass -she continues  esophageal dilation under Dr. Carlean Purl, most recently on 09/22/21 -she is still taking in nutrition by feeding tube, but she is able to swallow soft foods. Weight overall stable.  -She still has intermittent dry cough, possible related to mild aspiration. Dr. [name] previously prescribed codeine cough syrup -The patient mentions today her cough is much improved at this time and it used to be "quite severe".    3. 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 Trigeminal neuralgia, sciatica, headaches -On lamotrigene and oxcarbazepine  -pain has worsened before and during treatment -f/up Dr. Jaynee Eagles    5. HTN -continue benazepril, furosemide -monitoring closely    6. Goals of Care/Code Status -Code status was DNR from hospitalization 05/16/20, changed to full code during 02/26/21 hospitalization for feeding tube placement       PLAN: -proceed with Nivo today -lab, f/u, and Nivo every 4 weeks -plan to repeat CT scan in July per Dr. [name] last note. Likely will be  ordered at her next appointment on 11/19/21 -Nutrition seeing in infusion room today.    No orders of the defined types were placed in this encounter.     The total time spent in the appointment was 20-29 minutes.   Adriene Padula L Celvin Taney, PA-C 10/22/21

## 2021-10-21 ENCOUNTER — Telehealth: Payer: Self-pay | Admitting: Adult Health

## 2021-10-21 NOTE — Telephone Encounter (Signed)
Pt called back.  She is c/o of her balance and memory fog as SE of the Oxcarbamazepine (156m po bid). (Last 07/2021 does make note of this to MM/NP.  She is also on Lamotrigine 2070mpo bid. Pt has esophageal cancer.  After her surgery with gamma knife she was able to taper to 15034mnce daily, but then had breakthru pain and went back up to BID dosing.  Her husband relayed that she did feel like that made a difference.  So pts question is to change to something else.  She has call to pcp to make sure nothing else is going on.  I relayed may call back Monday.  He was ok with this.

## 2021-10-21 NOTE — Telephone Encounter (Signed)
Pt would like a call from the nurse to discuss medication taking for Trigeminal Neuralgia. Having side effects (mental, balance)from the medication, want to know if there is alternative medication.

## 2021-10-21 NOTE — Telephone Encounter (Signed)
I called pt and LMVM for her that returned call about TN, medication options.

## 2021-10-22 ENCOUNTER — Inpatient Hospital Stay: Payer: Medicare Other

## 2021-10-22 ENCOUNTER — Inpatient Hospital Stay: Payer: Medicare Other | Attending: Nurse Practitioner

## 2021-10-22 ENCOUNTER — Inpatient Hospital Stay: Payer: Medicare Other | Admitting: Physician Assistant

## 2021-10-22 ENCOUNTER — Other Ambulatory Visit: Payer: Self-pay

## 2021-10-22 ENCOUNTER — Inpatient Hospital Stay: Payer: Medicare Other | Admitting: Dietician

## 2021-10-22 VITALS — BP 118/58 | HR 59 | Temp 97.9°F | Resp 17 | Ht 66.0 in | Wt 131.9 lb

## 2021-10-22 DIAGNOSIS — Z5112 Encounter for antineoplastic immunotherapy: Secondary | ICD-10-CM | POA: Insufficient documentation

## 2021-10-22 DIAGNOSIS — Z79899 Other long term (current) drug therapy: Secondary | ICD-10-CM | POA: Insufficient documentation

## 2021-10-22 DIAGNOSIS — C153 Malignant neoplasm of upper third of esophagus: Secondary | ICD-10-CM | POA: Insufficient documentation

## 2021-10-22 LAB — CMP (CANCER CENTER ONLY)
ALT: 13 U/L (ref 0–44)
AST: 17 U/L (ref 15–41)
Albumin: 3.6 g/dL (ref 3.5–5.0)
Alkaline Phosphatase: 94 U/L (ref 38–126)
Anion gap: 3 — ABNORMAL LOW (ref 5–15)
BUN: 20 mg/dL (ref 8–23)
CO2: 31 mmol/L (ref 22–32)
Calcium: 9.7 mg/dL (ref 8.9–10.3)
Chloride: 103 mmol/L (ref 98–111)
Creatinine: 0.85 mg/dL (ref 0.44–1.00)
GFR, Estimated: >60 mL/min (ref >60)
Glucose, Bld: 163 mg/dL — ABNORMAL HIGH (ref 70–99)
Potassium: 4 mmol/L (ref 3.5–5.1)
Sodium: 137 mmol/L (ref 135–145)
Total Bilirubin: 0.8 mg/dL (ref 0.3–1.2)
Total Protein: 6 g/dL — ABNORMAL LOW (ref 6.5–8.1)

## 2021-10-22 LAB — CBC WITH DIFFERENTIAL (CANCER CENTER ONLY)
Abs Immature Granulocytes: 0.02 10*3/uL (ref 0.00–0.07)
Basophils Absolute: 0 10*3/uL (ref 0.0–0.1)
Basophils Relative: 0 %
Eosinophils Absolute: 0.1 10*3/uL (ref 0.0–0.5)
Eosinophils Relative: 1 %
HCT: 39.5 % (ref 36.0–46.0)
Hemoglobin: 13.1 g/dL (ref 12.0–15.0)
Immature Granulocytes: 0 %
Lymphocytes Relative: 10 %
Lymphs Abs: 0.5 10*3/uL — ABNORMAL LOW (ref 0.7–4.0)
MCH: 29.2 pg (ref 26.0–34.0)
MCHC: 33.2 g/dL (ref 30.0–36.0)
MCV: 88.2 fL (ref 80.0–100.0)
Monocytes Absolute: 0.5 10*3/uL (ref 0.1–1.0)
Monocytes Relative: 9 %
Neutro Abs: 4 10*3/uL (ref 1.7–7.7)
Neutrophils Relative %: 80 %
Platelet Count: 160 10*3/uL (ref 150–400)
RBC: 4.48 MIL/uL (ref 3.87–5.11)
RDW: 14.3 % (ref 11.5–15.5)
WBC Count: 5.1 10*3/uL (ref 4.0–10.5)
nRBC: 0 % (ref 0.0–0.2)

## 2021-10-22 LAB — TSH: TSH: 1.138 u[IU]/mL (ref 0.350–4.500)

## 2021-10-22 MED ORDER — SODIUM CHLORIDE 0.9 % IV SOLN: Freq: Once | INTRAVENOUS | Status: AC

## 2021-10-22 MED ORDER — SODIUM CHLORIDE 0.9 % IV SOLN
480.0000 mg | Freq: Once | INTRAVENOUS | Status: AC
  Administered 2021-10-22: 480 mg via INTRAVENOUS
  Filled 2021-10-22: qty 48

## 2021-10-22 NOTE — Progress Notes (Signed)
Nutrition Follow-up:  Patient completed concurrent chemoradiation for esophageal cancer. She is currently receiving Nivolumab q4 weeks, started 05/21/21.  5/25 MBS - recommendations for regular diet with thin liquids  Met with patient during infusion. She reports good appetite and increased intake of soft solids. Patient recalls eating small portions 3 times/day. She is having pain from sciatica at visit, reports eating a variety of foods but unable to provide recall. Patient had a pack of nabs this morning. She is supplementing intake with tube feedings, reports giving one carton Nutren 1.5 twice daily. Occasionally she will do 3. Patient requesting sample carton of formula as well as assistance with tube feeding this afternoon.   Medications: reviewed  Labs: glucose 163  Anthropometrics: Weight 131 lb 14.4 oz today decreased   5/5 - 133 lb 7 oz  4/7 - 133 lb 3/23 - 131 lb 9.6 oz   NUTRITION DIAGNOSIS: Inadequate oral intake improving, pt supplementing with tube feedings   INTERVENTION:  Continue oral intake of soft moist foods, increasing as tolerated Encouraged high calorie, high protein foods to promote weight stability while weaning from tube feedings - shake recipes given  Patient agreeable to bedtime snack Provided one carton Osmolite 1.5 + syringe, bolus feeding completing with pt during infusion  Continue supplemental tube feedings - 2 cartons Nutren 1.5 via PEG provides 750 kcal, 34 g protein)     MONITORING, EVALUATION, GOAL: weight trends, intake, tube feeding    NEXT VISIT: To be scheduled with treatment

## 2021-10-22 NOTE — Patient Instructions (Signed)
[facility]  Discharge Instructions: Thank you for choosing [facility] to provide your oncology and hematology care.   If you have a lab appointment with the [facility], please go directly to the [facility] and check in at the registration area.   Wear comfortable clothing and clothing appropriate for easy access to any Portacath or PICC line.   We strive to give you quality time with your provider. You may need to reschedule your appointment if you arrive late (15 or more minutes).  Arriving late affects you and other patients whose appointments are after yours.  Also, if you miss three or more appointments without notifying the office, you may be dismissed from the clinic at the provider's discretion.      For prescription refill requests, have your pharmacy contact our office and allow 72 hours for refills to be completed.    Today you received the following chemotherapy and/or immunotherapy agents : Opdivo      To help prevent nausea and vomiting after your treatment, we encourage you to take your nausea medication as directed.  BELOW ARE SYMPTOMS THAT SHOULD BE REPORTED IMMEDIATELY: *FEVER GREATER THAN 100.4 F (38 C) OR HIGHER *CHILLS OR SWEATING *NAUSEA AND VOMITING THAT IS NOT CONTROLLED WITH YOUR NAUSEA MEDICATION *UNUSUAL SHORTNESS OF BREATH *UNUSUAL BRUISING OR BLEEDING *URINARY PROBLEMS (pain or burning when urinating, or frequent urination) *BOWEL PROBLEMS (unusual diarrhea, constipation, pain near the anus) TENDERNESS IN MOUTH AND THROAT WITH OR WITHOUT PRESENCE OF ULCERS (sore throat, sores in mouth, or a toothache) UNUSUAL RASH, SWELLING OR PAIN  UNUSUAL VAGINAL DISCHARGE OR ITCHING   Items with * indicate a potential emergency and should be followed up as soon as possible or go to the Emergency Department if any problems should occur.  Please show the CHEMOTHERAPY ALERT CARD or IMMUNOTHERAPY ALERT CARD at check-in to  the Emergency Department and triage nurse.  Should you have questions after your visit or need to cancel or reschedule your appointment, please contact [facility]  Dept: [phone]  and follow the prompts.  Office hours are 8:00 a.m. to 4:30 p.m. Monday - Friday. Please note that voicemails left after 4:00 p.m. may not be returned until the following business day.  We are closed weekends and major holidays. You have access to a nurse at all times for urgent questions. Please call the main number to the clinic Dept: [phone] and follow the prompts.   For any non-urgent questions, you may also contact your provider using MyChart. We now offer e-Visits for anyone 35 and older to request care online for non-urgent symptoms. For details visit [email or web address].   Also download the MyChart app! Go to the app store, search "MyChart", open the app, select [facility], and log in with your MyChart username and password.  Due to Covid, a mask is required upon entering the hospital/clinic. If you do not have a mask, one will be given to you upon arrival. For doctor visits, patients may have 1 support person aged 33 or older with them. For treatment visits, patients cannot have anyone with them due to current Covid guidelines and our immunocompromised population.

## 2021-10-23 LAB — T4: T4, Total: 5.4 ug/dL (ref 4.5–12.0)

## 2021-10-24 ENCOUNTER — Encounter: Payer: Self-pay | Admitting: Internal Medicine

## 2021-10-25 ENCOUNTER — Telehealth: Payer: Self-pay | Admitting: Internal Medicine

## 2021-10-25 NOTE — Telephone Encounter (Signed)
Per MyChart message--pt wants Dr. Regis Bill to send her a message that she can respond to her.  She said she would like to "talk" to Dr. Regis Bill through a message on [place].

## 2021-10-25 NOTE — Telephone Encounter (Signed)
Dr. Jaynee Eagles, what are you thoughts? We have adjusted her medication before and she also adjust it herself in the past. Is there another medication you think I should try?

## 2021-10-26 NOTE — Telephone Encounter (Signed)
Please let patient know we can try keppra 250 mg BID

## 2021-10-27 MED ORDER — LEVETIRACETAM 250 MG PO TABS: ORAL_TABLET | ORAL | 1 refills | Status: DC

## 2021-10-27 NOTE — Telephone Encounter (Addendum)
Spoke to MM/NP and verbal order for keppra 215m po 1 tablet daily for 2 wks then increase to 1 tablet po bid.  If tolerates will taper oxcarbamzepine.  She is not cleared for swallowing tablets so then will crush and place in GT.  Husband verbalized understanding. Placed prescription order to pharmacy.

## 2021-11-01 ENCOUNTER — Telehealth: Payer: Self-pay | Admitting: Pharmacist

## 2021-11-01 DIAGNOSIS — C159 Malignant neoplasm of esophagus, unspecified: Secondary | ICD-10-CM | POA: Diagnosis not present

## 2021-11-01 DIAGNOSIS — R1310 Dysphagia, unspecified: Secondary | ICD-10-CM | POA: Diagnosis not present

## 2021-11-01 NOTE — Chronic Care Management (AMB) (Signed)
Chronic Care Management Pharmacy Assistant   Name: [name]  MRN: [id number] DOB: [birth year]  Reason for Encounter: Disease State   Conditions to be addressed/monitored: HTN  Recent office visits:  11/09/21 Angela Medin, MD - Patient presented for Injury of right wrist and other concerns. No medication changes.  Recent consult visits:  11/11/21 Angela Cooter, MD - Patient presented for Pain in right wrist and other concerns. No medication changes.  10/22/21 Barrera, Angela L, PA-C ([facility]) - Patient presented for Oncology treatment. No medication changes.  10/14/21 Angela Mayer, MD [name]) - Patient presented for Modified Barium Swallow.   10/07/21 Angela Mons, PA-C ( Neurosurgery) - Patient presented via telehealth for Trigeminal neuralgia of right side of face and other concerns. No medication changes.  09/24/21 Angela Merle, MD (Oncology) -Patient presented for Malignant neoplasm of upper third esophagus. No medication changes.  09/08/21 [name], [name] ( Neuro) - Patient presented for Trigeminal neuralgia. No medication changes.  08/27/21 Angela Merle, MD (Oncology) -Patient presented for Malignant neoplasm of upper third esophagus. No medication changes.  07/23/21 Angela [name], NP ( Neurology) - Patient presented for Trigeminal neuralgia and other concerns.Changed Oxcarbazepine.  07/30/21 Angela Merle, MD (Oncology) -Patient presented for Malignant neoplasm of upper third esophagus. No medication changes.  Hospital visits:   Patient presented to [facility] on 11/07/21 due to Wrist pain.  New?Medications Started at [facility] Discharge:?? -started  none  Medication Changes at Hospital Discharge: -Changed  none  Medications Discontinued at Hospital Discharge: -Stopped  none  Medications that remain the same after Hospital Discharge:??  -All other medications will remain the same.       Medication Reconciliation  was completed by comparing discharge summary, patient's EMR and Pharmacy list, and upon discussion with patient.  Patient presented to [facility] on 09/22/21 due to Radiation induced esophageal stricture. Patient was present for 4 hours.  New?Medications Started at [facility] Discharge:?? -started  none  Medication Changes at Hospital Discharge: -Changed  none  Medications Discontinued at Hospital Discharge: -Stopped  none  Medications that remain the same after Hospital Discharge:??  -All other medications will remain the same.      Patient presented to [facility] on 08/23/21 due to Radiation induced esophageal stricture. Patient was present for 4 hours.  New?Medications Started at [facility] Discharge:?? -started  none  Medication Changes at Hospital Discharge: -Changed  sucralfate CARAFATE  Medications Discontinued at Hospital Discharge: -Stopped  none  Medications that remain the same after Hospital Discharge:??  -All other medications will remain the same.     Patient presented to [facility] on 08/23/21 due to Radiation induced esophageal stricture. Patient was present for 4 hours.  New?Medications Started at [facility] Discharge:?? -started  none  Medication Changes at Hospital Discharge: -Changed  sucralfate CARAFATE  Medications Discontinued at Hospital Discharge: -Stopped  none  Medications that remain the same after Hospital Discharge:??  -All other medications will remain the same.    Medications: Outpatient Encounter Medications as of 11/01/2021  Medication Sig Note   benazepril (LOTENSIN) 20 MG tablet TAKE 1 TABLET(20 MG) BY MOUTH TWICE DAILY (Patient taking differently: Place 20 mg into feeding tube every morning.)    carvedilol (COREG) 25 MG tablet TAKE 1 TABLET(25 MG) BY MOUTH TWICE DAILY WITH A MEAL    conjugated estrogens (PREMARIN) vaginal cream Place 1 applicator vaginally once a week.    fluticasone (FLONASE) 50  MCG/ACT  nasal spray Place 2 sprays into both nostrils as needed for allergies or rhinitis. (Patient not taking: Reported on 09/22/2021) 09/22/2021: On hold while doing chemo treatments   furosemide (LASIX) 20 MG tablet TAKE 1 TABLET(20 MG) BY MOUTH DAILY (Patient taking differently: Place 20 mg into feeding tube every morning.)    guaiFENesin-codeine 100-10 MG/5ML syrup TAKE 5 ML BY MOUTH EVERY 6 HOURS AS NEEDED FOR COUGH (Patient taking differently: Place 5 mLs into feeding tube 2 (two) times daily as needed for cough.)    lamoTRIgine (LAMICTAL) 100 MG tablet Place 200 mg into feeding tube 2 (two) times daily. For pain    levETIRAcetam (KEPPRA) 250 MG tablet Take 1 tablet in evening for 2 weeks then increase 1 tablet twice daily.    loperamide (IMODIUM) 2 MG capsule 2 mg daily as needed for diarrhea or loose stools. Per tube    Multiple Minerals-Vitamins (CAL-MAG-ZINC-D PO) Place 1 tablet into feeding tube 2 (two) times daily.    Multiple Vitamin (MULTIVITAMIN WITH MINERALS) TABS tablet Place 1 tablet into feeding tube in the morning.    Nutritional Supplements (NUTREN 1.5) LIQD Place 237 mLs into feeding tube 5 (five) times daily.    omeprazole (PRILOSEC) 20 MG capsule 20 mg at bedtime. Per tube    ondansetron (ZOFRAN-ODT) 4 MG disintegrating tablet DISSOLVE 1 TO 2 TABLETS(4 TO 8 MG) ON THE TONGUE EVERY 8 HOURS AS NEEDED FOR NAUSEA OR VOMITING    Oxcarbazepine (TRILEPTAL) 300 MG tablet Place 150 mg into feeding tube 2 (two) times daily. For pain    Polyethyl Glycol-Propyl Glycol (SYSTANE) 0.4-0.3 % GEL ophthalmic gel Place 1 application into both eyes 2 (two) times daily as needed (dry eyes).    prochlorperazine (COMPAZINE) 10 MG tablet TAKE 1 TABLET(10 MG) BY MOUTH EVERY 6 HOURS AS NEEDED FOR NAUSEA OR VOMITING (Patient taking differently: Take 10 mg by mouth every 6 (six) hours as needed for nausea or vomiting.)    sucralfate (CARAFATE) 1 GM/10ML suspension Take 10 mLs (1 g total) by mouth 4 (four)  times daily -  with meals and at bedtime.    No facility-administered encounter medications on file as of 11/01/2021.   Reviewed chart prior to disease state call. Spoke with patient regarding BP  Recent Office Vitals: BP Readings from Last 3 Encounters:  10/22/21 (!) 118/58  09/24/21 135/76  09/22/21 (!) 220/98   Pulse Readings from Last 3 Encounters:  10/22/21 (!) 59  09/24/21 61  09/22/21 60    Wt Readings from Last 3 Encounters:  10/22/21 131 lb 14.4 oz (59.8 kg)  09/24/21 133 lb 7 oz (60.5 kg)  09/22/21 132 lb (59.9 kg)     Kidney Function Lab Results  Component Value Date/Time   CREATININE 0.85 10/22/2021 09:56 AM   CREATININE 0.90 09/24/2021 09:43 AM   GFR 58.37 (L) 02/04/2021 03:31 PM   GFRNONAA >60 10/22/2021 09:56 AM   GFRAA 62 07/08/2020 04:53 PM       Latest Ref Rng & Units 10/22/2021    9:56 AM 09/24/2021    9:43 AM 08/27/2021    1:09 PM  BMP  Glucose 70 - 99 mg/dL 163  136  137   BUN 8 - 23 mg/dL 20  21  23    Creatinine 0.44 - 1.00 mg/dL 0.85  0.90  0.77   Sodium 135 - 145 mmol/L 137  139  134   Potassium 3.5 - 5.1 mmol/L 4.0  3.8  4.4   Chloride 98 -  111 mmol/L 103  103  98   CO2 22 - 32 mmol/L 31  31  32   Calcium 8.9 - 10.3 mg/dL 9.7  9.6  9.6     Current antihypertensive regimen:  Benazepril 20 mg 1 tablet daily - Appropriate, Query effective, Safe, Accessible Carvedilol 25 mg 1 tablet twice daily - Appropriate, Query effective, Safe, Accessible  Furosemide 20 mg 1 tablet daily - Appropriate, Query effective, Safe, Accessible  Unable to reach for assessment will re-attempt in a few weeks   Care Gaps: COVID Booster - Overdue TDAP - Overdue BP- 118/58 ( 10/22/21) AWV- 11/22 CCM- 9/23  Star Rating Drugs: Benazepril (Lotensin) 20 mg - Last filled 09/21/21 90 DS at  [place] Pharmacist Assistant [phone]

## 2021-11-03 NOTE — Telephone Encounter (Signed)
Not sure how to help with this message?  How about a visit  virtual /phone /or in person ?Marland Kitchen

## 2021-11-03 NOTE — Telephone Encounter (Signed)
Does she want to  make a  visit ? Virtual or other?

## 2021-11-05 NOTE — Telephone Encounter (Signed)
No visit, I think she wanted the feature of sending a message to clinical side by my understanding.  She wants to receive a message from Dr. Regis Bill so she can send one back.

## 2021-11-07 ENCOUNTER — Telehealth: Payer: Self-pay | Admitting: Internal Medicine

## 2021-11-07 DIAGNOSIS — S50311A Abrasion of right elbow, initial encounter: Secondary | ICD-10-CM | POA: Diagnosis not present

## 2021-11-07 DIAGNOSIS — S63501A Unspecified sprain of right wrist, initial encounter: Secondary | ICD-10-CM | POA: Diagnosis not present

## 2021-11-07 DIAGNOSIS — S40021A Contusion of right upper arm, initial encounter: Secondary | ICD-10-CM | POA: Diagnosis not present

## 2021-11-07 DIAGNOSIS — R110 Nausea: Secondary | ICD-10-CM | POA: Diagnosis not present

## 2021-11-07 NOTE — Telephone Encounter (Signed)
Please contact her and see if she would be able to repeat an EGD with dilation on July 18 at [facility]  I think it would be appropriate.  Would have liked to have repeated sooner but scheduling constraints have not allowed.  I know she was improved but I think looking and dilating again makes sense.   However if she feels like things are going really well we do not have to.  Please let me know.  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

## 2021-11-08 ENCOUNTER — Other Ambulatory Visit: Payer: Self-pay

## 2021-11-08 DIAGNOSIS — K222 Esophageal obstruction: Secondary | ICD-10-CM

## 2021-11-08 NOTE — Telephone Encounter (Signed)
Pt made aware of Dr. Carlean Purl recommendations: Pt was in agreement for the EGD with Dilation: Pt ordered and scheduled for 12/07/2021 at [facility] at 10:45: Pt to arrive at 9:15 AM: Pt made aware  Prep instructions were sent to pt via My Chart: Pt made aware  Ambulatory referral for GI entered into Epic Pt verbalized understanding with all questions answered.

## 2021-11-09 ENCOUNTER — Encounter: Payer: Self-pay | Admitting: Internal Medicine

## 2021-11-09 ENCOUNTER — Ambulatory Visit (INDEPENDENT_AMBULATORY_CARE_PROVIDER_SITE_OTHER): Payer: Medicare Other | Admitting: Internal Medicine

## 2021-11-09 VITALS — BP 94/60 | HR 58 | Temp 98.8°F | Wt 129.3 lb

## 2021-11-09 DIAGNOSIS — R351 Nocturia: Secondary | ICD-10-CM

## 2021-11-09 DIAGNOSIS — I10 Essential (primary) hypertension: Secondary | ICD-10-CM

## 2021-11-09 DIAGNOSIS — R739 Hyperglycemia, unspecified: Secondary | ICD-10-CM

## 2021-11-09 DIAGNOSIS — C153 Malignant neoplasm of upper third of esophagus: Secondary | ICD-10-CM

## 2021-11-09 DIAGNOSIS — S6991XS Unspecified injury of right wrist, hand and finger(s), sequela: Secondary | ICD-10-CM

## 2021-11-09 DIAGNOSIS — S51011S Laceration without foreign body of right elbow, sequela: Secondary | ICD-10-CM | POA: Diagnosis not present

## 2021-11-09 DIAGNOSIS — Z79899 Other long term (current) drug therapy: Secondary | ICD-10-CM | POA: Diagnosis not present

## 2021-11-09 DIAGNOSIS — R413 Other amnesia: Secondary | ICD-10-CM

## 2021-11-09 DIAGNOSIS — G500 Trigeminal neuralgia: Secondary | ICD-10-CM

## 2021-11-09 DIAGNOSIS — Z9181 History of falling: Secondary | ICD-10-CM

## 2021-11-09 DIAGNOSIS — R6882 Decreased libido: Secondary | ICD-10-CM

## 2021-11-09 LAB — POCT GLYCOSYLATED HEMOGLOBIN (HGB A1C): Hemoglobin A1C: 5.2 % (ref 4.0–5.6)

## 2021-11-09 NOTE — Progress Notes (Signed)
Chief Complaint  Patient presents with   Consult    Medication check    HPI: [name] 81 y.o. come in for a number of issues. Under rx for esophageal cancer  radiation had  tube and trying to get weight back up.  Fell 2 days ago and seen in UC  neg x ray wrist arm  laceration  felt to be at risk for infection   given splint and  to fu ortho if still bothersome.    Pain ful  is r  handed no numbness.  Concerned not as smart as used to b e. No even  has work findings delay.   Pain tgn: dr Vertell Limber sent for gamma knife  one rx   didn't help pain? But "new med did'  ? Lamictal and keppra. Dr Vertell Limber had to retire for medical reasons.   Fu other was not  satisfactory  fortunately pain is controlled right now.   Concern about ongoing  frequent nocturia   bothersome past hx of sig utis  has seen dr Jeffie Pollock in  past . Decrease libido not matching couple . A problem   No hx of  low bp issues at home  on lotensin and coreg. No syncope no cp sob   ROS: See pertinent positives and negatives per HPI.  Past Medical History:  Diagnosis Date   Abdominal pain 05/29/2013   s/p rx of cephalo resistant e coli   but last rx NG  now residular ?  bladder sx repeat cx sx rx to ty and uro consult    ADJ DISORDER WITH MIXED ANXIETY & DEPRESSED MOOD 03/03/2010   Qualifier: Diagnosis of  By: [name], [name]    Agent resistant to multiple antibiotics 05/29/2013   e coli   bu NG on fu.     Anemia    Anxiety    ARF (acute renal failure) (HCC) 03/12/2015   Closed head injury 02/01/2011   from syncope and had scalp laceration  neg ct .     Closed head injury 5-6 yrs ago   Colitis 85/88/5027   Complication of anesthesia    migraine several hours after general anesthesia   Depression    esophageal ca 01/2021   Fatty liver    Gall stones 2016   see ct scan neg HIDA    GERD (gastroesophageal reflux disease)    Hearing aid worn    HOH (hard of hearing)    both ears   Hyperlipidemia     Hypertension    echo nl lv function  mild dilitation 2009   Kidney infection    few yrs ago in hospital   Medication side effect 09/02/2010   Poss muscle se of 10 crestor    Migraine    hypnic HA eval by Dr. 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    Pyelonephritis 03/12/2015   Sensation of pain in anesthetized distribution of trigeminal nerve    Syncope 02/01/2011   In shower on vacation  sustained head laceration  8 sutures Had ed visit neg head ct labs and x ray    Trigeminal neuralgia pain     Family History  Problem Relation Age of Onset   Cancer Mother        uterine   Ovarian cancer Mother    Stroke Mother    Alcohol abuse Father    Stroke Father  Diabetes Brother    Cancer Paternal Aunt        leukemia, unknown type   Seizures Daughter    Hypertension Other    Colon cancer Neg Hx    Dementia Neg Hx    Alzheimer's disease Neg Hx     Social History   Socioeconomic History   Marital status: Married    Spouse name: Not on file   Number of children: 2   Years of education: Not on file   Highest education level: Not on file  Occupational History    Comment: retired Forensic psychologist  Tobacco Use   Smoking status: Never   Smokeless tobacco: Never  Vaping Use   Vaping Use: Never used  Substance and Sexual Activity   Alcohol use: Not Currently    Alcohol/week: 2.0 standard drinks of alcohol    Types: 2 Glasses of wine per week    Comment: occ wine   Drug use: No   Sexual activity: Not on file  Other Topics Concern   Not on file  Social History Narrative   Married   HH of 2-3 (god daughter)   Pets 2 dogs   Non smoker    Child is a Engineer, drilling   G2P2      Caffeine: 2 cups/day   Social Determinants of Health   Financial Resource Strain: Low Risk  (04/06/2021)   Overall Financial Resource Strain (CARDIA)    Difficulty of Paying Living Expenses: Not hard at all  Food Insecurity: No Food Insecurity (04/06/2021)   Hunger  Vital Sign    Worried About Running Out of Food in the Last Year: Never true    [place] in the Last Year: Never true  Transportation Needs: No Transportation Needs (04/06/2021)   PRAPARE - Hydrologist (Medical): No    Lack of Transportation (Non-Medical): No  Physical Activity: Sufficiently Active (04/06/2021)   Exercise Vital Sign    Days of Exercise per Week: 2 days    Minutes of Exercise per Session: 150+ min  Stress: No Stress Concern Present (04/06/2021)   [place]    Feeling of Stress : Not at all  Social Connections: Moderately Integrated (03/25/2020)   Social Connection and Isolation Panel [NHANES]    Frequency of Communication with Friends and Family: More than three times a week    Frequency of Social Gatherings with Friends and Family: Once a week    Attends Religious Services: 1 to 4 times per year    Active Member of [employer] or Organizations: No    Attends Archivist Meetings: Never    Marital Status: Married    Outpatient Medications Prior to Visit  Medication Sig Dispense Refill   benazepril (LOTENSIN) 20 MG tablet TAKE 1 TABLET(20 MG) BY MOUTH TWICE DAILY (Patient taking differently: Place 20 mg into feeding tube every morning.) 180 tablet 0   carvedilol (COREG) 25 MG tablet TAKE 1 TABLET(25 MG) BY MOUTH TWICE DAILY WITH A MEAL 60 tablet 3   conjugated estrogens (PREMARIN) vaginal cream Place 1 applicator vaginally once a week.     fluticasone (FLONASE) 50 MCG/ACT nasal spray Place 2 sprays into both nostrils as needed for allergies or rhinitis. 16 g 5   furosemide (LASIX) 20 MG tablet TAKE 1 TABLET(20 MG) BY MOUTH DAILY (Patient taking differently: Place 20 mg into feeding tube every morning.) 90 tablet 0   guaiFENesin-codeine 100-10  MG/5ML syrup TAKE 5 ML BY MOUTH EVERY 6 HOURS AS NEEDED FOR COUGH (Patient taking differently: Place 5 mLs into feeding tube 2  (two) times daily as needed for cough.) 240 mL 0   lamoTRIgine (LAMICTAL) 100 MG tablet Place 200 mg into feeding tube 2 (two) times daily. For pain     levETIRAcetam (KEPPRA) 250 MG tablet Take 1 tablet in evening for 2 weeks then increase 1 tablet twice daily. 60 tablet 1   loperamide (IMODIUM) 2 MG capsule 2 mg daily as needed for diarrhea or loose stools. Per tube     Multiple Minerals-Vitamins (CAL-MAG-ZINC-D PO) Place 1 tablet into feeding tube 2 (two) times daily.     Multiple Vitamin (MULTIVITAMIN WITH MINERALS) TABS tablet Place 1 tablet into feeding tube in the morning.     Nutritional Supplements (NUTREN 1.5) LIQD Place 237 mLs into feeding tube 5 (five) times daily.     omeprazole (PRILOSEC) 20 MG capsule 20 mg at bedtime. Per tube     ondansetron (ZOFRAN-ODT) 4 MG disintegrating tablet DISSOLVE 1 TO 2 TABLETS(4 TO 8 MG) ON THE TONGUE EVERY 8 HOURS AS NEEDED FOR NAUSEA OR VOMITING 30 tablet 1   Oxcarbazepine (TRILEPTAL) 300 MG tablet Place 150 mg into feeding tube 2 (two) times daily. For pain     Polyethyl Glycol-Propyl Glycol (SYSTANE) 0.4-0.3 % GEL ophthalmic gel Place 1 application into both eyes 2 (two) times daily as needed (dry eyes).     prochlorperazine (COMPAZINE) 10 MG tablet TAKE 1 TABLET(10 MG) BY MOUTH EVERY 6 HOURS AS NEEDED FOR NAUSEA OR VOMITING (Patient taking differently: Take 10 mg by mouth every 6 (six) hours as needed for nausea or vomiting.) 30 tablet 1   sucralfate (CARAFATE) 1 GM/10ML suspension Take 10 mLs (1 g total) by mouth 4 (four) times daily -  with meals and at bedtime. 420 mL 5   No facility-administered medications prior to visit.     EXAM:  BP 94/60   Pulse (!) 58   Temp 98.8 F (37.1 C)   Wt 129 lb 4.8 oz (58.7 kg)   SpO2 97%   BMI 20.87 kg/m   Body mass index is 20.87 kg/m. Hard of hearing not wearing  hearing aid GENERAL: vitals reviewed and listed above, alert, oriented, appears well hydrated and in no acute distress in [facility] has   walker   right forearm wrapped  in splint  right fingers swollen warm mobile  HEENT: atraumatic, conjunctiva  clear, no obvious abnormalities on inspection of external nose and ears  NECK: no obvious masses on inspection palpation  LUNGS: clear to auscultation bilaterally, no wheezes, rales or rhonchi, good air movement CV: HRRR, no clubbingnl cap refill     pink purplish puffy  feet but no pain  pulse present  no lesion  MS: r arn  painful in splint  Removed splint to make srue no compression sx   pain distal lateral forearm and wrist.  Skin long deep avulsion laceration about 3.5 cm some with puncrure area   no infection  right elbow  area.  PSYCH: pleasant and cooperative, no obvious depression or anxiety nl  conversation  voice hoarse .  Lab Results  Component Value Date   WBC 5.1 10/22/2021   HGB 13.1 10/22/2021   HCT 39.5 10/22/2021   PLT 160 10/22/2021   GLUCOSE 163 (H) 10/22/2021   CHOL 279 (H) 05/27/2019   TRIG 78.0 05/27/2019   HDL 118.80 05/27/2019   LDLDIRECT  135.9 10/16/2012   LDLCALC 145 (H) 05/27/2019   ALT 13 10/22/2021   AST 17 10/22/2021   NA 137 10/22/2021   K 4.0 10/22/2021   CL 103 10/22/2021   CREATININE 0.85 10/22/2021   BUN 20 10/22/2021   CO2 31 10/22/2021   TSH 1.138 10/22/2021   INR 1.1 02/22/2021   HGBA1C 5.2 11/09/2021   BP Readings from Last 3 Encounters:  11/09/21 94/60  10/22/21 (!) 118/58  09/24/21 135/76    ASSESSMENT AND PLAN:  Discussed the following assessment and plan:  Injury of right wrist, sequela - Plan: Ambulatory referral to Orthopedic Surgery  Essential hypertension - pt state not that low at home   Medication management  Laceration of skin of right elbow, sequela  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 Most pressing  though appears to be right upper extremity injury soft tissue laceration rule out occult fracture versus ligament damage she is still having significant pain over the area of injury. Referral to orthopedic for this week prefer Dr. Trevor Mace group who helped her with her right shoulder. I did redressed the laceration just with a nonstick Telfa but some of the wound was slightly gaping. No active bleeding. In regard to the question about memory this could be related to medicines condition other I will share this with her neurologist for her input. Advise seeing the urologist again about the nocturia and may be later about libido although with the amount of medical conditions ongoing this could affect things. Blood pressure was low in the office today which she states does not feel that way at home take a few reading days and get back with [place] and we may have to adjust. Plan follow-up in 3 months either way She is disheartened that Dr. Vertell Limber had to stop practice as he was very helpful in regard to the [name] and ideas and knowledge.  She does not think the gamma knife helped but the medication did and at this point is hopeful that pain will be managed. -Patient advised to return or notify health care team  if  new concerns arise. 70 minutes time  Patient Instructions  If bp remains low  we should decrease the BP medication  dose  Send in some readings for 2-3 days from home.   Referring you to ortho dr Adela Ports office  for fu of the arm wrist injury  .   Will share info with Dr Jaynee Eagles .  Concerns about brain function.   Plan fu   depending   ? 3 months or so  Labs from cancer team were unrevealing . Excep BG was up.  But hg a1c was good at 5.2 so no diabetes.    [name]. Leisl Spurrier M.D.

## 2021-11-09 NOTE — Telephone Encounter (Signed)
Patient was seen in the office today for a visit

## 2021-11-09 NOTE — Patient Instructions (Addendum)
If bp remains low  we should decrease the BP medication  dose  Send in some readings for 2-3 days from home.   Referring you to ortho dr Adela Ports office  for fu of the arm wrist injury  .   Will share info with Dr Jaynee Eagles .  Concerns about brain function.   Plan fu   depending   ? 3 months or so  Labs from cancer team were unrevealing . Excep BG was up.  But hg a1c was good at 5.2 so no diabetes.

## 2021-11-11 ENCOUNTER — Ambulatory Visit (INDEPENDENT_AMBULATORY_CARE_PROVIDER_SITE_OTHER): Payer: Medicare Other

## 2021-11-11 ENCOUNTER — Ambulatory Visit: Payer: Self-pay

## 2021-11-11 ENCOUNTER — Ambulatory Visit: Payer: Medicare Other | Admitting: Orthopedic Surgery

## 2021-11-11 ENCOUNTER — Encounter: Payer: Self-pay | Admitting: Orthopedic Surgery

## 2021-11-11 VITALS — BP 138/83 | HR 59 | Ht 64.0 in | Wt 130.0 lb

## 2021-11-11 DIAGNOSIS — S41111A Laceration without foreign body of right upper arm, initial encounter: Secondary | ICD-10-CM

## 2021-11-11 DIAGNOSIS — M25531 Pain in right wrist: Secondary | ICD-10-CM

## 2021-11-16 ENCOUNTER — Other Ambulatory Visit: Payer: Self-pay | Admitting: Internal Medicine

## 2021-11-16 ENCOUNTER — Telehealth: Payer: Self-pay | Admitting: Adult Health

## 2021-11-16 DIAGNOSIS — G500 Trigeminal neuralgia: Secondary | ICD-10-CM

## 2021-11-17 MED ORDER — LEVETIRACETAM 100 MG/ML PO SOLN: 250.0000 mg | Freq: Two times a day (BID) | ORAL | 3 refills | Status: DC

## 2021-11-17 NOTE — Telephone Encounter (Addendum)
Spoke with both the patient and her husband.  She states her pain has worsened since she started the [place].  She states yesterday was a bad day and she felt her balance was off.  Her husband states she skipped a dose last night and today has been better.  He also states that she read the directions and said not to grind or crush the tablets so she tried to take 1 by mouth and choked a little on it but got it down.  I advised that if she has not been cleared to take things by mouth, she should not.  He confirmed the patient is taking Oxcarbazepine 150 mg twice daily and he has increased her Lamotrigine to 250 mg twice daily from 200 mg twice daily. He states he did this on his own as she has been on this before. I kindly advised against self-dosing as this could potentially be dangerous and we need to be on the same page. I let him know I would speak with the nurse practitioner to see what she recommends and if patient is to continue the keppra we can switch to liquid. He verbalized understanding and appreciation.

## 2021-11-17 NOTE — Addendum Note (Signed)
Addended by: Brandon Melnick on: 11/17/2021 02:12 PM   Modules accepted: Orders

## 2021-11-17 NOTE — Telephone Encounter (Signed)
I called and spoke to husband.  Pt is taking lamotrigine 218m po bid (noted from last OV).  Last time pt was taking 2021mpo bid, (trying to come down).  But went back up when having pain (which is now on 25034mo bid).  Oxcarbamazepine 150m12m bid now.  Had been on keppra for 2 wks, noted this last week pain.  ? If keppra or if flare.  They will try liquid with  her other medications and will let us [place].

## 2021-11-17 NOTE — Telephone Encounter (Signed)
Pt initial request was asking for oral solution.  Placed order. Keppra oral solution 17m/ml.  Take  2521mvia peg tube bid (2.87m487mo bid).  Dispensed 473m50mttle).

## 2021-11-17 NOTE — Addendum Note (Signed)
Addended by: Trudie Buckler on: 11/17/2021 02:37 PM   Modules accepted: Orders

## 2021-11-17 NOTE — Telephone Encounter (Signed)
Can switch to liquid to see if it works better for her

## 2021-11-19 ENCOUNTER — Encounter: Payer: Self-pay | Admitting: Hematology

## 2021-11-19 ENCOUNTER — Inpatient Hospital Stay: Payer: Medicare Other

## 2021-11-19 ENCOUNTER — Inpatient Hospital Stay: Payer: Medicare Other | Admitting: Hematology

## 2021-11-19 ENCOUNTER — Other Ambulatory Visit: Payer: Self-pay

## 2021-11-19 VITALS — BP 120/64 | HR 57 | Temp 97.7°F | Resp 20 | Wt 132.7 lb

## 2021-11-19 DIAGNOSIS — Z5112 Encounter for antineoplastic immunotherapy: Secondary | ICD-10-CM | POA: Diagnosis not present

## 2021-11-19 DIAGNOSIS — Z79899 Other long term (current) drug therapy: Secondary | ICD-10-CM | POA: Diagnosis not present

## 2021-11-19 DIAGNOSIS — C153 Malignant neoplasm of upper third of esophagus: Secondary | ICD-10-CM | POA: Diagnosis not present

## 2021-11-19 LAB — CMP (CANCER CENTER ONLY)
ALT: 13 U/L (ref 0–44)
AST: 18 U/L (ref 15–41)
Albumin: 3.8 g/dL (ref 3.5–5.0)
Alkaline Phosphatase: 100 U/L (ref 38–126)
Anion gap: 5 (ref 5–15)
BUN: 28 mg/dL — ABNORMAL HIGH (ref 8–23)
CO2: 31 mmol/L (ref 22–32)
Calcium: 9.9 mg/dL (ref 8.9–10.3)
Chloride: 103 mmol/L (ref 98–111)
Creatinine: 0.88 mg/dL (ref 0.44–1.00)
GFR, Estimated: >60 mL/min (ref >60)
Glucose, Bld: 112 mg/dL — ABNORMAL HIGH (ref 70–99)
Potassium: 4 mmol/L (ref 3.5–5.1)
Sodium: 139 mmol/L (ref 135–145)
Total Bilirubin: 0.6 mg/dL (ref 0.3–1.2)
Total Protein: 6.6 g/dL (ref 6.5–8.1)

## 2021-11-19 LAB — CBC WITH DIFFERENTIAL (CANCER CENTER ONLY)
Abs Immature Granulocytes: 0.01 10*3/uL (ref 0.00–0.07)
Basophils Absolute: 0 10*3/uL (ref 0.0–0.1)
Basophils Relative: 1 %
Eosinophils Absolute: 0.1 10*3/uL (ref 0.0–0.5)
Eosinophils Relative: 2 %
HCT: 40.6 % (ref 36.0–46.0)
Hemoglobin: 13.3 g/dL (ref 12.0–15.0)
Immature Granulocytes: 0 %
Lymphocytes Relative: 10 %
Lymphs Abs: 0.5 10*3/uL — ABNORMAL LOW (ref 0.7–4.0)
MCH: 29.2 pg (ref 26.0–34.0)
MCHC: 32.8 g/dL (ref 30.0–36.0)
MCV: 89 fL (ref 80.0–100.0)
Monocytes Absolute: 0.4 10*3/uL (ref 0.1–1.0)
Monocytes Relative: 8 %
Neutro Abs: 3.9 10*3/uL (ref 1.7–7.7)
Neutrophils Relative %: 79 %
Platelet Count: 188 10*3/uL (ref 150–400)
RBC: 4.56 MIL/uL (ref 3.87–5.11)
RDW: 13.7 % (ref 11.5–15.5)
WBC Count: 4.9 10*3/uL (ref 4.0–10.5)
nRBC: 0 % (ref 0.0–0.2)

## 2021-11-19 LAB — TSH: TSH: 1.32 u[IU]/mL (ref 0.350–4.500)

## 2021-11-19 MED ORDER — SODIUM CHLORIDE 0.9 % IV SOLN
480.0000 mg | Freq: Once | INTRAVENOUS | Status: AC
  Administered 2021-11-19: 480 mg via INTRAVENOUS
  Filled 2021-11-19: qty 48

## 2021-11-19 MED ORDER — SODIUM CHLORIDE 0.9 % IV SOLN: Freq: Once | INTRAVENOUS | Status: AC

## 2021-11-19 NOTE — Progress Notes (Signed)
[place]   [phone] [phone]   Clinic Follow up Note   Patient Care Team: [name], [name], MD as PCP - General [name], MD as PCP - Cardiology (Cardiology) Cindie Crumbly (Neurosurgery) Irine Seal, MD as Attending Physician (Urology) Erline Levine, MD as Consulting Physician (Neurosurgery) [name], MD as Consulting Physician (Neurology) Viona Gilmore, [facility] as Pharmacist (Pharmacist) [name], MD as Consulting Physician (Gastroenterology) Carol Ada, MD as Consulting Physician (Gastroenterology) [name], MD (Inactive) as Consulting Physician (Otolaryngology) [name], MD as Consulting Physician (Hematology) Kyung Rudd, MD as Consulting Physician (Radiation Oncology) Kyung Rudd, MD as Consulting Physician (Radiation Oncology)  Date of Service:  11/19/2021  CHIEF COMPLAINT: f/u of esophageal cancer  CURRENT THERAPY:  Nivolumab, started 05/21/21, currently q4weeks  ASSESSMENT & PLAN:  [name] is a 81 y.o. female with   1. Squamous cell carcinoma of cervical esophagus, cTxN2M0 -presented with dysphagia and weight loss. EGD 9/15 and CT neck 02/05/21 showed 6 cm mass in the cervical/upper esophagus with possible local adenopathy. 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  -due to the location in the cervical esophagus, Dr. Kipp Brood feels she is not a surgical candidate.  -she received concurrent chemoRT with carbo/taxol 03/08/21 - 04/14/21. She required dose reduction with last cycle due to radiation dermatitis, hoarseness, dysphagia, and odynophagia.  -given PDL1+, she began immunotherapy with Nivolumab on 05/21/21. Will plan to give this for a total of a year if NED. -neck CT 06/18/21 showed decrease in size of  mass-like enlargement of cervical esophagus and size of lymph nodes. -EGD on 06/21/21 by Dr. Carlean Purl showed only inflammation. Biopsy was negative for residual cancer. -chest CT 07/15/21 showed resolution of esophageal mass, otherwise NED. -EGD on 09/22/21 by Dr. Carlean Purl showed no evidence for residual disease, just benign appearing esophageal stenosis. Next scheduled for 12/07/21 -labs reviewed, overall stable and adequate to proceed with Nivo today. She is now receiving every 4 weeks.   2. Dysphagia, weight loss, cough -Secondary to #1 -She underwent G-tube placement by Dr. Harlow Asa on 02/22/21 -neck CT 06/18/21 showed decrease in size of esophageal mass -she continues esophageal dilation under Dr. Carlean Purl, most recently on 09/22/21, next 12/07/21 -she is still taking in nutrition by feeding tube, but she is able to swallow soft foods. Weight overall stable.  -She still has intermittent dry cough, possible related to mild aspiration. She is using codeine cough syrup as needed and noted the codeine also helps her trigeminal pain.   3. 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 Trigeminal neuralgia, sciatica, headaches -On lamotrigene and oxcarbazepine  -pain has worsened before and during treatment -f/up Dr. Jaynee Eagles    5. HTN -continue benazepril, furosemide -monitoring closely    6. Goals of Care/Code Status -Code status was DNR from hospitalization 05/16/20, changed to full code during 02/26/21 hospitalization for feeding tube placement       PLAN: -proceed with Nivo today and every 4 weeks -f/u and Nivo in 4 weeks, with lab and CT several days before   No problem-specific Assessment & Plan notes found for this encounter.   SUMMARY OF ONCOLOGIC HISTORY: Oncology History  Malignant neoplasm of upper third esophagus ([place])  12/21/2020 Imaging   Laryngoscopy Comments:    On fiberoptic laryngoscopy through the right nostril the nasopharynx was clear.  The base of tongue vallecula and epiglottis were normal.    Piriform sinuses were clear bilaterally and vocal cords were clear with  normal vocal mobility.  No structural abnormalities noted.   01/22/2021 Imaging   DG esophagus IMPRESSION: 1. Luminal narrowing in the high cervical esophagus over a 3 cm segment is highly concerning for esophageal neoplasm. Inflammatory process would be a secondary consideration. Luminal narrowing occurs approximately at the C5-C7 vertebral body level just below the glottis. Patient experienced several episodes of choking related to this luminal narrowing. Recommend expedient upper GI endoscopy for evaluation. 2. Distal thoracic esophagus and GE junction appear normal.   02/04/2021 Procedure   EGD by Dr. Carlean Purl impression- One mass-like severe stenosis was found in the upper third of the esophagus. The stenosis was not traversed.   02/05/2021 Imaging   CT soft tissue neck w contrast IMPRESSION: Mass-like soft tissue thickening and mucosal hyperenhancement of the cervical esophagus and hypopharynx, spanning the C4-T1 levels, likely reflecting an esophageal malignancy. This measures up to 2.4 x 3.4 cm in transaxial dimensions, and 6.3 cm in craniocaudal dimension. Associated severe effacement of the esophageal lumen.   Centrally necrotic right paratracheal lymph node beneath the level of the mass, measuring 1.3 x 0.9 cm, and likely reflecting a site of nodal metastatic disease.   Additional lymph nodes along the posterior and inferior aspect of the right thyroid lobe (along the right aspect of the esophageal mass), which measure subcentimeter but are asymmetrically prominent and highly suspicious for additional sites of nodal metastatic disease.   02/12/2021 Pathology Results   FINAL  MICROSCOPIC DIAGNOSIS:  A. ESOPHAGUS, UPPER CERVICAL #1, BIOPSY:  - Squamous cell carcinoma.  B. ESOPHAGUS, UPPER CERVICAL #2, BIOPSY:  - Squamous cell carcinoma.    02/12/2021 Cancer Staging   Staging form: Esophagus - Squamous Cell Carcinoma, AJCC 8th Edition - Clinical stage from 02/12/2021: Stage Unknown (cTX, cN2, cM0) - Signed by [name], MD on 03/08/2021 Stage prefix: Initial diagnosis   02/18/2021 Initial Diagnosis   Malignant neoplasm of upper third esophagus ([place])   02/23/2021 PET scan   IMPRESSION: Markedly hypermetabolic focal masslike thickening of the cervical esophagus, compatible with primary esophageal malignancy.   Markedly hypermetabolic right upper paratracheal lymph node, compatible with metastatic disease.   Small right upper paratracheal and right level IIA lymph nodes with mild hypermetabolic activity, concerning for additional sites of nodal metastatic disease.   Aortic Atherosclerosis (ICD10-I70.0).   03/09/2021 - 04/13/2021 Chemotherapy   Patient is on Treatment Plan : ESOPHAGUS Carboplatin/PACLitaxel weekly x 6 weeks with XRT       05/21/2021 -  Chemotherapy   Patient is on Treatment Plan : GASTROESOPHAGEAL Nivolumab q14d x 8 cycles / Nivolumab q28d        INTERVAL HISTORY:  Nishi Neiswonger is here for a follow up of esophageal cancer. She was last seen by PA Cassie on 10/22/21. She presents to the clinic accompanied by her husband. She reports she is doing well except for some issues with trigeminal neuralgia.   All other systems were reviewed with the patient and are negative.  MEDICAL HISTORY:  Past Medical History:  Diagnosis Date   Abdominal pain 05/29/2013   s/p rx of cephalo resistant e coli   but last rx NG  now residular ?  bladder sx repeat  cx sx rx to ty and uro consult    ADJ DISORDER WITH MIXED ANXIETY & DEPRESSED MOOD 03/03/2010   Qualifier: Diagnosis of  By: [name], [name]    Agent resistant to multiple antibiotics  05/29/2013   e coli   bu NG on fu.     Anemia    Anxiety    ARF (acute renal failure) (HCC) 03/12/2015   Closed head injury 02/01/2011   from syncope and had scalp laceration  neg ct .     Closed head injury 5-6 yrs ago   Colitis 19/62/2297   Complication of anesthesia    migraine several hours after general anesthesia   Depression    esophageal ca 01/2021   Fatty liver    Gall stones 2016   see ct scan neg HIDA    GERD (gastroesophageal reflux disease)    Hearing aid worn    HOH (hard of hearing)    both ears   Hyperlipidemia    Hypertension    echo nl lv function  mild dilitation 2009   Kidney infection    few yrs ago in hospital   Medication side effect 09/02/2010   Poss muscle se of 10 crestor    Migraine    hypnic HA eval by Dr. 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    Pyelonephritis 03/12/2015   Sensation of pain in anesthetized distribution of trigeminal nerve    Syncope 02/01/2011   In shower on vacation  sustained head laceration  8 sutures Had ed visit neg head ct labs and x ray    Trigeminal neuralgia pain     SURGICAL HISTORY: Past Surgical History:  Procedure Laterality Date   ABDOMINAL HYSTERECTOMY  2002   tubal   BACK SURGERY     2 times, for sciatic nerve pain   BALLOON DILATION  06/21/2021   Procedure: DILATION BALLOON used for esophageal stricture;  Surgeon: Irving Copas., MD;  Location: [facility];  Service: Gastroenterology;;   CARDIAC CATHETERIZATION  2000   chest pains neg   CHOLECYSTECTOMY N/A 02/21/2017   Procedure: LAPAROSCOPIC CHOLECYSTECTOMY WITH INTRAOPERATIVE CHOLANGIOGRAM;  Surgeon: Armandina Gemma, MD;  Location: [facility];  Service: General;  Laterality: N/A;   COLONOSCOPY     multiple   CRANIOTOMY  12/09/2011   nerve decompression right trigeminal    DIRECT LARYNGOSCOPY N/A 02/12/2021   Procedure: DIRECT LARYNGOSCOPY AND BIOPSY POSSIBLE FROZEN;  Surgeon: [name], MD;  Location: [facility];  Service: ENT;  Laterality: N/A;   DOPPLER ECHOCARDIOGRAPHY  2009   nl lv function mild lv dilitation   ESOPHAGOGASTRODUODENOSCOPY (EGD) WITH PROPOFOL N/A 06/21/2021   Procedure: ESOPHAGOGASTRODUODENOSCOPY (EGD) WITH PROPOFOL;  Surgeon: Irving Copas., MD;  Location: [facility];  Service: Gastroenterology;  Laterality: N/A;  Request Fluoroscopy; Plan in for dilation   ESOPHAGOGASTRODUODENOSCOPY (EGD) WITH PROPOFOL N/A 07/19/2021   Procedure: ESOPHAGOGASTRODUODENOSCOPY (EGD) WITH PROPOFOL;  Surgeon: Rush [facility] Telford Nab., MD;  [facility]: [facility];  Service: Endoscopy;  Laterality: N/A;  Dilation fluoro   ESOPHAGOGASTRODUODENOSCOPY (EGD) WITH PROPOFOL N/A 08/05/2021   Procedure: ESOPHAGOGASTRODUODENOSCOPY (EGD) WITH PROPOFOL - fluoro;  Surgeon: Rush Landmark Telford Nab., MD;  Location: [facility];  Service: Gastroenterology;  Laterality: N/A;   ESOPHAGOGASTRODUODENOSCOPY (EGD) WITH PROPOFOL N/A 08/23/2021   Procedure: ESOPHAGOGASTRODUODENOSCOPY (EGD) WITH PROPOFOL;  Surgeon: [name], MD;  [facility];  Service: Gastroenterology;  Laterality: N/A;  Using Fluoroscopy and  esophageal dilation   ESOPHAGOGASTRODUODENOSCOPY (EGD) WITH PROPOFOL N/A 09/22/2021   Procedure: ESOPHAGOGASTRODUODENOSCOPY (EGD) WITH PROPOFOL;  Surgeon: [name], MD;  [facility];  Service: Gastroenterology;  Laterality: N/A;  EGD with savary and flour   EYE SURGERY Bilateral    ioc for catatracts   GASTROSTOMY N/A 02/26/2021   Procedure: OPEN GASTROSTOMY TUBE PLACEMENT;  Surgeon: Armandina Gemma, MD;  Location: [facility];  Service: General;  Laterality: N/A;   IR GASTROSTOMY TUBE MOD SED  02/22/2021   IR REPLACE G-TUBE SIMPLE WO FLUORO  09/07/2021   laparoscopic gallbladder surgery  02/16/2017   Fax from [facility] Bilateral 2002   rt shoulder surgery     SAVORY DILATION N/A 06/21/2021   Procedure: SAVORY DILATION;  Surgeon: Irving Copas., MD;  Location: [facility];  Service: Gastroenterology;  Laterality: N/A;   SAVORY DILATION N/A 07/19/2021   Procedure: SAVORY DILATION;  Surgeon: Rush Landmark Telford Nab., MD;  [facility];  Service: Endoscopy;  Laterality: N/A;   SAVORY DILATION N/A 08/05/2021   Procedure: SAVORY DILATION;  Surgeon: Rush Landmark Telford Nab., MD;  Location: [facility];  Service: Gastroenterology;  Laterality: N/A;   SAVORY DILATION N/A 08/23/2021   Procedure: SAVORY DILATION;  Surgeon: [name], MD;  [facility];  Service: Gastroenterology;  Laterality: N/A;   SAVORY DILATION N/A 09/22/2021   Procedure: SAVORY DILATION;  Surgeon: [name], MD;  [facility];  Service: Gastroenterology;  Laterality: N/A;    I have reviewed the social history and family history with the patient and they are unchanged from previous note.  ALLERGIES:  is allergic to bactrim [sulfamethoxazole-trimethoprim] and hydrocodone.  MEDICATIONS:  Current Outpatient Medications  Medication Sig Dispense Refill   benazepril (LOTENSIN) 20 MG tablet TAKE 1 TABLET(20 MG) BY MOUTH TWICE DAILY (Patient taking differently: Place 20 mg into feeding tube every morning.) 180 tablet 0   carvedilol (COREG) 25 MG tablet TAKE 1 TABLET(25 MG) BY MOUTH TWICE DAILY WITH A MEAL 60 tablet 3   conjugated estrogens (PREMARIN) vaginal cream Place 1 applicator vaginally once a week.     fluticasone (FLONASE) 50 MCG/ACT nasal spray Place 2 sprays into both nostrils as needed for allergies or rhinitis. 16 g 5   furosemide (LASIX) 20 MG tablet TAKE 1 TABLET(20 MG) BY MOUTH DAILY 90 tablet 0   guaiFENesin-codeine 100-10 MG/5ML syrup TAKE 5 ML BY MOUTH EVERY 6 HOURS AS NEEDED FOR COUGH (Patient taking differently: Place 5 mLs into feeding tube 2 (two) times daily as needed for cough.) 240 mL 0   lamoTRIgine (LAMICTAL) 100 MG tablet Place 200 mg into feeding tube 2 (two) times daily. For pain     levETIRAcetam  (KEPPRA) 100 MG/ML solution Place 2.5 mLs (250 mg total) into feeding tube 2 (two) times daily. 473 mL 3   loperamide (IMODIUM) 2 MG capsule 2 mg daily as needed for diarrhea or loose stools. Per tube     Multiple Minerals-Vitamins (CAL-MAG-ZINC-D PO) Place 1 tablet into feeding tube 2 (two) times daily.     Multiple Vitamin (MULTIVITAMIN WITH MINERALS) TABS tablet Place 1 tablet into feeding tube in the morning.     Nutritional Supplements (NUTREN 1.5) LIQD Place 237 mLs into feeding tube 5 (five) times daily.     omeprazole (PRILOSEC) 20 MG capsule 20 mg at bedtime. Per tube     ondansetron (ZOFRAN-ODT) 4 MG disintegrating tablet DISSOLVE 1 TO 2 TABLETS(4 TO 8 MG) ON  THE TONGUE EVERY 8 HOURS AS NEEDED FOR NAUSEA OR VOMITING 30 tablet 1   Oxcarbazepine (TRILEPTAL) 300 MG tablet Place 150 mg into feeding tube 2 (two) times daily. For pain     Polyethyl Glycol-Propyl Glycol (SYSTANE) 0.4-0.3 % GEL ophthalmic gel Place 1 application into both eyes 2 (two) times daily as needed (dry eyes).     prochlorperazine (COMPAZINE) 10 MG tablet TAKE 1 TABLET(10 MG) BY MOUTH EVERY 6 HOURS AS NEEDED FOR NAUSEA OR VOMITING (Patient taking differently: Take 10 mg by mouth every 6 (six) hours as needed for nausea or vomiting.) 30 tablet 1   sucralfate (CARAFATE) 1 GM/10ML suspension Take 10 mLs (1 g total) by mouth 4 (four) times daily -  with meals and at bedtime. 420 mL 5   No current facility-administered medications for this visit.    PHYSICAL EXAMINATION: ECOG PERFORMANCE STATUS: 2 - Symptomatic, <50% confined to bed  Vitals:   11/19/21 0959  BP: 120/64  Pulse: (!) 57  Resp: 20  Temp: 97.7 F (36.5 C)  SpO2: 97%   Wt Readings from Last 3 Encounters:  11/19/21 132 lb 11.2 oz (60.2 kg)  11/11/21 130 lb (59 kg)  11/09/21 129 lb 4.8 oz (58.7 kg)     GENERAL:alert, no distress and comfortable SKIN: skin color, texture, turgor are normal, no rashes or significant lesions EYES: normal, Conjunctiva are  pink and non-injected, sclera clear  LUNGS: clear to auscultation and percussion with normal breathing effort HEART: regular rate & rhythm and no murmurs and no lower extremity edema NEURO: alert & oriented x 3 with fluent speech, no focal motor/sensory deficits  LABORATORY DATA:  I have reviewed the data as listed    Latest Ref Rng & Units 11/19/2021    9:19 AM 10/22/2021    9:56 AM 09/24/2021    9:43 AM  CBC  WBC 4.0 - 10.5 K/uL 4.9  5.1  3.9   Hemoglobin 12.0 - 15.0 g/dL 13.3  13.1  12.7   Hematocrit 36.0 - 46.0 % 40.6  39.5  39.0   Platelets 150 - 400 K/uL 188  160  154         Latest Ref Rng & Units 11/19/2021    9:19 AM 10/22/2021    9:56 AM 09/24/2021    9:43 AM  CMP  Glucose 70 - 99 mg/dL 112  163  136   BUN 8 - 23 mg/dL 28  20  21    Creatinine 0.44 - 1.00 mg/dL 0.88  0.85  0.90   Sodium 135 - 145 mmol/L 139  137  139   Potassium 3.5 - 5.1 mmol/L 4.0  4.0  3.8   Chloride 98 - 111 mmol/L 103  103  103   CO2 22 - 32 mmol/L 31  31  31    Calcium 8.9 - 10.3 mg/dL 9.9  9.7  9.6   Total Protein 6.5 - 8.1 g/dL 6.6  6.0  6.4   Total Bilirubin 0.3 - 1.2 mg/dL 0.6  0.8  0.6   Alkaline Phos 38 - 126 U/L 100  94  97   AST 15 - 41 U/L 18  17  19    ALT 0 - 44 U/L 13  13  15        RADIOGRAPHIC STUDIES: I have personally reviewed the radiological images as listed and agreed with the findings in the report. No results found.    Orders Placed This Encounter  Procedures   CT CHEST ABDOMEN  PELVIS W CONTRAST    Standing Status:   Future    Standing Expiration Date:   11/20/2022    Order Specific Question:   Preferred imaging location?    Answer:   [facility]    Order Specific Question:   Release to patient    Answer:   Immediate    Order Specific Question:   Is Oral Contrast requested for this exam?    Answer:   Yes, Per Radiology protocol   All questions were answered. The patient knows to call the clinic with any problems, questions or concerns. No barriers to learning  was detected. The total time spent in the appointment was 30 minutes.     [name], MD 11/19/2021   I, Wilburn Mylar, am acting as scribe for [name], MD.   I have reviewed the above documentation for accuracy and completeness, and I agree with the above.

## 2021-11-19 NOTE — Patient Instructions (Signed)
[facility] ONCOLOGY   Discharge Instructions: Thank you for choosing [facility] to provide your oncology and hematology care.   If you have a lab appointment with the [facility], please go directly to the [facility] and check in at the registration area.   Wear comfortable clothing and clothing appropriate for easy access to any Portacath or PICC line.   We strive to give you quality time with your provider. You may need to reschedule your appointment if you arrive late (15 or more minutes).  Arriving late affects you and other patients whose appointments are after yours.  Also, if you miss three or more appointments without notifying the office, you may be dismissed from the clinic at the provider's discretion.      For prescription refill requests, have your pharmacy contact our office and allow 72 hours for refills to be completed.    Today you received the following chemotherapy and/or immunotherapy agents: nivolumab      To help prevent nausea and vomiting after your treatment, we encourage you to take your nausea medication as directed.  BELOW ARE SYMPTOMS THAT SHOULD BE REPORTED IMMEDIATELY: *FEVER GREATER THAN 100.4 F (38 C) OR HIGHER *CHILLS OR SWEATING *NAUSEA AND VOMITING THAT IS NOT CONTROLLED WITH YOUR NAUSEA MEDICATION *UNUSUAL SHORTNESS OF BREATH *UNUSUAL BRUISING OR BLEEDING *URINARY PROBLEMS (pain or burning when urinating, or frequent urination) *BOWEL PROBLEMS (unusual diarrhea, constipation, pain near the anus) TENDERNESS IN MOUTH AND THROAT WITH OR WITHOUT PRESENCE OF ULCERS (sore throat, sores in mouth, or a toothache) UNUSUAL RASH, SWELLING OR PAIN  UNUSUAL VAGINAL DISCHARGE OR ITCHING   Items with * indicate a potential emergency and should be followed up as soon as possible or go to the Emergency Department if any problems should occur.  Please show the CHEMOTHERAPY ALERT CARD or IMMUNOTHERAPY ALERT CARD at check-in  to the Emergency Department and triage nurse.  Should you have questions after your visit or need to cancel or reschedule your appointment, please contact [facility]  Dept: [phone]  and follow the prompts.  Office hours are 8:00 a.m. to 4:30 p.m. Monday - Friday. Please note that voicemails left after 4:00 p.m. may not be returned until the following business day.  We are closed weekends and major holidays. You have access to a nurse at all times for urgent questions. Please call the main number to the clinic Dept: [phone] and follow the prompts.   For any non-urgent questions, you may also contact your provider using MyChart. We now offer e-Visits for anyone 42 and older to request care online for non-urgent symptoms. For details visit [email or web address].   Also download the MyChart app! Go to the app store, search "MyChart", open the app, select [facility], and log in with your MyChart username and password.  Masks are optional in the cancer centers. If you would like for your care team to wear a mask while they are taking care of you, please let them know. For doctor visits, patients may have with them one support person who is at least 82 years old. At this time, visitors are not allowed in the infusion area.

## 2021-11-20 LAB — T4: T4, Total: 6.2 ug/dL (ref 4.5–12.0)

## 2021-11-21 ENCOUNTER — Other Ambulatory Visit: Payer: Self-pay | Admitting: Neurology

## 2021-11-24 NOTE — Telephone Encounter (Signed)
Pt has called to report the liquid form of the medication does not help with her pain.  Pt wanted to report that and is asking for a call to discuss.

## 2021-11-25 NOTE — Telephone Encounter (Signed)
I am not sure what else to offer. Perhaps we could get her in with Dr. Jaynee Eagles? Or ask Dr. Jaynee Eagles her thoughts.

## 2021-11-25 NOTE — Addendum Note (Signed)
Addended by: Verlin Grills on: 11/25/2021 04:14 PM   Modules accepted: Orders

## 2021-11-25 NOTE — Telephone Encounter (Signed)
I called pt/pt husband and relayed message. She verbalized understanding/agreement to the plan. [name], MD  P [facility] Calls; [name], NP Caller: Unspecified (1 week ago) I am out of options as well. I would suggest referral pain management next. I cal increase her trileptal, Sandy sent me a script for increased dose.         Pain management referral has been placed.

## 2021-11-25 NOTE — Telephone Encounter (Signed)
I called pt/husband.  Left them message VM,  but also sent mychart message with the increase of the oxcarbamazepine 353m po am and 4567mpo pm.  They are to call me back if questions.

## 2021-11-25 NOTE — Telephone Encounter (Signed)
Spoke to husband after pt handed off to him.  I confirmed dosing of medications she is on for TN. She is taking oxcarbamazepine 162m po bid, lamotrigine 255mpo bid (needs it). And keppra solution 25049mid.  Level of pain comes and goes.  Most of day is ok, but will have about 4 sharp shooting pains (sporadic) cannot correlate times of day.  (He seemed to think could be more stressed when has the pains.  [facility] NP is out of options.  Deferred to you.  Please advise.

## 2021-11-30 ENCOUNTER — Encounter (HOSPITAL_COMMUNITY): Payer: Self-pay | Admitting: Internal Medicine

## 2021-11-30 NOTE — Progress Notes (Signed)
Attempted to obtain medical history via telephone, unable to reach at this time. HIPAA compliant voicemail message left requesting return call to pre surgical testing department. 

## 2021-12-01 ENCOUNTER — Telehealth: Payer: Self-pay | Admitting: Neurology

## 2021-12-01 NOTE — Telephone Encounter (Signed)
Referral resent to [facility] [phone].

## 2021-12-02 ENCOUNTER — Telehealth: Payer: Self-pay | Admitting: Neurology

## 2021-12-02 NOTE — Telephone Encounter (Signed)
I called and no answer.  Will call back next week.

## 2021-12-02 NOTE — Telephone Encounter (Signed)
Pt called wanting to know the update on her Pain Med Referral. She also would like to speak to the RN regarding the last pain meds she was prescribed. She states that the Oxcarbazepine (TRILEPTAL) 300 MG tablet and the lamoTRIgine (LAMICTAL) 100 MG tablet makes her "loopy and incoherent" Please advise.

## 2021-12-03 ENCOUNTER — Telehealth: Payer: Self-pay | Admitting: Hematology

## 2021-12-03 NOTE — Telephone Encounter (Signed)
Left message with rescheduled upcoming appointment due to provider's PAL.

## 2021-12-06 NOTE — Telephone Encounter (Signed)
We have [facility] (looking in website they are located on [address]) coming in tomorrow will ask if they see pts with TN.   (I spoke to wife and husband).   I relayed that I did not have answer for them yet about referral since 2 places have denied.  [facility].  Will touch base with Dr. Jaynee Eagles tomorrow and see if any other Pain clinics reccs.  Husband has decreased the trileptal to 187m am and 3017mpo pm as pt ws loopy and incoherent.  Has been ok since the decrease.

## 2021-12-06 NOTE — Telephone Encounter (Signed)
[  11:56 AM] [name] received call from [facility] re pain referral.  She said they sent it to the provider for review and they said they have nothing to offer to the patient,  so they are declining it.

## 2021-12-06 NOTE — Telephone Encounter (Signed)
I called pt back after checking on referral for PAIN with [facility] and her increase of SE on increase of trileptal. LMVM that returned call.   I called and LVM for nurse for Dr. Zada Finders about referal to [facility].

## 2021-12-07 ENCOUNTER — Encounter (HOSPITAL_COMMUNITY)
Admission: RE | Disposition: A | Discharge status: Home / Self Care | Payer: Self-pay | Source: Ambulatory Visit | Attending: Internal Medicine

## 2021-12-07 ENCOUNTER — Ambulatory Visit (HOSPITAL_COMMUNITY): Payer: Medicare Other

## 2021-12-07 ENCOUNTER — Ambulatory Visit (HOSPITAL_BASED_OUTPATIENT_CLINIC_OR_DEPARTMENT_OTHER): Payer: Medicare Other | Admitting: Certified Registered Nurse Anesthetist

## 2021-12-07 ENCOUNTER — Ambulatory Visit (HOSPITAL_COMMUNITY)
Admission: RE | Admit: 2021-12-07 | Discharge: 2021-12-07 | Disposition: A | Discharge status: Home / Self Care | Payer: Medicare Other | Source: Ambulatory Visit | Attending: Internal Medicine | Admitting: Internal Medicine

## 2021-12-07 ENCOUNTER — Ambulatory Visit (HOSPITAL_COMMUNITY): Payer: Medicare Other | Admitting: Certified Registered Nurse Anesthetist

## 2021-12-07 ENCOUNTER — Encounter (HOSPITAL_COMMUNITY): Payer: Self-pay | Admitting: Internal Medicine

## 2021-12-07 ENCOUNTER — Other Ambulatory Visit: Payer: Self-pay

## 2021-12-07 DIAGNOSIS — F418 Other specified anxiety disorders: Secondary | ICD-10-CM | POA: Diagnosis not present

## 2021-12-07 DIAGNOSIS — K222 Esophageal obstruction: Secondary | ICD-10-CM

## 2021-12-07 DIAGNOSIS — J384 Edema of larynx: Secondary | ICD-10-CM

## 2021-12-07 DIAGNOSIS — R1310 Dysphagia, unspecified: Secondary | ICD-10-CM | POA: Diagnosis not present

## 2021-12-07 DIAGNOSIS — K219 Gastro-esophageal reflux disease without esophagitis: Secondary | ICD-10-CM | POA: Diagnosis not present

## 2021-12-07 DIAGNOSIS — M1990 Unspecified osteoarthritis, unspecified site: Secondary | ICD-10-CM | POA: Insufficient documentation

## 2021-12-07 DIAGNOSIS — I10 Essential (primary) hypertension: Secondary | ICD-10-CM | POA: Diagnosis not present

## 2021-12-07 HISTORY — PX: SAVORY DILATION: SHX5439

## 2021-12-07 HISTORY — PX: ESOPHAGOGASTRODUODENOSCOPY (EGD) WITH PROPOFOL: SHX5813

## 2021-12-07 SURGERY — ESOPHAGOGASTRODUODENOSCOPY (EGD) WITH PROPOFOL
Anesthesia: Monitor Anesthesia Care

## 2021-12-07 MED ORDER — FENTANYL CITRATE (PF) 100 MCG/2ML IJ SOLN
INTRAMUSCULAR | Status: DC | PRN
  Administered 2021-12-07 (×2): 50 ug via INTRAVENOUS

## 2021-12-07 MED ORDER — DEXAMETHASONE SODIUM PHOSPHATE 10 MG/ML IJ SOLN
INTRAMUSCULAR | Status: DC | PRN
  Administered 2021-12-07: 5 mg via INTRAVENOUS

## 2021-12-07 MED ORDER — PROPOFOL 10 MG/ML IV BOLUS
INTRAVENOUS | Status: DC | PRN
  Administered 2021-12-07: 120 mg via INTRAVENOUS

## 2021-12-07 MED ORDER — PROPOFOL 500 MG/50ML IV EMUL
INTRAVENOUS | Status: AC
  Filled 2021-12-07: qty 50

## 2021-12-07 MED ORDER — ONDANSETRON HCL 4 MG/2ML IJ SOLN
INTRAMUSCULAR | Status: DC | PRN
  Administered 2021-12-07: 4 mg via INTRAVENOUS

## 2021-12-07 MED ORDER — SUCCINYLCHOLINE CHLORIDE 200 MG/10ML IV SOSY
PREFILLED_SYRINGE | INTRAVENOUS | Status: DC | PRN
  Administered 2021-12-07: 80 mg via INTRAVENOUS

## 2021-12-07 MED ORDER — FENTANYL CITRATE (PF) 100 MCG/2ML IJ SOLN
INTRAMUSCULAR | Status: AC
  Filled 2021-12-07: qty 2

## 2021-12-07 MED ORDER — LACTATED RINGERS IV SOLN
INTRAVENOUS | Status: DC
  Administered 2021-12-07: 1000 mL via INTRAVENOUS

## 2021-12-07 MED ORDER — LIDOCAINE HCL (CARDIAC) PF 100 MG/5ML IV SOSY
PREFILLED_SYRINGE | INTRAVENOUS | Status: DC | PRN
  Administered 2021-12-07: 60 mg via INTRAVENOUS

## 2021-12-07 MED ORDER — PROPOFOL 10 MG/ML IV BOLUS
INTRAVENOUS | Status: AC
  Filled 2021-12-07: qty 20

## 2021-12-07 SURGICAL SUPPLY — 15 items

## 2021-12-07 NOTE — Progress Notes (Signed)
Tolerated procedure well. She still has the same sensation in throat that she had pre-op "something is blocking"  ENT referral pending

## 2021-12-07 NOTE — H&P (Signed)
[facility] History and Physical   Primary Care Physician:  [name], MD   Reason for Procedure:   Dysphagia, esophageal stricture  Plan:    Upper endoscopy and esophageal dilation     HPI: [name] is a 81 y.o. female w/ proximal radiation induced esophageal stricture here for repeat dilation. Last dilated to 12 mm in May 2023. Tolerating most foods. "Only thing I haven't tried is steak or salads" Has a new intermittent issue x few weeks w/ difficulty swallowing saliva, foods. Asking if can take meds orally - still using G tube for that. Also w/ some chronic intermittent cough     Past Medical History:  Diagnosis Date   Abdominal pain 05/29/2013   s/p rx of cephalo resistant e coli   but last rx NG  now residular ?  bladder sx repeat cx sx rx to ty and uro consult    ADJ DISORDER WITH MIXED ANXIETY & DEPRESSED MOOD 03/03/2010   Qualifier: Diagnosis of  By: [name], [name]    Agent resistant to multiple antibiotics 05/29/2013   e coli   bu NG on fu.     Anemia    Anxiety    ARF (acute renal failure) (HCC) 03/12/2015   Closed head injury 02/01/2011   from syncope and had scalp laceration  neg ct .     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    Pyelonephritis 03/12/2015   Sensation of pain in anesthetized distribution of trigeminal nerve    Syncope  02/01/2011   In shower on vacation  sustained head laceration  8 sutures Had ed visit neg head ct labs and x ray    Trigeminal neuralgia pain     Past Surgical History:  Procedure Laterality Date   ABDOMINAL HYSTERECTOMY  2002   tubal   BACK SURGERY     2 times, for sciatic nerve pain   BALLOON DILATION  06/21/2021   Procedure: DILATION BALLOON used for esophageal stricture;  Surgeon: Irving Copas., MD;  Location: [facility];  Service: Gastroenterology;;   CARDIAC CATHETERIZATION  2000   chest pains neg   CHOLECYSTECTOMY N/A 02/21/2017   Procedure: LAPAROSCOPIC CHOLECYSTECTOMY WITH INTRAOPERATIVE CHOLANGIOGRAM;  Surgeon: Armandina Gemma, MD;  Location: [facility];  Service: General;  Laterality: N/A;   COLONOSCOPY     multiple   CRANIOTOMY  12/09/2011   nerve decompression right trigeminal    DIRECT LARYNGOSCOPY N/A 02/12/2021   Procedure: DIRECT LARYNGOSCOPY AND BIOPSY POSSIBLE FROZEN;  Surgeon: [name], MD;  Location: [place];  Service: ENT;  Laterality: N/A;   DOPPLER ECHOCARDIOGRAPHY  2009   nl  lv function mild lv dilitation   ESOPHAGOGASTRODUODENOSCOPY (EGD) WITH PROPOFOL N/A 06/21/2021   Procedure: ESOPHAGOGASTRODUODENOSCOPY (EGD) WITH PROPOFOL;  Surgeon: Rush Landmark Telford Nab., MD;  Location: [facility];  Service: Gastroenterology;  Laterality: N/A;  Request Fluoroscopy; Plan in for dilation   ESOPHAGOGASTRODUODENOSCOPY (EGD) WITH PROPOFOL N/A 07/19/2021   Procedure: ESOPHAGOGASTRODUODENOSCOPY (EGD) WITH PROPOFOL;  Surgeon: Rush Landmark Telford Nab., MD;  [facility]: [facility];  Service: Endoscopy;  Laterality: N/A;  Dilation fluoro   ESOPHAGOGASTRODUODENOSCOPY (EGD) WITH PROPOFOL N/A 08/05/2021   Procedure: ESOPHAGOGASTRODUODENOSCOPY (EGD) WITH PROPOFOL - fluoro;  Surgeon: Rush Landmark Telford Nab., MD;  Location: [facility];  Service: Gastroenterology;  Laterality: N/A;   ESOPHAGOGASTRODUODENOSCOPY (EGD) WITH PROPOFOL N/A 08/23/2021    Procedure: ESOPHAGOGASTRODUODENOSCOPY (EGD) WITH PROPOFOL;  Surgeon: [name], MD;  [facility];  Service: Gastroenterology;  Laterality: N/A;  Using Fluoroscopy and esophageal dilation   ESOPHAGOGASTRODUODENOSCOPY (EGD) WITH PROPOFOL N/A 09/22/2021   Procedure: ESOPHAGOGASTRODUODENOSCOPY (EGD) WITH PROPOFOL;  Surgeon: [name], MD;  [facility];  Service: Gastroenterology;  Laterality: N/A;  EGD with savary and flour   EYE SURGERY Bilateral    ioc for catatracts   GASTROSTOMY N/A 02/26/2021   Procedure: OPEN GASTROSTOMY TUBE PLACEMENT;  Surgeon: Armandina Gemma, MD;  Location: [facility];  Service: General;  Laterality: N/A;   IR GASTROSTOMY TUBE MOD SED  02/22/2021   IR REPLACE G-TUBE SIMPLE WO FLUORO  09/07/2021   laparoscopic gallbladder surgery  02/16/2017   Fax from [place] Bilateral 2002   rt shoulder surgery     SAVORY DILATION N/A 06/21/2021   Procedure: SAVORY DILATION;  Surgeon: Irving Copas., MD;  Location: [facility];  Service: Gastroenterology;  Laterality: N/A;   SAVORY DILATION N/A 07/19/2021   Procedure: SAVORY DILATION;  Surgeon: Rush Landmark Telford Nab., MD;  [facility];  Service: Endoscopy;  Laterality: N/A;   SAVORY DILATION N/A 08/05/2021   Procedure: SAVORY DILATION;  Surgeon: Rush Landmark Telford Nab., MD;  Location: [facility] [place];  Service: Gastroenterology;  Laterality: N/A;   SAVORY DILATION N/A 08/23/2021   Procedure: SAVORY DILATION;  Surgeon: [name], MD;  [facility]: [facility];  Service: Gastroenterology;  Laterality: N/A;   SAVORY DILATION N/A 09/22/2021   Procedure: SAVORY DILATION;  Surgeon: [name], MD;  Location: [facility];  Service: Gastroenterology;  Laterality: N/A;    Prior to Admission medications   Medication Sig Start Date End Date Taking? Authorizing Provider  benazepril (LOTENSIN) 20 MG tablet TAKE 1 TABLET(20 MG) BY MOUTH TWICE DAILY Patient taking  differently: Place 20 mg into feeding tube 2 (two) times daily. 09/21/21  Yes [name], [name], MD  carvedilol (COREG) 25 MG tablet TAKE 1 TABLET(25 MG) BY MOUTH TWICE DAILY WITH A MEAL 10/13/21  Yes [name], [name], MD  conjugated estrogens (PREMARIN) vaginal cream Place 1 applicator vaginally once a week.   Yes [provider]  fluticasone (FLONASE) 50 MCG/ACT nasal spray Place 2 sprays into both nostrils as needed for allergies or rhinitis. 02/11/20  Yes [name], [name], MD  furosemide (LASIX) 20 MG tablet TAKE 1 TABLET(20 MG) BY MOUTH DAILY 11/16/21  Yes [name], [name], MD  guaiFENesin-codeine 100-10 MG/5ML syrup TAKE 5 ML BY MOUTH EVERY 6 HOURS AS NEEDED FOR COUGH Patient taking differently: Place 5 mLs into feeding tube 2 (two) times daily as needed for cough. 08/27/21  Yes [name], MD  lamoTRIgine (LAMICTAL) 100 MG tablet Place 250 mg into feeding tube 2 (two) times  daily. For pain 08/26/21  Yes [provider]  levETIRAcetam (KEPPRA) 100 MG/ML solution Place 2.5 mLs (250 mg total) into feeding tube 2 (two) times daily. 11/17/21  Yes [name], NP  loperamide (IMODIUM) 2 MG capsule 2 mg daily as needed for diarrhea or loose stools. Per tube   Yes [provider]  Multiple Minerals-Vitamins (CAL-MAG-ZINC-D PO) Place 1 tablet into feeding tube 2 (two) times daily.   Yes [provider]  Multiple Vitamin (MULTIVITAMIN WITH MINERALS) TABS tablet Place 1 tablet into feeding tube in the morning.   Yes [provider]  Nutritional Supplements (NUTREN 1.5) LIQD Place 237 mLs into feeding tube in the morning and at bedtime.   Yes [provider]  omeprazole (PRILOSEC) 20 MG capsule 20 mg at bedtime. Per tube   Yes [provider]  ondansetron (ZOFRAN-ODT) 4 MG disintegrating tablet DISSOLVE 1 TO 2 TABLETS(4 TO 8 MG) ON THE TONGUE EVERY 8 HOURS AS NEEDED FOR NAUSEA OR VOMITING 10/07/21  Yes [name], MD  Oxcarbazepine (TRILEPTAL) 300 MG tablet  TAKE 1 TABLET IN THE MORNING AND 1.5 TABLETS IN THE EVENING 11/25/21  Yes [name], MD  Polyethyl Glycol-Propyl Glycol (SYSTANE) 0.4-0.3 % GEL ophthalmic gel Place 1 application into both eyes 2 (two) times daily as needed (dry eyes).   Yes [provider]  prochlorperazine (COMPAZINE) 10 MG tablet TAKE 1 TABLET(10 MG) BY MOUTH EVERY 6 HOURS AS NEEDED FOR NAUSEA OR VOMITING Patient taking differently: Take 10 mg by mouth every 6 (six) hours as needed for nausea or vomiting. 06/17/21  Yes [name], MD  sucralfate (CARAFATE) 1 GM/10ML suspension Take 10 mLs (1 g total) by mouth 4 (four) times daily -  with meals and at bedtime. 08/23/21  Yes [name], MD    Current Facility-Administered Medications  Medication Dose Route Frequency Provider Last Rate Last Admin   lactated ringers infusion   Intravenous Continuous [name], MD 20 mL/hr at 12/07/21 0955 Continued from Pre-op at 12/07/21 0955    Allergies as of 11/08/2021 - Review Complete 10/22/2021  Allergen Reaction Noted   Bactrim [sulfamethoxazole-trimethoprim] Hives and Itching    Hydrocodone Other (See Comments) 11/24/2015    Family History  Problem Relation Age of Onset   Cancer Mother        uterine   Ovarian cancer Mother    Stroke Mother    Alcohol abuse Father    Stroke Father    Diabetes Brother    Cancer Paternal Aunt        leukemia, unknown type   Seizures Daughter    Hypertension Other    Colon cancer Neg Hx    Dementia Neg Hx    Alzheimer's disease Neg Hx     Social History   Socioeconomic History   Marital status: Married    Spouse name: Not on file   Number of children: 2   Years of education: Not on file   Highest education level: Not on file  Occupational History    Comment: retired Forensic psychologist  Tobacco Use   Smoking status: Never   Smokeless tobacco: Never  Vaping Use   Vaping Use: Never used  Substance and Sexual Activity   Alcohol use: Not Currently     Alcohol/week: 2.0 standard drinks of alcohol    Types: 2 Glasses of wine per week    Comment: occ wine   Drug use: No   Sexual activity: Not on  file  Other Topics Concern   Not on file  Social History Narrative   Married   HH of 2-3 (god daughter)   Pets 2 dogs   Non smoker    Child is a physician   G2P2      Caffeine: 2 cups/day   Social Determinants of Health   Financial Resource Strain: Low Risk  (04/06/2021)   Overall Financial Resource Strain (CARDIA)    Difficulty of Paying Living Expenses: Not hard at all  Food Insecurity: No Food Insecurity (04/06/2021)   Hunger Vital Sign    Worried About Running Out of Food in the Last Year: Never true    [place] in the Last Year: Never true  Transportation Needs: No Transportation Needs (04/06/2021)   PRAPARE - Hydrologist (Medical): No    Lack of Transportation (Non-Medical): No  Physical Activity: Sufficiently Active (04/06/2021)   Exercise Vital Sign    Days of Exercise per Week: 2 days    Minutes of Exercise per Session: 150+ min  Stress: No Stress Concern Present (04/06/2021)   [place]    Feeling of Stress : Not at all  Social Connections: Moderately Integrated (03/25/2020)   Social Connection and Isolation Panel [NHANES]    Frequency of Communication with Friends and Family: More than three times a week    Frequency of Social Gatherings with Friends and Family: Once a week    Attends Religious Services: 1 to 4 times per year    Active Member of [employer] or Organizations: No    Attends Archivist Meetings: Never    Marital Status: Married  Human resources officer Violence: Not At Risk (03/25/2020)   Humiliation, Afraid, Rape, and Kick questionnaire    Fear of Current or Ex-Partner: No    Emotionally Abused: No    Physically Abused: No    Sexually Abused: No    Review of Systems:  All other review of systems  negative except as mentioned in the HPI.  Physical Exam: Vital signs BP (!) 201/97   Pulse 60   Temp 97.7 F (36.5 C) (Oral)   Resp 17   Ht 5' 6"  (1.676 m)   Wt 61.2 kg   SpO2 98%   BMI 21.79 kg/m   General:   Alert,  Well-developed, well-nourished, pleasant and cooperative in NAD Lungs:  Clear throughout to auscultation.   Heart:  Regular rate and rhythm; no murmurs, clicks, rubs,  or gallops. Abdomen:  Soft, nontender and nondistended. Normal bowel sounds.  G tube present LUQ Neuro/Psych:  Alert and cooperative. Normal mood and affect. A and O x 3   @Angla Delahunt  Simonne Maffucci, MD, [facility] [phone] (pager) 12/07/2021 10:20 AM@

## 2021-12-07 NOTE — Anesthesia Postprocedure Evaluation (Addendum)
Anesthesia Post Note  Patient: [name]  Procedure(s) Performed: ESOPHAGOGASTRODUODENOSCOPY (EGD) WITH PROPOFOL SAVORY DILATION     Patient location during evaluation: Endoscopy Anesthesia Type: General Level of consciousness: oriented, awake and alert and awake Pain management: pain level controlled Vital Signs Assessment: post-procedure vital signs reviewed and stable Respiratory status: spontaneous breathing, nonlabored ventilation, respiratory function stable and patient connected to nasal cannula oxygen Cardiovascular status: blood pressure returned to baseline and stable Postop Assessment: no headache, no backache and no apparent nausea or vomiting Anesthetic complications: no   No notable events documented.  Last Vitals:  Vitals:   12/07/21 1125 12/07/21 1136  BP: (!) 186/90 (!) 192/100  Pulse: 61 60  Resp: 18 14  Temp:    SpO2: 96% 94%    Last Pain:  Vitals:   12/07/21 1136  TempSrc:   PainSc: 0-No pain                 [name]

## 2021-12-07 NOTE — Anesthesia Procedure Notes (Signed)
Procedure Name: Intubation Date/Time: 12/07/2021 10:36 AM  Performed by: Raenette Rover, CRNAPre-anesthesia Checklist: Patient identified, Emergency Drugs available, Suction available and Patient being monitored Patient Re-evaluated:Patient Re-evaluated prior to induction Oxygen Delivery Method: Circle system utilized Preoxygenation: Pre-oxygenation with 100% oxygen Induction Type: IV induction Ventilation: Mask ventilation without difficulty Laryngoscope Size: Mac and 3 Grade View: Grade I Tube type: Oral Tube size: 7.0 mm Number of attempts: 1 Airway Equipment and Method: Stylet Placement Confirmation: ETT inserted through vocal cords under direct vision, positive ETCO2 and breath sounds checked- equal and bilateral Secured at: 21 cm Tube secured with: Tape Dental Injury: Teeth and Oropharynx as per pre-operative assessment

## 2021-12-07 NOTE — Transfer of Care (Signed)
Immediate Anesthesia Transfer of Care Note  Patient: [name]  Procedure(s) Performed: ESOPHAGOGASTRODUODENOSCOPY (EGD) WITH PROPOFOL SAVORY DILATION  Patient Location: Endoscopy Unit  Anesthesia Type:General  Level of Consciousness: awake, drowsy and patient cooperative  Airway & Oxygen Therapy: Patient Spontanous Breathing  Post-op Assessment: Report given to RN and Post -op Vital signs reviewed and stable  Post vital signs: Reviewed and stable  Last Vitals:  Vitals Value Taken Time  BP 199/90 12/07/21 1115  Temp    Pulse 59 12/07/21 1116  Resp 13 12/07/21 1116  SpO2 98 % 12/07/21 1116  Vitals shown include unvalidated device data.  Last Pain:  Vitals:   12/07/21 1115  TempSrc: Temporal  PainSc:          Complications: No notable events documented.

## 2021-12-07 NOTE — Addendum Note (Signed)
Addendum  created 12/07/21 1443 by [name], MD   Clinical Note Signed

## 2021-12-07 NOTE — Discharge Instructions (Addendum)
I dilated it to 12 mm again. There is some swelling in the pharynx-larynx area - that could be what you are feeling as far as new swallowing issue but could be from the breathing tube insertion?  I am going to send you back to ENT also.  Hold off on swallowing pills for now.  Contact me Thursday or Friday with an update.    I appreciate the opportunity to care for you. [name], MD, FACG  YOU HAD AN ENDOSCOPIC PROCEDURE TODAY: Refer to the procedure report and other information in the discharge instructions given to you for any specific questions about what was found during the examination. If this information does not answer your questions, please call Dr. Celesta Aver office at [phone] to clarify.   YOU SHOULD EXPECT: Some feelings of bloating in the abdomen. Passage of more gas than usual. Walking can help get rid of the air that was put into your GI tract during the procedure and reduce the bloating. If you had a lower endoscopy (such as a colonoscopy or flexible sigmoidoscopy) you may notice spotting of blood in your stool or on the toilet paper. Some abdominal soreness may be present for a day or two, also.  DIET:   Clear liquids only until 1230 today then soft foods. You can go back to the consistency of foods you have been eating tomorrow, if you feel ok.    ACTIVITY: Your care partner should take you home directly after the procedure. You should plan to take it easy, moving slowly for the rest of the day. You can resume normal activity the day after the procedure however YOU SHOULD NOT DRIVE, use power tools, machinery or perform tasks that involve climbing or major physical exertion for 24 hours (because of the sedation medicines used during the test).   SYMPTOMS TO REPORT IMMEDIATELY: A gastroenterologist can be reached at any hour. Please call [phone]  for any of the following symptoms:   Following upper endoscopy (EGD, EUS, ERCP, esophageal dilation) Vomiting of  blood or coffee ground material  New, significant abdominal pain  New, significant chest pain or pain under the shoulder blades  Painful or persistently difficult swallowing  New shortness of breath  Black, tarry-looking or red, bloody stools

## 2021-12-07 NOTE — Anesthesia Preprocedure Evaluation (Addendum)
Anesthesia Evaluation  Patient identified by MRN, date of birth, ID band Patient awake    Reviewed: Allergy & Precautions, NPO status , Patient's Chart, lab work & pertinent test results, reviewed documented beta blocker date and time   Airway Mallampati: II  TM Distance: >3 FB Neck ROM: Full    Dental  (+) Teeth Intact, Dental Advisory Given   Pulmonary neg pulmonary ROS,    Pulmonary exam normal breath sounds clear to auscultation       Cardiovascular hypertension, Pt. on home beta blockers and Pt. on medications Normal cardiovascular exam Rhythm:Regular Rate:Normal     Neuro/Psych  Headaches, PSYCHIATRIC DISORDERS Anxiety Depression  Neuromuscular disease    GI/Hepatic Neg liver ROS, GERD  Medicated,Esophageal Stricture Esophageal cancer    Endo/Other  negative endocrine ROS  Renal/GU negative Renal ROS     Musculoskeletal  (+) Arthritis ,   Abdominal   Peds  Hematology negative hematology ROS (+)   Anesthesia Other Findings Day of surgery medications reviewed with the patient.  Reproductive/Obstetrics                             Anesthesia Physical Anesthesia Plan  ASA: 2  Anesthesia Plan: General   Post-op Pain Management: Minimal or no pain anticipated   Induction: Intravenous  PONV Risk Score and Plan: 3 and TIVA and Treatment may vary due to age or medical condition  Airway Management Planned: Oral ETT  Additional Equipment:   Intra-op Plan:   Post-operative Plan: Extubation in OR  Informed Consent: I have reviewed the patients History and Physical, chart, labs and discussed the procedure including the risks, benefits and alternatives for the proposed anesthesia with the patient or authorized representative who has indicated his/her understanding and acceptance.     Dental advisory given  Plan Discussed with: CRNA and Anesthesiologist  Anesthesia Plan Comments:         Anesthesia Quick Evaluation

## 2021-12-07 NOTE — Op Note (Signed)
[facility] Patient Name: [name] Procedure Date: 12/07/2021 MRN: [id number] Attending MD: [name] , MD Date of Birth: 31-Jul-1940 CSN: [id number] Age: 81 Admit Type: Outpatient Procedure:                Upper GI endoscopy Indications:              Dysphagia, Stricture of the esophagus, Follow-up of                            esophageal stricture, For therapy of esophageal                            stricture Providers:                [name], MD, Burtis Junes, RN, Gloris Ham, Technician Referring MD:              Medicines:                General Anesthesia Complications:            No immediate complications. Estimated Blood Loss:     Estimated blood loss was minimal. Procedure:                Pre-Anesthesia Assessment:                           - Prior to the procedure, a History and Physical                            was performed, and patient medications and                            allergies were reviewed. The patient's tolerance of                            previous anesthesia was also reviewed. The risks                            and benefits of the procedure and the sedation                            options and risks were discussed with the patient.                            All questions were answered, and informed consent                            was obtained. Prior Anticoagulants: The patient has                            taken no previous anticoagulant or antiplatelet                            agents. ASA Grade  Assessment: II - A patient with                            mild systemic disease. After reviewing the risks                            and benefits, the patient was deemed in                            satisfactory condition to undergo the procedure.                           After obtaining informed consent, the endoscope was                            passed under direct vision.  Throughout the                            procedure, the patient's blood pressure, pulse, and                            oxygen saturations were monitored continuously. The                            GIF-[id number] ([id number] Olympus slim endoscope was                            introduced through the mouth, and advanced to the                            body of the stomach. The GIF-[id number] ([id number]) Olympus                            endoscope was introduced through the and advanced                            to the. The upper GI endoscopy was accomplished                            without difficulty. The patient tolerated the                            procedure well. Scope In: Scope Out: Findings:      Laryngeal edema was visualized, most prominently on the right arytenoid.       The edema is not obstructing the airway.      One benign-appearing, intrinsic severe (stenosis; an endoscope cannot       pass) stenosis was found in the proximal esophagus. This stenosis       measured 9 mm (inner diameter) x 6 cm (in length). The stenosis was       traversed after dilation. A guidewire was placed under fluoroscopic       guidance and the scope was withdrawn. Dilation was performed with a       Savary dilator with mild resistance at  10 mm. A guidewire was placed       under fluoroscopic guidance and the scope was withdrawn. Dilation was       performed with a Savary dilator with mild resistance at 11 mm. A       guidewire was placed under fluoroscopic guidance and the scope was       withdrawn. Dilation was performed with a Savary dilator with moderate       resistance at 12 mm. The dilation site was examined following endoscope       reinsertion and showed moderate improvement in luminal narrowing.       Estimated blood loss was minimal.      The exam was otherwise without abnormality.      The cardia and gastric fundus were normal on retroflexion. Impression:               - Laryngeal edema was  found. ? right arytenoid -                            she has a new dysphagia symptom different than                            typical w/ intermittent swallowing problems w/                            saliva, etc. ? if this is related. ET tube could                            have had some effect here                           - Benign-appearing esophageal stenosis. Dilated.                           - The examination was otherwise normal. EXAM to                            BODY only                           - No specimens collected. Moderate Sedation:      Not Applicable - Patient had care per Anesthesia. Recommendation:           - Patient has a contact number available for                            emergencies. The signs and symptoms of potential                            delayed complications were discussed with the                            patient. Return to normal activities tomorrow.                            Written discharge instructions were provided to the  patient.                           - Clear liquids x 1 hour then soft foods rest of                            day. Start prior diet tomorrow.                           - Do not start swallowing pills yet - hopefully can                            take pills up to 12 mm soon but I want to see how                            she does and if new dysphagia issue resolves. Am                            sending to ENT for evaluation also. Procedure Code(s):        --- Professional ---                           [phone], Esophagoscopy, flexible, transoral; with                            insertion of guide wire followed by passage of                            dilator(s) over guide wire Diagnosis Code(s):        --- Professional ---                           J38.4, Edema of larynx                           K22.2, Esophageal obstruction                           R13.10, Dysphagia, unspecified CPT  copyright 2019 [employer] Association. All rights reserved. The codes documented in this report are preliminary and upon coder review may  be revised to meet current compliance requirements. [name], MD 12/07/2021 11:33:50 AM This report has been signed electronically. Number of Addenda: 0

## 2021-12-08 ENCOUNTER — Telehealth: Payer: Self-pay

## 2021-12-08 ENCOUNTER — Encounter (HOSPITAL_COMMUNITY): Payer: Self-pay | Admitting: Internal Medicine

## 2021-12-08 NOTE — Telephone Encounter (Signed)
Ok placed referral to [facility].  Pt notified should get call.  She appreciated call back.

## 2021-12-08 NOTE — Telephone Encounter (Signed)
Referral resent to [facility] [phone].

## 2021-12-08 NOTE — Telephone Encounter (Signed)
Pt scheduled at [facility] on 12/14/2021 @ 10:20 AM.

## 2021-12-08 NOTE — Telephone Encounter (Signed)
Ok to use [facility] for REFERRAL for PAIN Management. Per Dr. Jaynee Eagles.

## 2021-12-08 NOTE — Telephone Encounter (Signed)
Spoke with Garlon Hatchet at Dr. Danie Binder office and made him aware of Dr. Carlean Purl request to see pt again and explained to Garlon Hatchet what was going on with pt. Garlon Hatchet stated that he will make an appointment to see Dr. Wilburn Cornelia and real out to pt with date and time: Left message for pt to call back

## 2021-12-08 NOTE — Telephone Encounter (Signed)
-----   Message from [name], MD sent at 12/07/2021 11:46 AM EDT ----- Regarding: ENT referral Please ask ENT (Dr. Victorio Palm office) to see her again - she has a new dysphagia symptom in throat (not her esophageal problem) and there was some swelling seen when I did EGD - ? At the arytenoids.  Asking them to check her pharynx and larynx area.  She has hx proximal esophageal cancer and XRT. Esophageal stricture is better but she has additional new symptoms.

## 2021-12-09 NOTE — Telephone Encounter (Signed)
Spoke with Garlon Hatchet at Dr. Danie Binder office and made him aware of Dr. Carlean Purl request to see pt again and explained to Garlon Hatchet what was going on with pt. Garlon Hatchet stated that he will make an appointment to see Dr. Wilburn Cornelia and reach out to pt with date and time: Pt made aware  Pt stated that she received a call with the date and time for the appointment with Dr. Wilburn Cornelia. 01/11/2022 at 3:30 PM Pt verbalized understanding with all questions answered.

## 2021-12-12 ENCOUNTER — Other Ambulatory Visit: Payer: Self-pay | Admitting: Hematology

## 2021-12-13 ENCOUNTER — Other Ambulatory Visit: Payer: Self-pay

## 2021-12-14 ENCOUNTER — Ambulatory Visit (HOSPITAL_COMMUNITY)
Admission: RE | Admit: 2021-12-14 | Discharge: 2021-12-14 | Disposition: A | Discharge status: Home / Self Care | Payer: Medicare Other | Source: Ambulatory Visit | Attending: Hematology | Admitting: Hematology

## 2021-12-14 ENCOUNTER — Telehealth: Payer: Self-pay | Admitting: Pharmacist

## 2021-12-14 DIAGNOSIS — R590 Localized enlarged lymph nodes: Secondary | ICD-10-CM | POA: Diagnosis not present

## 2021-12-14 DIAGNOSIS — I700 Atherosclerosis of aorta: Secondary | ICD-10-CM | POA: Diagnosis not present

## 2021-12-14 DIAGNOSIS — M4316 Spondylolisthesis, lumbar region: Secondary | ICD-10-CM | POA: Diagnosis not present

## 2021-12-14 DIAGNOSIS — C153 Malignant neoplasm of upper third of esophagus: Secondary | ICD-10-CM | POA: Insufficient documentation

## 2021-12-14 DIAGNOSIS — Z8501 Personal history of malignant neoplasm of esophagus: Secondary | ICD-10-CM | POA: Diagnosis not present

## 2021-12-14 MED ORDER — IOHEXOL 300 MG/ML  SOLN
80.0000 mL | Freq: Once | INTRAMUSCULAR | Status: AC | PRN
  Administered 2021-12-14: 80 mL via INTRAVENOUS

## 2021-12-14 MED ORDER — SODIUM CHLORIDE (PF) 0.9 % IJ SOLN
INTRAMUSCULAR | Status: AC
  Filled 2021-12-14: qty 50

## 2021-12-14 NOTE — Chronic Care Management (AMB) (Signed)
Chronic Care Management Pharmacy Assistant   Name: [name]  MRN: [id number] DOB: 01/31/1941  Reason for Encounter: Reschedule follow up call with [name] due to scheduling conflict   Recent office visits:  None  Recent consult visits:  11/19/21 [name], MD (Oncology) - Patient presented for Malignant neoplasm of upper third esophagus and Oncology treatment. No medication changes.  11/11/21 [name], MD (Orthopedic Surg) - Patient presented for pain in right wrist and other concerns. No medication changes.  Hospital visits:  Medication Reconciliation was completed by comparing discharge summary, patient's EMR and Pharmacy list, and upon discussion with patient.  Patient presented to [facility] on 12/07/21 due to EGD with Propofol. Patient was present for 3 hours.  New?Medications Started at [facility] Discharge:?? -started  none  Medication Changes at Hospital Discharge: -Changed  none  Medications Discontinued at Hospital Discharge: -Stopped  None  Medications that remain the same after Hospital Discharge:??  -All other medications will remain the same.    Medications: Outpatient Encounter Medications as of 12/14/2021  Medication Sig Note   benazepril (LOTENSIN) 20 MG tablet TAKE 1 TABLET(20 MG) BY MOUTH TWICE DAILY (Patient taking differently: Place 20 mg into feeding tube 2 (two) times daily.)    carvedilol (COREG) 25 MG tablet TAKE 1 TABLET(25 MG) BY MOUTH TWICE DAILY WITH A MEAL    conjugated estrogens (PREMARIN) vaginal cream Place 1 applicator vaginally once a week.    fluticasone (FLONASE) 50 MCG/ACT nasal spray Place 2 sprays into both nostrils as needed for allergies or rhinitis. 09/22/2021: On hold while doing chemo treatments   furosemide (LASIX) 20 MG tablet TAKE 1 TABLET(20 MG) BY MOUTH DAILY    guaiFENesin-codeine 100-10 MG/5ML syrup TAKE 5 ML BY MOUTH EVERY 6 HOURS AS NEEDED FOR COUGH (Patient taking differently: Place 5  mLs into feeding tube 2 (two) times daily as needed for cough.)    lamoTRIgine (LAMICTAL) 100 MG tablet Place 250 mg into feeding tube 2 (two) times daily. For pain    levETIRAcetam (KEPPRA) 100 MG/ML solution Place 2.5 mLs (250 mg total) into feeding tube 2 (two) times daily.    loperamide (IMODIUM) 2 MG capsule 2 mg daily as needed for diarrhea or loose stools. Per tube    Multiple Minerals-Vitamins (CAL-MAG-ZINC-D PO) Place 1 tablet into feeding tube 2 (two) times daily.    Multiple Vitamin (MULTIVITAMIN WITH MINERALS) TABS tablet Place 1 tablet into feeding tube in the morning.    Nutritional Supplements (NUTREN 1.5) LIQD Place 237 mLs into feeding tube in the morning and at bedtime.    omeprazole (PRILOSEC) 20 MG capsule 20 mg at bedtime. Per tube    ondansetron (ZOFRAN-ODT) 4 MG disintegrating tablet DISSOLVE 1 TO 2 TABLETS(4 TO 8 MG) ON THE TONGUE EVERY 8 HOURS AS NEEDED FOR NAUSEA OR VOMITING    Oxcarbazepine (TRILEPTAL) 300 MG tablet TAKE 1 TABLET IN THE MORNING AND 1.5 TABLETS IN THE EVENING    Polyethyl Glycol-Propyl Glycol (SYSTANE) 0.4-0.3 % GEL ophthalmic gel Place 1 application into both eyes 2 (two) times daily as needed (dry eyes).    prochlorperazine (COMPAZINE) 10 MG tablet TAKE 1 TABLET(10 MG) BY MOUTH EVERY 6 HOURS AS NEEDED FOR NAUSEA OR VOMITING (Patient taking differently: Take 10 mg by mouth every 6 (six) hours as needed for nausea or vomiting.)    sucralfate (CARAFATE) 1 GM/10ML suspension Take 10 mLs (1 g total) by mouth 4 (four) times daily -  with meals and  at bedtime.    No facility-administered encounter medications on file as of 12/14/2021.  Notes: Call to patient per MP to reschedule follow up call due to training conflict in schedule. Patient aware and in agreement with date/time change.  Care Gaps: Bp- 120/64 11/19/21 AWV- 10/22 CCM-9/23  Star Rating Drugs: Benazepril (Lotensin) 20 mg - Last filled 09/21/21 90 DS at  [facility]  Pharmacist Assistant [phone]

## 2021-12-16 DIAGNOSIS — C159 Malignant neoplasm of esophagus, unspecified: Secondary | ICD-10-CM | POA: Diagnosis not present

## 2021-12-16 DIAGNOSIS — R1310 Dysphagia, unspecified: Secondary | ICD-10-CM | POA: Diagnosis not present

## 2021-12-17 ENCOUNTER — Inpatient Hospital Stay: Payer: Medicare Other

## 2021-12-17 ENCOUNTER — Encounter: Payer: Self-pay | Admitting: Hematology

## 2021-12-17 ENCOUNTER — Inpatient Hospital Stay: Payer: Medicare Other | Admitting: Dietician

## 2021-12-17 ENCOUNTER — Other Ambulatory Visit: Payer: Self-pay

## 2021-12-17 ENCOUNTER — Inpatient Hospital Stay: Payer: Medicare Other | Admitting: Hematology

## 2021-12-17 ENCOUNTER — Inpatient Hospital Stay: Payer: Medicare Other | Attending: Nurse Practitioner

## 2021-12-17 VITALS — BP 148/70 | HR 63 | Temp 97.8°F | Resp 19 | Wt 134.1 lb

## 2021-12-17 DIAGNOSIS — Z5112 Encounter for antineoplastic immunotherapy: Secondary | ICD-10-CM | POA: Diagnosis not present

## 2021-12-17 DIAGNOSIS — Z79899 Other long term (current) drug therapy: Secondary | ICD-10-CM | POA: Insufficient documentation

## 2021-12-17 DIAGNOSIS — C153 Malignant neoplasm of upper third of esophagus: Secondary | ICD-10-CM

## 2021-12-17 LAB — CMP (CANCER CENTER ONLY)
ALT: 13 U/L (ref 0–44)
AST: 18 U/L (ref 15–41)
Albumin: 4 g/dL (ref 3.5–5.0)
Alkaline Phosphatase: 101 U/L (ref 38–126)
Anion gap: 6 (ref 5–15)
BUN: 26 mg/dL — ABNORMAL HIGH (ref 8–23)
CO2: 30 mmol/L (ref 22–32)
Calcium: 9.8 mg/dL (ref 8.9–10.3)
Chloride: 105 mmol/L (ref 98–111)
Creatinine: 0.96 mg/dL (ref 0.44–1.00)
GFR, Estimated: 59 mL/min — ABNORMAL LOW (ref >60)
Glucose, Bld: 123 mg/dL — ABNORMAL HIGH (ref 70–99)
Potassium: 4.3 mmol/L (ref 3.5–5.1)
Sodium: 141 mmol/L (ref 135–145)
Total Bilirubin: 0.5 mg/dL (ref 0.3–1.2)
Total Protein: 6.7 g/dL (ref 6.5–8.1)

## 2021-12-17 LAB — CBC WITH DIFFERENTIAL (CANCER CENTER ONLY)
Abs Immature Granulocytes: 0.01 10*3/uL (ref 0.00–0.07)
Basophils Absolute: 0 10*3/uL (ref 0.0–0.1)
Basophils Relative: 1 %
Eosinophils Absolute: 0.2 10*3/uL (ref 0.0–0.5)
Eosinophils Relative: 4 %
HCT: 43.1 % (ref 36.0–46.0)
Hemoglobin: 14 g/dL (ref 12.0–15.0)
Immature Granulocytes: 0 %
Lymphocytes Relative: 10 %
Lymphs Abs: 0.5 10*3/uL — ABNORMAL LOW (ref 0.7–4.0)
MCH: 29 pg (ref 26.0–34.0)
MCHC: 32.5 g/dL (ref 30.0–36.0)
MCV: 89.4 fL (ref 80.0–100.0)
Monocytes Absolute: 0.4 10*3/uL (ref 0.1–1.0)
Monocytes Relative: 9 %
Neutro Abs: 3.5 10*3/uL (ref 1.7–7.7)
Neutrophils Relative %: 76 %
Platelet Count: 172 10*3/uL (ref 150–400)
RBC: 4.82 MIL/uL (ref 3.87–5.11)
RDW: 14.1 % (ref 11.5–15.5)
WBC Count: 4.6 10*3/uL (ref 4.0–10.5)
nRBC: 0 % (ref 0.0–0.2)

## 2021-12-17 LAB — TSH: TSH: 1.296 u[IU]/mL (ref 0.350–4.500)

## 2021-12-17 MED ORDER — SODIUM CHLORIDE 0.9 % IV SOLN
480.0000 mg | Freq: Once | INTRAVENOUS | Status: AC
  Administered 2021-12-17: 480 mg via INTRAVENOUS
  Filled 2021-12-17: qty 48

## 2021-12-17 MED ORDER — SODIUM CHLORIDE 0.9 % IV SOLN: Freq: Once | INTRAVENOUS | Status: AC

## 2021-12-17 NOTE — Progress Notes (Signed)
[place]   [phone] [phone]   Clinic Follow up Note   Patient Care Team: [name], [name], MD as PCP - General [name], MD as PCP - Cardiology (Cardiology) Cindie Crumbly (Neurosurgery) Irine Seal, MD as Attending Physician (Urology) Erline Levine, MD as Consulting Physician (Neurosurgery) [name], MD as Consulting Physician (Neurology) Viona Gilmore, [facility] as Pharmacist (Pharmacist) [name], MD as Consulting Physician (Gastroenterology) Carol Ada, MD as Consulting Physician (Gastroenterology) [name], MD (Inactive) as Consulting Physician (Otolaryngology) [name], MD as Consulting Physician (Hematology) Kyung Rudd, MD as Consulting Physician (Radiation Oncology) Kyung Rudd, MD as Consulting Physician (Radiation Oncology)  Date of Service:  12/17/2021  CHIEF COMPLAINT: f/u of esophageal cancer  CURRENT THERAPY:  Nivolumab, started 05/21/21, currently q4weeks  ASSESSMENT & PLAN:  [name] is a 81 y.o. female with   1. Squamous cell carcinoma of cervical esophagus, cTxN2M0 -presented with dysphagia and weight loss. EGD 9/15 and CT neck 02/05/21 showed 6 cm mass in the cervical/upper esophagus with possible local adenopathy. 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  -due to the location in the cervical esophagus, Dr. Kipp Brood feels she is not a surgical candidate.  -she received concurrent chemoRT with carbo/taxol 03/08/21 - 04/14/21. She required dose reduction with last cycle due to radiation dermatitis, hoarseness, dysphagia, and odynophagia but was able to complete treatment as scheduled. She had good response with no residual evidence of disease on scan or EGD. -given PDL1+, she began immunotherapy with  Nivolumab on 05/21/21. Will plan to give this for a total of a year if NED. -last EGD with esophageal dilation on 12/07/21 with Dr. Carlean Purl. -restaging CT CAP on 12/14/21 showed stable esophageal wall thickening without evidence of recurrent or metastatic disease. We discussed that she has no evidence of cancer on EGD and scan now, but still has risk of recurrence, especially in next 2-3 years  -labs reviewed, overall stable and adequate to proceed with Nivo today. Given good tolerance and stability, I will see her every other treatment. Plan to complete one year treatment in Dec 2023   2. Dysphagia, weight loss, cough -Secondary to #1 -She underwent G-tube placement by Dr. Harlow Asa on 02/22/21 -neck CT 06/18/21 showed decrease in size of esophageal mass -she continues esophageal dilation under Dr. Carlean Purl, most recently on 12/07/21 -she was found to have laryngeal edema on EGD from 12/07/21. She is being referred to ENT by Dr. Carlean Purl. -she reports she is mostly eating by mouth, only using the feeding tube occasionally. I discussed we can remove if she does not need to use it for 2 months.   3. 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 Trigeminal neuralgia, sciatica, headaches -On lamotrigene and oxcarbazepine  -pain has worsened before and during treatment -f/up Dr. Jaynee Eagles    5. HTN -continue benazepril, furosemide -monitoring closely    6. Goals of Care/Code Status -Code status was DNR from hospitalization 05/16/20, changed to full code during 02/26/21 hospitalization for feeding tube placement       PLAN: -lab and scan reviewed -proceed with Nivo today and continue every 4 weeks -f/u in 8 weeks, OK to remove PEG after next visit if she does not need it for nutrition in next two months  No problem-specific Assessment & Plan notes found for this encounter.   SUMMARY  OF ONCOLOGIC HISTORY: Oncology History  Malignant neoplasm of upper third esophagus ([place])  12/21/2020 Imaging   Laryngoscopy Comments:    On fiberoptic laryngoscopy through the right nostril the nasopharynx was clear.  The base of tongue vallecula and epiglottis were normal.    Piriform sinuses were clear bilaterally and vocal cords were clear with  normal vocal mobility.  No structural abnormalities noted.   01/22/2021 Imaging   DG esophagus IMPRESSION: 1. Luminal narrowing in the high cervical esophagus over a 3 cm segment is highly concerning for esophageal neoplasm. Inflammatory process would be a secondary consideration. Luminal narrowing occurs approximately at the C5-C7 vertebral body level just below the glottis. Patient experienced several episodes of choking related to this luminal narrowing. Recommend expedient upper GI endoscopy for evaluation. 2. Distal thoracic esophagus and GE junction appear normal.   02/04/2021 Procedure   EGD by Dr. Carlean Purl impression- One mass-like severe stenosis was found in the upper third of the esophagus. The stenosis was not traversed.   02/05/2021 Imaging   CT soft tissue neck w contrast IMPRESSION: Mass-like soft tissue thickening and mucosal hyperenhancement of the cervical esophagus and hypopharynx, spanning the C4-T1 levels, likely reflecting an esophageal malignancy. This measures up to 2.4 x 3.4 cm in transaxial dimensions, and 6.3 cm in craniocaudal dimension. Associated severe effacement of the esophageal lumen.   Centrally necrotic right paratracheal lymph node beneath the level of the mass, measuring 1.3 x 0.9 cm, and likely reflecting a site of nodal metastatic disease.   Additional lymph nodes along the posterior and inferior aspect of the right thyroid lobe (along the right aspect of the esophageal mass), which measure subcentimeter but are asymmetrically prominent and highly suspicious for additional sites of nodal metastatic disease.    02/12/2021 Pathology Results   FINAL MICROSCOPIC DIAGNOSIS:  A. ESOPHAGUS, UPPER CERVICAL #1, BIOPSY:  - Squamous cell carcinoma.  B. ESOPHAGUS, UPPER CERVICAL #2, BIOPSY:  - Squamous cell carcinoma.    02/12/2021 Cancer Staging   Staging form: Esophagus - Squamous Cell Carcinoma, AJCC 8th Edition - Clinical stage from 02/12/2021: Stage Unknown (cTX, cN2, cM0) - Signed by [name], MD on 03/08/2021 Stage prefix: Initial diagnosis   02/18/2021 Initial Diagnosis   Malignant neoplasm of upper third esophagus ([place])   02/23/2021 PET scan   IMPRESSION: Markedly hypermetabolic focal masslike thickening of the cervical esophagus, compatible with primary esophageal malignancy.   Markedly hypermetabolic right upper paratracheal lymph node, compatible with metastatic disease.   Small right upper paratracheal and right level IIA lymph nodes with mild hypermetabolic activity, concerning for additional sites of nodal metastatic disease.   Aortic Atherosclerosis (ICD10-I70.0).   03/09/2021 - 04/13/2021 Chemotherapy   Patient is on Treatment Plan : ESOPHAGUS Carboplatin/PACLitaxel weekly x 6 weeks with XRT       05/21/2021 -  Chemotherapy   Patient is on Treatment Plan : GASTROESOPHAGEAL Nivolumab q14d x 8 cycles / Nivolumab q28d        INTERVAL HISTORY:  [name] is here for a follow up of esophageal cancer. She was last seen by me on 11/19/21. She presents to the clinic accompanied by her husband. She reports she is doing well overall. She explains she is eating most everything but notes she does require a drink/liquid to get it down and ensure she doesn't choke. They explain she is not using the feeding tube very often, last maybe a few  days to a week ago.   All other systems were reviewed with the patient and are negative.  MEDICAL HISTORY:  Past Medical History:  Diagnosis Date   Abdominal pain 05/29/2013   s/p rx of cephalo resistant e coli   but last rx NG  now  residular ?  bladder sx repeat cx sx rx to ty and uro consult    ADJ DISORDER WITH MIXED ANXIETY & DEPRESSED MOOD 03/03/2010   Qualifier: Diagnosis of  By: [name], [name]    Agent resistant to multiple antibiotics 05/29/2013   e coli   bu NG on fu.     Anemia    Anxiety    ARF (acute renal failure) (HCC) 03/12/2015   Closed head injury 02/01/2011   from syncope and had scalp laceration  neg ct .     Closed head injury 5-6 yrs ago   Colitis 73/42/8768   Complication of anesthesia    migraine several hours after general anesthesia   Depression    esophageal ca 01/2021   Fatty liver    Gall stones 2016   see ct scan neg HIDA    GERD (gastroesophageal reflux disease)    Hearing aid worn    HOH (hard of hearing)    both ears   Hyperlipidemia    Hypertension    echo nl lv function  mild dilitation 2009   Kidney infection    few yrs ago in hospital   Medication side effect 09/02/2010   Poss muscle se of 10 crestor    Migraine    hypnic HA eval by Dr. 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    Pyelonephritis 03/12/2015   Sensation of pain in anesthetized distribution of trigeminal nerve    Syncope 02/01/2011   In shower on vacation  sustained head laceration  8 sutures Had ed visit neg head ct labs and x ray    Trigeminal neuralgia pain     SURGICAL HISTORY: Past Surgical History:  Procedure Laterality Date   ABDOMINAL HYSTERECTOMY  2002   tubal   BACK SURGERY     2 times, for sciatic nerve pain   BALLOON DILATION  06/21/2021   Procedure: DILATION BALLOON used for esophageal stricture;  Surgeon: Irving Copas., MD;  Location: [facility];  Service: Gastroenterology;;   CARDIAC CATHETERIZATION  2000   chest pains neg   CHOLECYSTECTOMY N/A 02/21/2017   Procedure: LAPAROSCOPIC CHOLECYSTECTOMY WITH INTRAOPERATIVE CHOLANGIOGRAM;  Surgeon: Armandina Gemma, MD;  Location: [facility];  Service: General;  Laterality: N/A;   COLONOSCOPY      multiple   CRANIOTOMY  12/09/2011   nerve decompression right trigeminal    DIRECT LARYNGOSCOPY N/A 02/12/2021   Procedure: DIRECT LARYNGOSCOPY AND BIOPSY POSSIBLE FROZEN;  Surgeon: [name], MD;  Location: [facility];  Service: ENT;  Laterality: N/A;   DOPPLER ECHOCARDIOGRAPHY  2009   nl lv function mild lv dilitation   ESOPHAGOGASTRODUODENOSCOPY (EGD) WITH PROPOFOL N/A 06/21/2021   Procedure: ESOPHAGOGASTRODUODENOSCOPY (EGD) WITH PROPOFOL;  Surgeon: Irving Copas., MD;  Location: [facility];  Service: Gastroenterology;  Laterality: N/A;  Request Fluoroscopy; Plan in for dilation   ESOPHAGOGASTRODUODENOSCOPY (EGD) WITH PROPOFOL N/A 07/19/2021   Procedure: ESOPHAGOGASTRODUODENOSCOPY (EGD) WITH PROPOFOL;  Surgeon: Rush Landmark Telford Nab., MD;  Location: [facility];  Service: Endoscopy;  Laterality: N/A;  Dilation fluoro   ESOPHAGOGASTRODUODENOSCOPY (EGD) WITH PROPOFOL N/A 08/05/2021   Procedure: ESOPHAGOGASTRODUODENOSCOPY (  EGD) WITH PROPOFOL - fluoro;  Surgeon: Mansouraty, Telford Nab., MD;  Location: [facility];  Service: Gastroenterology;  Laterality: N/A;   ESOPHAGOGASTRODUODENOSCOPY (EGD) WITH PROPOFOL N/A 08/23/2021   Procedure: ESOPHAGOGASTRODUODENOSCOPY (EGD) WITH PROPOFOL;  Surgeon: [name], MD;  [facility];  Service: Gastroenterology;  Laterality: N/A;  Using Fluoroscopy and esophageal dilation   ESOPHAGOGASTRODUODENOSCOPY (EGD) WITH PROPOFOL N/A 09/22/2021   Procedure: ESOPHAGOGASTRODUODENOSCOPY (EGD) WITH PROPOFOL;  Surgeon: [name], MD;  [facility];  Service: Gastroenterology;  Laterality: N/A;  EGD with savary and flour   ESOPHAGOGASTRODUODENOSCOPY (EGD) WITH PROPOFOL N/A 12/07/2021   Procedure: ESOPHAGOGASTRODUODENOSCOPY (EGD) WITH PROPOFOL;  Surgeon: [name], MD;  [facility];  Service: Gastroenterology;  Laterality: N/A;  Schedule this with Fluroscopy, it is a savary dilation   EYE SURGERY  Bilateral    ioc for catatracts   GASTROSTOMY N/A 02/26/2021   Procedure: OPEN GASTROSTOMY TUBE PLACEMENT;  Surgeon: Armandina Gemma, MD;  Location: [facility];  Service: General;  Laterality: N/A;   IR GASTROSTOMY TUBE MOD SED  02/22/2021   IR REPLACE G-TUBE SIMPLE WO FLUORO  09/07/2021   laparoscopic gallbladder surgery  02/16/2017   Fax from [facility] Bilateral 2002   rt shoulder surgery     SAVORY DILATION N/A 06/21/2021   Procedure: SAVORY DILATION;  Surgeon: Irving Copas., MD;  Location: [facility];  Service: Gastroenterology;  Laterality: N/A;   SAVORY DILATION N/A 07/19/2021   Procedure: SAVORY DILATION;  Surgeon: Rush Landmark Telford Nab., MD;  Location: [facility];  Service: Endoscopy;  Laterality: N/A;   SAVORY DILATION N/A 08/05/2021   Procedure: SAVORY DILATION;  Surgeon: Rush Landmark Telford Nab., MD;  Location: [facility];  Service: Gastroenterology;  Laterality: N/A;   SAVORY DILATION N/A 08/23/2021   Procedure: SAVORY DILATION;  Surgeon: [name], MD;  [facility];  Service: Gastroenterology;  Laterality: N/A;   SAVORY DILATION N/A 09/22/2021   Procedure: SAVORY DILATION;  Surgeon: [name], MD;  [facility];  Service: Gastroenterology;  Laterality: N/A;   SAVORY DILATION N/A 12/07/2021   Procedure: SAVORY DILATION;  Surgeon: [name], MD;  [facility];  Service: Gastroenterology;  Laterality: N/A;    I have reviewed the social history and family history with the patient and they are unchanged from previous note.  ALLERGIES:  is allergic to bactrim [sulfamethoxazole-trimethoprim] and hydrocodone.  MEDICATIONS:  Current Outpatient Medications  Medication Sig Dispense Refill   benazepril (LOTENSIN) 20 MG tablet TAKE 1 TABLET(20 MG) BY MOUTH TWICE DAILY (Patient taking differently: Place 20 mg into feeding tube daily.) 180 tablet 0   carvedilol (COREG) 25 MG tablet TAKE 1 TABLET(25 MG) BY MOUTH  TWICE DAILY WITH A MEAL 60 tablet 3   conjugated estrogens (PREMARIN) vaginal cream Place 1 applicator vaginally once a week.     fluticasone (FLONASE) 50 MCG/ACT nasal spray Place 2 sprays into both nostrils as needed for allergies or rhinitis. 16 g 5   furosemide (LASIX) 20 MG tablet TAKE 1 TABLET(20 MG) BY MOUTH DAILY 90 tablet 0   guaiFENesin-codeine 100-10 MG/5ML syrup TAKE 5 ML BY MOUTH EVERY 6 HOURS AS NEEDED FOR COUGH (Patient taking differently: Place 5 mLs into feeding tube 2 (two) times daily as needed for cough.) 240 mL 0   lamoTRIgine (LAMICTAL) 100 MG tablet Place 250 mg into feeding tube 2 (two) times daily. For pain     loperamide (IMODIUM) 2 MG capsule 2 mg daily  as needed for diarrhea or loose stools. Per tube     Multiple Minerals-Vitamins (CAL-MAG-ZINC-D PO) Place 1 tablet into feeding tube 2 (two) times daily.     Multiple Vitamin (MULTIVITAMIN WITH MINERALS) TABS tablet Place 1 tablet into feeding tube in the morning.     Nutritional Supplements (NUTREN 1.5) LIQD Place 237 mLs into feeding tube in the morning and at bedtime.     omeprazole (PRILOSEC) 20 MG capsule 20 mg at bedtime. Per tube     ondansetron (ZOFRAN-ODT) 4 MG disintegrating tablet DISSOLVE 1 TO 2 TABLETS(4 TO 8 MG) ON THE TONGUE EVERY 8 HOURS AS NEEDED FOR NAUSEA OR VOMITING 30 tablet 1   Oxcarbazepine (TRILEPTAL) 300 MG tablet TAKE 1 TABLET IN THE MORNING AND 1.5 TABLETS IN THE EVENING (Patient taking differently: Place 300 mg into feeding tube. Take 0.5 Tablet(150 mg) in the morning and 1 tablet (300 mg) in the evening.) 75 tablet 3   Polyethyl Glycol-Propyl Glycol (SYSTANE) 0.4-0.3 % GEL ophthalmic gel Place 1 application into both eyes 2 (two) times daily as needed (dry eyes).     prochlorperazine (COMPAZINE) 10 MG tablet TAKE 1 TABLET(10 MG) BY MOUTH EVERY 6 HOURS AS NEEDED FOR NAUSEA OR VOMITING (Patient taking differently: Take 10 mg by mouth every 6 (six) hours as needed for nausea or vomiting.) 30 tablet 1    sucralfate (CARAFATE) 1 GM/10ML suspension Take 10 mLs (1 g total) by mouth 4 (four) times daily -  with meals and at bedtime. 420 mL 5   No current facility-administered medications for this visit.    PHYSICAL EXAMINATION: ECOG PERFORMANCE STATUS: 2 - Symptomatic, <50% confined to bed  Vitals:   12/17/21 1000  BP: (!) 148/70  Pulse: 63  Resp: 19  Temp: 97.8 F (36.6 C)  SpO2: 96%   Wt Readings from Last 3 Encounters:  12/17/21 134 lb 2 oz (60.8 kg)  12/07/21 135 lb (61.2 kg)  11/19/21 132 lb 11.2 oz (60.2 kg)     GENERAL:alert, no distress and comfortable SKIN: skin color normal, no rashes or significant lesions EYES: normal, Conjunctiva are pink and non-injected, sclera clear  NEURO: alert & oriented x 3 with fluent speech  LABORATORY DATA:  I have reviewed the data as listed    Latest Ref Rng & Units 12/17/2021    9:27 AM 11/19/2021    9:19 AM 10/22/2021    9:56 AM  CBC  WBC 4.0 - 10.5 K/uL 4.6  4.9  5.1   Hemoglobin 12.0 - 15.0 g/dL 14.0  13.3  13.1   Hematocrit 36.0 - 46.0 % 43.1  40.6  39.5   Platelets 150 - 400 K/uL 172  188  160         Latest Ref Rng & Units 12/17/2021    9:27 AM 11/19/2021    9:19 AM 10/22/2021    9:56 AM  CMP  Glucose 70 - 99 mg/dL 123  112  163   BUN 8 - 23 mg/dL _0 Creatinine 0.44 - 1.00 mg/dL 0.96  0.88  0.85   Sodium 135 - 145 mmol/L 141  139  137   Potassium 3.5 - 5.1 mmol/L 4.3  4.0  4.0   Chloride 98 - 111 mmol/L 105  103  103   CO2 22 - 32 mmol/L _1 Calcium 8.9 - 10.3 mg/dL 9.8  9.9  9.7   Total Protein 6.5 - 8.1 g/dL  6.7  6.6  6.0   Total Bilirubin 0.3 - 1.2 mg/dL 0.5  0.6  0.8   Alkaline Phos 38 - 126 U/L 101  100  94   AST 15 - 41 U/L _0 ALT 0 - 44 U/L _1 RADIOGRAPHIC STUDIES: I have personally reviewed the radiological images as listed and agreed with the findings in the report. No results found.    No orders of the defined types were placed in this encounter.  All  questions were answered. The patient knows to call the clinic with any problems, questions or concerns. No barriers to learning was detected. The total time spent in the appointment was 30 minutes.     [name], MD 12/17/2021   I, Wilburn Mylar, am acting as scribe for [name], MD.   I have reviewed the above documentation for accuracy and completeness, and I agree with the above.

## 2021-12-17 NOTE — Progress Notes (Deleted)
Nutrition Follow-up:  Patient completed concurrent chemoradiation for esophageal cancer. She is currently receiving Nivolumab q4 weeks, started 05/21/21.  7/18 - EGD savory dilation under the care of Dr. Carlean Purl   Medications: lasix 20 mg/day, zofran-odt  Labs: glucose 123, BUN 26  Anthropometrics: Weight 134 lb 2 oz today      NUTRITION DIAGNOSIS: Inadequate oral intake      INTERVENTION: ***    MONITORING, EVALUATION, GOAL: weight trends, oral intake, tube feeding   NEXT VISIT: ***

## 2021-12-17 NOTE — Patient Instructions (Signed)
[facility]  Discharge Instructions: Thank you for choosing [facility] to provide your oncology and hematology care.   If you have a lab appointment with the [facility], please go directly to the [facility] and check in at the registration area.   Wear comfortable clothing and clothing appropriate for easy access to any Portacath or PICC line.   We strive to give you quality time with your provider. You may need to reschedule your appointment if you arrive late (15 or more minutes).  Arriving late affects you and other patients whose appointments are after yours.  Also, if you miss three or more appointments without notifying the office, you may be dismissed from the clinic at the provider's discretion.      For prescription refill requests, have your pharmacy contact our office and allow 72 hours for refills to be completed.    Today you received the following chemotherapy and/or immunotherapy agents: nivolumab (opdivo)      To help prevent nausea and vomiting after your treatment, we encourage you to take your nausea medication as directed.  BELOW ARE SYMPTOMS THAT SHOULD BE REPORTED IMMEDIATELY: *FEVER GREATER THAN 100.4 F (38 C) OR HIGHER *CHILLS OR SWEATING *NAUSEA AND VOMITING THAT IS NOT CONTROLLED WITH YOUR NAUSEA MEDICATION *UNUSUAL SHORTNESS OF BREATH *UNUSUAL BRUISING OR BLEEDING *URINARY PROBLEMS (pain or burning when urinating, or frequent urination) *BOWEL PROBLEMS (unusual diarrhea, constipation, pain near the anus) TENDERNESS IN MOUTH AND THROAT WITH OR WITHOUT PRESENCE OF ULCERS (sore throat, sores in mouth, or a toothache) UNUSUAL RASH, SWELLING OR PAIN  UNUSUAL VAGINAL DISCHARGE OR ITCHING   Items with * indicate a potential emergency and should be followed up as soon as possible or go to the Emergency Department if any problems should occur.  Please show the CHEMOTHERAPY ALERT CARD or IMMUNOTHERAPY ALERT CARD at  check-in to the Emergency Department and triage nurse.  Should you have questions after your visit or need to cancel or reschedule your appointment, please contact [facility]  Dept: [phone]  and follow the prompts.  Office hours are 8:00 a.m. to 4:30 p.m. Monday - Friday. Please note that voicemails left after 4:00 p.m. may not be returned until the following business day.  We are closed weekends and major holidays. You have access to a nurse at all times for urgent questions. Please call the main number to the clinic Dept: [phone] and follow the prompts.   For any non-urgent questions, you may also contact your provider using MyChart. We now offer e-Visits for anyone 85 and older to request care online for non-urgent symptoms. For details visit [email or web address].   Also download the MyChart app! Go to the app store, search "MyChart", open the app, select [facility], and log in with your MyChart username and password.  Masks are optional in the cancer centers. If you would like for your care team to wear a mask while they are taking care of you, please let them know. For doctor visits, patients may have with them one support person who is at least 82 years old. At this time, visitors are not allowed in the infusion area.

## 2021-12-18 LAB — T4: T4, Total: 5.7 ug/dL (ref 4.5–12.0)

## 2021-12-20 ENCOUNTER — Other Ambulatory Visit: Payer: Self-pay | Admitting: Neurology

## 2021-12-21 ENCOUNTER — Telehealth: Payer: Self-pay | Admitting: Hematology

## 2021-12-21 ENCOUNTER — Other Ambulatory Visit: Payer: Self-pay

## 2021-12-21 NOTE — Telephone Encounter (Signed)
Scheduled follow-up appointments per 7/28 los. Patient is aware.

## 2021-12-22 ENCOUNTER — Telehealth: Payer: Self-pay | Admitting: Adult Health

## 2021-12-22 ENCOUNTER — Encounter: Payer: Self-pay | Admitting: Neurology

## 2021-12-22 NOTE — Telephone Encounter (Signed)
Refill sent to pharmacy for 250 mg twice daily per last office note.

## 2021-12-22 NOTE — Telephone Encounter (Signed)
Error

## 2021-12-22 NOTE — Telephone Encounter (Signed)
Pt is calling back to check on refill lamoTRIgine (LAMICTAL) 100 MG tablet. Pt called on 7/31 for refill but haven't been sent to the pharmacy as of yet. She also stated that she only have one pill left.  Pt is requesting prescription be sent to [facility] #[id number]

## 2021-12-23 ENCOUNTER — Other Ambulatory Visit: Payer: Self-pay | Admitting: Internal Medicine

## 2021-12-23 DIAGNOSIS — H43811 Vitreous degeneration, right eye: Secondary | ICD-10-CM | POA: Diagnosis not present

## 2021-12-27 ENCOUNTER — Telehealth: Payer: Self-pay

## 2021-12-27 ENCOUNTER — Ambulatory Visit (INDEPENDENT_AMBULATORY_CARE_PROVIDER_SITE_OTHER): Payer: Medicare Other | Admitting: Family Medicine

## 2021-12-27 VITALS — BP 178/92 | HR 56 | Temp 98.0°F | Wt 131.6 lb

## 2021-12-27 DIAGNOSIS — R8290 Unspecified abnormal findings in urine: Secondary | ICD-10-CM

## 2021-12-27 DIAGNOSIS — I10 Essential (primary) hypertension: Secondary | ICD-10-CM

## 2021-12-27 LAB — POCT URINALYSIS DIPSTICK
Bilirubin, UA: NEGATIVE
Blood, UA: NEGATIVE
Glucose, UA: NEGATIVE
Ketones, UA: NEGATIVE
Nitrite, UA: NEGATIVE
Protein, UA: NEGATIVE
Spec Grav, UA: 1.02 (ref 1.010–1.025)
Urobilinogen, UA: NEGATIVE E.U./dL — AB
pH, UA: 6 (ref 5.0–8.0)

## 2021-12-27 MED ORDER — AMOXICILLIN 500 MG PO TABS: 500.0000 mg | ORAL_TABLET | Freq: Two times a day (BID) | ORAL | 0 refills | Status: AC

## 2021-12-27 NOTE — Progress Notes (Signed)
Subjective:    Patient ID: [name], female    DOB: 01-29-1941, 81 y.o.   MRN: [id number]  Chief Complaint  Patient presents with   Hypertension    BP has been up -running 200/100s and then will drop down in the afternoon. This morning 192/74, 147/81 around noon today      HPI Patient was seen today for acute concern of BP elevation.  Patient followed by Dr. Regis Bill and cardiology.  BP 200/100 then decreases in afternoon.  BP was 192/74 around 9 am and 147/81 at lunch.  Pt taking benazepril 20 mg BID, coreg 25 mg BID, and lasix 20 mg daily.  Pt typically with low normal bp 90s/60, recently 140s at oncology appt.  Pt denies HAs, CP, changes in vision.  Pt with cloudy urine. Denies dysuria.  Was getting 75 ml 5 x a day with tube feeds, but now eating po.  Not drinking much water.  Had pepperoni pizza last wk.   Past Medical History:  Diagnosis Date   Abdominal pain 05/29/2013   s/p rx of cephalo resistant e coli   but last rx NG  now residular ?  bladder sx repeat cx sx rx to ty and uro consult    ADJ DISORDER WITH MIXED ANXIETY & DEPRESSED MOOD 03/03/2010   Qualifier: Diagnosis of  By: [name], [name]    Agent resistant to multiple antibiotics 05/29/2013   e coli   bu NG on fu.     Anemia    Anxiety    ARF (acute renal failure) (HCC) 03/12/2015   Closed head injury 02/01/2011   from syncope and had scalp laceration  neg ct .     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    Pyelonephritis 03/12/2015   Sensation of pain in anesthetized distribution of trigeminal nerve    Syncope 02/01/2011   In shower on vacation  sustained head laceration  8 sutures Had ed visit neg head ct labs and x ray    Trigeminal neuralgia pain     Allergies  Allergen Reactions   Bactrim [Sulfamethoxazole-Trimethoprim] Hives and Itching   Hydrocodone Other (See Comments)    Rebound headaches    ROS General: Denies fever, chills, night sweats, changes in weight, changes in appetite HEENT: Denies headaches, ear pain, changes in vision, rhinorrhea, sore throat CV: Denies CP, palpitations, SOB, orthopnea Pulm: Denies SOB, cough, wheezing GI: Denies abdominal pain, nausea, vomiting, diarrhea, constipation GU: Denies dysuria, hematuria, frequency, vaginal discharge + cloudy urine Msk: Denies muscle cramps, joint pains Neuro: Denies weakness, numbness, tingling Skin: Denies rashes, bruising Psych: Denies depression, anxiety, hallucinations     Objective:  Blood pressure (!) 178/92, pulse (!) 56, temperature 98 F (36.7 C), temperature source Oral, weight 131 lb 9.6 oz (59.7 kg), SpO2 95 %.  Gen. Pleasant, well-nourished, in no distress, normal affect   HEENT: [place]/AT, face symmetric, conjunctiva clear, no scleral icterus, PERRLA, EOMI, nares patent without drainage Neck: No JVD, no thyromegaly, no carotid bruits Lungs: no accessory muscle use, CTAB, no wheezes or rales Cardiovascular: RRR, no m/r/g, no peripheral edema Musculoskeletal: No deformities, no cyanosis or clubbing, normal tone Neuro:  A&Ox3, CN II-XII intact, normal gait Skin:  Warm, no lesions/ rash   Wt Readings from Last 3 Encounters:  12/27/21 131 lb 9.6 oz (59.7 kg)  12/17/21 134 lb 2 oz (60.8 kg)  12/07/21 135 lb (61.2 kg)    Lab Results  Component Value Date   WBC 4.6 12/17/2021   HGB 14.0 12/17/2021   HCT  43.1 12/17/2021   PLT 172 12/17/2021   GLUCOSE 123 (H) 12/17/2021   CHOL 279 (H) 05/27/2019   TRIG 78.0 05/27/2019   HDL 118.80 05/27/2019   LDLDIRECT 135.9 10/16/2012   LDLCALC 145 (H) 05/27/2019   ALT 13 12/17/2021   AST 18 12/17/2021   NA 141 12/17/2021   K 4.3 12/17/2021   CL 105 12/17/2021   CREATININE 0.96 12/17/2021   BUN 26 (H) 12/17/2021   CO2 30 12/17/2021   TSH 1.296 12/17/2021   INR 1.1 02/22/2021   HGBA1C 5.2 11/09/2021    Assessment/Plan:  Essential hypertension -Uncontrolled -Likely 2/2 recent increased sodium intake and decreased water intake as using feeding tube less and eating p.o. -Lifestyle modifications -Continue current medications -We will have patient follow-up in the next week or 2 with PCP for BP check  - Plan: POCT urinalysis dipstick, Basic metabolic panel, TSH  Cloudy urine  -UA with SG 1.020, 3+ leuks -We will send urine for urine culture -Start amoxicillin while awaiting culture results. - Plan: amoxicillin (AMOXIL) 500 MG tablet, Culture, Urine, POCT urinalysis dipstick  F/u as needed in the next 1-2 weeks with PCP  Grier Mitts, MD

## 2021-12-27 NOTE — Telephone Encounter (Addendum)
---  Caller states her blood pressure is 188/91. Caller states her BP last night was 212/108, 199/104. Caller states she is currently on BP meds. Caller states she had a bad headache yesterday. Caller denies shortness of breath. Caller denies any symptoms at this time.  12/27/2021 9:14:30 AM See HCP within 4 Hours (or PCP triage) Burnadette Peter, RN, Elmo Putt  Comments User: Braxton Feathers, RN Date/Time Eilene Ghazi Time): 12/27/2021 9:12:43 AM Caller states her tongue and the side of her face when the trigeminal issue is located is starting to feel fuzzy. Caller states this started a few days ago. Referrals  REFERRED TO PCP OFFICE Warm transfer to backline  Pt has appt with Dr Volanda Napoleon today at 2p

## 2021-12-28 ENCOUNTER — Encounter: Payer: Self-pay | Admitting: Hematology

## 2021-12-28 LAB — BASIC METABOLIC PANEL
BUN: 30 mg/dL — ABNORMAL HIGH (ref 6–23)
CO2: 34 mEq/L — ABNORMAL HIGH (ref 19–32)
Calcium: 10.4 mg/dL (ref 8.4–10.5)
Chloride: 105 mEq/L (ref 96–112)
Creatinine, Ser: 0.97 mg/dL (ref 0.40–1.20)
GFR: 54.44 mL/min — ABNORMAL LOW (ref >60.00)
Glucose, Bld: 86 mg/dL (ref 70–99)
Potassium: 4.4 mEq/L (ref 3.5–5.1)
Sodium: 142 mEq/L (ref 135–145)

## 2021-12-28 LAB — TSH: TSH: 1.97 u[IU]/mL (ref 0.35–5.50)

## 2021-12-28 LAB — URINE CULTURE
MICRO NUMBER:: 13744874
Result:: NO GROWTH
SPECIMEN QUALITY:: ADEQUATE

## 2021-12-30 ENCOUNTER — Other Ambulatory Visit: Payer: Self-pay | Admitting: Hematology

## 2021-12-30 ENCOUNTER — Telehealth: Payer: Self-pay | Admitting: Internal Medicine

## 2021-12-30 NOTE — Telephone Encounter (Signed)
Was the 8 AM blood pressure reading prior to pt taking her medication?

## 2021-12-30 NOTE — Telephone Encounter (Signed)
Pt is calling to report to dr banks that her bp today 12-30-2021 at 8 am bp 176/97 and 12 noon bp 138/84. Pt is not having any symptoms such as headaches. Pt seen dr banks on 12-27-2021

## 2022-01-04 ENCOUNTER — Telehealth: Payer: Self-pay | Admitting: Pharmacist

## 2022-01-04 NOTE — Progress Notes (Signed)
Chronic Care Management Pharmacy Assistant   Name: [name]  MRN: [id number] DOB: [birth year]  Reason for Encounter: Disease State   Conditions to be addressed/monitored: HTN  Recent office visits:  12/27/21 Angela Ruddy, MD - Patient presented for Essential Hypertension and other concerns. Prescribed Amoxicillin 500 mg.  Recent consult visits:  12/17/21 Patient presented to [facility] for Treatment.  12/17/21 Angela Merle, MD (Oncology) - Patient presented for Malignant neoplasm of upper third esophagus. Stopped Keppra.  Hospital visits:  Medication Reconciliation was completed by comparing discharge summary, patient's EMR and Pharmacy list, and upon discussion with patient.   Patient presented to [facility] on 12/07/21 due to EGD with Propofol. Patient was present for 3 hours.   New?Medications Started at [facility] Discharge:?? -started  none   Medication Changes at Hospital Discharge: -Changed  none   Medications Discontinued at Hospital Discharge: -Stopped  None   Medications that remain the same after Hospital Discharge:??  -All other medications will remain the same.    Medications: Outpatient Encounter Medications as of 01/04/2022  Medication Sig Note   benazepril (LOTENSIN) 20 MG tablet TAKE 1 TABLET(20 MG) BY MOUTH TWICE DAILY    carvedilol (COREG) 25 MG tablet TAKE 1 TABLET(25 MG) BY MOUTH TWICE DAILY WITH A MEAL    conjugated estrogens (PREMARIN) vaginal cream Place 1 applicator vaginally once a week.    fluticasone (FLONASE) 50 MCG/ACT nasal spray Place 2 sprays into both nostrils as needed for allergies or rhinitis. 09/22/2021: On hold while doing chemo treatments   furosemide (LASIX) 20 MG tablet TAKE 1 TABLET(20 MG) BY MOUTH DAILY    guaiFENesin-codeine 100-10 MG/5ML syrup Place 5 mLs into feeding tube 2 (two) times daily as needed for cough.    lamoTRIgine (LAMICTAL) 100 MG tablet Place 2.5 tablets (250  mg total) into feeding tube 2 (two) times daily.    loperamide (IMODIUM) 2 MG capsule 2 mg daily as needed for diarrhea or loose stools. Per tube    Multiple Minerals-Vitamins (CAL-MAG-ZINC-D PO) Place 1 tablet into feeding tube 2 (two) times daily.    Multiple Vitamin (MULTIVITAMIN WITH MINERALS) TABS tablet Place 1 tablet into feeding tube in the morning.    Nutritional Supplements (NUTREN 1.5) LIQD Place 237 mLs into feeding tube in the morning and at bedtime.    omeprazole (PRILOSEC) 20 MG capsule 20 mg at bedtime. Per tube    ondansetron (ZOFRAN-ODT) 4 MG disintegrating tablet DISSOLVE 1 TO 2 TABLETS(4 TO 8 MG) ON THE TONGUE EVERY 8 HOURS AS NEEDED FOR NAUSEA OR VOMITING    Oxcarbazepine (TRILEPTAL) 300 MG tablet TAKE 1 TABLET IN THE MORNING AND 1.5 TABLETS IN THE EVENING (Patient taking differently: Place 300 mg into feeding tube. Take 0.5 Tablet(150 mg) in the morning and 1 tablet (300 mg) in the evening.)    Polyethyl Glycol-Propyl Glycol (SYSTANE) 0.4-0.3 % GEL ophthalmic gel Place 1 application into both eyes 2 (two) times daily as needed (dry eyes).    prochlorperazine (COMPAZINE) 10 MG tablet TAKE 1 TABLET(10 MG) BY MOUTH EVERY 6 HOURS AS NEEDED FOR NAUSEA OR VOMITING (Patient taking differently: Take 10 mg by mouth every 6 (six) hours as needed for nausea or vomiting.)    sucralfate (CARAFATE) 1 GM/10ML suspension Take 10 mLs (1 g total) by mouth 4 (four) times daily -  with meals and at bedtime.    No facility-administered encounter medications on file as of 01/04/2022.   Reviewed  chart prior to disease state call. Spoke with patient regarding BP  Recent Office Vitals: BP Readings from Last 3 Encounters:  12/27/21 (!) 178/92  12/17/21 (!) 148/70  12/07/21 (!) 192/100   Pulse Readings from Last 3 Encounters:  12/27/21 (!) 56  12/17/21 63  12/07/21 60    Wt Readings from Last 3 Encounters:  12/27/21 131 lb 9.6 oz (59.7 kg)  12/17/21 134 lb 2 oz (60.8 kg)  12/07/21 135 lb  (61.2 kg)     Kidney Function Lab Results  Component Value Date/Time   CREATININE 0.97 12/27/2021 03:16 PM   CREATININE 0.96 12/17/2021 09:27 AM   CREATININE 0.88 11/19/2021 09:19 AM   GFR 54.44 (L) 12/27/2021 03:16 PM   GFRNONAA 59 (L) 12/17/2021 09:27 AM   GFRAA 62 07/08/2020 04:53 PM       Latest Ref Rng & Units 12/27/2021    3:16 PM 12/17/2021    9:27 AM 11/19/2021    9:19 AM  BMP  Glucose 70 - 99 mg/dL 86  123  112   BUN 6 - 23 mg/dL 30  26  28    Creatinine 0.40 - 1.20 mg/dL 0.97  0.96  0.88   Sodium 135 - 145 mEq/L 142  141  139   Potassium 3.5 - 5.1 mEq/L 4.4  4.3  4.0   Chloride 96 - 112 mEq/L 105  105  103   CO2 19 - 32 mEq/L 34  30  31   Calcium 8.4 - 10.5 mg/dL 10.4  9.8  9.9     Current antihypertensive regimen:  Benazepril 20 mg 1 tablet twice  daily - Appropriate, Query effective, Safe, Accessible Carvedilol 25 mg 1 tablet twice daily - Appropriate, Query effective, Safe, Accessible  Furosemide 20 mg 1 tablet daily - Appropriate, Query effective, Safe, Accessible How often are you checking your Blood Pressure? daily Current home BP readings: Patient reports she was out in the car with her husband so did not have access to her readings. She and husband reports that since seeing Dr Volanda Napoleon her numbers have greatly improved. Blood pressures average 130/70 at least an hour after taking morning dose of medications. She reports the did not check today as they had a morning meeting. Advised her to notify the office or she may call me back if she notices it reaching higher than 140 on the top or more than 80 for the bottom she was in agreement. She denies any hyper/hypotensive symptoms at this time. She reports she has always had headaches upon waking for several years. What recent interventions/DTPs have been made by any provider to improve Blood Pressure control since last CPP Visit: Patient reports she has been incorporating much more water into her diet as well as she has  significantly cut back on the sodium in her meals. Any recent hospitalizations or ED visits since last visit with CPP? none  Adherence Review: Is the patient currently on ACE/ARB medication? Yes Does the patient have >5 day gap between last estimated fill dates? No    Care Gaps: Flu Vaccine - Overdue TDAP - Postponed COVID Booster - Postponed BP- 130/70 (home) CCM-9/23 AWV- 11/22  Star Rating Drugs: Benazepril (Lotensin) 20 mg - Last filled 12/24/21 90 DS at  [facility] Pharmacist Assistant [phone]

## 2022-01-06 DIAGNOSIS — Z923 Personal history of irradiation: Secondary | ICD-10-CM | POA: Diagnosis not present

## 2022-01-06 DIAGNOSIS — Z9889 Other specified postprocedural states: Secondary | ICD-10-CM | POA: Diagnosis not present

## 2022-01-06 DIAGNOSIS — G500 Trigeminal neuralgia: Secondary | ICD-10-CM | POA: Diagnosis not present

## 2022-01-06 DIAGNOSIS — H3561 Retinal hemorrhage, right eye: Secondary | ICD-10-CM | POA: Diagnosis not present

## 2022-01-07 ENCOUNTER — Other Ambulatory Visit: Payer: Self-pay

## 2022-01-11 ENCOUNTER — Other Ambulatory Visit: Payer: Self-pay | Admitting: Internal Medicine

## 2022-01-11 ENCOUNTER — Encounter: Payer: Self-pay | Admitting: Family Medicine

## 2022-01-11 DIAGNOSIS — Z923 Personal history of irradiation: Secondary | ICD-10-CM | POA: Diagnosis not present

## 2022-01-11 DIAGNOSIS — Z9221 Personal history of antineoplastic chemotherapy: Secondary | ICD-10-CM | POA: Diagnosis not present

## 2022-01-11 DIAGNOSIS — C153 Malignant neoplasm of upper third of esophagus: Secondary | ICD-10-CM | POA: Diagnosis not present

## 2022-01-11 DIAGNOSIS — R1319 Other dysphagia: Secondary | ICD-10-CM | POA: Diagnosis not present

## 2022-01-13 ENCOUNTER — Inpatient Hospital Stay: Payer: Medicare Other

## 2022-01-13 ENCOUNTER — Ambulatory Visit: Payer: Medicare Other | Admitting: Hematology

## 2022-01-13 ENCOUNTER — Inpatient Hospital Stay: Payer: Medicare Other | Attending: Nurse Practitioner

## 2022-01-13 ENCOUNTER — Other Ambulatory Visit: Payer: Self-pay

## 2022-01-13 ENCOUNTER — Inpatient Hospital Stay: Payer: Medicare Other | Admitting: Dietician

## 2022-01-13 VITALS — BP 148/79 | HR 56 | Temp 97.7°F | Resp 16 | Ht 66.0 in | Wt 132.0 lb

## 2022-01-13 DIAGNOSIS — Z5112 Encounter for antineoplastic immunotherapy: Secondary | ICD-10-CM | POA: Diagnosis not present

## 2022-01-13 DIAGNOSIS — C153 Malignant neoplasm of upper third of esophagus: Secondary | ICD-10-CM

## 2022-01-13 DIAGNOSIS — Z79899 Other long term (current) drug therapy: Secondary | ICD-10-CM | POA: Insufficient documentation

## 2022-01-13 LAB — CBC WITH DIFFERENTIAL (CANCER CENTER ONLY)
Abs Immature Granulocytes: 0 10*3/uL (ref 0.00–0.07)
Basophils Absolute: 0.1 10*3/uL (ref 0.0–0.1)
Basophils Relative: 2 %
Eosinophils Absolute: 0.2 10*3/uL (ref 0.0–0.5)
Eosinophils Relative: 5 %
HCT: 44.1 % (ref 36.0–46.0)
Hemoglobin: 14.6 g/dL (ref 12.0–15.0)
Immature Granulocytes: 0 %
Lymphocytes Relative: 14 %
Lymphs Abs: 0.5 10*3/uL — ABNORMAL LOW (ref 0.7–4.0)
MCH: 29.5 pg (ref 26.0–34.0)
MCHC: 33.1 g/dL (ref 30.0–36.0)
MCV: 89.1 fL (ref 80.0–100.0)
Monocytes Absolute: 0.4 10*3/uL (ref 0.1–1.0)
Monocytes Relative: 9 %
Neutro Abs: 2.8 10*3/uL (ref 1.7–7.7)
Neutrophils Relative %: 70 %
Platelet Count: 160 10*3/uL (ref 150–400)
RBC: 4.95 MIL/uL (ref 3.87–5.11)
RDW: 14.2 % (ref 11.5–15.5)
WBC Count: 3.9 10*3/uL — ABNORMAL LOW (ref 4.0–10.5)
nRBC: 0 % (ref 0.0–0.2)

## 2022-01-13 LAB — CMP (CANCER CENTER ONLY)
ALT: 16 U/L (ref 0–44)
AST: 22 U/L (ref 15–41)
Albumin: 4 g/dL (ref 3.5–5.0)
Alkaline Phosphatase: 100 U/L (ref 38–126)
Anion gap: 3 — ABNORMAL LOW (ref 5–15)
BUN: 26 mg/dL — ABNORMAL HIGH (ref 8–23)
CO2: 35 mmol/L — ABNORMAL HIGH (ref 22–32)
Calcium: 10.4 mg/dL — ABNORMAL HIGH (ref 8.9–10.3)
Chloride: 106 mmol/L (ref 98–111)
Creatinine: 0.89 mg/dL (ref 0.44–1.00)
GFR, Estimated: >60 mL/min (ref >60)
Glucose, Bld: 108 mg/dL — ABNORMAL HIGH (ref 70–99)
Potassium: 4 mmol/L (ref 3.5–5.1)
Sodium: 144 mmol/L (ref 135–145)
Total Bilirubin: 0.7 mg/dL (ref 0.3–1.2)
Total Protein: 6.6 g/dL (ref 6.5–8.1)

## 2022-01-13 LAB — TSH: TSH: 1.697 u[IU]/mL (ref 0.350–4.500)

## 2022-01-13 MED ORDER — SODIUM CHLORIDE 0.9 % IV SOLN
480.0000 mg | Freq: Once | INTRAVENOUS | Status: AC
  Administered 2022-01-13: 480 mg via INTRAVENOUS
  Filled 2022-01-13: qty 48

## 2022-01-13 MED ORDER — SODIUM CHLORIDE 0.9 % IV SOLN: Freq: Once | INTRAVENOUS | Status: AC

## 2022-01-13 NOTE — Patient Instructions (Signed)
[facility]   Discharge Instructions: Thank you for choosing [facility] to provide your oncology and hematology care.   If you have a lab appointment with the [facility], please go directly to the [facility] and check in at the registration area.   Wear comfortable clothing and clothing appropriate for easy access to any Portacath or PICC line.   We strive to give you quality time with your provider. You may need to reschedule your appointment if you arrive late (15 or more minutes).  Arriving late affects you and other patients whose appointments are after yours.  Also, if you miss three or more appointments without notifying the office, you may be dismissed from the clinic at the provider's discretion.      For prescription refill requests, have your pharmacy contact our office and allow 72 hours for refills to be completed.    Today you received the following chemotherapy and/or immunotherapy agents: nivolumab      To help prevent nausea and vomiting after your treatment, we encourage you to take your nausea medication as directed.  BELOW ARE SYMPTOMS THAT SHOULD BE REPORTED IMMEDIATELY: *FEVER GREATER THAN 100.4 F (38 C) OR HIGHER *CHILLS OR SWEATING *NAUSEA AND VOMITING THAT IS NOT CONTROLLED WITH YOUR NAUSEA MEDICATION *UNUSUAL SHORTNESS OF BREATH *UNUSUAL BRUISING OR BLEEDING *URINARY PROBLEMS (pain or burning when urinating, or frequent urination) *BOWEL PROBLEMS (unusual diarrhea, constipation, pain near the anus) TENDERNESS IN MOUTH AND THROAT WITH OR WITHOUT PRESENCE OF ULCERS (sore throat, sores in mouth, or a toothache) UNUSUAL RASH, SWELLING OR PAIN  UNUSUAL VAGINAL DISCHARGE OR ITCHING   Items with * indicate a potential emergency and should be followed up as soon as possible or go to the Emergency Department if any problems should occur.  Please show the CHEMOTHERAPY ALERT CARD or IMMUNOTHERAPY ALERT CARD at check-in  to the Emergency Department and triage nurse.  Should you have questions after your visit or need to cancel or reschedule your appointment, please contact [facility]  Dept: [phone]  and follow the prompts.  Office hours are 8:00 a.m. to 4:30 p.m. Monday - Friday. Please note that voicemails left after 4:00 p.m. may not be returned until the following business day.  We are closed weekends and major holidays. You have access to a nurse at all times for urgent questions. Please call the main number to the clinic Dept: [phone] and follow the prompts.   For any non-urgent questions, you may also contact your provider using MyChart. We now offer e-Visits for anyone 58 and older to request care online for non-urgent symptoms. For details visit [email or web address].   Also download the MyChart app! Go to the app store, search "MyChart", open the app, select [facility], and log in with your MyChart username and password.  Masks are optional in the cancer centers. If you would like for your care team to wear a mask while they are taking care of you, please let them know. You may have one support person who is at least 82 years old accompany you for your appointments.

## 2022-01-13 NOTE — Progress Notes (Signed)
Nutrition Follow-up:  Patient has completed concurrent chemoradiation for esophageal cancer. She is currently receiving Nivolumab q4w (started 05/21/21).  Noted last esophageal dilation to 12 mm on 7/18 -regular diet with thin liquids per 5/25 MBS  Met with patient in infusion. She reports eating more orally. Patient says Dr. Carlean Purl is wanting her to eat more variety/textures. Patient reports altered taste of foods. They are bland and not good. Often times she does not like choices that are offered at ALF. She and her husband have been able to dine out with improved oral intake. Yesterday she recalls a piece of cake for breakfast, cheese and crackers for lunch, cup of soup and lasagna for dinner. She is drinking 2 cartons of Nutren 1.5 mouth. This does not taste good. Patient has been using tube for water only. She is unsure how long it has been since she has had a bolus feeding ~couple of weeks.    Medications: removed.   Labs: glucose 108, BUN 26, Ca 10.4  Anthropometrics: Weight 132 lb today  8/7 - 131 lb 9.6 oz 7/28 - 134 lb 2 oz 6/30 - 132 lb 11.2 oz    NUTRITION DIAGNOSIS: Inadequate oral intake improving   INTERVENTION:  Continue advancing diet with variety of foods/textures as tolerated Recommend high calorie high protein foods for weight maintenance Continue daily water flushes  If pt continues to maintain weight orally at f/u, would recommend removal of feeding tube    MONITORING, EVALUATION, GOAL: weight trends, oral intake    NEXT VISIT: Thursday September 21 during infusion with Pamala Hurry

## 2022-01-14 ENCOUNTER — Ambulatory Visit: Payer: Medicare Other | Admitting: Hematology

## 2022-01-14 ENCOUNTER — Other Ambulatory Visit: Payer: Medicare Other

## 2022-01-14 ENCOUNTER — Ambulatory Visit: Payer: Medicare Other

## 2022-01-14 ENCOUNTER — Encounter: Payer: Self-pay | Admitting: Hematology

## 2022-01-15 LAB — T4: T4, Total: 7.4 ug/dL (ref 4.5–12.0)

## 2022-01-20 DIAGNOSIS — C159 Malignant neoplasm of esophagus, unspecified: Secondary | ICD-10-CM | POA: Diagnosis not present

## 2022-01-20 DIAGNOSIS — R1310 Dysphagia, unspecified: Secondary | ICD-10-CM | POA: Diagnosis not present

## 2022-01-25 ENCOUNTER — Telehealth: Payer: Medicare Other

## 2022-01-26 ENCOUNTER — Ambulatory Visit: Payer: Medicare Other | Admitting: Family Medicine

## 2022-01-27 ENCOUNTER — Encounter: Payer: Self-pay | Admitting: Family Medicine

## 2022-01-27 ENCOUNTER — Telehealth: Payer: Self-pay | Admitting: Internal Medicine

## 2022-01-27 ENCOUNTER — Ambulatory Visit (INDEPENDENT_AMBULATORY_CARE_PROVIDER_SITE_OTHER): Payer: Medicare Other | Admitting: Family Medicine

## 2022-01-27 VITALS — BP 110/64 | HR 55 | Temp 98.1°F | Ht 66.0 in | Wt 132.7 lb

## 2022-01-27 DIAGNOSIS — R300 Dysuria: Secondary | ICD-10-CM | POA: Diagnosis not present

## 2022-01-27 DIAGNOSIS — N3000 Acute cystitis without hematuria: Secondary | ICD-10-CM

## 2022-01-27 LAB — POC URINALSYSI DIPSTICK (AUTOMATED)
Bilirubin, UA: NEGATIVE
Blood, UA: NEGATIVE
Glucose, UA: NEGATIVE
Ketones, UA: NEGATIVE
Nitrite, UA: NEGATIVE
Protein, UA: NEGATIVE
Spec Grav, UA: 1.025 (ref 1.010–1.025)
Urobilinogen, UA: 0.2 E.U./dL
pH, UA: 6 (ref 5.0–8.0)

## 2022-01-27 MED ORDER — CIPROFLOXACIN HCL 500 MG PO TABS: 500.0000 mg | ORAL_TABLET | Freq: Two times a day (BID) | ORAL | 0 refills | Status: DC

## 2022-01-27 NOTE — Telephone Encounter (Signed)
error 

## 2022-01-27 NOTE — Progress Notes (Signed)
   Established Patient Office Visit  Subjective   Patient ID: [name], female    DOB: 1940/07/11  Age: 81 y.o. MRN: [id number]  Chief Complaint  Patient presents with   Dysuria    Patient complains of burning with urination x4-5 days, tried cranberry juice with no relief    Dysuria  This is a new problem. The current episode started in the past 7 days. The problem occurs every urination. The problem has been unchanged. The quality of the pain is described as burning. The pain is mild. There has been no fever. Associated symptoms include frequency and urgency. Pertinent negatives include no flank pain or hematuria.      Review of Systems  Genitourinary:  Positive for dysuria, frequency and urgency. Negative for flank pain and hematuria.      Objective:     BP 110/64 (BP Location: Left Arm, Patient Position: Sitting, Cuff Size: Normal)   Pulse (!) 55   Temp 98.1 F (36.7 C) (Oral)   Ht 5' 6"  (1.676 m)   Wt 132 lb 11.2 oz (60.2 kg)   SpO2 97%   BMI 21.42 kg/m    Physical Exam Abdominal:     General: Abdomen is flat. There is no distension.     Tenderness: There is no abdominal tenderness. There is no right CVA tenderness or left CVA tenderness.      Results for orders placed or performed in visit on 01/27/22  POCT Urinalysis Dipstick (Automated)  Result Value Ref Range   Color, UA yellow    Clarity, UA cloudy    Glucose, UA Negative Negative   Bilirubin, UA negative    Ketones, UA negative    Spec Grav, UA 1.025 1.010 - 1.025   Blood, UA negative    pH, UA 6.0 5.0 - 8.0   Protein, UA Negative Negative   Urobilinogen, UA 0.2 0.2 or 1.0 E.U./dL   Nitrite, UA negative    Leukocytes, UA Small (1+) (A) Negative      The ASCVD Risk score (Arnett DK, et al., 2019) failed to calculate for the following reasons:   The 2019 ASCVD risk score is only valid for ages 80 to 36    Assessment & Plan:   Problem List Items Addressed This Visit    None Visit Diagnoses     Dysuria    -  Primary   Relevant Orders   POCT Urinalysis Dipstick (Automated) (Completed)   Acute cystitis without hematuria       Relevant Medications   ciprofloxacin (CIPRO) 500 MG tablet   Other Relevant Orders   Culture, Urine     Patient's history of strongly positive for UTI, however her UA showed only small LE, I will send her urine for culture and rx cipro BID for 5 days to cover common urinary pathogens.  No follow-ups on file.    Farrel Conners, MD

## 2022-01-29 LAB — URINE CULTURE
MICRO NUMBER:: 13886091
SPECIMEN QUALITY:: ADEQUATE

## 2022-01-30 ENCOUNTER — Other Ambulatory Visit: Payer: Self-pay | Admitting: Hematology

## 2022-01-30 DIAGNOSIS — C153 Malignant neoplasm of upper third of esophagus: Secondary | ICD-10-CM

## 2022-01-31 ENCOUNTER — Telehealth: Payer: Self-pay | Admitting: Pharmacist

## 2022-01-31 NOTE — Chronic Care Management (AMB) (Signed)
    Chronic Care Management Pharmacy Assistant   Name: [name]  MRN: [id number] DOB: 1941-02-03  01/31/22 APPOINTMENT REMINDER   Patient was reminded to have all medications, supplements and any blood glucose and blood pressure readings available for review with [name], Pharm. D, for telephone visit on 02/01/22 at 11:45.    Care Gaps: BP- 110/64 01/27/22 AWV- 11/22  Star Rating Drug: Benazepril (Lotensin) 20 mg - Last filled 12/24/21 90 DS at  [facility]    Medications: Outpatient Encounter Medications as of 01/31/2022  Medication Sig Note   benazepril (LOTENSIN) 20 MG tablet TAKE 1 TABLET(20 MG) BY MOUTH TWICE DAILY    carvedilol (COREG) 25 MG tablet TAKE 1 TABLET(25 MG) BY MOUTH TWICE DAILY WITH A MEAL    ciprofloxacin (CIPRO) 500 MG tablet Take 1 tablet (500 mg total) by mouth 2 (two) times daily for 5 days.    conjugated estrogens (PREMARIN) vaginal cream Place 1 applicator vaginally once a week.    fluticasone (FLONASE) 50 MCG/ACT nasal spray Place 2 sprays into both nostrils as needed for allergies or rhinitis. 09/22/2021: On hold while doing chemo treatments   furosemide (LASIX) 20 MG tablet TAKE 1 TABLET(20 MG) BY MOUTH DAILY    guaiFENesin-codeine 100-10 MG/5ML syrup Place 5 mLs into feeding tube 2 (two) times daily as needed for cough.    lamoTRIgine (LAMICTAL) 100 MG tablet Place 2.5 tablets (250 mg total) into feeding tube 2 (two) times daily.    loperamide (IMODIUM) 2 MG capsule 2 mg daily as needed for diarrhea or loose stools. Per tube    Multiple Minerals-Vitamins (CAL-MAG-ZINC-D PO) Place 1 tablet into feeding tube 2 (two) times daily.    Multiple Vitamin (MULTIVITAMIN WITH MINERALS) TABS tablet Place 1 tablet into feeding tube in the morning.    Nutritional Supplements (NUTREN 1.5) LIQD Place 237 mLs into feeding tube in the morning and at bedtime.    omeprazole (PRILOSEC) 20 MG capsule 20 mg at bedtime. Per tube    ondansetron (ZOFRAN-ODT) 4 MG  disintegrating tablet DISSOLVE 1 TO 2 TABLETS(4 TO 8 MG) ON THE TONGUE EVERY 8 HOURS AS NEEDED FOR NAUSEA OR VOMITING    Polyethyl Glycol-Propyl Glycol (SYSTANE) 0.4-0.3 % GEL ophthalmic gel Place 1 application into both eyes 2 (two) times daily as needed (dry eyes).    prochlorperazine (COMPAZINE) 10 MG tablet TAKE 1 TABLET(10 MG) BY MOUTH EVERY 6 HOURS AS NEEDED FOR NAUSEA OR VOMITING (Patient taking differently: Take 10 mg by mouth every 6 (six) hours as needed for nausea or vomiting.)    sucralfate (CARAFATE) 1 GM/10ML suspension Take 10 mLs (1 g total) by mouth 4 (four) times daily -  with meals and at bedtime.    No facility-administered encounter medications on file as of 01/31/2022.     [facility] Clinical Pharmacist Assistant [phone]

## 2022-02-01 ENCOUNTER — Ambulatory Visit (INDEPENDENT_AMBULATORY_CARE_PROVIDER_SITE_OTHER): Payer: Medicare Other | Admitting: Pharmacist

## 2022-02-01 ENCOUNTER — Telehealth: Payer: Self-pay | Admitting: Pharmacist

## 2022-02-01 DIAGNOSIS — I10 Essential (primary) hypertension: Secondary | ICD-10-CM

## 2022-02-01 DIAGNOSIS — Z79899 Other long term (current) drug therapy: Secondary | ICD-10-CM

## 2022-02-01 MED ORDER — CEPHALEXIN 250 MG/5ML PO SUSR: 500.0000 mg | Freq: Three times a day (TID) | ORAL | 0 refills | Status: AC

## 2022-02-01 NOTE — Telephone Encounter (Signed)
Sending in liquid cephalexin for 5 days.  If not better then revisit with urine specimen.

## 2022-02-01 NOTE — Progress Notes (Signed)
Chronic Care Management Pharmacy Note  02/09/2022 Name:  [name] MRN:  [id number] DOB:  1940/07/01  Summary: Pts nurse is checking BP at home regularly BP mostly at goal < 140/90   Recommendations/Changes made from today's visit: -Recommended continued BP monitoring at home and recommended keeping a log of readings -Discussed with PCP about starting another antibiotic for continued UTI symptoms   Plan: BP assessment in 3 months Follow up in 6 months   Subjective: [name] is an 81 y.o. year old female who is a primary patient of [name], [name], MD.  The CCM team was consulted for assistance with disease management and care coordination needs.    Engaged with patient by telephone for follow up visit in response to provider referral for pharmacy case management and/or care coordination services.   Consent to Services:  The patient was given information about Chronic Care Management services, agreed to services, and gave verbal consent prior to initiation of services.  Please see initial visit note for detailed documentation.   Patient Care Team: [name], [name], MD as PCP - General [name], MD as PCP - Cardiology (Cardiology) Cindie Crumbly (Neurosurgery) Irine Seal, MD as Attending Physician (Urology) Erline Levine, MD as Consulting Physician (Neurosurgery) [name], MD as Consulting Physician (Neurology) Viona Gilmore, [facility] as Pharmacist (Pharmacist) [name], MD as Consulting Physician (Gastroenterology) Carol Ada, MD as Consulting Physician (Gastroenterology) [name], MD (Inactive) as Consulting Physician (Otolaryngology) [name], MD as Consulting Physician (Hematology) Kyung Rudd, MD as Consulting Physician (Radiation Oncology) Kyung Rudd, MD as Consulting Physician (Radiation Oncology)  Recent office visits: 01/27/22 [name], MD: Patient presented for dysuria.  Prescribed Cipro  BID x 5 days.  12/27/21 [name], MD - Patient presented for Essential Hypertension and other concerns. Prescribed Amoxicillin 500 mg.  11/09/21 [name], MD: Patient presented for injury of right wrist.  Follow up in 3 months.  Recent consult visits: 01/13/22 [name], [place] (dietician): Patient presented for nutrition counseling with current esophageal cancer.  01/13/22 [name], RN (oncology): Patient presented for Opdivo infusion.  01/11/22 [name], MD (ENT): Patient presented for dysphagia follow up.  Follow up PRN.  01/06/22 [name], PA-C (neurosurgery): Patient presented for trigeminal neuralgia follow up. Follow up PRN.  12/17/21 Patient presented to [facility] for Treatment.   12/17/21 [name], MD (Oncology) - Patient presented for Malignant neoplasm of upper third esophagus. Stopped Keppra.  11/19/21 [name], MD (Oncology) - Patient presented for Malignant neoplasm of upper third esophagus and Oncology treatment. No medication changes.   11/11/21 [name], MD (Orthopedic Surg) - Patient presented for pain in right wrist and other concerns. No medication changes.  11/11/21 [name], MD - Patient presented for Pain in right wrist and other concerns. No medication changes.   10/22/21 [name], [name], PA-C ([place]) - Patient presented for Oncology treatment. No medication changes.   10/14/21 [name], MD [name]) - Patient presented for Modified Barium Swallow.    10/07/21 [name], PA-C ( Neurosurgery) - Patient presented via telehealth for Trigeminal neuralgia of right side of face and other concerns. No medication changes.   09/24/21 [name], MD (Oncology) -Patient presented for Malignant neoplasm of upper third esophagus. No medication changes.   09/08/21 [name], [name] ( Neuro) - Patient presented for Trigeminal neuralgia. No medication changes.    08/27/21 [name],  [name], MD (Oncology) -Patient presented for Malignant neoplasm of upper third esophagus. No medication changes.  06/08/21 [name], MD (cardiology): Patient presented for HTN follow up. Follow up in 1 year.  Hospital visits: Medication Reconciliation was completed by comparing discharge summary, patient's EMR and Pharmacy list, and upon discussion with patient.   Patient presented to [facility] on 12/07/21 due to EGD with Propofol. Patient was present for 3 hours.   New?Medications Started at [facility] Discharge:?? -started  none   Medication Changes at Hospital Discharge: -Changed  none   Medications Discontinued at Hospital Discharge: -Stopped  None   Medications that remain the same after Hospital Discharge:??  -All other medications will remain the same.    Patient presented to [facility] on 11/07/21 due to Wrist pain.   New?Medications Started at [facility] Discharge:?? -started  none   Medication Changes at Hospital Discharge: -Changed  none   Medications Discontinued at Hospital Discharge: -Stopped  none   Medications that remain the same after Hospital Discharge:??  -All other medications will remain the same.           Medication Reconciliation was completed by comparing discharge summary, patient's EMR and Pharmacy list, and upon discussion with patient.   Patient presented to [facility] on 09/22/21 due to Radiation induced esophageal stricture. Patient was present for 4 hours.   New?Medications Started at [facility] Discharge:?? -started  none   Medication Changes at Hospital Discharge: -Changed  none   Medications Discontinued at Hospital Discharge: -Stopped  none   Medications that remain the same after Hospital Discharge:??  -All other medications will remain the same.         Patient presented to [facility] on 08/23/21 due to Radiation induced esophageal stricture. Patient was present for 4  hours.   New?Medications Started at [facility] Discharge:?? -started  none   Medication Changes at Hospital Discharge: -Changed  sucralfate CARAFATE   Medications Discontinued at Hospital Discharge: -Stopped  none   Medications that remain the same after Hospital Discharge:??  -All other medications will remain the same.       Patient presented to [facility] on 08/23/21 due to Radiation induced esophageal stricture. Patient was present for 4 hours.   New?Medications Started at [facility] Discharge:?? -started  none   Medication Changes at Hospital Discharge: -Changed  sucralfate CARAFATE   Medications Discontinued at Hospital Discharge: -Stopped  none   Medications that remain the same after Hospital Discharge:??  -All other medications will remain the same.   Objective:  Lab Results  Component Value Date   CREATININE 0.89 01/13/2022   BUN 26 (H) 01/13/2022   GFR 54.44 (L) 12/27/2021   GFRNONAA >60 01/13/2022   GFRAA 62 07/08/2020   NA 144 01/13/2022   K 4.0 01/13/2022   CALCIUM 10.4 (H) 01/13/2022   CO2 35 (H) 01/13/2022    Lab Results  Component Value Date/Time   HGBA1C 5.2 11/09/2021 12:31 PM   HGBA1C 5.5 05/25/2018 10:52 AM   GFR 54.44 (L) 12/27/2021 03:16 PM   GFR 58.37 (L) 02/04/2021 03:31 PM    Last diabetic Eye exam: No results found for: "HMDIABEYEEXA"  Last diabetic Foot exam: No results found for: "HMDIABFOOTEX"   Lab Results  Component Value Date   CHOL 279 (H) 05/27/2019   HDL 118.80 05/27/2019   LDLCALC 145 (H) 05/27/2019   LDLDIRECT 135.9 10/16/2012   TRIG 78.0 05/27/2019   CHOLHDL 2 05/27/2019  Latest Ref Rng & Units 01/13/2022    9:50 AM 12/17/2021    9:27 AM 11/19/2021    9:19 AM  Hepatic Function  Total Protein 6.5 - 8.1 g/dL 6.6  6.7  6.6   Albumin 3.5 - 5.0 g/dL 4.0  4.0  3.8   AST 15 - 41 U/L 22  18  18    ALT 0 - 44 U/L 16  13  13    Alk Phosphatase 38 - 126 U/L 100  101  100   Total Bilirubin 0.3 - 1.2 mg/dL  0.7  0.5  0.6     Lab Results  Component Value Date/Time   TSH 1.697 01/13/2022 09:50 AM   TSH 1.97 12/27/2021 03:16 PM   TSH 1.296 12/17/2021 09:27 AM   TSH 0.88 07/01/2020 02:49 PM   FREET4 0.74 05/30/2017 08:01 AM       Latest Ref Rng & Units 01/13/2022    9:50 AM 12/17/2021    9:27 AM 11/19/2021    9:19 AM  CBC  WBC 4.0 - 10.5 K/uL 3.9  4.6  4.9   Hemoglobin 12.0 - 15.0 g/dL 14.6  14.0  13.3   Hematocrit 36.0 - 46.0 % 44.1  43.1  40.6   Platelets 150 - 400 K/uL 160  172  188     Lab Results  Component Value Date/Time   VD25OH 19.97 (L) 05/25/2018 10:52 AM    Clinical ASCVD: No  The ASCVD Risk score (Arnett DK, et al., 2019) failed to calculate for the following reasons:   The 2019 ASCVD risk score is only valid for ages 49 to 50       01/27/2022    1:14 PM 12/27/2021    2:43 PM 04/06/2021    3:51 PM  Depression screen PHQ 2/9  Decreased Interest 0 0 0  Down, Depressed, Hopeless 0 0 0  PHQ - 2 Score 0 0 0  Altered sleeping 0 0   Tired, decreased energy 0 1   Change in appetite 0 0   Feeling bad or failure about yourself  0 0   Trouble concentrating 0 0   Moving slowly or fidgety/restless 0 0   Suicidal thoughts 0 0   PHQ-9 Score 0 1   Difficult doing work/chores  Not difficult at all      Social History   Tobacco Use  Smoking Status Never  Smokeless Tobacco Never   BP Readings from Last 3 Encounters:  01/27/22 110/64  01/13/22 (!) 148/79  12/27/21 (!) 178/92   Pulse Readings from Last 3 Encounters:  01/27/22 (!) 55  01/13/22 (!) 56  12/27/21 (!) 56   Wt Readings from Last 3 Encounters:  01/27/22 132 lb 11.2 oz (60.2 kg)  01/13/22 132 lb (59.9 kg)  12/27/21 131 lb 9.6 oz (59.7 kg)    Assessment/Interventions: Review of patient past medical history, allergies, medications, health status, including review of consultants reports, laboratory and other test data, was performed as part of comprehensive evaluation and provision of chronic care management  services.   SDOH:  (Social Determinants of Health) assessments and interventions performed: Yes  SDOH Interventions    Flowsheet Row Chronic Care Management from 02/01/2022 in [place] at [place] Management from 04/03/2020 in [place] at [place]  SDOH Interventions    Transportation Interventions -- Intervention Not Indicated  Financial Strain Interventions Intervention Not Indicated Intervention Not Indicated       CCM Care Plan  Allergies  Allergen Reactions  Bactrim [Sulfamethoxazole-Trimethoprim] Hives and Itching   Hydrocodone Other (See Comments)    Rebound headaches    Medications Reviewed Today     Reviewed by Viona Gilmore, [facility] (Pharmacist) on 02/01/22 at 1157  Med List Status: <None>   Medication Order Taking? Sig Documenting Provider Last Dose Status Informant  benazepril (LOTENSIN) 20 MG tablet [id number] Yes TAKE 1 TABLET(20 MG) BY MOUTH TWICE DAILY [name], [name], MD Taking Active   carvedilol (COREG) 25 MG tablet [id number] Yes TAKE 1 TABLET(25 MG) BY MOUTH TWICE DAILY WITH A MEAL [name], [name], MD Taking Active   conjugated estrogens (PREMARIN) vaginal cream [id number]  Place 1 applicator vaginally once a week. [provider]  Active Spouse/Significant Other  fluticasone (FLONASE) 50 MCG/ACT nasal spray [id number]  Place 2 sprays into both nostrils as needed for allergies or rhinitis. [name], [name], MD  Active Spouse/Significant Other           Med Note Orvan Seen, Sharlette Dense   Wed Sep 22, 2021 11:57 AM) On hold while doing chemo treatments  furosemide (LASIX) 20 MG tablet [id number] Yes TAKE 1 TABLET(20 MG) BY MOUTH DAILY [name], [name], MD Taking Active Spouse/Significant Other  guaiFENesin-codeine 100-10 MG/5ML syrup [id number]  Place 5 mLs into feeding tube 2 (two) times daily as needed for cough. [name], MD  Active   lamoTRIgine (LAMICTAL) 100 MG tablet [id number] Yes Place 2.5 tablets (250 mg total) into  feeding tube 2 (two) times daily. [name], NP Taking Active   loperamide (IMODIUM) 2 MG capsule [id number]  2 mg daily as needed for diarrhea or loose stools. Per tube [provider]  Active Spouse/Significant Other  Multiple Minerals-Vitamins (CAL-MAG-ZINC-D PO) [id number]  Place 1 tablet into feeding tube 2 (two) times daily. [provider]  Active Spouse/Significant Other  Multiple Vitamin (MULTIVITAMIN WITH MINERALS) TABS tablet [id number]  Place 1 tablet into feeding tube in the morning. [provider]  Active Spouse/Significant Other  Nutritional Supplements (NUTREN 1.5) LIQD [id number]  Place 237 mLs into feeding tube in the morning and at bedtime. [provider]  Active Spouse/Significant Other  omeprazole (PRILOSEC) 20 MG capsule [id number]  20 mg at bedtime. Per tube [provider]  Active Spouse/Significant Other  ondansetron (ZOFRAN-ODT) 4 MG disintegrating tablet [id number]  DISSOLVE 1 TO 2 TABLETS(4 TO 8 MG) ON THE TONGUE EVERY 8 HOURS AS NEEDED FOR NAUSEA OR VOMITING [name], MD  Active   Polyethyl Glycol-Propyl Glycol (SYSTANE) 0.4-0.3 % GEL ophthalmic gel [id number]  Place 1 application into both eyes 2 (two) times daily as needed (dry eyes). [provider]  Active Spouse/Significant Other  prochlorperazine (COMPAZINE) 10 MG tablet [id number]  TAKE 1 TABLET(10 MG) BY MOUTH EVERY 6 HOURS AS NEEDED FOR NAUSEA OR VOMITING  Patient taking differently: Take 10 mg by mouth every 6 (six) hours as needed for nausea or vomiting.   [name], MD  Active Spouse/Significant Other  sucralfate (CARAFATE) 1 GM/10ML suspension [id number]  Take 10 mLs (1 g total) by mouth 4 (four) times daily -  with meals and at bedtime. [name], MD  Active Spouse/Significant Other            Patient Active Problem List   Diagnosis Date Noted   Radiation-induced esophageal stricture    Protein-calorie malnutrition, severe 02/27/2021    Malignant neoplasm of upper third esophagus ([place]) 02/18/2021   Dysphagia 02/03/2021   Hematoma of abdominal wall, initial encounter 05/16/2020   Spondylosis without  myelopathy or radiculopathy, lumbar region 03/27/2019   Radiculopathy due to lumbar intervertebral disc disorder 03/27/2019   Spinal stenosis of lumbar region with neurogenic claudication 03/27/2019   Diarrhea 11/27/2017   Unilateral headache 05/26/2015   Nausea without vomiting 12/10/2014   Essential hypertension 05/29/2014   Post-traumatic headache 09/19/2013   Low back pain radiating to right leg 01/16/2013   Sinus problem 11/20/2012   Chronic headaches morning 10/19/2012   Leg pain, posterior 08/16/2012   Sciatic neuritis 08/16/2012   Sleep disturbance, unspecified 08/16/2012   Medication management 03/24/2012   Preventative health care 09/24/2011   Trigeminal neuralgia 09/24/2011   Postmenopausal HRT (hormone replacement therapy) 09/24/2011   Polycythemia 09/20/2011   Morning headache 02/01/2011   Hearing aid worn    LOCALIZED SUPERFICIAL SWELLING MASS OR LUMP 09/21/2009   Hypnic headache 03/19/2008   Cholelithiasis with chronic cholecystitis 03/19/2008   Headache(784.0) 06/05/2007   JAW PAIN 04/06/2007   Osteoarthritis 04/06/2007   HYPERLIPIDEMIA 12/25/2006   HYPERTENSION 12/25/2006    Immunization History  Administered Date(s) Administered   Fluad Quad(high Dose 65+) 02/18/2019, 03/17/2020   Influenza Split 03/10/2011, 02/02/2012   Influenza Whole 02/18/2008, 03/03/2010   Influenza, High Dose Seasonal PF 03/04/2014, 02/01/2016, 02/22/2017, 03/20/2018   Influenza,inj,Quad PF,6+ Mos 03/05/2013   PFIZER(Purple Top)SARS-COV-2 Vaccination 06/01/2019, 06/22/2019   Pneumococcal Conjugate-13 11/13/2013   Pneumococcal Polysaccharide-23 03/23/2015   Td 05/23/1996   Tdap 09/19/2011   Zoster Recombinat (Shingrix) 11/02/2019, 04/15/2020   Zoster, Live 09/19/2011   Patient reports that her biggest concern is that  her urine is still cloudy and has some discomfort with urination. She just finished the cipro this morning and does not feel like it helped. She has some pain in lower back and not all the time and has the occasional burning with urination. Patient reports the cipro was very hard to swallow and had to crush this and it had a terrible taste. Patient does not want this again.  Patient does grind up her tablets and she takes with yogurt or applesauce. None of them have had a bad taste except cipro.   Patient reports her ankles are still swelling. Patient weighs herself most morning and stays usually around 130-135 lbs. She doesn't use the compression stockings because they aren't comfortable.  Conditions to be addressed/monitored:  Hypertension, Hyperlipidemia, GERD, Osteoporosis and migraines/trigeminal neuralgia, dyspnea, pain  Conditions addressed this visit: Hypertension, osteoporosis  Care Plan : [place]  Updates made by Viona Gilmore, [place] since 02/09/2022 12:00 AM     Problem: Problem: Hypertension, Hyperlipidemia, GERD, Osteoporosis and migraines/trigeminal neuralgia, dyspnea, pain      Long-Range Goal: Patient-Specific Goal   Start Date: 07/30/2020  Expected End Date: 07/30/2021  Recent Progress: On track  Priority: High  Note:   Current Barriers:  Unable to independently monitor therapeutic efficacy  Pharmacist Clinical Goal(s):  Patient will achieve adherence to monitoring guidelines and medication adherence to achieve therapeutic efficacy through collaboration with PharmD and provider.   Interventions: 1:1 collaboration with [name], [name], MD regarding development and update of comprehensive plan of care as evidenced by provider attestation and co-signature Inter-disciplinary care team collaboration (see longitudinal plan of care) Comprehensive medication review performed; medication list updated in electronic medical record  Hypertension (BP goal  <140/90) -Not ideally controlled -Current treatment: Benazepril 20 mg 1 tablet twice daily - Appropriate, Query effective, Safe, Accessible Carvedilol 25 mg 1 tablet twice daily - Appropriate, Query effective, Safe, Accessible  Furosemide 20 mg 1 tablet  daily - Appropriate, Query effective, Safe, Accessible -Medications previously tried: clonidine (swelling) -Current home readings: 138/70 (checks with nurse in mid morning after morning medications) -Current dietary habits: eats some canned foods (chooses low sodium), eats some frozen foods - chips, hamburgers, sandwiches, sausage - recommended looking at package labels  -Current exercise habits: doing physical therapy exercises; twice a week soft exercise program through the TV -Denies hypotensive/hypertensive symptoms -Educated on Exercise goal of 150 minutes per week; Importance of home blood pressure monitoring; Proper BP monitoring technique; -Counseled to monitor BP at home at least weekly, document, and provide log at future appointments -Counseled on diet and exercise extensively Recommended to continue current medication  Hyperlipidemia: (LDL goal < 100) -Uncontrolled -Current treatment: No medications -Medications previously tried: none  -Current dietary patterns: did not discuss -Current exercise habits: doing physical therapy exercises; twice a week soft exercise program through the TV -Educated on Cholesterol goals;  Exercise goal of 150 minutes per week; -Counseled on diet and exercise extensively  Osteoporosis (Goal prevent fractures and improve bone density) -Controlled -Last DEXA Scan: 05/28/2018             T-Score femoral neck: R -1.3, L -2.0             T-Score total hip: n/a             T-Score lumbar spine: -0.9             T-Score forearm radius: n/a             10-year probability of major osteoporotic fracture: 21.4%             10-year probability of hip fracture: 6.0% -Patient is a candidate for  pharmacologic treatment due to T-Score -1.0 to -2.5 and 10-year risk of major osteoporotic fracture > 20% and T-Score -1.0 to -2.5 and 10-year risk of hip fracture > 3% -Current treatment  Calcium 600 mg & 800 units of vitamin D twice daily - Appropriate, Effective, Safe, Accessible -Medications previously tried: Alendronate (esophageal cancer) -Recommend [phone] units of vitamin D daily. Recommend 1200 mg of calcium daily from dietary and supplemental sources. Counseled on oral bisphosphonate administration: take in the morning, 30 minutes prior to food with 6-8 oz of water. Do not lie down for at least 30 minutes after taking. Recommend weight-bearing and muscle strengthening exercises for building and maintaining bone density. -Recommended repeat vitamin D level and repeat DEXA   Migraines/trigeminal neuralgia (Goal: minimize pain and severity) -Controlled -Current treatment  Lamotrigine 100 mg 2.5 tablet twice daily - Appropriate, Effective, Safe, Accessible -Medications previously tried: oxcarbazepine -Recommended to continue current medication  GERD (Goal: minimize symptoms of acid reflux/heartburn) -Controlled -Current treatment  Omeprazole 20 mg 1 capsule twice daily - taking once daily - Appropriate, Query effective, Safe, Accessible -Medications previously tried: none  -Recommended to continue current medication Recommended taking omeprazole as prescribed twice daily.  Pain (Goal: minimize pain) -Controlled -Current treatment  Tylenol 500 mg 2 tablets PRN -Medications previously tried: none  -Recommended to continue current medication   Health Maintenance -Vaccine gaps: COVID booster, second shingrix dose -Current therapy:  Ondansetron 4 mg ODT PRN Premarin vaginal cream 1 applicatorful twice weekly Systane eye drops PRN Flonase 2 sprays as needed - gets more headaches during allergy season Vitamin B (can't remember dose) -Educated on Cost vs benefit of each product  must be carefully weighed by individual consumer -Patient is satisfied with current therapy and denies issues -Recommended to continue current  medication  Patient Goals/Self-Care Activities Patient will:  - take medications as prescribed check blood pressure at least weekly, document, and provide at future appointments  Follow Up Plan: Telephone follow up appointment with care management team member scheduled for: 6 months        Medication Assistance: None required.  Patient affirms current coverage meets needs.  Compliance/Adherence/Medication fill history: Care Gaps: COVID booster, influenza vaccine, tetanus BP- 110/64 01/27/22   Star-Rating Drugs: Benazepril (Lotensin) 20 mg - Last filled 12/24/21 90 DS [facility]  Patient's preferred pharmacy is:  [id number] [address], [address] [place] [address] Phone: [phone] Fax: [phone]   Uses pill box? Yes Pt endorses 100% compliance  We discussed: Current pharmacy is preferred with insurance plan and patient is satisfied with pharmacy services Patient decided to: Continue current medication management strategy  Care Plan and Follow Up Patient Decision:  Patient agrees to Care Plan and Follow-up.  Plan: Telephone follow up appointment with care management team member scheduled for:  6 months  [name], PharmD [place] Pharmacist [place] at [place] [phone]

## 2022-02-01 NOTE — Telephone Encounter (Signed)
Spoke with patient during CCM telephone visit this morning and she requested advice from her PCP. She finished the Cipro this morning and doesn't think it helped her UTI. She reports her urine is still cloudy and she has discomfort with urination as well as some pain in her lower back which comes and goes. She denies itching but does note some burning when urinating. Will route to PCP for advice.

## 2022-02-01 NOTE — Telephone Encounter (Signed)
Called patient to let her know of new antibiotic sent in for UTI symptoms. Unable to reach and left a voicemail with information and requested a call back. Also sent patient a mychart message with prescription information.

## 2022-02-02 NOTE — Telephone Encounter (Signed)
Spoke with patient's husband about recent antibiotic sent in as patient did not respond to [employer]. He verbalized his understanding and is aware to call back if this does not clear the infection.

## 2022-02-09 NOTE — Patient Instructions (Signed)
Hi [name],   It was great to catch up again! Please keep on checking your blood pressures regularly. Don't forget to let Dr. Regis Bill know if your new antibiotic doesn't work.  Please reach out to me if you have any questions or need anything before our follow up!  Best, [name]  [name], PharmD, [place] Pharmacist [place] at [place]

## 2022-02-10 ENCOUNTER — Inpatient Hospital Stay: Payer: Medicare Other | Admitting: Hematology

## 2022-02-10 ENCOUNTER — Other Ambulatory Visit: Payer: Self-pay

## 2022-02-10 ENCOUNTER — Ambulatory Visit (HOSPITAL_COMMUNITY)
Admission: RE | Admit: 2022-02-10 | Discharge: 2022-02-10 | Disposition: A | Discharge status: Home / Self Care | Payer: Medicare Other | Source: Ambulatory Visit | Attending: Surgery | Admitting: Surgery

## 2022-02-10 ENCOUNTER — Inpatient Hospital Stay: Payer: Medicare Other | Admitting: Nutrition

## 2022-02-10 ENCOUNTER — Inpatient Hospital Stay: Payer: Medicare Other

## 2022-02-10 ENCOUNTER — Telehealth: Payer: Self-pay | Admitting: Internal Medicine

## 2022-02-10 ENCOUNTER — Inpatient Hospital Stay: Payer: Medicare Other | Attending: Nurse Practitioner

## 2022-02-10 ENCOUNTER — Other Ambulatory Visit: Payer: Self-pay | Admitting: Surgery

## 2022-02-10 VITALS — BP 153/74 | HR 60 | Temp 97.9°F | Resp 18 | Ht 66.0 in | Wt 133.4 lb

## 2022-02-10 DIAGNOSIS — I700 Atherosclerosis of aorta: Secondary | ICD-10-CM | POA: Diagnosis not present

## 2022-02-10 DIAGNOSIS — Z5112 Encounter for antineoplastic immunotherapy: Secondary | ICD-10-CM | POA: Insufficient documentation

## 2022-02-10 DIAGNOSIS — C159 Malignant neoplasm of esophagus, unspecified: Secondary | ICD-10-CM | POA: Insufficient documentation

## 2022-02-10 DIAGNOSIS — Z431 Encounter for attention to gastrostomy: Secondary | ICD-10-CM | POA: Insufficient documentation

## 2022-02-10 DIAGNOSIS — Z4682 Encounter for fitting and adjustment of non-vascular catheter: Secondary | ICD-10-CM | POA: Diagnosis not present

## 2022-02-10 DIAGNOSIS — I10 Essential (primary) hypertension: Secondary | ICD-10-CM | POA: Diagnosis not present

## 2022-02-10 DIAGNOSIS — C153 Malignant neoplasm of upper third of esophagus: Secondary | ICD-10-CM | POA: Diagnosis not present

## 2022-02-10 HISTORY — PX: IR GASTROSTOMY TUBE REMOVAL: IMG5492

## 2022-02-10 LAB — CBC WITH DIFFERENTIAL (CANCER CENTER ONLY)
Abs Immature Granulocytes: 0.01 10*3/uL (ref 0.00–0.07)
Basophils Absolute: 0 10*3/uL (ref 0.0–0.1)
Basophils Relative: 1 %
Eosinophils Absolute: 0.2 10*3/uL (ref 0.0–0.5)
Eosinophils Relative: 4 %
HCT: 44.5 % (ref 36.0–46.0)
Hemoglobin: 14.8 g/dL (ref 12.0–15.0)
Immature Granulocytes: 0 %
Lymphocytes Relative: 11 %
Lymphs Abs: 0.5 10*3/uL — ABNORMAL LOW (ref 0.7–4.0)
MCH: 29.5 pg (ref 26.0–34.0)
MCHC: 33.3 g/dL (ref 30.0–36.0)
MCV: 88.8 fL (ref 80.0–100.0)
Monocytes Absolute: 0.3 10*3/uL (ref 0.1–1.0)
Monocytes Relative: 7 %
Neutro Abs: 3.5 10*3/uL (ref 1.7–7.7)
Neutrophils Relative %: 77 %
Platelet Count: 148 10*3/uL — ABNORMAL LOW (ref 150–400)
RBC: 5.01 MIL/uL (ref 3.87–5.11)
RDW: 14.1 % (ref 11.5–15.5)
WBC Count: 4.5 10*3/uL (ref 4.0–10.5)
nRBC: 0 % (ref 0.0–0.2)

## 2022-02-10 LAB — CMP (CANCER CENTER ONLY)
ALT: 18 U/L (ref 0–44)
AST: 25 U/L (ref 15–41)
Albumin: 3.9 g/dL (ref 3.5–5.0)
Alkaline Phosphatase: 88 U/L (ref 38–126)
Anion gap: 5 (ref 5–15)
BUN: 20 mg/dL (ref 8–23)
CO2: 30 mmol/L (ref 22–32)
Calcium: 9.5 mg/dL (ref 8.9–10.3)
Chloride: 105 mmol/L (ref 98–111)
Creatinine: 0.99 mg/dL (ref 0.44–1.00)
GFR, Estimated: 57 mL/min — ABNORMAL LOW (ref >60)
Glucose, Bld: 215 mg/dL — ABNORMAL HIGH (ref 70–99)
Potassium: 3.9 mmol/L (ref 3.5–5.1)
Sodium: 140 mmol/L (ref 135–145)
Total Bilirubin: 0.9 mg/dL (ref 0.3–1.2)
Total Protein: 6.3 g/dL — ABNORMAL LOW (ref 6.5–8.1)

## 2022-02-10 MED ORDER — SODIUM CHLORIDE 0.9 % IV SOLN
480.0000 mg | Freq: Once | INTRAVENOUS | Status: AC
  Administered 2022-02-10: 480 mg via INTRAVENOUS
  Filled 2022-02-10: qty 48

## 2022-02-10 MED ORDER — SODIUM CHLORIDE 0.9 % IV SOLN: Freq: Once | INTRAVENOUS | Status: AC

## 2022-02-10 MED ORDER — SILVER NITRATE-POT NITRATE 75-25 % EX MISC
CUTANEOUS | Status: AC
  Administered 2022-02-10: 1 via TOPICAL
  Filled 2022-02-10: qty 10

## 2022-02-10 MED ORDER — SILVER NITRATE-POT NITRATE 75-25 % EX MISC: 1.0000 | Freq: Once | CUTANEOUS | Status: AC

## 2022-02-10 NOTE — Patient Instructions (Signed)
[facility]   Discharge Instructions: Thank you for choosing [facility] to provide your oncology and hematology care.   If you have a lab appointment with the [facility], please go directly to the [facility] and check in at the registration area.   Wear comfortable clothing and clothing appropriate for easy access to any Portacath or PICC line.   We strive to give you quality time with your provider. You may need to reschedule your appointment if you arrive late (15 or more minutes).  Arriving late affects you and other patients whose appointments are after yours.  Also, if you miss three or more appointments without notifying the office, you may be dismissed from the clinic at the provider's discretion.      For prescription refill requests, have your pharmacy contact our office and allow 72 hours for refills to be completed.    Today you received the following chemotherapy and/or immunotherapy agents: nivolumab      To help prevent nausea and vomiting after your treatment, we encourage you to take your nausea medication as directed.  BELOW ARE SYMPTOMS THAT SHOULD BE REPORTED IMMEDIATELY: *FEVER GREATER THAN 100.4 F (38 C) OR HIGHER *CHILLS OR SWEATING *NAUSEA AND VOMITING THAT IS NOT CONTROLLED WITH YOUR NAUSEA MEDICATION *UNUSUAL SHORTNESS OF BREATH *UNUSUAL BRUISING OR BLEEDING *URINARY PROBLEMS (pain or burning when urinating, or frequent urination) *BOWEL PROBLEMS (unusual diarrhea, constipation, pain near the anus) TENDERNESS IN MOUTH AND THROAT WITH OR WITHOUT PRESENCE OF ULCERS (sore throat, sores in mouth, or a toothache) UNUSUAL RASH, SWELLING OR PAIN  UNUSUAL VAGINAL DISCHARGE OR ITCHING   Items with * indicate a potential emergency and should be followed up as soon as possible or go to the Emergency Department if any problems should occur.  Please show the CHEMOTHERAPY ALERT CARD or IMMUNOTHERAPY ALERT CARD at check-in  to the Emergency Department and triage nurse.  Should you have questions after your visit or need to cancel or reschedule your appointment, please contact [facility]  Dept: [phone]  and follow the prompts.  Office hours are 8:00 a.m. to 4:30 p.m. Monday - Friday. Please note that voicemails left after 4:00 p.m. may not be returned until the following business day.  We are closed weekends and major holidays. You have access to a nurse at all times for urgent questions. Please call the main number to the clinic Dept: [phone] and follow the prompts.   For any non-urgent questions, you may also contact your provider using MyChart. We now offer e-Visits for anyone 47 and older to request care online for non-urgent symptoms. For details visit [email or web address].   Also download the MyChart app! Go to the app store, search "MyChart", open the app, select [facility], and log in with your MyChart username and password.  Masks are optional in the cancer centers. If you would like for your care team to wear a mask while they are taking care of you, please let them know. You may have one support Maurisha Mongeau who is at least 82 years old accompany you for your appointments.

## 2022-02-10 NOTE — Telephone Encounter (Signed)
Inbound call from patient inquiring about egd? Please give a call to further advise.  Thank you

## 2022-02-10 NOTE — Progress Notes (Signed)
[facility]   [phone] [phone]   Clinic Follow up Note   Patient Care Team: [name], [name], MD as PCP - General [name], MD as PCP - Cardiology (Cardiology) Cindie Crumbly (Neurosurgery) Irine Seal, MD as Attending Physician (Urology) Erline Levine, MD as Consulting Physician (Neurosurgery) [name], MD as Consulting Physician (Neurology) Viona Gilmore, [facility] as Pharmacist (Pharmacist) [name], MD as Consulting Physician (Gastroenterology) Carol Ada, MD as Consulting Physician (Gastroenterology) [name], MD (Inactive) as Consulting Physician (Otolaryngology) [name], MD as Consulting Physician (Hematology) Kyung Rudd, MD as Consulting Physician (Radiation Oncology) Kyung Rudd, MD as Consulting Physician (Radiation Oncology)  Date of Service:  02/10/2022  CHIEF COMPLAINT: f/u of esophageal cancer  CURRENT THERAPY:  Nivolumab, started 05/21/21, currently q4weeks  ASSESSMENT & PLAN:  [name] is a 81 y.o. female with   1. Squamous cell carcinoma of cervical esophagus, cTxN2M0 -presented with dysphagia and weight loss. EGD 9/15 and CT neck 02/05/21 showed 6 cm mass in the cervical/upper esophagus with possible local adenopathy. 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  -due to the location in the cervical esophagus, Dr. Kipp Brood feels she is not a surgical candidate.  -she received concurrent chemoRT with carbo/taxol 03/08/21 - 04/14/21. She required dose reduction with last cycle due to radiation dermatitis, hoarseness, dysphagia, and odynophagia but was able to complete treatment as scheduled.  -given PDL1+, she began immunotherapy with Nivolumab on 05/21/21. Will plan to give this for a total of a year if  NED. -last EGD with esophageal dilation on 12/07/21 with Dr. Carlean Purl. -restaging CT CAP on 12/14/21 showed stable esophageal wall thickening without evidence of recurrent or metastatic disease. She has no evidence of cancer on EGD and scan now, but still has risk of recurrence, especially in next 2-3 years  -labs reviewed, overall stable and adequate to proceed with Nivo today. Will obtain another scan in 2 months and plan to complete one year treatment in Dec 2023.   2. Dysphagia, weight loss, cough -Secondary to #1 -s/p G-tube placement by Dr. Harlow Asa on 02/22/21 -she continues esophageal dilation as needed under Dr. Carlean Purl, most recently on 12/07/21. She hopes for repeat soon. -she has not used her feeding tube in the last two months and would like to have it removed. I will refer her back to Dr. Harlow Asa.   3. Trigeminal neuralgia, sciatica, headaches -On lamotrigene alone -f/up Dr. Jaynee Eagles      PLAN: -proceed with Nivo today and continue every 4 weeks for 3 more doses  -referral back to Dr. Harlow Asa for PEG removal -f/u in 8 weeks, with lab and CT several days before   No problem-specific Assessment & Plan notes found for this encounter.   SUMMARY OF ONCOLOGIC HISTORY: Oncology History  Malignant neoplasm of upper third esophagus ([facility])  12/21/2020 Imaging   Laryngoscopy Comments:    On fiberoptic laryngoscopy through the right nostril the nasopharynx was clear.  The base of tongue vallecula and epiglottis were normal.    Piriform sinuses were clear bilaterally and vocal cords were clear with  normal vocal mobility.  No structural abnormalities noted.   01/22/2021 Imaging   DG esophagus IMPRESSION: 1. Luminal narrowing in the high cervical esophagus over a 3 cm segment is highly concerning for esophageal neoplasm. Inflammatory process would be a secondary consideration. Luminal narrowing  occurs approximately at the C5-C7 vertebral body level just below the glottis. Patient experienced  several episodes of choking related to this luminal narrowing. Recommend expedient upper GI endoscopy for evaluation. 2. Distal thoracic esophagus and GE junction appear normal.   02/04/2021 Procedure   EGD by Dr. Carlean Purl impression- One mass-like severe stenosis was found in the upper third of the esophagus. The stenosis was not traversed.   02/05/2021 Imaging   CT soft tissue neck w contrast IMPRESSION: Mass-like soft tissue thickening and mucosal hyperenhancement of the cervical esophagus and hypopharynx, spanning the C4-T1 levels, likely reflecting an esophageal malignancy. This measures up to 2.4 x 3.4 cm in transaxial dimensions, and 6.3 cm in craniocaudal dimension. Associated severe effacement of the esophageal lumen.   Centrally necrotic right paratracheal lymph node beneath the level of the mass, measuring 1.3 x 0.9 cm, and likely reflecting a site of nodal metastatic disease.   Additional lymph nodes along the posterior and inferior aspect of the right thyroid lobe (along the right aspect of the esophageal mass), which measure subcentimeter but are asymmetrically prominent and highly suspicious for additional sites of nodal metastatic disease.   02/12/2021 Pathology Results   FINAL MICROSCOPIC DIAGNOSIS:  A. ESOPHAGUS, UPPER CERVICAL #1, BIOPSY:  - Squamous cell carcinoma.  B. ESOPHAGUS, UPPER CERVICAL #2, BIOPSY:  - Squamous cell carcinoma.    02/12/2021 Cancer Staging   Staging form: Esophagus - Squamous Cell Carcinoma, AJCC 8th Edition - Clinical stage from 02/12/2021: Stage Unknown (cTX, cN2, cM0) - Signed by [name], MD on 03/08/2021 Stage prefix: Initial diagnosis   02/18/2021 Initial Diagnosis   Malignant neoplasm of upper third esophagus ([place])   02/23/2021 PET scan   IMPRESSION: Markedly hypermetabolic focal masslike thickening of the cervical esophagus, compatible with primary esophageal malignancy.   Markedly hypermetabolic right upper paratracheal lymph  node, compatible with metastatic disease.   Small right upper paratracheal and right level IIA lymph nodes with mild hypermetabolic activity, concerning for additional sites of nodal metastatic disease.   Aortic Atherosclerosis (ICD10-I70.0).   03/09/2021 - 04/13/2021 Chemotherapy   Patient is on Treatment Plan : ESOPHAGUS Carboplatin/PACLitaxel weekly x 6 weeks with XRT       05/21/2021 - 01/13/2022 Chemotherapy   Patient is on Treatment Plan : GASTROESOPHAGEAL Nivolumab q14d x 8 cycles / Nivolumab q28d     05/21/2021 -  Chemotherapy   Patient is on Treatment Plan : GASTROESOPHAGEAL Nivolumab (240) q14d x 8 cycles / Nivolumab (480) q28d        INTERVAL HISTORY:  [name] is here for a follow up of esophageal cancer. She was last seen by me on 12/17/21. She presents to the clinic accompanied by her husband. She reports she is doing well overall. She notes she is eating well, denies using her feeding tube. She reports a bothersome change to the inside of her lips that feels swollen or like ulcers. She denies pain, and there is no visual difference.   All other systems were reviewed with the patient and are negative.  MEDICAL HISTORY:  Past Medical History:  Diagnosis Date   Abdominal pain 05/29/2013   s/p rx of cephalo resistant e coli   but last rx NG  now residular ?  bladder sx repeat cx sx rx to ty and uro consult    ADJ DISORDER WITH MIXED ANXIETY & DEPRESSED MOOD 03/03/2010   Qualifier: Diagnosis of  By: [name], [name]    Agent resistant to multiple antibiotics 05/29/2013  e coli   bu NG on fu.     Anemia    Anxiety    ARF (acute renal failure) (HCC) 03/12/2015   Closed head injury 02/01/2011   from syncope and had scalp laceration  neg ct .     Closed head injury 5-6 yrs ago   Colitis 16/02/9603   Complication of anesthesia    migraine several hours after general anesthesia   Depression    esophageal ca 01/2021   Fatty liver    Gall stones 2016    see ct scan neg [facility]    GERD (gastroesophageal reflux disease)    Hearing aid worn    HOH (hard of hearing)    both ears   Hyperlipidemia    Hypertension    echo nl lv function  mild dilitation 2009   Kidney infection    few yrs ago in hospital   Medication side effect 09/02/2010   Poss muscle se of 10 crestor    Migraine    hypnic HA eval by Dr. 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    Pyelonephritis 03/12/2015   Sensation of pain in anesthetized distribution of trigeminal nerve    Syncope 02/01/2011   In shower on vacation  sustained head laceration  8 sutures Had ed visit neg head ct labs and x ray    Trigeminal neuralgia pain     SURGICAL HISTORY: Past Surgical History:  Procedure Laterality Date   ABDOMINAL HYSTERECTOMY  2002   tubal   BACK SURGERY     2 times, for sciatic nerve pain   BALLOON DILATION  06/21/2021   Procedure: DILATION BALLOON used for esophageal stricture;  Surgeon: Irving Copas., MD;  Location: [facility];  Service: Gastroenterology;;   CARDIAC CATHETERIZATION  2000   chest pains neg   CHOLECYSTECTOMY N/A 02/21/2017   Procedure: LAPAROSCOPIC CHOLECYSTECTOMY WITH INTRAOPERATIVE CHOLANGIOGRAM;  Surgeon: Armandina Gemma, MD;  Location: [facility];  Service: General;  Laterality: N/A;   COLONOSCOPY     multiple   CRANIOTOMY  12/09/2011   nerve decompression right trigeminal    DIRECT LARYNGOSCOPY N/A 02/12/2021   Procedure: DIRECT LARYNGOSCOPY AND BIOPSY POSSIBLE FROZEN;  Surgeon: [name], MD;  Location: [facility];  Service: ENT;  Laterality: N/A;   DOPPLER ECHOCARDIOGRAPHY  2009   nl lv function mild lv dilitation   ESOPHAGOGASTRODUODENOSCOPY (EGD) WITH PROPOFOL N/A 06/21/2021   Procedure: ESOPHAGOGASTRODUODENOSCOPY (EGD) WITH PROPOFOL;  Surgeon: Irving Copas., MD;  Location: [facility];  Service: Gastroenterology;  Laterality: N/A;  Request Fluoroscopy; Plan in for dilation    ESOPHAGOGASTRODUODENOSCOPY (EGD) WITH PROPOFOL N/A 07/19/2021   Procedure: ESOPHAGOGASTRODUODENOSCOPY (EGD) WITH PROPOFOL;  Surgeon: Rush Landmark Telford Nab., MD;  Location: [facility];  Service: Endoscopy;  Laterality: N/A;  Dilation fluoro   ESOPHAGOGASTRODUODENOSCOPY (EGD) WITH PROPOFOL N/A 08/05/2021   Procedure: ESOPHAGOGASTRODUODENOSCOPY (EGD) WITH PROPOFOL - fluoro;  Surgeon: Rush Landmark Telford Nab., MD;  Location: [facility];  Service: Gastroenterology;  Laterality: N/A;   ESOPHAGOGASTRODUODENOSCOPY (EGD) WITH PROPOFOL N/A 08/23/2021   Procedure: ESOPHAGOGASTRODUODENOSCOPY (EGD) WITH PROPOFOL;  Surgeon: [name], MD;  [facility]: [facility];  Service: Gastroenterology;  Laterality: N/A;  Using Fluoroscopy and esophageal dilation   ESOPHAGOGASTRODUODENOSCOPY (EGD) WITH PROPOFOL N/A 09/22/2021   Procedure: ESOPHAGOGASTRODUODENOSCOPY (EGD) WITH PROPOFOL;  Surgeon: [name], MD;  [facility];  Service: Gastroenterology;  Laterality: N/A;  EGD with savary and flour   ESOPHAGOGASTRODUODENOSCOPY (  EGD) WITH PROPOFOL N/A 12/07/2021   Procedure: ESOPHAGOGASTRODUODENOSCOPY (EGD) WITH PROPOFOL;  Surgeon: [name], MD;  [facility];  Service: Gastroenterology;  Laterality: N/A;  Schedule this with Fluroscopy, it is a savary dilation   EYE SURGERY Bilateral    ioc for catatracts   GASTROSTOMY N/A 02/26/2021   Procedure: OPEN GASTROSTOMY TUBE PLACEMENT;  Surgeon: Armandina Gemma, MD;  [facility];  Service: General;  Laterality: N/A;   IR GASTROSTOMY TUBE MOD SED  02/22/2021   IR REPLACE G-TUBE SIMPLE WO FLUORO  09/07/2021   laparoscopic gallbladder surgery  02/16/2017   Fax from [facility] Bilateral 2002   rt shoulder surgery     SAVORY DILATION N/A 06/21/2021   Procedure: SAVORY DILATION;  Surgeon: Irving Copas., MD;  Location: [facility];  Service: Gastroenterology;  Laterality: N/A;   SAVORY DILATION N/A 07/19/2021    Procedure: SAVORY DILATION;  Surgeon: Rush Landmark Telford Nab., MD;  Location[facility];  Service: Endoscopy;  Laterality: N/A;   SAVORY DILATION N/A 08/05/2021   Procedure: SAVORY DILATION;  Surgeon: Rush Landmark Telford Nab., MD;  Location: [facility];  Service: Gastroenterology;  Laterality: N/A;   SAVORY DILATION N/A 08/23/2021   Procedure: SAVORY DILATION;  Surgeon: [name], MD;  [facility]: [facility];  Service: Gastroenterology;  Laterality: N/A;   SAVORY DILATION N/A 09/22/2021   Procedure: SAVORY DILATION;  Surgeon: [name], MD;  [facility]: [facility];  Service: Gastroenterology;  Laterality: N/A;   SAVORY DILATION N/A 12/07/2021   Procedure: SAVORY DILATION;  Surgeon: [name], MD;  [facility];  Service: Gastroenterology;  Laterality: N/A;    I have reviewed the social history and family history with the patient and they are unchanged from previous note.  ALLERGIES:  is allergic to bactrim [sulfamethoxazole-trimethoprim] and hydrocodone.  MEDICATIONS:  Current Outpatient Medications  Medication Sig Dispense Refill   benazepril (LOTENSIN) 20 MG tablet TAKE 1 TABLET(20 MG) BY MOUTH TWICE DAILY 180 tablet 0   carvedilol (COREG) 25 MG tablet TAKE 1 TABLET(25 MG) BY MOUTH TWICE DAILY WITH A MEAL 60 tablet 3   conjugated estrogens (PREMARIN) vaginal cream Place 1 applicator vaginally once a week.     fluticasone (FLONASE) 50 MCG/ACT nasal spray Place 2 sprays into both nostrils as needed for allergies or rhinitis. 16 g 5   furosemide (LASIX) 20 MG tablet TAKE 1 TABLET(20 MG) BY MOUTH DAILY 90 tablet 0   guaiFENesin-codeine 100-10 MG/5ML syrup Place 5 mLs into feeding tube 2 (two) times daily as needed for cough. 236 mL 0   lamoTRIgine (LAMICTAL) 100 MG tablet Place 2.5 tablets (250 mg total) into feeding tube 2 (two) times daily. 150 tablet 1   loperamide (IMODIUM) 2 MG capsule 2 mg daily as needed for diarrhea or loose stools. Per tube     Multiple  Minerals-Vitamins (CAL-MAG-ZINC-D PO) Place 1 tablet into feeding tube 2 (two) times daily.     Multiple Vitamin (MULTIVITAMIN WITH MINERALS) TABS tablet Place 1 tablet into feeding tube in the morning.     Nutritional Supplements (NUTREN 1.5) LIQD Place 237 mLs into feeding tube in the morning and at bedtime.     omeprazole (PRILOSEC) 20 MG capsule 20 mg at bedtime. Per tube     ondansetron (ZOFRAN-ODT) 4 MG disintegrating tablet DISSOLVE 1 TO 2 TABLETS(4 TO 8 MG) ON THE TONGUE EVERY 8 HOURS AS NEEDED FOR NAUSEA OR VOMITING 30 tablet 1   Polyethyl Glycol-Propyl Glycol (  SYSTANE) 0.4-0.3 % GEL ophthalmic gel Place 1 application into both eyes 2 (two) times daily as needed (dry eyes).     prochlorperazine (COMPAZINE) 10 MG tablet TAKE 1 TABLET(10 MG) BY MOUTH EVERY 6 HOURS AS NEEDED FOR NAUSEA OR VOMITING (Patient taking differently: Take 10 mg by mouth every 6 (six) hours as needed for nausea or vomiting.) 30 tablet 1   sucralfate (CARAFATE) 1 GM/10ML suspension Take 10 mLs (1 g total) by mouth 4 (four) times daily -  with meals and at bedtime. 420 mL 5   No current facility-administered medications for this visit.    PHYSICAL EXAMINATION: ECOG PERFORMANCE STATUS: 2 - Symptomatic, <50% confined to bed  Vitals:   02/10/22 0914  BP: (!) 153/74  Pulse: 60  Resp: 18  Temp: 97.9 F (36.6 C)  SpO2: 97%   Wt Readings from Last 3 Encounters:  02/10/22 133 lb 6.4 oz (60.5 kg)  01/27/22 132 lb 11.2 oz (60.2 kg)  01/13/22 132 lb (59.9 kg)     GENERAL:alert, no distress and comfortable SKIN: skin color, texture, turgor are normal, no rashes or significant lesions EYES: normal, Conjunctiva are pink and non-injected, sclera clear OROPHARYNX:no exudate, no erythema, and buccal mucosa and tongue normal, (+) mild swelling to inner lips. Musculoskeletal:no cyanosis of digits and no clubbing  NEURO: alert & oriented x 3 with fluent speech, no focal motor/sensory deficits  LABORATORY DATA:  I have  reviewed the data as listed    Latest Ref Rng & Units 02/10/2022    8:59 AM 01/13/2022    9:50 AM 12/17/2021    9:27 AM  CBC  WBC 4.0 - 10.5 K/uL 4.5  3.9  4.6   Hemoglobin 12.0 - 15.0 g/dL 14.8  14.6  14.0   Hematocrit 36.0 - 46.0 % 44.5  44.1  43.1   Platelets 150 - 400 K/uL 148  160  172         Latest Ref Rng & Units 02/10/2022    8:59 AM 01/13/2022    9:50 AM 12/27/2021    3:16 PM  CMP  Glucose 70 - 99 mg/dL 215  108  86   BUN 8 - 23 mg/dL 20  26  30    Creatinine 0.44 - 1.00 mg/dL 0.99  0.89  0.97   Sodium 135 - 145 mmol/L 140  144  142   Potassium 3.5 - 5.1 mmol/L 3.9  4.0  4.4   Chloride 98 - 111 mmol/L 105  106  105   CO2 22 - 32 mmol/L 30  35  34   Calcium 8.9 - 10.3 mg/dL 9.5  10.4  10.4   Total Protein 6.5 - 8.1 g/dL 6.3  6.6    Total Bilirubin 0.3 - 1.2 mg/dL 0.9  0.7    Alkaline Phos 38 - 126 U/L 88  100    AST 15 - 41 U/L 25  22    ALT 0 - 44 U/L 18  16        RADIOGRAPHIC STUDIES: I have personally reviewed the radiological images as listed and agreed with the findings in the report. No results found.    Orders Placed This Encounter  Procedures   CT CHEST ABDOMEN PELVIS W CONTRAST    Standing Status:   Future    Standing Expiration Date:   02/11/2023    Order Specific Question:   Preferred imaging location?    Answer:   [facility]    Order Specific  Question:   Is Oral Contrast requested for this exam?    Answer:   Yes, Per Radiology protocol   CBC with Differential ([facility] Only)    Standing Status:   Future    Standing Expiration Date:   04/08/2023   CMP ([facility] only)    Standing Status:   Future    Standing Expiration Date:   04/08/2023   T4    Standing Status:   Future    Standing Expiration Date:   04/08/2023   TSH    Standing Status:   Future    Standing Expiration Date:   04/08/2023   CBC with Differential ([facility] Only)    Standing Status:   Future    Standing Expiration Date:   05/06/2023   CMP ([facility] only)    Standing Status:   Future    Standing Expiration Date:   05/06/2023   All questions were answered. The patient knows to call the clinic with any problems, questions or concerns. No barriers to learning was detected. The total time spent in the appointment was 30 minutes.     [name], MD 02/10/2022   I, Wilburn Mylar, am acting as scribe for [name], MD.   I have reviewed the above documentation for accuracy and completeness, and I agree with the above.

## 2022-02-10 NOTE — Progress Notes (Signed)
Nutrition follow-up completed with patient during infusion for esophageal cancer.  CURRENT THERAPY:  Nivolumab, started 05/21/21, currently q 4 weeks  Weight slightly improved and documented as 133 pounds 6.4 ounces September 21, increased from 131 pounds 9.6 ounces on August 7.  Patient has a feeding tube but does not use it for nutrition support.  Reports flushing her feeding tube daily with water.  Denies problems around site of the tube.  She is requesting to have feeding tube removed and oncologist has referred patient back to surgeon for removal.  Patient states she can eat anything she wants.  Denies nutrition impact symptoms.  Nutrition diagnosis: Inadequate intake improving.  Intervention: Continue strategies for increasing calories and protein to minimize weight loss. Continue daily water flushes and feeding tube care.  Monitoring, evaluation, goals: Patient will tolerate oral intake to minimize weight loss.  Next visit: To be scheduled as needed.  **Disclaimer: This note was dictated with voice recognition software. Similar sounding words can inadvertently be transcribed and this note may contain transcription errors which may not have been corrected upon publication of note.**

## 2022-02-11 ENCOUNTER — Telehealth: Payer: Self-pay | Admitting: Internal Medicine

## 2022-02-11 ENCOUNTER — Other Ambulatory Visit (HOSPITAL_COMMUNITY): Payer: Medicare Other

## 2022-02-11 DIAGNOSIS — R300 Dysuria: Secondary | ICD-10-CM

## 2022-02-11 NOTE — Telephone Encounter (Signed)
Pt stated that she wanted to be put on the list for an EGD with dilation: Pt is a Hospital Procedure: Pt stated that she is swallowing foods fine although she cannot swallow her pills the way that she would like: Pt stated that at last EGD that there was discussion about a repeat EGD with dilation in the future: Pt was made aware that Dr. Carlean Purl was going to be out of the office for several weeks and that I would send him a note to review and advise when he returns: Pt verbalized understanding with all questions answered.

## 2022-02-11 NOTE — Telephone Encounter (Signed)
Has vaginal infection previously and was told to call back if it returned, says it never completely went away. Seeking advise on what to do next

## 2022-02-14 ENCOUNTER — Other Ambulatory Visit: Payer: Self-pay | Admitting: Internal Medicine

## 2022-02-17 NOTE — Telephone Encounter (Signed)
Pt had OV with Dr Legrand Como on 01/27/22 for dysuria. Given abx at that time. Cephalexin sent in on 02/01/22.  Pt states dysuria & cloudy urine has resolved. Pt states she is having urinary urgency only.

## 2022-02-18 ENCOUNTER — Other Ambulatory Visit: Payer: Medicare Other

## 2022-02-18 DIAGNOSIS — R300 Dysuria: Secondary | ICD-10-CM

## 2022-02-18 NOTE — Telephone Encounter (Signed)
Please order a urine culture for her under the diagnosis of dysuria, she may come in anytime to give the sample

## 2022-02-18 NOTE — Telephone Encounter (Signed)
Spoke with the patient and scheduled a lab appt today at 3:40pm.

## 2022-02-18 NOTE — Addendum Note (Signed)
Addended by: Agnes Lawrence on: 02/18/2022 11:03 AM   Modules accepted: Orders

## 2022-02-19 DIAGNOSIS — M810 Age-related osteoporosis without current pathological fracture: Secondary | ICD-10-CM

## 2022-02-19 DIAGNOSIS — I10 Essential (primary) hypertension: Secondary | ICD-10-CM | POA: Diagnosis not present

## 2022-02-19 DIAGNOSIS — E785 Hyperlipidemia, unspecified: Secondary | ICD-10-CM

## 2022-02-20 LAB — URINE CULTURE
MICRO NUMBER:: 13987474
Result:: NO GROWTH
SPECIMEN QUALITY:: ADEQUATE

## 2022-02-20 NOTE — Progress Notes (Signed)
Urine culture negative, if she continues to have symptoms I would recommend referral to urology

## 2022-02-22 ENCOUNTER — Encounter: Payer: Self-pay | Admitting: Internal Medicine

## 2022-02-27 NOTE — Telephone Encounter (Signed)
Let her know I have her on the list and will work on this, schedule permitting

## 2022-02-28 NOTE — Telephone Encounter (Signed)
Left message for pt to call back  °

## 2022-03-01 NOTE — Telephone Encounter (Signed)
Pt was made aware of Dr. Carlean Purl recommendations:  Pt verbalized understanding with all questions answered.

## 2022-03-02 ENCOUNTER — Other Ambulatory Visit: Payer: Self-pay | Admitting: Adult Health

## 2022-03-10 ENCOUNTER — Inpatient Hospital Stay: Payer: Medicare Other

## 2022-03-10 ENCOUNTER — Other Ambulatory Visit: Payer: Self-pay

## 2022-03-10 ENCOUNTER — Inpatient Hospital Stay: Payer: Medicare Other | Attending: Nurse Practitioner

## 2022-03-10 VITALS — BP 148/80 | HR 62 | Temp 98.3°F | Resp 18 | Ht 66.0 in | Wt 134.0 lb

## 2022-03-10 DIAGNOSIS — Z5112 Encounter for antineoplastic immunotherapy: Secondary | ICD-10-CM | POA: Diagnosis not present

## 2022-03-10 DIAGNOSIS — C153 Malignant neoplasm of upper third of esophagus: Secondary | ICD-10-CM | POA: Diagnosis not present

## 2022-03-10 LAB — CMP (CANCER CENTER ONLY)
ALT: 18 U/L (ref 0–44)
AST: 24 U/L (ref 15–41)
Albumin: 3.8 g/dL (ref 3.5–5.0)
Alkaline Phosphatase: 82 U/L (ref 38–126)
Anion gap: 4 — ABNORMAL LOW (ref 5–15)
BUN: 26 mg/dL — ABNORMAL HIGH (ref 8–23)
CO2: 34 mmol/L — ABNORMAL HIGH (ref 22–32)
Calcium: 10 mg/dL (ref 8.9–10.3)
Chloride: 103 mmol/L (ref 98–111)
Creatinine: 0.93 mg/dL (ref 0.44–1.00)
GFR, Estimated: >60 mL/min (ref >60)
Glucose, Bld: 100 mg/dL — ABNORMAL HIGH (ref 70–99)
Potassium: 4.5 mmol/L (ref 3.5–5.1)
Sodium: 141 mmol/L (ref 135–145)
Total Bilirubin: 0.7 mg/dL (ref 0.3–1.2)
Total Protein: 6.3 g/dL — ABNORMAL LOW (ref 6.5–8.1)

## 2022-03-10 LAB — CBC WITH DIFFERENTIAL (CANCER CENTER ONLY)
Abs Immature Granulocytes: 0.01 10*3/uL (ref 0.00–0.07)
Basophils Absolute: 0 10*3/uL (ref 0.0–0.1)
Basophils Relative: 1 %
Eosinophils Absolute: 0.2 10*3/uL (ref 0.0–0.5)
Eosinophils Relative: 5 %
HCT: 43.5 % (ref 36.0–46.0)
Hemoglobin: 14.6 g/dL (ref 12.0–15.0)
Immature Granulocytes: 0 %
Lymphocytes Relative: 17 %
Lymphs Abs: 0.8 10*3/uL (ref 0.7–4.0)
MCH: 30.1 pg (ref 26.0–34.0)
MCHC: 33.6 g/dL (ref 30.0–36.0)
MCV: 89.7 fL (ref 80.0–100.0)
Monocytes Absolute: 0.4 10*3/uL (ref 0.1–1.0)
Monocytes Relative: 8 %
Neutro Abs: 3.3 10*3/uL (ref 1.7–7.7)
Neutrophils Relative %: 69 %
Platelet Count: 161 10*3/uL (ref 150–400)
RBC: 4.85 MIL/uL (ref 3.87–5.11)
RDW: 14.2 % (ref 11.5–15.5)
WBC Count: 4.8 10*3/uL (ref 4.0–10.5)
nRBC: 0 % (ref 0.0–0.2)

## 2022-03-10 MED ORDER — SODIUM CHLORIDE 0.9 % IV SOLN
480.0000 mg | Freq: Once | INTRAVENOUS | Status: AC
  Administered 2022-03-10: 480 mg via INTRAVENOUS
  Filled 2022-03-10: qty 48

## 2022-03-10 MED ORDER — SODIUM CHLORIDE 0.9 % IV SOLN: Freq: Once | INTRAVENOUS | Status: AC

## 2022-03-10 NOTE — Patient Instructions (Addendum)
[facility]COL[facility]   Discharge Instructions: Thank you for choosing [facility] to provide your oncology and hematology care.   If you have a lab appointment with the [facility], please go directly to the [facility] and check in at the registration area.   Wear comfortable clothing and clothing appropriate for easy access to any Portacath or PICC line.   We strive to give you quality time with your provider. You may need to reschedule your appointment if you arrive late (15 or more minutes).  Arriving late affects you and other patients whose appointments are after yours.  Also, if you miss three or more appointments without notifying the office, you may be dismissed from the clinic at the provider's discretion.      For prescription refill requests, have your pharmacy contact our office and allow 72 hours for refills to be completed.    Today you received the following chemotherapy and/or immunotherapy agents: Nivolumab (Opdivo)     To help prevent nausea and vomiting after your treatment, we encourage you to take your nausea medication as directed.  BELOW ARE SYMPTOMS THAT SHOULD BE REPORTED IMMEDIATELY: *FEVER GREATER THAN 100.4 F (38 C) OR HIGHER *CHILLS OR SWEATING *NAUSEA AND VOMITING THAT IS NOT CONTROLLED WITH YOUR NAUSEA MEDICATION *UNUSUAL SHORTNESS OF BREATH *UNUSUAL BRUISING OR BLEEDING *URINARY PROBLEMS (pain or burning when urinating, or frequent urination) *BOWEL PROBLEMS (unusual diarrhea, constipation, pain near the anus) TENDERNESS IN MOUTH AND THROAT WITH OR WITHOUT PRESENCE OF ULCERS (sore throat, sores in mouth, or a toothache) UNUSUAL RASH, SWELLING OR PAIN  UNUSUAL VAGINAL DISCHARGE OR ITCHING   Items with * indicate a potential emergency and should be followed up as soon as possible or go to the Emergency Department if any problems should occur.  Please show the CHEMOTHERAPY ALERT CARD or IMMUNOTHERAPY ALERT CARD at  check-in to the Emergency Department and triage nurse.  Should you have questions after your visit or need to cancel or reschedule your appointment, please contact [facility]  Dept: [phone]  and follow the prompts.  Office hours are 8:00 a.m. to 4:30 p.m. Monday - Friday. Please note that voicemails left after 4:00 p.m. may not be returned until the following business day.  We are closed weekends and major holidays. You have access to a nurse at all times for urgent questions. Please call the main number to the clinic Dept: [phone] and follow the prompts.   For any non-urgent questions, you may also contact your provider using MyChart. We now offer e-Visits for anyone 63 and older to request care online for non-urgent symptoms. For details visit [email or web address].   Also download the MyChart app! Go to the app store, search "MyChart", open the app, select [facility], and log in with your MyChart username and password.  Masks are optional in the cancer centers. If you would like for your care team to wear a mask while they are taking care of you, please let them know. You may have one support person who is at least 82 years old accompany you for your appointments.

## 2022-03-11 ENCOUNTER — Telehealth: Payer: Self-pay | Admitting: Internal Medicine

## 2022-03-11 NOTE — Telephone Encounter (Signed)
Please arrange EGD/savary dilation with fluoro at [name] Nov 14 or on a December date available  Dx esophageal stricture

## 2022-03-11 NOTE — Telephone Encounter (Unsigned)
Left message for pt to call back  °

## 2022-03-14 ENCOUNTER — Other Ambulatory Visit: Payer: Self-pay

## 2022-03-14 ENCOUNTER — Encounter: Payer: Self-pay | Admitting: Internal Medicine

## 2022-03-14 DIAGNOSIS — L600 Ingrowing nail: Secondary | ICD-10-CM

## 2022-03-14 DIAGNOSIS — K222 Esophageal obstruction: Secondary | ICD-10-CM

## 2022-03-14 NOTE — Telephone Encounter (Signed)
Pt was made aware of Dr. Carlean Purl recommendations: Pt was was scheduled for EGD/savary dilation with fluoro at [facility] Nov 14  at 10:15 AM: Pt to arrive at 8:45 AM: Pt made aware Case ID [id number] Prep Instructions were sent to pt via my chart    pt made aware: Ambulatory Referral to GI placed : Pt made aware: Pt verbalized understanding with all questions answered.

## 2022-03-15 NOTE — Telephone Encounter (Signed)
Please refer to [facility]  foot [facility] ankle

## 2022-03-15 NOTE — Telephone Encounter (Signed)
A referral is sent.

## 2022-03-16 ENCOUNTER — Other Ambulatory Visit: Payer: Self-pay

## 2022-03-16 ENCOUNTER — Telehealth: Payer: Self-pay | Admitting: Adult Health

## 2022-03-16 NOTE — Telephone Encounter (Signed)
Pt called stating that she is having tingling from the R side of her head all the way down to her Chin. Pt is needing to discuss with RN

## 2022-03-16 NOTE — Telephone Encounter (Addendum)
I returned patient's call.  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  She states she was able to wean off the oxcarbazepine and lamotrigine now down to total 300 mg/day.  She states since she has been unable to reach [facility] she decided to check back with Dr. Jaynee Eagles to see what she needed to do.  The whole time I talked to her she did not mention anything about her face tingling.  We discussed her medication.  She and her husband were both on the call.  He thinks the patient may have tried the Keppra crushed but the liquid bottle has not been touched. He thinks she probably stopped it in July.  I reminded him that Dr. Jaynee Eagles had referred the patient to [facility] back in June because at that time she did not know what else we could offer the patient.  He reports the patient feels much better off the oxcarbazepine and her mind is clearer.  She had an appointment that was scheduled in July with [facility].  He does not know what came of that but he said that she was not clear of mind on the oxcarbazepine so it is possible she could have canceled the appointment.  He is going to call [facility] to follow-up.  Overall he reports the patient's pain is under control however she is unable to wean any lower on the lamotrigine without return of pain.  She takes 100 mg QAM and 200 mg QHS.  I asked him about the tingling in her face.  He checked with the patient and said that the tingling is moving around her face it is on her lips and chin at this time.  He states that their daughter is an ER physician and she checked her over the weekend and felt like she was having a reaction to a beta-blocker as she reportedly had swollen, numb lips.  They took her off the medication and she improved however she is still left with tingling that is different than any she has felt before. Given the fact that we cannot  evaluate exactly what this is over the phone and I did not see where it was documented in her chart that tingling was routinely present with her pain, I did recommend that they take the patient to the ER for evaluation.  I reported to them that this tingling on the face could potentially be a stroke symptom.  He had the patient confirmed that she is not having any other symptoms such as tingling in any other places on her body or weakness or visual trouble.  He verbalized appreciation for the recommendation but he was not keen on waiting in the ER and said they would check with the daughter. I told him we could not force them to go but that would be our recommendation. He verbalized appreciation for the call.   I received a call a few minutes later from the RN at [facility], Lenna Sciara, and she said she was in the room with the patient who is not exhibiting any signs of stroke from her assessment.  She states she was able to walk with her walker, was not weak, grips good, pupils equal.  Her numbness apparently started yesterday in the cheek area under the eye and then now she has pins-and-needles under her cheek down to  her chin.  I did advise that numbness and tingling without weakness could still be a stroke and that although this may not be, we cannot tell for sure over the phone. I was able to talk to Dr. Felecia Shelling who said that if this is a new, significant, concerning symptom for the patient, it would not be unreasonable to go to the ER. If she is not concerned, we cannot force her to go. Message was relayed to the nurse and the patient. I will let Dr Jaynee Eagles know and follow-up tomorrow.

## 2022-03-17 ENCOUNTER — Encounter: Payer: Self-pay | Admitting: Neurology

## 2022-03-17 ENCOUNTER — Ambulatory Visit: Payer: Medicare Other | Admitting: Neurology

## 2022-03-17 ENCOUNTER — Telehealth: Payer: Self-pay | Admitting: Neurology

## 2022-03-17 ENCOUNTER — Encounter: Payer: Self-pay | Admitting: Hematology

## 2022-03-17 ENCOUNTER — Telehealth: Payer: Self-pay | Admitting: Internal Medicine

## 2022-03-17 VITALS — BP 180/101 | HR 63 | Ht 66.0 in | Wt 135.8 lb

## 2022-03-17 DIAGNOSIS — C760 Malignant neoplasm of head, face and neck: Secondary | ICD-10-CM

## 2022-03-17 DIAGNOSIS — R200 Anesthesia of skin: Secondary | ICD-10-CM | POA: Diagnosis not present

## 2022-03-17 DIAGNOSIS — R202 Paresthesia of skin: Secondary | ICD-10-CM | POA: Diagnosis not present

## 2022-03-17 DIAGNOSIS — R220 Localized swelling, mass and lump, head: Secondary | ICD-10-CM | POA: Diagnosis not present

## 2022-03-17 NOTE — Telephone Encounter (Signed)
[employer] medicare [name]: [phone] exp. 03/17/22-04/15/22 sent to [facility]

## 2022-03-17 NOTE — Telephone Encounter (Signed)
Contacted patient to follow up on her. Left a voicemail to call us back.

## 2022-03-17 NOTE — Patient Instructions (Addendum)
MRI brain

## 2022-03-17 NOTE — Telephone Encounter (Signed)
Patient stopped taking benazepril (LOTENSIN) 20 MG tablet and blood pressure has stayed down. Stopped because her lips became swollen. Says if you have any objections to please call her.

## 2022-03-17 NOTE — Progress Notes (Signed)
PATIENT: [name] DOB: 14-Mar-1941  REASON FOR VISIT: follow up HISTORY FROM: patient PRIMARY NEUROLOGIST:   HISTORY OF PRESENT ILLNESS:  03/17/2022: Patient with H&N cancer, we have been treating her for [name] and exhasuted most m Lovely 81year old femle with severe pain. She has sufferered with trigeminal neuralgia ([name]) since 2008, in 2013 had microvascular decompression at [facility] and did great for 9 years with recurrent [name](may need revision?). Very difficult case, patient had hyponatremia with Tegretol, tried her on Trileptal, baclofen,lamictal abd have worried about side effects of having multiple AEDs or baclofen in an 81 year old. tried nerve blocks . I'ver tried botox with other patients in the past with poor results, and risk of affecting CN 7,Had gamma knife as well. Other options include gamma knife, balloon, rhizotomy or others and referred to [facility]. Discussed at length with patient. She has been a poor medication taker, adjusting her dose without letting us know.Tried amitriptyline, decadron, amitriptyline, nerve blocks, keppra, nerve bkocks, muscle relaxers, nortriptyline, nycynta, trileptal,baclofen,oxy, tizanidine, cymbalta, topiramate, effexor,tramadol. Her last procedure was Gamma knife 12/2020 then at [facility] in 12/2021. She'Angela been to multiple neurosurgeons including [place] [place] [facility].  She thinks the cancer is under control. She couldn't eat for a while. She is having very different symptoms, the [name] seems to have reduced. Just on the lamictal now and doing well at 355m a day. [name] was always on the right but was always painful. Now having different symptoms more tingling in her cheek bone and above the right chin which is different. Not like trigeminal neuralgia.WOuld repeat MRi brain esp in the setting of head and neck cancer to rule out amy metastasis or spreading to the brain but this is likely remnants of trigaminal neuropathy/neuralgia.swelling in  the right side of the face as well.  Patient complains of symptoms per HPI as well as the following symptoms: facial numbness and swelling . Pertinent negatives and positives per HPI. All others negative  BUN 26, creat 0.93  Today 08/12/2021: [name] is an 81year old female with a history of Trigeminal Neuralgia. She returns today for follow-up. Reports that she is having breakthrough pain in the right jaw.  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  She states that she starts coughing for a long period of time the pain occurs. Unable to tolerate high dose of oxycarbamazepine.  She states that she does notices that she has been on these medications her balance is slightly off and she feels a little foggy headed.  Currently taking Lamictal 250 mg BID and Oxycarbamazine 150 mg BID  11/2/22[name] is a 81y.o. female with a history of trigeminal neuralgia here today for a follow up. Patient had Gamma Knife Procedure in August, she had some initial relief then she started having breakthrough pain. Having breakthrough TN episodes nearly daily, they are worse in the evenings. She states that she rarely gets it during sleep, and last night she had woken from her sleep with terrible pain.  Patient endorses that the oxcarbamazepine starting causing side effects such as mobility (initially using cane, now has to use walker), vision changes (diminished vision, double vision (line and dots) and cognitive issues. She stated when she had decreased her dose to 150 mg BID, she noted an increased improvement of all above and her ambulation has improved slightly as well. Her husband then said that due to breakthru pain he increased her lamotrigine to  266m po bid, they have done this for three days.   Patient would like to see Dr. SVertell Barrera unfortunately he is out of the office for the next 12 weeks.Encouraged patient to reach out to his office to determine if there is another  provider that specializes in those treatments in his office.   HISTORY  07/08/2020:   [facility] then also to increase lamotrigine to 2075mpo bid. She has breakthrough pain. It gets worse around 8pm. Just before that 8pm deadline it gets bad and when she eats it breaks through. She has an appointment with Dr. laArlan Organn 6/8. She has to bring all imaging. She had vascular decompression in 2013, lasted 9 years, may need revision?    Patient is here on concern of memory changes. She says in the last month she has gotten much better as far as the sciatica goes. She can walk around te house. She uses a caProgrammer, multimediaround the house. She did therapy last spring, and so she is trying to strengthen the muscles in her legs. 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 She is very lucid right now but in the morning she gets unstable and is confused. She can almost fall after she takes her medicine. She feels fantastic now on the oxcarb and lamictal.    MRI brain: No evidence of recent infarction, hemorrhage, or mass.  Stable postoperative changes of prior trigeminal nerve decompression.Stable mild chronic microvascular ischemic changes' . MRI cervical spine:  No abnormal cord signal.  Multilevel degenerative changes as detailed above. No high-grade canal stenosis. Foraminal narrowing is greatest on the left at C5-C6.   TSH 0.88, B12 933,    Patient complains of symptoms per HPI as well as the following symptoms: imbalance, falls . Pertinent negatives and positives per HPI. All others negative   05/11/2020: She is taking more Lamictal. She had the procedure with Dr. StVertell Limberher right face was numb for some time, there ois discomfort now, once in a while she gets the  lightning strike, the procedure made a huge difference. She is on 10011mwice a day of the Lamictal.    [name] a 80 33o. female here as requested by [name], WanStandley BrookingD for Trigeminal neuralgia. 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 EdeMart Piggs the past, remote kidney infection, hypertension, hyperlipidemia, hard of hearing, fatty liver, anxiety and depression.  I reviewed Dr. SteMelven Sartoriustes, she was seen there for evaluation of bilateral leg pain, increased leg pain left greater than right 6 months, weakness in both legs, she had epidural steroid injections x3, most recently left L4-L5 in November with no relief, numerous medications trialed most recently Cymbalta, Tylenol, Motrin.  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 Reviewed Dr. [name] notes, patient sodium was affected on Tegretol.    She had surgery at [facility] in 2013 for trigeminal neuralgia, she was told hopefully the surgery would help but since then she had been increasing the medication and increased to 1245m a day and was decreased due to blood work and side effects, and now she has stopped the carbamazepine. She was  getting side effects from the carbamazepine but it was helping. Dr. SVertell Limberplaced her on Oxcarbazepine(trileptal) and now she is on cymbalta, she started the Trileptal a few weeks ago. She was started on 3037mand just a few days ago she was increased to 60010mwice a day(1200m18mday). She is not taking the cymbalta, was likely prescribed for low back pain (Dr. NewtErnestina Patcheshe pain starts on the right and shoots down her jaw, chewing, talking makes it worse, severe, shooting, same pain she has always experienced. The pain is severe today. She says she is unstable and tired, gets better after 45 minutes. When she did the clock test she could not draw the clock. No coordination. All the numbers were in sequence. She could not draw.      REVIEW OF SYSTEMS: Out of a complete 14 system review of symptoms, the patient complains only of the following symptoms, and all other reviewed systems are negative.  ALLERGIES: Allergies  Allergen Reactions   Bactrim [Sulfamethoxazole-Trimethoprim] Hives and Itching   Hydrocodone Other (See Comments)    Rebound headaches    HOME MEDICATIONS: Outpatient Medications Prior to Visit  Medication Sig Dispense Refill   carvedilol (COREG) 25 MG tablet TAKE 1 TABLET(25 MG) BY MOUTH TWICE DAILY WITH A MEAL 60 tablet 3   conjugated estrogens (PREMARIN) vaginal cream Place 1 applicator vaginally once a week.     Ferrous Sulfate (IRON PO) Take 1 tablet by mouth daily.     fluticasone (FLONASE) 50 MCG/ACT nasal spray Place 2 sprays into both nostrils as needed for allergies or rhinitis. 16 g 5   furosemide (LASIX) 20 MG tablet TAKE 1 TABLET(20 MG) BY MOUTH DAILY 90 tablet 0   guaiFENesin-codeine 100-10 MG/5ML syrup Place 5 mLs into feeding tube 2 (two) times daily as needed for cough. 236 mL 0   lamoTRIgine (LAMICTAL) 100 MG tablet PLACE 2.5 TABLET INTO FEEDING TUBE 2 TIMES DAILY. (Patient taking differently: Pt states that she is taking 1 in the AM and 2 at night) 150 tablet 1    loperamide (IMODIUM) 2 MG capsule 2 mg daily as needed for diarrhea or loose stools. Per tube     Multiple Minerals-Vitamins (CAL-MAG-ZINC-D PO) Place 1 tablet into feeding tube 2 (two) times daily.     Multiple Vitamin (MULTIVITAMIN WITH MINERALS) TABS tablet Place 1 tablet into feeding tube in the morning.     mupirocin ointment (BACTROBAN) 2 % APPLY TOPICALLY TO THE AFFECTED AREA TWICE DAILY     Nutritional Supplements (NUTREN 1.5) LIQD Place 237 mLs into feeding tube in the morning and at bedtime.     omeprazole (PRILOSEC) 20 MG capsule 20 mg at bedtime. Per tube     ondansetron (ZOFRAN-ODT) 4 MG disintegrating tablet DISSOLVE 1 TO 2 TABLETS(4 TO 8 MG) ON THE TONGUE EVERY 8 HOURS AS NEEDED FOR NAUSEA OR VOMITING 30 tablet 1   Polyethyl Glycol-Propyl Glycol (SYSTANE) 0.4-0.3 %  GEL ophthalmic gel Place 1 application into both eyes 2 (two) times daily as needed (dry eyes).     prochlorperazine (COMPAZINE) 10 MG tablet TAKE 1 TABLET(10 MG) BY MOUTH EVERY 6 HOURS AS NEEDED FOR NAUSEA OR VOMITING (Patient taking differently: Take 10 mg by mouth every 6 (six) hours as needed for nausea or vomiting.) 30 tablet 1   sucralfate (CARAFATE) 1 GM/10ML suspension Take 10 mLs (1 g total) by mouth 4 (four) times daily -  with meals and at bedtime. 420 mL 5   benazepril (LOTENSIN) 20 MG tablet TAKE 1 TABLET(20 MG) BY MOUTH TWICE DAILY (Patient not taking: Reported on 03/17/2022) 180 tablet 0   levETIRAcetam (KEPPRA) 100 MG/ML solution PLACE 2.5 ML INTO FEEDING TUBE TWICE DAILY (Patient not taking: Reported on 03/17/2022)     No facility-administered medications prior to visit.    PAST MEDICAL HISTORY: Past Medical History:  Diagnosis Date   Abdominal pain 05/29/2013   Angela/p rx of cephalo resistant e coli   but last rx NG  now residular ?  bladder sx repeat cx sx rx to ty and uro consult    ADJ DISORDER WITH MIXED ANXIETY & DEPRESSED MOOD 03/03/2010   Qualifier: Diagnosis of  By: [name], [name]    Agent  resistant to multiple antibiotics 05/29/2013   e coli   bu NG on fu.     Anemia    Anxiety    ARF (acute renal failure) (HCC) 03/12/2015   Closed head injury 02/01/2011   from syncope and had scalp laceration  neg ct .     Closed head injury 5-6 yrs ago   Colitis 11/94/1740   Complication of anesthesia    migraine several hours after general anesthesia   Depression    esophageal ca 01/2021   Fatty liver    Gall stones 2016   see ct scan neg [facility]IDA    GERD (gastroesophageal reflux disease)    Hearing aid worn    HOH (hard of hearing)    both ears   Hyperlipidemia    Hypertension    echo nl lv function  mild dilitation 2009   Kidney infection    few yrs ago in hospital   Medication side effect 09/02/2010   Poss muscle se of 10 crestor    Migraine    hypnic HA eval by Dr. 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    Pyelonephritis 03/12/2015   Sensation of pain in anesthetized distribution of trigeminal nerve    Syncope 02/01/2011   In shower on vacation  sustained head laceration  8 sutures Had ed visit neg head ct labs and x ray    Trigeminal neuralgia pain     PAST SURGICAL HISTORY: Past Surgical History:  Procedure Laterality Date   ABDOMINAL HYSTERECTOMY  2002   tubal   BACK SURGERY     2 times, for sciatic nerve pain   BALLOON DILATION  06/21/2021   Procedure: DILATION BALLOON used for esophageal stricture;  Surgeon: Irving Copas., MD;  Location: [facility];  Service: Gastroenterology;;   CARDIAC CATHETERIZATION  2000   chest pains neg   CHOLECYSTECTOMY N/A 02/21/2017   Procedure: LAPAROSCOPIC CHOLECYSTECTOMY WITH INTRAOPERATIVE CHOLANGIOGRAM;  Surgeon: Armandina Gemma, MD;  Location: [facility];  Service: General;  Laterality: N/A;   COLONOSCOPY     multiple   CRANIOTOMY  12/09/2011   nerve decompression right trigeminal  DIRECT LARYNGOSCOPY N/A 02/12/2021   Procedure: DIRECT LARYNGOSCOPY AND BIOPSY POSSIBLE FROZEN;  Surgeon:  [name], MD;  Location: [place];  Service: ENT;  Laterality: N/A;   DOPPLER ECHOCARDIOGRAPHY  2009   nl lv function mild lv dilitation   ESOPHAGOGASTRODUODENOSCOPY (EGD) WITH PROPOFOL N/A 06/21/2021   Procedure: ESOPHAGOGASTRODUODENOSCOPY (EGD) WITH PROPOFOL;  Surgeon: Irving Copas., MD;  Location: [facility];  Service: Gastroenterology;  Laterality: N/A;  Request Fluoroscopy; Plan in for dilation   ESOPHAGOGASTRODUODENOSCOPY (EGD) WITH PROPOFOL N/A 07/19/2021   Procedure: ESOPHAGOGASTRODUODENOSCOPY (EGD) WITH PROPOFOL;  Surgeon: Rush Landmark Telford Nab., MD;  Location: [facility];  Service: Endoscopy;  Laterality: N/A;  Dilation fluoro   ESOPHAGOGASTRODUODENOSCOPY (EGD) WITH PROPOFOL N/A 08/05/2021   Procedure: ESOPHAGOGASTRODUODENOSCOPY (EGD) WITH PROPOFOL - fluoro;  Surgeon: Rush Landmark Telford Nab., MD;  Location: [place];  Service: Gastroenterology;  Laterality: N/A;   ESOPHAGOGASTRODUODENOSCOPY (EGD) WITH PROPOFOL N/A 08/23/2021   Procedure: ESOPHAGOGASTRODUODENOSCOPY (EGD) WITH PROPOFOL;  Surgeon: [name], MD;  [facility];  Service: Gastroenterology;  Laterality: N/A;  Using Fluoroscopy and esophageal dilation   ESOPHAGOGASTRODUODENOSCOPY (EGD) WITH PROPOFOL N/A 09/22/2021   Procedure: ESOPHAGOGASTRODUODENOSCOPY (EGD) WITH PROPOFOL;  Surgeon: [name], MD;  [facility];  Service: Gastroenterology;  Laterality: N/A;  EGD with savary and flour   ESOPHAGOGASTRODUODENOSCOPY (EGD) WITH PROPOFOL N/A 12/07/2021   Procedure: ESOPHAGOGASTRODUODENOSCOPY (EGD) WITH PROPOFOL;  Surgeon: [name], MD;  [facility]: [facility];  Service: Gastroenterology;  Laterality: N/A;  Schedule this with Fluroscopy, it is a savary dilation   EYE SURGERY Bilateral    ioc for catatracts   GASTROSTOMY N/A 02/26/2021   Procedure: OPEN GASTROSTOMY TUBE PLACEMENT;  Surgeon: Armandina Gemma, MD;  Location: [facility];  Service: General;  Laterality:  N/A;   IR GASTROSTOMY TUBE MOD SED  02/22/2021   IR GASTROSTOMY TUBE REMOVAL  02/10/2022   IR REPLACE G-TUBE SIMPLE WO FLUORO  09/07/2021   laparoscopic gallbladder surgery  02/16/2017   Fax from [place] Bilateral 2002   rt shoulder surgery     SAVORY DILATION N/A 06/21/2021   Procedure: SAVORY DILATION;  Surgeon: Irving Copas., MD;  Location: [facility];  Service: Gastroenterology;  Laterality: N/A;   SAVORY DILATION N/A 07/19/2021   Procedure: SAVORY DILATION;  Surgeon: Rush Landmark Telford Nab., MD;  [facility];  Service: Endoscopy;  Laterality: N/A;   SAVORY DILATION N/A 08/05/2021   Procedure: SAVORY DILATION;  Surgeon: Rush Landmark Telford Nab., MD;  Location: [place];  Service: Gastroenterology;  Laterality: N/A;   SAVORY DILATION N/A 08/23/2021   Procedure: SAVORY DILATION;  Surgeon: [name], MD;  [facility];  Service: Gastroenterology;  Laterality: N/A;   SAVORY DILATION N/A 09/22/2021   Procedure: SAVORY DILATION;  Surgeon: [name], MD;  [facility];  Service: Gastroenterology;  Laterality: N/A;   SAVORY DILATION N/A 12/07/2021   Procedure: SAVORY DILATION;  Surgeon: [name], MD;  [facility];  Service: Gastroenterology;  Laterality: N/A;    FAMILY HISTORY: Family History  Problem Relation Age of Onset   Cancer Mother        uterine   Ovarian cancer Mother    Stroke Mother    Alcohol abuse Father    Stroke Father    Diabetes Brother    Cancer Paternal Aunt        leukemia, unknown type   Seizures Daughter    Hypertension Other    Colon  cancer Neg Hx    Dementia Neg Hx    Alzheimer'Angela disease Neg Hx     SOCIAL HISTORY: Social History   Socioeconomic History   Marital status: Married    Spouse name: Not on file   Number of children: 2   Years of education: Not on file   Highest education level: Not on file  Occupational History    Comment: retired Management consultant  Tobacco Use   Smoking status: Never   Smokeless tobacco: Never  Vaping Use   Vaping Use: Never used  Substance and Sexual Activity   Alcohol use: Not Currently    Alcohol/week: 2.0 standard drinks of alcohol    Types: 2 Glasses of wine per week    Comment: occ wine   Drug use: No   Sexual activity: Not on file  Other Topics Concern   Not on file  Social History Narrative   Married   HH of 2-3 (god daughter)   Pets 2 dogs   Non smoker    Child is a Engineer, drilling   G2P2      Caffeine: 2 cups/day   Social Determinants of Health   Financial Resource Strain: Low Risk  (02/09/2022)   Overall Financial Resource Strain (CARDIA)    Difficulty of Paying Living Expenses: Not very hard  Food Insecurity: No Food Insecurity (04/06/2021)   Hunger Vital Sign    Worried About Running Out of Food in the Last Year: Never true    Ran Out of Food in the Last Year: Never true  Transportation Needs: No Transportation Needs (04/06/2021)   PRAPARE - Hydrologist (Medical): No    Lack of Transportation (Non-Medical): No  Physical Activity: Sufficiently Active (04/06/2021)   Exercise Vital Sign    Days of Exercise per Week: 2 days    Minutes of Exercise per Session: 150+ min  Stress: No Stress Concern Present (04/06/2021)   Letts    Feeling of Stress : Not at all  Social Connections: Moderately Integrated (03/25/2020)   Social Connection and Isolation Panel [NHANES]    Frequency of Communication with Friends and Family: More than three times a week    Frequency of Social Gatherings with Friends and Family: Once a week    Attends Religious Services: 1 to 4 times per year    Active Member of [employer] or Organizations: No    Attends Archivist Meetings: Never    Marital Status: Married  Human resources officer Violence: Not At Risk (03/25/2020)   Humiliation, Afraid, Rape, and Kick  questionnaire    Fear of Current or Ex-Partner: No    Emotionally Abused: No    Physically Abused: No    Sexually Abused: No      PHYSICAL EXAM  Exam: NAD, pleasant        CV: RRR, some distal mild edema and rubor distally           Speech:    Speech is normal; fluent and spontaneous with normal comprehension.  Cognition:    The patient is oriented to person, place, and time;     recent and remote memory intact;     language fluent;    Cranial Nerves:    The pupils are equal, round, and reactive to light.numbness right side of face.  The face is symmetric. The palate elevates in the midline. Hearing intact. Voice is normal. Shoulder shrug is normal.  The tongue has normal motion without fasciculations. Vision intact to confrontation,  Coordination:  No dysmetria, norma FTN  Motor Observation:    No asymmetry, no atrophy, and no involuntary movements noted. Tone:    Normal muscle tone.     Strength:    Strength is V/V in the upper and lower limbs.      Sensation: intact to LT  DTRS: absent AJ otherwise brisk but symmetrical    DIAGNOSTIC DATA (LABS, IMAGING, TESTING) - I reviewed patient records, labs, notes, testing and imaging myself where available.  Lab Results  Component Value Date   WBC 4.8 03/10/2022   HGB 14.6 03/10/2022   HCT 43.5 03/10/2022   MCV 89.7 03/10/2022   PLT 161 03/10/2022      Component Value Date/Time   NA 141 03/10/2022 1259   NA 136 03/24/2021 1508   K 4.5 03/10/2022 1259   CL 103 03/10/2022 1259   CO2 34 (H) 03/10/2022 1259   GLUCOSE 100 (H) 03/10/2022 1259   GLUCOSE 97 03/17/2006 0820   BUN 26 (H) 03/10/2022 1259   BUN 26 03/24/2021 1508   CREATININE 0.93 03/10/2022 1259   CALCIUM 10.0 03/10/2022 1259   PROT 6.3 (L) 03/10/2022 1259   PROT 6.2 03/24/2021 1508   ALBUMIN 3.8 03/10/2022 1259   ALBUMIN 4.2 03/24/2021 1508   AST 24 03/10/2022 1259   ALT 18 03/10/2022 1259   ALKPHOS 82 03/10/2022 1259   BILITOT 0.7 03/10/2022  1259   GFRNONAA >60 03/10/2022 1259   GFRAA 62 07/08/2020 1653   Lab Results  Component Value Date   CHOL 279 (H) 05/27/2019   HDL 118.80 05/27/2019   LDLCALC 145 (H) 05/27/2019   LDLDIRECT 135.9 10/16/2012   TRIG 78.0 05/27/2019   CHOLHDL 2 05/27/2019   Lab Results  Component Value Date   HGBA1C 5.2 11/09/2021   Lab Results  Component Value Date   ZLDJTTSV77 939 (H) 07/01/2020   Lab Results  Component Value Date   TSH 1.697 01/13/2022    Exam: NAD, pleasant                  Speech:    Speech is normal; fluent and spontaneous with normal comprehension.  Cognition:    The patient is oriented to person, place, and time;     recent and remote memory intact;     language fluent;    Cranial Nerves:    The pupils are equal, round, and reactive to light.Trigeminal sensation is intact and the muscles of mastication are normal. The face is symmetric. The palate elevates in the midline. Hearing intact. Voice is normal. Shoulder shrug is normal. The tongue has normal motion without fasciculations.   Coordination:  No dysmetria  Motor Observation:    No asymmetry, no atrophy, and no involuntary movements noted. Tone:    Normal muscle tone.     Strength:    Strength is V/V in the upper and lower limbs.      Sensation: intact to LT    ASSESSMENT AND PLAN 81 y.o. year old female  has a past medical history of Abdominal pain (05/29/2013), ADJ DISORDER WITH MIXED ANXIETY & DEPRESSED MOOD (03/03/2010), Agent resistant to multiple antibiotics (05/29/2013), Anemia, Anxiety, ARF (acute renal failure) ([place]) (03/12/2015), Closed head injury (02/01/2011), Closed head injury (5-6 yrs ago), Colitis (03/00/9233), Complication of anesthesia, Depression, esophageal ca (01/2021), Fatty liver, Gall stones (2016), GERD (gastroesophageal reflux disease), Hearing aid worn, HOH (hard of hearing), Hyperlipidemia, Hypertension, Kidney  infection, Medication side effect (09/02/2010), Migraine,  Polycythemia, Positive PPD, Pyelonephritis (03/12/2015), Sensation of pain in anesthetized distribution of trigeminal nerve, Syncope (02/01/2011), and Trigeminal neuralgia pain. here with:  New facial symptoms: She is having very different symptoms, the [name] seems to have reduced. Just on the lamictal now and doing well at 328m a day. [name] was always on the right but was always painful. Now having different symptoms more tingling in her cheek bone and above the right chin which is different. Not like trigeminal neuralgia.WOuld repeat MRi brain esp in the setting of head and neck cancer to rule out amy metastasis or spreading to the brain or stroke but this is likely remnants of trigaminal neuropathy/neuralgia.swelling in the right side of the face as well. Last gamma knife with Dr. LArlan Organwas 12/2021.   Trigeminal Neuralgia: improved : continue Lamotrigine, doing better since gamma knife 12/2021, hold at current dose for now   3. F/u 4 months and it stable can reduce the lamictal at that time but RFA is not instantaneous may need to follow for several months  Orders Placed This Encounter  Procedures   MR BRAIN W WO CONTRAST     ASarina IllMD [facility] [address] [place] [phone]   I spent 30 minutes of face-to-face and non-face-to-face time with patient on the  1. Numbness and tingling of right face   2. Swelling of right side of face   3. [place] and neck [place] ([place]    diagnosis.  This included previsit chart review, lab review, study review, order entry, electronic health record documentation, patient education on the different diagnostic and therapeutic options, counseling and coordination of care, risks and benefits of management, compliance, or risk factor reduction

## 2022-03-17 NOTE — Telephone Encounter (Signed)
I updated Dr Jaynee Eagles. I called the pt and back. She will see Dr Jaynee Eagles at 1 pm today. She is not currently experiencing the symptoms but feels she needs to be seen.

## 2022-03-21 ENCOUNTER — Other Ambulatory Visit: Payer: Self-pay

## 2022-03-21 DIAGNOSIS — R1310 Dysphagia, unspecified: Secondary | ICD-10-CM

## 2022-03-21 DIAGNOSIS — K222 Esophageal obstruction: Secondary | ICD-10-CM

## 2022-03-23 NOTE — Telephone Encounter (Signed)
Spoke to patient. Denied adverse reaction. Schedule her physical exam and to discuss with medication on 04/18/2022

## 2022-03-28 ENCOUNTER — Encounter (HOSPITAL_COMMUNITY): Payer: Self-pay | Admitting: Internal Medicine

## 2022-03-28 ENCOUNTER — Ambulatory Visit: Payer: Medicare Other | Admitting: Podiatry

## 2022-03-28 ENCOUNTER — Ambulatory Visit (HOSPITAL_COMMUNITY)
Admission: RE | Admit: 2022-03-28 | Discharge: 2022-03-28 | Disposition: A | Discharge status: Home / Self Care | Payer: Medicare Other | Source: Ambulatory Visit | Attending: Neurology | Admitting: Neurology

## 2022-03-28 DIAGNOSIS — C760 Malignant neoplasm of head, face and neck: Secondary | ICD-10-CM | POA: Diagnosis not present

## 2022-03-28 DIAGNOSIS — R200 Anesthesia of skin: Secondary | ICD-10-CM | POA: Diagnosis not present

## 2022-03-28 DIAGNOSIS — R220 Localized swelling, mass and lump, head: Secondary | ICD-10-CM | POA: Insufficient documentation

## 2022-03-28 DIAGNOSIS — R202 Paresthesia of skin: Secondary | ICD-10-CM | POA: Diagnosis not present

## 2022-03-28 MED ORDER — GADOBUTROL 1 MMOL/ML IV SOLN
6.0000 mL | Freq: Once | INTRAVENOUS | Status: AC | PRN
  Administered 2022-03-28: 6 mL via INTRAVENOUS

## 2022-03-28 NOTE — Progress Notes (Signed)
Attempted to obtain medical history via telephone, unable to reach at this time. HIPAA compliant voicemail message left requesting return call to pre surgical testing department. 

## 2022-03-29 ENCOUNTER — Encounter (HOSPITAL_COMMUNITY): Payer: Self-pay | Admitting: Internal Medicine

## 2022-03-29 ENCOUNTER — Telehealth: Payer: Self-pay

## 2022-03-29 NOTE — Telephone Encounter (Signed)
Patient returning your call.

## 2022-03-29 NOTE — Telephone Encounter (Signed)
Pt stated that her husband notified that a nurse has called her. Stating that it was a female nurse: Chart reviewed. Unsure who called pt:

## 2022-03-30 ENCOUNTER — Ambulatory Visit: Payer: Medicare Other | Admitting: Podiatry

## 2022-03-30 ENCOUNTER — Encounter: Payer: Self-pay | Admitting: Podiatry

## 2022-03-30 DIAGNOSIS — L600 Ingrowing nail: Secondary | ICD-10-CM

## 2022-03-30 MED ORDER — NEOMYCIN-POLYMYXIN-HC 3.5-10000-1 OT SUSP: OTIC | 0 refills | Status: DC

## 2022-03-30 NOTE — Patient Instructions (Signed)

## 2022-03-30 NOTE — Progress Notes (Addendum)
  Subjective:  Patient ID: [name], female    DOB: [birth year],  MRN: [id number]  Chief Complaint  Patient presents with   Nail Problem    NP - Right painful toenail - feels ingrown - pincer nail deformity    81 y.o. female presents with the above complaint. History confirmed with patient.   Objective:  Physical Exam: warm, good capillary refill, no trophic changes or ulcerative lesions, normal DP and PT pulses, normal sensory exam, and ingrown pincer nail deformity no infection.  Assessment:   1. Ingrowing right great toenail      Plan:  Patient was evaluated and treated and all questions answered.    Ingrown Nail, right -Patient elects to proceed with minor surgery to remove ingrown toenail today. Consent reviewed and signed by patient. -Ingrown nail excised. See procedure note. -Educated on post-procedure care including soaking. Written instructions provided and reviewed. -Rx for Cortisporin sent to pharmacy. -Advised on signs and symptoms of infection developing.  We discussed that the phenol likely will create some redness and edema and tenderness around the nailbed as long as it is localized this is to be expected.  Will return as needed if any infection signs develop.  She will be seen in 3 weeks on the nurse schedule to reevaluate the toenail  Procedure: Excision of Ingrown Toenail Location: Right 1st toe lateral nail borders. Anesthesia: Lidocaine 1% plain; 1.5 mL and Marcaine 0.5% plain; 1.5 mL, digital block. Skin Prep: Betadine. Dressing: Silvadene; telfa; dry, sterile, compression dressing. Technique: Following skin prep, the toe was exsanguinated and a tourniquet was secured at the base of the toe. The affected nail border was freed, split with a nail splitter, and excised. Chemical matrixectomy was then performed with phenol and irrigated out with alcohol. The tourniquet was then removed and sterile dressing applied. Disposition: Patient tolerated  procedure well.    Return in about 3 weeks (around 04/20/2022) for nail re-check.

## 2022-04-02 ENCOUNTER — Other Ambulatory Visit: Payer: Self-pay | Admitting: Internal Medicine

## 2022-04-04 ENCOUNTER — Encounter: Payer: Self-pay | Admitting: Neurology

## 2022-04-05 ENCOUNTER — Ambulatory Visit (HOSPITAL_BASED_OUTPATIENT_CLINIC_OR_DEPARTMENT_OTHER): Payer: Medicare Other | Admitting: Anesthesiology

## 2022-04-05 ENCOUNTER — Other Ambulatory Visit: Payer: Self-pay

## 2022-04-05 ENCOUNTER — Ambulatory Visit (HOSPITAL_COMMUNITY)
Admission: RE | Admit: 2022-04-05 | Discharge: 2022-04-05 | Disposition: A | Discharge status: Home / Self Care | Payer: Medicare Other | Source: Ambulatory Visit | Attending: Internal Medicine | Admitting: Internal Medicine

## 2022-04-05 ENCOUNTER — Encounter (HOSPITAL_COMMUNITY)
Admission: RE | Disposition: A | Discharge status: Home / Self Care | Payer: Self-pay | Source: Ambulatory Visit | Attending: Internal Medicine

## 2022-04-05 ENCOUNTER — Ambulatory Visit (HOSPITAL_COMMUNITY): Payer: Medicare Other | Admitting: Anesthesiology

## 2022-04-05 ENCOUNTER — Encounter (HOSPITAL_COMMUNITY): Payer: Self-pay | Admitting: Internal Medicine

## 2022-04-05 ENCOUNTER — Ambulatory Visit (HOSPITAL_COMMUNITY): Payer: Medicare Other

## 2022-04-05 DIAGNOSIS — R1310 Dysphagia, unspecified: Secondary | ICD-10-CM | POA: Diagnosis not present

## 2022-04-05 DIAGNOSIS — K219 Gastro-esophageal reflux disease without esophagitis: Secondary | ICD-10-CM | POA: Insufficient documentation

## 2022-04-05 DIAGNOSIS — F32A Depression, unspecified: Secondary | ICD-10-CM | POA: Diagnosis not present

## 2022-04-05 DIAGNOSIS — Z79899 Other long term (current) drug therapy: Secondary | ICD-10-CM | POA: Diagnosis not present

## 2022-04-05 DIAGNOSIS — K222 Esophageal obstruction: Secondary | ICD-10-CM | POA: Insufficient documentation

## 2022-04-05 DIAGNOSIS — I10 Essential (primary) hypertension: Secondary | ICD-10-CM | POA: Diagnosis not present

## 2022-04-05 DIAGNOSIS — M1990 Unspecified osteoarthritis, unspecified site: Secondary | ICD-10-CM | POA: Insufficient documentation

## 2022-04-05 DIAGNOSIS — F419 Anxiety disorder, unspecified: Secondary | ICD-10-CM | POA: Diagnosis not present

## 2022-04-05 HISTORY — PX: SAVORY DILATION: SHX5439

## 2022-04-05 HISTORY — PX: ESOPHAGOGASTRODUODENOSCOPY (EGD) WITH PROPOFOL: SHX5813

## 2022-04-05 SURGERY — ESOPHAGOGASTRODUODENOSCOPY (EGD) WITH PROPOFOL
Anesthesia: General

## 2022-04-05 MED ORDER — ONDANSETRON HCL 4 MG/2ML IJ SOLN
INTRAMUSCULAR | Status: DC | PRN
  Administered 2022-04-05: 4 mg via INTRAVENOUS

## 2022-04-05 MED ORDER — FENTANYL CITRATE (PF) 100 MCG/2ML IJ SOLN
INTRAMUSCULAR | Status: AC
  Filled 2022-04-05: qty 2

## 2022-04-05 MED ORDER — SODIUM CHLORIDE 0.9 % IV SOLN: INTRAVENOUS | Status: DC

## 2022-04-05 MED ORDER — LACTATED RINGERS IV SOLN
INTRAVENOUS | Status: AC | PRN
  Administered 2022-04-05: 1000 mL via INTRAVENOUS

## 2022-04-05 MED ORDER — ROCURONIUM BROMIDE 10 MG/ML (PF) SYRINGE
PREFILLED_SYRINGE | INTRAVENOUS | Status: DC | PRN
  Administered 2022-04-05: 60 mg via INTRAVENOUS

## 2022-04-05 MED ORDER — PROPOFOL 10 MG/ML IV BOLUS
INTRAVENOUS | Status: DC | PRN
  Administered 2022-04-05: 140 mg via INTRAVENOUS

## 2022-04-05 MED ORDER — LACTATED RINGERS IV SOLN: INTRAVENOUS | Status: DC | PRN

## 2022-04-05 MED ORDER — SUGAMMADEX SODIUM 500 MG/5ML IV SOLN
INTRAVENOUS | Status: DC | PRN
  Administered 2022-04-05: 250 mg via INTRAVENOUS

## 2022-04-05 MED ORDER — LIDOCAINE 2% (20 MG/ML) 5 ML SYRINGE
INTRAMUSCULAR | Status: DC | PRN
  Administered 2022-04-05: 60 mg via INTRAVENOUS

## 2022-04-05 MED ORDER — DEXAMETHASONE SODIUM PHOSPHATE 10 MG/ML IJ SOLN
INTRAMUSCULAR | Status: DC | PRN
  Administered 2022-04-05: 5 mg via INTRAVENOUS

## 2022-04-05 MED ORDER — FENTANYL CITRATE (PF) 100 MCG/2ML IJ SOLN
INTRAMUSCULAR | Status: DC | PRN
  Administered 2022-04-05: 50 ug via INTRAVENOUS

## 2022-04-05 SURGICAL SUPPLY — 15 items

## 2022-04-05 NOTE — Anesthesia Procedure Notes (Signed)
Date/Time: 04/05/2022 10:14 AM  Performed by: Cynda Familia, CRNAOxygen Delivery Method: Simple face mask Placement Confirmation: positive ETCO2 and breath sounds checked- equal and bilateral Dental Injury: Teeth and Oropharynx as per pre-operative assessment

## 2022-04-05 NOTE — Anesthesia Preprocedure Evaluation (Signed)
Anesthesia Evaluation  Patient identified by MRN, date of birth, ID band Patient awake    Reviewed: Allergy & Precautions, NPO status , Patient's Chart, lab work & pertinent test results  History of Anesthesia Complications Negative for: history of anesthetic complications  Airway Mallampati: III  TM Distance: <3 FB Neck ROM: Full    Dental  (+) Dental Advisory Given,    Pulmonary neg pulmonary ROS   breath sounds clear to auscultation       Cardiovascular hypertension, Pt. on medications and Pt. on home beta blockers  Rhythm:Regular   1. Left ventricular ejection fraction, by estimation, is 55 to 60%. The  left ventricle has normal function. The left ventricle has no regional  wall motion abnormalities. There is moderate asymmetric left ventricular  hypertrophy of the basal-septal  segment. Left ventricular diastolic parameters are consistent with Grade  II diastolic dysfunction (pseudonormalization). Elevated left atrial  pressure.   2. Right ventricular systolic function is normal. The right ventricular  size is normal. There is normal pulmonary artery systolic pressure. The  estimated right ventricular systolic pressure is 09.3 mmHg.   3. The mitral valve is normal in structure. Trivial mitral valve  regurgitation.   4. The aortic valve is tricuspid. Aortic valve regurgitation is not  visualized. No aortic stenosis is present.   5. The inferior vena cava is normal in size with <50% respiratory  variability, suggesting right atrial pressure of 8 mmHg.     Neuro/Psych  Headaches PSYCHIATRIC DISORDERS Anxiety Depression       GI/Hepatic Neg liver ROS,GERD  ,,  Endo/Other    Renal/GU Renal diseaseLab Results      Component                Value               Date                      CREATININE               0.93                03/10/2022                Musculoskeletal  (+) Arthritis ,    Abdominal   Peds   Hematology negative hematology ROS (+) Lab Results      Component                Value               Date                      WBC                      4.8                 03/10/2022                HGB                      14.6                03/10/2022                HCT                      43.5  03/10/2022                MCV                      89.7                03/10/2022                PLT                      161                 03/10/2022              Anesthesia Other Findings   Reproductive/Obstetrics                              Anesthesia Physical Anesthesia Plan  ASA: 2  Anesthesia Plan: General   Post-op Pain Management: Minimal or no pain anticipated   Induction: Intravenous  PONV Risk Score and Plan: 3 and Ondansetron and Dexamethasone  Airway Management Planned: Oral ETT  Additional Equipment: None  Intra-op Plan:   Post-operative Plan: Extubation in OR  Informed Consent: I have reviewed the patients History and Physical, chart, labs and discussed the procedure including the risks, benefits and alternatives for the proposed anesthesia with the patient or authorized representative who has indicated his/her understanding and acceptance.     Dental advisory given  Plan Discussed with: CRNA  Anesthesia Plan Comments:          Anesthesia Quick Evaluation

## 2022-04-05 NOTE — Transfer of Care (Signed)
Immediate Anesthesia Transfer of Care Note  Patient: [name]  Procedure(s) Performed: ESOPHAGOGASTRODUODENOSCOPY (EGD) WITH PROPOFOL SAVORY DILATION  Patient Location: PACU  Anesthesia Type:General  Level of Consciousness: awake  Airway & Oxygen Therapy: Patient Spontanous Breathing and Patient connected to face mask oxygen  Post-op Assessment: Report given to RN and Post -op Vital signs reviewed and stable  Post vital signs: Reviewed and stable  Last Vitals:  Vitals Value Taken Time  BP    Temp    Pulse    Resp    SpO2      Last Pain:  Vitals:   04/05/22 0914  TempSrc: Tympanic  PainSc: 0-No pain         Complications: No notable events documented.

## 2022-04-05 NOTE — Op Note (Signed)
[facility] Patient Name: [name] Procedure Date: 04/05/2022 MRN: [id number] Attending MD: [name] , MD, [id number] Date of Birth: [birth year] CSN: [id number] Age: 81 Admit Type: Outpatient Procedure:                Upper GI endoscopy Indications:              Dysphagia, Stricture of the esophagus, Follow-up of                            esophageal stricture, For therapy of esophageal                            stricture Providers:                [name], MD, Jaci Carrel, RN, Janie                            Billups, Technician, Glenis Smoker, CRNA Referring MD:              Medicines:                General Anesthesia Complications:            No immediate complications. Estimated blood loss:                            Minimal. Estimated Blood Loss:     Estimated blood loss was minimal. Procedure:                Pre-Anesthesia Assessment:                           - Prior to the procedure, a History and Physical                            was performed, and patient medications and                            allergies were reviewed. The patient's tolerance of                            previous anesthesia was also reviewed. The risks                            and benefits of the procedure and the sedation                            options and risks were discussed with the patient.                            All questions were answered, and informed consent                            was obtained. Prior Anticoagulants: The patient has                            taken  no anticoagulant or antiplatelet agents. ASA                            Grade Assessment: II - A patient with mild systemic                            disease. After reviewing the risks and benefits,                            the patient was deemed in satisfactory condition to                            undergo the procedure.                           After obtaining informed  consent, the endoscope was                            passed under direct vision. Throughout the                            procedure, the patient's blood pressure, pulse, and                            oxygen saturations were monitored continuously. The                            [id number]-H190 ([id number]) Olympus endoscope was introduced                            through the mouth, and advanced to the body of the                            stomach. The upper GI endoscopy was accomplished                            without difficulty. The patient tolerated the                            procedure well. Scope In: Scope Out: Findings:      One benign-appearing, intrinsic severe (stenosis; an endoscope cannot       pass) stenosis was found in the proximal esophagus. This stenosis       measured 1 cm (inner diameter) x 4 cm (in length). The stenosis was       traversed after dilation. A guidewire was placed under fluoroscopic       guidance and the scope was withdrawn. Dilation was performed with a       Savary dilator with mild resistance at 10 mm. A guidewire was placed       under fluoroscopic guidance and the scope was withdrawn. Dilation was       performed with a Savary dilator with mild resistance at 11 mm. A       guidewire was placed under fluoroscopic guidance and the scope was  withdrawn. Dilation was performed with a Savary dilator with moderate       resistance at 12 mm. The dilation site was examined following endoscope       reinsertion and showed mild mucosal disruption. Estimated blood loss was       minimal. Estimated blood loss was minimal. A guidewire was placed under       fluoroscopic guidance and the scope was withdrawn. Dilation was       performed with a Savary dilator with moderate resistance at 12.8 mm. The       dilation site was examined following endoscope reinsertion and showed       moderate mucosal disruption and scope passed withouth difficulty.      The exam  was otherwise without abnormality.      The cardia and gastric fundus were normal on retroflexion. Impression:               - Benign-appearing esophageal stenosis. Dilated.                           - The examination was otherwise normal - exam to                            gastric body                           - No specimens collected. Moderate Sedation:      Not Applicable - Patient had care per Anesthesia. Recommendation:           - Patient has a contact number available for                            emergencies. The signs and symptoms of potential                            delayed complications were discussed with the                            patient. Return to normal activities tomorrow.                            Written discharge instructions were provided to the                            patient.                           - Clear liquids x 1 hour then soft foods rest of                            day. Start prior diet tomorrow.                           - Continue present medications.                           - Repeat upper endoscopy PRN for retreatment. She  has required general anesthesia due to very                            proximal esophageal stricture and think that would                            be best for repeat dilation, if required Procedure Code(s):        --- Professional ---                           [phone], Esophagoscopy, flexible, transoral; with                            insertion of guide wire followed by passage of                            dilator(s) over guide wire Diagnosis Code(s):        --- Professional ---                           K22.2, Esophageal obstruction                           R13.10, Dysphagia, unspecified CPT copyright 2022 American [employer]. All rights reserved. The codes documented in this report are preliminary and upon coder review may  be revised to meet current compliance  requirements. [name], MD 04/05/2022 10:23:07 AM This report has been signed electronically. Number of Addenda: 0

## 2022-04-05 NOTE — Anesthesia Procedure Notes (Signed)
Procedure Name: Intubation Date/Time: 04/05/2022 9:42 AM  Performed by: Cynda Familia, CRNAPre-anesthesia Checklist: Patient identified, Emergency Drugs available, Suction available and Patient being monitored Patient Re-evaluated:Patient Re-evaluated prior to induction Oxygen Delivery Method: Circle System Utilized Preoxygenation: Pre-oxygenation with 100% oxygen Induction Type: IV induction and Cricoid Pressure applied Ventilation: Mask ventilation without difficulty Laryngoscope Size: Miller and 2 Grade View: Grade I Tube type: Oral Number of attempts: 1 Airway Equipment and Method: Stylet and Oral airway Placement Confirmation: ETT inserted through vocal cords under direct vision, positive ETCO2 and breath sounds checked- equal and bilateral Secured at: 22 cm Tube secured with: Tape Dental Injury: Teeth and Oropharynx as per pre-operative assessment  Comments: IV induction Moser-- intubation AM CRNA atraumatic--- teeth as preop -- chipping present on both front teeth-- unchanged-- bilat BS

## 2022-04-05 NOTE — H&P (Signed)
[facility] History and Physical   Primary Care Physician:  [name], MD   Reason for Procedure:   Dysphagia and esophageal stricture  Plan:    Upper endoscopy w/ esophageal dilation     HPI: [name] is a 81 y.o. 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 She is improved but still w/ some dysphagia to meats and especially pills and requested repeat evaluation and possible dilation. Gastrostomy tube was removed in September.   Past Medical History:  Diagnosis Date   Abdominal pain 05/29/2013   s/p rx of cephalo resistant e coli   but last rx NG  now residular ?  bladder sx repeat cx sx rx to ty and uro consult    ADJ DISORDER WITH MIXED ANXIETY & DEPRESSED MOOD 03/03/2010   Qualifier: Diagnosis of  By: [name], [name]    Agent resistant to multiple antibiotics 05/29/2013   e coli   bu NG on fu.     Anemia    Anxiety    ARF (acute renal failure) (HCC) 03/12/2015   Closed head injury 02/01/2011   from syncope and had scalp laceration  neg ct .     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    Pyelonephritis 03/12/2015   Sensation of pain in anesthetized distribution of trigeminal nerve    Syncope 02/01/2011   In shower on vacation  sustained head laceration  8 sutures Had ed visit neg head ct labs and x ray    Trigeminal  neuralgia pain     Past Surgical History:  Procedure Laterality Date   ABDOMINAL HYSTERECTOMY  2002   tubal   BACK SURGERY     2 times, for sciatic nerve pain   BALLOON DILATION  06/21/2021   Procedure: DILATION BALLOON used for esophageal stricture;  Surgeon: Irving Copas., MD;  Location: [facility];  Service: Gastroenterology;;   CARDIAC CATHETERIZATION  2000   chest pains neg   CHOLECYSTECTOMY N/A 02/21/2017   Procedure: LAPAROSCOPIC CHOLECYSTECTOMY WITH INTRAOPERATIVE CHOLANGIOGRAM;  Surgeon: Armandina Gemma, MD;  Location: [facility];  Service: General;  Laterality: N/A;   COLONOSCOPY     multiple   CRANIOTOMY  12/09/2011   nerve decompression right trigeminal    DIRECT LARYNGOSCOPY N/A 02/12/2021   Procedure: DIRECT LARYNGOSCOPY AND BIOPSY POSSIBLE FROZEN;  Surgeon: [name], MD;  Location: [facility];  Service: ENT;  Laterality: N/A;   DOPPLER ECHOCARDIOGRAPHY  2009   nl lv function mild lv dilitation   ESOPHAGOGASTRODUODENOSCOPY (EGD) WITH PROPOFOL N/A 06/21/2021   Procedure: ESOPHAGOGASTRODUODENOSCOPY (EGD) WITH PROPOFOL;  Surgeon: Irving Copas., MD;  Location:  [facility];  Service: Gastroenterology;  Laterality: N/A;  Request Fluoroscopy; Plan in for dilation   ESOPHAGOGASTRODUODENOSCOPY (EGD) WITH PROPOFOL N/A 07/19/2021   Procedure: ESOPHAGOGASTRODUODENOSCOPY (EGD) WITH PROPOFOL;  Surgeon: Rush Landmark Telford Nab., MD;  Location: [facility];  Service: Endoscopy;  Laterality: N/A;  Dilation fluoro   ESOPHAGOGASTRODUODENOSCOPY (EGD) WITH PROPOFOL N/A 08/05/2021   Procedure: ESOPHAGOGASTRODUODENOSCOPY (EGD) WITH PROPOFOL - fluoro;  Surgeon: Rush Landmark Telford Nab., MD;  Location: [place];  Service: Gastroenterology;  Laterality: N/A;   ESOPHAGOGASTRODUODENOSCOPY (EGD) WITH PROPOFOL N/A 08/23/2021   Procedure: ESOPHAGOGASTRODUODENOSCOPY (EGD) WITH PROPOFOL;  Surgeon: [name], MD;  [facility];  Service:  Gastroenterology;  Laterality: N/A;  Using Fluoroscopy and esophageal dilation   ESOPHAGOGASTRODUODENOSCOPY (EGD) WITH PROPOFOL N/A 09/22/2021   Procedure: ESOPHAGOGASTRODUODENOSCOPY (EGD) WITH PROPOFOL;  Surgeon: [name], MD;  [facility];  Service: Gastroenterology;  Laterality: N/A;  EGD with savary and flour   ESOPHAGOGASTRODUODENOSCOPY (EGD) WITH PROPOFOL N/A 12/07/2021   Procedure: ESOPHAGOGASTRODUODENOSCOPY (EGD) WITH PROPOFOL;  Surgeon: [name], MD;  [facility];  Service: Gastroenterology;  Laterality: N/A;  Schedule this with Fluroscopy, it is a savary dilation   EYE SURGERY Bilateral    ioc for catatracts   GASTROSTOMY N/A 02/26/2021   Procedure: OPEN GASTROSTOMY TUBE PLACEMENT;  Surgeon: Armandina Gemma, MD;  [facility]: [facility];  Service: General;  Laterality: N/A;   IR GASTROSTOMY TUBE MOD SED  02/22/2021   IR GASTROSTOMY TUBE REMOVAL  02/10/2022   IR REPLACE G-TUBE SIMPLE WO FLUORO  09/07/2021   laparoscopic gallbladder surgery  02/16/2017   Fax from [facility] Bilateral 2002   rt shoulder surgery     SAVORY DILATION N/A 06/21/2021   Procedure: SAVORY DILATION;  Surgeon: Irving Copas., MD;  Location: [facility];  Service: Gastroenterology;  Laterality: N/A;   SAVORY DILATION N/A 07/19/2021   Procedure: SAVORY DILATION;  Surgeon: Rush Landmark Telford Nab., MD;  [facility]: [facility];  Service: Endoscopy;  Laterality: N/A;   SAVORY DILATION N/A 08/05/2021   Procedure: SAVORY DILATION;  Surgeon: Rush Landmark Telford Nab., MD;  Location: [place];  Service: Gastroenterology;  Laterality: N/A;   SAVORY DILATION N/A 08/23/2021   Procedure: SAVORY DILATION;  Surgeon: [name], MD;  [facility]: [facility];  Service: Gastroenterology;  Laterality: N/A;   SAVORY DILATION N/A 09/22/2021   Procedure: SAVORY DILATION;  Surgeon: [name], MD;  [facility]: [facility];  Service: Gastroenterology;  Laterality: N/A;    SAVORY DILATION N/A 12/07/2021   Procedure: SAVORY DILATION;  Surgeon: [name], MD;  [facility];  Service: Gastroenterology;  Laterality: N/A;    Prior to Admission medications   Medication Sig Start Date End Date Taking? Authorizing Provider  acetaminophen (TYLENOL) 500 MG tablet Take 500-1,000 mg by mouth every 4 (four) hours as needed for headache, mild pain or moderate pain.   Yes [provider]  carvedilol (COREG) 25 MG tablet TAKE 1 TABLET(25 MG) BY MOUTH TWICE DAILY WITH A MEAL 01/11/22  Yes [name], [name], MD  Ferrous Sulfate (IRON PO) Take 1 tablet by mouth daily.   Yes [provider]  fluticasone (FLONASE) 50 MCG/ACT nasal spray Place 2 sprays into both nostrils as needed for allergies or rhinitis. Patient taking differently: Place 2 sprays into both nostrils daily as needed for allergies or rhinitis. 02/11/20  Yes [name], [name], MD  furosemide (LASIX) 20 MG tablet TAKE 1 TABLET(20 MG) BY MOUTH DAILY 02/18/22  Yes [name],  [name], MD  guaiFENesin-codeine 100-10 MG/5ML syrup Place 5 mLs into feeding tube 2 (two) times daily as needed for cough. 12/30/21  Yes [name], MD  lamoTRIgine (LAMICTAL) 100 MG tablet PLACE 2.5 TABLET INTO FEEDING TUBE 2 TIMES DAILY. Patient taking differently: Pt states that she is taking 1 in the AM and 2 at night 03/03/22  Yes Millikan, [name], NP  loperamide (IMODIUM) 2 MG capsule 2 mg daily as needed for diarrhea or loose stools. Per tube   Yes [provider]  Multiple Minerals-Vitamins (CAL-MAG-ZINC-D PO) Take 1 tablet by mouth 2 (two) times daily.   Yes [provider]  Multiple Vitamin (MULTIVITAMIN WITH MINERALS) TABS tablet Place 1 tablet into feeding tube in the morning.   Yes [provider]  mupirocin ointment (BACTROBAN) 2 % APPLY TOPICALLY TO THE AFFECTED AREA TWICE DAILY 11/07/21  Yes [provider]  omeprazole (PRILOSEC) 20 MG capsule 20 mg at bedtime. Per tube   Yes  [provider]  ondansetron (ZOFRAN-ODT) 4 MG disintegrating tablet DISSOLVE 1 TO 2 TABLETS(4 TO 8 MG) ON THE TONGUE EVERY 8 HOURS AS NEEDED FOR NAUSEA OR VOMITING 12/12/21  Yes [name], MD  prochlorperazine (COMPAZINE) 10 MG tablet TAKE 1 TABLET(10 MG) BY MOUTH EVERY 6 HOURS AS NEEDED FOR NAUSEA OR VOMITING Patient taking differently: Take 10 mg by mouth every 6 (six) hours as needed for nausea or vomiting. 06/17/21  Yes [name], MD  sucralfate (CARAFATE) 1 GM/10ML suspension Take 10 mLs (1 g total) by mouth 4 (four) times daily -  with meals and at bedtime. 08/23/21  Yes [name], MD  benazepril (LOTENSIN) 20 MG tablet TAKE 1 TABLET(20 MG) BY MOUTH TWICE DAILY 04/04/22   [name], [name], MD  Calcium Carb-Cholecalciferol 600-20 MG-MCG TABS Take by mouth.    [provider]  conjugated estrogens (PREMARIN) vaginal cream Place 1 applicator vaginally once a week.    [provider]  levETIRAcetam (KEPPRA) 100 MG/ML solution PLACE 2.5 ML INTO FEEDING TUBE TWICE DAILY Patient not taking: Reported on 03/17/2022 11/21/21   [provider]  neomycin-polymyxin-hydrocortisone (CORTISPORIN) 3.5-10000-1 OTIC suspension Apply 1-2 drops daily after soaking and cover with bandaid 03/30/22   McDonald, Stephan Minister, DPM  Nutritional Supplements (NUTREN 1.5) LIQD Place 237 mLs into feeding tube in the morning and at bedtime.    [provider]  Polyethyl Glycol-Propyl Glycol (SYSTANE) 0.4-0.3 % GEL ophthalmic gel Place 1 application into both eyes 2 (two) times daily as needed (dry eyes).    [provider]    Current Facility-Administered Medications  Medication Dose Route Frequency Provider Last Rate Last Admin   0.9 %  sodium chloride infusion   Intravenous Continuous [name], MD       lactated ringers infusion    Continuous PRN [name], MD 10 mL/hr at 04/05/22 0917 1,000 mL at 04/05/22 0917    Allergies as of 03/14/2022 - Review Complete  02/10/2022  Allergen Reaction Noted   Bactrim [sulfamethoxazole-trimethoprim] Hives and Itching    Hydrocodone Other (See Comments) 11/24/2015    Family History  Problem Relation Age of Onset   Cancer Mother        uterine   Ovarian cancer Mother    Stroke Mother    Alcohol abuse Father    Stroke Father    Diabetes Brother    Cancer Paternal Aunt        leukemia, unknown type   Seizures Daughter  Hypertension Other    Colon cancer Neg Hx    Dementia Neg Hx    Alzheimer's disease Neg Hx     Social History   Socioeconomic History   Marital status: Married    Spouse name: Not on file   Number of children: 2   Years of education: Not on file   Highest education level: Not on file  Occupational History    Comment: retired Forensic psychologist  Tobacco Use   Smoking status: Never   Smokeless tobacco: Never  Vaping Use   Vaping Use: Never used  Substance and Sexual Activity   Alcohol use: Not Currently    Alcohol/week: 2.0 standard drinks of alcohol    Types: 2 Glasses of wine per week    Comment: occ wine   Drug use: No   Sexual activity: Not on file  Other Topics Concern   Not on file  Social History Narrative   Married   HH of 2-3 (god daughter)   Pets 2 dogs   Non smoker    Child is a Engineer, drilling   G2P2      Caffeine: 2 cups/day   Social Determinants of Health   Financial Resource Strain: Low Risk  (02/09/2022)   Overall Financial Resource Strain (CARDIA)    Difficulty of Paying Living Expenses: Not very hard  Food Insecurity: No Food Insecurity (04/06/2021)   Hunger Vital Sign    Worried About Running Out of Food in the Last Year: Never true    [place] in the Last Year: Never true  Transportation Needs: No Transportation Needs (04/06/2021)   PRAPARE - Hydrologist (Medical): No    Lack of Transportation (Non-Medical): No  Physical Activity: Sufficiently Active (04/06/2021)   Exercise Vital Sign    Days of Exercise  per Week: 2 days    Minutes of Exercise per Session: 150+ min  Stress: No Stress Concern Present (04/06/2021)   Bend    Feeling of Stress : Not at all  Social Connections: Moderately Integrated (03/25/2020)   Social Connection and Isolation Panel [NHANES]    Frequency of Communication with Friends and Family: More than three times a week    Frequency of Social Gatherings with Friends and Family: Once a week    Attends Religious Services: 1 to 4 times per year    Active Member of [employer] or Organizations: No    Attends Archivist Meetings: Never    Marital Status: Married  Human resources officer Violence: Not At Risk (03/25/2020)   Humiliation, Afraid, Rape, and Kick questionnaire    Fear of Current or Ex-Partner: No    Emotionally Abused: No    Physically Abused: No    Sexually Abused: No    Review of Systems:  All other review of systems negative except as mentioned in the HPI.  Physical Exam: Vital signs BP (!) 187/84   Pulse 60   Temp (!) 97.1 F (36.2 C) (Tympanic)   Resp 18   Ht 5' 6"  (1.676 m)   Wt 61.6 kg   SpO2 100%   BMI 21.92 kg/m   General:   Alert,  Well-developed, well-nourished, pleasant and cooperative in NAD Lungs:  Clear throughout to auscultation.   Heart:  Regular rate and rhythm; no murmurs, clicks, rubs,  or gallops. Abdomen:  Soft, nontender and nondistended. Normal bowel sounds.   Neuro/Psych:  Alert and cooperative. Normal  mood and affect. A and O x 3   @Laloni Rowton  Simonne Maffucci, MD, [facility] [phone] (pager) 04/05/2022 9:31 AM@

## 2022-04-05 NOTE — Discharge Instructions (Signed)
I dilated the esophagus to 12.8 mm today - that is the most that has been done.  I hope this helps - not sure it will fix the pill problem but will see.   I appreciate the opportunity to care for you. [name], MD, FACG  YOU HAD AN ENDOSCOPIC PROCEDURE TODAY: Refer to the procedure report and other information in the discharge instructions given to you for any specific questions about what was found during the examination. If this information does not answer your questions, please call Dr. Celesta Aver office at [phone] to clarify.   YOU SHOULD EXPECT: Some feelings of bloating in the abdomen. Passage of more gas than usual. Walking can help get rid of the air that was put into your GI tract during the procedure and reduce the bloating. If you had a lower endoscopy (such as a colonoscopy or flexible sigmoidoscopy) you may notice spotting of blood in your stool or on the toilet paper. Some abdominal soreness may be present for a day or two, also.  DIET:   Clear liquids only until 11 AM  Then soft foods rest of today and try your normal diet starting tomorrow.   ACTIVITY: Your care partner should take you home directly after the procedure. You should plan to take it easy, moving slowly for the rest of the day. You can resume normal activity the day after the procedure however YOU SHOULD NOT DRIVE, use power tools, machinery or perform tasks that involve climbing or major physical exertion for 24 hours (because of the sedation medicines used during the test).   SYMPTOMS TO REPORT IMMEDIATELY: A gastroenterologist can be reached at any hour. Please call [phone]  for any of the following symptoms:   Following upper endoscopy (EGD, EUS, ERCP, esophageal dilation) Vomiting of blood or coffee ground material  New, significant abdominal pain  New, significant chest pain or pain under the shoulder blades  Painful or persistently difficult swallowing  New shortness of breath  Black,  tarry-looking or red, bloody stools

## 2022-04-06 ENCOUNTER — Encounter (HOSPITAL_COMMUNITY): Payer: Self-pay

## 2022-04-06 ENCOUNTER — Ambulatory Visit (HOSPITAL_COMMUNITY)
Admission: RE | Admit: 2022-04-06 | Discharge: 2022-04-06 | Disposition: A | Discharge status: Home / Self Care | Payer: Medicare Other | Source: Ambulatory Visit | Attending: Hematology | Admitting: Hematology

## 2022-04-06 DIAGNOSIS — C159 Malignant neoplasm of esophagus, unspecified: Secondary | ICD-10-CM | POA: Diagnosis not present

## 2022-04-06 DIAGNOSIS — Z90710 Acquired absence of both cervix and uterus: Secondary | ICD-10-CM | POA: Diagnosis not present

## 2022-04-06 DIAGNOSIS — C153 Malignant neoplasm of upper third of esophagus: Secondary | ICD-10-CM | POA: Insufficient documentation

## 2022-04-06 DIAGNOSIS — M4316 Spondylolisthesis, lumbar region: Secondary | ICD-10-CM | POA: Diagnosis not present

## 2022-04-06 DIAGNOSIS — Z9049 Acquired absence of other specified parts of digestive tract: Secondary | ICD-10-CM | POA: Diagnosis not present

## 2022-04-06 MED ORDER — SODIUM CHLORIDE (PF) 0.9 % IJ SOLN
INTRAMUSCULAR | Status: AC
  Filled 2022-04-06: qty 50

## 2022-04-06 MED ORDER — IOHEXOL 300 MG/ML  SOLN
100.0000 mL | Freq: Once | INTRAMUSCULAR | Status: AC | PRN
  Administered 2022-04-06: 100 mL via INTRAVENOUS

## 2022-04-06 NOTE — Anesthesia Postprocedure Evaluation (Signed)
Anesthesia Post Note  Patient: [name] Below  Procedure(s) Performed: ESOPHAGOGASTRODUODENOSCOPY (EGD) WITH PROPOFOL SAVORY DILATION     Patient location during evaluation: Endoscopy Anesthesia Type: General Level of consciousness: awake and alert Pain management: pain level controlled Vital Signs Assessment: post-procedure vital signs reviewed and stable Respiratory status: spontaneous breathing, nonlabored ventilation and respiratory function stable Cardiovascular status: stable and blood pressure returned to baseline Postop Assessment: no apparent nausea or vomiting Anesthetic complications: no   No notable events documented.  Last Vitals:  Vitals:   04/05/22 1030 04/05/22 1040  BP: (!) 167/78 (!) 183/80  Pulse: 60 (!) 56  Resp: 20 12  Temp:    SpO2: 97% 96%    Last Pain:  Vitals:   04/05/22 1022  TempSrc: Temporal  PainSc: Asleep                 [name]

## 2022-04-07 ENCOUNTER — Inpatient Hospital Stay: Payer: Medicare Other | Attending: Nurse Practitioner

## 2022-04-07 ENCOUNTER — Encounter: Payer: Medicare Other | Admitting: Nutrition

## 2022-04-07 ENCOUNTER — Inpatient Hospital Stay: Payer: Medicare Other

## 2022-04-07 VITALS — BP 162/82 | HR 59 | Temp 97.6°F | Resp 16 | Wt 136.0 lb

## 2022-04-07 DIAGNOSIS — Z79899 Other long term (current) drug therapy: Secondary | ICD-10-CM | POA: Diagnosis not present

## 2022-04-07 DIAGNOSIS — C153 Malignant neoplasm of upper third of esophagus: Secondary | ICD-10-CM | POA: Diagnosis not present

## 2022-04-07 DIAGNOSIS — Z5112 Encounter for antineoplastic immunotherapy: Secondary | ICD-10-CM | POA: Insufficient documentation

## 2022-04-07 LAB — CMP (CANCER CENTER ONLY)
ALT: 18 U/L (ref 0–44)
AST: 22 U/L (ref 15–41)
Albumin: 4 g/dL (ref 3.5–5.0)
Alkaline Phosphatase: 74 U/L (ref 38–126)
Anion gap: 3 — ABNORMAL LOW (ref 5–15)
BUN: 29 mg/dL — ABNORMAL HIGH (ref 8–23)
CO2: 38 mmol/L — ABNORMAL HIGH (ref 22–32)
Calcium: 10.6 mg/dL — ABNORMAL HIGH (ref 8.9–10.3)
Chloride: 101 mmol/L (ref 98–111)
Creatinine: 0.94 mg/dL (ref 0.44–1.00)
GFR, Estimated: >60 mL/min (ref >60)
Glucose, Bld: 92 mg/dL (ref 70–99)
Potassium: 4.5 mmol/L (ref 3.5–5.1)
Sodium: 142 mmol/L (ref 135–145)
Total Bilirubin: 0.7 mg/dL (ref 0.3–1.2)
Total Protein: 6.6 g/dL (ref 6.5–8.1)

## 2022-04-07 LAB — CBC WITH DIFFERENTIAL (CANCER CENTER ONLY)
Abs Immature Granulocytes: 0.01 10*3/uL (ref 0.00–0.07)
Basophils Absolute: 0 10*3/uL (ref 0.0–0.1)
Basophils Relative: 1 %
Eosinophils Absolute: 0.1 10*3/uL (ref 0.0–0.5)
Eosinophils Relative: 3 %
HCT: 42.6 % (ref 36.0–46.0)
Hemoglobin: 13.9 g/dL (ref 12.0–15.0)
Immature Granulocytes: 0 %
Lymphocytes Relative: 19 %
Lymphs Abs: 0.9 10*3/uL (ref 0.7–4.0)
MCH: 30.2 pg (ref 26.0–34.0)
MCHC: 32.6 g/dL (ref 30.0–36.0)
MCV: 92.6 fL (ref 80.0–100.0)
Monocytes Absolute: 0.4 10*3/uL (ref 0.1–1.0)
Monocytes Relative: 9 %
Neutro Abs: 3.1 10*3/uL (ref 1.7–7.7)
Neutrophils Relative %: 68 %
Platelet Count: 145 10*3/uL — ABNORMAL LOW (ref 150–400)
RBC: 4.6 MIL/uL (ref 3.87–5.11)
RDW: 13.9 % (ref 11.5–15.5)
WBC Count: 4.6 10*3/uL (ref 4.0–10.5)
nRBC: 0 % (ref 0.0–0.2)

## 2022-04-07 MED ORDER — SODIUM CHLORIDE 0.9 % IV SOLN: Freq: Once | INTRAVENOUS | Status: AC

## 2022-04-07 MED ORDER — SODIUM CHLORIDE 0.9 % IV SOLN
480.0000 mg | Freq: Once | INTRAVENOUS | Status: AC
  Administered 2022-04-07: 480 mg via INTRAVENOUS
  Filled 2022-04-07: qty 48

## 2022-04-07 NOTE — Patient Instructions (Signed)
[facility]   Discharge Instructions: Thank you for choosing [facility] to provide your oncology and hematology care.   If you have a lab appointment with the [facility], please go directly to the [facility] and check in at the registration area.   Wear comfortable clothing and clothing appropriate for easy access to any Portacath or PICC line.   We strive to give you quality time with your provider. You may need to reschedule your appointment if you arrive late (15 or more minutes).  Arriving late affects you and other patients whose appointments are after yours.  Also, if you miss three or more appointments without notifying the office, you may be dismissed from the clinic at the provider's discretion.      For prescription refill requests, have your pharmacy contact our office and allow 72 hours for refills to be completed.    Today you received the following chemotherapy and/or immunotherapy agents: Nivolumab (Opdivo)     To help prevent nausea and vomiting after your treatment, we encourage you to take your nausea medication as directed.  BELOW ARE SYMPTOMS THAT SHOULD BE REPORTED IMMEDIATELY: *FEVER GREATER THAN 100.4 F (38 C) OR HIGHER *CHILLS OR SWEATING *NAUSEA AND VOMITING THAT IS NOT CONTROLLED WITH YOUR NAUSEA MEDICATION *UNUSUAL SHORTNESS OF BREATH *UNUSUAL BRUISING OR BLEEDING *URINARY PROBLEMS (pain or burning when urinating, or frequent urination) *BOWEL PROBLEMS (unusual diarrhea, constipation, pain near the anus) TENDERNESS IN MOUTH AND THROAT WITH OR WITHOUT PRESENCE OF ULCERS (sore throat, sores in mouth, or a toothache) UNUSUAL RASH, SWELLING OR PAIN  UNUSUAL VAGINAL DISCHARGE OR ITCHING   Items with * indicate a potential emergency and should be followed up as soon as possible or go to the Emergency Department if any problems should occur.  Please show the CHEMOTHERAPY ALERT CARD or IMMUNOTHERAPY ALERT CARD at  check-in to the Emergency Department and triage nurse.  Should you have questions after your visit or need to cancel or reschedule your appointment, please contact [facility]  Dept: [phone]  and follow the prompts.  Office hours are 8:00 a.m. to 4:30 p.m. Monday - Friday. Please note that voicemails left after 4:00 p.m. may not be returned until the following business day.  We are closed weekends and major holidays. You have access to a nurse at all times for urgent questions. Please call the main number to the clinic Dept: [phone] and follow the prompts.   For any non-urgent questions, you may also contact your provider using MyChart. We now offer e-Visits for anyone 18 and older to request care online for non-urgent symptoms. For details visit [email or web address].   Also download the MyChart app! Go to the app store, search "MyChart", open the app, select [facility], and log in with your MyChart username and password.  Masks are optional in the cancer centers. If you would like for your care team to wear a mask while they are taking care of you, please let them know. You may have one support Mael Delap who is at least 82 years old accompany you for your appointments.

## 2022-04-08 ENCOUNTER — Encounter (HOSPITAL_COMMUNITY): Payer: Self-pay | Admitting: Internal Medicine

## 2022-04-08 LAB — TSH: TSH: 2.302 u[IU]/mL (ref 0.350–4.500)

## 2022-04-08 LAB — T4: T4, Total: 8.2 ug/dL (ref 4.5–12.0)

## 2022-04-11 ENCOUNTER — Other Ambulatory Visit: Payer: Self-pay | Admitting: Internal Medicine

## 2022-04-11 ENCOUNTER — Inpatient Hospital Stay (HOSPITAL_BASED_OUTPATIENT_CLINIC_OR_DEPARTMENT_OTHER): Payer: Medicare Other | Admitting: Hematology

## 2022-04-11 ENCOUNTER — Encounter: Payer: Self-pay | Admitting: Hematology

## 2022-04-11 ENCOUNTER — Other Ambulatory Visit: Payer: Self-pay

## 2022-04-11 DIAGNOSIS — H02831 Dermatochalasis of right upper eyelid: Secondary | ICD-10-CM | POA: Diagnosis not present

## 2022-04-11 DIAGNOSIS — H02834 Dermatochalasis of left upper eyelid: Secondary | ICD-10-CM | POA: Diagnosis not present

## 2022-04-11 DIAGNOSIS — C153 Malignant neoplasm of upper third of esophagus: Secondary | ICD-10-CM | POA: Diagnosis not present

## 2022-04-11 DIAGNOSIS — H02423 Myogenic ptosis of bilateral eyelids: Secondary | ICD-10-CM | POA: Diagnosis not present

## 2022-04-11 DIAGNOSIS — H02413 Mechanical ptosis of bilateral eyelids: Secondary | ICD-10-CM | POA: Diagnosis not present

## 2022-04-11 NOTE — Progress Notes (Addendum)
[place]   [phone]) [phone] Fax:(336) [phone]   Clinic Follow up Note   Patient Care Team: [name], [name], MD as PCP - General [name], MD as PCP - Cardiology (Cardiology) Cindie Crumbly (Neurosurgery) Irine Seal, MD as Attending Physician (Urology) Erline Levine, MD as Consulting Physician (Neurosurgery) [name], MD as Consulting Physician (Neurology) Viona Gilmore, [facility] as Pharmacist (Pharmacist) [name], MD as Consulting Physician (Gastroenterology) Carol Ada, MD as Consulting Physician (Gastroenterology) [name], MD (Inactive) as Consulting Physician (Otolaryngology) [name], MD as Consulting Physician (Hematology) Kyung Rudd, MD as Consulting Physician (Radiation Oncology) Kyung Rudd, MD as Consulting Physician (Radiation Oncology)  Date of Service:  04/11/2022  I connected with _0 @ on 04/11/22 at  3:30 PM EST by telephone and verified that I am speaking with the correct person using two identifiers.   I discussed the limitations, risks, security and privacy concerns of performing an evaluation and management service by telephone and the availability of in person appointments. I also discussed with the patient that there may be a patient responsible charge related to this service. The patient expressed understanding and agreed to proceed.   Patient's location:  Home   Provider's location:  Office    CHIEF COMPLAINT: f/u of esophageal cancer   CURRENT THERAPY:  Nivolumab, started 05/21/21, currently q4weeks   ASSESSMENT:  [name] is a 81 y.o. female with  1. Squamous cell carcinoma of cervical esophagus, cTxN2M0 -presented with dysphagia and weight loss. EGD 9/15 and CT neck 02/05/21 showed 6 cm mass in the cervical/upper esophagus with possible local adenopathy. Path confirmed squamous cell carcinoma of the esophagus, PDL1 testing 95% positive -PET scan 02/23/21 shows  hypermetabolic esophageal mass and hypermetabolic paratracheal nodes, negative for distant metastasis.  -due to the location in the cervical esophagus, Dr. Kipp Brood feels she is not a surgical candidate.  -she received concurrent chemoRT with carbo/taxol 03/08/21 - 04/14/21. She required dose reduction with last cycle due to radiation dermatitis, hoarseness, dysphagia, and odynophagia but was able to complete treatment as scheduled.  -given PDL1+, she began immunotherapy with Nivolumab on 05/21/21. Will plan to give this for a total of a year if NED. -last EGD with esophageal dilation on 12/07/21 with Dr. Carlean Purl. -restaging CT CAP on 12/14/21 showed stable esophageal wall thickening without evidence of recurrent or metastatic disease. She has no evidence of cancer on EGD and scan now, but still has risk of recurrence, especially in next 2-3 years  -labs reviewed, overall stable and adequate to proceed with Nivo today. Will obtain another scan in 2 months and plan to complete one year treatment in Dec 2023.   2. Dysphagia, weight loss, cough -Secondary to #1 -s/p G-tube placement by Dr. Harlow Asa on 02/22/21 -she continues esophageal dilation as needed under Dr. Carlean Purl, most recently on 12/07/21. She hopes for repeat soon. -she has not used her feeding tube in the last two months and would like to have it removed. I will refer her back to Dr. Harlow Asa.   3. Trigeminal neuralgia, sciatica, headaches -On lamotrigene alone -f/up Dr. Jaynee Eagles       PLAN: -Reviewed scan with the Pt. NED -she will return on 12/14 for last treatment  -f/u in 3 month/ Lab   SUMMARY OF ONCOLOGIC HISTORY: Oncology History  Malignant neoplasm of upper third esophagus ([place])  12/21/2020 Imaging   Laryngoscopy Comments:    On fiberoptic laryngoscopy through the right nostril the nasopharynx was clear.  The base of tongue vallecula and epiglottis were normal.    Piriform sinuses were clear bilaterally and vocal cords were clear  with  normal vocal mobility.  No structural abnormalities noted.   01/22/2021 Imaging   DG esophagus IMPRESSION: 1. Luminal narrowing in the high cervical esophagus over a 3 cm segment is highly concerning for esophageal neoplasm. Inflammatory process would be a secondary consideration. Luminal narrowing occurs approximately at the C5-C7 vertebral body level just below the glottis. Patient experienced several episodes of choking related to this luminal narrowing. Recommend expedient upper GI endoscopy for evaluation. 2. Distal thoracic esophagus and GE junction appear normal.   02/04/2021 Procedure   EGD by Dr. Carlean Purl impression- One mass-like severe stenosis was found in the upper third of the esophagus. The stenosis was not traversed.   02/05/2021 Imaging   CT soft tissue neck w contrast IMPRESSION: Mass-like soft tissue thickening and mucosal hyperenhancement of the cervical esophagus and hypopharynx, spanning the C4-T1 levels, likely reflecting an esophageal malignancy. This measures up to 2.4 x 3.4 cm in transaxial dimensions, and 6.3 cm in craniocaudal dimension. Associated severe effacement of the esophageal lumen.   Centrally necrotic right paratracheal lymph node beneath the level of the mass, measuring 1.3 x 0.9 cm, and likely reflecting a site of nodal metastatic disease.   Additional lymph nodes along the posterior and inferior aspect of the right thyroid lobe (along the right aspect of the esophageal mass), which measure subcentimeter but are asymmetrically prominent and highly suspicious for additional sites of nodal metastatic disease.   02/12/2021 Pathology Results   FINAL MICROSCOPIC DIAGNOSIS:  A. ESOPHAGUS, UPPER CERVICAL #1, BIOPSY:  - Squamous cell carcinoma.  B. ESOPHAGUS, UPPER CERVICAL #2, BIOPSY:  - Squamous cell carcinoma.    02/12/2021 Cancer Staging   Staging form: Esophagus - Squamous Cell Carcinoma, AJCC 8th Edition - Clinical stage from 02/12/2021: Stage Unknown  (cTX, cN2, cM0) - Signed by [name], MD on 03/08/2021 Stage prefix: Initial diagnosis   02/18/2021 Initial Diagnosis   Malignant neoplasm of upper third esophagus ([place])   02/23/2021 PET scan   IMPRESSION: Markedly hypermetabolic focal masslike thickening of the cervical esophagus, compatible with primary esophageal malignancy.   Markedly hypermetabolic right upper paratracheal lymph node, compatible with metastatic disease.   Small right upper paratracheal and right level IIA lymph nodes with mild hypermetabolic activity, concerning for additional sites of nodal metastatic disease.   Aortic Atherosclerosis (ICD10-I70.0).   03/09/2021 - 04/13/2021 Chemotherapy   Patient is on Treatment Plan : ESOPHAGUS Carboplatin/PACLitaxel weekly x 6 weeks with XRT       05/21/2021 - 01/13/2022 Chemotherapy   Patient is on Treatment Plan : GASTROESOPHAGEAL Nivolumab q14d x 8 cycles / Nivolumab q28d     05/21/2021 -  Chemotherapy   Patient is on Treatment Plan : GASTROESOPHAGEAL Nivolumab (240) q14d x 8 cycles / Nivolumab (480) q28d        INTERVAL HISTORY:  Alverta Caccamo is here for a follow up of  esophageal cancer  was last seen by me on 02/10/2022 She presents phone visit accompanied by  Husband. Pt is eating well. Feeding tube has been removed. Pt is gaining weight. No pain.   All other systems were reviewed with the patient and are negative.  MEDICAL HISTORY:  Past Medical History:  Diagnosis Date   Abdominal pain 05/29/2013   s/p rx of cephalo resistant e coli   but last rx NG  now residular ?  bladder sx repeat  cx sx rx to ty and uro consult    ADJ DISORDER WITH MIXED ANXIETY & DEPRESSED MOOD 03/03/2010   Qualifier: Diagnosis of  By: [name], [name]    Agent resistant to multiple antibiotics 05/29/2013   e coli   bu NG on fu.     Anemia    Anxiety    ARF (acute renal failure) (HCC) 03/12/2015   Closed head injury 02/01/2011   from syncope and had scalp laceration  neg  ct .     Closed head injury 5-6 yrs ago   Colitis 18/29/9371   Complication of anesthesia    migraine several hours after general anesthesia   Depression    esophageal ca 01/2021   Fatty liver    Gall stones 2016   see ct scan neg HIDA    GERD (gastroesophageal reflux disease)    Hearing aid worn    HOH (hard of hearing)    both ears   Hyperlipidemia    Hypertension    echo nl lv function  mild dilitation 2009   Kidney infection    few yrs ago in hospital   Medication side effect 09/02/2010   Poss muscle se of 10 crestor    Migraine    hypnic HA eval by Dr. 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    Pyelonephritis 03/12/2015   Sensation of pain in anesthetized distribution of trigeminal nerve    Syncope 02/01/2011   In shower on vacation  sustained head laceration  8 sutures Had ed visit neg head ct labs and x ray    Trigeminal neuralgia pain     SURGICAL HISTORY: Past Surgical History:  Procedure Laterality Date   ABDOMINAL HYSTERECTOMY  2002   tubal   BACK SURGERY     2 times, for sciatic nerve pain   BALLOON DILATION  06/21/2021   Procedure: DILATION BALLOON used for esophageal stricture;  Surgeon: Irving Copas., MD;  Location: [facility];  Service: Gastroenterology;;   CARDIAC CATHETERIZATION  2000   chest pains neg   CHOLECYSTECTOMY N/A 02/21/2017   Procedure: LAPAROSCOPIC CHOLECYSTECTOMY WITH INTRAOPERATIVE CHOLANGIOGRAM;  Surgeon: Armandina Gemma, MD;  Location: [facility];  Service: General;  Laterality: N/A;   COLONOSCOPY     multiple   CRANIOTOMY  12/09/2011   nerve decompression right trigeminal    DIRECT LARYNGOSCOPY N/A 02/12/2021   Procedure: DIRECT LARYNGOSCOPY AND BIOPSY POSSIBLE FROZEN;  Surgeon: [name], MD;  Location: [place];  Service: ENT;  Laterality: N/A;   DOPPLER ECHOCARDIOGRAPHY  2009   nl lv function mild lv dilitation   ESOPHAGOGASTRODUODENOSCOPY (EGD) WITH PROPOFOL N/A  06/21/2021   Procedure: ESOPHAGOGASTRODUODENOSCOPY (EGD) WITH PROPOFOL;  Surgeon: Irving Copas., MD;  Location: [facility];  Service: Gastroenterology;  Laterality: N/A;  Request Fluoroscopy; Plan in for dilation   ESOPHAGOGASTRODUODENOSCOPY (EGD) WITH PROPOFOL N/A 07/19/2021   Procedure: ESOPHAGOGASTRODUODENOSCOPY (EGD) WITH PROPOFOL;  Surgeon: Rush Landmark Telford Nab., MD;  Location: [facility];  Service: Endoscopy;  Laterality: N/A;  Dilation fluoro   ESOPHAGOGASTRODUODENOSCOPY (EGD) WITH PROPOFOL N/A 08/05/2021   Procedure: ESOPHAGOGASTRODUODENOSCOPY (EGD) WITH PROPOFOL - fluoro;  Surgeon: Rush Landmark Telford Nab., MD;  Location: [place];  Service: Gastroenterology;  Laterality: N/A;   ESOPHAGOGASTRODUODENOSCOPY (EGD) WITH PROPOFOL N/A 08/23/2021   Procedure: ESOPHAGOGASTRODUODENOSCOPY (EGD) WITH PROPOFOL;  Surgeon: [name], MD;  Location: [facility];  Service: Gastroenterology;  Laterality: N/A;  Using Fluoroscopy and  esophageal dilation   ESOPHAGOGASTRODUODENOSCOPY (EGD) WITH PROPOFOL N/A 09/22/2021   Procedure: ESOPHAGOGASTRODUODENOSCOPY (EGD) WITH PROPOFOL;  Surgeon: [name], MD;  [facility]: [facility];  Service: Gastroenterology;  Laterality: N/A;  EGD with savary and flour   ESOPHAGOGASTRODUODENOSCOPY (EGD) WITH PROPOFOL N/A 12/07/2021   Procedure: ESOPHAGOGASTRODUODENOSCOPY (EGD) WITH PROPOFOL;  Surgeon: [name], MD;  [facility]: [facility];  Service: Gastroenterology;  Laterality: N/A;  Schedule this with Fluroscopy, it is a savary dilation   ESOPHAGOGASTRODUODENOSCOPY (EGD) WITH PROPOFOL N/A 04/05/2022   Procedure: ESOPHAGOGASTRODUODENOSCOPY (EGD) WITH PROPOFOL;  Surgeon: [name], MD;  [facility]: [facility];  Service: Gastroenterology;  Laterality: N/A;  EGD /savary dilation with fluoro   EYE SURGERY Bilateral    ioc for catatracts   GASTROSTOMY N/A 02/26/2021   Procedure: OPEN GASTROSTOMY TUBE PLACEMENT;  Surgeon: Armandina Gemma, MD;  Location:  [facility];  Service: General;  Laterality: N/A;   IR GASTROSTOMY TUBE MOD SED  02/22/2021   IR GASTROSTOMY TUBE REMOVAL  02/10/2022   IR REPLACE G-TUBE SIMPLE WO FLUORO  09/07/2021   laparoscopic gallbladder surgery  02/16/2017   Fax from [facility] Bilateral 2002   rt shoulder surgery     SAVORY DILATION N/A 06/21/2021   Procedure: SAVORY DILATION;  Surgeon: Irving Copas., MD;  Location: [facility];  Service: Gastroenterology;  Laterality: N/A;   SAVORY DILATION N/A 07/19/2021   Procedure: SAVORY DILATION;  Surgeon: Rush Landmark Telford Nab., MD;  Location: [facility];  Service: Endoscopy;  Laterality: N/A;   SAVORY DILATION N/A 08/05/2021   Procedure: SAVORY DILATION;  Surgeon: Rush Landmark Telford Nab., MD;  Location: [place];  Service: Gastroenterology;  Laterality: N/A;   SAVORY DILATION N/A 08/23/2021   Procedure: SAVORY DILATION;  Surgeon: [name], MD;  Location: [facility];  Service: Gastroenterology;  Laterality: N/A;   SAVORY DILATION N/A 09/22/2021   Procedure: SAVORY DILATION;  Surgeon: [name], MD;  Location: [facility];  Service: Gastroenterology;  Laterality: N/A;   SAVORY DILATION N/A 12/07/2021   Procedure: SAVORY DILATION;  Surgeon: [name], MD;  Location: [facility];  Service: Gastroenterology;  Laterality: N/A;   SAVORY DILATION N/A 04/05/2022   Procedure: SAVORY DILATION;  Surgeon: [name], MD;  Location: [facility];  Service: Gastroenterology;  Laterality: N/A;    I have reviewed the social history and family history with the patient and they are unchanged from previous note.  ALLERGIES:  is allergic to bactrim [sulfamethoxazole-trimethoprim], hydrocodone, and sulfa antibiotics.  MEDICATIONS:  Current Outpatient Medications  Medication Sig Dispense Refill   acetaminophen (TYLENOL) 500 MG tablet Take 500-1,000 mg by mouth every 4 (four) hours as needed for headache, mild pain or moderate pain.      benazepril (LOTENSIN) 20 MG tablet TAKE 1 TABLET(20 MG) BY MOUTH TWICE DAILY 180 tablet 0   Calcium Carb-Cholecalciferol 600-20 MG-MCG TABS Take by mouth.     carvedilol (COREG) 25 MG tablet TAKE 1 TABLET(25 MG) BY MOUTH TWICE DAILY WITH A MEAL 60 tablet 2   conjugated estrogens (PREMARIN) vaginal cream Place 1 applicator vaginally once a week.     Ferrous Sulfate (IRON PO) Take 1 tablet by mouth daily.     fluticasone (FLONASE) 50 MCG/ACT nasal spray Place 2 sprays into both nostrils as needed for allergies or rhinitis. (Patient taking differently: Place 2 sprays into both nostrils daily as needed for allergies or rhinitis.) 16 g 5   furosemide (LASIX) 20 MG tablet TAKE 1  TABLET(20 MG) BY MOUTH DAILY 90 tablet 0   guaiFENesin-codeine 100-10 MG/5ML syrup Place 5 mLs into feeding tube 2 (two) times daily as needed for cough. 236 mL 0   lamoTRIgine (LAMICTAL) 100 MG tablet 100 mg AM and 200 Mg PM 150 tablet 1   loperamide (IMODIUM) 2 MG capsule 2 mg daily as needed for diarrhea or loose stools. Per tube     Multiple Minerals-Vitamins (CAL-MAG-ZINC-D PO) Take 1 tablet by mouth 2 (two) times daily.     Multiple Vitamin (MULTIVITAMIN WITH MINERALS) TABS tablet Place 1 tablet into feeding tube in the morning.     mupirocin ointment (BACTROBAN) 2 % APPLY TOPICALLY TO THE AFFECTED AREA TWICE DAILY     neomycin-polymyxin-hydrocortisone (CORTISPORIN) 3.5-10000-1 OTIC suspension Apply 1-2 drops daily after soaking and cover with bandaid 10 mL 0   Nutritional Supplements (NUTREN 1.5) LIQD Place 237 mLs into feeding tube in the morning and at bedtime.     ondansetron (ZOFRAN-ODT) 4 MG disintegrating tablet DISSOLVE 1 TO 2 TABLETS(4 TO 8 MG) ON THE TONGUE EVERY 8 HOURS AS NEEDED FOR NAUSEA OR VOMITING 30 tablet 1   Polyethyl Glycol-Propyl Glycol (SYSTANE) 0.4-0.3 % GEL ophthalmic gel Place 1 application into both eyes 2 (two) times daily as needed (dry eyes).     prochlorperazine (COMPAZINE) 10 MG tablet TAKE  1 TABLET(10 MG) BY MOUTH EVERY 6 HOURS AS NEEDED FOR NAUSEA OR VOMITING (Patient taking differently: Take 10 mg by mouth every 6 (six) hours as needed for nausea or vomiting.) 30 tablet 1   No current facility-administered medications for this visit.    PHYSICAL EXAMINATION: ECOG PERFORMANCE STATUS: 1 - Symptomatic but completely ambulatory  There were no vitals filed for this visit. Wt Readings from Last 3 Encounters:  04/07/22 136 lb (61.7 kg)  04/05/22 135 lb 12.9 oz (61.6 kg)  03/17/22 135 lb 12.8 oz (61.6 kg)     GENERAL:alert, no distress and comfortable SKIN: skin color normal, no rashes or significant lesions EYES: normal, Conjunctiva are pink and non-injected, sclera clear  NEURO: alert & oriented x 3 with fluent speech LABORATORY DATA:  I have reviewed the data as listed    Latest Ref Rng & Units 04/07/2022    3:04 PM 03/10/2022   12:59 PM 02/10/2022    8:59 AM  CBC  WBC 4.0 - 10.5 K/uL 4.6  4.8  4.5   Hemoglobin 12.0 - 15.0 g/dL 13.9  14.6  14.8   Hematocrit 36.0 - 46.0 % 42.6  43.5  44.5   Platelets 150 - 400 K/uL 145  161  148         Latest Ref Rng & Units 04/07/2022    3:04 PM 03/10/2022   12:59 PM 02/10/2022    8:59 AM  CMP  Glucose 70 - 99 mg/dL 92  100  215   BUN 8 - 23 mg/dL _0 Creatinine 0.44 - 1.00 mg/dL 0.94  0.93  0.99   Sodium 135 - 145 mmol/L 142  141  140   Potassium 3.5 - 5.1 mmol/L 4.5  4.5  3.9   Chloride 98 - 111 mmol/L 101  103  105   CO2 22 - 32 mmol/L 38  34  30   Calcium 8.9 - 10.3 mg/dL 10.6  10.0  9.5   Total Protein 6.5 - 8.1 g/dL 6.6  6.3  6.3   Total Bilirubin 0.3 - 1.2 mg/dL 0.7  0.7  0.9  Alkaline Phos 38 - 126 U/L 74  82  88   AST 15 - 41 U/L _0 ALT 0 - 44 U/L _1 RADIOGRAPHIC STUDIES: I have personally reviewed the radiological images as listed and agreed with the findings in the report. No results found.    I discussed the assessment and treatment plan with the patient. The  patient was provided an opportunity to ask questions and all were answered. The patient agreed with the plan and demonstrated an understanding of the instructions.   The patient was advised to call back or seek an in-person evaluation if the symptoms worsen or if the condition fails to improve as anticipated.  I provided 22 minutes of non face-to-face telephone visit time during this encounter, and > 50% was spent counseling as documented under my assessment & plan.    [name], MD 04/11/2022   Felicity Coyer, CMA, am acting as scribe for [name], MD.   I have reviewed the above documentation for accuracy and completeness, and I agree with the above.

## 2022-04-12 ENCOUNTER — Ambulatory Visit (INDEPENDENT_AMBULATORY_CARE_PROVIDER_SITE_OTHER): Payer: Medicare Other

## 2022-04-12 ENCOUNTER — Ambulatory Visit: Payer: Medicare Other

## 2022-04-12 VITALS — Ht 66.0 in | Wt 135.0 lb

## 2022-04-12 DIAGNOSIS — Z0000 Encounter for general adult medical examination without abnormal findings: Secondary | ICD-10-CM | POA: Diagnosis not present

## 2022-04-12 NOTE — Patient Instructions (Addendum)
[name] , Thank you for taking time to come for your Medicare Wellness Visit. I appreciate your ongoing commitment to your health goals. Please review the following plan we discussed and let me know if I can assist you in the future.   These are the goals we discussed:  Goals       No current goals (pt-stated)      Patient Stated      None at this time      Patient Stated      04/06/2021, live longer        This is a list of the screening recommended for you and due dates:  Health Maintenance  Topic Date Due   COVID-19 Vaccine (3 - [place] risk series) 05/11/2022*   Flu Shot  08/22/2022*   Medicare Annual Wellness Visit  04/13/2023   Pneumonia Vaccine  Completed   DEXA scan (bone density measurement)  Completed   Zoster (Shingles) Vaccine  Completed   HPV Vaccine  Aged Out  *Topic was postponed. The date shown is not the original due date.    Advanced directives: In Chart  Conditions/risks identified: None  Next appointment: Follow up in one year for your annual wellness visit     Preventive Care 65 Years and Older, Female Preventive care refers to lifestyle choices and visits with your health care provider that can promote health and wellness. What does preventive care include? A yearly physical exam. This is also called an annual well check. Dental exams once or twice a year. Routine eye exams. Ask your health care provider how often you should have your eyes checked. Personal lifestyle choices, including: Daily care of your teeth and gums. Regular physical activity. Eating a healthy diet. Avoiding tobacco and drug use. Limiting alcohol use. Practicing safe sex. Taking low-dose aspirin every day. Taking vitamin and mineral supplements as recommended by your health care provider. What happens during an annual well check? The services and screenings done by your health care provider during your annual well check will depend on your age, overall health,  lifestyle risk factors, and family history of disease. Counseling  Your health care provider may ask you questions about your: Alcohol use. Tobacco use. Drug use. Emotional well-being. Home and relationship well-being. Sexual activity. Eating habits. History of falls. Memory and ability to understand (cognition). Work and work Statistician. Reproductive health. Screening  You may have the following tests or measurements: Height, weight, and BMI. Blood pressure. Lipid and cholesterol levels. These may be checked every 5 years, or more frequently if you are over 32 years old. Skin check. Lung cancer screening. You may have this screening every year starting at age 23 if you have a 30-pack-year history of smoking and currently smoke or have quit within the past 15 years. Fecal occult blood test (FOBT) of the stool. You may have this test every year starting at age 31. Flexible sigmoidoscopy or colonoscopy. You may have a sigmoidoscopy every 5 years or a colonoscopy every 10 years starting at age 70. Hepatitis C blood test. Hepatitis B blood test. Sexually transmitted disease (STD) testing. Diabetes screening. This is done by checking your blood sugar (glucose) after you have not eaten for a while (fasting). You may have this done every 1-3 years. Bone density scan. This is done to screen for osteoporosis. You may have this done starting at age 75. Mammogram. This may be done every 1-2 years. Talk to your health care provider about how often you should  have regular mammograms. Talk with your health care provider about your test results, treatment options, and if necessary, the need for more tests. Vaccines  Your health care provider may recommend certain vaccines, such as: Influenza vaccine. This is recommended every year. Tetanus, diphtheria, and acellular pertussis (Tdap, Td) vaccine. You may need a Td booster every 10 years. Zoster vaccine. You may need this after age  57. Pneumococcal 13-valent conjugate (PCV13) vaccine. One dose is recommended after age 21. Pneumococcal polysaccharide (PPSV23) vaccine. One dose is recommended after age 8. Talk to your health care provider about which screenings and vaccines you need and how often you need them. This information is not intended to replace advice given to you by your health care provider. Make sure you discuss any questions you have with your health care provider. Document Released: 06/05/2015 Document Revised: 01/27/2016 Document Reviewed: 03/10/2015 Elsevier Interactive Patient Education  2017 [place] Prevention in the Home Falls can cause injuries. They can happen to people of all ages. There are many things you can do to make your home safe and to help prevent falls. What can I do on the outside of my home? Regularly fix the edges of walkways and driveways and fix any cracks. Remove anything that might make you trip as you walk through a door, such as a raised step or threshold. Trim any bushes or trees on the path to your home. Use bright outdoor lighting. Clear any walking paths of anything that might make someone trip, such as rocks or tools. Regularly check to see if handrails are loose or broken. Make sure that both sides of any steps have handrails. Any raised decks and porches should have guardrails on the edges. Have any leaves, snow, or ice cleared regularly. Use sand or salt on walking paths during winter. Clean up any spills in your garage right away. This includes oil or grease spills. What can I do in the bathroom? Use night lights. Install grab bars by the toilet and in the tub and shower. Do not use towel bars as grab bars. Use non-skid mats or decals in the tub or shower. If you need to sit down in the shower, use a plastic, non-slip stool. Keep the floor dry. Clean up any water that spills on the floor as soon as it happens. Remove soap buildup in the tub or shower  regularly. Attach bath mats securely with double-sided non-slip rug tape. Do not have throw rugs and other things on the floor that can make you trip. What can I do in the bedroom? Use night lights. Make sure that you have a light by your bed that is easy to reach. Do not use any sheets or blankets that are too big for your bed. They should not hang down onto the floor. Have a firm chair that has side arms. You can use this for support while you get dressed. Do not have throw rugs and other things on the floor that can make you trip. What can I do in the kitchen? Clean up any spills right away. Avoid walking on wet floors. Keep items that you use a lot in easy-to-reach places. If you need to reach something above you, use a strong step stool that has a grab bar. Keep electrical cords out of the way. Do not use floor polish or wax that makes floors slippery. If you must use wax, use non-skid floor wax. Do not have throw rugs and other things on the floor  that can make you trip. What can I do with my stairs? Do not leave any items on the stairs. Make sure that there are handrails on both sides of the stairs and use them. Fix handrails that are broken or loose. Make sure that handrails are as long as the stairways. Check any carpeting to make sure that it is firmly attached to the stairs. Fix any carpet that is loose or worn. Avoid having throw rugs at the top or bottom of the stairs. If you do have throw rugs, attach them to the floor with carpet tape. Make sure that you have a light switch at the top of the stairs and the bottom of the stairs. If you do not have them, ask someone to add them for you. What else can I do to help prevent falls? Wear shoes that: Do not have high heels. Have rubber bottoms. Are comfortable and fit you well. Are closed at the toe. Do not wear sandals. If you use a stepladder: Make sure that it is fully opened. Do not climb a closed stepladder. Make sure that  both sides of the stepladder are locked into place. Ask someone to hold it for you, if possible. Clearly mark and make sure that you can see: Any grab bars or handrails. First and last steps. Where the edge of each step is. Use tools that help you move around (mobility aids) if they are needed. These include: Canes. Walkers. Scooters. Crutches. Turn on the lights when you go into a dark area. Replace any light bulbs as soon as they burn out. Set up your furniture so you have a clear path. Avoid moving your furniture around. If any of your floors are uneven, fix them. If there are any pets around you, be aware of where they are. Review your medicines with your doctor. Some medicines can make you feel dizzy. This can increase your chance of falling. Ask your doctor what other things that you can do to help prevent falls. This information is not intended to replace advice given to you by your health care provider. Make sure you discuss any questions you have with your health care provider. Document Released: 03/05/2009 Document Revised: 10/15/2015 Document Reviewed: 06/13/2014 Elsevier Interactive Patient Education  2017 [employer].

## 2022-04-12 NOTE — Progress Notes (Signed)
Subjective:   [name] is a 81 y.o. female who presents for Medicare Annual (Subsequent) preventive examination.  Review of Systems    Virtual Visit via Telephone Note  I connected with  [name] on 04/12/22 at  3:30 PM EST by telephone and verified that I am speaking with the correct person using two identifiers.  Location: Patient: Home Provider: Office Persons participating in the virtual visit: patient/Nurse Health Advisor   I discussed the limitations, risks, security and privacy concerns of performing an evaluation and management service by telephone and the availability of in person appointments. The patient expressed understanding and agreed to proceed.  Interactive audio and video telecommunications were attempted between this nurse and patient, however failed, due to patient having technical difficulties OR patient did not have access to video capability.  We continued and completed visit with audio only.  Some vital signs may be absent or patient reported.   Angela Peaches, LPN  Cardiac Risk Factors include: advanced age (>57mn, >>94women);hypertension     Objective:    Today's Vitals   04/12/22 1534  Weight: 135 lb (61.2 kg)  Height: 5' 6"  (1.676 m)   Body mass index is 21.79 kg/m.     04/12/2022    3:41 PM 04/05/2022    9:10 AM 12/07/2021    9:23 AM 09/22/2021   10:54 AM 08/23/2021   11:19 AM 08/05/2021   10:22 AM 07/19/2021    1:19 PM  Advanced Directives  Does Patient Have a Medical Advance Directive? Yes Yes Yes Yes Yes Yes Yes  Type of AParamedicof ARomeLiving will HColumbusLiving will HEverestLiving will  Living will;Healthcare Power of Attorney Living will HMeagherLiving will  Does patient want to make changes to medical advance directive? No - Patient declined        Copy of HGreendalein Chart? Yes - validated most  recent copy scanned in chart (See row information) Yes - validated most recent copy scanned in chart (See row information)   Yes - validated most recent copy scanned in chart (See row information)  Yes - validated most recent copy scanned in chart (See row information)    Current Medications (verified) Outpatient Encounter Medications as of 04/12/2022  Medication Sig   acetaminophen (TYLENOL) 500 MG tablet Take 500-1,000 mg by mouth every 4 (four) hours as needed for headache, mild pain or moderate pain.   benazepril (LOTENSIN) 20 MG tablet TAKE 1 TABLET(20 MG) BY MOUTH TWICE DAILY   Calcium Carb-Cholecalciferol 600-20 MG-MCG TABS Take by mouth.   carvedilol (COREG) 25 MG tablet TAKE 1 TABLET(25 MG) BY MOUTH TWICE DAILY WITH A MEAL   conjugated estrogens (PREMARIN) vaginal cream Place 1 applicator vaginally once a week.   Ferrous Sulfate (IRON PO) Take 1 tablet by mouth daily.   fluticasone (FLONASE) 50 MCG/ACT nasal spray Place 2 sprays into both nostrils as needed for allergies or rhinitis. (Patient taking differently: Place 2 sprays into both nostrils daily as needed for allergies or rhinitis.)   furosemide (LASIX) 20 MG tablet TAKE 1 TABLET(20 MG) BY MOUTH DAILY   guaiFENesin-codeine 100-10 MG/5ML syrup Place 5 mLs into feeding tube 2 (two) times daily as needed for cough.   lamoTRIgine (LAMICTAL) 100 MG tablet 100 mg AM and 200 Mg PM   loperamide (IMODIUM) 2 MG capsule 2 mg daily as needed for diarrhea or loose stools. Per tube  Multiple Minerals-Vitamins (CAL-MAG-ZINC-D PO) Take 1 tablet by mouth 2 (two) times daily.   Multiple Vitamin (MULTIVITAMIN WITH MINERALS) TABS tablet Place 1 tablet into feeding tube in the morning.   mupirocin ointment (BACTROBAN) 2 % APPLY TOPICALLY TO THE AFFECTED AREA TWICE DAILY   neomycin-polymyxin-hydrocortisone (CORTISPORIN) 3.5-10000-1 OTIC suspension Apply 1-2 drops daily after soaking and cover with bandaid   Nutritional Supplements (NUTREN 1.5) LIQD  Place 237 mLs into feeding tube in the morning and at bedtime.   ondansetron (ZOFRAN-ODT) 4 MG disintegrating tablet DISSOLVE 1 TO 2 TABLETS(4 TO 8 MG) ON THE TONGUE EVERY 8 HOURS AS NEEDED FOR NAUSEA OR VOMITING   Polyethyl Glycol-Propyl Glycol (SYSTANE) 0.4-0.3 % GEL ophthalmic gel Place 1 application into both eyes 2 (two) times daily as needed (dry eyes).   prochlorperazine (COMPAZINE) 10 MG tablet TAKE 1 TABLET(10 MG) BY MOUTH EVERY 6 HOURS AS NEEDED FOR NAUSEA OR VOMITING (Patient taking differently: Take 10 mg by mouth every 6 (six) hours as needed for nausea or vomiting.)   No facility-administered encounter medications on file as of 04/12/2022.    Allergies (verified) Bactrim [sulfamethoxazole-trimethoprim], Hydrocodone, and Sulfa antibiotics   History: Past Medical History:  Diagnosis Date   Abdominal pain 05/29/2013   s/p rx of cephalo resistant e coli   but last rx NG  now residular ?  bladder sx repeat cx sx rx to ty and uro consult    ADJ DISORDER WITH MIXED ANXIETY & DEPRESSED MOOD 03/03/2010   Qualifier: Diagnosis of  By: [name], [name]    Agent resistant to multiple antibiotics 05/29/2013   e coli   bu NG on fu.     Anemia    Anxiety    ARF (acute renal failure) (HCC) 03/12/2015   Closed head injury 02/01/2011   from syncope and had scalp laceration  neg ct .     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    Pyelonephritis 03/12/2015   Sensation of pain in anesthetized distribution of trigeminal nerve    Syncope 02/01/2011   In shower on vacation  sustained head laceration  8 sutures Had ed visit neg head ct labs and x ray    Trigeminal neuralgia pain    Past Surgical History:  Procedure Laterality Date   ABDOMINAL HYSTERECTOMY  2002   tubal   BACK SURGERY     2 times, for sciatic nerve pain   BALLOON DILATION  06/21/2021   Procedure: DILATION BALLOON used for esophageal stricture;  Surgeon: Irving Copas., MD;  Location: [facility];  Service: Gastroenterology;;   CARDIAC CATHETERIZATION  2000   chest pains neg   CHOLECYSTECTOMY N/A  02/21/2017   Procedure: LAPAROSCOPIC CHOLECYSTECTOMY WITH INTRAOPERATIVE CHOLANGIOGRAM;  Surgeon: Armandina Gemma, MD;  Location: [facility];  Service: General;  Laterality: N/A;   COLONOSCOPY     multiple   CRANIOTOMY  12/09/2011   nerve decompression right trigeminal    DIRECT LARYNGOSCOPY N/A 02/12/2021   Procedure: DIRECT LARYNGOSCOPY AND BIOPSY POSSIBLE FROZEN;  Surgeon: [name], MD;  Location: [place];  Service: ENT;  Laterality: N/A;   DOPPLER ECHOCARDIOGRAPHY  2009   nl lv function mild lv dilitation   ESOPHAGOGASTRODUODENOSCOPY (EGD) WITH PROPOFOL N/A 06/21/2021   Procedure: ESOPHAGOGASTRODUODENOSCOPY (EGD) WITH PROPOFOL;  Surgeon: Irving Copas., MD;  Location: [facility];  Service: Gastroenterology;  Laterality: N/A;  Request Fluoroscopy; Plan in for dilation   ESOPHAGOGASTRODUODENOSCOPY (EGD) WITH PROPOFOL N/A 07/19/2021   Procedure: ESOPHAGOGASTRODUODENOSCOPY (EGD) WITH PROPOFOL;  Surgeon: Rush Landmark Telford Nab., MD;  Location: [facility];  Service: Endoscopy;  Laterality: N/A;  Dilation fluoro   ESOPHAGOGASTRODUODENOSCOPY (EGD) WITH PROPOFOL N/A 08/05/2021   Procedure: ESOPHAGOGASTRODUODENOSCOPY (EGD) WITH PROPOFOL - fluoro;  Surgeon: Rush Landmark Telford Nab., MD;   Location: [place];  Service: Gastroenterology;  Laterality: N/A;   ESOPHAGOGASTRODUODENOSCOPY (EGD) WITH PROPOFOL N/A 08/23/2021   Procedure: ESOPHAGOGASTRODUODENOSCOPY (EGD) WITH PROPOFOL;  Surgeon: [name], MD;  Location: [facility];  Service: Gastroenterology;  Laterality: N/A;  Using Fluoroscopy and esophageal dilation   ESOPHAGOGASTRODUODENOSCOPY (EGD) WITH PROPOFOL N/A 09/22/2021   Procedure: ESOPHAGOGASTRODUODENOSCOPY (EGD) WITH PROPOFOL;  Surgeon: [name], MD;  [facility];  Service: Gastroenterology;  Laterality: N/A;  EGD with savary and flour   ESOPHAGOGASTRODUODENOSCOPY (EGD) WITH PROPOFOL N/A 12/07/2021   Procedure: ESOPHAGOGASTRODUODENOSCOPY (EGD) WITH PROPOFOL;  Surgeon: [name], MD;  Location: [facility];  Service: Gastroenterology;  Laterality: N/A;  Schedule this with Fluroscopy, it is a savary dilation   ESOPHAGOGASTRODUODENOSCOPY (EGD) WITH PROPOFOL N/A 04/05/2022   Procedure: ESOPHAGOGASTRODUODENOSCOPY (EGD) WITH PROPOFOL;  Surgeon: [name], MD;  Location: [facility];  Service: Gastroenterology;  Laterality: N/A;  EGD /savary dilation with fluoro   EYE SURGERY Bilateral    ioc for catatracts   GASTROSTOMY N/A 02/26/2021   Procedure: OPEN GASTROSTOMY TUBE PLACEMENT;  Surgeon: Armandina Gemma, MD;  Location: [facility];  Service: General;  Laterality: N/A;   IR GASTROSTOMY TUBE MOD SED  02/22/2021   IR GASTROSTOMY TUBE REMOVAL  02/10/2022   IR REPLACE G-TUBE SIMPLE WO FLUORO  09/07/2021   laparoscopic gallbladder surgery  02/16/2017   Fax from [place] Bilateral 2002   rt shoulder surgery     SAVORY DILATION N/A 06/21/2021   Procedure: SAVORY DILATION;  Surgeon: Irving Copas., MD;  Location: [facility];  Service: Gastroenterology;  Laterality: N/A;   SAVORY DILATION N/A 07/19/2021   Procedure: SAVORY DILATION;  Surgeon: Rush Landmark Telford Nab., MD;  Location: [facility];  Service: Endoscopy;   Laterality: N/A;   SAVORY DILATION N/A 08/05/2021   Procedure: SAVORY DILATION;  Surgeon: Rush Landmark Telford Nab., MD;  Location: [place];  Service: Gastroenterology;  Laterality: N/A;   SAVORY DILATION N/A 08/23/2021   Procedure: SAVORY DILATION;  Surgeon: [name], MD;  Location: [facility];  Service: Gastroenterology;  Laterality: N/A;   SAVORY DILATION N/A 09/22/2021   Procedure: SAVORY DILATION;  Surgeon: [name], MD;  Location: [facility];  Service: Gastroenterology;  Laterality: N/A;   SAVORY DILATION N/A 12/07/2021   Procedure: SAVORY DILATION;  Surgeon: [name], MD;  Location: [facility];  Service: Gastroenterology;  Laterality: N/A;   SAVORY DILATION N/A 04/05/2022   Procedure: SAVORY DILATION;  Surgeon: [name], MD;  Location: [facility];  Service: Gastroenterology;  Laterality: N/A;   Family History  Problem Relation Age of Onset   Cancer Mother        uterine   Ovarian cancer Mother    Stroke Mother    Alcohol abuse Father    Stroke Father    Diabetes Brother    Cancer Paternal Aunt        leukemia, unknown type   Seizures Daughter    Hypertension Other    Colon cancer Neg Hx    Dementia Neg Hx    Alzheimer's disease Neg Hx    Social History   Socioeconomic History   Marital status: Married    Spouse name: Not on file   Number of children: 2   Years of education: Not on file   Highest education level: Not on file  Occupational History    Comment: retired Forensic psychologist  Tobacco Use   Smoking status: Never   Smokeless tobacco: Never  Vaping Use   Vaping Use: Never used  Substance and Sexual Activity   Alcohol use: Not Currently    Alcohol/week: 2.0 standard drinks of alcohol    Types: 2 Glasses of wine per week    Comment: occ wine   Drug use: No   Sexual activity: Not on file  Other Topics Concern   Not on file  Social History Narrative   Married   HH of 2-3 (god daughter)   Pets 2 dogs   Non smoker     Child is a Engineer, drilling   G2P2      Caffeine: 2 cups/day   Social Determinants of Health   Financial Resource Strain: Low Risk  (04/12/2022)   Overall Financial Resource Strain (CARDIA)    Difficulty of Paying Living Expenses: Not hard at all  Food Insecurity: No Food Insecurity (04/12/2022)   Hunger Vital Sign    Worried About Running Out of Food in the Last Year: Never true    Ran Out of Food in the Last Year: Never true  Transportation Needs: No Transportation Needs (04/12/2022)   PRAPARE - Hydrologist (Medical): No    Lack of Transportation (Non-Medical): No  Physical Activity: Insufficiently Active (04/12/2022)   Exercise Vital Sign    Days of Exercise per Week: 3 days    Minutes of Exercise per Session: 30 min  Stress: No Stress Concern Present (04/12/2022)   [place]    Feeling of Stress : Not at all  Social Connections: Moderately Isolated (04/12/2022)   Social Connection and Isolation Panel [NHANES]    Frequency of Communication with Friends and Family: More than three times a week    Frequency of Social Gatherings with Friends and Family: More than three times a week    Attends Religious Services: Never    Marine scientist or Organizations: No    Attends Music therapist: Never    Marital Status: Married    Tobacco Counseling Counseling given: Not Answered   Clinical Intake:  Pre-visit preparation completed: No  Pain : No/denies pain     BMI - recorded: 21.79 Nutritional Status: BMI of 19-24  Normal Nutritional Risks: None Diabetes: No  How often do you need to have someone help you when you read instructions, pamphlets, or other written  materials from your doctor or pharmacy?: 1 - Never  Diabetic?  No  Interpreter Needed?: No  Information entered by :: Almond Lint LPN   Activities of Daily Living    04/12/2022    3:40 PM  In  your present state of health, do you have any difficulty performing the following activities:  Hearing? 0  Vision? 0  Difficulty concentrating or making decisions? 0  Walking or climbing stairs? 0  Dressing or bathing? 0  Doing errands, shopping? 0  Preparing Food and eating ? N  Using the Toilet? N  In the past six months, have you accidently leaked urine? N  Do you have problems with loss of bowel control? N  Managing your Medications? N  Managing your Finances? N  Housekeeping or managing your Housekeeping? N    Patient Care Team: [name], [name], MD as PCP - General [name], MD as PCP - Cardiology (Cardiology) Cindie Crumbly (Neurosurgery) Irine Seal, MD as Attending Physician (Urology) Erline Levine, MD as Consulting Physician (Neurosurgery) [name], MD as Consulting Physician (Neurology) Viona Gilmore, [facility] as Pharmacist (Pharmacist) [name], MD as Consulting Physician (Gastroenterology) Carol Ada, MD as Consulting Physician (Gastroenterology) [name], MD (Inactive) as Consulting Physician (Otolaryngology) [name], MD as Consulting Physician (Hematology) Kyung Rudd, MD as Consulting Physician (Radiation Oncology) Kyung Rudd, MD as Consulting Physician (Radiation Oncology)  Indicate any recent Medical Services you may have received from other than [facility] providers in the past year (date may be approximate).     Assessment:   This is a routine wellness examination for Derhonda.  Hearing/Vision screen Hearing Screening - Comments:: Denies hearing difficulties   Vision Screening - Comments:: Wears rx glasses - up to date with routine eye exams with Dr Luretha Rued   Dietary issues and exercise activities discussed: Exercise limited by: None identified   Goals Addressed               This Visit's Progress     No current goals (pt-stated)         Depression Screen    04/12/2022    3:39 PM 01/27/2022    1:14 PM  12/27/2021    2:43 PM 04/06/2021    3:51 PM 03/25/2020    3:31 PM 05/27/2019   10:09 AM 05/24/2017   11:34 AM  PHQ 2/9 Scores  PHQ - 2 Score 0 0 0 0 0 0 0  PHQ- 9 Score  0 1        Fall Risk    04/12/2022    3:40 PM 12/27/2021    2:42 PM 04/06/2021    3:50 PM 03/25/2020    3:34 PM 03/17/2020    1:39 PM  [place] in the past year? 0 0 1 0 1  Comment   tripped over curb    Number falls in past yr: 0 0 1 0 1  Injury with Fall? 0 0 1 0 1  Comment   stitches in head    Risk for fall due to : No Fall Risks No Fall Risks Impaired balance/gait;Impaired mobility;Medication side effect Impaired balance/gait;Impaired mobility   Follow up Falls prevention discussed Falls evaluation completed Falls evaluation completed;Education provided;Falls prevention discussed Falls prevention discussed     FALL RISK PREVENTION PERTAINING TO THE HOME:  Any stairs in or around the home? No  If so, are there any without handrails? No  Home free of loose throw rugs in walkways,  pet beds, electrical cords, etc? Yes  Adequate lighting in your home to reduce risk of falls? Yes   ASSISTIVE DEVICES UTILIZED TO PREVENT FALLS:  Life alert? Yes  Use of a cane, walker or w/c? No  Grab bars in the bathroom? Yes  Shower chair or bench in shower? Yes  Elevated toilet seat or a handicapped toilet? No   TIMED UP AND GO:  Was the test performed? No . Audio Visit  Cognitive Function:    07/08/2020    3:56 PM  MMSE - Mini Mental State Exam  Orientation to time 5  Orientation to Place 5  Registration 3  Attention/ Calculation 2  Recall 3  Language- name 2 objects 2  Language- repeat 1  Language- follow 3 step command 3  Language- read & follow direction 1  Write a sentence 1  Copy design 0  Total score 26        04/12/2022    3:41 PM 04/06/2021    3:53 PM 03/25/2020    3:40 PM  6CIT Screen  What Year? 0 points 0 points 0 points  What month? 0 points 0 points 0 points  What time? 0 points 0  points   Count back from 20 0 points 0 points 0 points  Months in reverse 0 points 0 points 0 points  Repeat phrase 0 points 0 points 0 points  Total Score 0 points 0 points     Immunizations Immunization History  Administered Date(s) Administered   Fluad Quad(high Dose 65+) 02/18/2019, 03/17/2020   Influenza Split 03/10/2011, 02/02/2012   Influenza Whole 02/18/2008, 03/03/2010   Influenza, High Dose Seasonal PF 03/04/2014, 02/01/2016, 02/22/2017, 03/20/2018   Influenza,inj,Quad PF,6+ Mos 03/05/2013   PFIZER(Purple Top)SARS-COV-2 Vaccination 06/01/2019, 06/22/2019   Pneumococcal Conjugate-13 11/13/2013   Pneumococcal Polysaccharide-23 03/23/2015   Td 05/23/1996   Tdap 09/19/2011   Zoster Recombinat (Shingrix) 11/02/2019, 04/15/2020   Zoster, Live 09/19/2011      Flu Vaccine status: Up to date  Pneumococcal vaccine status: Up to date  Covid-19 vaccine status: Completed vaccines  Qualifies for Shingles Vaccine? Yes   Zostavax completed Yes   Shingrix Completed?: Yes  Screening Tests Health Maintenance  Topic Date Due   COVID-19 Vaccine (3 - Pfizer risk series) 05/11/2022 (Originally 07/20/2019)   INFLUENZA VACCINE  08/22/2022 (Originally 12/21/2021)   Medicare Annual Wellness (AWV)  04/13/2023   Pneumonia Vaccine 48+ Years old  Completed   DEXA SCAN  Completed   Zoster Vaccines- Shingrix  Completed   HPV VACCINES  Aged Out    Health Maintenance  There are no preventive care reminders to display for this patient.   Colorectal cancer screening: No longer required.   Mammogram status: No longer required due to Age.  Bone Density status: Completed 06/07/18. Results reflect: Bone density results: OSTEOPOROSIS. Repeat every   years.  Lung Cancer Screening: (Low Dose CT Chest recommended if Age 64-80 years, 30 pack-year currently smoking OR have quit w/in 15years.) does not qualify.     Additional Screening:  Hepatitis C Screening: does not qualify; Completed     Vision Screening: Recommended annual ophthalmology exams for early detection of glaucoma and other disorders of the eye. Is the patient up to date with their annual eye exam?  Yes  Who is the provider or what is the name of the office in which the patient attends annual eye exams? Dr Luretha Rued If pt is not established with a provider, would they like to be referred  to a provider to establish care? No .   Dental Screening: Recommended annual dental exams for proper oral hygiene  Community Resource Referral / Chronic Care Management:  CRR required this visit?  No   CCM required this visit?  No      Plan:     I have personally reviewed and noted the following in the patient's chart:   Medical and social history Use of alcohol, tobacco or illicit drugs  Current medications and supplements including opioid prescriptions. Patient is not currently taking opioid prescriptions. Functional ability and status Nutritional status Physical activity Advanced directives List of other physicians Hospitalizations, surgeries, and ER visits in previous 12 months Vitals Screenings to include cognitive, depression, and falls Referrals and appointments  In addition, I have reviewed and discussed with patient certain preventive protocols, quality metrics, and best practice recommendations. A written personalized care plan for preventive services as well as general preventive health recommendations were provided to patient.     Angela Peaches, LPN   [id number]   Nurse Notes: None

## 2022-04-13 ENCOUNTER — Other Ambulatory Visit: Payer: Self-pay

## 2022-04-16 NOTE — Progress Notes (Signed)
Chief Complaint  Patient presents with   Annual Exam    HPI: Patient  [name]  81 y.o. comes in today for Preventive Health Care visit  Under sureveillance   rx esophageal cancer   remitted r radiation  chemo  remitted at this time eating fine as well as swallowing  Under neuro care for tgn  just had gamma knife with some success  Bp had been low in past   and stoppled the benazepril when under rx and bp still ok  Current probem on going frequency  without bruning or fever  somt stress incontinence and leakage .   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   No recent falls  using cane and walker when out of house Health Maintenance  Topic Date Due   COVID-19 Vaccine (3 - Pfizer risk series) 05/11/2022 (Originally 07/20/2019)   INFLUENZA VACCINE  08/22/2022 (Originally 12/21/2021)   Medicare Annual Wellness (AWV)  04/13/2023   Pneumonia Vaccine 52+ Years old  Completed   DEXA SCAN  Completed   Zoster Vaccines- Shingrix  Completed   HPV VACCINES  Aged Out   Health Maintenance Review LIFESTYLE:  Exercise:  walking   cane  outside. And walker  Tobacco/ETS: no Alcohol:  no Sugar beverages: not regular   ging[place]  Sleep:ok now  Drug use: no HH of  2  greyhound  13 years.   ROS:  GREST of 12 system review negative except as per HPI   Past Medical History:  Diagnosis Date   Abdominal pain 05/29/2013   s/p rx of cephalo resistant e coli   but last rx NG  now residular ?  bladder sx repeat cx sx rx to ty and uro consult    ADJ DISORDER WITH MIXED ANXIETY & DEPRESSED MOOD 03/03/2010   Qualifier: Diagnosis of  By: [name], [name]    Agent resistant to multiple antibiotics 05/29/2013   e coli   bu NG on fu.     Anemia    Anxiety    ARF (acute renal failure) (HCC) 03/12/2015   Closed head injury 02/01/2011   from syncope and had scalp laceration  neg ct .     Closed head injury 5-6 yrs ago   Colitis 66/29/4765    Complication of anesthesia    migraine several hours after general anesthesia   Depression    esophageal ca 01/2021   Fatty liver    Gall stones 2016   see ct scan neg HIDA    GERD (gastroesophageal reflux disease)    Hearing aid worn    HOH (hard of hearing)    both ears   Hyperlipidemia    Hypertension    echo nl lv function  mild dilitation 2009   Kidney infection    few yrs ago in hospital   Medication side effect 09/02/2010   Poss muscle se of 10 crestor    Migraine    hypnic HA eval by Dr. 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    Pyelonephritis 03/12/2015   Sensation of pain in anesthetized distribution of trigeminal nerve    Syncope 02/01/2011   In shower on vacation  sustained head laceration  8 sutures Had ed visit neg head ct labs and x ray    Trigeminal neuralgia pain     Past Surgical  History:  Procedure Laterality Date   ABDOMINAL HYSTERECTOMY  2002   tubal   BACK SURGERY     2 times, for sciatic nerve pain   BALLOON DILATION  06/21/2021   Procedure: DILATION BALLOON used for esophageal stricture;  Surgeon: Irving Copas., MD;  Location: [facility];  Service: Gastroenterology;;   CARDIAC CATHETERIZATION  2000   chest pains neg   CHOLECYSTECTOMY N/A 02/21/2017   Procedure: LAPAROSCOPIC CHOLECYSTECTOMY WITH INTRAOPERATIVE CHOLANGIOGRAM;  Surgeon: Armandina Gemma, MD;  Location: [facility];  Service: General;  Laterality: N/A;   COLONOSCOPY     multiple   CRANIOTOMY  12/09/2011   nerve decompression right trigeminal    DIRECT LARYNGOSCOPY N/A 02/12/2021   Procedure: DIRECT LARYNGOSCOPY AND BIOPSY POSSIBLE FROZEN;  Surgeon: [name], MD;  Location: [facility];  Service: ENT;  Laterality: N/A;   DOPPLER ECHOCARDIOGRAPHY  2009   nl lv function mild lv dilitation   ESOPHAGOGASTRODUODENOSCOPY (EGD) WITH PROPOFOL N/A 06/21/2021   Procedure: ESOPHAGOGASTRODUODENOSCOPY (EGD) WITH PROPOFOL;  Surgeon:  Irving Copas., MD;  Location: [facility];  Service: Gastroenterology;  Laterality: N/A;  Request Fluoroscopy; Plan in for dilation   ESOPHAGOGASTRODUODENOSCOPY (EGD) WITH PROPOFOL N/A 07/19/2021   Procedure: ESOPHAGOGASTRODUODENOSCOPY (EGD) WITH PROPOFOL;  Surgeon: Rush Landmark Telford Nab., MD;  [facility]: [facility];  Service: Endoscopy;  Laterality: N/A;  Dilation fluoro   ESOPHAGOGASTRODUODENOSCOPY (EGD) WITH PROPOFOL N/A 08/05/2021   Procedure: ESOPHAGOGASTRODUODENOSCOPY (EGD) WITH PROPOFOL - fluoro;  Surgeon: Rush Landmark Telford Nab., MD;  Location: [facility];  Service: Gastroenterology;  Laterality: N/A;   ESOPHAGOGASTRODUODENOSCOPY (EGD) WITH PROPOFOL N/A 08/23/2021   Procedure: ESOPHAGOGASTRODUODENOSCOPY (EGD) WITH PROPOFOL;  Surgeon: [name], MD;  [facility];  Service: Gastroenterology;  Laterality: N/A;  Using Fluoroscopy and esophageal dilation   ESOPHAGOGASTRODUODENOSCOPY (EGD) WITH PROPOFOL N/A 09/22/2021   Procedure: ESOPHAGOGASTRODUODENOSCOPY (EGD) WITH PROPOFOL;  Surgeon: [name], MD;  [facility];  Service: Gastroenterology;  Laterality: N/A;  EGD with savary and flour   ESOPHAGOGASTRODUODENOSCOPY (EGD) WITH PROPOFOL N/A 12/07/2021   Procedure: ESOPHAGOGASTRODUODENOSCOPY (EGD) WITH PROPOFOL;  Surgeon: [name], MD;  [facility];  Service: Gastroenterology;  Laterality: N/A;  Schedule this with Fluroscopy, it is a savary dilation   ESOPHAGOGASTRODUODENOSCOPY (EGD) WITH PROPOFOL N/A 04/05/2022   Procedure: ESOPHAGOGASTRODUODENOSCOPY (EGD) WITH PROPOFOL;  Surgeon: [name], MD;  [facility];  Service: Gastroenterology;  Laterality: N/A;  EGD /savary dilation with fluoro   EYE SURGERY Bilateral    ioc for catatracts   GASTROSTOMY N/A 02/26/2021   Procedure: OPEN GASTROSTOMY TUBE PLACEMENT;  Surgeon: Armandina Gemma, MD;  Location: [facility];  Service: General;  Laterality: N/A;   IR GASTROSTOMY TUBE MOD SED   02/22/2021   IR GASTROSTOMY TUBE REMOVAL  02/10/2022   IR REPLACE G-TUBE SIMPLE WO FLUORO  09/07/2021   laparoscopic gallbladder surgery  02/16/2017   Fax from [place] Bilateral 2002   rt shoulder surgery     SAVORY DILATION N/A 06/21/2021   Procedure: SAVORY DILATION;  Surgeon: Irving Copas., MD;  Location: [facility];  Service: Gastroenterology;  Laterality: N/A;   SAVORY DILATION N/A 07/19/2021   Procedure: SAVORY DILATION;  Surgeon: Rush Landmark Telford Nab., MD;  [facility];  Service: Endoscopy;  Laterality: N/A;   SAVORY DILATION N/A 08/05/2021   Procedure: SAVORY DILATION;  Surgeon: Rush Landmark Telford Nab., MD;  Location: [facility];  Service: Gastroenterology;  Laterality: N/A;   SAVORY DILATION N/A 08/23/2021  Procedure: SAVORY DILATION;  Surgeon: [name], MD;  Location: [facility];  Service: Gastroenterology;  Laterality: N/A;   SAVORY DILATION N/A 09/22/2021   Procedure: SAVORY DILATION;  Surgeon: [name], MD;  [facility];  Service: Gastroenterology;  Laterality: N/A;   SAVORY DILATION N/A 12/07/2021   Procedure: SAVORY DILATION;  Surgeon: [name], MD;  [facility];  Service: Gastroenterology;  Laterality: N/A;   SAVORY DILATION N/A 04/05/2022   Procedure: SAVORY DILATION;  Surgeon: [name], MD;  Location[facility];  Service: Gastroenterology;  Laterality: N/A;    Family History  Problem Relation Age of Onset   Cancer Mother        uterine   Ovarian cancer Mother    Stroke Mother    Alcohol abuse Father    Stroke Father    Diabetes Brother    Cancer Paternal Aunt        leukemia, unknown type   Seizures Daughter    Hypertension Other    Colon cancer Neg Hx    Dementia Neg Hx    Alzheimer's disease Neg Hx     Social History   Socioeconomic History   Marital status: Married    Spouse name: Not on file   Number of children: 2   Years of education: Not on  file   Highest education level: Not on file  Occupational History    Comment: retired Forensic psychologist  Tobacco Use   Smoking status: Never   Smokeless tobacco: Never  Vaping Use   Vaping Use: Never used  Substance and Sexual Activity   Alcohol use: Not Currently    Alcohol/week: 2.0 standard drinks of alcohol    Types: 2 Glasses of wine per week    Comment: occ wine   Drug use: No   Sexual activity: Not on file  Other Topics Concern   Not on file  Social History Narrative   Married   HH of 2-3 (god daughter)   Pets 2 dogs   Non smoker    Child is a Engineer, drilling   G2P2      Caffeine: 2 cups/day   Social Determinants of Health   Financial Resource Strain: Low Risk  (04/12/2022)   Overall Financial Resource Strain (CARDIA)    Difficulty of Paying Living Expenses: Not hard at all  Food Insecurity: No Food Insecurity (04/12/2022)   Hunger Vital Sign    Worried About Running Out of Food in the Last Year: Never true    Ran Out of Food in the Last Year: Never true  Transportation Needs: No Transportation Needs (04/12/2022)   PRAPARE - Hydrologist (Medical): No    Lack of Transportation (Non-Medical): No  Physical Activity: Insufficiently Active (04/12/2022)   Exercise Vital Sign    Days of Exercise per Week: 3 days    Minutes of Exercise per Session: 30 min  Stress: No Stress Concern Present (04/12/2022)   [place]    Feeling of Stress : Not at all  Social Connections: Moderately Isolated (04/12/2022)   Social Connection and Isolation Panel [NHANES]    Frequency of Communication with Friends and Family: More than three times a week    Frequency of Social Gatherings with Friends and Family: More than three times a week    Attends Religious Services: Never    Marine scientist or Organizations: No    Attends  Club or Organization Meetings: Never    Marital Status: Married     Outpatient Medications Prior to Visit  Medication Sig Dispense Refill   acetaminophen (TYLENOL) 500 MG tablet Take 500-1,000 mg by mouth every 4 (four) hours as needed for headache, mild pain or moderate pain.     Calcium Carb-Cholecalciferol 600-20 MG-MCG TABS Take by mouth.     carvedilol (COREG) 25 MG tablet TAKE 1 TABLET(25 MG) BY MOUTH TWICE DAILY WITH A MEAL 60 tablet 2   conjugated estrogens (PREMARIN) vaginal cream Place 1 applicator vaginally once a week.     Ferrous Sulfate (IRON PO) Take 1 tablet by mouth daily.     fluticasone (FLONASE) 50 MCG/ACT nasal spray Place 2 sprays into both nostrils as needed for allergies or rhinitis. (Patient taking differently: Place 2 sprays into both nostrils daily as needed for allergies or rhinitis.) 16 g 5   furosemide (LASIX) 20 MG tablet TAKE 1 TABLET(20 MG) BY MOUTH DAILY 90 tablet 0   guaiFENesin-codeine 100-10 MG/5ML syrup Place 5 mLs into feeding tube 2 (two) times daily as needed for cough. 236 mL 0   lamoTRIgine (LAMICTAL) 100 MG tablet 100 mg AM and 200 Mg PM 150 tablet 1   loperamide (IMODIUM) 2 MG capsule 2 mg daily as needed for diarrhea or loose stools. Per tube     Multiple Minerals-Vitamins (CAL-MAG-ZINC-D PO) Take 1 tablet by mouth 2 (two) times daily.     Multiple Vitamin (MULTIVITAMIN WITH MINERALS) TABS tablet Place 1 tablet into feeding tube in the morning.     Nutritional Supplements (NUTREN 1.5) LIQD Place 237 mLs into feeding tube in the morning and at bedtime.     ondansetron (ZOFRAN-ODT) 4 MG disintegrating tablet DISSOLVE 1 TO 2 TABLETS(4 TO 8 MG) ON THE TONGUE EVERY 8 HOURS AS NEEDED FOR NAUSEA OR VOMITING 30 tablet 1   Polyethyl Glycol-Propyl Glycol (SYSTANE) 0.4-0.3 % GEL ophthalmic gel Place 1 application into both eyes 2 (two) times daily as needed (dry eyes).     prochlorperazine (COMPAZINE) 10 MG tablet TAKE 1 TABLET(10 MG) BY MOUTH EVERY 6 HOURS AS NEEDED FOR NAUSEA OR VOMITING (Patient taking differently: Take 10  mg by mouth every 6 (six) hours as needed for nausea or vomiting.) 30 tablet 1   mupirocin ointment (BACTROBAN) 2 % APPLY TOPICALLY TO THE AFFECTED AREA TWICE DAILY (Patient not taking: Reported on 04/18/2022)     neomycin-polymyxin-hydrocortisone (CORTISPORIN) 3.5-10000-1 OTIC suspension Apply 1-2 drops daily after soaking and cover with bandaid (Patient not taking: Reported on 04/18/2022) 10 mL 0   benazepril (LOTENSIN) 20 MG tablet TAKE 1 TABLET(20 MG) BY MOUTH TWICE DAILY (Patient not taking: Reported on 04/18/2022) 180 tablet 0   No facility-administered medications prior to visit.     EXAM:  BP 138/84 (BP Location: Right Arm, Patient Position: Sitting, Cuff Size: Normal)   Pulse (!) 58   Temp 97.6 F (36.4 C) (Oral)   Ht 5' 5.25" (1.657 m)   Wt 135 lb (61.2 kg)   SpO2 95%   BMI 22.29 kg/m   Body mass index is 22.29 kg/m. Wt Readings from Last 3 Encounters:  04/18/22 135 lb (61.2 kg)  04/12/22 135 lb (61.2 kg)  04/07/22 136 lb (61.7 kg)    Physical Exam: Vital signs reviewed DPO:EUMP is a well-developed well-nourished alert cooperative    who appearsr stated age in no acute distress.  HEENT: normocephalic atraumatic , Eyes: PERRL EOM's full, conjunctiva clear, Nares: paten,t no deformity  discharge or tenderness., Ears: hearing aids s. Mouth: clear OP, no lesions, edema.  Moist mucous membranes. Dentition in adequate repair. NECK: supple without masses, thyromegaly or bruits. CHEST/PULM:  Clear to auscultation and percussion breath sounds equal no wheeze , rales or rhonchi. No chest wall deformities or tenderness. Breast: normal by inspection . No dimpling, discharge, masses, tenderness or discharge . CV: PMI is nondisplaced, S1 S2 no gallops, murmurs, rubs. Peripheral pulses are full without delay.No JVD .  ABDOMEN: Bowel sounds normal nontender  No guard or rebound, no hepato splenomegal no CVA tenderness. Extremtities:  No clubbing cyanosis or edema, no acute joint  swelling or  feet puffy and  pink  pulses nl  no ulcers  NEURO:  Oriented x3, cranial nerves 3-12 appear to be intact, no obvious focal weakness,gait within normal limits with cane  no abnormal reflexes or  SKIN: No acute rashes normal turgor, color, no bruising or petechiae. PSYCH: Oriented, good eye contact, no obvious depression anxiety, cognition and judgment appear normal. LN: no cervical axillary iadenopathy  Lab Results  Component Value Date   WBC 4.6 04/07/2022   HGB 13.9 04/07/2022   HCT 42.6 04/07/2022   PLT 145 (L) 04/07/2022   GLUCOSE 92 04/07/2022   CHOL 279 (H) 05/27/2019   TRIG 78.0 05/27/2019   HDL 118.80 05/27/2019   LDLDIRECT 135.9 10/16/2012   LDLCALC 145 (H) 05/27/2019   ALT 18 04/07/2022   AST 22 04/07/2022   NA 142 04/07/2022   K 4.5 04/07/2022   CL 101 04/07/2022   CREATININE 0.94 04/07/2022   BUN 29 (H) 04/07/2022   CO2 38 (H) 04/07/2022   TSH 2.302 04/07/2022   INR 1.1 02/22/2021   HGBA1C 5.2 11/09/2021    BP Readings from Last 3 Encounters:  04/18/22 138/84  04/07/22 (!) 162/82  04/05/22 (!) 183/80    Lab results reviewed   utd x lipids last done 2021   ASSESSMENT AND PLAN:  Discussed the following assessment and plan:    ICD-10-CM   1. Visit for preventive health examination  Z00.00     2. Urinary frequency  R35.0 POCT Urinalysis Dipstick (Automated)    Culture, Urine    Culture, Urine    Ambulatory referral to Urology   poss uti aggravatiing but suspect underlying bladder dysfunction contributing    3. Urge incontinence  N39.41 Ambulatory referral to Urology    4. Medication management  Z79.899     5. History of UTI  Z87.440 Ambulatory referral to Urology    6. 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 Essential hypertension  I10     8. Trigeminal neuralgia  G50.0     9. Hearing aid worn  Z97.4     Check for uti  no acute sx but frequency and Inc type   Will  refer back to urology for this new scnario tx if u cx appropriate  and   disc  poss interventions such as physical therapy and medication  Glad her cancer is at [place] and nutrition seems to be normal now.  Continued fall prevention  \ Continue with oncology and neurology  Dc the benazapril since not taken for a while and bp seems controlled. Today . Return for 6-12 months depending on how doing. .  Patient Care Team: Saad Buhl, [name], MD as PCP - General [name], MD as PCP - Cardiology (Cardiology) Cindie Crumbly (Neurosurgery)  Irine Seal, MD as Attending Physician (Urology) Erline Levine, MD as Consulting Physician (Neurosurgery) [name], MD as Consulting Physician (Neurology) Viona Gilmore, [facility] as Pharmacist (Pharmacist) [name], MD as Consulting Physician (Gastroenterology) Carol Ada, MD as Consulting Physician (Gastroenterology) [name], MD (Inactive) as Consulting Physician (Otolaryngology) [name], MD as Consulting Physician (Hematology) Kyung Rudd, MD as Consulting Physician (Radiation Oncology) Kyung Rudd, MD as Consulting Physician (Radiation Oncology) Patient Instructions  Good  to see you today .  Checking urine for infection.  Possible  let us know if getting  infection symptoms .   And will let you know when culture results are back .  Will do referral back to urology for the frequency and incontinence  .    There may be medications or physical therapy that can help your problem.   YOur BP is good today so I agree holding the  benazepril.  Will review your record  in case other action evaluation advised.  Continue fall prevention  measures      [name]. Kehinde Totzke M.D.

## 2022-04-18 ENCOUNTER — Ambulatory Visit (INDEPENDENT_AMBULATORY_CARE_PROVIDER_SITE_OTHER): Payer: Medicare Other | Admitting: Internal Medicine

## 2022-04-18 ENCOUNTER — Encounter: Payer: Self-pay | Admitting: Internal Medicine

## 2022-04-18 VITALS — BP 138/84 | HR 58 | Temp 97.6°F | Ht 65.25 in | Wt 135.0 lb

## 2022-04-18 DIAGNOSIS — Z79899 Other long term (current) drug therapy: Secondary | ICD-10-CM | POA: Diagnosis not present

## 2022-04-18 DIAGNOSIS — C153 Malignant neoplasm of upper third of esophagus: Secondary | ICD-10-CM

## 2022-04-18 DIAGNOSIS — I10 Essential (primary) hypertension: Secondary | ICD-10-CM

## 2022-04-18 DIAGNOSIS — R350 Frequency of micturition: Secondary | ICD-10-CM

## 2022-04-18 DIAGNOSIS — Z87440 Personal history of urinary (tract) infections: Secondary | ICD-10-CM | POA: Diagnosis not present

## 2022-04-18 DIAGNOSIS — Z0000 Encounter for general adult medical examination without abnormal findings: Secondary | ICD-10-CM

## 2022-04-18 DIAGNOSIS — Z974 Presence of external hearing-aid: Secondary | ICD-10-CM

## 2022-04-18 DIAGNOSIS — N3941 Urge incontinence: Secondary | ICD-10-CM

## 2022-04-18 DIAGNOSIS — G500 Trigeminal neuralgia: Secondary | ICD-10-CM

## 2022-04-18 LAB — POC URINALSYSI DIPSTICK (AUTOMATED)
Bilirubin, UA: NEGATIVE
Blood, UA: NEGATIVE
Glucose, UA: NEGATIVE
Ketones, UA: NEGATIVE
Nitrite, UA: NEGATIVE
Protein, UA: NEGATIVE
Spec Grav, UA: 1.015 (ref 1.010–1.025)
Urobilinogen, UA: 0.2 E.U./dL
pH, UA: 6 (ref 5.0–8.0)

## 2022-04-18 NOTE — Patient Instructions (Addendum)
Good  to see you today .  Checking urine for infection.  Possible  let us know if getting  infection symptoms .   And will let you know when culture results are back .  Will do referral back to urology for the frequency and incontinence  .    There may be medications or physical therapy that can help your problem.   YOur BP is good today so I agree holding the  benazepril.  Will review your record  in case other action evaluation advised.  Continue fall prevention  measures

## 2022-04-19 LAB — URINE CULTURE
MICRO NUMBER:: 14233476
SPECIMEN QUALITY:: ADEQUATE

## 2022-04-20 ENCOUNTER — Other Ambulatory Visit: Payer: Medicare Other

## 2022-04-20 NOTE — Progress Notes (Signed)
No predominant  bacteria                   so no UTI    if you get   more specific uti symptoms  would repeat culture  but otherwise  await urology referral

## 2022-04-22 ENCOUNTER — Telehealth: Payer: Self-pay | Admitting: Internal Medicine

## 2022-04-22 NOTE — Telephone Encounter (Signed)
Smashed 2 fingers and lacerations on arms after an accident at home, requesting a prescription or phone call.

## 2022-04-25 ENCOUNTER — Ambulatory Visit (INDEPENDENT_AMBULATORY_CARE_PROVIDER_SITE_OTHER): Payer: Medicare Other

## 2022-04-25 DIAGNOSIS — L600 Ingrowing nail: Secondary | ICD-10-CM

## 2022-04-25 NOTE — Progress Notes (Signed)
Patient in office in office today for right toenail check after ingrown toenail removal.   Patient denies any nausea, vomiting, fever or chills. Nail has a scab that appears to be healed over at this. Advised patient to discontinue epsom salt soaks at this time and it is fine to leave nailbed open to air, band-aid no longer needed. Patient verbalized understanding.   No further appointment needed at this time. Advised the patient to call the office if any concerns arise.

## 2022-04-26 ENCOUNTER — Ambulatory Visit (INDEPENDENT_AMBULATORY_CARE_PROVIDER_SITE_OTHER): Payer: Medicare Other | Admitting: Adult Health

## 2022-04-26 VITALS — BP 170/90 | HR 62 | Temp 97.6°F | Ht 65.25 in | Wt 136.0 lb

## 2022-04-26 DIAGNOSIS — S60410A Abrasion of right index finger, initial encounter: Secondary | ICD-10-CM

## 2022-04-26 DIAGNOSIS — S60412A Abrasion of right middle finger, initial encounter: Secondary | ICD-10-CM

## 2022-04-26 DIAGNOSIS — S41112A Laceration without foreign body of left upper arm, initial encounter: Secondary | ICD-10-CM

## 2022-04-26 DIAGNOSIS — S41111A Laceration without foreign body of right upper arm, initial encounter: Secondary | ICD-10-CM

## 2022-04-26 DIAGNOSIS — S51811A Laceration without foreign body of right forearm, initial encounter: Secondary | ICD-10-CM | POA: Diagnosis not present

## 2022-04-26 DIAGNOSIS — Z23 Encounter for immunization: Secondary | ICD-10-CM | POA: Diagnosis not present

## 2022-04-26 DIAGNOSIS — T148XXA Other injury of unspecified body region, initial encounter: Secondary | ICD-10-CM

## 2022-04-26 MED ORDER — DOXYCYCLINE HYCLATE 100 MG PO CAPS: 100.0000 mg | ORAL_CAPSULE | Freq: Two times a day (BID) | ORAL | 0 refills | Status: DC

## 2022-04-26 NOTE — Progress Notes (Unsigned)
Subjective:    Patient ID: [name], female    DOB: 1940-06-14, 81 y.o.   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  She is a patient of Dr. Regis Bill who I am seeing today for an acute issue.   She reports that about a week ago she was moving a TV and the cabinet that the TV was in fell on top of her, causing multiple skin lacerations.  She has this skin laceration Steri-Stripped and has been trying to keep them clean and dry.  She has not had fevers or chills.   She is not up to date on tetanus booster   Review of Systems See HPI   Past Medical History:  Diagnosis Date   Abdominal pain 05/29/2013   s/p rx of cephalo resistant e coli   but last rx NG  now residular ?  bladder sx repeat cx sx rx to ty and uro consult    ADJ DISORDER WITH MIXED ANXIETY & DEPRESSED MOOD 03/03/2010   Qualifier: Diagnosis of  By: [name], [name]    Agent resistant to multiple antibiotics 05/29/2013   e coli   bu NG on fu.     Anemia    Anxiety    ARF (acute renal failure) ([facility]) 03/12/2015   Closed head injury 02/01/2011   from syncope and had scalp laceration  neg ct .     Closed head injury 5-6 yrs ago   Colitis  62/26/3335   Complication of anesthesia    migraine several hours after general anesthesia   Depression    esophageal ca 01/2021   Fatty liver    Gall stones 2016   see ct scan neg [facility]    GERD (gastroesophageal reflux disease)    Hearing aid worn    HOH (hard of hearing)    both ears   Hyperlipidemia    Hypertension    echo nl lv function  mild dilitation 2009   Kidney infection    few yrs ago in hospital   Medication side effect 09/02/2010   Poss muscle se of 10 crestor    Migraine    hypnic HA eval by Dr. 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    Pyelonephritis 03/12/2015   Sensation of pain in anesthetized distribution of trigeminal nerve    Syncope 02/01/2011   In shower on vacation  sustained head laceration  8 sutures Had ed visit neg head ct labs and x ray    Trigeminal neuralgia pain     Social History   Socioeconomic History   Marital status: Married    Spouse name: Not on file   Number of children: 2   Years of education: Not on file   Highest education level: Not on file  Occupational History    Comment: retired Forensic psychologist  Tobacco  Use   Smoking status: Never   Smokeless tobacco: Never  Vaping Use   Vaping Use: Never used  Substance and Sexual Activity   Alcohol use: Not Currently    Alcohol/week: 2.0 standard drinks of alcohol    Types: 2 Glasses of wine per week    Comment: occ wine   Drug use: No   Sexual activity: Not on file  Other Topics Concern   Not on file  Social History Narrative   Married   HH of 2-3 (god daughter)   Pets 2 dogs   Non smoker    Child is a physician   G2P2      Caffeine: 2 cups/day   Social Determinants of Health   Financial Resource Strain: Low Risk  (04/12/2022)   Overall Financial Resource Strain (CARDIA)    Difficulty of Paying Living Expenses: Not hard at all  Food Insecurity: No Food Insecurity (04/12/2022)   Hunger Vital Sign    Worried About Running Out of Food in  the Last Year: Never true    [place] in the Last Year: Never true  Transportation Needs: No Transportation Needs (04/12/2022)   PRAPARE - Hydrologist (Medical): No    Lack of Transportation (Non-Medical): No  Physical Activity: Insufficiently Active (04/12/2022)   Exercise Vital Sign    Days of Exercise per Week: 3 days    Minutes of Exercise per Session: 30 min  Stress: No Stress Concern Present (04/12/2022)   [place]    Feeling of Stress : Not at all  Social Connections: Moderately Isolated (04/12/2022)   Social Connection and Isolation Panel [NHANES]    Frequency of Communication with Friends and Family: More than three times a week    Frequency of Social Gatherings with Friends and Family: More than three times a week    Attends Religious Services: Never    Marine scientist or Organizations: No    Attends Archivist Meetings: Never    Marital Status: Married  Human resources officer Violence: Not At Risk (04/12/2022)   Humiliation, Afraid, Rape, and Kick questionnaire    Fear of Current or Ex-Partner: No    Emotionally Abused: No    Physically Abused: No    Sexually Abused: No    Past Surgical History:  Procedure Laterality Date   ABDOMINAL HYSTERECTOMY  2002   tubal   BACK SURGERY     2 times, for sciatic nerve pain   BALLOON DILATION  06/21/2021   Procedure: DILATION BALLOON used for esophageal stricture;  Surgeon: Irving Copas., MD;  Location: [facility];  Service: Gastroenterology;;   CARDIAC CATHETERIZATION  2000   chest pains neg   CHOLECYSTECTOMY N/A 02/21/2017   Procedure: LAPAROSCOPIC CHOLECYSTECTOMY WITH INTRAOPERATIVE CHOLANGIOGRAM;  Surgeon: Armandina Gemma, MD;  Location: [facility];  Service: General;  Laterality: N/A;   COLONOSCOPY     multiple   CRANIOTOMY  12/09/2011   nerve decompression right trigeminal    DIRECT LARYNGOSCOPY N/A  02/12/2021   Procedure: DIRECT LARYNGOSCOPY AND BIOPSY POSSIBLE FROZEN;  Surgeon: [name], MD;  Location: [place];  Service: ENT;  Laterality: N/A;   DOPPLER ECHOCARDIOGRAPHY  2009   nl lv function mild lv dilitation   ESOPHAGOGASTRODUODENOSCOPY (EGD) WITH PROPOFOL N/A 06/21/2021   Procedure: ESOPHAGOGASTRODUODENOSCOPY (EGD) WITH PROPOFOL;  Surgeon: Irving Copas., MD;  Location: [facility];  Service: Gastroenterology;  Laterality:  N/A;  Request Fluoroscopy; Plan in for dilation   ESOPHAGOGASTRODUODENOSCOPY (EGD) WITH PROPOFOL N/A 07/19/2021   Procedure: ESOPHAGOGASTRODUODENOSCOPY (EGD) WITH PROPOFOL;  Surgeon: Rush Landmark Telford Nab., MD;  Location: [facility];  Service: Endoscopy;  Laterality: N/A;  Dilation fluoro   ESOPHAGOGASTRODUODENOSCOPY (EGD) WITH PROPOFOL N/A 08/05/2021   Procedure: ESOPHAGOGASTRODUODENOSCOPY (EGD) WITH PROPOFOL - fluoro;  Surgeon: Rush Landmark Telford Nab., MD;  Location: [place] [place];  Service: Gastroenterology;  Laterality: N/A;   ESOPHAGOGASTRODUODENOSCOPY (EGD) WITH PROPOFOL N/A 08/23/2021   Procedure: ESOPHAGOGASTRODUODENOSCOPY (EGD) WITH PROPOFOL;  Surgeon: [name], MD;  [facility];  Service: Gastroenterology;  Laterality: N/A;  Using Fluoroscopy and esophageal dilation   ESOPHAGOGASTRODUODENOSCOPY (EGD) WITH PROPOFOL N/A 09/22/2021   Procedure: ESOPHAGOGASTRODUODENOSCOPY (EGD) WITH PROPOFOL;  Surgeon: [name], MD;  [facility];  Service: Gastroenterology;  Laterality: N/A;  EGD with savary and flour   ESOPHAGOGASTRODUODENOSCOPY (EGD) WITH PROPOFOL N/A 12/07/2021   Procedure: ESOPHAGOGASTRODUODENOSCOPY (EGD) WITH PROPOFOL;  Surgeon: [name], MD;  [facility]: [facility];  Service: Gastroenterology;  Laterality: N/A;  Schedule this with Fluroscopy, it is a savary dilation   ESOPHAGOGASTRODUODENOSCOPY (EGD) WITH PROPOFOL N/A 04/05/2022   Procedure: ESOPHAGOGASTRODUODENOSCOPY (EGD) WITH  PROPOFOL;  Surgeon: [name], MD;  [facility];  Service: Gastroenterology;  Laterality: N/A;  EGD /savary dilation with fluoro   EYE SURGERY Bilateral    ioc for catatracts   GASTROSTOMY N/A 02/26/2021   Procedure: OPEN GASTROSTOMY TUBE PLACEMENT;  Surgeon: Armandina Gemma, MD;  Location: [facility];  Service: General;  Laterality: N/A;   IR GASTROSTOMY TUBE MOD SED  02/22/2021   IR GASTROSTOMY TUBE REMOVAL  02/10/2022   IR REPLACE G-TUBE SIMPLE WO FLUORO  09/07/2021   laparoscopic gallbladder surgery  02/16/2017   Fax from [place] Bilateral 2002   rt shoulder surgery     SAVORY DILATION N/A 06/21/2021   Procedure: SAVORY DILATION;  Surgeon: Irving Copas., MD;  Location: [facility];  Service: Gastroenterology;  Laterality: N/A;   SAVORY DILATION N/A 07/19/2021   Procedure: SAVORY DILATION;  Surgeon: Rush Landmark Telford Nab., MD;  Location: [facility];  Service: Endoscopy;  Laterality: N/A;   SAVORY DILATION N/A 08/05/2021   Procedure: SAVORY DILATION;  Surgeon: Rush Landmark Telford Nab., MD;  Location: [place];  Service: Gastroenterology;  Laterality: N/A;   SAVORY DILATION N/A 08/23/2021   Procedure: SAVORY DILATION;  Surgeon: [name], MD;  Location: [facility];  Service: Gastroenterology;  Laterality: N/A;   SAVORY DILATION N/A 09/22/2021   Procedure: SAVORY DILATION;  Surgeon: [name], MD;  Location: [facility];  Service: Gastroenterology;  Laterality: N/A;   SAVORY DILATION N/A 12/07/2021   Procedure: SAVORY DILATION;  Surgeon: [name], MD;  Location: [facility];  Service: Gastroenterology;  Laterality: N/A;   SAVORY DILATION N/A 04/05/2022   Procedure: SAVORY DILATION;  Surgeon: [name], MD;  Location: [facility];  Service: Gastroenterology;  Laterality: N/A;    Family History  Problem Relation Age of Onset   Cancer Mother        uterine   Ovarian cancer Mother    Stroke Mother    Alcohol  abuse Father    Stroke Father    Diabetes Brother    Cancer Paternal Aunt        leukemia, unknown type   Seizures Daughter    Hypertension Other    Colon cancer Neg Hx    Dementia Neg Hx    Alzheimer's  disease Neg Hx     Allergies  Allergen Reactions   Bactrim [Sulfamethoxazole-Trimethoprim] Hives and Itching   Hydrocodone Other (See Comments)    Rebound headaches   Sulfa Antibiotics Other (See Comments)    Current Outpatient Medications on File Prior to Visit  Medication Sig Dispense Refill   acetaminophen (TYLENOL) 500 MG tablet Take 500-1,000 mg by mouth every 4 (four) hours as needed for headache, mild pain or moderate pain.     Calcium Carb-Cholecalciferol 600-20 MG-MCG TABS Take by mouth.     carvedilol (COREG) 25 MG tablet TAKE 1 TABLET(25 MG) BY MOUTH TWICE DAILY WITH A MEAL 60 tablet 2   conjugated estrogens (PREMARIN) vaginal cream Place 1 applicator vaginally once a week.     Ferrous Sulfate (IRON PO) Take 1 tablet by mouth daily.     fluticasone (FLONASE) 50 MCG/ACT nasal spray Place 2 sprays into both nostrils as needed for allergies or rhinitis. (Patient taking differently: Place 2 sprays into both nostrils daily as needed for allergies or rhinitis.) 16 g 5   furosemide (LASIX) 20 MG tablet TAKE 1 TABLET(20 MG) BY MOUTH DAILY 90 tablet 0   guaiFENesin-codeine 100-10 MG/5ML syrup Place 5 mLs into feeding tube 2 (two) times daily as needed for cough. 236 mL 0   lamoTRIgine (LAMICTAL) 100 MG tablet 100 mg AM and 200 Mg PM 150 tablet 1   loperamide (IMODIUM) 2 MG capsule 2 mg daily as needed for diarrhea or loose stools. Per tube     Multiple Minerals-Vitamins (CAL-MAG-ZINC-D PO) Take 1 tablet by mouth 2 (two) times daily.     Multiple Vitamin (MULTIVITAMIN WITH MINERALS) TABS tablet Place 1 tablet into feeding tube in the morning.     mupirocin ointment (BACTROBAN) 2 %      neomycin-polymyxin-hydrocortisone (CORTISPORIN) 3.5-10000-1 OTIC suspension Apply 1-2 drops daily  after soaking and cover with bandaid 10 mL 0   Nutritional Supplements (NUTREN 1.5) LIQD Place 237 mLs into feeding tube in the morning and at bedtime.     ondansetron (ZOFRAN-ODT) 4 MG disintegrating tablet DISSOLVE 1 TO 2 TABLETS(4 TO 8 MG) ON THE TONGUE EVERY 8 HOURS AS NEEDED FOR NAUSEA OR VOMITING 30 tablet 1   Polyethyl Glycol-Propyl Glycol (SYSTANE) 0.4-0.3 % GEL ophthalmic gel Place 1 application into both eyes 2 (two) times daily as needed (dry eyes).     prochlorperazine (COMPAZINE) 10 MG tablet TAKE 1 TABLET(10 MG) BY MOUTH EVERY 6 HOURS AS NEEDED FOR NAUSEA OR VOMITING (Patient taking differently: Take 10 mg by mouth every 6 (six) hours as needed for nausea or vomiting.) 30 tablet 1   No current facility-administered medications on file prior to visit.    BP (!) 170/90   Pulse 62   Temp 97.6 F (36.4 C) (Oral)   Ht 5' 5.25" (1.657 m)   Wt 136 lb (61.7 kg)   SpO2 97%   BMI 22.46 kg/m       Objective:   Physical Exam Vitals and nursing note reviewed.  Constitutional:      Appearance: Normal appearance.  Skin:    General: Skin is warm and dry.     Findings: Abrasion present.          Comments: She has a 5 cm skin tear of her left upper arm, a 3 cm skin tear of her right upper arm, and a 4 cm skin tear of her right lower arm.  Signs of infection from the skin tears  She also has  small superficial abrasions on her right pointer finger and right middle finger at the distal tip.  These abrasions appear red and swollen.  Neurological:     General: No focal deficit present.     Mental Status: She is alert and oriented to person, place, and time.  Psychiatric:        Mood and Affect: Mood normal.        Behavior: Behavior normal.        Thought Content: Thought content normal.        Judgment: Judgment normal.       Assessment & Plan:  1. Multiple skin tears -Wounds dressed with nonstick gauze and Curlex.  She was advised to take off bandages before shower, wash with  soap and water and then rebandaged for the next week.  After that she can keep the bandages off.  We will place her on doxycycline for concern of infection at the distal tips of both fingers on her right hand.  Follow-up if signs of infection develop - doxycycline (VIBRAMYCIN) 100 MG capsule; Take 1 capsule (100 mg total) by mouth 2 (two) times daily.  Dispense: 14 capsule; Refill: 0  2. Need for Tdap vaccination  - Tdap vaccine greater than or equal to 7yo IM  Dorothyann Peng, NP  Time spent with patient today was 32 minutes which consisted of chart review, discussing diagnosis, work up, treatment answering questions and documentation.

## 2022-04-26 NOTE — Patient Instructions (Addendum)
It was nice meeting you today   I have sent in some antibiotics called Doxycycline, take this twice a day for 7 days  Keep the wounds covered with non stick gauze and kerlex dressing. Take off when you shower and use warm water and soap to cleanse the wounds.   Soak your wounds on the fingers in warm water and soap for 10 minutes a couple of times a day   Follow up if you have any signs of infection

## 2022-04-29 ENCOUNTER — Telehealth: Payer: Self-pay | Admitting: Pharmacist

## 2022-04-29 NOTE — Progress Notes (Signed)
Chronic Care Management Pharmacy Assistant   Name: [name]  MRN: [id number] DOB: January 21, 1941  Reason for Encounter: Disease State   Conditions to be addressed/monitored: HTN  Recent office visits:  04/26/22 Angela Peng, NP - Patient presented for Multiple skin tears and other concerns. Prescribed Doxycycline.  04/18/22 [name], [name], MD - Patient presented for Preventative health examination and other concerns. Stopped Benazepril.  04/12/22 Angela Peaches, LPN - Patient presented for Medicare Annual Wellness Exam. No medication changes.    Recent consult visits:  04/25/22 [name], [name], RN - Claims encounter for ingrowing right great toenail. No medication changes   04/11/22 Angela Merle, MD (Oncology) - Patient presented for Malignant neoplasm of upper third esophagus. No medication changes.   04/07/22 - Patient presented to [name] for infusion.   03/30/22 [name], DPM  - Patient presented for Ingrown right toenail. No medication changes.   03/17/22 Angela Beam, MD (Neurology) - Patient presented for Numbness and tingling of right face and other concerns. No medication changes.   02/10/22 Angela Merle, MD (Oncology) - Patient presented for Malignant neoplasm of upper third esophagus. No medication changes.   Hospital visits:  Medication Reconciliation was completed by comparing discharge summary, patient's EMR and Pharmacy list, and upon discussion with patient.  Patient presented to [facility] on 04/05/22 due to Esophageal stricture Dysphagia unspecified type. Patient was present for 2 hours.   New?Medications Started at [facility] Discharge:?? -started  levETIRAcetam 100 MG/ML solution (KEPPRA) omeprazole 20 MG capsule (PRILOSEC) sucralfate 1 GM/10ML suspension (CARAFATE  Medication Changes at Hospital Discharge: -Changed  lamoTRIgine (LAMICTAL  Medications Discontinued at Hospital  Discharge: -Stopped  None  Medications that remain the same after Hospital Discharge:??  -All other medications will remain the same.    Medication Reconciliation was completed by comparing discharge summary, patient's EMR and Pharmacy list, and upon discussion with patient.   Patient presented to [facility] 12[facility]07[facility]21 due to EGD with Propofol. Patient was present for 3 hours.   New?Medications Started at [facility] Discharge:?? -started  none   Medication Changes at Hospital Discharge: -Changed  none   Medications Discontinued at Hospital Discharge: -Stopped  None   Medications that remain the same after Hospital Discharge:??  -All other medications will remain the same  Medications: Outpatient Encounter Medications as of 04/29/2022  Medication Sig   acetaminophen (TYLENOL) 500 MG tablet Take 500-1,000 mg by mouth every 4 (four) hours as needed for headache, mild pain or moderate pain.   Calcium Carb-Cholecalciferol 600-20 MG-MCG TABS Take by mouth.   carvedilol (COREG) 25 MG tablet TAKE 1 TABLET(25 MG) BY MOUTH TWICE DAILY WITH A MEAL   conjugated estrogens (PREMARIN) vaginal cream Place 1 applicator vaginally once a week.   doxycycline (VIBRAMYCIN) 100 MG capsule Take 1 capsule (100 mg total) by mouth 2 (two) times daily.   Ferrous Sulfate (IRON PO) Take 1 tablet by mouth daily.   fluticasone (FLONASE) 50 MCG/ACT nasal spray Place 2 sprays into both nostrils as needed for allergies or rhinitis. (Patient taking differently: Place 2 sprays into both nostrils daily as needed for allergies or rhinitis.)   furosemide (LASIX) 20 MG tablet TAKE 1 TABLET(20 MG) BY MOUTH DAILY   guaiFENesin-codeine 100-10 MG/5ML syrup Place 5 mLs into feeding tube 2 (two) times daily as needed for cough.   lamoTRIgine (LAMICTAL) 100 MG tablet 100 mg AM and 200 Mg PM   loperamide (IMODIUM) 2  MG capsule 2 mg daily as needed for diarrhea or loose stools. Per tube   Multiple Minerals-Vitamins  (CAL-MAG-ZINC-D PO) Take 1 tablet by mouth 2 (two) times daily.   Multiple Vitamin (MULTIVITAMIN WITH MINERALS) TABS tablet Place 1 tablet into feeding tube in the morning.   mupirocin ointment (BACTROBAN) 2 %    neomycin-polymyxin-hydrocortisone (CORTISPORIN) 3.5-10000-1 OTIC suspension Apply 1-2 drops daily after soaking and cover with bandaid   Nutritional Supplements (NUTREN 1.5) LIQD Place 237 mLs into feeding tube in the morning and at bedtime.   ondansetron (ZOFRAN-ODT) 4 MG disintegrating tablet DISSOLVE 1 TO 2 TABLETS(4 TO 8 MG) ON THE TONGUE EVERY 8 HOURS AS NEEDED FOR NAUSEA OR VOMITING   Polyethyl Glycol-Propyl Glycol (SYSTANE) 0.4-0.3 % GEL ophthalmic gel Place 1 application into both eyes 2 (two) times daily as needed (dry eyes).   prochlorperazine (COMPAZINE) 10 MG tablet TAKE 1 TABLET(10 MG) BY MOUTH EVERY 6 HOURS AS NEEDED FOR NAUSEA OR VOMITING (Patient taking differently: Take 10 mg by mouth every 6 (six) hours as needed for nausea or vomiting.)   No facility-administered encounter medications on file as of 04/29/2022.   Reviewed chart prior to disease state call. Spoke with patient regarding BP  Recent Office Vitals: BP Readings from Last 3 Encounters:  04/26/22 (!) 170/90  04/18/22 138/84  04/07/22 (!) 162/82   Pulse Readings from Last 3 Encounters:  04/26/22 62  04/18/22 (!) 58  04/07/22 (!) 59    Wt Readings from Last 3 Encounters:  04/26/22 136 lb (61.7 kg)  04/18/22 135 lb (61.2 kg)  04/12/22 135 lb (61.2 kg)     Kidney Function Lab Results  Component Value Date/Time   CREATININE 0.94 04/07/2022 03:04 PM   CREATININE 0.93 03/10/2022 12:59 PM   GFR 54.44 (L) 12/27/2021 03:16 PM   GFRNONAA >60 04/07/2022 03:04 PM   GFRAA 62 07/08/2020 04:53 PM       Latest Ref Rng & Units 04/07/2022    3:04 PM 03/10/2022   12:59 PM 02/10/2022    8:59 AM  BMP  Glucose 70 - 99 mg/dL 92  100  215   BUN 8 - 23 mg/dL 29  26  20    Creatinine 0.44 - 1.00 mg/dL 0.94  0.93   0.99   Sodium 135 - 145 mmol/L 142  141  140   Potassium 3.5 - 5.1 mmol/L 4.5  4.5  3.9   Chloride 98 - 111 mmol/L 101  103  105   CO2 22 - 32 mmol/L 38  34  30   Calcium 8.9 - 10.3 mg/dL 10.6  10.0  9.5     Current antihypertensive regimen:  Carvedilol 25 mg 1 tablet twice daily - Appropriate, Query effective, Safe, Accessible  Furosemide 20 mg 1 tablet daily - Appropriate, Query effective, Safe, Accessible How often are you checking your Blood Pressure? infrequently Current home BP readings: Patient reports her pressures have been fine at home and not as high as her most recent visit to the office, she reports she can not recall specific readings to report and did not have time to locate her cuff to check while we were on the phone. She denies any hyper/hypotensive symptoms and confirms she is not using the Benazepril but is compliant with the use of her Carvedilol and Furosemide.  What recent interventions/DTPs have been made by any provider to improve Blood Pressure control since last CPP Visit: Benazepril 20 mg 1 tablet twice daily - Appropriate, Query effective,  Safe, Accessible (stopped by PCP)  Any recent hospitalizations or ED visits since last visit with CPP? Yes Adherence Review: Is the patient currently on ACE/ARB medication? No Does the patient have >5 day gap between last estimated fill dates? No     Care Gaps: BP- 170/90 04/26/22 AWV- 04/18/23 scheduled CCM- 06/2022  Star Rating Drugs: None    Ned Clines [place] Clinical Pharmacist Assistant [phone]

## 2022-05-02 DIAGNOSIS — H53483 Generalized contraction of visual field, bilateral: Secondary | ICD-10-CM | POA: Diagnosis not present

## 2022-05-02 DIAGNOSIS — H53482 Generalized contraction of visual field, left eye: Secondary | ICD-10-CM | POA: Diagnosis not present

## 2022-05-02 DIAGNOSIS — H53481 Generalized contraction of visual field, right eye: Secondary | ICD-10-CM | POA: Diagnosis not present

## 2022-05-05 ENCOUNTER — Inpatient Hospital Stay: Payer: Medicare Other | Attending: Nurse Practitioner

## 2022-05-05 ENCOUNTER — Inpatient Hospital Stay: Payer: Medicare Other

## 2022-05-05 ENCOUNTER — Other Ambulatory Visit: Payer: Self-pay

## 2022-05-05 VITALS — BP 165/84 | HR 64 | Temp 97.9°F | Resp 16 | Wt 135.5 lb

## 2022-05-05 DIAGNOSIS — C153 Malignant neoplasm of upper third of esophagus: Secondary | ICD-10-CM | POA: Insufficient documentation

## 2022-05-05 DIAGNOSIS — Z5112 Encounter for antineoplastic immunotherapy: Secondary | ICD-10-CM | POA: Insufficient documentation

## 2022-05-05 LAB — CBC WITH DIFFERENTIAL (CANCER CENTER ONLY)
Abs Immature Granulocytes: 0 10*3/uL (ref 0.00–0.07)
Basophils Absolute: 0 10*3/uL (ref 0.0–0.1)
Basophils Relative: 1 %
Eosinophils Absolute: 0.2 10*3/uL (ref 0.0–0.5)
Eosinophils Relative: 4 %
HCT: 45.4 % (ref 36.0–46.0)
Hemoglobin: 15.1 g/dL — ABNORMAL HIGH (ref 12.0–15.0)
Immature Granulocytes: 0 %
Lymphocytes Relative: 16 %
Lymphs Abs: 0.8 10*3/uL (ref 0.7–4.0)
MCH: 30.3 pg (ref 26.0–34.0)
MCHC: 33.3 g/dL (ref 30.0–36.0)
MCV: 91 fL (ref 80.0–100.0)
Monocytes Absolute: 0.4 10*3/uL (ref 0.1–1.0)
Monocytes Relative: 9 %
Neutro Abs: 3.5 10*3/uL (ref 1.7–7.7)
Neutrophils Relative %: 70 %
Platelet Count: 158 10*3/uL (ref 150–400)
RBC: 4.99 MIL/uL (ref 3.87–5.11)
RDW: 13.5 % (ref 11.5–15.5)
WBC Count: 4.9 10*3/uL (ref 4.0–10.5)
nRBC: 0 % (ref 0.0–0.2)

## 2022-05-05 LAB — CMP (CANCER CENTER ONLY)
ALT: 18 U/L (ref 0–44)
AST: 26 U/L (ref 15–41)
Albumin: 4 g/dL (ref 3.5–5.0)
Alkaline Phosphatase: 85 U/L (ref 38–126)
Anion gap: 3 — ABNORMAL LOW (ref 5–15)
BUN: 29 mg/dL — ABNORMAL HIGH (ref 8–23)
CO2: 34 mmol/L — ABNORMAL HIGH (ref 22–32)
Calcium: 10.5 mg/dL — ABNORMAL HIGH (ref 8.9–10.3)
Chloride: 104 mmol/L (ref 98–111)
Creatinine: 0.92 mg/dL (ref 0.44–1.00)
GFR, Estimated: >60 mL/min (ref >60)
Glucose, Bld: 92 mg/dL (ref 70–99)
Potassium: 3.9 mmol/L (ref 3.5–5.1)
Sodium: 141 mmol/L (ref 135–145)
Total Bilirubin: 0.8 mg/dL (ref 0.3–1.2)
Total Protein: 6.2 g/dL — ABNORMAL LOW (ref 6.5–8.1)

## 2022-05-05 MED ORDER — SODIUM CHLORIDE 0.9 % IV SOLN: Freq: Once | INTRAVENOUS | Status: AC

## 2022-05-05 MED ORDER — SODIUM CHLORIDE 0.9 % IV SOLN
480.0000 mg | Freq: Once | INTRAVENOUS | Status: AC
  Administered 2022-05-05: 480 mg via INTRAVENOUS
  Filled 2022-05-05: qty 48

## 2022-05-05 NOTE — Patient Instructions (Addendum)
[facility]   Discharge Instructions: Thank you for choosing [facility] to provide your oncology and hematology care.   If you have a lab appointment with the [facility], please go directly to the [facility] and check in at the registration area.   Wear comfortable clothing and clothing appropriate for easy access to any Portacath or PICC line.   We strive to give you quality time with your provider. You may need to reschedule your appointment if you arrive late (15 or more minutes).  Arriving late affects you and other patients whose appointments are after yours.  Also, if you miss three or more appointments without notifying the office, you may be dismissed from the clinic at the provider's discretion.      For prescription refill requests, have your pharmacy contact our office and allow 72 hours for refills to be completed.    Today you received the following chemotherapy and/or immunotherapy agents: Nivolumab (Opdivo)     To help prevent nausea and vomiting after your treatment, we encourage you to take your nausea medication as directed.  BELOW ARE SYMPTOMS THAT SHOULD BE REPORTED IMMEDIATELY: *FEVER GREATER THAN 100.4 F (38 C) OR HIGHER *CHILLS OR SWEATING *NAUSEA AND VOMITING THAT IS NOT CONTROLLED WITH YOUR NAUSEA MEDICATION *UNUSUAL SHORTNESS OF BREATH *UNUSUAL BRUISING OR BLEEDING *URINARY PROBLEMS (pain or burning when urinating, or frequent urination) *BOWEL PROBLEMS (unusual diarrhea, constipation, pain near the anus) TENDERNESS IN MOUTH AND THROAT WITH OR WITHOUT PRESENCE OF ULCERS (sore throat, sores in mouth, or a toothache) UNUSUAL RASH, SWELLING OR PAIN  UNUSUAL VAGINAL DISCHARGE OR ITCHING   Items with * indicate a potential emergency and should be followed up as soon as possible or go to the Emergency Department if any problems should occur.  Please show the CHEMOTHERAPY ALERT CARD or IMMUNOTHERAPY ALERT CARD at  check-in to the Emergency Department and triage nurse.  Should you have questions after your visit or need to cancel or reschedule your appointment, please contact [facility]  Dept: [phone]  and follow the prompts.  Office hours are 8:00 a.m. to 4:30 p.m. Monday - Friday. Please note that voicemails left after 4:00 p.m. may not be returned until the following business day.  We are closed weekends and major holidays. You have access to a nurse at all times for urgent questions. Please call the main number to the clinic Dept: [phone] and follow the prompts.   For any non-urgent questions, you may also contact your provider using MyChart. We now offer e-Visits for anyone 34 and older to request care online for non-urgent symptoms. For details visit [email or web address].   Also download the MyChart app! Go to the app store, search "MyChart", open the app, select [facility], and log in with your MyChart username and password.  Masks are optional in the cancer centers. If you would like for your care team to wear a mask while they are taking care of you, please let them know. You may have one support person who is at least 82 years old accompany you for your [phone]

## 2022-05-05 NOTE — Progress Notes (Signed)
Ok to treat per Dr. [name]- see assessment. Patient has some tingling developing in the bottoms of her feet. Patient also just finished a week's worth of antibiotics to prevent infection.

## 2022-05-18 ENCOUNTER — Telehealth: Payer: Self-pay | Admitting: Neurology

## 2022-05-18 NOTE — Telephone Encounter (Signed)
Contacted pt back, LVM rq CB

## 2022-05-18 NOTE — Telephone Encounter (Signed)
Pt is calling, Stated she needs to talk to nurse about her Trigeminal neuralgia because it's getting worse and the medication isn't working. Pt is requesting a call back from the nurse.

## 2022-05-19 ENCOUNTER — Other Ambulatory Visit: Payer: Self-pay | Admitting: Internal Medicine

## 2022-06-09 ENCOUNTER — Other Ambulatory Visit: Payer: Self-pay | Admitting: Adult Health

## 2022-06-13 ENCOUNTER — Encounter: Payer: Self-pay | Admitting: Hematology

## 2022-07-01 ENCOUNTER — Encounter: Payer: Self-pay | Admitting: Hematology

## 2022-07-06 IMAGING — RF DG SWALLOWING FUNCTION
13 of 15 series · 17 of 24 positions shown · non-contrast
Comparison: None Available.

CLINICAL DATA: Dysphagia. Cough/GE reflux disease/other secondary
diagnosis

EXAM:
MODIFIED BARIUM SWALLOW
TECHNIQUE: Different consistencies of barium were administered orally to the
patient by the Speech Pathologist. Imaging of the pharynx was
performed in the lateral projection. The radiologist was present in
the fluoroscopy room for this study, providing personal supervision.
FLUOROSCOPY:
Radiation Exposure Index (as provided by the fluoroscopic device):
3.1 mGy Kerma

[Series 4: cp_standard · 1 of 3 frames shown (1 of 13)]
[frame 1/3]
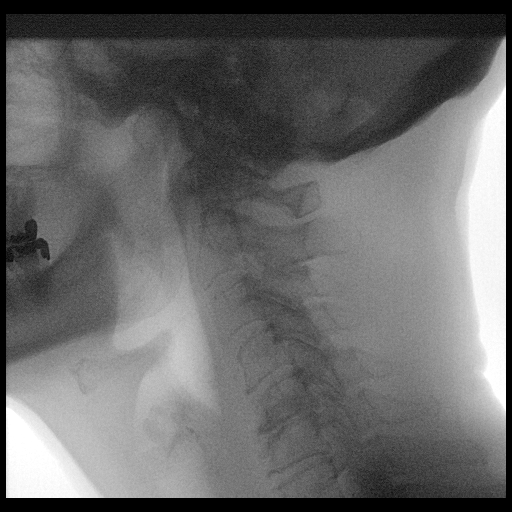

[Series 5: cp_standard · 1 of 4 frames shown (2 of 13)]
[frame 4/4]
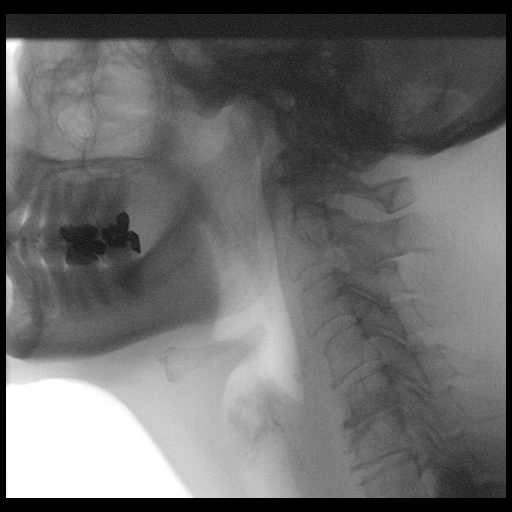

[Series 6: cp_standard · 1 of 182 frames shown (3 of 13)]
[frame 28/182]
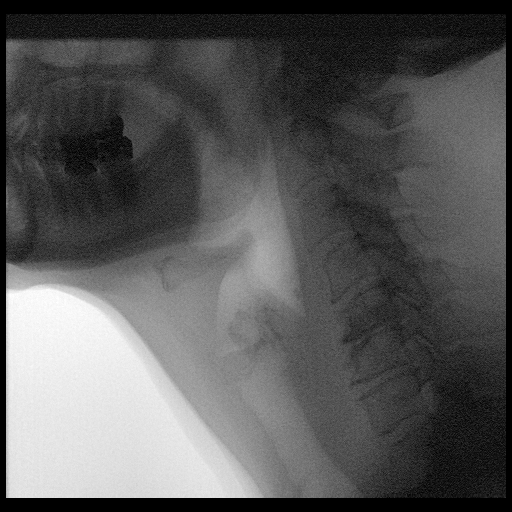

[Series 7: cp_standard · 1 of 124 frames shown (4 of 13)]
[frame 19/124]
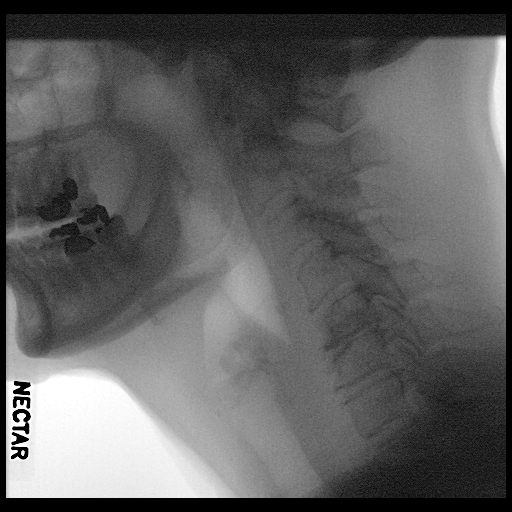

[Series 8: cp_standard · 2 of 56 frames shown (5 of 13)]
[frame 29/56]
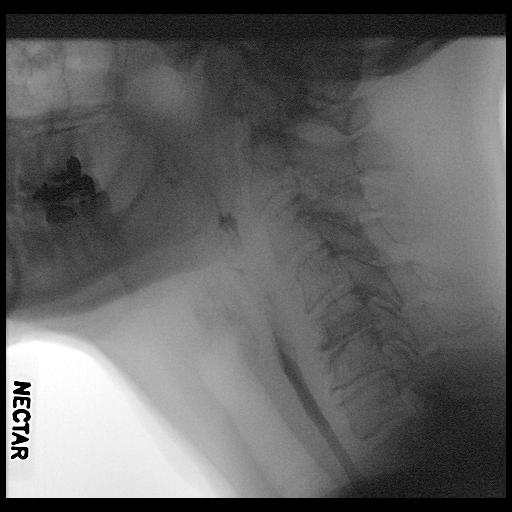
[frame 56/56]
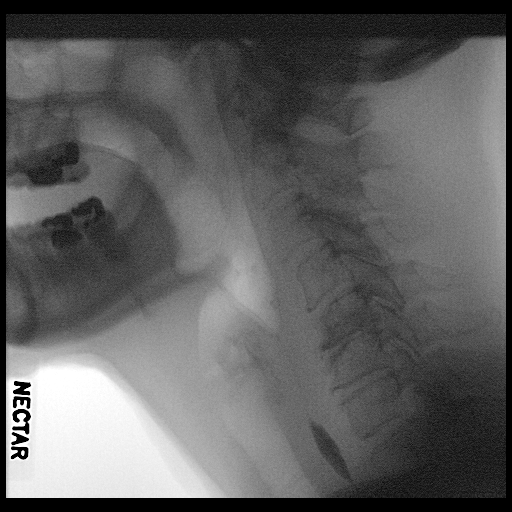

[Series 10: cp_standard · 2 of 46 frames shown (6 of 13)]
[frame 2/46]
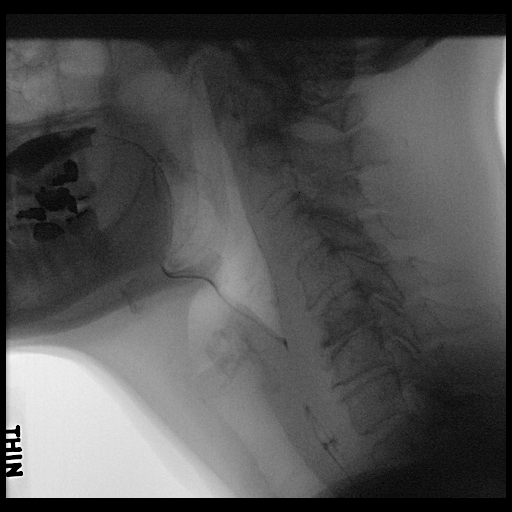
[frame 40/46]
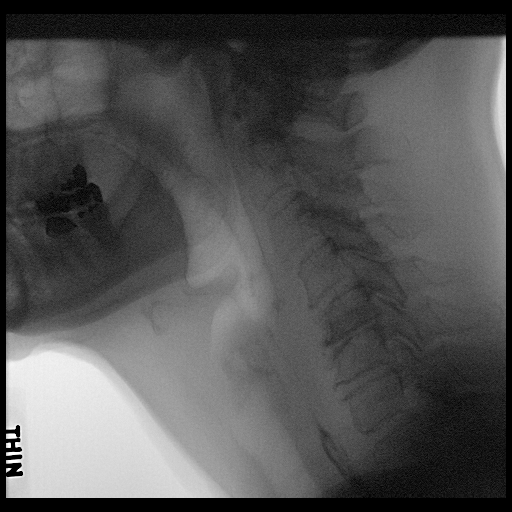

[Series 12: cp_standard · 2 of 125 frames shown (7 of 13)]
[frame 19/125]
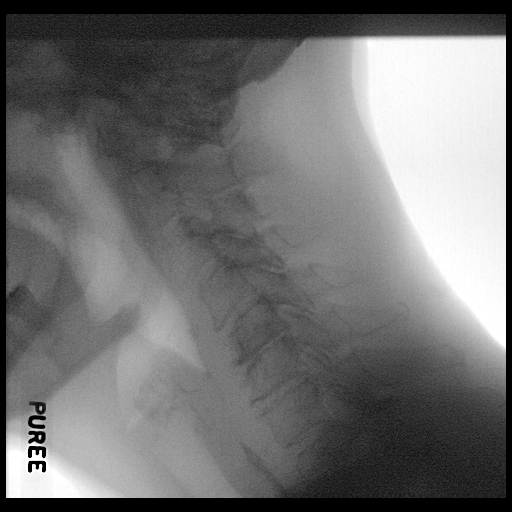
[frame 107/125]
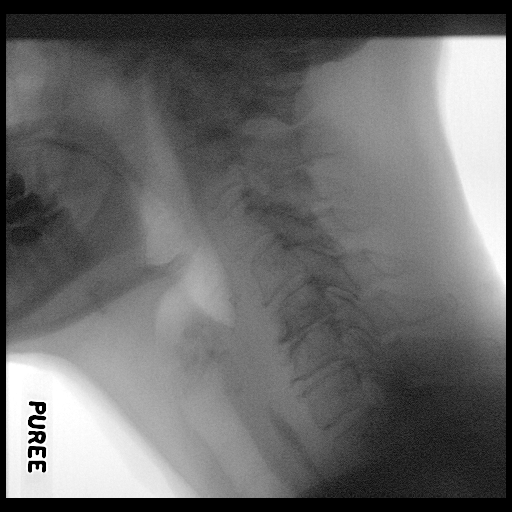

[Series 13: cp_standard · 1 of 184 frames shown (8 of 13)]
[frame 28/184]
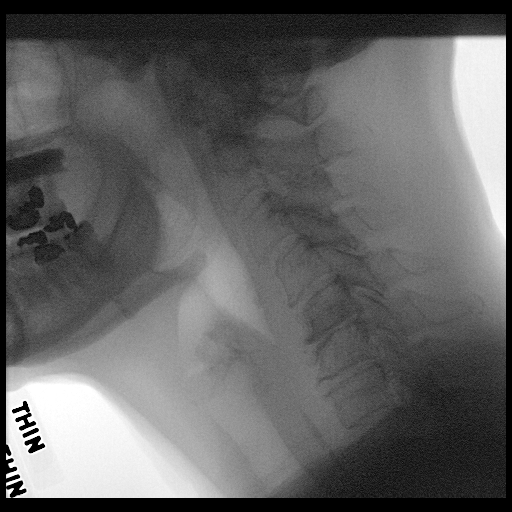

[Series 14: cp_standard · 1 of 26 frames shown (9 of 13)]
[frame 23/26]
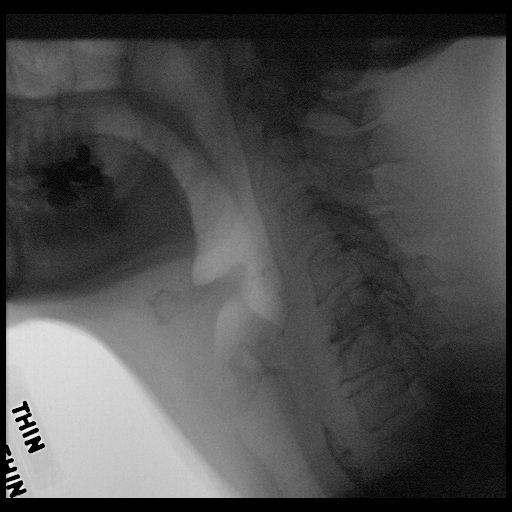

[Series 15: cp_standard · 1 of 347 frames shown (10 of 13)]
[frame 53/347]
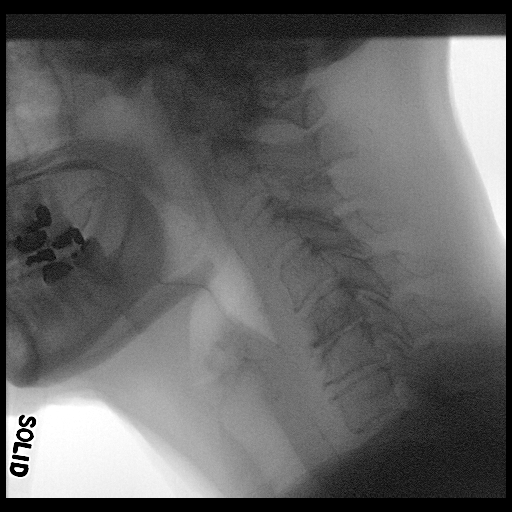

[Series 16: cp_standard · 1 of 54 frames shown (11 of 13)]
[frame 28/54]
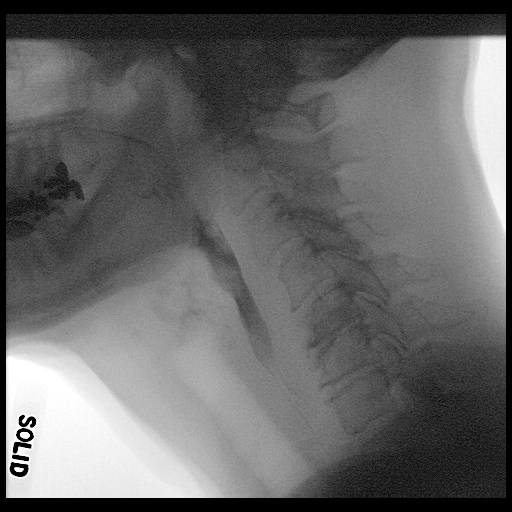

[Series 17: cp_standard · 2 of 433 frames shown (12 of 13)]
[frame 65/433]
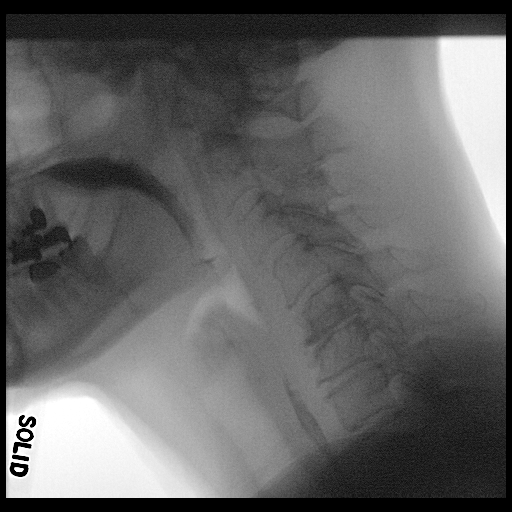
[frame 266/433]
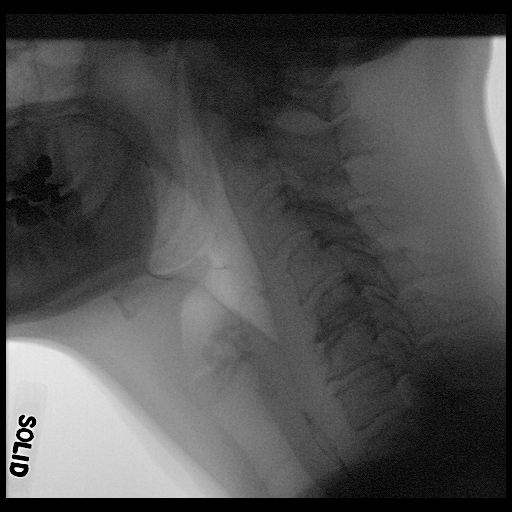

[Series 18: cp_standard · 1 of 62 frames shown (13 of 13)]
[frame 53/62]
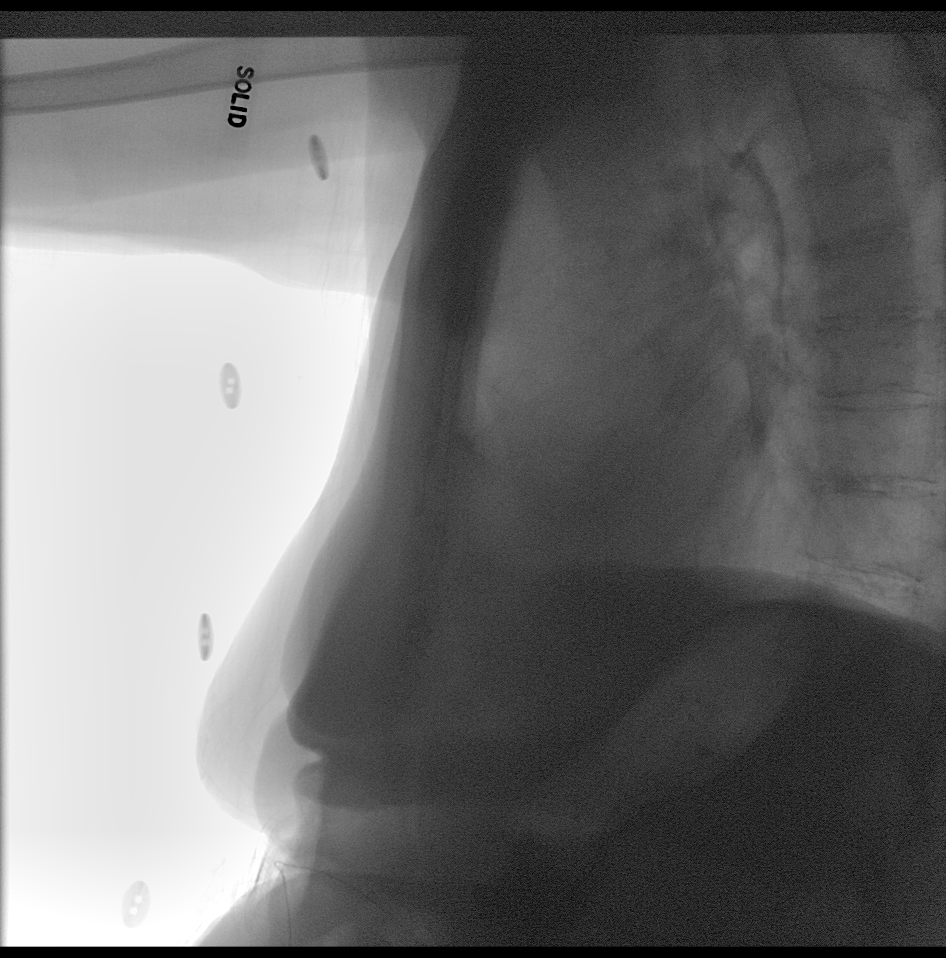

[17 of 24 positions shown; findings below may reference images not displayed]

FINDINGS: Vestibular  Penetration:  None seen.

Aspiration:  None seen.

Other:  None.
IMPRESSION: See speech pathology report for full detail.

Please refer to the Speech Pathologists report for complete details
and recommendations.

## 2022-07-10 ENCOUNTER — Other Ambulatory Visit: Payer: Self-pay | Admitting: Internal Medicine

## 2022-07-11 ENCOUNTER — Encounter: Payer: Self-pay | Admitting: Hematology

## 2022-07-12 ENCOUNTER — Other Ambulatory Visit: Payer: Self-pay

## 2022-07-12 DIAGNOSIS — C153 Malignant neoplasm of upper third of esophagus: Secondary | ICD-10-CM

## 2022-07-12 NOTE — Progress Notes (Signed)
. [facility]   [phone] [phone]   Clinic Follow up Note   Patient Care Team: [name], [name], MD as PCP - General [name], MD as PCP - Cardiology (Cardiology) Cindie Crumbly (Neurosurgery) Irine Seal, MD as Attending Physician (Urology) Erline Levine, MD as Consulting Physician (Neurosurgery) [name], MD as Consulting Physician (Neurology) Viona Gilmore, [facility] (Inactive) as Pharmacist (Pharmacist) [name], MD as Consulting Physician (Gastroenterology) Carol Ada, MD as Consulting Physician (Gastroenterology) [name], MD (Inactive) as Consulting Physician (Otolaryngology) [name], MD as Consulting Physician (Hematology) Kyung Rudd, MD as Consulting Physician (Radiation Oncology) Kyung Rudd, MD as Consulting Physician (Radiation Oncology)  Date of Service:  07/13/2022  CHIEF COMPLAINT: f/u of  esophageal cancer     CURRENT THERAPY:  Surveillance  ASSESSMENT:  [name] is a 82 y.o. 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 EGD 9/15 and CT neck 02/05/21 showed 6 cm mass in the cervical/upper esophagus with possible local adenopathy. 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  -due to the location in the cervical esophagus, Dr. Kipp Brood feels she is not a surgical candidate.  -she received concurrent chemoRT with carbo/taxol 03/08/21 - 04/14/21. She required dose reduction with last cycle due to radiation dermatitis, hoarseness, dysphagia, and odynophagia but was able to complete treatment as scheduled.  -given PDL1+, she began immunotherapy with Nivolumab on 05/21/21, and completed 1 year therapy in December 2023. -last EGD in November  2023 showed benign stenosis, no evidence of recurrence or residual disease. -restaging CT CAP in November 2023 showed no evidence of disease. -She is clinically doing well, lab reviewed, exam was unremarkable, no clinical concern for recurrence. -Will continue surveillance  Cough and orthopnea -Developed in the past months, no sputum production, or chest discomfort. -Will get a CT chest without contrast for further evaluation. -Discussed the possible etiology, such as aspiration, immunotherapy related pneumonitis, or acid reflux.  I suggested her to take omeprazole. -Her oxygen level remained to be normal with 6 minutes walking, no suspicion for PE.   PLAN: -lab reviewed -Order CT chest without contrast to be done in the next 2 weeks -phone visit 07/27/2022  SUMMARY OF ONCOLOGIC HISTORY: Oncology History Overview Note   Cancer Staging  Malignant neoplasm of upper third esophagus ([place]) Staging form: Esophagus - Squamous Cell Carcinoma, AJCC 8th Edition - Clinical stage from 02/12/2021: Stage Unknown (cTX, cN2, cM0) - Signed by [name], MD on 03/08/2021 Stage prefix: Initial diagnosis     Malignant neoplasm of upper third esophagus ([place])  12/21/2020 Imaging   Laryngoscopy Comments:    On fiberoptic laryngoscopy through the right nostril the nasopharynx was clear.  The base of tongue vallecula and epiglottis were normal.    Piriform sinuses were clear bilaterally and vocal cords were clear with  normal vocal mobility.  No structural abnormalities noted.   01/22/2021 Imaging   DG esophagus IMPRESSION: 1. Luminal narrowing in the high cervical esophagus over a 3 cm segment is highly concerning for esophageal neoplasm. Inflammatory process would be a secondary consideration. Luminal narrowing occurs approximately at the C5-C7 vertebral body level just below the glottis. Patient experienced several episodes of choking related to this luminal narrowing. Recommend expedient upper GI endoscopy  for evaluation. 2. Distal thoracic esophagus and  GE junction appear normal.   02/04/2021 Procedure   EGD by Dr. Carlean Purl impression- One mass-like severe stenosis was found in the upper third of the esophagus. The stenosis was not traversed.   02/05/2021 Imaging   CT soft tissue neck w contrast IMPRESSION: Mass-like soft tissue thickening and mucosal hyperenhancement of the cervical esophagus and hypopharynx, spanning the C4-T1 levels, likely reflecting an esophageal malignancy. This measures up to 2.4 x 3.4 cm in transaxial dimensions, and 6.3 cm in craniocaudal dimension. Associated severe effacement of the esophageal lumen.   Centrally necrotic right paratracheal lymph node beneath the level of the mass, measuring 1.3 x 0.9 cm, and likely reflecting a site of nodal metastatic disease.   Additional lymph nodes along the posterior and inferior aspect of the right thyroid lobe (along the right aspect of the esophageal mass), which measure subcentimeter but are asymmetrically prominent and highly suspicious for additional sites of nodal metastatic disease.   02/12/2021 Pathology Results   FINAL MICROSCOPIC DIAGNOSIS:  A. ESOPHAGUS, UPPER CERVICAL #1, BIOPSY:  - Squamous cell carcinoma.  B. ESOPHAGUS, UPPER CERVICAL #2, BIOPSY:  - Squamous cell carcinoma.    02/12/2021 Cancer Staging   Staging form: Esophagus - Squamous Cell Carcinoma, AJCC 8th Edition - Clinical stage from 02/12/2021: Stage Unknown (cTX, cN2, cM0) - Signed by [name], MD on 03/08/2021 Stage prefix: Initial diagnosis   02/18/2021 Initial Diagnosis   Malignant neoplasm of upper third esophagus ([place])   02/23/2021 PET scan   IMPRESSION: Markedly hypermetabolic focal masslike thickening of the cervical esophagus, compatible with primary esophageal malignancy.   Markedly hypermetabolic right upper paratracheal lymph node, compatible with metastatic disease.   Small right upper paratracheal and right level IIA lymph nodes  with mild hypermetabolic activity, concerning for additional sites of nodal metastatic disease.   Aortic Atherosclerosis (ICD10-I70.0).   03/09/2021 - 04/13/2021 Chemotherapy   Patient is on Treatment Plan : ESOPHAGUS Carboplatin/PACLitaxel weekly x 6 weeks with XRT       05/21/2021 - 01/13/2022 Chemotherapy   Patient is on Treatment Plan : GASTROESOPHAGEAL Nivolumab q14d x 8 cycles / Nivolumab q28d     05/21/2021 -  Chemotherapy   Patient is on Treatment Plan : GASTROESOPHAGEAL Nivolumab (240) q14d x 8 cycles / Nivolumab (480) q28d        INTERVAL HISTORY:  [name] is here for a follow up of   esophageal cancer   She was last seen by me on 04/11/2022 She presents to the clinic alone. Pt states she coughs and has dry mouth. Also when she laid down she has SOB. Pt state she get fatigue in the afternoon. She reports she is able to some cleaning. Pt reports that she puts the head of her bed up and uses an extra pillow. Pt feels like its getting worse.    All other systems were reviewed with the patient and are negative.  MEDICAL HISTORY:  Past Medical History:  Diagnosis Date   Abdominal pain 05/29/2013   s/p rx of cephalo resistant e coli   but last rx NG  now residular ?  bladder sx repeat cx sx rx to ty and uro consult    ADJ DISORDER WITH MIXED ANXIETY & DEPRESSED MOOD 03/03/2010   Qualifier: Diagnosis of  By: [name], [name]    Agent resistant to multiple antibiotics 05/29/2013   e coli   bu NG on fu.     Anemia    Anxiety    ARF (acute  renal failure) ([place]) 03/12/2015   Closed head injury 02/01/2011   from syncope and had scalp laceration  neg ct .     Closed head injury 5-6 yrs ago   Colitis [id number]   Complication of anesthesia    migraine several hours after general anesthesia   Depression    esophageal ca 01/2021   Fatty liver    Gall stones 2016   see ct scan neg HIDA    GERD (gastroesophageal reflux disease)    Hearing aid worn    HOH (hard  of hearing)    both ears   Hyperlipidemia    Hypertension    echo nl lv function  mild dilitation 2009   Kidney infection    few yrs ago in hospital   Medication side effect 09/02/2010   Poss muscle se of 10 crestor    Migraine    hypnic HA eval by Dr. 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    Pyelonephritis 03/12/2015   Sensation of pain in anesthetized distribution of trigeminal nerve    Syncope 02/01/2011   In shower on vacation  sustained head laceration  8 sutures Had ed visit neg head ct labs and x ray    Trigeminal neuralgia pain     SURGICAL HISTORY: Past Surgical History:  Procedure Laterality Date   ABDOMINAL HYSTERECTOMY  2002   tubal   BACK SURGERY     2 times, for sciatic nerve pain   BALLOON DILATION  06/21/2021   Procedure: DILATION BALLOON used for esophageal stricture;  Surgeon: Irving Copas., MD;  Location: [facility];  Service: Gastroenterology;;   CARDIAC CATHETERIZATION  2000   chest pains neg   CHOLECYSTECTOMY N/A 02/21/2017   Procedure: LAPAROSCOPIC CHOLECYSTECTOMY WITH INTRAOPERATIVE CHOLANGIOGRAM;  Surgeon: Armandina Gemma, MD;  Location: [facility];  Service: General;  Laterality: N/A;   COLONOSCOPY     multiple   CRANIOTOMY  12/09/2011   nerve decompression right trigeminal    DIRECT LARYNGOSCOPY N/A 02/12/2021   Procedure: DIRECT LARYNGOSCOPY AND BIOPSY POSSIBLE FROZEN;  Surgeon: [name], MD;  Location: [facility];  Service: ENT;  Laterality: N/A;   DOPPLER ECHOCARDIOGRAPHY  2009   nl lv function mild lv dilitation   ESOPHAGOGASTRODUODENOSCOPY (EGD) WITH PROPOFOL N/A 06/21/2021   Procedure: ESOPHAGOGASTRODUODENOSCOPY (EGD) WITH PROPOFOL;  Surgeon: Irving Copas., MD;  Location: [facility];  Service: Gastroenterology;  Laterality: N/A;  Request Fluoroscopy; Plan in for dilation   ESOPHAGOGASTRODUODENOSCOPY (EGD) WITH PROPOFOL N/A 07/19/2021   Procedure: ESOPHAGOGASTRODUODENOSCOPY  (EGD) WITH PROPOFOL;  Surgeon: Rush Landmark Telford Nab., MD;  Location: [facility];  Service: Endoscopy;  Laterality: N/A;  Dilation fluoro   ESOPHAGOGASTRODUODENOSCOPY (EGD) WITH PROPOFOL N/A 08/05/2021   Procedure: ESOPHAGOGASTRODUODENOSCOPY (EGD) WITH PROPOFOL - fluoro;  Surgeon: Rush Landmark Telford Nab., MD;  Location: [facility];  Service: Gastroenterology;  Laterality: N/A;   ESOPHAGOGASTRODUODENOSCOPY (EGD) WITH PROPOFOL N/A 08/23/2021   Procedure: ESOPHAGOGASTRODUODENOSCOPY (EGD) WITH PROPOFOL;  Surgeon: [name], MD;  Location: [facility];  Service: Gastroenterology;  Laterality: N/A;  Using Fluoroscopy and esophageal dilation   ESOPHAGOGASTRODUODENOSCOPY (EGD) WITH PROPOFOL N/A 09/22/2021   Procedure: ESOPHAGOGASTRODUODENOSCOPY (EGD) WITH PROPOFOL;  Surgeon: [name], MD;  [facility];  Service: Gastroenterology;  Laterality: N/A;  EGD with savary and flour   ESOPHAGOGASTRODUODENOSCOPY (EGD) WITH PROPOFOL N/A 12/07/2021   Procedure: ESOPHAGOGASTRODUODENOSCOPY (EGD) WITH PROPOFOL;  Surgeon: [name], MD;  [facility];  Service: Gastroenterology;  Laterality: N/A;  Schedule this with Fluroscopy, it is a savary dilation   ESOPHAGOGASTRODUODENOSCOPY (EGD) WITH PROPOFOL N/A 04/05/2022   Procedure: ESOPHAGOGASTRODUODENOSCOPY (EGD) WITH PROPOFOL;  Surgeon: [name], MD;  [facility];  Service: Gastroenterology;  Laterality: N/A;  EGD /savary dilation with fluoro   EYE SURGERY Bilateral    ioc for catatracts   GASTROSTOMY N/A 02/26/2021   Procedure: OPEN GASTROSTOMY TUBE PLACEMENT;  Surgeon: Armandina Gemma, MD;  Location: [facility];  Service: General;  Laterality: N/A;   IR GASTROSTOMY TUBE MOD SED  02/22/2021   IR GASTROSTOMY TUBE REMOVAL  02/10/2022   IR REPLACE G-TUBE SIMPLE WO FLUORO  09/07/2021   laparoscopic gallbladder surgery  02/16/2017   Fax from [facility] Bilateral 2002   rt shoulder surgery     SAVORY  DILATION N/A 06/21/2021   Procedure: SAVORY DILATION;  Surgeon: Irving Copas., MD;  Location: [facility];  Service: Gastroenterology;  Laterality: N/A;   SAVORY DILATION N/A 07/19/2021   Procedure: SAVORY DILATION;  Surgeon: Rush Landmark Telford Nab., MD;  Location: [facility];  Service: Endoscopy;  Laterality: N/A;   SAVORY DILATION N/A 08/05/2021   Procedure: SAVORY DILATION;  Surgeon: Rush Landmark Telford Nab., MD;  Location: [facility];  Service: Gastroenterology;  Laterality: N/A;   SAVORY DILATION N/A 08/23/2021   Procedure: SAVORY DILATION;  Surgeon: [name], MD;  Location: [facility];  Service: Gastroenterology;  Laterality: N/A;   SAVORY DILATION N/A 09/22/2021   Procedure: SAVORY DILATION;  Surgeon: [name], MD;  Location: [facility];  Service: Gastroenterology;  Laterality: N/A;   SAVORY DILATION N/A 12/07/2021   Procedure: SAVORY DILATION;  Surgeon: [name], MD;  Location: [facility];  Service: Gastroenterology;  Laterality: N/A;   SAVORY DILATION N/A 04/05/2022   Procedure: SAVORY DILATION;  Surgeon: [name], MD;  Location: [facility];  Service: Gastroenterology;  Laterality: N/A;    I have reviewed the social history and family history with the patient and they are unchanged from previous note.  ALLERGIES:  is allergic to bactrim [sulfamethoxazole-trimethoprim], hydrocodone, and sulfa antibiotics.  MEDICATIONS:  Current Outpatient Medications  Medication Sig Dispense Refill   acetaminophen (TYLENOL) 500 MG tablet Take 500-1,000 mg by mouth every 4 (four) hours as needed for headache, mild pain or moderate pain.     Calcium Carb-Cholecalciferol 600-20 MG-MCG TABS Take by mouth.     carvedilol (COREG) 25 MG tablet TAKE 1 TABLET(25 MG) BY MOUTH TWICE DAILY WITH A MEAL 60 tablet 2   carvedilol (COREG) 25 MG tablet TAKE 1 TABLET(25 MG) BY MOUTH TWICE DAILY WITH A MEAL 60 tablet 2   conjugated estrogens (PREMARIN) vaginal cream Place 1  applicator vaginally once a week.     doxycycline (VIBRAMYCIN) 100 MG capsule Take 1 capsule (100 mg total) by mouth 2 (two) times daily. (Patient not taking: Reported on 05/05/2022) 14 capsule 0   Ferrous Sulfate (IRON PO) Take 1 tablet by mouth daily.     fluticasone (FLONASE) 50 MCG/ACT nasal spray Place 2 sprays into both nostrils as needed for allergies or rhinitis. (Patient taking differently: Place 2 sprays into both nostrils daily as needed for allergies or rhinitis.) 16 g 5   furosemide (LASIX) 20 MG tablet TAKE 1 TABLET(20 MG) BY MOUTH DAILY 90 tablet 0   guaiFENesin-codeine 100-10 MG/5ML syrup Place 5 mLs into feeding tube 2 (two) times daily as needed for cough. 236 mL 0   lamoTRIgine (  LAMICTAL) 100 MG tablet PLACE 2.5 TABLETS INTO THE FEEDING TUBE TWICE DAILY 150 tablet 1   loperamide (IMODIUM) 2 MG capsule 2 mg daily as needed for diarrhea or loose stools. Per tube     Multiple Minerals-Vitamins (CAL-MAG-ZINC-D PO) Take 1 tablet by mouth 2 (two) times daily.     Multiple Vitamin (MULTIVITAMIN WITH MINERALS) TABS tablet Place 1 tablet into feeding tube in the morning.     mupirocin ointment (BACTROBAN) 2 %      neomycin-polymyxin-hydrocortisone (CORTISPORIN) 3.5-10000-1 OTIC suspension Apply 1-2 drops daily after soaking and cover with bandaid 10 mL 0   Nutritional Supplements (NUTREN 1.5) LIQD Place 237 mLs into feeding tube in the morning and at bedtime.     ondansetron (ZOFRAN-ODT) 4 MG disintegrating tablet DISSOLVE 1 TO 2 TABLETS(4 TO 8 MG) ON THE TONGUE EVERY 8 HOURS AS NEEDED FOR NAUSEA OR VOMITING 30 tablet 1   Polyethyl Glycol-Propyl Glycol (SYSTANE) 0.4-0.3 % GEL ophthalmic gel Place 1 application into both eyes 2 (two) times daily as needed (dry eyes).     prochlorperazine (COMPAZINE) 10 MG tablet TAKE 1 TABLET(10 MG) BY MOUTH EVERY 6 HOURS AS NEEDED FOR NAUSEA OR VOMITING (Patient taking differently: Take 10 mg by mouth every 6 (six) hours as needed for nausea or vomiting.) 30  tablet 1   No current facility-administered medications for this visit.    PHYSICAL EXAMINATION: ECOG PERFORMANCE STATUS: 2 - Symptomatic, <50% confined to bed  Vitals:   07/13/22 1211  BP: (!) 150/80  Pulse: 66  Resp: 17  Temp: 97.6 F (36.4 C)  SpO2: 98%   Wt Readings from Last 3 Encounters:  07/13/22 134 lb 8 oz (61 kg)  05/05/22 135 lb 8 oz (61.5 kg)  04/26/22 136 lb (61.7 kg)     LUNGS:(-) clear to auscultation and percussion with normal breathing effort HEART: (-) regular rate & rhythm and no murmurs and (-) no lower extremity edema ABDOMEN:(-)abdomen soft, (-) non-tender and normal bowel sounds   LABORATORY DATA:  I have reviewed the data as listed    Latest Ref Rng & Units 07/13/2022   11:37 AM 05/05/2022   12:51 PM 04/07/2022    3:04 PM  CBC  WBC 4.0 - 10.5 K/uL 5.1  4.9  4.6   Hemoglobin 12.0 - 15.0 g/dL 14.9  15.1  13.9   Hematocrit 36.0 - 46.0 % 45.0  45.4  42.6   Platelets 150 - 400 K/uL 160  158  145         Latest Ref Rng & Units 07/13/2022   11:37 AM 05/05/2022   12:51 PM 04/07/2022    3:04 PM  CMP  Glucose 70 - 99 mg/dL 101  92  92   BUN 8 - 23 mg/dL 30  29  29   $ Creatinine 0.44 - 1.00 mg/dL 1.00  0.92  0.94   Sodium 135 - 145 mmol/L 143  141  142   Potassium 3.5 - 5.1 mmol/L 4.5  3.9  4.5   Chloride 98 - 111 mmol/L 102  104  101   CO2 22 - 32 mmol/L 38  34  38   Calcium 8.9 - 10.3 mg/dL 10.5  10.5  10.6   Total Protein 6.5 - 8.1 g/dL 6.6  6.2  6.6   Total Bilirubin 0.3 - 1.2 mg/dL 0.8  0.8  0.7   Alkaline Phos 38 - 126 U/L 73  85  74   AST 15 - 41 U/L  $22  26  22   z$ ALT 0 - 44 U/L 17  18  18       $ RADIOGRAPHIC STUDIES: I have personally reviewed the radiological images as listed and agreed with the findings in the report. No results found.    Orders Placed This Encounter  Procedures   CT Chest Wo Contrast    Standing Status:   Future    Standing Expiration Date:   07/13/2023    Order Specific Question:   Preferred imaging  location?    Answer:   [facility]   All questions were answered. The patient knows to call the clinic with any problems, questions or concerns. No barriers to learning was detected. The total time spent in the appointment was 30 minutes.     [name], MD 07/13/2022   Felicity Coyer, CMA, am acting as scribe for [name], MD.   I have reviewed the above documentation for accuracy and completeness, and I agree with the above.

## 2022-07-13 ENCOUNTER — Encounter: Payer: Self-pay | Admitting: Hematology

## 2022-07-13 ENCOUNTER — Other Ambulatory Visit: Payer: Self-pay

## 2022-07-13 ENCOUNTER — Inpatient Hospital Stay: Payer: Medicare Other

## 2022-07-13 ENCOUNTER — Ambulatory Visit: Payer: Medicare Other | Admitting: Nutrition

## 2022-07-13 ENCOUNTER — Inpatient Hospital Stay: Payer: Medicare Other | Attending: Nurse Practitioner | Admitting: Hematology

## 2022-07-13 VITALS — BP 150/80 | HR 66 | Temp 97.6°F | Resp 17 | Ht 65.25 in | Wt 134.5 lb

## 2022-07-13 DIAGNOSIS — R059 Cough, unspecified: Secondary | ICD-10-CM | POA: Insufficient documentation

## 2022-07-13 DIAGNOSIS — Z90710 Acquired absence of both cervix and uterus: Secondary | ICD-10-CM | POA: Diagnosis not present

## 2022-07-13 DIAGNOSIS — C153 Malignant neoplasm of upper third of esophagus: Secondary | ICD-10-CM

## 2022-07-13 DIAGNOSIS — R0601 Orthopnea: Secondary | ICD-10-CM | POA: Diagnosis not present

## 2022-07-13 DIAGNOSIS — Z8501 Personal history of malignant neoplasm of esophagus: Secondary | ICD-10-CM | POA: Diagnosis present

## 2022-07-13 DIAGNOSIS — I10 Essential (primary) hypertension: Secondary | ICD-10-CM | POA: Diagnosis not present

## 2022-07-13 LAB — CMP (CANCER CENTER ONLY)
ALT: 17 U/L (ref 0–44)
AST: 22 U/L (ref 15–41)
Albumin: 4 g/dL (ref 3.5–5.0)
Alkaline Phosphatase: 73 U/L (ref 38–126)
Anion gap: 3 — ABNORMAL LOW (ref 5–15)
BUN: 30 mg/dL — ABNORMAL HIGH (ref 8–23)
CO2: 38 mmol/L — ABNORMAL HIGH (ref 22–32)
Calcium: 10.5 mg/dL — ABNORMAL HIGH (ref 8.9–10.3)
Chloride: 102 mmol/L (ref 98–111)
Creatinine: 1 mg/dL (ref 0.44–1.00)
GFR, Estimated: 56 mL/min — ABNORMAL LOW (ref >60)
Glucose, Bld: 101 mg/dL — ABNORMAL HIGH (ref 70–99)
Potassium: 4.5 mmol/L (ref 3.5–5.1)
Sodium: 143 mmol/L (ref 135–145)
Total Bilirubin: 0.8 mg/dL (ref 0.3–1.2)
Total Protein: 6.6 g/dL (ref 6.5–8.1)

## 2022-07-13 LAB — CBC WITH DIFFERENTIAL (CANCER CENTER ONLY)
Abs Immature Granulocytes: 0.01 10*3/uL (ref 0.00–0.07)
Basophils Absolute: 0.1 10*3/uL (ref 0.0–0.1)
Basophils Relative: 1 %
Eosinophils Absolute: 0.2 10*3/uL (ref 0.0–0.5)
Eosinophils Relative: 5 %
HCT: 45 % (ref 36.0–46.0)
Hemoglobin: 14.9 g/dL (ref 12.0–15.0)
Immature Granulocytes: 0 %
Lymphocytes Relative: 16 %
Lymphs Abs: 0.8 10*3/uL (ref 0.7–4.0)
MCH: 30.5 pg (ref 26.0–34.0)
MCHC: 33.1 g/dL (ref 30.0–36.0)
MCV: 92.2 fL (ref 80.0–100.0)
Monocytes Absolute: 0.4 10*3/uL (ref 0.1–1.0)
Monocytes Relative: 7 %
Neutro Abs: 3.6 10*3/uL (ref 1.7–7.7)
Neutrophils Relative %: 71 %
Platelet Count: 160 10*3/uL (ref 150–400)
RBC: 4.88 MIL/uL (ref 3.87–5.11)
RDW: 13.6 % (ref 11.5–15.5)
WBC Count: 5.1 10*3/uL (ref 4.0–10.5)
nRBC: 0 % (ref 0.0–0.2)

## 2022-07-13 NOTE — Progress Notes (Signed)
Faxed refill prescription for Nutren 1.5, unflavored 280m (24/cs) Dispense 72 each with 3 refills to [facility]([phone] per verbal order from Dr. FBurr Medico  Fax confirmation received.

## 2022-07-13 NOTE — Progress Notes (Signed)
Received a message that patient wanted RD to contact her by telephone. Attempted a call on home phone and cell phone with no response. I left a message with my name and phone number for return call.

## 2022-07-13 NOTE — Assessment & Plan Note (Signed)
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 EGD 9/15 and CT neck 02/05/21 showed 6 cm mass in the cervical/upper esophagus with possible local adenopathy. 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  -due to the location in the cervical esophagus, Dr. Kipp Brood feels she is not a surgical candidate.  -she received concurrent chemoRT with carbo/taxol 03/08/21 - 04/14/21. She required dose reduction with last cycle due to radiation dermatitis, hoarseness, dysphagia, and odynophagia but was able to complete treatment as scheduled.  -given PDL1+, she began immunotherapy with Nivolumab on 05/21/21, and completed 1 year therapy in December 2023. -last EGD in November 2023 showed benign stenosis, no evidence of recurrence or residual disease. -restaging CT CAP in November 2023 showed no evidence of disease. -She is clinically doing well, lab reviewed, exam was unremarkable, no clinical concern for recurrence. -Will continue surveillance

## 2022-07-16 ENCOUNTER — Other Ambulatory Visit: Payer: Self-pay | Admitting: Internal Medicine

## 2022-07-17 ENCOUNTER — Other Ambulatory Visit: Payer: Self-pay | Admitting: Internal Medicine

## 2022-07-18 ENCOUNTER — Encounter: Payer: Self-pay | Admitting: Adult Health

## 2022-07-18 ENCOUNTER — Telehealth: Payer: Self-pay | Admitting: Adult Health

## 2022-07-18 ENCOUNTER — Ambulatory Visit (INDEPENDENT_AMBULATORY_CARE_PROVIDER_SITE_OTHER): Payer: Medicare Other | Admitting: Adult Health

## 2022-07-18 VITALS — BP 163/83 | HR 72 | Ht 66.0 in | Wt 132.4 lb

## 2022-07-18 DIAGNOSIS — R202 Paresthesia of skin: Secondary | ICD-10-CM | POA: Diagnosis not present

## 2022-07-18 DIAGNOSIS — R9089 Other abnormal findings on diagnostic imaging of central nervous system: Secondary | ICD-10-CM

## 2022-07-18 DIAGNOSIS — G500 Trigeminal neuralgia: Secondary | ICD-10-CM

## 2022-07-18 NOTE — Progress Notes (Signed)
PATIENT: [name] DOB: 04/04/1941  REASON FOR VISIT: follow up HISTORY FROM: patient PRIMARY NEUROLOGIST:   Chief Complaint  Patient presents with   Follow-up    Pt in 20 Pt here for Trigeminal Neuralgia  Pt states Trigeminal pain is better Pt wants to discuss Gamma knife post affects     HISTORY OF PRESENT ILLNESS: Today 07/18/22  [name] is a 82 y.o. female who has been followed in this office for trigeminal neuralgia. Returns today for follow-up.  She reports that the gamma knife procedure has greatly improved her discomfort. Only had 1 episode of pain maybe 3-4 weeks ago.  Patient reports that she has has constant pins and needles  on the right side of the face. Its there most of the time. Reports that she can live with it if she has to but does find it quite annoying. Spoke PA in  [place] and she advised the patient that this could be symptoms post gamma knife procedure.    Patient had MRI of the brain back in November with some abnormalities.  Dr. Jaynee Eagles requested to repeat in 3 months.  MRI brain with and without contrast March 30, 2022: IMPRESSION: 1. Postsurgical changes from prior right trigeminal nerve decompression with Teflon pledget seen superior and lateral to the right trigeminal nerve. 2. Prominent enhancement of the cisternal segment of the right trigeminal nerve, new from prior MRI, likely reflecting neuritis. 3. Mild chronic microvascular ischemic changes of the white matter.    REVIEW OF SYSTEMS: Out of a complete 14 system review of symptoms, the patient complains only of the following symptoms, and all other reviewed systems are negative.  ALLERGIES: Allergies  Allergen Reactions   Bactrim [Sulfamethoxazole-Trimethoprim] Hives and Itching   Hydrocodone Other (See Comments)    Rebound headaches   Sulfa Antibiotics Other (See Comments)    HOME MEDICATIONS: Outpatient Medications Prior to Visit  Medication Sig  Dispense Refill   acetaminophen (TYLENOL) 500 MG tablet Take 500-1,000 mg by mouth every 4 (four) hours as needed for headache, mild pain or moderate pain.     Calcium Carb-Cholecalciferol 600-20 MG-MCG TABS Take by mouth.     carvedilol (COREG) 25 MG tablet TAKE 1 TABLET(25 MG) BY MOUTH TWICE DAILY WITH A MEAL 60 tablet 2   carvedilol (COREG) 25 MG tablet TAKE 1 TABLET(25 MG) BY MOUTH TWICE DAILY WITH A MEAL 60 tablet 2   conjugated estrogens (PREMARIN) vaginal cream Place 1 applicator vaginally once a week.     doxycycline (VIBRAMYCIN) 100 MG capsule Take 1 capsule (100 mg total) by mouth 2 (two) times daily. 14 capsule 0   Ferrous Sulfate (IRON PO) Take 1 tablet by mouth daily.     fluticasone (FLONASE) 50 MCG/ACT nasal spray Place 2 sprays into both nostrils as needed for allergies or rhinitis. (Patient taking differently: Place 2 sprays into both nostrils daily as needed for allergies or rhinitis.) 16 g 5   furosemide (LASIX) 20 MG tablet TAKE 1 TABLET(20 MG) BY MOUTH DAILY 90 tablet 0   guaiFENesin-codeine 100-10 MG/5ML syrup Place 5 mLs into feeding tube 2 (two) times daily as needed for cough. 236 mL 0   lamoTRIgine (LAMICTAL) 100 MG tablet PLACE 2.5 TABLETS INTO THE FEEDING TUBE TWICE DAILY 150 tablet 1   loperamide (IMODIUM) 2 MG capsule 2 mg daily as needed for diarrhea or loose stools. Per tube     Multiple Minerals-Vitamins (CAL-MAG-ZINC-D PO) Take 1 tablet by mouth 2 (  two) times daily.     Multiple Vitamin (MULTIVITAMIN WITH MINERALS) TABS tablet Place 1 tablet into feeding tube in the morning.     mupirocin ointment (BACTROBAN) 2 %      neomycin-polymyxin-hydrocortisone (CORTISPORIN) 3.5-10000-1 OTIC suspension Apply 1-2 drops daily after soaking and cover with bandaid 10 mL 0   Nutritional Supplements (NUTREN 1.5) LIQD Place 237 mLs into feeding tube in the morning and at bedtime.     omeprazole (PRILOSEC) 20 MG capsule TAKE 1 CAPSULE(20 MG) BY MOUTH DAILY 30 capsule 2    ondansetron (ZOFRAN-ODT) 4 MG disintegrating tablet DISSOLVE 1 TO 2 TABLETS(4 TO 8 MG) ON THE TONGUE EVERY 8 HOURS AS NEEDED FOR NAUSEA OR VOMITING 30 tablet 1   Polyethyl Glycol-Propyl Glycol (SYSTANE) 0.4-0.3 % GEL ophthalmic gel Place 1 application into both eyes 2 (two) times daily as needed (dry eyes).     prochlorperazine (COMPAZINE) 10 MG tablet TAKE 1 TABLET(10 MG) BY MOUTH EVERY 6 HOURS AS NEEDED FOR NAUSEA OR VOMITING (Patient taking differently: Take 10 mg by mouth every 6 (six) hours as needed for nausea or vomiting.) 30 tablet 1   No facility-administered medications prior to visit.    PAST MEDICAL HISTORY: Past Medical History:  Diagnosis Date   Abdominal pain 05/29/2013   s/p rx of cephalo resistant e coli   but last rx NG  now residular ?  bladder sx repeat cx sx rx to ty and uro consult    ADJ DISORDER WITH MIXED ANXIETY & DEPRESSED MOOD 03/03/2010   Qualifier: Diagnosis of  By: [name], [name]    Agent resistant to multiple antibiotics 05/29/2013   e coli   bu NG on fu.     Anemia    Anxiety    ARF (acute renal failure) (HCC) 03/12/2015   Closed head injury 02/01/2011   from syncope and had scalp laceration  neg ct .     Closed head injury 5-6 yrs ago   Colitis [id number]   Complication of anesthesia    migraine several hours after general anesthesia   Depression    esophageal ca 01/2021   Fatty liver    Gall stones 2016   see ct scan neg [facility]    GERD (gastroesophageal reflux disease)    Hearing aid worn    HOH (hard of hearing)    both ears   Hyperlipidemia    Hypertension    echo nl lv function  mild dilitation 2009   Kidney infection    few yrs ago in hospital   Medication side effect 09/02/2010   Poss muscle se of 10 crestor    Migraine    hypnic HA eval by Dr. 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    Pyelonephritis 03/12/2015   Sensation of pain in anesthetized distribution of trigeminal nerve    Syncope 02/01/2011    In shower on vacation  sustained head laceration  8 sutures Had ed visit neg head ct labs and x ray    Trigeminal neuralgia pain     PAST SURGICAL HISTORY: Past Surgical History:  Procedure Laterality Date   ABDOMINAL HYSTERECTOMY  2002   tubal   BACK SURGERY     2 times, for sciatic nerve pain   BALLOON DILATION  06/21/2021   Procedure: DILATION BALLOON used for esophageal stricture;  Surgeon: Irving Copas., MD;  Location: [facility];  Service: Gastroenterology;;   CARDIAC  CATHETERIZATION  2000   chest pains neg   CHOLECYSTECTOMY N/A 02/21/2017   Procedure: LAPAROSCOPIC CHOLECYSTECTOMY WITH INTRAOPERATIVE CHOLANGIOGRAM;  Surgeon: Armandina Gemma, MD;  Location: [facility];  Service: General;  Laterality: N/A;   COLONOSCOPY     multiple   CRANIOTOMY  12/09/2011   nerve decompression right trigeminal    DIRECT LARYNGOSCOPY N/A 02/12/2021   Procedure: DIRECT LARYNGOSCOPY AND BIOPSY POSSIBLE FROZEN;  Surgeon: [name], MD;  Location: [facility];  Service: ENT;  Laterality: N/A;   DOPPLER ECHOCARDIOGRAPHY  2009   nl lv function mild lv dilitation   ESOPHAGOGASTRODUODENOSCOPY (EGD) WITH PROPOFOL N/A 06/21/2021   Procedure: ESOPHAGOGASTRODUODENOSCOPY (EGD) WITH PROPOFOL;  Surgeon: Irving Copas., MD;  Location: [facility];  Service: Gastroenterology;  Laterality: N/A;  Request Fluoroscopy; Plan in for dilation   ESOPHAGOGASTRODUODENOSCOPY (EGD) WITH PROPOFOL N/A 07/19/2021   Procedure: ESOPHAGOGASTRODUODENOSCOPY (EGD) WITH PROPOFOL;  Surgeon: Rush Landmark Telford Nab., MD;  Location: [facility];  Service: Endoscopy;  Laterality: N/A;  Dilation fluoro   ESOPHAGOGASTRODUODENOSCOPY (EGD) WITH PROPOFOL N/A 08/05/2021   Procedure: ESOPHAGOGASTRODUODENOSCOPY (EGD) WITH PROPOFOL - fluoro;  Surgeon: Rush Landmark Telford Nab., MD;  Location: [facility];  Service: Gastroenterology;  Laterality: N/A;   ESOPHAGOGASTRODUODENOSCOPY (EGD) WITH PROPOFOL N/A 08/23/2021    Procedure: ESOPHAGOGASTRODUODENOSCOPY (EGD) WITH PROPOFOL;  Surgeon: [name], MD;  Location: [facility];  Service: Gastroenterology;  Laterality: N/A;  Using Fluoroscopy and esophageal dilation   ESOPHAGOGASTRODUODENOSCOPY (EGD) WITH PROPOFOL N/A 09/22/2021   Procedure: ESOPHAGOGASTRODUODENOSCOPY (EGD) WITH PROPOFOL;  Surgeon: [name], MD;  [facility];  Service: Gastroenterology;  Laterality: N/A;  EGD with savary and flour   ESOPHAGOGASTRODUODENOSCOPY (EGD) WITH PROPOFOL N/A 12/07/2021   Procedure: ESOPHAGOGASTRODUODENOSCOPY (EGD) WITH PROPOFOL;  Surgeon: [name], MD;  [facility]: [facility];  Service: Gastroenterology;  Laterality: N/A;  Schedule this with Fluroscopy, it is a savary dilation   ESOPHAGOGASTRODUODENOSCOPY (EGD) WITH PROPOFOL N/A 04/05/2022   Procedure: ESOPHAGOGASTRODUODENOSCOPY (EGD) WITH PROPOFOL;  Surgeon: [name], MD;  [facility]: [facility];  Service: Gastroenterology;  Laterality: N/A;  EGD /savary dilation with fluoro   EYE SURGERY Bilateral    ioc for catatracts   GASTROSTOMY N/A 02/26/2021   Procedure: OPEN GASTROSTOMY TUBE PLACEMENT;  Surgeon: Armandina Gemma, MD;  Location: [facility];  Service: General;  Laterality: N/A;   IR GASTROSTOMY TUBE MOD SED  02/22/2021   IR GASTROSTOMY TUBE REMOVAL  02/10/2022   IR REPLACE G-TUBE SIMPLE WO FLUORO  09/07/2021   laparoscopic gallbladder surgery  02/16/2017   Fax from [place] Bilateral 2002   rt shoulder surgery     SAVORY DILATION N/A 06/21/2021   Procedure: SAVORY DILATION;  Surgeon: Irving Copas., MD;  Location: [facility];  Service: Gastroenterology;  Laterality: N/A;   SAVORY DILATION N/A 07/19/2021   Procedure: SAVORY DILATION;  Surgeon: Rush Landmark Telford Nab., MD;  Location: [facility];  Service: Endoscopy;  Laterality: N/A;   SAVORY DILATION N/A 08/05/2021   Procedure: SAVORY DILATION;  Surgeon: Rush Landmark Telford Nab., MD;  Location: [facility];  Service: Gastroenterology;  Laterality: N/A;   SAVORY DILATION N/A 08/23/2021   Procedure: SAVORY DILATION;  Surgeon: [name], MD;  Location: [facility];  Service: Gastroenterology;  Laterality: N/A;   SAVORY DILATION N/A 09/22/2021   Procedure: SAVORY DILATION;  Surgeon: [name], MD;  Location: [facility];  Service: Gastroenterology;  Laterality: N/A;   SAVORY DILATION N/A 12/07/2021   Procedure: SAVORY DILATION;  Surgeon: [name], MD;  Location: [facility];  Service: Gastroenterology;  Laterality: N/A;   SAVORY DILATION N/A 04/05/2022   Procedure: SAVORY DILATION;  Surgeon: [name], MD;  Location: [facility];  Service: Gastroenterology;  Laterality: N/A;    FAMILY HISTORY: Family History  Problem Relation Age of Onset   Cancer Mother        uterine   Ovarian cancer Mother    Stroke Mother    Alcohol abuse Father    Stroke Father    Diabetes Brother    Cancer Paternal Aunt        leukemia, unknown type   Seizures Daughter    Hypertension Other    Colon cancer Neg Hx    Dementia Neg Hx    Alzheimer's disease Neg Hx     SOCIAL HISTORY: Social History   Socioeconomic History   Marital status: Married    Spouse name: Not on file   Number of children: 2   Years of education: Not on file   Highest education level: Not on file  Occupational History    Comment: retired Forensic psychologist  Tobacco Use   Smoking status: Never   Smokeless tobacco: Never  Vaping Use   Vaping Use: Never used  Substance and Sexual Activity   Alcohol use: Not Currently    Comment: occ wine   Drug use: No   Sexual activity: Not on file  Other Topics Concern   Not on file  Social History Narrative   Married   HH of 2-3 (god daughter)   Pets 2 dogs   Non smoker    Child is a Engineer, drilling   G2P2      Caffeine: 2 cups/day   Social Determinants of Health   Financial Resource Strain: Low Risk  (04/12/2022)   Overall Financial Resource Strain  (CARDIA)    Difficulty of Paying Living Expenses: Not hard at all  Food Insecurity: No Food Insecurity (04/12/2022)   Hunger Vital Sign    Worried About Running Out of Food in the Last Year: Never true    Ran Out of Food in the Last Year: Never true  Transportation Needs: No Transportation Needs (04/12/2022)   PRAPARE - Hydrologist (Medical): No    Lack of Transportation (Non-Medical): No  Physical Activity: Insufficiently Active (04/12/2022)   Exercise Vital Sign    Days of Exercise per Week: 3 days    Minutes of Exercise per Session: 30 min  Stress: No Stress Concern Present (04/12/2022)   [place]    Feeling of Stress : Not at all  Social Connections: Moderately Isolated (04/12/2022)   Social Connection and Isolation Panel [NHANES]    Frequency of Communication with Friends and Family: More than three times a week    Frequency of Social Gatherings with Friends and Family: More than three times a week    Attends Religious Services: Never    Marine scientist or Organizations: No    Attends Archivist Meetings: Never    Marital Status: Married  Human resources officer Violence: Not At Risk (04/12/2022)   Humiliation, Afraid, Rape, and Kick questionnaire    Fear of Current or Ex-Partner: No    Emotionally Abused: No    Physically Abused: No    Sexually Abused: No      PHYSICAL EXAM  Vitals:   07/18/22 1114 07/18/22 1122  BP: (!) 155/89 Marland Kitchen)  163/83  Pulse: 72 72  Weight: 132 lb 6.4 oz (60.1 kg)   Height: '5\' 6"'$  (1.676 m)    Body mass index is 21.37 kg/m.  Generalized: Well developed, in no acute distress   Neurological examination  Mentation: Alert oriented to time, place, history taking. Follows all commands speech and language fluent Cranial nerve II-XII: Pupils were equal round reactive to light. Extraocular movements were full, visual field were full on  confrontational test. Facial sensation and strength were normal. Uvula tongue midline. Head turning and shoulder shrug  were normal and symmetric. Motor: The motor testing reveals 5 over 5 strength of all 4 extremities. Good symmetric motor tone is noted throughout.  Sensory: Sensory testing is intact to soft touch on all 4 extremities. No evidence of extinction is noted.  Coordination: Cerebellar testing reveals good finger-nose-finger and heel-to-shin bilaterally.  Gait and station: Gait is normal.    DIAGNOSTIC DATA (LABS, IMAGING, TESTING) - I reviewed patient records, labs, notes, testing and imaging myself where available.  Lab Results  Component Value Date   WBC 5.1 07/13/2022   HGB 14.9 07/13/2022   HCT 45.0 07/13/2022   MCV 92.2 07/13/2022   PLT 160 07/13/2022      Component Value Date/Time   NA 143 07/13/2022 1137   NA 136 03/24/2021 1508   K 4.5 07/13/2022 1137   CL 102 07/13/2022 1137   CO2 38 (H) 07/13/2022 1137   GLUCOSE 101 (H) 07/13/2022 1137   GLUCOSE 97 03/17/2006 0820   BUN 30 (H) 07/13/2022 1137   BUN 26 03/24/2021 1508   CREATININE 1.00 07/13/2022 1137   CALCIUM 10.5 (H) 07/13/2022 1137   PROT 6.6 07/13/2022 1137   PROT 6.2 03/24/2021 1508   ALBUMIN 4.0 07/13/2022 1137   ALBUMIN 4.2 03/24/2021 1508   AST 22 07/13/2022 1137   ALT 17 07/13/2022 1137   ALKPHOS 73 07/13/2022 1137   BILITOT 0.8 07/13/2022 1137   GFRNONAA 56 (L) 07/13/2022 1137   GFRAA 62 07/08/2020 1653   Lab Results  Component Value Date   CHOL 279 (H) 05/27/2019   HDL 118.80 05/27/2019   LDLCALC 145 (H) 05/27/2019   LDLDIRECT 135.9 10/16/2012   TRIG 78.0 05/27/2019   CHOLHDL 2 05/27/2019   Lab Results  Component Value Date   HGBA1C 5.2 11/09/2021   Lab Results  Component Value Date   VITAMINB12 933 (H) 07/01/2020   Lab Results  Component Value Date   TSH 2.302 04/07/2022      ASSESSMENT AND PLAN 82 y.o. year old female  has a past medical history of Abdominal pain  (05/29/2013), ADJ DISORDER WITH MIXED ANXIETY & DEPRESSED MOOD (03/03/2010), Agent resistant to multiple antibiotics (05/29/2013), Anemia, Anxiety, ARF (acute renal failure) ([place]) (03/12/2015), Closed head injury (02/01/2011), Closed head injury (5-6 yrs ago), Colitis (Q000111Q), Complication of anesthesia, Depression, esophageal ca (01/2021), Fatty liver, Gall stones (2016), GERD (gastroesophageal reflux disease), Hearing aid worn, HOH (hard of hearing), Hyperlipidemia, Hypertension, Kidney infection, Medication side effect (09/02/2010), Migraine, Polycythemia, Positive PPD, Pyelonephritis (03/12/2015), Sensation of pain in anesthetized distribution of trigeminal nerve, Syncope (02/01/2011), and Trigeminal neuralgia pain. here with:  1.  Trigeminal neuralgia 2.  Paresthesias in the right side of the face  We discussed potentially trying different medication other than Lamictal, recommended gabapentin however the patient reports that that was not helpful in the past.  She does not particularly want to try any new medication.  She does report that if there is any other procedures  that would help with her symptoms she would consider that.  For now we will go ahead and repeat MRI of the brain with and without contrast pending those results we will decide on the next step.      [name], MSN, NP-C 07/18/2022, 11:44 AM [facility] [address] [place], [place] [address] [phone] \

## 2022-07-18 NOTE — Telephone Encounter (Signed)
[employer] medicare NPR sent to [facility]

## 2022-07-20 ENCOUNTER — Ambulatory Visit (HOSPITAL_BASED_OUTPATIENT_CLINIC_OR_DEPARTMENT_OTHER): Payer: Medicare Other | Admitting: Cardiology

## 2022-07-20 ENCOUNTER — Other Ambulatory Visit: Payer: Self-pay

## 2022-07-20 NOTE — Progress Notes (Signed)
Dr. Ernestina Penna last office note faxed to [facility] [phone] per their request.  Fax confirmation received.

## 2022-07-26 ENCOUNTER — Ambulatory Visit (INDEPENDENT_AMBULATORY_CARE_PROVIDER_SITE_OTHER): Payer: Medicare Other | Admitting: Cardiology

## 2022-07-26 ENCOUNTER — Telehealth: Payer: Self-pay | Admitting: Internal Medicine

## 2022-07-26 ENCOUNTER — Encounter (HOSPITAL_BASED_OUTPATIENT_CLINIC_OR_DEPARTMENT_OTHER): Payer: Self-pay | Admitting: Cardiology

## 2022-07-26 ENCOUNTER — Telehealth: Payer: Self-pay

## 2022-07-26 ENCOUNTER — Other Ambulatory Visit: Payer: Self-pay | Admitting: Hematology

## 2022-07-26 VITALS — BP 154/98 | HR 66 | Ht 66.0 in | Wt 132.9 lb

## 2022-07-26 DIAGNOSIS — R600 Localized edema: Secondary | ICD-10-CM | POA: Diagnosis not present

## 2022-07-26 DIAGNOSIS — I10 Essential (primary) hypertension: Secondary | ICD-10-CM | POA: Diagnosis not present

## 2022-07-26 DIAGNOSIS — Z7189 Other specified counseling: Secondary | ICD-10-CM

## 2022-07-26 DIAGNOSIS — E785 Hyperlipidemia, unspecified: Secondary | ICD-10-CM | POA: Diagnosis not present

## 2022-07-26 NOTE — Patient Instructions (Addendum)
Let's go back to checking home blood pressure at least a few times per week. If it is consistently >140/90, call and let me know.  how to check blood pressure:  -sit comfortably in a chair, feet uncrossed and flat on floor, for 5-10 minutes  -arm ideally should rest at the level of the heart. However, arm should be relaxed and not tense (for example, do not hold the arm up unsupported)  -avoid exercise, caffeine, and tobacco for at least 30 minutes prior to BP reading  -don't take BP cuff reading over clothes (always place on skin directly)  -I prefer to know how well the medication is working, so I would like you to take your readings 1-2 hours after taking your blood pressure medication if possible   Your physician wants you to follow-up in: 1 YEAR  You will receive a reminder letter in the mail two months in advance. If you don't receive a letter, please call our office to schedule the follow-up appointment.

## 2022-07-26 NOTE — Telephone Encounter (Signed)
[name] called in this morning reporting SOB and wanted to know if Dr. [name] wanted to call in a prescription for some Albuterol. I asked the pt when did she start experiencing these symptoms? She stated several weeks ago. Pt stated that t feels like her throat is closing up on her. I asked the pt did she eat anything that she may think she was allergic to, and she said no. The pt state she has SOB when she is mobile and she is wheezing and also is having a lot of coughing with phlegm and she stated the phlegm is a normal color. Pt also stated that she has some albuterol at her home which was not prescribe by Dr. [name] and she took and she said it seems to help. Pt denies having chest pain,nausea,vomiting and no fever. Pt does have a CT scan schedule for 07/27/2022. I advise the pt per Tammi Sou ,RN  to f/u with her PCP and she said she will and she thanked me for help.   Undra Trembath M.,CMA

## 2022-07-26 NOTE — Telephone Encounter (Signed)
Spoke to pt. See other phone message.

## 2022-07-26 NOTE — Telephone Encounter (Signed)
Contacted pt. Pt states her throat is still healing from throat surgery that she did before Christmas. She continues that she has wheezing and it is interfering with her breathing.   Pt request a Rx albuterol inhaler to be send. Advise the message will send to Dr. Regis Bill, for her to okay and/or may need a visit.  Please advise.

## 2022-07-26 NOTE — Progress Notes (Unsigned)
Cardiology Office Note:    Date:  07/27/2022   ID:  [name], DOB Dec 26, 1940, MRN [id number]  PCP:  [name], MD  Cardiologist:  [name], MD  Referring MD: [name], MD   CC: follow up  History of Present Illness:    [name] is a 82 y.o. female with a hx of esophageal cancer, lower extremity edema, hypertension, hyperlipidemia who is seen for follow up today.  I initially met her 12/04/19 as a new consult at the request of [name], [name], MD for the evaluation and management of labile blood pressure.  Today: Blood pressure high in the office. She hasn't been checking at home. Has been dealing with trigeminal neuralgia. Seen recently by Dr. [name] for esophageal cancer, has CT scheduled for tomorrow, recent scans have been NAD. Has developed a cough. CT will look for etiology of this as well. Feels that she has had wheezing.  Minimal phlegm, no fevers/chills. Has felt more short of breath with the cough. No nausea, but low appetite.  LE edema better with compression socks when she wears them.  Denies chest pain. No PND, orthopnea, LE edema or unexpected weight gain. No syncope or palpitations.    Past Medical History:  Diagnosis Date   Abdominal pain 05/29/2013   s/p rx of cephalo resistant e coli   but last rx NG  now residular ?  bladder sx repeat cx sx rx to ty and uro consult    ADJ DISORDER WITH MIXED ANXIETY & DEPRESSED MOOD 03/03/2010   Qualifier: Diagnosis of  By: [name], [name]    Agent resistant to multiple antibiotics 05/29/2013   e coli   bu NG on fu.     Anemia    Anxiety    ARF (acute renal failure) (HCC) 03/12/2015   Closed head injury 02/01/2011   from syncope and had scalp laceration  neg ct .     Closed head injury 5-6 yrs ago   Colitis [id number]   Complication of anesthesia    migraine several hours after general anesthesia   Depression    esophageal ca 01/2021   Fatty liver    Gall stones 2016    see ct scan neg HIDA    GERD (gastroesophageal reflux disease)    Hearing aid worn    HOH (hard of hearing)    both ears   Hyperlipidemia    Hypertension    echo nl lv function  mild dilitation 2009   Kidney infection    few yrs ago in hospital   Medication side effect 09/02/2010   Poss muscle se of 10 crestor    Migraine    hypnic HA eval by Dr. 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    Pyelonephritis 03/12/2015   Sensation of pain in anesthetized distribution of trigeminal nerve    Syncope 02/01/2011   In shower on vacation  sustained head laceration  8 sutures Had ed visit neg head ct labs and x ray    Trigeminal neuralgia pain     Past Surgical History:  Procedure Laterality Date   ABDOMINAL HYSTERECTOMY  2002   tubal   BACK SURGERY     2 times, for sciatic nerve pain   BALLOON DILATION  06/21/2021   Procedure: DILATION BALLOON used for esophageal stricture;  Surgeon: Irving Copas., MD;  Location: [facility];  Service: Gastroenterology;;  CARDIAC CATHETERIZATION  2000   chest pains neg   CHOLECYSTECTOMY N/A 02/21/2017   Procedure: LAPAROSCOPIC CHOLECYSTECTOMY WITH INTRAOPERATIVE CHOLANGIOGRAM;  Surgeon: Armandina Gemma, MD;  Location: [facility];  Service: General;  Laterality: N/A;   COLONOSCOPY     multiple   CRANIOTOMY  12/09/2011   nerve decompression right trigeminal    DIRECT LARYNGOSCOPY N/A 02/12/2021   Procedure: DIRECT LARYNGOSCOPY AND BIOPSY POSSIBLE FROZEN;  Surgeon: [name], MD;  Location: [place];  Service: ENT;  Laterality: N/A;   DOPPLER ECHOCARDIOGRAPHY  2009   nl lv function mild lv dilitation   ESOPHAGOGASTRODUODENOSCOPY (EGD) WITH PROPOFOL N/A 06/21/2021   Procedure: ESOPHAGOGASTRODUODENOSCOPY (EGD) WITH PROPOFOL;  Surgeon: Irving Copas., MD;  Location: [facility];  Service: Gastroenterology;  Laterality: N/A;  Request Fluoroscopy; Plan in for dilation    ESOPHAGOGASTRODUODENOSCOPY (EGD) WITH PROPOFOL N/A 07/19/2021   Procedure: ESOPHAGOGASTRODUODENOSCOPY (EGD) WITH PROPOFOL;  Surgeon: Rush Landmark Telford Nab., MD;  Location: [facility];  Service: Endoscopy;  Laterality: N/A;  Dilation fluoro   ESOPHAGOGASTRODUODENOSCOPY (EGD) WITH PROPOFOL N/A 08/05/2021   Procedure: ESOPHAGOGASTRODUODENOSCOPY (EGD) WITH PROPOFOL - fluoro;  Surgeon: Rush Landmark Telford Nab., MD;  Location: [place];  Service: Gastroenterology;  Laterality: N/A;   ESOPHAGOGASTRODUODENOSCOPY (EGD) WITH PROPOFOL N/A 08/23/2021   Procedure: ESOPHAGOGASTRODUODENOSCOPY (EGD) WITH PROPOFOL;  Surgeon: [name], MD;  Location: [facility];  Service: Gastroenterology;  Laterality: N/A;  Using Fluoroscopy and esophageal dilation   ESOPHAGOGASTRODUODENOSCOPY (EGD) WITH PROPOFOL N/A 09/22/2021   Procedure: ESOPHAGOGASTRODUODENOSCOPY (EGD) WITH PROPOFOL;  Surgeon: [name], MD;  Location: [facility];  Service: Gastroenterology;  Laterality: N/A;  EGD with savary and flour   ESOPHAGOGASTRODUODENOSCOPY (EGD) WITH PROPOFOL N/A 12/07/2021   Procedure: ESOPHAGOGASTRODUODENOSCOPY (EGD) WITH PROPOFOL;  Surgeon: [name], MD;  Location: [facility];  Service: Gastroenterology;  Laterality: N/A;  Schedule this with Fluroscopy, it is a savary dilation   ESOPHAGOGASTRODUODENOSCOPY (EGD) WITH PROPOFOL N/A 04/05/2022   Procedure: ESOPHAGOGASTRODUODENOSCOPY (EGD) WITH PROPOFOL;  Surgeon: [name], MD;  Location: [facility];  Service: Gastroenterology;  Laterality: N/A;  EGD /savary dilation with fluoro   EYE SURGERY Bilateral    ioc for catatracts   GASTROSTOMY N/A 02/26/2021   Procedure: OPEN GASTROSTOMY TUBE PLACEMENT;  Surgeon: Armandina Gemma, MD;  Location: [facility];  Service: General;  Laterality: N/A;   IR GASTROSTOMY TUBE MOD SED  02/22/2021   IR GASTROSTOMY TUBE REMOVAL  02/10/2022   IR REPLACE G-TUBE SIMPLE WO FLUORO  09/07/2021   laparoscopic gallbladder surgery  02/16/2017    Fax from [place] Bilateral 2002   rt shoulder surgery     SAVORY DILATION N/A 06/21/2021   Procedure: SAVORY DILATION;  Surgeon: Irving Copas., MD;  Location: [facility];  Service: Gastroenterology;  Laterality: N/A;   SAVORY DILATION N/A 07/19/2021   Procedure: SAVORY DILATION;  Surgeon: Rush Landmark Telford Nab., MD;  Location: [facility];  Service: Endoscopy;  Laterality: N/A;   SAVORY DILATION N/A 08/05/2021   Procedure: SAVORY DILATION;  Surgeon: Rush Landmark Telford Nab., MD;  Location: [place];  Service: Gastroenterology;  Laterality: N/A;   SAVORY DILATION N/A 08/23/2021   Procedure: SAVORY DILATION;  Surgeon: [name], MD;  Location: [facility];  Service: Gastroenterology;  Laterality: N/A;   SAVORY DILATION N/A 09/22/2021   Procedure: SAVORY DILATION;  Surgeon: [name], MD;  Location: [facility];  Service: Gastroenterology;  Laterality: N/A;   SAVORY DILATION N/A 12/07/2021   Procedure: SAVORY DILATION;  Surgeon: [name], MD;  Location: [facility];  Service: Gastroenterology;  Laterality: N/A;   SAVORY DILATION N/A 04/05/2022   Procedure: SAVORY DILATION;  Surgeon: [name], MD;  Location: [facility];  Service: Gastroenterology;  Laterality: N/A;    Current Medications: Current Outpatient Medications on File Prior to Visit  Medication Sig   acetaminophen (TYLENOL) 500 MG tablet Take 500-1,000 mg by mouth every 4 (four) hours as needed for headache, mild pain or moderate pain.   Calcium Carb-Cholecalciferol 600-20 MG-MCG TABS Take by mouth.   carvedilol (COREG) 25 MG tablet TAKE 1 TABLET(25 MG) BY MOUTH TWICE DAILY WITH A MEAL   conjugated estrogens (PREMARIN) vaginal cream Place 1 applicator vaginally once a week.   Ferrous Sulfate (IRON PO) Take 1 tablet by mouth daily.   fluticasone (FLONASE) 50 MCG/ACT nasal spray Place 2 sprays into both nostrils as needed for allergies or rhinitis. (Patient  taking differently: Place 2 sprays into both nostrils daily as needed for allergies or rhinitis.)   furosemide (LASIX) 20 MG tablet TAKE 1 TABLET(20 MG) BY MOUTH DAILY   lamoTRIgine (LAMICTAL) 100 MG tablet PLACE 2.5 TABLETS INTO THE FEEDING TUBE TWICE DAILY   loperamide (IMODIUM) 2 MG capsule 2 mg daily as needed for diarrhea or loose stools. Per tube   Multiple Minerals-Vitamins (CAL-MAG-ZINC-D PO) Take 1 tablet by mouth 2 (two) times daily.   Multiple Vitamin (MULTIVITAMIN WITH MINERALS) TABS tablet Place 1 tablet into feeding tube in the morning.   mupirocin ointment (BACTROBAN) 2 %    neomycin-polymyxin-hydrocortisone (CORTISPORIN) 3.5-10000-1 OTIC suspension Apply 1-2 drops daily after soaking and cover with bandaid   Nutritional Supplements (NUTREN 1.5) LIQD Place 237 mLs into feeding tube in the morning and at bedtime.   omeprazole (PRILOSEC) 20 MG capsule TAKE 1 CAPSULE(20 MG) BY MOUTH DAILY   ondansetron (ZOFRAN-ODT) 4 MG disintegrating tablet DISSOLVE 1 TO 2 TABLETS(4 TO 8 MG) ON THE TONGUE EVERY 8 HOURS AS NEEDED FOR NAUSEA OR VOMITING   Polyethyl Glycol-Propyl Glycol (SYSTANE) 0.4-0.3 % GEL ophthalmic gel Place 1 application into both eyes 2 (two) times daily as needed (dry eyes).   prochlorperazine (COMPAZINE) 10 MG tablet TAKE 1 TABLET(10 MG) BY MOUTH EVERY 6 HOURS AS NEEDED FOR NAUSEA OR VOMITING (Patient taking differently: Take 10 mg by mouth every 6 (six) hours as needed for nausea or vomiting.)   No current facility-administered medications on file prior to visit.     Allergies:   Bactrim [sulfamethoxazole-trimethoprim], Hydrocodone, and Sulfa antibiotics   Social History   Tobacco Use   Smoking status: Never   Smokeless tobacco: Never  Vaping Use   Vaping Use: Never used  Substance Use Topics   Alcohol use: Not Currently    Comment: occ wine   Drug use: No    Family History: family history includes Alcohol abuse in her father; Cancer in her mother and paternal  aunt; Diabetes in her brother; Hypertension in an other family member; Ovarian cancer in her mother; Seizures in her daughter; Stroke in her father and mother. There is no history of Colon cancer, Dementia, or Alzheimer's disease.  ROS:   Please see the history of present illness.  Additional pertinent ROS otherwise unremarkable.  EKGs/Labs/Other Studies Reviewed:    The following studies were reviewed today: Echo 12/18/19  1. Left ventricular ejection fraction, by estimation, is 55 to 60%. The  left ventricle has normal function. The left ventricle has no regional  wall motion abnormalities. There is moderate  asymmetric left ventricular  hypertrophy of the basal-septal  segment. Left ventricular diastolic parameters are consistent with Grade  II diastolic dysfunction (pseudonormalization). Elevated left atrial  pressure.   2. Right ventricular systolic function is normal. The right ventricular  size is normal. There is normal pulmonary artery systolic pressure. The  estimated right ventricular systolic pressure is [id number] mmHg.   3. The mitral valve is normal in structure. Trivial mitral valve  regurgitation.   4. The aortic valve is tricuspid. Aortic valve regurgitation is not  visualized. No aortic stenosis is present.   5. The inferior vena cava is normal in size with <50% respiratory  variability, suggesting right atrial pressure of 8 mmHg.   Echo 11/28/2017 - Left ventricle: The cavity size was normal. There was moderate    concentric hypertrophy. Systolic function was normal. The    estimated ejection fraction was in the range of 55% to 60%. Wall    motion was normal; there were no regional wall motion    abnormalities. Features are consistent with a pseudonormal left    ventricular filling pattern, with concomitant abnormal relaxation    and increased filling pressure (grade 2 diastolic dysfunction).  - Pulmonary arteries: PA peak pressure: 37 mm Hg (S).  - Pericardium,  extracardiac: Ascites was noted.   Impressions:   - Normal LV ejection fraction and no regional wall motion    abnormalities, diastolic dysfunction noted. Elevated right sided    filling pressures. Ascites present.   EKG:  EKG is personally reviewed. 07/25/21: NSR at 66 bpm 06/08/21: sinus rhythm with PACs, 77 bpm 12/04/19: sinus bradycardia at 58 bpm  Recent Labs: 04/07/2022: TSH 2.302 07/13/2022: ALT 17; BUN 30; Creatinine 1.00; Hemoglobin 14.9; Platelet Count 160; Potassium 4.5; Sodium 143  Recent Lipid Panel    Component Value Date/Time   CHOL 279 (H) 05/27/2019 1045   TRIG 78.0 05/27/2019 1045   TRIG 192 (H) 03/17/2006 0820   HDL 118.80 05/27/2019 1045   CHOLHDL 2 05/27/2019 1045   VLDL 15.6 05/27/2019 1045   LDLCALC 145 (H) 05/27/2019 1045   LDLDIRECT 135.9 10/16/2012 0840    Physical Exam:    VS:  BP (!) 154/98   Pulse 66   Ht '5\' 6"'$  (1.676 m)   Wt 132 lb 14.4 oz (60.3 kg)   BMI 21.45 kg/m     Wt Readings from Last 3 Encounters:  07/26/22 132 lb 14.4 oz (60.3 kg)  07/18/22 132 lb 6.4 oz (60.1 kg)  07/13/22 134 lb 8 oz (61 kg)    GEN: frail, cachectic, in no acute distress HEENT: Normal, moist mucous membranes NECK: No JVD CARDIAC: regular rhythm, normal S1 and S2, no rubs or gallops. No murmur. VASCULAR: Radial and DP pulses 2+ bilaterally. No carotid bruits RESPIRATORY:  Clear to auscultation without rales, wheezing or rhonchi  ABDOMEN: Soft, non-tender, non-distended MUSCULOSKELETAL:  Ambulates independently SKIN: Warm and dry, trivial bilateral LE edema with chronic appearing skin discoloration NEUROLOGIC:  Alert and oriented x 3. No focal neuro deficits noted. PSYCHIATRIC:  Normal affect    ASSESSMENT:    1. Bilateral leg edema   2. Essential hypertension   3. Counseling on health promotion and disease prevention   4. Hyperlipidemia LDL goal <100    PLAN:    Lower extremity edema: improved but still present today -echo does not suggest cardiac  etiology -skin discoloration suggests component of venous stasis -reviewed recommendations today  Labile hypertension -would not overtreat as she reports intermittent hypotension -  instructed that pain, illness, stress, etc can raise blood pressure -she will contact me if numbers trend higher -currently on: Carvedilol 25 mg twice a day, Furosemide 20 mg in the morning -No longer on lisinopril 20 mg daily -if BP consistently elevated, may need to adjust meds. She will monitor at home and contact me if they are persistently elevated  Hypercholesterolemia: Lipids reviewed from 05/27/19. Tchol 279, HDL 118, LDL 145, TG 78 We have previously discussed, given age/numbers, will not start statin  Cardiac risk counseling and prevention recommendations: -recommend heart healthy/Mediterranean diet, with whole grains, fruits, vegetable, fish, lean meats, nuts, and olive oil. Limit salt. -recommend moderate walking, 3-5 times/week for 30-50 minutes each session. Aim for at least 150 minutes.week. Goal should be pace of 3 miles/hours, or walking 1.5 miles in 30 minutes -recommend avoidance of tobacco products. Avoid excess alcohol. -ASCVD risk score: The ASCVD Risk score (Arnett DK, et al., 2019) failed to calculate for the following reasons:   The 2019 ASCVD risk score is only valid for ages 24 to 77    Plan for follow up: I will see her back in 1 year or sooner as needed  [name], MD, PhD [place]  [facility]    Medication Adjustments/Labs and Tests Ordered: Current medicines are reviewed at length with the patient today.  Concerns regarding medicines are outlined above.  Orders Placed This Encounter  Procedures   EKG 12-Lead   No orders of the defined types were placed in this encounter.   Patient Instructions  Let's go back to checking home blood pressure at least a few times per week. If it is consistently >140/90, call and let me know.  how to check blood  pressure:  -sit comfortably in a chair, feet uncrossed and flat on floor, for 5-10 minutes  -arm ideally should rest at the level of the heart. However, arm should be relaxed and not tense (for example, do not hold the arm up unsupported)  -avoid exercise, caffeine, and tobacco for at least 30 minutes prior to BP reading  -don't take BP cuff reading over clothes (always place on skin directly)  -I prefer to know how well the medication is working, so I would like you to take your readings 1-2 hours after taking your blood pressure medication if possible   Your physician wants you to follow-up in: 1 YEAR  You will receive a reminder letter in the mail two months in advance. If you don't receive a letter, please call our office to schedule the follow-up appointment.   Signed, [name], MD PhD 07/27/2022   [facility]

## 2022-07-26 NOTE — Telephone Encounter (Signed)
Pt called to request that CMA call her back to discuss a concern she has regarding some medication.   Pt was asked if I could help her, and Pt stated she already spoke to the Triage Nurse, and she just wants CMA to call her back.

## 2022-07-26 NOTE — Telephone Encounter (Signed)
Pt call and want a call back about some new medication .

## 2022-07-27 ENCOUNTER — Other Ambulatory Visit: Payer: Self-pay

## 2022-07-27 ENCOUNTER — Ambulatory Visit (HOSPITAL_COMMUNITY)
Admission: RE | Admit: 2022-07-27 | Discharge: 2022-07-27 | Disposition: A | Discharge status: Home / Self Care | Payer: Medicare Other | Source: Ambulatory Visit | Attending: Hematology | Admitting: Hematology

## 2022-07-27 ENCOUNTER — Encounter (HOSPITAL_BASED_OUTPATIENT_CLINIC_OR_DEPARTMENT_OTHER): Payer: Self-pay | Admitting: Cardiology

## 2022-07-27 DIAGNOSIS — C153 Malignant neoplasm of upper third of esophagus: Secondary | ICD-10-CM | POA: Diagnosis present

## 2022-07-27 MED ORDER — ALBUTEROL SULFATE HFA 108 (90 BASE) MCG/ACT IN AERS: 1.0000 | INHALATION_SPRAY | Freq: Four times a day (QID) | RESPIRATORY_TRACT | 0 refills | Status: DC | PRN

## 2022-07-27 NOTE — Telephone Encounter (Signed)
Ok to rx albuterol hfa  inhaler disp 1  1-2 puffs ewvery 6 hours as needed for wheezing  Needs    in person visit  in follow up  to assess  wheezing

## 2022-07-27 NOTE — Telephone Encounter (Signed)
Rx sent. Attempt to reach pt for appt. Left a voicemail to call us back.

## 2022-07-28 NOTE — Assessment & Plan Note (Signed)
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 EGD 9/15 and CT neck 02/05/21 showed 6 cm mass in the cervical/upper esophagus with possible local adenopathy. 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 Kipp Brood feels she is not a surgical candidate.  -she received concurrent chemoRT with carbo/taxol 03/08/21 - 04/14/21.  -given PDL1+, she began immunotherapy with Nivolumab on 05/21/21, and completed 1 year therapy in December 2023. -last EGD in November 2023 showed benign stenosis, no evidence of recurrence or residual disease. -restaging CT CAP on 07/27/2022 showed no evidence of disease.  I personally reviewed this imaging and discussed the findings with patient -She is clinically doing well.  Will continue monitoring.

## 2022-07-29 ENCOUNTER — Inpatient Hospital Stay: Payer: Medicare Other | Attending: Nurse Practitioner | Admitting: Hematology

## 2022-07-29 ENCOUNTER — Encounter: Payer: Self-pay | Admitting: Hematology

## 2022-07-29 DIAGNOSIS — C153 Malignant neoplasm of upper third of esophagus: Secondary | ICD-10-CM | POA: Diagnosis not present

## 2022-07-29 MED ORDER — METHYLPREDNISOLONE 4 MG PO TBPK: ORAL_TABLET | ORAL | 0 refills | Status: DC

## 2022-07-29 NOTE — Progress Notes (Signed)
[place]   [phone] [phone]   Clinic Follow up Note   Patient Care Team: [name], [name], MD as PCP - General [name], MD as PCP - Cardiology (Cardiology) Cindie Crumbly (Neurosurgery) Irine Seal, MD as Attending Physician (Urology) Erline Levine, MD as Consulting Physician (Neurosurgery) [name], MD as Consulting Physician (Neurology) Viona Gilmore, [facility] (Inactive) as Pharmacist (Pharmacist) [name], MD as Consulting Physician (Gastroenterology) Carol Ada, MD as Consulting Physician (Gastroenterology) [name], MD (Inactive) as Consulting Physician (Otolaryngology) [name], MD as Consulting Physician (Hematology) Kyung Rudd, MD as Consulting Physician (Radiation Oncology) Kyung Rudd, MD as Consulting Physician (Radiation Oncology) 07/29/2022  I connected with [name] on 07/29/22 at  9:40 AM EST by telephone and verified that I am speaking with the correct person using two identifiers.   I discussed the limitations, risks, security and privacy concerns of performing an evaluation and management service by telephone and the availability of in person appointments. I also discussed with the patient that there may be a patient responsible charge related to this service. The patient expressed understanding and agreed to proceed.   Patient's location:  Home  Provider's location:  Office    CHIEF COMPLAINT:    CURRENT THERAPY:   ASSESSMENT & PLAN: 82 yo female  Cough and dyspnea  -started 1-2 months ago, mainly dry cough, with wheezing, worse when she lays down. -I reviewed her CT chest without contrast, which showed no evidence of metastatic disease or acute findings. -She was recently evaluated by cardiologist, no evidence of congestive heart failure -Not sure this is chronic bronchitis, versus post infectious cough, she never smoked.  She has completed immunotherapy 2 to 3  months ago. -She will continue use albuterol inhaler as needed, I called in a Solu-Medrol Dosepak to see if it helps her cough and dyspnea.  If does not help, she will call me and I will refer her to pulmonary.   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 EGD 9/15 and CT neck 02/05/21 showed 6 cm mass in the cervical/upper esophagus with possible local adenopathy. 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 Kipp Brood feels she is not a surgical candidate.  -she received concurrent chemoRT with carbo/taxol 03/08/21 - 04/14/21.  -given PDL1+, she began immunotherapy with Nivolumab on 05/21/21, and completed 1 year therapy in December 2023. -last EGD in November 2023 showed benign stenosis, no evidence of recurrence or residual disease. -restaging CT CAP on 07/27/2022 showed no evidence of disease.  I personally reviewed this imaging and discussed the findings with patient -She is clinically doing well.  Will continue monitoring.  Plan -I reviewed her CT scan findings -I will call in methylprednisolone Dosepak -If her cough and dyspnea does not improve after steroid, she will call me and I will refer her to pulmonary -I will see her back in May as scheduled. -I spoke with her daughter who is a physician.  SUMMARY OF ONCOLOGIC HISTORY: Oncology History Overview Note   Cancer Staging  Malignant neoplasm of upper third esophagus ([place]) Staging form: Esophagus - Squamous Cell Carcinoma, AJCC 8th Edition - Clinical stage from 02/12/2021: Stage Unknown (cTX, cN2, cM0) - Signed by [name], MD on 03/08/2021 Stage prefix: Initial diagnosis     Malignant neoplasm of upper third esophagus ([place])  12/21/2020 Imaging   Laryngoscopy Comments:  On fiberoptic laryngoscopy through the right nostril the nasopharynx was clear.  The base of tongue vallecula and  epiglottis were normal.    Piriform sinuses were clear bilaterally and vocal cords were clear with  normal vocal mobility.  No structural abnormalities noted.   01/22/2021 Imaging   DG esophagus IMPRESSION: 1. Luminal narrowing in the high cervical esophagus over a 3 cm segment is highly concerning for esophageal neoplasm. Inflammatory process would be a secondary consideration. Luminal narrowing occurs approximately at the C5-C7 vertebral body level just below the glottis. Patient experienced several episodes of choking related to this luminal narrowing. Recommend expedient upper GI endoscopy for evaluation. 2. Distal thoracic esophagus and GE junction appear normal.   02/04/2021 Procedure   EGD by Dr. Carlean Purl impression- One mass-like severe stenosis was found in the upper third of the esophagus. The stenosis was not traversed.   02/05/2021 Imaging   CT soft tissue neck w contrast IMPRESSION: Mass-like soft tissue thickening and mucosal hyperenhancement of the cervical esophagus and hypopharynx, spanning the C4-T1 levels, likely reflecting an esophageal malignancy. This measures up to 2.4 x 3.4 cm in transaxial dimensions, and 6.3 cm in craniocaudal dimension. Associated severe effacement of the esophageal lumen.   Centrally necrotic right paratracheal lymph node beneath the level of the mass, measuring 1.3 x 0.9 cm, and likely reflecting a site of nodal metastatic disease.   Additional lymph nodes along the posterior and inferior aspect of the right thyroid lobe (along the right aspect of the esophageal mass), which measure subcentimeter but are asymmetrically prominent and highly suspicious for additional sites of nodal metastatic disease.   02/12/2021 Pathology Results   FINAL MICROSCOPIC DIAGNOSIS:  A. ESOPHAGUS, UPPER CERVICAL #1, BIOPSY:  - Squamous cell carcinoma.  B. ESOPHAGUS, UPPER CERVICAL #2, BIOPSY:  - Squamous cell carcinoma.    02/12/2021 Cancer Staging   Staging form:  Esophagus - Squamous Cell Carcinoma, AJCC 8th Edition - Clinical stage from 02/12/2021: Stage Unknown (cTX, cN2, cM0) - Signed by [name], MD on 03/08/2021 Stage prefix: Initial diagnosis   02/18/2021 Initial Diagnosis   Malignant neoplasm of upper third esophagus ([place])   02/23/2021 PET scan   IMPRESSION: Markedly hypermetabolic focal masslike thickening of the cervical esophagus, compatible with primary esophageal malignancy.   Markedly hypermetabolic right upper paratracheal lymph node, compatible with metastatic disease.   Small right upper paratracheal and right level IIA lymph nodes with mild hypermetabolic activity, concerning for additional sites of nodal metastatic disease.   Aortic Atherosclerosis (ICD10-I70.0).   03/09/2021 - 04/13/2021 Chemotherapy   Patient is on Treatment Plan : ESOPHAGUS Carboplatin/PACLitaxel weekly x 6 weeks with XRT       05/21/2021 - 01/13/2022 Chemotherapy   Patient is on Treatment Plan : GASTROESOPHAGEAL Nivolumab q14d x 8 cycles / Nivolumab q28d     05/21/2021 -  Chemotherapy   Patient is on Treatment Plan : GASTROESOPHAGEAL Nivolumab (240) q14d x 8 cycles / Nivolumab (480) q28d       INTERVAL HISTORY: Pt is here for a routine phone visit for follow up.  She is still complains of persistent dry cough, no fever, chills, or chest pain.  She does have dyspnea and wheezing, specially when she walks and lay down.  She has mild bilateral lower extremity edema, which resolves in the early morning.  She was seen by cardiologist earlier this week, does not feel she has congestive heart failure.   REVIEW OF SYSTEMS:   Constitutional: Denies fevers, chills or abnormal weight  loss Eyes: Denies blurriness of vision Ears, nose, mouth, throat, and face: Denies mucositis or sore throat Respiratory: see above  Cardiovascular: Denies palpitation, chest discomfort or lower extremity swelling Gastrointestinal:  Denies nausea, heartburn or change in bowel  habits Skin: Denies abnormal skin rashes Lymphatics: Denies new lymphadenopathy or easy bruising Neurological:Denies numbness, tingling or new weaknesses Behavioral/Psych: Mood is stable, no new changes  All other systems were reviewed with the patient and are negative.  MEDICAL HISTORY:  Past Medical History:  Diagnosis Date   Abdominal pain 05/29/2013   s/p rx of cephalo resistant e coli   but last rx NG  now residular ?  bladder sx repeat cx sx rx to ty and uro consult    ADJ DISORDER WITH MIXED ANXIETY & DEPRESSED MOOD 03/03/2010   Qualifier: Diagnosis of  By: [name], [name]    Agent resistant to multiple antibiotics 05/29/2013   e coli   bu NG on fu.     Anemia    Anxiety    ARF (acute renal failure) (HCC) 03/12/2015   Closed head injury 02/01/2011   from syncope and had scalp laceration  neg ct .     Closed head injury 5-6 yrs ago   Colitis [id number]   Complication of anesthesia    migraine several hours after general anesthesia   Depression    esophageal ca 01/2021   Fatty liver    Gall stones 2016   see ct scan neg HIDA    GERD (gastroesophageal reflux disease)    Hearing aid worn    HOH (hard of hearing)    both ears   Hyperlipidemia    Hypertension    echo nl lv function  mild dilitation 2009   Kidney infection    few yrs ago in hospital   Medication side effect 09/02/2010   Poss muscle se of 10 crestor    Migraine    hypnic HA eval by Dr. 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    Pyelonephritis 03/12/2015   Sensation of pain in anesthetized distribution of trigeminal nerve    Syncope 02/01/2011   In shower on vacation  sustained head laceration  8 sutures Had ed visit neg head ct labs and x ray    Trigeminal neuralgia pain     SURGICAL HISTORY: Past Surgical History:  Procedure Laterality Date   ABDOMINAL HYSTERECTOMY  2002   tubal   BACK SURGERY     2 times, for sciatic nerve pain   BALLOON DILATION  06/21/2021    Procedure: DILATION BALLOON used for esophageal stricture;  Surgeon: Irving Copas., MD;  Location: [facility];  Service: Gastroenterology;;   CARDIAC CATHETERIZATION  2000   chest pains neg   CHOLECYSTECTOMY N/A 02/21/2017   Procedure: LAPAROSCOPIC CHOLECYSTECTOMY WITH INTRAOPERATIVE CHOLANGIOGRAM;  Surgeon: Armandina Gemma, MD;  Location: [facility];  Service: General;  Laterality: N/A;   COLONOSCOPY     multiple   CRANIOTOMY  12/09/2011   nerve decompression right trigeminal    DIRECT LARYNGOSCOPY N/A 02/12/2021   Procedure: DIRECT LARYNGOSCOPY AND BIOPSY POSSIBLE FROZEN;  Surgeon: [name], MD;  Location: [place];  Service: ENT;  Laterality: N/A;   DOPPLER ECHOCARDIOGRAPHY  2009   nl lv function mild lv dilitation   ESOPHAGOGASTRODUODENOSCOPY (EGD) WITH PROPOFOL N/A 06/21/2021   Procedure: ESOPHAGOGASTRODUODENOSCOPY (EGD) WITH PROPOFOL;  Surgeon: Irving Copas., MD;  [facility]: [facility];  Service: Gastroenterology;  Laterality: N/A;  Request Fluoroscopy; Plan in for dilation   ESOPHAGOGASTRODUODENOSCOPY (EGD) WITH PROPOFOL N/A 07/19/2021   Procedure: ESOPHAGOGASTRODUODENOSCOPY (EGD) WITH PROPOFOL;  Surgeon: Rush Landmark Telford Nab., MD;  Location: [facility];  Service: Endoscopy;  Laterality: N/A;  Dilation fluoro   ESOPHAGOGASTRODUODENOSCOPY (EGD) WITH PROPOFOL N/A 08/05/2021   Procedure: ESOPHAGOGASTRODUODENOSCOPY (EGD) WITH PROPOFOL - fluoro;  Surgeon: Rush Landmark Telford Nab., MD;  Location: [place];  Service: Gastroenterology;  Laterality: N/A;   ESOPHAGOGASTRODUODENOSCOPY (EGD) WITH PROPOFOL N/A 08/23/2021   Procedure: ESOPHAGOGASTRODUODENOSCOPY (EGD) WITH PROPOFOL;  Surgeon: [name], MD;  [facility]: [facility];  Service: Gastroenterology;  Laterality: N/A;  Using Fluoroscopy and esophageal dilation   ESOPHAGOGASTRODUODENOSCOPY (EGD) WITH PROPOFOL N/A 09/22/2021   Procedure: ESOPHAGOGASTRODUODENOSCOPY (EGD) WITH PROPOFOL;  Surgeon:  [name], MD;  [facility];  Service: Gastroenterology;  Laterality: N/A;  EGD with savary and flour   ESOPHAGOGASTRODUODENOSCOPY (EGD) WITH PROPOFOL N/A 12/07/2021   Procedure: ESOPHAGOGASTRODUODENOSCOPY (EGD) WITH PROPOFOL;  Surgeon: [name], MD;  [facility]: [facility];  Service: Gastroenterology;  Laterality: N/A;  Schedule this with Fluroscopy, it is a savary dilation   ESOPHAGOGASTRODUODENOSCOPY (EGD) WITH PROPOFOL N/A 04/05/2022   Procedure: ESOPHAGOGASTRODUODENOSCOPY (EGD) WITH PROPOFOL;  Surgeon: [name], MD;  [facility];  Service: Gastroenterology;  Laterality: N/A;  EGD /savary dilation with fluoro   EYE SURGERY Bilateral    ioc for catatracts   GASTROSTOMY N/A 02/26/2021   Procedure: OPEN GASTROSTOMY TUBE PLACEMENT;  Surgeon: Armandina Gemma, MD;  Location: [facility];  Service: General;  Laterality: N/A;   IR GASTROSTOMY TUBE MOD SED  02/22/2021   IR GASTROSTOMY TUBE REMOVAL  02/10/2022   IR REPLACE G-TUBE SIMPLE WO FLUORO  09/07/2021   laparoscopic gallbladder surgery  02/16/2017   Fax from [place] Bilateral 2002   rt shoulder surgery     SAVORY DILATION N/A 06/21/2021   Procedure: SAVORY DILATION;  Surgeon: Irving Copas., MD;  Location: [facility];  Service: Gastroenterology;  Laterality: N/A;   SAVORY DILATION N/A 07/19/2021   Procedure: SAVORY DILATION;  Surgeon: Rush Landmark Telford Nab., MD;  Location: [facility];  Service: Endoscopy;  Laterality: N/A;   SAVORY DILATION N/A 08/05/2021   Procedure: SAVORY DILATION;  Surgeon: Rush Landmark Telford Nab., MD;  Location: [place];  Service: Gastroenterology;  Laterality: N/A;   SAVORY DILATION N/A 08/23/2021   Procedure: SAVORY DILATION;  Surgeon: [name], MD;  Location: [facility];  Service: Gastroenterology;  Laterality: N/A;   SAVORY DILATION N/A 09/22/2021   Procedure: SAVORY DILATION;  Surgeon: [name], MD;  [facility]: [facility];   Service: Gastroenterology;  Laterality: N/A;   SAVORY DILATION N/A 12/07/2021   Procedure: SAVORY DILATION;  Surgeon: [name], MD;  [facility];  Service: Gastroenterology;  Laterality: N/A;   SAVORY DILATION N/A 04/05/2022   Procedure: SAVORY DILATION;  Surgeon: [name], MD;  Location: [facility];  Service: Gastroenterology;  Laterality: N/A;    I have reviewed the social history and family history with the patient and they are unchanged from previous note.  ALLERGIES:  is allergic to bactrim [sulfamethoxazole-trimethoprim], hydrocodone, and sulfa antibiotics.  MEDICATIONS:  Current Outpatient Medications  Medication Sig Dispense Refill   methylPREDNISolone (MEDROL DOSEPAK) 4 MG TBPK tablet Once daily with breakfast. Take 6 tabs for first day then decrease by 1 tab daily until finish. 21 tablet 0   acetaminophen (TYLENOL) 500 MG tablet Take 500-1,000 mg by mouth  every 4 (four) hours as needed for headache, mild pain or moderate pain.     albuterol (VENTOLIN HFA) 108 (90 Base) MCG/ACT inhaler Inhale 1-2 puffs into the lungs every 6 (six) hours as needed for wheezing or shortness of breath. 8 g 0   Calcium Carb-Cholecalciferol 600-20 MG-MCG TABS Take by mouth.     carvedilol (COREG) 25 MG tablet TAKE 1 TABLET(25 MG) BY MOUTH TWICE DAILY WITH A MEAL 60 tablet 2   conjugated estrogens (PREMARIN) vaginal cream Place 1 applicator vaginally once a week.     Ferrous Sulfate (IRON PO) Take 1 tablet by mouth daily.     fluticasone (FLONASE) 50 MCG/ACT nasal spray Place 2 sprays into both nostrils as needed for allergies or rhinitis. (Patient taking differently: Place 2 sprays into both nostrils daily as needed for allergies or rhinitis.) 16 g 5   furosemide (LASIX) 20 MG tablet TAKE 1 TABLET(20 MG) BY MOUTH DAILY 90 tablet 0   guaiFENesin-codeine 100-10 MG/5ML syrup PLACE 5ML INTO FEEDING TUBE TWICE DAILY FOR COUGH 236 mL 0   lamoTRIgine (LAMICTAL) 100 MG tablet PLACE 2.5  TABLETS INTO THE FEEDING TUBE TWICE DAILY 150 tablet 1   loperamide (IMODIUM) 2 MG capsule 2 mg daily as needed for diarrhea or loose stools. Per tube     Multiple Minerals-Vitamins (CAL-MAG-ZINC-D PO) Take 1 tablet by mouth 2 (two) times daily.     Multiple Vitamin (MULTIVITAMIN WITH MINERALS) TABS tablet Place 1 tablet into feeding tube in the morning.     mupirocin ointment (BACTROBAN) 2 %      neomycin-polymyxin-hydrocortisone (CORTISPORIN) 3.5-10000-1 OTIC suspension Apply 1-2 drops daily after soaking and cover with bandaid 10 mL 0   Nutritional Supplements (NUTREN 1.5) LIQD Place 237 mLs into feeding tube in the morning and at bedtime.     omeprazole (PRILOSEC) 20 MG capsule TAKE 1 CAPSULE(20 MG) BY MOUTH DAILY 30 capsule 2   ondansetron (ZOFRAN-ODT) 4 MG disintegrating tablet DISSOLVE 1 TO 2 TABLETS(4 TO 8 MG) ON THE TONGUE EVERY 8 HOURS AS NEEDED FOR NAUSEA OR VOMITING 30 tablet 1   Polyethyl Glycol-Propyl Glycol (SYSTANE) 0.4-0.3 % GEL ophthalmic gel Place 1 application into both eyes 2 (two) times daily as needed (dry eyes).     prochlorperazine (COMPAZINE) 10 MG tablet TAKE 1 TABLET(10 MG) BY MOUTH EVERY 6 HOURS AS NEEDED FOR NAUSEA OR VOMITING (Patient taking differently: Take 10 mg by mouth every 6 (six) hours as needed for nausea or vomiting.) 30 tablet 1   No current facility-administered medications for this visit.    PHYSICAL EXAMINATION: Not performed   LABORATORY DATA:  I have reviewed the data as listed    Latest Ref Rng & Units 07/13/2022   11:37 AM 05/05/2022   12:51 PM 04/07/2022    3:04 PM  CBC  WBC 4.0 - 10.5 K/uL 5.1  4.9  4.6   Hemoglobin 12.0 - 15.0 g/dL 14.9  15.1  13.9   Hematocrit 36.0 - 46.0 % 45.0  45.4  42.6   Platelets 150 - 400 K/uL 160  158  145         Latest Ref Rng & Units 07/13/2022   11:37 AM 05/05/2022   12:51 PM 04/07/2022    3:04 PM  CMP  Glucose 70 - 99 mg/dL 101  92  92   BUN 8 - 23 mg/dL '30  29  29   '$ Creatinine 0.44 - 1.00 mg/dL  1.00  0.92  0.94  Sodium 135 - 145 mmol/L 143  141  142   Potassium 3.5 - 5.1 mmol/L 4.5  3.9  4.5   Chloride 98 - 111 mmol/L 102  104  101   CO2 22 - 32 mmol/L 38  34  38   Calcium 8.9 - 10.3 mg/dL 10.5  10.5  10.6   Total Protein 6.5 - 8.1 g/dL 6.6  6.2  6.6   Total Bilirubin 0.3 - 1.2 mg/dL 0.8  0.8  0.7   Alkaline Phos 38 - 126 U/L 73  85  74   AST 15 - 41 U/L '22  26  22   '$ ALT 0 - 44 U/L '17  18  18       '$ RADIOGRAPHIC STUDIES: I have personally reviewed the radiological images as listed and agreed with the findings in the report. No results found.     I discussed the assessment and treatment plan with the patient. The patient was provided an opportunity to ask questions and all were answered. The patient agreed with the plan and demonstrated an understanding of the instructions.   The patient was advised to call back or seek an in-person evaluation if the symptoms worsen or if the condition fails to improve as anticipated.  I provided 25 minutes of non face-to-face telephone visit time during this encounter, and > 50% was spent counseling as documented under my assessment & plan.     [name], MD 07/29/22

## 2022-07-29 NOTE — Telephone Encounter (Signed)
Spoke to pt. Pt reports she had pick up the prescription.   Scheduled pt an appt with Dr. Sarajane Jews on 08/01/2022.

## 2022-08-01 ENCOUNTER — Ambulatory Visit: Payer: Medicare Other | Admitting: Family Medicine

## 2022-08-03 ENCOUNTER — Ambulatory Visit (HOSPITAL_COMMUNITY)
Admission: RE | Admit: 2022-08-03 | Discharge: 2022-08-03 | Disposition: A | Discharge status: Home / Self Care | Payer: Medicare Other | Source: Ambulatory Visit | Attending: Adult Health | Admitting: Adult Health

## 2022-08-03 ENCOUNTER — Encounter: Payer: Self-pay | Admitting: Internal Medicine

## 2022-08-03 DIAGNOSIS — G500 Trigeminal neuralgia: Secondary | ICD-10-CM | POA: Diagnosis not present

## 2022-08-03 DIAGNOSIS — R9089 Other abnormal findings on diagnostic imaging of central nervous system: Secondary | ICD-10-CM | POA: Insufficient documentation

## 2022-08-03 LAB — HM MAMMOGRAPHY

## 2022-08-03 MED ORDER — GADOBUTROL 1 MMOL/ML IV SOLN
6.0000 mL | Freq: Once | INTRAVENOUS | Status: AC | PRN
  Administered 2022-08-03: 6 mL via INTRAVENOUS

## 2022-08-04 ENCOUNTER — Encounter: Payer: Self-pay | Admitting: Hematology

## 2022-08-04 NOTE — Progress Notes (Signed)
Care Management & Coordination Services Pharmacy Note  08/08/2022 Name:  [name] MRN:  [id number] DOB:  08/13/1940  Summary: BP not at goal <140/90 LDL not at goal <100 (last checked 2021) Denies any falls in last 3 months  Recommendations/Changes made from today's visit: -Check BP 2-3 times weekly and keep a log, no log present for appt today -ORDER updated lipid panel, with PCP approval -Continue vitamin D for bone health, would be due for an updated DEXA scan, will discuss at next visit (pt just completed esophageal cancer treatment)  Follow up plan: BP call in 1 and 5 months Pharmacist visit in 6 months   Subjective: [name] is an 82 y.o. year old female who is a primary patient of [name], [name], MD.  The care coordination team was consulted for assistance with disease management and care coordination needs.    Engaged with patient by telephone for follow up visit.  Recent office visits: 07/26/22 [name], MD - Orders only - Albuterol HFA inhaler prn wheezing after patient phone call  04/26/22 Dorothyann Peng, NP - Patient presented for Multiple skin tears and other concerns. Prescribed Doxycycline.   04/18/22 [name], [name], MD - Patient presented for Preventative health examination and other concerns. Stopped Benazepril.   04/12/22 [name], LPN - Patient presented for Medicare Annual Wellness Exam. No medication changes.   Recent consult visits: 07/29/22 [name], MD (Oncology) - For Malignant neoplasm of esophagus, START medrol taper  07/26/2022 [name], MD (Cardio) For leg edema and HTN, no med changes  07/18/22 [name], NP (Neuro) - For Trigeminal Neuralgia, no med changes, MRI ordered  04/25/22 [name], [name], RN - Claims encounter for ingrowing right great toenail. No medication changes    04/11/22 [name], MD (Oncology) - Patient presented for Malignant neoplasm of upper third esophagus. No  medication changes.    04/07/22 - Patient presented to [facility] for infusion.    03/30/22 [name], DPM  - Patient presented for Ingrown right toenail. No medication changes.    03/17/22 [name], MD (Neurology) - Patient presented for Numbness and tingling of right face and other concerns. No medication changes.    02/10/22 [name], MD (Oncology) - Patient presented for Malignant neoplasm of upper third esophagus. No medication changes.  Hospital visits: 04/05/22 - [facility], LOS 2 hours, for Esophageal stricture dysphagia. START Keppra solution, Prilosec 20mg  and Carafate. Changed Lamictal.   Objective:  Lab Results  Component Value Date   CREATININE 1.00 07/13/2022   BUN 30 (H) 07/13/2022   GFR 54.44 (L) 12/27/2021   EGFR 80 03/24/2021   GFRNONAA 56 (L) 07/13/2022   GFRAA 62 07/08/2020   NA 143 07/13/2022   K 4.5 07/13/2022   CALCIUM 10.5 (H) 07/13/2022   CO2 38 (H) 07/13/2022   GLUCOSE 101 (H) 07/13/2022    Lab Results  Component Value Date/Time   HGBA1C 5.2 11/09/2021 12:31 PM   HGBA1C 5.5 05/25/2018 10:52 AM   GFR 54.44 (L) 12/27/2021 03:16 PM   GFR 58.37 (L) 02/04/2021 03:31 PM    Last diabetic Eye exam: No results found for: "HMDIABEYEEXA"  Last diabetic Foot exam: No results found for: "HMDIABFOOTEX"   Lab Results  Component Value Date   CHOL 279 (H) 05/27/2019   HDL 118.80 05/27/2019   LDLCALC 145 (H) 05/27/2019   LDLDIRECT 135.9 10/16/2012   TRIG 78.0 05/27/2019   CHOLHDL 2 05/27/2019  Latest Ref Rng & Units 07/13/2022   11:37 AM 05/05/2022   12:51 PM 04/07/2022    3:04 PM  Hepatic Function  Total Protein 6.5 - 8.1 g/dL 6.6  6.2  6.6   Albumin 3.5 - 5.0 g/dL 4.0  4.0  4.0   AST 15 - 41 U/L 22  26  22    ALT 0 - 44 U/L 17  18  18    Alk Phosphatase 38 - 126 U/L 73  85  74   Total Bilirubin 0.3 - 1.2 mg/dL 0.8  0.8  0.7     Lab Results  Component Value Date/Time   TSH 2.302 04/07/2022  03:04 PM   TSH 1.697 01/13/2022 09:50 AM   TSH 1.97 12/27/2021 03:16 PM   TSH 0.88 07/01/2020 02:49 PM   FREET4 0.74 05/30/2017 08:01 AM       Latest Ref Rng & Units 07/13/2022   11:37 AM 05/05/2022   12:51 PM 04/07/2022    3:04 PM  CBC  WBC 4.0 - 10.5 K/uL 5.1  4.9  4.6   Hemoglobin 12.0 - 15.0 g/dL 14.9  15.1  13.9   Hematocrit 36.0 - 46.0 % 45.0  45.4  42.6   Platelets 150 - 400 K/uL 160  158  145     Lab Results  Component Value Date/Time   VD25OH 19.97 (L) 05/25/2018 10:52 AM   VITAMINB12 933 (H) 07/01/2020 02:49 PM   VITAMINB12 825 05/30/2017 08:01 AM    Clinical ASCVD: No  The ASCVD Risk score (Arnett DK, et al., 2019) failed to calculate for the following reasons:   The 2019 ASCVD risk score is only valid for ages 10 to 32        04/12/2022    3:39 PM 01/27/2022    1:14 PM 12/27/2021    2:43 PM  Depression screen PHQ 2/9  Decreased Interest 0 0 0  Down, Depressed, Hopeless 0 0 0  PHQ - 2 Score 0 0 0  Altered sleeping  0 0  Tired, decreased energy  0 1  Change in appetite  0 0  Feeling bad or failure about yourself   0 0  Trouble concentrating  0 0  Moving slowly or fidgety/restless  0 0  Suicidal thoughts  0 0  PHQ-9 Score  0 1  Difficult doing work/chores   Not difficult at all     Social History   Tobacco Use  Smoking Status Never  Smokeless Tobacco Never   BP Readings from Last 3 Encounters:  07/26/22 (!) 154/98  07/18/22 (!) 163/83  07/13/22 (!) 150/80   Pulse Readings from Last 3 Encounters:  07/26/22 66  07/18/22 72  07/13/22 66   Wt Readings from Last 3 Encounters:  07/26/22 132 lb 14.4 oz (60.3 kg)  07/18/22 132 lb 6.4 oz (60.1 kg)  07/13/22 134 lb 8 oz (61 kg)   BMI Readings from Last 3 Encounters:  07/26/22 21.45 kg/m  07/18/22 21.37 kg/m  07/13/22 22.21 kg/m    Allergies  Allergen Reactions   Bactrim [Sulfamethoxazole-Trimethoprim] Hives and Itching   Hydrocodone Other (See Comments)    Rebound headaches   Sulfa  Antibiotics Other (See Comments)    Medications Reviewed Today     Reviewed by Maren Reamer, RPH (Pharmacist) on 08/08/22 at 1315  Med List Status: <None>   Medication Order Taking? Sig Documenting Provider Last Dose Status Informant  acetaminophen (TYLENOL) 500 MG tablet [id number] No Take 500-1,000 mg by mouth  every 4 (four) hours as needed for headache, mild pain or moderate pain. [provider] Taking Active   albuterol (VENTOLIN HFA) 108 (90 Base) MCG/ACT inhaler [id number]  Inhale 1-2 puffs into the lungs every 6 (six) hours as needed for wheezing or shortness of breath. [name], MD  Active   Calcium Carb-Cholecalciferol 600-20 MG-MCG TABS [id number] No Take by mouth. [provider] Taking Active   carvedilol (COREG) 25 MG tablet [id number] No TAKE 1 TABLET(25 MG) BY MOUTH TWICE DAILY WITH A MEAL Dutch Quint B, FNP Taking Active   conjugated estrogens (PREMARIN) vaginal cream [id number] No Place 1 applicator vaginally once a week. [provider] Taking Active Spouse/Significant Other  Ferrous Sulfate (IRON PO) [id number] No Take 1 tablet by mouth daily. [provider] Taking Active   fluticasone (FLONASE) 50 MCG/ACT nasal spray [id number] No Place 2 sprays into both nostrils as needed for allergies or rhinitis.  Patient taking differently: Place 2 sprays into both nostrils daily as needed for allergies or rhinitis.   [name], [name], MD Taking Active Spouse/Significant Other           Med Note Burna Sis Apr 01, 2022 12:03 PM)    furosemide (LASIX) 20 MG tablet [id number] No TAKE 1 TABLET(20 MG) BY MOUTH DAILY [name], [name], MD Taking Active   guaiFENesin-codeine 100-10 MG/5ML syrup [id number]  PLACE 5ML INTO FEEDING TUBE TWICE DAILY FOR COUGH [name], MD  Active   lamoTRIgine (LAMICTAL) 100 MG tablet [id number] No PLACE 2.5 TABLETS INTO THE FEEDING TUBE TWICE DAILY [name], NP Taking Active   loperamide (IMODIUM) 2 MG  capsule [id number] No 2 mg daily as needed for diarrhea or loose stools. Per tube [provider] Taking Active Spouse/Significant Other  methylPREDNISolone (MEDROL DOSEPAK) 4 MG TBPK tablet [id number]  Once daily with breakfast. Take 6 tabs for first day then decrease by 1 tab daily until finish. [name], MD  Active   Multiple Minerals-Vitamins (CAL-MAG-ZINC-D PO) [id number] No Take 1 tablet by mouth 2 (two) times daily. [provider] Taking Active Spouse/Significant Other  Multiple Vitamin (MULTIVITAMIN WITH MINERALS) TABS tablet [id number] No Place 1 tablet into feeding tube in the morning. [provider] Taking Active Spouse/Significant Other  mupirocin ointment (BACTROBAN) 2 % [id number] No  [provider] Taking Active   neomycin-polymyxin-hydrocortisone (CORTISPORIN) 3.5-10000-1 OTIC suspension [id number] No Apply 1-2 drops daily after soaking and cover with bandaid [name], DPM Taking Active   Nutritional Supplements (NUTREN 1.5) LIQD [id number] No Place 237 mLs into feeding tube in the morning and at bedtime. [provider] Taking Active Spouse/Significant Other  omeprazole (PRILOSEC) 20 MG capsule [id number] No TAKE 1 CAPSULE(20 MG) BY MOUTH DAILY Dutch Quint B, FNP Taking Active   ondansetron (ZOFRAN-ODT) 4 MG disintegrating tablet [id number] No DISSOLVE 1 TO 2 TABLETS(4 TO 8 MG) ON THE TONGUE EVERY 8 HOURS AS NEEDED FOR NAUSEA OR VOMITING [name], MD Taking Active   Polyethyl Glycol-Propyl Glycol (SYSTANE) 0.4-0.3 % GEL ophthalmic gel [id number] No Place 1 application into both eyes 2 (two) times daily as needed (dry eyes). [provider] Taking Active Spouse/Significant Other  prochlorperazine (COMPAZINE) 10 MG tablet [id number] No TAKE 1 TABLET(10 MG) BY MOUTH EVERY 6 HOURS AS NEEDED FOR NAUSEA OR VOMITING  Patient taking differently: Take 10 mg by mouth every 6 (six) hours as needed for nausea or vomiting.   [name], MD  Taking Active Spouse/Significant Other  SDOH:  (Social Determinants of Health) assessments and interventions performed: Yes SDOH Interventions    Flowsheet Row Care Coordination from 08/08/2022 in [place] Office Visit from 04/12/2022 in [place] at [place] Management from 02/01/2022 in [place] at [place] Management from 04/03/2020 in [place] at [place] Interventions Intervention Not Indicated Intervention Not Indicated -- --  Housing Interventions -- Intervention Not Indicated -- --  Transportation Interventions Intervention Not Indicated Intervention Not Indicated -- Intervention Not Indicated  Utilities Interventions -- Intervention Not Indicated -- --  Alcohol Usage Interventions -- Intervention Not Indicated (Score <7) -- --  Financial Strain Interventions -- Intervention Not Indicated Intervention Not Indicated Intervention Not Indicated  Physical Activity Interventions -- Intervention Not Indicated -- --  Stress Interventions -- Intervention Not Indicated -- --  Social Connections Interventions -- Intervention Not Indicated -- --       Medication Assistance: None required.  Patient affirms current coverage meets needs.  Medication Access: Within the past 30 days, how often has patient missed a dose of medication? None Is a pillbox or other method used to improve adherence? Yes  Factors that may affect medication adherence? no barriers identified Are meds synced by current pharmacy? No  Are meds delivered by current pharmacy? No  Does patient experience delays in picking up medications due to transportation concerns? No   [facility] Services Reviewed: Is patient disadvantaged to use [facility]?: Yes  Current Rx insurance plan: [place] Name and location of Current pharmacy:  [facility] [place], [address] AT [address] [place] [address] Phone: [phone] Fax: [phone]  [facility] services reviewed with patient today?: No  Patient requests to transfer care to [facility]?: No  Reason patient declined to change pharmacies: Disadvantaged due to insurance/mail order  Compliance/Adherence/Medication fill history: Care Gaps: COVID Booster  Star-Rating Drugs: None   Assessment/Plan   Hypertension (BP goal <140/90) -Uncontrolled -Current treatment: Carvedilol 25mg  BID Appropriate, Query effective Lasix 20mg  1 qd Appropriate, Effective, Safe, Accessible -Medications previously tried: Amlodipine, Benazepril, Chlorthalidone, Clonidine, Metoprolol, Triamterene/HCTZ -Current home readings: 2-3x/week -Current dietary habits: mindful of salt -Current exercise habits: 3x/week -Denies hypotensive/hypertensive symptoms -Educated on BP goals and benefits of medications for prevention of heart attack, stroke and kidney damage; Daily salt intake goal < 2300 mg; Importance of home blood pressure monitoring; -Counseled to monitor BP at home 2-3x weekly, document, and provide log at future appointments -Recommended to continue current medication Counseled on keeping a BP log of readings, will f/u next month to assess control  Hyperlipidemia: (LDL goal < 100) -Uncontrolled -Current treatment: None -Medications previously tried: None  -Educated on Cholesterol goals;  Importance of routine lab monitoring -Recommended updated lipid panel, with PCP approval  Osteoporosis / Osteopenia (Goal Prevent bone fracture) -Not ideally controlled -Last DEXA Scan: 05/28/2018   T-Score femoral neck: LFN -2.0  10-year probability of major osteoporotic fracture: 21.4%  10-year probability of hip fracture: 3% -Patient is a candidate for pharmacologic treatment due to T-Score -1.0 to -2.5 and 10-year risk of major  osteoporotic fracture > 20% -Current treatment  Vitamin D OTC 2000 units Appropriate, Effective, Safe, Accessible -Medications previously tried: Alendronate (esophageal cancer)  -Recommend [phone] units of vitamin D daily. Recommend 1200 mg of calcium daily from dietary and supplemental sources. Recommend weight-bearing and  muscle strengthening exercises for building and maintaining bone density. -Recommended to continue current medication Discussed that a new DEXA Scan is recommended, just finished treatment esophageal cancer treatment, will revisit at next visit  [place] Pharmacist [phone]

## 2022-08-05 ENCOUNTER — Encounter: Payer: Self-pay | Admitting: Internal Medicine

## 2022-08-05 ENCOUNTER — Telehealth: Payer: Self-pay

## 2022-08-05 ENCOUNTER — Telehealth: Payer: Self-pay | Admitting: Adult Health

## 2022-08-05 NOTE — Telephone Encounter (Signed)
Pt called wanting to know if her MRI results have come in and if a Neuro Surgeon has been found for her yet. Please advise.

## 2022-08-05 NOTE — Progress Notes (Signed)
Patient ID: [name], female   DOB: 1941-01-10, 82 y.o.   MRN: [id number]  Care Management & Coordination Services Pharmacy Team  Reason for Encounter: Appointment Reminder  Contacted patient to confirm telephone appointment with Theo Dills, PharmD on 08/08/22 at 1. Spoke with patient on 08/05/2022     Star Rating Drugs:  none   Care Gaps: [place] Clinical Pharmacist Assistant [phone]

## 2022-08-07 ENCOUNTER — Encounter: Payer: Self-pay | Admitting: Neurology

## 2022-08-08 ENCOUNTER — Ambulatory Visit: Payer: Medicare Other

## 2022-08-08 NOTE — Telephone Encounter (Signed)
Sent message to Dr. Jaynee Eagles to get her opinion on MRI results and then I will contact patient. May be tomorrow.

## 2022-08-09 ENCOUNTER — Other Ambulatory Visit: Payer: Self-pay | Admitting: Internal Medicine

## 2022-08-10 ENCOUNTER — Other Ambulatory Visit: Payer: Self-pay

## 2022-08-10 DIAGNOSIS — Z79899 Other long term (current) drug therapy: Secondary | ICD-10-CM

## 2022-08-11 ENCOUNTER — Telehealth: Payer: Self-pay | Admitting: *Deleted

## 2022-08-11 NOTE — Telephone Encounter (Signed)
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  Discussed with Dr. Jaynee Eagles.  We recommend that she go back and see whoever did the gamma knife surgery or we can refer her to another academic center such as [facility]

## 2022-08-17 NOTE — Telephone Encounter (Signed)
Called pt and phone rang multiple times with no answer. I called the pt's husband, Jenny Reichmann (on [place]), and discussed MRI results. He will discuss this with the patient and let us know what she would like to do (return to provider who did her gamma knife surgery OR refer to another academic center such as [facility]). He appreciated the call.

## 2022-08-23 ENCOUNTER — Ambulatory Visit (INDEPENDENT_AMBULATORY_CARE_PROVIDER_SITE_OTHER): Payer: Medicare Other | Admitting: Podiatry

## 2022-08-23 DIAGNOSIS — L600 Ingrowing nail: Secondary | ICD-10-CM

## 2022-08-23 MED ORDER — NEOMYCIN-POLYMYXIN-HC 3.5-10000-1 OT SUSP: OTIC | 0 refills | Status: DC

## 2022-08-23 NOTE — Patient Instructions (Signed)

## 2022-08-25 ENCOUNTER — Encounter: Payer: Self-pay | Admitting: Internal Medicine

## 2022-08-25 ENCOUNTER — Ambulatory Visit (INDEPENDENT_AMBULATORY_CARE_PROVIDER_SITE_OTHER): Payer: Medicare Other | Admitting: Internal Medicine

## 2022-08-25 VITALS — BP 130/72 | HR 56 | Temp 97.4°F | Ht 66.0 in | Wt 134.0 lb

## 2022-08-25 DIAGNOSIS — R682 Dry mouth, unspecified: Secondary | ICD-10-CM | POA: Diagnosis not present

## 2022-08-25 DIAGNOSIS — C153 Malignant neoplasm of upper third of esophagus: Secondary | ICD-10-CM | POA: Diagnosis not present

## 2022-08-25 DIAGNOSIS — L309 Dermatitis, unspecified: Secondary | ICD-10-CM

## 2022-08-25 DIAGNOSIS — R300 Dysuria: Secondary | ICD-10-CM

## 2022-08-25 LAB — POCT URINALYSIS DIPSTICK
Bilirubin, UA: NEGATIVE
Blood, UA: NEGATIVE
Glucose, UA: NEGATIVE
Ketones, UA: NEGATIVE
Nitrite, UA: NEGATIVE
Protein, UA: NEGATIVE
Spec Grav, UA: 1.02 (ref 1.010–1.025)
Urobilinogen, UA: 0.2 E.U./dL
pH, UA: 6 (ref 5.0–8.0)

## 2022-08-25 NOTE — Progress Notes (Signed)
Chief Complaint  Patient presents with   Edema    Pt c/o swelling lips. Going on for a while.   Mouth Lesions    Pt c/o of Ulcer inside of lower lip.   Dysuria    Pt c/o burning sensation when urinate. Going on for at least 3 wks. Denied back pain, chills.     HPI: [name] 82 y.o. 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 Cause of dry mouth  has noted for weeks months  lips sore and swell   bottom only  no fever dysphagia  mouth dry in am  has tried Eucerin .  Dry lip dermatitis .  And chap stick without   additives .   See above   urine sx  .  Urgency .for weeks  and some stinging  not sure if infection   but could be . No fever  Under process of reeval other rx gamma agin for  the tgn to be able to avoid some of the medications  that have se  ROS: See pertinent positives and negatives per HPI. Ingrown toenails better  saw podiatry  eye doc advised left eyelid surgery because of vision effected   Past Medical History:  Diagnosis Date   Abdominal pain 05/29/2013   s/p rx of cephalo resistant e coli   but last rx NG  now residular ?  bladder sx repeat cx sx rx to ty and uro consult    ADJ DISORDER WITH MIXED ANXIETY & DEPRESSED MOOD 03/03/2010   Qualifier: Diagnosis of  By: [name], [name]    Agent resistant to multiple antibiotics 05/29/2013   e coli   bu NG on fu.     Anemia    Anxiety    ARF (acute renal failure) 03/12/2015   Closed head injury 02/01/2011   from syncope and had scalp laceration  neg ct .     Closed head injury 5-6 yrs ago   Colitis [id number]   Complication of anesthesia    migraine several hours after general anesthesia   Depression    esophageal ca 01/2021   Fatty liver    Gall stones 2016   see ct scan neg HIDA    GERD (gastroesophageal reflux disease)    Hearing aid worn    HOH (hard of hearing)    both ears    Hyperlipidemia    Hypertension    echo nl lv function  mild dilitation 2009   Kidney infection    few yrs ago in hospital   Medication side effect 09/02/2010   Poss muscle se of 10 crestor    Migraine    hypnic HA eval by Dr. 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    Pyelonephritis 03/12/2015   Sensation of pain in anesthetized distribution of trigeminal nerve    Syncope 02/01/2011   In shower on vacation  sustained head laceration  8 sutures Had ed visit neg head ct labs and x ray    Trigeminal neuralgia pain     Family History  Problem Relation Age of Onset   Cancer Mother        uterine   Ovarian cancer Mother    Stroke Mother    Alcohol abuse Father  Stroke Father    Diabetes Brother    Cancer Paternal Aunt        leukemia, unknown type   Seizures Daughter    Hypertension Other    Colon cancer Neg Hx    Dementia Neg Hx    Alzheimer's disease Neg Hx     Social History   Socioeconomic History   Marital status: Married    Spouse name: Not on file   Number of children: 2   Years of education: Not on file   Highest education level: Not on file  Occupational History    Comment: retired Forensic psychologist  Tobacco Use   Smoking status: Never   Smokeless tobacco: Never  Vaping Use   Vaping Use: Never used  Substance and Sexual Activity   Alcohol use: Not Currently    Comment: occ wine   Drug use: No   Sexual activity: Not on file  Other Topics Concern   Not on file  Social History Narrative   Married   HH of 2-3 (god daughter)   Pets 2 dogs   Non smoker    Child is a Engineer, drilling   G2P2      Caffeine: 2 cups/day   Social Determinants of Health   Financial Resource Strain: [place]  (04/12/2022)   Overall Financial Resource Strain (CARDIA)    Difficulty of Paying Living Expenses: Not hard at all  Food Insecurity: No Food Insecurity (08/08/2022)   Hunger Vital Sign    Worried About Running Out of Food in the Last Year: Never  true    [place] in the Last Year: Never true  Transportation Needs: No Transportation Needs (08/08/2022)   PRAPARE - Hydrologist (Medical): No    Lack of Transportation (Non-Medical): No  Physical Activity: Insufficiently Active (04/12/2022)   Exercise Vital Sign    Days of Exercise per Week: 3 days    Minutes of Exercise per Session: 30 min  Stress: No Stress Concern Present (04/12/2022)   [place]    Feeling of Stress : Not at all  Social Connections: Moderately Isolated (04/12/2022)   Social Connection and Isolation Panel [NHANES]    Frequency of Communication with Friends and Family: More than three times a week    Frequency of Social Gatherings with Friends and Family: More than three times a week    Attends Religious Services: Never    Marine scientist or Organizations: No    Attends Archivist Meetings: Never    Marital Status: Married    Outpatient Medications Prior to Visit  Medication Sig Dispense Refill   acetaminophen (TYLENOL) 500 MG tablet Take 500-1,000 mg by mouth every 4 (four) hours as needed for headache, mild pain or moderate pain.     albuterol (VENTOLIN HFA) 108 (90 Base) MCG/ACT inhaler Inhale 1-2 puffs into the lungs every 6 (six) hours as needed for wheezing or shortness of breath. 8 g 0   Calcium Carb-Cholecalciferol 600-20 MG-MCG TABS Take by mouth.     carvedilol (COREG) 25 MG tablet TAKE 1 TABLET(25 MG) BY MOUTH TWICE DAILY WITH A MEAL 60 tablet 2   conjugated estrogens (PREMARIN) vaginal cream Place 1 applicator vaginally once a week.     Ferrous Sulfate (IRON PO) Take 1 tablet by mouth daily.     fluticasone (FLONASE) 50 MCG/ACT nasal spray Place 2 sprays into both nostrils as needed  for allergies or rhinitis. (Patient taking differently: Place 2 sprays into both nostrils daily as needed for allergies or rhinitis.) 16 g 5   furosemide  (LASIX) 20 MG tablet TAKE 1 TABLET(20 MG) BY MOUTH DAILY 90 tablet 0   guaiFENesin-codeine 100-10 MG/5ML syrup PLACE 5ML INTO FEEDING TUBE TWICE DAILY FOR COUGH 236 mL 0   lamoTRIgine (LAMICTAL) 100 MG tablet PLACE 2.5 TABLETS INTO THE FEEDING TUBE TWICE DAILY 150 tablet 1   loperamide (IMODIUM) 2 MG capsule 2 mg daily as needed for diarrhea or loose stools. Per tube     methylPREDNISolone (MEDROL DOSEPAK) 4 MG TBPK tablet Once daily with breakfast. Take 6 tabs for first day then decrease by 1 tab daily until finish. 21 tablet 0   Multiple Minerals-Vitamins (CAL-MAG-ZINC-D PO) Take 1 tablet by mouth 2 (two) times daily.     Multiple Vitamin (MULTIVITAMIN WITH MINERALS) TABS tablet Place 1 tablet into feeding tube in the morning.     mupirocin ointment (BACTROBAN) 2 %      neomycin-polymyxin-hydrocortisone (CORTISPORIN) 3.5-10000-1 OTIC suspension Apply 1-2 drops daily after soaking and cover with bandaid 10 mL 0   Nutritional Supplements (NUTREN 1.5) LIQD Place 237 mLs into feeding tube in the morning and at bedtime.     omeprazole (PRILOSEC) 20 MG capsule TAKE 1 CAPSULE(20 MG) BY MOUTH DAILY 30 capsule 2   ondansetron (ZOFRAN-ODT) 4 MG disintegrating tablet DISSOLVE 1 TO 2 TABLETS(4 TO 8 MG) ON THE TONGUE EVERY 8 HOURS AS NEEDED FOR NAUSEA OR VOMITING 30 tablet 1   Polyethyl Glycol-Propyl Glycol (SYSTANE) 0.4-0.3 % GEL ophthalmic gel Place 1 application into both eyes 2 (two) times daily as needed (dry eyes).     prochlorperazine (COMPAZINE) 10 MG tablet TAKE 1 TABLET(10 MG) BY MOUTH EVERY 6 HOURS AS NEEDED FOR NAUSEA OR VOMITING (Patient taking differently: Take 10 mg by mouth every 6 (six) hours as needed for nausea or vomiting.) 30 tablet 1   No facility-administered medications prior to visit.     EXAM:  BP 130/72 (BP Location: Left Arm, Patient Position: Sitting, Cuff Size: Normal)   Pulse (!) 56   Temp (!) 97.4 F (36.3 C) (Oral)   Ht 5\' 6"  (1.676 m)   Wt 134 lb (60.8 kg)   SpO2 96%    BMI 21.63 kg/m   Body mass index is 21.63 kg/m.  GENERAL: vitals reviewed and listed above, alert, oriented, appears well hydrated and in no acute distress HEENT: atraumatic, conjunctiva  clear, no obvious abnormalities on inspection of external nose and ears OP : no lesion edema or exudate  lower lip dry no cracking or leison  no dsicrete ulcer  right corner  small crack  NECK: no obvious masses on inspection palpation  MS: moves all extremities without noticeable focal  abnormality independent walking  PSYCH: pleasant and cooperative, no obvious depression or anxiety Lab Results  Component Value Date   WBC 5.1 07/13/2022   HGB 14.9 07/13/2022   HCT 45.0 07/13/2022   PLT 160 07/13/2022   GLUCOSE 101 (H) 07/13/2022   CHOL 279 (H) 05/27/2019   TRIG 78.0 05/27/2019   HDL 118.80 05/27/2019   LDLDIRECT 135.9 10/16/2012   LDLCALC 145 (H) 05/27/2019   ALT 17 07/13/2022   AST 22 07/13/2022   NA 143 07/13/2022   K 4.5 07/13/2022   CL 102 07/13/2022   CREATININE 1.00 07/13/2022   BUN 30 (H) 07/13/2022   CO2 38 (H) 07/13/2022   TSH 2.302 04/07/2022  INR 1.1 02/22/2021   HGBA1C 5.2 11/09/2021   BP Readings from Last 3 Encounters:  08/25/22 130/72  07/26/22 (!) 154/98  07/18/22 (!) 163/83    ASSESSMENT AND PLAN:  Discussed the following assessment and plan:  Dysuria - ucx pending  has pyuria on screen will wait on ucx before antibiotic - Plan: POC Urinalysis Dipstick, Urine Culture, Urine Culture  Dermatitis of lip  Dry mouth - prob from prev dx and rx  consider   intervention that could help lip situation  Malignant neoplasm of upper third esophagus - in remission no signs  after rx.  -Patient advised to return or notify health care team  if  new concerns arise.  Patient Instructions  I  think the  sx are related to lip dryness dermatitis   Avoid any chemical that could  sensitize the lips .  No lipstick .... Hydrate multiple times per day with hydrating ointments   Aquafor ointment( ointments work better ) 3-4 x per day or more . Consider seeing dermatologist .    ? Dry mouth  meds? Ask oncologist.  Moisturized air when sleeping  .  Waiting for  urine culture for  possible antibiotic treatment.   [name]. Tahliyah Anagnos M.D.

## 2022-08-25 NOTE — Patient Instructions (Addendum)
I  think the  sx are related to lip dryness dermatitis   Avoid any chemical that could  sensitize the lips .  No lipstick .... Hydrate multiple times per day with hydrating ointments  Aquafor ointment( ointments work better ) 3-4 x per day or more . Consider seeing dermatologist .    ? Dry mouth  meds? Ask oncologist.  Moisturized air when sleeping  .  Waiting for  urine culture for  possible antibiotic treatment.

## 2022-08-27 LAB — URINE CULTURE
MICRO NUMBER:: 14783137
SPECIMEN QUALITY:: ADEQUATE

## 2022-08-28 NOTE — Progress Notes (Signed)
  Subjective:  Patient ID: [name], female    DOB: [birth year],  MRN: [id number]  Chief Complaint  Patient presents with   Ingrown Toenail    right great toenail ingrown    82 y.o. female presents with the above complaint. History confirmed with patient.  The right lateral is doing well, the right medial is now ingrown and causing pain  Objective:  Physical Exam: warm, good capillary refill, no trophic changes or ulcerative lesions, normal DP and PT pulses, normal sensory exam, and right hallux medial border ingrown and painful, left lateral slight discomfort at tip  Assessment:   1. Ingrowing right great toenail      Plan:  Patient was evaluated and treated and all questions answered.    Ingrown Nail, right -Patient elects to proceed with minor surgery to remove ingrown toenail today. Consent reviewed and signed by patient. -Ingrown nail excised. See procedure note. -Educated on post-procedure care including soaking. Written instructions provided and reviewed. -Rx for Cortisporin sent to pharmacy. -Advised on signs and symptoms of infection developing.  We discussed that the phenol likely will create some redness and edema and tenderness around the nailbed as long as it is localized this is to be expected.  Will return as needed if any infection signs develop.  -Left lateral debrided in slant back fashion, return as needed if this worsens for matricectomy  Procedure: Excision of Ingrown Toenail Location: Right 1st toe medial nail borders. Anesthesia: Lidocaine 1% plain; 1.5 mL and Marcaine 0.5% plain; 1.5 mL, digital block. Skin Prep: Betadine. Dressing: Silvadene; telfa; dry, sterile, compression dressing. Technique: Following skin prep, the toe was exsanguinated and a tourniquet was secured at the base of the toe. The affected nail border was freed, split with a nail splitter, and excised. Chemical matrixectomy was then performed with phenol and irrigated out  with alcohol. The tourniquet was then removed and sterile dressing applied. Disposition: Patient tolerated procedure well.    No follow-ups on file.

## 2022-08-29 ENCOUNTER — Encounter: Payer: Self-pay | Admitting: Internal Medicine

## 2022-08-29 ENCOUNTER — Telehealth: Payer: Self-pay

## 2022-08-29 ENCOUNTER — Other Ambulatory Visit: Payer: Self-pay | Admitting: Internal Medicine

## 2022-08-29 MED ORDER — CEPHALEXIN 500 MG PO CAPS: 500.0000 mg | ORAL_CAPSULE | Freq: Three times a day (TID) | ORAL | 0 refills | Status: DC

## 2022-08-29 NOTE — Telephone Encounter (Signed)
Pt [name] stated she has completed her steroid pack x 1wk ago.  Pt stated all her symptoms has mostly cleared up but does c/o of a shallow non-productive cough.  Pt denied taking the guaifenesin-codeine syrup.  Instructed pt to continue taking the guaifenesin-codeine syrup until cough minimize or take Robitussin DM.  Pt denied fever.  Pt stated her lift are a little swollen and dry.  Pt also s/o white sore on inside of lip that come and goes.  Pt denied thrush but stated the issues with her mouth and lips existed prior to starting the steroid pack.  Pt stated she's rinsing her mouth twice daily with Listerine but sore comes and goes.  Informed pt that it sounds like she's experiencing a Canker Sore and instructed pt to purchase an over-the-counter canker sore medication to see if it helps.  Also, instructed pt to contact her dentist or PCP regarding the issues with her lip and canker sore.  Pt verbalized understanding and had no further questions or concerns.  Notified Dr. Mosetta Putt on the pt's call.

## 2022-08-29 NOTE — Progress Notes (Signed)
UTi documented   I will send in antibiotic keflex  500 3 x per day  for 5 days  to treat . Let us know if  symptoms note getting better

## 2022-08-31 NOTE — Telephone Encounter (Signed)
Do not think  medicine the main culprit  suggest  you also ask urology . FYI I sent in antibiotic for this current  UTI

## 2022-09-02 NOTE — Telephone Encounter (Signed)
Spoke to pt and inform her of message from Dr. Fabian Sharp.  Pt states if her burning sensation does not go away, she will contact her urology. Pt picked up the antibiotic. Further action is needed right now.

## 2022-09-06 ENCOUNTER — Encounter: Payer: Self-pay | Admitting: Hematology

## 2022-09-07 ENCOUNTER — Telehealth: Payer: Self-pay

## 2022-09-07 ENCOUNTER — Other Ambulatory Visit: Payer: Self-pay | Admitting: Adult Health

## 2022-09-07 NOTE — Progress Notes (Signed)
   Care Guide Note  09/07/2022 Name: [name] MRN: [id number] DOB: 26-Sep-1940  Referred by: Madelin Headings, MD Reason for referral : Care Coordination (Outreach to transition to [facility] )   [name] is a 82 y.o. year old female who is a primary care patient of [name], Neta Mends, MD. [name] was referred to the pharmacist for assistance related to HTN.    An unsuccessful telephone outreach was attempted today to contact the patient who was referred to the pharmacy team for assistance with medication management. Additional attempts will be made to contact the patient.   Penne Lash, RMA Care Guide [facility]  [place], [place] [address] Direct Dial: [phone] [email or web address]

## 2022-09-19 ENCOUNTER — Telehealth: Payer: Self-pay | Admitting: Adult Health

## 2022-09-19 NOTE — Telephone Encounter (Signed)
LMVM for pt that returned call.  °

## 2022-09-19 NOTE — Telephone Encounter (Signed)
Pt states the Dr that did the [facility] procedure informed her he has done all that he can do for her, pt asking for a call to discuss this further.

## 2022-09-20 NOTE — Progress Notes (Signed)
   Care Guide Note  09/20/2022 Name: [name] MRN: [id number] DOB: [birth year]  Referred by: Madelin Headings, MD Reason for referral : Care Coordination (Outreach to transition to [facility] )   [name] is a 82 y.o. year old female who is a primary care patient of [name], Neta Mends, MD. [name] was referred to the pharmacist for assistance related to HTN.    Successful contact was made with the patient to discuss pharmacy services. Patient declines engagement at this time. Contact information was provided to the patient should they wish to reach out for assistance at a later time.  [name], RMA Care Guide [facility]  [place], [place] [address] Direct Dial: [phone] [email or web address]

## 2022-09-20 NOTE — Telephone Encounter (Signed)
Pt called back.  She has see Dr. Tempie Donning, 09-07-2022 : as per below.  She stated that she has had auditory but now 1 visual hallucination, attributing to lamotrigine (taking 100mg  tabs, 1 am and 2 pm).  Would like to decrease if can.  I would relay to [facility] NP and then call her back when I get response.  She appreciated call.  Assessment  This 82 year old woman underwent Gamma Knife radiosurgery (GKRS) for right trigeminal neuralgia (TN) on Jan 05, 2021. She had previously undergone a right MVD at the [facility] in [place] in 2013, but her pain recurred. Following GKRS, she has had an excellent pain reduction after about 6 months and continues to be pain-free now. She has, however, developed right facial numbness and tingling which she finds somewhat bothersome (but not painful).   Plan  I explained that surgical ablative treatments (e.g., repeat GKRS, percutaneous trigeminal rhizotomy) often bring about the type of sensation changes that she is experiencing. They are an expected effect of ablative treatments, not an unexpected problem. Importantly, additional ablative treatments (e.g., repeat GKRS, percutaneous trigeminal rhizotomy) will only make those sensation changes more intense, potentially unbearable. I therefore recommend no further surgical interventions at this time. I recommend that [name] continue to work with Dr. Lucia Gaskins and her neurology team regarding symptomatic medication management as directed. If [name] TN pain recurs, then she should return for a new evaluation. Otherwise, [name] does not need routine follow up with me at this time.  I have personally spent 25 minutes involved in face-to-face and non-face-to-face activities for this patient on the day of the visit. Professional time spent includes the following activities, in addition to those noted in the documentation: independent review of imaging, assessment, and management plan.  Electronically signed by:  Francina Ames, MD 09/07/2022 10:23 AM

## 2022-09-20 NOTE — Telephone Encounter (Signed)
If she wants to decrease Lamictal she could decrease to 100 mg BID.

## 2022-09-20 NOTE — Telephone Encounter (Signed)
I called pt and let her know that she can decrease her lamotrigine to 100mg  twice a day.  I will mychart message as well.  Please return call if questions.

## 2022-10-11 ENCOUNTER — Other Ambulatory Visit: Payer: Self-pay | Admitting: Family

## 2022-10-11 NOTE — Progress Notes (Unsigned)
[facility]   Telephone:([phone] Fax:([phone]) [phone]   Clinic Follow up Note   Patient Care Team: [name], Neta Mends, MD as PCP - General Jodelle Red, MD as PCP - Cardiology (Cardiology) Theodoro Clock (Neurosurgery) Bjorn Pippin, MD as Attending Physician (Urology) Maeola Harman, MD as Consulting Physician (Neurosurgery) Anson Fret, MD as Consulting Physician (Neurology) Iva Boop, MD as Consulting Physician (Gastroenterology) Jeani Hawking, MD as Consulting Physician (Gastroenterology) Drema Halon, MD (Inactive) as Consulting Physician (Otolaryngology) Malachy Mood, MD as Consulting Physician (Hematology) Dorothy Puffer, MD as Consulting Physician (Radiation Oncology) Dorothy Puffer, MD as Consulting Physician (Radiation Oncology) Sherrill Raring, [facility] (Pharmacist)  Date of Service:  10/12/2022  CHIEF COMPLAINT: f/u of esophageal cancer    CURRENT THERAPY:  Surveillance  ASSESSMENT: [name] is a 82 y.o. 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 EGD 9/15 and CT neck 02/05/21 showed 6 cm mass in the cervical/upper esophagus with possible local adenopathy. 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 Cliffton Asters feels she is not a surgical candidate.  -she received concurrent chemoRT with carbo/taxol 03/08/21 - 04/14/21.  -given PDL1+, she began immunotherapy with Nivolumab on 05/21/21, and completed 1 year therapy in December 2023. -last EGD in November 2023 showed benign stenosis, no evidence of recurrence or residual disease. -restaging CT CAP on 07/27/2022 showed no evidence of residual disease. --She is clinically doing well.  Will continue monitoring.  Plan to repeat scan in July    Cough and dyspnea  -started in Jan 2024, soon after she completed Nivo, mainly dry  cough, with wheezing, worse when she lays down. -her CT chest without contrast showed no evidence of metastatic disease or acute findings. -She was evaluated by cardiologist, no evidence of congestive heart failure -she responded to steroid, resolved now   Dyr mouth and ulcers  -? 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  The base of tongue vallecula and epiglottis were normal.    Piriform sinuses were clear bilaterally and vocal cords were clear with  normal vocal mobility.  No structural abnormalities noted.   01/22/2021 Imaging   DG esophagus IMPRESSION: 1. Luminal narrowing in the high cervical esophagus over a 3 cm segment is highly concerning for esophageal neoplasm. Inflammatory process would be a secondary consideration. Luminal narrowing occurs approximately at the C5-C7 vertebral body level just below the glottis. Patient experienced several episodes of choking related to this luminal narrowing. Recommend expedient upper GI endoscopy for evaluation. 2. Distal thoracic esophagus and GE junction appear normal.   02/04/2021 Procedure   EGD by Dr. Leone Payor impression- One mass-like severe stenosis was found in the upper third of the esophagus. The stenosis was not traversed.   02/05/2021  Imaging   CT soft tissue neck w contrast  IMPRESSION: Mass-like soft tissue thickening and mucosal hyperenhancement of the cervical esophagus and hypopharynx, spanning the C4-T1 levels, likely reflecting an esophageal malignancy. This measures up to 2.4 x 3.4 cm in transaxial dimensions, and 6.3 cm in craniocaudal dimension. Associated severe effacement of the esophageal lumen.   Centrally necrotic right paratracheal lymph node beneath the level of the mass, measuring 1.3 x 0.9 cm, and likely reflecting a site of nodal metastatic disease.   Additional lymph nodes along the posterior and inferior aspect of the right thyroid lobe (along the right aspect of the esophageal mass), which measure subcentimeter but are asymmetrically prominent and highly suspicious for additional sites of nodal metastatic disease.   02/12/2021 Pathology Results   FINAL MICROSCOPIC DIAGNOSIS:  A. ESOPHAGUS, UPPER CERVICAL #1, BIOPSY:  - Squamous cell carcinoma.  B. ESOPHAGUS, UPPER CERVICAL #2, BIOPSY:  - Squamous cell carcinoma.    02/12/2021 Cancer Staging   Staging form: Esophagus - Squamous Cell Carcinoma, AJCC 8th Edition - Clinical stage from 02/12/2021: Stage Unknown (cTX, cN2, cM0) - Signed by Malachy Mood, MD on 03/08/2021 Stage prefix: Initial diagnosis   02/18/2021 Initial Diagnosis   Malignant neoplasm of upper third esophagus (HCC)   02/23/2021 PET scan   IMPRESSION: Markedly hypermetabolic focal masslike thickening of the cervical esophagus, compatible with primary esophageal malignancy.   Markedly hypermetabolic right upper paratracheal lymph node, compatible with metastatic disease.   Small right upper paratracheal and right level IIA lymph nodes with mild hypermetabolic activity, concerning for additional sites of nodal metastatic disease.   Aortic Atherosclerosis (ICD10-I70.0).   03/09/2021 - 04/13/2021 Chemotherapy   Patient is on Treatment Plan : ESOPHAGUS Carboplatin/PACLitaxel weekly x 6 weeks with XRT       05/21/2021 - 01/13/2022  Chemotherapy   Patient is on Treatment Plan : GASTROESOPHAGEAL Nivolumab q14d x 8 cycles / Nivolumab q28d     05/21/2021 -  Chemotherapy   Patient is on Treatment Plan : GASTROESOPHAGEAL Nivolumab (240) q14d x 8 cycles / Nivolumab (480) q28d     07/27/2022 Imaging    IMPRESSION: 1. Stable chest CT. No evidence of metastatic disease or acute findings. 2.  Aortic Atherosclerosis (ICD10-I70.0).      INTERVAL HISTORY:  [name] is here for a follow up of esophageal cancer . She was last seen by me on 07/13/2022. She presents to the clinic accompanied by husband. Pt sate that she is experiencing some dyspnea when she lies down.She has developed mouth sores as well. She has a cough and dry eye. Pt appetite is not the best.    All other systems were reviewed with the patient and are negative.  MEDICAL HISTORY:  Past Medical History:  Diagnosis Date   Abdominal pain 05/29/2013   s/p rx of cephalo resistant e coli   but last rx NG  now residular ?  bladder sx repeat cx sx rx to ty and uro consult    ADJ DISORDER WITH MIXED ANXIETY & DEPRESSED MOOD 03/03/2010   Qualifier: Diagnosis of  By: Fabian Sharp MD, Neta Mends    Agent resistant to multiple antibiotics 05/29/2013   e coli   bu NG on fu.     Anemia    Anxiety    ARF (acute renal failure) (HCC) 03/12/2015   Closed head injury 02/01/2011   from syncope and had scalp laceration  neg ct .     Closed head injury 5-6 yrs ago  Colitis 11/27/2017   Complication of anesthesia    migraine several hours after general anesthesia   Depression    esophageal ca 01/2021   Fatty liver    Gall stones 2016   see ct scan neg HIDA    GERD (gastroesophageal reflux disease)    Hearing aid worn    HOH (hard of hearing)    both ears   Hyperlipidemia    Hypertension    echo nl lv function  mild dilitation 2009   Kidney infection    few yrs ago in hospital   Medication side effect 09/02/2010   Poss muscle se of 10 crestor    Migraine     hypnic HA eval by Dr. 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    Pyelonephritis 03/12/2015   Sensation of pain in anesthetized distribution of trigeminal nerve    Syncope 02/01/2011   In shower on vacation  sustained head laceration  8 sutures Had ed visit neg head ct labs and x ray    Trigeminal neuralgia pain     SURGICAL HISTORY: Past Surgical History:  Procedure Laterality Date   ABDOMINAL HYSTERECTOMY  2002   tubal   BACK SURGERY     2 times, for sciatic nerve pain   BALLOON DILATION  06/21/2021   Procedure: DILATION BALLOON used for esophageal stricture;  Surgeon: Lemar Lofty., MD;  Location: [facility];  Service: Gastroenterology;;   CARDIAC CATHETERIZATION  2000   chest pains neg   CHOLECYSTECTOMY N/A 02/21/2017   Procedure: LAPAROSCOPIC CHOLECYSTECTOMY WITH INTRAOPERATIVE CHOLANGIOGRAM;  Surgeon: Darnell Level, MD;  Location: [facility];  Service: General;  Laterality: N/A;   COLONOSCOPY     multiple   CRANIOTOMY  12/09/2011   nerve decompression right trigeminal    DIRECT LARYNGOSCOPY N/A 02/12/2021   Procedure: DIRECT LARYNGOSCOPY AND BIOPSY POSSIBLE FROZEN;  Surgeon: Drema Halon, MD;  Location: [facility];  Service: ENT;  Laterality: N/A;   DOPPLER ECHOCARDIOGRAPHY  2009   nl lv function mild lv dilitation   ESOPHAGOGASTRODUODENOSCOPY (EGD) WITH PROPOFOL N/A 06/21/2021   Procedure: ESOPHAGOGASTRODUODENOSCOPY (EGD) WITH PROPOFOL;  Surgeon: Lemar Lofty., MD;  Location: [facility];  Service: Gastroenterology;  Laterality: N/A;  Request Fluoroscopy; Plan in for dilation   ESOPHAGOGASTRODUODENOSCOPY (EGD) WITH PROPOFOL N/A 07/19/2021   Procedure: ESOPHAGOGASTRODUODENOSCOPY (EGD) WITH PROPOFOL;  Surgeon: Meridee Score Netty Starring., MD;  Location: [facility];  Service: Endoscopy;  Laterality: N/A;  Dilation fluoro   ESOPHAGOGASTRODUODENOSCOPY (EGD) WITH PROPOFOL N/A 08/05/2021   Procedure:  ESOPHAGOGASTRODUODENOSCOPY (EGD) WITH PROPOFOL - fluoro;  Surgeon: Meridee Score Netty Starring., MD;  Location: [facility];  Service: Gastroenterology;  Laterality: N/A;   ESOPHAGOGASTRODUODENOSCOPY (EGD) WITH PROPOFOL N/A 08/23/2021   Procedure: ESOPHAGOGASTRODUODENOSCOPY (EGD) WITH PROPOFOL;  Surgeon: Iva Boop, MD;  Location: [facility];  Service: Gastroenterology;  Laterality: N/A;  Using Fluoroscopy and esophageal dilation   ESOPHAGOGASTRODUODENOSCOPY (EGD) WITH PROPOFOL N/A 09/22/2021   Procedure: ESOPHAGOGASTRODUODENOSCOPY (EGD) WITH PROPOFOL;  Surgeon: Iva Boop, MD;  Location: [facility];  Service: Gastroenterology;  Laterality: N/A;  EGD with savary and flour   ESOPHAGOGASTRODUODENOSCOPY (EGD) WITH PROPOFOL N/A 12/07/2021   Procedure: ESOPHAGOGASTRODUODENOSCOPY (EGD) WITH PROPOFOL;  Surgeon: Iva Boop, MD;  Location: [facility];  Service: Gastroenterology;  Laterality: N/A;  Schedule this with Fluroscopy, it is a savary dilation   ESOPHAGOGASTRODUODENOSCOPY (EGD) WITH PROPOFOL N/A 04/05/2022   Procedure: ESOPHAGOGASTRODUODENOSCOPY (EGD) WITH PROPOFOL;  Surgeon: Leone Payor,  Maryjean Morn, MD;  Location: [facility];  Service: Gastroenterology;  Laterality: N/A;  EGD /savary dilation with fluoro   EYE SURGERY Bilateral    ioc for catatracts   GASTROSTOMY N/A 02/26/2021   Procedure: OPEN GASTROSTOMY TUBE PLACEMENT;  Surgeon: Darnell Level, MD;  Location: [facility];  Service: General;  Laterality: N/A;   IR GASTROSTOMY TUBE MOD SED  02/22/2021   IR GASTROSTOMY TUBE REMOVAL  02/10/2022   IR REPLACE G-TUBE SIMPLE WO FLUORO  09/07/2021   laparoscopic gallbladder surgery  02/16/2017   Fax from [facility]   OOPHORECTOMY Bilateral 2002   rt shoulder surgery     SAVORY DILATION N/A 06/21/2021   Procedure: SAVORY DILATION;  Surgeon: Lemar Lofty., MD;  Location: [facility];  Service: Gastroenterology;  Laterality: N/A;   SAVORY DILATION N/A 07/19/2021   Procedure: SAVORY  DILATION;  Surgeon: Meridee Score Netty Starring., MD;  Location: [facility];  Service: Endoscopy;  Laterality: N/A;   SAVORY DILATION N/A 08/05/2021   Procedure: SAVORY DILATION;  Surgeon: Meridee Score Netty Starring., MD;  Location[facility];  Service: Gastroenterology;  Laterality: N/A;   SAVORY DILATION N/A 08/23/2021   Procedure: SAVORY DILATION;  Surgeon: Iva Boop, MD;  Location: [facility];  Service: Gastroenterology;  Laterality: N/A;   SAVORY DILATION N/A 09/22/2021   Procedure: SAVORY DILATION;  Surgeon: Iva Boop, MD;  Location: [facility];  Service: Gastroenterology;  Laterality: N/A;   SAVORY DILATION N/A 12/07/2021   Procedure: SAVORY DILATION;  Surgeon: Iva Boop, MD;  Location: [facility];  Service: Gastroenterology;  Laterality: N/A;   SAVORY DILATION N/A 04/05/2022   Procedure: SAVORY DILATION;  Surgeon: Iva Boop, MD;  Location: [facility];  Service: Gastroenterology;  Laterality: N/A;    I have reviewed the social history and family history with the patient and they are unchanged from previous note.  ALLERGIES:  is allergic to bactrim [sulfamethoxazole-trimethoprim], hydrocodone, and sulfa antibiotics.  MEDICATIONS:  Current Outpatient Medications  Medication Sig Dispense Refill   cephALEXin (KEFLEX) 500 MG capsule Take 1 capsule (500 mg total) by mouth 3 (three) times daily. For uti 15 capsule 0   acetaminophen (TYLENOL) 500 MG tablet Take 500-1,000 mg by mouth every 4 (four) hours as needed for headache, mild pain or moderate pain.     albuterol (VENTOLIN HFA) 108 (90 Base) MCG/ACT inhaler Inhale 1-2 puffs into the lungs every 6 (six) hours as needed for wheezing or shortness of breath. 8 g 0   Calcium Carb-Cholecalciferol 600-20 MG-MCG TABS Take by mouth.     carvedilol (COREG) 25 MG tablet TAKE 1 TABLET(25 MG) BY MOUTH TWICE DAILY WITH A MEAL 60 tablet 2   conjugated estrogens (PREMARIN) vaginal cream Place 1 applicator vaginally once a week.      Ferrous Sulfate (IRON PO) Take 1 tablet by mouth daily.     fluticasone (FLONASE) 50 MCG/ACT nasal spray Place 2 sprays into both nostrils as needed for allergies or rhinitis. (Patient taking differently: Place 2 sprays into both nostrils daily as needed for allergies or rhinitis.) 16 g 5   furosemide (LASIX) 20 MG tablet TAKE 1 TABLET(20 MG) BY MOUTH DAILY 90 tablet 0   guaiFENesin-codeine 100-10 MG/5ML syrup PLACE INTO FEEDING TUBE TWICE DAILY FOR COUGH 236 mL 0   lamoTRIgine (LAMICTAL) 100 MG tablet PLACE 2.5 TABLET INTO FEEDING TUBE TWICE DAILY 150 tablet 1   loperamide (IMODIUM) 2 MG capsule 2 mg daily as needed for diarrhea or loose stools.  Per tube     methylPREDNISolone (MEDROL DOSEPAK) 4 MG TBPK tablet Once daily with breakfast. Take 6 tabs for first day then decrease by 1 tab daily until finish. 21 tablet 0   Multiple Minerals-Vitamins (CAL-MAG-ZINC-D PO) Take 1 tablet by mouth 2 (two) times daily.     Multiple Vitamin (MULTIVITAMIN WITH MINERALS) TABS tablet Place 1 tablet into feeding tube in the morning.     mupirocin ointment (BACTROBAN) 2 %      neomycin-polymyxin-hydrocortisone (CORTISPORIN) 3.5-10000-1 OTIC suspension Apply 1-2 drops daily after soaking and cover with bandaid 10 mL 0   Nutritional Supplements (NUTREN 1.5) LIQD Place 237 mLs into feeding tube in the morning and at bedtime.     omeprazole (PRILOSEC) 20 MG capsule TAKE 1 CAPSULE(20 MG) BY MOUTH DAILY 30 capsule 2   ondansetron (ZOFRAN-ODT) 4 MG disintegrating tablet DISSOLVE 1 TO 2 TABLETS(4 TO 8 MG) ON THE TONGUE EVERY 8 HOURS AS NEEDED FOR NAUSEA OR VOMITING 30 tablet 1   Polyethyl Glycol-Propyl Glycol (SYSTANE) 0.4-0.3 % GEL ophthalmic gel Place 1 application into both eyes 2 (two) times daily as needed (dry eyes).     prochlorperazine (COMPAZINE) 10 MG tablet TAKE 1 TABLET(10 MG) BY MOUTH EVERY 6 HOURS AS NEEDED FOR NAUSEA OR VOMITING (Patient taking differently: Take 10 mg by mouth every 6 (six) hours as  needed for nausea or vomiting.) 30 tablet 1   No current facility-administered medications for this visit.    PHYSICAL EXAMINATION: ECOG PERFORMANCE STATUS: 1 - Symptomatic but completely ambulatory  Vitals:   10/12/22 0947  BP: (!) 162/90  Pulse: (!) 52  Resp: 18  Temp: (!) 97.5 F (36.4 C)  SpO2: 95%   Wt Readings from Last 3 Encounters:  10/12/22 132 lb (59.9 kg)  08/25/22 134 lb (60.8 kg)  07/26/22 132 lb 14.4 oz (60.3 kg)     GENERAL:alert, no distress and comfortable SKIN: skin color normal, no rashes or significant lesions EYES: normal, Conjunctiva are pink and non-injected, sclera clear  NEURO: alert & oriented x 3 with fluent speech LUNGS: (-)clear to auscultation and percussion with normal breathing effort HEART: (-) regular rate & rhythm and no murmurs and no lower extremity edema ABDOMEN:(-)abdomen soft, (-)non-tender and (-) normal bowel sounds    LABORATORY DATA:  I have reviewed the data as listed    Latest Ref Rng & Units 07/13/2022   11:37 AM 05/05/2022   12:51 PM 04/07/2022    3:04 PM  CBC  WBC 4.0 - 10.5 K/uL 5.1  4.9  4.6   Hemoglobin 12.0 - 15.0 g/dL 40.9  81.1  91.4   Hematocrit 36.0 - 46.0 % 45.0  45.4  42.6   Platelets 150 - 400 K/uL 160  158  145         Latest Ref Rng & Units 10/12/2022    9:08 AM 07/13/2022   11:37 AM 05/05/2022   12:51 PM  CMP  Glucose 70 - 99 mg/dL 782  956  92   BUN 8 - 23 mg/dL 21  30  29    Creatinine 0.44 - 1.00 mg/dL 2.13  0.86  5.78   Sodium 135 - 145 mmol/L 144  143  141   Potassium 3.5 - 5.1 mmol/L 4.2  4.5  3.9   Chloride 98 - 111 mmol/L 107  102  104   CO2 22 - 32 mmol/L 31  38  34   Calcium 8.9 - 10.3 mg/dL 9.9  46.9  62.9  Total Protein 6.5 - 8.1 g/dL 6.3  6.6  6.2   Total Bilirubin 0.3 - 1.2 mg/dL 0.9  0.8  0.8   Alkaline Phos 38 - 126 U/L 67  73  85   AST 15 - 41 U/L 26  22  26    ALT 0 - 44 U/L 13  17  18        RADIOGRAPHIC STUDIES: I have personally reviewed the radiological images as  listed and agreed with the findings in the report. No results found.    Orders Placed This Encounter  Procedures   CT CHEST ABDOMEN PELVIS W CONTRAST    Standing Status:   Future    Standing Expiration Date:   12/12/2022    Order Specific Question:   If indicated for the ordered procedure, I authorize the administration of contrast media per Radiology protocol    Answer:   Yes    Order Specific Question:   Does the patient have a contrast media/X-ray dye allergy?    Answer:   No    Order Specific Question:   Preferred imaging location?    Answer:   [facility]    Order Specific Question:   If indicated for the ordered procedure, I authorize the administration of oral contrast media per Radiology protocol    Answer:   Yes   All questions were answered. The patient knows to call the clinic with any problems, questions or concerns. No barriers to learning was detected. The total time spent in the appointment was 25 minutes.     Malachy Mood, MD 10/12/2022   Carolin Coy, CMA, am acting as scribe for Malachy Mood, MD.   I have reviewed the above documentation for accuracy and completeness, and I agree with the above.

## 2022-10-11 NOTE — Assessment & Plan Note (Signed)
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 EGD 9/15 and CT neck 02/05/21 showed 6 cm mass in the cervical/upper esophagus with possible local adenopathy. 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 Cliffton Asters feels she is not a surgical candidate.  -she received concurrent chemoRT with carbo/taxol 03/08/21 - 04/14/21.  -given PDL1+, she began immunotherapy with Nivolumab on 05/21/21, and completed 1 year therapy in December 2023. -last EGD in November 2023 showed benign stenosis, no evidence of recurrence or residual disease. -restaging CT CAP on 07/27/2022 showed no evidence of disease. --She is clinically doing well.  Will continue monitoring.  Plan to repeat scan in Sep

## 2022-10-12 ENCOUNTER — Inpatient Hospital Stay (HOSPITAL_BASED_OUTPATIENT_CLINIC_OR_DEPARTMENT_OTHER): Payer: Medicare Other | Admitting: Hematology

## 2022-10-12 ENCOUNTER — Inpatient Hospital Stay: Payer: Medicare Other | Attending: Nurse Practitioner

## 2022-10-12 ENCOUNTER — Encounter: Payer: Self-pay | Admitting: Hematology

## 2022-10-12 ENCOUNTER — Other Ambulatory Visit: Payer: Self-pay

## 2022-10-12 VITALS — BP 162/90 | HR 52 | Temp 97.5°F | Resp 18 | Ht 66.0 in | Wt 132.0 lb

## 2022-10-12 DIAGNOSIS — Z79899 Other long term (current) drug therapy: Secondary | ICD-10-CM

## 2022-10-12 DIAGNOSIS — R0600 Dyspnea, unspecified: Secondary | ICD-10-CM | POA: Insufficient documentation

## 2022-10-12 DIAGNOSIS — Z90710 Acquired absence of both cervix and uterus: Secondary | ICD-10-CM | POA: Diagnosis not present

## 2022-10-12 DIAGNOSIS — R059 Cough, unspecified: Secondary | ICD-10-CM | POA: Insufficient documentation

## 2022-10-12 DIAGNOSIS — Z8501 Personal history of malignant neoplasm of esophagus: Secondary | ICD-10-CM | POA: Insufficient documentation

## 2022-10-12 DIAGNOSIS — C153 Malignant neoplasm of upper third of esophagus: Secondary | ICD-10-CM

## 2022-10-12 LAB — CMP (CANCER CENTER ONLY)
ALT: 13 U/L (ref 0–44)
AST: 26 U/L (ref 15–41)
Albumin: 4 g/dL (ref 3.5–5.0)
Alkaline Phosphatase: 67 U/L (ref 38–126)
Anion gap: 6 (ref 5–15)
BUN: 21 mg/dL (ref 8–23)
CO2: 31 mmol/L (ref 22–32)
Calcium: 9.9 mg/dL (ref 8.9–10.3)
Chloride: 107 mmol/L (ref 98–111)
Creatinine: 1.08 mg/dL — ABNORMAL HIGH (ref 0.44–1.00)
GFR, Estimated: 51 mL/min — ABNORMAL LOW (ref >60)
Glucose, Bld: 117 mg/dL — ABNORMAL HIGH (ref 70–99)
Potassium: 4.2 mmol/L (ref 3.5–5.1)
Sodium: 144 mmol/L (ref 135–145)
Total Bilirubin: 0.9 mg/dL (ref 0.3–1.2)
Total Protein: 6.3 g/dL — ABNORMAL LOW (ref 6.5–8.1)

## 2022-10-20 ENCOUNTER — Encounter (HOSPITAL_BASED_OUTPATIENT_CLINIC_OR_DEPARTMENT_OTHER): Payer: Self-pay

## 2022-10-20 ENCOUNTER — Emergency Department (HOSPITAL_BASED_OUTPATIENT_CLINIC_OR_DEPARTMENT_OTHER): Payer: Medicare Other | Admitting: Radiology

## 2022-10-20 ENCOUNTER — Emergency Department (HOSPITAL_BASED_OUTPATIENT_CLINIC_OR_DEPARTMENT_OTHER)
Admission: EM | Admit: 2022-10-20 | Discharge: 2022-10-20 | Disposition: A | Discharge status: Home / Self Care | Payer: Medicare Other | Attending: Emergency Medicine | Admitting: Emergency Medicine

## 2022-10-20 ENCOUNTER — Other Ambulatory Visit: Payer: Self-pay

## 2022-10-20 DIAGNOSIS — Z23 Encounter for immunization: Secondary | ICD-10-CM | POA: Insufficient documentation

## 2022-10-20 DIAGNOSIS — Z8501 Personal history of malignant neoplasm of esophagus: Secondary | ICD-10-CM | POA: Insufficient documentation

## 2022-10-20 DIAGNOSIS — S90851A Superficial foreign body, right foot, initial encounter: Secondary | ICD-10-CM | POA: Diagnosis not present

## 2022-10-20 DIAGNOSIS — W268XXA Contact with other sharp object(s), not elsewhere classified, initial encounter: Secondary | ICD-10-CM | POA: Diagnosis not present

## 2022-10-20 DIAGNOSIS — Z79899 Other long term (current) drug therapy: Secondary | ICD-10-CM | POA: Insufficient documentation

## 2022-10-20 DIAGNOSIS — Y9301 Activity, walking, marching and hiking: Secondary | ICD-10-CM | POA: Insufficient documentation

## 2022-10-20 DIAGNOSIS — I10 Essential (primary) hypertension: Secondary | ICD-10-CM | POA: Insufficient documentation

## 2022-10-20 DIAGNOSIS — Y92009 Unspecified place in unspecified non-institutional (private) residence as the place of occurrence of the external cause: Secondary | ICD-10-CM | POA: Diagnosis not present

## 2022-10-20 MED ORDER — CEPHALEXIN 500 MG PO CAPS: 500.0000 mg | ORAL_CAPSULE | Freq: Four times a day (QID) | ORAL | 0 refills | Status: DC

## 2022-10-20 MED ORDER — TETANUS-DIPHTH-ACELL PERTUSSIS 5-2.5-18.5 LF-MCG/0.5 IM SUSY
0.5000 mL | PREFILLED_SYRINGE | Freq: Once | INTRAMUSCULAR | Status: AC
  Administered 2022-10-20: 0.5 mL via INTRAMUSCULAR
  Filled 2022-10-20: qty 0.5

## 2022-10-20 MED ORDER — LIDOCAINE HCL (PF) 1 % IJ SOLN
10.0000 mL | Freq: Once | INTRAMUSCULAR | Status: AC
  Administered 2022-10-20: 10 mL
  Filled 2022-10-20: qty 10

## 2022-10-20 NOTE — ED Notes (Signed)
RN reviewed discharge instructions with pt. Pt verbalized understanding and had no further questions. VSS upon discharge.  

## 2022-10-20 NOTE — ED Provider Notes (Signed)
[place] EMERGENCY DEPARTMENT AT [facility] Provider Note   CSN: [id number] Arrival date & time: 10/20/22  1242     History  Chief Complaint  Patient presents with   Foreign Body    [name] is a 82 y.o. female.   Foreign Body   82 year old female presents emergency department complaints of foreign body in right foot.  Patient states that she was walking on her house this morning and stepped on an object which then remained lodged in her foot.  Was seen by her health care center at living facility where they were unable to remove said object prompting visit to the emergency department.  Patient feels as if it is a piece of glass that was on the floor.  Patient unaware of her last tetanus vaccination requesting update.  Past medical history significant for hyperlipidemia, hypertension, GERD, polycythemia, spondylosis, esophageal cancer  Home Medications Prior to Admission medications   Medication Sig Start Date End Date Taking? Authorizing Provider  cephALEXin (KEFLEX) 500 MG capsule Take 1 capsule (500 mg total) by mouth 4 (four) times daily. 10/20/22  Yes Sherian Maroon A, PA  acetaminophen (TYLENOL) 500 MG tablet Take 500-1,000 mg by mouth every 4 (four) hours as needed for headache, mild pain or moderate pain.    [provider]  albuterol (VENTOLIN HFA) 108 (90 Base) MCG/ACT inhaler Inhale 1-2 puffs into the lungs every 6 (six) hours as needed for wheezing or shortness of breath. 07/27/22   [name], Neta Mends, MD  Calcium Carb-Cholecalciferol 600-20 MG-MCG TABS Take by mouth.    [provider]  carvedilol (COREG) 25 MG tablet TAKE 1 TABLET(25 MG) BY MOUTH TWICE DAILY WITH A MEAL 07/11/22   Worthy Rancher B, FNP  conjugated estrogens (PREMARIN) vaginal cream Place 1 applicator vaginally once a week.    [provider]  Ferrous Sulfate (IRON PO) Take 1 tablet by mouth daily.    [provider]  fluticasone (FLONASE) 50  MCG/ACT nasal spray Place 2 sprays into both nostrils as needed for allergies or rhinitis. Patient taking differently: Place 2 sprays into both nostrils daily as needed for allergies or rhinitis. 02/11/20   [name], Neta Mends, MD  furosemide (LASIX) 20 MG tablet TAKE 1 TABLET(20 MG) BY MOUTH DAILY 08/09/22   Worthy Rancher B, FNP  guaiFENesin-codeine 100-10 MG/5ML syrup PLACE INTO FEEDING TUBE TWICE DAILY FOR COUGH 07/26/22   Malachy Mood, MD  lamoTRIgine (LAMICTAL) 100 MG tablet PLACE 2.5 TABLET INTO FEEDING TUBE TWICE DAILY 09/13/22   Butch Penny, NP  loperamide (IMODIUM) 2 MG capsule 2 mg daily as needed for diarrhea or loose stools. Per tube    [provider]  methylPREDNISolone (MEDROL DOSEPAK) 4 MG TBPK tablet Once daily with breakfast. Take 6 tabs for first day then decrease by 1 tab daily until finish. 07/29/22   Malachy Mood, MD  Multiple Minerals-Vitamins (CAL-MAG-ZINC-D PO) Take 1 tablet by mouth 2 (two) times daily.    [provider]  Multiple Vitamin (MULTIVITAMIN WITH MINERALS) TABS tablet Place 1 tablet into feeding tube in the morning.    [provider]  mupirocin ointment (BACTROBAN) 2 %  11/07/21   [provider]  neomycin-polymyxin-hydrocortisone (CORTISPORIN) 3.5-10000-1 OTIC suspension Apply 1-2 drops daily after soaking and cover with bandaid 08/23/22   McDonald, Rachelle Hora, DPM  Nutritional Supplements (NUTREN 1.5) LIQD Place 237 mLs into feeding tube in the morning and at bedtime.    [provider]  omeprazole (PRILOSEC)  20 MG capsule TAKE 1 CAPSULE(20 MG) BY MOUTH DAILY 07/17/22   Worthy Rancher B, FNP  ondansetron (ZOFRAN-ODT) 4 MG disintegrating tablet DISSOLVE 1 TO 2 TABLETS(4 TO 8 MG) ON THE TONGUE EVERY 8 HOURS AS NEEDED FOR NAUSEA OR VOMITING 12/12/21   Malachy Mood, MD  Polyethyl Glycol-Propyl Glycol (SYSTANE) 0.4-0.3 % GEL ophthalmic gel Place 1 application into both eyes 2 (two) times daily as needed (dry eyes).    [provider]   prochlorperazine (COMPAZINE) 10 MG tablet TAKE 1 TABLET(10 MG) BY MOUTH EVERY 6 HOURS AS NEEDED FOR NAUSEA OR VOMITING Patient taking differently: Take 10 mg by mouth every 6 (six) hours as needed for nausea or vomiting. 06/17/21   Malachy Mood, MD      Allergies    Bactrim [sulfamethoxazole-trimethoprim], Hydrocodone, and Sulfa antibiotics    Review of Systems   Review of Systems  All other systems reviewed and are negative.   Physical Exam Updated Vital Signs BP (!) 181/80   Pulse (!) 50   Temp (!) 97.3 F (36.3 C) (Temporal)   Resp 18   Ht 5\' 6"  (1.676 m)   Wt 59 kg   SpO2 95%   BMI 20.98 kg/m  Physical Exam Vitals and nursing note reviewed.  Constitutional:      General: She is not in acute distress.    Appearance: She is well-developed.  HENT:     Head: Normocephalic and atraumatic.  Eyes:     Conjunctiva/sclera: Conjunctivae normal.  Cardiovascular:     Rate and Rhythm: Normal rate and regular rhythm.     Heart sounds: No murmur heard. Pulmonary:     Effort: Pulmonary effort is normal. No respiratory distress.     Breath sounds: Normal breath sounds.  Abdominal:     Palpations: Abdomen is soft.     Tenderness: There is no abdominal tenderness.  Musculoskeletal:        General: No swelling.     Cervical back: Neck supple.     Comments: Patient with 0.5 to 1 cm linear opening noted in right heel without appreciable observable foreign body.  Skin:    General: Skin is warm and dry.     Capillary Refill: Capillary refill takes less than 2 seconds.  Neurological:     Mental Status: She is alert.  Psychiatric:        Mood and Affect: Mood normal.     ED Results / Procedures / Treatments   Labs (all labs ordered are listed, but only abnormal results are displayed) Labs Reviewed - No data to display  EKG None  Radiology DG Foot Complete Right  Result Date: 10/20/2022 CLINICAL DATA:  Stepped on glass.  Pain EXAM: RIGHT FOOT COMPLETE - 3 VIEW COMPARISON:   None Available. FINDINGS: Osteopenia. No fracture or dislocation. Preserved joint spaces. Only mild degenerative changes of the first metatarsophalangeal joint with subchondral cyst formation along the first metatarsal head. Bipartite medial sesamoid bone of the first ray. On the lateral view is a linear radiopaque density seen along the plantar surface of the foot at the level of the calcaneus measuring 4 mm. This could be a radiopaque foreign body. Please correlate with location of laceration. IMPRESSION: 4 mm linear radiopaque foreign body overlying the soft tissues along the plantar surface of the foot at the level of the calcaneus. Please correlate with location of laceration. Osteopenia with degenerative changes. Electronically Signed   By: Karen Kays M.D.   On: 10/20/2022 14:03  Procedures .Foreign Body Removal  Date/Time: 10/20/2022 4:56 PM  Performed by: Peter Garter, PA Authorized by: Peter Garter, PA  Consent: Verbal consent obtained. Consent given by: patient Patient understanding: patient states understanding of the procedure being performed Patient consent: the patient's understanding of the procedure matches consent given Procedure consent: procedure consent matches procedure scheduled Imaging studies: imaging studies available Patient identity confirmed: verbally with patient Time out: Immediately prior to procedure a "time out" was called to verify the correct patient, procedure, equipment, support staff and site/side marked as required. Body area: skin General location: lower extremity Location details: right foot Anesthesia: local infiltration  Anesthesia: Local Anesthetic: lidocaine 2% without epinephrine Anesthetic total: 5 mL  Sedation: Patient sedated: no  Patient restrained: no Patient cooperative: yes Localization method: serial x-rays and ultrasound Removal mechanism: forceps, irrigation and hemostat Dressing: dressing applied Tendon  involvement: none Depth: subcutaneous Complexity: complex 2 objects recovered. Objects recovered: Metallic fragments Post-procedure assessment: residual foreign bodies remain Patient tolerance: patient tolerated the procedure well with no immediate complications Comments: Multiple attempts were made at removing send foreign body or foreign body was brittle and continued to break.  Multiple small pieces of metallic appearing foreign body were removed but with majority of object still in place.      Medications Ordered in ED Medications  lidocaine (PF) (XYLOCAINE) 1 % injection 10 mL (10 mLs Other Given 10/20/22 1348)  Tdap (BOOSTRIX) injection 0.5 mL (0.5 mLs Intramuscular Given 10/20/22 1348)    ED Course/ Medical Decision Making/ A&P                             Medical Decision Making Amount and/or Complexity of Data Reviewed Radiology: ordered.  Risk Prescription drug management.   This patient presents to the ED for concern of foreign body, this involves an extensive number of treatment options, and is a complaint that carries with it a high risk of complications and morbidity.  The differential diagnosis includes foreign body, fracture, dislocation, ligamentous/tendinous injury, neurovascular compromise   Co morbidities that complicate the patient evaluation  See HPI   Additional history obtained:  Additional history obtained from EMR External records from outside source obtained and reviewed including hospital records   Lab Tests:  N/a   Imaging Studies ordered:  I ordered imaging studies including right foot x-ray I independently visualized and interpreted imaging which showed 4 mm linear radiopaque foreign body.  Osteopenia with degenerative changes I agree with the radiologist interpretation   Cardiac Monitoring: / EKG:  The patient was maintained on a cardiac monitor.  I personally viewed and interpreted the cardiac monitored which showed an underlying  rhythm of: Sinus bradycardia   Consultations Obtained:  N/a   Problem List / ED Course / Critical interventions / Medication management  Foreign body in right foot I ordered medication including Tdap, lidocaine   Reevaluation of the patient after these medicines showed that the patient improved I have reviewed the patients home medicines and have made adjustments as needed   Social Determinants of Health:  Denies tobacco, illicit drug use   Test / Admission - Considered:  Foreign body in right foot Vitals signs significant for hypertension. Otherwise within normal range and stable throughout visit. Imaging studies significant for: See above 82 year old female presents emergency department with foreign body in right foot.  Multiple attempts were tried on the emergency department to remove stabbed object without complete removal.  Will  place patient on empiric antibiotics for prophylactic coverage and recommend follow-up with podiatry in the outpatient setting for removal of foreign body.  Amatory referral for podiatry placed while in the emergency department.  Patient overall well-appearing, afebrile in no acute distress.  Protective bandage placed by nursing staff upon patient's discharge.  Treatment plan discussed at length with patient and she acknowledged understanding was agreeable to said plan. Worrisome signs and symptoms were discussed with the patient, and the patient acknowledged understanding to return to the ED if noticed. Patient was stable upon discharge.          Final Clinical Impression(s) / ED Diagnoses Final diagnoses:  Foreign body in right foot, initial encounter    Rx / DC Orders ED Discharge Orders          Ordered    Ambulatory referral to Podiatry       Comments: Foreign body in foot   10/20/22 1520    cephALEXin (KEFLEX) 500 MG capsule  4 times daily        10/20/22 1520              Peter Garter, [place] 10/20/22 1814    Blane Ohara, MD 10/22/22 [phone]

## 2022-10-20 NOTE — Discharge Instructions (Addendum)
As discussed, I have sent in a ambulatory referral to podiatry at the [facility] in [place].  Please call number attached to discharge papers to schedule appointment.  Take antibiotic as directed as prophylaxis against infection.  Take Tylenol/Motrin as a for pain.  Please not hesitate to return to emergency department for worrisome signs and symptoms we discussed become apparent.

## 2022-10-20 NOTE — ED Triage Notes (Signed)
Patient here POV from [name].  Sustained a Shard of Glass to the Bottom Heel of her Right Foot. Unsure of tetanus.   NAD Noted during Triage. A&Ox4. Gcs 15. Ambulatory with Cane.

## 2022-10-20 NOTE — ED Notes (Signed)
Pt assisted to bathroom by this RN at this time

## 2022-10-25 ENCOUNTER — Ambulatory Visit (INDEPENDENT_AMBULATORY_CARE_PROVIDER_SITE_OTHER): Payer: Medicare Other | Admitting: Podiatry

## 2022-10-25 ENCOUNTER — Telehealth: Payer: Self-pay | Admitting: Urology

## 2022-10-25 DIAGNOSIS — S90851A Superficial foreign body, right foot, initial encounter: Secondary | ICD-10-CM

## 2022-10-25 NOTE — Progress Notes (Signed)
  Subjective:  Patient ID: [name], female    DOB: [birth year],  MRN: [id number]  Chief Complaint  Patient presents with   Foreign Body    Est- object in Right foot.    82 y.o. female presents with the above complaint. History confirmed with patient.  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   Radiographs: Multiple views x-ray of the right foot: Films taken in ER 10/20/2022 show radiopaque foreign body in the plantar aspect of the right heel Assessment:   1. Foreign body in right foot, initial encounter      Plan:  Patient was evaluated and treated and all questions answered.  We discussed treatment options, she had the assistance at her living facility attempt to remove it which was unsuccessful, she then went to the ER on 10/20/2022.  They performed a local I&D with local anesthesia and tried to remove it further.  Reports that it may have broken a piece off and they were unable to get it out so referred to [place].  I discussed with her at this point since ID has not been attempted and my review of the images does look like there is fracture of the piece into 2 separate pieces.  I recommended incision and removal of the foreign body under sedation and local anesthesia in the surgery center.  Discussed recovery process.  All questions addressed.  Discussed the risk benefits and potential complications.  I will see her this week for surgery on Friday.  No follow-ups on file.

## 2022-10-25 NOTE — Telephone Encounter (Signed)
DOS - 10/28/22  EXC. FOREIGN BODY RIGHT --- [id number]  [facility] EFFECTIVE DATE 05/23/22  DEDUCTIBLE - $190.00 W/ $0.00 REMAINING OOP - $3,090.00 W/ $2,653.05 REMAINING COINSURANCE - 20%   PER [employer] WEBSITE FOR CPT CODE 16109 HAS BEEN APPROVED, AUTH # [id number], GOOD FROM 10/25/22 - 01/26/23.

## 2022-10-28 ENCOUNTER — Other Ambulatory Visit: Payer: Self-pay | Admitting: Podiatry

## 2022-10-28 DIAGNOSIS — S91321A Laceration with foreign body, right foot, initial encounter: Secondary | ICD-10-CM | POA: Diagnosis not present

## 2022-10-28 MED ORDER — TRAMADOL HCL 50 MG PO TABS: 50.0000 mg | ORAL_TABLET | Freq: Four times a day (QID) | ORAL | 0 refills | Status: AC | PRN

## 2022-10-28 NOTE — Progress Notes (Signed)
10/28/22  removal foreign body

## 2022-11-03 ENCOUNTER — Ambulatory Visit (INDEPENDENT_AMBULATORY_CARE_PROVIDER_SITE_OTHER): Payer: Medicare Other | Admitting: Podiatry

## 2022-11-03 ENCOUNTER — Ambulatory Visit (INDEPENDENT_AMBULATORY_CARE_PROVIDER_SITE_OTHER): Payer: Medicare Other

## 2022-11-03 DIAGNOSIS — S90851A Superficial foreign body, right foot, initial encounter: Secondary | ICD-10-CM

## 2022-11-04 NOTE — Progress Notes (Signed)
  Subjective:  Patient ID: [name], female    DOB: 1941-01-23,  MRN: [id number]  Chief Complaint  Patient presents with   Routine Post Op    POV #1 DOS 10/28/2022 REMOVAL GLASS RT FOOT      82 y.o. female returns for post-op check.  Doing well not having any pain  Review of Systems: Negative except as noted in the HPI. Denies N/V/F/Ch.   Objective:  There were no vitals filed for this visit. There is no height or weight on file to calculate BMI. Constitutional Well developed. Well nourished.  Vascular Foot warm and well perfused. Capillary refill normal to all digits.  Calf is soft and supple, no posterior calf or knee pain, negative Homans' sign  Neurologic Normal speech. Oriented to person, place, and time. Epicritic sensation to light touch grossly present bilaterally.  Dermatologic Skin healing well without signs of infection. Skin edges well coapted without signs of infection.  Orthopedic: She has no pain to palpation   Multiple view plain film radiographs: Interval removal of foreign body Assessment:   1. Foreign body in right foot, initial encounter    Plan:  Patient was evaluated and treated and all questions answered.  S/p foot surgery right -Progressing as expected post-operatively.  Continue WBAT in surgical shoe.  Return in 2 weeks to have sutures removed.  After that may return to regular shoe gear and activity.  May begin showering immediately  Return in about 2 weeks (around 11/17/2022) for post op (no x-rays), suture removal.

## 2022-11-07 ENCOUNTER — Encounter: Payer: Self-pay | Admitting: Neurology

## 2022-11-07 ENCOUNTER — Other Ambulatory Visit: Payer: Self-pay | Admitting: Family

## 2022-11-10 ENCOUNTER — Other Ambulatory Visit: Payer: Self-pay | Admitting: Internal Medicine

## 2022-11-14 NOTE — Telephone Encounter (Signed)
Pt checking on progress of refill request

## 2022-11-17 ENCOUNTER — Ambulatory Visit (INDEPENDENT_AMBULATORY_CARE_PROVIDER_SITE_OTHER): Payer: Medicare Other | Admitting: Podiatry

## 2022-11-17 DIAGNOSIS — Z9889 Other specified postprocedural states: Secondary | ICD-10-CM

## 2022-11-17 DIAGNOSIS — S90851A Superficial foreign body, right foot, initial encounter: Secondary | ICD-10-CM

## 2022-11-17 NOTE — Progress Notes (Signed)
  Subjective:  Patient ID: [name], female    DOB: November 16, 1940,  MRN: [id number]  Chief Complaint  Patient presents with   Routine Post Op    POV #2 DOS 10/28/2022 REMOVAL GLASS RT FOOT/DR MCDONALD PT      82 y.o. female returns for post-op check.  Doing well not having any pain  Review of Systems: Negative except as noted in the HPI. Denies N/V/F/Ch.   Objective:  There were no vitals filed for this visit. There is no height or weight on file to calculate BMI. Constitutional Well developed. Well nourished.  Vascular Foot warm and well perfused. Capillary refill normal to all digits.  Calf is soft and supple, no posterior calf or knee pain, negative Homans' sign  Neurologic Normal speech. Oriented to person, place, and time. Epicritic sensation to light touch grossly present bilaterally.  Dermatologic Skin completely epithelialized.  No signs of Deis is noted.  Wound has healed  Orthopedic: She has no pain to palpation   Multiple view plain film radiographs: Interval removal of foreign body Assessment:   1. Foreign body in right foot, initial encounter   2. Status post foot surgery     Plan:  Patient was evaluated and treated and all questions answered.  S/p foot surgery right -Clinically healed and officially discharged from my care if any foot and ankle issues on future she will come back and see Dr. Abbott Pao.  Stitches were removed. No follow-ups on file.

## 2022-11-29 ENCOUNTER — Telehealth: Payer: Self-pay | Admitting: Internal Medicine

## 2022-11-29 DIAGNOSIS — D229 Melanocytic nevi, unspecified: Secondary | ICD-10-CM

## 2022-11-29 NOTE — Telephone Encounter (Signed)
Pt called to request a referral to see a Dermatologist.   Please advise.

## 2022-11-29 NOTE — Telephone Encounter (Signed)
I spoke with patient. She is wanting a referral to dermatology for a skin check/mole checks. Okay to place?

## 2022-11-30 NOTE — Telephone Encounter (Signed)
Referral placed.

## 2022-11-30 NOTE — Telephone Encounter (Signed)
Yes

## 2022-12-06 ENCOUNTER — Other Ambulatory Visit: Payer: Self-pay

## 2022-12-06 ENCOUNTER — Inpatient Hospital Stay: Payer: Medicare Other | Attending: Nurse Practitioner

## 2022-12-06 ENCOUNTER — Ambulatory Visit (HOSPITAL_COMMUNITY)
Admission: RE | Admit: 2022-12-06 | Discharge: 2022-12-06 | Disposition: A | Discharge status: Home / Self Care | Payer: Medicare Other | Source: Ambulatory Visit | Attending: Hematology | Admitting: Hematology

## 2022-12-06 DIAGNOSIS — Z8501 Personal history of malignant neoplasm of esophagus: Secondary | ICD-10-CM | POA: Insufficient documentation

## 2022-12-06 DIAGNOSIS — C153 Malignant neoplasm of upper third of esophagus: Secondary | ICD-10-CM | POA: Insufficient documentation

## 2022-12-06 LAB — CBC WITH DIFFERENTIAL (CANCER CENTER ONLY)
Abs Immature Granulocytes: 0 10*3/uL (ref 0.00–0.07)
Basophils Absolute: 0 10*3/uL (ref 0.0–0.1)
Basophils Relative: 1 %
Eosinophils Absolute: 0.3 10*3/uL (ref 0.0–0.5)
Eosinophils Relative: 6 %
HCT: 43.1 % (ref 36.0–46.0)
Hemoglobin: 14.2 g/dL (ref 12.0–15.0)
Immature Granulocytes: 0 %
Lymphocytes Relative: 18 %
Lymphs Abs: 0.7 10*3/uL (ref 0.7–4.0)
MCH: 30.2 pg (ref 26.0–34.0)
MCHC: 32.9 g/dL (ref 30.0–36.0)
MCV: 91.7 fL (ref 80.0–100.0)
Monocytes Absolute: 0.3 10*3/uL (ref 0.1–1.0)
Monocytes Relative: 8 %
Neutro Abs: 2.7 10*3/uL (ref 1.7–7.7)
Neutrophils Relative %: 67 %
Platelet Count: 150 10*3/uL (ref 150–400)
RBC: 4.7 MIL/uL (ref 3.87–5.11)
RDW: 13.3 % (ref 11.5–15.5)
WBC Count: 4 10*3/uL (ref 4.0–10.5)
nRBC: 0 % (ref 0.0–0.2)

## 2022-12-06 LAB — CMP (CANCER CENTER ONLY)
ALT: 15 U/L (ref 0–44)
AST: 22 U/L (ref 15–41)
Albumin: 3.8 g/dL (ref 3.5–5.0)
Alkaline Phosphatase: 61 U/L (ref 38–126)
Anion gap: 3 — ABNORMAL LOW (ref 5–15)
BUN: 23 mg/dL (ref 8–23)
CO2: 35 mmol/L — ABNORMAL HIGH (ref 22–32)
Calcium: 9.8 mg/dL (ref 8.9–10.3)
Chloride: 104 mmol/L (ref 98–111)
Creatinine: 1.09 mg/dL — ABNORMAL HIGH (ref 0.44–1.00)
GFR, Estimated: 50 mL/min — ABNORMAL LOW (ref >60)
Glucose, Bld: 110 mg/dL — ABNORMAL HIGH (ref 70–99)
Potassium: 4.3 mmol/L (ref 3.5–5.1)
Sodium: 142 mmol/L (ref 135–145)
Total Bilirubin: 0.8 mg/dL (ref 0.3–1.2)
Total Protein: 6.1 g/dL — ABNORMAL LOW (ref 6.5–8.1)

## 2022-12-06 MED ORDER — IOHEXOL 300 MG/ML  SOLN
100.0000 mL | Freq: Once | INTRAMUSCULAR | Status: AC | PRN
  Administered 2022-12-06: 100 mL via INTRAVENOUS

## 2022-12-08 ENCOUNTER — Other Ambulatory Visit: Payer: Self-pay

## 2022-12-08 ENCOUNTER — Inpatient Hospital Stay: Payer: Medicare Other | Admitting: Hematology

## 2022-12-08 ENCOUNTER — Encounter: Payer: Self-pay | Admitting: Hematology

## 2022-12-08 VITALS — BP 158/82 | HR 58 | Temp 98.1°F | Resp 17 | Ht 66.0 in | Wt 131.0 lb

## 2022-12-08 DIAGNOSIS — Z8501 Personal history of malignant neoplasm of esophagus: Secondary | ICD-10-CM | POA: Diagnosis present

## 2022-12-08 DIAGNOSIS — C153 Malignant neoplasm of upper third of esophagus: Secondary | ICD-10-CM

## 2022-12-08 NOTE — Progress Notes (Signed)
[facility]   [phone] [phone]   Clinic Follow up Note   Patient Care Team: [name], Neta Mends, MD as PCP - General Jodelle Red, MD as PCP - Cardiology (Cardiology) Theodoro Clock (Neurosurgery) Bjorn Pippin, MD as Attending Physician (Urology) Maeola Harman, MD as Consulting Physician (Neurosurgery) Anson Fret, MD as Consulting Physician (Neurology) Iva Boop, MD as Consulting Physician (Gastroenterology) Jeani Hawking, MD as Consulting Physician (Gastroenterology) Drema Halon, MD (Inactive) as Consulting Physician (Otolaryngology) Malachy Mood, MD as Consulting Physician (Hematology) Dorothy Puffer, MD as Consulting Physician (Radiation Oncology) Dorothy Puffer, MD as Consulting Physician (Radiation Oncology) Sherrill Raring, [facility] (Inactive) (Pharmacist)  Date of Service:  12/08/2022  CHIEF COMPLAINT: f/u of  esophageal cancer      CURRENT THERAPY:  Surveillance  ASSESSMENT:  [name] is a 82 y.o. 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 EGD 02/04/21 and CT neck 02/05/21 showed 6 cm mass in the cervical/upper esophagus with possible local adenopathy. 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 Cliffton Asters feels she is not a surgical candidate.  -she received concurrent chemoRT with carbo/taxol 03/08/21 - 04/14/21.  -given PDL1+, she began immunotherapy with Nivolumab on 05/21/21, and completed 1 year therapy in December 2023. -last EGD in November 2023 showed benign stenosis, no evidence of recurrence or residual disease. -I personally reviewed her surveillance CT scan from December 06, 2022, which shows no evidence of recurrence to my eyes, provide her CT scan has not been read by radiologist yet -She is clinically doing well, she is able to  tolerate most food.  She would like to repeat the EGD to see if she needs further dilatation, I sent a message to his GI doctor Dr. Leone Payor      Dyr mouth  -Possible related to immunotherapy or radiation  -I encouraged her to drink liquid frequently.   PLAN: -lab reviewed - I reviewed the CT scan images with the patient together today, and I will let her know when the formal report is back -I will reach out to Dr.Gessner -Repeat CT scan in 1 year -Lab and follow-up in 4 months.  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  The base of tongue vallecula and epiglottis were normal.    Piriform sinuses were clear bilaterally and vocal cords were clear with  normal vocal mobility.  No structural abnormalities noted.   01/22/2021 Imaging   DG esophagus IMPRESSION: 1. Luminal narrowing in the high cervical esophagus over a 3 cm segment is highly concerning for esophageal neoplasm. Inflammatory process would be a secondary consideration. Luminal narrowing occurs approximately at the C5-C7 vertebral body level just below the glottis. Patient experienced several episodes of choking related to this luminal narrowing. Recommend expedient upper GI endoscopy for evaluation. 2. Distal thoracic esophagus and GE junction appear normal.   02/04/2021 Procedure   EGD by Dr. Leone Payor impression-  One mass-like severe stenosis was found in the upper third of the esophagus. The stenosis was not traversed.   02/05/2021 Imaging   CT soft tissue  neck w contrast IMPRESSION: Mass-like soft tissue thickening and mucosal hyperenhancement of the cervical esophagus and hypopharynx, spanning the C4-T1 levels, likely reflecting an esophageal malignancy. This measures up to 2.4 x 3.4 cm in transaxial dimensions, and 6.3 cm in craniocaudal dimension. Associated severe effacement of the esophageal lumen.   Centrally necrotic right paratracheal lymph node beneath the level of the mass, measuring 1.3 x 0.9 cm, and likely reflecting a site of nodal metastatic disease.   Additional lymph nodes along the posterior and inferior aspect of the right thyroid lobe (along the right aspect of the esophageal mass), which measure subcentimeter but are asymmetrically prominent and highly suspicious for additional sites of nodal metastatic disease.   02/12/2021 Pathology Results   FINAL MICROSCOPIC DIAGNOSIS:  A. ESOPHAGUS, UPPER CERVICAL #1, BIOPSY:  - Squamous cell carcinoma.  B. ESOPHAGUS, UPPER CERVICAL #2, BIOPSY:  - Squamous cell carcinoma.    02/12/2021 Cancer Staging   Staging form: Esophagus - Squamous Cell Carcinoma, AJCC 8th Edition - Clinical stage from 02/12/2021: Stage Unknown (cTX, cN2, cM0) - Signed by Malachy Mood, MD on 03/08/2021 Stage prefix: Initial diagnosis   02/18/2021 Initial Diagnosis   Malignant neoplasm of upper third esophagus (HCC)   02/23/2021 PET scan   IMPRESSION: Markedly hypermetabolic focal masslike thickening of the cervical esophagus, compatible with primary esophageal malignancy.   Markedly hypermetabolic right upper paratracheal lymph node, compatible with metastatic disease.   Small right upper paratracheal and right level IIA lymph nodes with mild hypermetabolic activity, concerning for additional sites of nodal metastatic disease.   Aortic Atherosclerosis (ICD10-I70.0).   03/09/2021 - 04/13/2021 Chemotherapy   Patient is on Treatment Plan : ESOPHAGUS Carboplatin/PACLitaxel weekly x 6 weeks with XRT        05/21/2021 - 01/13/2022 Chemotherapy   Patient is on Treatment Plan : GASTROESOPHAGEAL Nivolumab q14d x 8 cycles / Nivolumab q28d     05/21/2021 - 05/05/2022 Chemotherapy   Patient is on Treatment Plan : GASTROESOPHAGEAL Nivolumab (240) q14d x 8 cycles / Nivolumab (480) q28d     07/27/2022 Imaging    IMPRESSION: 1. Stable chest CT. No evidence of metastatic disease or acute findings. 2.  Aortic Atherosclerosis (ICD10-I70.0).      INTERVAL HISTORY:  [name] is here for a follow up of  esophageal cancer . She was last seen by me on 10/12/2022. She presents to the clinic accompanied by husband. Pt state that her appetite is good, but she has cough, dry mouth, and SOB. Pt state that she is able to eat  anything she wants. Pt state that se has mouth sores every now and the.  All other systems were reviewed with the patient and are negative.  MEDICAL HISTORY:  Past Medical History:  Diagnosis Date   Abdominal pain 05/29/2013   s/p rx of cephalo resistant e coli   but last rx NG  now residular ?  bladder sx repeat cx sx rx to ty and uro consult    ADJ DISORDER WITH MIXED ANXIETY & DEPRESSED MOOD 03/03/2010   Qualifier: Diagnosis of  By: Fabian Sharp MD, Neta Mends    Agent resistant to multiple antibiotics 05/29/2013   e coli   bu NG on fu.     Anemia    Anxiety    ARF (acute renal failure) (HCC) 03/12/2015   Closed  head injury 02/01/2011   from syncope and had scalp laceration  neg ct .     Closed head injury 5-6 yrs ago   Colitis 11/27/2017   Complication of anesthesia    migraine several hours after general anesthesia   Depression    esophageal ca 01/2021   Fatty liver    Gall stones 2016   see ct scan neg HIDA    GERD (gastroesophageal reflux disease)    Hearing aid worn    HOH (hard of hearing)    both ears   Hyperlipidemia    Hypertension    echo nl lv function  mild dilitation 2009   Kidney infection    few yrs ago in hospital   Medication side effect  09/02/2010   Poss muscle se of 10 crestor    Migraine    hypnic HA eval by Dr. 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    Pyelonephritis 03/12/2015   Sensation of pain in anesthetized distribution of trigeminal nerve    Syncope 02/01/2011   In shower on vacation  sustained head laceration  8 sutures Had ed visit neg head ct labs and x ray    Trigeminal neuralgia pain     SURGICAL HISTORY: Past Surgical History:  Procedure Laterality Date   ABDOMINAL HYSTERECTOMY  2002   tubal   BACK SURGERY     2 times, for sciatic nerve pain   BALLOON DILATION  06/21/2021   Procedure: DILATION BALLOON used for esophageal stricture;  Surgeon: Lemar Lofty., MD;  Location: [facility];  Service: Gastroenterology;;   CARDIAC CATHETERIZATION  2000   chest pains neg   CHOLECYSTECTOMY N/A 02/21/2017   Procedure: LAPAROSCOPIC CHOLECYSTECTOMY WITH INTRAOPERATIVE CHOLANGIOGRAM;  Surgeon: Darnell Level, MD;  Location: [facility];  Service: General;  Laterality: N/A;   COLONOSCOPY     multiple   CRANIOTOMY  12/09/2011   nerve decompression right trigeminal    DIRECT LARYNGOSCOPY N/A 02/12/2021   Procedure: DIRECT LARYNGOSCOPY AND BIOPSY POSSIBLE FROZEN;  Surgeon: Drema Halon, MD;  Location: [facility];  Service: ENT;  Laterality: N/A;   DOPPLER ECHOCARDIOGRAPHY  2009   nl lv function mild lv dilitation   ESOPHAGOGASTRODUODENOSCOPY (EGD) WITH PROPOFOL N/A 06/21/2021   Procedure: ESOPHAGOGASTRODUODENOSCOPY (EGD) WITH PROPOFOL;  Surgeon: Lemar Lofty., MD;  Location: [facility];  Service: Gastroenterology;  Laterality: N/A;  Request Fluoroscopy; Plan in for dilation   ESOPHAGOGASTRODUODENOSCOPY (EGD) WITH PROPOFOL N/A 07/19/2021   Procedure: ESOPHAGOGASTRODUODENOSCOPY (EGD) WITH PROPOFOL;  Surgeon: Meridee Score Netty Starring., MD;  Location: [facility];  Service: Endoscopy;  Laterality: N/A;  Dilation fluoro   ESOPHAGOGASTRODUODENOSCOPY (EGD)  WITH PROPOFOL N/A 08/05/2021   Procedure: ESOPHAGOGASTRODUODENOSCOPY (EGD) WITH PROPOFOL - fluoro;  Surgeon: Meridee Score Netty Starring., MD;  Location: [facility];  Service: Gastroenterology;  Laterality: N/A;   ESOPHAGOGASTRODUODENOSCOPY (EGD) WITH PROPOFOL N/A 08/23/2021   Procedure: ESOPHAGOGASTRODUODENOSCOPY (EGD) WITH PROPOFOL;  Surgeon: Iva Boop, MD;  Location: [facility];  Service: Gastroenterology;  Laterality: N/A;  Using Fluoroscopy and esophageal dilation   ESOPHAGOGASTRODUODENOSCOPY (EGD) WITH PROPOFOL N/A 09/22/2021   Procedure: ESOPHAGOGASTRODUODENOSCOPY (EGD) WITH PROPOFOL;  Surgeon: Iva Boop, MD;  Location: [facility];  Service: Gastroenterology;  Laterality: N/A;  EGD with savary and flour   ESOPHAGOGASTRODUODENOSCOPY (EGD) WITH PROPOFOL N/A 12/07/2021   Procedure: ESOPHAGOGASTRODUODENOSCOPY (EGD) WITH PROPOFOL;  Surgeon: Iva Boop, MD;  Location: [facility];  Service: Gastroenterology;  Laterality: N/A;  Schedule this with Fluroscopy, it is a savary dilation   ESOPHAGOGASTRODUODENOSCOPY (EGD) WITH PROPOFOL N/A 04/05/2022   Procedure: ESOPHAGOGASTRODUODENOSCOPY (EGD) WITH PROPOFOL;  Surgeon: Iva Boop, MD;  Location: [facility];  Service: Gastroenterology;  Laterality: N/A;  EGD /savary dilation with fluoro   EYE SURGERY Bilateral    ioc for catatracts   GASTROSTOMY N/A 02/26/2021   Procedure: OPEN GASTROSTOMY TUBE PLACEMENT;  Surgeon: Darnell Level, MD;  Location: [facility];  Service: General;  Laterality: N/A;   IR GASTROSTOMY TUBE MOD SED  02/22/2021   IR GASTROSTOMY TUBE REMOVAL  02/10/2022   IR REPLACE G-TUBE SIMPLE WO FLUORO  09/07/2021   laparoscopic gallbladder surgery  02/16/2017   Fax from [facility]   OOPHORECTOMY Bilateral 2002   rt shoulder surgery     SAVORY DILATION N/A 06/21/2021   Procedure: SAVORY DILATION;  Surgeon: Lemar Lofty., MD;  Location: [facility];  Service: Gastroenterology;  Laterality: N/A;   SAVORY  DILATION N/A 07/19/2021   Procedure: SAVORY DILATION;  Surgeon: Meridee Score Netty Starring., MD;  Location: [facility];  Service: Endoscopy;  Laterality: N/A;   SAVORY DILATION N/A 08/05/2021   Procedure: SAVORY DILATION;  Surgeon: Meridee Score Netty Starring., MD;  Location: [facility];  Service: Gastroenterology;  Laterality: N/A;   SAVORY DILATION N/A 08/23/2021   Procedure: SAVORY DILATION;  Surgeon: Iva Boop, MD;  Location: [facility];  Service: Gastroenterology;  Laterality: N/A;   SAVORY DILATION N/A 09/22/2021   Procedure: SAVORY DILATION;  Surgeon: Iva Boop, MD;  Location: [facility];  Service: Gastroenterology;  Laterality: N/A;   SAVORY DILATION N/A 12/07/2021   Procedure: SAVORY DILATION;  Surgeon: Iva Boop, MD;  Location: [facility];  Service: Gastroenterology;  Laterality: N/A;   SAVORY DILATION N/A 04/05/2022   Procedure: SAVORY DILATION;  Surgeon: Iva Boop, MD;  Location: [facility];  Service: Gastroenterology;  Laterality: N/A;    I have reviewed the social history and family history with the patient and they are unchanged from previous note.  ALLERGIES:  is allergic to bactrim [sulfamethoxazole-trimethoprim], hydrocodone, and sulfa antibiotics.  MEDICATIONS:  Current Outpatient Medications  Medication Sig Dispense Refill   acetaminophen (TYLENOL) 500 MG tablet Take 500-1,000 mg by mouth every 4 (four) hours as needed for headache, mild pain or moderate pain.     albuterol (VENTOLIN HFA) 108 (90 Base) MCG/ACT inhaler INHALE 1 TO 2 PUFFS INTO THE LUNGS EVERY 6 HOURS AS NEEDED FOR WHEEZING OR SHORTNESS OF BREATH 6.7 g 0   Calcium Carb-Cholecalciferol 600-20 MG-MCG TABS Take by mouth.     carvedilol (COREG) 25 MG tablet TAKE 1 TABLET(25 MG) BY MOUTH TWICE DAILY WITH A MEAL 60 tablet 2   cephALEXin (KEFLEX) 500 MG capsule Take 1 capsule (500 mg total) by mouth 4 (four) times daily. 20 capsule 0   conjugated estrogens (PREMARIN) vaginal cream Place 1  applicator vaginally once a week.     Ferrous Sulfate (IRON PO) Take 1 tablet by mouth daily.     fluticasone (FLONASE) 50 MCG/ACT nasal spray Place 2 sprays into both nostrils as needed for allergies or rhinitis. (Patient taking differently: Place 2 sprays into both nostrils daily as needed for allergies or rhinitis.) 16 g 5   furosemide (LASIX) 20 MG tablet TAKE 1 TABLET(20 MG) BY MOUTH DAILY 90 tablet 0   guaiFENesin-codeine 100-10 MG/5ML syrup PLACE INTO FEEDING TUBE TWICE DAILY FOR COUGH 236 mL 0   lamoTRIgine (LAMICTAL) 100 MG tablet PLACE 2.5  TABLET INTO FEEDING TUBE TWICE DAILY 150 tablet 1   loperamide (IMODIUM) 2 MG capsule 2 mg daily as needed for diarrhea or loose stools. Per tube     methylPREDNISolone (MEDROL DOSEPAK) 4 MG TBPK tablet Once daily with breakfast. Take 6 tabs for first day then decrease by 1 tab daily until finish. 21 tablet 0   Multiple Minerals-Vitamins (CAL-MAG-ZINC-D PO) Take 1 tablet by mouth 2 (two) times daily.     Multiple Vitamin (MULTIVITAMIN WITH MINERALS) TABS tablet Place 1 tablet into feeding tube in the morning.     mupirocin ointment (BACTROBAN) 2 %      neomycin-polymyxin-hydrocortisone (CORTISPORIN) 3.5-10000-1 OTIC suspension Apply 1-2 drops daily after soaking and cover with bandaid 10 mL 0   Nutritional Supplements (NUTREN 1.5) LIQD Place 237 mLs into feeding tube in the morning and at bedtime.     omeprazole (PRILOSEC) 20 MG capsule TAKE 1 CAPSULE(20 MG) BY MOUTH DAILY 30 capsule 2   ondansetron (ZOFRAN-ODT) 4 MG disintegrating tablet DISSOLVE 1 TO 2 TABLETS(4 TO 8 MG) ON THE TONGUE EVERY 8 HOURS AS NEEDED FOR NAUSEA OR VOMITING 30 tablet 1   Polyethyl Glycol-Propyl Glycol (SYSTANE) 0.4-0.3 % GEL ophthalmic gel Place 1 application into both eyes 2 (two) times daily as needed (dry eyes).     prochlorperazine (COMPAZINE) 10 MG tablet TAKE 1 TABLET(10 MG) BY MOUTH EVERY 6 HOURS AS NEEDED FOR NAUSEA OR VOMITING (Patient taking differently: Take 10 mg  by mouth every 6 (six) hours as needed for nausea or vomiting.) 30 tablet 1   No current facility-administered medications for this visit.    PHYSICAL EXAMINATION: ECOG PERFORMANCE STATUS: 1 - Symptomatic but completely ambulatory  Vitals:   12/08/22 1126 12/08/22 1133  BP:  (!) 158/82  Pulse: (!) 58   Resp: 17   Temp: 98.1 F (36.7 C)   SpO2: 96%    Wt Readings from Last 3 Encounters:  12/08/22 131 lb (59.4 kg)  10/20/22 130 lb (59 kg)  10/12/22 132 lb (59.9 kg)     GENERAL:alert, no distress and comfortable SKIN: skin color normal, no rashes or significant lesions EYES: normal, Conjunctiva are pink and non-injected, sclera clear  NEURO: alert & oriented x 3 with fluent speech  LABORATORY DATA:  I have reviewed the data as listed    Latest Ref Rng & Units 12/06/2022    9:45 AM 07/13/2022   11:37 AM 05/05/2022   12:51 PM  CBC  WBC 4.0 - 10.5 K/uL 4.0  5.1  4.9   Hemoglobin 12.0 - 15.0 g/dL 16.1  09.6  04.5   Hematocrit 36.0 - 46.0 % 43.1  45.0  45.4   Platelets 150 - 400 K/uL 150  160  158         Latest Ref Rng & Units 12/06/2022    9:45 AM 10/12/2022    9:08 AM 07/13/2022   11:37 AM  CMP  Glucose 70 - 99 mg/dL 409  811  914   BUN 8 - 23 mg/dL 23  21  30    Creatinine 0.44 - 1.00 mg/dL 7.82  9.56  2.13   Sodium 135 - 145 mmol/L 142  144  143   Potassium 3.5 - 5.1 mmol/L 4.3  4.2  4.5   Chloride 98 - 111 mmol/L 104  107  102   CO2 22 - 32 mmol/L 35  31  38   Calcium 8.9 - 10.3 mg/dL 9.8  9.9  08.6   Total  Protein 6.5 - 8.1 g/dL 6.1  6.3  6.6   Total Bilirubin 0.3 - 1.2 mg/dL 0.8  0.9  0.8   Alkaline Phos 38 - 126 U/L 61  67  73   AST 15 - 41 U/L 22  26  22    ALT 0 - 44 U/L 15  13  17        RADIOGRAPHIC STUDIES: I have personally reviewed the radiological images as listed and agreed with the findings in the report. No results found.    No orders of the defined types were placed in this encounter.  All questions were answered. The patient knows to call  the clinic with any problems, questions or concerns. No barriers to learning was detected. The total time spent in the appointment was 25 minutes.     Malachy Mood, MD 12/08/2022   Carolin Coy, CMA, am acting as scribe for Malachy Mood, MD.   I have reviewed the above documentation for accuracy and completeness, and I agree with the above.

## 2022-12-10 ENCOUNTER — Other Ambulatory Visit: Payer: Self-pay | Admitting: Family

## 2022-12-11 ENCOUNTER — Other Ambulatory Visit: Payer: Self-pay | Admitting: Family

## 2022-12-17 ENCOUNTER — Other Ambulatory Visit: Payer: Self-pay | Admitting: Adult Health

## 2022-12-20 ENCOUNTER — Other Ambulatory Visit: Payer: Self-pay | Admitting: *Deleted

## 2022-12-20 MED ORDER — LAMOTRIGINE 100 MG PO TABS: ORAL_TABLET | ORAL | 1 refills | Status: DC

## 2022-12-21 ENCOUNTER — Other Ambulatory Visit: Payer: Self-pay

## 2022-12-21 MED ORDER — OMEPRAZOLE 20 MG PO CPDR: 20.0000 mg | DELAYED_RELEASE_CAPSULE | Freq: Every day | ORAL | 2 refills | Status: DC

## 2023-01-03 ENCOUNTER — Encounter: Payer: Self-pay | Admitting: Internal Medicine

## 2023-01-04 ENCOUNTER — Other Ambulatory Visit: Payer: Self-pay | Admitting: Family

## 2023-01-07 ENCOUNTER — Encounter: Payer: Self-pay | Admitting: Internal Medicine

## 2023-01-09 ENCOUNTER — Other Ambulatory Visit: Payer: Self-pay

## 2023-01-10 ENCOUNTER — Other Ambulatory Visit: Payer: Self-pay | Admitting: Internal Medicine

## 2023-01-10 ENCOUNTER — Telehealth: Payer: Self-pay | Admitting: Internal Medicine

## 2023-01-10 ENCOUNTER — Other Ambulatory Visit: Payer: Self-pay | Admitting: Family

## 2023-01-10 MED ORDER — OMEPRAZOLE 40 MG PO CPDR: 40.0000 mg | DELAYED_RELEASE_CAPSULE | Freq: Every day | ORAL | 3 refills | Status: DC

## 2023-01-10 NOTE — Telephone Encounter (Signed)
She has slightly worse dysphagia   Having daytime refluxc - I will increase omeprazole to 40 mg  Having wheezing and dyspnea when lying down at night but no reflux then  "Using a lot of albuterol"  She may need another EGD and dilation but needs assessment of rtesp status and further Tx perhaps - I have advised her to see Dr. Fabian Sharp and keep 10/31 f/u me

## 2023-01-10 NOTE — Telephone Encounter (Signed)
She has appt with me on August 27

## 2023-01-11 ENCOUNTER — Other Ambulatory Visit: Payer: Self-pay

## 2023-01-11 MED ORDER — ALBUTEROL SULFATE HFA 108 (90 BASE) MCG/ACT IN AERS: 1.0000 | INHALATION_SPRAY | Freq: Four times a day (QID) | RESPIRATORY_TRACT | 0 refills | Status: DC | PRN

## 2023-01-17 ENCOUNTER — Ambulatory Visit: Payer: Medicare Other | Admitting: Internal Medicine

## 2023-01-17 ENCOUNTER — Encounter: Payer: Self-pay | Admitting: Internal Medicine

## 2023-01-17 VITALS — BP 140/80 | HR 61 | Temp 98.0°F | Ht 66.0 in | Wt 129.6 lb

## 2023-01-17 DIAGNOSIS — L282 Other prurigo: Secondary | ICD-10-CM

## 2023-01-17 DIAGNOSIS — R0600 Dyspnea, unspecified: Secondary | ICD-10-CM | POA: Diagnosis not present

## 2023-01-17 DIAGNOSIS — L989 Disorder of the skin and subcutaneous tissue, unspecified: Secondary | ICD-10-CM | POA: Diagnosis not present

## 2023-01-17 DIAGNOSIS — I517 Cardiomegaly: Secondary | ICD-10-CM | POA: Diagnosis not present

## 2023-01-17 MED ORDER — TRELEGY ELLIPTA 100-62.5-25 MCG/ACT IN AEPB: 1.0000 | INHALATION_SPRAY | Freq: Every day | RESPIRATORY_TRACT | Status: DC
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
## 2023-01-17 NOTE — Patient Instructions (Addendum)
Will do dermatology referral  Continue   moisturizing  and can add  topical H Cortisone  for itchy areas  short term.   Will keep  albuterol refilled.  Since seems to help  also fu  with dr Reece Agar as planned  Breathing.    Trial trelegy  once a day .  Echocardiogram   to assess if heart Size  is increased .  Your exam is normal today .  WIll order this test via cardiology team  will get  more information.

## 2023-01-18 ENCOUNTER — Ambulatory Visit (INDEPENDENT_AMBULATORY_CARE_PROVIDER_SITE_OTHER): Payer: Medicare Other

## 2023-01-18 DIAGNOSIS — R0600 Dyspnea, unspecified: Secondary | ICD-10-CM

## 2023-01-18 DIAGNOSIS — I517 Cardiomegaly: Secondary | ICD-10-CM

## 2023-01-19 LAB — ECHOCARDIOGRAM COMPLETE
Area-P 1/2: 2.69 cm2
MV M vel: 3.79 m/s
MV Peak grad: 57.5 mmHg
S' Lateral: 1.46 cm

## 2023-01-20 ENCOUNTER — Other Ambulatory Visit (HOSPITAL_BASED_OUTPATIENT_CLINIC_OR_DEPARTMENT_OTHER): Payer: Self-pay

## 2023-01-20 DIAGNOSIS — Z8501 Personal history of malignant neoplasm of esophagus: Secondary | ICD-10-CM

## 2023-01-20 NOTE — Progress Notes (Signed)
Echo shows good  muscle pumping at rest and no valve problems  and not enlarged? reassuring but doesn't give [place]  cause of sx.  I advise we get  consult  from the pulmonary team  Staff Please do referral to pulmonary  for shortness of breath using inhalers  and hx of esophageal cancer.

## 2023-01-31 ENCOUNTER — Encounter: Payer: Self-pay | Admitting: Internal Medicine

## 2023-02-02 ENCOUNTER — Other Ambulatory Visit: Payer: Self-pay | Admitting: Internal Medicine

## 2023-02-02 ENCOUNTER — Other Ambulatory Visit: Payer: Self-pay | Admitting: Hematology

## 2023-02-04 ENCOUNTER — Encounter: Payer: Self-pay | Admitting: Internal Medicine

## 2023-02-06 NOTE — Telephone Encounter (Signed)
Glad is helpful  usually take daily however may be ok to take every other day . I wtill want a consult from pulmonary.  K can you check on the referral sent on August 30 and re send if needed ( tell Lea if there was a problem)  and give her another sample  of trelegy for now until assessed by pulmonary.

## 2023-02-07 NOTE — Telephone Encounter (Signed)
Spoke to pt and reports to her of provider message. Verbalized understanding.  Trelegy's sample at front desk for pt to pick up. Pt is aware.

## 2023-02-09 ENCOUNTER — Other Ambulatory Visit: Payer: Self-pay | Admitting: Internal Medicine

## 2023-02-13 NOTE — Telephone Encounter (Signed)
Referral was placed in wrong WQ. I will be sending it to [employer].

## 2023-02-15 NOTE — Telephone Encounter (Signed)
If  you are really that much better   ok to proceed with the  endoscopy. Without the pulmonary appt .   Stay on the  inhaler  and hopefully  insurance will  cover this  med .  But Dont cancel the pulmonary appt yet   lets wait until after the procedure    make virtual  or in person fu visit with me after  procedure before canceling the pulmonary appt.

## 2023-03-04 ENCOUNTER — Other Ambulatory Visit: Payer: Self-pay | Admitting: Internal Medicine

## 2023-03-23 ENCOUNTER — Ambulatory Visit: Payer: Medicare Other | Admitting: Internal Medicine

## 2023-03-23 ENCOUNTER — Encounter: Payer: Self-pay | Admitting: Internal Medicine

## 2023-03-23 VITALS — BP 140/88 | HR 57 | Ht 66.0 in | Wt 132.2 lb

## 2023-03-23 DIAGNOSIS — R1314 Dysphagia, pharyngoesophageal phase: Secondary | ICD-10-CM

## 2023-03-23 DIAGNOSIS — C159 Malignant neoplasm of esophagus, unspecified: Secondary | ICD-10-CM | POA: Diagnosis not present

## 2023-03-23 DIAGNOSIS — K222 Esophageal obstruction: Secondary | ICD-10-CM | POA: Diagnosis not present

## 2023-03-23 NOTE — Patient Instructions (Addendum)
  VISIT SUMMARY:  During today's visit, we discussed your concerns about your esophageal stricture and swallowing difficulties. You reported significant improvement in your breathing issues since starting Trelegy, and your esophageal cancer remains in remission. We reviewed your current management plan and considered potential future steps to improve your swallowing.  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 You are experiencing difficulty swallowing, requiring thorough chewing and hydration during meals. We discussed the risks and benefits of further dilation to improve swallowing. For now, we will continue with your current management plan and consider further dilation if your symptoms worsen.  -RESPIRATORY ISSUES: Your breathing problems have resolved with the use of Trelegy, a medication that helps manage respiratory conditions. You should continue taking Trelegy as prescribed by Dr. Fabian Sharp.  -ESOPHAGEAL CANCER: Your esophageal cancer is currently in remission, meaning there are no signs of active cancer. We will continue with your current follow-up and surveillance plan as recommended by your oncology team.  INSTRUCTIONS:  Please continue with your current management plan for your esophageal stricture and respiratory issues. If your swallowing difficulties worsen, we may consider further dilation of your esophagus. Continue taking Trelegy as prescribed and follow up with your oncology team for regular surveillance of your esophageal cancer.   I appreciate the opportunity to care for you. Stan Head, MD, [facility]

## 2023-03-23 NOTE — Progress Notes (Signed)
[name] 82 y.o. [birth year] [id number]  Assessment & Plan:   Encounter Diagnoses  Name Primary?   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 No recent weight loss or deterioration in symptoms. Last dilation was on November 14th 2023.  Discussed the risks/benefits of further dilation (potential for damage vs potential for improved swallowing). -Continue current management. -Consider further dilation if symptoms worsen.  Respiratory Issues Resolved with Trelegy as per Dr. Fabian Sharp. -Continue Trelegy as prescribed by Dr. Fabian Sharp.  Esophageal Cancer In remission as per patient's report. -Continue current follow-up and surveillance as per oncology.     Requires intubation and GA at EGD due to proximal stricture  Subjective:   Chief Complaint: dysphagia  Discussed the use of AI scribe software for clinical note transcription with the patient, who gave verbal consent to proceed. HPI 82 yp ww w/ hx proximal SCCA of the esophagus, diagnosed , treated with XRT and chemoTx and has required multiple Savary esophageal dilations under General Anesthesia due to proximal location of stricture.     She presents with concerns regarding her esophageal stricture. She reports a significant improvement in her previously noted breathing problems after starting Trelegy. However, she expresses dissatisfaction with her current swallowing ability, which has been affected by the stricture. She describes needing to chew food thoroughly and requires a lot of moisture or liquid when swallowing. This has led to social embarrassment and a prolonged mealtime. Despite these challenges, she notes that her condition has not deteriorated and is, in fact, the best  it has been since her cancer treatment. She expresses a desire for further dilation of the esophagus to improve swallowing if possible but also acknowledges the potential risks associated with the procedure.       Wt Readings from Last 3 Encounters:  03/23/23 132 lb 4 oz (60 kg)  01/17/23 129 lb 9.6 oz (58.8 kg)  12/08/22 131 lb (59.4 kg)    Last upper endoscopy with esophageal dilation 04/05/2022, maximum dilation 12.8 mm Savary under fluoroscopy.   Last chest abdomen pelvis CT 12/09/2022: IMPRESSION: 1. No acute process or evidence of metastatic disease within the chest, abdomen, or pelvis. 2. Trace pelvic fluid, either new or increased since 04/06/2022. Nonspecific. 3. Possible bladder wall thickening and pericystic edema. Correlate with symptoms of cystitis. 4. Incidental findings, including: Coronary artery atherosclerosis. Aortic Atherosclerosis (ICD10-I70.0).    Allergies  Allergen Reactions   Bactrim [Sulfamethoxazole-Trimethoprim] Hives and Itching   Hydrocodone Other (See Comments)    Rebound headaches   Sulfa Antibiotics Other (See Comments)   Current Meds  Medication Sig   acetaminophen (TYLENOL) 500 MG tablet Take 500-1,000 mg by mouth every 4 (four) hours as needed for headache, mild pain or moderate pain.   albuterol (VENTOLIN HFA) 108 (90 Base) MCG/ACT inhaler INHALE 1 TO 2 PUFFS INTO THE LUNGS EVERY 6 HOURS AS NEEDED FOR WHEEZING OR SHORTNESS OF BREATH   Calcium Carb-Cholecalciferol 600-20 MG-MCG TABS Take by mouth.   carvedilol (COREG) 25 MG tablet TAKE 1 TABLET(25 MG) BY MOUTH TWICE DAILY WITH A MEAL   conjugated estrogens (PREMARIN) vaginal cream Place 1 applicator vaginally once a week.   Ferrous Sulfate (IRON PO) Take 1 tablet by mouth daily.   fluticasone (FLONASE) 50 MCG/ACT nasal spray Place 2 sprays  into both nostrils as needed for allergies or rhinitis. (Patient taking differently: Place 2 sprays into both nostrils daily as needed for allergies or  rhinitis.)   Fluticasone-Umeclidin-Vilant (TRELEGY ELLIPTA) 100-62.5-25 MCG/ACT AEPB Inhale 1 puff into the lungs daily.   furosemide (LASIX) 20 MG tablet TAKE 1 TABLET(20 MG) BY MOUTH DAILY   guaiFENesin-codeine 100-10 MG/5ML syrup PLACE INTO FEEDING TUBE TWICE DAILY FOR COUGH   lamoTRIgine (LAMICTAL) 100 MG tablet PLACE 2.5 TABLET INTO FEEDING TUBE TWICE DAILY   loperamide (IMODIUM) 2 MG capsule 2 mg daily as needed for diarrhea or loose stools. Per tube   Multiple Minerals-Vitamins (CAL-MAG-ZINC-D PO) Take 1 tablet by mouth 2 (two) times daily.   Multiple Vitamin (MULTIVITAMIN WITH MINERALS) TABS tablet Place 1 tablet into feeding tube in the morning.   mupirocin ointment (BACTROBAN) 2 %    Nutritional Supplements (NUTREN 1.5) LIQD Place 237 mLs into feeding tube daily.   omeprazole (PRILOSEC) 40 MG capsule Take 1 capsule (40 mg total) by mouth daily.   ondansetron (ZOFRAN-ODT) 4 MG disintegrating tablet DISSOLVE 1 TO 2 TABLETS(4 TO 8 MG) ON THE TONGUE EVERY 8 HOURS AS NEEDED FOR NAUSEA OR VOMITING   Polyethyl Glycol-Propyl Glycol (SYSTANE) 0.4-0.3 % GEL ophthalmic gel Place 1 application into both eyes 2 (two) times daily as needed (dry eyes).   prochlorperazine (COMPAZINE) 10 MG tablet TAKE 1 TABLET(10 MG) BY MOUTH EVERY 6 HOURS AS NEEDED FOR NAUSEA OR VOMITING   Past Medical History:  Diagnosis Date   Abdominal pain 05/29/2013   s/p rx of cephalo resistant e coli   but last rx NG  now residular ?  bladder sx repeat cx sx rx to ty and uro consult    ADJ DISORDER WITH MIXED ANXIETY & DEPRESSED MOOD 03/03/2010   Qualifier: Diagnosis of  By: Fabian Sharp MD, Neta Mends    Agent resistant to multiple antibiotics 05/29/2013   e coli   bu NG on fu.     Anemia    Anxiety    ARF (acute renal failure) (HCC) 03/12/2015   Closed head injury 02/01/2011   from syncope and had scalp laceration  neg ct .     Closed head injury 5-6 yrs ago   Colitis 11/27/2017   Complication of anesthesia    migraine  several hours after general anesthesia   Depression    esophageal ca 01/2021   Fatty liver    Gall stones 2016   see ct scan neg [facility]    GERD (gastroesophageal reflux disease)    Hearing aid worn    HOH (hard of hearing)    both ears   Hyperlipidemia    Hypertension    echo nl lv function  mild dilitation 2009   Kidney infection    few yrs ago in hospital   Medication side effect 09/02/2010   Poss muscle se of 10 crestor    Migraine    hypnic HA eval by Dr. 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    Pyelonephritis 03/12/2015   Sensation of pain in anesthetized distribution of trigeminal nerve    Syncope 02/01/2011   In shower on vacation  sustained head laceration  8 sutures Had ed visit neg head ct labs and x ray    Trigeminal neuralgia pain    Past Surgical History:  Procedure Laterality Date   ABDOMINAL HYSTERECTOMY  2002   tubal   BACK SURGERY  2 times, for sciatic nerve pain   BALLOON DILATION  06/21/2021   Procedure: DILATION BALLOON used for esophageal stricture;  Surgeon: Lemar Lofty., MD;  Location: [facility];  Service: Gastroenterology;;   CARDIAC CATHETERIZATION  2000   chest pains neg   CHOLECYSTECTOMY N/A 02/21/2017   Procedure: LAPAROSCOPIC CHOLECYSTECTOMY WITH INTRAOPERATIVE CHOLANGIOGRAM;  Surgeon: Darnell Level, MD;  Location: [facility];  Service: General;  Laterality: N/A;   COLONOSCOPY     multiple   CRANIOTOMY  12/09/2011   nerve decompression right trigeminal    DIRECT LARYNGOSCOPY N/A 02/12/2021   Procedure: DIRECT LARYNGOSCOPY AND BIOPSY POSSIBLE FROZEN;  Surgeon: Drema Halon, MD;  Location: [facility];  Service: ENT;  Laterality: N/A;   DOPPLER ECHOCARDIOGRAPHY  2009   nl lv function mild lv dilitation   ESOPHAGOGASTRODUODENOSCOPY (EGD) WITH PROPOFOL N/A 06/21/2021   Procedure: ESOPHAGOGASTRODUODENOSCOPY (EGD) WITH PROPOFOL;  Surgeon: Lemar Lofty., MD;  Location: [facility];  Service: Gastroenterology;  Laterality: N/A;  Request Fluoroscopy; Plan in for dilation   ESOPHAGOGASTRODUODENOSCOPY (EGD) WITH PROPOFOL N/A 07/19/2021   Procedure: ESOPHAGOGASTRODUODENOSCOPY (EGD) WITH PROPOFOL;  Surgeon: Meridee Score Netty Starring., MD;  Location: [facility];  Service: Endoscopy;  Laterality: N/A;  Dilation fluoro   ESOPHAGOGASTRODUODENOSCOPY (EGD) WITH PROPOFOL N/A 08/05/2021   Procedure: ESOPHAGOGASTRODUODENOSCOPY (EGD) WITH PROPOFOL - fluoro;  Surgeon: Meridee Score Netty Starring., MD;  Location: [facility];  Service: Gastroenterology;  Laterality: N/A;   ESOPHAGOGASTRODUODENOSCOPY (EGD) WITH PROPOFOL N/A 08/23/2021   Procedure: ESOPHAGOGASTRODUODENOSCOPY (EGD) WITH PROPOFOL;  Surgeon: Iva Boop, MD;  Location: [facility];  Service: Gastroenterology;  Laterality: N/A;  Using Fluoroscopy and esophageal dilation   ESOPHAGOGASTRODUODENOSCOPY (EGD) WITH PROPOFOL N/A 09/22/2021   Procedure: ESOPHAGOGASTRODUODENOSCOPY (EGD) WITH PROPOFOL;  Surgeon: Iva Boop, MD;  Location: [facility];  Service: Gastroenterology;  Laterality: N/A;  EGD with savary and flour   ESOPHAGOGASTRODUODENOSCOPY (EGD) WITH PROPOFOL N/A 12/07/2021   Procedure: ESOPHAGOGASTRODUODENOSCOPY (EGD) WITH PROPOFOL;  Surgeon: Iva Boop, MD;  Location: [facility];  Service: Gastroenterology;  Laterality: N/A;  Schedule this with Fluroscopy, it is a savary dilation   ESOPHAGOGASTRODUODENOSCOPY (EGD) WITH PROPOFOL N/A 04/05/2022   Procedure: ESOPHAGOGASTRODUODENOSCOPY (EGD) WITH PROPOFOL;  Surgeon: Iva Boop, MD;  Location: [facility];  Service: Gastroenterology;  Laterality: N/A;  EGD /savary dilation with fluoro   EYE SURGERY Bilateral    ioc for catatracts   GASTROSTOMY N/A 02/26/2021   Procedure: OPEN GASTROSTOMY TUBE PLACEMENT;  Surgeon: Darnell Level, MD;  Location: [facility];  Service: General;  Laterality: N/A;   IR GASTROSTOMY TUBE MOD SED  02/22/2021   IR GASTROSTOMY TUBE REMOVAL   02/10/2022   IR REPLACE G-TUBE SIMPLE WO FLUORO  09/07/2021   laparoscopic gallbladder surgery  02/16/2017   Fax from [facility]   OOPHORECTOMY Bilateral 2002   rt shoulder surgery     SAVORY DILATION N/A 06/21/2021   Procedure: SAVORY DILATION;  Surgeon: Lemar Lofty., MD;  Location: [facility];  Service: Gastroenterology;  Laterality: N/A;   SAVORY DILATION N/A 07/19/2021   Procedure: SAVORY DILATION;  Surgeon: Meridee Score Netty Starring., MD;  Location: [facility];  Service: Endoscopy;  Laterality: N/A;   SAVORY DILATION N/A 08/05/2021   Procedure: SAVORY DILATION;  Surgeon: Meridee Score Netty Starring., MD;  Location: [facility];  Service: Gastroenterology;  Laterality: N/A;   SAVORY DILATION N/A 08/23/2021   Procedure: SAVORY DILATION;  Surgeon: Iva Boop, MD;  Location: [facility];  Service: Gastroenterology;  Laterality: N/A;  SAVORY DILATION N/A 09/22/2021   Procedure: SAVORY DILATION;  Surgeon: Iva Boop, MD;  Location: [facility];  Service: Gastroenterology;  Laterality: N/A;   SAVORY DILATION N/A 12/07/2021   Procedure: SAVORY DILATION;  Surgeon: Iva Boop, MD;  Location: [facility];  Service: Gastroenterology;  Laterality: N/A;   SAVORY DILATION N/A 04/05/2022   Procedure: SAVORY DILATION;  Surgeon: Iva Boop, MD;  Location: [facility];  Service: Gastroenterology;  Laterality: N/A;   Social History   Social History Narrative   Married   HH of 2-3 (god daughter)   Pets 2 dogs   Non smoker    Child is a physician   G2P2      Caffeine: 2 cups/day   family history includes Alcohol abuse in her father; Cancer in her mother and paternal aunt; Diabetes in her brother; Hypertension in an other family member; Ovarian cancer in her mother; Seizures in her daughter; Stroke in her father and mother.   Review of Systems As per HPI  Objective:   Physical Exam BP (!) 140/88   Pulse (!) 57   Ht 5\' 6"  (1.676 m)   Wt 132 lb 4 oz (60  kg)   BMI 21.35 kg/m    Data reviewed - oncology and PCP notes and as per HPI

## 2023-03-24 ENCOUNTER — Other Ambulatory Visit: Payer: Self-pay | Admitting: Adult Health

## 2023-03-24 ENCOUNTER — Encounter: Payer: Self-pay | Admitting: Internal Medicine

## 2023-03-27 NOTE — Progress Notes (Addendum)
Patient Care Team: [name], Neta Mends, MD as PCP - General Jodelle Red, MD as PCP - Cardiology (Cardiology) Theodoro Clock (Neurosurgery) Bjorn Pippin, MD as Attending Physician (Urology) Maeola Harman, MD as Consulting Physician (Neurosurgery) Anson Fret, MD as Consulting Physician (Neurology) Iva Boop, MD as Consulting Physician (Gastroenterology) Jeani Hawking, MD as Consulting Physician (Gastroenterology) Drema Halon, MD (Inactive) as Consulting Physician (Otolaryngology) Malachy Mood, MD as Consulting Physician (Hematology) Dorothy Puffer, MD as Consulting Physician (Radiation Oncology) Dorothy Puffer, MD as Consulting Physician (Radiation Oncology) Sherrill Raring, [facility] (Pharmacist)  Clinic Day:  03/28/2023  Referring physician: Madelin Headings, MD  ASSESSMENT & PLAN:   Assessment & Plan: 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 EGD 9/15 and CT neck 02/05/21 showed 6 cm mass in the cervical/upper esophagus with possible local adenopathy. 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 Cliffton Asters felt she was not a surgical candidate.  -she received concurrent chemoRT with carbo/taxol 03/08/21 - 04/14/21.  -given PDL1+, she began immunotherapy with Nivolumab on 05/21/21, and completed 1 year therapy in December 2023. -last EGD in November 2023 showed benign stenosis, no evidence of recurrence or residual disease. -restaging CT CAP on 07/27/2022 showed no evidence of disease. --12/06/2022 Ct Cap showed no evidence of metastatic disease. Recommended repeat CT Cap in 1 year (11/2023). Has been clinically doing well.    Plan:  Labs reviewed  -CBC showing WBC 4.7; Hgb 13.7; Hct 40.5; Plt 165; Anc 3.3 -CMP - K 4.3; glucose 119; BUN 33; Creatinine 1.37; eGFR 38; Ca 10.3; LFTs normal.   Will get CT of her neck with and  without contrast for further evaluation of new palpable mass.  -call patient to review scan results when available.  Labs with follow up in 4 months.  Will follow up sooner as indicated.   The patient understands the plans discussed today and is in agreement with them.  She knows to contact our office if she develops concerns prior to her next appointment.  I provided 25 minutes of face-to-face time during this encounter and > 50% was spent counseling as documented under my assessment and plan.    Carlean Jews, NP  [facility] - A DEPT OF [facility] [address] [place] [address] Dept: [phone] Dept Fax: [phone]   No orders of the defined types were placed in this encounter.     CHIEF COMPLAINT:  CC: esophageal cancer   Current Treatment:  surveillance   INTERVAL HISTORY:  Marciel is here today for repeat clinical assessment. She last saw Dr. Mosetta Putt in 11/2022. CT of chest abdomen and pelvis done 12/06/2022 showed no  evidence of metastatic disease. She has been clinically doing well.  Reports difficulty swallowing and gagging on food if she doesn't have liquid with food or doesn't take time to chew chew carefully. She has noted a palpable lump in rigght side of the lower jaw. Noticed this about  a week ago. Initially was tender. States that it doesn't hurt any longer, but she is still able to feel it. Cannot move it around. She denies fevers or chills. She denies pain. Her appetite is good. Her weight has increased 2 pounds over last 3 months  .  I have reviewed the past medical history, past surgical history, social history and family history  with the patient and they are unchanged from previous note.  ALLERGIES:  is allergic to bactrim [sulfamethoxazole-trimethoprim], hydrocodone, and sulfa antibiotics.  MEDICATIONS:  Current Outpatient Medications  Medication Sig Dispense Refill   acetaminophen  (TYLENOL) 500 MG tablet Take 500-1,000 mg by mouth every 4 (four) hours as needed for headache, mild pain or moderate pain.     albuterol (VENTOLIN HFA) 108 (90 Base) MCG/ACT inhaler INHALE 1 TO 2 PUFFS INTO THE LUNGS EVERY 6 HOURS AS NEEDED FOR WHEEZING OR SHORTNESS OF BREATH 6.7 g 0   Calcium Carb-Cholecalciferol 600-20 MG-MCG TABS Take by mouth.     carvedilol (COREG) 25 MG tablet TAKE 1 TABLET(25 MG) BY MOUTH TWICE DAILY WITH A MEAL 60 tablet 2   conjugated estrogens (PREMARIN) vaginal cream Place 1 applicator vaginally once a week.     Ferrous Sulfate (IRON PO) Take 1 tablet by mouth daily.     fluticasone (FLONASE) 50 MCG/ACT nasal spray Place 2 sprays into both nostrils as needed for allergies or rhinitis. (Patient taking differently: Place 2 sprays into both nostrils daily as needed for allergies or rhinitis.) 16 g 5   Fluticasone-Umeclidin-Vilant (TRELEGY ELLIPTA) 100-62.5-25 MCG/ACT AEPB Inhale 1 puff into the lungs daily.     furosemide (LASIX) 20 MG tablet TAKE 1 TABLET(20 MG) BY MOUTH DAILY 90 tablet 0   guaiFENesin-codeine 100-10 MG/5ML syrup PLACE INTO FEEDING TUBE TWICE DAILY FOR COUGH 236 mL 0   lamoTRIgine (LAMICTAL) 100 MG tablet PLACE 2.5 TABLET INTO FEEDING TUBE TWICE DAILY 150 tablet 1   loperamide (IMODIUM) 2 MG capsule 2 mg daily as needed for diarrhea or loose stools. Per tube     Multiple Minerals-Vitamins (CAL-MAG-ZINC-D PO) Take 1 tablet by mouth 2 (two) times daily.     Multiple Vitamin (MULTIVITAMIN WITH MINERALS) TABS tablet Place 1 tablet into feeding tube in the morning.     mupirocin ointment (BACTROBAN) 2 %      Nutritional Supplements (NUTREN 1.5) LIQD Place 237 mLs into feeding tube daily.     omeprazole (PRILOSEC) 40 MG capsule Take 1 capsule (40 mg total) by mouth daily. 90 capsule 3   ondansetron (ZOFRAN-ODT) 4 MG disintegrating tablet DISSOLVE 1 TO 2 TABLETS(4 TO 8 MG) ON THE TONGUE EVERY 8 HOURS AS NEEDED FOR NAUSEA OR VOMITING 30 tablet 1   Polyethyl  Glycol-Propyl Glycol (SYSTANE) 0.4-0.3 % GEL ophthalmic gel Place 1 application into both eyes 2 (two) times daily as needed (dry eyes).     prochlorperazine (COMPAZINE) 10 MG tablet TAKE 1 TABLET(10 MG) BY MOUTH EVERY 6 HOURS AS NEEDED FOR NAUSEA OR VOMITING 30 tablet 1   No current facility-administered medications for this visit.    HISTORY OF PRESENT ILLNESS:   Oncology History Overview Note   Cancer Staging  Malignant neoplasm of upper third esophagus (HCC) Staging form: Esophagus - Squamous Cell Carcinoma, AJCC 8th Edition - Clinical stage from 02/12/2021: Stage Unknown (cTX, cN2, cM0) - Signed by Malachy Mood, MD on 03/08/2021 Stage prefix: Initial diagnosis     Malignant neoplasm of upper third esophagus (HCC)  12/21/2020 Imaging   Laryngoscopy Comments:    On fiberoptic laryngoscopy through the right nostril the nasopharynx was clear.  The base of tongue vallecula and epiglottis were normal.    Piriform sinuses were clear bilaterally and vocal cords were clear with  normal vocal mobility.  No structural abnormalities noted.   01/22/2021 Imaging   DG esophagus IMPRESSION: 1. Luminal narrowing in the high cervical esophagus  over a 3 cm segment is highly concerning for esophageal neoplasm. Inflammatory process would be a secondary consideration. Luminal narrowing occurs approximately at the C5-C7 vertebral body level just below the glottis. Patient experienced several episodes of choking related to this luminal narrowing. Recommend expedient upper GI endoscopy for evaluation. 2. Distal thoracic esophagus and GE junction appear normal.   02/04/2021 Procedure   EGD by Dr. Leone Payor impression- One mass-like severe stenosis was found in the upper third of the esophagus. The stenosis was not traversed.   02/05/2021 Imaging   CT soft tissue neck w contrast IMPRESSION: Mass-like soft tissue thickening and mucosal hyperenhancement of the cervical esophagus and hypopharynx, spanning the C4-T1  levels, likely reflecting an esophageal malignancy. This measures up to 2.4 x 3.4 cm in transaxial dimensions, and 6.3 cm in craniocaudal dimension. Associated severe effacement of the esophageal lumen.   Centrally necrotic right paratracheal lymph node beneath the level of the mass, measuring 1.3 x 0.9 cm, and likely reflecting a site of nodal metastatic disease.   Additional lymph nodes along the posterior and inferior aspect of the right thyroid lobe (along the right aspect of the esophageal mass), which measure subcentimeter but are asymmetrically prominent and highly suspicious for additional sites of nodal metastatic disease.   02/12/2021 Pathology Results   FINAL MICROSCOPIC DIAGNOSIS:  A. ESOPHAGUS, UPPER CERVICAL #1, BIOPSY:  - Squamous cell carcinoma.  B. ESOPHAGUS, UPPER CERVICAL #2, BIOPSY:  - Squamous cell carcinoma.    02/12/2021 Cancer Staging   Staging form: Esophagus - Squamous Cell Carcinoma, AJCC 8th Edition - Clinical stage from 02/12/2021: Stage Unknown (cTX, cN2, cM0) - Signed by Malachy Mood, MD on 03/08/2021 Stage prefix: Initial diagnosis   02/18/2021 Initial Diagnosis   Malignant neoplasm of upper third esophagus (HCC)   02/23/2021 PET scan   IMPRESSION: Markedly hypermetabolic focal masslike thickening of the cervical esophagus, compatible with primary esophageal malignancy.   Markedly hypermetabolic right upper paratracheal lymph node, compatible with metastatic disease.   Small right upper paratracheal and right level IIA lymph nodes with mild hypermetabolic activity, concerning for additional sites of nodal metastatic disease.   Aortic Atherosclerosis (ICD10-I70.0).   03/09/2021 - 04/13/2021 Chemotherapy   Patient is on Treatment Plan : ESOPHAGUS Carboplatin/PACLitaxel weekly x 6 weeks with XRT       05/21/2021 - 01/13/2022 Chemotherapy   Patient is on Treatment Plan : GASTROESOPHAGEAL Nivolumab q14d x 8 cycles / Nivolumab q28d     05/21/2021 - 05/05/2022  Chemotherapy   Patient is on Treatment Plan : GASTROESOPHAGEAL Nivolumab (240) q14d x 8 cycles / Nivolumab (480) q28d     07/27/2022 Imaging    IMPRESSION: 1. Stable chest CT. No evidence of metastatic disease or acute findings. 2.  Aortic Atherosclerosis (ICD10-I70.0).   12/06/2022 Imaging   CT chest, abdomen, and pelvis with contrast   IMPRESSION: 1. No acute process or evidence of metastatic disease within the chest, abdomen, or pelvis. 2. Trace pelvic fluid, either new or increased since 04/06/2022. Nonspecific. 3. Possible bladder wall thickening and pericystic edema. Correlate with symptoms of cystitis. 4. Incidental findings, including: Coronary artery atherosclerosis. Aortic Atherosclerosis (ICD10-I70.0).       REVIEW OF SYSTEMS:   Constitutional: Denies fevers, chills or abnormal weight loss Eyes: Denies blurriness of vision Ears, nose, mouth, throat, and face: Denies mucositis or sore throat. Has noted a palpable lump in right lower jaw. This was initially tender but not any longer. Still able to feel this. Not able to move it  around.  Respiratory: Denies cough, dyspnea or wheezes Cardiovascular: Denies palpitation, chest discomfort or lower extremity swelling Gastrointestinal:  Denies nausea, heartburn or change in bowel habits Skin: Denies abnormal skin rashes Lymphatics: Denies new lymphadenopathy or easy bruising Neurological:Denies numbness, tingling or new weaknesses Behavioral/Psych: Mood is stable, no new changes  All other systems were reviewed with the patient and are negative.   VITALS:   Today's Vitals   03/28/23 1100 03/28/23 1110  BP:  (!) 152/87  Pulse:  (!) 57  Resp:  17  Temp:  (!) 97.4 F (36.3 C)  TempSrc:  Oral  SpO2:  98%  Weight:  134 lb 3.2 oz (60.9 kg)  PainSc: 0-No pain    Body mass index is 21.66 kg/m.   Wt Readings from Last 3 Encounters:  03/28/23 134 lb 3.2 oz (60.9 kg)  03/23/23 132 lb 4 oz (60 kg)  01/17/23 129 lb 9.6 oz  (58.8 kg)    Body mass index is 21.66 kg/m.  Performance status (ECOG): 1 - Symptomatic but completely ambulatory  PHYSICAL EXAM:   GENERAL:alert, no distress and comfortable SKIN: skin color, texture, turgor are normal, no rashes or significant lesions EYES: normal, Conjunctiva are pink and non-injected, sclera clear OROPHARYNX:no exudate, no erythema and lips, buccal mucosa, and tongue normal  NECK: supple, thyroid normal size, non-tender. There is non tender, firm mass along the right lower jaw. Nonh tender. No redness or warmth. Lump seems to be fixed.  LYMPH:  no palpable lymphadenopathy in the cervical, axillary or inguinal LUNGS: clear to auscultation and percussion with normal breathing effort HEART: regular rate & rhythm and no murmurs and no lower extremity edema ABDOMEN:abdomen soft, non-tender and normal bowel sounds Musculoskeletal:no cyanosis of digits and no clubbing  NEURO: alert & oriented x 3 with fluent speech, no focal motor/sensory deficits  LABORATORY DATA:  I have reviewed the data as listed    Component Value Date/Time   NA 140 03/28/2023 1037   NA 136 03/24/2021 1508   K 4.3 03/28/2023 1037   CL 104 03/28/2023 1037   CO2 31 03/28/2023 1037   GLUCOSE 119 (H) 03/28/2023 1037   GLUCOSE 97 03/17/2006 0820   BUN 33 (H) 03/28/2023 1037   BUN 26 03/24/2021 1508   CREATININE 1.37 (H) 03/28/2023 1037   CALCIUM 10.3 03/28/2023 1037   PROT 6.5 03/28/2023 1037   PROT 6.2 03/24/2021 1508   ALBUMIN 3.9 03/28/2023 1037   ALBUMIN 4.2 03/24/2021 1508   AST 20 03/28/2023 1037   ALT 17 03/28/2023 1037   ALKPHOS 80 03/28/2023 1037   BILITOT 0.8 03/28/2023 1037   GFRNONAA 38 (L) 03/28/2023 1037   GFRAA 62 07/08/2020 1653    Lab Results  Component Value Date   WBC 4.7 03/28/2023   NEUTROABS 3.3 03/28/2023   HGB 13.7 03/28/2023   HCT 40.5 03/28/2023   MCV 88.6 03/28/2023   PLT 165 03/28/2023   Addendum I have seen the patient, examined her. I agree with the  assessment and and plan and have edited the notes.   Patient is clinically doing well, exam showed a bilateral submandibular adenopathy, more on the right, no recent teeth infection or oral discomfort.  Will obtain a CT neck with contrast for further evaluation.  Otherwise no clinical concern for cancer progression.  Continue surveillance.  Will call her with CT scan results.  I spent follow-up 25 minutes for her visit today, more than 50% on face-to-face counseling.  Malachy Mood MD 03/28/2023

## 2023-03-27 NOTE — Telephone Encounter (Signed)
What does is she taking now?  Was reducing slowly.

## 2023-03-27 NOTE — Assessment & Plan Note (Addendum)
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 EGD 9/15 and CT neck 02/05/21 showed 6 cm mass in the cervical/upper esophagus with possible local adenopathy. 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 Cliffton Asters felt she was not a surgical candidate.  -she received concurrent chemoRT with carbo/taxol 03/08/21 - 04/14/21.  -given PDL1+, she began immunotherapy with Nivolumab on 05/21/21, and completed 1 year therapy in December 2023. -last EGD in November 2023 showed benign stenosis, no evidence of recurrence or residual disease. -restaging CT CAP on 07/27/2022 showed no evidence of disease. --12/06/2022 Ct Cap showed no evidence of metastatic disease. Recommended repeat CT Cap in 1 year. Has been clinically doing well.

## 2023-03-28 ENCOUNTER — Inpatient Hospital Stay: Payer: Medicare Other | Attending: Nurse Practitioner

## 2023-03-28 ENCOUNTER — Other Ambulatory Visit: Payer: Medicare Other

## 2023-03-28 ENCOUNTER — Inpatient Hospital Stay (HOSPITAL_BASED_OUTPATIENT_CLINIC_OR_DEPARTMENT_OTHER): Payer: Medicare Other | Admitting: Nurse Practitioner

## 2023-03-28 VITALS — BP 152/87 | HR 57 | Temp 97.4°F | Resp 17 | Wt 134.2 lb

## 2023-03-28 DIAGNOSIS — C153 Malignant neoplasm of upper third of esophagus: Secondary | ICD-10-CM | POA: Diagnosis not present

## 2023-03-28 DIAGNOSIS — R229 Localized swelling, mass and lump, unspecified: Secondary | ICD-10-CM

## 2023-03-28 DIAGNOSIS — Z8501 Personal history of malignant neoplasm of esophagus: Secondary | ICD-10-CM | POA: Insufficient documentation

## 2023-03-28 DIAGNOSIS — Z9221 Personal history of antineoplastic chemotherapy: Secondary | ICD-10-CM | POA: Insufficient documentation

## 2023-03-28 LAB — CMP (CANCER CENTER ONLY)
ALT: 17 U/L (ref 0–44)
AST: 20 U/L (ref 15–41)
Albumin: 3.9 g/dL (ref 3.5–5.0)
Alkaline Phosphatase: 80 U/L (ref 38–126)
Anion gap: 5 (ref 5–15)
BUN: 33 mg/dL — ABNORMAL HIGH (ref 8–23)
CO2: 31 mmol/L (ref 22–32)
Calcium: 10.3 mg/dL (ref 8.9–10.3)
Chloride: 104 mmol/L (ref 98–111)
Creatinine: 1.37 mg/dL — ABNORMAL HIGH (ref 0.44–1.00)
GFR, Estimated: 38 mL/min — ABNORMAL LOW (ref >60)
Glucose, Bld: 119 mg/dL — ABNORMAL HIGH (ref 70–99)
Potassium: 4.3 mmol/L (ref 3.5–5.1)
Sodium: 140 mmol/L (ref 135–145)
Total Bilirubin: 0.8 mg/dL (ref <1.2)
Total Protein: 6.5 g/dL (ref 6.5–8.1)

## 2023-03-28 LAB — CBC WITH DIFFERENTIAL (CANCER CENTER ONLY)
Abs Immature Granulocytes: 0.01 10*3/uL (ref 0.00–0.07)
Basophils Absolute: 0 10*3/uL (ref 0.0–0.1)
Basophils Relative: 1 %
Eosinophils Absolute: 0.2 10*3/uL (ref 0.0–0.5)
Eosinophils Relative: 5 %
HCT: 40.5 % (ref 36.0–46.0)
Hemoglobin: 13.7 g/dL (ref 12.0–15.0)
Immature Granulocytes: 0 %
Lymphocytes Relative: 14 %
Lymphs Abs: 0.7 10*3/uL (ref 0.7–4.0)
MCH: 30 pg (ref 26.0–34.0)
MCHC: 33.8 g/dL (ref 30.0–36.0)
MCV: 88.6 fL (ref 80.0–100.0)
Monocytes Absolute: 0.4 10*3/uL (ref 0.1–1.0)
Monocytes Relative: 8 %
Neutro Abs: 3.3 10*3/uL (ref 1.7–7.7)
Neutrophils Relative %: 72 %
Platelet Count: 165 10*3/uL (ref 150–400)
RBC: 4.57 MIL/uL (ref 3.87–5.11)
RDW: 13.4 % (ref 11.5–15.5)
WBC Count: 4.7 10*3/uL (ref 4.0–10.5)
nRBC: 0 % (ref 0.0–0.2)

## 2023-03-28 NOTE — Progress Notes (Signed)
03/28/2023 Name: [name] MRN: [id number] DOB: 1941/04/19  Chief Complaint  Patient presents with   Hypertension   Medication Management    [name] is a 82 y.o. year old female who presented for a telephone visit.   They were referred to the pharmacist by their PCP for assistance in managing hypertension.    Subjective:  Care Team: Primary Care Provider: Madelin Headings, MD   Medication Access/Adherence  Current Pharmacy:  [facility] [phone] [name], [place] - [address] AT [facility] [address] [place] [address] Phone: [phone] Fax: [phone]   Patient reports affordability concerns with their medications: No  Patient reports access/transportation concerns to their pharmacy: No  Patient reports adherence concerns with their medications:  No     Hypertension:  Current medications: Carvedilol 25mg  BID, Lasix 20mg  daily Medications previously tried: Amlodipine, Benazepril, Chlorthalidone, Clonidine, Metoprolol, Triamterene, HCTZ  Patient has a validated, automated, upper arm home BP cuff Current blood pressure readings readings: Has not been checking  Patient denies hypotensive s/sx including dizziness, lightheadedness.  Patient denies hypertensive symptoms including headache, chest pain, shortness of breath   Current physical activity: Goes to the gym MWF   Objective:  Lab Results  Component Value Date   HGBA1C 5.2 11/09/2021    Lab Results  Component Value Date   CREATININE 1.37 (H) 03/28/2023   BUN 33 (H) 03/28/2023   NA 140 03/28/2023   K 4.3 03/28/2023   CL 104 03/28/2023   CO2 31 03/28/2023    Lab Results  Component Value Date   CHOL 279 (H) 05/27/2019   HDL 118.80 05/27/2019   LDLCALC 145 (H) 05/27/2019   LDLDIRECT 135.9 10/16/2012   TRIG 78.0 05/27/2019   CHOLHDL 2 05/27/2019    Medications Reviewed Today     Reviewed by Sherrill Raring, RPH  (Pharmacist) on 03/28/23 at 1534  Med List Status: <None>   Medication Order Taking? Sig Documenting Provider Last Dose Status Informant  acetaminophen (TYLENOL) 500 MG tablet [id number] Yes Take 500-1,000 mg by mouth every 4 (four) hours as needed for headache, mild pain or moderate pain. [provider] Taking Active   albuterol (VENTOLIN HFA) 108 (90 Base) MCG/ACT inhaler [id number] Yes INHALE 1 TO 2 PUFFS INTO THE LUNGS EVERY 6 HOURS AS NEEDED FOR WHEEZING OR SHORTNESS OF BREATH [name], Neta Mends, MD Taking Active   Calcium Carb-Cholecalciferol 600-20 MG-MCG TABS [id number] Yes Take by mouth. [provider] Taking Active   carvedilol (COREG) 25 MG tablet [id number] Yes TAKE 1 TABLET(25 MG) BY MOUTH TWICE DAILY WITH A MEAL [name], Neta Mends, MD Taking Active   conjugated estrogens (PREMARIN) vaginal cream [id number] Yes Place 1 applicator vaginally once a week. [provider] Taking Active Spouse/Significant Other  fluticasone (FLONASE) 50 MCG/ACT nasal spray [id number] Yes Place 2 sprays into both nostrils as needed for allergies or rhinitis.  Patient taking differently: Place 2 sprays into both nostrils daily as needed for allergies or rhinitis.   [name], Neta Mends, MD Taking Active Spouse/Significant Other           Med Note Lenoria Farrier   Fri Apr 01, 2022 12:03 PM)    Fluticasone-Umeclidin-Vilant (TRELEGY ELLIPTA) 100-62.5-25 MCG/ACT AEPB [id number] No Inhale 1 puff into the lungs daily.  Patient not taking: Reported on 03/28/2023   Madelin Headings, MD Not Taking Active   furosemide (LASIX) 20 MG tablet [id number] Yes TAKE 1 TABLET(20  MG) BY MOUTH DAILY [name], Neta Mends, MD Taking Active   guaiFENesin-codeine 100-10 MG/5ML syrup [id number] Yes PLACE INTO FEEDING TUBE TWICE DAILY FOR COUGH Malachy Mood, MD Taking Active   lamoTRIgine (LAMICTAL) 100 MG tablet [id number] Yes PLACE 2.5 TABLET INTO FEEDING TUBE TWICE DAILY Butch Penny, NP Taking Active   loperamide  (IMODIUM) 2 MG capsule [id number] Yes 2 mg daily as needed for diarrhea or loose stools. Per tube [provider] Taking Active Spouse/Significant Other  Multiple Minerals-Vitamins (CAL-MAG-ZINC-D PO) [id number]  Take 1 tablet by mouth 2 (two) times daily. [provider]  Active Spouse/Significant Other  Multiple Vitamin (MULTIVITAMIN WITH MINERALS) TABS tablet [id number] Yes Place 1 tablet into feeding tube in the morning. [provider] Taking Active Spouse/Significant Other  mupirocin ointment (BACTROBAN) 2 % [id number]   [provider]  Active   Nutritional Supplements (NUTREN 1.5) LIQD [id number] Yes Place 237 mLs into feeding tube daily. [provider] Taking Active Spouse/Significant Other  omeprazole (PRILOSEC) 40 MG capsule [id number] Yes Take 1 capsule (40 mg total) by mouth daily. Iva Boop, MD Taking Active   ondansetron (ZOFRAN-ODT) 4 MG disintegrating tablet [id number] Yes DISSOLVE 1 TO 2 TABLETS(4 TO 8 MG) ON THE TONGUE EVERY 8 HOURS AS NEEDED FOR NAUSEA OR VOMITING Malachy Mood, MD Taking Active   Polyethyl Glycol-Propyl Glycol (SYSTANE) 0.4-0.3 % GEL ophthalmic gel [id number] Yes Place 1 application into both eyes 2 (two) times daily as needed (dry eyes). [provider] Taking Active Spouse/Significant Other  prochlorperazine (COMPAZINE) 10 MG tablet [id number]  TAKE 1 TABLET(10 MG) BY MOUTH EVERY 6 HOURS AS NEEDED FOR NAUSEA OR VOMITING Malachy Mood, MD  Active Spouse/Significant Other              Assessment/Plan:   Hypertension: - Currently uncontrolled - Reviewed long term cardiovascular and renal outcomes of uncontrolled blood pressure - Reviewed appropriate blood pressure monitoring technique and reviewed goal blood pressure. Recommended to check home blood pressure and heart rate 2-3x/week - Recommend to continue current medication therapy     Follow Up Plan: 1 week  Sherrill Raring, PharmD Clinical  Pharmacist [phone]

## 2023-03-29 NOTE — Telephone Encounter (Signed)
Called and scheduled 4 month  appointments with patient.

## 2023-03-29 NOTE — Telephone Encounter (Signed)
Called and schedule patient for visit. Date and time of appointment are confirmed.

## 2023-03-29 NOTE — Telephone Encounter (Signed)
Yes thanks 

## 2023-03-29 NOTE — Telephone Encounter (Signed)
Can you call and confirm that she reduce her dose to 100 mg AM and PM

## 2023-04-01 ENCOUNTER — Other Ambulatory Visit: Payer: Self-pay | Admitting: Internal Medicine

## 2023-04-04 ENCOUNTER — Other Ambulatory Visit: Payer: Medicare Other

## 2023-04-17 ENCOUNTER — Other Ambulatory Visit: Payer: Medicare Other

## 2023-04-17 NOTE — Progress Notes (Signed)
04/17/2023 Name: [name] MRN: [id number] DOB: 06/16/1940  Chief Complaint  Patient presents with   Hypertension    [name] is a 82 y.o. year old female who presented for a telephone visit.   They were referred to the pharmacist by their PCP for assistance in managing hypertension.    Subjective:  Care Team: Primary Care Provider: Madelin Headings, MD   Medication Access/Adherence  Current Pharmacy:  [facility] [phone] Ginette Otto, [place] - [address] AT [facility] OF [address] [place] [address] Phone: [phone] Fax: [phone]   Patient reports affordability concerns with their medications: No  Patient reports access/transportation concerns to their pharmacy: No  Patient reports adherence concerns with their medications:  No     Hypertension:  Current medications: Carvedilol 25mg  BID, Lasix 20mg  daily Medications previously tried: Amlodipine, Benazepril, Chlorthalidone, Clonidine, Metoprolol, Triamterene, HCTZ  Patient has a validated, automated, upper arm home BP cuff Current blood pressure readings readings: Checked a couple of times, did not keep a log, thinks the most recent reading was 147/85 Patient denies hypotensive s/sx including dizziness, lightheadedness.  Patient denies hypertensive symptoms including headache, chest pain, shortness of breath   Current physical activity: Goes to the gym MWF   Objective:  Lab Results  Component Value Date   HGBA1C 5.2 11/09/2021    Lab Results  Component Value Date   CREATININE 1.37 (H) 03/28/2023   BUN 33 (H) 03/28/2023   NA 140 03/28/2023   K 4.3 03/28/2023   CL 104 03/28/2023   CO2 31 03/28/2023    Lab Results  Component Value Date   CHOL 279 (H) 05/27/2019   HDL 118.80 05/27/2019   LDLCALC 145 (H) 05/27/2019   LDLDIRECT 135.9 10/16/2012   TRIG 78.0 05/27/2019   CHOLHDL 2 05/27/2019    Medications Reviewed Today      Reviewed by Sherrill Raring, RPH (Pharmacist) on 04/17/23 at 1110  Med List Status: <None>   Medication Order Taking? Sig Documenting Provider Last Dose Status Informant  acetaminophen (TYLENOL) 500 MG tablet [id number] No Take 500-1,000 mg by mouth every 4 (four) hours as needed for headache, mild pain or moderate pain. [provider] Taking Active   albuterol (VENTOLIN HFA) 108 (90 Base) MCG/ACT inhaler [id number] No INHALE 1 TO 2 PUFFS INTO THE LUNGS EVERY 6 HOURS AS NEEDED FOR WHEEZING OR SHORTNESS OF BREATH [name], Neta Mends, MD Taking Active   Calcium Carb-Cholecalciferol 600-20 MG-MCG TABS [id number] No Take by mouth. [provider] Taking Active   carvedilol (COREG) 25 MG tablet [id number]  TAKE 1 TABLET(25 MG) BY MOUTH TWICE DAILY WITH A MEAL Worthy Rancher B, FNP  Active   conjugated estrogens (PREMARIN) vaginal cream [id number] No Place 1 applicator vaginally once a week. [provider] Taking Active Spouse/Significant Other  fluticasone (FLONASE) 50 MCG/ACT nasal spray [id number] No Place 2 sprays into both nostrils as needed for allergies or rhinitis.  Patient taking differently: Place 2 sprays into both nostrils daily as needed for allergies or rhinitis.   [name], Neta Mends, MD Taking Active Spouse/Significant Other           Med Note Lenoria Farrier   Fri Apr 01, 2022 12:03 PM)    Fluticasone-Umeclidin-Vilant (TRELEGY ELLIPTA) 100-62.5-25 MCG/ACT AEPB [id number] No Inhale 1 puff into the lungs daily.  Patient not taking: Reported on 03/28/2023   [name], Neta Mends, MD Not Taking Active   furosemide (LASIX)  20 MG tablet [id number] No TAKE 1 TABLET(20 MG) BY MOUTH DAILY [name], Neta Mends, MD Taking Active   guaiFENesin-codeine 100-10 MG/5ML syrup [id number] No PLACE INTO FEEDING TUBE TWICE DAILY FOR COUGH Malachy Mood, MD Taking Active   lamoTRIgine (LAMICTAL) 100 MG tablet [id number]  Take 1 tablet (100 mg total) by mouth 2 (two) times daily. Butch Penny, NP  Active    loperamide (IMODIUM) 2 MG capsule [id number] No 2 mg daily as needed for diarrhea or loose stools. Per tube [provider] Taking Active Spouse/Significant Other  Multiple Minerals-Vitamins (CAL-MAG-ZINC-D PO) [id number] No Take 1 tablet by mouth 2 (two) times daily. [provider] Taking Active Spouse/Significant Other  Multiple Vitamin (MULTIVITAMIN WITH MINERALS) TABS tablet [id number] No Place 1 tablet into feeding tube in the morning. [provider] Taking Active Spouse/Significant Other  mupirocin ointment (BACTROBAN) 2 % [id number] No  [provider] Taking Active   Nutritional Supplements (NUTREN 1.5) LIQD [id number] No Place 237 mLs into feeding tube daily. [provider] Taking Active Spouse/Significant Other  omeprazole (PRILOSEC) 40 MG capsule [id number] No Take 1 capsule (40 mg total) by mouth daily. Iva Boop, MD Taking Active   ondansetron (ZOFRAN-ODT) 4 MG disintegrating tablet [id number] No DISSOLVE 1 TO 2 TABLETS(4 TO 8 MG) ON THE TONGUE EVERY 8 HOURS AS NEEDED FOR NAUSEA OR VOMITING Malachy Mood, MD Taking Active   Polyethyl Glycol-Propyl Glycol (SYSTANE) 0.4-0.3 % GEL ophthalmic gel [id number] No Place 1 application into both eyes 2 (two) times daily as needed (dry eyes). [provider] Taking Active Spouse/Significant Other  prochlorperazine (COMPAZINE) 10 MG tablet [id number] No TAKE 1 TABLET(10 MG) BY MOUTH EVERY 6 HOURS AS NEEDED FOR NAUSEA OR VOMITING Malachy Mood, MD Taking Active Spouse/Significant Other              Assessment/Plan:   Hypertension: - Currently uncontrolled - Reviewed long term cardiovascular and renal outcomes of uncontrolled blood pressure - Reviewed appropriate blood pressure monitoring technique and reviewed goal blood pressure. Recommended to check home blood pressure and heart rate 2-3x/week - Recommend to continue current medication therapy     Follow Up Plan: 1 week  Sherrill Raring, PharmD Clinical Pharmacist [phone]

## 2023-04-18 ENCOUNTER — Ambulatory Visit (HOSPITAL_COMMUNITY)
Admission: RE | Admit: 2023-04-18 | Discharge: 2023-04-18 | Disposition: A | Discharge status: Home / Self Care | Payer: Medicare Other | Source: Ambulatory Visit | Attending: Nurse Practitioner | Admitting: Nurse Practitioner

## 2023-04-18 ENCOUNTER — Other Ambulatory Visit: Payer: Self-pay | Admitting: Nurse Practitioner

## 2023-04-18 ENCOUNTER — Ambulatory Visit: Payer: Medicare Other | Admitting: Family Medicine

## 2023-04-18 DIAGNOSIS — Z0000 Encounter for general adult medical examination without abnormal findings: Secondary | ICD-10-CM

## 2023-04-18 DIAGNOSIS — R229 Localized swelling, mass and lump, unspecified: Secondary | ICD-10-CM | POA: Diagnosis present

## 2023-04-18 DIAGNOSIS — C153 Malignant neoplasm of upper third of esophagus: Secondary | ICD-10-CM

## 2023-04-18 MED ORDER — IOHEXOL 300 MG/ML  SOLN
65.0000 mL | Freq: Once | INTRAMUSCULAR | Status: AC | PRN
  Administered 2023-04-18: 65 mL via INTRAVENOUS

## 2023-04-18 NOTE — Progress Notes (Signed)
PATIENT CHECK-IN and HEALTH RISK ASSESSMENT QUESTIONNAIRE:  -completed by phone/video for upcoming Medicare Preventive Visit  Pre-Visit Check-in: 1)Vitals (height, wt, BP, etc) - record in vitals section for visit on day of visit Request home vitals (wt, BP, etc.) and enter into vitals, THEN update Vital Signs SmartPhrase below at the top of the HPI. See below.  2)Review and Update Medications, Allergies PMH, Surgeries, Social history in Epic 3)Hospitalizations in the last year with date/reason? no  4)Review and Update Care Team (patient's specialists) in Epic 5) Complete PHQ9 in Epic  6) Complete Fall Screening in Epic 7)Review all Health Maintenance Due and order under PCP if not done.  Medicare Wellness Patient Questionnaire:  Answer theses question about your habits: How often do you have a drink containing alcohol? Few times a month How many drinks containing alcohol do you have on a typical day when you are drinking? 1-2 How often do you have six or more drinks on one occasion?never Have you ever smoked? no How many packs a day do/did you smoke? no Do you use smokeless tobacco?no Do you use an illicit drugs? no On average, how many days per week do you engage in moderate to strenuous exercise (like a brisk walk)? When gym is open she goes 3 days per week, uses machines, usually about 20 minutes Meals are provided and she reports they make good meals at retirement community and goes out occassionally  Answer theses question about your everyday activities: Can you perform most household chores? Yes, has cleaning lady Are you deaf or have significant trouble hearing?no Do you feel that you have a problem with memory?no Do you feel safe at home? yes Last dentist visit? Twice a year 8. Do you have any difficulty performing your everyday activities?no Are you having any difficulty walking, taking medications on your own, and or difficulty managing daily home needs?no Do you have  difficulty walking or climbing stairs? no Do you have difficulty dressing or bathing?no Do you have difficulty doing errands alone such as visiting a doctor's office or shopping?no Do you currently have any difficulty preparing food and eating?no Do you currently have any difficulty using the toilet?no Do you have any difficulty managing your finances?no Do you have any difficulties with housekeeping of managing your housekeeping?no   Do you have Advanced Directives in place (Living Will, Healthcare Power or Attorney)? yes   Last eye Exam and location? Twice a year   Do you currently use prescribed or non-prescribed narcotic or opioid pain medications? no  Do you have a history or close family history of breast, ovarian, tubal or peritoneal cancer or a family member with BRCA (breast cancer susceptibility 1 and 2) gene mutations?   Nurse/Assistant Credentials/time stamp:   ----------------------------------------------------------------------------------------------------------------------------------------------------------------------------------------------------------------------  Because this visit was a virtual/telehealth visit, some criteria may be missing or patient reported. Any vitals not documented were not able to be obtained and vitals that have been documented are patient reported.    MEDICARE ANNUAL PREVENTIVE VISIT WITH PROVIDER: (Welcome to Medicare, initial annual wellness or annual wellness exam)  Virtual Visit via Phone Note  I connected with [name] on 04/18/23 by phone and verified that I am speaking with the correct person using two identifiers.  Location patient: home Location provider:work or home office Persons participating in the virtual visit: patient, provider  Concerns and/or follow up today: reports is stable. Reports had CT today with specialist for follow up on the cancer. Estrogen cream has helped with the  UTIs, less frequent  lately. BPs at home running 140/80s on average.   See HM section in Epic for other details of completed HM.    ROS: negative for report of fevers, unintentional weight loss, vision changes, vision loss, hearing loss or change, chest pain, sob, hemoptysis, melena, hematochezia, hematuria, falls, bleeding or bruising, thoughts of suicide or self harm, memory loss  Patient-completed extensive health risk assessment - reviewed and discussed with the patient: See Health Risk Assessment completed with patient prior to the visit either above or in recent phone note. This was reviewed in detailed with the patient today and appropriate recommendations, orders and referrals were placed as needed per Summary below and patient instructions.   Review of Medical History: -PMH, PSH, Family History and current specialty and care providers reviewed and updated and listed below   Patient Care Team: [name], Neta Mends, MD as PCP - General Jodelle Red, MD as PCP - Cardiology (Cardiology) Theodoro Clock (Neurosurgery) Bjorn Pippin, MD as Attending Physician (Urology) Maeola Harman, MD as Consulting Physician (Neurosurgery) Anson Fret, MD as Consulting Physician (Neurology) Iva Boop, MD as Consulting Physician (Gastroenterology) Jeani Hawking, MD as Consulting Physician (Gastroenterology) Drema Halon, MD (Inactive) as Consulting Physician (Otolaryngology) Malachy Mood, MD as Consulting Physician (Hematology) Dorothy Puffer, MD as Consulting Physician (Radiation Oncology) Dorothy Puffer, MD as Consulting Physician (Radiation Oncology) Sherrill Raring, [facility] (Pharmacist)   Past Medical History:  Diagnosis Date   Abdominal pain 05/29/2013   s/p rx of cephalo resistant e coli   but last rx NG  now residular ?  bladder sx repeat cx sx rx to ty and uro consult    ADJ DISORDER WITH MIXED ANXIETY & DEPRESSED MOOD 03/03/2010   Qualifier: Diagnosis of  By: Fabian Sharp MD, Neta Mends    Agent resistant  to multiple antibiotics 05/29/2013   e coli   bu NG on fu.     Anemia    Anxiety    ARF (acute renal failure) (HCC) 03/12/2015   Closed head injury 02/01/2011   from syncope and had scalp laceration  neg ct .     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    Pyelonephritis 03/12/2015   Sensation of pain in anesthetized distribution of trigeminal nerve    Syncope 02/01/2011   In shower on vacation  sustained head laceration  8 sutures Had ed visit neg head ct labs and x ray    Trigeminal neuralgia pain     Past Surgical History:  Procedure Laterality Date   ABDOMINAL HYSTERECTOMY  2002   tubal   BACK SURGERY     2 times, for sciatic nerve pain  BALLOON DILATION  06/21/2021   Procedure: DILATION BALLOON used for esophageal stricture;  Surgeon: Lemar Lofty., MD;  Location: [facility];  Service: Gastroenterology;;   CARDIAC CATHETERIZATION  2000   chest pains neg   CHOLECYSTECTOMY N/A 02/21/2017   Procedure: LAPAROSCOPIC CHOLECYSTECTOMY WITH INTRAOPERATIVE CHOLANGIOGRAM;  Surgeon: Darnell Level, MD;  Location: [facility];  Service: General;  Laterality: N/A;   COLONOSCOPY     multiple   CRANIOTOMY  12/09/2011   nerve decompression right trigeminal    DIRECT LARYNGOSCOPY N/A 02/12/2021   Procedure: DIRECT LARYNGOSCOPY AND BIOPSY POSSIBLE FROZEN;  Surgeon: Drema Halon, MD;  Location:  [facility];  Service: ENT;  Laterality: N/A;   DOPPLER ECHOCARDIOGRAPHY  2009   nl lv function mild lv dilitation   ESOPHAGOGASTRODUODENOSCOPY (EGD) WITH PROPOFOL N/A 06/21/2021   Procedure: ESOPHAGOGASTRODUODENOSCOPY (EGD) WITH PROPOFOL;  Surgeon: Lemar Lofty., MD;  Location: [facility];  Service: Gastroenterology;  Laterality: N/A;  Request Fluoroscopy; Plan in for dilation   ESOPHAGOGASTRODUODENOSCOPY (EGD) WITH PROPOFOL N/A 07/19/2021   Procedure: ESOPHAGOGASTRODUODENOSCOPY (EGD) WITH PROPOFOL;  Surgeon: Meridee Score Netty Starring., MD;  Location: [facility];  Service: Endoscopy;  Laterality: N/A;  Dilation fluoro   ESOPHAGOGASTRODUODENOSCOPY (EGD) WITH PROPOFOL N/A 08/05/2021   Procedure: ESOPHAGOGASTRODUODENOSCOPY (EGD) WITH PROPOFOL - fluoro;  Surgeon: Meridee Score Netty Starring., MD;  Location: [facility];  Service: Gastroenterology;  Laterality: N/A;   ESOPHAGOGASTRODUODENOSCOPY (EGD) WITH PROPOFOL N/A 08/23/2021   Procedure: ESOPHAGOGASTRODUODENOSCOPY (EGD) WITH PROPOFOL;  Surgeon: Iva Boop, MD;  Location: [facility];  Service: Gastroenterology;  Laterality: N/A;  Using Fluoroscopy and esophageal dilation   ESOPHAGOGASTRODUODENOSCOPY (EGD) WITH PROPOFOL N/A 09/22/2021   Procedure: ESOPHAGOGASTRODUODENOSCOPY (EGD) WITH PROPOFOL;  Surgeon: Iva Boop, MD;  Location: [facility];  Service: Gastroenterology;  Laterality: N/A;  EGD with savary and flour   ESOPHAGOGASTRODUODENOSCOPY (EGD) WITH PROPOFOL N/A 12/07/2021   Procedure: ESOPHAGOGASTRODUODENOSCOPY (EGD) WITH PROPOFOL;  Surgeon: Iva Boop, MD;  Location: [facility];  Service: Gastroenterology;  Laterality: N/A;  Schedule this with Fluroscopy, it is a savary dilation   ESOPHAGOGASTRODUODENOSCOPY (EGD) WITH PROPOFOL N/A 04/05/2022   Procedure: ESOPHAGOGASTRODUODENOSCOPY (EGD) WITH PROPOFOL;  Surgeon: Iva Boop, MD;  Location: [facility];  Service: Gastroenterology;  Laterality: N/A;  EGD  /savary dilation with fluoro   EYE SURGERY Bilateral    ioc for catatracts   GASTROSTOMY N/A 02/26/2021   Procedure: OPEN GASTROSTOMY TUBE PLACEMENT;  Surgeon: Darnell Level, MD;  Location: [facility];  Service: General;  Laterality: N/A;   IR GASTROSTOMY TUBE MOD SED  02/22/2021   IR GASTROSTOMY TUBE REMOVAL  02/10/2022   IR REPLACE G-TUBE SIMPLE WO FLUORO  09/07/2021   laparoscopic gallbladder surgery  02/16/2017   Fax from [facility]   OOPHORECTOMY Bilateral 2002   rt shoulder surgery     SAVORY DILATION N/A 06/21/2021   Procedure: SAVORY DILATION;  Surgeon: Lemar Lofty., MD;  Location: [facility];  Service: Gastroenterology;  Laterality: N/A;   SAVORY DILATION N/A 07/19/2021   Procedure: SAVORY DILATION;  Surgeon: Meridee Score Netty Starring., MD;  Location: [facility];  Service: Endoscopy;  Laterality: N/A;   SAVORY DILATION N/A 08/05/2021   Procedure: SAVORY DILATION;  Surgeon: Meridee Score Netty Starring., MD;  Location: [facility];  Service: Gastroenterology;  Laterality: N/A;   SAVORY DILATION N/A 08/23/2021   Procedure: SAVORY DILATION;  Surgeon: Iva Boop, MD;  Location: [facility];  Service: Gastroenterology;  Laterality: N/A;   SAVORY DILATION N/A 09/22/2021   Procedure: SAVORY  DILATION;  Surgeon: Iva Boop, MD;  Location: [facility];  Service: Gastroenterology;  Laterality: N/A;   SAVORY DILATION N/A 12/07/2021   Procedure: SAVORY DILATION;  Surgeon: Iva Boop, MD;  Location: [facility];  Service: Gastroenterology;  Laterality: N/A;   SAVORY DILATION N/A 04/05/2022   Procedure: SAVORY DILATION;  Surgeon: Iva Boop, MD;  Location: [facility];  Service: Gastroenterology;  Laterality: N/A;    Social History   Socioeconomic History   Marital status: Married    Spouse name: Not on file   Number of children: 2   Years of education: Not on file   Highest education level: Not on file  Occupational History    Comment: retired Pharmacist, hospital  Tobacco Use   Smoking status: Never   Smokeless tobacco: Never  Vaping Use   Vaping status: Never Used  Substance and Sexual Activity   Alcohol use: Yes    Comment: occ wine   Drug use: No   Sexual activity: Not on file  Other Topics Concern   Not on file  Social History Narrative   Married   HH of 2-3 (god daughter)   Pets 2 dogs   Non smoker    Child is a physician   G2P2      Caffeine: 2 cups/day   Social Determinants of Health   Financial Resource Strain: Low Risk  (04/12/2022)   Overall Financial Resource Strain (CARDIA)    Difficulty of Paying Living Expenses: Not hard at all  Food Insecurity: No Food Insecurity (08/08/2022)   Hunger Vital Sign    Worried About Running Out of Food in the Last Year: Never true    Ran Out of Food in the Last Year: Never true  Transportation Needs: No Transportation Needs (08/08/2022)   PRAPARE - Administrator, Civil Service (Medical): No    Lack of Transportation (Non-Medical): No  Physical Activity: Insufficiently Active (04/12/2022)   Exercise Vital Sign    Days of Exercise per Week: 3 days    Minutes of Exercise per Session: 30 min  Stress: No Stress Concern Present (04/12/2022)   [employer] of Occupational Health - Occupational Stress Questionnaire    Feeling of Stress : Not at all  Social Connections: Moderately Isolated (04/12/2022)   Social Connection and Isolation Panel [NHANES]    Frequency of Communication with Friends and Family: More than three times a week    Frequency of Social Gatherings with Friends and Family: More than three times a week    Attends Religious Services: Never    Database administrator or Organizations: No    Attends Banker Meetings: Never    Marital Status: Married  Catering manager Violence: Not At Risk (04/12/2022)   Humiliation, Afraid, Rape, and Kick questionnaire    Fear of Current or Ex-Partner: No    Emotionally Abused: No    Physically Abused: No     Sexually Abused: No    Family History  Problem Relation Age of Onset   Cancer Mother        uterine   Ovarian cancer Mother    Stroke Mother    Alcohol abuse Father    Stroke Father    Diabetes Brother    Cancer Paternal Aunt        leukemia, unknown type   Seizures Daughter    Hypertension Other    Colon cancer Neg Hx    Dementia Neg Hx  Alzheimer's disease Neg Hx     Current Outpatient Medications on File Prior to Visit  Medication Sig Dispense Refill   acetaminophen (TYLENOL) 500 MG tablet Take 500-1,000 mg by mouth every 4 (four) hours as needed for headache, mild pain or moderate pain.     albuterol (VENTOLIN HFA) 108 (90 Base) MCG/ACT inhaler INHALE 1 TO 2 PUFFS INTO THE LUNGS EVERY 6 HOURS AS NEEDED FOR WHEEZING OR SHORTNESS OF BREATH 6.7 g 0   Calcium Carb-Cholecalciferol 600-20 MG-MCG TABS Take by mouth.     carvedilol (COREG) 25 MG tablet TAKE 1 TABLET(25 MG) BY MOUTH TWICE DAILY WITH A MEAL 60 tablet 2   conjugated estrogens (PREMARIN) vaginal cream Place 1 applicator vaginally once a week.     fluticasone (FLONASE) 50 MCG/ACT nasal spray Place 2 sprays into both nostrils as needed for allergies or rhinitis. (Patient taking differently: Place 2 sprays into both nostrils daily as needed for allergies or rhinitis.) 16 g 5   Fluticasone-Umeclidin-Vilant (TRELEGY ELLIPTA) 100-62.5-25 MCG/ACT AEPB Inhale 1 puff into the lungs daily. (Patient not taking: Reported on 03/28/2023)     furosemide (LASIX) 20 MG tablet TAKE 1 TABLET(20 MG) BY MOUTH DAILY 90 tablet 0   guaiFENesin-codeine 100-10 MG/5ML syrup PLACE INTO FEEDING TUBE TWICE DAILY FOR COUGH 236 mL 0   lamoTRIgine (LAMICTAL) 100 MG tablet Take 1 tablet (100 mg total) by mouth 2 (two) times daily. 180 tablet 1   loperamide (IMODIUM) 2 MG capsule 2 mg daily as needed for diarrhea or loose stools. Per tube     Multiple Minerals-Vitamins (CAL-MAG-ZINC-D PO) Take 1 tablet by mouth 2 (two) times daily.     Multiple  Vitamin (MULTIVITAMIN WITH MINERALS) TABS tablet Place 1 tablet into feeding tube in the morning.     mupirocin ointment (BACTROBAN) 2 %      Nutritional Supplements (NUTREN 1.5) LIQD Place 237 mLs into feeding tube daily.     omeprazole (PRILOSEC) 40 MG capsule Take 1 capsule (40 mg total) by mouth daily. 90 capsule 3   ondansetron (ZOFRAN-ODT) 4 MG disintegrating tablet DISSOLVE 1 TO 2 TABLETS(4 TO 8 MG) ON THE TONGUE EVERY 8 HOURS AS NEEDED FOR NAUSEA OR VOMITING 30 tablet 1   Polyethyl Glycol-Propyl Glycol (SYSTANE) 0.4-0.3 % GEL ophthalmic gel Place 1 application into both eyes 2 (two) times daily as needed (dry eyes).     prochlorperazine (COMPAZINE) 10 MG tablet TAKE 1 TABLET(10 MG) BY MOUTH EVERY 6 HOURS AS NEEDED FOR NAUSEA OR VOMITING 30 tablet 1   No current facility-administered medications on file prior to visit.    Allergies  Allergen Reactions   Bactrim [Sulfamethoxazole-Trimethoprim] Hives and Itching   Hydrocodone Other (See Comments)    Rebound headaches   Sulfa Antibiotics Other (See Comments)       Physical Exam Vitals requested from patient and listed below if patient had equipment and was able to obtain at home for this virtual visit: There were no vitals filed for this visit. Estimated body mass index is 21.66 kg/m as calculated from the following:   Height as of 03/23/23: 5\' 6"  (1.676 m).   Weight as of 03/28/23: 134 lb 3.2 oz (60.9 kg).  EKG (optional): deferred due to virtual visit  GENERAL: alert, oriented, no acute distress detected, full vision exam deferred due to pandemic and/or virtual encounter  PSYCH/NEURO: pleasant and cooperative, no obvious depression or anxiety, speech and thought processing grossly intact, Cognitive function grossly intact  [employer]  Visit from [facility]/17/2023 in [facility] [place]  PHQ-9 Total Score 0           04/18/2023    3:32 PM 01/17/2023   11:38 AM 04/12/2022    3:39 PM 01/27/2022     1:14 PM 12/27/2021    2:43 PM  Depression screen PHQ 2/9  Decreased Interest 0 0 0 0 0  Down, Depressed, Hopeless 0 0 0 0 0  PHQ - 2 Score 0 0 0 0 0  Altered sleeping  0  0 0  Tired, decreased energy  0  0 1  Change in appetite  0  0 0  Feeling bad or failure about yourself   0  0 0  Trouble concentrating  0  0 0  Moving slowly or fidgety/restless  0  0 0  Suicidal thoughts  0  0 0  PHQ-9 Score  0  0 1  Difficult doing work/chores     Not difficult at all       04/12/2022    3:40 PM 05/05/2022    1:47 PM 08/25/2022   11:02 AM 01/17/2023   11:37 AM 04/18/2023    3:18 PM  Fall Risk  Falls in the past year? 0  1 0 0  Was there an injury with Fall? 0  0 0 0  Fall Risk Category Calculator 0  2 0 0  Fall Risk Category (Retired) Low      (RETIRED) Patient Fall Risk Level Low fall risk High fall risk     Patient at Risk for Falls Due to No Fall Risks  Other (Comment) No Fall Risks   Fall risk Follow up Falls prevention discussed  Falls evaluation completed Falls evaluation completed      SUMMARY AND PLAN:  Encounter for Medicare annual wellness exam   Discussed applicable health maintenance/preventive health measures and advised and referred or ordered per patient preferences: -she declined the covid vaccine Health Maintenance  Topic Date Due   COVID-19 Vaccine (3 - Pfizer risk series) 05/04/2023 (Originally 07/20/2019)   Medicare Annual Wellness (AWV)  04/17/2024   DTaP/Tdap/Td (5 - Td or Tdap) 10/19/2032   Pneumonia Vaccine 50+ Years old  Completed   INFLUENZA VACCINE  Completed   DEXA SCAN  Completed   Zoster Vaccines- Shingrix  Completed   HPV VACCINES  Aged Out    Education and counseling on the following was provided based on the above review of health and a plan/checklist for the patient, along with additional information discussed, was provided for the patient in the patient instructions :   -Provided safe balance exercises that can be done at home to improve  balance  - see pt instructions. Also discussed exercises she can do at home when gym is closed for maintaining leg strength.  -Advised and counseled on a healthy lifestyle - including the importance of a healthy diet, regular physical activity -Reviewed patient's current diet. Advised and counseled on a whole foods based healthy diet. A summary of a healthy diet was provided in the Patient Instructions.  -reviewed patient's current physical activity level and discussed exercise guidelines for adults. Discussed community resources and ideas for safe exercise at home to assist in meeting exercise guideline recommendations in a safe and healthy way.  -Advise yearly dental visits at minimum and regular eye exams -Advised and counseled on alcohol safe limits  Follow up: see patient instructions     Patient Instructions  I really enjoyed getting to  talk with you today! I am available on Tuesdays and Thursdays for virtual visits if you have any questions or concerns, or if I can be of any further assistance.   CHECKLIST FROM ANNUAL WELLNESS VISIT:  -Follow up (please call to schedule if not scheduled after visit):   -yearly for annual wellness visit with primary care office  Here is a list of your preventive care/health maintenance measures and the plan for each if any are due:  PLAN For any measures below that may be due:   Health Maintenance  Topic Date Due   COVID-19 Vaccine (3 - Pfizer risk series) 05/04/2023 (Originally 07/20/2019)   Medicare Annual Wellness (AWV)  04/17/2024   DTaP/Tdap/Td (5 - Td or Tdap) 10/19/2032   Pneumonia Vaccine 78+ Years old  Completed   INFLUENZA VACCINE  Completed   DEXA SCAN  Completed   Zoster Vaccines- Shingrix  Completed   HPV VACCINES  Aged Out    -See a dentist at least yearly  -Get your eyes checked and then per your eye specialist's recommendations  -Other issues addressed today:   -I have included below further information regarding a  healthy whole foods based diet, physical activity guidelines for adults, stress management and opportunities for social connections. I hope you find this information useful.   -----------------------------------------------------------------------------------------------------------------------------------------------------------------------------------------------------------------------------------------------------------  NUTRITION: -eat real food: lots of colorful vegetables (half the plate) and fruits -5-7 servings of vegetables and fruits per day (fresh or steamed is best), exp. 2 servings of vegetables with lunch and dinner and 2 servings of fruit per day. Berries and greens such as kale and collards are great choices.  -consume on a regular basis: whole grains (make sure first ingredient on label contains the word "whole"), fresh fruits, fish, nuts, seeds, healthy oils (such as olive oil, avocado oil, grape seed oil) -may eat small amounts of dairy and lean meat on occasion, but avoid processed meats such as ham, bacon, lunch meat, etc. -drink water -try to avoid fast food and pre-packaged foods, processed meat -most experts advise limiting sodium to < 2300mg  per day, should limit further is any chronic conditions such as high blood pressure, heart disease, diabetes, etc. The American Heart Association advised that < 1500mg  is is ideal -try to avoid foods that contain any ingredients with names you do not recognize  -try to avoid sugar/sweets (except for the natural sugar that occurs in fresh fruit) -try to avoid sweet drinks -try to avoid white rice, white bread, pasta (unless whole grain), white or yellow potatoes  EXERCISE GUIDELINES FOR ADULTS: -if you wish to increase your physical activity, do so gradually and with the approval of your doctor -STOP and seek medical care immediately if you have any chest pain, chest discomfort or trouble breathing when starting or increasing exercise   -move and stretch your body, legs, feet and arms when sitting for long periods -Physical activity guidelines for optimal health in adults: -least 150 minutes per week of aerobic exercise (can talk, but not sing) once approved by your doctor, 20-30 minutes of sustained activity or two 10 minute episodes of sustained activity every day.  -resistance training at least 2 days per week if approved by your doctor -balance exercises 3+ days per week:   Stand somewhere where you have something sturdy to hold onto if you lose balance.    1) lift up on toes, start with 5x per day and work up to 20x   2) stand and lift on leg straight out  to the side so that foot is a few inches of the floor, start with 5x each side and work up to 20x each side   3) stand on one foot, start with 5 seconds each side and work up to 20 seconds on each side  If you need ideas or help with getting more active:  -Silver sneakers [email or web address].[email or web address]  -Walk with a Doc: [email or web address].duncan-[email or web address]/  -try to include resistance (weight lifting/strength building) and balance exercises twice per week: or the following link for ideas: http://castillo-powell.com/  BuyDucts.dk  STRESS MANAGEMENT: -can try meditating, or just sitting quietly with deep breathing while intentionally relaxing all parts of your body for 5 minutes daily -if you need further help with stress, anxiety or depression please follow up with your primary doctor or contact the wonderful folks at [facility]: [phone]  SOCIAL CONNECTIONS: -options in [place] if you wish to engage in more social and exercise related activities:  -Silver sneakers https://tools.silversneakers.com  -Walk with a Doc: http://www.duncan-williams.com/  -Check out the [facility] Active Adults 50+ section on the [place] of [employer] (hiking clubs, book clubs,  cards and games, chess, exercise classes, aquatic classes and much more) - see the website for details: https://www.[place]-.gov/departments/parks-recreation/active-adults50  -YouTube has lots of exercise videos for different ages and abilities as well  -[facility] (a variety of indoor and outdoor inperson activities for adults). [phone]. [address].  -Virtual Online Classes (a variety of topics): see seniorplanet.org or call [phone]  -consider volunteering at a school, hospice center, church, senior center or elsewhere           Terressa Koyanagi, DO

## 2023-04-18 NOTE — Patient Instructions (Signed)
I really enjoyed getting to talk with you today! I am available on Tuesdays and Thursdays for virtual visits if you have any questions or concerns, or if I can be of any further assistance.   CHECKLIST FROM ANNUAL WELLNESS VISIT:  -Follow up (please call to schedule if not scheduled after visit):   -yearly for annual wellness visit with primary care office  Here is a list of your preventive care/health maintenance measures and the plan for each if any are due:  PLAN For any measures below that may be due:   Health Maintenance  Topic Date Due   COVID-19 Vaccine (3 - Pfizer risk series) 05/04/2023 (Originally 07/20/2019)   Medicare Annual Wellness (AWV)  04/17/2024   DTaP/Tdap/Td (5 - Td or Tdap) 10/19/2032   Pneumonia Vaccine 27+ Years old  Completed   INFLUENZA VACCINE  Completed   DEXA SCAN  Completed   Zoster Vaccines- Shingrix  Completed   HPV VACCINES  Aged Out    -See a dentist at least yearly  -Get your eyes checked and then per your eye specialist's recommendations  -Other issues addressed today:   -I have included below further information regarding a healthy whole foods based diet, physical activity guidelines for adults, stress management and opportunities for social connections. I hope you find this information useful.   -----------------------------------------------------------------------------------------------------------------------------------------------------------------------------------------------------------------------------------------------------------  NUTRITION: -eat real food: lots of colorful vegetables (half the plate) and fruits -5-7 servings of vegetables and fruits per day (fresh or steamed is best), exp. 2 servings of vegetables with lunch and dinner and 2 servings of fruit per day. Berries and greens such as kale and collards are great choices.  -consume on a regular basis: whole grains (make sure first ingredient on label contains the word  "whole"), fresh fruits, fish, nuts, seeds, healthy oils (such as olive oil, avocado oil, grape seed oil) -may eat small amounts of dairy and lean meat on occasion, but avoid processed meats such as ham, bacon, lunch meat, etc. -drink water -try to avoid fast food and pre-packaged foods, processed meat -most experts advise limiting sodium to < 2300mg  per day, should limit further is any chronic conditions such as high blood pressure, heart disease, diabetes, etc. The American Heart Association advised that < 1500mg  is is ideal -try to avoid foods that contain any ingredients with names you do not recognize  -try to avoid sugar/sweets (except for the natural sugar that occurs in fresh fruit) -try to avoid sweet drinks -try to avoid white rice, white bread, pasta (unless whole grain), white or yellow potatoes  EXERCISE GUIDELINES FOR ADULTS: -if you wish to increase your physical activity, do so gradually and with the approval of your doctor -STOP and seek medical care immediately if you have any chest pain, chest discomfort or trouble breathing when starting or increasing exercise  -move and stretch your body, legs, feet and arms when sitting for long periods -Physical activity guidelines for optimal health in adults: -least 150 minutes per week of aerobic exercise (can talk, but not sing) once approved by your doctor, 20-30 minutes of sustained activity or two 10 minute episodes of sustained activity every day.  -resistance training at least 2 days per week if approved by your doctor -balance exercises 3+ days per week:   Stand somewhere where you have something sturdy to hold onto if you lose balance.    1) lift up on toes, start with 5x per day and work up to 20x   2) stand and lift  on leg straight out to the side so that foot is a few inches of the floor, start with 5x each side and work up to 20x each side   3) stand on one foot, start with 5 seconds each side and work up to 20 seconds on  each side  If you need ideas or help with getting more active:  -Silver sneakers https://tools.silversneakers.com  -Walk with a Doc: http://www.duncan-[email or web address]/  -try to include resistance (weight lifting/strength building) and balance exercises twice per week: or the following link for ideas: http://castillo-powell.com/  BuyDucts.dk  STRESS MANAGEMENT: -can try meditating, or just sitting quietly with deep breathing while intentionally relaxing all parts of your body for 5 minutes daily -if you need further help with stress, anxiety or depression please follow up with your primary doctor or contact the wonderful folks at [facility]: [phone]  SOCIAL CONNECTIONS: -options in [place] if you wish to engage in more social and exercise related activities:  -Silver sneakers https://tools.silversneakers.com  -Walk with a Doc: http://www.duncan-[email or web address]/  -Check out the [facility] Active Adults 50+ section on the [place] [employer] (hiking clubs, book clubs, cards and games, chess, exercise classes, aquatic classes and much more) - see the website for details: https://www.[place].gov/departments/parks-recreation/active-adults50  -YouTube has lots of exercise videos for different ages and abilities as well  -[facility] (a variety of indoor and outdoor inperson activities for adults). [phone]. [address]  -Virtual Online Classes (a variety of topics): see seniorplanet.org or call [phone]  -consider volunteering at a school, hospice center, church, senior center or elsewhere

## 2023-04-19 ENCOUNTER — Encounter: Payer: Self-pay | Admitting: Internal Medicine

## 2023-04-19 ENCOUNTER — Encounter: Payer: Self-pay | Admitting: Nurse Practitioner

## 2023-04-19 ENCOUNTER — Ambulatory Visit: Payer: Medicare Other

## 2023-04-19 ENCOUNTER — Ambulatory Visit (INDEPENDENT_AMBULATORY_CARE_PROVIDER_SITE_OTHER): Payer: Medicare Other | Admitting: Internal Medicine

## 2023-04-19 VITALS — BP 128/69 | HR 54 | Temp 97.8°F | Ht 66.0 in | Wt 134.0 lb

## 2023-04-19 DIAGNOSIS — R058 Other specified cough: Secondary | ICD-10-CM | POA: Diagnosis not present

## 2023-04-19 MED ORDER — FAMOTIDINE 20 MG PO TABS: ORAL_TABLET | ORAL | 11 refills | Status: AC

## 2023-04-19 MED ORDER — OMEPRAZOLE 40 MG PO CPDR: DELAYED_RELEASE_CAPSULE | ORAL | 1 refills | Status: DC

## 2023-04-19 NOTE — Progress Notes (Unsigned)
[name], female    DOB: Aug 10, 1940   MRN: [id number]   Brief patient profile:  33 yowf  never smoker referred to pulmonary clinic 04/19/2023 by Dr Fabian Sharp for doe  onset mid summer 2024 assoc with dysphagia/ choking sensations   S/p throat ca RT /chemo  ENT  has to chew food and freq choking since rx.   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 EGD 9/15 and CT neck 02/05/21 showed 6 cm mass in the cervical/upper esophagus with possible local adenopathy. 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 Cliffton Asters felt she was not a surgical candidate.  -she received concurrent chemoRT with carbo/taxol 03/08/21 - 04/14/21.  -given PDL1+, she began immunotherapy with Nivolumab on 05/21/21, and completed 1 year therapy in December 2023. -last EGD in November 2023 showed benign stenosis, no evidence of recurrence or residual disease. -restaging CT CAP on 07/27/2022 showed no evidence of disease. --12/06/2022 Ct Cap showed no evidence of metastatic disease. Recommended repeat CT Cap in 1 year (11/2023). Has been clinically doing well.       History of Present Illness  04/19/2023  Pulmonary/ 1st office eval/[name]  Chief Complaint  Patient presents with   pulmonary consult    SOB improved with trelegy.  Non prod cough.   Dyspnea:  able to do 20 min  on ex machine building up speed  Cough: no cough on wakening  in am / more in daytime/ non productive  no better on vs off trelegy  Sleep: 30 deg hob elevated / one pillow off trelegy for a month cannot tell the difference  SABA use: not using   No obvious day to day or daytime pattern/variability or assoc excess/ purulent sputum or mucus plugs or hemoptysis or cp or chest tightness, subjective wheeze or overt sinus or hb symptoms.    Also denies any obvious fluctuation of symptoms with weather  or environmental changes or other aggravating or alleviating factors except as outlined above   No unusual exposure hx or h/o childhood pna/ asthma or knowledge of premature birth.  Current Allergies, Complete Past Medical History, Past Surgical History, Family History, and Social History were reviewed in [employer] record.  ROS  The following are not active complaints unless bolded Hoarseness, sore throat, dysphagia/ globus sensation, dental problems, itching, sneezing,  nasal congestion or discharge of excess mucus or purulent secretions, ear ache,   fever, chills, sweats, unintended wt loss or wt gain, classically pleuritic or exertional cp,  orthopnea pnd or arm/hand swelling  or leg swelling, presyncope, palpitations, abdominal pain, anorexia, nausea, vomiting, diarrhea  or change in bowel habits or change in bladder habits, change in stools or change in urine, dysuria, hematuria,  rash, arthralgias, visual complaints, headache, numbness, weakness or ataxia or problems with walking or coordination,  change in mood or  memory.             Outpatient Medications Prior to Visit  Medication Sig Dispense Refill   acetaminophen (TYLENOL) 500 MG tablet Take 500-1,000 mg by mouth every 4 (four) hours as needed for headache, mild pain or moderate pain.     albuterol (VENTOLIN HFA) 108 (90 Base) MCG/ACT inhaler INHALE 1 TO 2 PUFFS INTO THE LUNGS EVERY 6 HOURS AS NEEDED FOR WHEEZING OR SHORTNESS OF BREATH 6.7 g 0   Calcium Carb-Cholecalciferol 600-20 MG-MCG TABS Take  by mouth.     carvedilol (COREG) 25 MG tablet TAKE 1 TABLET(25 MG) BY MOUTH TWICE DAILY WITH A MEAL 60 tablet 2   conjugated estrogens (PREMARIN) vaginal cream Place 1 applicator vaginally once a week.     fluticasone (FLONASE) 50 MCG/ACT nasal spray Place 2 sprays into both nostrils as needed for allergies or rhinitis. (Patient taking differently: Place 2 sprays into both nostrils daily as needed for allergies or  rhinitis.) 16 g 5   furosemide (LASIX) 20 MG tablet TAKE 1 TABLET(20 MG) BY MOUTH DAILY 90 tablet 0   guaiFENesin-codeine 100-10 MG/5ML syrup PLACE INTO FEEDING TUBE TWICE DAILY FOR COUGH 236 mL 0   lamoTRIgine (LAMICTAL) 100 MG tablet Take 1 tablet (100 mg total) by mouth 2 (two) times daily. 180 tablet 1   loperamide (IMODIUM) 2 MG capsule 2 mg daily as needed for diarrhea or loose stools. Per tube     Multiple Minerals-Vitamins (CAL-MAG-ZINC-D PO) Take 1 tablet by mouth 2 (two) times daily.     Multiple Vitamin (MULTIVITAMIN WITH MINERALS) TABS tablet Place 1 tablet into feeding tube in the morning.     mupirocin ointment (BACTROBAN) 2 %      Nutritional Supplements (NUTREN 1.5) LIQD Place 237 mLs into feeding tube daily.     ondansetron (ZOFRAN-ODT) 4 MG disintegrating tablet DISSOLVE 1 TO 2 TABLETS(4 TO 8 MG) ON THE TONGUE EVERY 8 HOURS AS NEEDED FOR NAUSEA OR VOMITING 30 tablet 1   Polyethyl Glycol-Propyl Glycol (SYSTANE) 0.4-0.3 % GEL ophthalmic gel Place 1 application into both eyes 2 (two) times daily as needed (dry eyes).     prochlorperazine (COMPAZINE) 10 MG tablet TAKE 1 TABLET(10 MG) BY MOUTH EVERY 6 HOURS AS NEEDED FOR NAUSEA OR VOMITING 30 tablet 1   omeprazole (PRILOSEC) 40 MG capsule Take 1 capsule (40 mg total) by mouth daily. 90 capsule 3   Fluticasone-Umeclidin-Vilant (TRELEGY ELLIPTA) 100-62.5-25 MCG/ACT AEPB Inhale 1 puff into the lungs daily. (Patient not taking: Reported on 03/28/2023)     No facility-administered medications prior to visit.    Past Medical History:  Diagnosis Date   Abdominal pain 05/29/2013   s/p rx of cephalo resistant e coli   but last rx NG  now residular ?  bladder sx repeat cx sx rx to ty and uro consult    ADJ DISORDER WITH MIXED ANXIETY & DEPRESSED MOOD 03/03/2010   Qualifier: Diagnosis of  By: Fabian Sharp MD, Neta Mends    Agent resistant to multiple antibiotics 05/29/2013   e coli   bu NG on fu.     Anemia    Anxiety    ARF (acute renal  failure) (HCC) 03/12/2015   Closed head injury 02/01/2011   from syncope and had scalp laceration  neg ct .     Closed head injury 5-6 yrs ago   Colitis 11/27/2017   Complication of anesthesia    migraine several hours after general anesthesia   Depression    esophageal ca 01/2021   Fatty liver    Gall stones 2016   see ct scan neg HIDA    GERD (gastroesophageal reflux disease)    Hearing aid worn    HOH (hard of hearing)    both ears   Hyperlipidemia    Hypertension    echo nl lv function  mild dilitation 2009   Kidney infection    few yrs ago in hospital   Medication side effect 09/02/2010   Poss muscle se of  10 crestor    Migraine    hypnic HA eval by Dr. 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    Pyelonephritis 03/12/2015   Sensation of pain in anesthetized distribution of trigeminal nerve    Syncope 02/01/2011   In shower on vacation  sustained head laceration  8 sutures Had ed visit neg head ct labs and x ray    Trigeminal neuralgia pain       Objective:     BP 128/69 (BP Location: Left Arm, Cuff Size: Normal)   Pulse (!) 54   Temp 97.8 F (36.6 C) (Oral)   Ht 5\' 6"  (1.676 m)   Wt 134 lb (60.8 kg)   SpO2 97%   BMI 21.63 kg/m   SpO2: 97 % RA   Amb pleasant wf nad / relatively good voice texture    HEENT : Oropharynx  clear      Nasal turbinates nl    NECK :  without  apparent JVD/ palpable Nodes/TM    LUNGS: no acc muscle use,  Nl contour chest which is clear to A and P bilaterally without cough on insp or exp maneuvers   CV:  RRR  no s3 or murmur or increase in P2, and no edema   ABD:  soft and nontender with nl inspiratory excursion in the supine position. No bruits or organomegaly appreciated   MS:  Nl gait/ ext warm without deformities Or obvious joint restrictions  calf tenderness, cyanosis or clubbing    SKIN: warm and dry without lesions    NEURO:  alert, approp, nl sensorium with  no motor or cerebellar  deficits apparent.    I personally reviewed images and agree with radiology impression as follows:   Chest CT with contrast    11/26//24  1. No mass or abnormality to correspond to two areas of clinical concern marked bilaterally.  2. Stable and satisfactory post treatment appearance of the cervical esophagus. No metastatic disease identified in the Neck.    Rec cxr 04/19/2023 but not done     Assessment   Upper airway cough syndrome Onset fall 2022 with dx of es ca sq cell cervical / [facility]  and worse p completed RX including RT -  Chest CT with contrast    11/26//24  1. No mass or abnormality to correspond to two areas of clinical concern marked bilaterally.  2. Stable and satisfactory post treatment appearance of the cervical esophagus. No metastatic disease identified in the Neck.   - 04/19/2023 rec max gerd rx and re-eval upper airway per  ENT   Notes reports feeling better on Trelegy but no worse off it  - typically UACS is worse on long term DPI's so would leave this off for now and just rx with saba prn:  Re SABA :  I spent extra time with pt today reviewing appropriate use of albuterol for prn use on exertion with the following points: 1) saba is for relief of sob that does not improve by walking a slower pace or resting but rather if the pt does not improve after trying this first. 2) If the pt is convinced, as many are, that saba helps recover from activity faster then it's easy to tell if this is the case by re-challenging : ie stop, take the inhaler, then p 5 minutes try the exact same activity (intensity of workload) that just caused the symptoms and see if  they are substantially diminished or not after saba 3) if there is an activity that reproducibly causes the symptoms, try the saba 15 min before the activity on alternate days   If in fact the saba really does help, then fine to continue to use it prn but advised may need to look closer at the maintenance regimen (for  now = nothing) being used to achieve better control of airways disease with exertion.   No need for PFTs if the upper airway looks ok by ENT eval and not finding she needs any saba.    >>>  The proper method of use, as well as anticipated side effects, of a metered-dose inhaler were discussed and demonstrated to the patient using teach back method.   >>> f/u in 6 weeks  with all meds in hand using a trust but verify approach to confirm accurate Medication  Reconciliation The principal here is that until we are certain that the  patients are doing what we've asked, it makes no sense to ask them to do more.          Each maintenance medication was reviewed in detail including emphasizing most importantly the difference between maintenance and prns and under what circumstances the prns are to be triggered using an action plan format where appropriate.  Total time for H and P, chart review, counseling, reviewing hfa device(s) and generating customized AVS unique to this office visit / same day charting =  45 min for  refractory respiratory  symptoms of uncertain etiology / new pt eval           Sandrea Hughs, MD 04/20/2023

## 2023-04-19 NOTE — Patient Instructions (Addendum)
Omeprazole 40 mg Take 30- 60 min before your first and last meals of the day and add Pepcid 20 mg an hour or so before bed  GERD (REFLUX)  is an extremely common cause of respiratory symptoms just like yours , many times with no obvious heartburn at all.    It can be treated with medication, but also with lifestyle changes including elevation of the head of your bed (ideally with 6 -8inch blocks under the headboard of your bed),  Smoking cessation, avoidance of late meals, excessive alcohol, and avoid fatty foods, chocolate, peppermint, colas, red wine, and acidic juices such as orange juice.  NO MINT OR MENTHOL PRODUCTS SO NO COUGH DROPS  USE SUGARLESS CANDY INSTEAD (Jolley ranchers or Stover's or Life Savers) or even ice chips will also do - the key is to swallow to prevent all throat clearing. NO OIL BASED VITAMINS - use powdered substitutes.  Avoid fish oil when coughing.    My office will be contacting you by phone for referral to ENT for a look at your throat   - if you don't hear back from my office within one week please call us back or notify us thru MyChart and we'll address it right away.    Only use your albuterol as a rescue medication to be used if you can't catch your breath by resting or doing a relaxed purse lip breathing pattern.  - The less you use it, the better it will work when you need it. - Ok to use up to 2 puffs  every 4 hours if you must but call for immediate appointment if use goes up over your usual need - Don't leave home without it !!  (think of it like the spare tire for your car)    Please schedule a follow up office visit in 6 weeks, call sooner if needed with all medications /inhalers/ solutions in hand so we can verify exactly what you are taking. This includes all medications from all doctors and over the counters   Late add:  did not go for cxr/ needs one on return to complete the w/u

## 2023-04-20 NOTE — Assessment & Plan Note (Addendum)
Onset fall 2022 with dx of es ca sq cell cervical / [facility]  and worse p completed RX including RT -  Chest CT with contrast    11/26//24  1. No mass or abnormality to correspond to two areas of clinical concern marked bilaterally.  2. Stable and satisfactory post treatment appearance of the cervical esophagus. No metastatic disease identified in the Neck.   - 04/19/2023 rec max gerd rx and re-eval upper airway per  ENT   Notes reports feeling better on Trelegy but no worse off it  - typically UACS is worse on long term DPI's so would leave this off for now and just rx with saba prn:  Re SABA :  I spent extra time with pt today reviewing appropriate use of albuterol for prn use on exertion with the following points: 1) saba is for relief of sob that does not improve by walking a slower pace or resting but rather if the pt does not improve after trying this first. 2) If the pt is convinced, as many are, that saba helps recover from activity faster then it's easy to tell if this is the case by re-challenging : ie stop, take the inhaler, then p 5 minutes try the exact same activity (intensity of workload) that just caused the symptoms and see if they are substantially diminished or not after saba 3) if there is an activity that reproducibly causes the symptoms, try the saba 15 min before the activity on alternate days   If in fact the saba really does help, then fine to continue to use it prn but advised may need to look closer at the maintenance regimen (for now = nothing) being used to achieve better control of airways disease with exertion.   No need for PFTs if the upper airway looks ok by ENT eval and not finding she needs any saba.    >>>  The proper method of use, as well as anticipated side effects, of a metered-dose inhaler were discussed and demonstrated to the patient using teach back method.   >>> f/u in 6 weeks  with all meds in hand using a trust but verify approach to confirm  accurate Medication  Reconciliation The principal here is that until we are certain that the  patients are doing what we've asked, it makes no sense to ask them to do more.          Each maintenance medication was reviewed in detail including emphasizing most importantly the difference between maintenance and prns and under what circumstances the prns are to be triggered using an action plan format where appropriate.  Total time for H and P, chart review, counseling, reviewing hfa device(s) and generating customized AVS unique to this office visit / same day charting =  45 min for  refractory respiratory  symptoms of uncertain etiology / new pt eval

## 2023-04-25 ENCOUNTER — Other Ambulatory Visit: Payer: Medicare Other

## 2023-04-25 NOTE — Progress Notes (Signed)
04/25/2023 Name: [name] MRN: [id number] DOB: 03-20-1941  Chief Complaint  Patient presents with   Hypertension   Medication Management    [name] is a 82 y.o. year old female who presented for a telephone visit.   They were referred to the pharmacist by their PCP for assistance in managing hypertension.    Subjective:  Care Team: Primary Care Provider: Madelin Headings, MD   Medication Access/Adherence  Current Pharmacy:  [facility] [id number] Ginette Otto, [place] - [address] AT [facility] [address] [place] [address] Phone: [phone] Fax: [phone]   Patient reports affordability concerns with their medications: No  Patient reports access/transportation concerns to their pharmacy: No  Patient reports adherence concerns with their medications:  No     Hypertension:  Current medications: Carvedilol 25mg  BID, Lasix 20mg  daily Medications previously tried: Amlodipine, Benazepril, Chlorthalidone, Clonidine, Metoprolol, Triamterene, HCTZ  Patient has a validated, automated, upper arm home BP cuff Current blood pressure readings readings: Noticed she gets elevated readings at home if she checks right after getting coffee, controlled at last OV Patient denies hypotensive s/sx including dizziness, lightheadedness.  Patient denies hypertensive symptoms including headache, chest pain, shortness of breath   Current physical activity: Goes to the gym MWF   Objective:  Lab Results  Component Value Date   HGBA1C 5.2 11/09/2021    Lab Results  Component Value Date   CREATININE 1.37 (H) 03/28/2023   BUN 33 (H) 03/28/2023   NA 140 03/28/2023   K 4.3 03/28/2023   CL 104 03/28/2023   CO2 31 03/28/2023    Lab Results  Component Value Date   CHOL 279 (H) 05/27/2019   HDL 118.80 05/27/2019   LDLCALC 145 (H) 05/27/2019   LDLDIRECT 135.9 10/16/2012   TRIG 78.0 05/27/2019   CHOLHDL 2 05/27/2019     Medications Reviewed Today     Reviewed by Sherrill Raring, RPH (Pharmacist) on 04/25/23 at 1034  Med List Status: <None>   Medication Order Taking? Sig Documenting Provider Last Dose Status Informant  acetaminophen (TYLENOL) 500 MG tablet [id number] No Take 500-1,000 mg by mouth every 4 (four) hours as needed for headache, mild pain or moderate pain. [provider] Taking Active   albuterol (VENTOLIN HFA) 108 (90 Base) MCG/ACT inhaler [id number] No INHALE 1 TO 2 PUFFS INTO THE LUNGS EVERY 6 HOURS AS NEEDED FOR WHEEZING OR SHORTNESS OF BREATH [name], Neta Mends, MD Taking Active   Calcium Carb-Cholecalciferol 600-20 MG-MCG TABS [id number] No Take by mouth. [provider] Taking Active   carvedilol (COREG) 25 MG tablet [id number] No TAKE 1 TABLET(25 MG) BY MOUTH TWICE DAILY WITH A MEAL Worthy Rancher B, FNP Taking Active   conjugated estrogens (PREMARIN) vaginal cream [id number] No Place 1 applicator vaginally once a week. [provider] Taking Active Spouse/Significant Other  famotidine (PEPCID) 20 MG tablet [id number]  One after supper Nyoka Cowden, MD  Active   fluticasone [facility]) 50 MCG/ACT nasal spray [id number] No Place 2 sprays into both nostrils as needed for allergies or rhinitis.  Patient taking differently: Place 2 sprays into both nostrils daily as needed for allergies or rhinitis.   [name], Neta Mends, MD Taking Active Spouse/Significant Other           Med Note Lenoria Farrier   Fri Apr 01, 2022 12:03 PM)    furosemide (LASIX) 20 MG tablet [id number] No TAKE 1 TABLET(20 MG) BY  MOUTH DAILY [name], Neta Mends, MD Taking Active   guaiFENesin-codeine 100-10 MG/5ML syrup [id number] No PLACE INTO FEEDING TUBE TWICE DAILY FOR COUGH Malachy Mood, MD Taking Active   lamoTRIgine (LAMICTAL) 100 MG tablet [id number] No Take 1 tablet (100 mg total) by mouth 2 (two) times daily. Butch Penny, NP Taking Active   loperamide (IMODIUM) 2 MG capsule [id number] No 2 mg daily  as needed for diarrhea or loose stools. Per tube [provider] Taking Active Spouse/Significant Other  Multiple Minerals-Vitamins (CAL-MAG-ZINC-D PO) [id number] No Take 1 tablet by mouth 2 (two) times daily. [provider] Taking Active Spouse/Significant Other  Multiple Vitamin (MULTIVITAMIN WITH MINERALS) TABS tablet [id number] No Place 1 tablet into feeding tube in the morning. [provider] Taking Active Spouse/Significant Other  mupirocin ointment (BACTROBAN) 2 % [id number] No  [provider] Taking Active   Nutritional Supplements (NUTREN 1.5) LIQD [id number] No Place 237 mLs into feeding tube daily. [provider] Taking Active Spouse/Significant Other  omeprazole (PRILOSEC) 40 MG capsule [id number]  Take 30- 60 min before your first and last meals of the day Nyoka Cowden, MD  Active   ondansetron (ZOFRAN-ODT) 4 MG disintegrating tablet [id number] No DISSOLVE 1 TO 2 TABLETS(4 TO 8 MG) ON THE TONGUE EVERY 8 HOURS AS NEEDED FOR NAUSEA OR VOMITING Malachy Mood, MD Taking Active   Polyethyl Glycol-Propyl Glycol (SYSTANE) 0.4-0.3 % GEL ophthalmic gel [id number] No Place 1 application into both eyes 2 (two) times daily as needed (dry eyes). [provider] Taking Active Spouse/Significant Other  prochlorperazine (COMPAZINE) 10 MG tablet [id number] No TAKE 1 TABLET(10 MG) BY MOUTH EVERY 6 HOURS AS NEEDED FOR NAUSEA OR VOMITING Malachy Mood, MD Taking Active Spouse/Significant Other              Assessment/Plan:   Hypertension: - Currently controlled - Reviewed long term cardiovascular and renal outcomes of uncontrolled blood pressure - Reviewed appropriate blood pressure monitoring technique and reviewed goal blood pressure. Recommended to check home blood pressure and heart rate 2-3x/week - Recommend to continue current medication therapy  -Counseled to be mindful of caffeine/sodium intake    Follow Up Plan: Not indicated at this time,  reminded to contact pharmacist with any future questions/concerns  Sherrill Raring, PharmD Clinical Pharmacist [phone]

## 2023-05-01 ENCOUNTER — Other Ambulatory Visit: Payer: Self-pay | Admitting: Internal Medicine

## 2023-05-02 ENCOUNTER — Other Ambulatory Visit (HOSPITAL_COMMUNITY): Payer: Self-pay | Admitting: Otolaryngology

## 2023-05-02 ENCOUNTER — Encounter (INDEPENDENT_AMBULATORY_CARE_PROVIDER_SITE_OTHER): Payer: Self-pay | Admitting: Otolaryngology

## 2023-05-02 ENCOUNTER — Ambulatory Visit (INDEPENDENT_AMBULATORY_CARE_PROVIDER_SITE_OTHER): Payer: Medicare Other | Admitting: Otolaryngology

## 2023-05-02 VITALS — BP 133/80 | HR 67 | Ht 66.0 in | Wt 130.0 lb

## 2023-05-02 DIAGNOSIS — R1310 Dysphagia, unspecified: Secondary | ICD-10-CM

## 2023-05-02 DIAGNOSIS — J3089 Other allergic rhinitis: Secondary | ICD-10-CM

## 2023-05-02 DIAGNOSIS — C153 Malignant neoplasm of upper third of esophagus: Secondary | ICD-10-CM

## 2023-05-02 DIAGNOSIS — R0982 Postnasal drip: Secondary | ICD-10-CM | POA: Diagnosis not present

## 2023-05-02 DIAGNOSIS — J342 Deviated nasal septum: Secondary | ICD-10-CM

## 2023-05-02 DIAGNOSIS — R053 Chronic cough: Secondary | ICD-10-CM | POA: Diagnosis not present

## 2023-05-02 DIAGNOSIS — Z923 Personal history of irradiation: Secondary | ICD-10-CM

## 2023-05-02 DIAGNOSIS — K219 Gastro-esophageal reflux disease without esophagitis: Secondary | ICD-10-CM

## 2023-05-02 DIAGNOSIS — K222 Esophageal obstruction: Secondary | ICD-10-CM

## 2023-05-02 DIAGNOSIS — R059 Cough, unspecified: Secondary | ICD-10-CM

## 2023-05-02 MED ORDER — FLUTICASONE PROPIONATE 50 MCG/ACT NA SUSP: 2.0000 | Freq: Two times a day (BID) | NASAL | 6 refills | Status: AC

## 2023-05-02 NOTE — Patient Instructions (Addendum)
-   schedule swallow study (MBS and esophagram) - start Flonase twice daily   [email or web address] - check out this website to learn more about reflux   -Avoid lying down for at least two hours after a meal or after drinking acidic beverages, like soda, or other caffeinated beverages. This can help to prevent stomach contents from flowing back into the esophagus. -Keep your head elevated while you sleep. Using an extra pillow or two can also help to prevent reflux. -Eat smaller and more frequent meals each day instead of a few large meals. This promotes digestion and can aid in preventing heartburn. -Wear loose-fitting clothes to ease pressure on the stomach, which can worsen heartburn and reflux. -Reduce excess weight around the midsection. This can ease pressure on the stomach. Such pressure can force some stomach contents back up the esophagus  - Take Reflux Gourmet (natural supplement available on [employer]) to help with symptoms of chronic throat irritation

## 2023-05-02 NOTE — Progress Notes (Signed)
ENT CONSULT:  Reason for Consult: chronic cough  and dysphagia, hx of esophageal cancer   HPI:  Discussed the use of AI scribe software for clinical note transcription with the patient, who gave verbal consent to proceed.  History of Present Illness   The patient is an 82 yoF with a history of esophageal cancer in remission, presents with a chronic cough and swallowing difficulties. The initial symptom leading to the cancer diagnosis was difficulty swallowing, which was initially attributed to a side effect of oxcarbazepine, a seizure medication. The cancer was discovered following a swallow test.  Post-treatment, the patient has continued to experience swallowing difficulties, requiring small bites, thorough chewing, and the presence of liquid or moisture-rich food. A feeding tube was removed in September 2023, and an esophageal dilation procedure was performed in November 2023, which improved swallowing. However, the patient still experiences occasional gagging when food gets stuck in the throat. Pills have to be chewed or crushed in order to swallow them.   The patient's cough is unproductive and occurs two to three times a day. The patient has been advised to swallow rather than cough, which has reduced the frequency of the cough. The patient also reports a history of shortness of breath, particularly when lying down, but this symptom has not occurred recently. Already seen Pulm and PCP for dyspnea before sx resolved.   The patient has a history of reflux symptoms, for which they take omeprazole 40mg  twice a day and Pepcid at night. They also report seasonal allergies, sneezing and use nasal sprays as needed.   The patient has noticed changes in their voice, including a lower pitch and inability to sing despite having perfect pitch. The patient's diet is unrestricted but must be consumed carefully due to swallowing difficulties. The patient has no history of heart or lung disease or smoking.        Records Reviewed:  Office visit with Dr Sherene Sires 04/19/23  Brief patient profile:  82 yowf  never smoker referred to pulmonary clinic 04/19/2023 by Dr Fabian Sharp for doe  onset mid summer 2024 assoc with dysphagia/ choking sensations    S/p throat ca RT /chemo  ENT  has to chew food and freq choking since rx.     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 EGD 9/15 and CT neck 02/05/21 showed 6 cm mass in the cervical/upper esophagus with possible local adenopathy. 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 Cliffton Asters felt she was not a surgical candidate.  -she received concurrent chemoRT with carbo/taxol 03/08/21 - 04/14/21.  -given PDL1+, she began immunotherapy with Nivolumab on 05/21/21, and completed 1 year therapy in December 2023. -last EGD in November 2023 showed benign stenosis, no evidence of recurrence or residual disease. -restaging CT CAP on 07/27/2022 showed no evidence of disease. --12/06/2022 Ct Cap showed no evidence of metastatic disease. Recommended repeat CT Cap in 1 year (11/2023). Has been clinically doing well.        History of Present Illness  04/19/2023  Pulmonary/ 1st office eval/[facility]      Chief Complaint  Patient presents with   pulmonary consult      SOB improved with trelegy.  Non prod cough.   Dyspnea:  able to do 20 min  on ex machine building up speed  Cough: no cough on wakening  in am / more in daytime/ non productive  no better on vs off trelegy  Sleep: 30 deg hob elevated / one pillow off trelegy for a month cannot tell the difference  SABA use: not using    No obvious day to day or daytime pattern/variability or assoc excess/ purulent sputum or mucus plugs or hemoptysis or cp or chest tightness, subjective wheeze or overt sinus or hb symptoms.     Also denies any obvious fluctuation of symptoms with  weather or environmental changes or other aggravating or alleviating factors except as outlined above    No unusual exposure hx or h/o childhood pna/ asthma or knowledge of premature birth.  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 No recent weight loss or deterioration in symptoms. Last dilation was on November 14th 2023.  Discussed the risks/benefits of further dilation (potential for damage vs potential for improved swallowing). -Continue current management. -Consider further dilation if symptoms worsen.   Respiratory Issues Resolved with Trelegy as per Dr. Fabian Sharp. -Continue Trelegy as prescribed by Dr. Fabian Sharp  Office visit with PCP 01/17/23 Shortness of Breath                  HPI: Brenya Fromer 82 y.o. come in for sx as above.  Describes some progressive shortness of breath "cannot get her breath" at night when she lays down and sometimes earlier than that.  The symptoms she states are relieved by albuterol use.  She takes albuterol many nights but not every night Mostly at night and now in day .    Coughing  from throat.  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  Recently PPI was changed by Dr. Leone Payor. Feels wheezing in upper airway.  And no specific chest pain laying down is a trigger some nights will sleep propped up.Albuterol  fairly quickly and helps a lot.  No hx of same.  Asthma allergy chronic lung disease. Had chest and abdomen CT for staging in July no mention of the lung abnormalities but mild cardiomegaly. Denies increase in edema swelling. No change in voice from post rx baseline   Past Medical History:  Diagnosis Date   Abdominal pain 05/29/2013   s/p rx of cephalo resistant e coli   but last rx NG  now  residular ?  bladder sx repeat cx sx rx to ty and uro consult    ADJ DISORDER WITH MIXED ANXIETY & DEPRESSED MOOD 03/03/2010   Qualifier: Diagnosis of  By: Fabian Sharp MD, Neta Mends    Agent resistant to multiple antibiotics 05/29/2013   e coli   bu NG on fu.     Anemia    Anxiety    ARF (acute renal failure) (HCC) 03/12/2015   Closed head injury 02/01/2011   from syncope and had scalp laceration  neg ct .     Closed head injury 5-6 yrs ago   Colitis 11/27/2017   Complication of anesthesia    migraine several hours after general anesthesia   Depression    esophageal ca 01/2021   Fatty liver    Gall stones 2016   see ct scan neg HIDA    GERD (gastroesophageal reflux disease)    Hearing aid worn    HOH (hard of hearing)    both ears   Hyperlipidemia  Hypertension    echo nl lv function  mild dilitation 2009   Kidney infection    few yrs ago in hospital   Medication side effect 09/02/2010   Poss muscle se of 10 crestor    Migraine    hypnic HA eval by Dr. 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    Pyelonephritis 03/12/2015   Sensation of pain in anesthetized distribution of trigeminal nerve    Syncope 02/01/2011   In shower on vacation  sustained head laceration  8 sutures Had ed visit neg head ct labs and x ray    Trigeminal neuralgia pain     Past Surgical History:  Procedure Laterality Date   ABDOMINAL HYSTERECTOMY  2002   tubal   BACK SURGERY     2 times, for sciatic nerve pain   BALLOON DILATION  06/21/2021   Procedure: DILATION BALLOON used for esophageal stricture;  Surgeon: Lemar Lofty., MD;  Location: [facility];  Service: Gastroenterology;;   CARDIAC CATHETERIZATION  2000   chest pains neg   CHOLECYSTECTOMY N/A 02/21/2017   Procedure: LAPAROSCOPIC CHOLECYSTECTOMY WITH INTRAOPERATIVE CHOLANGIOGRAM;  Surgeon: Darnell Level, MD;  Location: [facility];  Service: General;  Laterality: N/A;   COLONOSCOPY     multiple   CRANIOTOMY   12/09/2011   nerve decompression right trigeminal    DIRECT LARYNGOSCOPY N/A 02/12/2021   Procedure: DIRECT LARYNGOSCOPY AND BIOPSY POSSIBLE FROZEN;  Surgeon: Drema Halon, MD;  Location: [facility];  Service: ENT;  Laterality: N/A;   DOPPLER ECHOCARDIOGRAPHY  2009   nl lv function mild lv dilitation   ESOPHAGOGASTRODUODENOSCOPY (EGD) WITH PROPOFOL N/A 06/21/2021   Procedure: ESOPHAGOGASTRODUODENOSCOPY (EGD) WITH PROPOFOL;  Surgeon: Lemar Lofty., MD;  Location: [facility];  Service: Gastroenterology;  Laterality: N/A;  Request Fluoroscopy; Plan in for dilation   ESOPHAGOGASTRODUODENOSCOPY (EGD) WITH PROPOFOL N/A 07/19/2021   Procedure: ESOPHAGOGASTRODUODENOSCOPY (EGD) WITH PROPOFOL;  Surgeon: Meridee Score Netty Starring., MD;  Location: [facility];  Service: Endoscopy;  Laterality: N/A;  Dilation fluoro   ESOPHAGOGASTRODUODENOSCOPY (EGD) WITH PROPOFOL N/A 08/05/2021   Procedure: ESOPHAGOGASTRODUODENOSCOPY (EGD) WITH PROPOFOL - fluoro;  Surgeon: Meridee Score Netty Starring., MD;  Location: [facility];  Service: Gastroenterology;  Laterality: N/A;   ESOPHAGOGASTRODUODENOSCOPY (EGD) WITH PROPOFOL N/A 08/23/2021   Procedure: ESOPHAGOGASTRODUODENOSCOPY (EGD) WITH PROPOFOL;  Surgeon: Iva Boop, MD;  Location: [facility];  Service: Gastroenterology;  Laterality: N/A;  Using Fluoroscopy and esophageal dilation   ESOPHAGOGASTRODUODENOSCOPY (EGD) WITH PROPOFOL N/A 09/22/2021   Procedure: ESOPHAGOGASTRODUODENOSCOPY (EGD) WITH PROPOFOL;  Surgeon: Iva Boop, MD;  Location: [facility];  Service: Gastroenterology;  Laterality: N/A;  EGD with savary and flour   ESOPHAGOGASTRODUODENOSCOPY (EGD) WITH PROPOFOL N/A 12/07/2021   Procedure: ESOPHAGOGASTRODUODENOSCOPY (EGD) WITH PROPOFOL;  Surgeon: Iva Boop, MD;  Location: [facility];  Service: Gastroenterology;  Laterality: N/A;  Schedule this with Fluroscopy, it is a savary dilation   ESOPHAGOGASTRODUODENOSCOPY (EGD) WITH  PROPOFOL N/A 04/05/2022   Procedure: ESOPHAGOGASTRODUODENOSCOPY (EGD) WITH PROPOFOL;  Surgeon: Iva Boop, MD;  Location: [facility];  Service: Gastroenterology;  Laterality: N/A;  EGD /savary dilation with fluoro   EYE SURGERY Bilateral    ioc for catatracts   GASTROSTOMY N/A 02/26/2021   Procedure: OPEN GASTROSTOMY TUBE PLACEMENT;  Surgeon: Darnell Level, MD;  Location: [facility];  Service: General;  Laterality: N/A;   IR GASTROSTOMY TUBE MOD SED  02/22/2021   IR GASTROSTOMY TUBE REMOVAL  02/10/2022  IR REPLACE G-TUBE SIMPLE WO FLUORO  09/07/2021   laparoscopic gallbladder surgery  02/16/2017   Fax from [facility]   OOPHORECTOMY Bilateral 2002   rt shoulder surgery     SAVORY DILATION N/A 06/21/2021   Procedure: SAVORY DILATION;  Surgeon: Lemar Lofty., MD;  Location: [facility];  Service: Gastroenterology;  Laterality: N/A;   SAVORY DILATION N/A 07/19/2021   Procedure: SAVORY DILATION;  Surgeon: Meridee Score Netty Starring., MD;  Location: [facility];  Service: Endoscopy;  Laterality: N/A;   SAVORY DILATION N/A 08/05/2021   Procedure: SAVORY DILATION;  Surgeon: Meridee Score Netty Starring., MD;  Location: [facility];  Service: Gastroenterology;  Laterality: N/A;   SAVORY DILATION N/A 08/23/2021   Procedure: SAVORY DILATION;  Surgeon: Iva Boop, MD;  Location: [facility];  Service: Gastroenterology;  Laterality: N/A;   SAVORY DILATION N/A 09/22/2021   Procedure: SAVORY DILATION;  Surgeon: Iva Boop, MD;  Location: [facility];  Service: Gastroenterology;  Laterality: N/A;   SAVORY DILATION N/A 12/07/2021   Procedure: SAVORY DILATION;  Surgeon: Iva Boop, MD;  Location: [facility];  Service: Gastroenterology;  Laterality: N/A;   SAVORY DILATION N/A 04/05/2022   Procedure: SAVORY DILATION;  Surgeon: Iva Boop, MD;  Location: [facility];  Service: Gastroenterology;  Laterality: N/A;    Family History  Problem Relation Age of Onset   Cancer  Mother        uterine   Ovarian cancer Mother    Stroke Mother    Alcohol abuse Father    Stroke Father    Diabetes Brother    Cancer Paternal Aunt        leukemia, unknown type   Seizures Daughter    Hypertension Other    Colon cancer Neg Hx    Dementia Neg Hx    Alzheimer's disease Neg Hx     Social History:  reports that she has never smoked. She has never used smokeless tobacco. She reports current alcohol use. She reports that she does not use drugs.  Allergies:  Allergies  Allergen Reactions   Other Rash   Bactrim [Sulfamethoxazole-Trimethoprim] Hives and Itching   Hydrocodone Other (See Comments)    Rebound headaches   Sulfa Antibiotics Other (See Comments)    Medications: I have reviewed the patient's current medications.  The PMH, PSH, Medications, Allergies, and SH were reviewed and updated.  ROS: Constitutional: Negative for fever, weight loss and weight gain. Cardiovascular: Negative for chest pain and dyspnea on exertion. Respiratory: Is not experiencing shortness of breath at rest. Gastrointestinal: Negative for nausea and vomiting. Neurological: Negative for headaches. Psychiatric: The patient is not nervous/anxious  Blood pressure 133/80, pulse 67, height 5\' 6"  (1.676 m), weight 130 lb (59 kg), SpO2 99%.  PHYSICAL EXAM:  Exam: General: Well-developed, well-nourished Communication and Voice: raspy and strained Respiratory Respiratory effort: Equal inspiration and expiration without stridor Cardiovascular Peripheral Vascular: Warm extremities with equal color/perfusion Eyes: No nystagmus with equal extraocular motion bilaterally Neuro/Psych/Balance: Patient oriented to person, place, and time; Appropriate mood and affect; Gait is intact with no imbalance; Cranial nerves I-XII are intact Head and Face Inspection: Normocephalic and atraumatic without mass or lesion Palpation: Facial skeleton intact without bony stepoffs Salivary Glands: No mass or  tenderness Facial Strength: Facial motility symmetric and full bilaterally ENT Pinna: External ear intact and fully developed External canal: Canal is patent with intact skin Tympanic Membrane: Clear and mobile External Nose: No scar or anatomic deformity Internal Nose: Septum is deviated  to the left. No polyp, or purulence. Mucosal edema and erythema present.  Bilateral inferior turbinate hypertrophy.  Lips, Teeth, and gums: Mucosa and teeth intact and viable TMJ: No pain to palpation with full mobility Oral cavity/oropharynx: No erythema or exudate, no lesions present Nasopharynx: No mass or lesion with intact mucosa Hypopharynx: Intact mucosa without pooling of secretions, post-XRT changes  Larynx Glottic: Full true vocal cord mobility without lesion or mass, b/l VF atrophy Supraglottic: Normal appearing epiglottis and AE folds Interarytenoid Space: Moderate pachydermia&edema  Subglottic Space: Patent without lesion or edema Neck Neck and Trachea: Midline trachea without mass or lesion Thyroid: No mass or nodularity Lymphatics: No lymphadenopathy  Procedure:  Findings: blunting of pyriform sinuses and post-XRT fibrosis changes, no pooling of secretions. B/l VF with evidence of atrophy but normal mobility b/l, moderate post-cricoid edema  Preoperative diagnosis: dysphagia chronic cough hx of upper esophageal cancer and radiation induced stricture of esophagus   Postoperative diagnosis:   Same   Procedure: Flexible fiberoptic laryngoscopy  Surgeon: Ashok Croon, MD  Anesthesia: Topical lidocaine and Afrin Complications: None Condition is stable throughout exam  Indications and consent:  The patient presents to the clinic with Indirect laryngoscopy view was incomplete. Thus it was recommended that they undergo a flexible fiberoptic laryngoscopy. All of the risks, benefits, and potential complications were reviewed with the patient preoperatively and verbal informed consent  was obtained.  Procedure: The patient was seated upright in the clinic. Topical lidocaine and Afrin were applied to the nasal cavity. After adequate anesthesia had occurred, I then proceeded to pass the flexible telescope into the nasal cavity. The nasal cavity was patent without rhinorrhea or polyp. The nasopharynx was also patent without mass or lesion. The base of tongue was visualized and was normal. There were no signs of pooling of secretions in the piriform sinuses. The true vocal folds were mobile bilaterally. There were no signs of glottic or supraglottic mucosal lesion or mass. There was moderate interarytenoid pachydermia and post cricoid edema. The telescope was then slowly withdrawn and the patient tolerated the procedure throughout.     Studies Reviewed: CT neck 04/18/23 CLINICAL DATA:  82 year old female with cervical esophageal cancer. Post treatment restaging. "Superficial swelling".   EXAM: CT NECK WITH CONTRAST   TECHNIQUE: Multidetector CT imaging of the neck was performed using the standard protocol following the bolus administration of intravenous contrast.   RADIATION DOSE REDUCTION: This exam was performed according to the departmental dose-optimization program which includes automated exposure control, adjustment of the mA and/or kV according to patient size and/or use of iterative reconstruction technique.   CONTRAST:  65mL OMNIPAQUE IOHEXOL 300 MG/ML  SOLN   COMPARISON:  Neck CT 06/18/2021.  More recent chest CT 12/06/2022.   FINDINGS: Pharynx and larynx: There in G ule and pharyngeal soft tissue contours are stable and within normal limits. Negative parapharyngeal and retropharyngeal spaces.   Stable to mildly improved appearance of the cervical esophagus, with residual circumferential wall thickening on series 2, images 83 through 91. No discrete or masslike hyperenhancement. The upper thoracic esophagus appears stable and has a more normal  appearance.   Salivary glands: Stable. Partial submandibular gland atrophy. Negative sublingual space.   Thyroid: Stable, negative for age.   Lymph nodes: No cervical lymphadenopathy.   Two marked areas of clinical concern on the skin surface correspond to the left caudal margin of the parotid gland (sagittal image 78), and the contralateral right submandibular space (series 2, image 59). There are mildly  tortuous venous vessels located at both sites. No mass or lymphadenopathy at either site.   Vascular: Chronic cervical carotid atherosclerosis worse on the right side. Major vascular structures in the neck and at the skull base remain patent. No high-grade arterial stenosis identified.   Limited intracranial: Stable. Chronic right suboccipital craniotomy. Chronic right cerebellopontine angle hyperdensity, stable and thought related to previous trigeminal nerve decompression on previous MRI.   Visualized orbits: Chronic postoperative changes to both globes.   Mastoids and visualized paranasal sinuses: Visualized paranasal sinuses and mastoids are stable and well aerated.   Skeleton: Chronic right suboccipital craniotomy. Subtle discontinuity of the left C2 superior articulating surface now on series 6, image 63 corresponds to an area of chronic smooth bone remottling in the setting of highly tortuous underlying left vertebral artery, chronic thinning of the bone there. See series 7, image 60 and compare to previous series 7, image 58 1 year ago. C1-C2 alignment is maintained. Other chronic cervical spine degeneration appears stable. No suspicious or destructive osseous lesion is identified in the neck.   Upper chest: Stable compared to July chest CT. Mild medial left upper lobe lung scarring. Calcified aortic atherosclerosis. No superior mediastinal lymphadenopathy or upper lung nodule is identified.   IMPRESSION: 1. No mass or abnormality to correspond to two areas of  clinical concern marked bilaterally.   2. Stable and satisfactory post treatment appearance of the cervical esophagus. No metastatic disease identified in the Neck.   3. Positive for a subtle degenerative appearing fracture of the left C2 vertebra articular surface with C1. There is chronic underlying remodeling of the bone there from tortuous left vertebral artery. This fracture is new from January 2023 but age indeterminate and has a benign appearance. This might predispose to accelerated arthritis there, but C1-C2 alignment remains normal.   4. Aortic Atherosclerosis (ICD10-I70.0). Carotid atherosclerosis. Chronic postoperative changes to the cerebral posterior fossa.  CT chest A/P for cancer re-staging 12/06/22 FINDINGS: CT CHEST FINDINGS   Cardiovascular: Aortic atherosclerosis. Tortuous thoracic aorta. Mild cardiomegaly, without pericardial effusion. Left main coronary artery calcification. No central pulmonary embolism, on this non-dedicated study.   Mediastinum/Nodes: No supraclavicular adenopathy. No mediastinal or hilar adenopathy. No residual or recurrent esophageal mass.   Lungs/Pleura: No pleural fluid.  Clear lungs.   Musculoskeletal: No acute osseous abnormality.   CT ABDOMEN PELVIS FINDINGS   Hepatobiliary: Normal liver. Cholecystectomy, without biliary ductal dilatation.   Pancreas: Pancreatic atrophy. No duct dilatation or acute inflammation.   Spleen: Too small to characterize posterior splenic hypoattenuating lesion is similar and of doubtful clinical significance.   Adrenals/Urinary Tract: Normal adrenal glands. Right larger than left simple renal cysts do not warrant imaging follow-up. Maximally 3.6 cm. No hydronephrosis. The bladder wall is borderline thickened with possible subtle pericystic edema including anteriorly on 109/2.   Stomach/Bowel: Normal stomach, without wall thickening. Scattered colonic diverticula. Normal terminal ileum. Normal  small bowel.   Vascular/Lymphatic: Aortic atherosclerosis. No abdominopelvic adenopathy.   Reproductive: Hysterectomy.  No adnexal mass.   Other: Trace pelvic fluid including on 106/2. No abdominal ascites. No evidence of omental or peritoneal disease.   Musculoskeletal: Osteopenia.  Trace L4-5 anterolisthesis.   IMPRESSION: 1. No acute process or evidence of metastatic disease within the chest, abdomen, or pelvis. 2. Trace pelvic fluid, either new or increased since 04/06/2022. Nonspecific. 3. Possible bladder wall thickening and pericystic edema. Correlate with symptoms of cystitis. 4. Incidental findings, including: Coronary artery atherosclerosis. Aortic Atherosclerosis (ICD10-I70.0).    EGD upper  endoscopy 04/05/22   Assessment/Plan: Encounter Diagnoses  Name Primary?   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 Reports being able to eat and drink but has to chew things very thoroughly and drink water with food, crushes pills. No weight loss or aspiration PNA hx.  Exam today including flexible scope blunting of pyriform sinuses and post-XRT fibrosis changes, no pooling of secretions. B/l VF with evidence of atrophy but normal mobility b/l, moderate post-cricoid edema - MBS esophagram to evaluate swallowing - will consider swallow therapy if indicated based on results     Chronic Cough Chronic,  unproductive cough likely multifactorial, with contributions from reflux, postnasal drainage, and radiation-induced changes. Could be related to dysphagia, but unclear, and exam with no evidence of pooling of secretions in hypopharynx. Improved with reflux management initiated with Dr Sherene Sires. Discussed risks and benefits of cough-reducing therapy, including potential for improved quality of life and reduced cough frequency. Patient prefers to try the swallow study first. Will consider in the future. Will optimize reflux control  - Order barium swallow test to rule out penetration and aspiration  - Recommend cough-reducing therapy if needed in the future - she would like to hold off for now - Prescribe Flonase, use twice daily 2 puffs b/l nares for hx of environmental allergies and post-nasal drip  - Provide resources on managing reflux-related cough - Recommend seaweed paste supplement, Reflux Gourmet, to be taken after meals and before bedtime - continue current GERD regimen, Prilosec 40 mg BID and Pepcid 20 mg QHS  Postnasal Drainage and hx of Environmental Allergies Seasonal postnasal drainage, managed with nasal sprays as needed. Potential contributor to chronic cough. Discussed benefits of regular Flonase use. - Prescribe Flonase, use twice daily, 2 puffs b/l nares   Gastroesophageal Reflux Disease (GERD) GERD managed with omeprazole 40 mg twice daily and Pepcid at night. Likely contributing to chronic cough. Discussed benefits of continued medication and adding seaweed paste supplement. 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 Difficulty swallowing, requiring small bites and liquids. Discussed risks and benefits of further esophageal dilation. Patient prefers to  avoid further dilation unless necessary. - Continue current management and follow-up with GI specialist as needed - Order barium swallow test to evaluate swallowing function - RTC after testing   General Health Maintenance Emphasized importance of staying active, maintaining oral hygiene, and continuing to eat despite swallowing difficulties to prevent weight loss and additional swallowing issues. - Encourage regular dental cleanings and good oral hygiene - Encourage staying active and maintaining a balanced diet  Follow-up - Schedule follow-up appointment after barium swallow test - Provide after-visit summary with instructions and resources discussed.      Thank you for allowing me  to participate in the care of this patient. Please do not hesitate to contact me with any questions or concerns.   Ashok Croon, MD Otolaryngology [facility] Phone: [phone] Fax: [phone]    05/02/2023, 9:19 AM

## 2023-05-22 ENCOUNTER — Encounter: Payer: Self-pay | Admitting: Internal Medicine

## 2023-05-23 ENCOUNTER — Ambulatory Visit (HOSPITAL_COMMUNITY)
Admission: RE | Admit: 2023-05-23 | Discharge: 2023-05-23 | Disposition: A | Discharge status: Home / Self Care | Payer: Medicare Other | Source: Ambulatory Visit | Attending: Internal Medicine | Admitting: Internal Medicine

## 2023-05-23 ENCOUNTER — Ambulatory Visit (HOSPITAL_COMMUNITY)
Admission: RE | Admit: 2023-05-23 | Discharge: 2023-05-23 | Disposition: A | Discharge status: Home / Self Care | Payer: Medicare Other | Source: Ambulatory Visit | Attending: Otolaryngology

## 2023-05-23 ENCOUNTER — Other Ambulatory Visit (INDEPENDENT_AMBULATORY_CARE_PROVIDER_SITE_OTHER): Payer: Self-pay | Admitting: Otolaryngology

## 2023-05-23 DIAGNOSIS — R0982 Postnasal drip: Secondary | ICD-10-CM

## 2023-05-23 DIAGNOSIS — Z923 Personal history of irradiation: Secondary | ICD-10-CM | POA: Diagnosis not present

## 2023-05-23 DIAGNOSIS — K224 Dyskinesia of esophagus: Secondary | ICD-10-CM | POA: Insufficient documentation

## 2023-05-23 DIAGNOSIS — K222 Esophageal obstruction: Secondary | ICD-10-CM

## 2023-05-23 DIAGNOSIS — Z8501 Personal history of malignant neoplasm of esophagus: Secondary | ICD-10-CM | POA: Diagnosis present

## 2023-05-23 DIAGNOSIS — R059 Cough, unspecified: Secondary | ICD-10-CM

## 2023-05-23 DIAGNOSIS — R053 Chronic cough: Secondary | ICD-10-CM | POA: Insufficient documentation

## 2023-05-23 DIAGNOSIS — R1310 Dysphagia, unspecified: Secondary | ICD-10-CM

## 2023-05-23 DIAGNOSIS — J342 Deviated nasal septum: Secondary | ICD-10-CM

## 2023-05-23 DIAGNOSIS — K219 Gastro-esophageal reflux disease without esophagitis: Secondary | ICD-10-CM

## 2023-05-23 DIAGNOSIS — C153 Malignant neoplasm of upper third of esophagus: Secondary | ICD-10-CM

## 2023-05-23 DIAGNOSIS — J3089 Other allergic rhinitis: Secondary | ICD-10-CM

## 2023-05-23 NOTE — Therapy (Signed)
 Modified Barium Swallow Study  Patient Details  Name: [name] MRN: [id number] Date of Birth: 08/23/1940  Today's Date: 05/23/2023  Modified Barium Swallow completed.  Full report located under Chart Review in the Imaging Section.  History of Present Illness [name] is an 82 y.o. female with PMH: esophageal cancer s/p radiation 2022, closed head injury, anxiety, anemia, depression,, GERD, trigeminal neuralgia pain, HTN, HLD. She presented to this OP MBS, referred by ENT due to c/o chronic cough and swallowing difficulties. She had a feeding tube which was removed in September of 2023. Esophageal dilation procedure performed in November of 2023. She describes a sensation of something in her throat, occasional gagging when food is stuck in her throat and a prior h/o inability to breathe when laying flat. She takes Omeprozale for her GERD which she reports does help.   Clinical Impression [name] is presenting with a functional oral and pharyngeal swallow as per this MBS. No penetration or aspiration and only trace vallecular residuals obseved.(which cleared with subsequent swallows) As study progressed, barium stasis/slow moving barium observed in upper esophagus below PES but no retrograde movement of barium observed. SLP suspects her dysphagia is primarily esophageal in nature. She is getting an esophagram after this MBS. Factors that may increase risk of adverse event in presence of aspiration [name] 2021):    Swallow Evaluation Recommendations Recommendations: PO diet PO Diet Recommendation: Regular;Thin liquids (Level 0) Liquid Administration via: Cup;Straw Medication Administration: Whole meds with liquid Supervision: Patient able to self-feed Postural changes: Position pt fully upright for meals;Stay upright 30-60 min after meals     Angela IVAR Blase, MA, CCC-SLP Speech Therapy

## 2023-05-24 HISTORY — PX: GAMMETE INTRAFALLOPIAN TUBE TRANFER: SHX1690

## 2023-06-04 NOTE — Telephone Encounter (Signed)
 So  not sure..  if  will be a problem for your esophagus condition. I f you having no swallowing issues now :   I would ask Dr Leone Payor input . There is a risk of   getting esophageal irritation again.  May need to consider other options .

## 2023-06-14 NOTE — Progress Notes (Signed)
[name], female    DOB: Jan 26, 1941   MRN: [id number]   Brief patient profile:  3 yowf  never smoker referred to pulmonary clinic 04/19/2023 by Dr [name] for doe  onset mid summer 2024 assoc with dysphagia/ choking sensations   S/p throat ca RT /chemo     has to chew food and freq choking since rx.   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 EGD 9/15 and CT neck 02/05/21 showed 6 cm mass in the cervical/upper esophagus with possible local adenopathy. 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 Cliffton Barrera felt she was not a surgical candidate.  -she received concurrent chemoRT with carbo/taxol 03/08/21 - 04/14/21.  -given PDL1+, she began immunotherapy with Nivolumab on 05/21/21, and completed 1 year therapy in December 2023. -last EGD in November 2023 showed benign stenosis, no evidence of recurrence or residual disease. -restaging CT CAP on 07/27/2022 showed no evidence of disease. --12/06/2022 Ct Cap showed no evidence of metastatic disease. Recommended repeat CT Cap in 1 year (11/2023). Has been clinically doing well.       History of Present Illness  04/19/2023  Pulmonary/ 1st office eval/[name]  Chief Complaint  Patient presents with   pulmonary consult    SOB improved with trelegy.  Non prod cough.   Dyspnea:  able to do 20 min  on ex machine building up speed  Cough: no cough on wakening  in am / more in daytime/ non productive  no better on vs off trelegy  Sleep: 30 deg hob elevated / one pillow off trelegy for a month cannot tell the difference  SABA use: not using  Rec Omeprazole 40 mg Take 30- 60 min before your first and last meals of the day and add Pepcid 20 mg an hour or so before bed GERD diet reviewed, bed blocks rec   Only use your albuterol as a rescue medication   Please schedule a follow up office visit  in 6 weeks, call sooner if needed with all medications /inhalers/ solutions in hand   Late add:  did not go for cxr/ needs one on return to complete the w/u   05/02/23 ent eval [name]  Exam today included flexible scope blunting of pyriform sinuses and post-XRT fibrosis changes, no pooling of secretions. B/l VF with evidence of atrophy but normal mobility b/l, moderate post-cricoid edema - MBS esophagram to evaluate swallowing - will consider swallow therapy if indicated based on results   05/23/23 MBS Mild esophageal dysmotility, otherwise normal esophagram. /  no dietary restricitons but stay upright p eating    06/15/2023  f/u ov/[name] re: UACS    maint on no resp rx  did  bring meds   Chief Complaint  Patient presents with   Follow-up    Doing well.  Does have cold sx this morning.  Runny nose and laryngitis.   Dyspnea:  20 min on ex machine 3 x weekly Cough: hoarse / helps to swallow water Sleeping: 30 degree bed  resp cc  SABA use: rarely  02: none      No obvious day to day or daytime variability or assoc  purulent sputum or mucus plugs or hemoptysis or cp or chest tightness, subjective wheeze or overt sinus or hb symptoms.    Also denies any obvious fluctuation of symptoms with weather or environmental changes or other aggravating  or alleviating factors except as outlined above   No unusual exposure hx or h/o childhood pna/ asthma or knowledge of premature birth.  Current Allergies, Complete Past Medical History, Past Surgical History, Family History, and Social History were reviewed in [employer] record.  ROS  The following are not active complaints unless bolded Hoarseness, sore throat, dysphagia, dental problems, itching, sneezing,  nasal congestion or discharge of excess mucus or purulent secretions, ear ache,   fever, chills, sweats, unintended wt loss or wt gain, classically pleuritic or exertional cp,  orthopnea pnd or arm/hand swelling  or  leg swelling, presyncope, palpitations, abdominal pain, anorexia, nausea, vomiting, diarrhea  or change in bowel habits or change in bladder habits, change in stools or change in urine, dysuria, hematuria,  rash, arthralgias, visual complaints, headache, numbness, weakness or ataxia or problems with walking or coordination,  change in mood or  memory.        Current Meds  Medication Sig   acetaminophen (TYLENOL) 500 MG tablet Take 500-1,000 mg by mouth every 4 (four) hours as needed for headache, mild pain or moderate pain.   albuterol (VENTOLIN HFA) 108 (90 Base) MCG/ACT inhaler INHALE 1 TO 2 PUFFS INTO THE LUNGS EVERY 6 HOURS AS NEEDED FOR WHEEZING OR SHORTNESS OF BREATH   Calcium Carb-Cholecalciferol 600-20 MG-MCG TABS Take by mouth.   carvedilol (COREG) 25 MG tablet TAKE 1 TABLET(25 MG) BY MOUTH TWICE DAILY WITH A MEAL   conjugated estrogens (PREMARIN) vaginal cream Place 1 applicator vaginally once a week.   estradiol (ESTRACE) 0.1 MG/GM vaginal cream APPLY 1/2 VAGINALLY AT BEDTIME TWICE WEEKLY   famotidine (PEPCID) 20 MG tablet One after supper   fluticasone (FLONASE) 50 MCG/ACT nasal spray Place 2 sprays into both nostrils 2 (two) times daily.   fosfomycin (MONUROL) 3 g PACK TAKE 1 PACKET BY MOUTH FOR 1 DOSE.   furosemide (LASIX) 20 MG tablet TAKE 1 TABLET(20 MG) BY MOUTH DAILY   guaiFENesin-codeine 100-10 MG/5ML syrup PLACE INTO FEEDING TUBE TWICE DAILY FOR COUGH (Patient taking differently: Take 5 mLs by mouth every 6 (six) hours as needed for cough.)   lamoTRIgine (LAMICTAL) 100 MG tablet Take 1 tablet (100 mg total) by mouth 2 (two) times daily.   loperamide (IMODIUM) 2 MG capsule 2 mg daily as needed for diarrhea or loose stools. Per tube   Multiple Minerals-Vitamins (CAL-MAG-ZINC-D PO) Take 1 tablet by mouth 2 (two) times daily.   Multiple Vitamin (MULTIVITAMIN WITH MINERALS) TABS tablet Place 1 tablet into feeding tube in the morning.   mupirocin ointment (BACTROBAN) 2 %     Nutritional Supplements (NUTREN 1.5) LIQD Place 237 mLs into feeding tube daily.   omeprazole (PRILOSEC) 40 MG capsule Take 30- 60 min before your first and last meals of the day   ondansetron (ZOFRAN-ODT) 4 MG disintegrating tablet DISSOLVE 1 TO 2 TABLETS(4 TO 8 MG) ON THE TONGUE EVERY 8 HOURS AS NEEDED FOR NAUSEA OR VOMITING   Polyethyl Glycol-Propyl Glycol (SYSTANE) 0.4-0.3 % GEL ophthalmic gel Place 1 application into both eyes 2 (two) times daily as needed (dry eyes).   prochlorperazine (COMPAZINE) 10 MG tablet TAKE 1 TABLET(10 MG) BY MOUTH EVERY 6 HOURS AS NEEDED FOR NAUSEA OR VOMITING   triamcinolone cream (KENALOG) 0.1 % Apply 1 Application topically 2 (two) times daily.           Past Medical History:  Diagnosis Date   Abdominal pain 05/29/2013   s/p rx of cephalo resistant e  coli   but last rx NG  now residular ?  bladder sx repeat cx sx rx to ty and uro consult    ADJ DISORDER WITH MIXED ANXIETY & DEPRESSED MOOD 03/03/2010   Qualifier: Diagnosis of  By: Angela Sharp MD, Neta Mends    Agent resistant to multiple antibiotics 05/29/2013   e coli   bu NG on fu.     Anemia    Anxiety    ARF (acute renal failure) (HCC) 03/12/2015   Closed head injury 02/01/2011   from syncope and had scalp laceration  neg ct .     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    Pyelonephritis 03/12/2015   Sensation of pain  in anesthetized distribution of trigeminal nerve    Syncope 02/01/2011   In shower on vacation  sustained head laceration  8 sutures Had ed visit neg head ct labs and x ray    Trigeminal neuralgia pain       Objective:    wts  Wt Readings from Last 3 Encounters:  06/15/23 134 lb (60.8 kg)  05/02/23 130 lb (59 kg)  04/19/23 134 lb (60.8 kg)    Vital signs reviewed  06/15/2023  - Note at rest 02 sats  96% on RA   General appearance:    hoarse amb wf nad    HEENT : Oropharynx  clear         NECK :  without  apparent JVD/ palpable Nodes/TM    LUNGS: no acc muscle use,  Nl contour chest which is clear to A and P bilaterally without cough on insp or exp maneuvers   CV:  RRR  no s3 or murmur or increase in P2, and no edema   ABD:  soft and nontender   MS:  Gait nl   ext warm without deformities Or obvious joint restrictions  calf tenderness, cyanosis or clubbing    SKIN: warm and dry without lesions    NEURO:  alert, approp, nl sensorium with  no motor or cerebellar deficits apparent.      CXR PA and Lateral:   06/15/2023 :    I personally reviewed images and impression is as follows:     Hyperinflated / no acute findings     Assessment

## 2023-06-15 ENCOUNTER — Encounter: Payer: Self-pay | Admitting: Internal Medicine

## 2023-06-15 ENCOUNTER — Ambulatory Visit: Payer: Medicare Other

## 2023-06-15 ENCOUNTER — Ambulatory Visit (INDEPENDENT_AMBULATORY_CARE_PROVIDER_SITE_OTHER): Payer: Medicare Other | Admitting: Internal Medicine

## 2023-06-15 VITALS — BP 114/70 | HR 56 | Temp 97.7°F | Ht 66.0 in | Wt 134.0 lb

## 2023-06-15 DIAGNOSIS — R058 Other specified cough: Secondary | ICD-10-CM

## 2023-06-15 NOTE — Patient Instructions (Signed)
Keep the candy handy  - NO MINT   Make sure you check your oxygen saturation  AT  your highest level of activity (not after you stop)   to be sure it stays over 90% and adjust  02 flow upward to maintain this level if needed but remember to turn it back to previous settings when you stop (to conserve your supply).   Work on inhaler technique:  relax and gently blow all the way out then take a nice smooth full deep breath back in, triggering the inhaler at same time you start breathing in.  Hold breath in for at least  5 seconds if you can   Please remember to go to the  x-ray department  for your tests - we will call you with the results when they are available    Pulmonary follow up is as needed

## 2023-06-17 NOTE — Assessment & Plan Note (Addendum)
Onset fall 2022 with dx of es ca sq cell cervical / [facility]  and worse p completed RX including RT -  Chest CT with contrast    11/26//24  1. No mass or abnormality to correspond to two areas of clinical concern marked bilaterally.  2. Stable and satisfactory post treatment appearance of the cervical esophagus. No metastatic disease identified in the Neck.   - 04/19/2023 rec max gerd rx and re-eval upper airway per  ENT  -05/02/23 ent eval [place]  Exam today included flexible scope blunting of pyriform sinuses and post-XRT fibrosis changes, no pooling of secretions. B/l VF with evidence of atrophy but normal mobility b/l, moderate post-cricoid edema - MBS esophagram to evaluate swallowing - will consider swallow therapy if indicated based on results  05/23/23 MBS Mild esophageal dysmotility, otherwise normal esophagram. /  no dietary restricitons but stay upright p eating   Well compensated on dietary restrictions and max gerd  rx with just saba prn for now   - since her problems are predominately upper airway in nature and she if being followed by ENT, pulmonary f/u can be prn          Each maintenance medication was reviewed in detail including emphasizing most importantly the difference between maintenance and prns and under what circumstances the prns are to be triggered using an action plan format where appropriate.  Total time for H and P, chart review, counseling, reviewing hfa  device(s) and generating customized AVS unique to this office visit / same day charting = 20 min

## 2023-06-20 ENCOUNTER — Ambulatory Visit: Payer: Medicare Other | Admitting: Dermatology

## 2023-06-20 ENCOUNTER — Encounter: Payer: Self-pay | Admitting: Dermatology

## 2023-06-20 VITALS — BP 134/75 | HR 60

## 2023-06-20 DIAGNOSIS — C44722 Squamous cell carcinoma of skin of right lower limb, including hip: Secondary | ICD-10-CM

## 2023-06-20 DIAGNOSIS — D492 Neoplasm of unspecified behavior of bone, soft tissue, and skin: Secondary | ICD-10-CM | POA: Diagnosis not present

## 2023-06-20 DIAGNOSIS — D485 Neoplasm of uncertain behavior of skin: Secondary | ICD-10-CM

## 2023-06-20 NOTE — Patient Instructions (Addendum)
Patient Handout: Wound Care for Skin Biopsy Site  Taking Care of Your Skin Biopsy Site  Proper care of the biopsy site is essential for promoting healing and minimizing scarring. This handout provides instructions on how to care for your biopsy site to ensure optimal recovery.  1. Cleaning the Wound:  Clean the biopsy site daily with gentle soap and water. Gently pat the area dry with a clean, soft towel. Avoid harsh scrubbing or rubbing the area, as this can irritate the skin and delay healing.  2. Applying Aquaphor and Bandage:  After cleaning the wound, apply a thin layer of Aquaphor ointment to the biopsy site. Cover the area with a sterile bandage to protect it from dirt, bacteria, and friction. Change the bandage daily or as needed if it becomes soiled or wet.  3. Continued Care for One Week:  Repeat the cleaning, Aquaphor application, and bandaging process daily for one week following the biopsy procedure. Keeping the wound clean and moist during this initial healing period will help prevent infection and promote optimal healing.  4. Massaging Aquaphor into the Area:  ---After one week, discontinue the use of bandages but continue to apply Aquaphor to the biopsy site. ----Gently massage the Aquaphor into the area using circular motions. ---Massaging the skin helps to promote circulation and prevent the formation of scar tissue.   Additional Tips:  Avoid exposing the biopsy site to direct sunlight during the healing process, as this can cause hyperpigmentation or worsen scarring. If you experience any signs of infection, such as increased redness, swelling, warmth, or drainage from the wound, contact your healthcare provider immediately. Follow any additional instructions provided by your healthcare provider for caring for the biopsy site and managing any discomfort. Conclusion:  Taking proper care of your skin biopsy site is crucial for ensuring optimal healing and  minimizing scarring. By following these instructions for cleaning, applying Aquaphor, and massaging the area, you can promote a smooth and successful recovery. If you have any questions or concerns about caring for your biopsy site, don't hesitate to contact your healthcare provider for guidance.    Skin Education : We  counseled the patient regarding the following: Sun screen (SPF 30 or greater) should be applied during peak UV exposure (between 10am and 2pm) and reapplied after exercise or swimming.  The ABCDEs of melanoma were reviewed with the patient, and the importance of monthly self-examination of moles was emphasized. Should any moles change in shape or color, or itch, bleed or burn, pt will contact our office for evaluation sooner then their interval appointment.  Plan: Sunscreen Recommendations We recommended a broad spectrum sunscreen with a SPF of 30 or higher.  SPF 30 sunscreens block approximately 97 percent of the sun's harmful rays. Sunscreens should be applied at least 15 minutes prior to expected sun exposure and then every 2 hours after that as long as sun exposure continues. If swimming or exercising sunscreen should be reapplied every 45 minutes to an hour after getting wet or sweating. One ounce, or the equivalent of a shot glass full of sunscreen, is adequate to protect the skin not covered by a bathing suit. We also recommended a lip balm with a sunscreen as well. Sun protective clothing can be used in lieu of sunscreen but must be worn the entire time you are exposed to the sun's rays. Important Information   Due to recent changes in healthcare laws, you may see results of your pathology and/or laboratory studies on MyChart before  the doctors have had a chance to review them. We understand that in some cases there may be results that are confusing or concerning to you. Please understand that not all results are received at the same time and often the doctors may need to interpret  multiple results in order to provide you with the best plan of care or course of treatment. Therefore, we ask that you please give [place] 2 business days to thoroughly review all your results before contacting the office for clarification. Should we see a critical lab result, you will be contacted sooner.     If You Need Anything After Your Visit   If you have any questions or concerns for your doctor, please call our main line at [phone]. If no one answers, please leave a voicemail as directed and we will return your call as soon as possible. Messages left after 4 pm will be answered the following business day.    You may also send [place] a message via MyChart. We typically respond to MyChart messages within 1-2 business days.  For prescription refills, please ask your pharmacy to contact our office. Our fax number is [phone].  If you have an urgent issue when the clinic is closed that cannot wait until the next business day, you can page your doctor at the number below.     Please note that while we do our best to be available for urgent issues outside of office hours, we are not available 24/7.    If you have an urgent issue and are unable to reach [place], you may choose to seek medical care at your doctor's office, retail clinic, urgent care center, or emergency room.   If you have a medical emergency, please immediately call 911 or go to the emergency department. In the event of inclement weather, please call our main line at [phone] for an update on the status of any delays or closures.  Dermatology Medication Tips: Please keep the boxes that topical medications come in in order to help keep track of the instructions about where and how to use these. Pharmacies typically print the medication instructions only on the boxes and not directly on the medication tubes.   If your medication is too expensive, please contact our office at [phone] or send [place] a message through MyChart.     We are unable to tell what your co-pay for medications will be in advance as this is different depending on your insurance coverage. However, we may be able to find a substitute medication at lower cost or fill out paperwork to get insurance to cover a needed medication.    If a prior authorization is required to get your medication covered by your insurance company, please allow [place] 1-2 business days to complete this process.   Drug prices often vary depending on where the prescription is filled and some pharmacies may offer cheaper prices.   The website www.goodrx.com contains coupons for medications through different pharmacies. The prices here do not account for what the cost may be with help from insurance (it may be cheaper with your insurance), but the website can give you the price if you did not use any insurance.  - You can print the associated coupon and take it with your prescription to the pharmacy.  - You may also stop by our office during regular business hours and pick up a GoodRx coupon card.  - If you need your prescription sent electronically to a different pharmacy, notify  our office through [facility] or by phone at [phone]

## 2023-06-20 NOTE — Progress Notes (Signed)
   New Patient Visit   Subjective  [name] is a 83 y.o. female who presents for the following: growth on her right leg Pt has spot on right leg for a few months that isn't getting better. Pt has no hx of skin cancer. No family hx of MM. She had tried using topical steroid.  The following portions of the chart were reviewed this encounter and updated as appropriate: medications, allergies, medical history  Review of Systems:  No other skin or systemic complaints except as noted in HPI or Assessment and Plan.  Objective  Well appearing patient in no apparent distress; mood and affect are within normal limits.  A focused examination was performed of the following areas: Right leg  Relevant exam findings are noted in the Assessment and Plan.  Right ankle 0.9 cm pink papule with scale   Assessment & Plan   NEOPLASM OF UNCERTAIN BEHAVIOR OF SKIN Right ankle Skin / nail biopsy Type of biopsy: tangential   Informed consent: discussed and consent obtained   Timeout: patient name, date of birth, surgical site, and procedure verified   Procedure prep:  Patient was prepped and draped in usual sterile fashion Prep type:  Isopropyl alcohol Anesthesia: the lesion was anesthetized in a standard fashion   Anesthetic:  1% lidocaine w/ epinephrine 1-100,000 buffered w/ 8.4% NaHCO3 Instrument used: DermaBlade   Hemostasis achieved with: pressure and aluminum chloride   Outcome: patient tolerated procedure well   Post-procedure details: sterile dressing applied and wound care instructions given   Dressing type: bacitracin and pressure dressing   Specimen 1 - Surgical pathology Differential Diagnosis: R/O NMSC  Check Margins: No  Return for based on biopsy results.  I, Tillie Fantasia, CMA, am acting as scribe for Gwenith Daily, MD.   Documentation: I have reviewed the above documentation for accuracy and completeness, and I agree with the above.  Gwenith Daily, MD

## 2023-06-21 LAB — SURGICAL PATHOLOGY

## 2023-06-27 ENCOUNTER — Telehealth: Payer: Self-pay

## 2023-06-27 NOTE — Telephone Encounter (Signed)
-----   Message from [facility] sent at 06/27/2023 11:34 AM EST ----- SCC- right ankle- Mohs with me  Please call patient to discuss diagnosis and schedule for Mohs surgery.

## 2023-06-27 NOTE — Telephone Encounter (Signed)
Spoke with pt gave her bx results and treatment recommendations

## 2023-06-28 ENCOUNTER — Other Ambulatory Visit: Payer: Self-pay | Admitting: Family

## 2023-07-05 ENCOUNTER — Other Ambulatory Visit: Payer: Self-pay | Admitting: Family

## 2023-07-10 ENCOUNTER — Telehealth: Payer: Self-pay | Admitting: Adult Health

## 2023-07-10 ENCOUNTER — Other Ambulatory Visit: Payer: Self-pay | Admitting: Family

## 2023-07-10 NOTE — Telephone Encounter (Signed)
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 Could you advise going back to  Dr. Tempie Donning for [facility] or other procedure to address the problem? Would like a call back. Pt scheduled appointment with Megan on 09/19/23 at 10:30

## 2023-07-10 NOTE — Telephone Encounter (Unsigned)
Copied from CRM [id number]. Topic: Clinical - Medication Refill >> Jul 10, 2023 10:43 AM Almira Coaster wrote: Most Recent Primary Care Visit:  Provider: Delano Metz H  Department: [facility]  Visit Type: PATIENT OUTREACH 30  Date: 04/25/2023  Medication: carvedilol (COREG) 25 MG tablet  Has the patient contacted their pharmacy? Yes, pharmacy advised patient to contact primary care office (Agent: If no, request that the patient contact the pharmacy for the refill. If patient does not wish to contact the pharmacy document the reason why and proceed with request.) (Agent: If yes, when and what did the pharmacy advise?)  Is this the correct pharmacy for this prescription? Yes If no, delete pharmacy and type the correct one.  This is the patient's preferred pharmacy:  [id number] [place], [place] - [address] AT [facility] [address] [place] [address] Phone: [phone] Fax: [phone]   Has the prescription been filled recently? No  Is the patient out of the medication? Yes  Has the patient been seen for an appointment in the last year OR does the patient have an upcoming appointment? Yes  Can we respond through MyChart? Yes  Agent: Please be advised that Rx refills may take up to 3 business days. We ask that you follow-up with your pharmacy.

## 2023-07-11 NOTE — Telephone Encounter (Signed)
LMVM for pt that returned call.  °

## 2023-07-12 NOTE — Telephone Encounter (Signed)
Attempted to call, but would not connect or pick up.

## 2023-07-13 NOTE — Telephone Encounter (Signed)
Called and I could not reach anyone.

## 2023-07-17 ENCOUNTER — Encounter: Payer: Self-pay | Admitting: Dermatology

## 2023-07-17 NOTE — Telephone Encounter (Signed)
 Pt called back..  use her home # for leaving messages.  She  has appt 08/2023 with MM/NP.  She had decompression and has been doing well until  she has noted breakthru pain when eating and brushing teeth.  Pain is sporadic (not as frequent or intense) as was before.  Breakthru pain started 3-4 weeks ago.  She is taking lamotrigine 100mg  po bid.  She had follow up call with Dr. Tempie Donning about 2 months ago was doing ok, although continues with buzzing in her  head but no pain.  I relayed to call and let him know as well   She is asking if she can increase her lamotrigine.  Please advise.

## 2023-07-18 ENCOUNTER — Ambulatory Visit (INDEPENDENT_AMBULATORY_CARE_PROVIDER_SITE_OTHER): Payer: Medicare Other | Admitting: Dermatology

## 2023-07-18 ENCOUNTER — Encounter: Payer: Self-pay | Admitting: Dermatology

## 2023-07-18 VITALS — BP 138/84 | HR 60 | Temp 98.3°F

## 2023-07-18 DIAGNOSIS — L579 Skin changes due to chronic exposure to nonionizing radiation, unspecified: Secondary | ICD-10-CM

## 2023-07-18 DIAGNOSIS — L814 Other melanin hyperpigmentation: Secondary | ICD-10-CM

## 2023-07-18 DIAGNOSIS — C44722 Squamous cell carcinoma of skin of right lower limb, including hip: Secondary | ICD-10-CM | POA: Diagnosis not present

## 2023-07-18 DIAGNOSIS — C4492 Squamous cell carcinoma of skin, unspecified: Secondary | ICD-10-CM

## 2023-07-18 MED ORDER — MUPIROCIN 2 % EX OINT: 1.0000 | TOPICAL_OINTMENT | Freq: Two times a day (BID) | CUTANEOUS | 1 refills | Status: AC

## 2023-07-18 MED ORDER — TRAMADOL HCL 50 MG PO TABS: 50.0000 mg | ORAL_TABLET | Freq: Four times a day (QID) | ORAL | 0 refills | Status: DC | PRN

## 2023-07-18 NOTE — Telephone Encounter (Signed)
 Please call patient and verify how often she is getting breakthrough pain.  Is it on a daily basis?  Or once a week?  If her breakthrough pain is infrequent and does not last for long then we may consider keeping the dosage the same.  However if she is having it frequently and is lasting then we can increase to 100 mg in the morning and 150 mg at bedtime.  Reviewed side effects of Lamictal with the patient.  I did review the chart it appears that in 2022 she was on 200 mg of Lamictal twice a day and could take an additional 50 mg for breakthrough symptoms.  It seems that she has tolerated higher doses in the past.

## 2023-07-18 NOTE — Telephone Encounter (Signed)
 At 2:06 pm pt left a vm returning call to [place], [place]

## 2023-07-18 NOTE — Progress Notes (Signed)
 Follow-Up Visit   Subjective  [name] is a 83 y.o. 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 Onalee Hua.  The following portions of the chart were reviewed this encounter and updated as appropriate: medications, allergies, medical history  Review of Systems:  No other skin or systemic complaints except as noted in HPI or Assessment and Plan.  Objective  Well appearing patient in no apparent distress; mood and affect are within normal limits.  A focused examination was performed of the following areas: Right ankle Relevant physical exam findings are noted in the Assessment and Plan.   Right Ankle Healing biopsy site   Assessment & Plan   SQUAMOUS CELL CARCINOMA OF SKIN Right Ankle Mohs surgery  Consent obtained: written  Anticoagulation: Is the patient taking prescription anticoagulant and/or aspirin prescribed/recommended by a physician? No   Was the anticoagulation regimen changed prior to Mohs? No    Procedure Details: Timeout: pre-procedure verification complete Procedure Prep: patient was prepped and draped in usual sterile fashion Prep type: chlorhexidine Biopsy accession number: [id number] Biopsy lab: [facility] path Frozen section biopsy performed: No   Specimen debulked: No   Pre-Op diagnosis: squamous cell carcinoma SCC subtype: well differentiated MohsAIQ Surgical site (if tumor spans multiple areas, please select predominant area): lower limb (including hip) Surgery side: right Surgical site (from skin exam): Right Ankle Pre-operative length (cm): 1 Pre-operative width (cm): 0.9 Indications for Mohs surgery: anatomic location where tissue conservation is critical Previously treated? No    Micrographic Surgery Details: Post-operative length (cm): 1.5 Post-operative width (cm): 1.3 Number of Mohs stages: 1 Is this a complex case (associate members only):  No    Stage 1    Tumor features identified on Mohs section: no tumor identified    Depth of defect after stage: subcutaneous fat  Patient tolerance of procedure: tolerated well, no immediate complications  Reconstruction: Was the defect reconstructed?: No    Opioids: Did the patient receive a prescription for opioid/narcotic related to Mohs surgery? Yes   Indications for opioid/narcotics: patient required additional pain relief despite trial of non-opioid analgesia  Antibiotics: Does patient meet AHA guidelines for endocarditis?: No   Does patient meet AHA guidelines for orthopedic prophylaxis?: No   Were antibiotics given on the day of surgery? Yes   When were antibiotics given? post-operative Did surgery breach mucosa, expose cartilage/bone, involve an area of lymphedema/inflamed/infected tissue? No   Indication for post-operative antibiotics: anatomic location Related Medications mupirocin ointment (BACTROBAN) 2 % Apply 1 Application topically 2 (two) times daily. traMADol (ULTRAM) 50 MG tablet Take 1 tablet (50 mg total) by mouth every 6 (six) hours as needed for up to 8 doses.  Return in about 1 month (around 08/15/2023) for wound check.   07/18/2023  HISTORY OF PRESENT ILLNESS  [name] is seen in consultation at the request of Dr. 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 They note that the area has been present for about 6 months increasing in size with time.  There is no history of previous treatment.  Reports no other new or changing lesions and has no other complaints today.  Medications and allergies: see patient chart.  Review of systems: Reviewed 8 systems and notable for the above skin cancer.  All other systems reviewed are unremarkable/negative, unless noted in the HPI. Past medical history, surgical history, family history, social history were also reviewed and  are noted in the chart/questionnaire.     PHYSICAL EXAMINATION  General: Well-appearing, in no acute distress, alert and oriented x 4. Vitals reviewed in chart (if available).   Skin: Exam reveals a 1.0 x 0.9 cm erythematous papule and biopsy scar on the right ankle. There are rhytids, telangiectasias, and lentigines, consistent with photodamage.  Biopsy report(s) reviewed, confirming the diagnosis.   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 Due to location, size, histology, or recurrence and the likelihood of subclinical extension as well as the need to conserve normal surrounding tissue, the patient was deemed acceptable for Mohs micrographic surgery (MMS).  The nature and purpose of the procedure, associated benefits and risks including recurrence and scarring, possible complications such as pain, infection, and bleeding, and alternative methods of treatment if appropriate were discussed with the patient during consent. The lesion location was verified by the patient, by reviewing previous notes, pathology reports, and by photographs as well as angulation measurements if available.  Informed consent was reviewed and signed by the patient, and timeout was performed at 9:30 AM. See op note below.  2. For the photodamage and solar lentigines, sun protection discussed/information given on OTC sunscreens, and we recommend continued regular follow-up with primary dermatologist every 6 months or sooner for any growing, bleeding, or changing lesions. 3. Prognosis and future surveillance discussed. 4. Letter with treatment outcome sent to referring provider. 5. Pain acetaminophen/ibuprofen/tramadol 50 mg   MOHS MICROGRAPHIC SURGERY AND RECONSTRUCTION  Initial size:   1.0 x 0.9 cm Surgical defect/wound size: 1.5 x 1.3 cm Anesthesia:    0.33% lidocaine with 1:200,000 epinephrine EBL:    <5 mL Complications:  None Repair type:   Secondary  Intention   Stages: 1  STAGE I: Anesthesia achieved with 0.5% lidocaine with 1:200,000 epinephrine. ChloraPrep applied. 1 section(s) excised using Mohs technique (this includes total peripheral and deep tissue margin excision and evaluation with frozen sections, excised and interpreted by the same physician). The tumor was first debulked and then excised with an approx. 2mm margin.  Hemostasis was achieved with electrocautery as needed.  The specimen was then oriented, subdivided/relaxed, inked, and processed using Mohs technique.    Frozen section analysis revealed a clear deep and peripheral margin.  Reconstruction  Patient was notified of results and repair options were discussed, including second intention healing. After reviewing the advantages and disadvantages of each, we agreed on second intention healing as appropriate.   The surgical site was then lightly scrubbed with sterile, saline-soaked gauze.  The area was bandaged using Vaseline ointment, non-adherent gauze, gauze pads, and tape to provide an adequate pressure dressing.   The patient tolerated the procedure well, was given detailed written and verbal wound care instructions, and was discharged in good condition.  The patient will follow-up in 4 weeks and as scheduled with primary dermatologist.  - mupirocin ointment (BACTROBAN) 2 %; Apply 1 Application topically 2 (two) times daily.  Dispense: 60 g; Refill: 1 - traMADol (ULTRAM) 50 MG tablet; Take 1 tablet (50 mg total) by mouth every 6 (six) hours as needed for up to 8 doses.  Dispense: 8 tablet; Refill: 0   Documentation: I have reviewed the above documentation for accuracy and completeness, and I agree with the above.  Gwenith Daily, MD

## 2023-07-18 NOTE — Telephone Encounter (Signed)
I called pt and LMVM for her to return call.   

## 2023-07-18 NOTE — Patient Instructions (Addendum)
 Wound Care Instructions for After Surgery  On the day following your surgery, you should begin doing daily dressing changes until your sutures are removed: Remove the bandage. Cleanse the wound gently with soap and water.  Make sure you then dry the skin surrounding the wound completely or the tape will not stick to the skin. Do not use cotton balls on the wound. After the wound is clean and dry, apply the ointment (either prescription antibiotic prescribed by your doctor or plain Vaseline if nothing was prescribed) gently with a Q-tip. If you are using a bandaid to cover: Apply a bandaid large enough to cover the entire wound. If you do not have a bandaid large enough to cover the wound OR if you are sensitive to bandaid adhesive: Cut a non-stick pad (such as Telfa) to fit the size of the wound.  Cover the wound with the non-stick pad. If the wound is draining, you may want to add a small amount of gauze on top of the non-stick pad for a little added compression to the area. Use tape to seal the area completely.  For the next 1-2 weeks: Be sure to keep the wound moist with ointment 24/7 to ensure best healing. If you are unable to cover the wound with a bandage to hold the ointment in place, you may need to reapply the ointment several times a day. Do not bend over or lift heavy items to reduce the chance of elevated blood pressure to the wound. Do not participate in particularly strenuous activities.  Below is a list of dressing supplies you might need.  Cotton-tipped applicators - Q-tips Gauze pads (2x2 and/or 4x4) - All-Purpose Sponges New and clean tube of petroleum jelly (Vaseline) OR prescription antibiotic ointment if prescribed Either a bandaid large enough to cover the entire wound OR non-stick dressing material (Telfa) and Tape (Paper or Hypafix)  FOR ADULT SURGERY PATIENTS: If you need something for pain relief, you may take 1 extra strength Tylenol (acetaminophen) and 2  ibuprofen (200 mg) together every 4 hours as needed. (Do not take these medications if you are allergic to them or if you know you cannot take them for any other reason). Typically you may only need pain medication for 1-3 days.   Comments on the Post-Operative Period Slight swelling and redness often appear around the wound. This is normal and will disappear within several days following the surgery. The healing wound will drain a brownish-red-yellow discharge during healing. This is a normal phase of wound healing. As the wound begins to heal, the drainage may increase in amount. Again, this drainage is normal. Notify us if the drainage becomes persistently bloody, excessively swollen, or intensely painful or develops a foul odor or red streaks.  The healing wound will also typically be itchy. This is normal. If you have severe or persistent pain, Notify us if the discomfort is severe or persistent. Avoid alcoholic beverages when taking pain medicine.  In Case of Wound Hemorrhage A wound hemorrhage is when the bandage suddenly becomes soaked with bright red blood and flows profusely. If this happens, sit down or lie down with your head elevated. If the wound has a dressing on it, do not remove the dressing. Apply pressure to the existing gauze. If the wound is not covered, use a gauze pad to apply pressure and continue applying the pressure for 20 minutes without peeking. DO NOT COVER THE WOUND WITH A LARGE TOWEL OR WASH CLOTH. Release your hand from  the wound site but do not remove the dressing. If the bleeding has stopped, gently clean around the wound. Leave the dressing in place for 24 hours if possible. This wait time allows the blood vessels to close off so that you do not spark a new round of bleeding by disrupting the newly clotted blood vessels with an immediate dressing change. If the bleeding does not subside, continue to hold pressure for 40 minutes. If bleeding continues, page your  physician, contact an After Hours clinic or go to the Emergency Room.   Important Information   Due to recent changes in healthcare laws, you may see results of your pathology and/or laboratory studies on MyChart before the doctors have had a chance to review them. We understand that in some cases there may be results that are confusing or concerning to you. Please understand that not all results are received at the same time and often the doctors may need to interpret multiple results in order to provide you with the best plan of care or course of treatment. Therefore, we ask that you please give [place] 2 business days to thoroughly review all your results before contacting the office for clarification. Should we see a critical lab result, you will be contacted sooner.     If You Need Anything After Your Visit   If you have any questions or concerns for your doctor, please call our main line at [phone]. If no one answers, please leave a voicemail as directed and we will return your call as soon as possible. Messages left after 4 pm will be answered the following business day.    You may also send [place] a message via MyChart. We typically respond to MyChart messages within 1-2 business days.  For prescription refills, please ask your pharmacy to contact our office. Our fax number is [phone].  If you have an urgent issue when the clinic is closed that cannot wait until the next business day, you can page your doctor at the number below.     Please note that while we do our best to be available for urgent issues outside of office hours, we are not available 24/7.    If you have an urgent issue and are unable to reach [place], you may choose to seek medical care at your doctor's office, retail clinic, urgent care center, or emergency room.   If you have a medical emergency, please immediately call 911 or go to the emergency department. In the event of inclement weather, please call our main line at  [phone] for an update on the status of any delays or closures.  Dermatology Medication Tips: Please keep the boxes that topical medications come in in order to help keep track of the instructions about where and how to use these. Pharmacies typically print the medication instructions only on the boxes and not directly on the medication tubes.   If your medication is too expensive, please contact our office at [phone] or send [place] a message through MyChart.    We are unable to tell what your co-pay for medications will be in advance as this is different depending on your insurance coverage. However, we may be able to find a substitute medication at lower cost or fill out paperwork to get insurance to cover a needed medication.    If a prior authorization is required to get your medication covered by your insurance company, please allow [place] 1-2 business days to complete this process.   Drug  prices often vary depending on where the prescription is filled and some pharmacies may offer cheaper prices.   The website www.goodrx.com contains coupons for medications through different pharmacies. The prices here do not account for what the cost may be with help from insurance (it may be cheaper with your insurance), but the website can give you the price if you did not use any insurance.  - You can print the associated coupon and take it with your prescription to the pharmacy.  - You may also stop by our office during regular business hours and pick up a GoodRx coupon card.  - If you need your prescription sent electronically to a different pharmacy, notify our office through [facility] or by phone at [phone]

## 2023-07-18 NOTE — Telephone Encounter (Signed)
 I called pt and asked her questions about how many times per day.  She said is could several times daily, when she eats, brushes her teeth. Shooting pains.  She does want to try the lamotrigine 100mg  po am and 150mg  po pm, (the tabs she has are scored so she can break them in half).  I did go over SE, also mentioned that sent mychart message as well.  She appreciated call back.  She has not heard back from Dr. Tempie Donning.

## 2023-07-18 NOTE — Addendum Note (Signed)
 Addended by: Guy Begin on: 07/18/2023 05:23 PM   Modules accepted: Orders

## 2023-07-19 MED ORDER — LAMOTRIGINE 100 MG PO TABS: ORAL_TABLET | ORAL | 1 refills | Status: DC

## 2023-07-19 NOTE — Addendum Note (Signed)
 Addended by: Enedina Finner on: 07/19/2023 05:38 PM   Modules accepted: Orders

## 2023-07-20 ENCOUNTER — Encounter: Payer: Self-pay | Admitting: Dermatology

## 2023-07-23 NOTE — Assessment & Plan Note (Addendum)
 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 EGD 9/15 and CT neck 02/05/21 showed 6 cm mass in the cervical/upper esophagus with possible local adenopathy. 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 Cliffton Asters felt she was not a surgical candidate.  -she received concurrent chemoRT with carbo/taxol 03/08/21 - 04/14/21.  -given PDL1+, she began immunotherapy with Nivolumab on 05/21/21, and completed 1 year therapy in December 2023. -last EGD in November 2023 showed benign stenosis, no evidence of recurrence or residual disease. -restaging CT CAP on 07/27/2022 showed no evidence of disease. --12/06/2022 Ct Cap showed no evidence of metastatic disease. Recommended repeat CT Cap in 1 year (11/2023). Has been clinically doing well.  03/2023 -CT of soft tissue neck was negative for evidence of metastases and showed good response to prior treatment. -Swallow test and esophagram were done 05/23/2023.  These showed mild esophageal dysmotility, but otherwise normal exams.

## 2023-07-23 NOTE — Progress Notes (Signed)
 Patient Care Team: [name], Neta Mends, MD as PCP - General Jodelle Red, MD as PCP - Cardiology (Cardiology) Theodoro Clock (Neurosurgery) Bjorn Pippin, MD as Attending Physician (Urology) Maeola Harman, MD as Consulting Physician (Neurosurgery) Anson Fret, MD as Consulting Physician (Neurology) Iva Boop, MD as Consulting Physician (Gastroenterology) Jeani Hawking, MD as Consulting Physician (Gastroenterology) Drema Halon, MD (Inactive) as Consulting Physician (Otolaryngology) Malachy Mood, MD as Consulting Physician (Hematology) Dorothy Puffer, MD as Consulting Physician (Radiation Oncology) Dorothy Puffer, MD as Consulting Physician (Radiation Oncology) Sherrill Raring, [facility] (Pharmacist)  Clinic Day:  07/27/2023  Referring physician: Madelin Headings, MD  ASSESSMENT & PLAN:   Assessment & Plan: 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 EGD 9/15 and CT neck 02/05/21 showed 6 cm mass in the cervical/upper esophagus with possible local adenopathy. 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 Cliffton Asters felt she was not a surgical candidate.  -she received concurrent chemoRT with carbo/taxol 03/08/21 - 04/14/21.  -given PDL1+, she began immunotherapy with Nivolumab on 05/21/21, and completed 1 year therapy in December 2023. -last EGD in November 2023 showed benign stenosis, no evidence of recurrence or residual disease. -restaging CT CAP on 07/27/2022 showed no evidence of disease. --12/06/2022 Ct Cap showed no evidence of metastatic disease. Recommended repeat CT Cap in 1 year (11/2023). Has been clinically doing well.  03/2023 -CT of soft tissue neck was negative for evidence of metastases and showed good response to prior treatment. -Swallow test and esophagram were done 05/23/2023.  These showed mild esophageal dysmotility,  but otherwise normal exams.   Dysphagia Likely a combination of esophageal dysmotility and history of esophageal cancer. Reviewed results of swallow test and esophagram done late last year.  Results are stable, showing mild esophageal dysmotility.  GI provider reluctant to do esophageal dilation to help with dysphagia as same area has been dilated several times and risk of adverse event increases with each procedure.  Patient says this is currently a manageable symptom.  She understands that she needs to be extremely careful with chewing food.  She needs to eat very small bites and drink plenty of fluids with each meal or snack.  Plan:  Reviewed labs with the patient and her husband.  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  This has been stable over past several checks.  Will continue to monitor. She continues to follow-up with GI provider routinely.  Understands that there is reluctance to repeat esophageal dilation as this has been done multiple times in the past.  She states that dysphagia is currently stable and no worse.  It is manageable. Follow-up with labs in 4 months.  She understands she can return sooner if needed.  The patient understands the plans discussed today and is in agreement with them.  She knows to contact our office if she develops concerns prior to her next appointment.  I provided 25 minutes of face-to-face time during this encounter and > 50% was spent counseling as documented under my assessment and plan.    Carlean Jews, NP  [facility] - A DEPT OF [facility] [address] [place] [address] Dept: [phone] Dept Fax: [phone]   No orders of the defined types were placed in this encounter.     CHIEF COMPLAINT:  CC: Esophageal  cancer  Current Treatment: Surveillance  INTERVAL HISTORY:  [name] is here today for repeat clinical assessment.  She was last seen by myself  on 03/31/2023.  She did have CT scan of her neck showing no evidence of metastatic disease in the jaw or the neck and a positive response to treatment.  Swallow test and esophagram were done 05/23/2023.  These showed mild esophageal dysmotility, but otherwise normal exams.  She does report persistent, but stable dysphagia.  She is making conscious effort to take very small bites, chew food very carefully, and drink plenty of fluids with each meal and snack.  She denies chest pain, chest pressure, or shortness of breath.  She does have some mild, bilateral lower extremity edema for which she takes fluid pills.  She denies headaches or visual disturbances. He denies abdominal pain, nausea, vomiting, or changes in bowel or bladder habits.  She denies fevers or chills. She denies pain. Her appetite is fair. Her weight has decreased 4 pounds over last 6 weeks .  I have reviewed the past medical history, past surgical history, social history and family history with the patient and they are unchanged from previous note.  ALLERGIES:  is allergic to other, bactrim [sulfamethoxazole-trimethoprim], hydrocodone, and sulfa antibiotics.  MEDICATIONS:  Current Outpatient Medications  Medication Sig Dispense Refill   acetaminophen (TYLENOL) 500 MG tablet Take 500-1,000 mg by mouth every 4 (four) hours as needed for headache, mild pain or moderate pain.     albuterol (VENTOLIN HFA) 108 (90 Base) MCG/ACT inhaler INHALE 1 TO 2 PUFFS INTO THE LUNGS EVERY 6 HOURS AS NEEDED FOR WHEEZING OR SHORTNESS OF BREATH 6.7 g 0   Calcium Carb-Cholecalciferol 600-20 MG-MCG TABS Take by mouth.     carvedilol (COREG) 25 MG tablet TAKE 1 TABLET(25 MG) BY MOUTH TWICE DAILY WITH A MEAL 60 tablet 2   conjugated estrogens (PREMARIN) vaginal cream Place 1 applicator vaginally once a week.     estradiol (ESTRACE) 0.1 MG/GM vaginal cream APPLY 1/2 VAGINALLY AT BEDTIME TWICE WEEKLY     famotidine (PEPCID) 20 MG tablet One after supper 30 tablet  11   fluticasone (FLONASE) 50 MCG/ACT nasal spray Place 2 sprays into both nostrils 2 (two) times daily. 16 g 6   fosfomycin (MONUROL) 3 g PACK TAKE 1 PACKET BY MOUTH FOR 1 DOSE.     furosemide (LASIX) 20 MG tablet TAKE 1 TABLET(20 MG) BY MOUTH DAILY 90 tablet 0   guaiFENesin-codeine 100-10 MG/5ML syrup PLACE INTO FEEDING TUBE TWICE DAILY FOR COUGH (Patient taking differently: Take 5 mLs by mouth every 6 (six) hours as needed for cough.) 236 mL 0   lamoTRIgine (LAMICTAL) 100 MG tablet Take 100mg   am and 150mg  pm. 225 tablet 1   loperamide (IMODIUM) 2 MG capsule 2 mg daily as needed for diarrhea or loose stools. Per tube     Multiple Minerals-Vitamins (CAL-MAG-ZINC-D PO) Take 1 tablet by mouth 2 (two) times daily.     Multiple Vitamin (MULTIVITAMIN WITH MINERALS) TABS tablet Place 1 tablet into feeding tube in the morning.     mupirocin ointment (BACTROBAN) 2 % Apply 1 Application topically 2 (two) times daily. 60 g 1   Nutritional Supplements (NUTREN 1.5) LIQD Place 237 mLs into feeding tube daily.     omeprazole (PRILOSEC) 40 MG capsule Take 30- 60 min before your first and last meals of the day 180 capsule 1   ondansetron (ZOFRAN-ODT) 4 MG disintegrating tablet DISSOLVE 1 TO 2 TABLETS(4 TO  8 MG) ON THE TONGUE EVERY 8 HOURS AS NEEDED FOR NAUSEA OR VOMITING 30 tablet 1   Polyethyl Glycol-Propyl Glycol (SYSTANE) 0.4-0.3 % GEL ophthalmic gel Place 1 application into both eyes 2 (two) times daily as needed (dry eyes).     prochlorperazine (COMPAZINE) 10 MG tablet TAKE 1 TABLET(10 MG) BY MOUTH EVERY 6 HOURS AS NEEDED FOR NAUSEA OR VOMITING 30 tablet 1   traMADol (ULTRAM) 50 MG tablet Take 1 tablet (50 mg total) by mouth every 6 (six) hours as needed for up to 8 doses. 8 tablet 0   triamcinolone cream (KENALOG) 0.1 % Apply 1 Application topically 2 (two) times daily.     No current facility-administered medications for this visit.    HISTORY OF PRESENT ILLNESS:   Oncology History Overview Note    Cancer Staging  Malignant neoplasm of upper third esophagus (HCC) Staging form: Esophagus - Squamous Cell Carcinoma, AJCC 8th Edition - Clinical stage from 02/12/2021: Stage Unknown (cTX, cN2, cM0) - Signed by Malachy Mood, MD on 03/08/2021 Stage prefix: Initial diagnosis     Malignant neoplasm of upper third esophagus (HCC)  12/21/2020 Imaging   Laryngoscopy Comments:    On fiberoptic laryngoscopy through the right nostril the nasopharynx was clear.  The base of tongue vallecula and epiglottis were normal.    Piriform sinuses were clear bilaterally and vocal cords were clear with  normal vocal mobility.  No structural abnormalities noted.   01/22/2021 Imaging   DG esophagus IMPRESSION: 1. Luminal narrowing in the high cervical esophagus over a 3 cm segment is highly concerning for esophageal neoplasm. Inflammatory process would be a secondary consideration. Luminal narrowing occurs approximately at the C5-C7 vertebral body level just below the glottis. Patient experienced several episodes of choking related to this luminal narrowing. Recommend expedient upper GI endoscopy for evaluation. 2. Distal thoracic esophagus and GE junction appear normal.   02/04/2021 Procedure   EGD by Dr. Leone Payor impression- One mass-like severe stenosis was found in the upper third of the esophagus. The stenosis was not traversed.   02/05/2021 Imaging   CT soft tissue neck w contrast IMPRESSION: Mass-like soft tissue thickening and mucosal hyperenhancement of the cervical esophagus and hypopharynx, spanning the C4-T1 levels, likely reflecting an esophageal malignancy. This measures up to 2.4 x 3.4 cm in transaxial dimensions, and 6.3 cm in craniocaudal dimension. Associated severe effacement of the esophageal lumen.   Centrally necrotic right paratracheal lymph node beneath the level of the mass, measuring 1.3 x 0.9 cm, and likely reflecting a site of nodal metastatic disease.   Additional lymph nodes along the  posterior and inferior aspect of the right thyroid lobe (along the right aspect of the esophageal mass), which measure subcentimeter but are asymmetrically prominent and highly suspicious for additional sites of nodal metastatic disease.   02/12/2021 Pathology Results   FINAL MICROSCOPIC DIAGNOSIS:  A. ESOPHAGUS, UPPER CERVICAL #1, BIOPSY:  - Squamous cell carcinoma.  B. ESOPHAGUS, UPPER CERVICAL #2, BIOPSY:  - Squamous cell carcinoma.    02/12/2021 Cancer Staging   Staging form: Esophagus - Squamous Cell Carcinoma, AJCC 8th Edition - Clinical stage from 02/12/2021: Stage Unknown (cTX, cN2, cM0) - Signed by Malachy Mood, MD on 03/08/2021 Stage prefix: Initial diagnosis   02/18/2021 Initial Diagnosis   Malignant neoplasm of upper third esophagus (HCC)   02/23/2021 PET scan   IMPRESSION: Markedly hypermetabolic focal masslike thickening of the cervical esophagus, compatible with primary esophageal malignancy.   Markedly hypermetabolic right upper paratracheal lymph node,  compatible with metastatic disease.   Small right upper paratracheal and right level IIA lymph nodes with mild hypermetabolic activity, concerning for additional sites of nodal metastatic disease.   Aortic Atherosclerosis (ICD10-I70.0).   03/09/2021 - 04/13/2021 Chemotherapy   Patient is on Treatment Plan : ESOPHAGUS Carboplatin/PACLitaxel weekly x 6 weeks with XRT       05/21/2021 - 01/13/2022 Chemotherapy   Patient is on Treatment Plan : GASTROESOPHAGEAL Nivolumab q14d x 8 cycles / Nivolumab q28d     05/21/2021 - 05/05/2022 Chemotherapy   Patient is on Treatment Plan : GASTROESOPHAGEAL Nivolumab (240) q14d x 8 cycles / Nivolumab (480) q28d     07/27/2022 Imaging    IMPRESSION: 1. Stable chest CT. No evidence of metastatic disease or acute findings. 2.  Aortic Atherosclerosis (ICD10-I70.0).   12/06/2022 Imaging   CT chest, abdomen, and pelvis with contrast   IMPRESSION: 1. No acute process or evidence of metastatic  disease within the chest, abdomen, or pelvis. 2. Trace pelvic fluid, either new or increased since 04/06/2022. Nonspecific. 3. Possible bladder wall thickening and pericystic edema. Correlate with symptoms of cystitis. 4. Incidental findings, including: Coronary artery atherosclerosis. Aortic Atherosclerosis (ICD10-I70.0).       REVIEW OF SYSTEMS:   Constitutional: Denies fevers, chills or abnormal weight loss Eyes: Denies blurriness of vision Ears, nose, mouth, throat, and face: Denies mucositis or sore throat Respiratory: Denies cough, dyspnea or wheezes Cardiovascular: Denies palpitation, chest discomfort or lower extremity swelling Gastrointestinal:  Denies nausea, heartburn or change in bowel habits.  Chronic dysphagia Skin: Denies abnormal skin rashes Lymphatics: Denies new lymphadenopathy or easy bruising Neurological:Denies numbness, tingling or new weaknesses Behavioral/Psych: Mood is stable, no new changes  All other systems were reviewed with the patient and are negative.   VITALS:   Today's Vitals   07/27/23 1046  BP: 138/78  Pulse: 63  Resp: (!) 22  Temp: (!) 97.3 F (36.3 C)  TempSrc: Temporal  SpO2: 98%  Weight: 130 lb 11.2 oz (59.3 kg)  Height: 5\' 6"  (1.676 m)   Body mass index is 21.1 kg/m.   Wt Readings from Last 3 Encounters:  07/27/23 130 lb 11.2 oz (59.3 kg)  06/15/23 134 lb (60.8 kg)  05/02/23 130 lb (59 kg)    Body mass index is 21.1 kg/m.  Performance status (ECOG): 1 - Symptomatic but completely ambulatory  PHYSICAL EXAM:   GENERAL:alert, no distress and comfortable SKIN: skin color, texture, turgor are normal, no rashes or significant lesions EYES: normal, Conjunctiva are pink and non-injected, sclera clear OROPHARYNX:no exudate, no erythema and lips, buccal mucosa, and tongue normal  NECK: supple, thyroid normal size, non-tender, without nodularity LYMPH:  no palpable lymphadenopathy in the cervical, axillary or inguinal LUNGS:  clear to auscultation and percussion with normal breathing effort HEART: regular rate & rhythm and no murmurs and no lower extremity edema ABDOMEN:abdomen soft, non-tender and normal bowel sounds Musculoskeletal:no cyanosis of digits and no clubbing  NEURO: alert & oriented x 3 with fluent speech, no focal motor/sensory deficits  LABORATORY DATA:  I have reviewed the data as listed    Component Value Date/Time   NA 142 07/27/2023 0956   NA 136 03/24/2021 1508   K 4.5 07/27/2023 0956   CL 105 07/27/2023 0956   CO2 33 (H) 07/27/2023 0956   GLUCOSE 125 (H) 07/27/2023 0956   GLUCOSE 97 03/17/2006 0820   BUN 28 (H) 07/27/2023 0956   BUN 26 03/24/2021 1508   CREATININE 1.52 (H) 07/27/2023 4098  CALCIUM 9.6 07/27/2023 0956   PROT 6.5 07/27/2023 0956   PROT 6.2 03/24/2021 1508   ALBUMIN 4.0 07/27/2023 0956   ALBUMIN 4.2 03/24/2021 1508   AST 21 07/27/2023 0956   ALT 15 07/27/2023 0956   ALKPHOS 66 07/27/2023 0956   BILITOT 0.7 07/27/2023 0956   GFRNONAA 34 (L) 07/27/2023 0956   GFRAA 62 07/08/2020 1653    Lab Results  Component Value Date   WBC 4.6 07/27/2023   NEUTROABS 3.1 07/27/2023   HGB 13.2 07/27/2023   HCT 41.0 07/27/2023   MCV 92.8 07/27/2023   PLT 174 07/27/2023

## 2023-07-26 ENCOUNTER — Other Ambulatory Visit: Payer: Self-pay

## 2023-07-26 DIAGNOSIS — C153 Malignant neoplasm of upper third of esophagus: Secondary | ICD-10-CM

## 2023-07-27 ENCOUNTER — Other Ambulatory Visit: Payer: Self-pay | Admitting: Family

## 2023-07-27 ENCOUNTER — Inpatient Hospital Stay (HOSPITAL_BASED_OUTPATIENT_CLINIC_OR_DEPARTMENT_OTHER): Payer: Medicare Other

## 2023-07-27 ENCOUNTER — Inpatient Hospital Stay: Payer: Medicare Other | Attending: Nurse Practitioner | Admitting: Nurse Practitioner

## 2023-07-27 VITALS — BP 138/78 | HR 63 | Temp 97.3°F | Resp 22 | Ht 66.0 in | Wt 130.7 lb

## 2023-07-27 DIAGNOSIS — C153 Malignant neoplasm of upper third of esophagus: Secondary | ICD-10-CM

## 2023-07-27 DIAGNOSIS — R1310 Dysphagia, unspecified: Secondary | ICD-10-CM | POA: Insufficient documentation

## 2023-07-27 LAB — CBC WITH DIFFERENTIAL (CANCER CENTER ONLY)
Abs Immature Granulocytes: 0.01 10*3/uL (ref 0.00–0.07)
Basophils Absolute: 0.1 10*3/uL (ref 0.0–0.1)
Basophils Relative: 1 %
Eosinophils Absolute: 0.2 10*3/uL (ref 0.0–0.5)
Eosinophils Relative: 5 %
HCT: 41 % (ref 36.0–46.0)
Hemoglobin: 13.2 g/dL (ref 12.0–15.0)
Immature Granulocytes: 0 %
Lymphocytes Relative: 18 %
Lymphs Abs: 0.8 10*3/uL (ref 0.7–4.0)
MCH: 29.9 pg (ref 26.0–34.0)
MCHC: 32.2 g/dL (ref 30.0–36.0)
MCV: 92.8 fL (ref 80.0–100.0)
Monocytes Absolute: 0.3 10*3/uL (ref 0.1–1.0)
Monocytes Relative: 8 %
Neutro Abs: 3.1 10*3/uL (ref 1.7–7.7)
Neutrophils Relative %: 68 %
Platelet Count: 174 10*3/uL (ref 150–400)
RBC: 4.42 MIL/uL (ref 3.87–5.11)
RDW: 13.2 % (ref 11.5–15.5)
WBC Count: 4.6 10*3/uL (ref 4.0–10.5)
nRBC: 0 % (ref 0.0–0.2)

## 2023-07-27 LAB — CMP (CANCER CENTER ONLY)
ALT: 15 U/L (ref 0–44)
AST: 21 U/L (ref 15–41)
Albumin: 4 g/dL (ref 3.5–5.0)
Alkaline Phosphatase: 66 U/L (ref 38–126)
Anion gap: 4 — ABNORMAL LOW (ref 5–15)
BUN: 28 mg/dL — ABNORMAL HIGH (ref 8–23)
CO2: 33 mmol/L — ABNORMAL HIGH (ref 22–32)
Calcium: 9.6 mg/dL (ref 8.9–10.3)
Chloride: 105 mmol/L (ref 98–111)
Creatinine: 1.52 mg/dL — ABNORMAL HIGH (ref 0.44–1.00)
GFR, Estimated: 34 mL/min — ABNORMAL LOW (ref >60)
Glucose, Bld: 125 mg/dL — ABNORMAL HIGH (ref 70–99)
Potassium: 4.5 mmol/L (ref 3.5–5.1)
Sodium: 142 mmol/L (ref 135–145)
Total Bilirubin: 0.7 mg/dL (ref 0.0–1.2)
Total Protein: 6.5 g/dL (ref 6.5–8.1)

## 2023-07-31 ENCOUNTER — Encounter (INDEPENDENT_AMBULATORY_CARE_PROVIDER_SITE_OTHER): Payer: Self-pay | Admitting: Otolaryngology

## 2023-07-31 ENCOUNTER — Ambulatory Visit (INDEPENDENT_AMBULATORY_CARE_PROVIDER_SITE_OTHER): Payer: Medicare Other | Admitting: Otolaryngology

## 2023-07-31 VITALS — BP 170/93 | HR 66

## 2023-07-31 DIAGNOSIS — R053 Chronic cough: Secondary | ICD-10-CM

## 2023-07-31 DIAGNOSIS — R1310 Dysphagia, unspecified: Secondary | ICD-10-CM

## 2023-07-31 DIAGNOSIS — K222 Esophageal obstruction: Secondary | ICD-10-CM | POA: Diagnosis not present

## 2023-07-31 DIAGNOSIS — K219 Gastro-esophageal reflux disease without esophagitis: Secondary | ICD-10-CM

## 2023-07-31 DIAGNOSIS — C153 Malignant neoplasm of upper third of esophagus: Secondary | ICD-10-CM

## 2023-07-31 DIAGNOSIS — Z923 Personal history of irradiation: Secondary | ICD-10-CM

## 2023-07-31 DIAGNOSIS — J342 Deviated nasal septum: Secondary | ICD-10-CM

## 2023-07-31 DIAGNOSIS — J3089 Other allergic rhinitis: Secondary | ICD-10-CM

## 2023-07-31 DIAGNOSIS — R0982 Postnasal drip: Secondary | ICD-10-CM

## 2023-07-31 NOTE — Progress Notes (Unsigned)
 ENT Progress Note:   Update 07/31/2023  Discussed the use of AI scribe software for clinical note transcription with the patient, who gave verbal consent to proceed.  History of Present Illness   The patient is an 83 year old with hx esophageal cancer who presents with swallowing difficulties post-radiation therapy and review of her recent swallow study.  She experiences swallowing difficulties following radiation therapy for esophageal cancer. She describes a 'funny' voice and attributes it to radiation that 'burned' her throat. She experiences choking and needs to eat food in very small pieces, requiring liquid to help swallow. This has been inconvenient but manageable for her.  A swallow test revealed mild esophageal dysmotility, characterized by slowed involuntary esophageal muscle contractions. The esophagram showed no narrowing or stricture, and swallow function at the throat level was normal with no aspiration/penetration or retention of food. There was slightly slower movement through the upper esophageal sphincter, but no significant issues were noted.  She has a history of post-radiation fibrosis and blunting of the piriform sinuses and post-XRT changes were noted on her scope exam when she was seen last time.     Records Reviewed:  Initial Evaluation  Reason for Consult: chronic cough  and dysphagia, hx of esophageal cancer   HPI:  Discussed the use of AI scribe software for clinical note transcription with the patient, who gave verbal consent to proceed.  History of Present Illness   The patient is an 40 yoF with a history of esophageal cancer in remission, presents with a chronic cough and swallowing difficulties. The initial symptom leading to the cancer diagnosis was difficulty swallowing, which was initially attributed to a side effect of oxcarbazepine, a seizure medication. The cancer was discovered following a swallow test.  Post-treatment, the patient has continued to  experience swallowing difficulties, requiring small bites, thorough chewing, and the presence of liquid or moisture-rich food. A feeding tube was removed in September 2023, and an esophageal dilation procedure was performed in November 2023, which improved swallowing. However, the patient still experiences occasional gagging when food gets stuck in the throat. Pills have to be chewed or crushed in order to swallow them.   The patient's cough is unproductive and occurs two to three times a day. The patient has been advised to swallow rather than cough, which has reduced the frequency of the cough. The patient also reports a history of shortness of breath, particularly when lying down, but this symptom has not occurred recently. Already seen Pulm and PCP for dyspnea before sx resolved.   The patient has a history of reflux symptoms, for which they take omeprazole 40mg  twice a day and Pepcid at night. They also report seasonal allergies, sneezing and use nasal sprays as needed.   The patient has noticed changes in their voice, including a lower pitch and inability to sing despite having perfect pitch. The patient's diet is unrestricted but must be consumed carefully due to swallowing difficulties. The patient has no history of heart or lung disease or smoking.       Records Reviewed:  Office visit with Dr Sherene Sires 04/19/23  Brief patient profile:  72 yowf  never smoker referred to pulmonary clinic 04/19/2023 by Dr Fabian Sharp for doe  onset mid summer 2024 assoc with dysphagia/ choking sensations    S/p throat ca RT /chemo  ENT  has to chew food and freq choking since rx.     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 EGD 9/15 and CT neck 02/05/21 showed 6 cm mass in the cervical/upper esophagus with possible local adenopathy. 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 Cliffton Asters felt she was not a surgical candidate.  -she received concurrent chemoRT with carbo/taxol 03/08/21 - 04/14/21.  -given PDL1+, she began immunotherapy with Nivolumab on 05/21/21, and completed 1 year therapy in December 2023. -last EGD in November 2023 showed benign stenosis, no evidence of recurrence or residual disease. -restaging CT CAP on 07/27/2022 showed no evidence of disease. --12/06/2022 Ct Cap showed no evidence of metastatic disease. Recommended repeat CT Cap in 1 year (11/2023). Has been clinically doing well.        History of Present Illness  04/19/2023  Pulmonary/ 1st office eval/[facility]      Chief Complaint  Patient presents with   pulmonary consult      SOB improved with trelegy.  Non prod cough.   Dyspnea:  able to do 20 min  on ex machine building up speed  Cough: no cough on wakening  in am / more in daytime/ non productive  no better on vs off trelegy  Sleep: 30 deg hob elevated / one pillow off trelegy for a month cannot tell the difference  SABA use: not using    No obvious day to day or daytime pattern/variability or assoc excess/ purulent sputum or mucus plugs or hemoptysis or cp or chest tightness, subjective wheeze or overt sinus or hb symptoms.     Also denies any obvious fluctuation of symptoms with weather or environmental changes or other aggravating or alleviating factors except as outlined above    No unusual exposure hx or h/o childhood pna/ asthma or knowledge of premature birth.  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 No recent weight loss or deterioration in symptoms. Last dilation was on November 14th 2023.  Discussed the risks/benefits of further dilation (potential for damage vs potential for improved swallowing). -Continue current management. -Consider  further dilation if symptoms worsen.   Respiratory Issues Resolved with Trelegy as per Dr. Fabian Sharp. -Continue Trelegy as prescribed by Dr. Fabian Sharp  Office visit with PCP 01/17/23 Shortness of Breath                  HPI: Alira Fretwell 83 y.o. come in for sx as above.  Describes some progressive shortness of breath "cannot get her breath" at night when she lays down and sometimes earlier than that.  The symptoms she states are relieved by albuterol use.  She takes albuterol many nights but not every night Mostly at night and now in day .    Coughing  from throat.  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  Recently PPI was changed by Dr. Leone Payor. Feels wheezing in upper airway.  And no specific chest pain laying down is a trigger some nights will sleep propped up.Albuterol  fairly quickly and helps a lot.  No hx of same.  Asthma allergy chronic lung disease. Had chest and abdomen CT for staging in July no mention of the lung abnormalities but mild cardiomegaly. Denies increase in edema swelling. No change in voice from post rx baseline   Past  Medical History:  Diagnosis Date   Abdominal pain 05/29/2013   s/p rx of cephalo resistant e coli   but last rx NG  now residular ?  bladder sx repeat cx sx rx to ty and uro consult    ADJ DISORDER WITH MIXED ANXIETY & DEPRESSED MOOD 03/03/2010   Qualifier: Diagnosis of  By: Fabian Sharp MD, Neta Mends    Agent resistant to multiple antibiotics 05/29/2013   e coli   bu NG on fu.     Anemia    Anxiety    ARF (acute renal failure) (HCC) 03/12/2015   Closed head injury 02/01/2011   from syncope and had scalp laceration  neg ct .     Closed head injury 5-6 yrs ago   Colitis 11/27/2017   Complication of anesthesia    migraine several hours after general anesthesia   Depression    esophageal ca 01/2021   Fatty liver    Gall stones 2016   see ct scan neg HIDA     GERD (gastroesophageal reflux disease)    Hearing aid worn    HOH (hard of hearing)    both ears   Hyperlipidemia    Hypertension    echo nl lv function  mild dilitation 2009   Kidney infection    few yrs ago in hospital   Medication side effect 09/02/2010   Poss muscle se of 10 crestor    Migraine    hypnic HA eval by Dr. 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    Pyelonephritis 03/12/2015   Sensation of pain in anesthetized distribution of trigeminal nerve    Syncope 02/01/2011   In shower on vacation  sustained head laceration  8 sutures Had ed visit neg head ct labs and x ray    Trigeminal neuralgia pain     Past Surgical History:  Procedure Laterality Date   ABDOMINAL HYSTERECTOMY  2002   tubal   BACK SURGERY     2 times, for sciatic nerve pain   BALLOON DILATION  06/21/2021   Procedure: DILATION BALLOON used for esophageal stricture;  Surgeon: Lemar Lofty., MD;  Location: [facility];  Service: Gastroenterology;;   CARDIAC CATHETERIZATION  2000   chest pains neg   CHOLECYSTECTOMY N/A 02/21/2017   Procedure: LAPAROSCOPIC CHOLECYSTECTOMY WITH INTRAOPERATIVE CHOLANGIOGRAM;  Surgeon: Darnell Level, MD;  Location: [facility];  Service: General;  Laterality: N/A;   COLONOSCOPY     multiple   CRANIOTOMY  12/09/2011   nerve decompression right trigeminal    DIRECT LARYNGOSCOPY N/A 02/12/2021   Procedure: DIRECT LARYNGOSCOPY AND BIOPSY POSSIBLE FROZEN;  Surgeon: Drema Halon, MD;  Location: [facility];  Service: ENT;  Laterality: N/A;   DOPPLER ECHOCARDIOGRAPHY  2009   nl lv function mild lv dilitation   ESOPHAGOGASTRODUODENOSCOPY (EGD) WITH PROPOFOL N/A 06/21/2021   Procedure: ESOPHAGOGASTRODUODENOSCOPY (EGD) WITH PROPOFOL;  Surgeon: Lemar Lofty., MD;  Location: [facility];  Service: Gastroenterology;  Laterality: N/A;  Request Fluoroscopy; Plan in for dilation   ESOPHAGOGASTRODUODENOSCOPY (EGD) WITH PROPOFOL  N/A 07/19/2021   Procedure: ESOPHAGOGASTRODUODENOSCOPY (EGD) WITH PROPOFOL;  Surgeon: Meridee Score Netty Starring., MD;  Location: [facility];  Service: Endoscopy;  Laterality: N/A;  Dilation fluoro   ESOPHAGOGASTRODUODENOSCOPY (EGD) WITH PROPOFOL N/A 08/05/2021   Procedure: ESOPHAGOGASTRODUODENOSCOPY (EGD) WITH PROPOFOL - fluoro;  Surgeon: Meridee Score Netty Starring., MD;  Location: [facility];  Service: Gastroenterology;  Laterality: N/A;   ESOPHAGOGASTRODUODENOSCOPY (EGD)  WITH PROPOFOL N/A 08/23/2021   Procedure: ESOPHAGOGASTRODUODENOSCOPY (EGD) WITH PROPOFOL;  Surgeon: Iva Boop, MD;  Location: [facility];  Service: Gastroenterology;  Laterality: N/A;  Using Fluoroscopy and esophageal dilation   ESOPHAGOGASTRODUODENOSCOPY (EGD) WITH PROPOFOL N/A 09/22/2021   Procedure: ESOPHAGOGASTRODUODENOSCOPY (EGD) WITH PROPOFOL;  Surgeon: Iva Boop, MD;  Location: [facility];  Service: Gastroenterology;  Laterality: N/A;  EGD with savary and flour   ESOPHAGOGASTRODUODENOSCOPY (EGD) WITH PROPOFOL N/A 12/07/2021   Procedure: ESOPHAGOGASTRODUODENOSCOPY (EGD) WITH PROPOFOL;  Surgeon: Iva Boop, MD;  Location: [facility];  Service: Gastroenterology;  Laterality: N/A;  Schedule this with Fluroscopy, it is a savary dilation   ESOPHAGOGASTRODUODENOSCOPY (EGD) WITH PROPOFOL N/A 04/05/2022   Procedure: ESOPHAGOGASTRODUODENOSCOPY (EGD) WITH PROPOFOL;  Surgeon: Iva Boop, MD;  Location: [facility];  Service: Gastroenterology;  Laterality: N/A;  EGD /savary dilation with fluoro   EYE SURGERY Bilateral    ioc for catatracts   GASTROSTOMY N/A 02/26/2021   Procedure: OPEN GASTROSTOMY TUBE PLACEMENT;  Surgeon: Darnell Level, MD;  Location: [facility];  Service: General;  Laterality: N/A;   IR GASTROSTOMY TUBE MOD SED  02/22/2021   IR GASTROSTOMY TUBE REMOVAL  02/10/2022   IR REPLACE G-TUBE SIMPLE WO FLUORO  09/07/2021   laparoscopic gallbladder surgery  02/16/2017   Fax from [facility]    OOPHORECTOMY Bilateral 2002   rt shoulder surgery     SAVORY DILATION N/A 06/21/2021   Procedure: SAVORY DILATION;  Surgeon: Lemar Lofty., MD;  Location: [facility];  Service: Gastroenterology;  Laterality: N/A;   SAVORY DILATION N/A 07/19/2021   Procedure: SAVORY DILATION;  Surgeon: Meridee Score Netty Starring., MD;  Location: [facility];  Service: Endoscopy;  Laterality: N/A;   SAVORY DILATION N/A 08/05/2021   Procedure: SAVORY DILATION;  Surgeon: Meridee Score Netty Starring., MD;  Location: [facility];  Service: Gastroenterology;  Laterality: N/A;   SAVORY DILATION N/A 08/23/2021   Procedure: SAVORY DILATION;  Surgeon: Iva Boop, MD;  Location: [facility];  Service: Gastroenterology;  Laterality: N/A;   SAVORY DILATION N/A 09/22/2021   Procedure: SAVORY DILATION;  Surgeon: Iva Boop, MD;  Location: [facility];  Service: Gastroenterology;  Laterality: N/A;   SAVORY DILATION N/A 12/07/2021   Procedure: SAVORY DILATION;  Surgeon: Iva Boop, MD;  Location: [facility];  Service: Gastroenterology;  Laterality: N/A;   SAVORY DILATION N/A 04/05/2022   Procedure: SAVORY DILATION;  Surgeon: Iva Boop, MD;  Location: [facility];  Service: Gastroenterology;  Laterality: N/A;    Family History  Problem Relation Age of Onset   Cancer Mother        uterine   Ovarian cancer Mother    Stroke Mother    Alcohol abuse Father    Stroke Father    Diabetes Brother    Cancer Paternal Aunt        leukemia, unknown type   Seizures Daughter    Hypertension Other    Colon cancer Neg Hx    Dementia Neg Hx    Alzheimer's disease Neg Hx     Social History:  reports that she has never smoked. She has never used smokeless tobacco. She reports current alcohol use. She reports that she does not use drugs.  Allergies:  Allergies  Allergen Reactions   Other Rash   Bactrim [Sulfamethoxazole-Trimethoprim] Hives and Itching   Hydrocodone Other (See Comments)    Rebound headaches    Sulfa Antibiotics Other (See Comments)    Medications: I have reviewed the  patient's current medications.  The PMH, PSH, Medications, Allergies, and SH were reviewed and updated.  ROS: Constitutional: Negative for fever, weight loss and weight gain. Cardiovascular: Negative for chest pain and dyspnea on exertion. Respiratory: Is not experiencing shortness of breath at rest. Gastrointestinal: Negative for nausea and vomiting. Neurological: Negative for headaches. Psychiatric: The patient is not nervous/anxious  Blood pressure (!) 170/93, pulse 66, SpO2 96%.  PHYSICAL EXAM:  Exam: General: Well-developed, well-nourished Communication and Voice: raspy Respiratory Respiratory effort: Equal inspiration and expiration without stridor Cardiovascular Peripheral Vascular: Warm extremities with equal color/perfusion Eyes: No nystagmus with equal extraocular motion bilaterally Neuro/Psych/Balance: Patient oriented to person, place, and time; Appropriate mood and affect; Gait is intact with no imbalance; Cranial nerves I-XII are intact Head and Face Inspection: Normocephalic and atraumatic without mass or lesion Facial Strength: Facial motility symmetric and full bilaterally ENT Pinna: External ear intact and fully developed External canal: Canal is patent with intact skin Tympanic Membrane: Clear and mobile External Nose: No scar or anatomic deformity Neck Neck and Trachea: Midline trachea without mass or lesion Thyroid: No mass or nodularity Lymphatics: No lymphadenopathy   Findings of scope exam from 05/02/23: blunting of pyriform sinuses and post-XRT fibrosis changes, no pooling of secretions. B/l VF with evidence of atrophy but normal mobility b/l, moderate post-cricoid edema  Studies Reviewed: CT neck 04/18/23 CLINICAL DATA:  82 year old female with cervical esophageal cancer. Post treatment restaging. "Superficial swelling".   EXAM: CT NECK WITH CONTRAST    TECHNIQUE: Multidetector CT imaging of the neck was performed using the standard protocol following the bolus administration of intravenous contrast.   RADIATION DOSE REDUCTION: This exam was performed according to the departmental dose-optimization program which includes automated exposure control, adjustment of the mA and/or kV according to patient size and/or use of iterative reconstruction technique.   CONTRAST:  65mL OMNIPAQUE IOHEXOL 300 MG/ML  SOLN   COMPARISON:  Neck CT 06/18/2021.  More recent chest CT 12/06/2022.   FINDINGS: Pharynx and larynx: There in G ule and pharyngeal soft tissue contours are stable and within normal limits. Negative parapharyngeal and retropharyngeal spaces.   Stable to mildly improved appearance of the cervical esophagus, with residual circumferential wall thickening on series 2, images 83 through 91. No discrete or masslike hyperenhancement. The upper thoracic esophagus appears stable and has a more normal appearance.   Salivary glands: Stable. Partial submandibular gland atrophy. Negative sublingual space.   Thyroid: Stable, negative for age.   Lymph nodes: No cervical lymphadenopathy.   Two marked areas of clinical concern on the skin surface correspond to the left caudal margin of the parotid gland (sagittal image 78), and the contralateral right submandibular space (series 2, image 59). There are mildly tortuous venous vessels located at both sites. No mass or lymphadenopathy at either site.   Vascular: Chronic cervical carotid atherosclerosis worse on the right side. Major vascular structures in the neck and at the skull base remain patent. No high-grade arterial stenosis identified.   Limited intracranial: Stable. Chronic right suboccipital craniotomy. Chronic right cerebellopontine angle hyperdensity, stable and thought related to previous trigeminal nerve decompression on previous MRI.   Visualized orbits: Chronic  postoperative changes to both globes.   Mastoids and visualized paranasal sinuses: Visualized paranasal sinuses and mastoids are stable and well aerated.   Skeleton: Chronic right suboccipital craniotomy. Subtle discontinuity of the left C2 superior articulating surface now on series 6, image 63 corresponds to an area of chronic smooth bone remottling in the setting of  highly tortuous underlying left vertebral artery, chronic thinning of the bone there. See series 7, image 60 and compare to previous series 7, image 58 1 year ago. C1-C2 alignment is maintained. Other chronic cervical spine degeneration appears stable. No suspicious or destructive osseous lesion is identified in the neck.   Upper chest: Stable compared to July chest CT. Mild medial left upper lobe lung scarring. Calcified aortic atherosclerosis. No superior mediastinal lymphadenopathy or upper lung nodule is identified.   IMPRESSION: 1. No mass or abnormality to correspond to two areas of clinical concern marked bilaterally.   2. Stable and satisfactory post treatment appearance of the cervical esophagus. No metastatic disease identified in the Neck.   3. Positive for a subtle degenerative appearing fracture of the left C2 vertebra articular surface with C1. There is chronic underlying remodeling of the bone there from tortuous left vertebral artery. This fracture is new from January 2023 but age indeterminate and has a benign appearance. This might predispose to accelerated arthritis there, but C1-C2 alignment remains normal.   4. Aortic Atherosclerosis (ICD10-I70.0). Carotid atherosclerosis. Chronic postoperative changes to the cerebral posterior fossa.  CT chest A/P for cancer re-staging 12/06/22 FINDINGS: CT CHEST FINDINGS   Cardiovascular: Aortic atherosclerosis. Tortuous thoracic aorta. Mild cardiomegaly, without pericardial effusion. Left main coronary artery calcification. No central pulmonary  embolism, on this non-dedicated study.   Mediastinum/Nodes: No supraclavicular adenopathy. No mediastinal or hilar adenopathy. No residual or recurrent esophageal mass.   Lungs/Pleura: No pleural fluid.  Clear lungs.   Musculoskeletal: No acute osseous abnormality.   CT ABDOMEN PELVIS FINDINGS   Hepatobiliary: Normal liver. Cholecystectomy, without biliary ductal dilatation.   Pancreas: Pancreatic atrophy. No duct dilatation or acute inflammation.   Spleen: Too small to characterize posterior splenic hypoattenuating lesion is similar and of doubtful clinical significance.   Adrenals/Urinary Tract: Normal adrenal glands. Right larger than left simple renal cysts do not warrant imaging follow-up. Maximally 3.6 cm. No hydronephrosis. The bladder wall is borderline thickened with possible subtle pericystic edema including anteriorly on 109/2.   Stomach/Bowel: Normal stomach, without wall thickening. Scattered colonic diverticula. Normal terminal ileum. Normal small bowel.   Vascular/Lymphatic: Aortic atherosclerosis. No abdominopelvic adenopathy.   Reproductive: Hysterectomy.  No adnexal mass.   Other: Trace pelvic fluid including on 106/2. No abdominal ascites. No evidence of omental or peritoneal disease.   Musculoskeletal: Osteopenia.  Trace L4-5 anterolisthesis.   IMPRESSION: 1. No acute process or evidence of metastatic disease within the chest, abdomen, or pelvis. 2. Trace pelvic fluid, either new or increased since 04/06/2022. Nonspecific. 3. Possible bladder wall thickening and pericystic edema. Correlate with symptoms of cystitis. 4. Incidental findings, including: Coronary artery atherosclerosis. Aortic Atherosclerosis (ICD10-I70.0).    EGD upper endoscopy 04/05/22     MBS/esophagram 05/23/23 FINDINGS: Swallowing: No vestibular penetration or aspiration seen during the MBS on the same day.   Pharynx: Unremarkable.   Esophagus: Fluoroscopic evaluation  demonstrates normal caliber and smooth contour of the esophagus. No evidence of fixed stricture, mass or mucosal abnormality.   Esophageal motility: Mildly decreased.   Hiatal Hernia: None.   Gastroesophageal reflux: None visualized.   Ingested 13 mm barium tablet: Patient refused barium tablet.   Other: None.   IMPRESSION: Mild esophageal dysmotility, otherwise normal esophagram  MBS report Rayssa Atha is presenting with a functional oral and pharyngeal swallow as per this MBS. No penetration or aspiration and only trace vallecular residuals obseved.(which cleared with subsequent swallows) As study progressed, barium stasis/slow moving barium observed in  upper esophagus below PES but no retrograde movement of barium observed. SLP suspects her dysphagia is primarily esophageal in nature. She is getting an esophagram after this MBS.   Assessment/Plan: Encounter Diagnoses  Name Primary?   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 Reports being able to eat and drink but has to chew things very thoroughly and drink water with food, crushes pills. No weight loss or aspiration PNA hx.  Exam today including flexible scope blunting of pyriform sinuses and post-XRT fibrosis changes, no pooling of secretions. B/l VF with evidence of atrophy but normal mobility b/l, moderate post-cricoid edema - MBS esophagram to evaluate  swallowing - will consider swallow therapy if indicated based on results     Chronic Cough Chronic, unproductive cough likely multifactorial, with contributions from reflux, postnasal drainage, and radiation-induced changes. Could be related to dysphagia, but unclear, and exam with no evidence of pooling of secretions in hypopharynx. Improved with reflux management initiated with Dr Sherene Sires. Discussed risks and benefits of cough-reducing therapy, including potential for improved quality of life and reduced cough frequency. Patient prefers to try the swallow study first. Will consider in the future. Will optimize reflux control  - Order barium swallow test to rule out penetration and aspiration  - Recommend cough-reducing therapy if needed in the future - she would like to hold off for now - Prescribe Flonase, use twice daily 2 puffs b/l nares for hx of environmental allergies and post-nasal drip  - Provide resources on managing reflux-related cough - Recommend seaweed paste supplement, Reflux Gourmet, to be taken after meals and before bedtime - continue current GERD regimen, Prilosec 40 mg BID and Pepcid 20 mg QHS  Postnasal Drainage and hx of Environmental Allergies Seasonal postnasal drainage, managed with nasal sprays as needed. Potential contributor to chronic cough. Discussed benefits of regular Flonase use. - Prescribe Flonase, use twice daily, 2 puffs b/l nares   Gastroesophageal Reflux Disease (GERD) GERD managed with omeprazole 40 mg twice daily and Pepcid at night. Likely contributing to chronic cough. Discussed benefits of continued medication and adding seaweed paste supplement. 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 Difficulty swallowing, requiring small  bites and liquids. Discussed risks and benefits of further esophageal dilation. Patient prefers to avoid further dilation unless necessary. - Continue current management and follow-up with GI specialist as needed - Order barium swallow test to evaluate swallowing function - RTC after testing   General Health Maintenance Emphasized importance of staying active, maintaining oral hygiene, and continuing to eat despite swallowing difficulties to prevent weight loss and additional swallowing issues. - Encourage regular dental cleanings and good oral hygiene - Encourage staying active and maintaining a balanced diet  Follow-up -  Schedule follow-up appointment after barium swallow test - Provide after-visit summary with instructions and resources discussed.      Update 07/31/2023 Assessment and Plan   Dysphagia hx of esophageal cancer s/p XRT Post-radiation fibrosis and blunting of the piriform sinuses noted on last scope exam, likely due to previous radiation treatment for esophageal cancer, have resulted in shallow tissue pockets affecting swallowing. She experiences dysphagia, requiring small food bites/chewing thoroughly and liquids to aid swallowing. Radiation has also affected the minor salivary glands, reducing throat lubrication and exacerbating swallowing difficulties. Esophageal dilation was advised against by the gastroenterologist due to potential risks outweighing benefits. The focus is on maintaining nutrition without compromising quality of life.MBS with overall intact swallowing and no aspiration/penetration. Esophagram without strictures noted.  - Advise use of strategies to aid swallowing, such as adding sauce to solid foods and drinking liquids to facilitate swallowing. - Monitor nutritional status to ensure adequate intake. - Encourage the use of moist foods and sauces to aid in swallowing. - RTC as needed    Ashok Croon, MD Otolaryngology [facility] Phone: [phone] Fax: [phone]    08/01/2023, 1:13 PM

## 2023-08-02 ENCOUNTER — Encounter: Payer: Self-pay | Admitting: Nurse Practitioner

## 2023-08-08 ENCOUNTER — Other Ambulatory Visit: Payer: Self-pay | Admitting: Family

## 2023-08-08 LAB — HM MAMMOGRAPHY

## 2023-08-11 ENCOUNTER — Ambulatory Visit: Payer: Self-pay

## 2023-08-11 ENCOUNTER — Telehealth: Payer: Self-pay | Admitting: Adult Health

## 2023-08-11 ENCOUNTER — Ambulatory Visit (INDEPENDENT_AMBULATORY_CARE_PROVIDER_SITE_OTHER): Admitting: Family

## 2023-08-11 VITALS — BP 146/84 | HR 62 | Temp 98.4°F | Ht 66.0 in | Wt 127.2 lb

## 2023-08-11 DIAGNOSIS — C4492 Squamous cell carcinoma of skin, unspecified: Secondary | ICD-10-CM | POA: Diagnosis not present

## 2023-08-11 NOTE — Patient Instructions (Addendum)
 Continue Mupirocin Ointment.

## 2023-08-11 NOTE — Progress Notes (Signed)
 Patient ID: [name], female    DOB: November 16, 1940  MRN: [id number]  CC: Skin Biopsy Check  Subjective: [name] is a 83 y.o. female who presents for skin biopsy check.   Her concerns today include:  States she had skin cancer of right ankle removed by biopsy 1.5 weeks ago. Reports she has been cleansing and applying prescribed creams as instructed. States today she noticed drainage from right ankle. Denies red flag symptoms. States she tried to schedule an appointment with Dermatology on today but was unable to because they are closed. No further issues/concerns for discussion today.   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) 02/18/2021   Dysphagia 02/03/2021   Elevated blood-pressure reading, without diagnosis of hypertension 11/05/2020   S/P craniotomy 10/28/2020   Hematoma of abdominal wall, initial encounter 05/16/2020   History of lumbosacral spine surgery 05/07/2020   Spondylosis without myelopathy or radiculopathy, lumbar region 03/27/2019   Radiculopathy due to lumbar intervertebral disc disorder 03/27/2019   Spinal stenosis of lumbar region with neurogenic claudication 03/27/2019   Diarrhea 11/27/2017   Unilateral headache 05/26/2015   Nausea without vomiting 12/10/2014   Essential hypertension 05/29/2014   Post-traumatic headache 09/19/2013   Low back pain radiating to right leg 01/16/2013   Sinus problem 11/20/2012   Chronic headaches morning 10/19/2012   Leg pain, posterior 08/16/2012   Sciatic neuritis 08/16/2012   Disturbance in sleep behavior 08/16/2012   Medication management 03/24/2012   Preventative health care 09/24/2011   Trigeminal neuralgia 09/24/2011   Postmenopausal  HRT (hormone replacement therapy) 09/24/2011   Polycythemia 09/20/2011   Morning headache 02/01/2011   Hearing aid worn    LOCALIZED SUPERFICIAL SWELLING MASS OR LUMP 09/21/2009   Hypnic headache 03/19/2008   Cholelithiasis with chronic cholecystitis 03/19/2008   Headache(784.0) 06/05/2007   JAW PAIN 04/06/2007   Osteoarthritis 04/06/2007   HYPERLIPIDEMIA 12/25/2006   HYPERTENSION 12/25/2006     Current Outpatient Medications on File Prior to Visit  Medication Sig Dispense Refill   acetaminophen (TYLENOL) 500 MG tablet Take 500-1,000 mg by mouth every 4 (four) hours as needed for headache, mild pain or moderate pain.     albuterol (VENTOLIN HFA) 108 (90 Base) MCG/ACT inhaler INHALE 1 TO 2 PUFFS INTO THE LUNGS EVERY 6 HOURS AS NEEDED FOR WHEEZING OR SHORTNESS OF BREATH 6.7 g 0   Calcium Carb-Cholecalciferol 600-20 MG-MCG TABS Take by mouth.     carvedilol (COREG) 25 MG tablet TAKE 1 TABLET(25 MG) BY MOUTH TWICE DAILY WITH A MEAL 60 tablet 2   conjugated estrogens (PREMARIN) vaginal cream Place 1 applicator vaginally once a week.     estradiol (ESTRACE) 0.1 MG/GM vaginal cream APPLY 1/2 VAGINALLY AT BEDTIME TWICE WEEKLY     famotidine (PEPCID) 20 MG tablet One after supper 30 tablet 11   fluticasone (FLONASE) 50 MCG/ACT nasal spray Place 2 sprays into both nostrils 2 (two) times daily. 16 g 6   fosfomycin (MONUROL) 3 g PACK TAKE 1 PACKET BY MOUTH FOR 1 DOSE.     furosemide (LASIX) 20 MG tablet TAKE 1 TABLET(20 MG) BY MOUTH DAILY 90 tablet 0   guaiFENesin-codeine 100-10 MG/5ML syrup PLACE INTO FEEDING TUBE TWICE DAILY FOR COUGH (Patient taking differently: Take  5 mLs by mouth every 6 (six) hours as needed for cough.) 236 mL 0   lamoTRIgine (LAMICTAL) 100 MG tablet Take 100mg   am and 150mg  pm. 225 tablet 1   loperamide (IMODIUM) 2 MG capsule 2 mg daily as needed for diarrhea or loose stools. Per tube     Multiple Minerals-Vitamins (CAL-MAG-ZINC-D PO) Take 1 tablet by mouth 2 (two) times  daily.     Multiple Vitamin (MULTIVITAMIN WITH MINERALS) TABS tablet Place 1 tablet into feeding tube in the morning.     mupirocin ointment (BACTROBAN) 2 % Apply 1 Application topically 2 (two) times daily. 60 g 1   Nutritional Supplements (NUTREN 1.5) LIQD Place 237 mLs into feeding tube daily.     omeprazole (PRILOSEC) 40 MG capsule Take 30- 60 min before your first and last meals of the day 180 capsule 1   ondansetron (ZOFRAN-ODT) 4 MG disintegrating tablet DISSOLVE 1 TO 2 TABLETS(4 TO 8 MG) ON THE TONGUE EVERY 8 HOURS AS NEEDED FOR NAUSEA OR VOMITING 30 tablet 1   Polyethyl Glycol-Propyl Glycol (SYSTANE) 0.4-0.3 % GEL ophthalmic gel Place 1 application into both eyes 2 (two) times daily as needed (dry eyes).     prochlorperazine (COMPAZINE) 10 MG tablet TAKE 1 TABLET(10 MG) BY MOUTH EVERY 6 HOURS AS NEEDED FOR NAUSEA OR VOMITING 30 tablet 1   traMADol (ULTRAM) 50 MG tablet Take 1 tablet (50 mg total) by mouth every 6 (six) hours as needed for up to 8 doses. 8 tablet 0   triamcinolone cream (KENALOG) 0.1 % Apply 1 Application topically 2 (two) times daily.     No current facility-administered medications on file prior to visit.    Allergies  Allergen Reactions   Other Rash   Bactrim [Sulfamethoxazole-Trimethoprim] Hives and Itching   Hydrocodone Other (See Comments)    Rebound headaches   Sulfa Antibiotics Other (See Comments)    Social History   Socioeconomic History   Marital status: Married    Spouse name: Not on file   Number of children: 2   Years of education: Not on file   Highest education level: Not on file  Occupational History    Comment: retired Customer service manager  Tobacco Use   Smoking status: Never   Smokeless tobacco: Never  Vaping Use   Vaping status: Never Used  Substance and Sexual Activity   Alcohol use: Yes    Comment: occ wine   Drug use: No   Sexual activity: Not on file  Other Topics Concern   Not on file  Social History Narrative   Married    HH of 2-3 (god daughter)   Pets 2 dogs   Non smoker    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, community   G2P2      Caffeine: 2 cups/day   Social Drivers of Corporate investment banker Strain: Low Risk  (04/12/2022)   Overall Financial Resource Strain (CARDIA)    Difficulty of Paying Living Expenses: Not hard at all  Food Insecurity: No Food Insecurity (08/08/2022)   Hunger Vital Sign    Worried About Running Out of Food in the Last Year: Never true    Ran Out of Food in the Last Year: Never true  Transportation Needs: No Transportation Needs (08/08/2022)   PRAPARE - Administrator, Civil Service (Medical): No    Lack of Transportation (Non-Medical): No  Physical Activity: Insufficiently Active (04/12/2022)   Exercise Vital Sign    Days of Exercise per  Week: 3 days    Minutes of Exercise per Session: 30 min  Stress: No Stress Concern Present (04/12/2022)   [employer] of Occupational Health - Occupational Stress Questionnaire    Feeling of Stress : Not at all  Social Connections: Moderately Isolated (04/12/2022)   Social Connection and Isolation Panel [NHANES]    Frequency of Communication with Friends and Family: More than three times a week    Frequency of Social Gatherings with Friends and Family: More than three times a week    Attends Religious Services: Never    Database administrator or Organizations: No    Attends Banker Meetings: Never    Marital Status: Married  Catering manager Violence: Not At Risk (04/12/2022)   Humiliation, Afraid, Rape, and Kick questionnaire    Fear of Current or Ex-Partner: No    Emotionally Abused: No    Physically Abused: No    Sexually Abused: No    Family History  Problem Relation Age of Onset   Cancer Mother        uterine   Ovarian cancer Mother    Stroke Mother    Alcohol abuse Father    Stroke Father    Diabetes Brother    Cancer Paternal Aunt        leukemia, unknown type   Seizures Daughter    Hypertension Other     Colon cancer Neg Hx    Dementia Neg Hx    Alzheimer's disease Neg Hx     Past Surgical History:  Procedure Laterality Date   ABDOMINAL HYSTERECTOMY  2002   tubal   BACK SURGERY     2 times, for sciatic nerve pain   BALLOON DILATION  06/21/2021   Procedure: DILATION BALLOON used for esophageal stricture;  Surgeon: Mansouraty, Netty Starring., MD;  Location: [facility];  Service: Gastroenterology;;   CARDIAC CATHETERIZATION  2000   chest pains neg   CHOLECYSTECTOMY N/A 02/21/2017   Procedure: LAPAROSCOPIC CHOLECYSTECTOMY WITH INTRAOPERATIVE CHOLANGIOGRAM;  Surgeon: Darnell Level, MD;  Location: [facility];  Service: General;  Laterality: N/A;   COLONOSCOPY     multiple   CRANIOTOMY  12/09/2011   nerve decompression right trigeminal    DIRECT LARYNGOSCOPY N/A 02/12/2021   Procedure: DIRECT LARYNGOSCOPY AND BIOPSY POSSIBLE FROZEN;  Surgeon: Drema Halon, MD;  Location: [facility];  Service: ENT;  Laterality: N/A;   DOPPLER ECHOCARDIOGRAPHY  2009   nl lv function mild lv dilitation   ESOPHAGOGASTRODUODENOSCOPY (EGD) WITH PROPOFOL N/A 06/21/2021   Procedure: ESOPHAGOGASTRODUODENOSCOPY (EGD) WITH PROPOFOL;  Surgeon: Lemar Lofty., MD;  Location: [facility];  Service: Gastroenterology;  Laterality: N/A;  Request Fluoroscopy; Plan in for dilation   ESOPHAGOGASTRODUODENOSCOPY (EGD) WITH PROPOFOL N/A 07/19/2021   Procedure: ESOPHAGOGASTRODUODENOSCOPY (EGD) WITH PROPOFOL;  Surgeon: Meridee Score Netty Starring., MD;  Location: [facility];  Service: Endoscopy;  Laterality: N/A;  Dilation fluoro   ESOPHAGOGASTRODUODENOSCOPY (EGD) WITH PROPOFOL N/A 08/05/2021   Procedure: ESOPHAGOGASTRODUODENOSCOPY (EGD) WITH PROPOFOL - fluoro;  Surgeon: Meridee Score Netty Starring., MD;  Location: [facility];  Service: Gastroenterology;  Laterality: N/A;   ESOPHAGOGASTRODUODENOSCOPY (EGD) WITH PROPOFOL N/A 08/23/2021   Procedure: ESOPHAGOGASTRODUODENOSCOPY (EGD) WITH PROPOFOL;  Surgeon: Iva Boop, MD;  Location: [facility];  Service: Gastroenterology;  Laterality: N/A;  Using Fluoroscopy and esophageal dilation   ESOPHAGOGASTRODUODENOSCOPY (EGD) WITH PROPOFOL N/A 09/22/2021   Procedure: ESOPHAGOGASTRODUODENOSCOPY (EGD) WITH PROPOFOL;  Surgeon: Iva Boop, MD;  Location: [facility];  Service: Gastroenterology;  Laterality: N/A;  EGD with savary and flour   ESOPHAGOGASTRODUODENOSCOPY (EGD) WITH PROPOFOL N/A 12/07/2021   Procedure: ESOPHAGOGASTRODUODENOSCOPY (EGD) WITH PROPOFOL;  Surgeon: Iva Boop, MD;  Location: [facility];  Service: Gastroenterology;  Laterality: N/A;  Schedule this with Fluroscopy, it is a savary dilation   ESOPHAGOGASTRODUODENOSCOPY (EGD) WITH PROPOFOL N/A 04/05/2022   Procedure: ESOPHAGOGASTRODUODENOSCOPY (EGD) WITH PROPOFOL;  Surgeon: Iva Boop, MD;  Location: [facility];  Service: Gastroenterology;  Laterality: N/A;  EGD /savary dilation with fluoro   EYE SURGERY Bilateral    ioc for catatracts   GASTROSTOMY N/A 02/26/2021   Procedure: OPEN GASTROSTOMY TUBE PLACEMENT;  Surgeon: Darnell Level, MD;  Location: [facility];  Service: General;  Laterality: N/A;   IR GASTROSTOMY TUBE MOD SED  02/22/2021   IR GASTROSTOMY TUBE REMOVAL  02/10/2022   IR REPLACE G-TUBE SIMPLE WO FLUORO  09/07/2021   laparoscopic gallbladder surgery  02/16/2017   Fax from [facility]   OOPHORECTOMY Bilateral 2002   rt shoulder surgery     SAVORY DILATION N/A 06/21/2021   Procedure: SAVORY DILATION;  Surgeon: Lemar Lofty., MD;  Location: [facility];  Service: Gastroenterology;  Laterality: N/A;   SAVORY DILATION N/A 07/19/2021   Procedure: SAVORY DILATION;  Surgeon: Meridee Score Netty Starring., MD;  Location: [facility];  Service: Endoscopy;  Laterality: N/A;   SAVORY DILATION N/A 08/05/2021   Procedure: SAVORY DILATION;  Surgeon: Meridee Score Netty Starring., MD;  Location: [facility];  Service: Gastroenterology;  Laterality: N/A;   SAVORY DILATION N/A  08/23/2021   Procedure: SAVORY DILATION;  Surgeon: Iva Boop, MD;  Location: [facility];  Service: Gastroenterology;  Laterality: N/A;   SAVORY DILATION N/A 09/22/2021   Procedure: SAVORY DILATION;  Surgeon: Iva Boop, MD;  Location: [facility];  Service: Gastroenterology;  Laterality: N/A;   SAVORY DILATION N/A 12/07/2021   Procedure: SAVORY DILATION;  Surgeon: Iva Boop, MD;  Location: [facility];  Service: Gastroenterology;  Laterality: N/A;   SAVORY DILATION N/A 04/05/2022   Procedure: SAVORY DILATION;  Surgeon: Iva Boop, MD;  Location: [facility];  Service: Gastroenterology;  Laterality: N/A;    ROS: Review of Systems Negative except as stated above  PHYSICAL EXAM: BP 137/84   Pulse 62   Temp 98.4 F (36.9 C) (Oral)   Ht 5\' 6"  (1.676 m)   Wt 127 lb 3.2 oz (57.7 kg)   SpO2 95%   BMI 20.53 kg/m   Physical Exam HENT:     Head: Normocephalic and atraumatic.     Nose: Nose normal.     Mouth/Throat:     Mouth: Mucous membranes are moist.     Pharynx: Oropharynx is clear.  Eyes:     Extraocular Movements: Extraocular movements intact.     Conjunctiva/sclera: Conjunctivae normal.     Pupils: Pupils are equal, round, and reactive to light.  Cardiovascular:     Rate and Rhythm: Normal rate and regular rhythm.     Pulses: Normal pulses.     Heart sounds: Normal heart sounds.  Pulmonary:     Effort: Pulmonary effort is normal.     Breath sounds: Normal breath sounds.  Musculoskeletal:        General: Normal range of motion.     Cervical back: Normal range of motion and neck supple.  Skin:    Comments: Right ankle biopsy pink base with minimal drainage.  Neurological:     General: No focal deficit present.     Mental Status: She  is alert and oriented to person, place, and time.  Psychiatric:        Mood and Affect: Mood normal.        Behavior: Behavior normal.     ASSESSMENT AND PLAN: 1. Squamous cell carcinoma of skin (Primary) -  Continue present management.  Curley Spice, CMA changed bandage. - Keep all scheduled appointments with Dermatology.  - Follow-up with primary provider in 3 days or sooner if needed.    Patient was given the opportunity to ask questions.  Patient verbalized understanding of the plan and was able to repeat key elements of the plan. Patient was given clear instructions to go to Emergency Department or return to medical center if symptoms don't improve, worsen, or new problems develop.The patient verbalized understanding.   Return in about 3 days (around 08/14/2023) for Follow-Up or next available with Berniece Andreas, MD.  Rema Fendt, NP

## 2023-08-11 NOTE — Telephone Encounter (Signed)
 Pt reports that her Trigeminal neuralgia pain is back, she would like a call to discuss an increase in her lamoTRIgine (LAMICTAL) tablet, please call pt to discuss.

## 2023-08-11 NOTE — Telephone Encounter (Signed)
  Chief Complaint: wound to leg Symptoms: red, pus Frequency: about a week Pertinent Negatives: Patient denies fever, pain Disposition: [] ED /[] Urgent Care (no appt availability in office) / [x] Appointment(In office/virtual)/ []  [place] [facility]/ [] Home Care/ [] Refused Recommended Disposition /[] [place] [facility]/ []  Follow-up with PCP Additional Notes: Patient states that she has some skin cancer removed about a week ago and this morning the spot on her leg is looking redden and has som pus on it. Patient states that her derm office is closed. States wound is about thumb nail sized and is located on her right leg just above her ankle.    Copied from CRM [id number]. Topic: Clinical - Red Word Triage >> Aug 11, 2023 10:23 AM Drema Balzarine wrote: Red Word that prompted transfer to Nurse Triage: Patient wound on lower right leg has pus and is red, dermatology office closed and was told to call primary care and cannot wait until Monday, I don't see any appointment available for today Reason for Disposition  [1] Looks infected (spreading redness, red streak) AND [2] no fever  Answer Assessment - Initial Assessment Questions 1. LOCATION: "Where is the wound located?"      Right leg, side above ankle  3. SIZE: If redness is present, ask: "What is the size of the red area?" (Inches, centimeters, or compare to size of a coin)      Thumb nail 4. SPREAD: "What's changed in the last day?"  "Do you see any red streaks coming from the wound?"     Red all around 5. ONSET: "When did it start to look infected?"      This morning 6. MECHANISM: "How did the wound start, what was the cause?"     Skin cancer removal 7. PAIN: Do you have any pain?"  If Yes, ask: "How bad is the pain?"  (e.g., Scale 1-10; mild, moderate, or severe)    - MILD (1-3): Doesn't interfere with normal activities.     - MODERATE (4-7): Interferes with normal activities or awakens from sleep.    - SEVERE (8-10): Excruciating  pain, unable to do any normal activities.       no 8. FEVER: "Do you have a fever?" If Yes, ask: "What is your temperature, how was it measured, and when did it start?"     no  Protocols used: Wound Infection Suspected-A-AH

## 2023-08-11 NOTE — Progress Notes (Signed)
 States [facility] referred her here.  States she had cancer removed and now there is puss, tried to contact PCP but she's not in office so referred to [place].

## 2023-08-14 NOTE — Telephone Encounter (Signed)
 Spoke with patient. She stated that when she couldn't reach [place] Friday she spoke with a friend who is a doctor and got advice on lamotrigine increase dosage safety. She is now taking 150 mg twice daily instead of 100 mg AM and 150 mg PM. Pt said she is now scheduled for another gamma knife procedure on 4/1. She does not plan to increase Lamotrigine further. She increased her dose  because she couldn't endure the pain. I asked her to call us first if she feels she needs higher dose and told her there is an on-call doctor available for advice after hours if urgent. She verbalized understanding. Right now the 150 mg twice daily is controlling her pain. We will see her 4/29 for follow-up. She thanked me for the call.

## 2023-08-15 ENCOUNTER — Telehealth: Payer: Self-pay

## 2023-08-15 MED ORDER — FUROSEMIDE 20 MG PO TABS: ORAL_TABLET | ORAL | 0 refills | Status: DC

## 2023-08-15 MED ORDER — LAMOTRIGINE 100 MG PO TABS: 150.0000 mg | ORAL_TABLET | Freq: Two times a day (BID) | ORAL | 1 refills | Status: DC

## 2023-08-15 NOTE — Telephone Encounter (Signed)
 Noted. Dr. Lucia Gaskins just an [facility]

## 2023-08-15 NOTE — Telephone Encounter (Signed)
 Angela Millet do you need to increase the dose so she doesn't run out?

## 2023-08-15 NOTE — Telephone Encounter (Signed)
Spoke to pt. Pt is aware.

## 2023-08-15 NOTE — Telephone Encounter (Signed)
 Done

## 2023-08-15 NOTE — Telephone Encounter (Signed)
 Rx sent

## 2023-08-15 NOTE — Addendum Note (Signed)
 Addended by: Vickii Chafe on: 08/15/2023 05:41 PM   Modules accepted: Orders

## 2023-08-15 NOTE — Telephone Encounter (Signed)
 Copied from CRM [id number]. Topic: Clinical - Prescription Issue >> Aug 15, 2023  1:50 PM Gurney Maxin H wrote: Reason for CRM: Patient calling to check status of refill request for her furosemide (LASIX) 20 MG tablet, refill request shows pending since 3/18, please send prescription or reach out to patient, thanks.  Arieonna Medine" [phone]

## 2023-08-15 NOTE — Addendum Note (Signed)
 Addended by: Enedina Finner on: 08/15/2023 02:44 PM   Modules accepted: Orders

## 2023-08-16 ENCOUNTER — Ambulatory Visit: Payer: Medicare Other | Admitting: Dermatology

## 2023-08-31 ENCOUNTER — Other Ambulatory Visit: Payer: Self-pay | Admitting: Internal Medicine

## 2023-09-19 ENCOUNTER — Ambulatory Visit (INDEPENDENT_AMBULATORY_CARE_PROVIDER_SITE_OTHER): Payer: Medicare Other | Admitting: Adult Health

## 2023-09-19 ENCOUNTER — Encounter: Payer: Self-pay | Admitting: Adult Health

## 2023-09-19 VITALS — BP 147/91 | HR 58 | Ht 66.0 in | Wt 129.2 lb

## 2023-09-19 DIAGNOSIS — Z5181 Encounter for therapeutic drug level monitoring: Secondary | ICD-10-CM

## 2023-09-19 DIAGNOSIS — G500 Trigeminal neuralgia: Secondary | ICD-10-CM

## 2023-09-19 NOTE — Progress Notes (Signed)
 PATIENT: [name] DOB: January 10, 1941  REASON FOR VISIT: follow up HISTORY FROM: patient PRIMARY NEUROLOGIST: Dr. Tresia Fruit   Chief Complaint  Patient presents with   Room 19    Pt is here Alone. Pt states that she is having a lot of pain since her last appointment. Pt states that she doesn't want to take any more of the Lamotrigine  due to her feeling like she is in a box and it doesn't help with her pain.     HISTORY OF PRESENT ILLNESS: Today 09/19/23:  [name] is a 83 y.o. female with a history of trigeminal neuralgia. Returns today for follow-up.  She is currently taking Lamictal  150 mg twice a day.  States that she cannot tolerate a higher dose because it affects her cognition her ability to concentrate and her gait and balance.  She has been on several different medications in the past.  She did go see Dr. Otha Blight at the beginning of April for another gamma knife procedure.  She is not sure how helpful that has been.  She states that she notices discomfort when she is trying to brush her teeth.  She also has a hard time opening her mouth wide when eating.  She returns today for an evaluation.  Tried amitriptyline , decadron , amitriptyline , nerve blocks, keppra ,gabapentin , muscle relaxers, nortriptyline , nycynta, trileptal ,baclofen ,oxy, tizanidine , cymbalta , topiramate , effexor ,tramadol .   07/18/22: [name] is a 83 y.o. female who has been followed in this office for trigeminal neuralgia. Returns today for follow-up.  She reports that the gamma knife procedure has greatly improved her discomfort. Only had 1 episode of pain maybe 3-4 weeks ago.  Patient reports that she has has constant pins and needles  on the right side of the face. Its there most of the time. Reports that she can live with it if she has to but does find it quite annoying. Spoke PA in  [place] and she advised the patient that this could be symptoms post gamma knife procedure.     Patient had MRI of the brain back in November with some abnormalities.  Dr. Tresia Fruit requested to repeat in 3 months.  MRI brain with and without contrast March 30, 2022: IMPRESSION: 1. Postsurgical changes from prior right trigeminal nerve decompression with Teflon pledget seen superior and lateral to the right trigeminal nerve. 2. Prominent enhancement of the cisternal segment of the right trigeminal nerve, new from prior MRI, likely reflecting neuritis. 3. Mild chronic microvascular ischemic changes of the white matter.  03/17/2022: Patient with H&N cancer, we have been treating her for [name] and exhasuted most m Lovely 83year old femle with severe pain. She has sufferered with trigeminal neuralgia ([name]) since 2008, in 2013 had microvascular decompression at [facility] and did great for 9 years with recurrent [name](may need revision?). Very difficult case, patient had hyponatremia with Tegretol , tried her on Trileptal , baclofen ,lamictal  abd have worried about side effects of having multiple AEDs or baclofen  in an 83 year old. tried nerve blocks . I'ver tried botox with other patients in the past with poor results, and risk of affecting CN 7,Had gamma knife as well. Other options include gamma knife, balloon, rhizotomy or others and referred to Dr. Otha Blight at [facility]. Discussed at length with patient. She has been a poor medication taker, adjusting her dose without letting us  know.Tried amitriptyline , decadron , amitriptyline , nerve blocks, keppra , nerve bkocks, muscle relaxers, nortriptyline , nycynta, trileptal ,baclofen ,oxy, tizanidine , cymbalta , topiramate , effexor ,tramadol . Her last procedure was Gamma knife 12/2020  then at [facility] in 12/2021. She's [facility] to multiple [facility]geons including [facility]   She thinks the cancer is under control. She couldn't eat for a while. She is having very different symptoms, the [name] seems to have reduced. Just on the lamictal  now and doing well at 300mg  a day. [name]  was always on the right but was always painful. Now having different symptoms more tingling in her cheek bone and above the right chin which is different. Not like trigeminal neuralgia.WOuld repeat MRi brain esp in the setting of head and neck cancer to rule out amy metastasis or spreading to the brain but this is likely remnants of trigaminal neuropathy/neuralgia.swelling in the right side of the face as well.   Patient complains of symptoms per HPI as well as the following symptoms: facial numbness and swelling . Pertinent negatives and positives per HPI. All others negative   BUN 26, creat 0.93   Today 08/12/2021: [name] is an 83 year old female with a history of Trigeminal Neuralgia. She returns today for follow-up. Reports that she is having breakthrough pain in the right jaw.  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  She states that she starts coughing for a long period of time the pain occurs. Unable to tolerate high dose of oxycarbamazepine.  She states that she does notices that she has been on these medications her balance is slightly off and she feels a little foggy headed.   Currently taking Lamictal  250 mg BID and Oxycarbamazine 150 mg BID   11/2/22[name] is a 83 y.o. female with a history of trigeminal neuralgia here today for a follow up. Patient had Gamma Knife Procedure in August, she had some initial relief then she started having breakthrough pain. Having breakthrough TN episodes nearly daily, they are worse in the evenings. She states that she rarely gets it during sleep, and last night she had woken from her sleep with terrible pain.   Patient endorses that the oxcarbamazepine starting causing side effects such as mobility (initially using cane, now has to use walker), vision changes (diminished vision, double vision (line and dots) and cognitive issues. She stated when she had decreased her dose to 150 mg BID, she noted an increased  improvement of all above and her ambulation has improved slightly as well. Her husband then said that due to breakthru pain he increased her lamotrigine  to 200mg  po bid, they have done this for three days.    Patient would like to see Dr. Nigel Bart, unfortunately he is out of the office for the next 12 weeks.Encouraged patient to reach out to his office to determine if there is another provider that specializes in those treatments in his office.    HISTORY  07/08/2020:   Referral to Dr. Otha Blight at [facility] and then also to increase lamotrigine  to 200mg  po bid. She has breakthrough pain. It gets worse around 8pm. Just before that 8pm deadline it gets bad and when she eats it breaks through. She has an appointment with Dr. laxton on 6/8. She has to bring all imaging. She had vascular decompression in 2013, lasted 9 years, may need revision?    Patient is here on concern of memory changes. She says in the last month she has gotten much better as far as the sciatica goes. She can walk around te house. She uses a Youth worker around the house. She did therapy last spring, and so she is trying to strengthen the muscles  in her legs. 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 She is very lucid right now but in the morning she gets unstable and is confused. She can almost fall after she takes her medicine. She feels fantastic now on the oxcarb and lamictal .    MRI brain: No evidence of recent infarction, hemorrhage, or mass.  Stable postoperative changes of prior trigeminal nerve decompression.Stable mild chronic microvascular ischemic changes' . MRI cervical spine:  No abnormal cord signal.  Multilevel degenerative changes as detailed above. No high-grade canal stenosis. Foraminal narrowing is  greatest on the left at C5-C6.   TSH 0.88, B12 933,    Patient complains of symptoms per HPI as well as the following symptoms: imbalance, falls . Pertinent negatives and positives per HPI. All others negative   05/11/2020: She is taking more Lamictal . She had the procedure with Dr. Nigel Bart, her right face was numb for some time, there ois discomfort now, once in a while she gets the lightning strike, the procedure made a huge difference. She is on 100mg  twice a day of the Lamictal .    [name] is a 83 y.o. female here as requested by [name], Joaquim Muir, MD for Trigeminal neuralgia. 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 Haig Levan in the past, remote kidney infection, hypertension, hyperlipidemia, hard of hearing, fatty liver, anxiety and depression.  I reviewed Dr. Clint Dana notes, she was seen there for evaluation of bilateral leg pain, increased leg pain left greater than right 6 months, weakness in both legs, she had epidural steroid injections x3, most recently left L4-L5 in November with no relief, numerous medications trialed most recently Cymbalta , Tylenol , Motrin .  Patient is on carbamazepine  500 mg twice daily for trigeminal neuralgia for which she underwent craniotomy and microvascular decompression at the [facility].,  She was referred here for trigeminal neuralgia, I reviewed her neurologic examination which included normal strength, normal tone, normal reflexes, normal cranial nerve exams, Lhermitte's negative, Romberg negative no pronator drift, Hoffmann's normal, no clonus, diagnosed with lumbar stenosis with synovial cyst causing severe low back pain and bilateral lower extremity pain with left greater than right leg syndromes, L4-L5 laminectomy with resection of synovial cyst to be performed, referred over here for  trigeminal neuralgia. Reviewed Dr. Scotty Cyphers notes, patient sodium was affected on Tegretol .    She had surgery at [facility] clinic in 2013 for trigeminal neuralgia, she was told hopefully the surgery would help but since then she had been increasing the medication and increased to 1200mg  a day and was decreased due to blood work and side effects, and now she has stopped the carbamazepine . She was getting side effects from the carbamazepine  but it was helping. Dr. Nigel Bart placed her on Oxcarbazepine (trileptal ) and now she is on cymbalta , she started the Trileptal  a few weeks ago. She was started on 300mg  and just a few days ago she was increased to 600mg  twice a day(1200mg  a day). She is not taking the cymbalta , was likely prescribed for low back pain (Dr. Daisey Dryer). The pain starts on the right and shoots down her jaw, chewing, talking makes it worse, severe, shooting, same pain  she has always experienced. The pain is severe today. She says she is unstable and tired, gets better after 45 minutes. When she did the clock test she could not draw the clock. No coordination. All the numbers were in sequence. She could not draw.       REVIEW OF SYSTEMS: Out of a complete 14 system review of symptoms, the patient complains only of the following symptoms, and all other reviewed systems are negative.  ALLERGIES: Allergies  Allergen Reactions   Other Rash   Bactrim [Sulfamethoxazole-Trimethoprim] Hives and Itching   Hydrocodone  Other (See Comments)    Rebound headaches   Sulfa Antibiotics Other (See Comments)    HOME MEDICATIONS: Outpatient Medications Prior to Visit  Medication Sig Dispense Refill   acetaminophen  (TYLENOL ) 500 MG tablet Take 500-1,000 mg by mouth every 4 (four) hours as needed for headache, mild pain or moderate pain.     albuterol  (VENTOLIN  HFA) 108 (90 Base) MCG/ACT inhaler INHALE 1 TO 2 PUFFS INTO THE LUNGS EVERY 6 HOURS AS NEEDED FOR WHEEZING OR SHORTNESS OF BREATH 6.7 g 0   Calcium  Carb-Cholecalciferol 600-20 MG-MCG TABS Take by mouth.     carvedilol  (COREG ) 25 MG tablet TAKE 1 TABLET(25 MG) BY MOUTH TWICE DAILY WITH A MEAL 60 tablet 2   conjugated estrogens  (PREMARIN ) vaginal cream Place 1 applicator vaginally once a week.     estradiol (ESTRACE) 0.1 MG/GM vaginal cream APPLY 1/2 VAGINALLY AT BEDTIME TWICE WEEKLY     famotidine  (PEPCID ) 20 MG tablet One after supper 30 tablet 11   fluticasone  (FLONASE ) 50 MCG/ACT nasal spray Place 2 sprays into both nostrils 2 (two) times daily. 16 g 6   fosfomycin (MONUROL) 3 g PACK TAKE 1 PACKET BY MOUTH FOR 1 DOSE.     furosemide  (LASIX ) 20 MG tablet TAKE 1 TABLET(20 MG) BY MOUTH DAILY 90 tablet 0   guaiFENesin -codeine  100-10 MG/5ML syrup PLACE INTO FEEDING TUBE TWICE DAILY FOR COUGH (Patient taking differently: Take 5 mLs by mouth every 6 (six) hours as needed for cough.) 236 mL 0   lamoTRIgine  (LAMICTAL ) 100 MG tablet Take 1.5 tablets (150 mg total) by mouth 2 (two) times daily. 90 tablet 1   loperamide  (IMODIUM ) 2 MG capsule 2 mg daily as needed for diarrhea or loose stools. Per tube     Multiple Minerals-Vitamins (CAL-MAG-ZINC -D PO) Take 1 tablet by mouth 2 (two) times daily.     Multiple Vitamin (MULTIVITAMIN WITH MINERALS) TABS tablet Place 1 tablet into feeding tube in the morning.     omeprazole  (PRILOSEC) 40 MG capsule Take 30- 60 min before your first and last meals of the day 180 capsule 1   ondansetron  (ZOFRAN -ODT) 4 MG disintegrating tablet DISSOLVE 1 TO 2 TABLETS(4 TO 8 MG) ON THE TONGUE EVERY 8 HOURS AS NEEDED FOR NAUSEA OR VOMITING 30 tablet 1   Polyethyl Glycol-Propyl Glycol (SYSTANE) 0.4-0.3 % GEL ophthalmic gel Place 1 application into both eyes 2 (two) times daily as needed (dry eyes).     prochlorperazine  (COMPAZINE ) 10 MG tablet TAKE 1 TABLET(10 MG) BY MOUTH EVERY 6 HOURS AS NEEDED FOR NAUSEA OR VOMITING 30 tablet 1   traMADol  (ULTRAM ) 50 MG tablet Take 1 tablet (50 mg total) by mouth every 6 (six) hours as needed  for up to 8 doses. 8 tablet 0   triamcinolone cream (KENALOG) 0.1 % Apply 1 Application topically 2 (two) times daily.     Nutritional Supplements (NUTREN 1.5) LIQD Place 237 mLs into  feeding tube daily. (Patient not taking: Reported on 09/19/2023)     No facility-administered medications prior to visit.    PAST MEDICAL HISTORY: Past Medical History:  Diagnosis Date   Abdominal pain 05/29/2013   s/p rx of cephalo resistant e coli   but last rx NG  now residular ?  bladder sx repeat cx sx rx to ty and uro consult    ADJ DISORDER WITH MIXED ANXIETY & DEPRESSED MOOD 03/03/2010   Qualifier: Diagnosis of  By: Ethel Henry MD, Joaquim Muir    Agent resistant to multiple antibiotics 05/29/2013   e coli   bu NG on fu.     Anemia    Anxiety    ARF (acute renal failure) (HCC) 03/12/2015   Closed head injury 02/01/2011   from syncope and had scalp laceration  neg ct .     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 Emma Hardy in the past   Polycythemia    Positive PPD    when young    Pyelonephritis 03/12/2015   Sensation of pain in anesthetized distribution of trigeminal nerve    Syncope 02/01/2011   In shower on vacation  sustained head laceration  8 sutures Had ed visit neg head ct labs and x ray    Trigeminal neuralgia pain     PAST SURGICAL HISTORY: Past Surgical History:  Procedure Laterality Date   ABDOMINAL HYSTERECTOMY  2002   tubal   BACK SURGERY     2 times, for sciatic  nerve pain   BALLOON DILATION  06/21/2021   Procedure: DILATION BALLOON used for esophageal stricture;  Surgeon: Normie Becton., MD;  Location: [facility];  Service: Gastroenterology;;   CARDIAC CATHETERIZATION  2000   chest pains neg   CHOLECYSTECTOMY N/A 02/21/2017   Procedure: LAPAROSCOPIC CHOLECYSTECTOMY WITH INTRAOPERATIVE CHOLANGIOGRAM;  Surgeon: Oralee Billow, MD;  Location: [facility];  Service: General;  Laterality: N/A;   COLONOSCOPY     multiple   CRANIOTOMY  12/09/2011   nerve decompression right trigeminal    DIRECT LARYNGOSCOPY N/A 02/12/2021   Procedure: DIRECT LARYNGOSCOPY AND BIOPSY POSSIBLE FROZEN;  Surgeon: Prescott Brodie, MD;  Location: [facility];  Service: ENT;  Laterality: N/A;   DOPPLER ECHOCARDIOGRAPHY  2009   nl lv function mild lv dilitation   ESOPHAGOGASTRODUODENOSCOPY (EGD) WITH PROPOFOL  N/A 06/21/2021   Procedure: ESOPHAGOGASTRODUODENOSCOPY (EGD) WITH PROPOFOL ;  Surgeon: Normie Becton., MD;  Location: [facility];  Service: Gastroenterology;  Laterality: N/A;  Request Fluoroscopy; Plan in for dilation   ESOPHAGOGASTRODUODENOSCOPY (EGD) WITH PROPOFOL  N/A 07/19/2021   Procedure: ESOPHAGOGASTRODUODENOSCOPY (EGD) WITH PROPOFOL ;  Surgeon: Brice Campi Albino Alu., MD;  Location: [facility];  Service: Endoscopy;  Laterality: N/A;  Dilation fluoro   ESOPHAGOGASTRODUODENOSCOPY (EGD) WITH PROPOFOL  N/A 08/05/2021  Procedure: ESOPHAGOGASTRODUODENOSCOPY (EGD) WITH PROPOFOL  - fluoro;  Surgeon: Mansouraty, Albino Alu., MD;  Location: [facility];  Service: Gastroenterology;  Laterality: N/A;   ESOPHAGOGASTRODUODENOSCOPY (EGD) WITH PROPOFOL  N/A 08/23/2021   Procedure: ESOPHAGOGASTRODUODENOSCOPY (EGD) WITH PROPOFOL ;  Surgeon: Kenney Peacemaker, MD;  Location: [facility];  Service: Gastroenterology;  Laterality: N/A;  Using Fluoroscopy and esophageal dilation   ESOPHAGOGASTRODUODENOSCOPY (EGD) WITH PROPOFOL  N/A 09/22/2021   Procedure:  ESOPHAGOGASTRODUODENOSCOPY (EGD) WITH PROPOFOL ;  Surgeon: Kenney Peacemaker, MD;  Location: [facility];  Service: Gastroenterology;  Laterality: N/A;  EGD with savary and flour   ESOPHAGOGASTRODUODENOSCOPY (EGD) WITH PROPOFOL  N/A 12/07/2021   Procedure: ESOPHAGOGASTRODUODENOSCOPY (EGD) WITH PROPOFOL ;  Surgeon: Kenney Peacemaker, MD;  Location: [facility];  Service: Gastroenterology;  Laterality: N/A;  Schedule this with Fluroscopy, it is a savary dilation   ESOPHAGOGASTRODUODENOSCOPY (EGD) WITH PROPOFOL  N/A 04/05/2022   Procedure: ESOPHAGOGASTRODUODENOSCOPY (EGD) WITH PROPOFOL ;  Surgeon: Kenney Peacemaker, MD;  Location: [facility];  Service: Gastroenterology;  Laterality: N/A;  EGD /savary dilation with fluoro   EYE SURGERY Bilateral    ioc for catatracts   GAMMETE INTRAFALLOPIAN TUBE TRANFER Right 2025   GASTROSTOMY N/A 02/26/2021   Procedure: OPEN GASTROSTOMY TUBE PLACEMENT;  Surgeon: Oralee Billow, MD;  Location: [facility];  Service: General;  Laterality: N/A;   IR GASTROSTOMY TUBE MOD SED  02/22/2021   IR GASTROSTOMY TUBE REMOVAL  02/10/2022   IR REPLACE G-TUBE SIMPLE WO FLUORO  09/07/2021   laparoscopic gallbladder surgery  02/16/2017   Fax from [facility]   OOPHORECTOMY Bilateral 2002   rt shoulder surgery     SAVORY DILATION N/A 06/21/2021   Procedure: SAVORY DILATION;  Surgeon: Normie Becton., MD;  Location: [facility];  Service: Gastroenterology;  Laterality: N/A;   SAVORY DILATION N/A 07/19/2021   Procedure: SAVORY DILATION;  Surgeon: Brice Campi Albino Alu., MD;  Location: [facility];  Service: Endoscopy;  Laterality: N/A;   SAVORY DILATION N/A 08/05/2021   Procedure: SAVORY DILATION;  Surgeon: Brice Campi Albino Alu., MD;  Location[facility];  Service: Gastroenterology;  Laterality: N/A;   SAVORY DILATION N/A 08/23/2021   Procedure: SAVORY DILATION;  Surgeon: Kenney Peacemaker, MD;  Location: [facility];  Service: Gastroenterology;  Laterality: N/A;    SAVORY DILATION N/A 09/22/2021   Procedure: SAVORY DILATION;  Surgeon: Kenney Peacemaker, MD;  Location: [facility];  Service: Gastroenterology;  Laterality: N/A;   SAVORY DILATION N/A 12/07/2021   Procedure: SAVORY DILATION;  Surgeon: Kenney Peacemaker, MD;  Location: [facility];  Service: Gastroenterology;  Laterality: N/A;   SAVORY DILATION N/A 04/05/2022   Procedure: SAVORY DILATION;  Surgeon: Kenney Peacemaker, MD;  Location: [facility];  Service: Gastroenterology;  Laterality: N/A;    FAMILY HISTORY: Family History  Problem Relation Age of Onset   Cancer Mother        uterine   Ovarian cancer Mother    Stroke Mother    Alcohol abuse Father    Stroke Father    Diabetes Brother    Cancer Paternal Aunt        leukemia, unknown type   Seizures Daughter    Hypertension Other    Colon cancer Neg Hx    Dementia Neg Hx    Alzheimer's disease Neg Hx     SOCIAL HISTORY: Social History   Socioeconomic History   Marital status: Married    Spouse name: Not on file   Number of children: 2   Years of education: Not on file  Highest education level: Not on file  Occupational History    Comment: retired Customer service manager  Tobacco Use   Smoking status: Never   Smokeless tobacco: Never  Vaping Use   Vaping status: Never Used  Substance and Sexual Activity   Alcohol use: Yes    Comment: occ wine   Drug use: No   Sexual activity: Not on file  Other Topics Concern   Not on file  Social History Narrative   Married   HH of 2-3 (god daughter)   Pets 2 dogs   Non smoker    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, community   G2P2      Caffeine : 2 cups/day   Social Drivers of Corporate investment banker Strain: Low Risk  (04/12/2022)   Overall Financial Resource Strain (CARDIA)    Difficulty of Paying Living Expenses: Not hard at all  Food Insecurity: No Food Insecurity (08/08/2022)   Hunger Vital Sign    Worried About Running Out of Food in the Last Year: Never true    Ran Out of Food in the Last  Year: Never true  Transportation Needs: No Transportation Needs (08/08/2022)   PRAPARE - Administrator, Civil Service (Medical): No    Lack of Transportation (Non-Medical): No  Physical Activity: Insufficiently Active (04/12/2022)   Exercise Vital Sign    Days of Exercise per Week: 3 days    Minutes of Exercise per Session: 30 min  Stress: No Stress Concern Present (04/12/2022)   [employer] of Occupational Health - Occupational Stress Questionnaire    Feeling of Stress : Not at all  Social Connections: Moderately Isolated (04/12/2022)   Social Connection and Isolation Panel [NHANES]    Frequency of Communication with Friends and Family: More than three times a week    Frequency of Social Gatherings with Friends and Family: More than three times a week    Attends Religious Services: Never    Database administrator or Organizations: No    Attends Banker Meetings: Never    Marital Status: Married  Catering manager Violence: Not At Risk (04/12/2022)   Humiliation, Afraid, Rape, and Kick questionnaire    Fear of Current or Ex-Partner: No    Emotionally Abused: No    Physically Abused: No    Sexually Abused: No      PHYSICAL EXAM  Vitals:   09/19/23 1036  BP: (!) 147/91  Pulse: (!) 58  Weight: 129 lb 3.2 oz (58.6 kg)  Height: 5\' 6"  (1.676 m)   Body mass index is 20.85 kg/m.  Generalized: Well developed, in no acute distress   Neurological examination  Mentation: Alert oriented to time, place, history taking. Follows all commands speech and language fluent Cranial nerve II-XII: Pupils were equal round reactive to light. Extraocular movements were full, visual field were full on confrontational test. Facial sensation and strength were normal.  Head turning and shoulder shrug  were normal and symmetric. Motor: The motor testing reveals 5 over 5 strength of all 4 extremities. Good symmetric motor tone is noted throughout.  Sensory: Sensory testing  is intact to soft touch on all 4 extremities. No evidence of extinction is noted.  Coordination: Cerebellar testing reveals good finger-nose-finger and heel-to-shin bilaterally.  Gait and station: Uses a cane when ambulating.  DIAGNOSTIC DATA (LABS, IMAGING, TESTING) - I reviewed patient records, labs, notes, testing and imaging myself where available.  Lab Results  Component Value Date   WBC 4.6 07/27/2023  HGB 13.2 07/27/2023   HCT 41.0 07/27/2023   MCV 92.8 07/27/2023   PLT 174 07/27/2023      Component Value Date/Time   NA 142 07/27/2023 0956   NA 136 03/24/2021 1508   K 4.5 07/27/2023 0956   CL 105 07/27/2023 0956   CO2 33 (H) 07/27/2023 0956   GLUCOSE 125 (H) 07/27/2023 0956   GLUCOSE 97 03/17/2006 0820   BUN 28 (H) 07/27/2023 0956   BUN 26 03/24/2021 1508   CREATININE 1.52 (H) 07/27/2023 0956   CALCIUM 9.6 07/27/2023 0956   PROT 6.5 07/27/2023 0956   PROT 6.2 03/24/2021 1508   ALBUMIN 4.0 07/27/2023 0956   ALBUMIN 4.2 03/24/2021 1508   AST 21 07/27/2023 0956   ALT 15 07/27/2023 0956   ALKPHOS 66 07/27/2023 0956   BILITOT 0.7 07/27/2023 0956   GFRNONAA 34 (L) 07/27/2023 0956   GFRAA 62 07/08/2020 1653   Lab Results  Component Value Date   CHOL 279 (H) 05/27/2019   HDL 118.80 05/27/2019   LDLCALC 145 (H) 05/27/2019   LDLDIRECT 135.9 10/16/2012   TRIG 78.0 05/27/2019   CHOLHDL 2 05/27/2019   Lab Results  Component Value Date   HGBA1C 5.2 11/09/2021   Lab Results  Component Value Date   VITAMINB12 933 (H) 07/01/2020   Lab Results  Component Value Date   TSH 2.302 04/07/2022      ASSESSMENT AND PLAN 83 y.o. 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 here with:  1.  Trigeminal neuralgia  Advised patient that she has tried multiple medications in the past without benefit.  She will continue on Lamictal  150 mg twice a day.  I will check a Lamictal  level today.  Advised that I would discuss with Dr. Narciso Backers to see if she has any further recommendations.  Addendum: After the visit I did have a chance to discuss with Dr. Tresia Fruit.  She recommended that we send a referral to pain management for the patient.  I will have office staff call and make her aware of this suggestion   Clem Currier, MSN, NP-C 09/19/2023, 10:58 AM [facility] [address], [address] [place], [place] [address] [phone] \

## 2023-09-19 NOTE — Patient Instructions (Signed)
Your Plan:  Continue Lamictal 150 mg twice a day If your symptoms worsen or you develop new symptoms please let us know.    Thank you for coming to see [place] at [facility]. I hope we have been able to provide you high quality care today.  You may receive a patient satisfaction survey over the next few weeks. We would appreciate your feedback and comments so that we may continue to improve ourselves and the health of our patients.

## 2023-09-20 ENCOUNTER — Telehealth: Payer: Self-pay | Admitting: *Deleted

## 2023-09-20 ENCOUNTER — Encounter: Payer: Self-pay | Admitting: Adult Health

## 2023-09-20 ENCOUNTER — Telehealth: Payer: Self-pay

## 2023-09-20 LAB — LAMOTRIGINE LEVEL: Lamotrigine Lvl: 11.5 ug/mL (ref 2.0–20.0)

## 2023-09-20 NOTE — Progress Notes (Signed)
 Per phone note patient was amenable to a referral to pain clinic.  Order has been placed

## 2023-09-20 NOTE — Telephone Encounter (Addendum)
 Called pt and let her know the below Message per NP Samson Croak.  Pt is wanting a referral sent to pain management.   ----- Message from Clem Currier sent at 09/19/2023  4:11 PM EDT ----- Please call patient and let her know that I had a chance to discuss with Dr. Narciso Backers.  Because she is already tried multiple medications in the past Dr. Narciso Backers recommended a referral to pain management to see if they can help us .

## 2023-09-20 NOTE — Progress Notes (Signed)
 Please Read Phone Note

## 2023-09-20 NOTE — Telephone Encounter (Signed)
 Called pt and LMVM for her that MM/NP did speak to Dr. Tresia Fruit about you, we have tried multiple medications and Dr. Tresia Fruit recommended seeing pain management.  Bee on the look out for this phone call.  She is to call back if questions.

## 2023-09-20 NOTE — Addendum Note (Signed)
 Addended by: Curry Dove on: 09/20/2023 11:15 AM   Modules accepted: Orders

## 2023-09-20 NOTE — Telephone Encounter (Signed)
-----   Message from [facility] sent at 09/19/2023  4:11 PM EDT ----- Please call patient and let her know that I had a chance to discuss with Dr. Narciso Backers.  Because she is already tried multiple medications in the past Dr. Narciso Backers recommended a referral to pain management to see if they can help us .

## 2023-09-21 ENCOUNTER — Telehealth: Payer: Self-pay | Admitting: Adult Health

## 2023-09-21 ENCOUNTER — Other Ambulatory Visit: Payer: Self-pay | Admitting: Adult Health

## 2023-09-21 NOTE — Telephone Encounter (Signed)
Referral for pain clinic fax to [facility]. Phone: [phone],Fax: [phone]

## 2023-10-02 ENCOUNTER — Telehealth: Payer: Self-pay | Admitting: Adult Health

## 2023-10-02 NOTE — Telephone Encounter (Signed)
 Pt called to speak to [facility] . Pt states that She was referred to a pain management specialist but have not heard anything back. Pt would like a call from Megan for [facility].

## 2023-10-03 ENCOUNTER — Other Ambulatory Visit: Payer: Self-pay

## 2023-10-03 ENCOUNTER — Telehealth: Payer: Self-pay

## 2023-10-03 NOTE — Telephone Encounter (Signed)
 Patient called in this morning stating she is having trouble swallowing. She has had her esophagus stretched twice already and went to talk with Dr. Laurine Pore to see if they could have it done again, he stated he wanted her to be seen by Dr. Maryalice Smaller to make sure she is not having a reoccurrence.He is not comfortable doing with again without clearance from oncology. She had it done last fall.

## 2023-10-04 ENCOUNTER — Telehealth: Payer: Self-pay | Admitting: Hematology

## 2023-10-09 ENCOUNTER — Other Ambulatory Visit: Payer: Self-pay | Admitting: Nurse Practitioner

## 2023-10-09 DIAGNOSIS — C153 Malignant neoplasm of upper third of esophagus: Secondary | ICD-10-CM

## 2023-10-09 NOTE — Progress Notes (Addendum)
 Patient Care Team: [name], Joaquim Muir, MD as PCP - General Sheryle Donning, MD as PCP - Cardiology (Cardiology) Zelpha Hides (Neurosurgery) Homero Luster, MD as Attending Physician (Urology) Manya Sells, MD as Consulting Physician (Neurosurgery) Glory Larsen, MD as Consulting Physician (Neurology) Kenney Peacemaker, MD as Consulting Physician (Gastroenterology) Alvis Jourdain, MD as Consulting Physician (Gastroenterology) Prescott Brodie, MD (Inactive) as Consulting Physician (Otolaryngology) Sonja Sisseton, MD as Consulting Physician (Hematology) Johna Myers, MD as Consulting Physician (Radiation Oncology) Johna Myers, MD as Consulting Physician (Radiation Oncology) Carnell Christian, [facility] (Pharmacist)  Clinic Day:  10/11/2023  Referring physician: Reginal Capra, MD  ASSESSMENT & PLAN:   Assessment & Plan: 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 EGD 9/15 and CT neck 02/05/21 showed 6 cm mass in the cervical/upper esophagus with possible local adenopathy. 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 Deloise Ferries felt she was not a surgical candidate.  -she received concurrent chemoRT with carbo/taxol  03/08/21 - 04/14/21.  -given PDL1+, she began immunotherapy with Nivolumab  on 05/21/21, and completed 1 year therapy in December 2023. -last EGD in November 2023 showed benign stenosis, no evidence of recurrence or residual disease. -restaging CT CAP on 07/27/2022 showed no evidence of disease. --12/06/2022 Ct Cap showed no evidence of metastatic disease. Recommended repeat CT Cap in 1 year (11/2023). Has been clinically doing well.  03/2023 -CT of soft tissue neck was negative for evidence of metastases and showed good response to prior treatment. -Swallow test and esophagram were done 05/23/2023.  These showed mild esophageal dysmotility,  but otherwise normal exams. -10/10/2023 - Due to worsening dysphagia, will get PET CT and ctDNA Signatera test to evaluate for metastatic disease or recurrence. Will contact GI, Dr. Willy Harvest, for guidance regarding EGD and esophageal dilation. For now, recommend full liquid diet, avoiding solid foods as much as possible. She should carefully chew any solid foods. Will followu p with patient In 3-4 weeks to discuss all results and treatment recommendations.    Dysphagia  The patient reports increased problems with swallowing. States that it takes a great deal of liquid to get anything through the esophagus. If she accidentally tries to swallowing anything too large, she starts to regurgitate, often throwing up copious amounts of fluid and salivary excretions. Family members generally think she is choking. If she is out to dinner, people will attempt Heimlich maneuver on her. She states that these episodes do not lead to shortness of breath, nor do they cause pain. She states that these episodes are happening more frequently. Difficult to want to try and eat. Has been slowly losing weight as a result. States that she doesn't have this trouble with liquids or thickened liquids. Would like to have another endoscopy with esophageal dilation. She believes that her GI provider, Dr. Willy Harvest, will not want to perform the procedure. He has voiced reluctance to do this in the past. She has had this done several times and is concerned about thin tissue and permanently damaging the esophagus. She is also concerned that esophageal cancer has recurred or metastasized, and that is what is causing the worsening dysphagia. Will get PET CT scan to evaluate for metastatic disease or recurrence of primary cancer. Will reach out to Dr. Willy Harvest for guidance. Will also get ctDNA signatera to evaluate for evidence of microscopic disease. She did have CT of her neck in 03/2023 due to  swollen lymph nodes in cervical;/mandibular area. There  was stable to mildly improved appearance of the cervical esophagus with residual circumferential wall thickening. There was no discrete or mass-like enhancement. The upper thoracic esophagus appeared stable and more normal than prior studies.  Recommend that she follow a full liquid diet for now, avoiding solid foods and possible choking concerns. She voiced understanding and agreement with the plan.   Plan:  Patient seen along with Dr. Maryalice Smaller. Reviewed labs.  -CBC normal -stable, mildly elevated renal functions. CMP unremarkable.  Will get PET CT to evaluate for evidence of metastases or recurrence.  Will get ctDNA Signatera to evaluate for circulating tumor dna.  Reach out to Dr. Willy Harvest for guidance on endoscopy and esophageal dilation.  Plan to follow up with patient in 3-4 weeks to review results and discuss plan.      The patient understands the plans discussed today and is in agreement with them.  She knows to contact our office if she develops concerns prior to her next appointment.  I provided 30 minutes of face-to-face time during this encounter and > 50% was spent counseling as documented under my assessment and plan.    Sharyon Deis, NP  [facility] - A DEPT OF [facility] [address] [place] [address] Dept: [phone] Dept Fax: [phone]   Orders Placed This Encounter  Procedures   NM PET Image Initial (PI) Skull Base To Thigh (F-18 FDG)    Standing Status:   Future    Expected Date:   10/17/2023    Expiration Date:   10/09/2024    If indicated for the ordered procedure, I authorize the administration of a radiopharmaceutical per Radiology protocol:   Yes    Preferred imaging location?:   [facility]      CHIEF COMPLAINT:  CC: Malignant neoplasm of upper third of the esophagus  Current Treatment: Surveillance  INTERVAL HISTORY:  Myriam is here today for repeat clinical assessment.   She was last seen by me on 07/27/2023.  She reports increased problems with swallowing. States that it takes a great deal of liquid to get anything through the esophagus. If she accidentally tries to swallowing anything too large, she starts to regurgitate, often throwing up copious amounts of fluid and salivary excretions. Family members generally think she is choking. If she is out to dinner, people will attempt Heimlich maneuver on her. She states that these episodes do not lead to shortness of breath, nor do they cause pain. She states that these episodes are happening more frequently. Difficult to want to try and eat. Has been slowly losing weight as a result. States that she doesn't have this trouble with liquids or thickened liquids. She denies chest pain, chest pressure, or shortness of breath. She denies headaches or visual disturbances. She denies abdominal pain, nausea, vomiting, or changes in bowel or bladder habits.  She denies fevers or chills. She denies pain. Her appetite is fair, however, she lacks the desire to eat due to dysphagia. Her weight has been stable.  I have reviewed the past medical history, past surgical history, social history and family history with the patient and they are unchanged from previous note.  ALLERGIES:  is allergic to other, bactrim [sulfamethoxazole-trimethoprim], hydrocodone , and sulfa antibiotics.  MEDICATIONS:  Current Outpatient Medications  Medication Sig Dispense Refill   acetaminophen  (TYLENOL ) 500 MG tablet Take 500-1,000 mg by mouth every 4 (four)  hours as needed for headache, mild pain or moderate pain.     albuterol  (VENTOLIN  HFA) 108 (90 Base) MCG/ACT inhaler INHALE 1 TO 2 PUFFS INTO THE LUNGS EVERY 6 HOURS AS NEEDED FOR WHEEZING OR SHORTNESS OF BREATH 6.7 g 0   Calcium Carb-Cholecalciferol 600-20 MG-MCG TABS Take by mouth.     carvedilol  (COREG ) 25 MG tablet TAKE 1 TABLET(25 MG) BY MOUTH TWICE DAILY WITH A MEAL 60 tablet 2   conjugated estrogens   (PREMARIN ) vaginal cream Place 1 applicator vaginally once a week.     estradiol (ESTRACE) 0.1 MG/GM vaginal cream APPLY 1/2 VAGINALLY AT BEDTIME TWICE WEEKLY     famotidine  (PEPCID ) 20 MG tablet One after supper 30 tablet 11   fluticasone  (FLONASE ) 50 MCG/ACT nasal spray Place 2 sprays into both nostrils 2 (two) times daily. 16 g 6   fosfomycin (MONUROL) 3 g PACK TAKE 1 PACKET BY MOUTH FOR 1 DOSE.     furosemide  (LASIX ) 20 MG tablet TAKE 1 TABLET(20 MG) BY MOUTH DAILY 90 tablet 0   guaiFENesin -codeine  100-10 MG/5ML syrup PLACE INTO FEEDING TUBE TWICE DAILY FOR COUGH (Patient taking differently: Take 5 mLs by mouth every 6 (six) hours as needed for cough.) 236 mL 0   lamoTRIgine  (LAMICTAL ) 100 MG tablet TAKE 1 TABLET(100 MG) BY MOUTH TWICE DAILY 60 tablet 1   loperamide  (IMODIUM ) 2 MG capsule 2 mg daily as needed for diarrhea or loose stools. Per tube     Multiple Minerals-Vitamins (CAL-MAG-ZINC -D PO) Take 1 tablet by mouth 2 (two) times daily.     Multiple Vitamin (MULTIVITAMIN WITH MINERALS) TABS tablet Place 1 tablet into feeding tube in the morning.     Nutritional Supplements (NUTREN 1.5) LIQD Place 237 mLs into feeding tube daily.     omeprazole  (PRILOSEC) 40 MG capsule Take 30- 60 min before your first and last meals of the day 180 capsule 1   ondansetron  (ZOFRAN -ODT) 4 MG disintegrating tablet DISSOLVE 1 TO 2 TABLETS(4 TO 8 MG) ON THE TONGUE EVERY 8 HOURS AS NEEDED FOR NAUSEA OR VOMITING 30 tablet 1   Polyethyl Glycol-Propyl Glycol (SYSTANE) 0.4-0.3 % GEL ophthalmic gel Place 1 application into both eyes 2 (two) times daily as needed (dry eyes).     prochlorperazine  (COMPAZINE ) 10 MG tablet TAKE 1 TABLET(10 MG) BY MOUTH EVERY 6 HOURS AS NEEDED FOR NAUSEA OR VOMITING 30 tablet 1   traMADol  (ULTRAM ) 50 MG tablet Take 1 tablet (50 mg total) by mouth every 6 (six) hours as needed for up to 8 doses. 8 tablet 0   triamcinolone cream (KENALOG) 0.1 % Apply 1 Application topically 2 (two) times  daily.     No current facility-administered medications for this visit.    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  The base of tongue vallecula and epiglottis were normal.    Piriform sinuses were clear bilaterally and vocal cords were clear with  normal vocal mobility.  No structural abnormalities noted.   01/22/2021 Imaging   DG esophagus IMPRESSION: 1. Luminal  narrowing in the high cervical esophagus over a 3 cm segment is highly concerning for esophageal neoplasm. Inflammatory process would be a secondary consideration. Luminal narrowing occurs approximately at the C5-C7 vertebral body level just below the glottis. Patient experienced several episodes of choking related to this luminal narrowing. Recommend expedient upper GI endoscopy for evaluation. 2. Distal thoracic esophagus and GE junction appear normal.   02/04/2021 Procedure   EGD by Dr. Willy Harvest impression- One mass-like severe stenosis was found in the upper third of the esophagus. The stenosis was not traversed.   02/05/2021 Imaging   CT soft tissue neck w contrast IMPRESSION: Mass-like soft tissue thickening and mucosal hyperenhancement of the cervical esophagus and hypopharynx, spanning the C4-T1 levels, likely reflecting an esophageal malignancy. This measures up to 2.4 x 3.4 cm in transaxial dimensions, and 6.3 cm in craniocaudal dimension. Associated severe effacement of the esophageal lumen.   Centrally necrotic right paratracheal lymph node beneath the  level of the mass, measuring 1.3 x 0.9 cm, and likely reflecting a site of nodal metastatic disease.   Additional lymph nodes along the posterior and inferior aspect of the right thyroid  lobe (along the right aspect of the esophageal mass), which measure subcentimeter but are asymmetrically prominent and highly suspicious for additional sites of nodal metastatic disease.   02/12/2021 Pathology Results   FINAL MICROSCOPIC DIAGNOSIS:  A. ESOPHAGUS, UPPER CERVICAL #1, BIOPSY:  - Squamous cell carcinoma.  B. ESOPHAGUS, UPPER CERVICAL #2, BIOPSY:  - Squamous cell carcinoma.    02/12/2021 Cancer Staging   Staging form: Esophagus - Squamous Cell Carcinoma, AJCC 8th Edition - Clinical stage from 02/12/2021: Stage Unknown (cTX, cN2, cM0) - Signed by Sonja Story, MD on 03/08/2021 Stage prefix: Initial diagnosis   02/18/2021 Initial Diagnosis   Malignant neoplasm of upper third esophagus (HCC)   02/23/2021 PET scan   IMPRESSION: Markedly hypermetabolic focal masslike thickening of the cervical esophagus, compatible with primary esophageal malignancy.   Markedly hypermetabolic right upper paratracheal lymph node, compatible with metastatic disease.   Small right upper paratracheal and right level IIA lymph nodes with mild hypermetabolic activity, concerning for additional sites of nodal metastatic disease.   Aortic Atherosclerosis (ICD10-I70.0).   03/09/2021 - 04/13/2021 Chemotherapy   Patient is on Treatment Plan : ESOPHAGUS Carboplatin /PACLitaxel  weekly x 6 weeks with XRT       05/21/2021 - 01/13/2022 Chemotherapy   Patient is on Treatment Plan : GASTROESOPHAGEAL Nivolumab  q14d x 8 cycles / Nivolumab  q28d     05/21/2021 - 05/05/2022 Chemotherapy   Patient is on Treatment Plan : GASTROESOPHAGEAL Nivolumab  (240) q14d x 8 cycles / Nivolumab  (480) q28d     07/27/2022 Imaging    IMPRESSION: 1. Stable chest CT. No evidence of metastatic disease or acute findings. 2.  Aortic Atherosclerosis  (ICD10-I70.0).   12/06/2022 Imaging   CT chest, abdomen, and pelvis with contrast   IMPRESSION: 1. No acute process or evidence of metastatic disease within the chest, abdomen, or pelvis. 2. Trace pelvic fluid, either new or increased since 04/06/2022. Nonspecific. 3. Possible bladder wall thickening and pericystic edema. Correlate with symptoms of cystitis. 4. Incidental findings, including: Coronary artery atherosclerosis. Aortic Atherosclerosis (ICD10-I70.0).       REVIEW OF SYSTEMS:   Constitutional: Denies fevers or chills. States that she has been slowly losing weight due to lack of desire to eat.  Eyes: Denies blurriness of vision Ears, nose, mouth, throat, and face: Denies mucositis or sore throat Respiratory: Denies cough, dyspnea or wheezes Cardiovascular: Denies  palpitation, chest discomfort or lower extremity swelling Gastrointestinal:  Denies nausea, heartburn or change in bowel habits. Frequent dysphagia.  Skin: Denies abnormal skin rashes Lymphatics: Denies new lymphadenopathy or easy bruising Neurological:Denies numbness, tingling or new weaknesses Behavioral/Psych: Mood is stable, no new changes  All other systems were reviewed with the patient and are negative.   VITALS:   Today's Vitals   10/10/23 0916 10/10/23 0938  BP: 130/70   Pulse: (!) 59   Resp: 17   Temp: 97.7 F (36.5 C)   SpO2: 96%   Weight: 130 lb 1.6 oz (59 kg)   PainSc:  0-No pain   Body mass index is 21 kg/m.   Wt Readings from Last 3 Encounters:  10/10/23 130 lb 1.6 oz (59 kg)  09/19/23 129 lb 3.2 oz (58.6 kg)  08/11/23 127 lb 3.2 oz (57.7 kg)    Body mass index is 21 kg/m.  Performance status (ECOG): 1 - Symptomatic but completely ambulatory  PHYSICAL EXAM:   GENERAL:alert, no distress and comfortable SKIN: skin color, texture, turgor are normal, no rashes or significant lesions EYES: normal, Conjunctiva are pink and non-injected, sclera clear OROPHARYNX:no exudate, no  erythema and lips, buccal mucosa, and tongue normal. She is hoarse.  NECK: supple, thyroid  normal size, non-tender, without nodularity LYMPH:  no palpable lymphadenopathy in the cervical, axillary or inguinal LUNGS: clear to auscultation and percussion with normal breathing effort HEART: regular rate & rhythm and no murmurs and no lower extremity edema ABDOMEN:abdomen soft, non-tender and normal bowel sounds Musculoskeletal:no cyanosis of digits and no clubbing  NEURO: alert & oriented x 3 with fluent speech, no focal motor/sensory deficits  LABORATORY DATA:  I have reviewed the data as listed    Component Value Date/Time   NA 141 10/10/2023 0842   NA 136 03/24/2021 1508   K 4.4 10/10/2023 0842   CL 102 10/10/2023 0842   CO2 35 (H) 10/10/2023 0842   GLUCOSE 117 (H) 10/10/2023 0842   GLUCOSE 97 03/17/2006 0820   BUN 33 (H) 10/10/2023 0842   BUN 26 03/24/2021 1508   CREATININE 1.58 (H) 10/10/2023 0842   CALCIUM 9.8 10/10/2023 0842   PROT 6.6 10/10/2023 0842   PROT 6.2 03/24/2021 1508   ALBUMIN 4.2 10/10/2023 0842   ALBUMIN 4.2 03/24/2021 1508   AST 20 10/10/2023 0842   ALT 15 10/10/2023 0842   ALKPHOS 69 10/10/2023 0842   BILITOT 0.8 10/10/2023 0842   GFRNONAA 32 (L) 10/10/2023 0842   GFRAA 62 07/08/2020 1653    Lab Results  Component Value Date   WBC 4.7 10/10/2023   NEUTROABS 3.2 10/10/2023   HGB 13.3 10/10/2023   HCT 39.7 10/10/2023   MCV 90.8 10/10/2023   PLT 171 10/10/2023    Addendum I have seen the patient, examined her. I agree with the assessment and and plan and have edited the notes.   Trenia Fritter is concerned about her dysphagia, which is slightly worse than before.  She is still able to tolerate most oral food, but does have regurgitation if she eats too much or does not chew well.  She was told by her GI doctor Guessner that more esophageal stretching is not feasible due to the increased risk of perforation.  She is concerned about tumor recurrence.  Will  obtain a PET scan and ctDNA Signatera test, to see if she has evidence of residual or recurrent disease.  Will reach out to Dr. Willy Harvest if she needs EGD. All questions were answered.  Sonja New Home MD 10/10/2023

## 2023-10-09 NOTE — Assessment & Plan Note (Addendum)
 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 EGD 9/15 and CT neck 02/05/21 showed 6 cm mass in the cervical/upper esophagus with possible local adenopathy. 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 Deloise Ferries felt she was not a surgical candidate.  -she received concurrent chemoRT with carbo/taxol  03/08/21 - 04/14/21.  -given PDL1+, she began immunotherapy with Nivolumab  on 05/21/21, and completed 1 year therapy in December 2023. -last EGD in November 2023 showed benign stenosis, no evidence of recurrence or residual disease. -restaging CT CAP on 07/27/2022 showed no evidence of disease. --12/06/2022 Ct Cap showed no evidence of metastatic disease. Recommended repeat CT Cap in 1 year (11/2023). Has been clinically doing well.  03/2023 -CT of soft tissue neck was negative for evidence of metastases and showed good response to prior treatment. -Swallow test and esophagram were done 05/23/2023.  These showed mild esophageal dysmotility, but otherwise normal exams. -10/10/2023 - Due to worsening dysphagia, will get PET CT and ctDNA Signatera test to evaluate for metastatic disease or recurrence. Will contact GI, Dr. Willy Harvest, for guidance regarding EGD and esophageal dilation. For now, recommend full liquid diet, avoiding solid foods as much as possible. She should carefully chew any solid foods. Will followu p with patient In 3-4 weeks to discuss all results and treatment recommendations.

## 2023-10-10 ENCOUNTER — Inpatient Hospital Stay (HOSPITAL_BASED_OUTPATIENT_CLINIC_OR_DEPARTMENT_OTHER): Admitting: Nurse Practitioner

## 2023-10-10 ENCOUNTER — Inpatient Hospital Stay: Attending: Nurse Practitioner

## 2023-10-10 VITALS — BP 130/70 | HR 59 | Temp 97.7°F | Resp 17 | Wt 130.1 lb

## 2023-10-10 DIAGNOSIS — Z9221 Personal history of antineoplastic chemotherapy: Secondary | ICD-10-CM | POA: Insufficient documentation

## 2023-10-10 DIAGNOSIS — R1310 Dysphagia, unspecified: Secondary | ICD-10-CM | POA: Diagnosis not present

## 2023-10-10 DIAGNOSIS — C153 Malignant neoplasm of upper third of esophagus: Secondary | ICD-10-CM | POA: Insufficient documentation

## 2023-10-10 LAB — CBC WITH DIFFERENTIAL (CANCER CENTER ONLY)
Abs Immature Granulocytes: 0.01 10*3/uL (ref 0.00–0.07)
Basophils Absolute: 0.1 10*3/uL (ref 0.0–0.1)
Basophils Relative: 1 %
Eosinophils Absolute: 0.2 10*3/uL (ref 0.0–0.5)
Eosinophils Relative: 5 %
HCT: 39.7 % (ref 36.0–46.0)
Hemoglobin: 13.3 g/dL (ref 12.0–15.0)
Immature Granulocytes: 0 %
Lymphocytes Relative: 16 %
Lymphs Abs: 0.8 10*3/uL (ref 0.7–4.0)
MCH: 30.4 pg (ref 26.0–34.0)
MCHC: 33.5 g/dL (ref 30.0–36.0)
MCV: 90.8 fL (ref 80.0–100.0)
Monocytes Absolute: 0.4 10*3/uL (ref 0.1–1.0)
Monocytes Relative: 8 %
Neutro Abs: 3.2 10*3/uL (ref 1.7–7.7)
Neutrophils Relative %: 70 %
Platelet Count: 171 10*3/uL (ref 150–400)
RBC: 4.37 MIL/uL (ref 3.87–5.11)
RDW: 12.8 % (ref 11.5–15.5)
WBC Count: 4.7 10*3/uL (ref 4.0–10.5)
nRBC: 0 % (ref 0.0–0.2)

## 2023-10-10 LAB — CMP (CANCER CENTER ONLY)
ALT: 15 U/L (ref 0–44)
AST: 20 U/L (ref 15–41)
Albumin: 4.2 g/dL (ref 3.5–5.0)
Alkaline Phosphatase: 69 U/L (ref 38–126)
Anion gap: 4 — ABNORMAL LOW (ref 5–15)
BUN: 33 mg/dL — ABNORMAL HIGH (ref 8–23)
CO2: 35 mmol/L — ABNORMAL HIGH (ref 22–32)
Calcium: 9.8 mg/dL (ref 8.9–10.3)
Chloride: 102 mmol/L (ref 98–111)
Creatinine: 1.58 mg/dL — ABNORMAL HIGH (ref 0.44–1.00)
GFR, Estimated: 32 mL/min — ABNORMAL LOW (ref >60)
Glucose, Bld: 117 mg/dL — ABNORMAL HIGH (ref 70–99)
Potassium: 4.4 mmol/L (ref 3.5–5.1)
Sodium: 141 mmol/L (ref 135–145)
Total Bilirubin: 0.8 mg/dL (ref 0.0–1.2)
Total Protein: 6.6 g/dL (ref 6.5–8.1)

## 2023-10-11 ENCOUNTER — Other Ambulatory Visit: Payer: Self-pay

## 2023-10-11 DIAGNOSIS — C153 Malignant neoplasm of upper third of esophagus: Secondary | ICD-10-CM

## 2023-10-11 NOTE — Progress Notes (Signed)
 As per Angela Bushy NP, order was placed in portal for Signatera paper work was uploaded with order. Received confirmation of receipt. Kit was placed in Lab to be drawn 05/22.  Patient was made aware of appointment.

## 2023-10-12 ENCOUNTER — Telehealth: Payer: Self-pay | Admitting: Internal Medicine

## 2023-10-12 ENCOUNTER — Inpatient Hospital Stay

## 2023-10-12 DIAGNOSIS — C153 Malignant neoplasm of upper third of esophagus: Secondary | ICD-10-CM

## 2023-10-12 LAB — GENETIC SCREENING ORDER

## 2023-10-12 NOTE — Telephone Encounter (Unsigned)
 Copied from CRM [id number]. Topic: Clinical - Medication Refill >> Oct 12, 2023 10:42 AM Vivian Z wrote: Medication: carvedilol  (COREG ) 25 MG tablet  Has the patient contacted their pharmacy? Yes (Agent: If no, request that the patient contact the pharmacy for the refill. If patient does not wish to contact the pharmacy document the reason why and proceed with request.) (Agent: If yes, when and what did the pharmacy advise?)  This is the patient's preferred pharmacy:  [facility] [address], [place] - [address]  Phone: [phone] Fax:[phone]  Is this the correct pharmacy for this prescription? Yes If no, delete pharmacy and type the correct one.   Has the prescription been filled recently? No  Is the patient out of the medication? Yes  Has the patient been seen for an appointment in the last year OR does the patient have an upcoming appointment? Yes  Can we respond through MyChart? No  Agent: Please be advised that Rx refills may take up to 3 business days. We ask that you follow-up with your pharmacy.

## 2023-10-13 ENCOUNTER — Encounter (HOSPITAL_COMMUNITY)
Admission: RE | Admit: 2023-10-13 | Discharge: 2023-10-13 | Disposition: A | Discharge status: Home / Self Care | Source: Ambulatory Visit | Attending: Nurse Practitioner

## 2023-10-13 ENCOUNTER — Telehealth: Payer: Self-pay

## 2023-10-13 DIAGNOSIS — C153 Malignant neoplasm of upper third of esophagus: Secondary | ICD-10-CM | POA: Insufficient documentation

## 2023-10-13 LAB — GLUCOSE, CAPILLARY: Glucose-Capillary: 88 mg/dL (ref 70–99)

## 2023-10-13 MED ORDER — FLUDEOXYGLUCOSE F - 18 (FDG) INJECTION
6.5000 | Freq: Once | INTRAVENOUS | Status: AC | PRN
  Administered 2023-10-13: 6.39 via INTRAVENOUS

## 2023-10-13 MED ORDER — CARVEDILOL 25 MG PO TABS: ORAL_TABLET | ORAL | 2 refills | Status: DC

## 2023-10-13 NOTE — Telephone Encounter (Signed)
 Copied from CRM [id number]. Topic: Clinical - Medication Question >> Oct 13, 2023 10:55 AM Bambi Bonine D wrote: Reason for CRM: Pt is calling to check the status of her medication refill for the carvedilol  (COREG ) 25 MG tablet. Pt stated that she is out of the medication and needs to have it refilled today if possible. Pt would like to receive a call back with an update on the medication.

## 2023-10-13 NOTE — Telephone Encounter (Signed)
 Rx sent. And pt is aware.

## 2023-10-18 ENCOUNTER — Telehealth: Payer: Self-pay | Admitting: Adult Health

## 2023-10-18 NOTE — Telephone Encounter (Signed)
 Pt called stating that Pharmacy is request New rx be placed for pt Medication . ( lamoTRIgine  (LAMICTAL ) 100 MG tablet )Pt recently switched Pharmacy's and medication got lost in between that switch . Pt was informed that neither pharmacy has the medication.    Pt would like medication sent to  Pharmacy :[facility] [address] [place] - [address]

## 2023-10-19 ENCOUNTER — Encounter: Payer: Self-pay | Admitting: Dermatology

## 2023-10-19 NOTE — Telephone Encounter (Signed)
 Pt has returned call to RN

## 2023-10-19 NOTE — Telephone Encounter (Signed)
 I walked away from desk for a moment and when got on no answer.  I called her back and no answer.  She called back and was able to speak to pt.  She says that she would like to increase her lamotrigine  to 350mg  daily.  She has been taking 150mg  po bid.  My initial call was to call to [facility] and needed to clarify her dose. Are you ok to go to 350mg  total daily?    She as appt with Dr. Otha Blight PA on 10-24-2023.  She did see PM at [facility] medical name unknown(she mentioned clonazepam and oxycodone ).

## 2023-10-19 NOTE — Telephone Encounter (Signed)
 I called pt and LMVM for her that was going to send prescription for her but I wanted to make sure about her dose.  Last note states 150mg  po bid.  And script says 100mg  po bid.

## 2023-10-20 NOTE — Telephone Encounter (Signed)
 We have tried higher doses of Lamictal  in the past but she has not been able to tolerate it as she reported that it affected her cognition and her gait and balance.  If she saw pain management did they offer her any treatment options to control her discomfort?  If so, has she tried it?

## 2023-10-23 ENCOUNTER — Telehealth: Payer: Self-pay | Admitting: Adult Health

## 2023-10-23 MED ORDER — LAMOTRIGINE 100 MG PO TABS: 150.0000 mg | ORAL_TABLET | Freq: Two times a day (BID) | ORAL | 1 refills | Status: DC

## 2023-10-23 NOTE — Telephone Encounter (Signed)
 Duplicate. Merged into other phone note.

## 2023-10-23 NOTE — Telephone Encounter (Signed)
 Pt called stating that there was a mistake on her refill for her lamoTRIgine  (LAMICTAL ) 100 MG tablet  Pt was informed that in the past she was not able to tolerate a higher dose. Pt stated that this was not true and she is needing the higher dose called in for her. Please advise.

## 2023-10-23 NOTE — Addendum Note (Signed)
 Addended by: Viktoria Gray on: 10/23/2023 10:54 AM   Modules accepted: Orders

## 2023-10-23 NOTE — Telephone Encounter (Signed)
 I called pt back.  She said that lamotrigine  has been the one medication that she has been able to tolerate.  She is taking 300mg  po daily (your last note 150mg  po bid).  She wants to increase to 350mg  daily.  She does not want to increase but she cannot tolerate the pain.  She states is comes out of nowhere.  She has had instances 1)where her book club leader cleared the room due to her pain and then also 2) lunch with friends (had to leave.   She said that she has had gamma knife x 2 (can have up to  3-4 x), apparently there is a time frame they give pt to see if will work prior to another treatment. She has tele health from NP tomorrow 10-24-2023 with Dr. Otha Blight office.   Regarding her PM referral to [facility] medical.  She said the physician she saw offered oxycodone  (which pt states she gets rebound headaches), and clonazepam for nerve pain.  But pt chose not to take, (Dr said that she see's short term for this issue, not  longterm).     She needs prescription escribed to [facility].  I have started the prescription for 150mg  po bid,

## 2023-10-23 NOTE — Telephone Encounter (Addendum)
 I spoke to Tasley, NP and she reiterates that it was placed in her notes about higher lamotrigine  doses caused cognitive worsening so that is not something she wants to do.  We would like to request that you sign a release for [facility] to fax us  there initial office note.  Also I know that she has phone/video visit with Dr. Otha Blight PA with gamma knife 10-24-2023 and to mention if they have a pain management dept.  [facility] management for this diagnosis is a short term issue not longterm).  I sent in her prescription for lamotrigine  150mg  po bid to [facility].

## 2023-10-23 NOTE — Telephone Encounter (Signed)
 Pt called back.  I reiterated that she is to remain at lamotrigine  150mg  po bid as last ordered.  (Pt maybe taking 100mg  po tid).   I relayed for her to check with Dr. Otha Blight PA about pain manangement at his office.  Also signing  release for us  to get office note at [facility] to view last OV note.  Pt verbalized understanding to all above recommendations.

## 2023-10-23 NOTE — Telephone Encounter (Signed)
 [facility] [phone] - tech Raya asking for a call to clarify how many times a day pt is to take lamoTRIgine  (LAMICTAL ) 100 MG tablet, pt telling them she is to take 3 times a day, please call.

## 2023-10-23 NOTE — Telephone Encounter (Signed)
 [name] routed conversation to Gna-Pod 4 Calls36 minutes ago (9:15 AM)   Angela Sobers M36 minutes ago (9:15 AM)   [facility] [employer] [phone] - tech Leary asking for a call to clarify how many times a day pt is to take lamoTRIgine  (LAMICTAL ) 100 MG tablet, pt telling them she is to take 3 times a day, please call.      Note   [facility] [address], [place] - [address] [phone]  Angela Sobers M38 minutes ago (9:13 AM)

## 2023-10-23 NOTE — Addendum Note (Signed)
 Addended by: Viktoria Gray on: 10/23/2023 02:17 PM   Modules accepted: Orders

## 2023-10-24 NOTE — Telephone Encounter (Signed)
 Just an fyi. If you feel that we should increase lamictal  let me know- I just didn't feel comfortable with that since she has reported side effects in that past

## 2023-10-26 NOTE — Telephone Encounter (Signed)
 Yes I did place a referral the last time I saw her and she went but sounds like she didn't want to try what they recommending?

## 2023-10-29 ENCOUNTER — Telehealth: Payer: Self-pay | Admitting: Neurology

## 2023-10-29 MED ORDER — TRAMADOL HCL 50 MG PO TABS: 50.0000 mg | ORAL_TABLET | Freq: Four times a day (QID) | ORAL | 1 refills | Status: DC | PRN

## 2023-10-29 NOTE — Telephone Encounter (Signed)
 I spoke with patient today, she is still in tremendous pain with her [name] and could only talk for short periods and needed to rest. Hasn't brushed her teeth in 3 days. Increases of Lamictal  causes her to be unsteady. And she the oxycodne in the past also gave her side effects. We have tried medications extensively and sent her to pain management for ideas in the past, she has seen Dr. Otha Blight at [facility] for procedures. She has been in touch with Dr. Otha Blight. She states she has taken tramadol  in the past and so I will give her a short term prescription and we can see her in the office this week. Shooting down the jaw and into her jaw. She is going to call Dr. Otha Blight again and we should get her into the office. Megan le's discuss tomorrow what else we can posisbly do for patient.

## 2023-10-31 NOTE — Telephone Encounter (Signed)
 I may recommend following up with Dr. Otha Blight and pain management. I will call patient today

## 2023-11-01 NOTE — Telephone Encounter (Signed)
 I called patient, no answer, left a message. I am seeing her next week but I wanted to know how she was doing. I gave her some tramadol , she was going to call Dr. Cyndie Dredge office as well. If she is still suffering we can increase her Lamictal  as she requested.   Pod 4: Please call and see if she is still suffering, then we can increase her Lamictal  until she sees me in the office next week thank you  [name] _ can we get the pain management notes?

## 2023-11-01 NOTE — Telephone Encounter (Signed)
 Pt called stating she spoke with Dr Tresia Fruit on 6-8 and that Dr Tresia Fruit informed her she wants to see her this week, phone rep offered pt an appointment for 6-16, pt accepted and asked it be documented for Dr Tresia Fruit to see that she has made this appointment.

## 2023-11-02 MED ORDER — LAMOTRIGINE 25 MG PO TABS: ORAL_TABLET | ORAL | 1 refills | Status: DC

## 2023-11-02 NOTE — Telephone Encounter (Signed)
 I called the patient back and informed her that Dr. Tresia Fruit is okay with increasing her Lamotrigine  to a total of 350 mg daily.  We will send a 25 mg tablet to her pharmacy where she will need to take 1 tablet twice daily with her existing dose of 150 mg for a total of 175 mg twice a day.  The patient verbalized understanding and her questions were answered.  I sent this prescription to the pharmacy per v.o. Dr Tresia Fruit.

## 2023-11-02 NOTE — Telephone Encounter (Signed)
 Spoke with patient. She states the pain is still there and she would like to increase her Lamotrigine  from a total of 300 mg daily to 350 mg daily. She is currently taking 150 mg BID.

## 2023-11-02 NOTE — Telephone Encounter (Signed)
 Yes please order

## 2023-11-02 NOTE — Addendum Note (Signed)
 Addended by: Burns Carwin on: 11/02/2023 12:10 PM   Modules accepted: Orders

## 2023-11-06 ENCOUNTER — Encounter: Payer: Self-pay | Admitting: Neurology

## 2023-11-06 ENCOUNTER — Ambulatory Visit (INDEPENDENT_AMBULATORY_CARE_PROVIDER_SITE_OTHER): Admitting: Neurology

## 2023-11-06 VITALS — BP 134/70 | HR 58 | Ht 66.0 in | Wt 127.4 lb

## 2023-11-06 DIAGNOSIS — G500 Trigeminal neuralgia: Secondary | ICD-10-CM | POA: Insufficient documentation

## 2023-11-06 MED ORDER — LEVETIRACETAM 500 MG PO TABS
ORAL_TABLET | ORAL | 6 refills | Status: DC
Start: 2023-11-06 — End: 2024-04-15

## 2023-11-06 NOTE — Progress Notes (Signed)
 PATIENT: [name] DOB: Jul 13, 1940  REASON FOR VISIT: follow up HISTORY FROM: patient PRIMARY NEUROLOGIST:   HISTORY OF PRESENT ILLNESS:  Patient has been seeing [name] for her [name] since I last saw her, we sent her to pain management and she did not like the options presented to her(acute opioids, she cannot take them), we sent her back to [facility] Dr. Otha Barrera, she has tried and failed a plthora of medication as below. She has gamma knife at [facility]. We did not want to increase lamictal  due to past side effects but patient insisted it is the only medication to help her and she was very much in pain. Seeing her back in the office today.  She reached Dr. Otha Barrera and spoke to his PA they had nothing further for her; They told her it may take a while and had gamma knife a year ago and recently with Dr. Otha Barrera right before the exacerbation happened. Had RIGHT MVD 2013 and great relief for 9 years, She underwent GKRS right TN on 01/05/2021 by Dr. Otha Barrera. Following GKRS, she has had an excellent pain reduction after about 6 months and continued to be pain-free until 6 weeks ago when her classic severe lancinating shock-like right facial pain recurred. Of note, [name] developed right facial numbness and tingling after her initial GKRS which she finds somewhat bothersome (but not painful). Today [name] is 2 months s/p GKRS for recurrent RIGHT 08/22/2023 by Dr. Otha Barrera She had the severe pain this morning about 230am. We increased her lamictal  but have warned in the past she had side effects, she used the tramadol  which helps as well. She is taking Lamictal  100mg  1.5 tabs morning and at night. Mid day take 25mg -50mg  as she takes it now, just make sure not taking more than 350mg  in a day. Not getting better will add Keppra . Was on Keppra  250mg  twice daily without side effects but did not help so will increase: Start with one tab daily (500mg )and in 1  weeks, if no side  effects, may increase to 2 times daily can stop the keppra  if feleing better maybe the gamma knife is starting to help.no ams. No hallucintions, walking with a cane   Patient complains of symptoms per HPI as well as the following symptoms: imbalance . Pertinent negatives and positives per HPI. All others negative  03/17/2022: Patient with H&N cancer, we have been treating her for [name] and exhasuted most m Lovely 83year old femle with severe pain. She has sufferered with trigeminal neuralgia ([name]) since 2008, in 2013 had microvascular decompression at [facility] and did great for 9 years with recurrent [name](may need revision?). Very difficult case, patient had hyponatremia with Tegretol , tried her on Trileptal , baclofen ,lamictal  abd have worried about side effects of having multiple AEDs or baclofen  in an 83 year old. tried nerve blocks . I'ver tried botox with other patients in the past with poor results, and risk of affecting CN 7,Had gamma knife as well. Other options include gamma knife, balloon, rhizotomy or others and referred to Dr. Otha Barrera at [facility]. Discussed at length with patient. She has been a poor medication taker, adjusting her dose without letting us  know.Tried amitriptyline , decadron , amitriptyline , nerve blocks, keppra , nerve bkocks, muscle relaxers, nortriptyline , nycynta, trileptal ,baclofen ,oxy, tizanidine , cymbalta , topiramate , effexor ,tramadol . Her last procedure was Gamma knife 12/2020 then at [facility] in 12/2021. She's been to multiple neurosurgeons including [facility].  She thinks the cancer is under control. She couldn't eat for  a while. She is having very different symptoms, the [name] seems to have reduced. Just on the lamictal  now and doing well at 300mg  a day. [name] was always on the right but was always painful. Now having different symptoms more tingling in her cheek bone and above the right chin which is different. Not like trigeminal neuralgia.WOuld repeat MRi brain esp in the setting  of head and neck cancer to rule out amy metastasis or spreading to the brain but this is likely remnants of trigaminal neuropathy/neuralgia.swelling in the right side of the face as well.  Patient complains of symptoms per HPI as well as the following symptoms: facial numbness and swelling . Pertinent negatives and positives per HPI. All others negative  BUN 26, creat 0.93  Today 08/12/2021: [name] is an 83 year old female with a history of Trigeminal Neuralgia. She returns today for follow-up. Reports that she is having breakthrough pain in the right jaw.  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  She states that she starts coughing for a long period of time the pain occurs. Unable to tolerate high dose of oxycarbamazepine.  She states that she does notices that she has been on these medications her balance is slightly off and she feels a little foggy headed.  Currently taking Lamictal  250 mg BID and Oxycarbamazine 150 mg BID  11/2/22[name] is a 83 y.o. female with a history of trigeminal neuralgia here today for a follow up. Patient had Gamma Knife Procedure in August, she had some initial relief then she started having breakthrough pain. Having breakthrough TN episodes nearly daily, they are worse in the evenings. She states that she rarely gets it during sleep, and last night she had woken from her sleep with terrible pain.  Patient endorses that the oxcarbamazepine starting causing side effects such as mobility (initially using cane, now has to use walker), vision changes (diminished vision, double vision (line and dots) and cognitive issues. She stated when she had decreased her dose to 150 mg BID, she noted an increased improvement of all above and her ambulation has improved slightly as well. Her husband then said that due to breakthru pain he increased her lamotrigine  to 200mg  po bid, they have done this for three days.   Patient would like to see  Dr. Nigel Bart, unfortunately he is out of the office for the next 12 weeks.Encouraged patient to reach out to his office to determine if there is another provider that specializes in those treatments in his office.   HISTORY  07/08/2020:   Referral to Dr. Otha Barrera at [facility] and then also to increase lamotrigine  to 200mg  po bid. She has breakthrough pain. It gets worse around 8pm. Just before that 8pm deadline it gets bad and when she eats it breaks through. She has an appointment with Dr. laxton on 6/8. She has to bring all imaging. She had vascular decompression in 2013, lasted 9 years, may need revision?    Patient is here on concern of memory changes. She says in the last month she has gotten much better as far as the sciatica goes. She can walk around te house. She uses a Youth worker around the house. She did therapy last spring, and so she is trying to strengthen the muscles in her legs. 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 She is very lucid right now but in the morning she gets unstable and is confused. She can almost fall after she takes her medicine. She feels fantastic now on the oxcarb and lamictal .    MRI brain: No evidence of recent infarction, hemorrhage, or mass.  Stable postoperative changes of prior trigeminal nerve decompression.Stable mild chronic microvascular ischemic changes' . MRI cervical spine:  No abnormal cord signal.  Multilevel degenerative changes as detailed above. No high-grade canal stenosis. Foraminal narrowing is greatest on the left at C5-C6.   TSH 0.88, B12 933,    Patient complains of symptoms per HPI as well as the following symptoms: imbalance, falls . Pertinent negatives and positives per HPI. All others negative   05/11/2020: She is  taking more Lamictal . She had the procedure with Dr. Nigel Bart, her right face was numb for some time, there ois discomfort now, once in a while she gets the lightning strike, the procedure made a huge difference. She is on 100mg  twice a day of the Lamictal .    [name] is a 83 y.o. female here as requested by [name], Wanda K, MD for Trigeminal neuralgia. 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 Haig Levan in the past, remote kidney infection, hypertension, hyperlipidemia, hard of hearing, fatty liver, anxiety and depression.  I reviewed Dr. Clint Dana notes, she was seen there for evaluation of bilateral leg pain, increased leg pain left greater than right 6 months, weakness in both legs, she had epidural steroid injections x3, most recently left L4-L5 in November with no relief, numerous medications trialed most recently Cymbalta , Tylenol , Motrin .  Patient is on carbamazepine  500 mg twice daily for trigeminal neuralgia for which she underwent craniotomy and microvascular decompression at the [facility]  She was referred here for trigeminal neuralgia, I reviewed her neurologic examination which included normal strength, normal tone, normal reflexes, normal cranial nerve exams, Lhermitte's negative, Romberg negative no pronator drift, Hoffmann's normal, no clonus, diagnosed with lumbar stenosis with synovial cyst causing severe low back pain and bilateral lower extremity pain with left greater than right leg syndromes, L4-L5 laminectomy with resection of synovial cyst to be performed, referred over here for trigeminal neuralgia. Reviewed Dr. Scotty Cyphers notes, patient sodium was affected on Tegretol .    She had surgery at [facility] clinic in 2013 for trigeminal neuralgia, she was told hopefully the surgery would help but since then she had been  increasing the medication and increased to 1200mg  a day and was decreased due to blood work and side effects, and now she has stopped the carbamazepine . She was getting side effects from the carbamazepine  but it was helping. Dr. Nigel Bart placed her on Oxcarbazepine (trileptal ) and now she is on cymbalta , she started the Trileptal  a few weeks ago. She was started on 300mg  and just a few days ago she was increased to 600mg  twice a day(1200mg  a day). She is not taking the cymbalta , was likely prescribed for low back pain (Dr. Daisey Dryer). The pain starts on the right and shoots down her jaw, chewing, talking makes it worse, severe, shooting, same pain she has always experienced. The pain is severe today. She says she is unstable and tired, gets better after 45 minutes. When she did the clock test she could not draw the clock. No coordination.  All the numbers were in sequence. She could not draw.      REVIEW OF SYSTEMS: Out of a complete 14 system review of symptoms, the patient complains only of the following symptoms, and all other reviewed systems are negative.  ALLERGIES: Allergies  Allergen Reactions   Other Rash   Bactrim [Sulfamethoxazole-Trimethoprim] Hives and Itching   Hydrocodone  Other (See Comments)    Rebound headaches   Sulfa Antibiotics Other (See Comments)    HOME MEDICATIONS: Outpatient Medications Prior to Visit  Medication Sig Dispense Refill   acetaminophen  (TYLENOL ) 500 MG tablet Take 500-1,000 mg by mouth every 4 (four) hours as needed for headache, mild pain or moderate pain.     albuterol  (VENTOLIN  HFA) 108 (90 Base) MCG/ACT inhaler INHALE 1 TO 2 PUFFS INTO THE LUNGS EVERY 6 HOURS AS NEEDED FOR WHEEZING OR SHORTNESS OF BREATH 6.7 g 0   Calcium Carb-Cholecalciferol 600-20 MG-MCG TABS Take by mouth.     carvedilol  (COREG ) 25 MG tablet TAKE 1 TABLET(25 MG) BY MOUTH TWICE DAILY WITH A MEAL 60 tablet 2   conjugated estrogens  (PREMARIN ) vaginal cream Place 1 applicator vaginally once a  week.     estradiol (ESTRACE) 0.1 MG/GM vaginal cream APPLY 1/2 VAGINALLY AT BEDTIME TWICE WEEKLY     famotidine  (PEPCID ) 20 MG tablet One after supper 30 tablet 11   fluticasone  (FLONASE ) 50 MCG/ACT nasal spray Place 2 sprays into both nostrils 2 (two) times daily. 16 g 6   furosemide  (LASIX ) 20 MG tablet TAKE 1 TABLET(20 MG) BY MOUTH DAILY 90 tablet 0   lamoTRIgine  (LAMICTAL ) 100 MG tablet Take 1.5 tablets (150 mg total) by mouth 2 (two) times daily. 90 tablet 1   lamoTRIgine  (LAMICTAL ) 25 MG tablet Take 1 tablet by mouth twice daily. Take this in addition to your already prescribed 150 mg dose for new total of 175 mg twice daily. 60 tablet 1   loperamide  (IMODIUM ) 2 MG capsule 2 mg daily as needed for diarrhea or loose stools. Per tube     Multiple Minerals-Vitamins (CAL-MAG-ZINC -D PO) Take 1 tablet by mouth 2 (two) times daily.     Multiple Vitamin (MULTIVITAMIN WITH MINERALS) TABS tablet Place 1 tablet into feeding tube in the morning.     Nutritional Supplements (NUTREN 1.5) LIQD Place 237 mLs into feeding tube daily.     omeprazole  (PRILOSEC) 40 MG capsule Take 30- 60 min before your first and last meals of the day 180 capsule 1   ondansetron  (ZOFRAN -ODT) 4 MG disintegrating tablet DISSOLVE 1 TO 2 TABLETS(4 TO 8 MG) ON THE TONGUE EVERY 8 HOURS AS NEEDED FOR NAUSEA OR VOMITING 30 tablet 1   Polyethyl Glycol-Propyl Glycol (SYSTANE) 0.4-0.3 % GEL ophthalmic gel Place 1 application into both eyes 2 (two) times daily as needed (dry eyes).     prochlorperazine  (COMPAZINE ) 10 MG tablet TAKE 1 TABLET(10 MG) BY MOUTH EVERY 6 HOURS AS NEEDED FOR NAUSEA OR VOMITING 30 tablet 1   triamcinolone cream (KENALOG) 0.1 % Apply 1 Application topically 2 (two) times daily.     traMADol  (ULTRAM ) 50 MG tablet Take 1 tablet (50 mg total) by mouth every 6 (six) hours as needed for up to 8 doses. 8 tablet 0   traMADol  (ULTRAM ) 50 MG tablet Take 1-2 tablets (50-100 mg total) by mouth every 6 (six) hours as needed. 42  tablet 1   fosfomycin (MONUROL) 3 g PACK TAKE 1 PACKET BY MOUTH FOR 1 DOSE.     guaiFENesin -codeine  100-10 MG/5ML  syrup PLACE INTO FEEDING TUBE TWICE DAILY FOR COUGH (Patient taking differently: Take 5 mLs by mouth every 6 (six) hours as needed for cough.) 236 mL 0   No facility-administered medications prior to visit.    PAST MEDICAL HISTORY: Past Medical History:  Diagnosis Date   Abdominal pain 05/29/2013   s/p rx of cephalo resistant e coli   but last rx NG  now residular ?  bladder sx repeat cx sx rx to ty and uro consult    ADJ DISORDER WITH MIXED ANXIETY & DEPRESSED MOOD 03/03/2010   Qualifier: Diagnosis of  By: Ethel Henry MD, Joaquim Muir    Agent resistant to multiple antibiotics 05/29/2013   e coli   bu NG on fu.     Anemia    Anxiety    ARF (acute renal failure) (HCC) 03/12/2015   Closed head injury 02/01/2011   from syncope and had scalp laceration  neg ct .     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 Emma Hardy in the past   Polycythemia    Positive PPD    when young    Pyelonephritis 03/12/2015   Sensation of pain in anesthetized distribution of trigeminal nerve    Syncope 02/01/2011   In shower on vacation  sustained head laceration  8 sutures Had ed visit neg head ct labs and x ray    Trigeminal neuralgia pain     PAST SURGICAL HISTORY: Past Surgical History:  Procedure Laterality Date   ABDOMINAL HYSTERECTOMY  2002   tubal   BACK SURGERY     2  times, for sciatic nerve pain   BALLOON DILATION  06/21/2021   Procedure: DILATION BALLOON used for esophageal stricture;  Surgeon: Normie Becton., MD;  Location: [facility];  Service: Gastroenterology;;   CARDIAC CATHETERIZATION  2000   chest pains neg   CHOLECYSTECTOMY N/A 02/21/2017   Procedure: LAPAROSCOPIC CHOLECYSTECTOMY WITH INTRAOPERATIVE CHOLANGIOGRAM;  Surgeon: Oralee Billow, MD;  Location: [facility];  Service: General;  Laterality: N/A;   COLONOSCOPY     multiple   CRANIOTOMY  12/09/2011   nerve decompression right trigeminal    DIRECT LARYNGOSCOPY N/A 02/12/2021   Procedure: DIRECT LARYNGOSCOPY AND BIOPSY POSSIBLE FROZEN;  Surgeon: Prescott Brodie, MD;  Location: [facility];  Service: ENT;  Laterality: N/A;   DOPPLER ECHOCARDIOGRAPHY  2009   nl lv function mild lv dilitation   ESOPHAGOGASTRODUODENOSCOPY (EGD) WITH PROPOFOL  N/A 06/21/2021   Procedure: ESOPHAGOGASTRODUODENOSCOPY (EGD) WITH PROPOFOL ;  Surgeon: Normie Becton., MD;  Location: [facility];  Service: Gastroenterology;  Laterality: N/A;  Request Fluoroscopy; Plan in for dilation   ESOPHAGOGASTRODUODENOSCOPY (EGD) WITH PROPOFOL  N/A 07/19/2021   Procedure: ESOPHAGOGASTRODUODENOSCOPY (EGD) WITH PROPOFOL ;  Surgeon: Brice Campi Albino Alu., MD;  Location: [facility];  Service: Endoscopy;  Laterality: N/A;  Dilation fluoro   ESOPHAGOGASTRODUODENOSCOPY (EGD) WITH PROPOFOL  N/A 08/05/2021   Procedure: ESOPHAGOGASTRODUODENOSCOPY (EGD) WITH PROPOFOL  - fluoro;  Surgeon: Mansouraty, Albino Alu., MD;  Location: [facility];  Service: Gastroenterology;  Laterality: N/A;   ESOPHAGOGASTRODUODENOSCOPY (EGD) WITH PROPOFOL  N/A 08/23/2021   Procedure: ESOPHAGOGASTRODUODENOSCOPY (EGD) WITH PROPOFOL ;  Surgeon: Kenney Peacemaker, MD;  Location: [facility];  Service: Gastroenterology;  Laterality: N/A;  Using Fluoroscopy and esophageal dilation   ESOPHAGOGASTRODUODENOSCOPY (EGD) WITH PROPOFOL  N/A 09/22/2021    Procedure: ESOPHAGOGASTRODUODENOSCOPY (EGD) WITH PROPOFOL ;  Surgeon: Kenney Peacemaker, MD;  Location: [facility];  Service: Gastroenterology;  Laterality: N/A;  EGD with savary and flour   ESOPHAGOGASTRODUODENOSCOPY (EGD) WITH PROPOFOL  N/A 12/07/2021   Procedure: ESOPHAGOGASTRODUODENOSCOPY (EGD) WITH PROPOFOL ;  Surgeon: Kenney Peacemaker, MD;  Location: [facility];  Service: Gastroenterology;  Laterality: N/A;  Schedule this with Fluroscopy, it is a savary dilation   ESOPHAGOGASTRODUODENOSCOPY (EGD) WITH PROPOFOL  N/A 04/05/2022   Procedure: ESOPHAGOGASTRODUODENOSCOPY (EGD) WITH PROPOFOL ;  Surgeon: Kenney Peacemaker, MD;  Location: [facility];  Service: Gastroenterology;  Laterality: N/A;  EGD /savary dilation with fluoro   EYE SURGERY Bilateral    ioc for catatracts   GAMMETE INTRAFALLOPIAN TUBE TRANFER Right 2025   GASTROSTOMY N/A 02/26/2021   Procedure: OPEN GASTROSTOMY TUBE PLACEMENT;  Surgeon: Oralee Billow, MD;  Location: [facility];  Service: General;  Laterality: N/A;   IR GASTROSTOMY TUBE MOD SED  02/22/2021   IR GASTROSTOMY TUBE REMOVAL  02/10/2022   IR REPLACE G-TUBE SIMPLE WO FLUORO  09/07/2021   laparoscopic gallbladder surgery  02/16/2017   Fax from [facility]   OOPHORECTOMY Bilateral 2002   rt shoulder surgery     SAVORY DILATION N/A 06/21/2021   Procedure: SAVORY DILATION;  Surgeon: Normie Becton., MD;  Location: [facility];  Service: Gastroenterology;  Laterality: N/A;   SAVORY DILATION N/A 07/19/2021   Procedure: SAVORY DILATION;  Surgeon: Brice Campi Albino Alu., MD;  Location: [facility];  Service: Endoscopy;  Laterality: N/A;   SAVORY DILATION N/A 08/05/2021   Procedure: SAVORY DILATION;  Surgeon: Brice Campi Albino Alu., MD;  Location: [facility];  Service: Gastroenterology;  Laterality: N/A;   SAVORY DILATION N/A 08/23/2021   Procedure: SAVORY DILATION;  Surgeon: Kenney Peacemaker, MD;  Location: [facility];  Service: Gastroenterology;   Laterality: N/A;   SAVORY DILATION N/A 09/22/2021   Procedure: SAVORY DILATION;  Surgeon: Kenney Peacemaker, MD;  Location: [facility];  Service: Gastroenterology;  Laterality: N/A;   SAVORY DILATION N/A 12/07/2021   Procedure: SAVORY DILATION;  Surgeon: Kenney Peacemaker, MD;  Location: [facility];  Service: Gastroenterology;  Laterality: N/A;   SAVORY DILATION N/A 04/05/2022   Procedure: SAVORY DILATION;  Surgeon: Kenney Peacemaker, MD;  Location: [facility];  Service: Gastroenterology;  Laterality: N/A;    FAMILY HISTORY: Family History  Problem Relation Age of Onset   Cancer Mother        uterine   Ovarian cancer Mother    Stroke Mother    Alcohol abuse Father    Stroke Father    Diabetes Brother    Cancer Paternal Aunt        leukemia, unknown type   Seizures Daughter    Hypertension Other    Colon cancer Neg Hx    Dementia Neg Hx    Alzheimer's disease Neg Hx     SOCIAL HISTORY: Social History   Socioeconomic History   Marital status: Married    Spouse  name: Not on file   Number of children: 2   Years of education: Not on file   Highest education level: Not on file  Occupational History    Comment: retired Customer service manager  Tobacco Use   Smoking status: Never   Smokeless tobacco: Never  Vaping Use   Vaping status: Never Used  Substance and Sexual Activity   Alcohol use: Yes    Comment: occ wine   Drug use: No   Sexual activity: Not on file  Other Topics Concern   Not on file  Social History Narrative   Married   HH of 2-3 (god daughter)   Pets 2 dogs   Non smoker    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, community   G2P2      Caffeine : 2 cups/day   Retired    Chief Executive Officer Drivers of Corporate investment banker Strain: Low Risk  (04/12/2022)   Overall Financial Resource Strain (CARDIA)    Difficulty of Paying Living Expenses: Not hard at all  Food Insecurity: No Food Insecurity (08/08/2022)   Hunger Vital Sign    Worried About Running Out of Food in the Last Year: Never true     Ran Out of Food in the Last Year: Never true  Transportation Needs: No Transportation Needs (08/08/2022)   PRAPARE - Administrator, Civil Service (Medical): No    Lack of Transportation (Non-Medical): No  Physical Activity: Insufficiently Active (04/12/2022)   Exercise Vital Sign    Days of Exercise per Week: 3 days    Minutes of Exercise per Session: 30 min  Stress: No Stress Concern Present (04/12/2022)   [employer] of Occupational Health - Occupational Stress Questionnaire    Feeling of Stress : Not at all  Social Connections: Moderately Isolated (04/12/2022)   Social Connection and Isolation Panel    Frequency of Communication with Friends and Family: More than three times a week    Frequency of Social Gatherings with Friends and Family: More than three times a week    Attends Religious Services: Never    Database administrator or Organizations: No    Attends Banker Meetings: Never    Marital Status: Married  Catering manager Violence: Not At Risk (04/12/2022)   Humiliation, Afraid, Rape, and Kick questionnaire    Fear of Current or Ex-Partner: No    Emotionally Abused: No    Physically Abused: No    Sexually Abused: No      Physical exam: Exam: Gen: NAD, conversant      CV: No palpitations or chest pain or SOB. VS: Breathing at a normal rate. Weight appears thin. Not febrile. Eyes: Conjunctivae clear without exudates or hemorrhage  Neuro: Detailed Neurologic Exam  Speech:    Speech is normal; fluent and spontaneous with normal comprehension.  Cognition:    The patient is oriented to person, place, and time;     recent and remote memory intact;     language fluent;     normal attention, concentration, fund of knowledge Cranial Nerves:    The pupils are equal, round, and reactive to light. Visual fields are full Extraocular movements are intact.  The face is symmetric with normal sensation. The palate elevates in the midline.  Hearing intact. Voice is normal. Shoulder shrug is normal. The tongue has normal motion without fasciculations.   Coordination: normal  Gait: Imbalanced, uses a cane, can get up independently, walks slowly  Motor Observation:   no involuntary  movements noted. Tone:    Appears normal  Posture:    Posture is normal. normal erect    Strength:    Strength is anti-gravity and symmetric in the upper and lower limbs.      Sensation: intact to LT, no reports of numbness or tingling or paresthesias        DIAGNOSTIC DATA (LABS, IMAGING, TESTING) - I reviewed patient records, labs, notes, testing and imaging myself where available.  Lab Results  Component Value Date   WBC 4.7 10/10/2023   HGB 13.3 10/10/2023   HCT 39.7 10/10/2023   MCV 90.8 10/10/2023   PLT 171 10/10/2023      Component Value Date/Time   NA 141 10/10/2023 0842   NA 136 03/24/2021 1508   K 4.4 10/10/2023 0842   CL 102 10/10/2023 0842   CO2 35 (H) 10/10/2023 0842   GLUCOSE 117 (H) 10/10/2023 0842   GLUCOSE 97 03/17/2006 0820   BUN 33 (H) 10/10/2023 0842   BUN 26 03/24/2021 1508   CREATININE 1.58 (H) 10/10/2023 0842   CALCIUM 9.8 10/10/2023 0842   PROT 6.6 10/10/2023 0842   PROT 6.2 03/24/2021 1508   ALBUMIN 4.2 10/10/2023 0842   ALBUMIN 4.2 03/24/2021 1508   AST 20 10/10/2023 0842   ALT 15 10/10/2023 0842   ALKPHOS 69 10/10/2023 0842   BILITOT 0.8 10/10/2023 0842   GFRNONAA 32 (L) 10/10/2023 0842   GFRAA 62 07/08/2020 1653   Lab Results  Component Value Date   CHOL 279 (H) 05/27/2019   HDL 118.80 05/27/2019   LDLCALC 145 (H) 05/27/2019   LDLDIRECT 135.9 10/16/2012   TRIG 78.0 05/27/2019   CHOLHDL 2 05/27/2019   Lab Results  Component Value Date   HGBA1C 5.2 11/09/2021   Lab Results  Component Value Date   VITAMINB12 933 (H) 07/01/2020   Lab Results  Component Value Date   TSH 2.302 04/07/2022     ASSESSMENT AND PLAN 83 y.o. year old female  has a past medical history of Abdominal  pain (05/29/2013), ADJ DISORDER WITH MIXED ANXIETY & DEPRESSED MOOD (03/03/2010), Agent resistant to multiple antibiotics (05/29/2013), Anemia, Anxiety, ARF (acute renal failure) (HCC) (03/12/2015), Closed head injury (02/01/2011), Closed head injury (5-6 yrs ago), Colitis (11/27/2017), Complication of anesthesia, Depression, esophageal ca (01/2021), Fatty liver, Gall stones (2016), GERD (gastroesophageal reflux disease), Hearing aid worn, HOH (hard of hearing), Hyperlipidemia, Hypertension, Kidney infection, Medication side effect (09/02/2010), Migraine, Polycythemia, Positive PPD, Pyelonephritis (03/12/2015), Sensation of pain in anesthetized distribution of trigeminal nerve, Syncope (02/01/2011), and Trigeminal neuralgia pain. here with:  Had RIGHT MVD 2013 and great relief for 9 years, She underwent GKRS right TN on 01/05/2021 by Dr. Otha Barrera. Following GKRS, she has had an excellent pain reduction after about 6 months and continued to be pain-free until 6 weeks ago when her classic severe lancinating shock-like right facial pain recurred. Of note, [name] developed right facial numbness and tingling after her initial GKRS which she finds somewhat bothersome (but not painful). Today [name] is 2 months s/p GKRS for recurrent RIGHT 08/22/2023 by Dr. Otha Barrera   - Exacerbation of severe pain(loses weight, can;t talk, can;t eat) s/p 2 months GKRS from Dr. Otha Barrera, may take several months, had great respons ein the past. We can continue Lamictal  350mg  daily but do not recommend more, had auditory and visual hallucinations and imbalance in the past. But is in so much pain, feels the benefits outweight the risk. Fall prevention, discussed - Failed multiple medications.  Tramadol  prn helps, take sparingly - She tried keppra  in the past, no side effects but also no relief with 250mg  bid. Can try again with a higher dose. Worried about imbalance and side effects, advised to stop for any side effects and  rassured her the [facility] may take more time tohelp with the pain (just 2 months post op) - follow up in 4 months with dr Tresia Fruit   Meds ordered this encounter  Medications   levETIRAcetam  (KEPPRA ) 500 MG tablet    Sig: Start with one tab daily (500mg )and in 1  weeks, if no side effects, may increase to 2 times daily    Dispense:  60 tablet    Refill:  6      Aldona Amel MD [facility] [address], [address] [place], [place] [address] [phone]    I spent over 45 minutes of face-to-face and non-face-to-face time with patient on the  1. 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  This included previsit chart review, lab review, study review, order entry, electronic health record documentation, patient education on the different diagnostic and therapeutic options, counseling and coordination of care, risks and benefits of management, compliance, or risk factor reduction

## 2023-11-06 NOTE — Patient Instructions (Addendum)
 On Lamictal  350mg  a day. Don;t go above 350mg  due to prior side effects Start with one tab daily (500mg )and in 1 weeks, if no side effects, may increase to 2 times daily Tramadol  as needed  Levetiracetam  Tablets What is this medication? LEVETIRACETAM  (lee ve tye RA se tam) prevents and controls seizures in people with epilepsy. It works by calming overactive nerves in your body. This medicine may be used for other purposes; ask your health care provider or pharmacist if you have questions. COMMON BRAND NAME(S): Keppra , Roweepra  What should I tell my care team before I take this medication? They need to know if you have any of these conditions: Kidney disease Suicidal thoughts, plans, or attempt by you or a family member An unusual or allergic reaction to levetiracetam , other medications, foods, dyes, or preservatives Pregnant or trying to get pregnant Breast-feeding How should I use this medication? Take this medication by mouth with a glass of water . Follow the directions on the prescription label. Swallow the tablets whole. Do not crush or chew this medication. You may take this medication with or without food. Take your doses at regular intervals. Do not take your medication more often than directed. Do not stop taking this medication or any of your seizure medications unless instructed by your care team. Stopping your medication suddenly can increase your seizures or their severity. A special MedGuide will be given to you by the pharmacist with each prescription and refill. Be sure to read this information carefully each time. Contact your care team about the use of this medication in children. While this medication may be prescribed for children as young as 73 years of age for selected conditions, precautions do apply. Overdosage: If you think you have taken too much of this medicine contact a poison control center or emergency room at once. NOTE: This medicine is only for you. Do not share  this medicine with others. What if I miss a dose? If you miss a dose, take it as soon as you can. If it is almost time for your next dose, take only that dose. Do not take double or extra doses. What may interact with this medication? This medication may interact with the following: Carbamazepine  Colesevelam Probenecid Sevelamer This list may not describe all possible interactions. Give your health care provider a list of all the medicines, herbs, non-prescription drugs, or dietary supplements you use. Also tell them if you smoke, drink alcohol, or use illegal drugs. Some items may interact with your medicine. What should I watch for while using this medication? Visit your care team for a regular check on your progress. Wear a medical identification bracelet or chain to say you have epilepsy, and carry a card that lists all your medications. This medication may cause serious skin reactions. They can happen weeks to months after starting the medication. Contact your care team right away if you notice fevers or flu-like symptoms with a rash. The rash may be red or purple and then turn into blisters or peeling of the skin. You may also notice a red rash with swelling of the face, lips, or lymph nodes in your neck or under your arms. It is important to take this medication exactly as instructed by your care team. When first starting treatment, your dose may need to be adjusted. It may take weeks or months before your dose is stable. You should contact your care team if your seizures get worse or if you have any new types of seizures.  This medication may affect your coordination, reaction time, or judgment. Do not drive or operate machinery until you know how this medication affects you. Sit up or stand slowly to reduce the risk of dizzy or fainting spells. Drinking alcohol with this medication can increase the risk of these side effects. This medication may cause thoughts of suicide or depression. This  includes sudden changes in mood, behaviors, or thoughts. These changes can happen at any time but are more common in the beginning of treatment or after a change in dose. Call your care team right away if you experience these thoughts or worsening depression. If you become pregnant while using this medication, you may enroll in the [place] [employer] Antiepileptic Drug Pregnancy Registry by calling [phone]. This registry collects information about the safety of antiepileptic medication use during pregnancy. What side effects may I notice from receiving this medication? Side effects that you should report to your care team as soon as possible: Allergic reactions or angioedema--skin rash, itching or hives, swelling of the face, eyes, lips, tongue, arms, or legs, trouble swallowing or breathing Increase in blood pressure in children Infection--fever, chills, cough, or sore throat Loss of balance or coordination Low red blood cell level--unusual weakness or fatigue, dizziness, headache, trouble breathing Mood and behavior changes--anxiety, nervousness, confusion, hallucinations, irritability, hostility, thoughts of suicide or self-harm, worsening mood, feelings of depression Rash, fever, and swollen lymph nodes Redness, swelling, and blistering of the skin over hands and feet Trouble walking Unusual bruising or bleeding Unusual weakness or fatigue Side effects that usually do not require medical attention (report these to your care team if they continue or are bothersome): Dizziness Drowsiness Fatigue Irritability Loss of appetite This list may not describe all possible side effects. Call your doctor for medical advice about side effects. You may report side effects to FDA at 1-800-FDA-1088. Where should I keep my medication? Keep out of reach of children. Store at room temperature between 15 and 30 degrees C (59 and 86 degrees F). Throw away any unused medication after the expiration  date. NOTE: This sheet is a summary. It may not cover all possible information. If you have questions about this medicine, talk to your doctor, pharmacist, or health care provider.  2024 Elsevier/Gold Standard (2023-04-21 00:00:00)  Tramadol  Tablets What is this medication? TRAMADOL  (TRA ma dole) treats severe pain. It is prescribed when other pain medications have not worked or cannot be tolerated. It works by blocking pain signals in the brain. It belongs to a group of medications called opioids. This medicine may be used for other purposes; ask your health care provider or pharmacist if you have questions. COMMON BRAND NAME(S): Ultram  What should I tell my care team before I take this medication? They need to know if you have any of these conditions: Brain tumor Frequently drink alcohol Head injury Kidney disease Liver disease Lung or breathing disease, such as asthma Seizures Stomach or intestine problems Substance use disorder Suicidal thoughts, plans, or attempt by you or a family member Taken an MAOI, such as Marplan, Nardil, or Parnate in the last 14 days An unusual or allergic reaction to tramadol , other medications, foods, dyes, or preservatives Pregnant or trying to get pregnant Breastfeeding How should I use this medication? Take this medication by mouth with a full glass of water . Take it as directed on the prescription label. You can take it with or without food. If it upsets your stomach, take it with food. Do not take it more often  than directed. A special MedGuide will be given to you by the pharmacist with each prescription and refill. Be sure to read this information carefully each time. Talk to your care team about the use of this medication in children. Special care may be needed. Overdosage: If you think you have taken too much of this medicine contact a poison control center or emergency room at once. NOTE: This medicine is only for you. Do not share this  medicine with others. What if I miss a dose? If you miss a dose, take it as soon as you can. If it is almost time for your next dose, take only that dose. Do not take double or extra doses. What may interact with this medication? Do not take this medication with any of the following: Linezolid MAOIs, such as Marplan, Nardil, and Parnate Methylene blue Ozanimod This medication may also interact with the following: Alcohol Antihistamines for allergy, cough, and cold Atropine Certain antibiotics, such as erythromycin, clarithromycin, rifampin Certain antivirals for HIV or hepatitis Certain medications for anxiety or sleep Certain medications for bladder problems, such as oxybutynin, tolterodine Certain medications for depression, such as amitriptyline , bupropion, fluoxetine, paroxetine, sertraline  Certain medications for fungal infections, such as ketoconazole, itraconazole, or posaconazole Certain medications for migraine headache, such as almotriptan, eletriptan, frovatriptan, naratriptan, rizatriptan, sumatriptan, zolmitriptan Certain medications for Parkinson disease, such as benztropine, trihexyphenidyl Certain medications for seizures, such as carbamazepine , phenobarbital, primidone Certain medications for stomach problems, such as dicyclomine , hyoscyamine Certain medications for travel sickness, such as scopolamine Digoxin Diuretics General anesthetics, such as halothane, isoflurane, methoxyflurane, propofol  Ipratropium Medications that relax muscles for surgery Other opioid medications for pain Phenothiazines, such as chlorpromazine, mesoridazine, prochlorperazine , thioridazine Quinidine Warfarin This list may not describe all possible interactions. Give your health care provider a list of all the medicines, herbs, non-prescription drugs, or dietary supplements you use. Also tell them if you smoke, drink alcohol, or use illegal drugs. Some items may interact with your  medicine. What should I watch for while using this medication? Tell your care team if your pain does not go away, if it gets worse, or if you have new or a different type of pain. You may develop tolerance to this medication. Tolerance means that you will need a higher dose of the medication for pain relief. Tolerance is normal and is expected if you take this medication for a long time. Taking this medication with other substances that cause drowsiness, such as alcohol, benzodiazepines, or other opioids can cause serious side effects. Give your care team a list of all medications you use. They will tell you how much medication to take. Do not take more medication than directed. Call emergency services if you have problems breathing or staying awake. Long term use of this medication may cause your brain and body to depend on it. This can happen even when used as directed by your care team. You and your care team will work together to determine how long you will need to take this medication. If your care team wants you to stop this medication, the dose will be slowly lowered over time to reduce the risk of side effects. Naloxone is an emergency medication used for an opioid overdose. An overdose can happen if you take too much of an opioid. It can also happen if an opioid is taken with some other medications or substances such as alcohol. Know the symptoms of an overdose, such as trouble breathing, unusually tired or sleepy, or not being  able to respond or wake up. Make sure to tell caregivers and close contacts where your naloxone is stored. Make sure they know how to use it. After naloxone is given, the person giving it must call emergency services. Naloxone is a temporary treatment. Repeat doses may be needed. This medication may affect your coordination, reaction time, or judgment. Do not drive or operate machinery until you know how this medication affects you. Sit up or stand slowly to reduce the risk of  dizzy or fainting spells. Drinking alcohol with this medication can increase the risk of these side effects. This medication may cause serious skin reactions. They can happen weeks to months after starting the medication. Contact your care team right away if you notice fevers or flu-like symptoms with a rash. The rash may be red or purple and then turn into blisters or peeling of the skin. Or, you might notice a red rash with swelling of the face, lips, or lymph nodes in your neck or under your arms. This medication will cause constipation. If you do not have a bowel movement for 3 days, call your care team. Your mouth may get dry. Chewing sugarless gum or sucking hard candy and drinking plenty of water  may help. Contact your care team if the problem does not go away or is severe. Talk to your care team if you may be pregnant. Prolonged use of this medication during pregnancy can cause temporary withdrawal in a newborn. Talk to your care team before breastfeeding. Changes to your treatment plan may be needed. If you breastfeed while taking this medication, seek medical care right away if you notice the child has slow or noisy breathing, is unusually sleepy or not able to wake up, or is limp. Long-term use of this medication may cause infertility. Talk to your care team if you are concerned about your fertility. What side effects may I notice from receiving this medication? Side effects that you should report to your care team as soon as possible: Allergic reactions--skin rash, itching, hives, swelling of the face, lips, tongue, or throat CNS depression--slow or shallow breathing, shortness of breath, feeling faint, dizziness, confusion, difficulty staying awake Low adrenal gland function--nausea, vomiting, loss of appetite, unusual weakness or fatigue, dizziness Low blood pressure--dizziness, feeling faint or lightheaded, blurry vision Low blood sugar (hypoglycemia)--tremors or shaking, anxiety,  sweating, cold or clammy skin, confusion, dizziness, rapid heartbeat Low sodium level--muscle weakness, fatigue, dizziness, headache, confusion Redness, blistering, peeling, or loosening of the skin, including inside the mouth Seizures Side effects that usually do not require medical attention (report to your care team if they continue or are bothersome): Constipation Dizziness Drowsiness Dry mouth Headache Nausea Vomiting This list may not describe all possible side effects. Call your doctor for medical advice about side effects. You may report side effects to FDA at 1-800-FDA-1088. Where should I keep my medication? Keep this medication out of reach of children and pets. Store it out of sight in a safe place. Do not share it with others. Misuse of this medication is dangerous and against the law. Store at room temperature between 20 and 25 degrees C (68 and 77 degrees F). Get rid of any unused medication after the expiration date. This medication may cause harm and death if it is taken by other adults, children, or pets. It is important to get rid of the medication as soon as you no longer need it or it is expired. To get rid of this medication: Take the medication  to a take-back program. Check with your pharmacy or law enforcement to find a location. Follow the steps given to you by your pharmacy. You may be given a pre-paid mail-back envelope or disposal product to safely get rid of your medication. If other options are not available, check the package insert or medication guide to see if it should be flushed down the toilet or put in your trash at home. If you are not sure, ask your care team. If it is safe to put it in your trash, empty the medication out of the container. Mix it with cat litter, dirt, used coffee grounds, or another unwanted substance. Seal the mixture in a container, such as a plastic bag. Put it in the trash. NOTE: This sheet is a summary. It may not cover all possible  information. If you have questions about this medicine, talk to your doctor, pharmacist, or health care provider.  2025 Elsevier/Gold Standard (2023-04-26 00:00:00)

## 2023-11-09 ENCOUNTER — Telehealth: Payer: Self-pay | Admitting: *Deleted

## 2023-11-09 ENCOUNTER — Other Ambulatory Visit: Payer: Self-pay

## 2023-11-09 MED ORDER — FUROSEMIDE 20 MG PO TABS: ORAL_TABLET | ORAL | 0 refills | Status: DC

## 2023-11-09 NOTE — Telephone Encounter (Signed)
 Received neurosurgery notes from [facility]. Notes placed on Dr Harding Li desk.

## 2023-11-25 LAB — SIGNATERA
SIGNATERA MTM READOUT: 0 MTM/ml
SIGNATERA TEST RESULT: NEGATIVE

## 2023-11-29 ENCOUNTER — Encounter (HOSPITAL_COMMUNITY): Payer: Self-pay

## 2023-12-06 ENCOUNTER — Other Ambulatory Visit: Payer: Self-pay | Admitting: Nurse Practitioner

## 2023-12-06 DIAGNOSIS — C153 Malignant neoplasm of upper third of esophagus: Secondary | ICD-10-CM

## 2023-12-06 NOTE — Progress Notes (Signed)
 Patient Care Team: [name], Apolinar POUR, MD as PCP - General Lonni Slain, MD as PCP - Cardiology (Cardiology) Ozell Higashi (Neurosurgery) Watt Rush, MD as Attending Physician (Urology) Unice Pac, MD as Consulting Physician (Neurosurgery) Ines Onetha NOVAK, MD as Consulting Physician (Neurology) Avram Lupita BRAVO, MD as Consulting Physician (Gastroenterology) Rollin Dover, MD as Consulting Physician (Gastroenterology) Ethyl Lonni BRAVO, MD (Inactive) as Consulting Physician (Otolaryngology) Lanny Callander, MD as Consulting Physician (Hematology) Dewey Rush, MD as Consulting Physician (Radiation Oncology) Dewey Rush, MD as Consulting Physician (Radiation Oncology) Lionell Jon DEL, [facility] (Pharmacist)  Clinic Day:  12/07/2023  Referring physician: Charlett Apolinar POUR, MD  ASSESSMENT & PLAN:   Assessment & Plan: 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 EGD 9/15 and CT neck 02/05/21 showed 6 cm mass in the cervical/upper esophagus with possible local adenopathy. 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 Shyrl felt she was not a surgical candidate.  -she received concurrent chemoRT with carbo/taxol  03/08/21 - 04/14/21.  -given PDL1+, she began immunotherapy with Nivolumab  on 05/21/21, and completed 1 year therapy in December 2023. -last EGD in November 2023 showed benign stenosis, no evidence of recurrence or residual disease. -restaging CT CAP on 07/27/2022 showed no evidence of disease. --12/06/2022 Ct Cap showed no evidence of metastatic disease. Recommended repeat CT Cap in 1 year (11/2023). Has been clinically doing well.  03/2023 -CT of soft tissue neck was negative for evidence of metastases and showed good response to prior treatment. -Swallow test and esophagram were done 05/23/2023.  These showed mild esophageal dysmotility,  but otherwise normal exams. -10/10/2023 - Due to worsening dysphagia, will get PET CT and ctDNA Signatera test to evaluate for metastatic disease or recurrence. Will contact GI, Dr. Avram, for guidance regarding EGD and esophageal dilation. For now, recommend full liquid diet, avoiding solid foods as much as possible. She should carefully chew any solid foods.  10/13/2023 - PET/CT showed no specific areas of abnormal uptake to suggest recurrent or metastatic disease.  Slight thickening along the vocal cords was similar in appearance to previous CTs. ctDNA Signatera was negative. - 12/07/2023 -restaging CT CAP ordered as part of today's visit. Continue cancer surveillance with labs and follow-up in 4 months, sooner if needed.   Trigeminal neuralgia Condition is managed by neurology.  Recently underwent second gamma knife procedure.  States that procedure was approximately 4 to 6 weeks ago.  Took a couple of months to notice a lot of difference the first time she had this procedure.  Will begin to try Keppra  again soon.  She has been reluctant to restart this medication as it has caused her to feel mentally foggy.  Dysphagia Patient with persistent dysphagia.  We did review recent PET/CT which showed no evidence of cancer recurrence for metastatic disease.  She also had Signatera test which was negative for ctDNA.  Her GI provider is extremely cautious about doing esophageal dilation due to multiple prior procedures and risk of bleeding and permanent damage.  Per patient, GI provider states he would proceed if she was insisting on it.  For now, she continues to eat very slowly with very small bites of food.  Avoids going out to dinner due to eating so much lower than others and fear of choking.  Plan Labs reviewed. -CBC unremarkable. -CMP showing stable CKD with BUN 33, creatinine 1.4, and GFR 37. Surveillance CT CAP  ordered as part of today's visit. Labs and follow-up in 3 to 4 months, sooner if  needed.    The patient understands the plans discussed today and is in agreement with them.  She knows to contact our office if she develops concerns prior to her next appointment.  I provided 25 minutes of face-to-face time during this encounter and > 50% was spent counseling as documented under my assessment and plan.    Powell FORBES Lessen, NP  [facility] - A DEPT OF [facility] [address] [place] [address] Dept: [phone] Dept Fax: [phone]   Orders Placed This Encounter  Procedures   CT CHEST ABDOMEN PELVIS WO CONTRAST    Standing Status:   Future    Expected Date:   04/08/2024    Expiration Date:   12/06/2024    Preferred imaging location?:   [facility]    If indicated for the ordered procedure, I authorize the administration of oral contrast media per Radiology protocol:   Yes    Does the patient have a contrast media/X-ray dye allergy?:   No      CHIEF COMPLAINT:  CC: Esophageal cancer  Current Treatment: Surveillance  INTERVAL HISTORY:  [name] is here today for repeat clinical assessment.  The patient was last seen by me on 10/10/2023.  She had PET/CT done on 10/13/2023.  This showed No specific areas of abnormal uptake to suggest recurrent or metastatic disease. There was slight thickening and uptake along the vocal cords superiorly, appearance by CT is similar to previous imaging. ctDNA Signatera testing was negative.  She continues to have significant dysphagia.  She is eating very small bites of food very slowly.  She avoids going out to dinner in public.  She states she eats much slower than other people and is afraid to experience choking sensation in public.  She has also had some flareup of her trigeminal neuralgia.  Recently underwent second get an eye procedure.  May be trying Keppra  again.  Has been hesitant as this caused her mental fogginess in the past.  She is managed by  neurology for trigeminal neuralgia.  She denies chest pain, chest pressure, or shortness of breath. She denies headaches or visual disturbances. She denies abdominal pain, nausea, vomiting, or changes in bowel or bladder habits.  denies fevers or chills. She denies pain. Her appetite is good, however, she feels like she does not eat enough due to slow eating and fear of choking. Her weight has been stable.  I have reviewed the past medical history, past surgical history, social history and family history with the patient and they are unchanged from previous note.  ALLERGIES:  is allergic to other, bactrim [sulfamethoxazole-trimethoprim], hydrocodone , and sulfa antibiotics.  MEDICATIONS:  Current Outpatient Medications  Medication Sig Dispense Refill   acetaminophen  (TYLENOL ) 500 MG tablet Take 500-1,000 mg by mouth every 4 (four) hours as needed for headache, mild pain or moderate pain.     albuterol  (VENTOLIN  HFA) 108 (90 Base) MCG/ACT inhaler INHALE 1 TO 2 PUFFS INTO THE LUNGS EVERY 6 HOURS AS NEEDED FOR WHEEZING OR SHORTNESS OF BREATH 6.7 g 0   Calcium Carb-Cholecalciferol 600-20 MG-MCG TABS Take by mouth.     carvedilol  (COREG ) 25 MG tablet TAKE 1 TABLET(25 MG) BY MOUTH TWICE DAILY WITH A MEAL 60 tablet 2   conjugated estrogens  (PREMARIN ) vaginal cream Place 1 applicator vaginally once a week.  estradiol (ESTRACE) 0.1 MG/GM vaginal cream APPLY 1/2 VAGINALLY AT BEDTIME TWICE WEEKLY     famotidine  (PEPCID ) 20 MG tablet One after supper 30 tablet 11   fluticasone  (FLONASE ) 50 MCG/ACT nasal spray Place 2 sprays into both nostrils 2 (two) times daily. 16 g 6   furosemide  (LASIX ) 20 MG tablet TAKE 1 TABLET(20 MG) BY MOUTH DAILY 90 tablet 0   lamoTRIgine  (LAMICTAL ) 100 MG tablet Take 1.5 tablets (150 mg total) by mouth 2 (two) times daily. 90 tablet 1   lamoTRIgine  (LAMICTAL ) 25 MG tablet Take 1 tablet by mouth twice daily. Take this in addition to your already prescribed 150 mg dose for new total  of 175 mg twice daily. 60 tablet 1   levETIRAcetam  (KEPPRA ) 500 MG tablet Start with one tab daily (500mg )and in 1  weeks, if no side effects, may increase to 2 times daily 60 tablet 6   loperamide  (IMODIUM ) 2 MG capsule 2 mg daily as needed for diarrhea or loose stools. Per tube     Multiple Minerals-Vitamins (CAL-MAG-ZINC -D PO) Take 1 tablet by mouth 2 (two) times daily.     Multiple Vitamin (MULTIVITAMIN WITH MINERALS) TABS tablet Place 1 tablet into feeding tube in the morning.     Nutritional Supplements (NUTREN 1.5) LIQD Place 237 mLs into feeding tube daily.     omeprazole  (PRILOSEC) 40 MG capsule Take 30- 60 min before your first and last meals of the day 180 capsule 1   ondansetron  (ZOFRAN -ODT) 4 MG disintegrating tablet DISSOLVE 1 TO 2 TABLETS(4 TO 8 MG) ON THE TONGUE EVERY 8 HOURS AS NEEDED FOR NAUSEA OR VOMITING 30 tablet 1   Polyethyl Glycol-Propyl Glycol (SYSTANE) 0.4-0.3 % GEL ophthalmic gel Place 1 application into both eyes 2 (two) times daily as needed (dry eyes).     prochlorperazine  (COMPAZINE ) 10 MG tablet TAKE 1 TABLET(10 MG) BY MOUTH EVERY 6 HOURS AS NEEDED FOR NAUSEA OR VOMITING 30 tablet 1   triamcinolone cream (KENALOG) 0.1 % Apply 1 Application topically 2 (two) times daily.     No current facility-administered medications for this visit.    HISTORY OF PRESENT ILLNESS:   Oncology History Overview Note   Cancer Staging  Malignant neoplasm of upper third esophagus (HCC) Staging form: Esophagus - Squamous Cell Carcinoma, AJCC 8th Edition - Clinical stage from 02/12/2021: Stage Unknown (cTX, cN2, cM0) - Signed by Lanny Callander, MD on 03/08/2021 Stage prefix: Initial diagnosis     Malignant neoplasm of upper third esophagus (HCC)  12/21/2020 Imaging   Laryngoscopy Comments:    On fiberoptic laryngoscopy through the right nostril the nasopharynx was clear.  The base of tongue vallecula and epiglottis were normal.    Piriform sinuses were clear bilaterally and vocal cords  were clear with  normal vocal mobility.  No structural abnormalities noted.   01/22/2021 Imaging   DG esophagus IMPRESSION: 1. Luminal narrowing in the high cervical esophagus over a 3 cm segment is highly concerning for esophageal neoplasm. Inflammatory process would be a secondary consideration. Luminal narrowing occurs approximately at the C5-C7 vertebral body level just below the glottis. Patient experienced several episodes of choking related to this luminal narrowing. Recommend expedient upper GI endoscopy for evaluation. 2. Distal thoracic esophagus and GE junction appear normal.   02/04/2021 Procedure   EGD by Dr. Avram impression- One mass-like severe stenosis was found in the upper third of the esophagus. The stenosis was not traversed.   02/05/2021 Imaging   CT soft tissue neck w  contrast IMPRESSION: Mass-like soft tissue thickening and mucosal hyperenhancement of the cervical esophagus and hypopharynx, spanning the C4-T1 levels, likely reflecting an esophageal malignancy. This measures up to 2.4 x 3.4 cm in transaxial dimensions, and 6.3 cm in craniocaudal dimension. Associated severe effacement of the esophageal lumen.   Centrally necrotic right paratracheal lymph node beneath the level of the mass, measuring 1.3 x 0.9 cm, and likely reflecting a site of nodal metastatic disease.   Additional lymph nodes along the posterior and inferior aspect of the right thyroid  lobe (along the right aspect of the esophageal mass), which measure subcentimeter but are asymmetrically prominent and highly suspicious for additional sites of nodal metastatic disease.   02/12/2021 Pathology Results   FINAL MICROSCOPIC DIAGNOSIS:  A. ESOPHAGUS, UPPER CERVICAL #1, BIOPSY:  - Squamous cell carcinoma.  B. ESOPHAGUS, UPPER CERVICAL #2, BIOPSY:  - Squamous cell carcinoma.    02/12/2021 Cancer Staging   Staging form: Esophagus - Squamous Cell Carcinoma, AJCC 8th Edition - Clinical stage from 02/12/2021:  Stage Unknown (cTX, cN2, cM0) - Signed by Lanny Callander, MD on 03/08/2021 Stage prefix: Initial diagnosis   02/18/2021 Initial Diagnosis   Malignant neoplasm of upper third esophagus (HCC)   02/23/2021 PET scan   IMPRESSION: Markedly hypermetabolic focal masslike thickening of the cervical esophagus, compatible with primary esophageal malignancy.   Markedly hypermetabolic right upper paratracheal lymph node, compatible with metastatic disease.   Small right upper paratracheal and right level IIA lymph nodes with mild hypermetabolic activity, concerning for additional sites of nodal metastatic disease.   Aortic Atherosclerosis (ICD10-I70.0).   03/09/2021 - 04/13/2021 Chemotherapy   Patient is on Treatment Plan : ESOPHAGUS Carboplatin /PACLitaxel  weekly x 6 weeks with XRT       05/21/2021 - 01/13/2022 Chemotherapy   Patient is on Treatment Plan : GASTROESOPHAGEAL Nivolumab  q14d x 8 cycles / Nivolumab  q28d     05/21/2021 - 05/05/2022 Chemotherapy   Patient is on Treatment Plan : GASTROESOPHAGEAL Nivolumab  (240) q14d x 8 cycles / Nivolumab  (480) q28d     07/27/2022 Imaging    IMPRESSION: 1. Stable chest CT. No evidence of metastatic disease or acute findings. 2.  Aortic Atherosclerosis (ICD10-I70.0).   12/06/2022 Imaging   CT chest, abdomen, and pelvis with contrast   IMPRESSION: 1. No acute process or evidence of metastatic disease within the chest, abdomen, or pelvis. 2. Trace pelvic fluid, either new or increased since 04/06/2022. Nonspecific. 3. Possible bladder wall thickening and pericystic edema. Correlate with symptoms of cystitis. 4. Incidental findings, including: Coronary artery atherosclerosis. Aortic Atherosclerosis (ICD10-I70.0).       REVIEW OF SYSTEMS:   Constitutional: Denies fevers, chills or abnormal weight loss. Eyes: Denies blurriness of vision Ears, nose, mouth, throat, and face: Denies mucositis or sore throat Respiratory: Denies cough, dyspnea or  wheezes Cardiovascular: Denies palpitation, chest discomfort or lower extremity swelling Gastrointestinal:  Denies nausea, heartburn or change in bowel habits.  Persistent and severe dysphagia. Skin: Denies abnormal skin rashes Lymphatics: Denies new lymphadenopathy or easy bruising Neurological:Denies numbness, tingling or new weaknesses Behavioral/Psych: Mood is stable, no new changes  All other systems were reviewed with the patient and are negative.   VITALS:   Today's Vitals   12/07/23 0900 12/07/23 0935  BP:  118/72  Pulse:  (!) 58  Resp:  17  Temp:  98.3 F (36.8 C)  SpO2:  99%  Weight:  128 lb 1.6 oz (58.1 kg)  PainSc: 0-No pain    Body mass index is 20.68 kg/m.  Wt Readings from Last 3 Encounters:  12/07/23 128 lb 1.6 oz (58.1 kg)  11/06/23 127 lb 6.4 oz (57.8 kg)  10/10/23 130 lb 1.6 oz (59 kg)    Body mass index is 20.68 kg/m.  Performance status (ECOG): 1 - Symptomatic but completely ambulatory  PHYSICAL EXAM:   GENERAL:alert, no distress and comfortable SKIN: skin color, texture, turgor are normal, no rashes or significant lesions EYES: normal, Conjunctiva are pink and non-injected, sclera clear OROPHARYNX:no exudate, no erythema and lips, buccal mucosa, and tongue normal  NECK: supple, thyroid  normal size, non-tender, without nodularity LYMPH:  no palpable lymphadenopathy in the cervical, axillary or inguinal LUNGS: clear to auscultation and percussion with normal breathing effort HEART: regular rate & rhythm and no murmurs and no lower extremity edema ABDOMEN:abdomen soft, non-tender and normal bowel sounds Musculoskeletal:no cyanosis of digits and no clubbing  NEURO: alert & oriented x 3 with fluent speech, no focal motor/sensory deficits  LABORATORY DATA:  I have reviewed the data as listed    Component Value Date/Time   NA 142 12/07/2023 0904   NA 136 03/24/2021 1508   K 4.6 12/07/2023 0904   CL 101 12/07/2023 0904   CO2 38 (H) 12/07/2023  0904   GLUCOSE 91 12/07/2023 0904   GLUCOSE 97 03/17/2006 0820   BUN 33 (H) 12/07/2023 0904   BUN 26 03/24/2021 1508   CREATININE 1.40 (H) 12/07/2023 0904   CALCIUM 10.1 12/07/2023 0904   PROT 6.8 12/07/2023 0904   PROT 6.2 03/24/2021 1508   ALBUMIN 4.1 12/07/2023 0904   ALBUMIN 4.2 03/24/2021 1508   AST 19 12/07/2023 0904   ALT 13 12/07/2023 0904   ALKPHOS 74 12/07/2023 0904   BILITOT 0.6 12/07/2023 0904   GFRNONAA 37 (L) 12/07/2023 0904   GFRAA 62 07/08/2020 1653     Lab Results  Component Value Date   WBC 4.6 12/07/2023   NEUTROABS 3.2 12/07/2023   HGB 13.4 12/07/2023   HCT 40.2 12/07/2023   MCV 89.9 12/07/2023   PLT 187 12/07/2023

## 2023-12-06 NOTE — Assessment & Plan Note (Addendum)
 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 EGD 9/15 and CT neck 02/05/21 showed 6 cm mass in the cervical/upper esophagus with possible local adenopathy. 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 Shyrl felt she was not a surgical candidate.  -she received concurrent chemoRT with carbo/taxol  03/08/21 - 04/14/21.  -given PDL1+, she began immunotherapy with Nivolumab  on 05/21/21, and completed 1 year therapy in December 2023. -last EGD in November 2023 showed benign stenosis, no evidence of recurrence or residual disease. -restaging CT CAP on 07/27/2022 showed no evidence of disease. --12/06/2022 Ct Cap showed no evidence of metastatic disease. Recommended repeat CT Cap in 1 year (11/2023). Has been clinically doing well.  03/2023 -CT of soft tissue neck was negative for evidence of metastases and showed good response to prior treatment. -Swallow test and esophagram were done 05/23/2023.  These showed mild esophageal dysmotility, but otherwise normal exams. -10/10/2023 - Due to worsening dysphagia, will get PET CT and ctDNA Signatera test to evaluate for metastatic disease or recurrence. Will contact GI, Dr. Avram, for guidance regarding EGD and esophageal dilation. For now, recommend full liquid diet, avoiding solid foods as much as possible. She should carefully chew any solid foods.  10/13/2023 - PET/CT showed no specific areas of abnormal uptake to suggest recurrent or metastatic disease.  Slight thickening along the vocal cords was similar in appearance to previous CTs. ctDNA Signatera was negative. - 12/07/2023 -restaging CT CAP ordered as part of today's visit. Continue cancer surveillance with labs and follow-up in 4 months, sooner if needed.

## 2023-12-07 ENCOUNTER — Inpatient Hospital Stay (HOSPITAL_BASED_OUTPATIENT_CLINIC_OR_DEPARTMENT_OTHER): Admitting: Nurse Practitioner

## 2023-12-07 ENCOUNTER — Inpatient Hospital Stay: Attending: Nurse Practitioner

## 2023-12-07 VITALS — BP 118/72 | HR 58 | Temp 98.3°F | Resp 17 | Wt 128.1 lb

## 2023-12-07 DIAGNOSIS — Z9221 Personal history of antineoplastic chemotherapy: Secondary | ICD-10-CM | POA: Insufficient documentation

## 2023-12-07 DIAGNOSIS — C153 Malignant neoplasm of upper third of esophagus: Secondary | ICD-10-CM | POA: Diagnosis not present

## 2023-12-07 DIAGNOSIS — N189 Chronic kidney disease, unspecified: Secondary | ICD-10-CM | POA: Insufficient documentation

## 2023-12-07 DIAGNOSIS — R1310 Dysphagia, unspecified: Secondary | ICD-10-CM | POA: Insufficient documentation

## 2023-12-07 DIAGNOSIS — Z923 Personal history of irradiation: Secondary | ICD-10-CM | POA: Diagnosis not present

## 2023-12-07 DIAGNOSIS — G500 Trigeminal neuralgia: Secondary | ICD-10-CM | POA: Diagnosis not present

## 2023-12-07 DIAGNOSIS — Z8501 Personal history of malignant neoplasm of esophagus: Secondary | ICD-10-CM | POA: Diagnosis present

## 2023-12-07 LAB — CMP (CANCER CENTER ONLY)
ALT: 13 U/L (ref 0–44)
AST: 19 U/L (ref 15–41)
Albumin: 4.1 g/dL (ref 3.5–5.0)
Alkaline Phosphatase: 74 U/L (ref 38–126)
Anion gap: 3 — ABNORMAL LOW (ref 5–15)
BUN: 33 mg/dL — ABNORMAL HIGH (ref 8–23)
CO2: 38 mmol/L — ABNORMAL HIGH (ref 22–32)
Calcium: 10.1 mg/dL (ref 8.9–10.3)
Chloride: 101 mmol/L (ref 98–111)
Creatinine: 1.4 mg/dL — ABNORMAL HIGH (ref 0.44–1.00)
GFR, Estimated: 37 mL/min — ABNORMAL LOW (ref >60)
Glucose, Bld: 91 mg/dL (ref 70–99)
Potassium: 4.6 mmol/L (ref 3.5–5.1)
Sodium: 142 mmol/L (ref 135–145)
Total Bilirubin: 0.6 mg/dL (ref 0.0–1.2)
Total Protein: 6.8 g/dL (ref 6.5–8.1)

## 2023-12-07 LAB — CBC WITH DIFFERENTIAL (CANCER CENTER ONLY)
Abs Immature Granulocytes: 0.02 K/uL (ref 0.00–0.07)
Basophils Absolute: 0.1 K/uL (ref 0.0–0.1)
Basophils Relative: 1 %
Eosinophils Absolute: 0.2 K/uL (ref 0.0–0.5)
Eosinophils Relative: 5 %
HCT: 40.2 % (ref 36.0–46.0)
Hemoglobin: 13.4 g/dL (ref 12.0–15.0)
Immature Granulocytes: 0 %
Lymphocytes Relative: 16 %
Lymphs Abs: 0.7 K/uL (ref 0.7–4.0)
MCH: 30 pg (ref 26.0–34.0)
MCHC: 33.3 g/dL (ref 30.0–36.0)
MCV: 89.9 fL (ref 80.0–100.0)
Monocytes Absolute: 0.4 K/uL (ref 0.1–1.0)
Monocytes Relative: 9 %
Neutro Abs: 3.2 K/uL (ref 1.7–7.7)
Neutrophils Relative %: 69 %
Platelet Count: 187 K/uL (ref 150–400)
RBC: 4.47 MIL/uL (ref 3.87–5.11)
RDW: 12.4 % (ref 11.5–15.5)
WBC Count: 4.6 K/uL (ref 4.0–10.5)
nRBC: 0 % (ref 0.0–0.2)

## 2023-12-10 ENCOUNTER — Encounter: Payer: Self-pay | Admitting: Nurse Practitioner

## 2023-12-11 ENCOUNTER — Telehealth: Payer: Self-pay | Admitting: Nurse Practitioner

## 2023-12-11 NOTE — Telephone Encounter (Signed)
 Scheduled appointments per 7/17 los. Talked with the patient and she is aware of the made appointments.

## 2024-01-04 ENCOUNTER — Other Ambulatory Visit: Payer: Self-pay | Admitting: Adult Health

## 2024-01-04 ENCOUNTER — Other Ambulatory Visit: Payer: Self-pay | Admitting: Neurology

## 2024-01-08 MED ORDER — LAMOTRIGINE 100 MG PO TABS: 150.0000 mg | ORAL_TABLET | Freq: Two times a day (BID) | ORAL | 1 refills | Status: DC

## 2024-01-10 ENCOUNTER — Other Ambulatory Visit: Payer: Self-pay | Admitting: Internal Medicine

## 2024-01-11 ENCOUNTER — Encounter: Payer: Self-pay | Admitting: Adult Health

## 2024-01-18 DIAGNOSIS — Z0279 Encounter for issue of other medical certificate: Secondary | ICD-10-CM

## 2024-01-19 ENCOUNTER — Other Ambulatory Visit: Payer: Self-pay

## 2024-01-24 ENCOUNTER — Encounter: Payer: Self-pay | Admitting: Internal Medicine

## 2024-01-24 ENCOUNTER — Telehealth: Payer: Self-pay

## 2024-01-24 NOTE — Telephone Encounter (Signed)
 Last office visit with Dr. [name]:01/17/2023  Spoke to pt to follow up on drop-off form regarding to Medical clearance to participate in [employer]. It has pt's signature with date from October of 2024.   Ask pt to clarify it. Pt confirms and states she needs Dr. Lynne signature on the form and would like for it to be mail out to her addresss.  Form placed in provider's red folder.

## 2024-01-25 NOTE — Telephone Encounter (Signed)
 Form was dropped off to be mail out.

## 2024-01-25 NOTE — Telephone Encounter (Signed)
Signed - ok

## 2024-02-10 ENCOUNTER — Other Ambulatory Visit: Payer: Self-pay | Admitting: Neurology

## 2024-02-10 ENCOUNTER — Other Ambulatory Visit: Payer: Self-pay | Admitting: Internal Medicine

## 2024-02-15 ENCOUNTER — Other Ambulatory Visit: Payer: Self-pay | Admitting: Internal Medicine

## 2024-02-21 ENCOUNTER — Other Ambulatory Visit: Payer: Self-pay | Admitting: Internal Medicine

## 2024-02-21 DIAGNOSIS — R058 Other specified cough: Secondary | ICD-10-CM

## 2024-02-26 ENCOUNTER — Telehealth: Payer: Self-pay | Admitting: Neurology

## 2024-02-26 NOTE — Telephone Encounter (Signed)
 LVM and sent mychart msg informing pt of need to reschedule 03/14/24 appt - MD departure

## 2024-02-27 ENCOUNTER — Other Ambulatory Visit: Payer: Self-pay | Admitting: Internal Medicine

## 2024-02-27 DIAGNOSIS — R058 Other specified cough: Secondary | ICD-10-CM

## 2024-03-05 ENCOUNTER — Encounter: Payer: Self-pay | Admitting: Internal Medicine

## 2024-03-05 DIAGNOSIS — R058 Other specified cough: Secondary | ICD-10-CM

## 2024-03-06 MED ORDER — OMEPRAZOLE 40 MG PO CPDR: DELAYED_RELEASE_CAPSULE | ORAL | 3 refills | Status: AC

## 2024-03-11 ENCOUNTER — Other Ambulatory Visit: Payer: Self-pay | Admitting: Internal Medicine

## 2024-03-14 ENCOUNTER — Ambulatory Visit: Admitting: Neurology

## 2024-04-08 ENCOUNTER — Ambulatory Visit (HOSPITAL_COMMUNITY)
Admission: RE | Admit: 2024-04-08 | Discharge: 2024-04-08 | Disposition: A | Discharge status: Home / Self Care | Source: Ambulatory Visit | Attending: Nurse Practitioner | Admitting: Nurse Practitioner

## 2024-04-08 DIAGNOSIS — C153 Malignant neoplasm of upper third of esophagus: Secondary | ICD-10-CM | POA: Diagnosis present

## 2024-04-11 ENCOUNTER — Telehealth: Payer: Self-pay | Admitting: Internal Medicine

## 2024-04-11 NOTE — Telephone Encounter (Signed)
Please refill x 90 days 

## 2024-04-11 NOTE — Telephone Encounter (Unsigned)
 Copied from CRM [id number]. Topic: Clinical - Medication Refill >> Apr 11, 2024  4:09 PM Alfonso HERO wrote: Medication:  carvedilol  (COREG ) 25 MG tablet   Has the patient contacted their pharmacy? Yes (Agent: If no, request that the patient contact the pharmacy for the refill. If patient does not wish to contact the pharmacy document the reason why and proceed with request.) (Agent: If yes, when and what did the pharmacy advise?)  This is the patient's preferred pharmacy:  [facility] [address], [place] - [address] [place] [address] Phone: [phone] Fax: [phone]  Is this the correct pharmacy for this prescription? Yes If no, delete pharmacy and type the correct one.   Has the prescription been filled recently? Yes  Is the patient out of the medication? Yes  Has the patient been seen for an appointment in the last year OR does the patient have an upcoming appointment? Yes  Can we respond through MyChart? Yes  Agent: Please be advised that Rx refills may take up to 3 business days. We ask that you follow-up with your pharmacy.

## 2024-04-12 ENCOUNTER — Other Ambulatory Visit: Payer: Self-pay

## 2024-04-14 ENCOUNTER — Other Ambulatory Visit: Payer: Self-pay | Admitting: Nurse Practitioner

## 2024-04-14 DIAGNOSIS — C153 Malignant neoplasm of upper third of esophagus: Secondary | ICD-10-CM

## 2024-04-14 NOTE — Progress Notes (Unsigned)
 Patient Care Team: [name], Apolinar POUR, MD as PCP - General Lonni Slain, MD as PCP - Cardiology (Cardiology) Ozell Higashi (Neurosurgery) Watt Rush, MD as Attending Physician (Urology) Unice Pac, MD as Consulting Physician (Neurosurgery) Ines Onetha NOVAK, MD as Consulting Physician (Neurology) Avram Lupita BRAVO, MD as Consulting Physician (Gastroenterology) Rollin Dover, MD as Consulting Physician (Gastroenterology) Ethyl Lonni BRAVO, MD (Inactive) as Consulting Physician (Otolaryngology) Lanny Callander, MD as Consulting Physician (Hematology) Dewey Rush, MD as Consulting Physician (Radiation Oncology) Dewey Rush, MD as Consulting Physician (Radiation Oncology) Lionell Jon DEL, [facility] (Pharmacist)  Clinic Day:  04/15/2024  Referring physician: Charlett Apolinar POUR, MD  ASSESSMENT & PLAN:   Assessment & Plan: 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 EGD 9/15 and CT neck 02/05/21 showed 6 cm mass in the cervical/upper esophagus with possible local adenopathy. 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 Shyrl felt she was not a surgical candidate.  -she received concurrent chemoRT with carbo/taxol  03/08/21 - 04/14/21.  -given PDL1+, she began immunotherapy with Nivolumab  on 05/21/21, and completed 1 year therapy in December 2023. -last EGD in November 2023 showed benign stenosis, no evidence of recurrence or residual disease. -restaging CT CAP on 07/27/2022 showed no evidence of disease. --12/06/2022 Ct Cap showed no evidence of metastatic disease. Recommended repeat CT Cap in 1 year (11/2023). Has been clinically doing well.  03/2023 -CT of soft tissue neck was negative for evidence of metastases and showed good response to prior treatment. -Swallow test and esophagram were done 05/23/2023.  These showed mild esophageal  dysmotility, but otherwise normal exams. -10/10/2023 - Due to worsening dysphagia, will get PET CT and ctDNA Signatera test to evaluate for metastatic disease or recurrence. Will contact GI, Dr. Avram, for guidance regarding EGD and esophageal dilation. For now, recommend full liquid diet, avoiding solid foods as much as possible. She should carefully chew any solid foods.  10/13/2023 - PET/CT showed no specific areas of abnormal uptake to suggest recurrent or metastatic disease.  Slight thickening along the vocal cords was similar in appearance to previous CTs. ctDNA Signatera was negative. - 12/07/2023 -restaging CT CAP ordered as part of today's visit. -04/08/2024 - restaging CT CAP without contrast showing no evidence of recurrence or metastatic disease in chest, abdomen, or pelvis.  --continue cancer surveillance.  -plan for labs and follow up in 6 months, sooner if needed.    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 Has trouble getting choked when eating. Has to be very careful eating more solid foods to avoid choking and coughing. She states that this does interrupt her desired activities. Is embarrassed to have choking episode in public, so does not go out often with her family. This is not getting more severe.  Her GI, Dr.Gessner, is reluctant to repeat endoscopy and esophageal dilation due to multiple previous procedures along with radiation to the esophagus.  Is concerned that another procedure would do more harm than good.  Patient understands this and if she can contact him if symptoms become severe.  Decreased appetite Patient reports overall minimal appetite.  She does consume boost nutritional supplements to help keep calorie and protein counts up.  Weight is stable.  Chronic kidney disease Monitored by primary care.  eGFR stable at 38.  BUN is 29 and creatinine at 1.38.  Dry mouth and lips Patient states for last 1 year, she has noticed extremely  dry lips with small lesions inside  the mouth which are not going away.  They are not painful.  States she applies Aquaphor to her lips multiple times daily.  This will always help for a short time, but otherwise needs to reapply. She did recently have routine dental care performed.  States lesions were not mentioned during exam.  Recommend she initially discussed with her dentist. They may require additional evaluation.  Plan Labs reviewed.  -CBC unremarkable.  -stable CKD with remainder of CMP unremarkable.  Reviewed CT CAP from 04/08/2024 showing no evidence of recurrence or metastatic disease. Plan to repeat scan in 1 year.  Recommend she discuss dry skin on her lips and, persistent lesions inside her mouth.  She is agreeable to this plan. Plan for labs and follow-up in 6 months, sooner if needed.   The patient understands the plans discussed today and is in agreement with them.  She knows to contact our office if she develops concerns prior to her next appointment.  I provided 25 minutes of face-to-face time during this encounter and > 50% was spent counseling as documented under my assessment and plan.    Powell FORBES Lessen, NP  [facility] - A DEPT OF [facility] [address] [place] [address] Dept: [phone] Dept Fax: [phone]   No orders of the defined types were placed in this encounter.     CHIEF COMPLAINT:  CC: Esophageal cancer  Current Treatment: Surveillance  INTERVAL HISTORY:  [name] is here today for repeat clinical assessment.  She last saw me on 12/07/2023.  She had CT CAP on 04/08/2024.  This showed no evidence of metastases or evidence of recurrence in the chest, abdomen, or pelvis.  She has persistent dysphagia which is at baseline.  Has spoken to her GI provider who is reluctant to perform endoscopy with or without esophageal dilation.  States concern due to multiple previous procedures and radiation to esophagus.  She also  reports stable, no appetite.  Weight remains stable.  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  She has unsure when this started, but has been going on for some time.  She denies chest pain, chest pressure, or shortness of breath. She denies headaches or visual disturbances.  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  She denies abdominal pain, nausea, vomiting, or changes in bowel or bladder habits.  She denies fevers or chills. She denies pain.   I have reviewed the past medical history, past surgical history, social history and family history with the patient and they are unchanged from previous note.  ALLERGIES:  is allergic to other, bactrim [sulfamethoxazole-trimethoprim], hydrocodone , and sulfa antibiotics.  MEDICATIONS:  Current Outpatient Medications  Medication Sig Dispense Refill   acetaminophen  (TYLENOL ) 500 MG tablet Take 500-1,000 mg by mouth every 4 (four) hours as needed for headache, mild pain or moderate pain.     albuterol  (VENTOLIN  HFA) 108 (90 Base) MCG/ACT inhaler INHALE 1 TO 2 PUFFS INTO THE LUNGS EVERY 6 HOURS AS NEEDED FOR WHEEZING OR SHORTNESS OF BREATH 6.7 g 0   Calcium Carb-Cholecalciferol 600-20 MG-MCG TABS Take by mouth.     carvedilol  (COREG ) 25 MG tablet TAKE 1 TABLET BY MOUTH TWICE DAILY WITH A MEAL --PLEASE  SCHEDULE  AN  APPT  FOR  FURTHER  REFILLS 60 tablet 0   estradiol (ESTRACE) 0.1 MG/GM vaginal cream APPLY 1/2 VAGINALLY AT BEDTIME  TWICE WEEKLY     famotidine  (PEPCID ) 20 MG tablet One after supper 30 tablet 11   fluticasone  (FLONASE ) 50 MCG/ACT nasal spray Place 2 sprays into both nostrils 2 (two) times daily. 16 g 6   furosemide  (LASIX ) 20 MG tablet Take 1 tablet by mouth once daily 90 tablet 0   loperamide  (IMODIUM ) 2 MG capsule 2 mg daily as needed for diarrhea or loose stools. Per tube     Multiple Minerals-Vitamins (CAL-MAG-ZINC -D PO) Take 1 tablet by mouth 2 (two) times daily.     Multiple Vitamin (MULTIVITAMIN WITH MINERALS)  TABS tablet Place 1 tablet into feeding tube in the morning.     Nutritional Supplements (NUTREN 1.5) LIQD Place 237 mLs into feeding tube daily.     omeprazole  (PRILOSEC) 40 MG capsule Take 30- 60 min before your first and last meals of the day 90 capsule 3   ondansetron  (ZOFRAN -ODT) 4 MG disintegrating tablet DISSOLVE 1 TO 2 TABLETS(4 TO 8 MG) ON THE TONGUE EVERY 8 HOURS AS NEEDED FOR NAUSEA OR VOMITING 30 tablet 1   Polyethyl Glycol-Propyl Glycol (SYSTANE) 0.4-0.3 % GEL ophthalmic gel Place 1 application into both eyes 2 (two) times daily as needed (dry eyes).     prochlorperazine  (COMPAZINE ) 10 MG tablet TAKE 1 TABLET(10 MG) BY MOUTH EVERY 6 HOURS AS NEEDED FOR NAUSEA OR VOMITING 30 tablet 1   lamoTRIgine  (LAMICTAL ) 25 MG tablet Take 3 tablets (75 mg total) by mouth 2 (two) times daily.     No current facility-administered medications for this visit.    HISTORY OF PRESENT ILLNESS:   Oncology History Overview Note   Cancer Staging  Malignant neoplasm of upper third esophagus (HCC) Staging form: Esophagus - Squamous Cell Carcinoma, AJCC 8th Edition - Clinical stage from 02/12/2021: Stage Unknown (cTX, cN2, cM0) - Signed by Lanny Callander, MD on 03/08/2021 Stage prefix: Initial diagnosis     Malignant neoplasm of upper third esophagus (HCC)  12/21/2020 Imaging   Laryngoscopy Comments:    On fiberoptic laryngoscopy through the right nostril the nasopharynx was clear.  The base of tongue vallecula and epiglottis were normal.    Piriform sinuses were clear bilaterally and vocal cords were clear with  normal vocal mobility.  No structural abnormalities noted.   01/22/2021 Imaging   DG esophagus IMPRESSION: 1. Luminal narrowing in the high cervical esophagus over a 3 cm segment is highly concerning for esophageal neoplasm. Inflammatory process would be a secondary consideration. Luminal narrowing occurs approximately at the C5-C7 vertebral body level just below the glottis. Patient experienced  several episodes of choking related to this luminal narrowing. Recommend expedient upper GI endoscopy for evaluation. 2. Distal thoracic esophagus and GE junction appear normal.   02/04/2021 Procedure   EGD by Dr. Avram impression- One mass-like severe stenosis was found in the upper third of the esophagus. The stenosis was not traversed.   02/05/2021 Imaging   CT soft tissue neck w contrast IMPRESSION: Mass-like soft tissue thickening and mucosal hyperenhancement of the cervical esophagus and hypopharynx, spanning the C4-T1 levels, likely reflecting an esophageal malignancy. This measures up to 2.4 x 3.4 cm in transaxial dimensions, and 6.3 cm in craniocaudal dimension. Associated severe effacement of the esophageal lumen.   Centrally necrotic right paratracheal lymph node beneath the level of the mass, measuring 1.3 x 0.9 cm, and likely reflecting a site of nodal metastatic disease.   Additional lymph nodes along the posterior and inferior aspect of the right thyroid  lobe (along the right  aspect of the esophageal mass), which measure subcentimeter but are asymmetrically prominent and highly suspicious for additional sites of nodal metastatic disease.   02/12/2021 Pathology Results   FINAL MICROSCOPIC DIAGNOSIS:  A. ESOPHAGUS, UPPER CERVICAL #1, BIOPSY:  - Squamous cell carcinoma.  B. ESOPHAGUS, UPPER CERVICAL #2, BIOPSY:  - Squamous cell carcinoma.    02/12/2021 Cancer Staging   Staging form: Esophagus - Squamous Cell Carcinoma, AJCC 8th Edition - Clinical stage from 02/12/2021: Stage Unknown (cTX, cN2, cM0) - Signed by Lanny Callander, MD on 03/08/2021 Stage prefix: Initial diagnosis   02/18/2021 Initial Diagnosis   Malignant neoplasm of upper third esophagus (HCC)   02/23/2021 PET scan   IMPRESSION: Markedly hypermetabolic focal masslike thickening of the cervical esophagus, compatible with primary esophageal malignancy.   Markedly hypermetabolic right upper paratracheal lymph  node, compatible with metastatic disease.   Small right upper paratracheal and right level IIA lymph nodes with mild hypermetabolic activity, concerning for additional sites of nodal metastatic disease.   Aortic Atherosclerosis (ICD10-I70.0).   03/09/2021 - 04/13/2021 Chemotherapy   Patient is on Treatment Plan : ESOPHAGUS Carboplatin /PACLitaxel  weekly x 6 weeks with XRT       05/21/2021 - 01/13/2022 Chemotherapy   Patient is on Treatment Plan : GASTROESOPHAGEAL Nivolumab  q14d x 8 cycles / Nivolumab  q28d     05/21/2021 - 05/05/2022 Chemotherapy   Patient is on Treatment Plan : GASTROESOPHAGEAL Nivolumab  (240) q14d x 8 cycles / Nivolumab  (480) q28d     07/27/2022 Imaging    IMPRESSION: 1. Stable chest CT. No evidence of metastatic disease or acute findings. 2.  Aortic Atherosclerosis (ICD10-I70.0).   12/06/2022 Imaging   CT chest, abdomen, and pelvis with contrast   IMPRESSION: 1. No acute process or evidence of metastatic disease within the chest, abdomen, or pelvis. 2. Trace pelvic fluid, either new or increased since 04/06/2022. Nonspecific. 3. Possible bladder wall thickening and pericystic edema. Correlate with symptoms of cystitis. 4. Incidental findings, including: Coronary artery atherosclerosis. Aortic Atherosclerosis (ICD10-I70.0).   04/08/2024 Imaging   CT Chest, Abdomen, and Pelvis without contrast  IMPRESSION: 1. No acute process or evidence of metastatic disease in the chest, abdomen, or pelvis. 2. Incidental findings, including: Aortic atherosclerosis (icd10-i70.0). Coronary artery atherosclerosis. Tiny similar pulmonary nodules, considered benign .       REVIEW OF SYSTEMS:   Constitutional: Denies fevers, chills or abnormal weight loss Eyes: Denies blurriness of vision Ears, nose, mouth, throat, and face: Denies mucositis or sore throat. Constant dry mouth and lips. Has noted small lesions inside her mouth which do not go away. Has seen her dentist for routine  care recently. Nothing was mentioned about noted lesions. States she will contact her dentist to discuss this further.  Respiratory: Denies cough, dyspnea or wheezes Cardiovascular: Denies palpitation, chest discomfort or lower extremity swelling Gastrointestinal:  Denies nausea, heartburn or change in bowel habits. Stable dysphagia.  Skin: Denies abnormal skin rashes Lymphatics: Denies new lymphadenopathy or easy bruising Neurological:Denies numbness, tingling or new weaknesses Behavioral/Psych: Mood is stable, no new changes  All other systems were reviewed with the patient and are negative.   VITALS:   Today's Vitals   04/15/24 1017 04/15/24 1136  BP: 138/76   Pulse: (!) 59   Resp: 17   Temp: 97.8 F (36.6 C)   SpO2: 97%   Weight: 128 lb 9.6 oz (58.3 kg)   PainSc:  0-No pain   Body mass index is 20.76 kg/m.   Wt Readings from Last 3 Encounters:  04/15/24 128 lb 9.6 oz (58.3 kg)  12/07/23 128 lb 1.6 oz (58.1 kg)  11/06/23 127 lb 6.4 oz (57.8 kg)    Body mass index is 20.76 kg/m.  Performance status (ECOG): 1 - Symptomatic but completely ambulatory  PHYSICAL EXAM:   GENERAL:alert, no distress and comfortable SKIN: skin color, texture, turgor are normal, no rashes or significant lesions EYES: normal, Conjunctiva are pink and non-injected, sclera clear OROPHARYNX:no exudate, no erythema and lips, buccal mucosa, and tongue normal  NECK: supple, thyroid  normal size, non-tender, without nodularity LYMPH:  no palpable lymphadenopathy in the cervical, axillary or inguinal LUNGS: clear to auscultation and percussion with normal breathing effort HEART: regular rate & rhythm and no murmurs and no lower extremity edema ABDOMEN:abdomen soft, non-tender and normal bowel sounds Musculoskeletal:no cyanosis of digits and no clubbing  NEURO: alert & oriented x 3 with fluent speech, no focal motor/sensory deficits  LABORATORY DATA:  I have reviewed the data as listed    Component  Value Date/Time   NA 141 04/15/2024 0949   NA 136 03/24/2021 1508   K 4.2 04/15/2024 0949   CL 102 04/15/2024 0949   CO2 30 04/15/2024 0949   GLUCOSE 132 (H) 04/15/2024 0949   GLUCOSE 97 03/17/2006 0820   BUN 29 (H) 04/15/2024 0949   BUN 26 03/24/2021 1508   CREATININE 1.38 (H) 04/15/2024 0949   CALCIUM 9.8 04/15/2024 0949   PROT 6.5 04/15/2024 0949   PROT 6.2 03/24/2021 1508   ALBUMIN 4.1 04/15/2024 0949   ALBUMIN 4.2 03/24/2021 1508   AST 26 04/15/2024 0949   ALT 14 04/15/2024 0949   ALKPHOS 72 04/15/2024 0949   BILITOT 0.6 04/15/2024 0949   GFRNONAA 38 (L) 04/15/2024 0949   GFRAA 62 07/08/2020 1653    Lab Results  Component Value Date   WBC 4.2 04/15/2024   NEUTROABS 2.8 04/15/2024   HGB 13.3 04/15/2024   HCT 41.0 04/15/2024   MCV 90.9 04/15/2024   PLT 158 04/15/2024    RADIOGRAPHIC STUDIES: CT CHEST ABDOMEN PELVIS WO CONTRAST Result Date: 04/12/2024 EXAM: CT CHEST, ABDOMEN AND PELVIS WITHOUT CONTRAST 04/08/2024 10:30:15 AM TECHNIQUE: CT of the chest, abdomen and pelvis was performed without the administration of intravenous contrast. Multiplanar reformatted images are provided for review. Automated exposure control, iterative reconstruction, and/or weight based adjustment of the mA/kV was utilized to reduce the radiation dose to as low as reasonably achievable. COMPARISON: PET of 10/13/2023. Prior CTs of 12/06/2022. CLINICAL HISTORY: Metastatic disease evaluation. Malignant neoplasm of the upper third of the esophagus . * Tracking Code: BO * FINDINGS: CHEST: MEDIASTINUM AND LYMPH NODES: Heart and pericardium are unremarkable. The central airways are clear. No supraclavicular adenopathy. Hilar regions poorly evaluated without IV contrast. No dominant esophageal mass. Aortic atherosclerosis. Tortuous descending thoracic aorta. LAD coronary artery calcification. LUNGS AND PLEURA: Left lower lobe scarring laterally. Left lower lobe 2mm pulmonary nodule on image 111/6 is similar.  Left upper lobe calcified granuloma on image 97/6. Bilateral upper lobe pleural based 1 to 2 mm pulmonary nodules including on images 44 and 38 of series 6 are unchanged. No focal consolidation or pulmonary edema. No pleural effusion or pneumothorax. ABDOMEN AND PELVIS: LIVER: The liver is unremarkable. GALLBLADDER AND BILE DUCTS: Cholecystectomy. No biliary ductal dilatation. SPLEEN: No acute abnormality. PANCREAS: Moderate pancreatic atrophy. ADRENAL GLANDS: No acute abnormality. KIDNEYS, URETERS AND BLADDER: Bilateral low density renal lesions of up to 4.2 cm are likely cysts and do not warrant specific imaging follow up. No  renal or bladder calculi. No hydronephrosis. Urinary bladder is unremarkable. GI AND BOWEL: Stomach demonstrates no acute abnormality. Extensive colonic diverticulosis. Sigmoid wall thickening likely relates to muscular hypertrophy. Normal terminal ileum. There is no bowel obstruction. REPRODUCTIVE ORGANS: Hysterectomy. Moderate pelvic floor laxity. PERITONEUM AND RETROPERITONEUM: No ascites. No free air. VASCULATURE: Aorta is normal in caliber. Abdominal aortic atherosclerosis. ABDOMINAL AND PELVIS LYMPH NODES: No lymphadenopathy. BONES AND SOFT TISSUES: Osteopenia. Grade 1 L4-L5 anterolisthesis. Advanced degenerative disc disease at this level. No focal soft tissue abnormality. IMPRESSION: 1. No acute process or evidence of metastatic disease in the chest, abdomen, or pelvis. 2. Incidental findings, including: Aortic atherosclerosis (icd10-i70.0). Coronary artery atherosclerosis. Tiny similar pulmonary nodules, considered benign . Electronically signed by: Rockey Kilts MD 04/12/2024 09:02 AM EST RP Workstation: [id number]

## 2024-04-15 ENCOUNTER — Inpatient Hospital Stay: Attending: Nurse Practitioner

## 2024-04-15 ENCOUNTER — Inpatient Hospital Stay: Admitting: Nurse Practitioner

## 2024-04-15 ENCOUNTER — Other Ambulatory Visit: Payer: Self-pay | Admitting: Internal Medicine

## 2024-04-15 ENCOUNTER — Encounter: Payer: Self-pay | Admitting: Nurse Practitioner

## 2024-04-15 VITALS — BP 138/76 | HR 59 | Temp 97.8°F | Resp 17 | Wt 128.6 lb

## 2024-04-15 DIAGNOSIS — R1310 Dysphagia, unspecified: Secondary | ICD-10-CM | POA: Diagnosis not present

## 2024-04-15 DIAGNOSIS — R682 Dry mouth, unspecified: Secondary | ICD-10-CM | POA: Diagnosis not present

## 2024-04-15 DIAGNOSIS — Z923 Personal history of irradiation: Secondary | ICD-10-CM | POA: Diagnosis not present

## 2024-04-15 DIAGNOSIS — Z8501 Personal history of malignant neoplasm of esophagus: Secondary | ICD-10-CM | POA: Diagnosis present

## 2024-04-15 DIAGNOSIS — Z9221 Personal history of antineoplastic chemotherapy: Secondary | ICD-10-CM | POA: Diagnosis not present

## 2024-04-15 DIAGNOSIS — N189 Chronic kidney disease, unspecified: Secondary | ICD-10-CM | POA: Insufficient documentation

## 2024-04-15 DIAGNOSIS — C153 Malignant neoplasm of upper third of esophagus: Secondary | ICD-10-CM

## 2024-04-15 LAB — CMP (CANCER CENTER ONLY)
ALT: 14 U/L (ref 0–44)
AST: 26 U/L (ref 15–41)
Albumin: 4.1 g/dL (ref 3.5–5.0)
Alkaline Phosphatase: 72 U/L (ref 38–126)
Anion gap: 9 (ref 5–15)
BUN: 29 mg/dL — ABNORMAL HIGH (ref 8–23)
CO2: 30 mmol/L (ref 22–32)
Calcium: 9.8 mg/dL (ref 8.9–10.3)
Chloride: 102 mmol/L (ref 98–111)
Creatinine: 1.38 mg/dL — ABNORMAL HIGH (ref 0.44–1.00)
GFR, Estimated: 38 mL/min — ABNORMAL LOW (ref >60)
Glucose, Bld: 132 mg/dL — ABNORMAL HIGH (ref 70–99)
Potassium: 4.2 mmol/L (ref 3.5–5.1)
Sodium: 141 mmol/L (ref 135–145)
Total Bilirubin: 0.6 mg/dL (ref 0.0–1.2)
Total Protein: 6.5 g/dL (ref 6.5–8.1)

## 2024-04-15 LAB — CBC WITH DIFFERENTIAL (CANCER CENTER ONLY)
Abs Immature Granulocytes: 0 K/uL (ref 0.00–0.07)
Basophils Absolute: 0.1 K/uL (ref 0.0–0.1)
Basophils Relative: 1 %
Eosinophils Absolute: 0.2 K/uL (ref 0.0–0.5)
Eosinophils Relative: 5 %
HCT: 41 % (ref 36.0–46.0)
Hemoglobin: 13.3 g/dL (ref 12.0–15.0)
Immature Granulocytes: 0 %
Lymphocytes Relative: 18 %
Lymphs Abs: 0.8 K/uL (ref 0.7–4.0)
MCH: 29.5 pg (ref 26.0–34.0)
MCHC: 32.4 g/dL (ref 30.0–36.0)
MCV: 90.9 fL (ref 80.0–100.0)
Monocytes Absolute: 0.3 K/uL (ref 0.1–1.0)
Monocytes Relative: 8 %
Neutro Abs: 2.8 K/uL (ref 1.7–7.7)
Neutrophils Relative %: 68 %
Platelet Count: 158 K/uL (ref 150–400)
RBC: 4.51 MIL/uL (ref 3.87–5.11)
RDW: 13.2 % (ref 11.5–15.5)
WBC Count: 4.2 K/uL (ref 4.0–10.5)
nRBC: 0 % (ref 0.0–0.2)

## 2024-04-15 MED ORDER — LAMOTRIGINE 25 MG PO TABS: 75.0000 mg | ORAL_TABLET | Freq: Two times a day (BID) | ORAL | Status: DC

## 2024-04-15 MED ORDER — CARVEDILOL 25 MG PO TABS: ORAL_TABLET | ORAL | 0 refills | Status: AC

## 2024-04-15 NOTE — Assessment & Plan Note (Addendum)
 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 EGD 9/15 and CT neck 02/05/21 showed 6 cm mass in the cervical/upper esophagus with possible local adenopathy. 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 Shyrl felt she was not a surgical candidate.  -she received concurrent chemoRT with carbo/taxol  03/08/21 - 04/14/21.  -given PDL1+, she began immunotherapy with Nivolumab  on 05/21/21, and completed 1 year therapy in December 2023. -last EGD in November 2023 showed benign stenosis, no evidence of recurrence or residual disease. -restaging CT CAP on 07/27/2022 showed no evidence of disease. --12/06/2022 Ct Cap showed no evidence of metastatic disease. Recommended repeat CT Cap in 1 year (11/2023). Has been clinically doing well.  03/2023 -CT of soft tissue neck was negative for evidence of metastases and showed good response to prior treatment. -Swallow test and esophagram were done 05/23/2023.  These showed mild esophageal dysmotility, but otherwise normal exams. -10/10/2023 - Due to worsening dysphagia, will get PET CT and ctDNA Signatera test to evaluate for metastatic disease or recurrence. Will contact GI, Dr. Avram, for guidance regarding EGD and esophageal dilation. For now, recommend full liquid diet, avoiding solid foods as much as possible. She should carefully chew any solid foods.  10/13/2023 - PET/CT showed no specific areas of abnormal uptake to suggest recurrent or metastatic disease.  Slight thickening along the vocal cords was similar in appearance to previous CTs. ctDNA Signatera was negative. - 12/07/2023 -restaging CT CAP ordered as part of today's visit. -04/08/2024 - restaging CT CAP without contrast showing no evidence of recurrence or metastatic disease in chest, abdomen, or pelvis.  --continue cancer surveillance.  -plan for labs and follow up in 6 months, sooner if needed.

## 2024-04-17 ENCOUNTER — Ambulatory Visit: Admitting: Internal Medicine

## 2024-04-17 VITALS — BP 118/70 | HR 63 | Temp 97.6°F | Ht 66.0 in | Wt 127.8 lb

## 2024-04-17 DIAGNOSIS — M859 Disorder of bone density and structure, unspecified: Secondary | ICD-10-CM

## 2024-04-17 DIAGNOSIS — Z79899 Other long term (current) drug therapy: Secondary | ICD-10-CM

## 2024-04-17 DIAGNOSIS — K1321 Leukoplakia of oral mucosa, including tongue: Secondary | ICD-10-CM | POA: Diagnosis not present

## 2024-04-17 DIAGNOSIS — C153 Malignant neoplasm of upper third of esophagus: Secondary | ICD-10-CM | POA: Diagnosis not present

## 2024-04-17 DIAGNOSIS — E2839 Other primary ovarian failure: Secondary | ICD-10-CM

## 2024-04-17 DIAGNOSIS — G500 Trigeminal neuralgia: Secondary | ICD-10-CM

## 2024-04-17 NOTE — Patient Instructions (Addendum)
 Good to see you today .  Updating med list .  See  your dentist   for the mouth white area.   Ordering a dexa scan follow  up Please call [phone] to schedule a Bone Density (DexaScan) a the [facility] office at [address] [facility].    Optimize protein in diet   1-1.3 grams per kg per day  about 60 -65 grams protein per day   Yearly check depending   Ok to continue with inhaler use if stable .

## 2024-04-17 NOTE — Progress Notes (Signed)
 "  Chief Complaint  Patient presents with   Medication Refill   Medical Management of Chronic Issues    HPI: [name] 83 y.o. 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 Little  etoh  Weight  working on not losing weight.  No recent fall or fracture .  Past Medical History:  Diagnosis Date   Abdominal pain 05/29/2013   s/p rx of cephalo resistant e coli   but last rx NG  now residular ?  bladder sx repeat cx sx rx to ty and uro consult    ADJ DISORDER WITH MIXED ANXIETY & DEPRESSED MOOD 03/03/2010   Qualifier: Diagnosis of  By: Charlett MD, Apolinar POUR    Agent resistant to multiple antibiotics 05/29/2013   e coli   bu NG on fu.     Anemia    Anxiety    ARF (acute renal failure) 03/12/2015   Closed head injury 02/01/2011   from syncope and had scalp laceration  neg ct .     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    Pyelonephritis 03/12/2015   Sensation of pain in anesthetized distribution of trigeminal nerve    Syncope 02/01/2011   In shower on  vacation  sustained head laceration  8 sutures Had ed visit neg head ct labs and x ray    Trigeminal neuralgia pain     Family History  Problem Relation Age of Onset   Cancer Mother        uterine   Ovarian cancer Mother    Stroke Mother    Alcohol abuse Father    Stroke Father    Diabetes Brother    Cancer Paternal Aunt        leukemia, unknown type   Seizures Daughter    Hypertension Other    Colon cancer Neg Hx    Dementia Neg Hx    Alzheimer's disease Neg Hx     Social History   Socioeconomic History   Marital status: Married    Spouse name: Not on file   Number of children: 2   Years of education: Not on file   Highest education level: Not on file  Occupational History    Comment: retired customer service manager  Tobacco  Use   Smoking status: Never   Smokeless tobacco: Never  Vaping Use   Vaping status: Never Used  Substance and Sexual Activity   Alcohol use: Yes    Comment: occ wine   Drug use: No   Sexual activity: Not on file  Other Topics Concern   Not on file  Social History Narrative   Married   HH of 2-3 (god daughter)   Pets 2 dogs   Non smoker    Child is a physician   G2P2      Caffeine : 2 cups/day   Retired    Social Drivers of Health   Tobacco Use: Low Risk (04/25/2024)   Patient History    Smoking Tobacco Use: Never    Smokeless Tobacco Use: Never    Passive Exposure: Not on file  Financial Resource Strain: Low Risk (04/12/2022)   Overall Financial Resource Strain (CARDIA)    Difficulty of Paying Living Expenses: Not hard at all  Food Insecurity: No Food Insecurity (08/08/2022)   Hunger Vital Sign    Worried About Running Out of Food in the Last Year: Never true    Ran Out of Food in the Last Year: Never true  Transportation Needs: No Transportation Needs (08/08/2022)   PRAPARE - Administrator, Civil Service (Medical): No    Lack of Transportation (Non-Medical): No  Physical Activity: Insufficiently Active (04/12/2022)    Exercise Vital Sign    Days of Exercise per Week: 3 days    Minutes of Exercise per Session: 30 min  Stress: No Stress Concern Present (04/12/2022)   [employer] of Occupational Health - Occupational Stress Questionnaire    Feeling of Stress : Not at all  Social Connections: Moderately Isolated (04/12/2022)   Social Connection and Isolation Panel    Frequency of Communication with Friends and Family: More than three times a week    Frequency of Social Gatherings with Friends and Family: More than three times a week    Attends Religious Services: Never    Database Administrator or Organizations: No    Attends Banker Meetings: Never    Marital Status: Married  Depression (PHQ2-9): Low Risk (04/15/2024)   Depression (PHQ2-9)    PHQ-2 Score: 0  Alcohol Screen: Low Risk (04/12/2022)   Alcohol Screen    Last Alcohol Screening Score (AUDIT): 0  Housing: Low Risk (04/12/2022)   Housing    Last Housing Risk Score: 0  Utilities: Not At Risk (04/18/2023)   AHC Utilities    Threatened with loss of utilities: No  Health Literacy: Not on file    Outpatient Medications Prior to Visit  Medication Sig Dispense Refill   acetaminophen  (TYLENOL ) 500 MG tablet Take 500-1,000 mg by mouth every 4 (four) hours as needed for headache, mild pain or moderate pain.     albuterol  (VENTOLIN  HFA) 108 (90 Base) MCG/ACT inhaler INHALE 1 TO 2 PUFFS INTO THE LUNGS EVERY 6 HOURS AS NEEDED FOR WHEEZING OR SHORTNESS OF BREATH 6.7 g 0   Calcium Carb-Cholecalciferol 600-20 MG-MCG TABS Take by mouth.     carvedilol  (COREG ) 25 MG tablet TAKE 1 TABLET BY MOUTH TWICE DAILY WITH A MEAL --PLEASE  SCHEDULE  AN  APPT  FOR  FURTHER  REFILLS 180 tablet 0   estradiol (ESTRACE) 0.1 MG/GM vaginal cream APPLY 1/2 VAGINALLY AT BEDTIME TWICE WEEKLY     famotidine  (PEPCID ) 20 MG tablet One after supper 30 tablet 11   fluticasone  (FLONASE )  50 MCG/ACT nasal spray Place 2 sprays into both nostrils 2 (two) times daily.  16 g 6   loperamide  (IMODIUM ) 2 MG capsule 2 mg daily as needed for diarrhea or loose stools. Per tube     Multiple Minerals-Vitamins (CAL-MAG-ZINC -D PO) Take 1 tablet by mouth 2 (two) times daily.     Multiple Vitamin (MULTIVITAMIN WITH MINERALS) TABS tablet Place 1 tablet into feeding tube in the morning.     omeprazole  (PRILOSEC) 40 MG capsule Take 30- 60 min before your first and last meals of the day 90 capsule 3   ondansetron  (ZOFRAN -ODT) 4 MG disintegrating tablet DISSOLVE 1 TO 2 TABLETS(4 TO 8 MG) ON THE TONGUE EVERY 8 HOURS AS NEEDED FOR NAUSEA OR VOMITING 30 tablet 1   Polyethyl Glycol-Propyl Glycol (SYSTANE) 0.4-0.3 % GEL ophthalmic gel Place 1 application into both eyes 2 (two) times daily as needed (dry eyes).     prochlorperazine  (COMPAZINE ) 10 MG tablet TAKE 1 TABLET(10 MG) BY MOUTH EVERY 6 HOURS AS NEEDED FOR NAUSEA OR VOMITING 30 tablet 1   furosemide  (LASIX ) 20 MG tablet Take 1 tablet by mouth once daily 90 tablet 0   lamoTRIgine  (LAMICTAL ) 25 MG tablet Take 3 tablets (75 mg total) by mouth 2 (two) times daily. (Patient taking differently: Take 75 mg by mouth daily. Pt takes 25mg  in am and 50mg  in PM)     Nutritional Supplements (NUTREN 1.5) LIQD Place 237 mLs into feeding tube daily.     No facility-administered medications prior to visit.     EXAM:  BP 118/70 (BP Location: Left Arm, Patient Position: Sitting, Cuff Size: Normal)   Pulse 63   Temp 97.6 F (36.4 C) (Oral)   Ht 5' 6 (1.676 m)   Wt 127 lb 12.8 oz (58 kg)   SpO2 96%   BMI 20.63 kg/m   Body mass index is 20.63 kg/m. Wt Readings from Last 3 Encounters:  04/25/24 127 lb (57.6 kg)  04/17/24 127 lb 12.8 oz (58 kg)  04/15/24 128 lb 9.6 oz (58.3 kg)    GENERAL: vitals reviewed and listed above, alert, oriented, appears well hydrated and in no acute distress HEENT: atraumatic, conjunctiva  clear, no obvious abnormalities on inspection of external nose and ears NECK: no obvious masses on inspection  palpation  LUNGS: clear to auscultation bilaterally, no wheezes, rales or rhonchi, good air movement Abd no masses  CV: HRRR, no clubbing cyanosis or  peripheral edema nl cap refill  MS: moves all extremities without noticeable focal  abnormality PSYCH: pleasant and cooperative, no obvious depression or anxiety nl speech and interaction Lab Results  Component Value Date   WBC 4.2 04/15/2024   HGB 13.3 04/15/2024   HCT 41.0 04/15/2024   PLT 158 04/15/2024   GLUCOSE 132 (H) 04/15/2024   CHOL 279 (H) 05/27/2019   TRIG 78.0 05/27/2019   HDL 118.80 05/27/2019   LDLDIRECT 135.9 10/16/2012   LDLCALC 145 (H) 05/27/2019   ALT 14 04/15/2024   AST 26 04/15/2024   NA 141 04/15/2024   K 4.2 04/15/2024   CL 102 04/15/2024   CREATININE 1.38 (H) 04/15/2024   BUN 29 (H) 04/15/2024   CO2 30 04/15/2024   TSH 2.302 04/07/2022   INR 1.1 02/22/2021   HGBA1C 5.2 11/09/2021   BP Readings from Last 3 Encounters:  04/25/24 (!) 140/77  04/17/24 118/70  04/15/24 138/76  Egfr 38 decrease   last lab per cancer center  improved from 7 mos ago  Last vitamin D  Lab Results  Component Value Date   VD25OH 19.97 (L) 05/25/2018    ASSESSMENT AND PLAN:  Discussed the following assessment and plan:  Medication management - albuterol  fuosemide and carvedilol  byt this provider  Estrogen deficiency - Plan: DG BONE DENSITY (DXA)  Low bone density - Plan: DG BONE DENSITY (DXA)  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  Patient Instructions  Good to see you today .  Updating med list .  See  your dentist   for the mouth white area.   Ordering a dexa scan follow   up Please call [phone] to schedule a Bone Density (DexaScan) a the [facility] office at [address] [facility]    Optimize protein in diet   1-1.3 grams per kg per day  about 60 -65 grams protein per day   Yearly check depending   Ok to continue with inhaler use if stable .     [name] K. Amere Iott M.D.  After record review  plan lab in 4-6 months A1c, urine microalbumin bmp and vit D  DX  vit d level dec gfr  and hyperglycemia  "

## 2024-04-25 ENCOUNTER — Encounter: Payer: Self-pay | Admitting: Adult Health

## 2024-04-25 ENCOUNTER — Ambulatory Visit: Admitting: Adult Health

## 2024-04-25 VITALS — BP 140/77 | HR 64 | Ht 66.0 in | Wt 127.0 lb

## 2024-04-25 DIAGNOSIS — G500 Trigeminal neuralgia: Secondary | ICD-10-CM | POA: Diagnosis not present

## 2024-04-25 DIAGNOSIS — Z5181 Encounter for therapeutic drug level monitoring: Secondary | ICD-10-CM | POA: Diagnosis not present

## 2024-04-25 MED ORDER — LAMOTRIGINE 25 MG PO TABS: 50.0000 mg | ORAL_TABLET | Freq: Two times a day (BID) | ORAL | 11 refills | Status: AC

## 2024-04-25 NOTE — Progress Notes (Signed)
 PATIENT: [name] DOB: November 11, 1940  REASON FOR VISIT: follow up HISTORY FROM: patient   Chief Complaint  Patient presents with   Follow-up    Pt in 4 alone Pt here for [name] f/u Pt was concerned about another neurologist since Dr ines is no longer  in [facility] . Pt wants a referral to a neurosurgereon [place] in [place] who specializes in [name]     HISTORY OF PRESENT ILLNESS: Today 04/25/24:  [name] is a 83 y.o. female with a history of trigeminal neuralgia. Returns today for follow-up.  She saw Dr. Darleen several months ago.  Keppra  was added but she reports it made her feel overmedicated.  She currently taking Lamictal  25 mg in the am and 50 mg at night.  She reports that that has been beneficial but she still gets a mild sensation on the left side of her face and sometimes feels like she has a headache that starts at the cheek and radiates up to the hairline.  She is questioning whether she should do 50 mg twice a day.  She had gamma knife procedure in April with Dr. Rosine. She returns today for follow-up  09/19/23 [name] is a 83 y.o. female with a history of trigeminal neuralgia. Returns today for follow-up.  She is currently taking Lamictal  150 mg twice a day.  States that she cannot tolerate a higher dose because it affects her cognition her ability to concentrate and her gait and balance.  She has been on several different medications in the past.  She did go see Dr. Rosine at the beginning of April for another gamma knife procedure.  She is not sure how helpful that has been.  She states that she notices discomfort when she is trying to brush her teeth.  She also has a hard time opening her mouth wide when eating.  She returns today for an evaluation.  Tried amitriptyline , decadron , amitriptyline , nerve blocks, keppra ,gabapentin , muscle relaxers, nortriptyline , nycynta, trileptal ,baclofen ,oxy, tizanidine , cymbalta , topiramate ,  effexor ,tramadol .   07/18/22: [name] is a 83 y.o. female who has been followed in this office for trigeminal neuralgia. Returns today for follow-up.  She reports that the gamma knife procedure has greatly improved her discomfort. Only had 1 episode of pain maybe 3-4 weeks ago.  Patient reports that she has has constant pins and needles  on the right side of the face. Its there most of the time. Reports that she can live with it if she has to but does find it quite annoying. Spoke PA in  [place] and she advised the patient that this could be symptoms post gamma knife procedure.    Patient had MRI of the brain back in November with some abnormalities.  Dr. Ines requested to repeat in 3 months.  MRI brain with and without contrast March 30, 2022: IMPRESSION: 1. Postsurgical changes from prior right trigeminal nerve decompression with Teflon pledget seen superior and lateral to the right trigeminal nerve. 2. Prominent enhancement of the cisternal segment of the right trigeminal nerve, new from prior MRI, likely reflecting neuritis. 3. Mild chronic microvascular ischemic changes of the white matter.  03/17/2022: Patient with H&N cancer, we have been treating her for [name] and exhasuted most m Lovely 83year old femle with severe pain. She has sufferered with trigeminal neuralgia ([name]) since 2008, in 2013 had microvascular decompression at [facility] and did great for 9 years with recurrent [name](may need revision?). Very difficult case,  patient had hyponatremia with Tegretol , tried her on Trileptal , baclofen ,lamictal  abd have worried about side effects of having multiple AEDs or baclofen  in an 83 year old. tried nerve blocks . I'ver tried botox with other patients in the past with poor results, and risk of affecting CN 7,Had gamma knife as well. Other options include gamma knife, balloon, rhizotomy or others and referred to Dr. Rosine at [facility]. Discussed at length with patient. She has  been a poor medication taker, adjusting her dose without letting us  know.Tried amitriptyline , decadron , amitriptyline , nerve blocks, keppra , nerve bkocks, muscle relaxers, nortriptyline , nycynta, trileptal ,baclofen ,oxy, tizanidine , cymbalta , topiramate , effexor ,tramadol . Her last procedure was Gamma knife 12/2020 then at [facility] in 12/2021. She's been to multiple neurosurgeons including [facility]   She thinks the cancer is under control. She couldn't eat for a while. She is having very different symptoms, the [name] seems to have reduced. Just on the lamictal  now and doing well at 300mg  a day. [name] was always on the right but was always painful. Now having different symptoms more tingling in her cheek bone and above the right chin which is different. Not like trigeminal neuralgia.WOuld repeat MRi brain esp in the setting of head and neck cancer to rule out amy metastasis or spreading to the brain but this is likely remnants of trigaminal neuropathy/neuralgia.swelling in the right side of the face as well.   Patient complains of symptoms per HPI as well as the following symptoms: facial numbness and swelling . Pertinent negatives and positives per HPI. All others negative   BUN 26, creat 0.93   Today 08/12/2021: [name] is an 83 year old female with a history of Trigeminal Neuralgia. She returns today for follow-up. Reports that she is having breakthrough pain in the right jaw.  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  She states that she starts coughing for a long period of time the pain occurs. Unable to tolerate high dose of oxycarbamazepine.  She states that she does notices that she has been on these medications her balance is slightly off and she feels a little foggy headed.   Currently taking Lamictal  250 mg BID and Oxycarbamazine 150 mg BID   11/2/22[name] is a 83 y.o. female with a history of trigeminal neuralgia here today for a follow up. Patient had  Gamma Knife Procedure in August, she had some initial relief then she started having breakthrough pain. Having breakthrough TN episodes nearly daily, they are worse in the evenings. She states that she rarely gets it during sleep, and last night she had woken from her sleep with terrible pain.   Patient endorses that the oxcarbamazepine starting causing side effects such as mobility (initially using cane, now has to use walker), vision changes (diminished vision, double vision (line and dots) and cognitive issues. She stated when she had decreased her dose to 150 mg BID, she noted an increased improvement of all above and her ambulation has improved slightly as well. Her husband then said that due to breakthru pain he increased her lamotrigine  to 200mg  po bid, they have done this for three days.    Patient would like to see Dr. Unice, unfortunately he is out of the office for the next 12 weeks.Encouraged patient to reach out to his office to determine if there is another provider that specializes in those treatments in his office.    HISTORY  07/08/2020:   Referral to Dr. Rosine at [facility] and then also  to increase lamotrigine  to 200mg  po bid. She has breakthrough pain. It gets worse around 8pm. Just before that 8pm deadline it gets bad and when she eats it breaks through. She has an appointment with Dr. laxton on 6/8. She has to bring all imaging. She had vascular decompression in 2013, lasted 9 years, may need revision?    Patient is here on concern of memory changes. She says in the last month she has gotten much better as far as the sciatica goes. She can walk around te house. She uses a youth worker around the house. She did therapy last spring, and so she is trying to strengthen the muscles in her legs. Husband provides information, she used to write all of her checks but in the last month she cannot stay in line, she was putting her name where the dollars should be, before a month ago everything was fine,  worse in the morning, she goes to bed after her pills at night. She is very lucid right now but in the morning she gets unstable and is confused. She can almost fall after she takes her medicine. She feels fantastic now on the oxcarb and lamictal .    MRI brain: No evidence of recent infarction, hemorrhage, or mass.  Stable postoperative changes of prior trigeminal nerve decompression.Stable mild chronic microvascular ischemic changes' . MRI cervical spine:  No abnormal cord signal.  Multilevel degenerative changes as detailed above. No high-grade canal stenosis. Foraminal narrowing is greatest on the left at C5-C6.   TSH 0.88, B12 933,    Patient complains of symptoms per HPI as well as the following symptoms: imbalance, falls . Pertinent negatives and positives per HPI. All others negative   05/11/2020: She is taking more Lamictal . She had the procedure with Dr. Unice, her right face was numb for some time, there ois discomfort now, once in a while she gets the lightning strike, the procedure made a huge difference. She is on 100mg  twice a day of the Lamictal .    [name] is a 83 y.o. female here as requested by [name], Wanda K, MD for Trigeminal neuralgia. 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 Phyllistine in the past, remote kidney infection, hypertension, hyperlipidemia, hard of hearing, fatty liver, anxiety and depression.  I reviewed Dr. Rebekah notes, she was seen there for evaluation of bilateral leg pain, increased leg pain left greater than right 6 months, weakness in both legs, she had epidural steroid injections x3, most recently left L4-L5 in November with no relief, numerous medications trialed most recently Cymbalta , Tylenol , Motrin .  Patient is on carbamazepine  500 mg twice daily for trigeminal neuralgia for  which she underwent craniotomy and microvascular decompression at the [facility].,  She was referred here for trigeminal neuralgia, I reviewed her neurologic examination which included normal strength, normal tone, normal reflexes, normal cranial nerve exams, Lhermitte's negative, Romberg negative no pronator drift, Hoffmann's normal, no clonus, diagnosed with lumbar stenosis with synovial cyst causing severe low back pain and bilateral lower extremity pain with left greater than right leg syndromes, L4-L5 laminectomy with resection of synovial cyst to be performed, referred over here for trigeminal neuralgia. Reviewed Dr. Lynne notes, patient sodium was affected on Tegretol .    She had surgery at [facility] in 2013 for trigeminal neuralgia, she was told hopefully the surgery would help but since then she had been increasing  the medication and increased to 1200mg  a day and was decreased due to blood work and side effects, and now she has stopped the carbamazepine . She was getting side effects from the carbamazepine  but it was helping. Dr. Unice placed her on Oxcarbazepine (trileptal ) and now she is on cymbalta , she started the Trileptal  a few weeks ago. She was started on 300mg  and just a few days ago she was increased to 600mg  twice a day(1200mg  a day). She is not taking the cymbalta , was likely prescribed for low back pain (Dr. Eldonna). The pain starts on the right and shoots down her jaw, chewing, talking makes it worse, severe, shooting, same pain she has always experienced. The pain is severe today. She says she is unstable and tired, gets better after 45 minutes. When she did the clock test she could not draw the clock. No coordination. All the numbers were in sequence. She could not draw.       REVIEW OF SYSTEMS: Out of a complete 14 system review of symptoms, the patient complains only of the following symptoms, and all other reviewed systems are negative.  ALLERGIES: Allergies  Allergen  Reactions   Other Rash   Bactrim [Sulfamethoxazole-Trimethoprim] Hives and Itching   Hydrocodone  Other (See Comments)    Rebound headaches   Sulfa Antibiotics Other (See Comments)    HOME MEDICATIONS: Outpatient Medications Prior to Visit  Medication Sig Dispense Refill   acetaminophen  (TYLENOL ) 500 MG tablet Take 500-1,000 mg by mouth every 4 (four) hours as needed for headache, mild pain or moderate pain.     albuterol  (VENTOLIN  HFA) 108 (90 Base) MCG/ACT inhaler INHALE 1 TO 2 PUFFS INTO THE LUNGS EVERY 6 HOURS AS NEEDED FOR WHEEZING OR SHORTNESS OF BREATH 6.7 g 0   Calcium Carb-Cholecalciferol 600-20 MG-MCG TABS Take by mouth.     carvedilol  (COREG ) 25 MG tablet TAKE 1 TABLET BY MOUTH TWICE DAILY WITH A MEAL --PLEASE  SCHEDULE  AN  APPT  FOR  FURTHER  REFILLS 180 tablet 0   estradiol (ESTRACE) 0.1 MG/GM vaginal cream APPLY 1/2 VAGINALLY AT BEDTIME TWICE WEEKLY     famotidine  (PEPCID ) 20 MG tablet One after supper 30 tablet 11   fluticasone  (FLONASE ) 50 MCG/ACT nasal spray Place 2 sprays into both nostrils 2 (two) times daily. 16 g 6   furosemide  (LASIX ) 20 MG tablet Take 1 tablet by mouth once daily 90 tablet 0   lamoTRIgine  (LAMICTAL ) 25 MG tablet Take 3 tablets (75 mg total) by mouth 2 (two) times daily. (Patient taking differently: Take 75 mg by mouth daily. Pt takes 25mg  in am and 50mg  in PM)     loperamide  (IMODIUM ) 2 MG capsule 2 mg daily as needed for diarrhea or loose stools. Per tube     Multiple Minerals-Vitamins (CAL-MAG-ZINC -D PO) Take 1 tablet by mouth 2 (two) times daily.     Multiple Vitamin (MULTIVITAMIN WITH MINERALS) TABS tablet Place 1 tablet into feeding tube in the morning.     omeprazole  (PRILOSEC) 40 MG capsule Take 30- 60 min before your first and last meals of the day 90 capsule 3   ondansetron  (ZOFRAN -ODT) 4 MG disintegrating tablet DISSOLVE 1 TO 2 TABLETS(4 TO 8 MG) ON THE TONGUE EVERY 8 HOURS AS NEEDED FOR NAUSEA OR VOMITING 30 tablet 1   Polyethyl  Glycol-Propyl Glycol (SYSTANE) 0.4-0.3 % GEL ophthalmic gel Place 1 application into both eyes 2 (two) times daily as needed (dry eyes).     prochlorperazine  (COMPAZINE ) 10 MG tablet  TAKE 1 TABLET(10 MG) BY MOUTH EVERY 6 HOURS AS NEEDED FOR NAUSEA OR VOMITING 30 tablet 1   Nutritional Supplements (NUTREN 1.5) LIQD Place 237 mLs into feeding tube daily.     No facility-administered medications prior to visit.    PAST MEDICAL HISTORY: Past Medical History:  Diagnosis Date   Abdominal pain 05/29/2013   s/p rx of cephalo resistant e coli   but last rx NG  now residular ?  bladder sx repeat cx sx rx to ty and uro consult    ADJ DISORDER WITH MIXED ANXIETY & DEPRESSED MOOD 03/03/2010   Qualifier: Diagnosis of  By: Charlett MD, Apolinar POUR    Agent resistant to multiple antibiotics 05/29/2013   e coli   bu NG on fu.     Anemia    Anxiety    ARF (acute renal failure) 03/12/2015   Closed head injury 02/01/2011   from syncope and had scalp laceration  neg ct .     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    Pyelonephritis 03/12/2015   Sensation of pain in anesthetized distribution of trigeminal nerve    Syncope 02/01/2011   In shower on vacation  sustained head laceration  8 sutures Had ed visit neg head ct labs and x ray    Trigeminal neuralgia pain     PAST SURGICAL HISTORY: Past Surgical History:  Procedure  Laterality Date   ABDOMINAL HYSTERECTOMY  2002   tubal   BACK SURGERY     2 times, for sciatic nerve pain   BALLOON DILATION  06/21/2021   Procedure: DILATION BALLOON used for esophageal stricture;  Surgeon: Wilhelmenia Aloha Raddle., MD;  Location: [facility];  Service: Gastroenterology;;   CARDIAC CATHETERIZATION  2000   chest pains neg   CHOLECYSTECTOMY N/A 02/21/2017   Procedure: LAPAROSCOPIC CHOLECYSTECTOMY WITH INTRAOPERATIVE CHOLANGIOGRAM;  Surgeon: Eletha Boas, MD;  Location: [facility];  Service: General;  Laterality: N/A;   COLONOSCOPY     multiple   CRANIOTOMY  12/09/2011   nerve decompression right trigeminal    DIRECT LARYNGOSCOPY N/A 02/12/2021   Procedure: DIRECT LARYNGOSCOPY AND BIOPSY POSSIBLE FROZEN;  Surgeon: Ethyl Lonni BRAVO, MD;  Location: [facility];  Service: ENT;  Laterality: N/A;   DOPPLER ECHOCARDIOGRAPHY  2009   nl lv function mild lv dilitation   ESOPHAGOGASTRODUODENOSCOPY (EGD) WITH PROPOFOL  N/A 06/21/2021   Procedure: ESOPHAGOGASTRODUODENOSCOPY (EGD) WITH PROPOFOL ;  Surgeon: Wilhelmenia Aloha Raddle., MD;  Location: [facility];  Service: Gastroenterology;  Laterality: N/A;  Request Fluoroscopy; Plan in for dilation   ESOPHAGOGASTRODUODENOSCOPY (EGD) WITH PROPOFOL  N/A 07/19/2021   Procedure: ESOPHAGOGASTRODUODENOSCOPY (EGD) WITH PROPOFOL ;  Surgeon: Wilhelmenia Aloha Raddle., MD;  Location: [facility];  Service: Endoscopy;  Laterality: N/A;  Dilation fluoro   ESOPHAGOGASTRODUODENOSCOPY (EGD) WITH PROPOFOL  N/A 08/05/2021   Procedure: ESOPHAGOGASTRODUODENOSCOPY (EGD) WITH PROPOFOL  - fluoro;  Surgeon: Mansouraty, Aloha Raddle., MD;  [facility];  Service: Gastroenterology;  Laterality: N/A;   ESOPHAGOGASTRODUODENOSCOPY (EGD) WITH PROPOFOL  N/A 08/23/2021   Procedure: ESOPHAGOGASTRODUODENOSCOPY (EGD) WITH PROPOFOL ;  Surgeon: Avram Lupita BRAVO, MD;  Location: [facility];  Service: Gastroenterology;  Laterality: N/A;  Using Fluoroscopy and  esophageal dilation   ESOPHAGOGASTRODUODENOSCOPY (EGD) WITH PROPOFOL  N/A 09/22/2021   Procedure: ESOPHAGOGASTRODUODENOSCOPY (EGD) WITH PROPOFOL ;  Surgeon: Avram Lupita BRAVO, MD;  Location: [facility];  Service: Gastroenterology;  Laterality: N/A;  EGD with savary and flour   ESOPHAGOGASTRODUODENOSCOPY (EGD) WITH PROPOFOL  N/A 12/07/2021   Procedure: ESOPHAGOGASTRODUODENOSCOPY (EGD) WITH PROPOFOL ;  Surgeon: Avram Lupita BRAVO, MD;  Location: [facility];  Service: Gastroenterology;  Laterality: N/A;  Schedule this with Fluroscopy, it is a savary dilation   ESOPHAGOGASTRODUODENOSCOPY (EGD) WITH PROPOFOL  N/A 04/05/2022   Procedure: ESOPHAGOGASTRODUODENOSCOPY (EGD) WITH PROPOFOL ;  Surgeon: Avram Lupita BRAVO, MD;  Location: [facility];  Service: Gastroenterology;  Laterality: N/A;  EGD /savary dilation with fluoro   EYE SURGERY Bilateral    ioc for catatracts   GAMMETE INTRAFALLOPIAN TUBE TRANFER Right 2025   GASTROSTOMY N/A 02/26/2021   Procedure: OPEN GASTROSTOMY TUBE PLACEMENT;  Surgeon: Eletha Boas, MD;  Location: [facility];  Service: General;  Laterality: N/A;   IR GASTROSTOMY TUBE MOD SED  02/22/2021   IR GASTROSTOMY TUBE REMOVAL  02/10/2022   IR REPLACE G-TUBE SIMPLE WO FLUORO  09/07/2021   laparoscopic gallbladder surgery  02/16/2017   Fax from [facility]   OOPHORECTOMY Bilateral 2002   rt shoulder surgery     SAVORY DILATION N/A 06/21/2021   Procedure: SAVORY DILATION;  Surgeon: Wilhelmenia Aloha Raddle., MD;  Location: [facility];  Service: Gastroenterology;  Laterality: N/A;   SAVORY DILATION N/A 07/19/2021   Procedure: SAVORY DILATION;  Surgeon: Wilhelmenia Aloha Raddle., MD;  Location: [facility];  Service: Endoscopy;  Laterality: N/A;   SAVORY DILATION N/A 08/05/2021   Procedure: SAVORY DILATION;  Surgeon: Wilhelmenia Aloha Raddle., MD;  Location: [facility];  Service: Gastroenterology;  Laterality: N/A;   SAVORY DILATION N/A 08/23/2021   Procedure: SAVORY DILATION;   Surgeon: Avram Lupita BRAVO, MD;  Location: [facility];  Service: Gastroenterology;  Laterality: N/A;   SAVORY DILATION N/A 09/22/2021   Procedure: SAVORY DILATION;  Surgeon: Avram Lupita BRAVO, MD;  Location: [facility];  Service: Gastroenterology;  Laterality: N/A;   SAVORY DILATION N/A 12/07/2021   Procedure: SAVORY DILATION;  Surgeon: Avram Lupita BRAVO, MD;  Location: [facility];  Service: Gastroenterology;  Laterality: N/A;   SAVORY DILATION N/A 04/05/2022   Procedure: SAVORY DILATION;  Surgeon: Avram Lupita BRAVO, MD;  Location: [facility];  Service: Gastroenterology;  Laterality: N/A;    FAMILY HISTORY: Family History  Problem Relation Age of Onset   Cancer Mother        uterine   Ovarian cancer Mother    Stroke Mother    Alcohol abuse Father    Stroke Father    Diabetes Brother    Cancer Paternal Aunt        leukemia, unknown type   Seizures Daughter    Hypertension Other    Colon cancer Neg Hx    Dementia Neg Hx    Alzheimer's disease Neg Hx     SOCIAL HISTORY: Social History   Socioeconomic History   Marital status: Married  Spouse name: Not on file   Number of children: 2   Years of education: Not on file   Highest education level: Not on file  Occupational History    Comment: retired customer service manager  Tobacco Use   Smoking status: Never   Smokeless tobacco: Never  Vaping Use   Vaping status: Never Used  Substance and Sexual Activity   Alcohol use: Yes    Comment: occ wine   Drug use: No   Sexual activity: Not on file  Other Topics Concern   Not on file  Social History Narrative   Married   HH of 2-3 (god daughter)   Pets 2 dogs   Non smoker    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, community   G2P2      Caffeine : 2 cups/day   Retired    Chief Executive Officer Drivers of Corporate Investment Banker Strain: Low Risk  (04/12/2022)   Overall Financial Resource Strain (CARDIA)    Difficulty of Paying Living Expenses: Not hard at all  Food Insecurity: No Food Insecurity (08/08/2022)    Hunger Vital Sign    Worried About Running Out of Food in the Last Year: Never true    Ran Out of Food in the Last Year: Never true  Transportation Needs: No Transportation Needs (08/08/2022)   PRAPARE - Administrator, Civil Service (Medical): No    Lack of Transportation (Non-Medical): No  Physical Activity: Insufficiently Active (04/12/2022)   Exercise Vital Sign    Days of Exercise per Week: 3 days    Minutes of Exercise per Session: 30 min  Stress: No Stress Concern Present (04/12/2022)   [employer] of Occupational Health - Occupational Stress Questionnaire    Feeling of Stress : Not at all  Social Connections: Moderately Isolated (04/12/2022)   Social Connection and Isolation Panel    Frequency of Communication with Friends and Family: More than three times a week    Frequency of Social Gatherings with Friends and Family: More than three times a week    Attends Religious Services: Never    Database Administrator or Organizations: No    Attends Banker Meetings: Never    Marital Status: Married  Catering Manager Violence: Not At Risk (04/12/2022)   Humiliation, Afraid, Rape, and Kick questionnaire    Fear of Current or Ex-Partner: No    Emotionally Abused: No    Physically Abused: No    Sexually Abused: No      PHYSICAL EXAM  Vitals:   04/25/24 1016  BP: (!) 140/77  Pulse: 64  Weight: 127 lb (57.6 kg)  Height: 5' 6 (1.676 m)    Body mass index is 20.5 kg/m.  Generalized: Well developed, in no acute distress   Neurological examination  Mentation: Alert oriented to time, place, history taking. Follows all commands speech and language fluent Cranial nerve II-XII: Pupils were equal round reactive to light. Extraocular movements were full, visual field were full on confrontational test. Facial sensation and strength were normal.  Head turning and shoulder shrug  were normal and symmetric. Motor: The motor testing reveals 5 over 5  strength of all 4 extremities. Good symmetric motor tone is noted throughout.  Sensory: Sensory testing is intact to soft touch on all 4 extremities. No evidence of extinction is noted.  Coordination: Cerebellar testing reveals good finger-nose-finger and heel-to-shin bilaterally.  Gait and station: Uses a cane when ambulating.  DIAGNOSTIC DATA (LABS, IMAGING, TESTING) - I reviewed  patient records, labs, notes, testing and imaging myself where available.  Lab Results  Component Value Date   WBC 4.2 04/15/2024   HGB 13.3 04/15/2024   HCT 41.0 04/15/2024   MCV 90.9 04/15/2024   PLT 158 04/15/2024      Component Value Date/Time   NA 141 04/15/2024 0949   NA 136 03/24/2021 1508   K 4.2 04/15/2024 0949   CL 102 04/15/2024 0949   CO2 30 04/15/2024 0949   GLUCOSE 132 (H) 04/15/2024 0949   GLUCOSE 97 03/17/2006 0820   BUN 29 (H) 04/15/2024 0949   BUN 26 03/24/2021 1508   CREATININE 1.38 (H) 04/15/2024 0949   CALCIUM 9.8 04/15/2024 0949   PROT 6.5 04/15/2024 0949   PROT 6.2 03/24/2021 1508   ALBUMIN 4.1 04/15/2024 0949   ALBUMIN 4.2 03/24/2021 1508   AST 26 04/15/2024 0949   ALT 14 04/15/2024 0949   ALKPHOS 72 04/15/2024 0949   BILITOT 0.6 04/15/2024 0949   GFRNONAA 38 (L) 04/15/2024 0949   GFRAA 62 07/08/2020 1653   Lab Results  Component Value Date   CHOL 279 (H) 05/27/2019   HDL 118.80 05/27/2019   LDLCALC 145 (H) 05/27/2019   LDLDIRECT 135.9 10/16/2012   TRIG 78.0 05/27/2019   CHOLHDL 2 05/27/2019   Lab Results  Component Value Date   HGBA1C 5.2 11/09/2021   Lab Results  Component Value Date   VITAMINB12 933 (H) 07/01/2020   Lab Results  Component Value Date   TSH 2.302 04/07/2022      ASSESSMENT AND PLAN 83 y.o. year old female  has a past medical history of Abdominal pain (05/29/2013), ADJ DISORDER WITH MIXED ANXIETY & DEPRESSED MOOD (03/03/2010), Agent resistant to multiple antibiotics (05/29/2013), Anemia, Anxiety, ARF (acute renal failure)  (03/12/2015), Closed head injury (02/01/2011), Closed head injury (5-6 yrs ago), Colitis (11/27/2017), Complication of anesthesia, Depression, esophageal ca (01/2021), Fatty liver, Gall stones (2016), GERD (gastroesophageal reflux disease), Hearing aid worn, HOH (hard of hearing), Hyperlipidemia, Hypertension, Kidney infection, Medication side effect (09/02/2010), Migraine, Polycythemia, Positive PPD, Pyelonephritis (03/12/2015), Sensation of pain in anesthetized distribution of trigeminal nerve, Syncope (02/01/2011), and Trigeminal neuralgia pain. here with:  1.  Trigeminal neuralgia  Increase Lamictal  to 50 mg twice a day Reviewed blood work from November CBC and CMP Advised that symptoms worsen she should let us  know Follow-up in 6 to 8 months or sooner if needed  Meds ordered this encounter  Medications   lamoTRIgine  (LAMICTAL ) 25 MG tablet    Sig: Take 2 tablets (50 mg total) by mouth 2 (two) times daily.    Dispense:  120 tablet    Refill:  11    This is prescribed per neurology    Supervising Provider:   YAN, YIJUN [[id number]]      Duwaine Russell, MSN, NP-C 04/25/2024, 10:30 AM [facility] [address] [place], [place] [address] [phone]

## 2024-04-25 NOTE — Patient Instructions (Signed)
 Your Plan:  Increase Lamictal  to 50 mg twice a day     Thank you for coming to see us  at [facility]. I hope we have been able to provide you high quality care today.  You may receive a patient satisfaction survey over the next few weeks. We would appreciate your feedback and comments so that we may continue to improve ourselves and the health of our patients.

## 2024-05-06 ENCOUNTER — Encounter: Payer: Self-pay | Admitting: Adult Health

## 2024-05-13 ENCOUNTER — Other Ambulatory Visit: Payer: Self-pay | Admitting: Internal Medicine

## 2024-05-20 ENCOUNTER — Telehealth: Payer: Self-pay

## 2024-05-20 DIAGNOSIS — R739 Hyperglycemia, unspecified: Secondary | ICD-10-CM

## 2024-05-20 DIAGNOSIS — Z79899 Other long term (current) drug therapy: Secondary | ICD-10-CM

## 2024-05-20 DIAGNOSIS — R7989 Other specified abnormal findings of blood chemistry: Secondary | ICD-10-CM

## 2024-05-20 NOTE — Telephone Encounter (Signed)
-----   Message from Apolinar Eastern, MD sent at 05/19/2024 10:07 AM EST ----- K tell  patient I reviewed her  record and labs done.   I would like her to have bmp and urine microalbumin ratio and hg A1c to follow  her blood sugars  not diabetes but hyperglycemia ( not done recently)   and kidney function and vit d level ( has been low in past)  because  should be followed and not sure  if will be done elsewhere ,  please place future orders for vit d , bmp urin microalbumin and hg A1c  .   can be done in 4-6 months  and don't have to fast.    Thanks

## 2024-05-20 NOTE — Telephone Encounter (Signed)
 Attempted to reach pt. Left a voicemail to call us  back.   Lab orders are placed.

## 2024-05-24 NOTE — Telephone Encounter (Signed)
 Pt has lab appt

## 2024-05-27 ENCOUNTER — Ambulatory Visit: Payer: Self-pay | Admitting: Internal Medicine

## 2024-05-27 ENCOUNTER — Other Ambulatory Visit

## 2024-05-27 DIAGNOSIS — R739 Hyperglycemia, unspecified: Secondary | ICD-10-CM | POA: Diagnosis not present

## 2024-05-27 DIAGNOSIS — R7989 Other specified abnormal findings of blood chemistry: Secondary | ICD-10-CM

## 2024-05-27 DIAGNOSIS — Z79899 Other long term (current) drug therapy: Secondary | ICD-10-CM

## 2024-05-27 LAB — BASIC METABOLIC PANEL WITH GFR
BUN: 36 mg/dL — ABNORMAL HIGH (ref 6–23)
CO2: 34 meq/L — ABNORMAL HIGH (ref 19–32)
Calcium: 9.8 mg/dL (ref 8.4–10.5)
Chloride: 99 meq/L (ref 96–112)
Creatinine, Ser: 1.27 mg/dL — ABNORMAL HIGH (ref 0.40–1.20)
GFR: 38.74 mL/min — ABNORMAL LOW
Glucose, Bld: 96 mg/dL (ref 70–99)
Potassium: 4.1 meq/L (ref 3.5–5.1)
Sodium: 139 meq/L (ref 135–145)

## 2024-05-27 LAB — MICROALBUMIN / CREATININE URINE RATIO
Creatinine,U: 22.4 mg/dL
Microalb Creat Ratio: UNDETERMINED mg/g (ref 0.0–30.0)
Microalb, Ur: <0.7 mg/dL

## 2024-05-27 LAB — HEMOGLOBIN A1C: Hgb A1c MFr Bld: 5.7 % (ref 4.6–6.5)

## 2024-05-27 LAB — VITAMIN D 25 HYDROXY (VIT D DEFICIENCY, FRACTURES): VITD: 49.37 ng/mL (ref 30.00–100.00)

## 2024-05-27 NOTE — Progress Notes (Signed)
 Kidney function is slightly better   still decreased   no excess protein in urine favorable information. Vit d normal range  A1c is 5.7 borderline  prediabetic but no diabetes advise moderation with sugars .

## 2024-10-15 ENCOUNTER — Inpatient Hospital Stay: Admitting: Nurse Practitioner

## 2024-10-15 ENCOUNTER — Inpatient Hospital Stay

## 2025-02-17 ENCOUNTER — Ambulatory Visit: Admitting: Adult Health
